# Patient Record
Sex: Male | Born: 1975 | Hispanic: No | Marital: Married | Smoking: Former smoker
Health system: Southern US, Community
[De-identification: ages and names within clinical notes are randomized; demographics above are authoritative.]

## PROBLEM LIST (undated history)

## (undated) DIAGNOSIS — E1165 Type 2 diabetes mellitus with hyperglycemia: Secondary | ICD-10-CM

## (undated) DIAGNOSIS — E785 Hyperlipidemia, unspecified: Secondary | ICD-10-CM

## (undated) DIAGNOSIS — Z5189 Encounter for other specified aftercare: Secondary | ICD-10-CM

## (undated) DIAGNOSIS — B3749 Other urogenital candidiasis: Secondary | ICD-10-CM

## (undated) DIAGNOSIS — N289 Disorder of kidney and ureter, unspecified: Secondary | ICD-10-CM

## (undated) DIAGNOSIS — E119 Type 2 diabetes mellitus without complications: Secondary | ICD-10-CM

## (undated) DIAGNOSIS — I10 Essential (primary) hypertension: Secondary | ICD-10-CM

## (undated) HISTORY — DX: Hyperlipidemia, unspecified: E78.5

## (undated) HISTORY — DX: Other urogenital candidiasis: B37.49

## (undated) HISTORY — DX: Essential (primary) hypertension: I10

## (undated) HISTORY — PX: OTHER SURGICAL HISTORY: SHX169

## (undated) HISTORY — PX: APPENDECTOMY: SHX54

## (undated) HISTORY — PX: ROTATOR CUFF REPAIR: SHX139

## (undated) HISTORY — DX: Type 2 diabetes mellitus without complications: E11.9

## (undated) HISTORY — DX: Type 2 diabetes mellitus with hyperglycemia: E11.65

---

## 2006-05-06 ENCOUNTER — Emergency Department (HOSPITAL_COMMUNITY): Admission: EM | Admit: 2006-05-06 | Discharge: 2006-05-06 | Payer: Self-pay | Admitting: Emergency Medicine

## 2006-06-30 ENCOUNTER — Encounter: Payer: Self-pay | Admitting: Pulmonary Disease

## 2006-07-08 ENCOUNTER — Ambulatory Visit: Payer: Self-pay | Admitting: Pulmonary Disease

## 2007-04-29 ENCOUNTER — Emergency Department (HOSPITAL_COMMUNITY): Admission: EM | Admit: 2007-04-29 | Discharge: 2007-04-29 | Payer: Self-pay | Admitting: Emergency Medicine

## 2007-06-23 ENCOUNTER — Emergency Department (HOSPITAL_COMMUNITY): Admission: EM | Admit: 2007-06-23 | Discharge: 2007-06-24 | Payer: Self-pay | Admitting: Emergency Medicine

## 2007-08-27 ENCOUNTER — Emergency Department (HOSPITAL_COMMUNITY): Admission: EM | Admit: 2007-08-27 | Discharge: 2007-08-28 | Payer: Self-pay | Admitting: Emergency Medicine

## 2007-12-15 ENCOUNTER — Encounter: Payer: Self-pay | Admitting: Family Medicine

## 2007-12-15 DIAGNOSIS — J309 Allergic rhinitis, unspecified: Secondary | ICD-10-CM | POA: Insufficient documentation

## 2007-12-15 DIAGNOSIS — R5381 Other malaise: Secondary | ICD-10-CM | POA: Insufficient documentation

## 2008-01-24 ENCOUNTER — Emergency Department (HOSPITAL_COMMUNITY): Admission: EM | Admit: 2008-01-24 | Discharge: 2008-01-24 | Payer: Self-pay | Admitting: Emergency Medicine

## 2008-06-30 ENCOUNTER — Emergency Department (HOSPITAL_COMMUNITY): Admission: EM | Admit: 2008-06-30 | Discharge: 2008-07-01 | Payer: Self-pay | Admitting: Emergency Medicine

## 2008-12-08 ENCOUNTER — Ambulatory Visit: Payer: Self-pay | Admitting: Pulmonary Disease

## 2008-12-08 DIAGNOSIS — E119 Type 2 diabetes mellitus without complications: Secondary | ICD-10-CM

## 2008-12-08 DIAGNOSIS — I10 Essential (primary) hypertension: Secondary | ICD-10-CM | POA: Insufficient documentation

## 2008-12-08 DIAGNOSIS — G4733 Obstructive sleep apnea (adult) (pediatric): Secondary | ICD-10-CM | POA: Insufficient documentation

## 2008-12-08 DIAGNOSIS — E1165 Type 2 diabetes mellitus with hyperglycemia: Secondary | ICD-10-CM | POA: Insufficient documentation

## 2008-12-08 HISTORY — DX: Type 2 diabetes mellitus without complications: E11.9

## 2008-12-08 HISTORY — DX: Essential (primary) hypertension: I10

## 2009-03-02 DIAGNOSIS — E1142 Type 2 diabetes mellitus with diabetic polyneuropathy: Secondary | ICD-10-CM | POA: Insufficient documentation

## 2009-03-11 ENCOUNTER — Telehealth: Payer: Self-pay | Admitting: Pulmonary Disease

## 2009-03-17 ENCOUNTER — Ambulatory Visit: Payer: Self-pay | Admitting: Family Medicine

## 2009-03-17 DIAGNOSIS — B3749 Other urogenital candidiasis: Secondary | ICD-10-CM | POA: Insufficient documentation

## 2009-03-17 DIAGNOSIS — IMO0001 Reserved for inherently not codable concepts without codable children: Secondary | ICD-10-CM

## 2009-03-17 DIAGNOSIS — E785 Hyperlipidemia, unspecified: Secondary | ICD-10-CM | POA: Insufficient documentation

## 2009-03-17 HISTORY — DX: Reserved for inherently not codable concepts without codable children: IMO0001

## 2009-03-17 HISTORY — DX: Hyperlipidemia, unspecified: E78.5

## 2009-03-17 HISTORY — DX: Other urogenital candidiasis: B37.49

## 2009-04-15 ENCOUNTER — Telehealth (INDEPENDENT_AMBULATORY_CARE_PROVIDER_SITE_OTHER): Payer: Self-pay | Admitting: *Deleted

## 2009-07-23 ENCOUNTER — Emergency Department (HOSPITAL_COMMUNITY): Admission: EM | Admit: 2009-07-23 | Discharge: 2009-07-23 | Payer: Self-pay | Admitting: Family Medicine

## 2010-03-02 ENCOUNTER — Telehealth: Payer: Self-pay | Admitting: Family Medicine

## 2010-03-06 ENCOUNTER — Telehealth: Payer: Self-pay | Admitting: Family Medicine

## 2010-03-24 ENCOUNTER — Ambulatory Visit: Payer: Self-pay | Admitting: Family Medicine

## 2010-03-28 ENCOUNTER — Telehealth: Payer: Self-pay | Admitting: Family Medicine

## 2010-03-28 ENCOUNTER — Telehealth (INDEPENDENT_AMBULATORY_CARE_PROVIDER_SITE_OTHER): Payer: Self-pay | Admitting: *Deleted

## 2010-03-31 ENCOUNTER — Telehealth: Payer: Self-pay | Admitting: Family Medicine

## 2010-04-04 ENCOUNTER — Telehealth: Payer: Self-pay | Admitting: Family Medicine

## 2010-04-06 ENCOUNTER — Ambulatory Visit: Payer: Self-pay | Admitting: Family Medicine

## 2010-04-18 ENCOUNTER — Telehealth: Payer: Self-pay | Admitting: Pulmonary Disease

## 2010-04-20 ENCOUNTER — Encounter: Payer: Self-pay | Admitting: Pulmonary Disease

## 2010-04-24 ENCOUNTER — Encounter: Payer: Self-pay | Admitting: Family Medicine

## 2010-06-01 ENCOUNTER — Ambulatory Visit (HOSPITAL_BASED_OUTPATIENT_CLINIC_OR_DEPARTMENT_OTHER): Admission: RE | Admit: 2010-06-01 | Discharge: 2010-06-01 | Payer: Self-pay | Admitting: Urology

## 2010-06-02 ENCOUNTER — Ambulatory Visit: Payer: Self-pay | Admitting: Family Medicine

## 2010-06-02 LAB — CONVERTED CEMR LAB
ALT: 24 units/L (ref 0–53)
AST: 35 units/L (ref 0–37)
Albumin: 3.6 g/dL (ref 3.5–5.2)
Alkaline Phosphatase: 99 units/L (ref 39–117)
Cholesterol: 236 mg/dL — ABNORMAL HIGH (ref 0–200)
Creatinine, Ser: 0.8 mg/dL (ref 0.4–1.5)
Direct LDL: 119.8 mg/dL
GFR calc non Af Amer: 122.78 mL/min (ref >60)
Glucose, Bld: 289 mg/dL — ABNORMAL HIGH (ref 70–99)
Sodium: 136 meq/L (ref 135–145)
Total CHOL/HDL Ratio: 7

## 2010-06-08 ENCOUNTER — Encounter: Payer: Self-pay | Admitting: Family Medicine

## 2010-06-12 ENCOUNTER — Ambulatory Visit: Payer: Self-pay | Admitting: Family Medicine

## 2010-06-12 LAB — HM DIABETES FOOT EXAM

## 2010-06-29 ENCOUNTER — Ambulatory Visit: Payer: Self-pay | Admitting: Endocrinology

## 2010-07-20 ENCOUNTER — Encounter: Payer: Self-pay | Admitting: Family Medicine

## 2010-07-28 ENCOUNTER — Ambulatory Visit: Payer: Self-pay | Admitting: Endocrinology

## 2010-12-19 NOTE — Letter (Signed)
Summary: [facility]  [facility]   Imported By: Lanelle Bal 07/27/2010 12:12:12  _____________________________________________________________________  External Attachment:    Type:   Image     Comment:   External Document

## 2010-12-19 NOTE — Consult Note (Signed)
Summary: [facility]  [facility]   Imported By: Maryln Gottron 05/01/2010 14:21:44  _____________________________________________________________________  External Attachment:    Type:   Image     Comment:   External Document

## 2010-12-19 NOTE — Progress Notes (Signed)
Summary: nos appt  Phone Note Call from Patient   Caller: [email or web address]  Call For: clance Summary of Call: LMTCB x2 to rsch nos from 5/27. Initial call taken by: Darletta Moll,  Apr 18, 2010 3:11 PM

## 2010-12-19 NOTE — Progress Notes (Signed)
Summary: cpap questions   [id number]  Phone Note Call from Patient Call back at [phone]   Caller: wife-jessica Call For: clance Summary of Call: has question about his cpap and also needs rx for his medical supplies Initial call taken by: Lacinda Axon,  Mar 28, 2010 8:14 AM  Follow-up for Phone Call        [id number].  FYI...Marland KitchenMarland KitchenPt needs ov with KC before he will sign any rx for supplies .  pt was last seen Jan 2010.  Arman Filter LPN  Mar 28, 2010 8:57 AM   called and spoke with pt's wife.  wife scheduled pt for yearly f/u appt with kc for 04/14/2010 at 3:45pm.   Wife also states that last year when pt was seen in Jan, KC had ordered an Soil scientist.  Wife states she had called multiple times for these results and never heard back from [place].  Wife would like the auto download results.  Wife also would like an order sent to [facility] for cpap supplies.  Will forward message to [facility].  Aundra Millet Reynolds LPN  Mar 28, 2010 9:25 AM ** I called [facility] and got download from 2010 and put in [place] very important look at folder.   Additional Follow-up for Phone Call Additional follow up Details #1::        let pt know that we never received these results until now.  We had called ourselves to get them sent with no success.  let them know download shows optimal pressure of 12-13 cm of pressure, and that he wore for 4 hours or more only 50% of the nights monitored. will send order for supplies to [facility], but they MUST keep f/u apptm with me.  Additional Follow-up by: Barbaraann Share MD,  Mar 28, 2010 11:56 AM    Additional Follow-up for Phone Call Additional follow up Details #2::    called and spoke with pt's spouse, [name], and informed her of KC's response and recs.  Wife verbalized understanding and denied any questions.  Arman Filter LPN  Mar 28, 2010 12:10 PM

## 2010-12-19 NOTE — Assessment & Plan Note (Signed)
Summary: 2 month rov/njr/PT RESCD///CCM wife rsc/njr   Vital Signs:  Patient profile:   35 year old male Weight:      244 pounds Temp:     98.0 degrees F oral BP sitting:   110 / 80  (left arm) Cuff size:   large  Vitals Entered By: Kathrynn Speed CMA (June 12, 2010 4:35 PM) CC: 2 mth fu on BS, src   History of Present Illness: Patient has diabetes poorly controlled. He is currently on NovoLog mix 70/30 100 units in morning and 60 units at night. His blood sugar seemed to level off with fastings ranging 210-225 and pre-dinner sugars also mostly low 200s. He has had some improvement in terms of less urine frequency and thirst. No hypoglycemic symptoms. Patient also remains on metformin 1000 mg b.i.d. He has made some positive dietary changes.    Compared with few months ago he feels better overall but is somewhat frustrated that his blood sugars are better controlled on higher dose insulin.  No hypoglycemia.  Compliant with all meds.  Recent labs reviewed-A1C 11.8% and lipids are not to goal.  Surgery 2 weeks ago for circumcision for recurrent yeast balanitits and healing well.  Current Medications (verified): 1)  Meloxicam 15 Mg Tabs (Meloxicam) .... Take 1 Tablet By Mouth Once A Day 2)  Pravastatin Sodium 40 Mg Tabs (Pravastatin Sodium) .... Take 1 Tablet By Mouth Once A Day 3)  Metformin Hcl 1000 Mg Tabs (Metformin Hcl) .... One Tab Two Times A Day 4)  Cvs Aspirin 325 Mg Tabs (Aspirin) .... Once Daily 5)  Nystatin 100000 Unit/gm Crea (Nystatin) .... Apply To Affected Rash Two Times A Day As Needed 6)  Lisinopril-Hydrochlorothiazide 10-12.5 Mg Tabs (Lisinopril-Hydrochlorothiazide) .... One By Mouth Daily 7)  Easy Touch Pen Needles 31g X 8 Mm Misc (Insulin Pen Needle) .... Use As Directed Once Daily 8)  Novolog Mix 70/30 Flexpen 70-30 % Susp (Insulin Aspart Prot & Aspart) .... 70 Units [place] 15 Minutes Prior To Breakfast and 35 Units [place] 15 Minutes Prior To Supper  Allergies  (verified): No Known Drug Allergies  Past History:  Past Surgical History: Last updated: 03/17/2009 Appendectomy  Family History: Last updated: 03/17/2009 heart disease: father  Family History of Arthritis Family History High cholesterol Family History Hypertension Diabetes Heart disease Breast cancer  Social History: Last updated: 03/17/2009 pt is married with children. 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    Physical Exam  General:  Well-developed,well-nourished,in no acute distress; alert,appropriate and cooperative throughout examination Mouth:  Oral mucosa and oropharynx without lesions or exudates.  Teeth in good repair. Neck:  No deformities, masses, or tenderness noted. Lungs:  Normal respiratory effort, chest expands symmetrically. Lungs are clear to auscultation, no crackles or wheezes. Heart:  Normal rate and regular rhythm. S1 and S2 normal without gallop, murmur, click, rub or other extra sounds.  Diabetes Management Exam:    Foot Exam (with socks and/or shoes not present):       Sensory-Pinprick/Light touch:          Left medial foot (L-4): normal  Left dorsal foot (L-5): normal          Left lateral foot (S-1): normal          Right medial foot (L-4): normal          Right dorsal foot (L-5): normal          Right lateral foot (S-1): normal       Sensory-Monofilament:          Left foot: normal          Right foot: normal       Inspection:           Left foot: normal          Right foot: normal       Nails:          Left foot: normal          Right foot: normal   Impression & Recommendations:  Problem # 1:  DIABETES MELLITUS, TYPE II, UNCONTROLLED (ICD-250.02)  at this point have recommended endocrinology referral given his high dose of insulin and poor control. He agrees with this plan His updated medication list for this problem includes:    Metformin Hcl 1000 Mg Tabs (Metformin hcl) ..... One tab two times a day    Cvs Aspirin 325 Mg Tabs (Aspirin) ..... Once daily    Lisinopril-hydrochlorothiazide 10-12.5 Mg Tabs (Lisinopril-hydrochlorothiazide) ..... One by mouth daily    Novolog Mix 70/30 Flexpen 70-30 % Susp (Insulin aspart prot & aspart) .Marland KitchenMarland KitchenMarland KitchenMarland Kitchen 70 units [place] 15 minutes prior to breakfast and 35 units [place] 15 minutes prior to supper  Orders: Endocrinology Referral (Endocrine)  Problem # 2:  HYPERLIPIDEMIA (ICD-272.4) titrate pravastatin to 80 mg His updated medication list for this problem includes:    Pravastatin Sodium 80 Mg Tabs (Pravastatin sodium) ..... One by mouth once daily  Complete Medication List: 1)  Meloxicam 15 Mg Tabs (Meloxicam) .... Take 1 tablet by mouth once a day 2)  Pravastatin Sodium 80 Mg Tabs (Pravastatin sodium) .... One by mouth once daily 3)  Metformin Hcl 1000 Mg Tabs (Metformin hcl) .... One tab two times a day 4)  Cvs Aspirin 325 Mg Tabs (Aspirin) .... Once daily 5)  Nystatin 100000 Unit/gm Crea (Nystatin) .... Apply to affected rash two times a day as needed 6)  Lisinopril-hydrochlorothiazide 10-12.5 Mg Tabs (Lisinopril-hydrochlorothiazide) .... One by mouth daily 7)  Easy Touch Pen Needles 31g X 8 Mm Misc (Insulin pen needle) .... Use as directed once daily 8)  Novolog Mix 70/30 Flexpen 70-30 % Susp (Insulin aspart prot & aspart) .... 70 units [place] 15 minutes prior to breakfast and 35 units [place] 15 minutes prior to supper  Patient Instructions: 1)  Please schedule a follow-up appointment in 3  months .  2)  Check your blood sugars regularly. If your readings are usually above:200  or below 70 you should contact our office.  3)  It is important that your diabetic A1c level is checked every 3 months.  4)  See your eye doctor yearly to check for diabetic eye damage. 5)  Check your feet each night  for sore areas, calluses or signs of infection.  Prescriptions: PRAVASTATIN SODIUM 80 MG TABS (PRAVASTATIN SODIUM) one by mouth once daily  #30 x 6   Entered and Authorized by:   Evelena Peat MD   Signed by:   Evelena Peat MD on 06/12/2010   Method used:   Electronically to  [facility]ail[facility]       [address]       [place], [place]  [address]       Ph: [phone]       Fax: [phone]   RxID:   [id number]

## 2010-12-19 NOTE — Progress Notes (Signed)
Summary: Pt req script for [facility]antus [facility] to [facility] [address]  Phone Note Call from Patient Call back at [phone] Jessica   Caller: spouse-Jessica Summary of Call: Pts wife called and said that pts script for his insulin, Lantus pen, has run out. Please call in to [facility] [address].     Initial call taken by: Lucy Ant[place],  March 02, 2010 3:40 PM  Follow-up for Phone Call        Spoke with wife, explained her husband has not seen Dr Caryl Never in a year, was a no show for his 1 month follow-up.  Pt is totally out of Lantus.  She explained they lost their insurance.  His BS have been running 300 and he is taking 70 units two times a day.  He is totally out of insulin.  Wife is waiting insurance cards to schedule OV as they recently are eligable for insurance again. Follow-up by: Sid Falcon LPN,  March 02, 2010 4:49 PM  Additional Follow-up for Phone Call Additional follow up Details #1::        Wife informed Lantus was sent to [facility], please call to schedule OV for husband. FYI Dr Caryl Never Additional Follow-up by: Sid Falcon LPN,  March 02, 2010 4:55 PM    Prescriptions: LANTUS 100 UNIT/ML SOLN (INSULIN GLARGINE) 20 units at bedtime  #3 x 0   Entered by:   Sid Falcon LPN   Authorized by:   Evelena Peat MD   Signed by:   Sid Falcon LPN on 62/95/2841   Method used:   Electronically to        [facility] (retail)       [address]       [place], [place]  [address]       Ph: [phone]       Fax: [phone]   RxID:   [id number]

## 2010-12-19 NOTE — Letter (Signed)
Summary: [facility]  [facility]   Imported By: Maryln Gottron 06/14/2010 09:51:54  _____________________________________________________________________  External Attachment:    Type:   Image     Comment:   External Document

## 2010-12-19 NOTE — Assessment & Plan Note (Signed)
Summary: NEW ENDO DMII-MED COST PT-#--PKG-PER RHONDA/DR BURCHETTE-STC   Vital Signs:  Patient profile:   35 year old male Height:      70 inches (177.80 cm) Weight:      248.50 pounds (112.95 kg) BMI:     35.78 O2 Sat:      97 % on Room air Temp:     97.3 degrees F (36.28 degrees C) oral Pulse rate:   88 / minute BP sitting:   130 / 70  (left arm) Cuff size:   large  Vitals Entered By: Brenton Grills MA (June 29, 2010 2:26 PM)  O2 Flow:  Room air CC: New Endo pt/DMII/Dr. Burchette/aj Is Patient Diabetic? Yes   Primary Delle Andrzejewski:  Elizabeth Palau  CC:  New Endo pt/DMII/Dr. Burchette/aj.  History of Present Illness: pt states 4 years h/o dm.  he is unaware of any chronic complications.  he has been on insulin x 18 mos.  he takes novolog 70/30, 100 units two times a day, and metformin.  no cbg record, but states cbg's are 200's despite the increases of his insulin.    it is in general higher as the day goes on. pt says his diet and exercise are both "good."   symptomatically, pt states few years of recurrent itching of the penis, and associated rash.  it is better with circumcision.   Current Medications (verified): 1)  Meloxicam 15 Mg Tabs (Meloxicam) .... Take 1 Tablet By Mouth Once A Day 2)  Pravastatin Sodium 80 Mg Tabs (Pravastatin Sodium) .... One By Mouth Once Daily 3)  Metformin Hcl 1000 Mg Tabs (Metformin Hcl) .... One Tab Two Times A Day 4)  Cvs Aspirin 325 Mg Tabs (Aspirin) .... Once Daily 5)  Nystatin 100000 Unit/gm Crea (Nystatin) .... Apply To Affected Rash Two Times A Day As Needed 6)  Lisinopril-Hydrochlorothiazide 10-12.5 Mg Tabs (Lisinopril-Hydrochlorothiazide) .... One By Mouth Daily 7)  Easy Touch Pen Needles 31g X 8 Mm Misc (Insulin Pen Needle) .... Use As Directed Once Daily 8)  Novolog Mix 70/30 Flexpen 70-30 % Susp (Insulin Aspart Prot & Aspart) .... 70 Units [place] 15 Minutes Prior To Breakfast and 35 Units [place] 15 Minutes Prior To Supper  Allergies  (verified): No Known Drug Allergies  Past History:  Past Medical History: Last updated: 06/12/2010 Current Problems:  OBSTRUCTIVE SLEEP APNEA (ICD-327.23) DM (ICD-250.00) HYPERTENSION (ICD-401.9)  HYPERLIPIDEMIA  Family History: Reviewed history from 03/17/2009 and no changes required. heart disease: father  Family History of Arthritis Family History High cholesterol Family History Hypertension Diabetes:  both parents Heart disease Breast cancer  Social History: Reviewed history from 03/17/2009 and no changes required. pt is married with children. 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  he has lost 75 lbs x 2 years.   Physical Exam  General:  normal appearance.   Head:  head: no deformity eyes: no periorbital swelling, no proptosis external nose and ears are normal mouth: no lesion seen Neck:  Supple without thyroid enlargement or tenderness.  Lungs:  Clear to auscultation bilaterally. Normal respiratory effort.  Heart:  Regular rate and rhythm without murmurs or gallops noted. Normal S1,S2.   Abdomen:  abdomen is soft, nontender.  no hepatosplenomegaly.   not distended.  no hernia  Msk:  muscle bulk and strength are grossly normal.  no obvious joint swelling.  gait is normal and steady  Pulses:  dorsalis pedis intact bilat.  no carotid bruit  Extremities:  no deformity.  no ulcer on the feet.  feet are of normal color and temp.  no edema  Neurologic:  cn 2-12 grossly intact.   readily moves all 4's.   sensation is intact to touch on the feet  Skin:  normal texture and temp.  no rash.  not diaphoretic  Cervical Nodes:  No significant adenopathy.  Psych:  Alert and cooperative; normal mood and affect; normal attention span and concentration.      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 125 units with breakfast, and 100 untis with evening meal, and pen needles two times a day  Other Orders: Consultation Level IV (16[address])  Patient Instructions: 1)  good diet and exercise habits significanly improve the control of your diabetes.  please let me know if you wish to be referred to a dietician.  you should also consider weight-loss surgery.  high blood sugar is very risky to your health.  you should see an eye doctor every year. 2)  controlling your blood pressure and cholesterol drastically reduces the damage diabetes does to your body.  this also applies to quitting smoking.  please discuss these with your doctor.  you should take an aspirin every day, unless you have been advised by a doctor not to. 3)  we will need to take this complex situation in stages 4)  check your blood sugar 2 times a day.  vary the time of day when you check, between before the 3 meals, and at bedtime.  also check if you have symptoms of your blood sugar being too high or too low.  please keep a record of the readings and bring it to your next appointment here.  please call us sooner if you are having low blood sugar episodes. 5)  for now, increase insulin to 125 units am and 100 units pm.  if blood sugar does not come down to the low-100's at some time of day on this, increase to 150 units am and 100 units pm. 6)  Please schedule a follow-up appointment in 3 weeks. Prescriptions: NOVOLOG MIX 70/30 FLEXPEN 70-30 % SUSP (INSULIN ASPART PROT & ASPART) 125 units with breakfast, and 100 untis with evening meal, and pen needles two times a day  #6 boxes x 11    Entered and Authorized by:   Minus Breeding MD   Signed by:   Minus Breeding MD on 06/29/2010   Method used:   Electronically to        [facility] [place] [facility] (retail)       [address]       [place], [place]  [address]       Ph: [phone]       Fax: [phone]   RxID:   [id number]

## 2010-12-19 NOTE — Assessment & Plan Note (Signed)
Summary: RENEW MEDS//CCM   Vital Signs:  Patient profile:   35 year old male Height:      70 inches Weight:      245 pounds BMI:     35.28 Temp:     98.0 degrees F oral Pulse rate:   96 / minute Pulse rhythm:   regular Resp:     12 per minute BP sitting:   140 / 90  (left arm) Cuff size:   large  Vitals Entered By: Sid Falcon LPN (Mar 24, 8118 4:09 PM)  Nutrition Counseling: Patient's BMI is greater than 25 and therefore counseled on weight management options. CC: Med refills Is Patient Diabetic? Yes Did you bring your meter with you today? No   History of Present Illness: Patient not seen in over one year. Ran out of insurance and recently has coverage.  History of poorly controlled type 2 diabetes, obstructive sleep apnea, hyperlipidemia, hypertension, and recurrent yeast balanitis.  Regarding diabetes, we started Actos last year but he only took samples for about a month and then discontinued. Currently taking glipizide and that form and along with Lantus 80 units twice daily still with poor sugar control with some fastings over 400 and frequent over 300. Frequent malaise. No hypoglycemia. Each regular meals. No eye exam.  History hyperlipidemia treated Pravachol. Out of bed for several months.  History hypertension treated lisinopril HCTZ. Out of medication for 2 weeks. Denies any recent chest pain, dizziness, or palpitations.  Recurrent yeast balanitis. Uncircumcised. Frequently has swelling of foreskin and frequently this splits with erection and patient is requesting urology referral.  Preventive Screening-Counseling & Management  Alcohol-Tobacco     Smoking Status: current     Packs/Day: 0.75     Year Started: 1997  Allergies: No Known Drug Allergies  Past History:  Past Medical History: Last updated: 03/17/2009 Current Problems:  OBSTRUCTIVE SLEEP APNEA (ICD-327.23) DM (ICD-250.00) HYPERTENSION (ICD-401.9)   Hyperlipidemia  Past Surgical  History: Last updated: 03/17/2009 Appendectomy  Family History: Last updated: 03/17/2009 heart disease: father  Family History of Arthritis Family History High cholesterol Family History Hypertension Diabetes Heart disease Breast cancer  Social History: Last updated: 03/17/2009 pt is married with children. Occupation:  Truck driver Married Current Smoker  Risk Factors: Smoking Status: current (03/24/2010) Packs/Day: 0.75 (03/24/2010)  Social History: Packs/Day:  0.75  Review of Systems  The patient denies anorexia, fever, weight loss, weight gain, chest pain, syncope, dyspnea on exertion, peripheral edema, prolonged cough, headaches, hemoptysis, abdominal pain, melena, hematochezia, severe indigestion/heartburn, and muscle weakness.    Physical Exam  General:  Well-developed,well-nourished,in no acute distress; alert,appropriate and cooperative throughout examination Neck:  No deformities, masses, or tenderness noted. Lungs:  Normal respiratory effort, chest expands symmetrically. Lungs are clear to auscultation, no crackles or wheezes. Heart:  Normal rate and regular rhythm. S1 and S2 normal without gallop, murmur, click, rub or other extra sounds. Extremities:  No clubbing, cyanosis, edema, or deformity noted with normal full range of motion of all joints.     Impression & Recommendations:  Problem # 1:  DIABETES MELLITUS, TYPE II, UNCONTROLLED (ICD-250.02) several med changes made. Discontinue glipizide. Start NovoLog 70/30 mix. 60 units morning 30 units at night and will likely need titrated upwards. Patient needs eye exam. Poor dietary compliance and we've discussed dietary issues. Transport planner given.  Monitor CBGs two times a day to three times a day and 2 week f/u and review at that time. The following medications were removed from  the medication list:    Glipizide 10 Mg Xr24h-tab (Glipizide) .Marland Kitchen... Take 1 tablet by mouth two times a day    Lantus  Solostar 100 Unit/ml Soln (Insulin glargine) .Marland Kitchen... 20 ml at bedtime    Actos 30 Mg Tabs (Pioglitazone hcl) ..... One by mouth daily His updated medication list for this problem includes:    Metformin Hcl 1000 Mg Tabs (Metformin hcl) ..... One tab two times a day    [facility] Aspirin 325 Mg Tabs (Aspirin) ..... Once daily    Lisinopril-hydrochlorothiazide 10-12.5 Mg Tabs (Lisinopril-hydrochlorothiazide) ..... One by mouth daily    Novolog Mix 70/30 Flexpen 70-30 % Susp (Insulin aspart prot & aspart) .Marland KitchenMarland KitchenMarland KitchenMarland Kitchen 60 units [place] 15 minutes prior to breakfast and 30 units [place] 15 minutes prior to supper  Problem # 2:  HYPERLIPIDEMIA (ICD-272.4) start back pravastatin and needs followup lipids in about 2 months His updated medication list for this problem includes:    Pravastatin Sodium 40 Mg Tabs (Pravastatin sodium) .Marland Kitchen... Take 1 tablet by mouth once a day  Problem # 3:  HYPERTENSION (ICD-401.9) get back on lisinopril HCTZ His updated medication list for this problem includes:    Lisinopril-hydrochlorothiazide 10-12.5 Mg Tabs (Lisinopril-hydrochlorothiazide) ..... One by mouth daily  Problem # 4:  YEAST BALANITIS (ICD-112.2)  patient request urology referral for consideration of circumcision  Orders: Urology Referral (Urology)  Complete Medication List: 1)  Meloxicam 15 Mg Tabs (Meloxicam) .... Take 1 tablet by mouth once a day 2)  Pravastatin Sodium 40 Mg Tabs (Pravastatin sodium) .... Take 1 tablet by mouth once a day 3)  Metformin Hcl 1000 Mg Tabs (Metformin hcl) .... One tab two times a day 4)  [facility] Aspirin 325 Mg Tabs (Aspirin) .... Once daily 5)  Nystatin 100000 Unit/gm Crea (Nystatin) .... Apply to affected rash two times a day as needed 6)  Lisinopril-hydrochlorothiazide 10-12.5 Mg Tabs (Lisinopril-hydrochlorothiazide) .... One by mouth daily 7)  Easy Touch Pen Needles 31g X 8 Mm Misc (Insulin pen needle) .... Use as directed once daily 8)  Novolog Mix 70/30 Flexpen 70-30 % Susp (Insulin aspart  prot & aspart) .... 60 units [place] 15 minutes prior to breakfast and 30 units [place] 15 minutes prior to supper  Patient Instructions: 1)  Please schedule a follow-up appointment in 2 weeks.  2)  Check your blood sugars regularly. If your readings are usually above: 300 or below 70 you should contact our office.  3)  It is important that your diabetic A1c level is checked every 3 months.  4)  See your eye doctor yearly to check for diabetic eye damage. 5)  Check your feet each night  for sore areas, calluses or signs of infection.  Prescriptions: LISINOPRIL-HYDROCHLOROTHIAZIDE 10-12.5 MG TABS (LISINOPRIL-HYDROCHLOROTHIAZIDE) one by mouth daily  #30 x 5   Entered and Authorized by:   Evelena Peat MD   Signed by:   Evelena Peat MD on 03/24/2010   Method used:   Electronically to        [facility] [address] [address] (retail)       [address]       [place], [place]  [address]       Ph: [phone]       Fax: [phone]   RxID:   [id number] METFORMIN HCL 1000 MG TABS (METFORMIN HCL) one tab two times a day  #60 x 5   Entered and Authorized by:   Evelena Peat MD  Signed by:   Evelena Peat MD on 03/24/2010   Method used:   Electronically to        [facility] ([facility])       [address]       [place], [place]  [address]       Ph: [phone]       Fax: [phone]   RxID:   [id number] PRAVASTATIN SODIUM 40 MG TABS (PRAVASTATIN SODIUM) Take 1 tablet by mouth once a day  #30 x 5   Entered and Authorized by:   Evelena Peat MD   Signed by:   Evelena Peat MD on 03/24/2010   Method used:   Electronically to        [facility] ([facility])       [address]       [place], [place]  [address]       Ph: [phone]       Fax: [phone]   RxID:   [id number] MELOXICAM 15 MG TABS (MELOXICAM) Take 1 tablet by mouth once a day  #30 x 1   Entered and Authorized by:   Evelena Peat MD   Signed by:   Evelena Peat MD on 03/24/2010   Method used:   Electronically to        [facility] [address] [address] (retail)       [address]       [place], [place]  [address]       Ph: [phone]       Fax: [phone]   RxID:   [id number] NOVOLOG MIX 70/30 FLEXPEN 70-30 % SUSP (INSULIN ASPART PROT & ASPART) 60 units [place] 15 minutes prior to breakfast and 30 units [place] 15 minutes prior to supper  #5 syringes x 5   Entered and Authorized by:   Evelena Peat MD   Signed by:   Evelena Peat MD on 03/24/2010   Method used:   Electronically to        [facility] ([facility])       [address]       [place], [place]  [address]       Ph: [phone]       Fax: [phone]   RxID:   [id number] NYSTATIN 100000 UNIT/GM CREA (NYSTATIN) apply to affected rash two times a day as needed  #30 gm x 3   Entered and Authorized by:   Evelena Peat MD   Signed by:   Evelena Peat MD on 03/24/2010   Method used:   Electronically to        [facility] ([facility])       [address]       [place], [place]  [address]       Ph: [phone]       Fax: [phone]   RxID:   [id number]

## 2010-12-19 NOTE — Progress Notes (Signed)
Summary: need Solarstar Pens  Phone Note Call from Patient Call back at [phone]   Caller: Spouse----live call Summary of Call: pt needs the Lantus Solarstar insulin  pens instead of the needles. call [facility] [address] Initial call taken by: Warnell Forester,  March 06, 2010 11:14 AM  Follow-up for Phone Call        Spoke to pt, changed for Lantus in vial to Lantus solarstar pens and needles ordered. Follow-up by: Sid Falcon LPN,  March 06, 2010 12:47 PM    New/Updated Medications: LANTUS SOLOSTAR 100 UNIT/ML SOLN (INSULIN GLARGINE) 20 ml at bedtime EASY TOUCH PEN NEEDLES 31G X 8 MM MISC (INSULIN PEN NEEDLE) use as directed once daily Prescriptions: EASY TOUCH PEN NEEDLES 31G X 8 MM MISC (INSULIN PEN NEEDLE) use as directed once daily  #100 x 3   Entered by:   Sid Falcon LPN   Authorized by:   Evelena Peat MD   Signed by:   Sid Falcon LPN on 45/40/9811   Method used:   Electronically to        [facility] ([facility])       [address]       [place], [place]  [address]       Ph: [phone]       Fax: [phone]   RxID:   [id number] LANTUS SOLOSTAR 100 UNIT/ML SOLN (INSULIN GLARGINE) 20 ml at bedtime  #3 x 6   Entered by:   Sid Falcon LPN   Authorized by:   Evelena Peat MD   Signed by:   Sid Falcon LPN on 66/44/0347   Method used:   Electronically to        [facility] ([facility])       [address]       [place], [place]  [address]       Ph: [phone]       Fax: [phone]   RxID:   [id number]

## 2010-12-19 NOTE — Progress Notes (Signed)
Summary: BS report  Phone Note Call from Patient   Caller: Elyse Hsu [phone] Call For: Evelena Peat MD Summary of Call: BS report 5/10 FBS 227, AC 283 5/11 FBS 268, AC 193 5/12 FBS 230, AC 329 5/13 FBS 217 Initial call taken by: Sid Falcon LPN,  Mar 31, 2010 9:38 AM  Follow-up for Phone Call        Sugars reviewed.   Increase 70/30 insulin to 75 units AM and 40 units PM.  Make sure pt has scheduled f/u next couple of weeks to reassess. Follow-up by: Evelena Peat MD,  Mar 31, 2010 1:16 PM  Additional Follow-up for Phone Call Additional follow up Details #1::        Wife informed, pt scheduled next week for F/U Additional Follow-up by: Sid Falcon LPN,  Mar 31, 2010 1:41 PM

## 2010-12-19 NOTE — Progress Notes (Signed)
Summary: BS report  Phone Note Call from Patient   Caller: wife,jessica,[phone] Summary of Call: 5-7 FBS 324 pm, ac dinner 380 on 60units am & 30units pm, 5-8 FBS 238 & ac dinner 184 on 62 units am & 30 pm, 5-9 FBS 292 & ac dinner 357 & 3hr pc 239 on 62 units am & 32 pm, 5-10 FBS 227 on 66 units.    Initial call taken by: Rudy Jew, RN,  Mar 28, 2010 8:07 AM  Follow-up for Phone Call        Go ahead and increase 70/30 mix to 70 units AM and 35 units PM. Follow-up by: Evelena Peat MD,  Mar 28, 2010 10:00 AM  Additional Follow-up for Phone Call Additional follow up Details #1::        Phone Call Completed Additional Follow-up by: Rudy Jew, RN,  Mar 28, 2010 10:32 AM    New/Updated Medications: NOVOLOG MIX 70/30 FLEXPEN 70-30 % SUSP (INSULIN ASPART PROT & ASPART) 70 units [place] 15 minutes prior to breakfast and 35 units [place] 15 minutes prior to supper

## 2010-12-19 NOTE — Letter (Signed)
Summary: CMN request for CPAP Supplies/[facility]  CMN request for CPAP Supplies/[facility]   Imported By: Sherian Rein 04/28/2010 11:24:21  _____________________________________________________________________  External Attachment:    Type:   Image     Comment:   External Document

## 2010-12-19 NOTE — Progress Notes (Signed)
Summary: please return call, reporting BS  Phone Note Call from Patient Call back at [phone]   Caller: Spouse---live call Call For: Evelena Peat MD Summary of Call: Waldo Laine to call back for bs readings: 5:14 am was 247 on 75 units of the new insulin. on 5:15 pm 218 with 40 units. 5:16 am 296 on 75 units , 5:16 pm was 216 on 40 units ,5;16 am was 200 on 75 units. the readings were recorded starting on may 14 th. Initial call taken by: Warnell Forester,  Apr 04, 2010 8:10 AM  Follow-up for Phone Call        Spoke with pt wife, BS not really improving with Insulin increase.  Pt has OV on Thursday afternoon.  Follow-up by: Sid Falcon LPN,  Apr 04, 2010 3:40 PM  Additional Follow-up for Phone Call Additional follow up Details #1::        continue same dose until office follow up. Additional Follow-up by: Evelena Peat MD,  Apr 04, 2010 10:13 PM    Additional Follow-up for Phone Call Additional follow up Details #2::    Mrs Hedman informed, OV tomorrow Follow-up by: Sid Falcon LPN,  Apr 05, 2010 11:36 AM

## 2010-12-19 NOTE — Assessment & Plan Note (Signed)
Summary: 2 wk rov/njr   Vital Signs:  Patient profile:   35 year old male Weight:      248 pounds Temp:     98 degrees F oral BP sitting:   120 / 90  (left arm) Cuff size:   large  Vitals Entered By: Sid Falcon LPN (Apr 06, 2010 4:48 PM) CC: 2 week follow-up, diabetes management   History of Present Illness: F/U poorly controlled type 2 diabetes. Recent initiation insulin NovoLog 70/30 mix.  patient currently 70 units morning 40 units night. Blood sugars had been ranging over 400 currently mostly low 200s and occasional readings mid 100s. No hypoglycemia. Remains on metformin 1000 mg b.i.d. Symptoms of hyperglycemia including thirst and urine frequency are improving. Overall improved energy.  Making important dietary changes. Has reduced sugar consumption mostly in the form of colas since last visit.  Allergies (verified): No Known Drug Allergies  Past History:  Past Medical History: Last updated: 03/17/2009 Current Problems:  OBSTRUCTIVE SLEEP APNEA (ICD-327.23) DM (ICD-250.00) HYPERTENSION (ICD-401.9)   Hyperlipidemia  Review of Systems  The patient denies anorexia, fever, chest pain, syncope, dyspnea on exertion, peripheral edema, prolonged cough, headaches, hemoptysis, and abdominal pain.    Physical Exam  General:  Well-developed,well-nourished,in no acute distress; alert,appropriate and cooperative throughout examination Mouth:  Oral mucosa and oropharynx without lesions or exudates.  Teeth in good repair. Lungs:  Normal respiratory effort, chest expands symmetrically. Lungs are clear to auscultation, no crackles or wheezes. Heart:  Normal rate and regular rhythm. S1 and S2 normal without gallop, murmur, click, rub or other extra sounds.   Impression & Recommendations:  Problem # 1:  DIABETES MELLITUS, TYPE II, UNCONTROLLED (ICD-250.02) Assessment Improved titrate NovoLog mix to 75 units morning 45 units night and continue close monitoring. Reassess 2  months and repeat A1c with other labs and then His updated medication list for this problem includes:    Metformin Hcl 1000 Mg Tabs (Metformin hcl) ..... One tab two times a day    [facility] Aspirin 325 Mg Tabs (Aspirin) ..... Once daily    Lisinopril-hydrochlorothiazide 10-12.5 Mg Tabs (Lisinopril-hydrochlorothiazide) ..... One by mouth daily    Novolog Mix 70/30 Flexpen 70-30 % Susp (Insulin aspart prot & aspart) .Marland KitchenMarland KitchenMarland KitchenMarland Kitchen 70 units [place] 15 minutes prior to breakfast and 35 units [place] 15 minutes prior to supper  Complete Medication List: 1)  Meloxicam 15 Mg Tabs (Meloxicam) .... Take 1 tablet by mouth once a day 2)  Pravastatin Sodium 40 Mg Tabs (Pravastatin sodium) .... Take 1 tablet by mouth once a day 3)  Metformin Hcl 1000 Mg Tabs (Metformin hcl) .... One tab two times a day 4)  [facility] Aspirin 325 Mg Tabs (Aspirin) .... Once daily 5)  Nystatin 100000 Unit/gm Crea (Nystatin) .... Apply to affected rash two times a day as needed 6)  Lisinopril-hydrochlorothiazide 10-12.5 Mg Tabs (Lisinopril-hydrochlorothiazide) .... One by mouth daily 7)  Easy Touch Pen Needles 31g X 8 Mm Misc (Insulin pen needle) .... Use as directed once daily 8)  Novolog Mix 70/30 Flexpen 70-30 % Susp (Insulin aspart prot & aspart) .... 70 units [place] 15 minutes prior to breakfast and 35 units [place] 15 minutes prior to supper  Patient Instructions: 1)  Please schedule a follow-up appointment in 2 months.  2)  Hepatic Panel prior to visit ICD-9: 272.4 3)  Lipid panel prior to visit ICD-9 : 272.4 4)  HgBA1c prior to visit  ICD-9: 250.02 5)  Urine Microalbumin prior to visit ICD-9 : 250.02  6)  BMP prior to visit, ICD-9: 401.9 7)  Check your blood sugars regularly. If your readings are usually above:  or below 70 you should contact our office.  8)  See your eye doctor yearly to check for diabetic eye damage. 9)  Check your feet each night  for sore areas, calluses or signs of infection.  10)  increase NovoLog to 75 units each morning and 45  units at night

## 2010-12-22 ENCOUNTER — Telehealth: Payer: Self-pay | Admitting: Endocrinology

## 2011-01-04 NOTE — Progress Notes (Signed)
Summary: OV due  Phone Note Outgoing Call Call back at Work Phone [phone]   Call placed by: Brenton Grills CMA Duncan Dull),  December 22, 2010 2:12 PM Call placed to: Patient Details for Reason: OV due Summary of Call: Per MD, pt is due for F/U OV for DM, last OV 06/2010, left message for pt to callback office  Follow-up for Phone Call        left detailed message on VM to callback to schedule OV with SAE Follow-up by: Brenton Grills CMA Duncan Dull),  December 29, 2010 9:39 AM

## 2011-02-04 LAB — POCT I-STAT 4, (NA,K, GLUC, HGB,HCT)
Glucose, Bld: 308 mg/dL — ABNORMAL HIGH (ref 70–99)
HCT: 53 % — ABNORMAL HIGH (ref 39.0–52.0)
Hemoglobin: 18 g/dL — ABNORMAL HIGH (ref 13.0–17.0)
Sodium: 140 mEq/L (ref 135–145)

## 2011-02-04 LAB — GLUCOSE, CAPILLARY

## 2011-04-06 NOTE — Assessment & Plan Note (Signed)
[facility]                               PULMONARY OFFICE NOTE   NAME:Richard Mendoza, Richard Mendoza                       MRN:          [id number]  DATE:07/08/2006                            DOB:          07-07-1976    HISTORY OF PRESENT ILLNESS:  The patient is a 35 year old gentleman who I  have been asked to see for obstructive sleep apnea. The patient has  undergone nocturnal polysomnography at [facility]  where he was found by split-night study to have 104 obstructive events and  63 minutes of sleep. This gave him a respiratory disturbance index over that  time period of 112 events per hours.  The patient was then placed on CPAP by  protocol and ultimately titrated to a level of 14 with only minimal break  through.  A medium Quattro ResMed mask was used.  The patient states that he  typically goes to bed between 9 and 11 and gets up at 5:30 to start his day.  He has been told he has loud snoring and pauses in his breathing during  sleep. He is not rested upon arising.  The patient works as an Personnel officer  and states that he has even fallen asleep while sitting down and inserting a  receptacle into a wall. He states it is very difficult for him to get  through movies or TV programs without dozing.  The patient does get sleepy  with driving. Of note, his weight is up about 20 pounds over the last few  years.   PAST MEDICAL HISTORY:  1. Significant for hypertension.  2. History of diabetes.  3. Status post appendectomy.   CURRENT MEDICATIONS:  1. Benicar 20 mg daily.  2. Glucophage 500 mg daily.   ALLERGIES:  No known drug allergies.   SOCIAL HISTORY:  The patient is married and has children. He has a history  of smoking 1 to 1.5 packs per day for 11 years and continues to smoke.   FAMILY HISTORY:  Remarkable for his father having heart disease.   REVIEW OF SYSTEMS:  As per history of present illness. Also see form  documented  in the chart.   PHYSICAL EXAMINATION:  GENERAL:  He is an obese male in no acute distress.  VITAL SIGNS:  Blood pressure is 122/88, pulse 101, temperature 98.2. Weight  is 280 pounds, O2 saturation on room air is 95%.  HEENT:  Pupils are equal, round and reactive to light and accommodation.  Extraocular muscles are intact. Nares showed bilateral crusting with a  deviated septum to the left. Oropharynx shows significant soft tissue  redundancy as well as elongation of the soft palate and uvula.  CHEST:  Is totally clear.  CARDIAC EXAM:  Reveals regular rate and rhythm, no murmurs, rubs, or  gallops.  ABDOMEN:  Soft, nontender with good bowel sounds.  GENITALIA/BREAST EXAM:  Not done and not indicated.  EXTREMITIES:  Lower extremities are without edema, good pulses distally. No  calf tenderness.  NEUROLOGIC:  He is alert and oriented with no obvious motor deficits.  IMPRESSION:  Severe obstructive sleep apnea documented by nocturnal  polysomnography. Had a long discussion with him about the path of physiology  sleep apnea including the short term quality of life issues and the long  term cardiovascular issues. At this point in time weight loss, CPAP are his  only options for adequate treatment. He is agreeable to this.   PLAN:  1. Will initiate CPAP at 10 cm. Will ultimately have to get him to his      goal of 14 cm as his tolerance improves.  2. Work on weight loss.  3. I have reminded the patient of his moral responsibility to not drive or      work with hot wires while being sleepy.  4. The patient will follow up in 4 weeks or sooner if there are problems.                                   Barbaraann Share, MD,FCCP   [facility]  DD:  08/13/2006  DT:  08/14/2006  Job #:  [id number]   cc:   Karen Chafe, M.D.

## 2011-07-31 ENCOUNTER — Emergency Department (HOSPITAL_COMMUNITY)
Admission: EM | Admit: 2011-07-31 | Discharge: 2011-08-01 | Disposition: A | Discharge status: Home / Self Care | Payer: Self-pay | Attending: Emergency Medicine | Admitting: Emergency Medicine

## 2011-07-31 DIAGNOSIS — I10 Essential (primary) hypertension: Secondary | ICD-10-CM | POA: Insufficient documentation

## 2011-07-31 DIAGNOSIS — E119 Type 2 diabetes mellitus without complications: Secondary | ICD-10-CM | POA: Insufficient documentation

## 2011-07-31 DIAGNOSIS — Z794 Long term (current) use of insulin: Secondary | ICD-10-CM | POA: Insufficient documentation

## 2011-07-31 DIAGNOSIS — R109 Unspecified abdominal pain: Secondary | ICD-10-CM | POA: Insufficient documentation

## 2011-07-31 LAB — URINALYSIS, ROUTINE W REFLEX MICROSCOPIC
Bilirubin Urine: NEGATIVE
Hgb urine dipstick: NEGATIVE
Protein, ur: NEGATIVE mg/dL
Specific Gravity, Urine: 1.046 — ABNORMAL HIGH (ref 1.005–1.030)

## 2011-07-31 LAB — URINE MICROSCOPIC-ADD ON

## 2011-08-01 ENCOUNTER — Emergency Department (HOSPITAL_COMMUNITY): Payer: Self-pay

## 2011-08-01 LAB — GLUCOSE, CAPILLARY: Glucose-Capillary: 288 mg/dL — ABNORMAL HIGH (ref 70–99)

## 2011-08-01 LAB — CBC
MCHC: 36.3 g/dL — ABNORMAL HIGH (ref 30.0–36.0)
Platelets: 190 10*3/uL (ref 150–400)
RBC: 5.23 MIL/uL (ref 4.22–5.81)
RDW: 12.9 % (ref 11.5–15.5)
WBC: 10.9 10*3/uL — ABNORMAL HIGH (ref 4.0–10.5)

## 2011-08-01 LAB — COMPREHENSIVE METABOLIC PANEL
Albumin: 4 g/dL (ref 3.5–5.2)
BUN: 13 mg/dL (ref 6–23)
Calcium: 9.5 mg/dL (ref 8.4–10.5)
GFR calc Af Amer: >60 mL/min (ref >60)
GFR calc non Af Amer: >60 mL/min (ref >60)
Potassium: 3.4 mEq/L — ABNORMAL LOW (ref 3.5–5.1)
Total Bilirubin: 1 mg/dL (ref 0.3–1.2)
Total Protein: 7.4 g/dL (ref 6.0–8.3)

## 2011-08-01 LAB — DIFFERENTIAL
Eosinophils Absolute: 0.2 10*3/uL (ref 0.0–0.7)
Eosinophils Relative: 2 % (ref 0–5)

## 2011-08-01 LAB — LIPASE, BLOOD: Lipase: 80 U/L — ABNORMAL HIGH (ref 11–59)

## 2011-08-17 LAB — POCT I-STAT, CHEM 8
Chloride: 98
Creatinine, Ser: 0.9
Glucose, Bld: 325 — ABNORMAL HIGH
HCT: 51
Hemoglobin: 17.3 — ABNORMAL HIGH
Potassium: 3.4 — ABNORMAL LOW
Sodium: 136

## 2011-08-17 LAB — URINALYSIS, ROUTINE W REFLEX MICROSCOPIC
Bilirubin Urine: NEGATIVE
Glucose, UA: >1000 — AB
Leukocytes, UA: NEGATIVE
Nitrite: NEGATIVE
Specific Gravity, Urine: 1.043 — ABNORMAL HIGH
pH: 5

## 2011-08-17 LAB — GLUCOSE, CAPILLARY: Glucose-Capillary: 272 — ABNORMAL HIGH

## 2011-08-17 LAB — DIFFERENTIAL
Eosinophils Relative: 2
Lymphs Abs: 1.7
Neutro Abs: 5.7

## 2011-08-17 LAB — URINE MICROSCOPIC-ADD ON

## 2011-08-17 LAB — RAPID STREP SCREEN (MED CTR MEBANE ONLY): Streptococcus, Group A Screen (Direct): NEGATIVE

## 2011-08-17 LAB — CBC
MCHC: 34
MCV: 86.2
RBC: 5.69

## 2011-08-30 LAB — URINALYSIS, ROUTINE W REFLEX MICROSCOPIC
Hgb urine dipstick: NEGATIVE
Nitrite: NEGATIVE
Protein, ur: 30 — AB
Urobilinogen, UA: 2 — ABNORMAL HIGH

## 2011-08-30 LAB — URINE MICROSCOPIC-ADD ON

## 2011-08-30 LAB — GC/CHLAMYDIA PROBE AMP, GENITAL: GC Probe Amp, Genital: NEGATIVE

## 2011-09-03 LAB — DIFFERENTIAL
Eosinophils Relative: 3
Lymphocytes Relative: 31
Lymphs Abs: 3.5 — ABNORMAL HIGH
Monocytes Relative: 5

## 2011-09-03 LAB — CBC
HCT: 39.7
Hemoglobin: 13.4
Platelets: 253
RBC: 4.63
WBC: 11.4 — ABNORMAL HIGH

## 2011-09-03 LAB — POCT CARDIAC MARKERS
CKMB, poc: 1.2
CKMB, poc: 1.9
Myoglobin, poc: 78.4
Operator id: 270111
Troponin i, poc: <0.05

## 2011-09-03 LAB — I-STAT 8, (EC8 V) (CONVERTED LAB)
Acid-Base Excess: 2
BUN: 13
Chloride: 102
Glucose, Bld: 133 — ABNORMAL HIGH
Potassium: 3.2 — ABNORMAL LOW
pCO2, Ven: 39.8 — ABNORMAL LOW
pH, Ven: 7.427 — ABNORMAL HIGH

## 2011-09-03 LAB — D-DIMER, QUANTITATIVE: D-Dimer, Quant: 0.35

## 2011-09-27 ENCOUNTER — Other Ambulatory Visit: Payer: Self-pay | Admitting: *Deleted

## 2011-09-27 MED ORDER — INSULIN ASPART PROT & ASPART (70-30 MIX) 100 UNIT/ML ~~LOC~~ SUSP: SUBCUTANEOUS | Status: DC

## 2011-09-27 NOTE — Telephone Encounter (Signed)
R'cd fax from [facility] for refill of Novolog-pt called to schedule an appointment, pt states spouse will callback office to schedule appointment soon.  Last OV-06/2010 Last filled-10/30/2010

## 2011-10-24 ENCOUNTER — Ambulatory Visit (INDEPENDENT_AMBULATORY_CARE_PROVIDER_SITE_OTHER): Payer: BC Managed Care – PPO | Admitting: Family Medicine

## 2011-10-24 ENCOUNTER — Encounter: Payer: Self-pay | Admitting: Family Medicine

## 2011-10-24 DIAGNOSIS — E785 Hyperlipidemia, unspecified: Secondary | ICD-10-CM

## 2011-10-24 DIAGNOSIS — I10 Essential (primary) hypertension: Secondary | ICD-10-CM

## 2011-10-24 DIAGNOSIS — E119 Type 2 diabetes mellitus without complications: Secondary | ICD-10-CM

## 2011-10-24 DIAGNOSIS — F17200 Nicotine dependence, unspecified, uncomplicated: Secondary | ICD-10-CM

## 2011-10-24 MED ORDER — ONETOUCH ULTRASOFT LANCETS MISC: Status: AC

## 2011-10-24 MED ORDER — VARENICLINE TARTRATE 0.5 MG X 11 & 1 MG X 42 PO MISC: ORAL | Status: AC

## 2011-10-24 MED ORDER — VARENICLINE TARTRATE 1 MG PO TABS: 1.0000 mg | ORAL_TABLET | Freq: Two times a day (BID) | ORAL | Status: DC

## 2011-10-24 MED ORDER — GLUCOSE BLOOD VI STRP: ORAL_STRIP | Status: AC

## 2011-10-24 NOTE — Patient Instructions (Signed)
Check blood sugars at least twice daily and record Get back on insulin 70/30 125 units in AM and 100 units at night.

## 2011-10-24 NOTE — Progress Notes (Signed)
Subjective:    Patient ID: Richard Mendoza, male    DOB: 01/08/1976, 35 y.o.   MRN: [id number]  HPI  Patient has history of type 2 diabetes and other medical problems including history of hyperlipidemia and hypertension. He has been out of insurance and basically off all medications with the exception of occasional insulin use over the past year. He had frequent symptoms of thirst and urinary frequency. Not checking blood sugars regularly. Has had diabetes for about 6 years. Has also had some weight loss during the past year. No excessive cough. Ongoing nicotine use and desires to quit. Specifically would like to try Chantix.  Hyperlipidemia previously treated with Pravachol. Has been off all medications for several months with the exception of occasional insulin. Now has insurance. No recent lab work. Presented to ER back in September with dehydration. This was felt to be related to poorly controlled diabetes.  Past Medical History  Diagnosis Date  . 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 DM 12/08/2008  . DIABETES MELLITUS, TYPE II, UNCONTROLLED 03/17/2009  . HYPERLIPIDEMIA 03/17/2009  . OBSTRUCTIVE SLEEP APNEA 12/08/2008  . HYPERTENSION 12/08/2008   Past Surgical History  Procedure Date  . Appendectomy     reports that he has been smoking Cigarettes.  He has a 25.5 pack-year smoking history. He does not have any smokeless tobacco history on file. His alcohol and drug histories not on file. family history includes Arthritis in his other; Cancer in his other; Diabetes in his father and mother; Heart disease in his father; Hyperlipidemia in his other; and Hypertension in his other. No Known Allergies    Review of Systems  Constitutional: Positive for fatigue and unexpected weight change. Negative for fever and chills.  HENT: Negative for trouble swallowing.   Eyes: Negative for visual disturbance.  Respiratory: Negative for cough, shortness of breath and wheezing.   Cardiovascular: Negative for  chest pain, palpitations and leg swelling.  Gastrointestinal: Negative for abdominal pain.  Genitourinary: Positive for frequency. Negative for dysuria.  Skin: Negative for rash.  Neurological: Negative for dizziness.       Objective:   Physical Exam  Constitutional: He is oriented to person, place, and time. He appears well-developed and well-nourished. No distress.  HENT:  Mouth/Throat: Oropharynx is clear and moist.  Eyes: Pupils are equal, round, and reactive to light.  Neck: Neck supple. No thyromegaly present.  Cardiovascular: Normal rate, regular rhythm and normal heart sounds.   Pulmonary/Chest: Effort normal and breath sounds normal. No respiratory distress. He has no wheezes. He has no rales.  Musculoskeletal: He exhibits no edema.       Feet reveal no skin lesions. Good distal foot pulses. Good capillary refill. No calluses. Normal sensation with monofilament testing   Lymphadenopathy:    He has no cervical adenopathy.  Neurological: He is alert and oriented to person, place, and time.  Skin: No rash noted.  Psychiatric: He has a normal mood and affect. His behavior is normal.          Assessment & Plan:  #1 history of type 2 diabetes. Poorly controlled. New home glucose monitor given with test strips. Get back on NovoLog 70/30 mix 125 units a.m. and 100 units p.m. Check urine microalbumin. Check A1c. Improved dietary compliance. Reassess one month #2 dyslipidemia. Check lipid and hepatic panel. Will likely need to be back on statin medication  #3 borderline elevated blood pressure. Add ACE inhibitor if positive urine microalbumin or blood pressure still up at followup #4 nicotine  use. Reviewed options regarding cessation. Start Chantix starter pack and we'll plan 3 months of therapy. Spent approximately 5 minutes on smoking cessation

## 2011-10-25 LAB — BASIC METABOLIC PANEL
CO2: 24 mEq/L (ref 19–32)
Calcium: 9.4 mg/dL (ref 8.4–10.5)
Glucose, Bld: 340 mg/dL — ABNORMAL HIGH (ref 70–99)
Potassium: 3.9 mEq/L (ref 3.5–5.1)
Sodium: 135 mEq/L (ref 135–145)

## 2011-10-25 LAB — HEPATIC FUNCTION PANEL
AST: 19 U/L (ref 0–37)
Albumin: 4.2 g/dL (ref 3.5–5.2)
Alkaline Phosphatase: 103 U/L (ref 39–117)
Total Protein: 7.5 g/dL (ref 6.0–8.3)

## 2011-10-25 LAB — LIPID PANEL: Triglycerides: 827 mg/dL — ABNORMAL HIGH (ref 0.0–149.0)

## 2011-10-29 ENCOUNTER — Telehealth: Payer: Self-pay | Admitting: Family Medicine

## 2011-10-29 NOTE — Telephone Encounter (Signed)
Pt requesting lab results.

## 2011-10-30 ENCOUNTER — Other Ambulatory Visit: Payer: Self-pay | Admitting: Family Medicine

## 2011-10-30 DIAGNOSIS — E785 Hyperlipidemia, unspecified: Secondary | ICD-10-CM

## 2011-10-30 MED ORDER — ATORVASTATIN CALCIUM 20 MG PO TABS: 20.0000 mg | ORAL_TABLET | Freq: Every day | ORAL | Status: DC

## 2011-10-30 NOTE — Telephone Encounter (Signed)
Results given on VM earlier today, copy mailed to his home also

## 2011-10-30 NOTE — Progress Notes (Signed)
Quick Note:  Copy of labs mailed to home, future lab ordered, med sent to pharmacy ______

## 2011-11-08 ENCOUNTER — Telehealth: Payer: Self-pay | Admitting: Pulmonary Disease

## 2011-11-08 DIAGNOSIS — G4733 Obstructive sleep apnea (adult) (pediatric): Secondary | ICD-10-CM

## 2011-11-08 NOTE — Telephone Encounter (Signed)
The correct number is [phone].  Richard Mendoza

## 2011-11-08 NOTE — Telephone Encounter (Signed)
LMOM for pt's wife TCB

## 2011-11-08 NOTE — Telephone Encounter (Signed)
I ATC the the number given and it was d/c'ed- Jeanice Lim is the correct number? Please advise, thanks!

## 2011-11-09 NOTE — Telephone Encounter (Signed)
Spoke with pt's spouse and notified order sent to [facility] for pt to get new CPAP supplies. She verbalized understanding and is aware that nothing further will be done until the pt comes in for appt.

## 2011-11-09 NOTE — Telephone Encounter (Signed)
Wife calling back.  [phone]

## 2011-11-09 NOTE — Telephone Encounter (Signed)
Ok with me.  But let her know if she does not come to followup i cannot do anything else until I see her.

## 2011-11-09 NOTE — Telephone Encounter (Signed)
Pt last seen 12/08/2008 and scheduled to see [facility] 12/02/10 for follow up. Is requesting new supplies for CPAP machine, has been using original supplies. Pt has lost 18 lbs since last seen. Has used [facility] in the past, please advise. Call back # [phone].

## 2011-11-23 ENCOUNTER — Ambulatory Visit (INDEPENDENT_AMBULATORY_CARE_PROVIDER_SITE_OTHER): Payer: BC Managed Care – PPO | Admitting: Family Medicine

## 2011-11-23 ENCOUNTER — Encounter: Payer: Self-pay | Admitting: Family Medicine

## 2011-11-23 VITALS — BP 130/84 | Temp 98.1°F | Wt 226.0 lb

## 2011-11-23 DIAGNOSIS — E785 Hyperlipidemia, unspecified: Secondary | ICD-10-CM

## 2011-11-23 DIAGNOSIS — E119 Type 2 diabetes mellitus without complications: Secondary | ICD-10-CM

## 2011-11-23 NOTE — Progress Notes (Signed)
  Subjective:    Patient ID: Richard Mendoza, male    DOB: 03/20/1976, 36 y.o.   MRN: [id number]  HPI  Medical followup. Refer to prior note. Had run out of all medications and recent started back on Lipitor and insulin 70/30 125 units in the morning 100 use at night.  No symptoms of hyperglycemia.  No hypoglycemia.  Blood sugars have improved but he forgot his home readings today. Generally fasting still run 180. We will fax over readings later. Is back on Lipitor 20 mg daily and tolerating well. In process of quitting smoking. Overall feels improved. Also has obstructive sleep apnea and has followup with pulmonologist soon.  Still has poor dietary compliance at times and trying to make some change.  Still occasionally drinks things like regular sodas.   Review of Systems  Constitutional: Negative for appetite change and unexpected weight change.  Respiratory: Negative for cough and shortness of breath.   Cardiovascular: Negative for chest pain.  Genitourinary: Negative for dysuria.  Neurological: Negative for dizziness.       Objective:   Physical Exam  Constitutional: He appears well-developed and well-nourished.  HENT:  Mouth/Throat: Oropharynx is clear and moist.  Neck: Neck supple. No thyromegaly present.  Cardiovascular: Normal rate and regular rhythm.   Pulmonary/Chest: Effort normal and breath sounds normal. No respiratory distress. He has no wheezes. He has no rales.  Musculoskeletal: He exhibits no edema.  Lymphadenopathy:    He has no cervical adenopathy.          Assessment & Plan:  #1 type 2 diabetes. History of poor control. Fax home blood sugar readings so we can titrate insulin more affectively. Future labs ordered for A1c in 2 months #2 hyperlipidemia. Continue Lipitor and reassess lipids and hepatic panel in 2 months

## 2011-12-03 ENCOUNTER — Encounter: Payer: Self-pay | Admitting: Pulmonary Disease

## 2011-12-03 ENCOUNTER — Ambulatory Visit (INDEPENDENT_AMBULATORY_CARE_PROVIDER_SITE_OTHER): Payer: BC Managed Care – PPO | Admitting: Pulmonary Disease

## 2011-12-03 VITALS — BP 136/80 | HR 87 | Temp 98.3°F | Ht 70.0 in | Wt 226.8 lb

## 2011-12-03 DIAGNOSIS — G4733 Obstructive sleep apnea (adult) (pediatric): Secondary | ICD-10-CM

## 2011-12-03 NOTE — Progress Notes (Signed)
  Subjective:    Patient ID: Richard Mendoza, male    DOB: March 14, 1976, 36 y.o.   MRN: [id number]  HPI Patient comes in today for followup of his obstructive sleep apnea.  He is wearing CPAP compliantly, and is having no issues with his mask fit.  He is wondering if his pressure needs to be decreased since he has lost 34 pounds since last visit.  He feels that he sleeps well with the CPAP, and denies any issues with daytime sleepiness.   Review of Systems  Constitutional: Negative for fever and unexpected weight change.  HENT: Negative for ear pain, nosebleeds, congestion, sore throat, rhinorrhea, sneezing, trouble swallowing, dental problem, postnasal drip and sinus pressure.   Eyes: Negative for redness and itching.  Respiratory: Negative for cough, chest tightness, shortness of breath and wheezing.   Cardiovascular: Negative for palpitations and leg swelling.  Gastrointestinal: Negative for nausea and vomiting.  Genitourinary: Negative for dysuria.  Musculoskeletal: Negative for joint swelling.  Skin: Negative for rash.  Neurological: Negative for headaches.  Hematological: Does not bruise/bleed easily.  Psychiatric/Behavioral: Negative for dysphoric mood. The patient is not nervous/anxious.        Objective:   Physical Exam Overweight male in no acute distress Nose without purulence or discharge noted No skin breakdown or pressure necrosis from the CPAP mask Lower extremities without edema, no cyanosis noted Alert and oriented, moves all 4 extremities.       Assessment & Plan:

## 2011-12-03 NOTE — Assessment & Plan Note (Signed)
The patient is doing very well with CPAP, and feels that he is sleeping well with excellent daytime alertness.  He has lost 34 pounds since last visit, and therefore we need to optimize his pressure again.  He is also due for a new CPAP machine, and we'll set this up with his DME.  I've encouraged him to work aggressively on continued weight loss, and to follow up with me in one year.

## 2011-12-03 NOTE — Patient Instructions (Signed)
Continue to work on weight loss Will get you a new machine, and optimize your pressure since you have lost weight. followup with me in one year.

## 2011-12-04 ENCOUNTER — Telehealth: Payer: Self-pay | Admitting: Pulmonary Disease

## 2011-12-04 NOTE — Telephone Encounter (Signed)
Order was sent to [facility] but it takes a few days for it to be processed into their system. Pt wife advised. I advised if they had not heard anything by Friday to call back. Carron Curie, CMA

## 2011-12-20 ENCOUNTER — Encounter: Payer: Self-pay | Admitting: Family Medicine

## 2011-12-20 ENCOUNTER — Ambulatory Visit (INDEPENDENT_AMBULATORY_CARE_PROVIDER_SITE_OTHER): Payer: BC Managed Care – PPO | Admitting: Family Medicine

## 2011-12-20 VITALS — BP 140/90 | Temp 98.0°F | Wt 229.0 lb

## 2011-12-20 DIAGNOSIS — H109 Unspecified conjunctivitis: Secondary | ICD-10-CM

## 2011-12-20 MED ORDER — POLYMYXIN B-TRIMETHOPRIM 10000-0.1 UNIT/ML-% OP SOLN: 2.0000 [drp] | OPHTHALMIC | Status: AC

## 2011-12-20 NOTE — Progress Notes (Signed)
  Subjective:    Patient ID: Richard Mendoza, male    DOB: 1976/10/14, 36 y.o.   MRN: [id number]  HPI  Acute visit. Right eye irritation. First noticed Monday morning. No contact use. No injury. He denies any blurred vision. Mild eye irritation. Minimal itching. Progressive symptoms during the week. He noticed some mild puffiness lower lid earlier today. No foreign body. Has not tried any warm compresses. No alleviating factors.  Type 2 diabetes poorly controlled. Recently went back on insulin and blood sugars are slowly improving.   Review of Systems  Constitutional: Negative for fever and chills.  HENT: Negative for congestion.   Eyes: Positive for redness. Negative for photophobia, discharge and visual disturbance.  Skin: Negative for rash.  Neurological: Negative for headaches.       Objective:   Physical Exam  Constitutional: He appears well-developed and well-nourished.  Eyes: Pupils are equal, round, and reactive to light.       Right conjunctiva slightly erythematous. Pupils equal and reactive to light. Cornea appears normal bilaterally. Fluorescein staining right eye with no evidence for any ulceration or foreign body. He has minimal crusted drainage right lower lid. No rash  Visual acuity is normal with 20 over 20 right and left eye without correction  Cardiovascular: Normal rate and regular rhythm.   Pulmonary/Chest: Effort normal and breath sounds normal. No respiratory distress. He has no wheezes. He has no rales.          Assessment & Plan:  Probable bacterial conjunctivitis right eye. Continue warm compresses. Polytrim ophthalmic drops 2 drops right eye every 4 hours while awake. Touch base in 3-4 days if not resolving

## 2011-12-27 ENCOUNTER — Ambulatory Visit: Payer: BC Managed Care – PPO | Admitting: Family Medicine

## 2012-01-13 ENCOUNTER — Other Ambulatory Visit: Payer: Self-pay | Admitting: Pulmonary Disease

## 2012-01-13 DIAGNOSIS — G4733 Obstructive sleep apnea (adult) (pediatric): Secondary | ICD-10-CM

## 2012-01-17 ENCOUNTER — Other Ambulatory Visit: Payer: BC Managed Care – PPO

## 2012-01-17 ENCOUNTER — Telehealth: Payer: Self-pay | Admitting: Family Medicine

## 2012-01-17 MED ORDER — INSULIN ASPART PROT & ASPART (70-30 MIX) 100 UNIT/ML ~~LOC~~ SUSP: SUBCUTANEOUS | Status: DC

## 2012-01-17 NOTE — Telephone Encounter (Signed)
Rx sent to pharmacy   

## 2012-01-17 NOTE — Telephone Encounter (Signed)
Patient spouse states that the patient needs an rx for novolog 70/30 mix sent to [facility] on [address]. Please assist.

## 2012-01-18 ENCOUNTER — Other Ambulatory Visit: Payer: Self-pay | Admitting: *Deleted

## 2012-01-18 ENCOUNTER — Telehealth: Payer: Self-pay | Admitting: Family Medicine

## 2012-01-18 MED ORDER — INSULIN ASPART 100 UNIT/ML ~~LOC~~ SOLN: SUBCUTANEOUS | Status: DC

## 2012-01-18 MED ORDER — INSULIN ASPART PROT & ASPART (70-30 MIX) 100 UNIT/ML ~~LOC~~ SUSP: SUBCUTANEOUS | Status: DC

## 2012-01-18 NOTE — Telephone Encounter (Signed)
Pts wife called and said that the wrong insulin was called in for pt. Should be the Flex Pen 70/30. [facility] [address]

## 2012-01-24 ENCOUNTER — Ambulatory Visit: Payer: BC Managed Care – PPO | Admitting: Family Medicine

## 2012-01-25 ENCOUNTER — Other Ambulatory Visit: Payer: Self-pay | Admitting: *Deleted

## 2012-01-25 MED ORDER — INSULIN ASPART 100 UNIT/ML ~~LOC~~ SOLN: SUBCUTANEOUS | Status: DC

## 2012-02-08 ENCOUNTER — Other Ambulatory Visit: Payer: Self-pay | Admitting: *Deleted

## 2012-06-25 ENCOUNTER — Encounter: Payer: Self-pay | Admitting: Family Medicine

## 2012-06-25 ENCOUNTER — Ambulatory Visit (INDEPENDENT_AMBULATORY_CARE_PROVIDER_SITE_OTHER): Payer: BC Managed Care – PPO | Admitting: Family Medicine

## 2012-06-25 VITALS — BP 118/88 | Temp 98.1°F | Wt 216.0 lb

## 2012-06-25 DIAGNOSIS — M546 Pain in thoracic spine: Secondary | ICD-10-CM

## 2012-06-25 DIAGNOSIS — M549 Dorsalgia, unspecified: Secondary | ICD-10-CM

## 2012-06-25 DIAGNOSIS — R197 Diarrhea, unspecified: Secondary | ICD-10-CM

## 2012-06-25 DIAGNOSIS — IMO0001 Reserved for inherently not codable concepts without codable children: Secondary | ICD-10-CM

## 2012-06-25 DIAGNOSIS — E785 Hyperlipidemia, unspecified: Secondary | ICD-10-CM

## 2012-06-25 DIAGNOSIS — E119 Type 2 diabetes mellitus without complications: Secondary | ICD-10-CM

## 2012-06-25 LAB — GLUCOSE, POCT (MANUAL RESULT ENTRY): POC Glucose: 236 mg/dl — AB (ref 70–99)

## 2012-06-25 MED ORDER — CYCLOBENZAPRINE HCL 10 MG PO TABS: 10.0000 mg | ORAL_TABLET | Freq: Three times a day (TID) | ORAL | Status: AC | PRN

## 2012-06-25 NOTE — Patient Instructions (Signed)
Start back Lipitor one daily

## 2012-06-25 NOTE — Progress Notes (Signed)
Subjective:    Patient ID: Richard Mendoza, male    DOB: 07-02-1976, 35 y.o.   MRN: [id number]  HPI  Medical followup. Patient states diabetes has been uncontrolled. Poor compliance with diet and insulin at times. He takes NovoLog 70 /30 150 units in the morning and 100 units at night. No hypoglycemia. Rarely checking blood sugars. He has some ongoing weight loss but no increased thirst. Last A1c was over 11%. Poor compliance with followup.  Dyslipidemia. Restarted Lipitor and he is convinced diarrhea was related and stopped Lipitor but still had some diarrhea off and on. He denies any myalgias or arthralgias.  Has had diarrhea off and on for couple years. Sometimes has up to 10 stools per day. These are watery nonbloody. He had some gradual weight loss. Apparently his wife had scheduled him to see gastroenterologist with [facility] but now they're requesting referral to [place]. No recent antibiotics. No recent travels.  Patient has left periscapular pain. Sore to move and touch. Aleve without relief. Present for one month. No injury. No visible rash or swelling. Feels stiff frequently. No neck pain. No radiculopathy symptoms.  Past Medical History  Diagnosis Date  . 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 DM 12/08/2008  . DIABETES MELLITUS, TYPE II, UNCONTROLLED 03/17/2009  . HYPERLIPIDEMIA 03/17/2009  . OBSTRUCTIVE SLEEP APNEA 12/08/2008  . HYPERTENSION 12/08/2008   Past Surgical History  Procedure Date  . Appendectomy     reports that he has been smoking Cigarettes.  He has a 25.5 pack-year smoking history. He does not have any smokeless tobacco history on file. His alcohol and drug histories not on file. family history includes Arthritis in his other; Cancer in his other; Diabetes in his father and mother; Heart disease in his father; Hyperlipidemia in his other; and Hypertension in his other. No Known Allergies   Review of Systems  Constitutional: Negative for fatigue.  Eyes: Negative for visual  disturbance.  Respiratory: Negative for cough, chest tightness and shortness of breath.   Cardiovascular: Negative for chest pain, palpitations and leg swelling.  Neurological: Negative for dizziness, syncope, weakness, light-headedness and headaches.       Objective:   Physical Exam  Constitutional: He is oriented to person, place, and time. He appears well-developed and well-nourished.  HENT:  Mouth/Throat: Oropharynx is clear and moist.  Neck: Neck supple. No thyromegaly present.  Cardiovascular: Normal rate and regular rhythm.   Pulmonary/Chest: Effort normal and breath sounds normal. No respiratory distress. He has no wheezes. He has no rales.  Musculoskeletal: He exhibits no edema.       Feet reveal no skin lesions. Good distal foot pulses. Good capillary refill. No calluses. Normal sensation with monofilament testing  Patient has some muscular tension and soreness left trapezius and medial to the scapular region. No spinal tenderness. Full range of motion left shoulder   Lymphadenopathy:    He has no cervical adenopathy.  Neurological: He is alert and oriented to person, place, and time.          Assessment & Plan:  #1 type 2 diabetes. History of poor control. History of poor compliance. Recheck A1c. We have recommended consideration for getting back to endocrinologist if remains poorly controlled. Question whether candidate for insulin pump though he does have history of poor compliance #2 dyslipidemia. Get back on Lipitor 20 mg daily #3 left upper back pain. Suspect muscular. Short-term trial of Flexeril 10 mg each bedtime along with moist heat and muscle massage. Consider trigger point injections if  no better in one to 2 weeks  #4 history of chronic intermittent diarrhea. Never evaluated previously. Not related to specific foods. Question related to his diabetes, ?pancreatic insufficiency. Patient wife requesting GI referral which seems reasonable at this time.  Pt does not  take metformin.

## 2012-07-01 NOTE — Progress Notes (Signed)
Quick Note:  Left a second message that labs will be mailed to his home, instructions highlighted ______

## 2012-07-01 NOTE — Progress Notes (Signed)
Quick Note:  Home phone "no longer in service". I asked pt on a note to please update his phone numbers. ______

## 2012-07-08 ENCOUNTER — Encounter: Payer: Self-pay | Admitting: Internal Medicine

## 2012-08-04 ENCOUNTER — Encounter: Payer: Self-pay | Admitting: Internal Medicine

## 2012-08-06 ENCOUNTER — Ambulatory Visit: Payer: BC Managed Care – PPO | Admitting: Internal Medicine

## 2012-08-28 ENCOUNTER — Telehealth: Payer: Self-pay | Admitting: Family Medicine

## 2012-08-28 MED ORDER — SILDENAFIL CITRATE 100 MG PO TABS: 100.0000 mg | ORAL_TABLET | Freq: Every day | ORAL | Status: DC | PRN

## 2012-08-28 NOTE — Telephone Encounter (Signed)
Viagra 100 mg one daily as needed dispense #6 with 3 refills

## 2012-08-28 NOTE — Telephone Encounter (Signed)
Pt is dm having trouble maintaining an erection. Pt would like to try viagra [facility] [address]

## 2012-08-28 NOTE — Telephone Encounter (Signed)
Last OV was 06/25/12 for cough

## 2012-08-29 ENCOUNTER — Telehealth: Payer: Self-pay | Admitting: Family Medicine

## 2012-08-29 NOTE — Telephone Encounter (Signed)
Caller: Jessica/Patient; Patient Name: Richard Mendoza; PCP: Evelena Peat [facility]); Best Callback Phone Number: [phone];  Wife/Jessica is calling about wife called office 08/28/12 for erectile dysfunction, Viagra was called in but it is not covered under insurance as it was listed as cosmetic.  Wife would like prescription changed so that insurance can cover.   [facility] [address] if able to change diagnosis to have insurance cover the Viagra.

## 2012-08-29 NOTE — Telephone Encounter (Signed)
I spoke with pt wife and informed her Pharmacist at [facility] [address] reported they do not put a diagnosis on a med, he found nothing on the med and insurance communication that said "cosmetic"  Pharmacist did say all the ED meds are expensive.

## 2012-12-02 ENCOUNTER — Ambulatory Visit: Payer: BC Managed Care – PPO | Admitting: Pulmonary Disease

## 2013-02-02 ENCOUNTER — Other Ambulatory Visit: Payer: Self-pay | Admitting: Family Medicine

## 2013-02-02 NOTE — Telephone Encounter (Signed)
Pt would also like to know (along w/ this request for his insulin) if he could have a refill on CHANTIX . Pt got this over a year ago. Pharm: [facility] [address], [place], [place]

## 2013-02-03 ENCOUNTER — Other Ambulatory Visit: Payer: Self-pay | Admitting: *Deleted

## 2013-02-03 MED ORDER — INSULIN ASPART PROT & ASPART (70-30 MIX) 100 UNIT/ML ~~LOC~~ SUSP: SUBCUTANEOUS | Status: DC

## 2013-02-03 MED ORDER — VARENICLINE TARTRATE 1 MG PO TABS: 1.0000 mg | ORAL_TABLET | Freq: Two times a day (BID) | ORAL | Status: AC

## 2013-03-31 ENCOUNTER — Ambulatory Visit (INDEPENDENT_AMBULATORY_CARE_PROVIDER_SITE_OTHER): Payer: BC Managed Care – PPO | Admitting: Family Medicine

## 2013-03-31 ENCOUNTER — Encounter: Payer: Self-pay | Admitting: Family Medicine

## 2013-03-31 ENCOUNTER — Telehealth: Payer: Self-pay | Admitting: Family Medicine

## 2013-03-31 VITALS — BP 126/84 | HR 99 | Temp 98.7°F | Wt 206.0 lb

## 2013-03-31 DIAGNOSIS — J019 Acute sinusitis, unspecified: Secondary | ICD-10-CM

## 2013-03-31 MED ORDER — AMOXICILLIN-POT CLAVULANATE 875-125 MG PO TABS: 1.0000 | ORAL_TABLET | Freq: Two times a day (BID) | ORAL | Status: DC

## 2013-03-31 NOTE — Progress Notes (Signed)
  Subjective:    Patient ID: Richard Mendoza, male    DOB: 22-Nov-1975, 37 y.o.   MRN: [id number]  HPI Here for 4 days of sinus pressure, pain in the left cheek area, PND, and ST. No cough or fever. On Zyrtec.    Review of Systems  Constitutional: Negative.   HENT: Positive for congestion, postnasal drip and sinus pressure.   Eyes: Negative.   Respiratory: Negative.        Objective:   Physical Exam  Constitutional: He appears well-developed and well-nourished.  HENT:  Right Ear: External ear normal.  Left Ear: External ear normal.  Nose: Nose normal.  Mouth/Throat: Oropharynx is clear and moist.  Eyes: Conjunctivae are normal.  Neck: No thyromegaly present.  Pulmonary/Chest: Effort normal and breath sounds normal.  Lymphadenopathy:    He has no cervical adenopathy.          Assessment & Plan:  Add Mucinex prn

## 2013-03-31 NOTE — Telephone Encounter (Signed)
Patient Information:  Caller Name: [name]  Phone: [phone]  Patient: Richard Mendoza, Richard Mendoza  Gender: Male  DOB: [birth year]  Age: 37 Years  PCP: Evelena Peat (Family Practice)  Office Follow Up:  Does the office need to follow up with this patient?: No  Instructions For The Office: N/A   Symptoms  Reason For Call & Symptoms: had sinus sxs for a wk; pain started 5/12 and thought it was a toothache; c/o HA; swollen under the left eye; runny nose;  has taken Amoxicillin 500mg  x 3 doses and a pain pill; left work today d/t the pain  Reviewed Health History In EMR: Yes  Reviewed Medications In EMR: Yes  Reviewed Allergies In EMR: Yes  Reviewed Surgeries / Procedures: Yes  Date of Onset of Symptoms: 03/30/2013  Treatments Tried: Orajel, Claritin  Treatments Tried Worked: No  Guideline(s) Used:  Colds  Disposition Per Guideline:   See Today or Tomorrow in Office  Reason For Disposition Reached:   Sinus congestion (pressure, fullness) present > 10 days  Advice Given:  N/A  Patient Will Follow Care Advice:  YES  Appointment Scheduled:  03/31/2013 16:15:00 Appointment Scheduled Provider:  Gershon Crane (Family Practice)

## 2013-09-10 ENCOUNTER — Telehealth: Payer: Self-pay | Admitting: Family Medicine

## 2013-09-10 NOTE — Telephone Encounter (Signed)
Pt wife would like a referral for her husband to go [facility]. Pt would like to start on insulin pump

## 2013-09-10 NOTE — Telephone Encounter (Signed)
Left message for patient to return call.

## 2013-09-11 NOTE — Telephone Encounter (Signed)
Pt wife called back and stated that they had been once. I told patient if they have been to the office once that they should not need another referral.

## 2013-10-20 ENCOUNTER — Telehealth: Payer: Self-pay | Admitting: Family Medicine

## 2013-10-20 NOTE — Telephone Encounter (Signed)
Opened in error

## 2013-10-29 ENCOUNTER — Ambulatory Visit: Payer: BC Managed Care – PPO | Admitting: Endocrinology

## 2013-11-23 ENCOUNTER — Ambulatory Visit (INDEPENDENT_AMBULATORY_CARE_PROVIDER_SITE_OTHER): Payer: BC Managed Care – PPO | Admitting: Endocrinology

## 2013-11-23 ENCOUNTER — Encounter: Payer: Self-pay | Admitting: Endocrinology

## 2013-11-23 VITALS — BP 124/86 | HR 87 | Temp 97.6°F | Ht 70.0 in | Wt 202.0 lb

## 2013-11-23 DIAGNOSIS — E119 Type 2 diabetes mellitus without complications: Secondary | ICD-10-CM

## 2013-11-23 MED ORDER — GLUCOSE BLOOD VI STRP: 1.0000 | ORAL_STRIP | Freq: Two times a day (BID) | Status: DC

## 2013-11-23 MED ORDER — INSULIN GLARGINE 100 UNIT/ML SOLOSTAR PEN: 50.0000 [IU] | PEN_INJECTOR | Freq: Every day | SUBCUTANEOUS | Status: DC

## 2013-11-23 MED ORDER — ONETOUCH VERIO SYNC SYSTEM W/DEVICE KIT: 1.0000 | PACK | Freq: Once | Status: DC

## 2013-11-23 NOTE — Progress Notes (Signed)
Subjective:    Patient ID: Richard Mendoza, male    DOB: 1975/12/08, 38 y.o.   MRN: [id number]  HPI pt reports insulin-requiring DM (dx'ed 2009; he has mild if any neuropathy of the lower extremities.; he is unaware of any associated chronic complications.  he has been on insulin since 2010; he has been prescribed novolog 70/30, 100 units two times a day.  He says he seldom takes the insulin.  He has frequent tremor if he takes the full rx'ed dosage of insulin.  However, during these sxs, cbg's are in the 200's.  Pt and wife both say noncompliance is severe, and that the best way to address this is qd insulin, taken at hs.   Past Medical History  Diagnosis Date  . 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 DM 12/08/2008  . DIABETES MELLITUS, TYPE II, UNCONTROLLED 03/17/2009  . HYPERLIPIDEMIA 03/17/2009  . OBSTRUCTIVE SLEEP APNEA 12/08/2008  . HYPERTENSION 12/08/2008    Past Surgical History  Procedure Laterality Date  . Appendectomy      History   Social History  . Marital Status: Married    Spouse Name: N/A    Number of Children: N/A  . Years of Education: N/A   Occupational History  . Not on file.   Social History Main Topics  . Smoking status: Current Every Day Smoker -- 1.50 packs/day for 17 years    Types: Cigarettes  . Smokeless tobacco: Never Used     Comment: currently smoking 1 ppd.    . Alcohol Use: No  . Drug Use: No  . Sexual Activity: Not on file   Other Topics Concern  . Not on file   Social History Narrative  . No narrative on file    Current Outpatient Prescriptions on File Prior to Visit  Medication Sig Dispense Refill  . aspirin 325 MG tablet Take 325 mg by mouth daily.        Marland Kitchen. atorvastatin (LIPITOR) 20 MG tablet Take 1 tablet (20 mg total) by mouth daily.  30 tablet  6  . Insulin Pen Needle (EASY TOUCH PEN NEEDLES) 31G X 8 MM MISC by Does not apply route.        . sildenafil (VIAGRA) 100 MG tablet Take 1 tablet (100 mg total) by mouth daily as needed for  erectile dysfunction.  6 tablet  3   No current facility-administered medications on file prior to visit.   No Known Allergies  Family History  Problem Relation Age of Onset  . Diabetes Mother   . Heart disease Father   . Diabetes Father   . Arthritis Other   . Hyperlipidemia Other   . Hypertension Other   . Cancer Other     breast   BP 124/86  Pulse 87  Temp(Src) 97.6 F (36.4 C) (Oral)  Ht 5\' 10"  (1.778 m)  Wt 202 lb (91.627 kg)  BMI 28.98 kg/m2  SpO2 97%  Review of Systems He has lost 100 lbs, x 2 years.  He has dry mouth, excessive diaphoresis,easy bruising, headache, mood swings, intermittent diarrhea, and urinary frequency.  He denies hypoglycemia.  denies blurry vision, chest pain, sob, n/v, cramps, memory loss, depression, and rhinorrhea.      Objective:   Physical Exam VS: see vs page GEN: no distress HEAD: head: no deformity eyes: no periorbital swelling, no proptosis external nose and ears are normal mouth: no lesion seen NECK: supple, thyroid is not enlarged CHEST WALL: no deformity LUNGS: clear to auscultation BREASTS:  No gynecomastia CV: reg rate and rhythm, no murmur ABD: abdomen is soft, nontender.  no hepatosplenomegaly.  not distended.  no hernia MUSCULOSKELETAL: muscle bulk and strength are grossly normal.  no obvious joint swelling.  gait is normal and steady PULSES: no carotid bruit NEURO:  cn 2-12 grossly intact.   readily moves all 4's.   SKIN:  Normal texture and temperature.  No rash or suspicious lesion is visible.   NODES:  None palpable at the neck PSYCH: alert, well-oriented.  Does not appear anxious nor depressed.   Lab Results  Component Value Date   HGBA1C 12.4* 11/23/2013      Assessment & Plan:  DM: very poor control Noncompliance: this causes severe risks to his health Weight loss: due to severe hyperglycemia.

## 2013-11-23 NOTE — Patient Instructions (Addendum)
good diet and exercise habits significanly improve the control of your diabetes.  please let me know if you wish to be referred to a dietician.  high blood sugar is very risky to your health.  you should see an eye doctor every year.  You are at higher than average risk for pneumonia and hepatitis-B.  You should be vaccinated against both.   controlling your blood pressure and cholesterol drastically reduces the damage diabetes does to your body.  this also applies to quitting smoking.  please discuss these with your doctor.  check your blood sugar 1-2 times a day.  vary the time of day when you check, between before the 3 meals, and at bedtime.  also check if you have symptoms of your blood sugar being too high or too low.  please keep a record of the readings and bring it to your next appointment here.  You can write it on any piece of paper.  please call us sooner if your blood sugar goes below 70, or if you have a lot of readings over 200. The symptoms you describe will resolve as your blood sugar stays in a better range.   we will need to take this complex situation in stages. For now, please change to 50 units of lantus at bedtime.

## 2013-11-24 LAB — HEMOGLOBIN A1C: HEMOGLOBIN A1C: 12.4 % — AB (ref 4.6–6.5)

## 2013-11-24 LAB — MICROALBUMIN / CREATININE URINE RATIO
CREATININE, U: 58.6 mg/dL
MICROALB/CREAT RATIO: 0.7 mg/g (ref 0.0–30.0)
Microalb, Ur: 0.4 mg/dL (ref 0.0–1.9)

## 2013-11-27 ENCOUNTER — Encounter: Payer: Self-pay | Admitting: Endocrinology

## 2013-12-08 ENCOUNTER — Encounter: Payer: Self-pay | Admitting: Endocrinology

## 2014-01-14 ENCOUNTER — Encounter: Payer: BC Managed Care – PPO | Admitting: Endocrinology

## 2014-01-20 ENCOUNTER — Encounter: Payer: Self-pay | Admitting: Endocrinology

## 2014-01-20 ENCOUNTER — Ambulatory Visit (INDEPENDENT_AMBULATORY_CARE_PROVIDER_SITE_OTHER): Payer: BC Managed Care – PPO | Admitting: Endocrinology

## 2014-01-20 VITALS — BP 124/86 | HR 92 | Temp 98.0°F | Ht 70.0 in | Wt 201.0 lb

## 2014-01-20 DIAGNOSIS — E1040 Type 1 diabetes mellitus with diabetic neuropathy, unspecified: Secondary | ICD-10-CM | POA: Insufficient documentation

## 2014-01-20 DIAGNOSIS — E1029 Type 1 diabetes mellitus with other diabetic kidney complication: Secondary | ICD-10-CM

## 2014-01-20 DIAGNOSIS — Z0000 Encounter for general adult medical examination without abnormal findings: Secondary | ICD-10-CM

## 2014-01-20 DIAGNOSIS — E785 Hyperlipidemia, unspecified: Secondary | ICD-10-CM

## 2014-01-20 DIAGNOSIS — I10 Essential (primary) hypertension: Secondary | ICD-10-CM

## 2014-01-20 DIAGNOSIS — E119 Type 2 diabetes mellitus without complications: Secondary | ICD-10-CM

## 2014-01-20 MED ORDER — SILDENAFIL CITRATE 20 MG PO TABS: ORAL_TABLET | ORAL | Status: DC

## 2014-01-20 MED ORDER — INSULIN GLARGINE 100 UNIT/ML SOLOSTAR PEN: 90.0000 [IU] | PEN_INJECTOR | Freq: Every day | SUBCUTANEOUS | Status: DC

## 2014-01-20 NOTE — Progress Notes (Signed)
Subjective:    Patient ID: Richard Mendoza, male    DOB: 09-24-1976, 38 y.o.   MRN: [id number]  HPI pt returns for f/u of insulin-requiring DM (dx'ed 2009; he has mild if any neuropathy of the lower extremities; he is unaware of any associated chronic complications.  he has been on insulin since 2010; he is on a simple insulin schedule, due to chronic severe noncompliance).  He increased the lantus to 80 units qhs.  He has not recently checked cbg.  However, he says when he was taking it, cbg was consistently over 250.  He misses the insulin approx 2-3 times per week.  Past Medical History  Diagnosis Date  . 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 DM 12/08/2008  . DIABETES MELLITUS, TYPE II, UNCONTROLLED 03/17/2009  . HYPERLIPIDEMIA 03/17/2009  . OBSTRUCTIVE SLEEP APNEA 12/08/2008  . HYPERTENSION 12/08/2008    Past Surgical History  Procedure Laterality Date  . Appendectomy      History   Social History  . Marital Status: Married    Spouse Name: N/A    Number of Children: N/A  . Years of Education: N/A   Occupational History  . Not on file.   Social History Main Topics  . Smoking status: Current Every Day Smoker -- 1.50 packs/day for 17 years    Types: Cigarettes  . Smokeless tobacco: Never Used     Comment: currently smoking 1 ppd.    . Alcohol Use: No  . Drug Use: No  . Sexual Activity: Not on file   Other Topics Concern  . Not on file   Social History Narrative  . No narrative on file    Current Outpatient Prescriptions on File Prior to Visit  Medication Sig Dispense Refill  . aspirin 325 MG tablet Take 325 mg by mouth daily.        . Blood Glucose Monitoring Suppl (ONETOUCH VERIO [facility] SYSTEM) W/DEVICE KIT 1 Device by Does not apply route once.  1 kit  0  . glucose blood (ONETOUCH VERIO) test strip 1 each by Other route 2 (two) times daily. And lancets 2/day 250.03  100 each  12  . Insulin Pen Needle (EASY TOUCH PEN NEEDLES) 31G X 8 MM MISC by Does not apply route.        Marland Kitchen  atorvastatin (LIPITOR) 20 MG tablet Take 1 tablet (20 mg total) by mouth daily.  30 tablet  6   No current facility-administered medications on file prior to visit.    No Known Allergies  Family History  Problem Relation Age of Onset  . Diabetes Mother   . Heart disease Father   . Diabetes Father   . Arthritis Other   . Hyperlipidemia Other   . Hypertension Other   . Cancer Other     breast    BP 124/86  Pulse 92  Temp(Src) 98 F (36.7 C) (Oral)  Ht 5' 10"  (1.778 m)  Wt 201 lb (91.173 kg)  BMI 28.84 kg/m2  SpO2 96%  Review of Systems ED sxs persist.  He denies hypoglycemia.      Objective:   Physical Exam VITAL SIGNS:  See vs page GENERAL: no distress SKIN:  Insulin injection sites at the anterior abdomen are normal.    Lab Results  Component Value Date   HGBA1C 12.4* 11/23/2013      Assessment & Plan:  DM: very poor control.  He needs a simple insulin schedule, and achievable goals.   Noncompliance: severe.  We  discussed the risks of this.

## 2014-01-20 NOTE — Patient Instructions (Signed)
check your blood sugar 1-2 times a day.  vary the time of day when you check, between before the 3 meals, and at bedtime.  also check if you have symptoms of your blood sugar being too high or too low.  please keep a record of the readings and bring it to your next appointment here.  You can write it on any piece of paper.  please call us sooner if your blood sugar goes below 70, or if you have a lot of readings over 200. The symptoms you describe will resolve as your blood sugar stays in a better range.   we will need to take this complex situation in stages. For now, please increase lantus to 90 units of lantus at bedtime.   Please come back for a follow-up appointment in 1 month.   Please also see dr Caryl Neverburchette, for your annual checkup.

## 2014-02-19 ENCOUNTER — Ambulatory Visit: Payer: BC Managed Care – PPO | Admitting: Endocrinology

## 2014-03-12 ENCOUNTER — Telehealth: Payer: Self-pay | Admitting: Family Medicine

## 2014-03-12 NOTE — Telephone Encounter (Signed)
Pt informed

## 2014-03-12 NOTE — Telephone Encounter (Signed)
Pt wife call requesting samples of Insulin Glargine (LANTUS) 100 UNIT/ML Solostar Pen

## 2014-03-12 NOTE — Telephone Encounter (Signed)
He is seeing Dr Everardo AllEllison for his diabetes and I would have them inquire of Dr Everardo AllEllison.  Levemir would be similar but would get approval from his endocrinologist.

## 2014-03-12 NOTE — Telephone Encounter (Signed)
Informed pt that we are out of lantus. Is there another insulin that you would like to give patient.

## 2014-05-18 ENCOUNTER — Telehealth: Payer: Self-pay | Admitting: Family Medicine

## 2014-05-18 NOTE — Telephone Encounter (Signed)
Pt wife called to request a referral for pt to see a Gastroenterology and pt is requesting [facility]

## 2014-05-19 NOTE — Telephone Encounter (Signed)
Left message for patient to return call.

## 2014-05-19 NOTE — Telephone Encounter (Signed)
I have not seen him in > one year.  Do we know why he is requesting referral?

## 2014-05-31 ENCOUNTER — Encounter: Payer: Self-pay | Admitting: Endocrinology

## 2014-06-02 ENCOUNTER — Ambulatory Visit: Payer: BC Managed Care – PPO | Admitting: Endocrinology

## 2014-06-08 ENCOUNTER — Encounter: Payer: Self-pay | Admitting: Family Medicine

## 2014-06-11 ENCOUNTER — Encounter: Payer: Self-pay | Admitting: Endocrinology

## 2014-06-11 ENCOUNTER — Ambulatory Visit (INDEPENDENT_AMBULATORY_CARE_PROVIDER_SITE_OTHER): Payer: BC Managed Care – PPO | Admitting: Endocrinology

## 2014-06-11 VITALS — BP 122/80 | HR 87 | Temp 97.8°F | Ht 70.0 in | Wt 193.0 lb

## 2014-06-11 DIAGNOSIS — E1029 Type 1 diabetes mellitus with other diabetic kidney complication: Secondary | ICD-10-CM

## 2014-06-11 LAB — HEMOGLOBIN A1C: Hgb A1c MFr Bld: 11.2 % — ABNORMAL HIGH (ref 4.6–6.5)

## 2014-06-11 MED ORDER — INSULIN GLARGINE 100 UNIT/ML SOLOSTAR PEN: 150.0000 [IU] | PEN_INJECTOR | SUBCUTANEOUS | Status: DC

## 2014-06-11 NOTE — Progress Notes (Signed)
Subjective:    Patient ID: Richard Mendoza, male    DOB: [birth year], 38 y.o.   MRN: [id number]  HPI pt returns for f/u of insulin-requiring DM (dx'ed 2009; he has mild if any neuropathy of the lower extremities; he is unaware of any associated chronic complications.  he has been on insulin since 2010; he is on a simple insulin schedule, due to chronic severe noncompliance).  He has increased lantus to 130 units qd.  no cbg record, but states cbg's vary from 120-250.  It is in general higher as the day goes on.  Past Medical History  Diagnosis Date  . 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 DM 12/08/2008  . DIABETES MELLITUS, TYPE II, UNCONTROLLED 03/17/2009  . HYPERLIPIDEMIA 03/17/2009  . OBSTRUCTIVE SLEEP APNEA 12/08/2008  . HYPERTENSION 12/08/2008    Past Surgical History  Procedure Laterality Date  . Appendectomy      History   Social History  . Marital Status: Married    Spouse Name: N/A    Number of Children: N/A  . Years of Education: N/A   Occupational History  . Not on file.   Social History Main Topics  . Smoking status: Current Every Day Smoker -- 1.50 packs/day for 17 years    Types: Cigarettes  . Smokeless tobacco: Never Used     Comment: currently smoking 1 ppd.    . Alcohol Use: No  . Drug Use: No  . Sexual Activity: Not on file   Other Topics Concern  . Not on file   Social History Narrative  . No narrative on file    Current Outpatient Prescriptions on File Prior to Visit  Medication Sig Dispense Refill  . aspirin 325 MG tablet Take 325 mg by mouth daily.        . Blood Glucose Monitoring Suppl (ONETOUCH VERIO [facility] SYSTEM) W/DEVICE KIT 1 Device by Does not apply route once.  1 kit  0  . glucose blood (ONETOUCH VERIO) test strip 1 each by Other route 2 (two) times daily. And lancets 2/day 250.03  100 each  12  . Insulin Pen Needle (EASY TOUCH PEN NEEDLES) 31G X 8 MM MISC by Does not apply route.        . sildenafil (REVATIO) 20 MG tablet 1-5 pills as needed for  ED symptoms  50 tablet  11  . atorvastatin (LIPITOR) 20 MG tablet Take 1 tablet (20 mg total) by mouth daily.  30 tablet  6   No current facility-administered medications on file prior to visit.    No Known Allergies  Family History  Problem Relation Age of Onset  . Diabetes Mother   . Heart disease Father   . Diabetes Father   . Arthritis Other   . Hyperlipidemia Other   . Hypertension Other   . Cancer Other     breast    BP 122/80  Pulse 87  Temp(Src) 97.8 F (36.6 C) (Oral)  Ht 5' 10"  (1.778 m)  Wt 193 lb (87.544 kg)  BMI 27.69 kg/m2  SpO2 97%    Review of Systems Denies n/v/sob.  He has lost weight.     Objective:   Physical Exam Pulses: dorsalis pedis intact bilat.  Feet: no deformity. feet are of normal color and temp. no edema. Old healed scar at the tip of the right great toe Skin: no ulcer on the feet.  Neuro: sensation is intact to touch on the feet.    Lab Results  Component Value  Date   HGBA1C 11.2* 06/11/2014      Assessment & Plan:  DM: severe exacerbation despite increased insulin Weight-loss: i told pt and wife this is probably due to poor glycemic control.   Patient is advised the following: Patient Instructions  check your blood sugar 1-2 times a day.  vary the time of day when you check, between before the 3 meals, and at bedtime.  also check if you have symptoms of your blood sugar being too high or too low.  please keep a record of the readings and bring it to your next appointment here.  You can write it on any piece of paper.  please call us sooner if your blood sugar goes below 70, or if you have a lot of readings over 200.  For now, please increase lantus to 130 units of lantus at bedtime.   Please come back for a follow-up appointment in 3 months.

## 2014-06-11 NOTE — Patient Instructions (Addendum)
check your blood sugar 1-2 times a day.  vary the time of day when you check, between before the 3 meals, and at bedtime.  also check if you have symptoms of your blood sugar being too high or too low.  please keep a record of the readings and bring it to your next appointment here.  You can write it on any piece of paper.  please call us sooner if your blood sugar goes below 70, or if you have a lot of readings over 200.  For now, please increase lantus to 130 units of lantus at bedtime.   Please come back for a follow-up appointment in 3 months.

## 2014-06-25 ENCOUNTER — Encounter: Payer: Self-pay | Admitting: Endocrinology

## 2014-07-16 ENCOUNTER — Telehealth: Payer: Self-pay

## 2014-07-16 NOTE — Telephone Encounter (Signed)
Diabetic Bundle. Lvom to call back and schedule appointment.

## 2014-10-04 ENCOUNTER — Encounter: Payer: Self-pay | Admitting: Endocrinology

## 2014-10-08 ENCOUNTER — Ambulatory Visit (INDEPENDENT_AMBULATORY_CARE_PROVIDER_SITE_OTHER): Payer: BC Managed Care – PPO | Admitting: Endocrinology

## 2014-10-08 ENCOUNTER — Encounter: Payer: Self-pay | Admitting: Endocrinology

## 2014-10-08 VITALS — BP 132/84 | HR 80 | Temp 97.9°F | Ht 70.0 in | Wt 191.0 lb

## 2014-10-08 DIAGNOSIS — E1021 Type 1 diabetes mellitus with diabetic nephropathy: Secondary | ICD-10-CM

## 2014-10-08 LAB — HEMOGLOBIN A1C: Hgb A1c MFr Bld: 11.2 % — ABNORMAL HIGH (ref 4.6–6.5)

## 2014-10-08 NOTE — Progress Notes (Signed)
Subjective:    Patient ID: Richard Mendoza, male    DOB: May 16, 1976, 38 y.o.   MRN: [id number]  HPI  Pt returns for f/u of diabetes mellitus: DM type: Insulin-requiring type 2 Dx'ed: 2863 Complications: none Therapy: insulin since 2010 DKA: never Severe hypoglycemia: never Pancreatitis: never Other: he is on a simple insulin schedule, due to chronic severe noncompliance Interval history: pt states he feels well in general, except for myalgias and arthralgias.  no cbg record, but states cbg's vary from 200-400.  There is no trend throughout the day.   Past Medical History  Diagnosis Date  . 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 DM 12/08/2008  . DIABETES MELLITUS, TYPE II, UNCONTROLLED 03/17/2009  . HYPERLIPIDEMIA 03/17/2009  . OBSTRUCTIVE SLEEP APNEA 12/08/2008  . HYPERTENSION 12/08/2008    Past Surgical History  Procedure Laterality Date  . Appendectomy      History   Social History  . Marital Status: Married    Spouse Name: N/A    Number of Children: N/A  . Years of Education: N/A   Occupational History  . Not on file.   Social History Main Topics  . Smoking status: Current Every Day Smoker -- 1.50 packs/day for 17 years    Types: Cigarettes  . Smokeless tobacco: Never Used     Comment: currently smoking 1 ppd.    . Alcohol Use: No  . Drug Use: No  . Sexual Activity: Not on file   Other Topics Concern  . Not on file   Social History Narrative    Current Outpatient Prescriptions on File Prior to Visit  Medication Sig Dispense Refill  . aspirin 325 MG tablet Take 325 mg by mouth daily.      . Blood Glucose Monitoring Suppl (ONETOUCH VERIO [facility] SYSTEM) W/DEVICE KIT 1 Device by Does not apply route once. 1 kit 0  . glucose blood (ONETOUCH VERIO) test strip 1 each by Other route 2 (two) times daily. And lancets 2/day 250.03 100 each 12  . Insulin Pen Needle (EASY TOUCH PEN NEEDLES) 31G X 8 MM MISC by Does not apply route.      . sildenafil (REVATIO) 20 MG tablet 1-5 pills  as needed for ED symptoms 50 tablet 11  . atorvastatin (LIPITOR) 20 MG tablet Take 1 tablet (20 mg total) by mouth daily. 30 tablet 6   No current facility-administered medications on file prior to visit.    No Known Allergies  Family History  Problem Relation Age of Onset  . Diabetes Mother   . Heart disease Father   . Diabetes Father   . Arthritis Other   . Hyperlipidemia Other   . Hypertension Other   . Cancer Other     breast    BP 132/84 mmHg  Pulse 80  Temp(Src) 97.9 F (36.6 C) (Oral)  Ht 5' 10"  (1.778 m)  Wt 191 lb (86.637 kg)  BMI 27.41 kg/m2  SpO2 97%  Review of Systems He denies hypoglycemia and weight change    Objective:   Physical Exam VITAL SIGNS:  See vs page GENERAL: no distress Pulses: dorsalis pedis intact bilat.   Feet: no deformity. feet are of normal color and temp. no edema.   Skin: no ulcer on the feet.   Neuro: sensation is intact to touch on the feet.    Lab Results  Component Value Date   HGBA1C 11.2* 10/08/2014      Assessment & Plan:  DM: severe exacerbation.   Patient is  advised the following: Patient Instructions  Please see Dr Elease Hashimoto about your pain and numbness.   check your blood sugar 1-2 times a day.  vary the time of day when you check, between before the 3 meals, and at bedtime.  also check if you have symptoms of your blood sugar being too high or too low.  please keep a record of the readings and bring it to your next appointment here.  You can write it on any piece of paper.  please call us sooner if your blood sugar goes below 70, or if you have a lot of readings over 200.  blood tests are being requested for you today.  We'll let you know about the results. Based on the results, we probably need to increase the insulin.   Please come back for a follow-up appointment in 3 months.     Please increase the lantus to 200 units each morning.

## 2014-10-08 NOTE — Patient Instructions (Addendum)
Please see Dr Caryl NeverBurchette about your pain and numbness.   check your blood sugar 1-2 times a day.  vary the time of day when you check, between before the 3 meals, and at bedtime.  also check if you have symptoms of your blood sugar being too high or too low.  please keep a record of the readings and bring it to your next appointment here.  You can write it on any piece of paper.  please call us sooner if your blood sugar goes below 70, or if you have a lot of readings over 200.  blood tests are being requested for you today.  We'll let you know about the results. Based on the results, we probably need to increase the insulin.   Please come back for a follow-up appointment in 3 months.

## 2015-02-05 ENCOUNTER — Emergency Department (HOSPITAL_COMMUNITY): Payer: Self-pay

## 2015-02-05 ENCOUNTER — Encounter (HOSPITAL_COMMUNITY): Payer: Self-pay | Admitting: Emergency Medicine

## 2015-02-05 ENCOUNTER — Emergency Department (HOSPITAL_COMMUNITY)
Admission: EM | Admit: 2015-02-05 | Discharge: 2015-02-06 | Disposition: A | Discharge status: Home / Self Care | Payer: Self-pay | Attending: Emergency Medicine | Admitting: Emergency Medicine

## 2015-02-05 DIAGNOSIS — Z8669 Personal history of other diseases of the nervous system and sense organs: Secondary | ICD-10-CM | POA: Insufficient documentation

## 2015-02-05 DIAGNOSIS — E785 Hyperlipidemia, unspecified: Secondary | ICD-10-CM | POA: Insufficient documentation

## 2015-02-05 DIAGNOSIS — R079 Chest pain, unspecified: Secondary | ICD-10-CM | POA: Insufficient documentation

## 2015-02-05 DIAGNOSIS — Z720 Tobacco use: Secondary | ICD-10-CM | POA: Insufficient documentation

## 2015-02-05 DIAGNOSIS — Z7982 Long term (current) use of aspirin: Secondary | ICD-10-CM | POA: Insufficient documentation

## 2015-02-05 DIAGNOSIS — Z8619 Personal history of other infectious and parasitic diseases: Secondary | ICD-10-CM | POA: Insufficient documentation

## 2015-02-05 DIAGNOSIS — I10 Essential (primary) hypertension: Secondary | ICD-10-CM | POA: Insufficient documentation

## 2015-02-05 DIAGNOSIS — R739 Hyperglycemia, unspecified: Secondary | ICD-10-CM

## 2015-02-05 DIAGNOSIS — Z794 Long term (current) use of insulin: Secondary | ICD-10-CM | POA: Insufficient documentation

## 2015-02-05 DIAGNOSIS — E1165 Type 2 diabetes mellitus with hyperglycemia: Secondary | ICD-10-CM | POA: Insufficient documentation

## 2015-02-05 LAB — I-STAT TROPONIN, ED: TROPONIN I, POC: 0 ng/mL (ref 0.00–0.08)

## 2015-02-05 LAB — CBC
HCT: 45.3 % (ref 39.0–52.0)
Hemoglobin: 15.9 g/dL (ref 13.0–17.0)
MCH: 30.2 pg (ref 26.0–34.0)
MCHC: 35.1 g/dL (ref 30.0–36.0)
MCV: 86.1 fL (ref 78.0–100.0)
Platelets: 196 10*3/uL (ref 150–400)
RBC: 5.26 MIL/uL (ref 4.22–5.81)
RDW: 13 % (ref 11.5–15.5)
WBC: 7.6 10*3/uL (ref 4.0–10.5)

## 2015-02-05 LAB — BASIC METABOLIC PANEL
Anion gap: 4 — ABNORMAL LOW (ref 5–15)
BUN: 6 mg/dL (ref 6–23)
CALCIUM: 9.3 mg/dL (ref 8.4–10.5)
CO2: 33 mmol/L — AB (ref 19–32)
Chloride: 98 mmol/L (ref 96–112)
Creatinine, Ser: 0.63 mg/dL (ref 0.50–1.35)
GFR calc Af Amer: >90 mL/min (ref >90)
Glucose, Bld: 394 mg/dL — ABNORMAL HIGH (ref 70–99)
POTASSIUM: 3.6 mmol/L (ref 3.5–5.1)
Sodium: 135 mmol/L (ref 135–145)

## 2015-02-05 LAB — CBG MONITORING, ED: Glucose-Capillary: 415 mg/dL — ABNORMAL HIGH (ref 70–99)

## 2015-02-05 MED ORDER — ASPIRIN 81 MG PO CHEW
324.0000 mg | CHEWABLE_TABLET | Freq: Once | ORAL | Status: AC
  Administered 2015-02-05: 324 mg via ORAL
  Filled 2015-02-05: qty 4

## 2015-02-05 MED ORDER — INSULIN ASPART 100 UNIT/ML ~~LOC~~ SOLN
10.0000 [IU] | Freq: Once | SUBCUTANEOUS | Status: AC
  Administered 2015-02-05: 10 [IU] via INTRAVENOUS
  Filled 2015-02-05: qty 1

## 2015-02-05 MED ORDER — SODIUM CHLORIDE 0.9 % IV BOLUS (SEPSIS)
1000.0000 mL | Freq: Once | INTRAVENOUS | Status: AC
  Administered 2015-02-05: 1000 mL via INTRAVENOUS

## 2015-02-05 NOTE — ED Notes (Signed)
Patient here with complaint of left jaw tightness, left chest pain with radiation to axilla and shoulder, and left arm numbness. Denies other anginal equivalents. States he hasn't felt well since getting home today around 1730 and pain began around 1900. Previous incidence of similar symptoms, had stress test, was determined to be dehydration.

## 2015-02-06 LAB — I-STAT TROPONIN, ED: Troponin i, poc: 0 ng/mL (ref 0.00–0.08)

## 2015-02-06 LAB — CBG MONITORING, ED: Glucose-Capillary: 251 mg/dL — ABNORMAL HIGH (ref 70–99)

## 2015-02-06 NOTE — Discharge Instructions (Signed)
Return to the emergency room with worsening of symptoms, new symptoms or with symptoms that are concerning , especially chest pain that feels like a pressure, spreads to left arm or jaw, worse with exertion, associated with nausea, vomiting, shortness of breath and/or sweating.  °Please call your doctor for a followup appointment within 24-48 hours. When you talk to your doctor please let them know that you were seen in the emergency department and have them acquire all of your records so that they can discuss the findings with you and formulate a treatment plan to fully care for your new and ongoing problems. °Read below information and follow recommendations. ° °Chest Pain (Nonspecific) °It is often hard to give a specific diagnosis for the cause of chest pain. There is always a chance that your pain could be related to something serious, such as a heart attack or a blood clot in the lungs. You need to follow up with your health care provider for further evaluation. °CAUSES  °· Heartburn. °· Pneumonia or bronchitis. °· Anxiety or stress. °· Inflammation around your heart (pericarditis) or lung (pleuritis or pleurisy). °· A blood clot in the lung. °· A collapsed lung (pneumothorax). It can develop suddenly on its own (spontaneous pneumothorax) or from trauma to the chest. °· Shingles infection (herpes zoster virus). °The chest wall is composed of bones, muscles, and cartilage. Any of these can be the source of the pain. °· The bones can be bruised by injury. °· The muscles or cartilage can be strained by coughing or overwork. °· The cartilage can be affected by inflammation and become sore (costochondritis). °DIAGNOSIS  °Lab tests or other studies may be needed to find the cause of your pain. Your health care provider may have you take a test called an ambulatory electrocardiogram (ECG). An ECG records your heartbeat patterns over a 24-hour period. You may also have other tests, such as: °· Transthoracic  echocardiogram (TTE). During echocardiography, sound waves are used to evaluate how blood flows through your heart. °· Transesophageal echocardiogram (TEE). °· Cardiac monitoring. This allows your health care provider to monitor your heart rate and rhythm in real time. °· Holter monitor. This is a portable device that records your heartbeat and can help diagnose heart arrhythmias. It allows your health care provider to track your heart activity for several days, if needed. °· Stress tests by exercise or by giving medicine that makes the heart beat faster. °TREATMENT  °· Treatment depends on what may be causing your chest pain. Treatment may include: °¨ Acid blockers for heartburn. °¨ Anti-inflammatory medicine. °¨ Pain medicine for inflammatory conditions. °¨ Antibiotics if an infection is present. °· You may be advised to change lifestyle habits. This includes stopping smoking and avoiding alcohol, caffeine, and chocolate. °· You may be advised to keep your head raised (elevated) when sleeping. This reduces the chance of acid going backward from your stomach into your esophagus. °Most of the time, nonspecific chest pain will improve within 2-3 days with rest and mild pain medicine.  °HOME CARE INSTRUCTIONS  °· If antibiotics were prescribed, take them as directed. Finish them even if you start to feel better. °· For the next few days, avoid physical activities that bring on chest pain. Continue physical activities as directed. °· Do not use any tobacco products, including cigarettes, chewing tobacco, or electronic cigarettes. °· Avoid drinking alcohol. °· Only take medicine as directed by your health care provider. °· Follow your health care provider's suggestions for further testing   if your chest pain does not go away. °· Keep any follow-up appointments you made. If you do not go to an appointment, you could develop lasting (chronic) problems with pain. If there is any problem keeping an appointment, call to  reschedule. °SEEK MEDICAL CARE IF:  °· Your chest pain does not go away, even after treatment. °· You have a rash with blisters on your chest. °· You have a fever. °SEEK IMMEDIATE MEDICAL CARE IF:  °· You have increased chest pain or pain that spreads to your arm, neck, jaw, back, or abdomen. °· You have shortness of breath. °· You have an increasing cough, or you cough up blood. °· You have severe back or abdominal pain. °· You feel nauseous or vomit. °· You have severe weakness. °· You faint. °· You have chills. °This is an emergency. Do not wait to see if the pain will go away. Get medical help at once. Call your local emergency services (911 in [place] Do not drive yourself to the hospital. °MAKE SURE YOU:  °· Understand these instructions. °· Will watch your condition. °· Will get help right away if you are not doing well or get worse. °Document Released: 08/15/2005 Document Revised: 11/10/2013 Document Reviewed: 06/10/2008 °[employer]® Patient Information ©2015 [employer]. This information is not intended to replace advice given to you by your health care provider. Make sure you discuss any questions you have with your health care provider. ° ° °

## 2015-02-06 NOTE — ED Provider Notes (Signed)
CSN: [id number]     Arrival date & time 02/05/15  2210 History   First MD Initiated Contact with Patient 02/05/15 2223     Chief Complaint  Patient presents with  . Chest Pain     (Consider location/radiation/quality/duration/timing/severity/associated sxs/prior Treatment) HPI  Richard Mendoza is a 39 y.o. male with PMH of diabetes, hyperlipidemia, hypertension presenting with intermittent sharp chest pain that started around 1900 that occurs at rest and lasts for 15 minutes. At times radiates into left jaw axilla and shoulder. He does at times report arm tingling and numbness. No shortness of breath, nausea, vomiting, diaphoresis. No exertional symptoms. Patient has had similar symptoms in the past stress test that was unremarkable. Pt works lying tile.    Past Medical History  Diagnosis Date  . 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 DM 12/08/2008  . DIABETES MELLITUS, TYPE II, UNCONTROLLED 03/17/2009  . HYPERLIPIDEMIA 03/17/2009  . OBSTRUCTIVE SLEEP APNEA 12/08/2008  . HYPERTENSION 12/08/2008   Past Surgical History  Procedure Laterality Date  . Appendectomy     Family History  Problem Relation Age of Onset  . Diabetes Mother   . Heart disease Father   . Diabetes Father   . Arthritis Other   . Hyperlipidemia Other   . Hypertension Other   . Cancer Other     breast   History  Substance Use Topics  . Smoking status: Current Every Day Smoker -- 1.00 packs/day for 17 years    Types: Cigarettes  . Smokeless tobacco: Never Used     Comment: currently smoking 1 ppd.    . Alcohol Use: No    Review of Systems 10 Systems reviewed and are negative for acute change except as noted in the HPI.    Allergies  Review of patient's allergies indicates no known allergies.  Home Medications   Prior to Admission medications   Medication Sig Start Date End Date Taking? Authorizing Provider  aspirin 325 MG tablet Take 325 mg by mouth daily.     Yes Historical Provider, MD  insulin glargine  (LANTUS) 100 UNIT/ML injection Inject 200 Units into the skin every morning.   Yes Historical Provider, MD  sildenafil (REVATIO) 20 MG tablet 1-5 pills as needed for ED symptoms 01/20/14  Yes Romero BellingSean Ellison, MD  atorvastatin (LIPITOR) 20 MG tablet Take 1 tablet (20 mg total) by mouth daily. 10/30/11 10/29/12  Kristian CoveyBruce W Burchette, MD   BP 127/82 mmHg  Pulse 67  Temp(Src) 97.9 F (36.6 C) (Oral)  Resp 12  Ht 5\' 10"  (1.778 m)  Wt 200 lb (90.719 kg)  BMI 28.70 kg/m2  SpO2 100% Physical Exam  Constitutional: He appears well-developed and well-nourished. No distress.  HENT:  Head: Normocephalic and atraumatic.  Eyes: Conjunctivae and EOM are normal. Right eye exhibits no discharge. Left eye exhibits no discharge.  Neck: No JVD present.  Cardiovascular: Normal rate and regular rhythm.   No leg swelling or tenderness. Negative Homan's sign.  Pulmonary/Chest: Effort normal and breath sounds normal. No respiratory distress. He has no wheezes.  Abdominal: Soft. Bowel sounds are normal. He exhibits no distension. There is no tenderness.  Neurological: He is alert. He exhibits normal muscle tone. Coordination normal.  Skin: Skin is warm and dry. He is not diaphoretic.  Nursing note and vitals reviewed.   ED Course  Procedures (including critical care time) Labs Review Labs Reviewed  BASIC METABOLIC PANEL - Abnormal; Notable for the following:    CO2 33 (*)    Glucose,  Bld 394 (*)    Anion gap 4 (*)    All other components within normal limits  CBG MONITORING, ED - Abnormal; Notable for the following:    Glucose-Capillary 415 (*)    All other components within normal limits  CBC  I-STAT TROPOININ, ED  CBG MONITORING, ED    Imaging Review Dg Chest 2 View  02/05/2015   CLINICAL DATA:  Left chest pain radiating to jaw and shoulder  EXAM: CHEST  2 VIEW  COMPARISON:  06/30/2008  FINDINGS: The heart size and mediastinal contours are within normal limits. Both lungs are clear. The visualized  skeletal structures are unremarkable.  IMPRESSION: No active cardiopulmonary disease.   Electronically Signed   By: Ellery Plunk M.D.   On: 02/05/2015 23:11     EKG Interpretation   Date/Time:  Saturday February 05 2015 22:15:32 EDT Ventricular Rate:  79 PR Interval:  150 QRS Duration: 94 QT Interval:  388 QTC Calculation: 444 R Axis:   -12 Text Interpretation:  Normal sinus rhythm Cannot rule out Anterior infarct  , age undetermined Abnormal ECG No significant change since last tracing  Confirmed by OTTER  MD, OLGA ([id number]) on 02/06/2015 12:19:50 AM      MDM   Final diagnoses:  Chest pain, unspecified chest pain type  Hyperglycemia   Chest pain is not likely of cardiac or pulmonary etiology d/t presentation, PERC negative, HEART score 3. VSS no JVD or new murmur, RRR, breath sounds equal bilaterally, EKG without acute abnormalities, negative troponin and negative CXR. Pt pain free. Plan for delta troponin and follow up with PCP. Pt appears reliable for follow up. Pt also with hyperglycemia and reports noncompliance with medications. IV fluids and insulin ordered. Discussed the importance of taking insulin and the detrimental effects of hyperglycemia over the long-term. Patient verbalizes understanding.  Discussed all results and patient verbalizes understanding and agrees with plan.  Pt signed out to [employer] at shift change.  Plan: delta troponin and decreased hyperglycemia. Follow up with PCP in 2 days.    Oswaldo Conroy, PA-C 02/06/15 6045  Marisa Severin, MD 02/06/15 [phone]

## 2015-02-06 NOTE — ED Provider Notes (Signed)
Delta trop around 1:30 No chest pain No cardiac history, normal EKG Heart Score 2-3   Delta troponin negative. Discharge per plan to follow up with PCP for recheck.   Elpidio AnisShari Locke Barrell, PA-C 02/06/15 [phone]  Marisa Severinlga Otter, MD 02/06/15 [phone]

## 2015-03-17 ENCOUNTER — Encounter: Payer: Self-pay | Admitting: Endocrinology

## 2015-03-18 ENCOUNTER — Other Ambulatory Visit: Payer: Self-pay | Admitting: Endocrinology

## 2015-03-18 ENCOUNTER — Other Ambulatory Visit: Payer: Self-pay

## 2015-03-18 MED ORDER — SILDENAFIL CITRATE 20 MG PO TABS: ORAL_TABLET | ORAL | Status: DC

## 2015-05-10 ENCOUNTER — Encounter: Payer: Self-pay | Admitting: Endocrinology

## 2015-05-11 ENCOUNTER — Other Ambulatory Visit: Payer: Self-pay

## 2015-05-11 MED ORDER — SILDENAFIL CITRATE 20 MG PO TABS: ORAL_TABLET | ORAL | Status: DC

## 2015-05-12 ENCOUNTER — Encounter: Payer: Self-pay | Admitting: Endocrinology

## 2015-05-16 ENCOUNTER — Other Ambulatory Visit: Payer: Self-pay

## 2015-05-17 ENCOUNTER — Telehealth: Payer: Self-pay

## 2015-05-17 NOTE — Telephone Encounter (Signed)
Left a voicemail advising the pt to call our office back and schedule a office visit. Office visit is needed before patient assistance forms can be completed.

## 2015-08-05 ENCOUNTER — Ambulatory Visit: Payer: Self-pay | Admitting: Endocrinology

## 2015-09-26 ENCOUNTER — Other Ambulatory Visit: Payer: Self-pay

## 2015-09-26 ENCOUNTER — Encounter: Payer: Self-pay | Admitting: Endocrinology

## 2015-09-26 MED ORDER — SILDENAFIL CITRATE 20 MG PO TABS: ORAL_TABLET | ORAL | Status: DC

## 2015-09-29 ENCOUNTER — Telehealth: Payer: Self-pay | Admitting: Family Medicine

## 2015-09-29 DIAGNOSIS — E119 Type 2 diabetes mellitus without complications: Secondary | ICD-10-CM

## 2015-09-29 NOTE — Telephone Encounter (Signed)
Pt states it is too far for him to travel to see Dr Everardo AllEllison and would like referral to another   endocrinologist. [place] and cannot make it back to appointment is [place] on time. To [facility]ilda [facility]andaMelissa [facility] [address] [place], [place] [phone]  Pt not seen Dr Caryl NeverBurchette since 06/25/2012. However, saw dr Clent RidgesFry in 2014 for an acute issue.

## 2015-09-29 NOTE — Telephone Encounter (Signed)
OK to refer.

## 2015-09-30 NOTE — Telephone Encounter (Signed)
Referral placed.

## 2015-12-28 ENCOUNTER — Encounter: Payer: Self-pay | Admitting: Family Medicine

## 2015-12-28 ENCOUNTER — Ambulatory Visit (INDEPENDENT_AMBULATORY_CARE_PROVIDER_SITE_OTHER): Payer: 59 | Admitting: Family Medicine

## 2015-12-28 VITALS — BP 110/88 | HR 88 | Temp 97.7°F | Ht 70.0 in | Wt 192.8 lb

## 2015-12-28 DIAGNOSIS — M5412 Radiculopathy, cervical region: Secondary | ICD-10-CM | POA: Diagnosis not present

## 2015-12-28 MED ORDER — HYDROCODONE-ACETAMINOPHEN 5-325 MG PO TABS: 1.0000 | ORAL_TABLET | Freq: Four times a day (QID) | ORAL | Status: DC | PRN

## 2015-12-28 NOTE — Progress Notes (Signed)
   Subjective:    Patient ID: Richard Mendoza, male    DOB: 1976-07-03, 40 y.o.   MRN: [id number]  HPI  patient is seen with left cervical neck pain for the past few days. No reported injury. He has type 2 diabetes insulin-dependent which is currently followed by endocrinologist in [facility]. Takes very high-dose insulin and has long history of poor diabetes compliance and very poor control.   He relates achy to sharp pain which radiates from the base of the cervical spine toward the scapular region but also down toward the hand involving the ring finger. Question of some mild swelling of the ring finger. He rates his pain 7-8 out of 10 severity at its worse. Worse after prolonged periods of inactivity. No chest pains. Already takes gabapentin 300 mg daily at bedtime for diabetic neuropathy symptoms. Denies any prior history of cervical disc problems.  No alleviating factors.   Question of some mild weakness intermittently recently involving left upper extremity. No numbness  Past Medical History  Diagnosis Date  . 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 DM 12/08/2008  . DIABETES MELLITUS, TYPE II, UNCONTROLLED 03/17/2009  . HYPERLIPIDEMIA 03/17/2009  . OBSTRUCTIVE SLEEP APNEA 12/08/2008  . HYPERTENSION 12/08/2008   Past Surgical History  Procedure Laterality Date  . Appendectomy      reports that he has been smoking Cigarettes.  He has a 17 pack-year smoking history. He has never used smokeless tobacco. He reports that he does not drink alcohol or use illicit drugs. family history includes Arthritis in his other; Cancer in his other; Diabetes in his father and mother; Heart disease in his father; Hyperlipidemia in his other; Hypertension in his other. No Known Allergies    Review of Systems  Constitutional: Negative for fever and chills.  Eyes: Negative for visual disturbance.  Respiratory: Negative for shortness of breath.   Cardiovascular: Negative for chest pain.    Musculoskeletal: Positive for neck pain. Negative for neck stiffness.  Skin: Negative for rash.  Neurological: Positive for weakness. Negative for numbness and headaches.  Hematological: Negative for adenopathy.       Objective:   Physical Exam  Constitutional: He appears well-developed and well-nourished.  Cardiovascular: Normal rate and regular rhythm.   Both hands are warm to touch with excellent capillary refill. He has excellent radial and ulnar pulses bilaterally  Pulmonary/Chest: Effort normal and breath sounds normal. No respiratory distress. He has no wheezes. He has no rales.  Musculoskeletal: He exhibits no edema.  No muscle atrophy  Neurological:  Full strength upper extremities. Symmetric reflexes. Normal sensory function to touch          Assessment & Plan:  Left cervical radiculopathy pains. Nonfocal neurologic exam. Would avoid prednisone with history of poorly controlled type 2 diabetes. Avoid regular use of nonsteroidals with his diabetes. Short-term only use of Aleve or Advil. Wrote for limited hydrocodone 5 mg #30 one to 2 daily at bedtime for severe pain. Observe for now. Follow-up promptly if he has any weakness or progressive symptoms or if pain not improved in 3-4 weeks

## 2015-12-28 NOTE — Patient Instructions (Signed)

## 2016-01-15 ENCOUNTER — Encounter: Payer: Self-pay | Admitting: Family Medicine

## 2016-01-16 ENCOUNTER — Telehealth: Payer: Self-pay | Admitting: Family Medicine

## 2016-01-16 NOTE — Telephone Encounter (Signed)
Pt call to say that his shoulder is not any better and was told to call back if not better

## 2016-01-16 NOTE — Telephone Encounter (Signed)
i suspect he has a cervical radiculitis.  Unfortunately, I do not think he has coverage for MRI.  Confirm if he has coverage.  If so, I would go ahead with MRI cervical spine without contrast.

## 2016-01-17 ENCOUNTER — Other Ambulatory Visit: Payer: Self-pay | Admitting: Family Medicine

## 2016-01-17 DIAGNOSIS — M5412 Radiculopathy, cervical region: Secondary | ICD-10-CM

## 2016-01-17 NOTE — Telephone Encounter (Signed)
Wife is aware and will call the insurance company.  Patient will run out his pain medication soon. He as been taking 2 pain pills every 6 hours. Please advise.

## 2016-01-17 NOTE — Telephone Encounter (Signed)
That is a billing issue I cannot answer.   Will direct to [place].

## 2016-01-17 NOTE — Telephone Encounter (Signed)
Pt's insurance will cover, but they have not met their deductible. ($5000) Pt would like to know if they can set up payment arrangements.  So pt would like to proceed.

## 2016-01-17 NOTE — Telephone Encounter (Signed)
I will call pt in the am and advise pt will need to set up payment plan with whomever the MRI is with. But we still need an order.

## 2016-01-18 ENCOUNTER — Other Ambulatory Visit: Payer: Self-pay | Admitting: Family Medicine

## 2016-01-18 NOTE — Telephone Encounter (Signed)
Spoke with wife and she advised someone already set up xray at [facility], pt will go tomorrow.

## 2016-01-18 NOTE — Telephone Encounter (Signed)
Rx must be fill by 01/25/16

## 2016-01-18 NOTE — Telephone Encounter (Signed)
Pt needs a rx for  HYDROcodone-acetaminophen (NORCO/VICODIN) 5-325 MG tablet Or percocet  Pt has to continue to work and will be out of any pain meds soon.   Pt is going for xray at [facility] in the morning.

## 2016-01-19 ENCOUNTER — Ambulatory Visit (INDEPENDENT_AMBULATORY_CARE_PROVIDER_SITE_OTHER)
Admission: RE | Admit: 2016-01-19 | Discharge: 2016-01-19 | Disposition: A | Discharge status: Home / Self Care | Payer: 59 | Source: Ambulatory Visit | Attending: Family Medicine | Admitting: Family Medicine

## 2016-01-19 DIAGNOSIS — M5412 Radiculopathy, cervical region: Secondary | ICD-10-CM

## 2016-01-20 ENCOUNTER — Telehealth: Payer: Self-pay | Admitting: Family Medicine

## 2016-01-20 NOTE — Telephone Encounter (Signed)
Sent to Dr. Caryl NeverBurchette in error. See result notes.

## 2016-01-20 NOTE — Telephone Encounter (Addendum)
Pt wife would like  Shoulder xray results

## 2016-01-20 NOTE — Telephone Encounter (Signed)
Im not seeing this results on my end.

## 2016-01-24 ENCOUNTER — Telehealth: Payer: Self-pay | Admitting: Family Medicine

## 2016-01-24 MED ORDER — HYDROCODONE-ACETAMINOPHEN 5-325 MG PO TABS: 1.0000 | ORAL_TABLET | Freq: Four times a day (QID) | ORAL | Status: DC | PRN

## 2016-01-24 NOTE — Telephone Encounter (Signed)
Pt is wife is calling requesting mri. Please put order in system

## 2016-01-24 NOTE — Telephone Encounter (Signed)
Hydrocodone printed for signature. 

## 2016-01-24 NOTE — Telephone Encounter (Signed)
See results note. 

## 2016-01-25 NOTE — Telephone Encounter (Signed)
Pt's wife is aware that RX is up front for pick up.

## 2016-06-21 ENCOUNTER — Encounter: Payer: Self-pay | Admitting: Family Medicine

## 2016-06-21 ENCOUNTER — Telehealth: Payer: Self-pay

## 2016-06-21 NOTE — Telephone Encounter (Signed)
Left message for pt to call back  °

## 2016-06-21 NOTE — Telephone Encounter (Signed)
Can you reach out to this pt and schedule him a surgical clearance for Left shoulder Surgery. After scheduled please route back to me. Thanks.

## 2016-06-22 ENCOUNTER — Encounter: Payer: Self-pay | Admitting: Family Medicine

## 2016-06-22 ENCOUNTER — Ambulatory Visit (INDEPENDENT_AMBULATORY_CARE_PROVIDER_SITE_OTHER): Payer: Medicaid Other | Admitting: Family Medicine

## 2016-06-22 VITALS — BP 120/88 | HR 98 | Temp 98.1°F | Ht 70.0 in | Wt 196.0 lb

## 2016-06-22 DIAGNOSIS — Z01818 Encounter for other preprocedural examination: Secondary | ICD-10-CM

## 2016-06-22 DIAGNOSIS — E1021 Type 1 diabetes mellitus with diabetic nephropathy: Secondary | ICD-10-CM

## 2016-06-22 DIAGNOSIS — I10 Essential (primary) hypertension: Secondary | ICD-10-CM | POA: Diagnosis not present

## 2016-06-22 NOTE — Progress Notes (Signed)
Subjective:     Patient ID: Richard Mendoza, male   DOB: 09-29-1976, 40 y.o.   MRN: [id number]  HPI Patient seen for presurgical clearance. He will be undergoing left shoulder surgery with arthroscopic decompression and possible rotator cuff repair next week. He's had some left shoulder pains chronically for several months. Past medical problem including hypertension, obstructive sleep apnea, type 1 diabetes, and dyslipidemia. He has no history of known coronary disease.  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 Does smoke 1 pack cigarettes per day. He is followed by endocrinology regarding his diabetes  Past Medical History:  Diagnosis Date  . DIABETES MELLITUS, TYPE II, UNCONTROLLED 03/17/2009  . DM 12/08/2008  . HYPERLIPIDEMIA 03/17/2009  . HYPERTENSION 12/08/2008  . OBSTRUCTIVE SLEEP APNEA 12/08/2008  . 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 APPENDECTOMY      reports that he has been smoking Cigarettes.  He has a 17.00 pack-year smoking history. He has never used smokeless tobacco. He reports that he does not drink alcohol or use drugs. family history includes Arthritis in his other; Cancer in his other; Diabetes in his father and mother; Heart disease in his father; Hyperlipidemia in his other; Hypertension in his other. No Known Allergies    Review of Systems  Constitutional: Negative for chills, fatigue and fever.  Eyes: Negative for visual disturbance.  Respiratory: Negative for cough, chest tightness and shortness of breath.   Cardiovascular: Negative for chest pain, palpitations and leg swelling.  Neurological: Negative for dizziness, syncope, weakness, light-headedness and headaches.       Objective:   Physical Exam  Constitutional: He is oriented to person, place, and time. He appears well-developed and well-nourished.  Neck: Neck supple. No thyromegaly  present.  No carotid bruits  Cardiovascular: Normal rate and regular rhythm.  Exam reveals no gallop.   No murmur heard. Pulmonary/Chest: Effort normal and breath sounds normal. No respiratory distress. He has no wheezes. He has no rales.  Abdominal: Soft. Bowel sounds are normal. He exhibits no distension and no mass. There is no tenderness. There is no rebound and no guarding.  Musculoskeletal: He exhibits no edema.  Neurological: He is alert and oriented to person, place, and time. No cranial nerve deficit.       Assessment:     Patient seen for presurgical clearance for left shoulder surgery as above. He has type 1 diabetes with history of poor control until recently. He is followed by endocrinology. No history of known CAD or pulmonary disease    Plan:     -EKG shows sinus rhythm with no acute ST-T changes -No known medical contraindications for shoulder surgery -Clearance orders will be written and he will require close monitoring of blood sugars pre-and postop  Kristian Covey MD [facility]  -

## 2016-06-22 NOTE — Progress Notes (Signed)
Pre visit review using our clinic review tool, if applicable. No additional management support is needed unless otherwise documented below in the visit note. 

## 2016-06-22 NOTE — Telephone Encounter (Signed)
Pt has been scheduled 8/03. Surgery is 8/9.

## 2016-07-04 ENCOUNTER — Encounter: Payer: Self-pay | Admitting: Physical Therapy

## 2016-07-04 ENCOUNTER — Ambulatory Visit: Payer: Medicaid Other | Attending: Orthopedic Surgery | Admitting: Physical Therapy

## 2016-07-04 DIAGNOSIS — M25612 Stiffness of left shoulder, not elsewhere classified: Secondary | ICD-10-CM

## 2016-07-04 DIAGNOSIS — Z9889 Other specified postprocedural states: Secondary | ICD-10-CM

## 2016-07-04 DIAGNOSIS — M25512 Pain in left shoulder: Secondary | ICD-10-CM

## 2016-07-04 DIAGNOSIS — M6281 Muscle weakness (generalized): Secondary | ICD-10-CM | POA: Diagnosis present

## 2016-07-04 NOTE — Therapy (Signed)
[facility] [address] [place], [place], [address] Phone: [phone]   Fax:  [phone]  Physical Therapy Evaluation  Patient Details  Name: Richard Mendoza MRN: [id number] Date of Birth: [birth year] Referring Provider: Milly Jakob MD  Encounter Date: 07/04/2016      PT End of Session - 07/04/16 1300    Visit Number 1   Number of Visits 4   Date for PT Re-Evaluation 08/29/16   Authorization Type Medicaid   Authorization - Visit Number 1   Authorization - Number of Visits 4   PT Start Time 1015   PT Stop Time 1101   PT Time Calculation (min) 46 min   Activity Tolerance Patient tolerated treatment well   Behavior During Therapy [facility] for tasks assessed/performed      Past Medical History:  Diagnosis Date  . DIABETES MELLITUS, TYPE II, UNCONTROLLED 03/17/2009  . DM 12/08/2008  . HYPERLIPIDEMIA 03/17/2009  . HYPERTENSION 12/08/2008  . Hypertension   . OBSTRUCTIVE SLEEP APNEA 12/08/2008  . 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 APPENDECTOMY      There were no vitals filed for this visit.       Subjective Assessment - 07/04/16 1025    Subjective pt is a 40 y.o M with CC of L shoulder pain s/p L RCR on 06/27/2016 secondary to pain that gradually worsened for the course of the last couple of years. Since surgery pain seems to fluctuate with good/ bad days depending on activity.    Limitations Lifting;Standing;House hold activities   How long can you sit comfortably? unlimited   How long can you stand comfortably? unlimited   How long can you walk comfortably? unlimited   Diagnostic tests nothing recent   Patient Stated Goals get back to functioning and reduce pain    Currently in Pain? Yes   Pain Score 5   took pain meds this AM   Pain Location Shoulder   Pain Orientation Left   Pain Descriptors / Indicators Throbbing   Pain Type Surgical pain   Pain Radiating Towards  to the mid/promixal lateral humerus   Pain Onset 1 to 4 weeks ago   Pain Frequency Constant   Aggravating Factors  movement,   Pain Relieving Factors pain medication, resting,             [facility] PT Assessment - 07/04/16 1032      Assessment   Medical Diagnosis L RCR    Referring Provider Milly Jakob MD   Onset Date/Surgical Date 06/27/16   Hand Dominance Left   Next MD Visit --  3 weeks   Prior Therapy no     Precautions   Precautions Shoulder   Type of Shoulder Precautions no active shoulder movement,      Restrictions   Weight Bearing Restrictions Yes   LUE Weight Bearing Non weight bearing     Balance Screen   Has the patient fallen in the past 6 months No   Has the patient had a decrease in activity level because of a fear of falling?  No   Is the patient reluctant to leave their home because of a fear of falling?  No     Home Nurse, mental health Private residence   Living Arrangements Spouse/significant other;Children   Available Help at Discharge Available PRN/intermittently   Type of Home House   Home Access Stairs to enter   Entrance  Stairs-Number of Steps 3   Entrance Stairs-Rails Right   Home Layout One level   Home Equipment Other (comment)  sling     Prior Function   Level of Independence Independent;Independent with basic ADLs   Vocation Self employed  Engineer, manufacturinglectrician   Vocation Requirements lifting, carryinfing, crawling, above head working, reaching,    Leisure fishing, hanging out with family     Cognition   Overall Cognitive Status Within Functional Limits for tasks assessed     Observation/Other Assessments   Observations incision appears clean, intact and healing well with steri strips   Focus on Therapeutic Outcomes (FOTO)  87% limited  predicted 40% limited     Posture/Postural Control   Posture/Postural Control Postural limitations   Postural Limitations Rounded Shoulders;Forward head     ROM / Strength   AROM / PROM  / Strength AROM;PROM;Strength     AROM   Overall AROM Comments didn't assess L shoulder AROM due to precautions   AROM Assessment Site Shoulder   Right/Left Shoulder Right;Left   Right Shoulder Extension 62 Degrees   Right Shoulder Flexion 150 Degrees   Right Shoulder ABduction 135 Degrees   Right Shoulder Internal Rotation 69 Degrees   Right Shoulder External Rotation 56 Degrees     PROM   PROM Assessment Site Shoulder   Right/Left Shoulder Left   Left Shoulder Extension 15 Degrees   Left Shoulder Flexion 20 Degrees   Left Shoulder ABduction 15 Degrees   Left Shoulder Internal Rotation --  to stomach   Left Shoulder External Rotation --  -8 degrees from neutral     Strength   Overall Strength Comments didn't assess L shoulder strength due to precautions   Strength Assessment Site Shoulder;Hand   Right/Left Shoulder Right   Right Shoulder Flexion 5/5   Right Shoulder Extension 5/5   Right Shoulder ABduction 5/5   Right Shoulder Internal Rotation 5/5   Right/Left hand Right;Left   Right Hand Grip (lbs) 103  99,105,105   Left Hand Grip (lbs) 65.6  61,69,67,     Palpation   Palpation comment tenderness located around the incsion site and along the distal lateral deltoid region                           PT Education - 07/04/16 1259    Education provided Yes   Education Details evaluation findings, POC, goals, HEP with proper form/ rationale, [facility] clinic handout   Person(s) Educated Patient   Methods Explanation;Demonstration;Verbal cues;Handout   Comprehension Verbalized understanding;Returned demonstration;Verbal cues required          PT Short Term Goals - 07/04/16 1308      PT SHORT TERM GOAL #1   Title pt will be I with inital HEP (07/25/2016)   Baseline no inital HEP   Time 3   Period Weeks   Status New     PT SHORT TERM GOAL #2   Title pt will improve PROM shoulder flexion/ abduciton to >/= 50 degrees with </=5/10 pain  to assist with  functional progression (07/25/2016)   Baseline flexion 20 degrees, abduction 15 degres   Time 3   Period Weeks   Status New     PT SHORT TERM GOAL #3   Title pt will improve L hand grip strength by >/= 10 # to indicate shoulder functional improvement (07/25/2016)   Baseline initial 65.6#   Time 3   Period Weeks   Status New  PT Long Term Goals - 07/04/16 1308      PT LONG TERM GOAL #1   Title pt will be I with all HEP as of last visit ( 08/29/2016)   Baseline no previous HEP   Time 6   Period Weeks   Status New     PT LONG TERM GOAL #2   Title pt will demontrate L shoulder flexion/ abduction >/= 100 degrees and ER >/= 45 degrees with </= 2/10 pain to assist with functional mobility (08/29/2016)   Baseline no AROM was assess due to precautions at evaluation   Time 6   Period Weeks   Status New     PT LONG TERM GOAL #3   Title pt will improve L shoulder strength to >/= 3+/5 to promote functional lifting and carrying activities required for work related tasks (08/29/2016)   Baseline unable to lift/ carrying due to precautions   Time 6   Period Weeks   Status New     PT LONG TERM GOAL #4   Title pt will increase FOTO score to >/= 50% limitation to demonstrate improvement in function (08/29/2016)   Baseline 87% limited on evaluation   Time 6   Period Weeks   Status New               Plan - 07/04/16 1300    Clinical Impression Statement Mr. Raynor presents to OPPT as a Moderate complexity evaluation s/p L RCR on 06/27/2016. He demonstrates limited PROM of the L shoulder secondary to pain and guarding, AROM / strength wasn't assessed per precautions. tenderness is noted around the incision and the lateral deltoid region. pt would benefit from physical therapy to reduce shoulder pain, improve mobility, increase strength and return pt to PLOF by addressing the defecits listed.   CPT 29827   Rehab Potential Good   PT Frequency Biweekly   PT Duration 6 weeks    PT Treatment/Interventions ADLs/Self Care Home Management;Cryotherapy;Electrical Stimulation;Iontophoresis 4mg /ml Dexamethasone;Functional mobility training;Dry needling;Therapeutic activities;Therapeutic exercise;Passive range of motion;Ultrasound;Moist Heat;Manual techniques;Patient/family education;Taping;Vasopneumatic Device   PT Next Visit Plan assess/ review HEP, PROM, shoulder/ scapular mobs, modalities PRN   PT Home Exercise Plan wand flexion/ abduction/ ER, scapular retraction, bicep curls, ball squeezes, table slides   Consulted and Agree with Plan of Care Patient      Patient will benefit from skilled therapeutic intervention in order to improve the following deficits and impairments:  Pain, Increased fascial restricitons, Increased muscle spasms, Decreased strength, Increased edema, Improper body mechanics, Postural dysfunction, Decreased endurance, Decreased activity tolerance, Decreased range of motion, Impaired flexibility  Visit Diagnosis: S/P rotator cuff repair - Plan: PT plan of care cert/re-cert  Stiffness of left shoulder, not elsewhere classified - Plan: PT plan of care cert/re-cert  Pain in left shoulder - Plan: PT plan of care cert/re-cert  Muscle weakness (generalized) - Plan: PT plan of care cert/re-cert     Problem List Patient Active Problem List   Diagnosis Date Noted  . Routine general medical examination at a health care facility 01/20/2014  . 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 HYPERLIPIDEMIA 03/17/2009  . DM 12/08/2008  . OBSTRUCTIVE SLEEP APNEA 12/08/2008  . Essential hypertension 12/08/2008   Lulu Riding PT, DPT, LAT, ATC  07/04/16  1:20 PM      [facility] [address] [place], [place], [address] Phone: [phone]   Fax:  [phone]  Name: Richard Mendoza  MRN: [id number] Date of Birth: 14-Dec-1975

## 2016-07-19 ENCOUNTER — Ambulatory Visit: Payer: Medicaid Other | Admitting: Physical Therapy

## 2016-07-19 DIAGNOSIS — Z9889 Other specified postprocedural states: Secondary | ICD-10-CM

## 2016-07-19 DIAGNOSIS — M6281 Muscle weakness (generalized): Secondary | ICD-10-CM

## 2016-07-19 DIAGNOSIS — M25612 Stiffness of left shoulder, not elsewhere classified: Secondary | ICD-10-CM

## 2016-07-19 DIAGNOSIS — M25512 Pain in left shoulder: Secondary | ICD-10-CM

## 2016-07-19 NOTE — Therapy (Signed)
[facility] [address] [place], [place], [address] Phone: [phone]   Fax:  [phone]  Physical Therapy Treatment  Patient Details  Name: Richard Mendoza MRN: [id number] Date of Birth: 06/10/1976 Referring Provider: Milly Jakob MD  Encounter Date: 07/19/2016      PT End of Session - 07/19/16 0935    Visit Number 2   Number of Visits 4   Date for PT Re-Evaluation 08/29/16   Authorization Type Medicaid   Authorization - Visit Number 2   Authorization - Number of Visits 4   PT Start Time 0930   PT Stop Time 1030   PT Time Calculation (min) 60 min      Past Medical History:  Diagnosis Date  . DIABETES MELLITUS, TYPE II, UNCONTROLLED 03/17/2009  . DM 12/08/2008  . HYPERLIPIDEMIA 03/17/2009  . HYPERTENSION 12/08/2008  . Hypertension   . OBSTRUCTIVE SLEEP APNEA 12/08/2008  . 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 APPENDECTOMY      There were no vitals filed for this visit.      Subjective Assessment - 07/19/16 0936    Subjective 4-5/10 before pain meds   Currently in Pain? Yes   Pain Score 2    Pain Location Shoulder   Pain Orientation Left   Pain Descriptors / Indicators Aching;Burning;Sharp   Pain Frequency Constant   Pain Relieving Factors pain meds                         [facility] Adult PT Treatment/Exercise - 07/19/16 0001      Shoulder Exercises: Supine   Other Supine Exercises PROM using opposite UE to raise arm into flexion x 10      Shoulder Exercises: Seated   Retraction 10 reps   Other Seated Exercises PROM using opposite extremity to raise into flexion x3    Other Seated Exercises AROM bicep curls x 10     Shoulder Exercises: ROM/Strengthening   Other ROM/Strengthening Exercises table slides 3 x 10 sec, cues for posture     Modalities   Modalities Cryotherapy     Manual Therapy   Manual Therapy Joint mobilization;Passive ROM    Joint Mobilization gentle grade 2 A/P and inferior glides, sidelying scap mobs - popping present   Passive ROM flexion to 90 abduction to 90, Er to 15                  PT Short Term Goals - 07/04/16 1308      PT SHORT TERM GOAL #1   Title pt will be I with inital HEP (07/25/2016)   Baseline no inital HEP   Time 3   Period Weeks   Status New     PT SHORT TERM GOAL #2   Title pt will improve PROM shoulder flexion/ abduciton to >/= 50 degrees with </=5/10 pain  to assist with functional progression (07/25/2016)   Baseline flexion 20 degrees, abduction 15 degres   Time 3   Period Weeks   Status New     PT SHORT TERM GOAL #3   Title pt will improve L hand grip strength by >/= 10 # to indicate shoulder functional improvement (07/25/2016)   Baseline initial 65.6#   Time 3   Period Weeks   Status New           PT Long Term Goals - 07/04/16 1308  PT LONG TERM GOAL #1   Title pt will be I with all HEP as of last visit ( 08/29/2016)   Baseline no previous HEP   Time 6   Period Weeks   Status New     PT LONG TERM GOAL #2   Title pt will demontrate L shoulder flexion/ abduction >/= 100 degrees and ER >/= 45 degrees with </= 2/10 pain to assist with functional mobility (08/29/2016)   Baseline no AROM was assess due to precautions at evaluation   Time 6   Period Weeks   Status New     PT LONG TERM GOAL #3   Title pt will improve L shoulder strength to >/= 3+/5 to promote functional lifting and carrying activities required for work related tasks (08/29/2016)   Baseline unable to lift/ carrying due to precautions   Time 6   Period Weeks   Status New     PT LONG TERM GOAL #4   Title pt will increase FOTO score to >/= 50% limitation to demonstrate improvement in function (08/29/2016)   Baseline 87% limited on evaluation   Time 6   Period Weeks   Status New               Plan - 07/19/16 1315    Clinical Impression Statement Pt reports something does not  feel right in his shoulder. He is complaint with his precautions-no active use of LUE. He is performing self passive ROM at home. PROM to 90 flexion with significant increase in pain. ER to 15 degrees and he reports not much pain with this motion, just tightness. he requires cues to perform his table slides and seated passive ROM with good posture. He has not been icing. Recommended he ice 3 x per day and stretch. Discussed options for after covered visits are finished. He is interested in our group program or [employer] [facility] clinic.    PT Next Visit Plan assess/ review HEP, PROM ONLY, shoulder/ scapular mobs, modalities PRN      Patient will benefit from skilled therapeutic intervention in order to improve the following deficits and impairments:  Pain, Increased fascial restricitons, Increased muscle spasms, Decreased strength, Increased edema, Improper body mechanics, Postural dysfunction, Decreased endurance, Decreased activity tolerance, Decreased range of motion, Impaired flexibility  Visit Diagnosis: S/P rotator cuff repair  Stiffness of left shoulder, not elsewhere classified  Pain in left shoulder  Muscle weakness (generalized)     Problem List Patient Active Problem List   Diagnosis Date Noted  . Routine general medical examination at a health care facility 01/20/2014  . 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 HYPERLIPIDEMIA 03/17/2009  . DM 12/08/2008  . OBSTRUCTIVE SLEEP APNEA 12/08/2008  . Essential hypertension 12/08/2008    Sherrie Mustacheonoho, Jessica McGee, PTA 07/19/2016, 1:23 PM  [facility] [address] [place], [place], [address] Phone: [phone]   Fax:  [phone]  Name: Richard Mendoza MRN: [id number] Date of Birth: 1976-05-21

## 2016-07-30 DIAGNOSIS — Z720 Tobacco use: Secondary | ICD-10-CM | POA: Insufficient documentation

## 2016-07-30 DIAGNOSIS — L03115 Cellulitis of right lower limb: Secondary | ICD-10-CM | POA: Insufficient documentation

## 2016-07-31 ENCOUNTER — Ambulatory Visit: Payer: Medicaid Other | Admitting: Physical Therapy

## 2016-08-14 ENCOUNTER — Ambulatory Visit: Payer: Medicaid Other | Attending: Orthopedic Surgery | Admitting: Physical Therapy

## 2016-08-14 DIAGNOSIS — M6281 Muscle weakness (generalized): Secondary | ICD-10-CM | POA: Diagnosis present

## 2016-08-14 DIAGNOSIS — M25612 Stiffness of left shoulder, not elsewhere classified: Secondary | ICD-10-CM

## 2016-08-14 DIAGNOSIS — M25512 Pain in left shoulder: Secondary | ICD-10-CM

## 2016-08-14 DIAGNOSIS — Z9889 Other specified postprocedural states: Secondary | ICD-10-CM | POA: Insufficient documentation

## 2016-08-14 NOTE — Therapy (Addendum)
[place], [place], [address] Phone: [phone]   Fax:  [phone]  Physical Therapy Treatment / Discharge note  Patient Details  Name: Richard Mendoza MRN: [id number] Date of Birth: Mar 23, 1976 Referring Provider: Lowella Petties MD  Encounter Date: 08/14/2016      PT End of Session - 08/14/16 1115    Visit Number 3   Number of Visits 4   Date for PT Re-Evaluation 08/29/16   Authorization - Visit Number 3   Authorization - Number of Visits 4   PT Start Time 1019   PT Stop Time 1100   PT Time Calculation (min) 41 min   Activity Tolerance Patient tolerated treatment well   Behavior During Therapy [facility] for tasks assessed/performed      Past Medical History:  Diagnosis Date  . DIABETES MELLITUS, TYPE II, UNCONTROLLED 03/17/2009  . DM 12/08/2008  . HYPERLIPIDEMIA 03/17/2009  . HYPERTENSION 12/08/2008  . Hypertension   . OBSTRUCTIVE SLEEP APNEA 12/08/2008  . 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 APPENDECTOMY      There were no vitals filed for this visit.      Subjective Assessment - 08/14/16 1018    Subjective (P)  anterior, superior shoulder pain stabbing. intermittant.  Burning has gone away.   Currently in Pain? (P)  Yes   Pain Score (P)  2   gets up to 7-8/10   Pain Location (P)  Shoulder   Pain Orientation (P)  Left;Anterior  superior   Pain Descriptors / Indicators (P)  Aching;Sharp   Pain Relieving Factors (P)  ice, meds            [facility] PT Assessment - 08/14/16 0001      PROM   PROM Assessment Site Shoulder   Right/Left Shoulder Left   Left Shoulder Flexion 125 Degrees   Left Shoulder ABduction 85 Degrees                     [facility] Adult PT Treatment/Exercise - 08/14/16 0001      Shoulder Exercises: Seated   Other Seated Exercises shoulder circles, elevation, depression, retraction intermittantly 5 -10 x each,   upper trap stretch     Manual Therapy   Manual Therapy Soft tissue mobilization;Scapular mobilization;Passive ROM;Taping  PROM much improved   Joint Mobilization gentle distraction with myofascial stretch anterior shoulder   Soft tissue mobilization Left shoulder quadrant,  teres sensitive   Passive ROM Left shoulder.  after soft tissue work .improved.                   PT Short Term Goals - 08/14/16 1119      PT SHORT TERM GOAL #1   Title pt will be I with inital HEP (07/25/2016)   Baseline able to recite HEP   Time 3   Period Weeks   Status On-going     PT SHORT TERM GOAL #2   Baseline Flexion 125, Abduction 85, PROM   Time 3   Period Weeks   Status Achieved     PT SHORT TERM GOAL #3   Title pt will improve L hand grip strength by >/= 10 # to indicate shoulder functional improvement (07/25/2016)   Time 3   Period Weeks   Status Unable to assess           PT Long Term Goals - 07/04/16 1308  PT LONG TERM GOAL #1   Title pt will be I with all HEP as of last visit ( 08/29/2016)   Baseline no previous HEP   Time 6   Period Weeks   Status New     PT LONG TERM GOAL #2   Title pt will demontrate L shoulder flexion/ abduction >/= 100 degrees and ER >/= 45 degrees with </= 2/10 pain to assist with functional mobility (08/29/2016)   Baseline no AROM was assess due to precautions at evaluation   Time 6   Period Weeks   Status New     PT LONG TERM GOAL #3   Title pt will improve L shoulder strength to >/= 3+/5 to promote functional lifting and carrying activities required for work related tasks (08/29/2016)   Baseline unable to lift/ carrying due to precautions   Time 6   Period Weeks   Status New     PT LONG TERM GOAL #4   Title pt will increase FOTO score to >/= 50% limitation to demonstrate improvement in function (08/29/2016)   Baseline 87% limited on evaluation   Time 6   Period Weeks   Status New               Plan - 08/14/16 1117     Clinical Impression Statement Pain 4/10 post session.  He declined the ned for modalities.  125 degrees PROM flexion, 85 degrees PROM Abduction.  He has been adherent with his home exercises.   Precautions continue.    PT Next Visit Plan assess/ review HEP, PROM ONLY, shoulder/ scapular mobs, modalities PRN.  Check protocol to see if he can advance.    PT Home Exercise Plan continue   Consulted and Agree with Plan of Care Patient      Patient will benefit from skilled therapeutic intervention in order to improve the following deficits and impairments:  Pain, Increased fascial restricitons, Increased muscle spasms, Decreased strength, Increased edema, Improper body mechanics, Postural dysfunction, Decreased endurance, Decreased activity tolerance, Decreased range of motion, Impaired flexibility  Visit Diagnosis: S/P rotator cuff repair  Stiffness of left shoulder, not elsewhere classified  Pain in left shoulder  Muscle weakness (generalized)     Problem List Patient Active Problem List   Diagnosis Date Noted  . Routine general medical examination at a health care facility 01/20/2014  . 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 HYPERLIPIDEMIA 03/17/2009  . DM 12/08/2008  . OBSTRUCTIVE SLEEP APNEA 12/08/2008  . Essential hypertension 12/08/2008    Phillippa Straub PTA 08/14/2016, 11:22 AM  [facility] [address] [place], [place], [address] Phone: [phone]   Fax:  [phone]  Name: Richard Mendoza MRN: [id number] Date of Birth: 1976/01/20      PHYSICAL THERAPY DISCHARGE SUMMARY  Visits from Start of Care: 3  Current functional level related to goals / functional outcomes: See goals   Remaining deficits: unknown   Education / Equipment: HEP  Plan: Patient agrees to discharge.  Patient goals were not met. Patient is being discharged due to not returning  since the last visit.  ?????     Kristoffer Leamon PT, DPT, LAT, ATC  10/18/16  12:18 PM

## 2017-05-16 ENCOUNTER — Encounter (HOSPITAL_COMMUNITY): Payer: Self-pay | Admitting: Emergency Medicine

## 2017-05-16 DIAGNOSIS — M549 Dorsalgia, unspecified: Secondary | ICD-10-CM | POA: Insufficient documentation

## 2017-05-16 DIAGNOSIS — Z5321 Procedure and treatment not carried out due to patient leaving prior to being seen by health care provider: Secondary | ICD-10-CM | POA: Insufficient documentation

## 2017-05-16 NOTE — ED Triage Notes (Signed)
Pt states he fell off of the deck on Monday without hitting any steps, pt states he did hit his head on the ground, denies LOC. Pt now c/o neck pain, lower limb swelling with pain to the left ankle.  NAD, pt ambulatory at triage. Pt c/o 1 episode of vomiting since the fall.

## 2017-05-17 ENCOUNTER — Emergency Department (HOSPITAL_COMMUNITY)
Admission: EM | Admit: 2017-05-17 | Discharge: 2017-05-17 | Disposition: A | Discharge status: Home / Self Care | Payer: Medicaid Other | Attending: Emergency Medicine | Admitting: Emergency Medicine

## 2017-05-28 ENCOUNTER — Ambulatory Visit: Payer: Medicaid Other | Admitting: Family Medicine

## 2017-07-31 DIAGNOSIS — K219 Gastro-esophageal reflux disease without esophagitis: Secondary | ICD-10-CM | POA: Insufficient documentation

## 2017-07-31 DIAGNOSIS — N529 Male erectile dysfunction, unspecified: Secondary | ICD-10-CM | POA: Insufficient documentation

## 2017-07-31 DIAGNOSIS — M75102 Unspecified rotator cuff tear or rupture of left shoulder, not specified as traumatic: Secondary | ICD-10-CM | POA: Insufficient documentation

## 2017-08-08 ENCOUNTER — Encounter: Payer: Self-pay | Admitting: Family Medicine

## 2017-08-19 ENCOUNTER — Emergency Department
Admission: EM | Admit: 2017-08-19 | Discharge: 2017-08-19 | Disposition: A | Discharge status: Home / Self Care | Payer: Medicaid Other | Attending: Emergency Medicine | Admitting: Emergency Medicine

## 2017-08-19 ENCOUNTER — Emergency Department: Payer: Medicaid Other

## 2017-08-19 ENCOUNTER — Encounter: Payer: Self-pay | Admitting: Emergency Medicine

## 2017-08-19 DIAGNOSIS — R52 Pain, unspecified: Secondary | ICD-10-CM

## 2017-08-19 DIAGNOSIS — I10 Essential (primary) hypertension: Secondary | ICD-10-CM | POA: Insufficient documentation

## 2017-08-19 DIAGNOSIS — R911 Solitary pulmonary nodule: Secondary | ICD-10-CM | POA: Insufficient documentation

## 2017-08-19 DIAGNOSIS — R110 Nausea: Secondary | ICD-10-CM | POA: Diagnosis not present

## 2017-08-19 DIAGNOSIS — Z794 Long term (current) use of insulin: Secondary | ICD-10-CM | POA: Diagnosis not present

## 2017-08-19 DIAGNOSIS — F17210 Nicotine dependence, cigarettes, uncomplicated: Secondary | ICD-10-CM | POA: Insufficient documentation

## 2017-08-19 DIAGNOSIS — K760 Fatty (change of) liver, not elsewhere classified: Secondary | ICD-10-CM | POA: Diagnosis not present

## 2017-08-19 DIAGNOSIS — E109 Type 1 diabetes mellitus without complications: Secondary | ICD-10-CM | POA: Diagnosis not present

## 2017-08-19 DIAGNOSIS — R1013 Epigastric pain: Secondary | ICD-10-CM | POA: Insufficient documentation

## 2017-08-19 LAB — COMPREHENSIVE METABOLIC PANEL
ALK PHOS: 125 U/L (ref 38–126)
ALT: 15 U/L — AB (ref 17–63)
AST: 19 U/L (ref 15–41)
Albumin: 4.3 g/dL (ref 3.5–5.0)
Anion gap: 11 (ref 5–15)
BILIRUBIN TOTAL: 1.2 mg/dL (ref 0.3–1.2)
BUN: 10 mg/dL (ref 6–20)
CALCIUM: 9.7 mg/dL (ref 8.9–10.3)
CO2: 25 mmol/L (ref 22–32)
CREATININE: 0.76 mg/dL (ref 0.61–1.24)
Chloride: 99 mmol/L — ABNORMAL LOW (ref 101–111)
GFR calc Af Amer: >60 mL/min (ref >60)
Glucose, Bld: 387 mg/dL — ABNORMAL HIGH (ref 65–99)
Potassium: 3.8 mmol/L (ref 3.5–5.1)
Sodium: 135 mmol/L (ref 135–145)
Total Protein: 7.7 g/dL (ref 6.5–8.1)

## 2017-08-19 LAB — URINALYSIS, COMPLETE (UACMP) WITH MICROSCOPIC
Bacteria, UA: NONE SEEN
Bilirubin Urine: NEGATIVE
KETONES UR: NEGATIVE mg/dL
LEUKOCYTES UA: NEGATIVE
Nitrite: NEGATIVE
Protein, ur: NEGATIVE mg/dL
Specific Gravity, Urine: 1.027 (ref 1.005–1.030)
WBC, UA: NONE SEEN WBC/hpf (ref 0–5)
pH: 8 (ref 5.0–8.0)

## 2017-08-19 LAB — CBC
HCT: 42 % (ref 40.0–52.0)
Hemoglobin: 14.6 g/dL (ref 13.0–18.0)
MCH: 29.7 pg (ref 26.0–34.0)
MCHC: 34.8 g/dL (ref 32.0–36.0)
MCV: 85.3 fL (ref 80.0–100.0)
PLATELETS: 201 10*3/uL (ref 150–440)
RBC: 4.92 MIL/uL (ref 4.40–5.90)
RDW: 13.2 % (ref 11.5–14.5)
WBC: 10.6 10*3/uL (ref 3.8–10.6)

## 2017-08-19 LAB — LIPASE, BLOOD: Lipase: 26 U/L (ref 11–51)

## 2017-08-19 MED ORDER — ONDANSETRON HCL 4 MG/2ML IJ SOLN
4.0000 mg | Freq: Once | INTRAMUSCULAR | Status: AC
  Administered 2017-08-19: 4 mg via INTRAVENOUS
  Filled 2017-08-19: qty 2

## 2017-08-19 MED ORDER — MORPHINE SULFATE (PF) 4 MG/ML IV SOLN
6.0000 mg | Freq: Once | INTRAVENOUS | Status: AC
  Administered 2017-08-19: 6 mg via INTRAVENOUS
  Filled 2017-08-19: qty 2

## 2017-08-19 MED ORDER — FAMOTIDINE 20 MG PO TABS
40.0000 mg | ORAL_TABLET | Freq: Once | ORAL | Status: AC
  Administered 2017-08-19: 40 mg via ORAL
  Filled 2017-08-19: qty 2

## 2017-08-19 MED ORDER — SODIUM CHLORIDE 0.9 % IV BOLUS (SEPSIS)
1000.0000 mL | Freq: Once | INTRAVENOUS | Status: AC
  Administered 2017-08-19: 1000 mL via INTRAVENOUS

## 2017-08-19 MED ORDER — GI COCKTAIL ~~LOC~~
30.0000 mL | Freq: Once | ORAL | Status: AC
  Administered 2017-08-19: 30 mL via ORAL
  Filled 2017-08-19: qty 30

## 2017-08-19 MED ORDER — IOPAMIDOL (ISOVUE-300) INJECTION 61%
100.0000 mL | Freq: Once | INTRAVENOUS | Status: AC | PRN
  Administered 2017-08-19: 100 mL via INTRAVENOUS
  Filled 2017-08-19: qty 100

## 2017-08-19 NOTE — ED Notes (Signed)
He is currently in US  

## 2017-08-19 NOTE — ED Notes (Signed)
ED Provider at bedside. 

## 2017-08-19 NOTE — ED Triage Notes (Signed)
Pt presents with epigastric pain and nausea for a few days. Pt currently taking percocet for a  Shoulder injury. Last BM was on Saturday.

## 2017-08-19 NOTE — Discharge Instructions (Signed)
Please use your Zofran as needed for nausea and make an appointment to follow up with the gastroenterologist for reevaluation. Return to the emergency department for any new or worsening symptoms such as fevers, chills, if you cannot eat or drink, if your pain worsens, or for any other concerns whatsoever.  It was a pleasure to take care of you today, and thank you for coming to our emergency department.  If you have any questions or concerns before leaving please ask the nurse to grab me and I'm more than happy to go through your aftercare instructions again.  If you were prescribed any opioid pain medication today such as Norco, Vicodin, Percocet, morphine, hydrocodone, or oxycodone please make sure you do not drive when you are taking this medication as it can alter your ability to drive safely.  If you have any concerns once you are home that you are not improving or are in fact getting worse before you can make it to your follow-up appointment, please do not hesitate to call 911 and come back for further evaluation.  Merrily Brittle, MD  Results for orders placed or performed during the hospital encounter of 08/19/17  Lipase, blood  Result Value Ref Range   Lipase 26 11 - 51 U/L  Comprehensive metabolic panel  Result Value Ref Range   Sodium 135 135 - 145 mmol/L   Potassium 3.8 3.5 - 5.1 mmol/L   Chloride 99 (L) 101 - 111 mmol/L   CO2 25 22 - 32 mmol/L   Glucose, Bld 387 (H) 65 - 99 mg/dL   BUN 10 6 - 20 mg/dL   Creatinine, Ser 1.61 0.61 - 1.24 mg/dL   Calcium 9.7 8.9 - 09.6 mg/dL   Total Protein 7.7 6.5 - 8.1 g/dL   Albumin 4.3 3.5 - 5.0 g/dL   AST 19 15 - 41 U/L   ALT 15 (L) 17 - 63 U/L   Alkaline Phosphatase 125 38 - 126 U/L   Total Bilirubin 1.2 0.3 - 1.2 mg/dL   GFR calc non Af Amer >60 >60 mL/min   GFR calc Af Amer >60 >60 mL/min   Anion gap 11 5 - 15  CBC  Result Value Ref Range   WBC 10.6 3.8 - 10.6 K/uL   RBC 4.92 4.40 - 5.90 MIL/uL   Hemoglobin 14.6 13.0 - 18.0 g/dL    HCT 04.5 40.9 - 81.1 %   MCV 85.3 80.0 - 100.0 fL   MCH 29.7 26.0 - 34.0 pg   MCHC 34.8 32.0 - 36.0 g/dL   RDW 91.4 78.2 - 95.6 %   Platelets 201 150 - 440 K/uL   Ct Abdomen Pelvis W Contrast  Result Date: 08/19/2017 CLINICAL DATA:  41 year old diabetic hypertensive male with epigastric pain and nausea with vomiting for the past few days. On Percocet for shoulder injury. Prior appendectomy. Subsequent encounter. EXAM: CT ABDOMEN AND PELVIS WITH CONTRAST TECHNIQUE: Multidetector CT imaging of the abdomen and pelvis was performed using the standard protocol following bolus administration of intravenous contrast. CONTRAST:  ISOVUE-300 IOPAMIDOL (ISOVUE-300) INJECTION 61% COMPARISON:  08/19/2017 ultrasound. FINDINGS: Lower chest: Tiny pleural-based densities largest left lung base measures 5.9 x 5.2 mm (series 4, image 5). Heart size within normal limits. Hepatobiliary: Mild fatty infiltration of liver without focal hepatic lesion. No calcified gallstone. Pancreas: No pancreatic mass or pancreatic duct dilation. Spleen: No splenic mass or enlargement. Adrenals/Urinary Tract: No adrenal or renal lesion. No hydronephrosis or obstructing stone. Noncontrast filled views of the  urinary bladder unremarkable. Stomach/Bowel: Under distended stomach, portions of small bowel and colon. This limits evaluation for detection of bowel wall thickening. No extraluminal bowel inflammatory process, free fluid or free air. Vascular/Lymphatic: No aortic aneurysm or large vessel occlusion. Scattered small upper abdominal lymph nodes without adenopathy. Reproductive: No worrisome abnormality noted. Other: No free intraperitoneal air, drainable fluid collection or bowel containing hernia. Bowel extends towards the superior margin of the right inguinal canal. Musculoskeletal: Scattered minimal Schmorl's node deformities. L5-S1 right paracentral calcified disc protrusion. T8-9 calcified gas versus calcified posterior ligament  with spinal stenosis and flattening of the cord. Right femoral neck 9 mm sclerotic focus, left femoral neck 4 mm sclerotic focus and bilateral sacral 5 mm sclerotic focus may represent bone islands assuming no history of prostate cancer. IMPRESSION: Under distended stomach, portions of small bowel and colon. This limits evaluation for detection of bowel wall thickening. No extraluminal bowel inflammatory process, free fluid or free air noted. Mild fatty infiltration of the liver. L5-S1 right paracentral calcified disc protrusion. T8-9 calcified disc versus calcified posterior ligament with spinal stenosis and mild cord flattening. Right femoral neck 9 mm sclerotic focus, left femoral neck 4 mm sclerotic focus and bilateral sacral 5 mm sclerotic focus may represent bone islands assuming no history of prostate cancer. **An incidental finding of potential clinical significance has been found. Tiny pleural-based densities largest left lung base measures 5.9 x 5.2 mm (series 4, image 5). No follow-up needed if patient is low-risk (and has no known or suspected primary neoplasm). Non-contrast chest CT can be considered in 12 months if patient is high-risk. This recommendation follows the consensus statement: Guidelines for Management of Incidental Pulmonary Nodules Detected on CT Images: From the Fleischner Society 2017; Radiology 2017; 284:228-243.** Electronically Signed   By: Lacy Duverney M.D.   On: 08/19/2017 17:41   US Abdomen Limited Ruq  Result Date: 08/19/2017 CLINICAL DATA:  Epigastric pain and nausea EXAM: ULTRASOUND ABDOMEN LIMITED RIGHT UPPER QUADRANT COMPARISON:  None. FINDINGS: Gallbladder: No gallstones or wall thickening visualized. There is no pericholecystic fluid. No sonographic Murphy sign noted by sonographer. Common bile duct: Diameter: 2 mm. There is no intrahepatic or extrahepatic biliary duct dilatation. Liver: No focal lesion identified. Liver echogenicity is overall increased. There is a  hypoechoic area near the gallbladder fossa measuring 2.5 x 1.2 x 2.1 cm which may represent localized fatty sparing. Portal vein is patent on color Doppler imaging with normal direction of blood flow towards the liver. IMPRESSION: Increased liver echogenicity, felt to be indicative of a degree of hepatic steatosis. Area of probable fatty sparing near the gallbladder fossa. No focal liver lesion beyond apparent fatty sparing near the gallbladder fossa noted. It should be noted that the sensitivity of ultrasound for detection of focal liver lesions is diminished in this circumstance. Study otherwise unremarkable. Electronically Signed   By: Bretta Bang III M.D.   On: 08/19/2017 15:19

## 2017-08-19 NOTE — ED Provider Notes (Signed)
[facility] Emergency Department Provider Note  ____________________________________________   First MD Initiated Contact with Patient 08/19/17 1413     (approximate)  I have reviewed the triage vital signs and the nursing notes.   HISTORY  Chief Complaint Abdominal Pain and Emesis    HPI Richard Mendoza is a 41 y.o. male who self presents to the emergency department with 5 days of epigastric and right upper quadrant pain nausea and vomiting. His symptoms began insidiously on ThursdayFriday he went to his primary care physician who gave him an intramuscular shot of Phenergan and discharged him home with Zofran. Friday his pain and nausea was significantly better however it recurred yesterday in this morning became severe. It is primarily nausea that is bothering him but he also has intermittent moderate severity up her abdominal cramping discomfort. She's had a decreased appetite and feels bloated. He has a remote abdominal surgical history of appendectomy. He's had no fevers or chills.   Past Medical History:  Diagnosis Date  . DIABETES MELLITUS, TYPE II, UNCONTROLLED 03/17/2009  . DM 12/08/2008  . HYPERLIPIDEMIA 03/17/2009  . HYPERTENSION 12/08/2008  . Hypertension   . OBSTRUCTIVE SLEEP APNEA 12/08/2008  . 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 Routine general medical examination at a health care facility 01/20/2014  . 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 HYPERLIPIDEMIA 03/17/2009  . DM 12/08/2008  . OBSTRUCTIVE SLEEP APNEA 12/08/2008  . Essential hypertension 12/08/2008    Past Surgical History:  Procedure Laterality Date  . APPENDECTOMY      Prior to Admission medications   Medication Sig Start Date End Date Taking? Authorizing Provider  gabapentin (NEURONTIN) 300 MG capsule Take by mouth. 10/11/15 10/10/16  [provider]   insulin glargine (LANTUS) 100 UNIT/ML injection Inject 150 Units into the skin 2 (two) times daily. Inject 150 units each morning and 150 each night with meals.    [provider]  lisinopril (PRINIVIL,ZESTRIL) 10 MG tablet Take by mouth. 10/07/15 10/06/16  [provider]  sildenafil (REVATIO) 20 MG tablet 1-5 pills as needed for ED symptoms 09/26/15   Romero Belling, MD    Allergies Patient has no known allergies.  Family History  Problem Relation Age of Onset  . Diabetes Mother   . Heart disease Father   . Diabetes Father   . Arthritis Other   . Hyperlipidemia Other   . Hypertension Other   . Cancer Other        breast    Social History Social History  Substance Use Topics  . Smoking status: Current Every Day Smoker    Packs/day: 1.00    Years: 17.00    Types: Cigarettes  . Smokeless tobacco: Never Used     Comment: currently smoking 1 ppd.    . Alcohol use No    Review of Systems Constitutional: No fever/chills Eyes: No visual changes. ENT: No sore throat. Cardiovascular: Denies chest pain. Respiratory: Denies shortness of breath. Gastrointestinal: positive for abdominal pain.  positive for nausea, no vomiting.  No diarrhea.  No constipation. Genitourinary: Negative for dysuria. Musculoskeletal: Negative for back pain. Skin: Negative for rash. Neurological: Negative for headaches, focal weakness or numbness.   ____________________________________________   PHYSICAL EXAM:  VITAL SIGNS: ED Triage Vitals  Enc Vitals Group     BP 08/19/17 1232 (!) 176/100     Pulse Rate 08/19/17 1232  87     Resp 08/19/17 1232 20     Temp 08/19/17 1232 97.8 F (36.6 C)     Temp Source 08/19/17 1232 Oral     SpO2 08/19/17 1232 98 %     Weight 08/19/17 1234 196 lb (88.9 kg)     Height --      Head Circumference --      Peak Flow --      Pain Score 08/19/17 1233 6     Pain Loc --      Pain Edu? --      Excl. in GC? --     Constitutional: alert and  oriented 4 appears somewhat uncomfortable nontoxic no diaphoresis speaks in full clear sentences Eyes: PERRL EOMI. Head: Atraumatic. Nose: No congestion/rhinnorhea. Mouth/Throat: No trismus Neck: No stridor.   Cardiovascular: Normal rate, regular rhythm. Grossly normal heart sounds.  Good peripheral circulation. Respiratory: Normal respiratory effort.  No retractions. Lungs CTAB and moving good air Gastrointestinal: soft nondistended mild upper abdominal tenderness although with no rebound or guarding no peritonitis and negative Murphy's Musculoskeletal: No lower extremity edema   Neurologic:  Normal speech and language. No gross focal neurologic deficits are appreciated. Skin:  Skin is warm, dry and intact. No rash noted. Psychiatric: Mood and affect are normal. Speech and behavior are normal.    ____________________________________________   DIFFERENTIAL includes but not limited to  biliary colic, cholecystitis, cholangitis, gastritis, esophageal spasm, gastroparesis ____________________________________________   LABS (all labs ordered are listed, but only abnormal results are displayed)  Labs Reviewed  COMPREHENSIVE METABOLIC PANEL - Abnormal; Notable for the following:       Result Value   Chloride 99 (*)    Glucose, Bld 387 (*)    ALT 15 (*)    All other components within normal limits  URINALYSIS, COMPLETE (UACMP) WITH MICROSCOPIC - Abnormal; Notable for the following:    Color, Urine STRAW (*)    APPearance CLEAR (*)    Glucose, UA >=500 (*)    Hgb urine dipstick SMALL (*)    Squamous Epithelial / LPF 0-5 (*)    All other components within normal limits  LIPASE, BLOOD  CBC    blood work reviewed and interpreted by me shows significant amount of glucose in the urine but otherwise unremarkable. No signs of diabetic ketoacidosis __________________________________________  EKG  ED ECG REPORT I, Merrily Brittle, the attending physician, personally viewed and  interpreted this ECG.  Date: 08/19/2017 EKG Time:  Rate: 85 Rhythm: normal sinus rhythm QRS Axis: normal Intervals: normal ST/T Wave abnormalities: normal Narrative Interpretation: no evidence of acute ischemia  ____________________________________________  RADIOLOGY  right upper quadrant ultrasound reviewed by me with no gallstones noted although does show an incidental hepatic steatosis CT scan of the abdomen and pelvis reviewed by me with no acute disease but multiple incidental llamas disclosed to the patient  ____________________________________________   PROCEDURES  Procedure(s) performed: no  Procedures  Critical Care performed: no  Observation: no ____________________________________________   INITIAL IMPRESSION / ASSESSMENT AND PLAN / ED COURSE  Pertinent labs & imaging results that were available during my care of the patient were reviewed by me and considered in my medical decision making (see chart for details).  The patient arrives uncomfortable appearing with nausea and upper abdominal pain. His pain is epigastric and somewhat in the right upper quadrant so in addition to blood work we will get a rapid upper quadrant ultrasound. I'll also treat him with a GI cocktail  and famotidine symptomatically now.  The pain medication did not help the patient at all in his right upper quadrant ultrasound is negative. Had a lengthy discussion with the patient and his wife regarding the diagnostic uncertainty at this point and that I felt a CT scan would be the next logical step to pursue an etiology of her symptoms. CT scan obtained which is negative for acute pathology. It does have several incidental ominous which I discussed with the patient. At this point I feel the patient's upper abdominal pain is either secondary to gastroparesis or could also be secondary to H. pylori or esophageal spasm. I will refer him back to his primary care physician as this pain is somewhat  improved after opioid pain medications. He verbalizes understanding and agreement with the plan. Strict return precautions given.    ----------------------------------------- 3:56 PM on 08/19/2017 -----------------------------------------  The patient's ultrasound is negative aside from some mild steatosis. His pain is actually worse than previous. Given unclear etiology of the significant pain and discomfort we will progress on to CT scan now. If the scan is negative we will treat him symptomatically and refer him back to gastroenterology.  ____________________________________________   FINAL CLINICAL IMPRESSION(S) / ED DIAGNOSES  Final diagnoses:  Pain  Epigastric pain  Nausea  Pulmonary nodule  Fatty liver      NEW MEDICATIONS STARTED DURING THIS VISIT:  Discharge Medication List as of 08/19/2017  5:55 PM       Note:  This document was prepared using Dragon voice recognition software and may include unintentional dictation errors.     Merrily Brittle, MD 08/19/17 2248

## 2017-08-19 NOTE — ED Notes (Signed)
Pt was offered the ordered dose of pain and nausea medication, pt currently tolerating pain and requesting NOT to receive medication at this time. Pt was informed to notify RN staff if pain returned

## 2017-08-19 NOTE — ED Notes (Signed)
Pt to CT

## 2017-08-21 ENCOUNTER — Ambulatory Visit: Payer: Medicaid Other | Admitting: Gastroenterology

## 2017-08-27 DIAGNOSIS — M5020 Other cervical disc displacement, unspecified cervical region: Secondary | ICD-10-CM | POA: Insufficient documentation

## 2017-09-06 ENCOUNTER — Other Ambulatory Visit: Payer: Self-pay | Admitting: Neurosurgery

## 2017-09-09 ENCOUNTER — Encounter (HOSPITAL_COMMUNITY): Admission: EM | Disposition: A | Discharge status: Home / Self Care | Payer: Self-pay | Source: Home / Self Care

## 2017-09-09 ENCOUNTER — Emergency Department (HOSPITAL_COMMUNITY): Payer: Medicaid Other

## 2017-09-09 ENCOUNTER — Encounter (HOSPITAL_COMMUNITY): Payer: Self-pay

## 2017-09-09 ENCOUNTER — Observation Stay: Admit: 2017-09-09 | Payer: Medicaid Other | Admitting: Neurosurgery

## 2017-09-09 ENCOUNTER — Emergency Department (HOSPITAL_COMMUNITY): Payer: Medicaid Other | Admitting: Certified Registered Nurse Anesthetist

## 2017-09-09 ENCOUNTER — Ambulatory Visit (HOSPITAL_COMMUNITY)
Admission: EM | Admit: 2017-09-09 | Discharge: 2017-09-10 | Disposition: A | Discharge status: Home / Self Care | Payer: Medicaid Other | Attending: Neurosurgery | Admitting: Neurosurgery

## 2017-09-09 ENCOUNTER — Observation Stay: Admission: AD | Admit: 2017-09-09 | Payer: Medicaid Other | Source: Ambulatory Visit | Admitting: Neurosurgery

## 2017-09-09 DIAGNOSIS — M5020 Other cervical disc displacement, unspecified cervical region: Secondary | ICD-10-CM | POA: Diagnosis present

## 2017-09-09 DIAGNOSIS — Z794 Long term (current) use of insulin: Secondary | ICD-10-CM | POA: Diagnosis not present

## 2017-09-09 DIAGNOSIS — E119 Type 2 diabetes mellitus without complications: Secondary | ICD-10-CM | POA: Diagnosis not present

## 2017-09-09 DIAGNOSIS — E785 Hyperlipidemia, unspecified: Secondary | ICD-10-CM | POA: Insufficient documentation

## 2017-09-09 DIAGNOSIS — I10 Essential (primary) hypertension: Secondary | ICD-10-CM | POA: Insufficient documentation

## 2017-09-09 DIAGNOSIS — Z79899 Other long term (current) drug therapy: Secondary | ICD-10-CM | POA: Diagnosis not present

## 2017-09-09 DIAGNOSIS — M50223 Other cervical disc displacement at C6-C7 level: Secondary | ICD-10-CM | POA: Diagnosis not present

## 2017-09-09 DIAGNOSIS — F17210 Nicotine dependence, cigarettes, uncomplicated: Secondary | ICD-10-CM | POA: Diagnosis not present

## 2017-09-09 DIAGNOSIS — Z419 Encounter for procedure for purposes other than remedying health state, unspecified: Secondary | ICD-10-CM

## 2017-09-09 DIAGNOSIS — G4733 Obstructive sleep apnea (adult) (pediatric): Secondary | ICD-10-CM | POA: Insufficient documentation

## 2017-09-09 HISTORY — PX: ANTERIOR CERVICAL DECOMP/DISCECTOMY FUSION: SHX1161

## 2017-09-09 LAB — CBC
HEMATOCRIT: 42.4 % (ref 39.0–52.0)
HEMOGLOBIN: 15.2 g/dL (ref 13.0–17.0)
MCH: 30.6 pg (ref 26.0–34.0)
MCHC: 35.8 g/dL (ref 30.0–36.0)
MCV: 85.5 fL (ref 78.0–100.0)
Platelets: 214 10*3/uL (ref 150–400)
RBC: 4.96 MIL/uL (ref 4.22–5.81)
RDW: 12.6 % (ref 11.5–15.5)
WBC: 13.6 10*3/uL — ABNORMAL HIGH (ref 4.0–10.5)

## 2017-09-09 LAB — BASIC METABOLIC PANEL
ANION GAP: 7 (ref 5–15)
BUN: 11 mg/dL (ref 6–20)
CO2: 26 mmol/L (ref 22–32)
Calcium: 9.7 mg/dL (ref 8.9–10.3)
Chloride: 107 mmol/L (ref 101–111)
Creatinine, Ser: 1.13 mg/dL (ref 0.61–1.24)
GFR calc Af Amer: >60 mL/min (ref >60)
GFR calc non Af Amer: >60 mL/min (ref >60)
GLUCOSE: 79 mg/dL (ref 65–99)
POTASSIUM: 3.6 mmol/L (ref 3.5–5.1)
Sodium: 140 mmol/L (ref 135–145)

## 2017-09-09 LAB — TYPE AND SCREEN
ABO/RH(D): O POS
Antibody Screen: NEGATIVE

## 2017-09-09 LAB — GLUCOSE, CAPILLARY
Glucose-Capillary: 74 mg/dL (ref 65–99)
Glucose-Capillary: 77 mg/dL (ref 65–99)

## 2017-09-09 LAB — ABO/RH: ABO/RH(D): O POS

## 2017-09-09 SURGERY — ANTERIOR CERVICAL DECOMPRESSION/DISCECTOMY FUSION 1 LEVEL
Anesthesia: General | Site: Neck

## 2017-09-09 MED ORDER — MIDAZOLAM HCL 5 MG/5ML IJ SOLN
INTRAMUSCULAR | Status: DC | PRN
  Administered 2017-09-09: 2 mg via INTRAVENOUS

## 2017-09-09 MED ORDER — OXYCODONE HCL 5 MG/5ML PO SOLN: 5.0000 mg | Freq: Once | ORAL | Status: DC | PRN

## 2017-09-09 MED ORDER — THROMBIN (RECOMBINANT) 5000 UNITS EX SOLR
CUTANEOUS | Status: DC | PRN
  Administered 2017-09-09 (×2): 5000 [IU] via TOPICAL

## 2017-09-09 MED ORDER — MEPERIDINE HCL 25 MG/ML IJ SOLN: 6.2500 mg | INTRAMUSCULAR | Status: DC | PRN

## 2017-09-09 MED ORDER — PROPOFOL 10 MG/ML IV BOLUS
INTRAVENOUS | Status: AC
  Filled 2017-09-09: qty 20

## 2017-09-09 MED ORDER — MIDAZOLAM HCL 2 MG/2ML IJ SOLN
INTRAMUSCULAR | Status: AC
  Filled 2017-09-09: qty 2

## 2017-09-09 MED ORDER — LACTATED RINGERS IV SOLN
INTRAVENOUS | Status: DC | PRN
  Administered 2017-09-09 (×2): via INTRAVENOUS

## 2017-09-09 MED ORDER — LIDOCAINE-EPINEPHRINE 0.5 %-1:200000 IJ SOLN
INTRAMUSCULAR | Status: DC | PRN
  Administered 2017-09-09: 4 mL via INTRADERMAL

## 2017-09-09 MED ORDER — PHENYLEPHRINE 40 MCG/ML (10ML) SYRINGE FOR IV PUSH (FOR BLOOD PRESSURE SUPPORT)
PREFILLED_SYRINGE | INTRAVENOUS | Status: AC
  Filled 2017-09-09: qty 10

## 2017-09-09 MED ORDER — FENTANYL CITRATE (PF) 250 MCG/5ML IJ SOLN
INTRAMUSCULAR | Status: AC
  Filled 2017-09-09: qty 5

## 2017-09-09 MED ORDER — INSULIN ASPART 100 UNIT/ML ~~LOC~~ SOLN: 0.0000 [IU] | Freq: Every day | SUBCUTANEOUS | Status: DC

## 2017-09-09 MED ORDER — INSULIN GLARGINE 100 UNIT/ML ~~LOC~~ SOLN
80.0000 [IU] | SUBCUTANEOUS | Status: DC
  Administered 2017-09-10: 80 [IU] via SUBCUTANEOUS
  Filled 2017-09-09 (×2): qty 0.8

## 2017-09-09 MED ORDER — PHENYLEPHRINE HCL 10 MG/ML IJ SOLN
INTRAVENOUS | Status: DC | PRN
  Administered 2017-09-09: 50 ug/min via INTRAVENOUS

## 2017-09-09 MED ORDER — PANTOPRAZOLE SODIUM 40 MG PO TBEC
40.0000 mg | DELAYED_RELEASE_TABLET | Freq: Every day | ORAL | Status: DC
  Administered 2017-09-10: 40 mg via ORAL
  Filled 2017-09-09: qty 1

## 2017-09-09 MED ORDER — POTASSIUM CHLORIDE IN NACL 20-0.9 MEQ/L-% IV SOLN: INTRAVENOUS | Status: DC

## 2017-09-09 MED ORDER — ZOLPIDEM TARTRATE 5 MG PO TABS: 5.0000 mg | ORAL_TABLET | Freq: Every evening | ORAL | Status: DC | PRN

## 2017-09-09 MED ORDER — ROCURONIUM BROMIDE 100 MG/10ML IV SOLN
INTRAVENOUS | Status: DC | PRN
  Administered 2017-09-09: 20 mg via INTRAVENOUS

## 2017-09-09 MED ORDER — EPHEDRINE 5 MG/ML INJ
INTRAVENOUS | Status: AC
  Filled 2017-09-09: qty 10

## 2017-09-09 MED ORDER — SODIUM CHLORIDE 0.9% FLUSH: 3.0000 mL | INTRAVENOUS | Status: DC | PRN

## 2017-09-09 MED ORDER — FENTANYL CITRATE (PF) 100 MCG/2ML IJ SOLN
INTRAMUSCULAR | Status: DC | PRN
  Administered 2017-09-09: 150 ug via INTRAVENOUS
  Administered 2017-09-09: 100 ug via INTRAVENOUS

## 2017-09-09 MED ORDER — INSULIN ASPART 100 UNIT/ML FLEXPEN: 14.0000 [IU] | PEN_INJECTOR | Freq: Three times a day (TID) | SUBCUTANEOUS | Status: DC

## 2017-09-09 MED ORDER — CEFAZOLIN SODIUM-DEXTROSE 2-4 GM/100ML-% IV SOLN
INTRAVENOUS | Status: AC
  Filled 2017-09-09: qty 100

## 2017-09-09 MED ORDER — LIDOCAINE 2% (20 MG/ML) 5 ML SYRINGE
INTRAMUSCULAR | Status: AC
  Filled 2017-09-09: qty 5

## 2017-09-09 MED ORDER — HYDROCODONE-ACETAMINOPHEN 7.5-325 MG PO TABS: 1.0000 | ORAL_TABLET | ORAL | Status: DC | PRN

## 2017-09-09 MED ORDER — MENTHOL 3 MG MT LOZG: 1.0000 | LOZENGE | OROMUCOSAL | Status: DC | PRN

## 2017-09-09 MED ORDER — PROPOFOL 10 MG/ML IV BOLUS
INTRAVENOUS | Status: DC | PRN
  Administered 2017-09-09: 170 mg via INTRAVENOUS

## 2017-09-09 MED ORDER — PHENOL 1.4 % MT LIQD: 1.0000 | OROMUCOSAL | Status: DC | PRN

## 2017-09-09 MED ORDER — LIDOCAINE-EPINEPHRINE 0.5 %-1:200000 IJ SOLN
INTRAMUSCULAR | Status: AC
  Filled 2017-09-09: qty 1

## 2017-09-09 MED ORDER — PHENYLEPHRINE HCL 10 MG/ML IJ SOLN
INTRAMUSCULAR | Status: DC | PRN
  Administered 2017-09-09 (×2): 40 ug via INTRAVENOUS
  Administered 2017-09-09: 80 ug via INTRAVENOUS

## 2017-09-09 MED ORDER — SUGAMMADEX SODIUM 200 MG/2ML IV SOLN
INTRAVENOUS | Status: AC
  Filled 2017-09-09: qty 2

## 2017-09-09 MED ORDER — SUGAMMADEX SODIUM 200 MG/2ML IV SOLN
INTRAVENOUS | Status: DC | PRN
  Administered 2017-09-09: 200 mg via INTRAVENOUS

## 2017-09-09 MED ORDER — LIDOCAINE HCL (CARDIAC) 20 MG/ML IV SOLN
INTRAVENOUS | Status: DC | PRN
  Administered 2017-09-09: 30 mg via INTRAVENOUS

## 2017-09-09 MED ORDER — DEXAMETHASONE SODIUM PHOSPHATE 10 MG/ML IJ SOLN
INTRAMUSCULAR | Status: AC
  Filled 2017-09-09: qty 1

## 2017-09-09 MED ORDER — CEFAZOLIN SODIUM-DEXTROSE 2-3 GM-%(50ML) IV SOLR
INTRAVENOUS | Status: DC | PRN
  Administered 2017-09-09: 2 g via INTRAVENOUS

## 2017-09-09 MED ORDER — THROMBIN (RECOMBINANT) 5000 UNITS EX SOLR
CUTANEOUS | Status: AC
  Filled 2017-09-09: qty 10000

## 2017-09-09 MED ORDER — SODIUM CHLORIDE 0.9% FLUSH: 3.0000 mL | Freq: Two times a day (BID) | INTRAVENOUS | Status: DC

## 2017-09-09 MED ORDER — GABAPENTIN 300 MG PO CAPS
300.0000 mg | ORAL_CAPSULE | Freq: Every day | ORAL | Status: DC
  Administered 2017-09-10: 300 mg via ORAL
  Filled 2017-09-09: qty 1

## 2017-09-09 MED ORDER — ACETAMINOPHEN 325 MG PO TABS: 650.0000 mg | ORAL_TABLET | ORAL | Status: DC | PRN

## 2017-09-09 MED ORDER — DEXAMETHASONE SODIUM PHOSPHATE 10 MG/ML IJ SOLN
INTRAMUSCULAR | Status: DC | PRN
  Administered 2017-09-09: 10 mg via INTRAVENOUS

## 2017-09-09 MED ORDER — MORPHINE SULFATE (PF) 4 MG/ML IV SOLN: 2.0000 mg | INTRAVENOUS | Status: DC | PRN

## 2017-09-09 MED ORDER — OXYCODONE HCL 5 MG PO TABS: 5.0000 mg | ORAL_TABLET | Freq: Once | ORAL | Status: DC | PRN

## 2017-09-09 MED ORDER — ONDANSETRON HCL 4 MG/2ML IJ SOLN
INTRAMUSCULAR | Status: AC
  Filled 2017-09-09: qty 2

## 2017-09-09 MED ORDER — ATORVASTATIN CALCIUM 20 MG PO TABS: 20.0000 mg | ORAL_TABLET | Freq: Every day | ORAL | Status: DC

## 2017-09-09 MED ORDER — BUPROPION HCL ER (XL) 150 MG PO TB24
150.0000 mg | ORAL_TABLET | Freq: Every day | ORAL | Status: DC
  Administered 2017-09-10: 150 mg via ORAL
  Filled 2017-09-09: qty 1

## 2017-09-09 MED ORDER — ONDANSETRON HCL 4 MG/2ML IJ SOLN
INTRAMUSCULAR | Status: DC | PRN
  Administered 2017-09-09: 4 mg via INTRAVENOUS

## 2017-09-09 MED ORDER — SILDENAFIL CITRATE 20 MG PO TABS: 20.0000 mg | ORAL_TABLET | Freq: Every day | ORAL | Status: DC | PRN

## 2017-09-09 MED ORDER — OXYCODONE HCL ER 10 MG PO T12A
10.0000 mg | EXTENDED_RELEASE_TABLET | Freq: Two times a day (BID) | ORAL | Status: DC
  Administered 2017-09-10: 10 mg via ORAL
  Filled 2017-09-09 (×2): qty 1

## 2017-09-09 MED ORDER — DIAZEPAM 5 MG PO TABS
5.0000 mg | ORAL_TABLET | Freq: Four times a day (QID) | ORAL | Status: DC | PRN
  Administered 2017-09-09 – 2017-09-10 (×3): 5 mg via ORAL
  Filled 2017-09-09 (×3): qty 1

## 2017-09-09 MED ORDER — ACETAMINOPHEN 650 MG RE SUPP: 650.0000 mg | RECTAL | Status: DC | PRN

## 2017-09-09 MED ORDER — HYDROMORPHONE HCL 1 MG/ML IJ SOLN: 0.2500 mg | INTRAMUSCULAR | Status: DC | PRN

## 2017-09-09 MED ORDER — SODIUM CHLORIDE 0.9 % IV SOLN: 250.0000 mL | INTRAVENOUS | Status: DC

## 2017-09-09 MED ORDER — SUCCINYLCHOLINE CHLORIDE 200 MG/10ML IV SOSY
PREFILLED_SYRINGE | INTRAVENOUS | Status: AC
  Filled 2017-09-09: qty 10

## 2017-09-09 MED ORDER — LISINOPRIL 5 MG PO TABS
5.0000 mg | ORAL_TABLET | Freq: Every day | ORAL | Status: DC
  Administered 2017-09-10: 5 mg via ORAL
  Filled 2017-09-09: qty 1

## 2017-09-09 MED ORDER — CELECOXIB 200 MG PO CAPS
200.0000 mg | ORAL_CAPSULE | Freq: Two times a day (BID) | ORAL | Status: DC
  Administered 2017-09-09 – 2017-09-10 (×2): 200 mg via ORAL
  Filled 2017-09-09 (×2): qty 1

## 2017-09-09 MED ORDER — HEMOSTATIC AGENTS (NO CHARGE) OPTIME
TOPICAL | Status: DC | PRN
  Administered 2017-09-09: 1 via TOPICAL

## 2017-09-09 MED ORDER — OXYCODONE ER 9 MG PO C12A: 9.0000 mg | EXTENDED_RELEASE_CAPSULE | Freq: Two times a day (BID) | ORAL | Status: DC

## 2017-09-09 MED ORDER — ROCURONIUM BROMIDE 10 MG/ML (PF) SYRINGE
PREFILLED_SYRINGE | INTRAVENOUS | Status: AC
  Filled 2017-09-09: qty 5

## 2017-09-09 MED ORDER — INSULIN ASPART 100 UNIT/ML ~~LOC~~ SOLN
0.0000 [IU] | Freq: Three times a day (TID) | SUBCUTANEOUS | Status: DC
  Administered 2017-09-10: 20 [IU] via SUBCUTANEOUS
  Administered 2017-09-10: 15 [IU] via SUBCUTANEOUS

## 2017-09-09 MED ORDER — LACTATED RINGERS IV SOLN
INTRAVENOUS | Status: DC
  Administered 2017-09-09: 18:00:00 via INTRAVENOUS

## 2017-09-09 MED ORDER — PROMETHAZINE HCL 25 MG/ML IJ SOLN: 6.2500 mg | INTRAMUSCULAR | Status: DC | PRN

## 2017-09-09 MED ORDER — OXYCODONE HCL 5 MG PO TABS
10.0000 mg | ORAL_TABLET | ORAL | Status: DC | PRN
  Administered 2017-09-09 – 2017-09-10 (×5): 10 mg via ORAL
  Filled 2017-09-09 (×5): qty 2

## 2017-09-09 MED ORDER — 0.9 % SODIUM CHLORIDE (POUR BTL) OPTIME
TOPICAL | Status: DC | PRN
  Administered 2017-09-09: 1000 mL

## 2017-09-09 SURGICAL SUPPLY — 58 items
ADH SKN CLS APL DERMABOND .7 (GAUZE/BANDAGES/DRESSINGS) ×1
BLADE CLIPPER SURG (BLADE) IMPLANT
BNDG GAUZE ELAST 4 BULKY (GAUZE/BANDAGES/DRESSINGS) IMPLANT
BUR DRUM 4.0 (BURR) ×1 IMPLANT
BUR MATCHSTICK NEURO 3.0 LAGG (BURR) ×2 IMPLANT
CANISTER SUCT 3000ML PPV (MISCELLANEOUS) ×2 IMPLANT
CARTRIDGE OIL MAESTRO DRILL (MISCELLANEOUS) ×1 IMPLANT
COVER BACK TABLE 60X90IN (DRAPES) IMPLANT
COVER BACK TABLE 80X110 HD (DRAPES) ×1 IMPLANT
DECANTER SPIKE VIAL GLASS SM (MISCELLANEOUS) ×2 IMPLANT
DERMABOND ADVANCED (GAUZE/BANDAGES/DRESSINGS) ×1
DERMABOND ADVANCED .7 DNX12 (GAUZE/BANDAGES/DRESSINGS) ×1 IMPLANT
DIFFUSER DRILL AIR PNEUMATIC (MISCELLANEOUS) ×2 IMPLANT
DRAPE HALF SHEET 40X57 (DRAPES) ×1 IMPLANT
DRAPE LAPAROTOMY 100X72 PEDS (DRAPES) ×2 IMPLANT
DRAPE MICROSCOPE LEICA (MISCELLANEOUS) ×2 IMPLANT
DRAPE POUCH INSTRU U-SHP 10X18 (DRAPES) ×2 IMPLANT
DURAPREP 6ML APPLICATOR 50/CS (WOUND CARE) ×2 IMPLANT
ELECT COATED BLADE 2.86 ST (ELECTRODE) ×2 IMPLANT
ELECT REM PT RETURN 9FT ADLT (ELECTROSURGICAL) ×2
ELECTRODE REM PT RTRN 9FT ADLT (ELECTROSURGICAL) ×1 IMPLANT
GAUZE SPONGE 4X4 16PLY XRAY LF (GAUZE/BANDAGES/DRESSINGS) IMPLANT
GLOVE BIO SURGEON STRL SZ 6.5 (GLOVE) ×1 IMPLANT
GLOVE BIO SURGEON STRL SZ7 (GLOVE) ×2 IMPLANT
GLOVE ECLIPSE 6.5 STRL STRAW (GLOVE) ×2 IMPLANT
GLOVE ECLIPSE 8.5 STRL (GLOVE) ×1 IMPLANT
GLOVE EXAM NITRILE LRG STRL (GLOVE) IMPLANT
GLOVE EXAM NITRILE XL STR (GLOVE) IMPLANT
GLOVE EXAM NITRILE XS STR PU (GLOVE) IMPLANT
GOWN STRL REUS W/ TWL LRG LVL3 (GOWN DISPOSABLE) ×2 IMPLANT
GOWN STRL REUS W/ TWL XL LVL3 (GOWN DISPOSABLE) IMPLANT
GOWN STRL REUS W/TWL 2XL LVL3 (GOWN DISPOSABLE) IMPLANT
GOWN STRL REUS W/TWL LRG LVL3 (GOWN DISPOSABLE) ×4
GOWN STRL REUS W/TWL XL LVL3 (GOWN DISPOSABLE)
KIT BASIN OR (CUSTOM PROCEDURE TRAY) ×2 IMPLANT
KIT ROOM TURNOVER OR (KITS) ×2 IMPLANT
NDL HYPO 25X1 1.5 SAFETY (NEEDLE) ×1 IMPLANT
NDL SPNL 22GX3.5 QUINCKE BK (NEEDLE) ×1 IMPLANT
NEEDLE HYPO 25X1 1.5 SAFETY (NEEDLE) ×2 IMPLANT
NEEDLE SPNL 22GX3.5 QUINCKE BK (NEEDLE) ×2 IMPLANT
NS IRRIG 1000ML POUR BTL (IV SOLUTION) ×2 IMPLANT
OIL CARTRIDGE MAESTRO DRILL (MISCELLANEOUS) ×2
PACK LAMINECTOMY NEURO (CUSTOM PROCEDURE TRAY) ×2 IMPLANT
PAD ARMBOARD 7.5X6 YLW CONV (MISCELLANEOUS) ×6 IMPLANT
PIN DISTRACTION 14MM (PIN) IMPLANT
PLATE HELIX R 22MM (Plate) ×1 IMPLANT
RUBBERBAND STERILE (MISCELLANEOUS) ×5 IMPLANT
SCREW 4.0X13 (Screw) ×4 IMPLANT
SCREW 4.0X13MM (Screw) ×4 IMPLANT
SPACER CC-ACF 8MM PARALLEL (Bone Implant) ×1 IMPLANT
SPONGE INTESTINAL PEANUT (DISPOSABLE) ×2 IMPLANT
SPONGE SURGIFOAM ABS GEL SZ50 (HEMOSTASIS) ×2 IMPLANT
SUT VIC AB 0 CT1 27 (SUTURE)
SUT VIC AB 0 CT1 27XBRD ANTBC (SUTURE) IMPLANT
SUT VIC AB 3-0 SH 8-18 (SUTURE) ×2 IMPLANT
TOWEL GREEN STERILE (TOWEL DISPOSABLE) ×2 IMPLANT
TOWEL GREEN STERILE FF (TOWEL DISPOSABLE) ×2 IMPLANT
WATER STERILE IRR 1000ML POUR (IV SOLUTION) ×2 IMPLANT

## 2017-09-09 NOTE — Anesthesia Preprocedure Evaluation (Addendum)
Anesthesia Evaluation  Patient identified by MRN, date of birth, ID band Patient awake    Reviewed: Allergy & Precautions, NPO status , Patient's Chart, lab work & pertinent test results  Airway Mallampati: II  TM Distance: >3 FB     Dental   Pulmonary neg pulmonary ROS, sleep apnea , Current Smoker,    breath sounds clear to auscultation       Cardiovascular hypertension, Pt. on medications  Rhythm:Regular Rate:Normal     Neuro/Psych negative neurological ROS  negative psych ROS   GI/Hepatic negative GI ROS, Neg liver ROS,   Endo/Other  diabetes  Renal/GU Renal disease  negative genitourinary   Musculoskeletal negative musculoskeletal ROS (+)   Abdominal   Peds negative pediatric ROS (+)  Hematology negative hematology ROS (+)   Anesthesia Other Findings   Reproductive/Obstetrics negative OB ROS                            Anesthesia Physical Anesthesia Plan  ASA: III and emergent  Anesthesia Plan: General   Post-op Pain Management:    Induction: Intravenous  PONV Risk Score and Plan: 2 and Ondansetron, Dexamethasone, Midazolam and Treatment may vary due to age or medical condition  Airway Management Planned: Oral ETT  Additional Equipment:   Intra-op Plan:   Post-operative Plan: Extubation in OR  Informed Consent: I have reviewed the patients History and Physical, chart, labs and discussed the procedure including the risks, benefits and alternatives for the proposed anesthesia with the patient or authorized representative who has indicated his/her understanding and acceptance.   Dental advisory given  Plan Discussed with: CRNA, Anesthesiologist and Surgeon  Anesthesia Plan Comments:        Anesthesia Quick Evaluation

## 2017-09-09 NOTE — Anesthesia Procedure Notes (Signed)
Procedure Name: Intubation Date/Time: 09/09/2017 6:59 PM Performed by: Eligha Bridegroom Pre-anesthesia Checklist: Patient identified, Emergency Drugs available, Suction available, Patient being monitored and Timeout performed Patient Re-evaluated:Patient Re-evaluated prior to induction Oxygen Delivery Method: Circle system utilized Preoxygenation: Pre-oxygenation with 100% oxygen Induction Type: IV induction Ventilation: Mask ventilation without difficulty Laryngoscope Size: 4 and Mac Grade View: Grade I Tube type: Oral Number of attempts: 1 Airway Equipment and Method: Stylet Placement Confirmation: ETT inserted through vocal cords under direct vision,  positive ETCO2 and breath sounds checked- equal and bilateral Secured at: 22 cm Tube secured with: Tape Dental Injury: Teeth and Oropharynx as per pre-operative assessment

## 2017-09-09 NOTE — Transfer of Care (Signed)
Immediate Anesthesia Transfer of Care Note  Patient: Richard Mendoza  Procedure(s) Performed: ANTERIOR CERVICAL DECOMPRESSION/DISCECTOMY FUSION CERVICAL 6- CERVICAL 7 (N/A Neck)  Patient Location: PACU  Anesthesia Type:General  Level of Consciousness: awake and drowsy  Airway & Oxygen Therapy: Patient connected to face mask oxygen  Post-op Assessment: Report given to RN, Post -op Vital signs reviewed and stable and Patient moving all extremities X 4  Post vital signs: Reviewed and stable  Last Vitals:  Vitals:   09/09/17 1530 09/09/17 2056  BP: (!) 147/92   Pulse: (!) 122   Resp: 16   Temp: 36.8 C (P) 36.6 C  SpO2: 100%     Last Pain:  Vitals:   09/09/17 2056  TempSrc:   PainSc: (P) Asleep         Complications: No apparent anesthesia complications

## 2017-09-09 NOTE — Anesthesia Postprocedure Evaluation (Signed)
Anesthesia Post Note  Patient: Richard LeepMichael Chamorro  Procedure(s) Performed: ANTERIOR CERVICAL DECOMPRESSION/DISCECTOMY FUSION CERVICAL 6- CERVICAL 7 (N/A Neck)     Patient location during evaluation: PACU Anesthesia Type: General Level of consciousness: awake Pain management: pain level controlled Vital Signs Assessment: post-procedure vital signs reviewed and stable Respiratory status: spontaneous breathing Cardiovascular status: stable Anesthetic complications: no    Last Vitals:  Vitals:   09/09/17 2140 09/09/17 2145  BP: (!) 140/101   Pulse: 86   Resp: 18   Temp:  36.5 C  SpO2: 99%     Last Pain:  Vitals:   09/09/17 2145  TempSrc:   PainSc: 0-No pain                 Nioka Thorington

## 2017-09-09 NOTE — ED Notes (Signed)
OR called stating they are ready for patient at this time. OR staff made aware that patient has not been seen by ED provider yet. RN in OR requesting to know last time pt had anything PO, pt reports 0900. Rn in OR advised they are okay to take patient at this time and will send staff member down to transport patient up to OR.

## 2017-09-09 NOTE — ED Notes (Signed)
Pt and wife back in ED stating that after leaving they were told by Dr. Kathrine Cordsabel's office that due to insurance he would have to return to ED to be admitted and could not be a direct admit.

## 2017-09-09 NOTE — ED Triage Notes (Signed)
Pt states he has 2 herniated disc in his neck. He is followed by Dr. Franky Machoabbell. He saw him Friday and was told if pain got worse to come to the ER. Pt reports severe neck pain as well as numbness in the left fingers that began last night. Pt ambulatory.

## 2017-09-09 NOTE — H&P (Signed)
BP (!) 147/92 (BP Location: Right Arm)   Pulse (!) 122   Temp 98.2 F (36.8 C) (Oral)   Resp 16   Ht 5\' 10"  (1.778 m)   Wt 88.9 kg (196 lb)   SpO2 100%   BMI 28.12 kg/m  Richard Mendoza is well known to me with a herniated disc at C6/7, and triceps weakness on the left side. He was scheduled for an ACDF sometime in November but his pain has gotten worse over the weekend and he presented to the ED.  No Known Allergies Past Medical History:  Diagnosis Date  . DIABETES MELLITUS, TYPE II, UNCONTROLLED 03/17/2009  . DM 12/08/2008  . HYPERLIPIDEMIA 03/17/2009  . HYPERTENSION 12/08/2008  . Hypertension   . OBSTRUCTIVE SLEEP APNEA 12/08/2008  . 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 APPENDECTOMY    . ROTATOR CUFF REPAIR Left    Family History  Problem Relation Age of Onset  . Diabetes Mother   . Heart disease Father   . Diabetes Father   . Arthritis Other   . Hyperlipidemia Other   . Hypertension Other   . Cancer Other        breast   Social History   Social History  . Marital status: Married    Spouse name: N/A  . Number of children: N/A  . Years of education: N/A   Occupational History  . Not on file.   Social History Main Topics  . Smoking status: Current Every Day Smoker    Packs/day: 1.00    Years: 17.00    Types: Cigarettes  . Smokeless tobacco: Never Used     Comment: currently smoking 1 ppd.    . Alcohol use No  . Drug use: No  . Sexual activity: Not on file   Other Topics Concern  . Not on file   Social History Narrative  . No narrative on file   Physical Exam  Constitutional: He is oriented to person, place, and time. He appears well-developed and well-nourished. He appears distressed.  HENT:  Head: Normocephalic and atraumatic.  Right Ear: External ear normal.  Left Ear: External ear normal.  Mouth/Throat: Oropharynx is clear and moist.  Eyes: Pupils are equal, round, and reactive to light. Conjunctivae and  EOM are normal.  Neck: Normal range of motion. Neck supple.  Cardiovascular: Normal rate, regular rhythm, normal heart sounds and intact distal pulses.   Pulmonary/Chest: Effort normal and breath sounds normal.  Abdominal: Soft. Bowel sounds are normal.  Musculoskeletal: Normal range of motion.  Neurological: He is alert and oriented to person, place, and time. He has normal reflexes. A sensory deficit is present. No cranial nerve deficit. He displays a negative Romberg sign. Coordination and gait normal. He displays no Babinski's sign on the right side. He displays no Babinski's sign on the left side.  Reflex Scores:      Tricep reflexes are 2+ on the right side and 2+ on the left side.      Bicep reflexes are 2+ on the right side and 2+ on the left side.      Brachioradialis reflexes are 2+ on the right side and 2+ on the left side.      Patellar reflexes are 2+ on the right side and 2+ on the left side.      Achilles reflexes are 2+ on the right side and 2+ on the left side. Left triceps 4/5 Otherwise strength is normal  Admit for ACDF C6/7 secondary to Hnp at C6/7 eccentric to the left.

## 2017-09-09 NOTE — ED Notes (Signed)
Pt up to nurse first stating that they spoke with Dr. Lottie Musselabels office and they are going to direct admit patient at this time so he will not need to wait to be seen in the ED.

## 2017-09-09 NOTE — Op Note (Signed)
09/09/2017  8:51 PM  PATIENT:  Richard Mendoza  41 y.o. male  PRE-OPERATIVE DIAGNOSIS:  OTHER CERVICAL DISC DISPLACEMENT AT CERVICAL 6- CERVICAL 7 LEVEL  POST-OPERATIVE DIAGNOSIS:  OTHER CERVICAL DISC DISPLACEMENT AT CERVICAL 6- CERVICAL 7 LEVEL  PROCEDURE:  Anterior Cervical decompression C6/7 Arthrodesis C6-7 with 8mm structural allograft Anterior instrumentation(nuvasive helix) C6-7  SURGEON:   Surgeon(s): Coletta Memosabbell, Ardenia Stiner, MD Julio SicksPool, Henry, MD   ASSISTANTS:pool, henry  ANESTHESIA:   general  EBL:  Total I/O In: 1600 [I.V.:1600] Out: 25 [Blood:25]  BLOOD ADMINISTERED:none  CELL SAVER GIVEN:none  COUNT:per nursing  DRAINS: none   SPECIMEN:  No Specimen  DICTATION: Richard Mendoza was taken to the operating room, intubated, and placed under general anesthesia without difficulty. He was positioned supine with her head in slight extension on a horseshoe headrest. The neck was prepped and draped in a sterile manner. I infiltrated 5 cc's 1/2%lidocaine/1:200,000 strength epinephrine into the planned incision starting from the midline to the medial border of the left sternocleidomastoid muscle. I opened the incision with a 10 blade and dissected sharply through soft tissue to the platysma. I dissected in the plane superior to the platysma both rostrally and caudally. I then opened the platysma in a horizontal fashion with Metzenbaum scissors, and dissected in the inferior plane rostrally and caudally. With both blunt and sharp technique I created an avascular corridor to the cervical spine. I placed a spinal needle(s) in the disc space at 6/7 . I then reflected the longus colli from C6 to C7 and placed self retaining retractors. I opened the disc space(s) at C6/7 with a 15 blade. I removed disc with curettes, Kerrison punches, and the drill. Using the drill I removed osteophytes and prepared for the decompression.  I decompressed the spinal canal and the C7 root(s) with the drill, Kerrison  punches, and the curettes. I used the microscope to aid in microdissection. I removed the posterior longitudinal ligament to fully expose and decompress the thecal sac. I exposed the roots laterally taking down the C6/7 uncovertebral joints. With the decompression complete we moved on to the arthrodesis. I used the drill to level the surfaces of C6/7. I removed soft tissue to prepare the disc space and the bony surfaces. I measured the space and placed a 8mm structural allograft into the disc space.  We then placed the anterior instrumentation. I placed 2 screws in each vertebral body through the plate. I locked the screws into place. Intraoperative xray showed the graft, plate, and screws to be in good position. I irrigated the wound, achieved hemostasis, and closed the wound in layers. I approximated the platysma, and the subcuticular plane with vicryl sutures. I used Dermabond for a sterile dressing.   PLAN OF CARE: Admit for overnight observation  PATIENT DISPOSITION:  PACU - hemodynamically stable.   Delay start of Pharmacological VTE agent (>24hrs) due to surgical blood loss or risk of bleeding:  yes

## 2017-09-10 ENCOUNTER — Encounter (HOSPITAL_COMMUNITY): Payer: Self-pay | Admitting: Neurosurgery

## 2017-09-10 LAB — GLUCOSE, CAPILLARY
GLUCOSE-CAPILLARY: 416 mg/dL — AB (ref 65–99)
GLUCOSE-CAPILLARY: 395 mg/dL — AB (ref 65–99)
Glucose-Capillary: 413 mg/dL — ABNORMAL HIGH (ref 65–99)

## 2017-09-10 LAB — HEMOGLOBIN A1C
Hgb A1c MFr Bld: 11.2 % — ABNORMAL HIGH (ref 4.8–5.6)
MEAN PLASMA GLUCOSE: 275 mg/dL

## 2017-09-10 MED ORDER — INSULIN ASPART 100 UNIT/ML ~~LOC~~ SOLN
15.0000 [IU] | Freq: Once | SUBCUTANEOUS | Status: AC
  Administered 2017-09-10: 15 [IU] via SUBCUTANEOUS

## 2017-09-10 MED ORDER — TIZANIDINE HCL 4 MG PO TABS: 4.0000 mg | ORAL_TABLET | Freq: Four times a day (QID) | ORAL | 0 refills | Status: DC | PRN

## 2017-09-10 MED ORDER — INSULIN ASPART 100 UNIT/ML ~~LOC~~ SOLN: 20.0000 [IU] | Freq: Once | SUBCUTANEOUS | Status: AC

## 2017-09-10 MED ORDER — OXYCODONE HCL 5 MG PO TABS: 5.0000 mg | ORAL_TABLET | Freq: Four times a day (QID) | ORAL | 0 refills | Status: DC | PRN

## 2017-09-10 NOTE — Progress Notes (Signed)
Pt doing well. Pt and wife given D/C instructions with Rx's, verbal understanding was provided. Pt's incision is clean and dry with no sign of infection. Pt's IV was removed prior to D/C. Pt D/C'd home via walking per MD order. Pt is stable @ D/C and has no other needs at this time. Rema FendtAshley Iana Buzan, RN

## 2017-09-10 NOTE — Discharge Instructions (Signed)

## 2017-09-17 ENCOUNTER — Encounter (HOSPITAL_COMMUNITY): Payer: Self-pay | Admitting: Neurosurgery

## 2017-09-24 ENCOUNTER — Encounter (HOSPITAL_COMMUNITY): Payer: Self-pay | Admitting: Neurosurgery

## 2017-10-03 ENCOUNTER — Encounter (HOSPITAL_COMMUNITY): Payer: Self-pay | Admitting: Orthopedic Surgery

## 2017-10-03 ENCOUNTER — Ambulatory Visit (HOSPITAL_COMMUNITY): Payer: Medicaid Other | Admitting: Anesthesiology

## 2017-10-03 ENCOUNTER — Ambulatory Visit (HOSPITAL_COMMUNITY)
Admission: RE | Admit: 2017-10-03 | Discharge: 2017-10-03 | Disposition: A | Discharge status: Home / Self Care | Payer: Medicaid Other | Source: Ambulatory Visit | Attending: Orthopedic Surgery | Admitting: Orthopedic Surgery

## 2017-10-03 ENCOUNTER — Other Ambulatory Visit: Payer: Self-pay | Admitting: Orthopedic Surgery

## 2017-10-03 ENCOUNTER — Encounter (HOSPITAL_COMMUNITY)
Admission: RE | Disposition: A | Discharge status: Home / Self Care | Payer: Self-pay | Source: Ambulatory Visit | Attending: Orthopedic Surgery

## 2017-10-03 DIAGNOSIS — Z794 Long term (current) use of insulin: Secondary | ICD-10-CM | POA: Insufficient documentation

## 2017-10-03 DIAGNOSIS — Z803 Family history of malignant neoplasm of breast: Secondary | ICD-10-CM | POA: Diagnosis not present

## 2017-10-03 DIAGNOSIS — E119 Type 2 diabetes mellitus without complications: Secondary | ICD-10-CM | POA: Diagnosis not present

## 2017-10-03 DIAGNOSIS — Z9889 Other specified postprocedural states: Secondary | ICD-10-CM | POA: Diagnosis not present

## 2017-10-03 DIAGNOSIS — F17210 Nicotine dependence, cigarettes, uncomplicated: Secondary | ICD-10-CM | POA: Insufficient documentation

## 2017-10-03 DIAGNOSIS — Z8349 Family history of other endocrine, nutritional and metabolic diseases: Secondary | ICD-10-CM | POA: Insufficient documentation

## 2017-10-03 DIAGNOSIS — Z8249 Family history of ischemic heart disease and other diseases of the circulatory system: Secondary | ICD-10-CM | POA: Insufficient documentation

## 2017-10-03 DIAGNOSIS — G4733 Obstructive sleep apnea (adult) (pediatric): Secondary | ICD-10-CM | POA: Diagnosis not present

## 2017-10-03 DIAGNOSIS — Z79899 Other long term (current) drug therapy: Secondary | ICD-10-CM | POA: Diagnosis not present

## 2017-10-03 DIAGNOSIS — Z981 Arthrodesis status: Secondary | ICD-10-CM | POA: Insufficient documentation

## 2017-10-03 DIAGNOSIS — I10 Essential (primary) hypertension: Secondary | ICD-10-CM | POA: Insufficient documentation

## 2017-10-03 DIAGNOSIS — L02413 Cutaneous abscess of right upper limb: Secondary | ICD-10-CM | POA: Insufficient documentation

## 2017-10-03 DIAGNOSIS — Z79891 Long term (current) use of opiate analgesic: Secondary | ICD-10-CM | POA: Diagnosis not present

## 2017-10-03 DIAGNOSIS — Z8261 Family history of arthritis: Secondary | ICD-10-CM | POA: Diagnosis not present

## 2017-10-03 DIAGNOSIS — E785 Hyperlipidemia, unspecified: Secondary | ICD-10-CM | POA: Diagnosis not present

## 2017-10-03 DIAGNOSIS — Z833 Family history of diabetes mellitus: Secondary | ICD-10-CM | POA: Insufficient documentation

## 2017-10-03 HISTORY — PX: I & D EXTREMITY: SHX5045

## 2017-10-03 LAB — CBC
HCT: 35.9 % — ABNORMAL LOW (ref 39.0–52.0)
HEMOGLOBIN: 12.5 g/dL — AB (ref 13.0–17.0)
MCH: 30.1 pg (ref 26.0–34.0)
MCHC: 34.8 g/dL (ref 30.0–36.0)
MCV: 86.5 fL (ref 78.0–100.0)
PLATELETS: 286 10*3/uL (ref 150–400)
RBC: 4.15 MIL/uL — AB (ref 4.22–5.81)
RDW: 13 % (ref 11.5–15.5)
WBC: 9.2 10*3/uL (ref 4.0–10.5)

## 2017-10-03 LAB — BASIC METABOLIC PANEL
Anion gap: 8 (ref 5–15)
BUN: 11 mg/dL (ref 6–20)
CHLORIDE: 104 mmol/L (ref 101–111)
CO2: 26 mmol/L (ref 22–32)
CREATININE: 0.89 mg/dL (ref 0.61–1.24)
Calcium: 9.3 mg/dL (ref 8.9–10.3)
GFR calc Af Amer: >60 mL/min (ref >60)
GFR calc non Af Amer: >60 mL/min (ref >60)
GLUCOSE: 326 mg/dL — AB (ref 65–99)
POTASSIUM: 3.5 mmol/L (ref 3.5–5.1)
Sodium: 138 mmol/L (ref 135–145)

## 2017-10-03 LAB — GLUCOSE, CAPILLARY
GLUCOSE-CAPILLARY: 300 mg/dL — AB (ref 65–99)
GLUCOSE-CAPILLARY: 260 mg/dL — AB (ref 65–99)
GLUCOSE-CAPILLARY: 196 mg/dL — AB (ref 65–99)
Glucose-Capillary: 312 mg/dL — ABNORMAL HIGH (ref 65–99)

## 2017-10-03 SURGERY — IRRIGATION AND DEBRIDEMENT EXTREMITY
Anesthesia: General | Site: Wrist | Laterality: Right

## 2017-10-03 MED ORDER — SULFAMETHOXAZOLE-TRIMETHOPRIM 800-160 MG PO TABS: 1.0000 | ORAL_TABLET | Freq: Two times a day (BID) | ORAL | 0 refills | Status: DC

## 2017-10-03 MED ORDER — LIDOCAINE HCL (CARDIAC) 20 MG/ML IV SOLN
INTRAVENOUS | Status: DC | PRN
  Administered 2017-10-03: 60 mg via INTRAVENOUS

## 2017-10-03 MED ORDER — FENTANYL CITRATE (PF) 100 MCG/2ML IJ SOLN
INTRAMUSCULAR | Status: DC | PRN
Start: 2017-10-03 — End: 2017-10-03
  Administered 2017-10-03 (×2): 100 ug via INTRAVENOUS
  Administered 2017-10-03: 50 ug via INTRAVENOUS

## 2017-10-03 MED ORDER — SUCCINYLCHOLINE CHLORIDE 20 MG/ML IJ SOLN
INTRAMUSCULAR | Status: DC | PRN
  Administered 2017-10-03: 100 mg via INTRAVENOUS

## 2017-10-03 MED ORDER — PHENYLEPHRINE HCL 10 MG/ML IJ SOLN
INTRAMUSCULAR | Status: DC | PRN
  Administered 2017-10-03: 40 ug via INTRAVENOUS
  Administered 2017-10-03 (×2): 80 ug via INTRAVENOUS
  Administered 2017-10-03: 120 ug via INTRAVENOUS

## 2017-10-03 MED ORDER — FENTANYL CITRATE (PF) 250 MCG/5ML IJ SOLN
INTRAMUSCULAR | Status: AC
  Filled 2017-10-03: qty 5

## 2017-10-03 MED ORDER — OXYCODONE HCL 5 MG PO TABS
5.0000 mg | ORAL_TABLET | Freq: Once | ORAL | Status: AC
  Administered 2017-10-03: 5 mg via ORAL

## 2017-10-03 MED ORDER — HYDROMORPHONE HCL 1 MG/ML IJ SOLN
0.2500 mg | INTRAMUSCULAR | Status: DC | PRN
  Administered 2017-10-03 (×2): 0.5 mg via INTRAVENOUS

## 2017-10-03 MED ORDER — 0.9 % SODIUM CHLORIDE (POUR BTL) OPTIME
TOPICAL | Status: DC | PRN
  Administered 2017-10-03: 1000 mL

## 2017-10-03 MED ORDER — INSULIN ASPART 100 UNIT/ML ~~LOC~~ SOLN
20.0000 [IU] | SUBCUTANEOUS | Status: AC
Start: 2017-10-03 — End: 2017-10-03
  Administered 2017-10-03: 20 [IU] via SUBCUTANEOUS

## 2017-10-03 MED ORDER — MIDAZOLAM HCL 5 MG/5ML IJ SOLN
INTRAMUSCULAR | Status: DC | PRN
  Administered 2017-10-03: 2 mg via INTRAVENOUS

## 2017-10-03 MED ORDER — BUPIVACAINE HCL (PF) 0.25 % IJ SOLN
INTRAMUSCULAR | Status: AC
  Filled 2017-10-03: qty 30

## 2017-10-03 MED ORDER — MIDAZOLAM HCL 2 MG/2ML IJ SOLN
INTRAMUSCULAR | Status: AC
  Filled 2017-10-03: qty 2

## 2017-10-03 MED ORDER — OXYCODONE HCL 5 MG PO TABS: 5.0000 mg | ORAL_TABLET | Freq: Four times a day (QID) | ORAL | 0 refills | Status: AC | PRN

## 2017-10-03 MED ORDER — INSULIN ASPART 100 UNIT/ML ~~LOC~~ SOLN
8.0000 [IU] | Freq: Once | SUBCUTANEOUS | Status: AC
  Administered 2017-10-03: 8 [IU] via SUBCUTANEOUS

## 2017-10-03 MED ORDER — PROPOFOL 10 MG/ML IV BOLUS
INTRAVENOUS | Status: DC | PRN
  Administered 2017-10-03: 150 mg via INTRAVENOUS

## 2017-10-03 MED ORDER — LACTATED RINGERS IV SOLN
INTRAVENOUS | Status: DC
  Administered 2017-10-03 (×3): via INTRAVENOUS

## 2017-10-03 MED ORDER — BUPIVACAINE HCL (PF) 0.25 % IJ SOLN
INTRAMUSCULAR | Status: DC | PRN
  Administered 2017-10-03: 10 mL

## 2017-10-03 MED ORDER — INSULIN ASPART 100 UNIT/ML ~~LOC~~ SOLN
SUBCUTANEOUS | Status: DC
Start: 2017-10-03 — End: 2017-10-04
  Filled 2017-10-03: qty 1

## 2017-10-03 MED ORDER — HYDROMORPHONE HCL 1 MG/ML IJ SOLN
INTRAMUSCULAR | Status: AC
  Administered 2017-10-03: 0.5 mg via INTRAVENOUS
  Filled 2017-10-03: qty 1

## 2017-10-03 MED ORDER — PROMETHAZINE HCL 25 MG/ML IJ SOLN: 6.2500 mg | INTRAMUSCULAR | Status: DC | PRN

## 2017-10-03 MED ORDER — OXYCODONE HCL 5 MG PO TABS
ORAL_TABLET | ORAL | Status: AC
  Filled 2017-10-03: qty 1

## 2017-10-03 MED ORDER — ONDANSETRON HCL 4 MG/2ML IJ SOLN
INTRAMUSCULAR | Status: DC | PRN
  Administered 2017-10-03: 4 mg via INTRAVENOUS

## 2017-10-03 SURGICAL SUPPLY — 43 items
BANDAGE ACE 4X5 VEL STRL LF (GAUZE/BANDAGES/DRESSINGS) ×1 IMPLANT
BNDG CMPR 75X41 PLY ABS (GAUZE/BANDAGES/DRESSINGS) ×1
BNDG CMPR 9X4 STRL LF SNTH (GAUZE/BANDAGES/DRESSINGS) ×2
BNDG ESMARK 4X9 LF (GAUZE/BANDAGES/DRESSINGS) ×4 IMPLANT
BNDG GAUZE ELAST 4 BULKY (GAUZE/BANDAGES/DRESSINGS) ×1 IMPLANT
BNDG STRETCH 4X75 NS LF (GAUZE/BANDAGES/DRESSINGS) ×1 IMPLANT
CORD BIPOLAR FORCEPS 12FT (ELECTRODE) ×1 IMPLANT
COVER SURGICAL LIGHT HANDLE (MISCELLANEOUS) ×2 IMPLANT
CUFF TOURNIQUET SINGLE 18IN (TOURNIQUET CUFF) ×1 IMPLANT
DECANTER SPIKE VIAL GLASS SM (MISCELLANEOUS) ×2 IMPLANT
DRSG PAD ABDOMINAL 8X10 ST (GAUZE/BANDAGES/DRESSINGS) ×2 IMPLANT
GAUZE PACKING IODOFORM 1/2 (PACKING) ×2 IMPLANT
GAUZE SPONGE 4X4 12PLY STRL (GAUZE/BANDAGES/DRESSINGS) ×1 IMPLANT
GLOVE BIO SURGEON STRL SZ7.5 (GLOVE) ×2 IMPLANT
GLOVE BIOGEL PI IND STRL 6.5 (GLOVE) IMPLANT
GLOVE BIOGEL PI IND STRL 8 (GLOVE) ×1 IMPLANT
GLOVE BIOGEL PI INDICATOR 6.5 (GLOVE) ×1
GLOVE BIOGEL PI INDICATOR 8 (GLOVE) ×1
GLOVE SURG SS PI 6.5 STRL IVOR (GLOVE) ×2 IMPLANT
GOWN STRL REUS W/ TWL LRG LVL3 (GOWN DISPOSABLE) ×1 IMPLANT
GOWN STRL REUS W/ TWL XL LVL3 (GOWN DISPOSABLE) ×1 IMPLANT
GOWN STRL REUS W/TWL LRG LVL3 (GOWN DISPOSABLE) ×2
GOWN STRL REUS W/TWL XL LVL3 (GOWN DISPOSABLE) ×2
KIT BASIN OR (CUSTOM PROCEDURE TRAY) ×2 IMPLANT
KIT ROOM TURNOVER OR (KITS) ×2 IMPLANT
NDL HYPO 25X1 1.5 SAFETY (NEEDLE) IMPLANT
NEEDLE HYPO 25X1 1.5 SAFETY (NEEDLE) ×2 IMPLANT
NS IRRIG 1000ML POUR BTL (IV SOLUTION) ×2 IMPLANT
PACK ORTHO EXTREMITY (CUSTOM PROCEDURE TRAY) ×2 IMPLANT
PAD ABD 8X10 STRL (GAUZE/BANDAGES/DRESSINGS) ×1 IMPLANT
PAD ARMBOARD 7.5X6 YLW CONV (MISCELLANEOUS) ×4 IMPLANT
PAD CAST 4YDX4 CTTN HI CHSV (CAST SUPPLIES) IMPLANT
PADDING CAST COTTON 4X4 STRL (CAST SUPPLIES) ×2
SCRUB BETADINE 4OZ XXX (MISCELLANEOUS) ×2 IMPLANT
SOL PREP POV-IOD 4OZ 10% (MISCELLANEOUS) ×2 IMPLANT
SPONGE LAP 4X18 X RAY DECT (DISPOSABLE) ×2 IMPLANT
SWAB COLLECTION DEVICE MRSA (MISCELLANEOUS) ×1 IMPLANT
SWAB CULTURE ESWAB REG 1ML (MISCELLANEOUS) ×1 IMPLANT
SYR CONTROL 10ML LL (SYRINGE) ×1 IMPLANT
TOWEL OR 17X26 10 PK STRL BLUE (TOWEL DISPOSABLE) ×2 IMPLANT
TUBE CONNECTING 12X1/4 (SUCTIONS) ×2 IMPLANT
UNDERPAD 30X30 (UNDERPADS AND DIAPERS) ×2 IMPLANT
YANKAUER SUCT BULB TIP NO VENT (SUCTIONS) ×2 IMPLANT

## 2017-10-03 NOTE — Op Note (Signed)
[id number] 

## 2017-10-03 NOTE — Brief Op Note (Signed)
10/03/2017  7:16 PM  PATIENT:  Richard Mendoza  41 y.o. male  PRE-OPERATIVE DIAGNOSIS:  Right Wrist infection  POST-OPERATIVE DIAGNOSIS:  Right Wrist infection  PROCEDURE:  Procedure(s): IRRIGATION AND DEBRIDEMENT RIGHT WRIST (Right)  SURGEON:  Surgeon(s) and Role:    Betha Loa* Pati Thinnes, MD - Primary  PHYSICIAN ASSISTANT:   ASSISTANTS: none   ANESTHESIA:   general  EBL:  minimal  BLOOD ADMINISTERED:none  DRAINS: iodoform packing  LOCAL MEDICATIONS USED:  MARCAINE     SPECIMEN:  Source of Specimen:  right distal forearm abscess  DISPOSITION OF SPECIMEN:  micro  COUNTS:  YES  TOURNIQUET:   Total Tourniquet Time Documented: Upper Arm (Right) - 29 minutes Total: Upper Arm (Right) - 29 minutes   DICTATION: .Other Dictation: Dictation Number [id number]  PLAN OF CARE: Discharge to home after PACU  PATIENT DISPOSITION:  PACU - hemodynamically stable.

## 2017-10-03 NOTE — H&P (Signed)
Late entry.  Richard Mendoza is an 41 y.o. male.   Chief Complaint: right wrist infection HPI: 41 yo male states he has been having problems with right wrist x 1 week.  Has had progressively increasing pain and erythema.  Started as a pimple that has worsened.  No fevers, chills, night sweats.  Throbbing pain that reaches 10/10 severity.  Worse with palpation and motion of wrist.  Alleviated by rest.  Labs reviewed: WBC 9.2  Allergies: No Known Allergies  Past Medical History:  Diagnosis Date  . DIABETES MELLITUS, TYPE II, UNCONTROLLED 03/17/2009  . DM 12/08/2008  . HYPERLIPIDEMIA 03/17/2009  . HYPERTENSION 12/08/2008  . Hypertension   . OBSTRUCTIVE SLEEP APNEA 12/08/2008  . 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 ANTERIOR CERVICAL DECOMP/DISCECTOMY FUSION N/A 09/09/2017   Procedure: ANTERIOR CERVICAL DECOMPRESSION/DISCECTOMY FUSION CERVICAL 6- CERVICAL 7;  Surgeon: Ashok Pall, MD;  Location: [place];  Service: Neurosurgery;  Laterality: N/A;  ANTERIOR CERVICAL DECOMPRESSION/DISCECTOMY FUSION CERVICAL 6- CERVICAL 7  . APPENDECTOMY    . ROTATOR CUFF REPAIR Left     Family History: Family History  Problem Relation Age of Onset  . Diabetes Mother   . Heart disease Father   . Diabetes Father   . Arthritis Other   . Hyperlipidemia Other   . Hypertension Other   . Cancer Other        breast    Social History:   reports that he has been smoking cigarettes.  He has a 17.00 pack-year smoking history. he has never used smokeless tobacco. He reports that he does not drink alcohol or use drugs.  Medications: Medications Prior to Admission  Medication Sig Dispense Refill  . atorvastatin (LIPITOR) 20 MG tablet Take 20 mg by mouth daily.  3  . buPROPion (WELLBUTRIN XL) 150 MG 24 hr tablet Take 300 mg daily by mouth.   5  . insulin glargine (LANTUS) 100 UNIT/ML injection Inject 80 Units into the skin every morning. Inject 150 units each morning  and 150 each night with meals.    Marland Kitchen NOVOLOG FLEXPEN 100 UNIT/ML FlexPen Inject 14-20 Units into the skin 3 (three) times daily with meals.  3  . oxyCODONE (ROXICODONE) 5 MG immediate release tablet Take 1 tablet (5 mg total) by mouth every 6 (six) hours as needed for severe pain. 30 tablet 0  . pantoprazole (PROTONIX) 40 MG tablet Take 40 mg by mouth daily.  5  . sildenafil (REVATIO) 20 MG tablet 1-5 pills as needed for ED symptoms (Patient taking differently: Take 20-100 mg by mouth daily as needed (ED). ) 30 tablet 0  . tiZANidine (ZANAFLEX) 4 MG tablet Take 1 tablet (4 mg total) by mouth every 6 (six) hours as needed for muscle spasms. 60 tablet 0  . gabapentin (NEURONTIN) 300 MG capsule Take 300 mg by mouth daily.     Marland Kitchen lisinopril (PRINIVIL,ZESTRIL) 5 MG tablet Take 5 mg by mouth daily.     . OxyCODONE ER (XTAMPZA ER) 9 MG C12A Take 9 mg by mouth every 12 (twelve) hours.    Marland Kitchen oxyCODONE-acetaminophen (PERCOCET) 7.5-325 MG tablet Take 1 tablet by mouth 3 (three) times daily as needed for severe pain.      Results for orders placed or performed during the hospital encounter of 10/03/17 (from the past 48 hour(s))  Glucose, capillary     Status: Abnormal   Collection Time: 10/03/17  4:52 PM  Result Value Ref Range  Glucose-Capillary 312 (H) 65 - 99 mg/dL   Comment 1 Notify RN   CBC     Status: Abnormal   Collection Time: 10/03/17  4:56 PM  Result Value Ref Range   WBC 9.2 4.0 - 10.5 K/uL   RBC 4.15 (L) 4.22 - 5.81 MIL/uL   Hemoglobin 12.5 (L) 13.0 - 17.0 g/dL   HCT 35.9 (L) 39.0 - 52.0 %   MCV 86.5 78.0 - 100.0 fL   MCH 30.1 26.0 - 34.0 pg   MCHC 34.8 30.0 - 36.0 g/dL   RDW 13.0 11.5 - 15.5 %   Platelets 286 150 - 400 K/uL  Basic metabolic panel     Status: Abnormal   Collection Time: 10/03/17  4:56 PM  Result Value Ref Range   Sodium 138 135 - 145 mmol/L   Potassium 3.5 3.5 - 5.1 mmol/L   Chloride 104 101 - 111 mmol/L   CO2 26 22 - 32 mmol/L   Glucose, Bld 326 (H) 65 - 99 mg/dL    BUN 11 6 - 20 mg/dL   Creatinine, Ser 0.89 0.61 - 1.24 mg/dL   Calcium 9.3 8.9 - 10.3 mg/dL   GFR calc non Af Amer >60 >60 mL/min   GFR calc Af Amer >60 >60 mL/min    Comment: (NOTE) The eGFR has been calculated using the CKD EPI equation. This calculation has not been validated in all clinical situations. eGFR's persistently <60 mL/min signify possible Chronic Kidney Disease.    Anion gap 8 5 - 15  Glucose, capillary     Status: Abnormal   Collection Time: 10/03/17  5:40 PM  Result Value Ref Range   Glucose-Capillary 300 (H) 65 - 99 mg/dL    No results found.   A comprehensive review of systems was negative. Review of Systems: No fevers, chills, night sweats, chest pain, shortness of breath, nausea, vomiting, diarrhea, constipation, easy bleeding or bruising, headaches, dizziness, vision changes, fainting.   Height 5' 10"  (1.778 m), weight 87.5 kg (193 lb).  General appearance: alert, cooperative and appears stated age Head: Normocephalic, without obvious abnormality, atraumatic Neck: supple, symmetrical, trachea midline Resp: clear to auscultation bilaterally Cardio: regular rate and rhythm GI: non-tender Extremities: Intact sensation and capillary refill all digits.  +epl/fpl/io.  Right wrist with erythema dorsally more toward ulnar side.  Two small pustular areas.  Pain with motion of wrist and tenderness at the wrist.  Mild wrist swelling.  More swollen over erythematous portion of distal forearm. Pulses: 2+ and symmetric Skin: Skin color, texture, turgor normal. No rashes or lesions Neurologic: Grossly normal Incision/Wound: As above  Assessment/Plan Right distal forearm abscess and possible wrist abscess.  Has had previous joint abscess.  Recommend incision and drainage in OR possibly including wrist joint.  Risks, benefits and alternatives of surgery were discussed including risks of blood loss, infection, damage to nerves/vessels/tendons/ligament/bone, failure of  surgery, need for additional surgery, complication with wound healing, nonunion, malunion, stiffness.  He voiced understanding of these risks and elected to proceed.    Richard Mendoza 10/03/2017, 7:09 PM

## 2017-10-03 NOTE — Anesthesia Preprocedure Evaluation (Addendum)
Anesthesia Evaluation  Patient identified by MRN, date of birth, ID band Patient awake    Reviewed: Allergy & Precautions, NPO status , Patient's Chart, lab work & pertinent test results  History of Anesthesia Complications Negative for: history of anesthetic complications  Airway Mallampati: II  TM Distance: >3 FB     Dental  (+) Poor Dentition, Dental Advisory Given   Pulmonary sleep apnea , Current Smoker,    breath sounds clear to auscultation       Cardiovascular hypertension, Pt. on medications  Rhythm:Regular Rate:Normal     Neuro/Psych negative neurological ROS  negative psych ROS   GI/Hepatic negative GI ROS, Neg liver ROS,   Endo/Other  diabetes  Renal/GU Renal disease  negative genitourinary   Musculoskeletal negative musculoskeletal ROS (+)   Abdominal   Peds negative pediatric ROS (+)  Hematology negative hematology ROS (+)   Anesthesia Other Findings   Reproductive/Obstetrics negative OB ROS                            Anesthesia Physical  Anesthesia Plan  ASA: III and emergent  Anesthesia Plan: General   Post-op Pain Management:    Induction: Intravenous  PONV Risk Score and Plan: 2 and Ondansetron, Dexamethasone, Midazolam and Treatment may vary due to age or medical condition  Airway Management Planned: Oral ETT  Additional Equipment:   Intra-op Plan:   Post-operative Plan: Extubation in OR  Informed Consent: I have reviewed the patients History and Physical, chart, labs and discussed the procedure including the risks, benefits and alternatives for the proposed anesthesia with the patient or authorized representative who has indicated his/her understanding and acceptance.   Dental advisory given  Plan Discussed with: CRNA, Anesthesiologist and Surgeon  Anesthesia Plan Comments:        Anesthesia Quick Evaluation

## 2017-10-03 NOTE — Anesthesia Postprocedure Evaluation (Signed)
Anesthesia Post Note  Patient: Richard Mendoza  Procedure(s) Performed: IRRIGATION AND DEBRIDEMENT RIGHT WRIST (Right Wrist)     Patient location during evaluation: PACU Anesthesia Type: General Level of consciousness: awake and alert Pain management: pain level controlled Vital Signs Assessment: post-procedure vital signs reviewed and stable Respiratory status: spontaneous breathing, nonlabored ventilation and respiratory function stable Cardiovascular status: blood pressure returned to baseline and stable Postop Assessment: no apparent nausea or vomiting Anesthetic complications: no    Last Vitals:  Vitals:   10/03/17 2003 10/03/17 2018  BP: 125/82 124/88  Pulse: 82 83  Resp: 20 16  Temp:    SpO2: 100% 100%    Last Pain:  Vitals:   10/03/17 1957  PainSc: 4                  Galdino Hinchman,W. EDMOND

## 2017-10-03 NOTE — Discharge Instructions (Addendum)

## 2017-10-03 NOTE — Transfer of Care (Signed)
Immediate Anesthesia Transfer of Care Note  Patient: Richard Mendoza  Procedure(s) Performed: IRRIGATION AND DEBRIDEMENT RIGHT WRIST (Right Wrist)  Patient Location: PACU  Anesthesia Type:General  Level of Consciousness: awake, alert , oriented and patient cooperative  Airway & Oxygen Therapy: Patient Spontanous Breathing and Patient connected to nasal cannula oxygen  Post-op Assessment: Report given to RN, Post -op Vital signs reviewed and stable and Patient moving all extremities  Post vital signs: Reviewed and stable  Last Vitals:  Vitals:   10/03/17 1917  Pulse: 84  Resp: 12  Temp: 36.5 C  SpO2: 96%    Last Pain:  Vitals:   10/03/17 1917  PainSc: (P) 0-No pain      Patients Stated Pain Goal: 3 (10/03/17 1652)  Complications: No apparent anesthesia complications

## 2017-10-03 NOTE — Anesthesia Procedure Notes (Signed)
Procedure Name: Intubation Date/Time: 10/03/2017 6:25 PM Performed by: Yesika Rispoli T, CRNA Pre-anesthesia Checklist: Patient identified, Suction available, Emergency Drugs available and Patient being monitored Patient Re-evaluated:Patient Re-evaluated prior to induction Oxygen Delivery Method: Circle system utilized Preoxygenation: Pre-oxygenation with 100% oxygen Induction Type: IV induction, Rapid sequence and Cricoid Pressure applied Ventilation: Mask ventilation without difficulty Laryngoscope Size: Mac and 4 Grade View: Grade I Tube type: Oral Tube size: 7.5 mm Number of attempts: 1 Airway Equipment and Method: Patient positioned with wedge pillow and Stylet Placement Confirmation: ETT inserted through vocal cords under direct vision,  positive ETCO2 and breath sounds checked- equal and bilateral Secured at: 22 cm Tube secured with: Tape Dental Injury: Teeth and Oropharynx as per pre-operative assessment

## 2017-10-04 ENCOUNTER — Encounter (HOSPITAL_COMMUNITY): Payer: Self-pay | Admitting: Orthopedic Surgery

## 2017-10-04 NOTE — Op Note (Addendum)
[name]:  Richard Mendoza, Richard Mendoza              ACCOUNT NO.:  [id number]  MEDICAL RECORD NO.:  [id number]  LOCATION:                                 FACILITY:  PHYSICIAN:  Richard LoaKevin Cindie Rajagopalan, MD        DATE OF BIRTH:  1976-07-27  DATE OF PROCEDURE:  10/03/2017 DATE OF DISCHARGE:                              OPERATIVE REPORT   PREOPERATIVE DIAGNOSIS:  Right forearm and wrist infection.  POSTOPERATIVE DIAGNOSIS:  Right distal forearm abscess.  PROCEDURE:   1. Incision and drainage of right forearm deep abscess  2. Incision and drainage of right wrist radiocarpal joint via separate incision.  SURGEON:  Richard LoaKevin Davy Westmoreland, MD.  ASSISTANT:  None.  ANESTHESIA:  General.  IV FLUIDS:  Per anesthesia flow sheet.  ESTIMATED BLOOD LOSS:  Minimal.  COMPLICATIONS:  None.  SPECIMENS:  Cultures to Micro.  TOURNIQUET TIME:  29 minutes.  DISPOSITION:  Stable to PACU.  INDICATIONS:  [name] is a 41 year old male, who states over the past week, he has had increasing problems with his distal forearm and wrist. He has had increasing pain, swelling, and erythema.  No fevers, chills, or night sweats.  He has had previous joint infection in his knee and is concerned it is in his wrist.  He has had a pustule that has broken open.  On examination, he had erythematous distal forearm with tenderness.  He had pain with passive range of motion of the wrist and tenderness to palpation at the wrist joint.  I recommended incision and drainage in the operating room, possibly to include the wrist joint. Risks, benefits, and alternatives of surgery were discussed including the risk of blood loss; infection; damage to nerves, vessels, tendons, ligaments, bone; failure of surgery; need for additional surgery; complications with wound healing; continued pain; continued infection; need for repeat irrigation and debridement.  He voiced understanding of these risks and elected to proceed.  OPERATIVE COURSE:  After being  identified preoperatively by myself, the patient and I agreed upon procedure and site of procedure.  Surgical site was marked.  Risks, benefits, and alternatives of surgery were reviewed and he wished to proceed.  Surgical consent had been signed. He was transferred to the operating room and placed on the operating room table in supine position with the right upper extremity on arm board.  General anesthesia was induced by anesthesiologist.  Right upper extremity was prepped and draped in normal sterile orthopedic fashion. A surgical pause was performed between surgeons, Anesthesia, and operating room staff; and all were in agreement as to the patient, procedure, and site procedure.  Tourniquet at the proximal aspect of the extremity was inflated to 250 mmHg after exsanguination of limb with an Esmarch bandage.  Incision was made at the ulnar aspect of the distal forearm on the dorsal side.  This was carried into subcutaneous tissues by spreading technique.  This was in the area of erythema.  There was gross purulence and devitalized fat underneath.  Cultures were taken for  aerobes and anaerobes.  The ECU tendon sheath and EDQ muscle belly/sheath were opened in the distal forearm.  There was no gross purulence within.  The DRUJ was  approached from proximally between the radius and ulna.  No gross purulence was encountered.  An incision was made dorsally over the wrist.  This was again carried into subcutaneous tissues by spreading technique.  Bipolar electrocautery was used to obtain hemostasis.  The distal aspect of the retinaculum was released and the extensor tendons of the fourth compartment retracted. The radiocarpal joint was entered.  There was thickened synovium that was not inflamed.  There was no gross purulence.  The wounds were all copiously irrigated with sterile saline.  An additional pustule more proximally in the forearm was opened and incised and this was irrigated as  well.  All wounds were packed with half-inch iodoform gauze.  Some iodoform was placed to keep the wrist capsule open for drainage as necessary.  The wounds were injected with 10 mL of 0.25% plain Marcaine to aid in postoperative analgesia.  They were dressed with sterile 4x4s and ABD and wrapped with a Kerlix bandage.  A volar splint was placed and wrapped with Kerlix and Ace bandage.  Tourniquet was deflated at 29 minutes.  Fingertips were pink with brisk capillary refill after deflation of tourniquet.  The operative drapes were broken down.  The patient was awoken from anesthesia safely.  He was transferred back to the stretcher and taken to PACU in stable condition.  I will see him back in the office in 3-4 days for postoperative followup and wound care.  I will give him a prescription for oxycodone 5 mg one to two p.o. q.6 hours p.r.n. pain, dispensed #20 and Bactrim DS 1 p.o. b.i.d. x7 days.     Richard LoaKevin Joanna Hall, MD     KK/MEDQ  D:  10/03/2017  T:  10/04/2017  Job:  [id number]  Addendum (10/21/17): To clarify location of cultures.

## 2017-10-09 LAB — AEROBIC/ANAEROBIC CULTURE (SURGICAL/DEEP WOUND): GRAM STAIN: NONE SEEN

## 2017-10-09 LAB — AEROBIC/ANAEROBIC CULTURE W GRAM STAIN (SURGICAL/DEEP WOUND)

## 2017-10-20 ENCOUNTER — Emergency Department: Payer: Medicaid Other

## 2017-10-20 ENCOUNTER — Emergency Department
Admission: EM | Admit: 2017-10-20 | Discharge: 2017-10-20 | Disposition: A | Discharge status: Home / Self Care | Payer: Medicaid Other | Attending: Emergency Medicine | Admitting: Emergency Medicine

## 2017-10-20 DIAGNOSIS — Z794 Long term (current) use of insulin: Secondary | ICD-10-CM | POA: Diagnosis not present

## 2017-10-20 DIAGNOSIS — K3184 Gastroparesis: Secondary | ICD-10-CM

## 2017-10-20 DIAGNOSIS — E119 Type 2 diabetes mellitus without complications: Secondary | ICD-10-CM | POA: Insufficient documentation

## 2017-10-20 DIAGNOSIS — Z79899 Other long term (current) drug therapy: Secondary | ICD-10-CM | POA: Diagnosis not present

## 2017-10-20 DIAGNOSIS — F17210 Nicotine dependence, cigarettes, uncomplicated: Secondary | ICD-10-CM | POA: Insufficient documentation

## 2017-10-20 DIAGNOSIS — I10 Essential (primary) hypertension: Secondary | ICD-10-CM | POA: Insufficient documentation

## 2017-10-20 DIAGNOSIS — R1013 Epigastric pain: Secondary | ICD-10-CM | POA: Diagnosis present

## 2017-10-20 LAB — CBC
HCT: 41.2 % (ref 40.0–52.0)
HEMOGLOBIN: 14.1 g/dL (ref 13.0–18.0)
MCH: 29.4 pg (ref 26.0–34.0)
MCHC: 34.3 g/dL (ref 32.0–36.0)
MCV: 85.7 fL (ref 80.0–100.0)
Platelets: 245 10*3/uL (ref 150–440)
RBC: 4.8 MIL/uL (ref 4.40–5.90)
RDW: 13.9 % (ref 11.5–14.5)
WBC: 9.1 10*3/uL (ref 3.8–10.6)

## 2017-10-20 LAB — COMPREHENSIVE METABOLIC PANEL
ALBUMIN: 4.1 g/dL (ref 3.5–5.0)
ALK PHOS: 131 U/L — AB (ref 38–126)
ALT: 17 U/L (ref 17–63)
ANION GAP: 12 (ref 5–15)
AST: 24 U/L (ref 15–41)
BUN: 10 mg/dL (ref 6–20)
CHLORIDE: 99 mmol/L — AB (ref 101–111)
CO2: 25 mmol/L (ref 22–32)
Calcium: 9.4 mg/dL (ref 8.9–10.3)
Creatinine, Ser: 0.81 mg/dL (ref 0.61–1.24)
GFR calc non Af Amer: >60 mL/min (ref >60)
GLUCOSE: 251 mg/dL — AB (ref 65–99)
Potassium: 3.1 mmol/L — ABNORMAL LOW (ref 3.5–5.1)
SODIUM: 136 mmol/L (ref 135–145)
Total Bilirubin: 0.5 mg/dL (ref 0.3–1.2)
Total Protein: 7.9 g/dL (ref 6.5–8.1)

## 2017-10-20 LAB — TROPONIN I: Troponin I: <0.03 ng/mL (ref <0.03)

## 2017-10-20 LAB — LIPASE, BLOOD: LIPASE: 26 U/L (ref 11–51)

## 2017-10-20 MED ORDER — IOPAMIDOL (ISOVUE-300) INJECTION 61%
30.0000 mL | Freq: Once | INTRAVENOUS | Status: AC | PRN
  Administered 2017-10-20: 30 mL via ORAL

## 2017-10-20 MED ORDER — METOCLOPRAMIDE HCL 10 MG PO TABS: 10.0000 mg | ORAL_TABLET | Freq: Three times a day (TID) | ORAL | 0 refills | Status: DC | PRN

## 2017-10-20 MED ORDER — SODIUM CHLORIDE 0.9 % IV BOLUS (SEPSIS)
1000.0000 mL | Freq: Once | INTRAVENOUS | Status: AC
  Administered 2017-10-20: 1000 mL via INTRAVENOUS

## 2017-10-20 MED ORDER — FENTANYL CITRATE (PF) 100 MCG/2ML IJ SOLN
50.0000 ug | Freq: Once | INTRAMUSCULAR | Status: AC
  Administered 2017-10-20: 50 ug via INTRAVENOUS
  Filled 2017-10-20: qty 2

## 2017-10-20 MED ORDER — IOPAMIDOL (ISOVUE-300) INJECTION 61%
100.0000 mL | Freq: Once | INTRAVENOUS | Status: AC | PRN
  Administered 2017-10-20: 100 mL via INTRAVENOUS

## 2017-10-20 MED ORDER — METOCLOPRAMIDE HCL 5 MG/ML IJ SOLN
10.0000 mg | Freq: Once | INTRAMUSCULAR | Status: AC
  Administered 2017-10-20: 10 mg via INTRAVENOUS
  Filled 2017-10-20: qty 2

## 2017-10-20 NOTE — ED Provider Notes (Signed)
[facility] Emergency Department Provider Note  ____________________________________________   I have reviewed the triage vital signs and the nursing notes.   HISTORY  Chief Complaint Abdominal Pain    HPI Richard Mendoza is a 41 y.o. male history of diabetes and what is thought to be likely gastroparesis.  Patient has been seen and evaluated by us over the last several months as well as GI medicine and had a negative CT scan negative ultrasound.  He has had recurrent epigastric abdominal pain worse with food.  No melena no bright red blood per rectum.  He states that he is not having significant.  He states he has had some nausea and nearly threw up a few times.  He denies any melena or bright red blood per rectum or hematemesis.  Patient very anxious and upset.  He is on chronic narcotic pain medication for hand issues as well as back and neck issues.  Apparently, being on narcotics limits the ability of GI to evaluate for gastroparesis patient states.  In any event, he is now been having the same symptoms that he has been having off and on since early October again for the last several days.  He denies any fever or chills.   Past Medical History:  Diagnosis Date  . DIABETES MELLITUS, TYPE II, UNCONTROLLED 03/17/2009  . DM 12/08/2008  . HYPERLIPIDEMIA 03/17/2009  . HYPERTENSION 12/08/2008  . Hypertension   . OBSTRUCTIVE SLEEP APNEA 12/08/2008  . 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 HNP (herniated nucleus pulposus), cervical 09/09/2017  . Routine general medical examination at a health care facility 01/20/2014  . 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 HYPERLIPIDEMIA 03/17/2009  . DM 12/08/2008  . OBSTRUCTIVE SLEEP APNEA 12/08/2008  . Essential hypertension 12/08/2008    Past Surgical History:  Procedure Laterality Date  . ANTERIOR CERVICAL  DECOMP/DISCECTOMY FUSION N/A 09/09/2017   Procedure: ANTERIOR CERVICAL DECOMPRESSION/DISCECTOMY FUSION CERVICAL 6- CERVICAL 7;  Surgeon: Coletta Memosabbell, Kyle, MD;  Location: [facility] OR;  Service: Neurosurgery;  Laterality: N/A;  ANTERIOR CERVICAL DECOMPRESSION/DISCECTOMY FUSION CERVICAL 6- CERVICAL 7  . APPENDECTOMY    . I&D EXTREMITY Right 10/03/2017   Procedure: IRRIGATION AND DEBRIDEMENT RIGHT WRIST;  Surgeon: Betha LoaKuzma, Kevin, MD;  Location: [facility] OR;  Service: Orthopedics;  Laterality: Right;  . ROTATOR CUFF REPAIR Left     Prior to Admission medications   Medication Sig Start Date End Date Taking? Authorizing Provider  atorvastatin (LIPITOR) 20 MG tablet Take 20 mg by mouth daily. 08/23/17   [provider]  buPROPion (WELLBUTRIN XL) 150 MG 24 hr tablet Take 300 mg daily by mouth.  08/23/17   [provider]  gabapentin (NEURONTIN) 300 MG capsule Take 300 mg by mouth daily.  10/11/15 09/09/17  [provider]  insulin glargine (LANTUS) 100 UNIT/ML injection Inject 80 Units into the skin every morning. Inject 150 units each morning and 150 each night with meals.    [provider]  lisinopril (PRINIVIL,ZESTRIL) 5 MG tablet Take 5 mg by mouth daily.  10/07/15 09/09/17  [provider]  NOVOLOG FLEXPEN 100 UNIT/ML FlexPen Inject 14-20 Units into the skin 3 (three) times daily with meals. 08/20/17   [provider]  oxyCODONE (ROXICODONE) 5 MG immediate release tablet Take 1 tablet (5 mg total) by mouth every 6 (six) hours as needed for severe pain. 09/10/17   Coletta Memosabbell, Kyle,  MD  oxyCODONE (ROXICODONE) 5 MG immediate release tablet Take 1 tablet (5 mg total) every 6 (six) hours as needed by mouth. 10/03/17 10/03/18  Betha Loa, MD  OxyCODONE ER (XTAMPZA ER) 9 MG C12A Take 9 mg by mouth every 12 (twelve) hours.    [provider]  oxyCODONE-acetaminophen (PERCOCET) 7.5-325 MG tablet Take 1 tablet by mouth 3 (three) times daily as needed for severe pain.     [provider]  pantoprazole (PROTONIX) 40 MG tablet Take 40 mg by mouth daily. 08/23/17   [provider]  sildenafil (REVATIO) 20 MG tablet 1-5 pills as needed for ED symptoms Patient taking differently: Take 20-100 mg by mouth daily as needed (ED).  09/26/15   Romero Belling, MD  sulfamethoxazole-trimethoprim (BACTRIM DS) 800-160 MG tablet Take 1 tablet 2 (two) times daily by mouth. 10/03/17   Betha Loa, MD  tiZANidine (ZANAFLEX) 4 MG tablet Take 1 tablet (4 mg total) by mouth every 6 (six) hours as needed for muscle spasms. 09/10/17   Coletta Memos, MD    Allergies Patient has no known allergies.  Family History  Problem Relation Age of Onset  . Diabetes Mother   . Heart disease Father   . Diabetes Father   . Arthritis Other   . Hyperlipidemia Other   . Hypertension Other   . Cancer Other        breast    Social History Social History   Tobacco Use  . Smoking status: Current Every Day Smoker    Packs/day: 1.00    Years: 17.00    Pack years: 17.00    Types: Cigarettes  . Smokeless tobacco: Never Used  . Tobacco comment: currently smoking 1 ppd.    Substance Use Topics  . Alcohol use: No  . Drug use: No    Review of Systems Constitutional: No fever/chills Eyes: No visual changes. ENT: No sore throat. No stiff neck no neck pain Cardiovascular: Denies chest pain. Respiratory: Denies shortness of breath. Gastrointestinal:   no vomiting.  No diarrhea.  No constipation. Genitourinary: Negative for dysuria. Musculoskeletal: Negative lower extremity swelling Skin: Negative for rash. Neurological: Negative for severe headaches, focal weakness or numbness.   ____________________________________________   PHYSICAL EXAM:  VITAL SIGNS: ED Triage Vitals  Enc Vitals Group     BP 10/20/17 1005 (!) 176/110     Pulse Rate 10/20/17 1005 86     Resp 10/20/17 1005 14     Temp 10/20/17 1005 98.2 F (36.8 C)     Temp Source 10/20/17 1005 Oral     SpO2  10/20/17 1005 100 %     Weight 10/20/17 1006 190 lb (86.2 kg)     Height 10/20/17 1006 5\' 10"  (1.778 m)     Head Circumference --      Peak Flow --      Pain Score 10/20/17 1005 9     Pain Loc --      Pain Edu? --      Excl. in GC? --     Constitutional: Alert and oriented. Well appearing and in no acute distress.  Patient is holding his stomach.  Appears mildly uncomfortable Eyes: Conjunctivae are normal Head: Atraumatic HEENT: No congestion/rhinnorhea. Mucous membranes are moist.  Oropharynx non-erythematous Neck:   Nontender with no meningismus, no masses, no stridor Cardiovascular: Normal rate, regular rhythm. Grossly normal heart sounds.  Good peripheral circulation. Respiratory: Normal respiratory effort.  No retractions. Lungs CTAB. Abdominal: Soft and positive for epigastric  discomfort. No distention. No guarding no rebound Back:  There is no focal tenderness or step off.  there is no midline tenderness there are no lesions noted. there is no CVA tenderness Musculoskeletal: No lower extremity tenderness, no upper extremity tenderness. No joint effusions, no DVT signs strong distal pulses no edema Neurologic:  Normal speech and language. No gross focal neurologic deficits are appreciated.  Skin:  Skin is warm, dry and intact. No rash noted. Psychiatric: Mood and affect are very anxious. Speech and behavior are normal.  ____________________________________________   LABS (all labs ordered are listed, but only abnormal results are displayed)  Labs Reviewed  COMPREHENSIVE METABOLIC PANEL - Abnormal; Notable for the following components:      Result Value   Potassium 3.1 (*)    Chloride 99 (*)    Glucose, Bld 251 (*)    Alkaline Phosphatase 131 (*)    All other components within normal limits  LIPASE, BLOOD  CBC  TROPONIN I  URINALYSIS, COMPLETE (UACMP) WITH MICROSCOPIC    Pertinent labs  results that were available during my care of the patient were reviewed by me and  considered in my medical decision making (see chart for details). ____________________________________________  EKG  I personally interpreted any EKGs ordered by me or triage Normal sinus rhythm at 90 bpm no acute ST elevation or depression LAD noted. ____________________________________________  RADIOLOGY  Pertinent labs & imaging results that were available during my care of the patient were reviewed by me and considered in my medical decision making (see chart for details). If possible, patient and/or family made aware of any abnormal findings.  No results found. ____________________________________________    PROCEDURES  Procedure(s) performed: None  Procedures  Critical Care performed: None  ____________________________________________   INITIAL IMPRESSION / ASSESSMENT AND PLAN / ED COURSE  Pertinent labs & imaging results that were available during my care of the patient were reviewed by me and considered in my medical decision making (see chart for details).  She with a nonsurgical abdomen, chronic abdominal pain with a recent negative CT scan 2 months ago however, he did try p.o. challenge and patient states that he is unable to eat or drink and the pain is too severe.  This is most likely gastro-paretic t issue, his blood work is reassuring as are his vitals however we will repeat CT scan to make sure that there is been no interval development of significant pathology.  Signed out at the end of my shift.    ____________________________________________   FINAL CLINICAL IMPRESSION(S) / ED DIAGNOSES  Final diagnoses:  None      This chart was dictated using voice recognition software.  Despite best efforts to proofread,  errors can occur which can change meaning.      Jeanmarie PlantMcShane, Myrle Dues A, MD 10/20/17 [phone]

## 2017-10-20 NOTE — ED Provider Notes (Signed)
-----------------------------------------   5:32 PM on 10/20/2017 -----------------------------------------  I took over care on this patient from Dr. Alphonzo LemmingsMcShane.  The plan was to follow-up CT and reassess patient's clinical status, with likely diagnosis of gastroparesis.  CT is negative for any acute findings.  On reassessment, patient states that his symptoms improved significantly after the Reglan.  He is tolerating p.o. and was able to tolerate the entire contrast without vomiting.  He states that he would like to go home.  Given the significant improvement of symptoms with the Reglan, I will prescribe for p.o. Reglan for him to take at home, and he requests referral to a new gastroenterologist since his is away for some time.  Return precautions given, and patient expresses understanding.   Dionne BucySiadecki, Gerren Hoffmeier, MD 10/20/17 [phone]

## 2017-10-20 NOTE — ED Triage Notes (Signed)
Pt presents c/o N/V and abd cramping since Wednesday. Seen PCP and had blood work done per pt report. Reports seen x1 month ago with same symptoms but symptoms resolved.

## 2017-10-20 NOTE — ED Notes (Signed)
Pt discharged to home.  Family member driving.  Discharge instructions reviewed.  Verbalized understanding.  No questions or concerns at this time.  Teach back verified.  Pt in NAD.  No items left in ED.   

## 2017-10-20 NOTE — ED Notes (Signed)
Ct was called by this tech to inform them that the patient has finished their contrast bottle

## 2017-10-20 NOTE — Discharge Instructions (Signed)
Return to the ER for new, worsening, or persistent nausea and vomiting, abdominal pain, inability to tolerate anything by mouth, fevers, weakness, or any other new or worsening symptoms that concern you.

## 2017-10-21 ENCOUNTER — Observation Stay (HOSPITAL_COMMUNITY)
Admission: EM | Admit: 2017-10-21 | Discharge: 2017-10-22 | Disposition: A | Discharge status: Home / Self Care | Payer: Medicaid Other | Attending: Internal Medicine | Admitting: Internal Medicine

## 2017-10-21 ENCOUNTER — Encounter (HOSPITAL_COMMUNITY): Payer: Self-pay | Admitting: Emergency Medicine

## 2017-10-21 DIAGNOSIS — E1165 Type 2 diabetes mellitus with hyperglycemia: Secondary | ICD-10-CM | POA: Diagnosis not present

## 2017-10-21 DIAGNOSIS — Z794 Long term (current) use of insulin: Secondary | ICD-10-CM | POA: Diagnosis not present

## 2017-10-21 DIAGNOSIS — E876 Hypokalemia: Secondary | ICD-10-CM | POA: Diagnosis not present

## 2017-10-21 DIAGNOSIS — R112 Nausea with vomiting, unspecified: Principal | ICD-10-CM | POA: Diagnosis present

## 2017-10-21 DIAGNOSIS — I10 Essential (primary) hypertension: Secondary | ICD-10-CM | POA: Diagnosis not present

## 2017-10-21 DIAGNOSIS — Z79899 Other long term (current) drug therapy: Secondary | ICD-10-CM | POA: Diagnosis not present

## 2017-10-21 DIAGNOSIS — E119 Type 2 diabetes mellitus without complications: Secondary | ICD-10-CM | POA: Insufficient documentation

## 2017-10-21 DIAGNOSIS — F17210 Nicotine dependence, cigarettes, uncomplicated: Secondary | ICD-10-CM | POA: Diagnosis not present

## 2017-10-21 LAB — CBC
HCT: 36.9 % — ABNORMAL LOW (ref 39.0–52.0)
Hemoglobin: 12.9 g/dL — ABNORMAL LOW (ref 13.0–17.0)
MCH: 29.4 pg (ref 26.0–34.0)
MCHC: 35 g/dL (ref 30.0–36.0)
MCV: 84.1 fL (ref 78.0–100.0)
PLATELETS: 232 10*3/uL (ref 150–400)
RBC: 4.39 MIL/uL (ref 4.22–5.81)
RDW: 13.4 % (ref 11.5–15.5)
WBC: 8.5 10*3/uL (ref 4.0–10.5)

## 2017-10-21 LAB — COMPREHENSIVE METABOLIC PANEL
ALBUMIN: 4 g/dL (ref 3.5–5.0)
ALK PHOS: 119 U/L (ref 38–126)
ALT: 15 U/L — AB (ref 17–63)
AST: 20 U/L (ref 15–41)
Anion gap: 9 (ref 5–15)
BUN: 9 mg/dL (ref 6–20)
CALCIUM: 9.5 mg/dL (ref 8.9–10.3)
CO2: 26 mmol/L (ref 22–32)
CREATININE: 0.74 mg/dL (ref 0.61–1.24)
Chloride: 102 mmol/L (ref 101–111)
GFR calc non Af Amer: >60 mL/min (ref >60)
GLUCOSE: 238 mg/dL — AB (ref 65–99)
Potassium: 3 mmol/L — ABNORMAL LOW (ref 3.5–5.1)
SODIUM: 137 mmol/L (ref 135–145)
Total Bilirubin: 1.3 mg/dL — ABNORMAL HIGH (ref 0.3–1.2)
Total Protein: 7.5 g/dL (ref 6.5–8.1)

## 2017-10-21 LAB — URINALYSIS, ROUTINE W REFLEX MICROSCOPIC
Bacteria, UA: NONE SEEN
Bilirubin Urine: NEGATIVE
Ketones, ur: 5 mg/dL — AB
Leukocytes, UA: NEGATIVE
Nitrite: NEGATIVE
PH: 7 (ref 5.0–8.0)
Protein, ur: NEGATIVE mg/dL
Specific Gravity, Urine: 1.008 (ref 1.005–1.030)

## 2017-10-21 LAB — LIPASE, BLOOD: Lipase: 24 U/L (ref 11–51)

## 2017-10-21 LAB — PHOSPHORUS: Phosphorus: 2.7 mg/dL (ref 2.5–4.6)

## 2017-10-21 LAB — CBG MONITORING, ED
GLUCOSE-CAPILLARY: 193 mg/dL — AB (ref 65–99)
Glucose-Capillary: 168 mg/dL — ABNORMAL HIGH (ref 65–99)

## 2017-10-21 LAB — MAGNESIUM: Magnesium: 1.9 mg/dL (ref 1.7–2.4)

## 2017-10-21 MED ORDER — LISINOPRIL 5 MG PO TABS
5.0000 mg | ORAL_TABLET | Freq: Every day | ORAL | Status: DC
  Administered 2017-10-21 – 2017-10-22 (×2): 5 mg via ORAL
  Filled 2017-10-21 (×5): qty 1

## 2017-10-21 MED ORDER — BUPROPION HCL ER (XL) 300 MG PO TB24
300.0000 mg | ORAL_TABLET | Freq: Every day | ORAL | Status: DC
  Administered 2017-10-21 – 2017-10-22 (×2): 300 mg via ORAL
  Filled 2017-10-21 (×2): qty 2

## 2017-10-21 MED ORDER — CHLORHEXIDINE GLUCONATE 4 % EX LIQD
60.0000 mL | Freq: Once | CUTANEOUS | Status: DC
  Filled 2017-10-21: qty 60

## 2017-10-21 MED ORDER — INSULIN GLARGINE 100 UNIT/ML ~~LOC~~ SOLN
40.0000 [IU] | Freq: Every day | SUBCUTANEOUS | Status: DC
  Administered 2017-10-22: 40 [IU] via SUBCUTANEOUS
  Filled 2017-10-21 (×2): qty 0.4

## 2017-10-21 MED ORDER — LACTATED RINGERS IV SOLN
INTRAVENOUS | Status: AC
  Administered 2017-10-22: via INTRAVENOUS

## 2017-10-21 MED ORDER — POTASSIUM CHLORIDE 10 MEQ/100ML IV SOLN
10.0000 meq | INTRAVENOUS | Status: AC
  Administered 2017-10-21 – 2017-10-22 (×2): 10 meq via INTRAVENOUS
  Filled 2017-10-21: qty 100

## 2017-10-21 MED ORDER — PANTOPRAZOLE SODIUM 40 MG PO TBEC
40.0000 mg | DELAYED_RELEASE_TABLET | Freq: Two times a day (BID) | ORAL | Status: DC
  Administered 2017-10-22: 40 mg via ORAL
  Filled 2017-10-21: qty 1

## 2017-10-21 MED ORDER — GABAPENTIN 300 MG PO CAPS
300.0000 mg | ORAL_CAPSULE | Freq: Every day | ORAL | Status: DC
  Administered 2017-10-21 – 2017-10-22 (×2): 300 mg via ORAL
  Filled 2017-10-21 (×2): qty 1

## 2017-10-21 MED ORDER — ONDANSETRON HCL 4 MG PO TABS: 4.0000 mg | ORAL_TABLET | Freq: Four times a day (QID) | ORAL | Status: DC | PRN

## 2017-10-21 MED ORDER — SENNA 8.6 MG PO TABS
1.0000 | ORAL_TABLET | Freq: Two times a day (BID) | ORAL | Status: DC
  Administered 2017-10-21 – 2017-10-22 (×2): 8.6 mg via ORAL
  Filled 2017-10-21 (×2): qty 1

## 2017-10-21 MED ORDER — ATORVASTATIN CALCIUM 20 MG PO TABS
20.0000 mg | ORAL_TABLET | Freq: Every day | ORAL | Status: DC
  Administered 2017-10-21: 20 mg via ORAL
  Filled 2017-10-21: qty 1

## 2017-10-21 MED ORDER — DOCUSATE SODIUM 100 MG PO CAPS
100.0000 mg | ORAL_CAPSULE | Freq: Two times a day (BID) | ORAL | Status: DC
Start: 2017-10-21 — End: 2017-10-22
  Administered 2017-10-21 – 2017-10-22 (×2): 100 mg via ORAL
  Filled 2017-10-21 (×2): qty 1

## 2017-10-21 MED ORDER — POLYETHYLENE GLYCOL 3350 17 G PO PACK
17.0000 g | PACK | Freq: Every day | ORAL | Status: DC | PRN
  Filled 2017-10-21: qty 1

## 2017-10-21 MED ORDER — ENOXAPARIN SODIUM 40 MG/0.4ML ~~LOC~~ SOLN
40.0000 mg | SUBCUTANEOUS | Status: DC
  Administered 2017-10-21: 40 mg via SUBCUTANEOUS
  Filled 2017-10-21 (×2): qty 0.4

## 2017-10-21 MED ORDER — SODIUM CHLORIDE 0.9 % IV SOLN
Freq: Once | INTRAVENOUS | Status: AC
  Administered 2017-10-21: 21:00:00 via INTRAVENOUS

## 2017-10-21 MED ORDER — PANTOPRAZOLE SODIUM 40 MG PO TBEC: 40.0000 mg | DELAYED_RELEASE_TABLET | Freq: Every day | ORAL | Status: DC

## 2017-10-21 MED ORDER — OXYCODONE HCL 5 MG PO TABS
5.0000 mg | ORAL_TABLET | Freq: Four times a day (QID) | ORAL | Status: DC | PRN
  Administered 2017-10-22 (×3): 5 mg via ORAL
  Filled 2017-10-21 (×3): qty 1

## 2017-10-21 MED ORDER — ONDANSETRON HCL 4 MG/2ML IJ SOLN
4.0000 mg | Freq: Once | INTRAMUSCULAR | Status: AC
  Administered 2017-10-21: 4 mg via INTRAVENOUS
  Filled 2017-10-21: qty 2

## 2017-10-21 MED ORDER — INSULIN ASPART 100 UNIT/ML ~~LOC~~ SOLN
0.0000 [IU] | SUBCUTANEOUS | Status: DC
  Administered 2017-10-21: 4 [IU] via SUBCUTANEOUS
  Administered 2017-10-22 (×2): 3 [IU] via SUBCUTANEOUS
  Filled 2017-10-21 (×3): qty 1

## 2017-10-21 MED ORDER — FAMOTIDINE 20 MG PO TABS
20.0000 mg | ORAL_TABLET | Freq: Two times a day (BID) | ORAL | Status: DC
  Administered 2017-10-21 – 2017-10-22 (×2): 20 mg via ORAL
  Filled 2017-10-21 (×2): qty 1

## 2017-10-21 MED ORDER — SODIUM CHLORIDE 0.9 % IV BOLUS (SEPSIS)
1000.0000 mL | Freq: Once | INTRAVENOUS | Status: AC
  Administered 2017-10-21: 1000 mL via INTRAVENOUS

## 2017-10-21 MED ORDER — ONDANSETRON HCL 4 MG/2ML IJ SOLN
4.0000 mg | Freq: Four times a day (QID) | INTRAMUSCULAR | Status: DC | PRN
  Administered 2017-10-22: 4 mg via INTRAVENOUS
  Filled 2017-10-21: qty 2

## 2017-10-21 NOTE — ED Notes (Signed)
U/A per MD order. Urinal provided and placed at bedside, encouraged pt to provide sample when possible. [employer]

## 2017-10-21 NOTE — ED Provider Notes (Signed)
[facility]-EMERGENCY DEPT Provider Note   CSN: [id number] Arrival date & time: 10/21/17  1250     History   Chief Complaint Chief Complaint  Patient presents with  . GI Problem    HPI Richard Mendoza is a 41 y.o. male.  HPI Richard Mendoza is a 41 y.o. male with history of diabetes, hypertension, presents to emergency department complaining of nausea vomiting.  Patient states he has had nausea and vomiting for almost a week.  He has been seen by his primary care doctor, was seen in the ER yesterday and treated with medications and had negative CT abdomen pelvis, was seen by Dr. Elnoria HowardHung gastroenterology today and was told to come to the ED to be admitted.  Patient reports epigastric pain.  He reports persistent nausea and vomiting, states that he cannot keep anything down.  Denies any blood in his stool or emesis.  Denies any fever or chills.  He had similar episode a month ago for which she was seen by gastroenterology.  He has not had a chance to have a study done due to being on pain medications at this time from a recent neck surgery and right hand surgery.  Patient states he has been taking Zofran which only helps temporarily.  Also has tried Reglan which does not help.  Patient states that they were going to direct admit him but there were no beds available so he came here.  Past Medical History:  Diagnosis Date  . DIABETES MELLITUS, TYPE II, UNCONTROLLED 03/17/2009  . DM 12/08/2008  . HYPERLIPIDEMIA 03/17/2009  . HYPERTENSION 12/08/2008  . Hypertension   . OBSTRUCTIVE SLEEP APNEA 12/08/2008  . 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 HNP (herniated nucleus pulposus), cervical 09/09/2017  . Routine general medical examination at a health care facility 01/20/2014  . 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 HYPERLIPIDEMIA 03/17/2009  . DM 12/08/2008  .  OBSTRUCTIVE SLEEP APNEA 12/08/2008  . Essential hypertension 12/08/2008    Past Surgical History:  Procedure Laterality Date  . ANTERIOR CERVICAL DECOMP/DISCECTOMY FUSION N/A 09/09/2017   Procedure: ANTERIOR CERVICAL DECOMPRESSION/DISCECTOMY FUSION CERVICAL 6- CERVICAL 7;  Surgeon: Coletta Memosabbell, Kyle, MD;  Location: [facility] OR;  Service: Neurosurgery;  Laterality: N/A;  ANTERIOR CERVICAL DECOMPRESSION/DISCECTOMY FUSION CERVICAL 6- CERVICAL 7  . APPENDECTOMY    . I&D EXTREMITY Right 10/03/2017   Procedure: IRRIGATION AND DEBRIDEMENT RIGHT WRIST;  Surgeon: Betha LoaKuzma, Kevin, MD;  Location: [facility] OR;  Service: Orthopedics;  Laterality: Right;  . ROTATOR CUFF REPAIR Left        Home Medications    Prior to Admission medications   Medication Sig Start Date End Date Taking? Authorizing Provider  atorvastatin (LIPITOR) 20 MG tablet Take 20 mg by mouth daily. 08/23/17   [provider]  buPROPion (WELLBUTRIN XL) 150 MG 24 hr tablet Take 300 mg daily by mouth.  08/23/17   [provider]  gabapentin (NEURONTIN) 300 MG capsule Take 300 mg by mouth daily.  10/11/15 09/09/17  [provider]  insulin glargine (LANTUS) 100 UNIT/ML injection Inject 80 Units into the skin every morning. Inject 150 units each morning and 150 each night with meals.    [provider]  lisinopril (PRINIVIL,ZESTRIL) 5 MG tablet Take 5 mg by mouth daily.  10/07/15 09/09/17  [provider]  metoCLOPramide (REGLAN) 10 MG tablet Take 1 tablet (10 mg total)  by mouth 3 (three) times daily as needed for nausea or vomiting. 10/20/17 11/19/17  Dionne Bucy, MD  NOVOLOG FLEXPEN 100 UNIT/ML FlexPen Inject 14-20 Units into the skin 3 (three) times daily with meals. 08/20/17   [provider]  oxyCODONE (ROXICODONE) 5 MG immediate release tablet Take 1 tablet (5 mg total) by mouth every 6 (six) hours as needed for severe pain. 09/10/17   Coletta Memos, MD  oxyCODONE (ROXICODONE) 5 MG immediate  release tablet Take 1 tablet (5 mg total) every 6 (six) hours as needed by mouth. 10/03/17 10/03/18  Betha Loa, MD  OxyCODONE ER (XTAMPZA ER) 9 MG C12A Take 9 mg by mouth every 12 (twelve) hours.    [provider]  oxyCODONE-acetaminophen (PERCOCET) 7.5-325 MG tablet Take 1 tablet by mouth 3 (three) times daily as needed for severe pain.    [provider]  pantoprazole (PROTONIX) 40 MG tablet Take 40 mg by mouth daily. 08/23/17   [provider]  sildenafil (REVATIO) 20 MG tablet 1-5 pills as needed for ED symptoms Patient taking differently: Take 20-100 mg by mouth daily as needed (ED).  09/26/15   Romero Belling, MD  sulfamethoxazole-trimethoprim (BACTRIM DS) 800-160 MG tablet Take 1 tablet 2 (two) times daily by mouth. 10/03/17   Betha Loa, MD  tiZANidine (ZANAFLEX) 4 MG tablet Take 1 tablet (4 mg total) by mouth every 6 (six) hours as needed for muscle spasms. 09/10/17   Coletta Memos, MD    Family History Family History  Problem Relation Age of Onset  . Diabetes Mother   . Heart disease Father   . Diabetes Father   . Arthritis Other   . Hyperlipidemia Other   . Hypertension Other   . Cancer Other        breast    Social History Social History   Tobacco Use  . Smoking status: Current Every Day Smoker    Packs/day: 1.00    Years: 17.00    Pack years: 17.00    Types: Cigarettes  . Smokeless tobacco: Never Used  . Tobacco comment: currently smoking 1 ppd.    Substance Use Topics  . Alcohol use: No  . Drug use: No     Allergies   Patient has no known allergies.   Review of Systems Review of Systems  Constitutional: Negative for chills and fever.  Respiratory: Negative for cough, chest tightness and shortness of breath.   Cardiovascular: Negative for chest pain, palpitations and leg swelling.  Gastrointestinal: Positive for abdominal pain, nausea and vomiting. Negative for abdominal distention and diarrhea.  Genitourinary: Negative for  dysuria, frequency, hematuria and urgency.  Musculoskeletal: Negative for arthralgias, myalgias, neck pain and neck stiffness.  Skin: Negative for rash.  Allergic/Immunologic: Negative for immunocompromised state.  Neurological: Negative for dizziness, weakness, light-headedness, numbness and headaches.     Physical Exam Updated Vital Signs BP (!) 159/105 (BP Location: Right Arm)   Pulse 100   Temp 98.5 F (36.9 C) (Oral)   Resp 15   SpO2 100%   Physical Exam  Constitutional: He appears well-developed and well-nourished. No distress.  HENT:  Head: Normocephalic and atraumatic.  Eyes: Conjunctivae are normal.  Neck: Neck supple.  Cardiovascular: Normal rate, regular rhythm and normal heart sounds.  Pulmonary/Chest: Effort normal. No respiratory distress. He has no wheezes. He has no rales.  Abdominal: Soft. Bowel sounds are normal. He exhibits no distension. There is no tenderness. There is no rebound.  Musculoskeletal: He exhibits no edema.  Neurological: He is alert.  Skin: Skin is warm and dry.  Nursing note and vitals reviewed.    ED Treatments / Results  Labs (all labs ordered are listed, but only abnormal results are displayed) Labs Reviewed  COMPREHENSIVE METABOLIC PANEL - Abnormal; Notable for the following components:      Result Value   Potassium 3.0 (*)    Glucose, Bld 238 (*)    ALT 15 (*)    Total Bilirubin 1.3 (*)    All other components within normal limits  CBC - Abnormal; Notable for the following components:   Hemoglobin 12.9 (*)    HCT 36.9 (*)    All other components within normal limits  URINALYSIS, ROUTINE W REFLEX MICROSCOPIC - Abnormal; Notable for the following components:   Glucose, UA >=500 (*)    Hgb urine dipstick SMALL (*)    Ketones, ur 5 (*)    Squamous Epithelial / LPF 0-5 (*)    All other components within normal limits  CBG MONITORING, ED - Abnormal; Notable for the following components:   Glucose-Capillary 168 (*)    All other  components within normal limits  LIPASE, BLOOD  MAGNESIUM  PHOSPHORUS  HIV ANTIBODY (ROUTINE TESTING)  BASIC METABOLIC PANEL  CBC  H. PYLORI ANTIGEN, STOOL    EKG  EKG Interpretation None       Radiology Ct Abdomen Pelvis W Contrast  Result Date: 10/20/2017 CLINICAL DATA:  41 year old male with chronic abdominal pain and nausea. EXAM: CT ABDOMEN AND PELVIS WITH CONTRAST TECHNIQUE: Multidetector CT imaging of the abdomen and pelvis was performed using the standard protocol following bolus administration of intravenous contrast. CONTRAST:  ISOVUE-300 IOPAMIDOL (ISOVUE-300) INJECTION 61% COMPARISON:  08/19/2017 CT and ultrasound FINDINGS: Lower chest: No acute abnormality. Hepatobiliary: The liver and gallbladder are unremarkable. No biliary dilatation. Pancreas: Unremarkable Spleen: Unremarkable Adrenals/Urinary Tract: The kidneys, adrenal glands and bladder are unremarkable. Stomach/Bowel: Stomach is within normal limits. No evidence of bowel wall thickening, distention, or inflammatory changes. Vascular/Lymphatic: No significant vascular findings are present. No enlarged abdominal or pelvic lymph nodes. Reproductive: Prostate is unremarkable. Other: No abdominal wall hernia or abnormality. No abdominopelvic ascites. Musculoskeletal: No acute abnormalities or suspicious bony lesions. 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 IMPRESSION: 1. No evidence of acute abnormality or findings to suggest a cause for this patient's abdominal pain. 2. Unchanged calcified right paracentral disc protrusion at L5-S1. Electronically Signed   By: Harmon Pier M.D.   On: 10/20/2017 16:34    Procedures Procedures (including critical care time)  Medications Ordered in ED Medications  sodium chloride 0.9 % bolus 1,000 mL (not administered)  0.9 %  sodium chloride infusion (not administered)  ondansetron (ZOFRAN) injection 4 mg (not administered)     Initial Impression /  Assessment and Plan / ED Course  I have reviewed the triage vital signs and the nursing notes.  Pertinent labs & imaging results that were available during my care of the patient were reviewed by me and considered in my medical decision making (see chart for details).     Patient with persistent nausea and vomiting.  Was seen by gastroenterologist today, Dr. Elnoria Howard who recommended admission.  I spoke with hospitalist they will admit. Fluids and zofran ordered. No acute abdomen on exam today. NAD.   Vitals:   10/21/17 1327 10/21/17 1548 10/21/17 1933  BP: (!) 159/98 (!) 155/97 (!) 159/105  Pulse: 91 88 100  Resp: 16 16 15   Temp: 97.9  F (36.6 C) 98.3 F (36.8 C) 98.5 F (36.9 C)  TempSrc: Oral Oral Oral  SpO2: 100% 100% 100%     Final Clinical Impressions(s) / ED Diagnoses   Final diagnoses:  Intractable vomiting with nausea, unspecified vomiting type    ED Discharge Orders    None       Jaynie CrumbleKirichenko, Darrol Brandenburg, PA-C 10/21/17 2210    Tegeler, Canary Brimhristopher J, MD 10/22/17 [phone]

## 2017-10-21 NOTE — H&P (Signed)
History and Physical    Richard Mendoza NWG:[id number] DOB: [birth year] DOA: 10/21/2017  PCP: Raynelle Bring Patient coming from: Home  I have personally briefly reviewed patient's old medical records in [facility]  Chief Complaint: Nausea, vomiting, abdominal pain  HPI: Richard Mendoza is a 41 y.o. male with medical history significant of poorly controlled insulin-dependent diabetes mellitus type 2, chronic nausea and vomiting, recent MRSA infection of the right wrist currently undergoing outpatient wound care with whirlpool therapy, recent ACDF of C6/C7, hypertension, GERD, OSA, depression and tobacco use who presented to the ED from gastroenterology clinic where he saw Dr. Elnoria Howard today for 1 week of intractable nausea, vomiting and abdominal pain. He states that his symptoms been ongoing since last Wednesday. He describes the abdominal pain as cramping in his epigastrium, with inability to tolerate oral intake and frequent nonbloody, nonbilious emesis. Patient states that he occasionally feels sensation of food getting stuck, however he denies frank regurgitation of food contents. Nausea seems to persistent and is unrelated to food consumption. Patient states that he has had chronic nausea in the morning after awakening for years at this point. 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 He reports adherence with PPI. Patient denies similar symptoms in close contacts. Denies sick contacts. Denies fever, chills, cough, headache, myalgias, arthralgias, rash. Patient's last A1c was 11.2, which is improved from baseline. He has a greater than 10 year history of poorly controlled diabetes mellitus with an initial diagnosis around 2005. Of note, patient was seen in the [facility] ED yesterday for similar symptoms and was discharged after falling resuscitation and a dose of Reglan with improvement in  his symptoms, however, symptoms returned today and he was urged by his gastroneurology office to present for further workup and management.  ED Course: In the ED, patient afebrile, I'll be hypertensive and mildly tachycardic. Labs notable for potassium of 3.0, glucose 238, lipase within normal limits, A1c 11.2 in October 2018. CT abdomen and pelvis was obtained on 12/2 showing no acute findings. Pt was volume resuscitated and given Zofran in the ED prior to admission.  Review of Systems: As per HPI otherwise 10 point review of systems negative.   Past Medical History:  Diagnosis Date  . DIABETES MELLITUS, TYPE II, UNCONTROLLED 03/17/2009  . DM 12/08/2008  . HYPERLIPIDEMIA 03/17/2009  . HYPERTENSION 12/08/2008  . Hypertension   . OBSTRUCTIVE SLEEP APNEA 12/08/2008  . 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 ANTERIOR CERVICAL DECOMP/DISCECTOMY FUSION N/A 09/09/2017   Procedure: ANTERIOR CERVICAL DECOMPRESSION/DISCECTOMY FUSION CERVICAL 6- CERVICAL 7;  Surgeon: Coletta Memos, MD;  Location: [facility] OR;  Service: Neurosurgery;  Laterality: N/A;  ANTERIOR CERVICAL DECOMPRESSION/DISCECTOMY FUSION CERVICAL 6- CERVICAL 7  . APPENDECTOMY    . I&D EXTREMITY Right 10/03/2017   Procedure: IRRIGATION AND DEBRIDEMENT RIGHT WRIST;  Surgeon: Betha Loa, MD;  Location: [facility] OR;  Service: Orthopedics;  Laterality: Right;  . ROTATOR CUFF REPAIR Left      reports that he has been smoking cigarettes.  He has a 17.00 pack-year smoking history. he has never used smokeless tobacco. He reports that he does not drink alcohol or use drugs.  No Known Allergies  Family History  Problem Relation Age of Onset  . Diabetes Mother   . Heart disease Father   . Diabetes Father   . Arthritis Other   . Hyperlipidemia Other   .  Hypertension Other   . Cancer Other        breast    Prior to Admission medications   Medication Sig Start Date End Date Taking? Authorizing Provider   atorvastatin (LIPITOR) 20 MG tablet Take 20 mg by mouth daily. 08/23/17  Yes [provider]  buPROPion (WELLBUTRIN XL) 300 MG 24 hr tablet Take 300 mg daily by mouth.  08/23/17  Yes [provider]  gabapentin (NEURONTIN) 300 MG capsule Take 300 mg by mouth daily. 10/11/15  Yes [provider]  insulin glargine (LANTUS) 100 UNIT/ML injection Inject 80 Units into the skin every morning.    Yes [provider]  lisinopril (PRINIVIL,ZESTRIL) 5 MG tablet Take 5 mg by mouth daily. 10/14/17  Yes [provider]  metoCLOPramide (REGLAN) 10 MG tablet Take 1 tablet (10 mg total) by mouth 3 (three) times daily as needed for nausea or vomiting. 10/20/17 11/19/17 Yes Dionne BucySiadecki, Sebastian, MD  NOVOLOG FLEXPEN 100 UNIT/ML FlexPen Inject 14-20 Units into the skin 3 (three) times daily with meals. 08/20/17  Yes [provider]  oxyCODONE (ROXICODONE) 5 MG immediate release tablet Take 1 tablet (5 mg total) every 6 (six) hours as needed by mouth. 10/03/17 10/03/18 Yes Betha LoaKuzma, Kevin, MD  pantoprazole (PROTONIX) 40 MG tablet Take 40 mg by mouth daily. 08/23/17  Yes [provider]  sildenafil (REVATIO) 20 MG tablet 1-5 pills as needed for ED symptoms Patient taking differently: Take 20-100 mg by mouth daily as needed (ED).  09/26/15  Yes Romero BellingEllison, Sean, MD  oxyCODONE (ROXICODONE) 5 MG immediate release tablet Take 1 tablet (5 mg total) by mouth every 6 (six) hours as needed for severe pain. 09/10/17   Coletta Memosabbell, Kyle, MD  sulfamethoxazole-trimethoprim (BACTRIM DS) 800-160 MG tablet Take 1 tablet 2 (two) times daily by mouth. Patient not taking: Reported on 10/21/2017 10/03/17   Betha LoaKuzma, Kevin, MD  tiZANidine (ZANAFLEX) 4 MG tablet Take 1 tablet (4 mg total) by mouth every 6 (six) hours as needed for muscle spasms. Patient not taking: Reported on 10/21/2017 09/10/17   Coletta Memosabbell, Kyle, MD    Physical Exam: Vitals:   10/21/17 1327 10/21/17 1548 10/21/17 1933 10/21/17 2200   BP: (!) 159/98 (!) 155/97 (!) 159/105 (!) 151/86  Pulse: 91 88 100 79  Resp: 16 16 15  (!) 29  Temp: 97.9 F (36.6 C) 98.3 F (36.8 C) 98.5 F (36.9 C) 98.2 F (36.8 C)  TempSrc: Oral Oral Oral Oral  SpO2: 100% 100% 100% 100%    Constitutional: NAD, calm, comfortable Vitals:   10/21/17 1327 10/21/17 1548 10/21/17 1933 10/21/17 2200  BP: (!) 159/98 (!) 155/97 (!) 159/105 (!) 151/86  Pulse: 91 88 100 79  Resp: 16 16 15  (!) 29  Temp: 97.9 F (36.6 C) 98.3 F (36.8 C) 98.5 F (36.9 C) 98.2 F (36.8 C)  TempSrc: Oral Oral Oral Oral  SpO2: 100% 100% 100% 100%   Eyes: PERRL, lids and conjunctivae normal ENMT: Mucous membranes are moist. Posterior pharynx clear of any exudate or lesions. Neck: normal, supple Respiratory: clear to auscultation bilaterally, no wheezing, no crackles. Cardiovascular: Regular rate and rhythm, no murmurs / rubs / gallops. No extremity edema. 2+ pedal pulses. No carotid bruits.  Abdomen: no tenderness, no masses palpated. No hepatosplenomegaly. Hypoactive bowel sounds.  Musculoskeletal: no clubbing / cyanosis. No joint deformity upper and lower extremities. Good ROM, no contractures. Normal muscle tone.  Skin: no rashes, wound on right wrist s/p extensive debridement, 2 cm lipoma on upper  back. Neurologic: CN 2-12 grossly intact. Sensation intact, DTR normal. Strength 5/5 in all 4.  Psychiatric: Normal judgment and insight. Alert and oriented x 3. Normal mood.   Labs on Admission: I have personally reviewed following labs and imaging studies  CBC: Recent Labs  Lab 10/20/17 1008 10/21/17 2054  WBC 9.1 8.5  HGB 14.1 12.9*  HCT 41.2 36.9*  MCV 85.7 84.1  PLT 245 232   Basic Metabolic Panel: Recent Labs  Lab 10/20/17 1008 10/21/17 2054 10/21/17 2115  NA 136 137  --   K 3.1* 3.0*  --   CL 99* 102  --   CO2 25 26  --   GLUCOSE 251* 238*  --   BUN 10 9  --   CREATININE 0.81 0.74  --   CALCIUM 9.4 9.5  --   MG  --   --  1.9  PHOS  --   --   2.7   GFR: Estimated Creatinine Clearance: 125.5 mL/min (by C-G formula based on SCr of 0.74 mg/dL). Liver Function Tests: Recent Labs  Lab 10/20/17 1008 10/21/17 2054  AST 24 20  ALT 17 15*  ALKPHOS 131* 119  BILITOT 0.5 1.3*  PROT 7.9 7.5  ALBUMIN 4.1 4.0   Recent Labs  Lab 10/20/17 1008 10/21/17 2054  LIPASE 26 24   No results for input(s): AMMONIA in the last 168 hours. Coagulation Profile: No results for input(s): INR, PROTIME in the last 168 hours. Cardiac Enzymes: Recent Labs  Lab 10/20/17 1008  TROPONINI <0.03   BNP (last 3 results) No results for input(s): PROBNP in the last 8760 hours. HbA1C: No results for input(s): HGBA1C in the last 72 hours. CBG: Recent Labs  Lab 10/21/17 1810 10/21/17 2217  GLUCAP 168* 193*   Lipid Profile: No results for input(s): CHOL, HDL, LDLCALC, TRIG, CHOLHDL, LDLDIRECT in the last 72 hours. Thyroid Function Tests: No results for input(s): TSH, T4TOTAL, FREET4, T3FREE, THYROIDAB in the last 72 hours. Anemia Panel: No results for input(s): VITAMINB12, FOLATE, FERRITIN, TIBC, IRON, RETICCTPCT in the last 72 hours. Urine analysis:    Component Value Date/Time   COLORURINE YELLOW 10/21/2017 2100   APPEARANCEUR CLEAR 10/21/2017 2100   LABSPEC 1.008 10/21/2017 2100   PHURINE 7.0 10/21/2017 2100   GLUCOSEU >=500 (A) 10/21/2017 2100   HGBUR SMALL (A) 10/21/2017 2100   BILIRUBINUR NEGATIVE 10/21/2017 2100   KETONESUR 5 (A) 10/21/2017 2100   PROTEINUR NEGATIVE 10/21/2017 2100   UROBILINOGEN 0.2 07/31/2011 2217   NITRITE NEGATIVE 10/21/2017 2100   LEUKOCYTESUR NEGATIVE 10/21/2017 2100    Radiological Exams on Admission: Ct Abdomen Pelvis W Contrast  Result Date: 10/20/2017 CLINICAL DATA:  41 year old male with chronic abdominal pain and nausea. EXAM: CT ABDOMEN AND PELVIS WITH CONTRAST TECHNIQUE: Multidetector CT imaging of the abdomen and pelvis was performed using the standard protocol following bolus administration of  intravenous contrast. CONTRAST:  100mL ISOVUE-300 IOPAMIDOL (ISOVUE-300) INJECTION 61% COMPARISON:  08/19/2017 CT and ultrasound FINDINGS: Lower chest: No acute abnormality. Hepatobiliary: The liver and gallbladder are unremarkable. No biliary dilatation. Pancreas: Unremarkable Spleen: Unremarkable Adrenals/Urinary Tract: The kidneys, adrenal glands and bladder are unremarkable. Stomach/Bowel: Stomach is within normal limits. No evidence of bowel wall thickening, distention, or inflammatory changes. Vascular/Lymphatic: No significant vascular findings are present. No enlarged abdominal or pelvic lymph nodes. Reproductive: Prostate is unremarkable. Other: No abdominal wall hernia or abnormality. No abdominopelvic ascites. Musculoskeletal: No acute abnormalities or suspicious bony lesions. Calcified right paracentral disc protrusion at L5-S1 again  noted. IMPRESSION: 1. No evidence of acute abnormality or findings to suggest a cause for this patient's abdominal pain. 2. Unchanged calcified right paracentral disc protrusion at L5-S1. Electronically Signed   By: Harmon Pier M.D.   On: 10/20/2017 16:34   Assessment/Plan Active Problems:   Intractable nausea and vomiting  Intractable nausea and vomiting Symptoms perhaps consistent with diabetic gastroparesis given the duration and severity, particularly in light of his >10 years of poorly-controlled DM2 (last A1c 11.2%). No evidence for infectious gastroenteritis. Would also consider esophagitis/gastritis given symptoms concerning for esophageal dysphagia and prior h/o antral gastritis. - Consult GI in AM for EGD - NPO after midnight - LR 100 cc/h - Zofran PRN for nausea, consider adding Reglan - Check H pylori stool Ag - Tight glycemic control - Avoid opiates as able - Increase to BID PPI, add Pepcid - Continue home Lipitor, lisinopril, gabapentin - Bowel regimen - Daily BMP, Mg  Poorly-controlled IDDM2 Last A1c 11.2% in October 2018. - Resume Lantus  at 40 U daily (home dose 80 U) - Sliding scale insulin while NPO, would resume home Novolog when diet resumed - FSBS Q4H while NPO  Hypokalemia - Replete K, goal >3.5 - LR as above - Recheck BMP in AM  MRSA right wrist infection s/p debridement Following with Wound Care. Off Abx. Doing whirlpool therapy and dressing changes. - Wound care per Nursing - Dressing change on admission - Consider formal Wound Care consult - Oxycodone PRN for pain  Depression - Continue Wellbutrin  DVT prophylaxis: Lovenox Code Status: Full Family Communication: Seda, [name] (wife) Disposition Plan: Home in 1 day Consults called: None Admission status: Observation   Marcelo Baldy MD [facility] Pager [phone]  If 7PM-7AM, please contact night-coverage [email or web address] Password [email or web address]  10/21/2017, 10:35 PM

## 2017-10-21 NOTE — ED Triage Notes (Signed)
Pt comes in with complaints of nausea and vomiting due to gastroparesis. Pt was seen at [facility] yesterday and had CT done.  Pt was sent here by PCP.  Attempted direct admit but did not have beds available so directed to ED.  Pt ambulatory and complains of severe pain.

## 2017-10-21 NOTE — ED Notes (Signed)
Per nurse he is going to start an IV however hospitalist is at the bedside

## 2017-10-22 ENCOUNTER — Encounter (HOSPITAL_COMMUNITY): Payer: Self-pay | Admitting: Internal Medicine

## 2017-10-22 DIAGNOSIS — E785 Hyperlipidemia, unspecified: Secondary | ICD-10-CM

## 2017-10-22 DIAGNOSIS — R1013 Epigastric pain: Secondary | ICD-10-CM

## 2017-10-22 DIAGNOSIS — E1169 Type 2 diabetes mellitus with other specified complication: Secondary | ICD-10-CM | POA: Diagnosis not present

## 2017-10-22 DIAGNOSIS — R112 Nausea with vomiting, unspecified: Secondary | ICD-10-CM

## 2017-10-22 LAB — BASIC METABOLIC PANEL
ANION GAP: 6 (ref 5–15)
BUN: 8 mg/dL (ref 6–20)
CALCIUM: 8.8 mg/dL — AB (ref 8.9–10.3)
CO2: 26 mmol/L (ref 22–32)
Chloride: 104 mmol/L (ref 101–111)
Creatinine, Ser: 0.7 mg/dL (ref 0.61–1.24)
Glucose, Bld: 122 mg/dL — ABNORMAL HIGH (ref 65–99)
Potassium: 2.9 mmol/L — ABNORMAL LOW (ref 3.5–5.1)
SODIUM: 136 mmol/L (ref 135–145)

## 2017-10-22 LAB — CBC
HCT: 32.8 % — ABNORMAL LOW (ref 39.0–52.0)
HEMOGLOBIN: 11.4 g/dL — AB (ref 13.0–17.0)
MCH: 29.3 pg (ref 26.0–34.0)
MCHC: 34.8 g/dL (ref 30.0–36.0)
MCV: 84.3 fL (ref 78.0–100.0)
Platelets: 205 10*3/uL (ref 150–400)
RBC: 3.89 MIL/uL — AB (ref 4.22–5.81)
RDW: 13.6 % (ref 11.5–15.5)
WBC: 9.5 10*3/uL (ref 4.0–10.5)

## 2017-10-22 LAB — CBG MONITORING, ED
GLUCOSE-CAPILLARY: 116 mg/dL — AB (ref 65–99)
GLUCOSE-CAPILLARY: 141 mg/dL — AB (ref 65–99)
Glucose-Capillary: 137 mg/dL — ABNORMAL HIGH (ref 65–99)

## 2017-10-22 LAB — GLUCOSE, CAPILLARY
Glucose-Capillary: 148 mg/dL — ABNORMAL HIGH (ref 65–99)
Glucose-Capillary: 315 mg/dL — ABNORMAL HIGH (ref 65–99)

## 2017-10-22 LAB — HIV ANTIBODY (ROUTINE TESTING W REFLEX): HIV SCREEN 4TH GENERATION: NONREACTIVE

## 2017-10-22 MED ORDER — POTASSIUM CHLORIDE 20 MEQ PO PACK: 40.0000 meq | PACK | Freq: Two times a day (BID) | ORAL | Status: DC

## 2017-10-22 MED ORDER — POTASSIUM CHLORIDE CRYS ER 20 MEQ PO TBCR
40.0000 meq | EXTENDED_RELEASE_TABLET | Freq: Two times a day (BID) | ORAL | Status: DC
  Administered 2017-10-22: 40 meq via ORAL
  Filled 2017-10-22: qty 2

## 2017-10-22 NOTE — ED Notes (Signed)
2 bags of k+ given per order:  10 m eq per 100  Mar orders off for some reason

## 2017-10-22 NOTE — ED Notes (Signed)
Bed: [id number] Expected date:  Expected time:  Means of arrival:  Comments: Room 2

## 2017-10-22 NOTE — ED Notes (Signed)
Bed: [id number] Expected date:  Expected time:  Means of arrival:  Comments: Hold for room 25

## 2017-10-22 NOTE — Discharge Summary (Addendum)
Discharge Summary  Richard LeepMichael Henson [id number] DOB: March 10, 1976  [facility]  Admit date: 10/21/2017 Discharge date: 10/22/2017  Time spent: 25 minutes   Recommendations for Outpatient Follow-up:  1. Follow up with GI, Dr. Elnoria HowardHung, within a week 2. Follow up with your PCP within a week to repeat BMP 3. Take your medications as prescribed  Discharge Diagnoses:  Active Hospital Problems   Diagnosis Date Noted  . Intractable nausea and vomiting 10/21/2017    Resolved Hospital Problems  No resolved problems to display.    Intractable nausea or vomiting with no sign of gastroparesis, resolved  Chronic abd pain- lipase negative, CT abd/pel with contrast 10/20/17 negative  Hypokalemia- Repleted 40 meq po x 2. K+ 2.9 (10/22/17); repeat BMP in the outpatient setting.   Discharge Condition: Stable  Diet recommendation: Resume previous diet, low fat diet.  Vitals:   10/22/17 1134 10/22/17 1416  BP: (!) 152/97 (!) 149/96  Pulse:    Resp: 18 18  Temp: 98 F (36.7 C) 98.6 F (37 C)  SpO2: 99% 100%    History of present illness:  Richard Mendoza is a 41 y.o. male with medical history significant of poorly controlled insulin-dependent diabetes mellitus type 2, chronic nausea and vomiting, recent MRSA infection of the right wrist currently undergoing outpatient wound care with whirlpool therapy, recent ACDF of C6/C7, hypertension, GERD, OSA, depression and tobacco use who presented to the ED from gastroenterology clinic where he saw Dr. Elnoria HowardHung for 1 week of intractable nausea, vomiting, and abdominal pain. He states that his symptoms been ongoing since last Wednesday.   Since this admission, symptoms are nearly resolved. No vomiting. Minimal abdominal pain on exam. Lipase negative. Blood sugar well controlled.  GI saw the patient with no plan for intervention.  On the day of discharge the patient was hemodynamically stable. Patient will need to follow up with GI and his PCP  within a week. Avoid fatty foods.   Hospital Course:  Active Problems:   Intractable nausea and vomiting   Procedures:  None  Consultations:  GI  Discharge Exam: BP (!) 149/96 (BP Location: Right Arm)   Pulse 92   Temp 98.6 F (37 C) (Oral)   Resp 18   SpO2 100%   General: 41 yo CM WD WN NAD. A&O x3 Cardiovascular: RRR no rubs or gallops Respiratory: CTA with no wheezes or rhonchi  Discharge Instructions You were cared for by a hospitalist during your hospital stay. If you have any questions about your discharge medications or the care you received while you were in the hospital after you are discharged, you can call the unit and asked to speak with the hospitalist on call if the hospitalist that took care of you is not available. Once you are discharged, your primary care physician will handle any further medical issues. Please note that NO REFILLS for any discharge medications will be authorized once you are discharged, as it is imperative that you return to your primary care physician (or establish a relationship with a primary care physician if you do not have one) for your aftercare needs so that they can reassess your need for medications and monitor your lab values.   Allergies as of 10/22/2017   No Known Allergies     Medication List    TAKE these medications   atorvastatin 20 MG tablet Commonly known as:  LIPITOR Take 20 mg by mouth daily.   buPROPion 300 MG 24 hr tablet Commonly known as:  WELLBUTRIN  XL Take 300 mg daily by mouth.   gabapentin 300 MG capsule Commonly known as:  NEURONTIN Take 300 mg by mouth daily.   insulin glargine 100 UNIT/ML injection Commonly known as:  LANTUS Inject 80 Units into the skin every morning.   lisinopril 5 MG tablet Commonly known as:  PRINIVIL,ZESTRIL Take 5 mg by mouth daily.   metoCLOPramide 10 MG tablet Commonly known as:  REGLAN Take 1 tablet (10 mg total) by mouth 3 (three) times daily as needed for nausea  or vomiting.   NOVOLOG FLEXPEN 100 UNIT/ML FlexPen Generic drug:  insulin aspart Inject 14-20 Units into the skin 3 (three) times daily with meals.   oxyCODONE 5 MG immediate release tablet Commonly known as:  ROXICODONE Take 1 tablet (5 mg total) by mouth every 6 (six) hours as needed for severe pain.   oxyCODONE 5 MG immediate release tablet Commonly known as:  ROXICODONE Take 1 tablet (5 mg total) every 6 (six) hours as needed by mouth.   pantoprazole 40 MG tablet Commonly known as:  PROTONIX Take 40 mg by mouth daily.   sildenafil 20 MG tablet Commonly known as:  REVATIO 1-5 pills as needed for ED symptoms What changed:    how much to take  how to take this  when to take this  reasons to take this  additional instructions   sulfamethoxazole-trimethoprim 800-160 MG tablet Commonly known as:  BACTRIM DS Take 1 tablet 2 (two) times daily by mouth.   tiZANidine 4 MG tablet Commonly known as:  ZANAFLEX Take 1 tablet (4 mg total) by mouth every 6 (six) hours as needed for muscle spasms.      No Known Allergies    The results of significant diagnostics from this hospitalization (including imaging, microbiology, ancillary and laboratory) are listed below for reference.    Significant Diagnostic Studies: Ct Abdomen Pelvis W Contrast  Result Date: 10/20/2017 CLINICAL DATA:  41 year old male with chronic abdominal pain and nausea. EXAM: CT ABDOMEN AND PELVIS WITH CONTRAST TECHNIQUE: Multidetector CT imaging of the abdomen and pelvis was performed using the standard protocol following bolus administration of intravenous contrast. CONTRAST:  100mL ISOVUE-300 IOPAMIDOL (ISOVUE-300) INJECTION 61% COMPARISON:  08/19/2017 CT and ultrasound FINDINGS: Lower chest: No acute abnormality. Hepatobiliary: The liver and gallbladder are unremarkable. No biliary dilatation. Pancreas: Unremarkable Spleen: Unremarkable Adrenals/Urinary Tract: The kidneys, adrenal glands and bladder are  unremarkable. Stomach/Bowel: Stomach is within normal limits. No evidence of bowel wall thickening, distention, or inflammatory changes. Vascular/Lymphatic: No significant vascular findings are present. No enlarged abdominal or pelvic lymph nodes. Reproductive: Prostate is unremarkable. Other: No abdominal wall hernia or abnormality. No abdominopelvic ascites. Musculoskeletal: No acute abnormalities or suspicious bony lesions. 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 IMPRESSION: 1. No evidence of acute abnormality or findings to suggest a cause for this patient's abdominal pain. 2. Unchanged calcified right paracentral disc protrusion at L5-S1. Electronically Signed   By: Harmon PierJeffrey  Hu M.D.   On: 10/20/2017 16:34    Microbiology: No results found for this or any previous visit (from the past 240 hour(s)).   Labs: Basic Metabolic Panel: Recent Labs  Lab 10/20/17 1008 10/21/17 2054 10/21/17 2115 10/22/17 0445  NA 136 137  --  136  K 3.1* 3.0*  --  2.9*  CL 99* 102  --  104  CO2 25 26  --  26  GLUCOSE 251* 238*  --  122*  BUN 10 9  --  8  CREATININE  0.81 0.74  --  0.70  CALCIUM 9.4 9.5  --  8.8*  MG  --   --  1.9  --   PHOS  --   --  2.7  --    Liver Function Tests: Recent Labs  Lab 10/20/17 1008 10/21/17 2054  AST 24 20  ALT 17 15*  ALKPHOS 131* 119  BILITOT 0.5 1.3*  PROT 7.9 7.5  ALBUMIN 4.1 4.0   Recent Labs  Lab 10/20/17 1008 10/21/17 2054  LIPASE 26 24   No results for input(s): AMMONIA in the last 168 hours. CBC: Recent Labs  Lab 10/20/17 1008 10/21/17 2054 10/22/17 0445  WBC 9.1 8.5 9.5  HGB 14.1 12.9* 11.4*  HCT 41.2 36.9* 32.8*  MCV 85.7 84.1 84.3  PLT 245 232 205   Cardiac Enzymes: Recent Labs  Lab 10/20/17 1008  TROPONINI <0.03   BNP: BNP (last 3 results) No results for input(s): BNP in the last 8760 hours.  ProBNP (last 3 results) No results for input(s): PROBNP in the last 8760 hours.  CBG: Recent Labs  Lab  10/21/17 2217 10/22/17 0007 10/22/17 0435 10/22/17 0755 10/22/17 1215  GLUCAP 193* 141* 116* 137* 148*       Signed:  Darlin Drop, MD [facility] 10/22/2017, 2:40 PM

## 2017-10-22 NOTE — Consult Note (Signed)
Richard Mendoza HPI: This is a 41 year old male who was evaluated in the office yesterday for complaints of nausea, vomiting, and epigastric pain.  He was in the ER the other night and he responded to IV fluids as well as a dose of Reglan.  This allowed him to go home, but then he started to have a recurrence of his symptoms.  It was not responsive to a double dosing of Reglan and there was no benefit with a phenergan suppository.  He has a long history of DM, diagnosed in 2005 and admittedly he has been noncompliant with control until this year.  Unfortunately he has had multiple ER visits for the above symptoms.  In the past he was evaluated by Dr. Loreta AveMann with an EGD/Colonoscopy with negative findings.  At that time the procedures were performed for his complaints of diarrhea and weight loss.  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  A formal GES was not able to be performed as a result of his nausea and vomiting.  Emperic treatment was applied with Reglan during his ER visit.  Today he feels much better and he was able to tolerate water with his oral medications without Reglan.  His blood sugar is much better controlled today.  Past Medical History:  Diagnosis Date  . DIABETES MELLITUS, TYPE II, UNCONTROLLED 03/17/2009  . DM 12/08/2008  . HYPERLIPIDEMIA 03/17/2009  . HYPERTENSION 12/08/2008  . Hypertension   . OBSTRUCTIVE SLEEP APNEA 12/08/2008  . 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 ANTERIOR CERVICAL DECOMP/DISCECTOMY FUSION N/A 09/09/2017   Procedure: ANTERIOR CERVICAL DECOMPRESSION/DISCECTOMY FUSION CERVICAL 6- CERVICAL 7;  Surgeon: Coletta Memosabbell, Kyle, MD;  Location: [facility] OR;  Service: Neurosurgery;  Laterality: N/A;  ANTERIOR CERVICAL DECOMPRESSION/DISCECTOMY FUSION CERVICAL 6- CERVICAL 7  . APPENDECTOMY    . I&D EXTREMITY Right 10/03/2017   Procedure: IRRIGATION AND DEBRIDEMENT RIGHT WRIST;  Surgeon: Betha LoaKuzma, Kevin, MD;  Location: [facility] OR;  Service: Orthopedics;   Laterality: Right;  . ROTATOR CUFF REPAIR Left     Family History  Problem Relation Age of Onset  . Diabetes Mother   . Heart disease Father   . Diabetes Father   . Arthritis Other   . Hyperlipidemia Other   . Hypertension Other   . Cancer Other        breast    Social History:  reports that he has been smoking cigarettes.  He has a 17.00 pack-year smoking history. he has never used smokeless tobacco. He reports that he does not drink alcohol or use drugs.  Allergies: No Known Allergies  Medications:  Scheduled: . atorvastatin  20 mg Oral q1800  . buPROPion  300 mg Oral Daily  . docusate sodium  100 mg Oral BID  . enoxaparin (LOVENOX) injection  40 mg Subcutaneous Q24H  . famotidine  20 mg Oral BID  . gabapentin  300 mg Oral Daily  . insulin aspart  0-20 Units Subcutaneous Q4H  . insulin glargine  40 Units Subcutaneous Daily  . lisinopril  5 mg Oral Daily  . pantoprazole  40 mg Oral BID  . senna  1 tablet Oral BID   Continuous:   Results for orders placed or performed during the hospital encounter of 10/21/17 (from the past 24 hour(s))  CBG monitoring, ED     Status: Abnormal   Collection Time: 10/21/17  6:10 PM  Result Value Ref Range   Glucose-Capillary 168 (H) 65 - 99 mg/dL  Lipase,  blood     Status: None   Collection Time: 10/21/17  8:54 PM  Result Value Ref Range   Lipase 24 11 - 51 U/L  Comprehensive metabolic panel     Status: Abnormal   Collection Time: 10/21/17  8:54 PM  Result Value Ref Range   Sodium 137 135 - 145 mmol/L   Potassium 3.0 (L) 3.5 - 5.1 mmol/L   Chloride 102 101 - 111 mmol/L   CO2 26 22 - 32 mmol/L   Glucose, Bld 238 (H) 65 - 99 mg/dL   BUN 9 6 - 20 mg/dL   Creatinine, Ser 1.61 0.61 - 1.24 mg/dL   Calcium 9.5 8.9 - 09.6 mg/dL   Total Protein 7.5 6.5 - 8.1 g/dL   Albumin 4.0 3.5 - 5.0 g/dL   AST 20 15 - 41 U/L   ALT 15 (L) 17 - 63 U/L   Alkaline Phosphatase 119 38 - 126 U/L   Total Bilirubin 1.3 (H) 0.3 - 1.2 mg/dL   GFR calc non  Af Amer >60 >60 mL/min   GFR calc Af Amer >60 >60 mL/min   Anion gap 9 5 - 15  CBC     Status: Abnormal   Collection Time: 10/21/17  8:54 PM  Result Value Ref Range   WBC 8.5 4.0 - 10.5 K/uL   RBC 4.39 4.22 - 5.81 MIL/uL   Hemoglobin 12.9 (L) 13.0 - 17.0 g/dL   HCT 04.5 (L) 40.9 - 81.1 %   MCV 84.1 78.0 - 100.0 fL   MCH 29.4 26.0 - 34.0 pg   MCHC 35.0 30.0 - 36.0 g/dL   RDW 91.4 78.2 - 95.6 %   Platelets 232 150 - 400 K/uL  Urinalysis, Routine w reflex microscopic     Status: Abnormal   Collection Time: 10/21/17  9:00 PM  Result Value Ref Range   Color, Urine YELLOW YELLOW   APPearance CLEAR CLEAR   Specific Gravity, Urine 1.008 1.005 - 1.030   pH 7.0 5.0 - 8.0   Glucose, UA >=500 (A) NEGATIVE mg/dL   Hgb urine dipstick SMALL (A) NEGATIVE   Bilirubin Urine NEGATIVE NEGATIVE   Ketones, ur 5 (A) NEGATIVE mg/dL   Protein, ur NEGATIVE NEGATIVE mg/dL   Nitrite NEGATIVE NEGATIVE   Leukocytes, UA NEGATIVE NEGATIVE   RBC / HPF 0-5 0 - 5 RBC/hpf   WBC, UA 0-5 0 - 5 WBC/hpf   Bacteria, UA NONE SEEN NONE SEEN   Squamous Epithelial / LPF 0-5 (A) NONE SEEN   Mucus PRESENT   Magnesium     Status: None   Collection Time: 10/21/17  9:15 PM  Result Value Ref Range   Magnesium 1.9 1.7 - 2.4 mg/dL  Phosphorus     Status: None   Collection Time: 10/21/17  9:15 PM  Result Value Ref Range   Phosphorus 2.7 2.5 - 4.6 mg/dL  CBG monitoring, ED     Status: Abnormal   Collection Time: 10/21/17 10:17 PM  Result Value Ref Range   Glucose-Capillary 193 (H) 65 - 99 mg/dL  CBG monitoring, ED     Status: Abnormal   Collection Time: 10/22/17 12:07 AM  Result Value Ref Range   Glucose-Capillary 141 (H) 65 - 99 mg/dL  CBG monitoring, ED     Status: Abnormal   Collection Time: 10/22/17  4:35 AM  Result Value Ref Range   Glucose-Capillary 116 (H) 65 - 99 mg/dL  Basic metabolic panel     Status: Abnormal  Collection Time: 10/22/17  4:45 AM  Result Value Ref Range   Sodium 136 135 - 145 mmol/L    Potassium 2.9 (L) 3.5 - 5.1 mmol/L   Chloride 104 101 - 111 mmol/L   CO2 26 22 - 32 mmol/L   Glucose, Bld 122 (H) 65 - 99 mg/dL   BUN 8 6 - 20 mg/dL   Creatinine, Ser 1.61 0.61 - 1.24 mg/dL   Calcium 8.8 (L) 8.9 - 10.3 mg/dL   GFR calc non Af Amer >60 >60 mL/min   GFR calc Af Amer >60 >60 mL/min   Anion gap 6 5 - 15  CBC     Status: Abnormal   Collection Time: 10/22/17  4:45 AM  Result Value Ref Range   WBC 9.5 4.0 - 10.5 K/uL   RBC 3.89 (L) 4.22 - 5.81 MIL/uL   Hemoglobin 11.4 (L) 13.0 - 17.0 g/dL   HCT 09.6 (L) 04.5 - 40.9 %   MCV 84.3 78.0 - 100.0 fL   MCH 29.3 26.0 - 34.0 pg   MCHC 34.8 30.0 - 36.0 g/dL   RDW 81.1 91.4 - 78.2 %   Platelets 205 150 - 400 K/uL  CBG monitoring, ED     Status: Abnormal   Collection Time: 10/22/17  7:55 AM  Result Value Ref Range   Glucose-Capillary 137 (H) 65 - 99 mg/dL  Glucose, capillary     Status: Abnormal   Collection Time: 10/22/17 12:15 PM  Result Value Ref Range   Glucose-Capillary 148 (H) 65 - 99 mg/dL     Ct Abdomen Pelvis W Contrast  Result Date: 10/20/2017 CLINICAL DATA:  41 year old male with chronic abdominal pain and nausea. EXAM: CT ABDOMEN AND PELVIS WITH CONTRAST TECHNIQUE: Multidetector CT imaging of the abdomen and pelvis was performed using the standard protocol following bolus administration of intravenous contrast. CONTRAST:  ISOVUE-300 IOPAMIDOL (ISOVUE-300) INJECTION 61% COMPARISON:  08/19/2017 CT and ultrasound FINDINGS: Lower chest: No acute abnormality. Hepatobiliary: The liver and gallbladder are unremarkable. No biliary dilatation. Pancreas: Unremarkable Spleen: Unremarkable Adrenals/Urinary Tract: The kidneys, adrenal glands and bladder are unremarkable. Stomach/Bowel: Stomach is within normal limits. No evidence of bowel wall thickening, distention, or inflammatory changes. Vascular/Lymphatic: No significant vascular findings are present. No enlarged abdominal or pelvic lymph nodes. Reproductive: Prostate is  unremarkable. Other: No abdominal wall hernia or abnormality. No abdominopelvic ascites. Musculoskeletal: No acute abnormalities or suspicious bony lesions. 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 IMPRESSION: 1. No evidence of acute abnormality or findings to suggest a cause for this patient's abdominal pain. 2. Unchanged calcified right paracentral disc protrusion at L5-S1. Electronically Signed   By: Harmon Pier M.D.   On: 10/20/2017 16:34    ROS:  As stated above in the HPI otherwise negative.  Blood pressure (!) 152/97, pulse 92, temperature 98 F (36.7 C), temperature source Oral, resp. rate 18, SpO2 99 %.    PE: Gen: NAD, Alert and Oriented HEENT:  [place]/AT, EOMI Neck: Supple, no LAD Lungs: CTA Bilaterally CV: RRR without M/G/R ABM: Soft, NTND, +BS Ext: No C/C/E  Assessment/Plan: 1) Nausea/Vomiting - Improved. 2) Dehydration - Improved/resolved. 3) DM - much better controlled.   Clinically he is 100% improved compared to the office visit yesterday without the use of Reglan.  I am pleased to see the improvement.  Hopefully he can undergo a GES, but he needs to be off of pain medications as this will result in a false positive.  It is not  a rush at this time as he is much better.  Gastroparesis is still a consideration, but gastroparesis can be an acute situation as a result of elevated blood sugar.  He is under very good control at this time.  He states that he has some residual epigastric discomfort.  His EGD in 2015 was normal and I will hold off on an EGD since he has improved.  Plan: 1) Continue with supportive care. 2) Avoid Reglan if possible without a formal diagnosis of gastroparesis. 3) Advance to a clear liquid diet.   4) Continued tight control of his DM.  Ronell Boldin D 10/22/2017, 12:28 PM

## 2017-10-22 NOTE — Progress Notes (Signed)
Patient is being discharged home. Discharge instructions were given to patient and family. Patient denied using wheelchair for transportation

## 2017-11-17 ENCOUNTER — Emergency Department (HOSPITAL_COMMUNITY)
Admission: EM | Admit: 2017-11-17 | Discharge: 2017-11-17 | Disposition: A | Discharge status: Home / Self Care | Payer: Medicaid Other | Attending: Emergency Medicine | Admitting: Emergency Medicine

## 2017-11-17 ENCOUNTER — Encounter (HOSPITAL_COMMUNITY): Payer: Self-pay | Admitting: Emergency Medicine

## 2017-11-17 DIAGNOSIS — Z794 Long term (current) use of insulin: Secondary | ICD-10-CM | POA: Insufficient documentation

## 2017-11-17 DIAGNOSIS — I10 Essential (primary) hypertension: Secondary | ICD-10-CM | POA: Diagnosis not present

## 2017-11-17 DIAGNOSIS — R1084 Generalized abdominal pain: Secondary | ICD-10-CM | POA: Insufficient documentation

## 2017-11-17 DIAGNOSIS — F17210 Nicotine dependence, cigarettes, uncomplicated: Secondary | ICD-10-CM | POA: Insufficient documentation

## 2017-11-17 DIAGNOSIS — Z79899 Other long term (current) drug therapy: Secondary | ICD-10-CM | POA: Diagnosis not present

## 2017-11-17 DIAGNOSIS — R1110 Vomiting, unspecified: Secondary | ICD-10-CM

## 2017-11-17 DIAGNOSIS — E119 Type 2 diabetes mellitus without complications: Secondary | ICD-10-CM | POA: Diagnosis not present

## 2017-11-17 LAB — CBC
HEMATOCRIT: 41.5 % (ref 39.0–52.0)
Hemoglobin: 14.3 g/dL (ref 13.0–17.0)
MCH: 29.2 pg (ref 26.0–34.0)
MCHC: 34.5 g/dL (ref 30.0–36.0)
MCV: 84.7 fL (ref 78.0–100.0)
Platelets: 259 10*3/uL (ref 150–400)
RBC: 4.9 MIL/uL (ref 4.22–5.81)
RDW: 13.2 % (ref 11.5–15.5)
WBC: 8.6 10*3/uL (ref 4.0–10.5)

## 2017-11-17 LAB — CBG MONITORING, ED: GLUCOSE-CAPILLARY: 224 mg/dL — AB (ref 65–99)

## 2017-11-17 LAB — COMPREHENSIVE METABOLIC PANEL
ALBUMIN: 4.4 g/dL (ref 3.5–5.0)
ALT: 15 U/L — ABNORMAL LOW (ref 17–63)
AST: 21 U/L (ref 15–41)
Alkaline Phosphatase: 135 U/L — ABNORMAL HIGH (ref 38–126)
Anion gap: 8 (ref 5–15)
BUN: 17 mg/dL (ref 6–20)
CHLORIDE: 102 mmol/L (ref 101–111)
CO2: 26 mmol/L (ref 22–32)
Calcium: 9.7 mg/dL (ref 8.9–10.3)
Creatinine, Ser: 0.86 mg/dL (ref 0.61–1.24)
GFR calc Af Amer: >60 mL/min (ref >60)
Glucose, Bld: 240 mg/dL — ABNORMAL HIGH (ref 65–99)
POTASSIUM: 3.5 mmol/L (ref 3.5–5.1)
Sodium: 136 mmol/L (ref 135–145)
Total Bilirubin: 0.8 mg/dL (ref 0.3–1.2)
Total Protein: 8.3 g/dL — ABNORMAL HIGH (ref 6.5–8.1)

## 2017-11-17 LAB — URINALYSIS, ROUTINE W REFLEX MICROSCOPIC
BACTERIA UA: NONE SEEN
BILIRUBIN URINE: NEGATIVE
Glucose, UA: >=500 mg/dL — AB
KETONES UR: 5 mg/dL — AB
LEUKOCYTES UA: NEGATIVE
Nitrite: NEGATIVE
PROTEIN: NEGATIVE mg/dL
SPECIFIC GRAVITY, URINE: 1.028 (ref 1.005–1.030)
pH: 5 (ref 5.0–8.0)

## 2017-11-17 LAB — LIPASE, BLOOD: LIPASE: 23 U/L (ref 11–51)

## 2017-11-17 MED ORDER — SODIUM CHLORIDE 0.9 % IV SOLN
1000.0000 mL | INTRAVENOUS | Status: DC
  Administered 2017-11-17: 1000 mL via INTRAVENOUS

## 2017-11-17 MED ORDER — METOCLOPRAMIDE HCL 10 MG PO TABS: 10.0000 mg | ORAL_TABLET | Freq: Four times a day (QID) | ORAL | 0 refills | Status: DC

## 2017-11-17 MED ORDER — SODIUM CHLORIDE 0.9 % IV BOLUS (SEPSIS)
1000.0000 mL | Freq: Once | INTRAVENOUS | Status: AC
  Administered 2017-11-17: 1000 mL via INTRAVENOUS

## 2017-11-17 MED ORDER — METOCLOPRAMIDE HCL 5 MG/ML IJ SOLN
10.0000 mg | Freq: Once | INTRAMUSCULAR | Status: AC
  Administered 2017-11-17: 10 mg via INTRAVENOUS
  Filled 2017-11-17: qty 2

## 2017-11-17 NOTE — ED Triage Notes (Addendum)
Patient c/o abdominal cramping with emesis since Wednesday. Reports calling GI doctor since recent admission for gastroparesis and was referred here for IV mediation. Taking Zofran at home with no relief. Reports recently getting pump to control diabetes, significant other states blood sugars in the mid 200s yesterday and today.

## 2017-11-17 NOTE — ED Provider Notes (Signed)
[facility]-EMERGENCY DEPT Provider Note   CSN: [id number] Arrival date & time: 11/17/17  1415     History   Chief Complaint Chief Complaint  Patient presents with  . Abdominal Pain  . Emesis    HPI Richard Mendoza is a 41 y.o. male.  The history is provided by the patient. No language interpreter was used.  Abdominal Pain   This is a recurrent problem. The current episode started 12 to 24 hours ago. The problem occurs constantly. The problem has been gradually worsening. The pain is associated with an unknown factor. The pain is located in the generalized abdominal region. The pain is moderate. Associated symptoms include vomiting. Nothing aggravates the symptoms. Nothing relieves the symptoms. Past workup includes GI consult.  Emesis   Associated symptoms include abdominal pain.  Pt reports he has vomiting due to his diabetes.  Pt has been seen by Gi.  Pt reports no relief with zofran.    Past Medical History:  Diagnosis Date  . DIABETES MELLITUS, TYPE II, UNCONTROLLED 03/17/2009  . DM 12/08/2008  . HYPERLIPIDEMIA 03/17/2009  . HYPERTENSION 12/08/2008  . Hypertension   . OBSTRUCTIVE SLEEP APNEA 12/08/2008  . 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 Intractable nausea and vomiting 10/21/2017  . HNP (herniated nucleus pulposus), cervical 09/09/2017  . Routine general medical examination at a health care facility 01/20/2014  . 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 HYPERLIPIDEMIA 03/17/2009  . DM 12/08/2008  . OBSTRUCTIVE SLEEP APNEA 12/08/2008  . Essential hypertension 12/08/2008    Past Surgical History:  Procedure Laterality Date  . ANTERIOR CERVICAL DECOMP/DISCECTOMY FUSION N/A 09/09/2017   Procedure: ANTERIOR CERVICAL DECOMPRESSION/DISCECTOMY FUSION CERVICAL 6- CERVICAL 7;  Surgeon: Coletta Memosabbell, Kyle, MD;  Location: [facility] OR;  Service: Neurosurgery;   Laterality: N/A;  ANTERIOR CERVICAL DECOMPRESSION/DISCECTOMY FUSION CERVICAL 6- CERVICAL 7  . APPENDECTOMY    . I&D EXTREMITY Right 10/03/2017   Procedure: IRRIGATION AND DEBRIDEMENT RIGHT WRIST;  Surgeon: Betha LoaKuzma, Kevin, MD;  Location: [facility] OR;  Service: Orthopedics;  Laterality: Right;  . ROTATOR CUFF REPAIR Left        Home Medications    Prior to Admission medications   Medication Sig Start Date End Date Taking? Authorizing Provider  atorvastatin (LIPITOR) 20 MG tablet Take 20 mg by mouth daily. 08/23/17  Yes [provider]  [employer] SENNA 8.6 MG tablet Take 2 tablets by mouth 2 (two) times daily. 10/31/17  Yes [provider]  dimenhyDRINATE (DRAMAMINE) 50 MG tablet Take 50 mg by mouth 2 (two) times daily.   Yes [provider]  famotidine (PEPCID) 20 MG tablet Take 20 mg by mouth 2 (two) times daily.   Yes [provider]  gabapentin (NEURONTIN) 300 MG capsule Take 300 mg by mouth daily. 10/11/15  Yes [provider]  Insulin Human (INSULIN PUMP) SOLN Inject 2.5 each into the skin every hour.   Yes [provider]  linaclotide (LINZESS) 145 MCG CAPS capsule Take 145 mcg by mouth daily before breakfast.   Yes [provider]  lisinopril (PRINIVIL,ZESTRIL) 5 MG tablet Take 5 mg by mouth daily. 10/14/17  Yes [provider]  oxyCODONE (ROXICODONE) 5 MG immediate release tablet Take 1 tablet (5 mg total) by mouth every 6 (six) hours as needed for severe pain. 09/10/17  Yes Coletta Memosabbell, Kyle, MD  pantoprazole (PROTONIX) 40 MG tablet Take 40  mg by mouth daily. 08/23/17  Yes [provider]  ranitidine (ZANTAC) 150 MG tablet Take 150 mg by mouth 2 (two) times daily.   Yes [provider]  sertraline (ZOLOFT) 50 MG tablet Take 50 mg by mouth daily. 10/31/17  Yes [provider]  sildenafil (REVATIO) 20 MG tablet 1-5 pills as needed for ED symptoms Patient taking differently: Take 20-100 mg by mouth daily as  needed (ED).  09/26/15  Yes Romero Belling, MD  tiZANidine (ZANAFLEX) 4 MG tablet Take 1 tablet (4 mg total) by mouth every 6 (six) hours as needed for muscle spasms. 09/10/17  Yes Coletta Memos, MD  metoCLOPramide (REGLAN) 10 MG tablet Take 1 tablet (10 mg total) by mouth every 6 (six) hours. 11/17/17   Elson Areas, PA-C  oxyCODONE (ROXICODONE) 5 MG immediate release tablet Take 1 tablet (5 mg total) every 6 (six) hours as needed by mouth. 10/03/17 10/03/18  Betha Loa, MD  sulfamethoxazole-trimethoprim (BACTRIM DS) 800-160 MG tablet Take 1 tablet 2 (two) times daily by mouth. Patient not taking: Reported on 10/21/2017 10/03/17   Betha Loa, MD    Family History Family History  Problem Relation Age of Onset  . Diabetes Mother   . Heart disease Father   . Diabetes Father   . Arthritis Other   . Hyperlipidemia Other   . Hypertension Other   . Cancer Other        breast    Social History Social History   Tobacco Use  . Smoking status: Current Every Day Smoker    Packs/day: 1.00    Years: 17.00    Pack years: 17.00    Types: Cigarettes  . Smokeless tobacco: Never Used  . Tobacco comment: currently smoking 1 ppd.    Substance Use Topics  . Alcohol use: No  . Drug use: No     Allergies   Patient has no known allergies.   Review of Systems Review of Systems  Gastrointestinal: Positive for abdominal pain and vomiting.  All other systems reviewed and are negative.    Physical Exam Updated Vital Signs BP 126/77 (BP Location: Right Arm)   Pulse 74   Temp 98.6 F (37 C) (Oral)   Resp 18   Ht 5\' 10"  (1.778 m)   Wt 86.2 kg (190 lb)   SpO2 97%   BMI 27.26 kg/m   Physical Exam  Constitutional: He appears well-developed and well-nourished.  HENT:  Head: Normocephalic and atraumatic.  Eyes: Conjunctivae are normal.  Neck: Neck supple.  Cardiovascular: Normal rate and regular rhythm.  No murmur heard. Pulmonary/Chest: Effort normal and breath sounds normal. No  respiratory distress.  Abdominal: Soft. Bowel sounds are normal. There is no tenderness.  Musculoskeletal: He exhibits no edema.  Neurological: He is alert.  Skin: Skin is warm and dry.  Psychiatric: He has a normal mood and affect.  Nursing note and vitals reviewed.    ED Treatments / Results  Labs (all labs ordered are listed, but only abnormal results are displayed) Labs Reviewed  COMPREHENSIVE METABOLIC PANEL - Abnormal; Notable for the following components:      Result Value   Glucose, Bld 240 (*)    Total Protein 8.3 (*)    ALT 15 (*)    Alkaline Phosphatase 135 (*)    All other components within normal limits  URINALYSIS, ROUTINE W REFLEX MICROSCOPIC - Abnormal; Notable for the following components:   Glucose, UA >=500 (*)    Hgb urine dipstick  MODERATE (*)    Ketones, ur 5 (*)    Squamous Epithelial / LPF 0-5 (*)    All other components within normal limits  CBG MONITORING, ED - Abnormal; Notable for the following components:   Glucose-Capillary 224 (*)    All other components within normal limits  LIPASE, BLOOD  CBC    EKG  EKG Interpretation None       Radiology No results found.  Procedures Procedures (including critical care time)  Medications Ordered in ED Medications  sodium chloride 0.9 % bolus 1,000 mL (0 mLs Intravenous Stopped 11/17/17 2021)    Followed by  sodium chloride 0.9 % bolus 1,000 mL (0 mLs Intravenous Stopped 11/17/17 2021)    Followed by  0.9 %  sodium chloride infusion (0 mLs Intravenous Stopped 11/17/17 2156)  metoCLOPramide (REGLAN) injection 10 mg (10 mg Intravenous Given 11/17/17 1848)     Initial Impression / Assessment and Plan / ED Course  I have reviewed the triage vital signs and the nursing notes.  Pertinent labs & imaging results that were available during my care of the patient were reviewed by me and considered in my medical decision making (see chart for details).     Pt given Iv fluids and reglan.  Pt  reports he feels better.  Pt advised to follow up with Gi.  Pt given rx for reglan.   Final Clinical Impressions(s) / ED Diagnoses   Final diagnoses:  Hyperemesis    ED Discharge Orders        Ordered    metoCLOPramide (REGLAN) 10 MG tablet  Every 6 hours     11/17/17 2133    An After Visit Summary was printed and given to the patient.    Elson AreasSofia, Rogen Porte K, [place] JerseyPA-C 11/17/17 2334    Rolan BuccoBelfi, Melanie, MD 11/17/17 [phone]

## 2017-11-17 NOTE — ED Notes (Signed)
PCO2 of 83 given to dr. Clayborne DanaMesner.

## 2017-11-17 NOTE — Discharge Instructions (Signed)
See your Physician for recheck as scheduled  

## 2018-06-16 DIAGNOSIS — E1142 Type 2 diabetes mellitus with diabetic polyneuropathy: Secondary | ICD-10-CM | POA: Insufficient documentation

## 2018-06-16 DIAGNOSIS — E1169 Type 2 diabetes mellitus with other specified complication: Secondary | ICD-10-CM | POA: Insufficient documentation

## 2018-06-16 DIAGNOSIS — E785 Hyperlipidemia, unspecified: Secondary | ICD-10-CM | POA: Insufficient documentation

## 2018-11-25 ENCOUNTER — Inpatient Hospital Stay
Admission: AD | Admit: 2018-11-25 | Discharge: 2018-11-29 | DRG: 580 | Disposition: A | Discharge status: Home Health | Payer: Medicaid Other | Source: Ambulatory Visit | Attending: Internal Medicine | Admitting: Internal Medicine

## 2018-11-25 ENCOUNTER — Other Ambulatory Visit: Payer: Self-pay

## 2018-11-25 ENCOUNTER — Inpatient Hospital Stay: Payer: Medicaid Other

## 2018-11-25 DIAGNOSIS — F329 Major depressive disorder, single episode, unspecified: Secondary | ICD-10-CM | POA: Diagnosis present

## 2018-11-25 DIAGNOSIS — F17210 Nicotine dependence, cigarettes, uncomplicated: Secondary | ICD-10-CM | POA: Diagnosis present

## 2018-11-25 DIAGNOSIS — L02611 Cutaneous abscess of right foot: Principal | ICD-10-CM | POA: Diagnosis present

## 2018-11-25 DIAGNOSIS — E1042 Type 1 diabetes mellitus with diabetic polyneuropathy: Secondary | ICD-10-CM | POA: Diagnosis present

## 2018-11-25 DIAGNOSIS — F339 Major depressive disorder, recurrent, unspecified: Secondary | ICD-10-CM | POA: Diagnosis not present

## 2018-11-25 DIAGNOSIS — Z8614 Personal history of Methicillin resistant Staphylococcus aureus infection: Secondary | ICD-10-CM | POA: Diagnosis not present

## 2018-11-25 DIAGNOSIS — E785 Hyperlipidemia, unspecified: Secondary | ICD-10-CM | POA: Diagnosis present

## 2018-11-25 DIAGNOSIS — E1065 Type 1 diabetes mellitus with hyperglycemia: Secondary | ICD-10-CM | POA: Diagnosis present

## 2018-11-25 DIAGNOSIS — E1069 Type 1 diabetes mellitus with other specified complication: Secondary | ICD-10-CM | POA: Diagnosis present

## 2018-11-25 DIAGNOSIS — F17200 Nicotine dependence, unspecified, uncomplicated: Secondary | ICD-10-CM | POA: Diagnosis not present

## 2018-11-25 DIAGNOSIS — L0291 Cutaneous abscess, unspecified: Secondary | ICD-10-CM

## 2018-11-25 DIAGNOSIS — I10 Essential (primary) hypertension: Secondary | ICD-10-CM | POA: Diagnosis present

## 2018-11-25 DIAGNOSIS — Z981 Arthrodesis status: Secondary | ICD-10-CM | POA: Diagnosis not present

## 2018-11-25 DIAGNOSIS — Z79899 Other long term (current) drug therapy: Secondary | ICD-10-CM

## 2018-11-25 DIAGNOSIS — Z872 Personal history of diseases of the skin and subcutaneous tissue: Secondary | ICD-10-CM | POA: Diagnosis not present

## 2018-11-25 DIAGNOSIS — E10621 Type 1 diabetes mellitus with foot ulcer: Secondary | ICD-10-CM | POA: Diagnosis present

## 2018-11-25 DIAGNOSIS — G4730 Sleep apnea, unspecified: Secondary | ICD-10-CM | POA: Diagnosis present

## 2018-11-25 DIAGNOSIS — L97519 Non-pressure chronic ulcer of other part of right foot with unspecified severity: Secondary | ICD-10-CM | POA: Diagnosis present

## 2018-11-25 DIAGNOSIS — Z794 Long term (current) use of insulin: Secondary | ICD-10-CM | POA: Diagnosis not present

## 2018-11-25 DIAGNOSIS — E11628 Type 2 diabetes mellitus with other skin complications: Secondary | ICD-10-CM | POA: Diagnosis not present

## 2018-11-25 DIAGNOSIS — L03115 Cellulitis of right lower limb: Secondary | ICD-10-CM | POA: Diagnosis present

## 2018-11-25 DIAGNOSIS — B9561 Methicillin susceptible Staphylococcus aureus infection as the cause of diseases classified elsewhere: Secondary | ICD-10-CM | POA: Diagnosis present

## 2018-11-25 DIAGNOSIS — E876 Hypokalemia: Secondary | ICD-10-CM | POA: Diagnosis present

## 2018-11-25 DIAGNOSIS — L84 Corns and callosities: Secondary | ICD-10-CM | POA: Diagnosis not present

## 2018-11-25 DIAGNOSIS — R358 Other polyuria: Secondary | ICD-10-CM | POA: Diagnosis not present

## 2018-11-25 DIAGNOSIS — L089 Local infection of the skin and subcutaneous tissue, unspecified: Secondary | ICD-10-CM | POA: Diagnosis not present

## 2018-11-25 LAB — COMPREHENSIVE METABOLIC PANEL
ALT: 13 U/L (ref 0–44)
AST: 16 U/L (ref 15–41)
Albumin: 3.4 g/dL — ABNORMAL LOW (ref 3.5–5.0)
Alkaline Phosphatase: 117 U/L (ref 38–126)
Anion gap: 9 (ref 5–15)
BUN: 14 mg/dL (ref 6–20)
CO2: 22 mmol/L (ref 22–32)
CREATININE: 1.15 mg/dL (ref 0.61–1.24)
Calcium: 9 mg/dL (ref 8.9–10.3)
Chloride: 103 mmol/L (ref 98–111)
GFR calc Af Amer: >60 mL/min (ref >60)
GFR calc non Af Amer: >60 mL/min (ref >60)
Glucose, Bld: 274 mg/dL — ABNORMAL HIGH (ref 70–99)
Potassium: 3.1 mmol/L — ABNORMAL LOW (ref 3.5–5.1)
Sodium: 134 mmol/L — ABNORMAL LOW (ref 135–145)
TOTAL PROTEIN: 7.4 g/dL (ref 6.5–8.1)
Total Bilirubin: 0.7 mg/dL (ref 0.3–1.2)

## 2018-11-25 LAB — CBC
HCT: 35.3 % — ABNORMAL LOW (ref 39.0–52.0)
Hemoglobin: 11.7 g/dL — ABNORMAL LOW (ref 13.0–17.0)
MCH: 27.9 pg (ref 26.0–34.0)
MCHC: 33.1 g/dL (ref 30.0–36.0)
MCV: 84.2 fL (ref 80.0–100.0)
NRBC: 0 % (ref 0.0–0.2)
Platelets: 236 10*3/uL (ref 150–400)
RBC: 4.19 MIL/uL — ABNORMAL LOW (ref 4.22–5.81)
RDW: 13.8 % (ref 11.5–15.5)
WBC: 13.4 10*3/uL — AB (ref 4.0–10.5)

## 2018-11-25 LAB — GLUCOSE, CAPILLARY: Glucose-Capillary: 253 mg/dL — ABNORMAL HIGH (ref 70–99)

## 2018-11-25 MED ORDER — ACETAMINOPHEN 650 MG RE SUPP: 650.0000 mg | Freq: Four times a day (QID) | RECTAL | Status: DC | PRN

## 2018-11-25 MED ORDER — SENNA 8.6 MG PO TABS
2.0000 | ORAL_TABLET | Freq: Two times a day (BID) | ORAL | Status: DC
  Administered 2018-11-25: 17.2 mg via ORAL
  Administered 2018-11-26: 8.6 mg via ORAL
  Filled 2018-11-25 (×2): qty 2

## 2018-11-25 MED ORDER — PANTOPRAZOLE SODIUM 40 MG PO TBEC
40.0000 mg | DELAYED_RELEASE_TABLET | Freq: Every day | ORAL | Status: DC
  Administered 2018-11-26 – 2018-11-29 (×4): 40 mg via ORAL
  Filled 2018-11-25 (×4): qty 1

## 2018-11-25 MED ORDER — METOCLOPRAMIDE HCL 10 MG PO TABS
10.0000 mg | ORAL_TABLET | Freq: Four times a day (QID) | ORAL | Status: DC
  Administered 2018-11-25: 10 mg via ORAL
  Filled 2018-11-25: qty 1

## 2018-11-25 MED ORDER — LISINOPRIL 10 MG PO TABS
5.0000 mg | ORAL_TABLET | Freq: Every day | ORAL | Status: DC
  Administered 2018-11-26 – 2018-11-29 (×4): 5 mg via ORAL
  Filled 2018-11-25 (×5): qty 1

## 2018-11-25 MED ORDER — SODIUM CHLORIDE 0.9 % IV SOLN
2.0000 g | Freq: Two times a day (BID) | INTRAVENOUS | Status: DC
  Administered 2018-11-25 – 2018-11-28 (×6): 2 g via INTRAVENOUS
  Filled 2018-11-25 (×8): qty 2

## 2018-11-25 MED ORDER — GABAPENTIN 300 MG PO CAPS: 300.0000 mg | ORAL_CAPSULE | Freq: Every day | ORAL | Status: DC

## 2018-11-25 MED ORDER — TIZANIDINE HCL 4 MG PO TABS
4.0000 mg | ORAL_TABLET | Freq: Four times a day (QID) | ORAL | Status: DC | PRN
  Filled 2018-11-25: qty 1

## 2018-11-25 MED ORDER — INSULIN PUMP
SUBCUTANEOUS | Status: DC
  Administered 2018-11-25: 5.2 via SUBCUTANEOUS
  Administered 2018-11-26: 19:00:00 via SUBCUTANEOUS
  Administered 2018-11-27: 10 via SUBCUTANEOUS
  Administered 2018-11-27: 5.8 via SUBCUTANEOUS
  Administered 2018-11-27: 21:00:00 via SUBCUTANEOUS
  Administered 2018-11-27: 5.9 via SUBCUTANEOUS
  Administered 2018-11-27: 3.8 via SUBCUTANEOUS
  Administered 2018-11-27: 5.9 via SUBCUTANEOUS
  Administered 2018-11-28: 21:00:00 via SUBCUTANEOUS
  Administered 2018-11-28: 6.1 via SUBCUTANEOUS
  Administered 2018-11-28: 2.8 via SUBCUTANEOUS
  Administered 2018-11-29: 12:00:00 via SUBCUTANEOUS
  Administered 2018-11-29: 1.5 via SUBCUTANEOUS
  Filled 2018-11-25: qty 1

## 2018-11-25 MED ORDER — NICOTINE 21 MG/24HR TD PT24
21.0000 mg | MEDICATED_PATCH | Freq: Every day | TRANSDERMAL | Status: DC
Start: 2018-11-25 — End: 2018-11-30
  Administered 2018-11-25 – 2018-11-29 (×5): 21 mg via TRANSDERMAL
  Filled 2018-11-25 (×5): qty 1

## 2018-11-25 MED ORDER — MORPHINE SULFATE (PF) 2 MG/ML IV SOLN
2.0000 mg | INTRAVENOUS | Status: DC | PRN
  Administered 2018-11-26 – 2018-11-27 (×5): 2 mg via INTRAVENOUS
  Filled 2018-11-25 (×5): qty 1

## 2018-11-25 MED ORDER — OXYCODONE HCL 5 MG PO TABS: 5.0000 mg | ORAL_TABLET | Freq: Four times a day (QID) | ORAL | Status: DC | PRN

## 2018-11-25 MED ORDER — VANCOMYCIN HCL 10 G IV SOLR
2000.0000 mg | Freq: Once | INTRAVENOUS | Status: AC
  Administered 2018-11-25: 2000 mg via INTRAVENOUS
  Filled 2018-11-25 (×2): qty 2000

## 2018-11-25 MED ORDER — INSULIN ASPART 100 UNIT/ML ~~LOC~~ SOLN: 0.0000 [IU] | Freq: Every day | SUBCUTANEOUS | Status: DC

## 2018-11-25 MED ORDER — ONDANSETRON HCL 4 MG PO TABS: 4.0000 mg | ORAL_TABLET | Freq: Four times a day (QID) | ORAL | Status: DC | PRN

## 2018-11-25 MED ORDER — INSULIN ASPART 100 UNIT/ML ~~LOC~~ SOLN: 0.0000 [IU] | Freq: Three times a day (TID) | SUBCUTANEOUS | Status: DC

## 2018-11-25 MED ORDER — GABAPENTIN 300 MG PO CAPS
600.0000 mg | ORAL_CAPSULE | Freq: Every day | ORAL | Status: DC
  Administered 2018-11-25 – 2018-11-28 (×4): 600 mg via ORAL
  Filled 2018-11-25 (×4): qty 2

## 2018-11-25 MED ORDER — GADOBUTROL 1 MMOL/ML IV SOLN
9.5000 mL | Freq: Once | INTRAVENOUS | Status: AC | PRN
  Administered 2018-11-25: 9.5 mL via INTRAVENOUS

## 2018-11-25 MED ORDER — ACETAMINOPHEN 325 MG PO TABS
650.0000 mg | ORAL_TABLET | Freq: Four times a day (QID) | ORAL | Status: DC | PRN
  Administered 2018-11-26 – 2018-11-28 (×2): 650 mg via ORAL
  Filled 2018-11-25 (×2): qty 2

## 2018-11-25 MED ORDER — ONDANSETRON HCL 4 MG/2ML IJ SOLN
4.0000 mg | Freq: Four times a day (QID) | INTRAMUSCULAR | Status: DC | PRN
  Administered 2018-11-26: 4 mg via INTRAVENOUS

## 2018-11-25 MED ORDER — FAMOTIDINE 20 MG PO TABS
20.0000 mg | ORAL_TABLET | Freq: Two times a day (BID) | ORAL | Status: DC
  Administered 2018-11-25 – 2018-11-29 (×8): 20 mg via ORAL
  Filled 2018-11-25 (×8): qty 1

## 2018-11-25 MED ORDER — ENOXAPARIN SODIUM 40 MG/0.4ML ~~LOC~~ SOLN
40.0000 mg | SUBCUTANEOUS | Status: DC
  Administered 2018-11-25 – 2018-11-28 (×3): 40 mg via SUBCUTANEOUS
  Filled 2018-11-25 (×4): qty 0.4

## 2018-11-25 MED ORDER — SODIUM CHLORIDE 0.9 % IV SOLN
INTRAVENOUS | Status: DC
  Administered 2018-11-25 – 2018-11-29 (×7): via INTRAVENOUS

## 2018-11-25 MED ORDER — VANCOMYCIN HCL 10 G IV SOLR
1500.0000 mg | Freq: Two times a day (BID) | INTRAVENOUS | Status: DC
  Administered 2018-11-26 – 2018-11-29 (×7): 1500 mg via INTRAVENOUS
  Filled 2018-11-25 (×11): qty 1500

## 2018-11-25 MED ORDER — ATORVASTATIN CALCIUM 20 MG PO TABS
20.0000 mg | ORAL_TABLET | Freq: Every day | ORAL | Status: DC
  Administered 2018-11-26 – 2018-11-29 (×4): 20 mg via ORAL
  Filled 2018-11-25 (×5): qty 1

## 2018-11-25 MED ORDER — DIMENHYDRINATE 50 MG PO TABS
50.0000 mg | ORAL_TABLET | Freq: Two times a day (BID) | ORAL | Status: DC
  Filled 2018-11-25: qty 1

## 2018-11-25 MED ORDER — SERTRALINE HCL 50 MG PO TABS
50.0000 mg | ORAL_TABLET | Freq: Every day | ORAL | Status: DC
  Administered 2018-11-26: 50 mg via ORAL
  Filled 2018-11-25 (×2): qty 1

## 2018-11-25 MED ORDER — HYDROCODONE-ACETAMINOPHEN 5-325 MG PO TABS: 1.0000 | ORAL_TABLET | ORAL | Status: DC | PRN

## 2018-11-25 NOTE — Progress Notes (Signed)
Pharmacy Antibiotic Note  Richard Mendoza is a 43 y.o. male admitted on 11/25/2018 with cellulitis and abscess.  Pharmacy has been consulted for cefepime and vancomycin dosing.  BMI > 30 VD 68.6, Ke 0.098 t1/2 7.1 Scr 1.15   Plan: Will give a loading dose of 2000 mg x 1. Will start a maintenance dose of 1500 mg q12H for a predicted trough of 10 and AUC 446. Goal AUC is 400-550. Plan to obtain levels at the 4th dose.   Will also start cefepime 2 g q12H.   Height: 5\' 10"  (177.8 cm) Weight: 210 lb (95.3 kg) IBW/kg (Calculated) : 73  Temp (24hrs), Avg:98.2 F (36.8 C), Min:98.2 F (36.8 C), Max:98.2 F (36.8 C)  Recent Labs  Lab 11/25/18 1621  WBC 13.4*  CREATININE 1.15    Estimated Creatinine Clearance: 96.9 mL/min (by C-G formula based on SCr of 1.15 mg/dL).    No Known Allergies  Antimicrobials this admission: 1/7 vancomycin >>  1/7 cefepime >>   Dose adjustments this admission: none  Microbiology results: none  Thank you for allowing pharmacy to be a part of this patient's care.  Ronnald Ramp, PharmD, BCPS Clinical Pharmacist 11/25/2018 4:59 PM

## 2018-11-25 NOTE — H&P (Signed)
[facility] - [place] at [facility]   PATIENT NAME: Richard Mendoza    MR#:  [id number]  DATE OF BIRTH:  11/07/1976  DATE OF ADMISSION:  11/25/2018  PRIMARY CARE PHYSICIAN: Raynelle Bring   REQUESTING/REFERRING PHYSICIAN:   CHIEF COMPLAINT:  Referred for right foot infection by podiatry clinic  HISTORY OF PRESENT ILLNESS: Richard Mendoza  is a 43 y.o. male with a known history of diabetes mellitus type 2, hypertension, hyperlipidemia was referred by podiatry clinic for infection in the right foot.  This been going on for the last 3 weeks it started as a small blister which is increased in size.  Right foot is swollen painful and red in nature.  Pain in the right foot is aching in nature 8 out of 10 scale of 1-10.  He was sent to the hospital for MRI of the right foot and incision and drainage along with broad-spectrum IV antibiotics by podiatrist.  PAST MEDICAL HISTORY:   Past Medical History:  Diagnosis Date  . DIABETES MELLITUS, TYPE II, UNCONTROLLED 03/17/2009  . DM 12/08/2008  . HYPERLIPIDEMIA 03/17/2009  . HYPERTENSION 12/08/2008  . 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 ANTERIOR CERVICAL DECOMP/DISCECTOMY FUSION N/A 09/09/2017   Procedure: ANTERIOR CERVICAL DECOMPRESSION/DISCECTOMY FUSION CERVICAL 6- CERVICAL 7;  Surgeon: Coletta Memos, MD;  Location: [facility] OR;  Service: Neurosurgery;  Laterality: N/A;  ANTERIOR CERVICAL DECOMPRESSION/DISCECTOMY FUSION CERVICAL 6- CERVICAL 7  . APPENDECTOMY    . I&D EXTREMITY Right 10/03/2017   Procedure: IRRIGATION AND DEBRIDEMENT RIGHT WRIST;  Surgeon: Betha Loa, MD;  Location: [facility] OR;  Service: Orthopedics;  Laterality: Right;  . osteomylitis    . ROTATOR CUFF REPAIR Left     SOCIAL HISTORY:  Social History   Tobacco Use  . Smoking status: Current Every Day Smoker    Packs/day: 1.00    Years: 17.00    Pack years: 17.00    Types: Cigarettes   . Smokeless tobacco: Never Used  . Tobacco comment: currently smoking 1 ppd.    Substance Use Topics  . Alcohol use: Yes    Frequency: Never    Comment: rare    FAMILY HISTORY:  Family History  Problem Relation Age of Onset  . Diabetes Mother   . Heart disease Father   . Diabetes Father   . Arthritis Other   . Hyperlipidemia Other   . Hypertension Other   . Cancer Other        breast    DRUG ALLERGIES: No Known Allergies  REVIEW OF SYSTEMS:   CONSTITUTIONAL: No fever, fatigue or weakness.  EYES: No blurred or double vision.  EARS, NOSE, AND THROAT: No tinnitus or ear pain.  RESPIRATORY: No cough, shortness of breath, wheezing or hemoptysis.  CARDIOVASCULAR: No chest pain, orthopnea, edema.  GASTROINTESTINAL: No nausea, vomiting, diarrhea or abdominal pain.  GENITOURINARY: No dysuria, hematuria.  ENDOCRINE: No polyuria, nocturia,  HEMATOLOGY: No anemia, easy bruising or bleeding SKIN: No rash or lesion. MUSCULOSKELETAL: No joint pain or arthritis.   Has right foot swelling and pain NEUROLOGIC: No tingling, numbness, weakness.  PSYCHIATRY: No anxiety or depression.   MEDICATIONS AT HOME:  Prior to Admission medications   Medication Sig Start Date End Date Taking? Authorizing Provider  atorvastatin (LIPITOR) 20 MG tablet Take 20 mg by mouth daily. 08/23/17  Yes [provider]  busPIRone (BUSPAR) 10 MG tablet Take 10 mg by mouth 2 (two)  times daily.   Yes [provider]  [facility] SENNA 8.6 MG tablet Take 2 tablets by mouth 2 (two) times daily. 10/31/17  Yes [provider]  famotidine (PEPCID) 20 MG tablet Take 20 mg by mouth 2 (two) times daily.   Yes [provider]  gabapentin (NEURONTIN) 300 MG capsule Take 300 mg by mouth daily. 10/11/15  Yes [provider]  insulin aspart (NOVOLOG) 100 UNIT/ML injection Inject into the skin 3 (three) times daily before meals. Sliding scale   Yes [provider]  Insulin Human  (INSULIN PUMP) SOLN Inject 2.5 each into the skin every hour.   Yes [provider]  lisinopril (PRINIVIL,ZESTRIL) 5 MG tablet Take 5 mg by mouth daily. 10/14/17  Yes [provider]  metoCLOPramide (REGLAN) 10 MG tablet Take 1 tablet (10 mg total) by mouth every 6 (six) hours. 11/17/17  Yes Cheron SchaumannSofia, Leslie K, PA-C  pantoprazole (PROTONIX) 40 MG tablet Take 40 mg by mouth daily. 08/23/17  Yes [provider]  ranitidine (ZANTAC) 150 MG tablet Take 150 mg by mouth 2 (two) times daily.   Yes [provider]  sertraline (ZOLOFT) 50 MG tablet Take 50 mg by mouth daily. 10/31/17  Yes [provider]  dimenhyDRINATE (DRAMAMINE) 50 MG tablet Take 50 mg by mouth 2 (two) times daily.    [provider]  linaclotide (LINZESS) 145 MCG CAPS capsule Take 145 mcg by mouth daily before breakfast.    [provider]  oxyCODONE (ROXICODONE) 5 MG immediate release tablet Take 1 tablet (5 mg total) by mouth every 6 (six) hours as needed for severe pain. Patient not taking: Reported on 11/25/2018 09/10/17   Coletta Memosabbell, Kyle, MD  sildenafil (REVATIO) 20 MG tablet 1-5 pills as needed for ED symptoms Patient taking differently: Take 20-100 mg by mouth daily as needed (ED).  09/26/15   Romero BellingEllison, Sean, MD  sulfamethoxazole-trimethoprim (BACTRIM DS) 800-160 MG tablet Take 1 tablet 2 (two) times daily by mouth. Patient not taking: Reported on 10/21/2017 10/03/17   Betha LoaKuzma, Kevin, MD  tiZANidine (ZANAFLEX) 4 MG tablet Take 1 tablet (4 mg total) by mouth every 6 (six) hours as needed for muscle spasms. 09/10/17   Coletta Memosabbell, Kyle, MD      PHYSICAL EXAMINATION:   VITAL SIGNS: Blood pressure (!) 142/90, pulse (!) 103, temperature 98.2 F (36.8 C), temperature source Oral, resp. rate 14, height 5\' 10"  (1.778 m), weight 95.3 kg, SpO2 100 %.  GENERAL:  43 y.o.-year-old patient lying in the bed with no acute distress.  EYES: Pupils equal, round, reactive to light and  accommodation. No scleral icterus. Extraocular muscles intact.  HEENT: Head atraumatic, normocephalic. Oropharynx and nasopharynx clear.  NECK:  Supple, no jugular venous distention. No thyroid enlargement, no tenderness.  LUNGS: Normal breath sounds bilaterally, no wheezing, rales,rhonchi or crepitation. No use of accessory muscles of respiration.  CARDIOVASCULAR: S1, S2 normal. No murmurs, rubs, or gallops.  ABDOMEN: Soft, nontender, nondistended. Bowel sounds present. No organomegaly or mass.  EXTREMITIES: No cyanosis, or clubbing.  Right foot swollen Tenderness present NEUROLOGIC: Cranial nerves II through XII are intact. Muscle strength 5/5 in all extremities. Sensation intact. Gait not checked.  PSYCHIATRIC: The patient is alert and oriented x 3.  SKIN: No obvious rash, lesion, or ulcer.   LABORATORY PANEL:   CBC Recent Labs  Lab 11/25/18 1621  WBC 13.4*  HGB 11.7*  HCT 35.3*  PLT 236  MCV 84.2  MCH 27.9  MCHC 33.1  RDW  13.8   ------------------------------------------------------------------------------------------------------------------  Chemistries  Recent Labs  Lab 11/25/18 1621  NA 134*  K 3.1*  CL 103  CO2 22  GLUCOSE 274*  BUN 14  CREATININE 1.15  CALCIUM 9.0  AST 16  ALT 13  ALKPHOS 117  BILITOT 0.7   ------------------------------------------------------------------------------------------------------------------ estimated creatinine clearance is 96.9 mL/min (by C-G formula based on SCr of 1.15 mg/dL). ------------------------------------------------------------------------------------------------------------------ No results for input(s): TSH, T4TOTAL, T3FREE, THYROIDAB in the last 72 hours.  Invalid input(s): FREET3   Coagulation profile No results for input(s): INR, PROTIME in the last 168 hours. ------------------------------------------------------------------------------------------------------------------- No results for input(s):  DDIMER in the last 72 hours. -------------------------------------------------------------------------------------------------------------------  Cardiac Enzymes No results for input(s): CKMB, TROPONINI, MYOGLOBIN in the last 168 hours.  Invalid input(s): CK ------------------------------------------------------------------------------------------------------------------ Invalid input(s): POCBNP  ---------------------------------------------------------------------------------------------------------------  Urinalysis    Component Value Date/Time   COLORURINE YELLOW 11/17/2017 1530   APPEARANCEUR CLEAR 11/17/2017 1530   LABSPEC 1.028 11/17/2017 1530   PHURINE 5.0 11/17/2017 1530   GLUCOSEU >=500 (A) 11/17/2017 1530   HGBUR MODERATE (A) 11/17/2017 1530   BILIRUBINUR NEGATIVE 11/17/2017 1530   KETONESUR 5 (A) 11/17/2017 1530   PROTEINUR NEGATIVE 11/17/2017 1530   UROBILINOGEN 0.2 07/31/2011 2217   NITRITE NEGATIVE 11/17/2017 1530   LEUKOCYTESUR NEGATIVE 11/17/2017 1530     RADIOLOGY: No results found.  EKG: Orders placed or performed during the hospital encounter of 10/20/17  . EKG 12-Lead  . EKG 12-Lead  . EKG    IMPRESSION AND PLAN:  43 year old male patient with history of type 2 diabetes mellitus, hypertension, hyperlipidemia referred by podiatry clinic for right foot swelling.  - Right foot infection Assess for abscess Rule out osteomyelitis Check mri right foot to assess for abscess IV vancomycin and IV cefepime antibiotics will be started Podiatry consult Check blood cultures  -Type 2 diabetes mellitus Resume insulin pump as per home recommendations Diabetic diet  -Hyperlipidemia Continue statin medication  -Hypertension Continue ACE inhibitor  -DVT prophylaxis subcu Lovenox daily  All the records are reviewed and case discussed with ED provider. Management plans discussed with the patient, family and they are in agreement.  CODE STATUS:Full  code    Code Status Orders  (From admission, onward)         Start     Ordered   11/25/18 1607  Full code  Continuous     11/25/18 1606        Code Status History    Date Active Date Inactive Code Status Order ID Comments User Context   10/21/2017 2123 10/22/2017 2032 Full Code [id number]  Marcelo Baldy, MD ED   09/09/2017 2157 09/10/2017 1700 Full Code [id number]  Coletta Memos, MD Inpatient       TOTAL TIME TAKING CARE OF THIS PATIENT: 53 minutes.    Ihor Austin M.D on 11/25/2018 at 4:57 PM  Between 7am to 6pm - Pager - [phone]  After 6pm go to [email or web address] - password [email or web address] [facility]  [facility]  Office  [phone]  CC: Primary care physician; Raynelle Bring

## 2018-11-25 NOTE — Consult Note (Signed)
ORTHOPAEDIC CONSULTATION  REQUESTING PHYSICIAN: Ihor AustinPyreddy, Pavan, MD  Chief Complaint: Right foot infection  HPI: Richard LeepMichael Spagnolo is a 43 y.o. male who complains of pain and swelling to his right foot.  Progressively has become worse over the last several days.  He has a history of antibiotic resistant infections.  History of diabetes.  This has become quite uncomfortable.  Seen in the outpatient clinic today with a focal area of swelling to the right foot.  Subsequent referred to the hospital for IV antibiotics and planned irrigation and debridement of this plantar right foot.  Past Medical History:  Diagnosis Date  . DIABETES MELLITUS, TYPE II, UNCONTROLLED 03/17/2009  . DM 12/08/2008  . HYPERLIPIDEMIA 03/17/2009  . HYPERTENSION 12/08/2008  . 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 ANTERIOR CERVICAL DECOMP/DISCECTOMY FUSION N/A 09/09/2017   Procedure: ANTERIOR CERVICAL DECOMPRESSION/DISCECTOMY FUSION CERVICAL 6- CERVICAL 7;  Surgeon: Coletta Memosabbell, Kyle, MD;  Location: [facility] OR;  Service: Neurosurgery;  Laterality: N/A;  ANTERIOR CERVICAL DECOMPRESSION/DISCECTOMY FUSION CERVICAL 6- CERVICAL 7  . APPENDECTOMY    . I&D EXTREMITY Right 10/03/2017   Procedure: IRRIGATION AND DEBRIDEMENT RIGHT WRIST;  Surgeon: Betha LoaKuzma, Kevin, MD;  Location: [facility] OR;  Service: Orthopedics;  Laterality: Right;  . osteomylitis    . ROTATOR CUFF REPAIR Left    Social History   Socioeconomic History  . Marital status: Married    Spouse name: Not on file  . Number of children: Not on file  . Years of education: Not on file  . Highest education level: Not on file  Occupational History  . Not on file  Social Needs  . Financial resource strain: Not on file  . Food insecurity:    Worry: Not on file    Inability: Not on file  . Transportation needs:    Medical: Not on file    Non-medical: Not on file  Tobacco Use  . Smoking status: Current Every Day Smoker    Packs/day: 1.00    Years: 17.00    Pack years: 17.00    Types: Cigarettes  . Smokeless tobacco: Never Used  . Tobacco comment: currently smoking 1 ppd.    Substance and Sexual Activity  . Alcohol use: Yes    Frequency: Never    Comment: rare  . Drug use: No  . Sexual activity: Yes  Lifestyle  . Physical activity:    Days per week: Not on file    Minutes per session: Not on file  . Stress: Not on file  Relationships  . Social connections:    Talks on phone: Not on file    Gets together: Not on file    Attends religious service: Not on file    Active member of club or organization: Not on file    Attends meetings of clubs or organizations: Not on file    Relationship status: Not on file  Other Topics Concern  . Not on file  Social History Narrative  . Not on file   Family History  Problem Relation Age of Onset  . Diabetes Mother   . Heart disease Father   . Diabetes Father   . Arthritis Other   . Hyperlipidemia Other   . Hypertension Other   . Cancer Other        breast   No Known Allergies Prior to Admission medications   Medication Sig Start Date End Date Taking? Authorizing Provider  acetaminophen (TYLENOL) 500 MG tablet Take 500-1,000 mg  by mouth every 4 (four) hours as needed for mild pain or moderate pain.   Yes [provider]  atorvastatin (LIPITOR) 20 MG tablet Take 20 mg by mouth daily. 08/23/17  Yes [provider]  busPIRone (BUSPAR) 10 MG tablet Take 10 mg by mouth 2 (two) times daily.   Yes [provider]  [facility] SENNA 8.6 MG tablet Take 1 tablet by mouth 2 (two) times daily.  10/31/17  Yes [provider]  dimenhyDRINATE (DRAMAMINE) 50 MG tablet Take 50 mg by mouth 2 (two) times daily as needed for itching or dizziness.    Yes [provider]  famotidine (PEPCID) 20 MG tablet Take 20 mg by mouth 2 (two) times daily.   Yes [provider]  gabapentin (NEURONTIN) 300 MG capsule Take 600 mg by mouth at bedtime.    Yes  [provider]  ibuprofen (ADVIL,MOTRIN) 200 MG tablet Take 400-600 mg by mouth every 4 (four) hours as needed for mild pain or moderate pain.   Yes [provider]  insulin aspart (NOVOLOG) 100 UNIT/ML injection Inject into the skin 3 (three) times daily before meals. Via pump   Yes [provider]  Insulin Human (INSULIN PUMP) SOLN Inject 2.5 each into the skin every hour.   Yes [provider]  linaclotide (LINZESS) 145 MCG CAPS capsule Take 145 mcg by mouth daily as needed (GI symptoms).    Yes [provider]  lisinopril (PRINIVIL,ZESTRIL) 5 MG tablet Take 5 mg by mouth daily. 10/14/17  Yes [provider]  metoCLOPramide (REGLAN) 10 MG tablet Take 1 tablet (10 mg total) by mouth every 6 (six) hours. Patient taking differently: Take 10 mg by mouth 2 (two) times daily.  11/17/17  Yes Cheron Schaumann K, PA-C  ondansetron (ZOFRAN) 8 MG tablet Take 8 mg by mouth every 6 (six) hours as needed for nausea/vomiting.   Yes [provider]  pantoprazole (PROTONIX) 40 MG tablet Take 40 mg by mouth daily. 08/23/17  Yes [provider]  ranitidine (ZANTAC) 150 MG tablet Take 150 mg by mouth daily.    Yes [provider]  sertraline (ZOLOFT) 100 MG tablet Take 100 mg by mouth daily.  10/31/17  Yes [provider]  sildenafil (REVATIO) 20 MG tablet 1-5 pills as needed for ED symptoms Patient taking differently: Take 20-100 mg by mouth daily as needed (ED).  09/26/15  Yes Romero Belling, MD  tiZANidine (ZANAFLEX) 4 MG tablet Take 1 tablet (4 mg total) by mouth every 6 (six) hours as needed for muscle spasms. 09/10/17  Yes Coletta Memos, MD  oxyCODONE (ROXICODONE) 5 MG immediate release tablet Take 1 tablet (5 mg total) by mouth every 6 (six) hours as needed for severe pain. Patient not taking: Reported on 11/25/2018 09/10/17   Coletta Memos, MD  sulfamethoxazole-trimethoprim (BACTRIM DS) 800-160 MG tablet Take 1 tablet 2 (two)  times daily by mouth. Patient not taking: Reported on 11/25/2018 10/03/17   Betha Loa, MD   No results found.  Positive ROS: All other systems have been reviewed and were otherwise negative with the exception of those mentioned in the HPI and as above.  12 point ROS was performed.  Physical Exam: General: Alert and oriented.  No apparent distress.  Vascular:  Left foot:Dorsalis Pedis:  present Posterior Tibial:  present  Right foot: Dorsalis Pedis:  present Posterior Tibial:  present  Neuro: Diminished sensation though gross sensation was intact with exquisite pain to the right foot.  Derm: There is a small ulceration to the plantar aspect of the right foot.  This is very uncomfortable with any attempted range of motion.  Surrounding erythema and cellulitis extending into the mid arch.  Ortho/MS: Diffuse edema to the right foot with pain.  Assessment: Abscess right foot with infection  Plan: He will need I&D of this abscessed area tomorrow.  MRI has been ordered.  Broad-spectrum antibiotics for now.  We will plan for intraoperative culture tomorrow as well.  He will need to be nonweightbearing thereafter with a plantar incision on the right foot.  Will have physical therapy evaluate in the next couple of days.  I discussed this with the patient in great detail and consent has been given.    Irean Hong, DPM Cell [phone]   11/25/2018 5:27 PM

## 2018-11-25 NOTE — Progress Notes (Signed)
Advanced care plan.  Purpose of the Encounter: CODE STATUS  Parties in Attendance:Patient  Patient's Decision Capacity:Good  Subjective/Patient's story: Referred from podiatry clinic for right foot infection   Objective/Medical story Needs evaluation for right foot abscess with mri of foot Needs podiatry evaluation for drainage of right foot   Goals of care determination:  Advance care directives and goals of care discussed Patient wants everything done which includes cpr, intubation and ventilator if need arises   CODE STATUS: Full code   Time spent discussing advanced care planning: 16 minutes

## 2018-11-26 ENCOUNTER — Encounter
Admission: AD | Disposition: A | Discharge status: Home Health | Payer: Self-pay | Source: Ambulatory Visit | Attending: Internal Medicine

## 2018-11-26 ENCOUNTER — Encounter: Payer: Self-pay | Admitting: *Deleted

## 2018-11-26 ENCOUNTER — Inpatient Hospital Stay: Admission: RE | Admit: 2018-11-26 | Payer: Medicaid Other | Source: Home / Self Care | Admitting: Podiatry

## 2018-11-26 ENCOUNTER — Inpatient Hospital Stay: Payer: Medicaid Other | Admitting: Anesthesiology

## 2018-11-26 HISTORY — PX: I & D EXTREMITY: SHX5045

## 2018-11-26 LAB — CBC
HCT: 32.3 % — ABNORMAL LOW (ref 39.0–52.0)
Hemoglobin: 10.5 g/dL — ABNORMAL LOW (ref 13.0–17.0)
MCH: 27.8 pg (ref 26.0–34.0)
MCHC: 32.5 g/dL (ref 30.0–36.0)
MCV: 85.4 fL (ref 80.0–100.0)
Platelets: 223 10*3/uL (ref 150–400)
RBC: 3.78 MIL/uL — ABNORMAL LOW (ref 4.22–5.81)
RDW: 13.8 % (ref 11.5–15.5)
WBC: 11.8 10*3/uL — ABNORMAL HIGH (ref 4.0–10.5)
nRBC: 0 % (ref 0.0–0.2)

## 2018-11-26 LAB — GLUCOSE, CAPILLARY
GLUCOSE-CAPILLARY: 112 mg/dL — AB (ref 70–99)
Glucose-Capillary: 127 mg/dL — ABNORMAL HIGH (ref 70–99)
Glucose-Capillary: 104 mg/dL — ABNORMAL HIGH (ref 70–99)
Glucose-Capillary: 97 mg/dL (ref 70–99)
Glucose-Capillary: 103 mg/dL — ABNORMAL HIGH (ref 70–99)
Glucose-Capillary: 314 mg/dL — ABNORMAL HIGH (ref 70–99)

## 2018-11-26 LAB — BASIC METABOLIC PANEL
Anion gap: 8 (ref 5–15)
BUN: 14 mg/dL (ref 6–20)
CO2: 22 mmol/L (ref 22–32)
Calcium: 8.5 mg/dL — ABNORMAL LOW (ref 8.9–10.3)
Chloride: 105 mmol/L (ref 98–111)
Creatinine, Ser: 1.1 mg/dL (ref 0.61–1.24)
GFR calc Af Amer: >60 mL/min (ref >60)
GFR calc non Af Amer: >60 mL/min (ref >60)
GLUCOSE: 185 mg/dL — AB (ref 70–99)
Potassium: 2.8 mmol/L — ABNORMAL LOW (ref 3.5–5.1)
Sodium: 135 mmol/L (ref 135–145)

## 2018-11-26 LAB — PROTIME-INR
INR: 1.07
Prothrombin Time: 13.8 seconds (ref 11.4–15.2)

## 2018-11-26 LAB — HIV ANTIBODY (ROUTINE TESTING W REFLEX): HIV Screen 4th Generation wRfx: NONREACTIVE

## 2018-11-26 LAB — MAGNESIUM: Magnesium: 1.7 mg/dL (ref 1.7–2.4)

## 2018-11-26 LAB — MRSA PCR SCREENING: MRSA by PCR: POSITIVE — AB

## 2018-11-26 LAB — POTASSIUM: Potassium: 3.6 mmol/L (ref 3.5–5.1)

## 2018-11-26 SURGERY — IRRIGATION AND DEBRIDEMENT EXTREMITY
Anesthesia: General | Site: Foot | Laterality: Right

## 2018-11-26 MED ORDER — BUSPIRONE HCL 10 MG PO TABS
10.0000 mg | ORAL_TABLET | Freq: Two times a day (BID) | ORAL | Status: DC
  Administered 2018-11-26 – 2018-11-29 (×7): 10 mg via ORAL
  Filled 2018-11-26 (×8): qty 1

## 2018-11-26 MED ORDER — METOCLOPRAMIDE HCL 10 MG PO TABS
10.0000 mg | ORAL_TABLET | Freq: Two times a day (BID) | ORAL | Status: DC
  Administered 2018-11-26 – 2018-11-29 (×6): 10 mg via ORAL
  Filled 2018-11-26 (×6): qty 1

## 2018-11-26 MED ORDER — SERTRALINE HCL 50 MG PO TABS
50.0000 mg | ORAL_TABLET | Freq: Once | ORAL | Status: AC
  Administered 2018-11-26: 50 mg via ORAL
  Filled 2018-11-26: qty 1

## 2018-11-26 MED ORDER — SENNA 8.6 MG PO TABS
1.0000 | ORAL_TABLET | Freq: Two times a day (BID) | ORAL | Status: DC
  Administered 2018-11-26 – 2018-11-29 (×6): 8.6 mg via ORAL
  Filled 2018-11-26 (×6): qty 1

## 2018-11-26 MED ORDER — MIDAZOLAM HCL 2 MG/2ML IJ SOLN
INTRAMUSCULAR | Status: DC | PRN
  Administered 2018-11-26: 2 mg via INTRAVENOUS

## 2018-11-26 MED ORDER — POTASSIUM CHLORIDE CRYS ER 20 MEQ PO TBCR
40.0000 meq | EXTENDED_RELEASE_TABLET | Freq: Once | ORAL | Status: AC
  Administered 2018-11-26: 40 meq via ORAL
  Filled 2018-11-26: qty 2

## 2018-11-26 MED ORDER — CHLORHEXIDINE GLUCONATE 4 % EX LIQD: 60.0000 mL | Freq: Once | CUTANEOUS | Status: DC

## 2018-11-26 MED ORDER — LIDOCAINE HCL (CARDIAC) PF 100 MG/5ML IV SOSY
PREFILLED_SYRINGE | INTRAVENOUS | Status: DC | PRN
  Administered 2018-11-26: 100 mg via INTRAVENOUS

## 2018-11-26 MED ORDER — PROPOFOL 10 MG/ML IV BOLUS
INTRAVENOUS | Status: DC | PRN
  Administered 2018-11-26: 150 mg via INTRAVENOUS
  Administered 2018-11-26: 50 mg via INTRAVENOUS

## 2018-11-26 MED ORDER — SERTRALINE HCL 50 MG PO TABS
100.0000 mg | ORAL_TABLET | Freq: Every day | ORAL | Status: DC
  Administered 2018-11-27 – 2018-11-29 (×3): 100 mg via ORAL
  Filled 2018-11-26 (×3): qty 2

## 2018-11-26 MED ORDER — DIMENHYDRINATE 50 MG PO TABS
50.0000 mg | ORAL_TABLET | ORAL | Status: DC | PRN
  Filled 2018-11-26: qty 1

## 2018-11-26 MED ORDER — ONDANSETRON HCL 4 MG/2ML IJ SOLN: 4.0000 mg | Freq: Once | INTRAMUSCULAR | Status: DC | PRN

## 2018-11-26 MED ORDER — POVIDONE-IODINE 10 % EX SWAB: 2.0000 "application " | Freq: Once | CUTANEOUS | Status: DC

## 2018-11-26 MED ORDER — HYDROCODONE-ACETAMINOPHEN 5-325 MG PO TABS
1.0000 | ORAL_TABLET | Freq: Four times a day (QID) | ORAL | Status: DC | PRN
  Administered 2018-11-26 – 2018-11-29 (×7): 2 via ORAL
  Filled 2018-11-26 (×7): qty 2

## 2018-11-26 MED ORDER — POTASSIUM CHLORIDE 10 MEQ/100ML IV SOLN
10.0000 meq | INTRAVENOUS | Status: AC
  Administered 2018-11-26 (×4): 10 meq via INTRAVENOUS
  Filled 2018-11-26 (×8): qty 100

## 2018-11-26 MED ORDER — LIDOCAINE-EPINEPHRINE 1 %-1:100000 IJ SOLN
INTRAMUSCULAR | Status: DC | PRN
  Administered 2018-11-26: 10 mL

## 2018-11-26 MED ORDER — DEXAMETHASONE SODIUM PHOSPHATE 10 MG/ML IJ SOLN
INTRAMUSCULAR | Status: DC | PRN
  Administered 2018-11-26: 5 mg via INTRAVENOUS

## 2018-11-26 MED ORDER — THROMBIN 5000 UNITS EX SOLR
CUTANEOUS | Status: AC
  Filled 2018-11-26: qty 5000

## 2018-11-26 MED ORDER — BUPIVACAINE HCL 0.5 % IJ SOLN
INTRAMUSCULAR | Status: DC | PRN
  Administered 2018-11-26: 10 mL
  Administered 2018-11-26: 20 mL

## 2018-11-26 MED ORDER — MUPIROCIN 2 % EX OINT
1.0000 "application " | TOPICAL_OINTMENT | Freq: Two times a day (BID) | CUTANEOUS | Status: DC
  Administered 2018-11-26 – 2018-11-28 (×6): 1 via NASAL
  Filled 2018-11-26: qty 22

## 2018-11-26 MED ORDER — FENTANYL CITRATE (PF) 100 MCG/2ML IJ SOLN
INTRAMUSCULAR | Status: DC | PRN
  Administered 2018-11-26: 50 ug via INTRAVENOUS
  Administered 2018-11-26 (×2): 25 ug via INTRAVENOUS

## 2018-11-26 MED ORDER — LIDOCAINE HCL (PF) 1 % IJ SOLN
INTRAMUSCULAR | Status: AC
  Filled 2018-11-26: qty 30

## 2018-11-26 MED ORDER — FENTANYL CITRATE (PF) 100 MCG/2ML IJ SOLN: 25.0000 ug | INTRAMUSCULAR | Status: DC | PRN

## 2018-11-26 MED ORDER — CHLORHEXIDINE GLUCONATE CLOTH 2 % EX PADS
6.0000 | MEDICATED_PAD | Freq: Every day | CUTANEOUS | Status: DC
  Administered 2018-11-26 – 2018-11-29 (×3): 6 via TOPICAL

## 2018-11-26 MED ORDER — PHENYLEPHRINE HCL 10 MG/ML IJ SOLN
INTRAMUSCULAR | Status: DC | PRN
  Administered 2018-11-26: 150 ug via INTRAVENOUS
  Administered 2018-11-26: 100 ug via INTRAVENOUS
  Administered 2018-11-26 (×2): 150 ug via INTRAVENOUS

## 2018-11-26 SURGICAL SUPPLY — 64 items
BANDAGE ACE 4X5 VEL STRL LF (GAUZE/BANDAGES/DRESSINGS) ×2 IMPLANT
BLADE OSC/SAGITTAL MD 5.5X18 (BLADE) IMPLANT
BLADE OSCILLATING/SAGITTAL (BLADE)
BLADE SW THK.38XMED LNG THN (BLADE) IMPLANT
BNDG COHESIVE 4X5 TAN STRL (GAUZE/BANDAGES/DRESSINGS) ×2 IMPLANT
BNDG COHESIVE 6X5 TAN STRL LF (GAUZE/BANDAGES/DRESSINGS) ×2 IMPLANT
BNDG CONFORM 2 STRL LF (GAUZE/BANDAGES/DRESSINGS) ×2 IMPLANT
BNDG CONFORM 3 STRL LF (GAUZE/BANDAGES/DRESSINGS) ×1 IMPLANT
BNDG ESMARK 4X12 TAN STRL LF (GAUZE/BANDAGES/DRESSINGS) ×2 IMPLANT
BNDG GAUZE 4.5X4.1 6PLY STRL (MISCELLANEOUS) ×2 IMPLANT
CANISTER SUCT 1200ML W/VALVE (MISCELLANEOUS) ×2 IMPLANT
CANISTER SUCT 3000ML PPV (MISCELLANEOUS) ×2 IMPLANT
COVER WAND RF STERILE (DRAPES) ×2 IMPLANT
CUFF TOURN 18 STER (MISCELLANEOUS) ×2 IMPLANT
CUFF TOURN DUAL PL 12 NO SLV (MISCELLANEOUS) ×1 IMPLANT
DRAPE FLUOR MINI C-ARM 54X84 (DRAPES) IMPLANT
DRAPE XRAY CASSETTE 23X24 (DRAPES) IMPLANT
DRESSING ALLEVYN 4X4 (MISCELLANEOUS) IMPLANT
DURAPREP 26ML APPLICATOR (WOUND CARE) ×2 IMPLANT
ELECT REM PT RETURN 9FT ADLT (ELECTROSURGICAL) ×2
ELECTRODE REM PT RTRN 9FT ADLT (ELECTROSURGICAL) ×1 IMPLANT
GAUZE PACKING 1/4 X5 YD (GAUZE/BANDAGES/DRESSINGS) ×1 IMPLANT
GAUZE PACKING IODOFORM 1X5 (MISCELLANEOUS) ×2 IMPLANT
GAUZE PETRO XEROFOAM 1X8 (MISCELLANEOUS) ×1 IMPLANT
GAUZE SPONGE 4X4 12PLY STRL (GAUZE/BANDAGES/DRESSINGS) ×2 IMPLANT
GLOVE BIO SURGEON STRL SZ7.5 (GLOVE) ×2 IMPLANT
GLOVE INDICATOR 8.0 STRL GRN (GLOVE) ×2 IMPLANT
GOWN STRL REUS W/ TWL LRG LVL3 (GOWN DISPOSABLE) ×2 IMPLANT
GOWN STRL REUS W/TWL LRG LVL3 (GOWN DISPOSABLE) ×4
GOWN STRL REUS W/TWL MED LVL3 (GOWN DISPOSABLE) ×4 IMPLANT
HANDLE YANKAUER SUCT BULB TIP (MISCELLANEOUS) ×1 IMPLANT
HANDPIECE VERSAJET DEBRIDEMENT (MISCELLANEOUS) IMPLANT
IV NS 1000ML (IV SOLUTION)
IV NS 1000ML BAXH (IV SOLUTION) ×1 IMPLANT
KIT TURNOVER KIT A (KITS) ×2 IMPLANT
LABEL OR SOLS (LABEL) ×2 IMPLANT
NDL FILTER BLUNT 18X1 1/2 (NEEDLE) ×1 IMPLANT
NDL HYPO 25X1 1.5 SAFETY (NEEDLE) ×1 IMPLANT
NEEDLE FILTER BLUNT 18X 1/2SAF (NEEDLE) ×1
NEEDLE FILTER BLUNT 18X1 1/2 (NEEDLE) ×1 IMPLANT
NEEDLE HYPO 25X1 1.5 SAFETY (NEEDLE) ×2 IMPLANT
NS IRRIG 500ML POUR BTL (IV SOLUTION) ×2 IMPLANT
PACK EXTREMITY ARMC (MISCELLANEOUS) ×2 IMPLANT
PAD ABD DERMACEA PRESS 5X9 (GAUZE/BANDAGES/DRESSINGS) ×3 IMPLANT
PULSAVAC PLUS IRRIG FAN TIP (DISPOSABLE) ×2
RASP SM TEAR CROSS CUT (RASP) IMPLANT
SHIELD FULL FACE ANTIFOG 7M (MISCELLANEOUS) ×2 IMPLANT
SOL .9 NS 3000ML IRR  AL (IV SOLUTION)
SOL .9 NS 3000ML IRR AL (IV SOLUTION)
SOL .9 NS 3000ML IRR UROMATIC (IV SOLUTION) ×1 IMPLANT
SOL PREP PVP 2OZ (MISCELLANEOUS)
SOLUTION PREP PVP 2OZ (MISCELLANEOUS) ×1 IMPLANT
STOCKINETTE IMPERVIOUS 9X36 MD (GAUZE/BANDAGES/DRESSINGS) ×2 IMPLANT
SUT ETHILON 2 0 FS 18 (SUTURE) ×3 IMPLANT
SUT ETHILON 4-0 (SUTURE)
SUT ETHILON 4-0 FS2 18XMFL BLK (SUTURE)
SUT VIC AB 3-0 SH 27 (SUTURE)
SUT VIC AB 3-0 SH 27X BRD (SUTURE) ×1 IMPLANT
SUT VIC AB 4-0 FS2 27 (SUTURE) ×1 IMPLANT
SUTURE ETHLN 4-0 FS2 18XMF BLK (SUTURE) ×1 IMPLANT
SWAB CULTURE AMIES ANAERIB BLU (MISCELLANEOUS) ×1 IMPLANT
SYR 10ML LL (SYRINGE) ×4 IMPLANT
SYR 3ML LL SCALE MARK (SYRINGE) ×2 IMPLANT
TIP FAN IRRIG PULSAVAC PLUS (DISPOSABLE) ×1 IMPLANT

## 2018-11-26 NOTE — Anesthesia Procedure Notes (Signed)
Procedure Name: LMA Insertion Date/Time: 11/26/2018 3:43 PM Performed by: Oletta Lamas, CRNA Pre-anesthesia Checklist: Patient identified, Emergency Drugs available, Suction available, Patient being monitored and Timeout performed Patient Re-evaluated:Patient Re-evaluated prior to induction Oxygen Delivery Method: Circle system utilized Preoxygenation: Pre-oxygenation with 100% oxygen Induction Type: IV induction Ventilation: Mask ventilation without difficulty LMA: LMA inserted LMA Size: 5.0 Number of attempts: 1 Tube secured with: Tape Dental Injury: Teeth and Oropharynx as per pre-operative assessment

## 2018-11-26 NOTE — Op Note (Signed)
Operative note   Surgeon:Julietta Batterman Armed forces logistics/support/administrative officer: None    Preop diagnosis: Abscess plantar right foot    Postop diagnosis: Same    Procedure: Incision and drainage abscess plantar right foot    EBL: 20 mL's    Anesthesia:local and general.  Local consisted of a one-to-one mixture of 0.5% bupivacaine and 1% lidocaine epinephrine preoperatively.  Postoperatively 20 mL's of 0.5% bupivacaine was infiltrated to the area.    Hemostasis: Ankle tourniquet inflated to 200 mmHg for approximately 15 minutes    Specimen: Ulcer plantar right foot and deep wound culture plantar right foot    Complications: None    Operative indications:Richard Mendoza is an 43 y.o. that presents today for surgical intervention.  The risks/benefits/alternatives/complications have been discussed and consent has been given.    Procedure:  Patient was brought into the OR and placed on the operating table in thesupine position. After anesthesia was obtained theright lower extremity was prepped and draped in usual sterile fashion.  Attention was directed to the plantar aspect of the right foot were plantar to the fourth metatarsal head there was noted an ulceration.  This probes deep into the subcutaneous tissue with purulent drainage.  A blister had formed into the medial aspect of the arch consistent with draining abscess to the area.  A full-thickness incision was taken through the plantar aspect of the foot to the fourth MTPJ carried into the medial arch region.  Blunt dissection carried down to the plantar fascial area.  Purulent drainage was noted throughout this incision site.  Deep dissection was taken down with a hemostat into the intermetatarsal spaces.  There was noted to be a small amount of purulence deep to the plantar fascial region.  Further probing did not reveal any other abscessed areas.  The wound was flushed with copious amounts of irrigation.  The tourniquet was dropped and bleeders were Bovie  cauterized as needed.  Closure of the incision was performed with a 2-0 nylon.  The ulcerative site was completely excised with a 15 blade and a portion of the ulcerative tissue was sent for pathological examination.  The left rib area was then packed open with iodoform packing.  A bulky sterile dressing was applied to the right foot.    Patient tolerated the procedure and anesthesia well.  Was transported from the OR to the PACU with all vital signs stable and vascular status intact. To be discharged to the floor per routine protocol.

## 2018-11-26 NOTE — Progress Notes (Signed)
Called Dr. Marjie Skiff regarding patient's potassium level of 2.8.  Appropriate orders were placed.  Arturo Morton  11/26/2018  6:49 AM

## 2018-11-26 NOTE — Anesthesia Preprocedure Evaluation (Signed)
Anesthesia Evaluation  Patient identified by MRN, date of birth, ID band Patient awake    Reviewed: Allergy & Precautions, NPO status , Patient's Chart, lab work & pertinent test results  History of Anesthesia Complications Negative for: history of anesthetic complications  Airway Mallampati: II  TM Distance: >3 FB     Dental  (+) Poor Dentition, Dental Advisory Given   Pulmonary sleep apnea , Current Smoker,    breath sounds clear to auscultation       Cardiovascular hypertension, Pt. on medications  Rhythm:Regular Rate:Normal     Neuro/Psych negative neurological ROS  negative psych ROS   GI/Hepatic negative GI ROS, Neg liver ROS,   Endo/Other  diabetes  Renal/GU Renal disease  negative genitourinary   Musculoskeletal negative musculoskeletal ROS (+)   Abdominal   Peds negative pediatric ROS (+)  Hematology negative hematology ROS (+)   Anesthesia Other Findings   Reproductive/Obstetrics negative OB ROS                             Anesthesia Physical  Anesthesia Plan  ASA: III and emergent  Anesthesia Plan: General   Post-op Pain Management:    Induction: Intravenous  PONV Risk Score and Plan: 2 and Ondansetron, Dexamethasone, Midazolam and Treatment may vary due to age or medical condition  Airway Management Planned: LMA  Additional Equipment:   Intra-op Plan:   Post-operative Plan: Extubation in OR  Informed Consent: I have reviewed the patients History and Physical, chart, labs and discussed the procedure including the risks, benefits and alternatives for the proposed anesthesia with the patient or authorized representative who has indicated his/her understanding and acceptance.   Dental advisory given  Plan Discussed with: CRNA, Anesthesiologist and Surgeon  Anesthesia Plan Comments:         Anesthesia Quick Evaluation

## 2018-11-26 NOTE — Progress Notes (Addendum)
Inpatient Diabetes Program Recommendations  AACE/ADA: New Consensus Statement on Inpatient Glycemic Control  Target Ranges:  Prepandial:   less than 140 mg/dL      Peak postprandial:   less than 180 mg/dL (1-2 hours)      Critically ill patients:  140 - 180 mg/dL   Results for Richard Mendoza, Richard Mendoza (MRN [id number]) as of 11/26/2018 08:46  Ref. Range 11/25/2018 16:41 11/26/2018 07:34  Glucose-Capillary Latest Ref Range: 70 - 99 mg/dL 158 (H) 309 (H)  Results for Richard Mendoza, Richard Mendoza (MRN [id number]) as of 11/26/2018 08:46  Ref. Range 09/09/2017 18:24  Hemoglobin A1C Latest Ref Range: 4.8 - 5.6 % 11.2 (H)   Review of Glycemic Control  Diabetes history: DM2 Outpatient Diabetes medications: 630G Medtronic insulin pump with Novolog Current orders for Inpatient glycemic control: Insulin Pump Q4H  Inpatient Diabetes Program Recommendations:  HgbA1C: A1C 11.5% on 09/15/2018 (in Care Everywhere). Noted patient is followed by [facility] and seen M. Younts, ANP on 09/15/18. At that time insulin pump basal rate was increased to 2.1 units per hour.  NOTE: In reviewing chart, noted patient has DM2 hx and uses an insulin pump with Novolog for DM control. Per office visit note on 09/15/18 by M. Younts, ANP the following should be patient's insulin pump settings:  Basal insulin  12A 2.1 units/hour Total daily basal insulin: 50.4 units/24 hours  Carb Coverage 1:6 1 unit for every 6 grams of carbohydrates  Insulin Sensitivity 1:24 1 unit drops blood glucose 24 mg/dl  Target Glucose Goals 10R 100-110 mg/dl   NURSING: If not already done, please print off the Patient insulin pump contract and flow sheet. The insulin pump contract should be signed by the patient and then placed in the chart. The patient insulin pump flow sheet will be completed by the patient at the bedside and the RN caring for the patient will use the patient's flow sheet to document in the [facility]. RN will need to complete the Nursing  Insulin Pump Flowsheet at least once a shift. Patient will need to keep extra insulin pump supplies at the bedside at all times.   Addendum 11/26/18@11 :20- Spoke with patient regarding diabetes and home regimen for diabetes management.  Patient uses a Medtronic insulin pump with Novolog insulin as an outpatient. Patient has insulin pump connected and is using his insulin pump for inpatient glycemic control. Reviewed insulin pump settings and they are exactly as noted above. Patient states that his glucose has improved since he last seen Endocrinology in October but he notes that his glucose still runs high and he feels he will need additional insulin pump setting adjustments at his next Endocrinology appointment this month.  Patient states that he is doing better with counting carbohydrates and entering them each time into his pump so he can give himself insulin bolus (via pump) for carbohydrates consumed. Reminded patient that meal ticket trays have the exact amount of carbohydrates in each food and/or beverage.  Patient states that he has all extra insulin pump supplies and extra Novolog with him at bedside. Patient verbalized understanding of information discussed and states that he does not have any further questions related to diabetes at this time.  Will continue to follow along while inpatient.   Thanks, Orlando Penner, RN, MSN, CDE Diabetes Coordinator Inpatient Diabetes Program [phone] (Team Pager from 8am to 5pm)

## 2018-11-26 NOTE — Progress Notes (Addendum)
[employer] [facility] [facility] [place] [facility]   PATIENT NAME: Richard Mendoza    MR#:  [id number]  DATE OF BIRTH:  [birth year]  SUBJECTIVE:   patient going for surgery today  REVIEW OF SYSTEMS:    Review of Systems  Constitutional: Negative for fever, chills weight loss HENT: Negative for ear pain, nosebleeds, congestion, facial swelling, rhinorrhea, neck pain, neck stiffness and ear discharge.   Respiratory: Negative for cough, shortness of breath, wheezing  Cardiovascular: Negative for chest pain, palpitations and leg swelling.  Gastrointestinal: Negative for heartburn, abdominal pain, vomiting, diarrhea or consitpation Genitourinary: Negative for dysuria, urgency, frequency, hematuria Musculoskeletal: Negative for back pain or joint pain Neurological: Negative for dizziness, seizures, syncope, focal weakness,  numbness and headaches.  Hematological: Does not bruise/bleed easily.  Psychiatric/Behavioral: Negative for hallucinations, confusion, dysphoric mood  Foot infection/abscess  Tolerating Diet: N.p.o.      DRUG ALLERGIES:  No Known Allergies  VITALS:  Blood pressure 126/84, pulse 89, temperature 99.7 F (37.6 C), temperature source Oral, resp. rate 20, height 5\' 10"  (1.778 m), weight 95.3 kg, SpO2 98 %.  PHYSICAL EXAMINATION:  Constitutional: Appears well-developed and well-nourished. No distress. HENT: Normocephalic. Marland Kitchen Oropharynx is clear and moist.  Eyes: Conjunctivae and EOM are normal. PERRLA, no scleral icterus.  Neck: Normal ROM. Neck supple. No JVD. No tracheal deviation. CVS: RRR, S1/S2 +, no murmurs, no gallops, no carotid bruit.  Pulmonary: Effort and breath sounds normal, no stridor, rhonchi, wheezes, rales.  Abdominal: Soft. BS +,  no distension, tenderness, rebound or guarding.  Musculoskeletal: Normal range of motion. No edema and no tenderness.  Neuro: Alert. CN 2-12 grossly intact. No focal deficits. Skin: Diffuse edema right foot with  abscess   psychiatric: Normal mood and affect.      LABORATORY PANEL:   CBC Recent Labs  Lab 11/26/18 0331  WBC 11.8*  HGB 10.5*  HCT 32.3*  PLT 223   ------------------------------------------------------------------------------------------------------------------  Chemistries  Recent Labs  Lab 11/25/18 1621 11/26/18 0331  NA 134* 135  K 3.1* 2.8*  CL 103 105  CO2 22 22  GLUCOSE 274* 185*  BUN 14 14  CREATININE 1.15 1.10  CALCIUM 9.0 8.5*  MG  --  1.7  AST 16  --   ALT 13  --   ALKPHOS 117  --   BILITOT 0.7  --    ------------------------------------------------------------------------------------------------------------------  Cardiac Enzymes No results for input(s): TROPONINI in the last 168 hours. ------------------------------------------------------------------------------------------------------------------  RADIOLOGY:  Mr Foot Right W Wo Contrast  Result Date: 11/25/2018 CLINICAL DATA:  Small ulcerations along the plantar aspect of the right foot. EXAM: MRI OF THE RIGHT FOREFOOT WITHOUT AND WITH CONTRAST TECHNIQUE: Multiplanar, multisequence MR imaging of the right foot was performed before and after the administration of intravenous contrast. CONTRAST:  9.5 mL Gadavist COMPARISON:  None. FINDINGS: Bones/Joint/Cartilage 1. Distal tibia and fibula: Intact without marrow signal abnormality. 2. Ankle and subtalar joints: Intact with trace ankle joint fluid more likely physiologic. No capsular bulging is noted the lateral images through the ankle. 3. Talus: Normal in signal intensity and morphology. No suspicious osseous lesion or fracture. 4. Calcaneus: Enthesopathy along the plantar dorsal aspect. No marrow signal abnormality. 5. Midfoot: No acute marrow signal abnormality, fracture or joint dislocation. 6. Forefoot: No marrow signal abnormality, fracture or findings suspicious for osteomyelitis. Ligaments Negative Muscles and Tendons No evidence of pyomyositis.  No muscle atrophy. No tenosynovitis. The tendons crossing the ankle and foot are of normal signal intensity morphology  without disruption or tear. Mild thickening and tendinosis of the distal Achilles near the insertion on the calcaneus. Soft tissues Subcutaneous soft tissue edema surrounding the forefoot more so dorsally. Soft tissue ulcerations are identified along the plantar aspect of the forefoot at the level of the fourth and fifth metatarsal heads with soft tissue induration and probable granulation tissue. No drainable fluid collection or abscess is identified. IMPRESSION: 1. Superficial plantar ulcerations along the lateral aspect of the forefoot at the level of the fourth and fifth metatarsal heads. No adjacent marrow signal abnormality to suggest acute osteomyelitis. 2. Forefoot cellulitis with diffuse soft tissue edema is noted without drainable fluid collection or abscess. 3. Calcaneal enthesopathy. Electronically Signed   By: Tollie Ethavid  Kwon M.D.   On: 11/25/2018 22:17     ASSESSMENT AND PLAN:    43 year old male with history of diabetes and essential hypertension who is referred by podiatry for right foot abscess.  1.  Right foot abscess: Patient is plan for I&D today. ID consultation placed and discussed with Dr. Rudene Andaavi Shanker Continue cefepime and vancomycin    2. Uncontrolled diabetes with A1c of 11.5 in October: Continue current management recommendations by diabetes nurse.  3.  Essential hypertension: Continue lisinopril  4.Tobacco dependence: Patient is encouraged to quit smoking. Counseling was provided for 4 minutes. He has a nicotine patch placed   5.  Depression: Continue Zoloft and BuSpar  6.  Hyperlipidemia: Continue statin  7.  Hypokalemia: Replete and recheck in a.m. Management plans discussed with the patient and he is in agreement.  CODE STATUS: FULL  TOTAL TIME TAKING CARE OF THIS PATIENT: 30 minutes.     POSSIBLE D/C 2 days, DEPENDING ON CLINICAL  CONDITION.   Kaydi Kley M.D on 11/26/2018 at 10:28 AM  Between 7am to 6pm - Pager - [phone] After 6pm go to [email or web address] - password [email or web address]  [facility]  Office  [phone]  CC: Primary care physician; [facility]  Note: This dictation was prepared with Dragon dictation along with smaller phrase technology. Any transcriptional errors that result from this process are unintentional.

## 2018-11-26 NOTE — Progress Notes (Signed)
ID Went to see patient twice- was not in his room. Will do the consult tomorrow

## 2018-11-26 NOTE — Anesthesia Post-op Follow-up Note (Signed)
Anesthesia QCDR form completed.        

## 2018-11-26 NOTE — Transfer of Care (Signed)
Immediate Anesthesia Transfer of Care Note  Patient: Richard Mendoza  Procedure(s) Performed: IRRIGATION AND DEBRIDEMENT FASCIA ON RIGHT FOOT (Right Foot)  Patient Location: PACU  Anesthesia Type:General  Level of Consciousness: drowsy  Airway & Oxygen Therapy: Patient Spontanous Breathing and Patient connected to face mask oxygen  Post-op Assessment: Report given to RN and Post -op Vital signs reviewed and stable  Post vital signs: stable  Last Vitals:  Vitals Value Taken Time  BP 105/65 11/26/2018  4:39 PM  Temp 37.1 C 11/26/2018  4:39 PM  Pulse 80 11/26/2018  4:44 PM  Resp 29 11/26/2018  4:44 PM  SpO2 100 % 11/26/2018  4:44 PM  Vitals shown include unvalidated device data.  Last Pain:  Vitals:   11/26/18 1639  TempSrc:   PainSc: Asleep         Complications: No apparent anesthesia complications

## 2018-11-27 ENCOUNTER — Encounter: Payer: Self-pay | Admitting: Podiatry

## 2018-11-27 DIAGNOSIS — E11628 Type 2 diabetes mellitus with other skin complications: Secondary | ICD-10-CM

## 2018-11-27 DIAGNOSIS — F339 Major depressive disorder, recurrent, unspecified: Secondary | ICD-10-CM

## 2018-11-27 DIAGNOSIS — E785 Hyperlipidemia, unspecified: Secondary | ICD-10-CM

## 2018-11-27 DIAGNOSIS — R358 Other polyuria: Secondary | ICD-10-CM

## 2018-11-27 DIAGNOSIS — F17200 Nicotine dependence, unspecified, uncomplicated: Secondary | ICD-10-CM

## 2018-11-27 DIAGNOSIS — L84 Corns and callosities: Secondary | ICD-10-CM

## 2018-11-27 DIAGNOSIS — Z794 Long term (current) use of insulin: Secondary | ICD-10-CM

## 2018-11-27 DIAGNOSIS — L089 Local infection of the skin and subcutaneous tissue, unspecified: Secondary | ICD-10-CM

## 2018-11-27 DIAGNOSIS — I10 Essential (primary) hypertension: Secondary | ICD-10-CM

## 2018-11-27 DIAGNOSIS — Z8614 Personal history of Methicillin resistant Staphylococcus aureus infection: Secondary | ICD-10-CM

## 2018-11-27 DIAGNOSIS — Z79899 Other long term (current) drug therapy: Secondary | ICD-10-CM

## 2018-11-27 DIAGNOSIS — Z872 Personal history of diseases of the skin and subcutaneous tissue: Secondary | ICD-10-CM

## 2018-11-27 LAB — GLUCOSE, CAPILLARY
GLUCOSE-CAPILLARY: 255 mg/dL — AB (ref 70–99)
Glucose-Capillary: 251 mg/dL — ABNORMAL HIGH (ref 70–99)
Glucose-Capillary: 252 mg/dL — ABNORMAL HIGH (ref 70–99)
Glucose-Capillary: 248 mg/dL — ABNORMAL HIGH (ref 70–99)
Glucose-Capillary: 141 mg/dL — ABNORMAL HIGH (ref 70–99)
Glucose-Capillary: 202 mg/dL — ABNORMAL HIGH (ref 70–99)
Glucose-Capillary: 210 mg/dL — ABNORMAL HIGH (ref 70–99)

## 2018-11-27 NOTE — Progress Notes (Signed)
Inpatient Diabetes Program Recommendations  AACE/ADA: New Consensus Statement on Inpatient Glycemic Control  Target Ranges:  Prepandial:   less than 140 mg/dL      Peak postprandial:   less than 180 mg/dL (1-2 hours)      Critically ill patients:  140 - 180 mg/dL  Results for MONTAY, GERTON (MRN [id number]) as of 11/27/2018 08:07  Ref. Range 11/27/2018 01:27 11/27/2018 04:49 11/27/2018 06:48 11/27/2018 07:36  Glucose-Capillary Latest Ref Range: 70 - 99 mg/dL 712 (H) 458 (H) 099 (H) 248 (H)   Results for DINESH, GASSETT (MRN [id number]) as of 11/27/2018 08:07  Ref. Range 11/26/2018 07:34 11/26/2018 11:32 11/26/2018 15:22 11/26/2018 16:45 11/26/2018 17:30 11/26/2018 22:00  Glucose-Capillary Latest Ref Range: 70 - 99 mg/dL 976 (H) 734 (H) 193 (H) 97 103 (H) 314 (H)   Review of Glycemic Control Diabetes history: DM2 Outpatient Diabetes medications: 630G Medtronic insulin pump with Novolog Current orders for Inpatient glycemic control: Insulin Pump Q4H  Inpatient Diabetes Program Recommendations:   HgbA1C: A1C 11.5% on 09/15/2018 (in Care Everywhere). Noted patient is followed by [facility] Endocr[facility]ology and seen M. Younts, ANP on 09/15/18.  NOTE: In reviewing chart, noted patient received Decadron 5 mg at 16:00 on 11/26/18 which is likely cause of noted hyperglycemia. Anticipate glucose to improve since no other steroids ordered. Will continue to follow.  Thanks, Orlando Penner, RN, MSN, CDE Diabetes Coordinator Inpatient Diabetes Program [phone] (Team Pager from 8am to 5pm)

## 2018-11-27 NOTE — Consult Note (Signed)
NAME: Richard Mendoza  DOB: 12/21/1975  MRN: [id number]  Date/Time: 11/27/2018 12:41 PM  [place] Subjective:  REASON FOR CONSULT: Right foot infection ? Richard Mendoza is a 43 y.o. male with a history of DM, MRSA skin and soft tissue infection presents with a blistering infection on the right foot of 3 weeks duration.  Patient has had callus on both his feet on the lateral aspect near the fifth met head on the plantar surface for many months.  She he goes to the podiatrist regularly.  3 weeks ago he noted a blister.  And he broke the blister and realized that it was having some purulent discharge.  Initially was managing it at home and then went to see his podiatrist last week who asked him to go to the ED as the foot was swollen and erythematous.  Patient states he has had multiple infections on his body that have been caused by MRSA in the past. Patient underwent MRI on 1/ 7 and then had I&D of the abscess on the right foot by Dr. Vickki Muff on 11/26/2018.  I am asked to see the patient for the infection and management of antibiotics.  He is currently on vancomycin and cefepime. Past Medical History:  Diagnosis Date  . DIABETES MELLITUS, TYPE II, UNCONTROLLED 03/17/2009  . DM 12/08/2008  . HYPERLIPIDEMIA 03/17/2009  . HYPERTENSION 12/08/2008  . 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 ANTERIOR CERVICAL DECOMP/DISCECTOMY FUSION N/A 09/09/2017   Procedure: ANTERIOR CERVICAL DECOMPRESSION/DISCECTOMY FUSION CERVICAL 6- CERVICAL 7;  Surgeon: Ashok Pall, MD;  Location: [place];  Service: Neurosurgery;  Laterality: N/A;  ANTERIOR CERVICAL DECOMPRESSION/DISCECTOMY FUSION CERVICAL 6- CERVICAL 7  . APPENDECTOMY    . I&D EXTREMITY Right 10/03/2017   Procedure: IRRIGATION AND DEBRIDEMENT RIGHT WRIST;  Surgeon: Leanora Cover, MD;  Location: [place];  Service: Orthopedics;  Laterality: Right;  . I&D EXTREMITY Right 11/26/2018   Procedure: IRRIGATION AND DEBRIDEMENT FASCIA ON RIGHT  FOOT;  Surgeon: Samara Deist, DPM;  Location: [facility];  Service: Podiatry;  Laterality: Right;  . osteomylitis    . ROTATOR CUFF REPAIR Left      Family History  Problem Relation Age of Onset  . Diabetes Mother   . Heart disease Father   . Diabetes Father   . Arthritis Other   . Hyperlipidemia Other   . Hypertension Other   . Cancer Other        breast   No Known Allergies  Social history Lives with his wife Owns a Architect business Smoker No illicit drug use or IV drug use ? Current Facility-Administered Medications  Medication Dose Route Frequency Provider Last Rate Last Dose  . 0.9 %  sodium chloride infusion   Intravenous Continuous Samara Deist, DPM 75 mL/hr at 11/26/18 2259    . acetaminophen (TYLENOL) tablet 650 mg  650 mg Oral Q6H PRN Samara Deist, DPM   650 mg at 11/26/18 3557   Or  . acetaminophen (TYLENOL) suppository 650 mg  650 mg Rectal Q6H PRN Samara Deist, DPM      . atorvastatin (LIPITOR) tablet 20 mg  20 mg Oral Daily Samara Deist, DPM   20 mg at 11/27/18 0853  . busPIRone (BUSPAR) tablet 10 mg  10 mg Oral BID Samara Deist, DPM   10 mg at 11/27/18 0856  . ceFEPIme (MAXIPIME) 2 g in sodium chloride 0.9 % 100 mL IVPB  2 g Intravenous Q12H Samara Deist, DPM 200 mL/hr  at 11/27/18 1054 2 g at 11/27/18 1054  . Chlorhexidine Gluconate Cloth 2 % PADS 6 each  6 each Topical Q0600 Samara Deist, DPM   6 each at 11/27/18 0541  . dimenhyDRINATE (DRAMAMINE) tablet 50 mg  50 mg Oral PRN Samara Deist, DPM      . enoxaparin (LOVENOX) injection 40 mg  40 mg Subcutaneous Q24H Samara Deist, DPM   40 mg at 11/25/18 1702  . famotidine (PEPCID) tablet 20 mg  20 mg Oral BID Samara Deist, DPM   20 mg at 11/27/18 0853  . gabapentin (NEURONTIN) capsule 600 mg  600 mg Oral QHS Samara Deist, DPM   600 mg at 11/26/18 2245  . HYDROcodone-acetaminophen (NORCO/VICODIN) 5-325 MG per tablet 1 tablet  1 tablet Oral Q4H PRN Pyreddy, Reatha Harps, MD      .  HYDROcodone-acetaminophen (NORCO/VICODIN) 5-325 MG per tablet 1-2 tablet  1-2 tablet Oral Q6H PRN Samara Deist, DPM   2 tablet at 11/26/18 1808  . insulin pump   Subcutaneous Q4H Samara Deist, DPM   5.8 each at 11/27/18 [phone]  . lisinopril (PRINIVIL,ZESTRIL) tablet 5 mg  5 mg Oral Daily Samara Deist, DPM   5 mg at 11/27/18 0853  . metoCLOPramide (REGLAN) tablet 10 mg  10 mg Oral BID Samara Deist, DPM   10 mg at 11/27/18 0853  . morphine 2 MG/ML injection 2 mg  2 mg Intravenous Q4H PRN Samara Deist, DPM   2 mg at 11/27/18 1055  . mupirocin ointment (BACTROBAN) 2 % 1 application  1 application Nasal BID Samara Deist, DPM   1 application at 52/84/13 0856  . nicotine (NICODERM CQ - dosed in mg/24 hours) patch 21 mg  21 mg Transdermal Daily Samara Deist, DPM   21 mg at 11/27/18 0853  . ondansetron (ZOFRAN) tablet 4 mg  4 mg Oral Q6H PRN Samara Deist, DPM       Or  . ondansetron [facility]) injection 4 mg  4 mg Intravenous Q6H PRN Samara Deist, DPM   4 mg at 11/26/18 1617  . oxyCODONE (Oxy IR/ROXICODONE) immediate release tablet 5 mg  5 mg Oral Q6H PRN Samara Deist, DPM      . pantoprazole (PROTONIX) EC tablet 40 mg  40 mg Oral Daily Samara Deist, DPM   40 mg at 11/27/18 0853  . senna (SENOKOT) tablet 8.6 mg  1 tablet Oral BID Samara Deist, DPM   8.6 mg at 11/27/18 0853  . sertraline (ZOLOFT) tablet 100 mg  100 mg Oral Daily Samara Deist, DPM   100 mg at 11/27/18 0854  . tiZANidine (ZANAFLEX) tablet 4 mg  4 mg Oral Q6H PRN Samara Deist, DPM      . vancomycin (VANCOCIN) 1,500 mg in sodium chloride 0.9 % 500 mL IVPB  1,500 mg Intravenous Q12H Samara Deist, DPM 250 mL/hr at 11/27/18 0542 1,500 mg at 11/27/18 0542     Abtx:  Anti-infectives (From admission, onward)   Start     Dose/Rate Route Frequency Ordered Stop   11/26/18 0600  vancomycin (VANCOCIN) 1,500 mg in sodium chloride 0.9 % 500 mL IVPB     1,500 mg 250 mL/hr over 120 Minutes Intravenous Every 12 hours 11/25/18 1709      11/25/18 1800  ceFEPIme (MAXIPIME) 2 g in sodium chloride 0.9 % 100 mL IVPB     2 g 200 mL/hr over 30 Minutes Intravenous Every 12 hours 11/25/18 1709     11/25/18 1800  vancomycin (VANCOCIN) 2,000 mg in sodium  chloride 0.9 % 500 mL IVPB     2,000 mg 250 mL/hr over 120 Minutes Intravenous  Once 11/25/18 1709 11/25/18 2215      REVIEW OF SYSTEMS:  Const: Subjective fever,  chills, negative weight loss Eyes: negative diplopia or visual changes, negative eye pain ENT: negative coryza, negative sore throat Resp: negative cough, hemoptysis, dyspnea Cards: negative for chest pain, palpitations, lower extremity edema GU: negative for frequency, dysuria and hematuria GI: Negative for abdominal pain, diarrhea, bleeding, constipation Skin: negative for rash and pruritus Heme: negative for easy bruising and gum/nose bleeding MS: negative for myalgias, arthralgias, back pain and muscle weakness Neurolo:negative for headaches, dizziness, vertigo, memory problems  Psych: negative for feelings of anxiety, depression  Endocrine: Has polyuria Allergy/Immunology- negative for any medication or food allergies ? Objective:  VITALS:  BP 130/78 (BP Location: Right Arm)   Pulse 71   Temp 97.9 F (36.6 C) (Oral)   Resp 18   Ht 5' 10"  (1.778 m)   Wt 95.3 kg   SpO2 100%   BMI 30.13 kg/m  PHYSICAL EXAM:  General: Alert, cooperative, no distress, appears stated age.  Head: Normocephalic, without obvious abnormality, atraumatic. Eyes: Conjunctivae clear, anicteric sclerae. Pupils are equal ENT Nares normal. No drainage or sinus tenderness. Lips, mucosa, and tongue normal. No Thrush Neck: Supple, symmetrical, no adenopathy, thyroid: non tender no carotid bruit and no JVD. Back: No CVA tenderness. Lungs: Clear to auscultation bilaterally. No Wheezing or Rhonchi. No rales. Heart: Regular rate and rhythm, no murmur, rub or gallop. Abdomen: Soft, non-tender,not distended. Bowel sounds normal. No  masses, insulin pump on the right side Extremities: Right foot plantar aspect there is a surgical incision with sutures and there is an opening  Which is draining old blood.  There is some superficial necrosis at the distal end of the incision site. Surrounding erythema and swelling is present.       Left foot on the plantar aspect on the lateral side of the fifth met head there is very small callus  Skin: No rashes or lesions. Or bruising Lymph: Cervical, supraclavicular normal. Neurologic: Grossly non-focal Pertinent Labs Lab Results CBC    Component Value Date/Time   WBC 11.8 (H) 11/26/2018 0331   RBC 3.78 (L) 11/26/2018 0331   HGB 10.5 (L) 11/26/2018 0331   HCT 32.3 (L) 11/26/2018 0331   PLT 223 11/26/2018 0331   MCV 85.4 11/26/2018 0331   MCH 27.8 11/26/2018 0331   MCHC 32.5 11/26/2018 0331   RDW 13.8 11/26/2018 0331   LYMPHSABS 3.4 08/01/2011 0124   MONOABS 0.7 08/01/2011 0124   EOSABS 0.2 08/01/2011 0124   BASOSABS 0.0 08/01/2011 0124    CMP Latest Ref Rng & Units 11/26/2018 11/26/2018 11/25/2018  Glucose 70 - 99 mg/dL - 185(H) 274(H)  BUN 6 - 20 mg/dL - 14 14  Creatinine 0.61 - 1.24 mg/dL - 1.10 1.15  Sodium 135 - 145 mmol/L - 135 134(L)  Potassium 3.5 - 5.1 mmol/L 3.6 2.8(L) 3.1(L)  Chloride 98 - 111 mmol/L - 105 103  CO2 22 - 32 mmol/L - 22 22  Calcium 8.9 - 10.3 mg/dL - 8.5(L) 9.0  Total Protein 6.5 - 8.1 g/dL - - 7.4  Total Bilirubin 0.3 - 1.2 mg/dL - - 0.7  Alkaline Phos 38 - 126 U/L - - 117  AST 15 - 41 U/L - - 16  ALT 0 - 44 U/L - - 13      Microbiology: Recent Results (from the  past 240 hour(s))  MRSA PCR Screening     Status: Abnormal   Collection Time: 11/26/18  1:20 AM  Result Value Ref Range Status   MRSA by PCR POSITIVE (A) NEGATIVE Final    Comment:        The GeneXpert MRSA Assay (FDA approved for NASAL specimens only), is one component of a comprehensive MRSA colonization surveillance program. It is not intended to diagnose  MRSA infection nor to guide or monitor treatment for MRSA infections. RESULT CALLED TO, READ BACK BY AND VERIFIED WITH: STACY CLAY @0350  11/26/18 AKT Performed at [facility], [address] [place], [place] [address]   Aerobic/Anaerobic Culture (surgical/deep wound)     Status: None (Preliminary result)   Collection Time: 11/26/18  3:53 PM  Result Value Ref Range Status   Specimen Description   Final    WOUND Performed at [facility], [address], [place], [place] [address]    Special Requests   Final    NONE Performed at [facility], [address] [place], [place] [address]    Gram Stain   Final    FEW WBC PRESENT, PREDOMINANTLY PMN RARE GRAM POSITIVE COCCI    Culture   Final    CULTURE REINCUBATED FOR BETTER GROWTH Performed at [facility], [place] [address], [place], [place] [address]    Report Status PENDING  Incomplete   IMAGING RESULTS: ?MRI reviewed personally Superficial plantar ulcerations along the lateral aspect of the forefoot at the level of the fourth and fifth metatarsal heads. No adjacent marrow signal abnormality to suggest acute osteomyelitis. 2. Forefoot cellulitis with diffuse soft tissue edema is noted without drainable fluid collection or abscess. 3. Calcaneal enthesopathy   Impression/Recommendation 43 y.o. male with a history of DM, MRSA skin and soft tissue infection presents with a blistering infection on the right foot of 3 weeks duration.  ? ? ?Right foot infection: Status post IND.  Cultures have been sent.  Currently on Vanco and cefepime.  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 As he has MRSA in his nares and if the cultures turn positive for MRSA from the foot and he will have to be decolonized once he finishes his treatment.  The duration of antibiotic and the selection of antibiotic and route will all depend on the culture result as well as how he is progressing.  Diabetes mellitus  on insulin  __Hypertension on lisinopril Hyperlipidemia on atorvastatin Depression on BuSpar and sertraline  Patient seen with Dr. Vickki Muff _______________________________________________ Discussed with patient, Note:  This document was prepared using Dragon voice recognition software and may include unintentional dictation errors.

## 2018-11-27 NOTE — Progress Notes (Signed)
[facility] - [place] [facility]   PATIENT NAME: Richard Mendoza    MR#:  [id number]  DATE OF BIRTH:  1976-03-20  SUBJECTIVE:   patient had incision and drainage of the foot yesterday.  Pain is okay Seen by podiatry today  REVIEW OF SYSTEMS:    Review of Systems  Constitutional: Negative for fever, chills weight loss HENT: Negative for ear pain, nosebleeds, congestion, facial swelling, rhinorrhea, neck pain, neck stiffness and ear discharge.   Respiratory: Negative for cough, shortness of breath, wheezing  Cardiovascular: Negative for chest pain, palpitations and leg swelling.  Gastrointestinal: Negative for heartburn, abdominal pain, vomiting, diarrhea or consitpation Genitourinary: Negative for dysuria, urgency, frequency, hematuria Musculoskeletal: Negative for back pain or joint pain Neurological: Negative for dizziness, seizures, syncope, focal weakness,  numbness and headaches.  Hematological: Does not bruise/bleed easily.  Psychiatric/Behavioral: Negative for hallucinations, confusion, dysphoric mood  Foot infection/abscess  Tolerating Diet: N.p.o.      DRUG ALLERGIES:  No Known Allergies  VITALS:  Blood pressure 130/78, pulse 71, temperature 97.9 F (36.6 C), temperature source Oral, resp. rate 18, height 5\' 10"  (1.778 m), weight 95.3 kg, SpO2 100 %.  PHYSICAL EXAMINATION:  Constitutional: Appears well-developed and well-nourished. No distress. HENT: Normocephalic. Marland Kitchen Oropharynx is clear and moist.  Eyes: Conjunctivae and EOM are normal. PERRLA, no scleral icterus.  Neck: Normal ROM. Neck supple. No JVD. No tracheal deviation. CVS: RRR, S1/S2 +, no murmurs, no gallops, no carotid bruit.  Pulmonary: Effort and breath sounds normal, no stridor, rhonchi, wheezes, rales.  Abdominal: Soft. BS +,  no distension, tenderness, rebound or guarding.  Musculoskeletal: Normal range of motion. No edema and no tenderness.  Neuro: Alert. CN 2-12 grossly  intact. No focal deficits. Skin: Diffuse edema right foot with clean dressing  psychiatric: Normal mood and affect.      LABORATORY PANEL:   CBC Recent Labs  Lab 11/26/18 0331  WBC 11.8*  HGB 10.5*  HCT 32.3*  PLT 223   ------------------------------------------------------------------------------------------------------------------  Chemistries  Recent Labs  Lab 11/25/18 1621 11/26/18 0331 11/26/18 1339  NA 134* 135  --   K 3.1* 2.8* 3.6  CL 103 105  --   CO2 22 22  --   GLUCOSE 274* 185*  --   BUN 14 14  --   CREATININE 1.15 1.10  --   CALCIUM 9.0 8.5*  --   MG  --  1.7  --   AST 16  --   --   ALT 13  --   --   ALKPHOS 117  --   --   BILITOT 0.7  --   --    ------------------------------------------------------------------------------------------------------------------  Cardiac Enzymes No results for input(s): TROPONINI in the last 168 hours. ------------------------------------------------------------------------------------------------------------------  RADIOLOGY:  Mr Foot Right W Wo Contrast  Result Date: 11/25/2018 CLINICAL DATA:  Small ulcerations along the plantar aspect of the right foot. EXAM: MRI OF THE RIGHT FOREFOOT WITHOUT AND WITH CONTRAST TECHNIQUE: Multiplanar, multisequence MR imaging of the right foot was performed before and after the administration of intravenous contrast. CONTRAST:  9.5 mL Gadavist COMPARISON:  None. FINDINGS: Bones/Joint/Cartilage 1. Distal tibia and fibula: Intact without marrow signal abnormality. 2. Ankle and subtalar joints: Intact with trace ankle joint fluid more likely physiologic. No capsular bulging is noted the lateral images through the ankle. 3. Talus: Normal in signal intensity and morphology. No suspicious osseous lesion or fracture. 4. Calcaneus: Enthesopathy along the plantar dorsal aspect. No marrow signal abnormality.  5. Midfoot: No acute marrow signal abnormality, fracture or joint dislocation. 6. Forefoot:  No marrow signal abnormality, fracture or findings suspicious for osteomyelitis. Ligaments Negative Muscles and Tendons No evidence of pyomyositis. No muscle atrophy. No tenosynovitis. The tendons crossing the ankle and foot are of normal signal intensity morphology without disruption or tear. Mild thickening and tendinosis of the distal Achilles near the insertion on the calcaneus. Soft tissues Subcutaneous soft tissue edema surrounding the forefoot more so dorsally. Soft tissue ulcerations are identified along the plantar aspect of the forefoot at the level of the fourth and fifth metatarsal heads with soft tissue induration and probable granulation tissue. No drainable fluid collection or abscess is identified. IMPRESSION: 1. Superficial plantar ulcerations along the lateral aspect of the forefoot at the level of the fourth and fifth metatarsal heads. No adjacent marrow signal abnormality to suggest acute osteomyelitis. 2. Forefoot cellulitis with diffuse soft tissue edema is noted without drainable fluid collection or abscess. 3. Calcaneal enthesopathy. Electronically Signed   By: Tollie Eth M.D.   On: 11/25/2018 22:17     ASSESSMENT AND PLAN:    43 year old male with history of diabetes and essential hypertension who is referred by podiatry for right foot abscess.  1.  Right foot abscess: Status post I&D  postop day #1 ID consultation placed and  Dr. Rudene Anda will see the patient Continue cefepime and vancomycin Follow-up with podiatry, recommending nonweightbearing with physical therapy Cultures pending    2. Uncontrolled diabetes with A1c of 11.5 in October: Continue current management recommendations by diabetes nurse.  3.  Essential hypertension: Continue lisinopril  4.Tobacco dependence: Patient is encouraged to quit smoking. Counseling was provided for 4 minutes. He has a nicotine patch placed   5.  Depression: Continue Zoloft and BuSpar  6.  Hyperlipidemia: Continue  statin  7.  Recurrent/ persistent hypokalemia: Despite outpatient over-the-counter potassium supplements replete Consult placed to nephrology per family request, message sent to Dr. Thedore Mins via secure epic chart Dietitian consult for potassium rich diet  Management plans discussed with the patient and he is in agreement.  CODE STATUS: FULL  TOTAL TIME TAKING CARE OF THIS PATIENT: 35 minutes.     POSSIBLE D/C 2 days, DEPENDING ON CLINICAL CONDITION.   Ramonita Lab M.D on 11/27/2018 at 2:03 PM  Between 7am to 6pm - Pager - [phone] After 6pm go to [email or web address] - password [email or web address]  [facility]  Office  [phone]  CC: Primary care physician; Raynelle Bring  Note: This dictation was prepared with Dragon dictation along with smaller phrase technology. Any transcriptional errors that result from this process are unintentional.

## 2018-11-27 NOTE — Progress Notes (Signed)
Daily Progress Note   Subjective  - 1 Day Post-Op  F/u I & D  Objective Vitals:   11/26/18 1900 11/26/18 2018 11/27/18 0514 11/27/18 1139  BP: 92/69 114/76 133/85 130/78  Pulse: 84 84 76 71  Resp: 20 20 20 18   Temp: 98.2 F (36.8 C) 98.5 F (36.9 C) 97.9 F (36.6 C) 97.9 F (36.6 C)  TempSrc: Oral Oral Oral Oral  SpO2: 100% 99% 100% 100%  Weight:      Height:        Physical Exam: Wound with bloody drainage.  No puruelence  Laboratory CBC    Component Value Date/Time   WBC 11.8 (H) 11/26/2018 0331   HGB 10.5 (L) 11/26/2018 0331   HCT 32.3 (L) 11/26/2018 0331   PLT 223 11/26/2018 0331    BMET    Component Value Date/Time   NA 135 11/26/2018 0331   K 3.6 11/26/2018 1339   CL 105 11/26/2018 0331   CO2 22 11/26/2018 0331   GLUCOSE 185 (H) 11/26/2018 0331   BUN 14 11/26/2018 0331   CREATININE 1.10 11/26/2018 0331   CALCIUM 8.5 (L) 11/26/2018 0331   GFRNONAA >60 11/26/2018 0331   GFRAA >60 11/26/2018 0331    Assessment/Planning: Dressing changed today   Begin NWB with PT  Awaiting cultures  Will f/u tomorrow.    Gwyneth Revels A  11/27/2018, 12:55 PM

## 2018-11-27 NOTE — Evaluation (Signed)
Physical Therapy Evaluation Patient Details Name: Richard Mendoza MRN: [id number] DOB: 07/20/1976 Today's Date: 11/27/2018   History of Present Illness  43 y/o male who has had R foot wound for a few months, now is s/p I&D of abcess and is non-WBing on R foot.    Clinical Impression  Pt did well with mobility and showed good confidence with standing balance w/ AD and even w/o AD.  We did discuss a knee scooter and this would make things easier for him both in the house and with his job related needs.  He showed good safety with minimal cuing for swing-through gait using crutches and overall did not show need for further PT intervention.  We did not trail steps today (pt in isolation room) but given his strength and mobility and education on multiple strategies he should have no problem safely negotiating them as needed.  No further PT needs, will sign off.    Follow Up Recommendations No PT follow up    Equipment Recommendations  Crutches(knee scooter)    Recommendations for Other Services       Precautions / Restrictions Restrictions Weight Bearing Restrictions: Yes RUE Weight Bearing: Non weight bearing Other Position/Activity Restrictions: Pt reports that he has put some weight through the heel, but does understand that he should do his best to keep weight off R      Mobility  Bed Mobility Overal bed mobility: Independent             General bed mobility comments: Pt easily rises to standing on one leg with and w/o AD  Transfers Overall transfer level: Independent Equipment used: Crutches             General transfer comment: with minimal cuing pt was able to safely and effectively rise and manipulate crutches while maintaining WBing on R.  Good overall safey and confidence  Ambulation/Gait Ambulation/Gait assistance: Independent Gait Distance (Feet): 70 Feet Assistive device: Crutches       General Gait Details: Pt did well with swing-through gait using  crutches and easily maintained NWBing.  He showed good overall safety and should be safe to retrurn home once medically cleared for d/c.  Minimal cuing/edu for appropriate set up, usage in various situations and answered all mobility elated questions (including discussion of knee scooter).  Stairs Stairs: (educated on different strategies, pt voices good understandi)          Engineer, drilling Rankin (Stroke Patients Only)       Balance Overall balance assessment: Independent                                           Pertinent Vitals/Pain Pain Assessment: 0-10 Pain Score: 5  Pain Location: reports it has been a little while since meds, starting to hurt/throb    Home Living Family/patient expects to be discharged to:: Private residence Living Arrangements: Spouse/significant other;Children     Home Access: Stairs to enter Entrance Stairs-Rails: Right;Left(wide) Secretary/administrator of Steps: 3 Home Layout: One level Home Equipment: None      Prior Function Level of Independence: Independent         Comments: Pt very independent and active, works as Holiday representative        Extremity/Trunk Assessment   Upper Extremity Assessment Upper Extremity Assessment: Overall WFL for tasks assessed(h/o  recent R wrist infection, but has full function)    Lower Extremity Assessment Lower Extremity Assessment: Overall WFL for tasks assessed       Communication   Communication: No difficulties  Cognition Arousal/Alertness: Awake/alert Behavior During Therapy: WFL for tasks assessed/performed Overall Cognitive Status: Within Functional Limits for tasks assessed                                        General Comments      Exercises     Assessment/Plan    PT Assessment Patent does not need any further PT services  PT Problem List         PT Treatment Interventions      PT Goals (Current goals  can be found in the Care Plan section)  Acute Rehab PT Goals Patient Stated Goal: go home and get back to being active PT Goal Formulation: All assessment and education complete, DC therapy    Frequency     Barriers to discharge        Co-evaluation               AM-PAC PT "6 Clicks" Mobility  Outcome Measure Help needed turning from your back to your side while in a flat bed without using bedrails?: None Help needed moving from lying on your back to sitting on the side of a flat bed without using bedrails?: None Help needed moving to and from a bed to a chair (including a wheelchair)?: None Help needed standing up from a chair using your arms (e.g., wheelchair or bedside chair)?: None Help needed to walk in hospital room?: None Help needed climbing 3-5 steps with a railing? : None 6 Click Score: 24    End of Session Equipment Utilized During Treatment: Gait belt Activity Tolerance: Patient tolerated treatment well Patient left: in chair;with call bell/phone within reach Nurse Communication: Mobility status PT Visit Diagnosis: Difficulty in walking, not elsewhere classified (R26.2)    Time: 1561-5379 PT Time Calculation (min) (ACUTE ONLY): 23 min   Charges:   PT Evaluation $PT Eval Low Complexity: 1 Low PT Treatments $Gait Training: 8-22 mins        Malachi Pro, DPT 11/27/2018, 3:54 PM

## 2018-11-28 ENCOUNTER — Inpatient Hospital Stay: Payer: Self-pay

## 2018-11-28 DIAGNOSIS — B9561 Methicillin susceptible Staphylococcus aureus infection as the cause of diseases classified elsewhere: Secondary | ICD-10-CM

## 2018-11-28 LAB — CREATININE, SERUM
Creatinine, Ser: 0.98 mg/dL (ref 0.61–1.24)
GFR calc Af Amer: >60 mL/min (ref >60)
GFR calc non Af Amer: >60 mL/min (ref >60)

## 2018-11-28 LAB — GLUCOSE, CAPILLARY
GLUCOSE-CAPILLARY: 148 mg/dL — AB (ref 70–99)
GLUCOSE-CAPILLARY: 162 mg/dL — AB (ref 70–99)
Glucose-Capillary: 162 mg/dL — ABNORMAL HIGH (ref 70–99)
Glucose-Capillary: 151 mg/dL — ABNORMAL HIGH (ref 70–99)
Glucose-Capillary: 186 mg/dL — ABNORMAL HIGH (ref 70–99)
Glucose-Capillary: 190 mg/dL — ABNORMAL HIGH (ref 70–99)

## 2018-11-28 LAB — CBC
HEMATOCRIT: 30.5 % — AB (ref 39.0–52.0)
Hemoglobin: 9.7 g/dL — ABNORMAL LOW (ref 13.0–17.0)
MCH: 27.7 pg (ref 26.0–34.0)
MCHC: 31.8 g/dL (ref 30.0–36.0)
MCV: 87.1 fL (ref 80.0–100.0)
Platelets: 212 10*3/uL (ref 150–400)
RBC: 3.5 MIL/uL — ABNORMAL LOW (ref 4.22–5.81)
RDW: 14.1 % (ref 11.5–15.5)
WBC: 10.2 10*3/uL (ref 4.0–10.5)
nRBC: 0 % (ref 0.0–0.2)

## 2018-11-28 LAB — URINALYSIS, ROUTINE W REFLEX MICROSCOPIC
Bacteria, UA: NONE SEEN
Bilirubin Urine: NEGATIVE
Glucose, UA: 150 mg/dL — AB
Ketones, ur: NEGATIVE mg/dL
Leukocytes, UA: NEGATIVE
Nitrite: NEGATIVE
Protein, ur: NEGATIVE mg/dL
SPECIFIC GRAVITY, URINE: 1.009 (ref 1.005–1.030)
pH: 6 (ref 5.0–8.0)

## 2018-11-28 LAB — VANCOMYCIN, PEAK
Vancomycin Pk: 22 ug/mL — ABNORMAL LOW (ref 30–40)
Vancomycin Pk: 21 ug/mL — ABNORMAL LOW (ref 30–40)
Vancomycin Pk: 24 ug/mL — ABNORMAL LOW (ref 30–40)

## 2018-11-28 LAB — PROTEIN / CREATININE RATIO, URINE
Creatinine, Urine: 31 mg/dL
Total Protein, Urine: <6 mg/dL

## 2018-11-28 LAB — SURGICAL PATHOLOGY

## 2018-11-28 LAB — POTASSIUM: Potassium: 3.4 mmol/L — ABNORMAL LOW (ref 3.5–5.1)

## 2018-11-28 MED ORDER — AMILORIDE HCL 5 MG PO TABS
5.0000 mg | ORAL_TABLET | Freq: Every day | ORAL | Status: DC
  Administered 2018-11-28 – 2018-11-29 (×2): 5 mg via ORAL
  Filled 2018-11-28 (×2): qty 1

## 2018-11-28 MED ORDER — BUTALBITAL-APAP-CAFFEINE 50-325-40 MG PO TABS
1.0000 | ORAL_TABLET | Freq: Four times a day (QID) | ORAL | Status: DC | PRN
  Administered 2018-11-28: 1 via ORAL
  Filled 2018-11-28: qty 1

## 2018-11-28 MED ORDER — BUTALBITAL-APAP-CAFFEINE 50-325-40 MG PO TABS: 1.0000 | ORAL_TABLET | Freq: Four times a day (QID) | ORAL | Status: DC | PRN

## 2018-11-28 MED ORDER — SODIUM CHLORIDE 0.9% FLUSH
10.0000 mL | Freq: Two times a day (BID) | INTRAVENOUS | Status: DC
  Administered 2018-11-28 – 2018-11-29 (×2): 10 mL

## 2018-11-28 MED ORDER — SODIUM CHLORIDE 0.9% FLUSH: 10.0000 mL | INTRAVENOUS | Status: DC | PRN

## 2018-11-28 MED ORDER — POTASSIUM CHLORIDE 20 MEQ PO PACK
20.0000 meq | PACK | Freq: Every day | ORAL | Status: DC
  Administered 2018-11-28: 20 meq via ORAL
  Filled 2018-11-28 (×2): qty 1

## 2018-11-28 NOTE — Progress Notes (Signed)
Date of Admission:  11/25/2018       Subjective: Doing better Less foot pain  Medications:  . aMILoride  5 mg Oral Daily  . atorvastatin  20 mg Oral Daily  . busPIRone  10 mg Oral BID  . Chlorhexidine Gluconate Cloth  6 each Topical Q0600  . enoxaparin (LOVENOX) injection  40 mg Subcutaneous Q24H  . famotidine  20 mg Oral BID  . gabapentin  600 mg Oral QHS  . insulin pump   Subcutaneous Q4H  . lisinopril  5 mg Oral Daily  . metoCLOPramide  10 mg Oral BID  . mupirocin ointment  1 application Nasal BID  . nicotine  21 mg Transdermal Daily  . pantoprazole  40 mg Oral Daily  . potassium chloride  20 mEq Oral Daily  . senna  1 tablet Oral BID  . sertraline  100 mg Oral Daily    Objective: Vital signs in last 24 hours: Temp:  [97.8 F (36.6 C)-98.4 F (36.9 C)] 98.4 F (36.9 C) (01/10 1225) Pulse Rate:  [67-80] 67 (01/10 1225) Resp:  [18-20] 18 (01/10 1225) BP: (137-173)/(83-99) 139/87 (01/10 1225) SpO2:  [98 %-100 %] 100 % (01/10 1225)  PHYSICAL EXAM:  General: Alert, cooperative, no distress, appears stated age.  Head: Normocephalic, without obvious abnormality, atraumatic. Eyes: Conjunctivae clear, anicteric sclerae. Pupils are equal ENT Nares normal. No drainage or sinus tenderness. Lips, mucosa, and tongue normal. No Thrush Neck: Supple, symmetrical, no adenopathy, thyroid: non tender no carotid bruit and no JVD. Back: No CVA tenderness. Lungs: Clear to auscultation bilaterally. No Wheezing or Rhonchi. No rales. Heart: Regular rate and rhythm, no murmur, rub or gallop. Abdomen: Soft, non-tender,not distended. Bowel sounds normal. No masses Extremities: rt foot-less swollen and less red 11/28/18    11/27/18     Skin: No rashes or lesions. Or bruising Lymph: Cervical, supraclavicular normal. Neurologic: Grossly non-focal  Lab Results Recent Labs    11/25/18 1621 11/26/18 0331 11/26/18 1339 11/28/18 0627 11/28/18 1059  WBC 13.4* 11.8*  --  10.2  --    HGB 11.7* 10.5*  --  9.7*  --   HCT 35.3* 32.3*  --  30.5*  --   NA 134* 135  --   --   --   K 3.1* 2.8* 3.6 3.4*  --   CL 103 105  --   --   --   CO2 22 22  --   --   --   BUN 14 14  --   --   --   CREATININE 1.15 1.10  --   --  0.98   Liver Panel Recent Labs    11/25/18 1621  PROT 7.4  ALBUMIN 3.4*  AST 16  ALT 13  ALKPHOS 117  BILITOT 0.7   Sedimentation Rate No results for input(s): ESRSEDRATE in the last 72 hours. C-Reactive Protein No results for input(s): CRP in the last 72 hours.  Microbiology: Wound culture- staph aureus- susceptibility pending Studies/Results: No results found.   Assessment/Plan: 43 y.o. male with a history of DM, MRSA skin and soft tissue infection presents with a blistering infection on the right foot of 3 weeks duration.  ? ? ?Right foot infection: Status post I/D.  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  As he has MRSA in his nares and if the cultures turn positive for MRSA from the foot . he will have to be decolonized once he finishes his treatment.    Diabetes mellitus on insulin  __Hypertension on lisinopril Hyperlipidemia on atorvastatin Depression on BuSpar and sertraline  Patient seen with Dr. Ether Griffins

## 2018-11-28 NOTE — Consult Note (Signed)
Date: 11/28/2018                  Patient Name:  Richard Mendoza  MRN: [id number]  DOB: 13-Aug-1976  Age / Sex: 43 y.o., male         PCP: Raynelle Bring                 Service Requesting Consult: IM/ Ramonita Lab, MD                 Reason for Consult: Hypokalemia            History of Present Illness: Patient is a 43 y.o. male with medical problems of poorly controlled diabetes for the past 15 years, complicated by neuropathy, right foot ulcer, hypertension, who was admitted to [facility] on 11/25/2018 for evaluation of right foot infection.  Foot pain has worsened over the last few days.  Admitted for IV antibiotics and planned irrigation and debridement of right foot wound.  Patient underwent the procedure on January 8.  Purulent drainage was noted and cultures obtained. Nephrology consult has been requested for hypokalemia.  Patient reports he has had this problem for many years.  He manages it with over-the-counter potassium supplements.  Most recent potassium today was 3.4.  Serum creatinine is in the normal range.  Additionally, patient reports that he drinks large amounts of iced tea and sodas, contributing to worsening of his diabetes.  Medications: Outpatient medications: Medications Prior to Admission  Medication Sig Dispense Refill Last Dose  . acetaminophen (TYLENOL) 500 MG tablet Take 500-1,000 mg by mouth every 4 (four) hours as needed for mild pain or moderate pain.   Unknown at PRN  . atorvastatin (LIPITOR) 20 MG tablet Take 20 mg by mouth daily.  3 11/25/2018 at 0800  . busPIRone (BUSPAR) 10 MG tablet Take 10 mg by mouth 2 (two) times daily.   11/25/2018 at 0800  . [facility] SENNA 8.6 MG tablet Take 1 tablet by mouth 2 (two) times daily.   5 11/25/2018 at 0800  . dimenhyDRINATE (DRAMAMINE) 50 MG tablet Take 50 mg by mouth 2 (two) times daily as needed for itching or dizziness.    Unknown at PRN  . famotidine (PEPCID) 20 MG tablet Take 20 mg by mouth 2 (two) times daily.   11/25/2018  at 0800  . gabapentin (NEURONTIN) 300 MG capsule Take 600 mg by mouth at bedtime.    11/24/2018 at 2100  . ibuprofen (ADVIL,MOTRIN) 200 MG tablet Take 400-600 mg by mouth every 4 (four) hours as needed for mild pain or moderate pain.   Unknown at PRN  . insulin aspart (NOVOLOG) 100 UNIT/ML injection Inject into the skin 3 (three) times daily before meals. Via pump   As directed at As directed  . Insulin Human (INSULIN PUMP) SOLN Inject 2.5 each into the skin every hour.   11/25/2018 at Unknown time  . linaclotide (LINZESS) 145 MCG CAPS capsule Take 72 mcg by mouth daily as needed (GI symptoms).    Unknown at PRN  . lisinopril (PRINIVIL,ZESTRIL) 5 MG tablet Take 5 mg by mouth daily.  5 11/25/2018 at 0800  . metoCLOPramide (REGLAN) 10 MG tablet Take 1 tablet (10 mg total) by mouth every 6 (six) hours. (Patient taking differently: Take 10 mg by mouth 2 (two) times daily. ) 30 tablet 0 11/25/2018 at 0800  . ondansetron (ZOFRAN) 8 MG tablet Take 8 mg by mouth every 6 (six) hours as needed for nausea/vomiting.  Unknown at PRN  . pantoprazole (PROTONIX) 40 MG tablet Take 40 mg by mouth daily.  5 11/25/2018 at 0800  . ranitidine (ZANTAC) 150 MG tablet Take 150 mg by mouth daily.    11/25/2018 at 0800  . sertraline (ZOLOFT) 100 MG tablet Take 100 mg by mouth daily.   5 11/25/2018 at 0800  . sildenafil (REVATIO) 20 MG tablet 1-5 pills as needed for ED symptoms (Patient taking differently: Take 20-100 mg by mouth daily as needed (ED). ) 30 tablet 0 Unknown at PRN  . tiZANidine (ZANAFLEX) 4 MG tablet Take 1 tablet (4 mg total) by mouth every 6 (six) hours as needed for muscle spasms. 60 tablet 0 Unknown at PRN  . oxyCODONE (ROXICODONE) 5 MG immediate release tablet Take 1 tablet (5 mg total) by mouth every 6 (six) hours as needed for severe pain. (Patient not taking: Reported on 11/25/2018) 30 tablet 0 Not Taking at Unknown time  . sulfamethoxazole-trimethoprim (BACTRIM DS) 800-160 MG tablet Take 1 tablet 2 (two) times daily by  mouth. (Patient not taking: Reported on 11/25/2018) 14 tablet 0 Not Taking at Unknown time    Current medications: Current Facility-Administered Medications  Medication Dose Route Frequency Provider Last Rate Last Dose  . 0.9 %  sodium chloride infusion   Intravenous Continuous Gwyneth RevelsFowler, Justin, DPM 75 mL/hr at 11/28/18 [id number]    . acetaminophen (TYLENOL) tablet 650 mg  650 mg Oral Q6H PRN Gwyneth RevelsFowler, Justin, DPM   650 mg at 11/26/18 [id number]   Or  . acetaminophen (TYLENOL) suppository 650 mg  650 mg Rectal Q6H PRN Gwyneth RevelsFowler, Justin, DPM      . atorvastatin (LIPITOR) tablet 20 mg  20 mg Oral Daily Gwyneth RevelsFowler, Justin, DPM   20 mg at 11/28/18 0920  . busPIRone (BUSPAR) tablet 10 mg  10 mg Oral BID Gwyneth RevelsFowler, Justin, DPM   10 mg at 11/28/18 [id number]  . ceFEPIme (MAXIPIME) 2 g in sodium chloride 0.9 % 100 mL IVPB  2 g Intravenous Q12H Gwyneth RevelsFowler, Justin, DPM   Stopped at 11/27/18 2132  . Chlorhexidine Gluconate Cloth 2 % PADS 6 each  6 each Topical Q0600 Gwyneth RevelsFowler, Justin, DPM   Stopped at 11/28/18 0557  . dimenhyDRINATE (DRAMAMINE) tablet 50 mg  50 mg Oral PRN Gwyneth RevelsFowler, Justin, DPM      . enoxaparin (LOVENOX) injection 40 mg  40 mg Subcutaneous Q24H Gwyneth RevelsFowler, Justin, DPM   40 mg at 11/27/18 1710  . famotidine (PEPCID) tablet 20 mg  20 mg Oral BID Gwyneth RevelsFowler, Justin, DPM   20 mg at 11/28/18 [id number]  . gabapentin (NEURONTIN) capsule 600 mg  600 mg Oral QHS Gwyneth RevelsFowler, Justin, DPM   600 mg at 11/27/18 2101  . HYDROcodone-acetaminophen (NORCO/VICODIN) 5-325 MG per tablet 1 tablet  1 tablet Oral Q4H PRN Pyreddy, Vivien RotaPavan, MD      . HYDROcodone-acetaminophen (NORCO/VICODIN) 5-325 MG per tablet 1-2 tablet  1-2 tablet Oral Q6H PRN Gwyneth RevelsFowler, Justin, DPM   2 tablet at 11/28/18 0919  . insulin pump   Subcutaneous Q4H Gwyneth RevelsFowler, Justin, DPM      . lisinopril (PRINIVIL,ZESTRIL) tablet 5 mg  5 mg Oral Daily Gwyneth RevelsFowler, Justin, DPM   5 mg at 11/28/18 0920  . metoCLOPramide (REGLAN) tablet 10 mg  10 mg Oral BID Gwyneth RevelsFowler, Justin, DPM   10 mg at 11/28/18 [id number]  . morphine 2  MG/ML injection 2 mg  2 mg Intravenous Q4H PRN Gwyneth RevelsFowler, Justin, DPM   2 mg at 11/27/18 2207  . mupirocin ointment (BACTROBAN) 2 %  1 application  1 application Nasal BID Gwyneth Revels, DPM   1 application at 11/28/18 1610  . nicotine (NICODERM CQ - dosed in mg/24 hours) patch 21 mg  21 mg Transdermal Daily Gwyneth Revels, DPM   21 mg at 11/28/18 0925  . ondansetron (ZOFRAN) tablet 4 mg  4 mg Oral Q6H PRN Gwyneth Revels, DPM       Or  . ondansetron [facility]) injection 4 mg  4 mg Intravenous Q6H PRN Gwyneth Revels, DPM   4 mg at 11/26/18 1617  . oxyCODONE (Oxy IR/ROXICODONE) immediate release tablet 5 mg  5 mg Oral Q6H PRN Gwyneth Revels, DPM      . pantoprazole (PROTONIX) EC tablet 40 mg  40 mg Oral Daily Gwyneth Revels, DPM   40 mg at 11/28/18 0920  . potassium chloride (KLOR-CON) packet 20 mEq  20 mEq Oral Daily Gouru, Aruna, MD      . senna (SENOKOT) tablet 8.6 mg  1 tablet Oral BID Gwyneth Revels, DPM   8.6 mg at 11/28/18 0919  . sertraline (ZOLOFT) tablet 100 mg  100 mg Oral Daily Gwyneth Revels, DPM   100 mg at 11/28/18 9604  . tiZANidine (ZANAFLEX) tablet 4 mg  4 mg Oral Q6H PRN Gwyneth Revels, DPM      . vancomycin (VANCOCIN) 1,500 mg in sodium chloride 0.9 % 500 mL IVPB  1,500 mg Intravenous Q12H Gwyneth Revels, DPM 250 mL/hr at 11/28/18 0558 1,500 mg at 11/28/18 5409      Allergies: No Known Allergies    Past Medical History: Past Medical History:  Diagnosis Date  . DIABETES MELLITUS, TYPE II, UNCONTROLLED 03/17/2009  . DM 12/08/2008  . HYPERLIPIDEMIA 03/17/2009  . HYPERTENSION 12/08/2008  . 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 ANTERIOR CERVICAL DECOMP/DISCECTOMY FUSION N/A 09/09/2017   Procedure: ANTERIOR CERVICAL DECOMPRESSION/DISCECTOMY FUSION CERVICAL 6- CERVICAL 7;  Surgeon: Coletta Memos, MD;  Location: [facility] OR;  Service: Neurosurgery;  Laterality: N/A;  ANTERIOR CERVICAL DECOMPRESSION/DISCECTOMY FUSION  CERVICAL 6- CERVICAL 7  . APPENDECTOMY    . I&D EXTREMITY Right 10/03/2017   Procedure: IRRIGATION AND DEBRIDEMENT RIGHT WRIST;  Surgeon: Betha Loa, MD;  Location: [facility] OR;  Service: Orthopedics;  Laterality: Right;  . I&D EXTREMITY Right 11/26/2018   Procedure: IRRIGATION AND DEBRIDEMENT FASCIA ON RIGHT FOOT;  Surgeon: Gwyneth Revels, DPM;  Location: [facility] ORS;  Service: Podiatry;  Laterality: Right;  . osteomylitis    . ROTATOR CUFF REPAIR Left      Family History: Family History  Problem Relation Age of Onset  . Diabetes Mother   . Heart disease Father   . Diabetes Father   . Arthritis Other   . Hyperlipidemia Other   . Hypertension Other   . Cancer Other        breast     Social History: Social History   Socioeconomic History  . Marital status: Married    Spouse name: Not on file  . Number of children: Not on file  . Years of education: Not on file  . Highest education level: Not on file  Occupational History  . Not on file  Social Needs  . Financial resource strain: Not on file  . Food insecurity:    Worry: Not on file    Inability: Not on file  . Transportation needs:    Medical: Not on file    Non-medical: Not on file  Tobacco Use  .  Smoking status: Current Every Day Smoker    Packs/day: 1.00    Years: 17.00    Pack years: 17.00    Types: Cigarettes  . Smokeless tobacco: Never Used  . Tobacco comment: currently smoking 1 ppd.    Substance and Sexual Activity  . Alcohol use: Yes    Frequency: Never    Comment: rare  . Drug use: No  . Sexual activity: Yes  Lifestyle  . Physical activity:    Days per week: Not on file    Minutes per session: Not on file  . Stress: Not on file  Relationships  . Social connections:    Talks on phone: Not on file    Gets together: Not on file    Attends religious service: Not on file    Active member of club or organization: Not on file    Attends meetings of clubs or organizations: Not on file    Relationship  status: Not on file  . Intimate partner violence:    Fear of current or ex partner: Not on file    Emotionally abused: Not on file    Physically abused: Not on file    Forced sexual activity: Not on file  Other Topics Concern  . Not on file  Social History Narrative  . Not on file     Review of Systems: Gen: No fevers or chills HEENT: No vision or hearing problems reported CV: No chest pain or shortness of breath Resp: No cough or sputum production GI: Appetite is good, denies nausea, vomiting, diarrhea GU : No problems with voiding, no hematuria, no stone MS: Right foot wound infection Derm:  No complaints Psych: No complaints Heme: No complaints Neuro: Peripheral neuropathy in feet and hand Endocrine.  Poorly controlled diabetes  Vital Signs: Blood pressure (!) 173/99, pulse 80, temperature 97.8 F (36.6 C), temperature source Oral, resp. rate 20, height 5\' 10"  (1.778 m), weight 95.3 kg, SpO2 98 %.   Intake/Output Summary (Last 24 hours) at 11/28/2018 1132 Last data filed at 11/28/2018 0300 Gross per 24 hour  Intake 4033.89 ml  Output -  Net 4033.89 ml    Weight trends: Filed Weights   11/25/18 1615 11/26/18 1517  Weight: 95.3 kg 95.3 kg    Physical Exam: General:  No acute distress, sitting up in the bed  HEENT  anicteric, moist oral mucous membranes  Neck:  Supple  Lungs:  Normal breathing effort, clear to auscultation  Heart::  Regular, no rub  Abdomen:  Soft, nontender  Extremities:  Right foot bandaged  Neurologic:  Alert, able to answer questions appropriately  Skin:  No acute rashes    Lab results: Basic Metabolic Panel: Recent Labs  Lab 11/25/18 1621 11/26/18 0331 11/26/18 1339 11/28/18 0627  NA 134* 135  --   --   K 3.1* 2.8* 3.6 3.4*  CL 103 105  --   --   CO2 22 22  --   --   GLUCOSE 274* 185*  --   --   BUN 14 14  --   --   CREATININE 1.15 1.10  --   --   CALCIUM 9.0 8.5*  --   --   MG  --  1.7  --   --     Liver Function  Tests: Recent Labs  Lab 11/25/18 1621  AST 16  ALT 13  ALKPHOS 117  BILITOT 0.7  PROT 7.4  ALBUMIN 3.4*   No results for input(s):  LIPASE, AMYLASE in the last 168 hours. No results for input(s): AMMONIA in the last 168 hours.  CBC: Recent Labs  Lab 11/26/18 0331 11/28/18 0627  WBC 11.8* 10.2  HGB 10.5* 9.7*  HCT 32.3* 30.5*  MCV 85.4 87.1  PLT 223 212    Cardiac Enzymes: No results for input(s): CKTOTAL, TROPONINI in the last 168 hours.  BNP: Invalid input(s): POCBNP  CBG: Recent Labs  Lab 11/27/18 1639 11/27/18 2038 11/28/18 0121 11/28/18 0425 11/28/18 0802  GLUCAP 202* 210* 162* 148* 151*    Microbiology: Recent Results (from the past 720 hour(s))  MRSA PCR Screening     Status: Abnormal   Collection Time: 11/26/18  1:20 AM  Result Value Ref Range Status   MRSA by PCR POSITIVE (A) NEGATIVE Final    Comment:        The GeneXpert MRSA Assay (FDA approved for NASAL specimens only), is one component of a comprehensive MRSA colonization surveillance program. It is not intended to diagnose MRSA infection nor to guide or monitor treatment for MRSA infections. RESULT CALLED TO, READ BACK BY AND VERIFIED WITH: STACY CLAY @0350  11/26/18 AKT Performed at [facility], [address] [place], [place] [address]   Aerobic/Anaerobic Culture (surgical/deep wound)     Status: None (Preliminary result)   Collection Time: 11/26/18  3:53 PM  Result Value Ref Range Status   Specimen Description   Final    WOUND Performed at [facility], [address] [place], [place] [address]    Special Requests   Final    NONE Performed at [facility], [address] [place], [place] [address]    Gram Stain   Final    FEW WBC PRESENT, PREDOMINANTLY PMN RARE GRAM POSITIVE COCCI Performed at [facility], [address] [place], [place] [address]    Culture FEW STAPHYLOCOCCUS AUREUS  Final   Report Status PENDING  Incomplete      Coagulation Studies: Recent Labs    11/26/18 0331  LABPROT 13.8  INR 1.07    Urinalysis: No results for input(s): COLORURINE, LABSPEC, PHURINE, GLUCOSEU, HGBUR, BILIRUBINUR, KETONESUR, PROTEINUR, UROBILINOGEN, NITRITE, LEUKOCYTESUR in the last 72 hours.  Invalid input(s): APPERANCEUR      Imaging:  No results found.   Assessment & Plan: Pt is a 43 y.o. Caucasian  male with poorly controlled diabetes for the past 15 years, complicated by neuropathy, right foot ulcer, hypertension, who was admitted to [facility] on 11/25/2018 for evaluation of right foot infection.   Hypokalemia Denies GI losses/diarrhea, nausea or vomiting.  No use of recent diuretics.  Patient does have to use large amounts of insulin for control of diabetes which may be contributing.  Patient does report eating adequate potassium diet/normal diet.  Diabetes with complications - peripheral neuropathy, diabetic foot ulcer  Plan: Start low-dose amiloride Avoid potassium supplementation for now until effect of amiloride can be determined Obtain urinalysis We will follow closely      LOS: 3 Nainika Newlun Thedore MinsSingh 1/10/202011:32 AM  [facility] [place], [place] [phone]  Note: This note was prepared with Dragon dictation. Any transcription errors are unintentional

## 2018-11-28 NOTE — Progress Notes (Addendum)
[facility] [place] [facility]   PATIENT NAME: Richard LeepMichael Mendoza    MR#:  [id number]  DATE OF BIRTH:  June 27, 1976  SUBJECTIVE:  Patient's pain is tolerable patient had incision and drainage of the foot     REVIEW OF SYSTEMS:    Review of Systems  Constitutional: Negative for fever, chills weight loss HENT: Negative for ear pain, nosebleeds, congestion, facial swelling, rhinorrhea, neck pain, neck stiffness and ear discharge.   Respiratory: Negative for cough, shortness of breath, wheezing  Cardiovascular: Negative for chest pain, palpitations and leg swelling.  Gastrointestinal: Negative for heartburn, abdominal pain, vomiting, diarrhea or consitpation Genitourinary: Negative for dysuria, urgency, frequency, hematuria Musculoskeletal: Negative for back pain or joint pain Neurological: Negative for dizziness, seizures, syncope, focal weakness,  numbness and headaches.  Hematological: Does not bruise/bleed easily.  Psychiatric/Behavioral: Negative for hallucinations, confusion, dysphoric mood  Foot infection/abscess  Tolerating Diet: N.p.o.      DRUG ALLERGIES:  No Known Allergies  VITALS:  Blood pressure 139/87, pulse 67, temperature 98.4 F (36.9 C), temperature source Oral, resp. rate 18, height 5\' 10"  (1.778 m), weight 95.3 kg, SpO2 100 %.  PHYSICAL EXAMINATION:  Constitutional: Appears well-developed and well-nourished. No distress. HENT: Normocephalic. Marland Kitchen. Oropharynx is clear and moist.  Eyes: Conjunctivae and EOM are normal. PERRLA, no scleral icterus.  Neck: Normal ROM. Neck supple. No JVD. No tracheal deviation. CVS: RRR, S1/S2 +, no murmurs, no gallops, no carotid bruit.  Pulmonary: Effort and breath sounds normal, no stridor, rhonchi, wheezes, rales.  Abdominal: Soft. BS +,  no distension, tenderness, rebound or guarding.  Musculoskeletal: Normal range of motion. No edema and no tenderness.  Neuro: Alert. CN 2-12 grossly intact. No focal  deficits. Skin: Diffuse edema right foot with clean dressing  psychiatric: Normal mood and affect.      LABORATORY PANEL:   CBC Recent Labs  Lab 11/28/18 0627  WBC 10.2  HGB 9.7*  HCT 30.5*  PLT 212   ------------------------------------------------------------------------------------------------------------------  Chemistries  Recent Labs  Lab 11/25/18 1621 11/26/18 0331  11/28/18 0627 11/28/18 1059  NA 134* 135  --   --   --   K 3.1* 2.8*   < > 3.4*  --   CL 103 105  --   --   --   CO2 22 22  --   --   --   GLUCOSE 274* 185*  --   --   --   BUN 14 14  --   --   --   CREATININE 1.15 1.10  --   --  0.98  CALCIUM 9.0 8.5*  --   --   --   MG  --  1.7  --   --   --   AST 16  --   --   --   --   ALT 13  --   --   --   --   ALKPHOS 117  --   --   --   --   BILITOT 0.7  --   --   --   --    < > = values in this interval not displayed.   ------------------------------------------------------------------------------------------------------------------  Cardiac Enzymes No results for input(s): TROPONINI in the last 168 hours. ------------------------------------------------------------------------------------------------------------------  RADIOLOGY:  [place] Ekg Site Rite  Result Date: 11/28/2018 If Site Rite image not attached, placement could not be confirmed due to current cardiac rhythm.    ASSESSMENT AND PLAN:    43 year old male  with history of diabetes and essential hypertension who is referred by podiatry for right foot abscess.  1.  Right foot abscess: Status post I&D  postop day #2 ID consultation placed and seen by Dr. Rudene Anda culture is growing staph aureus recommending to discontinue cefepime and vancomycin IV for 2 weeks PICC line placement Follow-up with podiatry, recommending nonweightbearing with physical therapy-no PT needs identified Culture sensitivities are pending Podiatry Dr. Ether Griffins will see the patient tomorrow  Needs home health for  dressing changes,Flush wound with saline  and cover with bulky gauze dressing.    2. Uncontrolled diabetes with A1c of 11.5 in October: Continue current management recommendations by diabetes nurse.  3.  Essential hypertension: Continue lisinopril  4.Tobacco dependence: Patient is encouraged to quit smoking. Counseling was provided for 4 minutes. He has a nicotine patch placed  5.  Depression: Continue Zoloft and BuSpar  6.  Hyperlipidemia: Continue statin  7.  Recurrent/ persistent hypokalemia: Despite outpatient over-the-counter potassium supplements replete Consult placed to nephrology per family request, message sent to Dr. Thedore Mins via secure epic chart, he is aware Dietitian consult for potassium rich diet- 20 mg of p.o. potassium added to the regimen, suggested patient to discontinue over-the-counter potassium supplements  Management plans discussed with the patient and he is in agreement.  CODE STATUS: FULL  TOTAL TIME TAKING CARE OF THIS PATIENT: 35 minutes.     POSSIBLE D/C 1 days, DEPENDING ON CLINICAL CONDITION.   Ramonita Lab M.D on 11/28/2018 at 4:02 PM  Between 7am to 6pm - Pager - [phone] After 6pm go to [email or web address] - password [email or web address]  [facility]  Office  [phone]  CC: Primary care physician; Raynelle Bring  Note: This dictation was prepared with Dragon dictation along with smaller phrase technology. Any transcriptional errors that result from this process are unintentional.

## 2018-11-28 NOTE — Care Management Note (Signed)
Case Management Note  Patient Details  Name: Richard Mendoza MRN: [id number] Date of Birth: 12-20-1975   Patient admitted with right foot abscess.  Patient lives at home with significant other.  PCP Hande.  PT has assessed patient and does not recommend any PT follow up.  PT has recommended crutches and or knee scooter.  Per Barbara Cower with [facility] Medicaid does not cover knee scooters.  It he would lie to rent one from [facility] it would be $80 a month to rent, or $300 to purchase.  Patient also notified that one could be purchased on [employer].  Barbara Cower with [facility] and MD notified that patient would need crutches at discharge.  Cultures pending.   Subjective/Objective:                    Action/Plan:   Expected Discharge Date:                  Expected Discharge Plan:  Home/Self Care  In-House Referral:     Discharge planning Services     Post Acute Care Choice:  Durable Medical Equipment Choice offered to:     DME Arranged:  Crutches DME Agency:  [facility]  HH Arranged:    HH Agency:     Status of Service:  In process, will continue to follow  If discussed at Long Length of Stay Meetings, dates discussed:    Additional Comments:  Chapman Fitch, RN 11/28/2018, 11:22 AM

## 2018-11-28 NOTE — Progress Notes (Signed)
Daily Progress Note   Subjective  - 2 Days Post-Op  F/u I & D.  Objective Vitals:   11/27/18 2037 11/28/18 0425 11/28/18 0900 11/28/18 1225  BP: 138/85 137/83 (!) 173/99 139/87  Pulse: 70 70 80 67  Resp: 20 20  18   Temp: 98.4 F (36.9 C) 97.8 F (36.6 C)  98.4 F (36.9 C)  TempSrc: Oral Oral  Oral  SpO2: 100% 98%  100%  Weight:      Height:        Physical Exam: Wound stable. No purulence.  Erythema stable.    Laboratory CBC    Component Value Date/Time   WBC 10.2 11/28/2018 0627   HGB 9.7 (L) 11/28/2018 0627   HCT 30.5 (L) 11/28/2018 0627   PLT 212 11/28/2018 0627    BMET    Component Value Date/Time   NA 135 11/26/2018 0331   K 3.4 (L) 11/28/2018 0627   CL 105 11/26/2018 0331   CO2 22 11/26/2018 0331   GLUCOSE 185 (H) 11/26/2018 0331   BUN 14 11/26/2018 0331   CREATININE 0.98 11/28/2018 1059   CALCIUM 8.5 (L) 11/26/2018 0331   GFRNONAA >60 11/28/2018 1059   GFRAA >60 11/28/2018 1059   Culture with few Staph.  Awaiting sensitivities. Assessment/Planning: Abscess s/p I & D.   Dressing changed.  Seen with ID.  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  C/W nwb.  Will f/u tomorrow.  Suspect can d/c after sensitivities are resulted.  Will need home health for dressing changes.  Flush wound with salinig and cover with bulky gauze dressing.   Gwyneth Revels A  11/28/2018, 2:29 PM

## 2018-11-28 NOTE — Progress Notes (Signed)
Peripherally Inserted Central Catheter/Midline Placement  The IV Nurse has discussed with the patient and/or persons authorized to consent for the patient, the purpose of this procedure and the potential benefits and risks involved with this procedure.  The benefits include less needle sticks, lab draws from the catheter, and the patient may be discharged home with the catheter. Risks include, but not limited to, infection, bleeding, blood clot (thrombus formation), and puncture of an artery; nerve damage and irregular heartbeat and possibility to perform a PICC exchange if needed/ordered by physician.  Alternatives to this procedure were also discussed.  Bard Power PICC patient education guide, fact sheet on infection prevention and patient information card has been provided to patient /or left at bedside.    PICC/Midline Placement Documentation  PICC Single Lumen 11/28/18 PICC Right Brachial 39 cm 0 cm (Active)  Indication for Insertion or Continuance of Line Home intravenous therapies (PICC only) 11/28/2018  4:41 PM  Exposed Catheter (cm) 0 cm 11/28/2018  4:41 PM  Site Assessment Clean;Dry;Intact 11/28/2018  4:41 PM  Line Status Flushed;Saline locked;Blood return noted 11/28/2018  4:41 PM  Dressing Type Transparent 11/28/2018  4:41 PM  Dressing Status Clean;Dry;Intact;Antimicrobial disc in place 11/28/2018  4:41 PM  Dressing Change Due 12/05/18 11/28/2018  4:41 PM       Ethelda Chick 11/28/2018, 5:32 PM

## 2018-11-28 NOTE — Plan of Care (Signed)
Nutrition Education Note  RD consulted for nutrition education regarding a high potassium diet. Pt has persistent hypokalemia. RD also provided education regarding nutrition therapy for diabetes per pt request.  Provided lists of foods to include in pt's diet that are high potassium.  Discussed pt's dietary recall. Pt reports typically having a great appetite and eating several meals daily. Pt's wife shares that she has been giving pt an OTC potassium supplement. Pt states that he already eats a lot of foods that are high in potassium including potatoes, bananas, etc. RD encouraged pt to continue to eat these foods and to add higher potassium foods to pt's diet as snacks.  Lab Results  Component Value Date   HGBA1C 11.2 (H) 09/09/2017    Also discussed different food groups and their effects on blood sugar, emphasizing carbohydrate-containing foods. Pt states that he uses and insulin pump and counts carbohydrates. Pt shares that occasionally he "gets lazy" and doesn't put his carbohydrates into his pump.  Discussed importance of controlled and consistent carbohydrate intake throughout the day and compliance with pump. Provided examples of ways to balance meals/snacks and encouraged intake of high-fiber, whole grain complex carbohydrates. Teach back method used.  Expect good compliance.  Body mass index is 30.13 kg/m. Pt meets criteria for obesity based on current BMI.  Current diet order is Carb Modified, patient is consuming approximately 75% of meals at this time. Labs and medications reviewed. No further nutrition interventions warranted at this time. RD contact information provided. If additional nutrition issues arise, please re-consult RD.   Earma Reading, MS, RD, LDN Inpatient Clinical Dietitian Pager: [phone] Weekend/After Hours: [phone]

## 2018-11-28 NOTE — Care Management (Signed)
Update:  Crutches have been delivered to room by Barbara Cower from [facility].    Per ID note plan is to discharge on home IV antibiotics. Order for PICC line as been placed.  CMS Medicare.gov Compare Post Acute Care list reviewed with patient. Patient states he does not have a preference of agency.  Heads up referral made to [facility] with [facility].

## 2018-11-28 NOTE — Progress Notes (Signed)
Podiatry messaged: Can [name] be able to take a shower if his Right lower legs is cover. The patient and wife want to know.

## 2018-11-29 LAB — RENAL FUNCTION PANEL
Albumin: 2.6 g/dL — ABNORMAL LOW (ref 3.5–5.0)
Anion gap: 5 (ref 5–15)
BUN: 15 mg/dL (ref 6–20)
CO2: 23 mmol/L (ref 22–32)
CREATININE: 0.92 mg/dL (ref 0.61–1.24)
Calcium: 8.5 mg/dL — ABNORMAL LOW (ref 8.9–10.3)
Chloride: 112 mmol/L — ABNORMAL HIGH (ref 98–111)
GFR calc Af Amer: >60 mL/min (ref >60)
Glucose, Bld: 114 mg/dL — ABNORMAL HIGH (ref 70–99)
Phosphorus: 3.5 mg/dL (ref 2.5–4.6)
Potassium: 3.6 mmol/L (ref 3.5–5.1)
Sodium: 140 mmol/L (ref 135–145)

## 2018-11-29 LAB — GLUCOSE, CAPILLARY
GLUCOSE-CAPILLARY: 85 mg/dL (ref 70–99)
Glucose-Capillary: 122 mg/dL — ABNORMAL HIGH (ref 70–99)
Glucose-Capillary: 151 mg/dL — ABNORMAL HIGH (ref 70–99)

## 2018-11-29 LAB — SEDIMENTATION RATE: Sed Rate: 79 mm/hr — ABNORMAL HIGH (ref 0–15)

## 2018-11-29 LAB — MAGNESIUM: Magnesium: 2 mg/dL (ref 1.7–2.4)

## 2018-11-29 LAB — VANCOMYCIN, TROUGH: Vancomycin Tr: 12 ug/mL — ABNORMAL LOW (ref 15–20)

## 2018-11-29 LAB — C-REACTIVE PROTEIN: CRP: 4.8 mg/dL — ABNORMAL HIGH (ref <1.0)

## 2018-11-29 MED ORDER — VANCOMYCIN HCL 10 G IV SOLR: 1500.0000 mg | Freq: Two times a day (BID) | INTRAVENOUS | 0 refills | Status: AC

## 2018-11-29 MED ORDER — AMILORIDE HCL 5 MG PO TABS: 5.0000 mg | ORAL_TABLET | Freq: Every day | ORAL | 0 refills | Status: DC

## 2018-11-29 MED ORDER — MUPIROCIN 2 % EX OINT: 1.0000 "application " | TOPICAL_OINTMENT | Freq: Two times a day (BID) | CUTANEOUS | 0 refills | Status: DC

## 2018-11-29 MED ORDER — ACETAMINOPHEN 325 MG PO TABS: 650.0000 mg | ORAL_TABLET | Freq: Four times a day (QID) | ORAL | Status: DC | PRN

## 2018-11-29 MED ORDER — IBUPROFEN 200 MG PO TABS: 400.0000 mg | ORAL_TABLET | Freq: Four times a day (QID) | ORAL | 0 refills | Status: DC | PRN

## 2018-11-29 MED ORDER — HYDROCODONE-ACETAMINOPHEN 5-325 MG PO TABS: 1.0000 | ORAL_TABLET | Freq: Four times a day (QID) | ORAL | 0 refills | Status: DC | PRN

## 2018-11-29 MED ORDER — NICOTINE 21 MG/24HR TD PT24: 21.0000 mg | MEDICATED_PATCH | Freq: Every day | TRANSDERMAL | 0 refills | Status: DC

## 2018-11-29 NOTE — Care Management Note (Signed)
Case Management Note  Patient Details  Name: Richard LeepMichael Mendoza MRN: [id number] Date of Birth: Oct 07, 1976  Subjective/Objective:  Patient to be discharged per MD order. Orders in place for home health services. CMS Medicare.gov Compare Post Acute Care list reviewed with patient and patient has previously selected [facility]. Patient to discharge on IV abx. Patients spouse will assist. Start of care planned for tomorrow at 9 am. Patient will receive PM dosage here in the hospital as abx is every 12 hours. Orders in place for abx admin as well as dressing changes. Jermaine with [facility] aware of referral and has all needed information for IV abx. Spouse to transport.                   Action/Plan:   Expected Discharge Date:  11/29/18               Expected Discharge Plan:  Home w Home Health Services  In-House Referral:     Discharge planning Services  CM Consult  Post Acute Care Choice:  Durable Medical Equipment, Home Health Choice offered to:  Patient  DME Arranged:  Crutches DME Agency:  [facility]  [facility]  Status of Service:  Completed, signed off  If discussed at Long Length of Stay Meetings, dates discussed:    Additional Comments:  Virgel ManifoldJosh A Tyr Franca, RN 11/29/2018, 2:21 PM

## 2018-11-29 NOTE — Progress Notes (Signed)
Patient completed IV Abx. Patient discharged to home. Patient left via w/c accompanied by wife. Patient given discharge instructions and information , patient and spouse voiced understanding. Patient left with all belongings. Patient left with PICC. Patient will be receiving home IV Abx with [facility] nurse- set up for tomorrow morning at patient's residence.

## 2018-11-29 NOTE — Discharge Instructions (Signed)
Follow-up with primary care physician in 3 to 4 days.  Please check BMP patient has history of recurrent hypokalemia Outpatient follow-up with nephrology in 1 week Outpatient follow-up with podiatry Dr. Ether Griffins  in 2 to 3 weeks Outpatient follow-up with infectious disease Dr. Rudene Anda in 2 weeks

## 2018-11-29 NOTE — Discharge Summary (Signed)
[facility] [place] at [facility]   PATIENT NAME: Richard LeepMichael Mendoza    MR#:  [id number]  DATE OF BIRTH:  01/10/1976  DATE OF ADMISSION:  11/25/2018 ADMITTING PHYSICIAN: Ihor AustinPavan Pyreddy, MD  DATE OF DISCHARGE:  11/29/18 PRIMARY CARE PHYSICIAN: [facility]    ADMISSION DIAGNOSIS:  Osteomyelitis  DISCHARGE DIAGNOSIS:  Active Problems:   Abscess   SECONDARY DIAGNOSIS:   Past Medical History:  Diagnosis Date  . DIABETES MELLITUS, TYPE II, UNCONTROLLED 03/17/2009  . DM 12/08/2008  . HYPERLIPIDEMIA 03/17/2009  . HYPERTENSION 12/08/2008  . 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  1.  Right foot abscess: Status post I&D  postop day #3 ID consultation placed and seen by Dr. Rudene Andaavi Shanker culture is growing staph aureus recommending to discontinue cefepime and vancomycin IV for 2 weeks PICC line placement done on right upper extremity functioning well Follow-up with podiatry, recommending nonweightbearing with physical therapy-no PT needs identified Culture sensitivities staph Podiatry Dr. Ether GriffinsFowler has seen the patient  We will arrange home health for dressing changes,NWB to foot. Dressing changes:  Flush wound with saline and apply bulky sterile dressing at least 3 times per week Outpatient follow-up with Dr. Ether GriffinsFowler in 2 weeks and infectious disease in 2 weeks   2. Uncontrolled diabetes with A1c of 11.5 in October: Continue current management with insulin pump  3.  Essential hypertension: Continue lisinopril  4.Tobacco dependence: Patient is encouraged to quit smoking. Counseling was provided for 4 minutes. He has a nicotine patch placed  5.  Depression: Continue Zoloft and BuSpar  6.  Hyperlipidemia: Continue statin  7.  Recurrent/ persistent hypokalemia: Despite outpatient over-the-counter potassium supplements  replete.  Hypokalemia resolved at this time Consult placed to nephrology per family request, seen by Dr. Thedore MinsSingh and patient is started on amiloride Dietitian consult for potassium rich diet- Dc  over-the-counter potassium supplements  Management plans discussed with the patient and he is in agreement.    DISCHARGE CONDITIONS:   Stable   CONSULTS OBTAINED:  Treatment Team:  Gwyneth RevelsFowler, Justin, DPM Lynn Itoavishankar, Jayashree, MD Mosetta PigeonSingh, Harmeet, MD   PROCEDURES  -right foot incision and drainage  DRUG ALLERGIES:  No Known Allergies  DISCHARGE MEDICATIONS:   Allergies as of 11/29/2018   No Known Allergies     Medication List    STOP taking these medications   oxyCODONE 5 MG immediate release tablet Commonly known as:  ROXICODONE   sulfamethoxazole-trimethoprim 800-160 MG tablet Commonly known as:  BACTRIM DS     TAKE these medications   acetaminophen 325 MG tablet Commonly known as:  TYLENOL Take 2 tablets (650 mg total) by mouth every 6 (six) hours as needed for mild pain (or Fever >/= 101). What changed:    medication strength  how much to take  when to take this  reasons to take this   aMILoride 5 MG tablet Commonly known as:  MIDAMOR Take 1 tablet (5 mg total) by mouth daily. Start taking on:  November 30, 2018   atorvastatin 20 MG tablet Commonly known as:  LIPITOR Take 20 mg by mouth daily.   busPIRone 10 MG tablet Commonly known as:  BUSPAR Take 10 mg by mouth 2 (two) times daily.   [employer] SENNA 8.6 MG tablet Generic drug:  senna Take 1 tablet by mouth 2 (two) times daily.   dimenhyDRINATE 50 MG tablet Commonly  known as:  DRAMAMINE Take 50 mg by mouth 2 (two) times daily as needed for itching or dizziness.   famotidine 20 MG tablet Commonly known as:  PEPCID Take 20 mg by mouth 2 (two) times daily.   gabapentin 300 MG capsule Commonly known as:  NEURONTIN Take 600 mg by mouth at bedtime.   HYDROcodone-acetaminophen 5-325 MG tablet Commonly  known as:  NORCO/VICODIN Take 1-2 tablets by mouth every 6 (six) hours as needed for moderate pain.   ibuprofen 200 MG tablet Commonly known as:  ADVIL,MOTRIN Take 2-3 tablets (400-600 mg total) by mouth every 6 (six) hours as needed for moderate pain. What changed:    when to take this  reasons to take this   insulin aspart 100 UNIT/ML injection Commonly known as:  novoLOG Inject into the skin 3 (three) times daily before meals. Via pump   insulin pump Soln Inject 2.5 each into the skin every hour.   linaclotide 145 MCG Caps capsule Commonly known as:  LINZESS Take 72 mcg by mouth daily as needed (GI symptoms).   lisinopril 5 MG tablet Commonly known as:  PRINIVIL,ZESTRIL Take 5 mg by mouth daily.   metoCLOPramide 10 MG tablet Commonly known as:  REGLAN Take 1 tablet (10 mg total) by mouth every 6 (six) hours. What changed:  when to take this   mupirocin ointment 2 % Commonly known as:  BACTROBAN Place 1 application into the nose 2 (two) times daily.   nicotine 21 mg/24hr patch Commonly known as:  NICODERM CQ - dosed in mg/24 hours Place 1 patch (21 mg total) onto the skin daily. Start taking on:  November 30, 2018   ondansetron 8 MG tablet Commonly known as:  ZOFRAN Take 8 mg by mouth every 6 (six) hours as needed for nausea/vomiting.   pantoprazole 40 MG tablet Commonly known as:  PROTONIX Take 40 mg by mouth daily.   ranitidine 150 MG tablet Commonly known as:  ZANTAC Take 150 mg by mouth daily.   sertraline 100 MG tablet Commonly known as:  ZOLOFT Take 100 mg by mouth daily.   sildenafil 20 MG tablet Commonly known as:  REVATIO 1-5 pills as needed for ED symptoms What changed:    how much to take  how to take this  when to take this  reasons to take this  additional instructions   tiZANidine 4 MG tablet Commonly known as:  ZANAFLEX Take 1 tablet (4 mg total) by mouth every 6 (six) hours as needed for muscle spasms.   vancomycin 1,500 mg  in sodium chloride 0.9 % 500 mL Inject 1,500 mg into the vein every 12 (twelve) hours for 14 days.        DISCHARGE INSTRUCTIONS:   Follow-up with primary care physician in 3 to 4 days.  Please check BMP patient has history of recurrent hypokalemia Outpatient follow-up with nephrology in 1 week Outpatient follow-up with podiatry Dr. Ether Griffins  in 2 to 3 weeks Outpatient follow-up with infectious disease Dr. Rudene Anda in 2 weeks  DIET:  Diabetic diet  DISCHARGE CONDITION:  Stable  ACTIVITY:  Activity as tolerated  OXYGEN:  Home Oxygen: No.   Oxygen Delivery: room air  DISCHARGE LOCATION:  home   If you experience worsening of your admission symptoms, develop shortness of breath, life threatening emergency, suicidal or homicidal thoughts you must seek medical attention immediately by calling 911 or calling your MD immediately  if symptoms less severe.  You Must read complete instructions/literature  along with all the possible adverse reactions/side effects for all the Medicines you take and that have been prescribed to you. Take any new Medicines after you have completely understood and accpet all the possible adverse reactions/side effects.   Please note  You were cared for by a hospitalist during your hospital stay. If you have any questions about your discharge medications or the care you received while you were in the hospital after you are discharged, you can call the unit and asked to speak with the hospitalist on call if the hospitalist that took care of you is not available. Once you are discharged, your primary care physician will handle any further medical issues. Please note that NO REFILLS for any discharge medications will be authorized once you are discharged, as it is imperative that you return to your primary care physician (or establish a relationship with a primary care physician if you do not have one) for your aftercare needs so that they can reassess your need  for medications and monitor your lab values.     Today  No chief complaint on file.  Patient is doing fine with no complaints.  Right foot was evaluated by Dr. Ether Griffins and okay to discharge patient.  Patient had PICC line  ROS:  CONSTITUTIONAL: Denies fevers, chills. Denies any fatigue, weakness.  EYES: Denies blurry vision, double vision, eye pain. EARS, NOSE, THROAT: Denies tinnitus, ear pain, hearing loss. RESPIRATORY: Denies cough, wheeze, shortness of breath.  CARDIOVASCULAR: Denies chest pain, palpitations, edema.  GASTROINTESTINAL: Denies nausea, vomiting, diarrhea, abdominal pain. Denies bright red blood per rectum. GENITOURINARY: Denies dysuria, hematuria. ENDOCRINE: Denies nocturia or thyroid problems. HEMATOLOGIC AND LYMPHATIC: Denies easy bruising or bleeding. SKIN: Denies rash or lesion. MUSCULOSKELETAL: Denies pain in neck, back, shoulder, knees, hips or arthritic symptoms.  NEUROLOGIC: Denies paralysis, paresthesias.  PSYCHIATRIC: Denies anxiety or depressive symptoms.   VITAL SIGNS:  Blood pressure (!) 168/102, pulse 67, temperature 98 F (36.7 C), temperature source Oral, resp. rate 16, height 5\' 10"  (1.778 m), weight 95.3 kg, SpO2 100 %.  I/O:    Intake/Output Summary (Last 24 hours) at 11/29/2018 1210 Last data filed at 11/29/2018 1200 Gross per 24 hour  Intake 3066.08 ml  Output 400 ml  Net 2666.08 ml    PHYSICAL EXAMINATION:  GENERAL:  43 y.o.-year-old patient lying in the bed with no acute distress.  EYES: Pupils equal, round, reactive to light and accommodation. No scleral icterus. Extraocular muscles intact.  HEENT: Head atraumatic, normocephalic. Oropharynx and nasopharynx clear.  NECK:  Supple, no jugular venous distention. No thyroid enlargement, no tenderness.  LUNGS: Normal breath sounds bilaterally, no wheezing, rales,rhonchi or crepitation. No use of accessory muscles of respiration.  CARDIOVASCULAR: S1, S2 normal. No murmurs, rubs, or  gallops.  ABDOMEN: Soft, non-tender, non-distended. Bowel sounds present. No organomegaly or mass.  EXTREMITIES: No pedal edema, cyanosis, or clubbing.  NEUROLOGIC: Awake alert and oriented x3 sensation intact. Gait not checked.  PSYCHIATRIC: The patient is alert and oriented x 3.  SKIN: Minimal edema of the right foot with a clean dressing  DATA REVIEW:   CBC Recent Labs  Lab 11/28/18 0627  WBC 10.2  HGB 9.7*  HCT 30.5*  PLT 212    Chemistries  Recent Labs  Lab 11/25/18 1621  11/29/18 0453 11/29/18 0454  NA 134*   < >  --  140  K 3.1*   < >  --  3.6  CL 103   < >  --  112*  CO2 22   < >  --  23  GLUCOSE 274*   < >  --  114*  BUN 14   < >  --  15  CREATININE 1.15   < >  --  0.92  CALCIUM 9.0   < >  --  8.5*  MG  --    < > 2.0  --   AST 16  --   --   --   ALT 13  --   --   --   ALKPHOS 117  --   --   --   BILITOT 0.7  --   --   --    < > = values in this interval not displayed.    Cardiac Enzymes No results for input(s): TROPONINI in the last 168 hours.  Microbiology Results  Results for orders placed or performed during the hospital encounter of 11/25/18  MRSA PCR Screening     Status: Abnormal   Collection Time: 11/26/18  1:20 AM  Result Value Ref Range Status   MRSA by PCR POSITIVE (A) NEGATIVE Final    Comment:        The GeneXpert MRSA Assay (FDA approved for NASAL specimens only), is one component of a comprehensive MRSA colonization surveillance program. It is not intended to diagnose MRSA infection nor to guide or monitor treatment for MRSA infections. RESULT CALLED TO, READ BACK BY AND VERIFIED WITH: STACY CLAY @0350  11/26/18 AKT Performed at [facility], [address] [place], [place] [address]   Aerobic/Anaerobic Culture (surgical/deep wound)     Status: None (Preliminary result)   Collection Time: 11/26/18  3:53 PM  Result Value Ref Range Status   Specimen Description   Final    WOUND Performed at [facility], [address] [place], [place] [address]    Special Requests   Final    NONE Performed at [facility], [address] [place], [place] [address]    Gram Stain   Final    FEW WBC PRESENT, PREDOMINANTLY PMN RARE GRAM POSITIVE COCCI Performed at [facility], [address] [place], [place] [address]    Culture   Final    FEW METHICILLIN RESISTANT STAPHYLOCOCCUS AUREUS NO ANAEROBES ISOLATED; CULTURE IN PROGRESS FOR 5 DAYS    Report Status PENDING  Incomplete   Organism ID, Bacteria METHICILLIN RESISTANT STAPHYLOCOCCUS AUREUS  Final      Susceptibility   Methicillin resistant staphylococcus aureus - MIC*    CIPROFLOXACIN 2 INTERMEDIATE Intermediate     ERYTHROMYCIN >=8 RESISTANT Resistant     GENTAMICIN <=0.5 SENSITIVE Sensitive     OXACILLIN >=4 RESISTANT Resistant     TETRACYCLINE <=1 SENSITIVE Sensitive     VANCOMYCIN 1 SENSITIVE Sensitive     TRIMETH/SULFA <=10 SENSITIVE Sensitive     CLINDAMYCIN >=8 RESISTANT Resistant     RIFAMPIN <=0.5 SENSITIVE Sensitive     Inducible Clindamycin NEGATIVE Sensitive     * FEW METHICILLIN RESISTANT STAPHYLOCOCCUS AUREUS    RADIOLOGY:  Mr Foot Right W Wo Contrast  Result Date: 11/25/2018 CLINICAL DATA:  Small ulcerations along the plantar aspect of the right foot. EXAM: MRI OF THE RIGHT FOREFOOT WITHOUT AND WITH CONTRAST TECHNIQUE: Multiplanar, multisequence MR imaging of the right foot was performed before and after the administration of intravenous contrast. CONTRAST:  9.5 mL Gadavist COMPARISON:  None. FINDINGS: Bones/Joint/Cartilage 1. Distal tibia and fibula: Intact without marrow signal abnormality. 2. Ankle  and subtalar joints: Intact with trace ankle joint fluid more likely physiologic. No capsular bulging is noted the lateral images through the ankle. 3. Talus: Normal in signal intensity and morphology. No suspicious osseous lesion or fracture. 4. Calcaneus: Enthesopathy along the plantar dorsal aspect. No marrow signal  abnormality. 5. Midfoot: No acute marrow signal abnormality, fracture or joint dislocation. 6. Forefoot: No marrow signal abnormality, fracture or findings suspicious for osteomyelitis. Ligaments Negative Muscles and Tendons No evidence of pyomyositis. No muscle atrophy. No tenosynovitis. The tendons crossing the ankle and foot are of normal signal intensity morphology without disruption or tear. Mild thickening and tendinosis of the distal Achilles near the insertion on the calcaneus. Soft tissues Subcutaneous soft tissue edema surrounding the forefoot more so dorsally. Soft tissue ulcerations are identified along the plantar aspect of the forefoot at the level of the fourth and fifth metatarsal heads with soft tissue induration and probable granulation tissue. No drainable fluid collection or abscess is identified. IMPRESSION: 1. Superficial plantar ulcerations along the lateral aspect of the forefoot at the level of the fourth and fifth metatarsal heads. No adjacent marrow signal abnormality to suggest acute osteomyelitis. 2. Forefoot cellulitis with diffuse soft tissue edema is noted without drainable fluid collection or abscess. 3. Calcaneal enthesopathy. Electronically Signed   By: Tollie Eth M.D.   On: 11/25/2018 22:17   [place] Ekg Site Rite  Result Date: 11/28/2018 If Site Rite image not attached, placement could not be confirmed due to current cardiac rhythm.   EKG:   Orders placed or performed during the hospital encounter of 10/20/17  . EKG 12-Lead  . EKG 12-Lead  . EKG      Management plans discussed with the patient, family and they are in agreement.  CODE STATUS:     Code Status Orders  (From admission, onward)         Start     Ordered   11/25/18 1607  Full code  Continuous     11/25/18 1606        Code Status History    Date Active Date Inactive Code Status Order ID Comments User Context   10/21/2017 2123 10/22/2017 2032 Full Code [id number]  Marcelo Baldy, MD ED    09/09/2017 2157 09/10/2017 1700 Full Code [id number]  Coletta Memos, MD Inpatient      TOTAL TIME TAKING CARE OF THIS PATIENT: 43 minutes.   Note: This dictation was prepared with Dragon dictation along with smaller phrase technology. Any transcriptional errors that result from this process are unintentional.   @MEC @  on 11/29/2018 at 12:10 PM  Between 7am to 6pm - Pager - [phone]  After 6pm go to [email or web address] - password [email or web address] [facility]  [place] [facility]  Office  [phone]  CC: Primary care physician; Raynelle Bring

## 2018-11-29 NOTE — Progress Notes (Signed)
PHARMACY CONSULT NOTE FOR:  OUTPATIENT  PARENTERAL ANTIBIOTIC THERAPY (OPAT)  Indication: Right foot abscess growing staph aureus Regimen: Vancomycin 1500 mg IV every 12 hours for 2 weeks. Goal trough = 15-17 mcg/ml. End date: 12/13/18  Check Vancomycin trough level, SCr and BMP every Monday and Thursday. Check CBC/ESR every Monday. PICC removed at end of treatment.  Fax results to Dr. Delaine Lame at [phone].  For questions and to schedule an appointment in 2 weeks, call [phone].  IV antibiotic discharge orders are pended. To discharging provider:  please sign these orders via discharge navigator,  Select New Orders & click on the button choice - Manage This Unsigned Work.     Thank you for allowing pharmacy to be a part of this patient's care.  Olivia Canter, [facility] 11/29/2018, 9:46 AM

## 2018-11-29 NOTE — Progress Notes (Signed)
[facility], [place] 11/29/18  Subjective:   Patient is doing well today.  No acute complaints.  We started amiloride yesterday for low potassium.  Tolerated well.  Potassium is in normal range today.   Objective:  Vital signs in last 24 hours:  Temp:  [97.6 F (36.4 C)-98 F (36.7 C)] 98 F (36.7 C) (01/11 1149) Pulse Rate:  [64-67] 67 (01/11 1149) Resp:  [16-20] 16 (01/11 1149) BP: (146-168)/(96-102) 168/102 (01/11 1149) SpO2:  [98 %-100 %] 100 % (01/11 1149)  Weight change:  Filed Weights   11/25/18 1615 11/26/18 1517  Weight: 95.3 kg 95.3 kg    Intake/Output:    Intake/Output Summary (Last 24 hours) at 11/29/2018 1344 Last data filed at 11/29/2018 1239 Gross per 24 hour  Intake 3076.08 ml  Output 400 ml  Net 2676.08 ml   Physical Exam: General:  No acute distress, sitting up in the bed  HEENT  anicteric, moist oral mucous membranes  Neck:  Supple  Lungs:  Normal breathing effort, clear to auscultation  Heart::  Regular, no rub  Abdomen:  Soft, nontender  Extremities:  Right foot bandaged  Neurologic:  Alert, able to answer questions appropriately  Skin:  No acute rashes    Basic Metabolic Panel:  Recent Labs  Lab 11/25/18 1621 11/26/18 0331 11/26/18 1339 11/28/18 0627 11/28/18 1059 11/29/18 0453 11/29/18 0454  NA 134* 135  --   --   --   --  140  K 3.1* 2.8* 3.6 3.4*  --   --  3.6  CL 103 105  --   --   --   --  112*  CO2 22 22  --   --   --   --  23  GLUCOSE 274* 185*  --   --   --   --  114*  BUN 14 14  --   --   --   --  15  CREATININE 1.15 1.10  --   --  0.98  --  0.92  CALCIUM 9.0 8.5*  --   --   --   --  8.5*  MG  --  1.7  --   --   --  2.0  --   PHOS  --   --   --   --   --   --  3.5     CBC: Recent Labs  Lab 11/25/18 1621 11/26/18 0331 11/28/18 0627  WBC 13.4* 11.8* 10.2  HGB 11.7* 10.5* 9.7*  HCT 35.3* 32.3* 30.5*  MCV 84.2 85.4 87.1  PLT 236 223 212     No results found for: HEPBSAG, HEPBSAB,  HEPBIGM    Microbiology:  Recent Results (from the past 240 hour(s))  MRSA PCR Screening     Status: Abnormal   Collection Time: 11/26/18  1:20 AM  Result Value Ref Range Status   MRSA by PCR POSITIVE (A) NEGATIVE Final    Comment:        The GeneXpert MRSA Assay (FDA approved for NASAL specimens only), is one component of a comprehensive MRSA colonization surveillance program. It is not intended to diagnose MRSA infection nor to guide or monitor treatment for MRSA infections. RESULT CALLED TO, READ BACK BY AND VERIFIED WITH: STACY CLAY @0350  11/26/18 AKT Performed at [facility], [address] [place], [place] [address]   Aerobic/Anaerobic Culture (surgical/deep wound)     Status: None (Preliminary result)   Collection Time: 11/26/18  3:53  PM  Result Value Ref Range Status   Specimen Description   Final    WOUND Performed at [facility], [address] [place], [place] [address]    Special Requests   Final    NONE Performed at [facility], [address] [place], [place] [address]    Gram Stain   Final    FEW WBC PRESENT, PREDOMINANTLY PMN RARE GRAM POSITIVE COCCI Performed at [facility], [address] [place], [place] [address]    Culture   Final    FEW METHICILLIN RESISTANT STAPHYLOCOCCUS AUREUS NO ANAEROBES ISOLATED; CULTURE IN PROGRESS FOR 5 DAYS    Report Status PENDING  Incomplete   Organism ID, Bacteria METHICILLIN RESISTANT STAPHYLOCOCCUS AUREUS  Final      Susceptibility   Methicillin resistant staphylococcus aureus - MIC*    CIPROFLOXACIN 2 INTERMEDIATE Intermediate     ERYTHROMYCIN >=8 RESISTANT Resistant     GENTAMICIN <=0.5 SENSITIVE Sensitive     OXACILLIN >=4 RESISTANT Resistant     TETRACYCLINE <=1 SENSITIVE Sensitive     VANCOMYCIN 1 SENSITIVE Sensitive     TRIMETH/SULFA <=10 SENSITIVE Sensitive     CLINDAMYCIN >=8 RESISTANT Resistant     RIFAMPIN <=0.5 SENSITIVE Sensitive     Inducible  Clindamycin NEGATIVE Sensitive     * FEW METHICILLIN RESISTANT STAPHYLOCOCCUS AUREUS    Coagulation Studies: No results for input(s): LABPROT, INR in the last 72 hours.  Urinalysis: Recent Labs    11/28/18 1729  COLORURINE STRAW*  LABSPEC 1.009  PHURINE 6.0  GLUCOSEU 150*  HGBUR SMALL*  BILIRUBINUR NEGATIVE  KETONESUR NEGATIVE  PROTEINUR NEGATIVE  NITRITE NEGATIVE  LEUKOCYTESUR NEGATIVE      Imaging: [place] Ekg Site Rite  Result Date: 11/28/2018 If Site Rite image not attached, placement could not be confirmed due to current cardiac rhythm.    Medications:   . sodium chloride 75 mL/hr at 11/29/18 1200  . vancomycin Stopped (11/29/18 [id number])   . aMILoride  5 mg Oral Daily  . atorvastatin  20 mg Oral Daily  . busPIRone  10 mg Oral BID  . Chlorhexidine Gluconate Cloth  6 each Topical Q0600  . enoxaparin (LOVENOX) injection  40 mg Subcutaneous Q24H  . famotidine  20 mg Oral BID  . gabapentin  600 mg Oral QHS  . insulin pump   Subcutaneous Q4H  . lisinopril  5 mg Oral Daily  . metoCLOPramide  10 mg Oral BID  . mupirocin ointment  1 application Nasal BID  . nicotine  21 mg Transdermal Daily  . pantoprazole  40 mg Oral Daily  . senna  1 tablet Oral BID  . sertraline  100 mg Oral Daily  . sodium chloride flush  10-40 mL Intracatheter Q12H   acetaminophen **OR** acetaminophen, butalbital-acetaminophen-caffeine, dimenhyDRINATE, HYDROcodone-acetaminophen, HYDROcodone-acetaminophen, morphine injection, ondansetron **OR** ondansetron (ZOFRAN) IV, oxyCODONE, sodium chloride flush, tiZANidine  Assessment/ Plan:  43 y.o. male with poorly controlled diabetes for the past 15 years, complicated by neuropathy, right foot ulcer, hypertension, who was admitted to [facility] on 11/25/2018 for evaluation of right foot infection.   Hypokalemia Denies GI losses/diarrhea, nausea or vomiting.  No use of recent diuretics.  Patient does have to use large amounts of insulin for control of diabetes  which may be contributing.  Patient is able to eat adequate potassium diet/normal diet.  Diabetes with complications - peripheral neuropathy, diabetic foot ulcer -Discharged on IV vancomycin for 2 weeks  Plan: Continue t low-dose amiloride Avoid  potassium supplementation for now until effect of amiloride can be determined We will arrange outpatient office follow-up.  Patient cell #[phone]   LOS: 4 Ryann Leavitt 1/11/20201:44 PM  Central [address] [place], [place] [phone]  Note: This note was prepared with Dragon dictation. Any transcription errors are unintentional

## 2018-11-29 NOTE — Progress Notes (Signed)
Daily Progress Note   Subjective  - 3 Days Post-Op  F/u I & D.  No complaints  Objective Vitals:   11/28/18 0900 11/28/18 1225 11/28/18 2036 11/29/18 0517  BP: (!) 173/99 139/87 (!) 146/98 (!) 158/96  Pulse: 80 67 64 65  Resp:  18 20 18   Temp:  98.4 F (36.9 C) 97.7 F (36.5 C) 97.6 F (36.4 C)  TempSrc:  Oral Oral Oral  SpO2:  100% 100% 98%  Weight:      Height:        Physical Exam: Drainage is mild/minimal. No purulence.  Erythema improving  Laboratory CBC    Component Value Date/Time   WBC 10.2 11/28/2018 0627   HGB 9.7 (L) 11/28/2018 0627   HCT 30.5 (L) 11/28/2018 0627   PLT 212 11/28/2018 0627    BMET    Component Value Date/Time   NA 140 11/29/2018 0454   K 3.6 11/29/2018 0454   CL 112 (H) 11/29/2018 0454   CO2 23 11/29/2018 0454   GLUCOSE 114 (H) 11/29/2018 0454   BUN 15 11/29/2018 0454   CREATININE 0.92 11/29/2018 0454   CALCIUM 8.5 (L) 11/29/2018 0454   GFRNONAA >60 11/29/2018 0454   GFRAA >60 11/29/2018 0454    Assessment/Planning: MRSA s/p I & D.   NWB to foot.  Dressing changes:  Flush wound with saline and apply bulky sterile dressing at least 3 times per week.  F/U 2 weeks with me.  IV abx per ID (vancomycin).    Gwyneth Revels A  11/29/2018, 11:28 AM

## 2018-11-29 NOTE — Progress Notes (Signed)
Pharmacy Antibiotic Note  Richard Mendoza is a 43 y.o. male admitted on 11/25/2018 with cellulitis and abscess.  Pharmacy has been consulted for cefepime and vancomycin dosing.  Plan: Will give a loading dose of 2000 mg x 1. Will start a maintenance dose of 1500 mg q12H for a predicted trough of 10 and AUC 446. Goal AUC is 400-550. Plan to obtain levels at the 4th dose.    1/11  Scr 0.92, AUC ~ 513. Continue Vancomycin 1500 mg IV every 12 hours for 2 weeks as outpatient.   Height: 5\' 10"  (177.8 cm) Weight: 210 lb (95.3 kg) IBW/kg (Calculated) : 73  Temp (24hrs), Avg:97.9 F (36.6 C), Min:97.6 F (36.4 C), Max:98.4 F (36.9 C)  Recent Labs  Lab 11/25/18 1621 11/26/18 0331  11/28/18 0627 11/28/18 1059 11/28/18 2159 11/29/18 0454  WBC 13.4* 11.8*  --  10.2  --   --   --   CREATININE 1.15 1.10  --   --  0.98  --  0.92  VANCOTROUGH  --   --   --   --   --   --  12*  VANCOPEAK  --   --    < > 22* 21* 24*  --    < > = values in this interval not displayed.    Estimated Creatinine Clearance: 121.2 mL/min (by C-G formula based on SCr of 0.92 mg/dL).    No Known Allergies  Antimicrobials this admission: Vancomycin 1/7 >>  Cefepime 1/7 >> 1/10  Microbiology results: 1/11 Wound Cx: MRSA  Thank you for allowing pharmacy to be a part of this patient's care.  Stormy CardKatsoudas,Kenyana Husak K, Advanced Eye Surgery CenterRPH Clinical Pharmacist 11/29/2018 10:03 AM

## 2018-12-01 ENCOUNTER — Encounter: Payer: Self-pay | Admitting: Infectious Diseases

## 2018-12-01 LAB — AEROBIC/ANAEROBIC CULTURE W GRAM STAIN (SURGICAL/DEEP WOUND)

## 2018-12-01 NOTE — Anesthesia Postprocedure Evaluation (Signed)
Anesthesia Post Note  Patient: Richard Mendoza  Procedure(s) Performed: IRRIGATION AND DEBRIDEMENT FASCIA ON RIGHT FOOT (Right Foot)  Patient location during evaluation: PACU Anesthesia Type: General Level of consciousness: awake and alert and oriented Pain management: pain level controlled Vital Signs Assessment: post-procedure vital signs reviewed and stable Respiratory status: spontaneous breathing Cardiovascular status: blood pressure returned to baseline Anesthetic complications: no     Last Vitals:  Vitals:   11/29/18 0517 11/29/18 1149  BP: (!) 158/96 (!) 168/102  Pulse: 65 67  Resp: 18 16  Temp: 36.4 C 36.7 C  SpO2: 98% 100%    Last Pain:  Vitals:   11/29/18 1955  TempSrc:   PainSc: 4                  Neizan Debruhl

## 2018-12-02 ENCOUNTER — Telehealth: Payer: Self-pay | Admitting: Infectious Diseases

## 2018-12-02 NOTE — Telephone Encounter (Signed)
Patient's wife stating that he may be suffering from night sweats from taking vancomycin.

## 2018-12-09 ENCOUNTER — Telehealth: Payer: Self-pay | Admitting: Infectious Diseases

## 2018-12-09 NOTE — Telephone Encounter (Signed)
Called patient's wife and left a message

## 2018-12-09 NOTE — Telephone Encounter (Signed)
Spoke to her the same day and reassured her

## 2018-12-09 NOTE — Telephone Encounter (Signed)
[name] (wife) called she has question about results. She also asked about the PICC line and said today's readings may or may not be accurate.

## 2018-12-18 ENCOUNTER — Encounter: Payer: Self-pay | Admitting: Infectious Diseases

## 2018-12-18 ENCOUNTER — Other Ambulatory Visit
Admission: RE | Admit: 2018-12-18 | Discharge: 2018-12-18 | Disposition: A | Discharge status: Home / Self Care | Payer: Medicaid Other | Source: Ambulatory Visit | Attending: Infectious Diseases | Admitting: Infectious Diseases

## 2018-12-18 ENCOUNTER — Ambulatory Visit: Payer: Medicaid Other | Attending: Infectious Diseases | Admitting: Infectious Diseases

## 2018-12-18 VITALS — BP 130/89 | HR 76 | Temp 97.5°F | Ht 70.0 in | Wt 209.0 lb

## 2018-12-18 DIAGNOSIS — F17210 Nicotine dependence, cigarettes, uncomplicated: Secondary | ICD-10-CM

## 2018-12-18 DIAGNOSIS — E1143 Type 2 diabetes mellitus with diabetic autonomic (poly)neuropathy: Secondary | ICD-10-CM

## 2018-12-18 DIAGNOSIS — L02611 Cutaneous abscess of right foot: Secondary | ICD-10-CM

## 2018-12-18 DIAGNOSIS — B9562 Methicillin resistant Staphylococcus aureus infection as the cause of diseases classified elsewhere: Secondary | ICD-10-CM | POA: Diagnosis not present

## 2018-12-18 DIAGNOSIS — B999 Unspecified infectious disease: Secondary | ICD-10-CM | POA: Insufficient documentation

## 2018-12-18 DIAGNOSIS — A4902 Methicillin resistant Staphylococcus aureus infection, unspecified site: Secondary | ICD-10-CM

## 2018-12-18 DIAGNOSIS — K3184 Gastroparesis: Secondary | ICD-10-CM

## 2018-12-18 DIAGNOSIS — R42 Dizziness and giddiness: Secondary | ICD-10-CM

## 2018-12-18 LAB — MRSA PCR SCREENING: MRSA by PCR: NEGATIVE

## 2018-12-18 NOTE — Progress Notes (Signed)
NAME: Richard Mendoza  DOB: 1976/09/07  MRN: [id number]  Date/Time: 12/18/2018 10:43 AM Subjective:  Is here for follow up after hospital discharge ?wife present in the room Richard Mendoza is a 43 y.o. male with a history of DM, was in [facility] between 11/25/18-11/29/18 for rt foot infection- Had an abscess on the plantar aspect which was I/D by Dr.Fowler and it was MRSA  . He was sent home on vancomycin for 2 weeks and he completed the therapy last week and his wife removed the picc line as it as already coming out. He saw Dr.Fowler last week and was told that the wound was doing well. Pt and his wife says their hole family ( 2 sons, patient and wife) have had MRSA infections in the past few years intermittently- it started with their 47 year old son.  He says since he stopped vancomycin he has been feeling dizzy at times No fever or chills,  He has gastroparesis from DM and takes PRN meds  Past Medical History:  Diagnosis Date  . DIABETES MELLITUS, TYPE II, UNCONTROLLED 03/17/2009  . DM 12/08/2008  . HYPERLIPIDEMIA 03/17/2009  . HYPERTENSION 12/08/2008  . 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 ANTERIOR CERVICAL DECOMP/DISCECTOMY FUSION N/A 09/09/2017   Procedure: ANTERIOR CERVICAL DECOMPRESSION/DISCECTOMY FUSION CERVICAL 6- CERVICAL 7;  Surgeon: Coletta Memos, MD;  Location: [facility] OR;  Service: Neurosurgery;  Laterality: N/A;  ANTERIOR CERVICAL DECOMPRESSION/DISCECTOMY FUSION CERVICAL 6- CERVICAL 7  . APPENDECTOMY    . I&D EXTREMITY Right 10/03/2017   Procedure: IRRIGATION AND DEBRIDEMENT RIGHT WRIST;  Surgeon: Betha Loa, MD;  Location: [facility] OR;  Service: Orthopedics;  Laterality: Right;  . I&D EXTREMITY Right 11/26/2018   Procedure: IRRIGATION AND DEBRIDEMENT FASCIA ON RIGHT FOOT;  Surgeon: Gwyneth Revels, DPM;  Location: [facility] ORS;  Service: Podiatry;  Laterality: Right;  . osteomylitis    . ROTATOR CUFF REPAIR Left     Social History   Socioeconomic  History  . Marital status: Married    Spouse name: Not on file  . Number of children: Not on file  . Years of education: Not on file  . Highest education level: Not on file  Occupational History  . Not on file  Social Needs  . Financial resource strain: Not on file  . Food insecurity:    Worry: Not on file    Inability: Not on file  . Transportation needs:    Medical: Not on file    Non-medical: Not on file  Tobacco Use  . Smoking status: Current Every Day Smoker    Packs/day: 1.00    Years: 17.00    Pack years: 17.00    Types: Cigarettes  . Smokeless tobacco: Never Used  . Tobacco comment: currently smoking 1 ppd.    Substance and Sexual Activity  . Alcohol use: Yes    Frequency: Never    Comment: rare  . Drug use: No  . Sexual activity: Yes  Lifestyle  . Physical activity:    Days per week: Not on file    Minutes per session: Not on file  . Stress: Not on file  Relationships  . Social connections:    Talks on phone: Not on file    Gets together: Not on file    Attends religious service: Not on file    Active member of club or organization: Not on file    Attends meetings of clubs or organizations: Not on file  Relationship status: Not on file  . Intimate partner violence:    Fear of current or ex partner: Not on file    Emotionally abused: Not on file    Physically abused: Not on file    Forced sexual activity: Not on file  Other Topics Concern  . Not on file  Social History Narrative  . Not on file    Family History  Problem Relation Age of Onset  . Diabetes Mother   . Heart disease Father   . Diabetes Father   . Arthritis Other   . Hyperlipidemia Other   . Hypertension Other   . Cancer Other        breast   No Known Allergies  ? Current Outpatient Medications  Medication Sig Dispense Refill  . acetaminophen (TYLENOL) 325 MG tablet Take 2 tablets (650 mg total) by mouth every 6 (six) hours as needed for mild pain (or Fever >/= 101).    Marland Kitchen.  aMILoride (MIDAMOR) 5 MG tablet Take 1 tablet (5 mg total) by mouth daily. 30 tablet 0  . atorvastatin (LIPITOR) 20 MG tablet Take 20 mg by mouth daily.  3  . busPIRone (BUSPAR) 10 MG tablet Take 10 mg by mouth 2 (two) times daily.    . [employer] SENNA 8.6 MG tablet Take 1 tablet by mouth 2 (two) times daily.   5  . dimenhyDRINATE (DRAMAMINE) 50 MG tablet Take 50 mg by mouth 2 (two) times daily as needed for itching or dizziness.     . famotidine (PEPCID) 20 MG tablet Take 20 mg by mouth 2 (two) times daily.    Marland Kitchen. gabapentin (NEURONTIN) 300 MG capsule Take 600 mg by mouth at bedtime.     Marland Kitchen. HYDROcodone-acetaminophen (NORCO/VICODIN) 5-325 MG tablet Take 1-2 tablets by mouth every 6 (six) hours as needed for moderate pain. 30 tablet 0  . ibuprofen (ADVIL,MOTRIN) 200 MG tablet Take 2-3 tablets (400-600 mg total) by mouth every 6 (six) hours as needed for moderate pain. 30 tablet 0  . insulin aspart (NOVOLOG) 100 UNIT/ML injection Inject into the skin 3 (three) times daily before meals. Via pump    . Insulin Human (INSULIN PUMP) SOLN Inject 2.5 each into the skin every hour.    . linaclotide (LINZESS) 145 MCG CAPS capsule Take 72 mcg by mouth daily as needed (GI symptoms).     Marland Kitchen. lisinopril (PRINIVIL,ZESTRIL) 5 MG tablet Take 5 mg by mouth daily.  5  . metoCLOPramide (REGLAN) 10 MG tablet Take 1 tablet (10 mg total) by mouth every 6 (six) hours. (Patient taking differently: Take 10 mg by mouth 2 (two) times daily. ) 30 tablet 0  . mupirocin ointment (BACTROBAN) 2 % Place 1 application into the nose 2 (two) times daily. 22 g 0  . nicotine (NICODERM CQ - DOSED IN MG/24 HOURS) 21 mg/24hr patch Place 1 patch (21 mg total) onto the skin daily. 28 patch 0  . ondansetron (ZOFRAN) 8 MG tablet Take 8 mg by mouth every 6 (six) hours as needed for nausea/vomiting.    . pantoprazole (PROTONIX) 40 MG tablet Take 40 mg by mouth daily.  5  . ranitidine (ZANTAC) 150 MG tablet Take 150 mg by mouth daily.     . sertraline  (ZOLOFT) 100 MG tablet Take 100 mg by mouth daily.   5  . sildenafil (REVATIO) 20 MG tablet 1-5 pills as needed for ED symptoms (Patient taking differently: Take 20-100 mg by mouth daily as needed (ED). )  30 tablet 0  . tiZANidine (ZANAFLEX) 4 MG tablet Take 1 tablet (4 mg total) by mouth every 6 (six) hours as needed for muscle spasms. 60 tablet 0   No current facility-administered medications for this visit.      Abtx:  Anti-infectives (From admission, onward)   None      REVIEW OF SYSTEMS:  Const: negative fever, negative chills, negative weight loss Eyes: negative diplopia or visual changes, negative eye pain ENT: negative coryza, negative sore throat Resp: negative cough, hemoptysis, dyspnea Cards: negative for chest pain, palpitations, lower extremity edema GU: negative for frequency, dysuria and hematuria GI: Negative for abdominal pain, diarrhea, bleeding, constipation Skin: negative for rash and pruritus Heme: negative for easy bruising and gum/nose bleeding MS: negative for myalgias, arthralgias, back pain and muscle weakness Neurolo:negative for headaches, has  dizziness, vertigo, memory problems  Psych: negative for feelings of anxiety, depression  Endocrine: has polyuria Allergy/Immunology- negative for any medication or food allergies ?  Objective:  VITALS:  BP 130/89 (BP Location: Left Arm, Patient Position: Sitting, Cuff Size: Normal)   Pulse 76   Temp (!) 97.5 F (36.4 C) (Oral)   Ht 5\' 10"  (1.778 m)   Wt 209 lb (94.8 kg)   BMI 29.99 kg/m  PHYSICAL EXAM: BP lying 121/77 HR 76 Standing 126/82- HR 76 General: Alert, cooperative, no distress, appears stated age.  Head: Normocephalic, without obvious abnormality, atraumatic. Eyes: Conjunctivae clear, anicteric sclerae. Pupils are equal ENT Nares normal. No drainage or sinus tenderness. Lips, mucosa, and tongue normal. No Thrush Neck: Supple, symmetrical, no adenopathy, thyroid: non tender no carotid bruit  and no JVD. Back: No CVA tenderness. Lungs: Clear to auscultation bilaterally. No Wheezing or Rhonchi. No rales. Heart: Regular rate and rhythm, no murmur, rub or gallop. Abdomen: Soft, non-tender,not distended. Bowel sounds normal. No masses Extremities: rt foot- plantar surgical wound almost healed Small opening   12/18/18    11/25/18    Skin: No rashes or lesions. Or bruising Lymph: Cervical, supraclavicular normal. Neurologic: Grossly non-focal Pertinent Labs Lab Results CBC 1/20 Hb 12.6 Glucose 317 Cr 1.18 vanco 19.6 1/23-cr N vanco 16   ? Impression/Recommendation ? ?Rt foot infection with MRSA s/p I/D and has been treated with IV vancomycin for 18 days. Wound is much improved. 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 But because of his wife and children having boils /abscess for many months they will all have to be decolonized at the same time. Gave information about personal hygiene , environmental hygiene and decolonization protocol with chlorhexidine and mupirocin- pt may need Oral antibiotics Will do nares swab today. Will contact him ith the result and send prescription for de-colonization then  DM- he says sugar today was  90s-last week lab his glucose was  314  Dizziness- no postural hypotension. Asked him to increase his fluid intake and follow up with is PCP ? _Explained to him and his wife in great detail __________________________________________________

## 2018-12-18 NOTE — Patient Instructions (Signed)
You are here for follow up of your rt foot wound ith MRSA- you have completed vancomycin and the foot looks good. You dont need any furtehr antibiotics- Tody e will do a nares swab for MRSA- your family has MRSA infections intermittently- if you have to be decolonized your hole family will have to as well   Preventive educational messages on personal hygiene and appropriate wound care are recommended for all patients with SSTI. Instructions should be provided to:  ? i. Keep draining wounds covered with clean, dry bandages . ? ii. Maintain good personal hygiene with regular bathing and cleaning of hands with soap and water or an alcohol-based hand gel, particularly after touching infected skin or an item that has directly contacted a draining wound  ? iii. Avoid reusing or sharing personal items (eg, disposable razors, linens, and towels) that have contacted infected skin  13. Environmental hygiene measures should be considered in patients with recurrent SSTI in the household or community setting:  ? i. Focus cleaning efforts on high-touch surfaces (ie, surfaces that come into frequent contact with people's bare skin each day, such as counters, door knobs, bath tubs, and toilet seats) that may contact bare skin or uncovered infections  ? ii. Commercially available cleaners or detergents appropriate for the surface being cleaned should be used according to label instructions for routine cleaning of surfaces  14. Decolonization may be considered in selected cases if:  ? i. A patient develops a recurrent SSTI despite optimizing wound care and hygiene measures  ? ii. Ongoing transmission is occurring among household members or other close contacts despite optimizing wound care and hygiene measures  15. Decolonization strategies should be offered in conjunction with ongoing reinforcement of hygiene measures and may include the following:  ? i. Nasal decolonization with mupirocin twice daily for 5-10 days  ?  ii. Nasal decolonization with mupirocin twice daily for 5-10 days and topical body decolonization regimens with a skin antiseptic solution (eg, chlorhexidine) for 5-14 days or dilute bleach baths. (For dilute bleach baths, 1 teaspoon per gallon of water [or  cup per  tub or 13 gallons of water] given for 15 min twice weekly for ~3 months can be considered.)  16. Oral antimicrobial therapy is recommended for the treatment of active infection only and is not routinely recommended for decolonization An oral agent in combination with rifampin, if the strain is susceptible, may be considered for decolonization if infections recur despite above measures  17. In cases where household or interpersonal transmission is suspected:  ? i. Personal and environmental hygiene measures in the patient and contacts are recommended  ? ii. Contacts should be evaluated for evidence of S. aureus infection: ? a. Symptomatic contacts should be evaluated and treated ; nasal and topical body decolonization strategies may be considered following treatment of active infection  ? b. Nasal and topical body decolonization of asymptomatic household contacts may be considered

## 2018-12-21 LAB — AEROBIC CULTURE W GRAM STAIN (SUPERFICIAL SPECIMEN)

## 2018-12-21 LAB — AEROBIC CULTURE  (SUPERFICIAL SPECIMEN): GRAM STAIN: NONE SEEN

## 2018-12-26 ENCOUNTER — Telehealth: Payer: Self-pay | Admitting: Licensed Clinical Social Worker

## 2018-12-26 ENCOUNTER — Other Ambulatory Visit: Payer: Self-pay | Admitting: Infectious Diseases

## 2018-12-26 ENCOUNTER — Other Ambulatory Visit: Payer: Self-pay | Admitting: Licensed Clinical Social Worker

## 2018-12-26 MED ORDER — MUPIROCIN CALCIUM 2 % EX CREA: 1.0000 "application " | TOPICAL_CREAM | Freq: Two times a day (BID) | CUTANEOUS | 0 refills | Status: DC

## 2018-12-26 MED ORDER — CHLORHEXIDINE GLUCONATE 4 % EX SOLN: 15.0000 mL | Freq: Every day | CUTANEOUS | 0 refills | Status: DC

## 2018-12-26 MED ORDER — RIFAMPIN 300 MG PO CAPS: 300.0000 mg | ORAL_CAPSULE | Freq: Two times a day (BID) | ORAL | 0 refills | Status: DC

## 2018-12-26 MED ORDER — SULFAMETHOXAZOLE-TRIMETHOPRIM 800-160 MG PO TABS: 1.0000 | ORAL_TABLET | Freq: Two times a day (BID) | ORAL | 0 refills | Status: DC

## 2018-12-26 NOTE — Telephone Encounter (Signed)
Spoke to the patient and gave him prescription for rifampin+ bactrim- asked him not to take atorvastatin. Also gave mupirocin and chlorhexidine wash. Explained the side effects and asked him to drink plenty of water. He will call on Monday to discuss his clinical status

## 2018-12-26 NOTE — Telephone Encounter (Signed)
Patient's wife [name] called stating that a foul smell is coming from the wound since yesterday, some drainage and tenderness. Patient states the tenderness is not out of the ordinary but the smell is. Patient is currently not on antibiotics.

## 2018-12-26 NOTE — Telephone Encounter (Signed)
Patient and wife have questions regarding the bactrim prescription and asked me to hold off on calling in the prescription until after they speak with you.

## 2019-01-01 DIAGNOSIS — E119 Type 2 diabetes mellitus without complications: Secondary | ICD-10-CM | POA: Insufficient documentation

## 2019-01-05 ENCOUNTER — Telehealth: Payer: Self-pay | Admitting: Licensed Clinical Social Worker

## 2019-01-05 NOTE — Telephone Encounter (Signed)
Calling patient to f/u on medications and to see how his foot is doing. Left message for the patient to call back.

## 2019-01-05 NOTE — Telephone Encounter (Signed)
thanks

## 2019-03-05 ENCOUNTER — Inpatient Hospital Stay
Admission: AD | Admit: 2019-03-05 | Discharge: 2019-03-10 | DRG: 617 | Disposition: A | Discharge status: Home Health | Payer: Medicaid Other | Source: Ambulatory Visit | Attending: Internal Medicine | Admitting: Internal Medicine

## 2019-03-05 ENCOUNTER — Other Ambulatory Visit: Payer: Self-pay

## 2019-03-05 ENCOUNTER — Inpatient Hospital Stay: Payer: Medicaid Other

## 2019-03-05 DIAGNOSIS — E1169 Type 2 diabetes mellitus with other specified complication: Secondary | ICD-10-CM | POA: Diagnosis not present

## 2019-03-05 DIAGNOSIS — M869 Osteomyelitis, unspecified: Secondary | ICD-10-CM | POA: Diagnosis present

## 2019-03-05 DIAGNOSIS — L089 Local infection of the skin and subcutaneous tissue, unspecified: Secondary | ICD-10-CM | POA: Diagnosis present

## 2019-03-05 DIAGNOSIS — E1069 Type 1 diabetes mellitus with other specified complication: Secondary | ICD-10-CM | POA: Diagnosis present

## 2019-03-05 DIAGNOSIS — Z8614 Personal history of Methicillin resistant Staphylococcus aureus infection: Secondary | ICD-10-CM | POA: Diagnosis not present

## 2019-03-05 DIAGNOSIS — F17210 Nicotine dependence, cigarettes, uncomplicated: Secondary | ICD-10-CM | POA: Diagnosis present

## 2019-03-05 DIAGNOSIS — L03115 Cellulitis of right lower limb: Secondary | ICD-10-CM | POA: Diagnosis present

## 2019-03-05 DIAGNOSIS — E11628 Type 2 diabetes mellitus with other skin complications: Secondary | ICD-10-CM | POA: Diagnosis present

## 2019-03-05 DIAGNOSIS — E10621 Type 1 diabetes mellitus with foot ulcer: Secondary | ICD-10-CM | POA: Diagnosis present

## 2019-03-05 DIAGNOSIS — Z794 Long term (current) use of insulin: Secondary | ICD-10-CM

## 2019-03-05 DIAGNOSIS — Z833 Family history of diabetes mellitus: Secondary | ICD-10-CM | POA: Diagnosis not present

## 2019-03-05 DIAGNOSIS — Z79899 Other long term (current) drug therapy: Secondary | ICD-10-CM | POA: Diagnosis not present

## 2019-03-05 DIAGNOSIS — L02611 Cutaneous abscess of right foot: Secondary | ICD-10-CM | POA: Diagnosis present

## 2019-03-05 DIAGNOSIS — F419 Anxiety disorder, unspecified: Secondary | ICD-10-CM | POA: Diagnosis present

## 2019-03-05 DIAGNOSIS — E119 Type 2 diabetes mellitus without complications: Secondary | ICD-10-CM

## 2019-03-05 DIAGNOSIS — B966 Bacteroides fragilis [B. fragilis] as the cause of diseases classified elsewhere: Secondary | ICD-10-CM | POA: Diagnosis not present

## 2019-03-05 DIAGNOSIS — L97519 Non-pressure chronic ulcer of other part of right foot with unspecified severity: Secondary | ICD-10-CM | POA: Diagnosis present

## 2019-03-05 DIAGNOSIS — B9562 Methicillin resistant Staphylococcus aureus infection as the cause of diseases classified elsewhere: Secondary | ICD-10-CM | POA: Diagnosis present

## 2019-03-05 DIAGNOSIS — L84 Corns and callosities: Secondary | ICD-10-CM | POA: Diagnosis not present

## 2019-03-05 DIAGNOSIS — E785 Hyperlipidemia, unspecified: Secondary | ICD-10-CM | POA: Diagnosis present

## 2019-03-05 DIAGNOSIS — E1040 Type 1 diabetes mellitus with diabetic neuropathy, unspecified: Secondary | ICD-10-CM | POA: Diagnosis present

## 2019-03-05 DIAGNOSIS — F329 Major depressive disorder, single episode, unspecified: Secondary | ICD-10-CM | POA: Diagnosis present

## 2019-03-05 DIAGNOSIS — I10 Essential (primary) hypertension: Secondary | ICD-10-CM | POA: Diagnosis present

## 2019-03-05 DIAGNOSIS — Z79891 Long term (current) use of opiate analgesic: Secondary | ICD-10-CM | POA: Diagnosis not present

## 2019-03-05 DIAGNOSIS — Z9641 Presence of insulin pump (external) (internal): Secondary | ICD-10-CM | POA: Diagnosis present

## 2019-03-05 DIAGNOSIS — B9689 Other specified bacterial agents as the cause of diseases classified elsewhere: Secondary | ICD-10-CM | POA: Diagnosis not present

## 2019-03-05 DIAGNOSIS — E876 Hypokalemia: Secondary | ICD-10-CM | POA: Diagnosis present

## 2019-03-05 DIAGNOSIS — Z89421 Acquired absence of other right toe(s): Secondary | ICD-10-CM | POA: Diagnosis not present

## 2019-03-05 LAB — BASIC METABOLIC PANEL
Anion gap: 11 (ref 5–15)
BUN: 24 mg/dL — ABNORMAL HIGH (ref 6–20)
CO2: 23 mmol/L (ref 22–32)
Calcium: 8.5 mg/dL — ABNORMAL LOW (ref 8.9–10.3)
Chloride: 97 mmol/L — ABNORMAL LOW (ref 98–111)
Creatinine, Ser: 1.64 mg/dL — ABNORMAL HIGH (ref 0.61–1.24)
GFR calc Af Amer: 59 mL/min — ABNORMAL LOW (ref >60)
GFR calc non Af Amer: 51 mL/min — ABNORMAL LOW (ref >60)
Glucose, Bld: 334 mg/dL — ABNORMAL HIGH (ref 70–99)
Potassium: 3.6 mmol/L (ref 3.5–5.1)
Sodium: 131 mmol/L — ABNORMAL LOW (ref 135–145)

## 2019-03-05 LAB — CBC
HCT: 30.2 % — ABNORMAL LOW (ref 39.0–52.0)
Hemoglobin: 9.5 g/dL — ABNORMAL LOW (ref 13.0–17.0)
MCH: 27.5 pg (ref 26.0–34.0)
MCHC: 31.5 g/dL (ref 30.0–36.0)
MCV: 87.5 fL (ref 80.0–100.0)
Platelets: 244 10*3/uL (ref 150–400)
RBC: 3.45 MIL/uL — ABNORMAL LOW (ref 4.22–5.81)
RDW: 14.1 % (ref 11.5–15.5)
WBC: 11.2 10*3/uL — ABNORMAL HIGH (ref 4.0–10.5)
nRBC: 0 % (ref 0.0–0.2)

## 2019-03-05 LAB — GLUCOSE, CAPILLARY
Glucose-Capillary: 333 mg/dL — ABNORMAL HIGH (ref 70–99)
Glucose-Capillary: 258 mg/dL — ABNORMAL HIGH (ref 70–99)
Glucose-Capillary: 223 mg/dL — ABNORMAL HIGH (ref 70–99)

## 2019-03-05 LAB — SURGICAL PCR SCREEN
MRSA, PCR: POSITIVE — AB
Staphylococcus aureus: POSITIVE — AB

## 2019-03-05 MED ORDER — PANTOPRAZOLE SODIUM 40 MG PO TBEC
40.0000 mg | DELAYED_RELEASE_TABLET | Freq: Every day | ORAL | Status: DC
  Administered 2019-03-07 – 2019-03-10 (×4): 40 mg via ORAL
  Filled 2019-03-05 (×4): qty 1

## 2019-03-05 MED ORDER — ATORVASTATIN CALCIUM 20 MG PO TABS: 20.0000 mg | ORAL_TABLET | Freq: Every day | ORAL | Status: DC

## 2019-03-05 MED ORDER — ONDANSETRON HCL 4 MG PO TABS: 4.0000 mg | ORAL_TABLET | Freq: Four times a day (QID) | ORAL | Status: DC | PRN

## 2019-03-05 MED ORDER — LISINOPRIL 5 MG PO TABS
5.0000 mg | ORAL_TABLET | Freq: Every day | ORAL | Status: DC
  Administered 2019-03-07 – 2019-03-10 (×4): 5 mg via ORAL
  Filled 2019-03-05 (×4): qty 1

## 2019-03-05 MED ORDER — POLYETHYLENE GLYCOL 3350 17 G PO PACK: 17.0000 g | PACK | Freq: Every day | ORAL | Status: DC | PRN

## 2019-03-05 MED ORDER — SODIUM CHLORIDE 0.9 % IV SOLN
2.0000 g | Freq: Three times a day (TID) | INTRAVENOUS | Status: DC
  Administered 2019-03-05 – 2019-03-09 (×11): 2 g via INTRAVENOUS
  Filled 2019-03-05 (×13): qty 2

## 2019-03-05 MED ORDER — BUSPIRONE HCL 10 MG PO TABS
10.0000 mg | ORAL_TABLET | Freq: Two times a day (BID) | ORAL | Status: DC
  Administered 2019-03-05 – 2019-03-10 (×9): 10 mg via ORAL
  Filled 2019-03-05 (×12): qty 1

## 2019-03-05 MED ORDER — ACETAMINOPHEN 650 MG RE SUPP: 650.0000 mg | Freq: Four times a day (QID) | RECTAL | Status: DC | PRN

## 2019-03-05 MED ORDER — INSULIN PUMP
Freq: Three times a day (TID) | SUBCUTANEOUS | Status: DC
  Administered 2019-03-05 – 2019-03-06 (×5): via SUBCUTANEOUS

## 2019-03-05 MED ORDER — SODIUM CHLORIDE 0.9 % IV SOLN
2.0000 g | Freq: Once | INTRAVENOUS | Status: AC
  Administered 2019-03-05: 16:00:00 2 g via INTRAVENOUS
  Filled 2019-03-05: qty 2

## 2019-03-05 MED ORDER — TIZANIDINE HCL 4 MG PO TABS
4.0000 mg | ORAL_TABLET | Freq: Four times a day (QID) | ORAL | Status: DC | PRN
  Filled 2019-03-05: qty 1

## 2019-03-05 MED ORDER — SODIUM CHLORIDE 0.9 % IV SOLN
2.0000 g | Freq: Three times a day (TID) | INTRAVENOUS | Status: DC
  Filled 2019-03-05 (×3): qty 2

## 2019-03-05 MED ORDER — GABAPENTIN 300 MG PO CAPS
600.0000 mg | ORAL_CAPSULE | Freq: Every day | ORAL | Status: DC
  Administered 2019-03-05 – 2019-03-09 (×5): 600 mg via ORAL
  Filled 2019-03-05 (×5): qty 2

## 2019-03-05 MED ORDER — ATORVASTATIN CALCIUM 20 MG PO TABS
40.0000 mg | ORAL_TABLET | Freq: Every day | ORAL | Status: DC
  Administered 2019-03-06 – 2019-03-09 (×4): 40 mg via ORAL
  Filled 2019-03-05 (×5): qty 2

## 2019-03-05 MED ORDER — CHLORHEXIDINE GLUCONATE 4 % EX LIQD
60.0000 mL | Freq: Once | CUTANEOUS | Status: AC
  Administered 2019-03-06: 4 via TOPICAL

## 2019-03-05 MED ORDER — CHLORHEXIDINE GLUCONATE CLOTH 2 % EX PADS
6.0000 | MEDICATED_PAD | Freq: Every day | CUTANEOUS | Status: DC
  Administered 2019-03-08: 06:00:00 6 via TOPICAL

## 2019-03-05 MED ORDER — NICOTINE 21 MG/24HR TD PT24
21.0000 mg | MEDICATED_PATCH | Freq: Every day | TRANSDERMAL | Status: DC
  Filled 2019-03-05 (×3): qty 1

## 2019-03-05 MED ORDER — SERTRALINE HCL 50 MG PO TABS
100.0000 mg | ORAL_TABLET | Freq: Every day | ORAL | Status: DC
  Administered 2019-03-07 – 2019-03-10 (×4): 100 mg via ORAL
  Filled 2019-03-05 (×4): qty 2

## 2019-03-05 MED ORDER — FAMOTIDINE 20 MG PO TABS
20.0000 mg | ORAL_TABLET | Freq: Two times a day (BID) | ORAL | Status: DC
  Administered 2019-03-05 – 2019-03-10 (×9): 20 mg via ORAL
  Filled 2019-03-05 (×9): qty 1

## 2019-03-05 MED ORDER — VANCOMYCIN HCL 10 G IV SOLR
1250.0000 mg | INTRAVENOUS | Status: DC
  Administered 2019-03-06: 06:00:00 1250 mg via INTRAVENOUS
  Filled 2019-03-05 (×2): qty 1250

## 2019-03-05 MED ORDER — OXYCODONE HCL 5 MG PO TABS
5.0000 mg | ORAL_TABLET | ORAL | Status: DC | PRN
  Administered 2019-03-05 – 2019-03-10 (×16): 5 mg via ORAL
  Filled 2019-03-05 (×16): qty 1

## 2019-03-05 MED ORDER — LINACLOTIDE 72 MCG PO CAPS
72.0000 ug | ORAL_CAPSULE | Freq: Every day | ORAL | Status: DC | PRN
  Filled 2019-03-05: qty 1

## 2019-03-05 MED ORDER — ONDANSETRON HCL 4 MG/2ML IJ SOLN: 4.0000 mg | Freq: Four times a day (QID) | INTRAMUSCULAR | Status: DC | PRN

## 2019-03-05 MED ORDER — AMILORIDE HCL 5 MG PO TABS
5.0000 mg | ORAL_TABLET | Freq: Every day | ORAL | Status: DC
  Administered 2019-03-07 – 2019-03-10 (×4): 5 mg via ORAL
  Filled 2019-03-05 (×6): qty 1

## 2019-03-05 MED ORDER — GADOBUTROL 1 MMOL/ML IV SOLN
10.0000 mL | Freq: Once | INTRAVENOUS | Status: AC | PRN
  Administered 2019-03-05: 10 mL via INTRAVENOUS

## 2019-03-05 MED ORDER — VANCOMYCIN HCL 10 G IV SOLR
2000.0000 mg | Freq: Once | INTRAVENOUS | Status: AC
  Administered 2019-03-05: 2000 mg via INTRAVENOUS
  Filled 2019-03-05: qty 2000

## 2019-03-05 MED ORDER — VANCOMYCIN VARIABLE DOSE PER UNSTABLE RENAL FUNCTION (PHARMACIST DOSING): Status: DC

## 2019-03-05 MED ORDER — ACETAMINOPHEN 325 MG PO TABS
650.0000 mg | ORAL_TABLET | Freq: Four times a day (QID) | ORAL | Status: DC | PRN
  Administered 2019-03-08 – 2019-03-09 (×2): 650 mg via ORAL
  Filled 2019-03-05 (×2): qty 2

## 2019-03-05 MED ORDER — MUPIROCIN 2 % EX OINT
1.0000 "application " | TOPICAL_OINTMENT | Freq: Two times a day (BID) | CUTANEOUS | Status: DC
  Administered 2019-03-05 – 2019-03-09 (×8): 1 via NASAL
  Filled 2019-03-05: qty 22

## 2019-03-05 NOTE — H&P (Signed)
[facility] [place] [facility]   PATIENT NAME: Richard LeepMichael Mendoza    MR#:  [id number]  DATE OF BIRTH:  1975/12/31  DATE OF ADMISSION:  (Not on file)  PRIMARY CARE PHYSICIAN: [facility]   REQUESTING/REFERRING PHYSICIAN: Gwyneth RevelsJustin Fowler, DPM  CHIEF COMPLAINT:   Infected right diabetic foot ulcer   HISTORY OF PRESENT ILLNESS:  Richard LeepMichael Mendoza  is a 43 y.o. male with a known history of hypertension, type 2 diabetes, hyperlipidemia who was directly admitted from the podiatry office for worsening infection of a right diabetic foot ulcer.  Patient has been dealing with this foot ulcer for quite some time.  He last underwent I&D 11/26/18.  After that surgery, he had a PICC line placed and received IV vancomycin and cefepime for 2 weeks.  He had been doing pretty well as an outpatient, but then three days ago, his foot infection got much worse. He noticed increased erythema and swelling of the top and bottom of his foot, as well as his 4th toe. He noted clear/tan drainage. He denies any fevers or chills. He went to see his podiatrist this morning and had a right foot x-ray that showed osteomyelitis. He was directly admitted for further management.  PAST MEDICAL HISTORY:   Past Medical History:  Diagnosis Date  . DIABETES MELLITUS, TYPE II, UNCONTROLLED 03/17/2009  . DM 12/08/2008  . HYPERLIPIDEMIA 03/17/2009  . HYPERTENSION 12/08/2008  . 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 ANTERIOR CERVICAL DECOMP/DISCECTOMY FUSION N/A 09/09/2017   Procedure: ANTERIOR CERVICAL DECOMPRESSION/DISCECTOMY FUSION CERVICAL 6- CERVICAL 7;  Surgeon: Coletta Memosabbell, Kyle, MD;  Location: [facility] OR;  Service: Neurosurgery;  Laterality: N/A;  ANTERIOR CERVICAL DECOMPRESSION/DISCECTOMY FUSION CERVICAL 6- CERVICAL 7  . APPENDECTOMY    . I&D EXTREMITY Right 10/03/2017   Procedure: IRRIGATION AND DEBRIDEMENT RIGHT WRIST;  Surgeon: Betha LoaKuzma,  Kevin, MD;  Location: [facility] OR;  Service: Orthopedics;  Laterality: Right;  . I&D EXTREMITY Right 11/26/2018   Procedure: IRRIGATION AND DEBRIDEMENT FASCIA ON RIGHT FOOT;  Surgeon: Gwyneth RevelsFowler, Justin, DPM;  Location: [facility] ORS;  Service: Podiatry;  Laterality: Right;  . osteomylitis    . ROTATOR CUFF REPAIR Left     SOCIAL HISTORY:   Social History   Tobacco Use  . Smoking status: Current Every Day Smoker    Packs/day: 1.00    Years: 17.00    Pack years: 17.00    Types: Cigarettes  . Smokeless tobacco: Never Used  . Tobacco comment: currently smoking 1 ppd.    Substance Use Topics  . Alcohol use: Yes    Frequency: Never    Comment: rare    FAMILY HISTORY:   Family History  Problem Relation Age of Onset  . Diabetes Mother   . Heart disease Father   . Diabetes Father   . Arthritis Other   . Hyperlipidemia Other   . Hypertension Other   . Cancer Other        breast    DRUG ALLERGIES:  No Known Allergies  REVIEW OF SYSTEMS:   Review of Systems  Constitutional: Negative for chills and fever.  HENT: Negative for congestion and sore throat.   Eyes: Negative for blurred vision and double vision.  Respiratory: Negative for cough and shortness of breath.   Cardiovascular: Negative for chest pain and palpitations.  Gastrointestinal: Negative for nausea and vomiting.  Genitourinary: Negative for dysuria and urgency.  Musculoskeletal: Positive for  joint pain. Negative for back pain and neck pain.  Neurological: Negative for dizziness and headaches.  Psychiatric/Behavioral: Negative for depression. The patient is not nervous/anxious.     MEDICATIONS AT HOME:   Prior to Admission medications   Medication Sig Start Date End Date Taking? Authorizing Provider  acetaminophen (TYLENOL) 325 MG tablet Take 2 tablets (650 mg total) by mouth every 6 (six) hours as needed for mild pain (or Fever >/= 101). 11/29/18   Gouru, Deanna Artis, MD  aMILoride (MIDAMOR) 5 MG tablet Take 1 tablet (5 mg  total) by mouth daily. 11/30/18   Gouru, Deanna Artis, MD  atorvastatin (LIPITOR) 20 MG tablet Take 20 mg by mouth daily. 08/23/17   [provider]  busPIRone (BUSPAR) 10 MG tablet Take 10 mg by mouth 2 (two) times daily.    [provider]  Chlorhexidine Gluconate 4 % SOLN Apply 15 mLs topically daily. 12/26/18   Lynn Ito, MD  [facility] SENNA 8.6 MG tablet Take 1 tablet by mouth 2 (two) times daily.  10/31/17   [provider]  dimenhyDRINATE (DRAMAMINE) 50 MG tablet Take 50 mg by mouth 2 (two) times daily as needed for itching or dizziness.     [provider]  famotidine (PEPCID) 20 MG tablet Take 20 mg by mouth 2 (two) times daily.    [provider]  gabapentin (NEURONTIN) 300 MG capsule Take 600 mg by mouth at bedtime.     [provider]  HYDROcodone-acetaminophen (NORCO/VICODIN) 5-325 MG tablet Take 1-2 tablets by mouth every 6 (six) hours as needed for moderate pain. 11/29/18   Ramonita Lab, MD  ibuprofen (ADVIL,MOTRIN) 200 MG tablet Take 2-3 tablets (400-600 mg total) by mouth every 6 (six) hours as needed for moderate pain. 11/29/18   Gouru, Deanna Artis, MD  insulin aspart (NOVOLOG) 100 UNIT/ML injection Inject into the skin 3 (three) times daily before meals. Via pump    [provider]  Insulin Human (INSULIN PUMP) SOLN Inject 2.5 each into the skin every hour.    [provider]  linaclotide (LINZESS) 145 MCG CAPS capsule Take 72 mcg by mouth daily as needed (GI symptoms).     [provider]  lisinopril (PRINIVIL,ZESTRIL) 5 MG tablet Take 5 mg by mouth daily. 10/14/17   [provider]  metoCLOPramide (REGLAN) 10 MG tablet Take 1 tablet (10 mg total) by mouth every 6 (six) hours. Patient taking differently: Take 10 mg by mouth 2 (two) times daily.  11/17/17   Elson Areas, PA-C  mupirocin cream (BACTROBAN) 2 % Apply 1 application topically 2 (two) times daily. 12/26/18   Lynn Ito, MD   mupirocin ointment (BACTROBAN) 2 % Place 1 application into the nose 2 (two) times daily. 11/29/18   Gouru, Deanna Artis, MD  nicotine (NICODERM CQ - DOSED IN MG/24 HOURS) 21 mg/24hr patch Place 1 patch (21 mg total) onto the skin daily. 11/30/18   Gouru, Deanna Artis, MD  ondansetron (ZOFRAN) 8 MG tablet Take 8 mg by mouth every 6 (six) hours as needed for nausea/vomiting.    [provider]  pantoprazole (PROTONIX) 40 MG tablet Take 40 mg by mouth daily. 08/23/17   [provider]  ranitidine (ZANTAC) 150 MG tablet Take 150 mg by mouth daily.     [provider]  rifampin (RIFADIN) 300 MG capsule Take 1 capsule (300 mg total) by mouth 2 (two) times daily. 12/26/18   Lynn Ito, MD  sertraline (ZOLOFT) 100 MG tablet Take 100 mg by mouth  daily.  10/31/17   [provider]  sildenafil (REVATIO) 20 MG tablet 1-5 pills as needed for ED symptoms Patient taking differently: Take 20-100 mg by mouth daily as needed (ED).  09/26/15   Romero Belling, MD  sulfamethoxazole-trimethoprim (BACTRIM DS) 800-160 MG tablet Take 1 tablet by mouth 2 (two) times daily. 12/26/18   Lynn Ito, MD  tiZANidine (ZANAFLEX) 4 MG tablet Take 1 tablet (4 mg total) by mouth every 6 (six) hours as needed for muscle spasms. 09/10/17   Coletta Memos, MD      VITAL SIGNS:  There were no vitals taken for this visit.  PHYSICAL EXAMINATION:  Physical Exam  GENERAL:  43 y.o.-year-old patient lying in the bed with no acute distress.  EYES: Pupils equal, round, reactive to light and accommodation. No scleral icterus. Extraocular muscles intact.  HEENT: Head atraumatic, normocephalic. Oropharynx and nasopharynx clear.  NECK:  Supple, no jugular venous distention. No thyroid enlargement, no tenderness.  LUNGS: Normal breath sounds bilaterally, no wheezing, rales,rhonchi or crepitation. No use of accessory muscles of respiration.  CARDIOVASCULAR: RRR, S1, S2 normal. No murmurs, rubs, or gallops.   ABDOMEN: Soft, nontender, nondistended. Bowel sounds present. No organomegaly or mass.  EXTREMITIES: No pedal edema, cyanosis, or clubbing. +dry bandage in place over right foot, right 4th toe appears erythematous and edematous, +skin breakdown visible. NEUROLOGIC: Cranial nerves II through XII are intact. Muscle strength 5/5 in all extremities. Sensation intact. Gait not checked.  PSYCHIATRIC: The patient is alert and oriented x 3.  SKIN: No obvious rash. +right foot ulcer covered in dry bandage.  LABORATORY PANEL:   CBC No results for input(s): WBC, HGB, HCT, PLT in the last 168 hours. ------------------------------------------------------------------------------------------------------------------  Chemistries  No results for input(s): NA, K, CL, CO2, GLUCOSE, BUN, CREATININE, CALCIUM, MG, AST, ALT, ALKPHOS, BILITOT in the last 168 hours.  Invalid input(s): GFRCGP ------------------------------------------------------------------------------------------------------------------  Cardiac Enzymes No results for input(s): TROPONINI in the last 168 hours. ------------------------------------------------------------------------------------------------------------------  RADIOLOGY:  No results found.    IMPRESSION AND PLAN:   Right diabetic foot ulcer infection with x-ray suggestive of osteomyelitis. No signs of sepsis on admission. Last I&D 11/26/18 with wound culture growing MRSA. Treated with vancomycin x 2 weeks via PICC. -Podiatry consult- plan for I&D tomorrow -Will also consult ID, who has been following him as an outpatient -Will make patient n.p.o. at midnight -MRI right foot -Check CBC and BMP -Start IV vancomycin and cefepime -Obtain blood cultures -Pain control  Chronic hypokalemia -Continue home amiloride (patient started on this by nephrology at last hospitalization) -Check electrolytes now and again in the morning  Hypertension -Continue home lisinopril  Type  2 diabetes- uses insulin pump at home -Continue novolog via insulin pump  Hyperlipidemia- stable -Increase lipitor to 40mg  daily  Depression/anxiety- stable -Continue home buspar and zoloft  DVT prophylaxis- SCDs, plan to start lovenox after surgery tomorrow  All the records are reviewed and case discussed with ED provider. Management plans discussed with the patient, family and they are in agreement.  CODE STATUS: Full  TOTAL TIME TAKING CARE OF THIS PATIENT: 45 minutes.    Jinny Blossom Corrie Reder M.D on 03/05/2019 at 12:36 PM  Between 7am to 6pm - Pager - [phone]  After 6pm go to [email or web address] - Social research officer, government  [facility] [place] [facility]  Office  [phone]  CC: Primary care physician; Raynelle Bring   Note: This dictation was prepared with Dragon dictation along with smaller phrase technology. Any transcriptional errors that result from  this process are unintentional.

## 2019-03-05 NOTE — Consult Note (Addendum)
Pharmacy Antibiotic Note  Richard Mendoza is a 42 y.o. male admitted on 03/05/2019 with Osteomyelitis.  Pharmacy has been consulted for cefepime and vancomycin dosing.  Plan:  Will give a loading dose of vancomycin 2000 mg x 1. Pt is in potential AKI. Baseline creatinine 0.9-1, currently at 1.64.  Will dose by levels. Will order a 24 hour for tomorrow and reassess.   Will order cefepime 2 g q8H.        Temp (24hrs), Avg:98 F (36.7 C), Min:98 F (36.7 C), Max:98 F (36.7 C)  No results for input(s): WBC, CREATININE, LATICACIDVEN, VANCOTROUGH, VANCOPEAK, VANCORANDOM, GENTTROUGH, GENTPEAK, GENTRANDOM, TOBRATROUGH, TOBRAPEAK, TOBRARND, AMIKACINPEAK, AMIKACINTROU, AMIKACIN in the last 168 hours.  CrCl cannot be calculated (Patient's most recent lab result is older than the maximum 21 days allowed.).    No Known Allergies  Antimicrobials this admission: 4/16 cefepime >>  4/16 vancomycin >>   Dose adjustments this admission: None   Microbiology results: None   Thank you for allowing pharmacy to be a part of this patient's care.  Ronnald Ramp, PharmD, BCPS  03/05/2019 1:55 PM

## 2019-03-05 NOTE — Consult Note (Addendum)
NAME: Richard Mendoza  DOB: Nov 26, 1975  MRN: [id number]  Date/Time: 03/05/2019 5:39 PM  REQUESTING PROVIDER: Nancy Marus Subjective:  REASON FOR CONSULT: rt foot infection ?C.C swelling, pain and pus rt foot x 3 days Richard Mendoza is a 43 y.o. male with a history of DM, was in [facility] between 11/25/18-11/29/18 for rt foot infection- Had an abscess on the plantar aspect which was I/D by Dr.Fowler and it was MRSA  . He was sent home on vancomycin for 2 weeks following which he took bactrim + rifampin for decolonization. His wife and children also had MRSA and they were told to go their PCP and decolonize . He appraently was doing well until 3 days ago when his rt foot started to get red. He went to Dr.Cline's office today and was asked to get admitted. He has no fever, no trauma to the foot- he did have a callus on the plantar surface which he was keeping clean  Past Medical History:  Diagnosis Date  . DIABETES MELLITUS, TYPE II, UNCONTROLLED 03/17/2009  . DM 12/08/2008  . HYPERLIPIDEMIA 03/17/2009  . HYPERTENSION 12/08/2008  . 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 ANTERIOR CERVICAL DECOMP/DISCECTOMY FUSION N/A 09/09/2017   Procedure: ANTERIOR CERVICAL DECOMPRESSION/DISCECTOMY FUSION CERVICAL 6- CERVICAL 7;  Surgeon: Coletta Memos, MD;  Location: [facility] OR;  Service: Neurosurgery;  Laterality: N/A;  ANTERIOR CERVICAL DECOMPRESSION/DISCECTOMY FUSION CERVICAL 6- CERVICAL 7  . APPENDECTOMY    . I&D EXTREMITY Right 10/03/2017   Procedure: IRRIGATION AND DEBRIDEMENT RIGHT WRIST;  Surgeon: Betha Loa, MD;  Location: [facility] OR;  Service: Orthopedics;  Laterality: Right;  . I&D EXTREMITY Right 11/26/2018   Procedure: IRRIGATION AND DEBRIDEMENT FASCIA ON RIGHT FOOT;  Surgeon: Gwyneth Revels, DPM;  Location: [facility] ORS;  Service: Podiatry;  Laterality: Right;  . osteomylitis    . ROTATOR CUFF REPAIR Left     Social History   Socioeconomic History  . Marital status: Married   Spouse name: Not on file  . Number of children: Not on file  . Years of education: Not on file  . Highest education level: Not on file  Occupational History  . Not on file  Social Needs  . Financial resource strain: Not on file  . Food insecurity:    Worry: Not on file    Inability: Not on file  . Transportation needs:    Medical: Not on file    Non-medical: Not on file  Tobacco Use  . Smoking status: Current Every Day Smoker    Packs/day: 1.00    Years: 17.00    Pack years: 17.00    Types: Cigarettes  . Smokeless tobacco: Never Used  . Tobacco comment: currently smoking 1 ppd.    Substance and Sexual Activity  . Alcohol use: Yes    Frequency: Never    Comment: rare  . Drug use: No  . Sexual activity: Yes  Lifestyle  . Physical activity:    Days per week: Not on file    Minutes per session: Not on file  . Stress: Not on file  Relationships  . Social connections:    Talks on phone: Not on file    Gets together: Not on file    Attends religious service: Not on file    Active member of club or organization: Not on file    Attends meetings of clubs or organizations: Not on file    Relationship status: Not on file  . Intimate  partner violence:    Fear of current or ex partner: Not on file    Emotionally abused: Not on file    Physically abused: Not on file    Forced sexual activity: Not on file  Other Topics Concern  . Not on file  Social History Narrative  . Not on file    Family History  Problem Relation Age of Onset  . Diabetes Mother   . Heart disease Father   . Diabetes Father   . Arthritis Other   . Hyperlipidemia Other   . Hypertension Other   . Cancer Other        breast   No Known Allergies  ? Current Facility-Administered Medications  Medication Dose Route Frequency Provider Last Rate Last Dose  . acetaminophen (TYLENOL) tablet 650 mg  650 mg Oral Q6H PRN Mayo, Allyn Kenner, MD       Or  . acetaminophen (TYLENOL) suppository 650 mg  650 mg Rectal  Q6H PRN Mayo, Allyn Kenner, MD      . aMILoride [facility]) tablet 5 mg  5 mg Oral Daily Mayo, Allyn Kenner, MD      . atorvastatin (LIPITOR) tablet 40 mg  40 mg Oral q1800 Mayo, Allyn Kenner, MD      . busPIRone (BUSPAR) tablet 10 mg  10 mg Oral BID Mayo, Allyn Kenner, MD      . ceFEPIme (MAXIPIME) 2 g in sodium chloride 0.9 % 100 mL IVPB  2 g Intravenous Q8H Lowella Bandy, RPH      . famotidine (PEPCID) tablet 20 mg  20 mg Oral BID Mayo, Allyn Kenner, MD      . gabapentin (NEURONTIN) capsule 600 mg  600 mg Oral QHS Mayo, Allyn Kenner, MD      . insulin pump   Subcutaneous TID AC, HS, 0200 Mayo, Allyn Kenner, MD      . linaclotide [facility]) capsule 72 mcg  72 mcg Oral Daily PRN Mayo, Allyn Kenner, MD      . lisinopril (PRINIVIL,ZESTRIL) tablet 5 mg  5 mg Oral Daily Mayo, Allyn Kenner, MD      . nicotine (NICODERM CQ - dosed in mg/24 hours) patch 21 mg  21 mg Transdermal Daily Mayo, Allyn Kenner, MD      . ondansetron [facility]) tablet 4 mg  4 mg Oral Q6H PRN Mayo, Allyn Kenner, MD       Or  . ondansetron (ZOFRAN) injection 4 mg  4 mg Intravenous Q6H PRN Mayo, Allyn Kenner, MD      . oxyCODONE (Oxy IR/ROXICODONE) immediate release tablet 5 mg  5 mg Oral Q4H PRN Mayo, Allyn Kenner, MD      . pantoprazole (PROTONIX) EC tablet 40 mg  40 mg Oral Daily Mayo, Allyn Kenner, MD      . polyethylene glycol (MIRALAX / GLYCOLAX) packet 17 g  17 g Oral Daily PRN Mayo, Allyn Kenner, MD      . sertraline (ZOLOFT) tablet 100 mg  100 mg Oral Daily Mayo, Allyn Kenner, MD      . tiZANidine (ZANAFLEX) tablet 4 mg  4 mg Oral Q6H PRN Mayo, Allyn Kenner, MD      . Melene Muller ON 03/06/2019] vancomycin (VANCOCIN) 1,250 mg in sodium chloride 0.9 % 250 mL IVPB  1,250 mg Intravenous Q24H Lowella Bandy, RPH      . vancomycin (VANCOCIN) 2,000 mg in sodium chloride 0.9 % 500 mL IVPB  2,000 mg Intravenous Once Mayo, Allyn Kenner, MD 250 mL/hr  at 03/05/19 1655 2,000 mg at 03/05/19 1655  . vancomycin variable dose per unstable renal function (pharmacist dosing)   Does not apply See  admin instructions Ronnald Rampatel, Kishan S, RPH         Abtx:  Anti-infectives (From admission, onward)   Start     Dose/Rate Route Frequency Ordered Stop   03/06/19 0600  vancomycin (VANCOCIN) 1,250 mg in sodium chloride 0.9 % 250 mL IVPB     1,250 mg 166.7 mL/hr over 90 Minutes Intravenous Every 24 hours 03/05/19 1519     03/05/19 2200  ceFEPIme (MAXIPIME) 2 g in sodium chloride 0.9 % 100 mL IVPB  Status:  Discontinued     2 g 200 mL/hr over 30 Minutes Intravenous Every 8 hours 03/05/19 1519 03/05/19 1529   03/05/19 2200  ceFEPIme (MAXIPIME) 2 g in sodium chloride 0.9 % 100 mL IVPB     2 g 200 mL/hr over 30 Minutes Intravenous Every 8 hours 03/05/19 1519     03/05/19 1517  vancomycin variable dose per unstable renal function (pharmacist dosing)      Does not apply See admin instructions 03/05/19 1517     03/05/19 1500  vancomycin (VANCOCIN) 2,000 mg in sodium chloride 0.9 % 500 mL IVPB     2,000 mg 250 mL/hr over 120 Minutes Intravenous  Once 03/05/19 1410     03/05/19 1430  ceFEPIme (MAXIPIME) 2 g in sodium chloride 0.9 % 100 mL IVPB     2 g 200 mL/hr over 30 Minutes Intravenous  Once 03/05/19 1410 03/05/19 1600      REVIEW OF SYSTEMS:  Const: negative fever, negative chills, negative weight loss Eyes: negative diplopia or visual changes, negative eye pain ENT: negative coryza, negative sore throat Resp: negative cough, hemoptysis, dyspnea Cards: negative for chest pain, palpitations, lower extremity edema GU: negative for frequency, dysuria and hematuria GI: Negative for abdominal pain, diarrhea, bleeding, constipation Skin: negative for rash and pruritus Heme: negative for easy bruising and gum/nose bleeding MS: negative for myalgias, arthralgias, back pain and muscle weakness Neurolo:negative for headaches, dizziness, vertigo, memory problems  Psych: negative for feelings of anxiety, depression  Endocrine: negative for thyroid, diabetes Allergy/Immunology- negative for any  medication or food allergies ?  Objective:  VITALS:  BP 137/87 (BP Location: Left Arm)   Pulse 83   Temp 98.1 F (36.7 C) (Oral)   Resp 18   Ht 5\' 10"  (1.778 m)   Wt 97.1 kg   SpO2 100%   BMI 30.71 kg/m  PHYSICAL EXAM:  General: Alert, cooperative, no distress, appears stated age.  Head: Normocephalic, without obvious abnormality, atraumatic. Eyes: Conjunctivae clear, anicteric sclerae. Pupils are equal ENT Nares normal. No drainage or sinus tenderness. Lips, mucosa, and tongue normal. No Thrush Neck: Supple, symmetrical, no adenopathy, thyroid: non tender no carotid bruit and no JVD. Back: No CVA tenderness. Lungs: Clear to auscultation bilaterally. No Wheezing or Rhonchi. No rales. Heart: Regular rate and rhythm, no murmur, rub or gallop. Abdomen: Soft, non-tender,not distended. Bowel sounds normal. No masses Extremities: rt foot is wrapped- saw picture    Skin: No rashes or lesions. Or bruising Lymph: Cervical, supraclavicular normal. Neurologic: Grossly non-focal Pertinent Labs Lab Results CBC    Component Value Date/Time   WBC 11.2 (H) 03/05/2019 1422   RBC 3.45 (L) 03/05/2019 1422   HGB 9.5 (L) 03/05/2019 1422   HCT 30.2 (L) 03/05/2019 1422   PLT 244 03/05/2019 1422   MCV 87.5 03/05/2019 1422  MCH 27.5 03/05/2019 1422   MCHC 31.5 03/05/2019 1422   RDW 14.1 03/05/2019 1422   LYMPHSABS 3.4 08/01/2011 0124   MONOABS 0.7 08/01/2011 0124   EOSABS 0.2 08/01/2011 0124   BASOSABS 0.0 08/01/2011 0124    CMP Latest Ref Rng & Units 03/05/2019 11/29/2018 11/28/2018  Glucose 70 - 99 mg/dL 161(W) 960(A) -  BUN 6 - 20 mg/dL 54(U) 15 -  Creatinine 0.61 - 1.24 mg/dL 9.81(X) 9.14 7.82  Sodium 135 - 145 mmol/L 131(L) 140 -  Potassium 3.5 - 5.1 mmol/L 3.6 3.6 3.4(L)  Chloride 98 - 111 mmol/L 97(L) 112(H) -  CO2 22 - 32 mmol/L 23 23 -  Calcium 8.9 - 10.3 mg/dL 9.5(A) 2.1(H) -  Total Protein 6.5 - 8.1 g/dL - - -  Total Bilirubin 0.3 - 1.2 mg/dL - - -  Alkaline Phos 38 -  126 U/L - - -  AST 15 - 41 U/L - - -  ALT 0 - 44 U/L - - -      Microbiology: No results found for this or any previous visit (from the past 240 hour(s)).  IMAGING RESULTS:MRI pending ? Impression/Recommendation ?43 y.o. male with a history of DM, was in [facility] between 11/25/18-11/29/18 for rt foot infection- Had an abscess on the plantar aspect which was I/D by Dr.Fowler and it was MRSA  . He was sent home on vancomycin for 2 weeks following which he took bactrim + rifampin for decolonization. His wife and children also had MRSA and they were told to go their PCP and decolonize . He appraently was doing well until 3 days ago when his rt foot started to get red. He went to Dr.Cline's office today and was asked to get admitted.  ? Diabetic foot infection with Rt foot Infection with abscess and cellulitis - recent MRSA infection on the same foot and had I/D in Jan 202 . MRI pending On vanco and cefepime He will have surgery tomorrow  DM says it got worse after the flare up- on insulin  HTn on lisinopril  Hyperlipidemia on atorvastatin  Depression/anxiety- on Buspar and sertraline Current smoker- now having nicoderm   ? ___________________________________________________ Discussed with patient

## 2019-03-05 NOTE — Consult Note (Signed)
ORTHOPAEDIC CONSULTATION  REQUESTING PHYSICIAN: Mayo, Pete Pelt, MD  Chief Complaint: Right foot infection.  HPI: Richard Mendoza is a 43 y.o. male who complains of  Worsening right foot infection.  S/p I & D in January and had previously been on long term po antibiotics.  Noticed progressive redness to right foot this week and seen outpt today in clinic with severe infection with osteomyelitis on xray.  Admitted for IV abx and surgical planning for tomorrow.  Past Medical History:  Diagnosis Date  . DIABETES MELLITUS, TYPE II, UNCONTROLLED 03/17/2009  . DM 12/08/2008  . HYPERLIPIDEMIA 03/17/2009  . HYPERTENSION 12/08/2008  . 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 ANTERIOR CERVICAL DECOMP/DISCECTOMY FUSION N/A 09/09/2017   Procedure: ANTERIOR CERVICAL DECOMPRESSION/DISCECTOMY FUSION CERVICAL 6- CERVICAL 7;  Surgeon: Ashok Pall, MD;  Location: [facility];  Service: Neurosurgery;  Laterality: N/A;  ANTERIOR CERVICAL DECOMPRESSION/DISCECTOMY FUSION CERVICAL 6- CERVICAL 7  . APPENDECTOMY    . I&D EXTREMITY Right 10/03/2017   Procedure: IRRIGATION AND DEBRIDEMENT RIGHT WRIST;  Surgeon: Leanora Cover, MD;  Location: [facility];  Service: Orthopedics;  Laterality: Right;  . I&D EXTREMITY Right 11/26/2018   Procedure: IRRIGATION AND DEBRIDEMENT FASCIA ON RIGHT FOOT;  Surgeon: Samara Deist, DPM;  Location: [facility];  Service: Podiatry;  Laterality: Right;  . osteomylitis    . ROTATOR CUFF REPAIR Left    Social History   Socioeconomic History  . Marital status: Married    Spouse name: Not on file  . Number of children: Not on file  . Years of education: Not on file  . Highest education level: Not on file  Occupational History  . Not on file  Social Needs  . Financial resource strain: Not on file  . Food insecurity:    Worry: Not on file    Inability: Not on file  . Transportation needs:    Medical: Not on file    Non-medical: Not on file  Tobacco  Use  . Smoking status: Current Every Day Smoker    Packs/day: 1.00    Years: 17.00    Pack years: 17.00    Types: Cigarettes  . Smokeless tobacco: Never Used  . Tobacco comment: currently smoking 1 ppd.    Substance and Sexual Activity  . Alcohol use: Yes    Frequency: Never    Comment: rare  . Drug use: No  . Sexual activity: Yes  Lifestyle  . Physical activity:    Days per week: Not on file    Minutes per session: Not on file  . Stress: Not on file  Relationships  . Social connections:    Talks on phone: Not on file    Gets together: Not on file    Attends religious service: Not on file    Active member of club or organization: Not on file    Attends meetings of clubs or organizations: Not on file    Relationship status: Not on file  Other Topics Concern  . Not on file  Social History Narrative  . Not on file   Family History  Problem Relation Age of Onset  . Diabetes Mother   . Heart disease Father   . Diabetes Father   . Arthritis Other   . Hyperlipidemia Other   . Hypertension Other   . Cancer Other        breast   No Known Allergies Prior to Admission medications   Medication Sig Start Date  End Date Taking? Authorizing Provider  acetaminophen (TYLENOL) 325 MG tablet Take 2 tablets (650 mg total) by mouth every 6 (six) hours as needed for mild pain (or Fever >/= 101). 11/29/18   Gouru, Illene Silver, MD  aMILoride (MIDAMOR) 5 MG tablet Take 1 tablet (5 mg total) by mouth daily. 11/30/18   Gouru, Illene Silver, MD  atorvastatin (LIPITOR) 20 MG tablet Take 20 mg by mouth daily. 08/23/17   [provider]  busPIRone (BUSPAR) 15 MG tablet Take 15 mg by mouth 2 (two) times daily.    [provider]  [facility] SENNA 8.6 MG tablet Take 1 tablet by mouth 2 (two) times daily.  10/31/17   [provider]  dimenhyDRINATE (DRAMAMINE) 50 MG tablet Take 50 mg by mouth 2 (two) times daily as needed for itching or dizziness.     [provider]  doxycycline  (VIBRA-TABS) 100 MG tablet Take 100 mg by mouth 2 (two) times daily. 03/03/19 03/13/19  [provider]  famotidine (PEPCID) 20 MG tablet Take 20 mg by mouth 2 (two) times daily.    [provider]  gabapentin (NEURONTIN) 300 MG capsule Take 600 mg by mouth at bedtime.     [provider]  HYDROcodone-acetaminophen (NORCO/VICODIN) 5-325 MG tablet Take 1-2 tablets by mouth every 6 (six) hours as needed for moderate pain. 11/29/18   Nicholes Mango, MD  ibuprofen (ADVIL,MOTRIN) 200 MG tablet Take 2-3 tablets (400-600 mg total) by mouth every 6 (six) hours as needed for moderate pain. 11/29/18   Gouru, Illene Silver, MD  insulin aspart (NOVOLOG) 100 UNIT/ML injection Inject into the skin 3 (three) times daily before meals. Via pump    [provider]  Insulin Human (INSULIN PUMP) SOLN Inject 2.5 each into the skin every hour.    [provider]  linaclotide (LINZESS) 72 MCG capsule Take 72 mcg by mouth daily as needed (constipation).    [provider]  lisinopril (PRINIVIL,ZESTRIL) 5 MG tablet Take 5 mg by mouth daily. 10/14/17   [provider]  metoCLOPramide (REGLAN) 10 MG tablet Take 1 tablet (10 mg total) by mouth every 6 (six) hours. Patient taking differently: Take 10 mg by mouth 2 (two) times daily.  11/17/17   Fransico Meadow, PA-C  ondansetron (ZOFRAN) 8 MG tablet Take 8 mg by mouth every 6 (six) hours as needed for nausea/vomiting.    [provider]  pantoprazole (PROTONIX) 40 MG tablet Take 40 mg by mouth daily. 08/23/17   [provider]  sertraline (ZOLOFT) 100 MG tablet Take 100 mg by mouth daily.  10/31/17   [provider]  sildenafil (REVATIO) 20 MG tablet 1-5 pills as needed for ED symptoms Patient taking differently: Take 20-100 mg by mouth daily as needed (ED).  09/26/15   Renato Shin, MD  tiZANidine (ZANAFLEX) 4 MG tablet Take 1 tablet (4 mg total) by mouth every 6 (six) hours as needed for muscle spasms.  09/10/17   Ashok Pall, MD  vitamin B-12 (CYANOCOBALAMIN) 1000 MCG tablet Take 1,000 mcg by mouth daily. 01/27/19   [provider]   No results found.  Positive ROS: All other systems have been reviewed and were otherwise negative with the exception of those mentioned in the HPI and as above.  12 point ROS was performed.  Physical Exam:  Performed in outpt clinic prior to admission today.  General: Alert and oriented.  No apparent distress.  Vascular:  Left foot:Dorsalis Pedis:  present Posterior Tibial:  present  Right foot: Dorsalis Pedis:  present Posterior Tibial:  present  Neuro:absent  Protective sensation  Derm:Severe redness with fluctuance surrounding 4th mtpj and plantar wound drainage.  Lateral 5th mtpj ulceration as well.  Cellulitis extending up foot to ankle.    Ortho/MS: Noted diffuse edema to right foot.   Assessment: DM with neuropathy Osteomyelitis right foot.  Plan: Plan for surgical debridement tomorrow. MRI tonight for surgical planning.  I d/w pt in office need for at minimum I & D and 4th toe amputation but very likely outcome will be trans-met amputation.  Will see what MRI shows.  Pre-op orders in place.  Will need long term IV abx and f/u with ID as well.     Elesa Hacker, DPM Cell [phone]   03/05/2019 7:25 PM

## 2019-03-05 NOTE — Consult Note (Signed)
Pharmacy Antibiotic Note  Richard Mendoza is a 43 y.o. male admitted on 03/05/2019 with Osteomyelitis of the right foot.  Pharmacy has been consulted for cefepime and vancomycin dosing.  He recently had a PICC line placed and received IV vancomycin and cefepime for 2 weeks. His dose of vancomycin was 1500 mg every 12 hours: levels were therapeutic. However, SCr is markedly elevated above his baseline  Plan:  1) Vancomycin 1250 mg IV Q 24 hrs after 2000 mg loading dose Goal AUC 400-550, BMI 30.7, T1/2 12.7h Expected AUC: 471.3 SCr used: 1.64   2) cefepime 2 grams IV every 8 hours  Height: 5\' 10"  (177.8 cm) Weight: 214 lb (97.1 kg) IBW/kg (Calculated) : 73  Antimicrobials this admission: 4/16 cefepime >>  4/16 vancomycin >>   Microbiology results: 4/16 BCx pending  Thank you for allowing pharmacy to be a part of this patient's care.  Lowella Bandy, PharmD 03/05/2019 2:29 PM

## 2019-03-06 ENCOUNTER — Inpatient Hospital Stay: Payer: Medicaid Other | Admitting: Anesthesiology

## 2019-03-06 ENCOUNTER — Encounter
Admission: AD | Disposition: A | Discharge status: Home Health | Payer: Self-pay | Source: Ambulatory Visit | Attending: Internal Medicine

## 2019-03-06 ENCOUNTER — Inpatient Hospital Stay: Admission: RE | Admit: 2019-03-06 | Payer: Medicaid Other | Source: Home / Self Care | Admitting: Podiatry

## 2019-03-06 HISTORY — PX: INCISION AND DRAINAGE: SHX5863

## 2019-03-06 LAB — BASIC METABOLIC PANEL
Anion gap: 8 (ref 5–15)
BUN: 21 mg/dL — ABNORMAL HIGH (ref 6–20)
CO2: 25 mmol/L (ref 22–32)
Calcium: 8.5 mg/dL — ABNORMAL LOW (ref 8.9–10.3)
Chloride: 101 mmol/L (ref 98–111)
Creatinine, Ser: 1.23 mg/dL (ref 0.61–1.24)
GFR calc Af Amer: >60 mL/min (ref >60)
GFR calc non Af Amer: >60 mL/min (ref >60)
Glucose, Bld: 339 mg/dL — ABNORMAL HIGH (ref 70–99)
Potassium: 3.7 mmol/L (ref 3.5–5.1)
Sodium: 134 mmol/L — ABNORMAL LOW (ref 135–145)

## 2019-03-06 LAB — GLUCOSE, CAPILLARY
Glucose-Capillary: 357 mg/dL — ABNORMAL HIGH (ref 70–99)
Glucose-Capillary: 268 mg/dL — ABNORMAL HIGH (ref 70–99)
Glucose-Capillary: 164 mg/dL — ABNORMAL HIGH (ref 70–99)
Glucose-Capillary: 137 mg/dL — ABNORMAL HIGH (ref 70–99)
Glucose-Capillary: 111 mg/dL — ABNORMAL HIGH (ref 70–99)
Glucose-Capillary: 206 mg/dL — ABNORMAL HIGH (ref 70–99)
Glucose-Capillary: 232 mg/dL — ABNORMAL HIGH (ref 70–99)

## 2019-03-06 LAB — CBC
HCT: 27.7 % — ABNORMAL LOW (ref 39.0–52.0)
Hemoglobin: 8.8 g/dL — ABNORMAL LOW (ref 13.0–17.0)
MCH: 27.1 pg (ref 26.0–34.0)
MCHC: 31.8 g/dL (ref 30.0–36.0)
MCV: 85.2 fL (ref 80.0–100.0)
Platelets: 247 10*3/uL (ref 150–400)
RBC: 3.25 MIL/uL — ABNORMAL LOW (ref 4.22–5.81)
RDW: 14.2 % (ref 11.5–15.5)
WBC: 9.8 10*3/uL (ref 4.0–10.5)
nRBC: 0 % (ref 0.0–0.2)

## 2019-03-06 SURGERY — INCISION AND DRAINAGE
Anesthesia: General | Site: Foot | Laterality: Right

## 2019-03-06 MED ORDER — KETAMINE HCL 50 MG/ML IJ SOLN
INTRAMUSCULAR | Status: DC | PRN
  Administered 2019-03-06: 50 mg via INTRAMUSCULAR

## 2019-03-06 MED ORDER — FENTANYL CITRATE (PF) 100 MCG/2ML IJ SOLN
INTRAMUSCULAR | Status: DC | PRN
  Administered 2019-03-06 (×2): 50 ug via INTRAVENOUS

## 2019-03-06 MED ORDER — HEMOSTATIC AGENTS (NO CHARGE) OPTIME
TOPICAL | Status: DC | PRN
  Administered 2019-03-06: 1 via TOPICAL

## 2019-03-06 MED ORDER — METHYLENE BLUE 0.5 % INJ SOLN
INTRAVENOUS | Status: AC
  Filled 2019-03-06: qty 10

## 2019-03-06 MED ORDER — KETAMINE HCL 50 MG/ML IJ SOLN
INTRAMUSCULAR | Status: AC
  Filled 2019-03-06: qty 10

## 2019-03-06 MED ORDER — LIDOCAINE-EPINEPHRINE 1 %-1:100000 IJ SOLN
INTRAMUSCULAR | Status: AC
  Filled 2019-03-06: qty 1

## 2019-03-06 MED ORDER — INSULIN PUMP
SUBCUTANEOUS | Status: DC
  Administered 2019-03-06 – 2019-03-07 (×2): via SUBCUTANEOUS
  Administered 2019-03-07: 5.3 via SUBCUTANEOUS
  Administered 2019-03-07: 6.2 via SUBCUTANEOUS
  Administered 2019-03-07: 17:00:00 10.4 via SUBCUTANEOUS
  Administered 2019-03-08: 09:00:00 11 via SUBCUTANEOUS
  Administered 2019-03-08: 13:00:00 8.6 via SUBCUTANEOUS
  Administered 2019-03-08: 18:00:00 4.2 via SUBCUTANEOUS
  Administered 2019-03-08: 21:00:00 5.2 via SUBCUTANEOUS
  Administered 2019-03-09: 18:00:00 via SUBCUTANEOUS
  Administered 2019-03-09: 8.6 via SUBCUTANEOUS
  Administered 2019-03-09: 12:00:00 via SUBCUTANEOUS

## 2019-03-06 MED ORDER — BUPIVACAINE HCL (PF) 0.25 % IJ SOLN
INTRAMUSCULAR | Status: AC
  Filled 2019-03-06: qty 30

## 2019-03-06 MED ORDER — THROMBIN 5000 UNITS EX SOLR
CUTANEOUS | Status: AC
  Filled 2019-03-06: qty 5000

## 2019-03-06 MED ORDER — MIDAZOLAM HCL 2 MG/2ML IJ SOLN
INTRAMUSCULAR | Status: DC | PRN
  Administered 2019-03-06: 2 mg via INTRAVENOUS

## 2019-03-06 MED ORDER — INSULIN GLARGINE 100 UNIT/ML ~~LOC~~ SOLN
55.0000 [IU] | Freq: Every day | SUBCUTANEOUS | Status: DC
  Filled 2019-03-06: qty 0.55

## 2019-03-06 MED ORDER — PHENYLEPHRINE HCL (PRESSORS) 10 MG/ML IV SOLN
INTRAVENOUS | Status: DC | PRN
  Administered 2019-03-06 (×3): 100 ug via INTRAVENOUS

## 2019-03-06 MED ORDER — INSULIN ASPART 100 UNIT/ML ~~LOC~~ SOLN: 8.0000 [IU] | Freq: Three times a day (TID) | SUBCUTANEOUS | Status: DC

## 2019-03-06 MED ORDER — HYDROMORPHONE HCL 1 MG/ML IJ SOLN: 0.2500 mg | INTRAMUSCULAR | Status: DC | PRN

## 2019-03-06 MED ORDER — LIDOCAINE HCL (PF) 1 % IJ SOLN
INTRAMUSCULAR | Status: AC
  Filled 2019-03-06: qty 30

## 2019-03-06 MED ORDER — PROPOFOL 10 MG/ML IV BOLUS
INTRAVENOUS | Status: DC | PRN
  Administered 2019-03-06: 200 mg via INTRAVENOUS

## 2019-03-06 MED ORDER — SODIUM CHLORIDE 0.9 % IV SOLN
INTRAVENOUS | Status: DC
  Administered 2019-03-06 – 2019-03-07 (×2): via INTRAVENOUS
  Administered 2019-03-08: 06:00:00 1000 mL via INTRAVENOUS
  Administered 2019-03-09: 06:00:00 via INTRAVENOUS

## 2019-03-06 MED ORDER — BUPIVACAINE HCL (PF) 0.5 % IJ SOLN
INTRAMUSCULAR | Status: AC
  Filled 2019-03-06: qty 30

## 2019-03-06 MED ORDER — THROMBIN 5000 UNITS EX SOLR
CUTANEOUS | Status: DC | PRN
  Administered 2019-03-06: 5000 [IU] via TOPICAL

## 2019-03-06 MED ORDER — BUPIVACAINE HCL 0.25 % IJ SOLN
INTRAMUSCULAR | Status: DC | PRN
  Administered 2019-03-06 (×2): 10 mL

## 2019-03-06 MED ORDER — INSULIN ASPART 100 UNIT/ML ~~LOC~~ SOLN: 0.0000 [IU] | Freq: Three times a day (TID) | SUBCUTANEOUS | Status: DC

## 2019-03-06 MED ORDER — LIDOCAINE HCL 1 % IJ SOLN
INTRAMUSCULAR | Status: DC | PRN
  Administered 2019-03-06: 10 mL

## 2019-03-06 SURGICAL SUPPLY — 77 items
AGENT HMST MTR 8 SURGIFLO (HEMOSTASIS) ×1
BANDAGE ACE 4X5 VEL STRL LF (GAUZE/BANDAGES/DRESSINGS) ×2 IMPLANT
BLADE OSC/SAGITTAL 5.5X25 (BLADE) ×1 IMPLANT
BLADE OSC/SAGITTAL MD 5.5X18 (BLADE) IMPLANT
BLADE OSCILLATING/SAGITTAL (BLADE)
BLADE SURG 15 STRL LF DISP TIS (BLADE) ×1 IMPLANT
BLADE SURG 15 STRL SS (BLADE) ×2
BLADE SW THK.38XMED LNG THN (BLADE) IMPLANT
BNDG COHESIVE 4X5 TAN STRL (GAUZE/BANDAGES/DRESSINGS) ×2 IMPLANT
BNDG COHESIVE 6X5 TAN STRL LF (GAUZE/BANDAGES/DRESSINGS) ×2 IMPLANT
BNDG CONFORM 2 STRL LF (GAUZE/BANDAGES/DRESSINGS) ×2 IMPLANT
BNDG CONFORM 3 STRL LF (GAUZE/BANDAGES/DRESSINGS) ×2 IMPLANT
BNDG ESMARK 4X12 TAN STRL LF (GAUZE/BANDAGES/DRESSINGS) ×2 IMPLANT
BNDG GAUZE 4.5X4.1 6PLY STRL (MISCELLANEOUS) ×2 IMPLANT
CANISTER SUCT 1200ML W/VALVE (MISCELLANEOUS) ×2 IMPLANT
CANISTER SUCT 3000ML PPV (MISCELLANEOUS) ×2 IMPLANT
CANISTER WOUND CARE 500ML ATS (WOUND CARE) ×2 IMPLANT
CNTNR SPEC 2.5X3XGRAD LEK (MISCELLANEOUS) ×1
CONT SPEC 4OZ STER OR WHT (MISCELLANEOUS) ×1
CONT SPEC 4OZ STRL OR WHT (MISCELLANEOUS) ×1
CONTAINER SPEC 2.5X3XGRAD LEK (MISCELLANEOUS) IMPLANT
COVER WAND RF STERILE (DRAPES) ×2 IMPLANT
CUFF TOURN SGL QUICK 12 (TOURNIQUET CUFF) IMPLANT
CUFF TOURN SGL QUICK 18X4 (TOURNIQUET CUFF) ×1 IMPLANT
DRAPE FLUOR MINI C-ARM 54X84 (DRAPES) IMPLANT
DRAPE XRAY CASSETTE 23X24 (DRAPES) IMPLANT
DRESSING ALLEVYN 4X4 (MISCELLANEOUS) IMPLANT
DURAPREP 26ML APPLICATOR (WOUND CARE) ×2 IMPLANT
ELECT REM PT RETURN 9FT ADLT (ELECTROSURGICAL) ×2
ELECTRODE REM PT RTRN 9FT ADLT (ELECTROSURGICAL) ×1 IMPLANT
GAUZE PACKING 1/4 X5 YD (GAUZE/BANDAGES/DRESSINGS) ×2 IMPLANT
GAUZE PACKING IODOFORM 1/2 (PACKING) ×2 IMPLANT
GAUZE PACKING IODOFORM 1X5 (MISCELLANEOUS) ×2 IMPLANT
GAUZE SPONGE 4X4 12PLY STRL (GAUZE/BANDAGES/DRESSINGS) ×2 IMPLANT
GAUZE XEROFORM 1X8 LF (GAUZE/BANDAGES/DRESSINGS) ×2 IMPLANT
GLOVE BIO SURGEON STRL SZ7.5 (GLOVE) ×2 IMPLANT
GLOVE INDICATOR 8.0 STRL GRN (GLOVE) ×2 IMPLANT
GOWN STRL REUS W/ TWL LRG LVL3 (GOWN DISPOSABLE) ×2 IMPLANT
GOWN STRL REUS W/TWL LRG LVL3 (GOWN DISPOSABLE) ×4
GOWN STRL REUS W/TWL MED LVL3 (GOWN DISPOSABLE) ×2 IMPLANT
HANDPIECE VERSAJET DEBRIDEMENT (MISCELLANEOUS) IMPLANT
IV NS 1000ML (IV SOLUTION) ×2
IV NS 1000ML BAXH (IV SOLUTION) ×1 IMPLANT
KIT DRSG VAC SLVR GRANUFM (MISCELLANEOUS) ×1 IMPLANT
KIT TURNOVER KIT A (KITS) ×2 IMPLANT
LABEL OR SOLS (LABEL) ×2 IMPLANT
NDL FILTER BLUNT 18X1 1/2 (NEEDLE) ×1 IMPLANT
NDL HYPO 25X1 1.5 SAFETY (NEEDLE) ×1 IMPLANT
NEEDLE FILTER BLUNT 18X 1/2SAF (NEEDLE) ×1
NEEDLE FILTER BLUNT 18X1 1/2 (NEEDLE) ×1 IMPLANT
NEEDLE HYPO 25X1 1.5 SAFETY (NEEDLE) ×2 IMPLANT
NS IRRIG 500ML POUR BTL (IV SOLUTION) ×2 IMPLANT
PACK EXTREMITY ARMC (MISCELLANEOUS) ×2 IMPLANT
PAD ABD DERMACEA PRESS 5X9 (GAUZE/BANDAGES/DRESSINGS) ×4 IMPLANT
PENCIL ELECTRO HAND CTR (MISCELLANEOUS) ×2 IMPLANT
PULSAVAC PLUS IRRIG FAN TIP (DISPOSABLE)
RASP SM TEAR CROSS CUT (RASP) IMPLANT
SHIELD FULL FACE ANTIFOG 7M (MISCELLANEOUS) ×2 IMPLANT
SOL .9 NS 3000ML IRR  AL (IV SOLUTION) ×1
SOL .9 NS 3000ML IRR AL (IV SOLUTION) ×1
SOL .9 NS 3000ML IRR UROMATIC (IV SOLUTION) ×1 IMPLANT
SPOGE SURGIFLO 8M (HEMOSTASIS) ×1
SPONGE SURGIFLO 8M (HEMOSTASIS) IMPLANT
STOCKINETTE IMPERV 14X48 (MISCELLANEOUS) ×2 IMPLANT
STOCKINETTE IMPERVIOUS 9X36 MD (GAUZE/BANDAGES/DRESSINGS) ×2 IMPLANT
SUT ETHILON 2 0 FS 18 (SUTURE) ×4 IMPLANT
SUT ETHILON 4-0 (SUTURE) ×2
SUT ETHILON 4-0 FS2 18XMFL BLK (SUTURE) ×1
SUT VIC AB 3-0 SH 27 (SUTURE)
SUT VIC AB 3-0 SH 27X BRD (SUTURE) ×1 IMPLANT
SUT VIC AB 4-0 FS2 27 (SUTURE) IMPLANT
SUTURE ETHLN 4-0 FS2 18XMF BLK (SUTURE) ×1 IMPLANT
SWAB CULTURE AMIES ANAERIB BLU (MISCELLANEOUS) IMPLANT
SYR 10ML LL (SYRINGE) ×2 IMPLANT
SYR 3ML LL SCALE MARK (SYRINGE) ×2 IMPLANT
TIP FAN IRRIG PULSAVAC PLUS (DISPOSABLE) ×1 IMPLANT
TRAY PREP VAG/GEN (MISCELLANEOUS) ×2 IMPLANT

## 2019-03-06 NOTE — Progress Notes (Signed)
Pt having surgery  Await culture from surgery to decide on IV antibiotic to go home with

## 2019-03-06 NOTE — Anesthesia Post-op Follow-up Note (Signed)
Anesthesia QCDR form completed.        

## 2019-03-06 NOTE — Progress Notes (Signed)
Inpatient Diabetes Program Recommendations  AACE/ADA: New Consensus Statement on Inpatient Glycemic Control  Target Ranges:  Prepandial:   less than 140 mg/dL      Peak postprandial:   less than 180 mg/dL (1-2 hours)      Critically ill patients:  140 - 180 mg/dL   Results for Richard Mendoza, Richard Mendoza (MRN [id number]) as of 03/06/2019 08:23  Ref. Range 03/05/2019 13:30 03/05/2019 16:54 03/05/2019 20:12 03/06/2019 01:07 03/06/2019 07:12  Glucose-Capillary Latest Ref Range: 70 - 99 mg/dL 383 (H) 338 (H) 329 (H) 357 (H) 268 (H)   Review of Glycemic Control history: DM2 Outpatient Diabetes medications: 630G Medtronic insulin pump with Novolog Current orders for Inpatient glycemic control: Insulin Pump ACHS & 2am  Inpatient Diabetes Program Recommendations:   Insulin-Pump: Due to noted hyperglycemia with insulin pump and no follow up with Endocrinology since 09/16/18, recommend to remove insulin pump while inpatient and and use SQ insulin to improve glycemic control. When patient is ready for discharge, he can resume his insulin pump and see Endocrinology as soon as possible to pump adjustments.  Insulin-SQ: If MD agreeable and insulin pump will be removed, recommend ordering Lantus 55 units Q24H, CBGs ACHS, Novolog 0-20 units ACHS, and Novolog 8 units TID with meals for meal coverage (once diet is resumed).  HgbA1C: A1C 10.3% on 01/16/19 (in Care Everywhere) indicating an average glucose of 249 mg/dl. Noted patient is followed by [facility] and seen M. Younts, ANP on 09/15/18.   NOTE: In reviewing chart, noted patient has DM2 hx and uses an insulin pump with Novolog for DM control. Last office visit with Endocrinology was on 09/15/18. Per office visit note on 09/15/18 by M. Younts, ANP the following should be patient's insulin pump settings:  Basal insulin  12A      2.1 units/hour Total daily basal insulin: 50.4 units/24 hours  Carb Coverage 1:6       1 unit for every 6 grams of  carbohydrates  Insulin Sensitivity 1:24     1 unit drops blood glucose 24 mg/dl  Target Glucose Goals 19T      100-110 mg/dl  Patient was recently inpatient 11/25/18 to 11/29/18 and Inpatient Diabetes Coordinator saw patient on 11/26/18 and confirmed the above insulin pump settings. Based on glucose trends and last A1C, patient needs insulin pump setting adjustments. It is not within the scope of practice for the Inpatient Diabetes Coordinator to make pump adjustments. Therefore, patient needs to follow up with Endocrinology for insulin pump setting adjustments.   Thanks, Orlando Penner, RN, MSN, CDE Diabetes Coordinator Inpatient Diabetes Program [phone] (Team Pager from 8am to 5pm)

## 2019-03-06 NOTE — Anesthesia Preprocedure Evaluation (Addendum)
Anesthesia Evaluation  Patient identified by MRN, date of birth, ID band Patient awake    Reviewed: Allergy & Precautions, H&P , NPO status , Patient's Chart, lab work & pertinent test results  Airway Mallampati: II  TM Distance: >3 FB     Dental  (+) Teeth Intact   Pulmonary sleep apnea , Current Smoker,           Cardiovascular hypertension, negative cardio ROS       Neuro/Psych negative neurological ROS  negative psych ROS   GI/Hepatic negative GI ROS, Neg liver ROS,   Endo/Other  negative endocrine ROSdiabetes  Renal/GU Renal disease     Musculoskeletal   Abdominal   Peds  Hematology negative hematology ROS (+)   Anesthesia Other Findings Past Medical History: 03/17/2009: DIABETES MELLITUS, TYPE II, UNCONTROLLED 12/08/2008: DM 03/17/2009: HYPERLIPIDEMIA 12/08/2008: HYPERTENSION 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  Past Surgical History: 09/09/2017: ANTERIOR CERVICAL DECOMP/DISCECTOMY FUSION; N/A     Comment:  Procedure: ANTERIOR CERVICAL DECOMPRESSION/DISCECTOMY               FUSION CERVICAL 6- CERVICAL 7;  Surgeon: Coletta Memos,               MD;  Location: [facility];  Service: Neurosurgery;                Laterality: N/A;  ANTERIOR CERVICAL               DECOMPRESSION/DISCECTOMY FUSION CERVICAL 6- CERVICAL 7 No date: APPENDECTOMY 10/03/2017: I&D EXTREMITY; Right     Comment:  Procedure: IRRIGATION AND DEBRIDEMENT RIGHT WRIST;                Surgeon: Betha Loa, MD;  Location: [facility];  Service:               Orthopedics;  Laterality: Right; 11/26/2018: I&D EXTREMITY; Right     Comment:  Procedure: IRRIGATION AND DEBRIDEMENT FASCIA ON RIGHT               FOOT;  Surgeon: Gwyneth Revels, DPM;  Location: [facility];              Service: Podiatry;  Laterality: Right; No date: osteomylitis No date: ROTATOR CUFF REPAIR; Left  BMI    Body Mass Index:  30.71 kg/m      Reproductive/Obstetrics negative OB ROS                            Anesthesia Physical Anesthesia Plan  ASA: III  Anesthesia Plan: General LMA   Post-op Pain Management:    Induction:   PONV Risk Score and Plan: Dexamethasone, Ondansetron, Midazolam and Treatment may vary due to age or medical condition  Airway Management Planned:   Additional Equipment:   Intra-op Plan:   Post-operative Plan:   Informed Consent: I have reviewed the patients History and Physical, chart, labs and discussed the procedure including the risks, benefits and alternatives for the proposed anesthesia with the patient or authorized representative who has indicated his/her understanding and acceptance.     Dental Advisory Given  Plan Discussed with: Anesthesiologist and CRNA  Anesthesia Plan Comments:         Anesthesia Quick Evaluation

## 2019-03-06 NOTE — Progress Notes (Signed)
[place] [facility] [place] NAME: Richard Mendoza    MR#:  [id number]  DATE OF BIRTH:  29-Mar-1976  SUBJECTIVE:   Patient going for debridement  And is controlled.  Reports no fevers  REVIEW OF SYSTEMS:    Review of Systems  Constitutional: Negative for fever, chills weight loss HENT: Negative for ear pain, nosebleeds, congestion, facial swelling, rhinorrhea, neck pain, neck stiffness and ear discharge.   Respiratory: Negative for cough, shortness of breath, wheezing  Cardiovascular: Negative for chest pain, palpitations and leg swelling.  Gastrointestinal: Negative for heartburn, abdominal pain, vomiting, diarrhea or consitpation Genitourinary: Negative for dysuria, urgency, frequency, hematuria Musculoskeletal: Negative for back pain or joint pain Neurological: Negative for dizziness, seizures, syncope, focal weakness,  numbness and headaches.  Hematological: Does not bruise/bleed easily.  Psychiatric/Behavioral: Negative for hallucinations, confusion, dysphoric mood SKIN: plantar wound drainage   Tolerating Diet: npo      DRUG ALLERGIES:  No Known Allergies  VITALS:  Blood pressure 115/65, pulse 79, temperature 99.7 F (37.6 C), temperature source Oral, resp. rate 18, height 5' 10"  (1.778 m), weight 97.1 kg, SpO2 96 %.  PHYSICAL EXAMINATION:  Constitutional: Appears well-developed and well-nourished. No distress. HENT: Normocephalic. Marland Kitchen Oropharynx is clear and moist.  Eyes: Conjunctivae and EOM are normal. PERRLA, no scleral icterus.  Neck: Normal ROM. Neck supple. No JVD. No tracheal deviation. CVS: RRR, S1/S2 +, no murmurs, no gallops, no carotid bruit.  Pulmonary: Effort and breath sounds normal, no stridor, rhonchi, wheezes, rales.  Abdominal: Soft. BS +,  no distension, tenderness, rebound or guarding.  Musculoskeletal: Normal range of motion. No edema and no tenderness.  Neuro: Alert. CN 2-12 grossly intact. No focal  deficits. Skin: right foot wrapped in ace bandage Psychiatric: Normal mood and affect.      LABORATORY PANEL:   CBC Recent Labs  Lab 03/06/19 0310  WBC 9.8  HGB 8.8*  HCT 27.7*  PLT 247   ------------------------------------------------------------------------------------------------------------------  Chemistries  Recent Labs  Lab 03/06/19 0310  NA 134*  K 3.7  CL 101  CO2 25  GLUCOSE 339*  BUN 21*  CREATININE 1.23  CALCIUM 8.5*   ------------------------------------------------------------------------------------------------------------------  Cardiac Enzymes No results for input(s): TROPONINI in the last 168 hours. ------------------------------------------------------------------------------------------------------------------  RADIOLOGY:  Mr Foot Right W Wo Contrast  Result Date: 03/06/2019 CLINICAL DATA:  Worsening diabetic foot ulcer. Prior debridement on 11/26/2018. EXAM: MRI OF THE RIGHT FOREFOOT WITHOUT AND WITH CONTRAST TECHNIQUE: Multiplanar, multisequence MR imaging of the right forefoot was performed before and after the administration of intravenous contrast. CONTRAST:  10 mL Gadavist intravenous contrast. COMPARISON:  MRI right foot dated November 25, 2018. FINDINGS: Bones/Joint/Cartilage New abnormal marrow edema and enhancement with decreased T1 marrow signal involving the fourth metatarsal and proximal phalanx. Marrow edema involves the majority of the fourth metatarsal, sparing the base, with patchy, early T1 marrow signal hypointensity. There is bony destruction of the base of the fourth proximal phalanx and fourth metatarsal head. Dorsal dislocation of the fourth MTP joint. No fracture. Large, complex fourth MTP joint effusion. Ligaments Eroded fourth MTP joint collateral ligaments. Remaining collateral ligaments are intact. Muscles and Tendons Flexor, peroneal and extensor compartment tendons are intact. Increased T2 signal within the intrinsic muscles  of the forefoot, nonspecific, but likely related to diabetic muscle changes. Soft tissue Ulceration at the plantar base of the fourth metatarsal head again with large 5.5 x 2.5 x 1.8 cm fluid collection extending from the ulceration proximally along  the fourth flexor tendons and plantar fascia, consistent with abscess. There is also fluid extending through the fourth intermetatarsal space to an additional 2.1 x 2.0 x 1.4 cm fluid collection in the lateral dorsal foot overlying the distal fifth metatarsal. There is extensive forefoot soft tissue swelling, skin thickening, and enhancement, consistent with cellulitis. There is a large area of devitalized, nonenhancing soft tissue involving the intrinsic muscles and superficial soft tissues surrounding the fourth metatarsal and proximal phalanx with multiple foci of soft tissue emphysema. IMPRESSION: 1. 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 Abnormal signal extends to the metatarsal proximal metaphysis. There is destruction of the proximal phalanx base and fourth metatarsal head with resultant dorsal dislocation of the MTP joint. 2. Progressive soft tissue infection in the lateral forefoot centered on the fourth metatarsal and proximal phalanx with multiple abscesses measuring up to 5.5 cm and large area of devitalized, gangrenous soft tissue. 3. Diffuse forefoot cellulitis. Electronically Signed   By: Titus Dubin M.D.   On: 03/06/2019 08:41     ASSESSMENT AND PLAN:   43 year old male with a history of diabetes with neuropathy who was directly admitted from podiatry office due to concern for osteomyelitis.   1.Osteomyelitis of the fourth proximal phalanx and metatarsal proximal metaphysis with history of recent MRSA infection of the same foot: Plan for surgical debridement today.  Patient aware that he may need trans-met amputation. Continue cefepime and vancomycin ID and podiatry consultation appreciated  2.  Diabetes with  neuropathy: Diabetes nurse recommends no insulin pump and start sliding scale with Lantus.  3.  DQQ:[id number] lisinopril  4.  Depression: Continue BuSpar and Zoloft  5.  Hyperlipidemia: Continue statin   Management plans discussed with the patient and he is in agreement.  CODE STATUS: full  TOTAL TIME TAKING CARE OF THIS PATIENT: 30 minutes.     POSSIBLE D/C 2 days, DEPENDING ON CLINICAL CONDITION.   Bettey Costa M.D on 03/06/2019 at 9:52 AM  Between 7am to 6pm - Pager - [phone] After 6pm go to [email or web address] - password [email or web address] [facility]  Office  [phone]  CC: Primary care physician; Tracie Harrier, MD  Note: This dictation was prepared with Dragon dictation along with smaller phrase technology. Any transcriptional errors that result from this process are unintentional.

## 2019-03-06 NOTE — Anesthesia Procedure Notes (Signed)
Procedure Name: LMA Insertion Date/Time: 03/06/2019 2:02 PM Performed by: Omer Jack, CRNA Pre-anesthesia Checklist: Patient identified, Patient being monitored, Timeout performed, Emergency Drugs available and Suction available Patient Re-evaluated:Patient Re-evaluated prior to induction Oxygen Delivery Method: Circle system utilized Preoxygenation: Pre-oxygenation with 100% oxygen Induction Type: IV induction Ventilation: Mask ventilation without difficulty LMA: LMA inserted LMA Size: 4.5 Tube type: Oral Number of attempts: 1 Placement Confirmation: positive ETCO2 and breath sounds checked- equal and bilateral Tube secured with: Tape Dental Injury: Teeth and Oropharynx as per pre-operative assessment

## 2019-03-06 NOTE — Progress Notes (Signed)
Inpatient Diabetes Program Recommendations  AACE/ADA: New Consensus Statement on Inpatient Glycemic Control   Target Ranges:  Prepandial:   less than 140 mg/dL      Peak postprandial:   less than 180 mg/dL (1-2 hours)      Critically ill patients:  140 - 180 mg/dL   Results for ATHANASIUS, TADESSE (MRN [id number]) as of 03/06/2019 12:55  Ref. Range 03/05/2019 13:30 03/05/2019 16:54 03/05/2019 20:12 03/06/2019 01:07 03/06/2019 07:12 03/06/2019 11:49  Glucose-Capillary Latest Ref Range: 70 - 99 mg/dL 001 (H) 749 (H) 449 (H) 357 (H) 268 (H) 164 (H)   Review of Glycemic Control  Diabetes history: DM2 Outpatient Diabetes medications: 630G Medtronic insulin pump with Novolog Current orders for Inpatient glycemic control:Insulin Pump ACHS & 2am  Inpatient Diabetes Program Recommendations:  Insulin Pump: Patient refuses to use SQ insulin injections and wants to keep his insulin pump on. Recommend changing frequency of CBGs and Insulin pump to Q4H for more frequent glucose monitoring and correction.  NOTE: Spoke with patient over the phone regarding DM control. Patient states that he wants to keep his insulin pump on for DM control. Stressed importance of glycemic control especially given foot infection. Discussed how hyperglycemia contributes to complicatons and delays wound healing. Patient states that his glucose has been more elevated over the past 4 days due to infection. Patient reports that no changes have been made to his insulin pump settings since they were reviewed by me during last hospitalization on 11/26/18. Inquired about any upcoming appointments with Endocrinology and patient states he is not sure when his next appointment is. Encouraged patient to see if he has an upcoming appointment scheduled and if not encouraged him to make an appointment for follow up as he will likely need insulin pump setting adjustments. Patient states it may be difficult to get an appointment due to restrictions with  COVID. Encouraged patient to ask about a video visit or if he could give them his glucose readings and they could advise him on insulin pump settings that need to be made. Informed patient that it would be requested that CBGs and insulin pump correction be done Q4H to help improve glycemic control. Reminded patient that once his diet is resumed, to be sure he is counting and covering carbohydrates correctly to ensure that carbohydrate intake does not contribute to hyperglycemia further. Patient verbalized understanding of information discussed and states that he has no questions at this time. Sent chat message to Dr. Juliene Pina to ask if insulin pump and CBG frequency be changed to Q4H and permission given to modify frequency of both. Also informed [name], RN of change to Q4H.    Thanks, Orlando Penner, RN, MSN, CDE Diabetes Coordinator Inpatient Diabetes Program [phone] (Team Pager from 8am to 5pm)

## 2019-03-06 NOTE — Op Note (Signed)
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  Amputation right fourth ray 2.  I&D plantar right foot    EBL: Minimal    Anesthesia:local and general    Hemostasis: Ankle tourniquet inflated to 200 mmHg for approximately 15 minutes    Specimen: Bone and toe for pathology and bone for culture    Complications: None    Operative indications:Richard Mendoza is an 43 y.o. that presents today for surgical intervention.  The risks/benefits/alternatives/complications have been discussed and consent has been given.    Procedure:  Patient was brought into the OR and placed on the operating table in thesupine position. After anesthesia was obtained theright lower extremity was prepped and draped in usual sterile fashion.  Attention was directed to the right foot where there was noted to be purulent drainage from the dorsal aspect of the fourth MTPJ.  Preoperatively MRI showed osteomyelitis of the fourth toe and joint with dislocation of the joint.  A dorsal incision was made course on the fourth metatarsal circumferential around the fourth toe.  Full-thickness incision was taken down to bone.  Incision was placed to about the level of the midshaft of the fourth metatarsal.  Osteotomy was created in the distal half of the fourth ray was then removed.  There was noted to be areas of purulent drainage both medial and lateral to the area and these were I indeed and drained appropriately.  There was a plantar tracking wound.  There was a soft boggy area on the plantar aspect of the foot.  A small stab incision was made on the plantar midfoot as a secondary incision for further drainage and I&D.  This time the wound was flushed with copious amounts of irrigation with a bulb syringe.  No further purulent drainage was noted.  Bleeders were Bovie cauterized and the area was  infiltrated with Surgi-Flo with thrombin.  Skin was then closed with a 3-0 nylon.  The distal aspect with packed open with iodoform gauze and the plantar ulcerative site was also packed open as well as the small puncture site.  A large bulky sterile dressing was applied.  Patient will be readmitted to the floor for continued IV antibiotics.    Patient tolerated the procedure and anesthesia well.  Was transported from the OR to the PACU with all vital signs stable and vascular status intact. To be discharged per routine protocol.  Will follow up in approximately 1 week in the outpatient clinic.

## 2019-03-06 NOTE — Transfer of Care (Signed)
Immediate Anesthesia Transfer of Care Note  Patient: Richard Mendoza  Procedure(s) Performed: INCISION AND DRAINAGE RIGHT FOOT, WITH 4th RAY AMPUTATION (Right Foot)  Patient Location: PACU  Anesthesia Type:General  Level of Consciousness: drowsy and patient cooperative  Airway & Oxygen Therapy: Patient Spontanous Breathing and Patient connected to face mask oxygen  Post-op Assessment: Report given to RN and Post -op Vital signs reviewed and stable  Post vital signs: Reviewed and stable  Last Vitals:  Vitals Value Taken Time  BP 98/60 03/06/2019  3:06 PM  Temp 36.2 C 03/06/2019  3:05 PM  Pulse 66 03/06/2019  3:06 PM  Resp 16 03/06/2019  3:06 PM  SpO2 100 % 03/06/2019  3:06 PM  Vitals shown include unvalidated device data.  Last Pain:  Vitals:   03/06/19 1505  TempSrc:   PainSc: (P) Asleep      Patients Stated Pain Goal: 1 (03/06/19 0725)  Complications: No apparent anesthesia complications

## 2019-03-06 NOTE — Progress Notes (Signed)
Pt refused insulin shots and wants to continue using his insulin pump. MD Mody made aware.

## 2019-03-06 NOTE — Anesthesia Postprocedure Evaluation (Signed)
Anesthesia Post Note  Patient: Richard Mendoza  Procedure(s) Performed: INCISION AND DRAINAGE RIGHT FOOT, WITH 4th RAY AMPUTATION (Right Foot)  Patient location during evaluation: PACU Anesthesia Type: General Level of consciousness: awake and alert Pain management: pain level controlled Vital Signs Assessment: post-procedure vital signs reviewed and stable Respiratory status: spontaneous breathing, nonlabored ventilation and respiratory function stable Cardiovascular status: blood pressure returned to baseline and stable Postop Assessment: no apparent nausea or vomiting Anesthetic complications: no     Last Vitals:  Vitals:   03/06/19 1518 03/06/19 1519  BP:  115/74  Pulse: 66 72  Resp: 15 12  Temp:    SpO2: 100% 100%    Last Pain:  Vitals:   03/06/19 1519  TempSrc:   PainSc: Asleep                 Jovita Gamma

## 2019-03-06 NOTE — Consult Note (Signed)
Pharmacy Antibiotic Note  Richard Mendoza is a 43 y.o. male admitted on 03/05/2019 with Osteomyelitis of the right foot.  Pharmacy has been consulted for cefepime and vancomycin dosing.  He recently had a PICC line placed and received IV vancomycin and cefepime for 2 weeks. His dose of vancomycin was 1500 mg every 12 hours: levels were therapeutic.   Plan:  1) Vancomycin 1250 mg IV Q 24 hrs after 2000 mg loading dose Goal AUC 400-550, BMI 30.7, T1/2 12.7h Expected AUC: 471.3 SCr used: 1.64 Scr trending down Scr 1.23. Ordered a 24 hour level to assess regimen. May need to increase the frequency if patient is clearing.   2) cefepime 2 grams IV every 8 hours  Height: 5\' 10"  (177.8 cm) Weight: 214 lb (97.1 kg) IBW/kg (Calculated) : 73  Antimicrobials this admission: 4/16 cefepime >>  4/16 vancomycin >>   Microbiology results: 4/16 BCx pending  Thank you for allowing pharmacy to be a part of this patient's care.  Ronnald Ramp, PharmD, BCPS 03/06/2019 1:31 PM

## 2019-03-07 LAB — GLUCOSE, CAPILLARY
Glucose-Capillary: 237 mg/dL — ABNORMAL HIGH (ref 70–99)
Glucose-Capillary: 256 mg/dL — ABNORMAL HIGH (ref 70–99)
Glucose-Capillary: 211 mg/dL — ABNORMAL HIGH (ref 70–99)
Glucose-Capillary: 187 mg/dL — ABNORMAL HIGH (ref 70–99)

## 2019-03-07 LAB — BASIC METABOLIC PANEL
Anion gap: 9 (ref 5–15)
BUN: 23 mg/dL — ABNORMAL HIGH (ref 6–20)
CO2: 23 mmol/L (ref 22–32)
Calcium: 8.6 mg/dL — ABNORMAL LOW (ref 8.9–10.3)
Chloride: 103 mmol/L (ref 98–111)
Creatinine, Ser: 1.16 mg/dL (ref 0.61–1.24)
GFR calc Af Amer: >60 mL/min (ref >60)
GFR calc non Af Amer: >60 mL/min (ref >60)
Glucose, Bld: 257 mg/dL — ABNORMAL HIGH (ref 70–99)
Potassium: 4.2 mmol/L (ref 3.5–5.1)
Sodium: 135 mmol/L (ref 135–145)

## 2019-03-07 LAB — CBC
HCT: 29.5 % — ABNORMAL LOW (ref 39.0–52.0)
Hemoglobin: 9.4 g/dL — ABNORMAL LOW (ref 13.0–17.0)
MCH: 27 pg (ref 26.0–34.0)
MCHC: 31.9 g/dL (ref 30.0–36.0)
MCV: 84.8 fL (ref 80.0–100.0)
Platelets: 270 10*3/uL (ref 150–400)
RBC: 3.48 MIL/uL — ABNORMAL LOW (ref 4.22–5.81)
RDW: 14.3 % (ref 11.5–15.5)
WBC: 11.9 10*3/uL — ABNORMAL HIGH (ref 4.0–10.5)
nRBC: 0 % (ref 0.0–0.2)

## 2019-03-07 LAB — VANCOMYCIN, RANDOM: Vancomycin Rm: 6

## 2019-03-07 MED ORDER — VANCOMYCIN HCL 10 G IV SOLR
1500.0000 mg | INTRAVENOUS | Status: DC
  Administered 2019-03-08 (×2): 1500 mg via INTRAVENOUS
  Filled 2019-03-07 (×4): qty 1500

## 2019-03-07 MED ORDER — VANCOMYCIN HCL 10 G IV SOLR
1750.0000 mg | INTRAVENOUS | Status: DC
  Filled 2019-03-07: qty 1750

## 2019-03-07 NOTE — Consult Note (Signed)
Pharmacy Antibiotic Note  Richard Mendoza is a 43 y.o. male admitted on 03/05/2019 with Osteomyelitis of the right foot.  Pharmacy has been consulted for cefepime and vancomycin dosing.  He recently had a PICC line placed and received IV vancomycin and cefepime for 2 weeks. His dose of vancomycin was 1500 mg every 12 hours: levels were therapeutic.   Plan:  1) Vanc Random level 6. Scr and CrCl improved. Will change vancomycin dose to Vancomycin 1500 mg IV Q 18  hrs. Goal AUC 400-550. Expected AUC: 545 SCr used: 1.16  2) Continue cefepime 2 grams IV every 8 hours  Height: 5\' 10"  (177.8 cm) Weight: 214 lb (97.1 kg) IBW/kg (Calculated) : 73  Antimicrobials this admission: 4/16 cefepime >>  4/16 vancomycin >>   Microbiology results: 4/16 BCx pending  Thank you for allowing pharmacy to be a part of this patient's care.  Gardner Candle, PharmD, BCPS 03/07/2019 5:59 AM

## 2019-03-07 NOTE — Progress Notes (Signed)
[facility]                                                      Patient Demographics  Richard Mendoza, is a 43 y.o. male   MRN: [id number]   DOB - May 26, 1976  Admit Date - 03/05/2019    Outpatient Primary MD for the patient is Barbette Reichmann, MD  Consult requested in the Hospital by Adrian Saran, MD, On 03/07/2019   With History of -  Past Medical History:  Diagnosis Date  . DIABETES MELLITUS, TYPE II, UNCONTROLLED 03/17/2009  . DM 12/08/2008  . HYPERLIPIDEMIA 03/17/2009  . HYPERTENSION 12/08/2008  . 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 ANTERIOR CERVICAL DECOMP/DISCECTOMY FUSION N/A 09/09/2017   Procedure: ANTERIOR CERVICAL DECOMPRESSION/DISCECTOMY FUSION CERVICAL 6- CERVICAL 7;  Surgeon: Coletta Memos, MD;  Location: [facility] OR;  Service: Neurosurgery;  Laterality: N/A;  ANTERIOR CERVICAL DECOMPRESSION/DISCECTOMY FUSION CERVICAL 6- CERVICAL 7  . APPENDECTOMY    . I&D EXTREMITY Right 10/03/2017   Procedure: IRRIGATION AND DEBRIDEMENT RIGHT WRIST;  Surgeon: Betha Loa, MD;  Location: [facility] OR;  Service: Orthopedics;  Laterality: Right;  . I&D EXTREMITY Right 11/26/2018   Procedure: IRRIGATION AND DEBRIDEMENT FASCIA ON RIGHT FOOT;  Surgeon: Gwyneth Revels, DPM;  Location: [facility] ORS;  Service: Podiatry;  Laterality: Right;  . osteomylitis    . ROTATOR CUFF REPAIR Left     in for   No chief complaint on file.    HPI  Richard Mendoza  is a 43 y.o. male, 1 day status post fourth ray amputation and incision and drainage to soft tissue abscesses in the right foot.  This done by Dr. Ether Griffins yesterday    Social History Social History   Tobacco Use  . Smoking status: Current Every Day Smoker    Packs/day: 1.00    Years: 17.00    Pack years: 17.00    Types: Cigarettes  . Smokeless tobacco: Never Used  .  Tobacco comment: currently smoking 1 ppd.    Substance Use Topics  . Alcohol use: Yes    Frequency: Never    Comment: rare    Family History Family History  Problem Relation Age of Onset  . Diabetes Mother   . Heart disease Father   . Diabetes Father   . Arthritis Other   . Hyperlipidemia Other   . Hypertension Other   . Cancer Other        breast    Prior to Admission medications   Medication Sig Start Date End Date Taking? Authorizing Provider  acetaminophen (TYLENOL) 325 MG tablet Take 2 tablets (650 mg total) by mouth every 6 (six) hours as needed for mild pain (or Fever >/= 101). 11/29/18   Gouru, Deanna Artis, MD  aMILoride (MIDAMOR) 5 MG tablet Take 1 tablet (5 mg total) by mouth daily. 11/30/18   Gouru, Deanna Artis, MD  atorvastatin (LIPITOR) 20 MG tablet Take 20 mg by mouth daily. 08/23/17   [provider]  busPIRone (BUSPAR) 15 MG tablet Take 15 mg by mouth 2 (two) times daily.    [provider]  [employer] SENNA 8.6 MG tablet Take 1 tablet by mouth 2 (two) times daily.  10/31/17   [provider]  dimenhyDRINATE (DRAMAMINE) 50 MG tablet Take 50 mg  by mouth 2 (two) times daily as needed for itching or dizziness.     [provider]  doxycycline (VIBRA-TABS) 100 MG tablet Take 100 mg by mouth 2 (two) times daily. 03/03/19 03/13/19  [provider]  famotidine (PEPCID) 20 MG tablet Take 20 mg by mouth 2 (two) times daily.    [provider]  gabapentin (NEURONTIN) 300 MG capsule Take 600 mg by mouth at bedtime.     [provider]  HYDROcodone-acetaminophen (NORCO/VICODIN) 5-325 MG tablet Take 1-2 tablets by mouth every 6 (six) hours as needed for moderate pain. 11/29/18   Ramonita LabGouru, Aruna, MD  ibuprofen (ADVIL,MOTRIN) 200 MG tablet Take 2-3 tablets (400-600 mg total) by mouth every 6 (six) hours as needed for moderate pain. 11/29/18   Gouru, Deanna ArtisAruna, MD  insulin aspart (NOVOLOG) 100 UNIT/ML injection Inject into the skin 3 (three) times daily  before meals. Via pump    [provider]  Insulin Human (INSULIN PUMP) SOLN Inject 2.5 each into the skin every hour.    [provider]  linaclotide (LINZESS) 72 MCG capsule Take 72 mcg by mouth daily as needed (constipation).    [provider]  lisinopril (PRINIVIL,ZESTRIL) 5 MG tablet Take 5 mg by mouth daily. 10/14/17   [provider]  metoCLOPramide (REGLAN) 10 MG tablet Take 1 tablet (10 mg total) by mouth every 6 (six) hours. Patient taking differently: Take 10 mg by mouth 2 (two) times daily.  11/17/17   Elson AreasSofia, Leslie K, PA-C  ondansetron (ZOFRAN) 8 MG tablet Take 8 mg by mouth every 6 (six) hours as needed for nausea/vomiting.    [provider]  pantoprazole (PROTONIX) 40 MG tablet Take 40 mg by mouth daily. 08/23/17   [provider]  sertraline (ZOLOFT) 100 MG tablet Take 100 mg by mouth daily.  10/31/17   [provider]  sildenafil (REVATIO) 20 MG tablet 1-5 pills as needed for ED symptoms Patient taking differently: Take 20-100 mg by mouth daily as needed (ED).  09/26/15   Romero BellingEllison, Sean, MD  tiZANidine (ZANAFLEX) 4 MG tablet Take 1 tablet (4 mg total) by mouth every 6 (six) hours as needed for muscle spasms. 09/10/17   Coletta Memosabbell, Kyle, MD  vitamin B-12 (CYANOCOBALAMIN) 1000 MCG tablet Take 1,000 mcg by mouth daily. 01/27/19   [provider]    Anti-infectives (From admission, onward)   Start     Dose/Rate Route Frequency Ordered Stop   03/07/19 0600  vancomycin (VANCOCIN) 1,750 mg in sodium chloride 0.9 % 500 mL IVPB  Status:  Discontinued     1,750 mg 250 mL/hr over 120 Minutes Intravenous Every 24 hours 03/07/19 0558 03/07/19 0559   03/07/19 0600  vancomycin (VANCOCIN) 1,500 mg in sodium chloride 0.9 % 500 mL IVPB     1,500 mg 250 mL/hr over 120 Minutes Intravenous Every 18 hours 03/07/19 0559     03/06/19 0600  vancomycin (VANCOCIN) 1,250 mg in sodium chloride 0.9 % 250 mL IVPB  Status:  Discontinued      1,250 mg 166.7 mL/hr over 90 Minutes Intravenous Every 24 hours 03/05/19 1519 03/07/19 0558   03/05/19 2200  ceFEPIme (MAXIPIME) 2 g in sodium chloride 0.9 % 100 mL IVPB  Status:  Discontinued     2 g 200 mL/hr over 30 Minutes Intravenous Every 8 hours 03/05/19 1519 03/05/19 1529   03/05/19 2200  ceFEPIme (MAXIPIME) 2 g in sodium chloride 0.9 % 100 mL IVPB     2 g  200 mL/hr over 30 Minutes Intravenous Every 8 hours 03/05/19 1519     03/05/19 1517  vancomycin variable dose per unstable renal function (pharmacist dosing)      Does not apply See admin instructions 03/05/19 1517     03/05/19 1500  vancomycin (VANCOCIN) 2,000 mg in sodium chloride 0.9 % 500 mL IVPB     2,000 mg 250 mL/hr over 120 Minutes Intravenous  Once 03/05/19 1410 03/05/19 1855   03/05/19 1430  ceFEPIme (MAXIPIME) 2 g in sodium chloride 0.9 % 100 mL IVPB     2 g 200 mL/hr over 30 Minutes Intravenous  Once 03/05/19 1410 03/05/19 1600      Scheduled Meds: . aMILoride  5 mg Oral Daily  . atorvastatin  40 mg Oral q1800  . busPIRone  10 mg Oral BID  . Chlorhexidine Gluconate Cloth  6 each Topical Q0600  . famotidine  20 mg Oral BID  . gabapentin  600 mg Oral QHS  . insulin pump   Subcutaneous Q4H  . lisinopril  5 mg Oral Daily  . mupirocin ointment  1 application Nasal BID  . nicotine  21 mg Transdermal Daily  . pantoprazole  40 mg Oral Daily  . sertraline  100 mg Oral Daily  . vancomycin variable dose per unstable renal function (pharmacist dosing)   Does not apply See admin instructions   Continuous Infusions: . sodium chloride    . ceFEPime (MAXIPIME) IV 2 g (03/07/19 0618)  . vancomycin     PRN Meds:.acetaminophen **OR** acetaminophen, linaclotide, ondansetron **OR** ondansetron (ZOFRAN) IV, oxyCODONE, polyethylene glycol, tiZANidine  No Known Allergies  Physical Exam  Vitals  Blood pressure 138/88, pulse 77, temperature 97.8 F (36.6 C), temperature source Oral, resp. rate 17, height  (1.778 m),  weight 97.1 kg, SpO2 96 %.  Lower Extremity exam: Dressing removed today and packing changed.  Still some cellulitis around the dorsum of the foot but likely improved from prior to surgery.  Incision is closed on the dorsum of the foot approximately 80%.  Distal area is open and has packing with iodoform gauze.  This was removed today no overt purulence was noted.  A little compression around both the dorsal wound and the plantar wounds which were also dressed and packing was removed showed no evidence of of pus drainage with compression.  Plantar foot does not appear to be cellulitic.  Small ulcer is noted on the fifth metatarsal head but does not appear to be related to the fourth toe issues.  Data Review  CBC Recent Labs  Lab 03/05/19 1422 03/06/19 0310 03/07/19 0502  WBC 11.2* 9.8 11.9*  HGB 9.5* 8.8* 9.4*  HCT 30.2* 27.7* 29.5*  PLT 244 247 270  MCV 87.5 85.2 84.8  MCH 27.5 27.1 27.0  MCHC 31.5 31.8 31.9  RDW 14.1 14.2 14.3   ------------------------------------------------------------------------------------------------------------------  Chemistries  Recent Labs  Lab 03/05/19 1422 03/06/19 0310 03/07/19 0502  NA 131* 134* 135  K 3.6 3.7 4.2  CL 97* 101 103  CO2 GLUCOSE 334* 339* 257*  BUN 24* 21* 23*  CREATININE 1.64* 1.23 1.16  CALCIUM 8.5* 8.5* 8.6*   ----------------------------------------------------------------------------------- Imaging results:   Mr Foot Right W Wo Contrast  Result Date: 03/06/2019 CLINICAL DATA:  Worsening diabetic foot ulcer. Prior debridement on 11/26/2018. EXAM: MRI OF THE RIGHT FOREFOOT WITHOUT AND WITH CONTRAST TECHNIQUE: Multiplanar, multisequence MR imaging of the right forefoot was performed before and after the administration of  intravenous contrast. CONTRAST:  10 mL Gadavist intravenous contrast. COMPARISON:  MRI right foot dated November 25, 2018. FINDINGS: Bones/Joint/Cartilage New abnormal marrow edema and enhancement  with decreased T1 marrow signal involving the fourth metatarsal and proximal phalanx. Marrow edema involves the majority of the fourth metatarsal, sparing the base, with patchy, early T1 marrow signal hypointensity. There is bony destruction of the base of the fourth proximal phalanx and fourth metatarsal head. Dorsal dislocation of the fourth MTP joint. No fracture. Large, complex fourth MTP joint effusion. Ligaments Eroded fourth MTP joint collateral ligaments. Remaining collateral ligaments are intact. Muscles and Tendons Flexor, peroneal and extensor compartment tendons are intact. Increased T2 signal within the intrinsic muscles of the forefoot, nonspecific, but likely related to diabetic muscle changes. Soft tissue Ulceration at the plantar base of the fourth metatarsal head again with large 5.5 x 2.5 x 1.8 cm fluid collection extending from the ulceration proximally along the fourth flexor tendons and plantar fascia, consistent with abscess. There is also fluid extending through the fourth intermetatarsal space to an additional 2.1 x 2.0 x 1.4 cm fluid collection in the lateral dorsal foot overlying the distal fifth metatarsal. There is extensive forefoot soft tissue swelling, skin thickening, and enhancement, consistent with cellulitis. There is a large area of devitalized, nonenhancing soft tissue involving the intrinsic muscles and superficial soft tissues surrounding the fourth metatarsal and proximal phalanx with multiple foci of soft tissue emphysema. IMPRESSION: 1. 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 Abnormal signal extends to the metatarsal proximal metaphysis. There is destruction of the proximal phalanx base and fourth metatarsal head with resultant dorsal dislocation of the MTP joint. 2. Progressive soft tissue infection in the lateral forefoot centered on the fourth metatarsal and proximal phalanx with multiple abscesses measuring up to 5.5 cm and large area of  devitalized, gangrenous soft tissue. 3. Diffuse forefoot cellulitis. Electronically Signed   By: Obie Dredge M.D.   On: 03/06/2019 08:41    Assessment & Plan: Overall the foot appears relatively stable but still with some cellulitis to the foot.  Packing was changed today.  I will keep him with bed rest today tomorrow I will let him start to get up for bathroom privileges wearing the postop shoe.  We will get a physical therapy consult to achieve that tomorrow.  Continue intravenous antibiotics.  Gram stain shows a mixed infection of abundant gram-positive cocci and abundant gram-negative rods.  Infectious disease has seen the patient.  I spoke with Dr. Tildon Husky and she is going to order a PICC line placement since he will need IV antibiotics for 4 to 6 weeks.  Active Problems:   Diabetic foot infection [facility])   Family Communication: Plan discussed with patient   Recardo Evangelist M.D on 03/07/2019 at 8:26 AM  Thank you for the consult, we will follow the patient with you in the Hospital.

## 2019-03-07 NOTE — Progress Notes (Signed)
[employer] - [place] at [facility]   PATIENT NAME: Richard Mendoza    MR#:  [id number]  DATE OF BIRTH:  24-Oct-1976  SUBJECTIVE:   Patient doing well Dr troxler at bedside just changed dressing  REVIEW OF SYSTEMS:    Review of Systems  Constitutional: Negative for fever, chills weight loss HENT: Negative for ear pain, nosebleeds, congestion, facial swelling, rhinorrhea, neck pain, neck stiffness and ear discharge.   Respiratory: Negative for cough, shortness of breath, wheezing  Cardiovascular: Negative for chest pain, palpitations and leg swelling.  Gastrointestinal: Negative for heartburn, abdominal pain, vomiting, diarrhea or consitpation Genitourinary: Negative for dysuria, urgency, frequency, hematuria Musculoskeletal: Negative for back pain or joint pain Neurological: Negative for dizziness, seizures, syncope, focal weakness,  numbness and headaches.  Hematological: Does not bruise/bleed easily.  Psychiatric/Behavioral: Negative for hallucinations, confusion, dysphoric mood    Tolerating Diet:  yes      DRUG ALLERGIES:  No Known Allergies  VITALS:  Blood pressure 138/88, pulse 77, temperature 97.8 F (36.6 C), temperature source Oral, resp. rate 17, height 5\' 10"  (1.778 m), weight 97.1 kg, SpO2 96 %.  PHYSICAL EXAMINATION:  Constitutional: Appears well-developed and well-nourished. No distress. HENT: Normocephalic. Marland Kitchen Oropharynx is clear and moist.  Eyes: Conjunctivae and EOM are normal. PERRLA, no scleral icterus.  Neck: Normal ROM. Neck supple. No JVD. No tracheal deviation. CVS: RRR, S1/S2 +, no murmurs, no gallops, no carotid bruit.  Pulmonary: Effort and breath sounds normal, no stridor, rhonchi, wheezes, rales.  Abdominal: Soft. BS +,  no distension, tenderness, rebound or guarding.  Musculoskeletal: Normal range of motion. No edema and no tenderness.  Neuro: Alert. CN 2-12 grossly intact. No focal deficits. Skin: cellulitis around dorsum  of foot Packing in tact no purulence Small ulcer 5th MT 4th toe ray amputation Psychiatric: Normal mood and affect.      LABORATORY PANEL:   CBC Recent Labs  Lab 03/07/19 0502  WBC 11.9*  HGB 9.4*  HCT 29.5*  PLT 270   ------------------------------------------------------------------------------------------------------------------  Chemistries  Recent Labs  Lab 03/07/19 0502  NA 135  K 4.2  CL 103  CO2 23  GLUCOSE 257*  BUN 23*  CREATININE 1.16  CALCIUM 8.6*   ------------------------------------------------------------------------------------------------------------------  Cardiac Enzymes No results for input(s): TROPONINI in the last 168 hours. ------------------------------------------------------------------------------------------------------------------  RADIOLOGY:  Mr Foot Right W Wo Contrast  Result Date: 03/06/2019 CLINICAL DATA:  Worsening diabetic foot ulcer. Prior debridement on 11/26/2018. EXAM: MRI OF THE RIGHT FOREFOOT WITHOUT AND WITH CONTRAST TECHNIQUE: Multiplanar, multisequence MR imaging of the right forefoot was performed before and after the administration of intravenous contrast. CONTRAST:  10 mL Gadavist intravenous contrast. COMPARISON:  MRI right foot dated November 25, 2018. FINDINGS: Bones/Joint/Cartilage New abnormal marrow edema and enhancement with decreased T1 marrow signal involving the fourth metatarsal and proximal phalanx. Marrow edema involves the majority of the fourth metatarsal, sparing the base, with patchy, early T1 marrow signal hypointensity. There is bony destruction of the base of the fourth proximal phalanx and fourth metatarsal head. Dorsal dislocation of the fourth MTP joint. No fracture. Large, complex fourth MTP joint effusion. Ligaments Eroded fourth MTP joint collateral ligaments. Remaining collateral ligaments are intact. Muscles and Tendons Flexor, peroneal and extensor compartment tendons are intact. Increased T2  signal within the intrinsic muscles of the forefoot, nonspecific, but likely related to diabetic muscle changes. Soft tissue Ulceration at the plantar base of the fourth metatarsal head again with large 5.5 x 2.5 x 1.8 cm  fluid collection extending from the ulceration proximally along the fourth flexor tendons and plantar fascia, consistent with abscess. There is also fluid extending through the fourth intermetatarsal space to an additional 2.1 x 2.0 x 1.4 cm fluid collection in the lateral dorsal foot overlying the distal fifth metatarsal. There is extensive forefoot soft tissue swelling, skin thickening, and enhancement, consistent with cellulitis. There is a large area of devitalized, nonenhancing soft tissue involving the intrinsic muscles and superficial soft tissues surrounding the fourth metatarsal and proximal phalanx with multiple foci of soft tissue emphysema. IMPRESSION: 1. 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 Abnormal signal extends to the metatarsal proximal metaphysis. There is destruction of the proximal phalanx base and fourth metatarsal head with resultant dorsal dislocation of the MTP joint. 2. Progressive soft tissue infection in the lateral forefoot centered on the fourth metatarsal and proximal phalanx with multiple abscesses measuring up to 5.5 cm and large area of devitalized, gangrenous soft tissue. 3. Diffuse forefoot cellulitis. Electronically Signed   By: Obie DredgeWilliam T Derry M.D.   On: 03/06/2019 08:41     ASSESSMENT AND PLAN:   43 year old male with a history of diabetes with neuropathy who was directly admitted from podiatry office due to concern for osteomyelitis.   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 Will need PICC line Continue vancomycin and cefepime Gram stain  shows abundant gram-positive cocci and gram-negative rods ID and podiatry consultation appreciated Resting change as per podiatry PT consult tomorrow  2.  Diabetes with neuropathy: Continue insulin pump   3.  ZOX:WRUEAVWUHTN:Continue lisinopril  4.  Depression: Continue BuSpar and Zoloft  5.  Hyperlipidemia: Continue statin  D/w dr troxler   Management plans discussed with the patient and he is in agreement.  CODE STATUS: full  TOTAL TIME TAKING CARE OF THIS PATIENT: 24 minutes.     POSSIBLE D/C Monday with [facility]HC DEPENDING ON CLINICAL CONDITION.   Adrian SaranSital Rosette Bellavance M.D on 03/07/2019 at 9:59 AM  Between 7am to 6pm - Pager - [phone] After 6pm go to [email or web address] - password [employer] [email or web address]  [facility] [place] [facility]  Office  [phone]  CC: Primary care physician; Barbette ReichmannHande, Vishwanath, MD  Note: This dictation was prepared with Dragon dictation along with smaller phrase technology. Any transcriptional errors that result from this process are unintentional.

## 2019-03-08 LAB — GLUCOSE, CAPILLARY
Glucose-Capillary: 147 mg/dL — ABNORMAL HIGH (ref 70–99)
Glucose-Capillary: 131 mg/dL — ABNORMAL HIGH (ref 70–99)
Glucose-Capillary: 165 mg/dL — ABNORMAL HIGH (ref 70–99)
Glucose-Capillary: 250 mg/dL — ABNORMAL HIGH (ref 70–99)

## 2019-03-08 NOTE — Evaluation (Signed)
Physical Therapy Evaluation Patient Details Name: Richard Mendoza Dalby MRN: [id number] DOB: 1975-11-28 Today's Date: 03/08/2019   History of Present Illness  Richard Mendoza Meter is a 43yo male [facility] direct admit from OP podiatry for worseningof foot wound. Pt underwent 4th ray amputation with podiatry on 4/17, PT orders on 4/19 to evaluated, and teach RW for PWB (50%). Per RN, pt also asked to use a postop shoe whenever he is up and AMB.  Clinical Impression  Pt admitted with above diagnosis. Pt currently with functional limitations due to the deficits listed below (see "PT Problem List"). Upon entry, pt in bed, no family/caregiver present. The pt is easily made awake and agreeable to participate. Pt educated on specifics of PWB precautions, use of post-op shoe, and RW use, as they all pertain to basic mobility needed to access home. Pt is moving well in general, requires no assistance for mobility, but does require reminder for shoe and RW use. Functional mobility assessment demonstrates increased time, with minimally increased effort requirements, good tolerance, and no frank need for physical assistance, whereas the patient performed these at a higher level of independence PTA. Pt would benefit from stairs assessment at some point before DC, but he endorses high confidence in fluency at this time. Pt will benefit from skilled PT intervention to increase independence and safety with basic mobility in preparation for discharge to the venue listed below.       Follow Up Recommendations No PT follow up    Equipment Recommendations  Rolling walker with 5" wheels    Recommendations for Other Services       Precautions / Restrictions Precautions Precautions: Fall Required Braces or Orthoses: Other Brace Other Brace: Postop Shoe Restrictions RLE Weight Bearing: Partial weight bearing RLE Partial Weight Bearing Percentage or Pounds: "50%" with RW        Mobility  Bed  Mobility Overal bed mobility: Independent                Transfers Overall transfer level: Independent Equipment used: Rolling walker (2 wheeled)             General transfer comment: RW height adjusted, pt performed without instruction, and maintains weight bearing precautions  Ambulation/Gait Ambulation/Gait assistance: Modified independent (Device/Increase time) Gait Distance (Feet): 25 Feet Assistive device: Rolling walker (2 wheeled) Gait Pattern/deviations: WFL(Within Functional Limits)     General Gait Details: begins AMB without AD and without postop shoe; Pt reminded of and educated on details of weightbearing for foot and RW use. Pt takes RW for AMB to bathroom.   Stairs            Wheelchair Mobility    Modified Rankin (Stroke Patients Only)       Balance Overall balance assessment: Independent                                           Pertinent Vitals/Pain Pain Assessment: 0-10 Pain Score: 8  Pain Location: Rt foot Pain Descriptors / Indicators: Aching Pain Intervention(s): Premedicated before session;Monitored during session;Limited activity within patient's tolerance    Home Living Family/patient expects to be discharged to:: Private residence Living Arrangements: Spouse/significant other;Children(children at 5114, 3815 and 5ya; Wife availabel 24/7 ) Available Help at Discharge: Family Type of Home: House Home Access: Stairs to enter Entrance Stairs-Rails: Doctor, general practiceight;Left Entrance Stairs-Number of Steps: 3 Home Layout: One  level Home Equipment: None Additional Comments: considering aquiring a kneeling scooter     Prior Function Level of Independence: Independent         Comments: Pt very independent and active, 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 Extremity Assessment Upper Extremity Assessment: Overall WFL for tasks assessed    Lower Extremity Assessment Lower  Extremity Assessment: Overall WFL for tasks assessed       Communication   Communication: No difficulties  Cognition Arousal/Alertness: Awake/alert Behavior During Therapy: WFL for tasks assessed/performed Overall Cognitive Status: Within Functional Limits for tasks assessed                                        General Comments      Exercises     Assessment/Plan    PT Assessment Patient needs continued PT services  PT Problem List Decreased activity tolerance;Decreased mobility       PT Treatment Interventions DME instruction;Therapeutic exercise;Gait training;Functional mobility training;Stair training;Therapeutic activities;Patient/family education    PT Goals (Current goals can be found in the Care Plan section)  Acute Rehab PT Goals Patient Stated Goal: Return to home and maintain low activity to [facility] healing potential PT Goal Formulation: With patient Time For Goal Achievement: 03/22/19 Potential to Achieve Goals: Good    Frequency 7X/week   Barriers to discharge        Co-evaluation               AM-PAC PT "6 Clicks" Mobility  Outcome Measure Help needed turning from your back to your side while in a flat bed without using bedrails?: None Help needed moving from lying on your back to sitting on the side of a flat bed without using bedrails?: None Help needed moving to and from a bed to a chair (including a wheelchair)?: None Help needed standing up from a chair using your arms (e.g., wheelchair or bedside chair)?: None Help needed to walk in hospital room?: A Little Help needed climbing 3-5 steps with a railing? : A Little 6 Click Score: 22    End of Session   Activity Tolerance: Patient tolerated treatment well Patient left: in bed Nurse Communication: Mobility status;Weight bearing status PT Visit Diagnosis: Other abnormalities of gait and mobility (R26.89);Difficulty in walking, not elsewhere classified (R26.2)    Time:  4268-3419 PT Time Calculation (min) (ACUTE ONLY): 15 min   Charges:   PT Evaluation $PT Eval Low Complexity: 1 Low          2:44 PM, 03/08/19 Rosamaria Lints, PT, DPT Physical Therapist - [facility]  [phone] (ASCOM)     Westly Hinnant C 03/08/2019, 2:40 PM

## 2019-03-08 NOTE — Progress Notes (Signed)
  [facility] Podiatry                                                      Patient Demographics  Richard Mendoza, is a 43 y.o. male   MRN: [id number]   DOB - [birth year]  Admit Date - 03/05/2019    Outpatient Primary MD for the patient is Barbette Reichmann, MD  Consult requested in the Hospital by Adrian Saran, MD, On 03/08/2019     Past Medical History:  Diagnosis Date  . DIABETES MELLITUS, TYPE II, UNCONTROLLED 03/17/2009  . DM 12/08/2008  . HYPERLIPIDEMIA 03/17/2009  . HYPERTENSION 12/08/2008  . 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 ANTERIOR CERVICAL DECOMP/DISCECTOMY FUSION N/A 09/09/2017   Procedure: ANTERIOR CERVICAL DECOMPRESSION/DISCECTOMY FUSION CERVICAL 6- CERVICAL 7;  Surgeon: Coletta Memos, MD;  Location: [facility] OR;  Service: Neurosurgery;  Laterality: N/A;  ANTERIOR CERVICAL DECOMPRESSION/DISCECTOMY FUSION CERVICAL 6- CERVICAL 7  . APPENDECTOMY    . I&D EXTREMITY Right 10/03/2017   Procedure: IRRIGATION AND DEBRIDEMENT RIGHT WRIST;  Surgeon: Betha Loa, MD;  Location: [facility] OR;  Service: Orthopedics;  Laterality: Right;  . I&D EXTREMITY Right 11/26/2018   Procedure: IRRIGATION AND DEBRIDEMENT FASCIA ON RIGHT FOOT;  Surgeon: Gwyneth Revels, DPM;  Location: [facility] ORS;  Service: Podiatry;  Laterality: Right;  . osteomylitis    . ROTATOR CUFF REPAIR Left     in for   No chief complaint on file.    HPI  Richard Mendoza  is a 43 y.o. male, 2 days status post fourth ray amputation right foot.  Patient was hospitalized due to right foot diabetic foot infection with osteomyelitis to the fourth ray and significant abscess and cellulitis formation to the right foot Dr. Ether Griffins perform surgery on Friday   Physical Exam: Patient is alert and well-oriented  Vitals  Blood pressure 123/82, pulse 65, temperature 98 F  (36.7 C), temperature source Oral, resp. rate 19, height 5\' 10"  (1.778 m), weight 97.1 kg, SpO2 97 %.  Lower Extremity exam: Bandages removed today.  There appears to be a little less cellulitis to the region.  Packing was removed no frank pus was noted on the regions.  Drainage is reduced as compared to yesterday.  Sutures are intact dorsally on the incision margin. Assessment & Plan: Patient seems to be improving some.  Packing removed and changed today along with fresh dressing.  He had been up to go to the bathroom but only had some minor bleeding to the region.  I will order a physical therapy consult and order for him to have a walker to take 50% of the weight off his foot when he gets up to go to the bathroom.  He can also transfer to a bedside chair.  Still waiting for culture reports.  He has not had a PICC line put in yet but will likely get one tomorrow.  Gram stain showed positive cocci and negative rods.  Active Problems:   Diabetic foot infection [facility])   Family Communication: Plan discussed with patient   Recardo Evangelist M.D on 03/08/2019 at 9:50 AM  Thank you for the consult, we will follow the patient with you in the Hospital.

## 2019-03-08 NOTE — Progress Notes (Signed)
[employer] - [place] at [facility]   PATIENT NAME: Richard Mendoza    MR#:  [id number]  DATE OF BIRTH:  1976/03/31  SUBJECTIVE:   Patient doing well this morning.  No acute events overnight.  Patient evaluated by Dr. Orland Jarred earlier this morning.  Bandages removed.  REVIEW OF SYSTEMS:    Review of Systems  Constitutional: Negative for fever, chills weight loss HENT: Negative for ear pain, nosebleeds, congestion, facial swelling, rhinorrhea, neck pain, neck stiffness and ear discharge.   Respiratory: Negative for cough, shortness of breath, wheezing  Cardiovascular: Negative for chest pain, palpitations and leg swelling.  Gastrointestinal: Negative for heartburn, abdominal pain, vomiting, diarrhea or consitpation Genitourinary: Negative for dysuria, urgency, frequency, hematuria Musculoskeletal: Negative for back pain or joint pain Neurological: Negative for dizziness, seizures, syncope, focal weakness,  numbness and headaches.  Hematological: Does not bruise/bleed easily.  Psychiatric/Behavioral: Negative for hallucinations, confusion, dysphoric mood    Tolerating Diet:  yes      DRUG ALLERGIES:  No Known Allergies  VITALS:  Blood pressure 123/82, pulse 65, temperature 98 F (36.7 C), temperature source Oral, resp. rate 19, height 5\' 10"  (1.778 m), weight 97.1 kg, SpO2 97 %.  PHYSICAL EXAMINATION:  Constitutional: Appears well-developed and well-nourished. No distress. HENT: Normocephalic. Marland Kitchen Oropharynx is clear and moist.  Eyes: Conjunctivae and EOM are normal. PERRLA, no scleral icterus.  Neck: Normal ROM. Neck supple. No JVD. No tracheal deviation. CVS: RRR, S1/S2 +, no murmurs, no gallops, no carotid bruit.  Pulmonary: Effort and breath sounds normal, no stridor, rhonchi, wheezes, rales.  Abdominal: Soft. BS +,  no distension, tenderness, rebound or guarding.  Musculoskeletal: Normal range of motion. No edema and no tenderness.  Neuro: Alert. CN  2-12 grossly intact. No focal deficits. Skin: cellulitis around dorsum of foot improved Small ulcer 5th MT 4th toe ray amputation Psychiatric: Normal mood and affect.      LABORATORY PANEL:   CBC Recent Labs  Lab 03/07/19 0502  WBC 11.9*  HGB 9.4*  HCT 29.5*  PLT 270   ------------------------------------------------------------------------------------------------------------------  Chemistries  Recent Labs  Lab 03/07/19 0502  NA 135  K 4.2  CL 103  CO2 23  GLUCOSE 257*  BUN 23*  CREATININE 1.16  CALCIUM 8.6*   ------------------------------------------------------------------------------------------------------------------  Cardiac Enzymes No results for input(s): TROPONINI in the last 168 hours. ------------------------------------------------------------------------------------------------------------------  RADIOLOGY:  Mr Foot Right W Wo Contrast  Result Date: 03/06/2019 CLINICAL DATA:  Worsening diabetic foot ulcer. Prior debridement on 11/26/2018. EXAM: MRI OF THE RIGHT FOREFOOT WITHOUT AND WITH CONTRAST TECHNIQUE: Multiplanar, multisequence MR imaging of the right forefoot was performed before and after the administration of intravenous contrast. CONTRAST:  10 mL Gadavist intravenous contrast. COMPARISON:  MRI right foot dated November 25, 2018. FINDINGS: Bones/Joint/Cartilage New abnormal marrow edema and enhancement with decreased T1 marrow signal involving the fourth metatarsal and proximal phalanx. Marrow edema involves the majority of the fourth metatarsal, sparing the base, with patchy, early T1 marrow signal hypointensity. There is bony destruction of the base of the fourth proximal phalanx and fourth metatarsal head. Dorsal dislocation of the fourth MTP joint. No fracture. Large, complex fourth MTP joint effusion. Ligaments Eroded fourth MTP joint collateral ligaments. Remaining collateral ligaments are intact. Muscles and Tendons Flexor, peroneal and extensor  compartment tendons are intact. Increased T2 signal within the intrinsic muscles of the forefoot, nonspecific, but likely related to diabetic muscle changes. Soft tissue Ulceration at the plantar base of the fourth metatarsal head again  with large 5.5 x 2.5 x 1.8 cm fluid collection extending from the ulceration proximally along the fourth flexor tendons and plantar fascia, consistent with abscess. There is also fluid extending through the fourth intermetatarsal space to an additional 2.1 x 2.0 x 1.4 cm fluid collection in the lateral dorsal foot overlying the distal fifth metatarsal. There is extensive forefoot soft tissue swelling, skin thickening, and enhancement, consistent with cellulitis. There is a large area of devitalized, nonenhancing soft tissue involving the intrinsic muscles and superficial soft tissues surrounding the fourth metatarsal and proximal phalanx with multiple foci of soft tissue emphysema. IMPRESSION: 1. 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 Abnormal signal extends to the metatarsal proximal metaphysis. There is destruction of the proximal phalanx base and fourth metatarsal head with resultant dorsal dislocation of the MTP joint. 2. Progressive soft tissue infection in the lateral forefoot centered on the fourth metatarsal and proximal phalanx with multiple abscesses measuring up to 5.5 cm and large area of devitalized, gangrenous soft tissue. 3. Diffuse forefoot cellulitis. Electronically Signed   By: Obie DredgeWilliam T Derry M.D.   On: 03/06/2019 08:41     ASSESSMENT AND PLAN:   43 year old male with a history of diabetes with neuropathy who was directly admitted from podiatry office due to concern for osteomyelitis.   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 Dr. Rudene Andaavi Shanker to see  patient tomorrow and discuss long-term antibiotics/PICC line. Currently he is agreeing to PICC line placement if needed.  Continue vancomycin and cefepime for now. Gram stain shows abundant gram-positive cocci and gram-negative rods.  Will need to follow-up with final culture and sensitivities ID and podiatry consultation appreciated  Physical therapy ordered for today. 50% of weight off his foot when he gets up to the bathroom, he may transfer to bedside chair.  2.  Diabetes with neuropathy: Continue insulin pump   3.  ZOX:WRUEAVWUHTN:Continue lisinopril  4.  Depression: Continue BuSpar and Zoloft  5.  Hyperlipidemia: Continue statin     Management plans discussed with the patient and he is in agreement.  CODE STATUS: full  TOTAL TIME TAKING CARE OF THIS PATIENT: 25 minutes.     POSSIBLE D/C Monday with [facility]HC DEPENDING ON CLINICAL CONDITION.   Adrian SaranSital Alexzavier Girardin M.D on 03/08/2019 at 10:02 AM  Between 7am to 6pm - Pager - [phone] After 6pm go to [email or web address] - password [email or web address]  [facility] [place] [facility]  Office  [phone]  CC: Primary care physician; Barbette ReichmannHande, Vishwanath, MD  Note: This dictation was prepared with Dragon dictation along with smaller phrase technology. Any transcriptional errors that result from this process are unintentional.

## 2019-03-09 ENCOUNTER — Encounter: Payer: Self-pay | Admitting: Podiatry

## 2019-03-09 ENCOUNTER — Inpatient Hospital Stay: Payer: Self-pay

## 2019-03-09 DIAGNOSIS — L089 Local infection of the skin and subcutaneous tissue, unspecified: Secondary | ICD-10-CM

## 2019-03-09 DIAGNOSIS — M869 Osteomyelitis, unspecified: Secondary | ICD-10-CM

## 2019-03-09 DIAGNOSIS — E11628 Type 2 diabetes mellitus with other skin complications: Secondary | ICD-10-CM

## 2019-03-09 DIAGNOSIS — B9689 Other specified bacterial agents as the cause of diseases classified elsewhere: Secondary | ICD-10-CM

## 2019-03-09 DIAGNOSIS — B966 Bacteroides fragilis [B. fragilis] as the cause of diseases classified elsewhere: Secondary | ICD-10-CM

## 2019-03-09 DIAGNOSIS — B9562 Methicillin resistant Staphylococcus aureus infection as the cause of diseases classified elsewhere: Secondary | ICD-10-CM

## 2019-03-09 DIAGNOSIS — Z89421 Acquired absence of other right toe(s): Secondary | ICD-10-CM

## 2019-03-09 DIAGNOSIS — E1169 Type 2 diabetes mellitus with other specified complication: Secondary | ICD-10-CM

## 2019-03-09 LAB — AEROBIC/ANAEROBIC CULTURE W GRAM STAIN (SURGICAL/DEEP WOUND)

## 2019-03-09 LAB — BASIC METABOLIC PANEL
Anion gap: 6 (ref 5–15)
BUN: 25 mg/dL — ABNORMAL HIGH (ref 6–20)
CO2: 24 mmol/L (ref 22–32)
Calcium: 9 mg/dL (ref 8.9–10.3)
Chloride: 108 mmol/L (ref 98–111)
Creatinine, Ser: 1.14 mg/dL (ref 0.61–1.24)
GFR calc Af Amer: >60 mL/min (ref >60)
GFR calc non Af Amer: >60 mL/min (ref >60)
Glucose, Bld: 267 mg/dL — ABNORMAL HIGH (ref 70–99)
Potassium: 4.8 mmol/L (ref 3.5–5.1)
Sodium: 138 mmol/L (ref 135–145)

## 2019-03-09 LAB — GLUCOSE, CAPILLARY
Glucose-Capillary: 199 mg/dL — ABNORMAL HIGH (ref 70–99)
Glucose-Capillary: 247 mg/dL — ABNORMAL HIGH (ref 70–99)
Glucose-Capillary: 220 mg/dL — ABNORMAL HIGH (ref 70–99)
Glucose-Capillary: 318 mg/dL — ABNORMAL HIGH (ref 70–99)

## 2019-03-09 LAB — VANCOMYCIN, TROUGH: Vancomycin Tr: 11 ug/mL — ABNORMAL LOW (ref 15–20)

## 2019-03-09 MED ORDER — SODIUM CHLORIDE 0.9% FLUSH
10.0000 mL | Freq: Two times a day (BID) | INTRAVENOUS | Status: DC
  Administered 2019-03-09 – 2019-03-10 (×2): 10 mL

## 2019-03-09 MED ORDER — CEFTRIAXONE IV (FOR PTA / DISCHARGE USE ONLY): 2.0000 g | INTRAVENOUS | 0 refills | Status: DC

## 2019-03-09 MED ORDER — VANCOMYCIN IV (FOR PTA / DISCHARGE USE ONLY): 2500.0000 mg | INTRAVENOUS | 0 refills | Status: DC

## 2019-03-09 MED ORDER — SODIUM CHLORIDE 0.9 % IV SOLN
2.0000 g | Freq: Three times a day (TID) | INTRAVENOUS | Status: DC
  Filled 2019-03-09 (×2): qty 2

## 2019-03-09 MED ORDER — METRONIDAZOLE 500 MG PO TABS
500.0000 mg | ORAL_TABLET | Freq: Three times a day (TID) | ORAL | Status: DC
  Administered 2019-03-09 – 2019-03-10 (×3): 500 mg via ORAL
  Filled 2019-03-09 (×3): qty 1

## 2019-03-09 MED ORDER — VANCOMYCIN HCL 10 G IV SOLR
2500.0000 mg | INTRAVENOUS | Status: DC
  Administered 2019-03-10: 2500 mg via INTRAVENOUS
  Filled 2019-03-09: qty 2500

## 2019-03-09 MED ORDER — METRONIDAZOLE 500 MG PO TABS: 500.0000 mg | ORAL_TABLET | Freq: Three times a day (TID) | ORAL | 0 refills | Status: DC

## 2019-03-09 MED ORDER — SODIUM CHLORIDE 0.9% FLUSH
10.0000 mL | INTRAVENOUS | Status: DC | PRN
  Administered 2019-03-10: 06:00:00 10 mL
  Filled 2019-03-09: qty 40

## 2019-03-09 MED ORDER — SODIUM CHLORIDE 0.9 % IV SOLN
3.0000 g | Freq: Four times a day (QID) | INTRAVENOUS | Status: DC
  Filled 2019-03-09 (×3): qty 3

## 2019-03-09 MED ORDER — SODIUM CHLORIDE 0.9 % IV SOLN
2.0000 g | INTRAVENOUS | Status: DC
  Administered 2019-03-09: 2 g via INTRAVENOUS
  Filled 2019-03-09: qty 20
  Filled 2019-03-09: qty 2

## 2019-03-09 NOTE — Progress Notes (Signed)
   Date of Admission:  03/05/2019      Subjective: Doing better  Medications:  . aMILoride  5 mg Oral Daily  . atorvastatin  40 mg Oral q1800  . busPIRone  10 mg Oral BID  . Chlorhexidine Gluconate Cloth  6 each Topical Q0600  . famotidine  20 mg Oral BID  . gabapentin  600 mg Oral QHS  . insulin pump   Subcutaneous Q4H  . lisinopril  5 mg Oral Daily  . mupirocin ointment  1 application Nasal BID  . nicotine  21 mg Transdermal Daily  . pantoprazole  40 mg Oral Daily  . sertraline  100 mg Oral Daily  . vancomycin variable dose per unstable renal function (pharmacist dosing)   Does not apply See admin instructions    Objective: Vital signs in last 24 hours: Temp:  [97.5 F (36.4 C)-98.2 F (36.8 C)] 97.8 F (36.6 C) (04/20 0426) Pulse Rate:  [59-63] 59 (04/20 0426) Resp:  [16-19] 16 (04/20 0426) BP: (116-127)/(80-85) 127/80 (04/20 0426) SpO2:  [99 %] 99 % (04/20 0426)  PHYSICAL EXAM:  General: Alert, cooperative, no distress, appears stated age.  Head: Normocephalic, without obvious abnormality, atraumatic. Eyes: Conjunctivae clear, anicteric sclerae. Pupils are equal ENT Nares normal. No drainage or sinus tenderness. Lips, mucosa, and tongue normal. No Thrush Neck: Supple, symmetrical, no adenopathy, thyroid: non tender no carotid bruit and no JVD. Back: No CVA tenderness. Lungs: Clear to auscultation bilaterally. No Wheezing or Rhonchi. No rales. Heart: Regular rate and rhythm, no murmur, rub or gallop. Abdomen: Soft, non-tender,not distended. Bowel sounds normal. No masses Extremities: rt foot 4th toe and methead amputation and I/D of abscess       Lab Results Recent Labs    03/07/19 0502 03/09/19 0434  WBC 11.9*  --   HGB 9.4*  --   HCT 29.5*  --   NA 135 138  K 4.2 4.8  CL 103 108  CO2 23 24  BUN 23* 25*  CREATININE 1.16 1.14   Liver Panel No results for input(s): PROT, ALBUMIN, AST, ALT, ALKPHOS, BILITOT, BILIDIR, IBILI in the last 72  hours. Sedimentation Rate No results for input(s): ESRSEDRATE in the last 72 hours. C-Reactive Protein No results for input(s): CRP in the last 72 hours.  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  I&D plantar right foot Polymicrobial infection with MRSA, enterococcus, anerobes, enterobacter He will get minimum 4 weeks of Iv antibiotics- vancomycin and ceftriaxone by IV and 2 weeks of Po flagyl 500mg  Q8. Await pathology to confirm clearance of infection at the bone margin. Follow up as OP Pt seen with Dr.Troxler- Discussed the management with him in detail

## 2019-03-09 NOTE — Progress Notes (Signed)
Peripherally Inserted Central Catheter/Midline Placement  The IV Nurse has discussed with the patient and/or persons authorized to consent for the patient, the purpose of this procedure and the potential benefits and risks involved with this procedure.  The benefits include less needle sticks, lab draws from the catheter, and the patient may be discharged home with the catheter. Risks include, but not limited to, infection, bleeding, blood clot (thrombus formation), and puncture of an artery; nerve damage and irregular heartbeat and possibility to perform a PICC exchange if needed/ordered by physician.  Alternatives to this procedure were also discussed.  Bard Power PICC patient education guide, fact sheet on infection prevention and patient information card has been provided to patient /or left at bedside.    PICC/Midline Placement Documentation  PICC Single Lumen 03/09/19 PICC Right Basilic 43 cm 1 cm (Active)  Indication for Insertion or Continuance of Line Home intravenous therapies (PICC only) 03/09/2019  3:00 PM  Exposed Catheter (cm) 1 cm 03/09/2019  3:00 PM  Site Assessment Clean;Dry;Intact 03/09/2019  3:00 PM  Line Status Flushed;Blood return noted 03/09/2019  3:00 PM  Dressing Type Transparent 03/09/2019  3:00 PM  Dressing Status Clean;Dry;Intact;Antimicrobial disc in place 03/09/2019  3:00 PM  Dressing Change Due 03/16/19 03/09/2019  3:00 PM       Stacie Glaze Horton 03/09/2019, 3:40 PM

## 2019-03-09 NOTE — Progress Notes (Signed)
[employer] [facility] [facility] [place] [facility]   PATIENT NAME: Richard LeepMichael Mendoza    MR#:  [id number]  DATE OF BIRTH:  Nov 05, 1976  SUBJECTIVE:   Patient doing well this morning.  No acute events overnight.  Patient is followed by Dr. Orland Jarredroxler For PICC line today  REVIEW OF SYSTEMS:    Review of Systems  Constitutional: Negative for fever, chills weight loss HENT: Negative for ear pain, nosebleeds, congestion, facial swelling, rhinorrhea, neck pain, neck stiffness and ear discharge.   Respiratory: Negative for cough, shortness of breath, wheezing  Cardiovascular: Negative for chest pain, palpitations and leg swelling.  Gastrointestinal: Negative for heartburn, abdominal pain, vomiting, diarrhea or consitpation Genitourinary: Negative for dysuria, urgency, frequency, hematuria Musculoskeletal: Negative for back pain or joint pain Neurological: Negative for dizziness, seizures, syncope, focal weakness,  numbness and headaches.  Hematological: Does not bruise/bleed easily.  Psychiatric/Behavioral: Negative for hallucinations, confusion, dysphoric mood    Tolerating Diet:  yes      DRUG ALLERGIES:  No Known Allergies  VITALS:  Blood pressure 127/80, pulse (!) 59, temperature 97.8 F (36.6 C), resp. rate 16, height 5\' 10"  (1.778 m), weight 97.1 kg, SpO2 99 %.  PHYSICAL EXAMINATION:  Constitutional: Appears well-developed and well-nourished. No distress. HENT: Normocephalic. Marland Kitchen. Oropharynx is clear and moist.  Eyes: Conjunctivae and EOM are normal. PERRLA, no scleral icterus.  Neck: Normal ROM. Neck supple. No JVD. No tracheal deviation. CVS: RRR, S1/S2 +, no murmurs, no gallops, no carotid bruit.  Pulmonary: Effort and breath sounds normal, no stridor, rhonchi, wheezes, rales.  Abdominal: Soft. BS +,  no distension, tenderness, rebound or guarding.  Musculoskeletal: Normal range of motion. No edema and no tenderness.  Neuro: Alert. CN 2-12 grossly intact. No focal  deficits. Skin: cellulitis around dorsum of foot improved Small ulcer 5th MT 4th toe ray amputation Psychiatric: Normal mood and affect.      LABORATORY PANEL:   CBC Recent Labs  Lab 03/07/19 0502  WBC 11.9*  HGB 9.4*  HCT 29.5*  PLT 270   ------------------------------------------------------------------------------------------------------------------  Chemistries  Recent Labs  Lab 03/09/19 0434  NA 138  K 4.8  CL 108  CO2 24  GLUCOSE 267*  BUN 25*  CREATININE 1.14  CALCIUM 9.0   ------------------------------------------------------------------------------------------------------------------  Cardiac Enzymes No results for input(s): TROPONINI in the last 168 hours. ------------------------------------------------------------------------------------------------------------------  RADIOLOGY:  Mr Foot Right W Wo Contrast  Result Date: 03/06/2019 CLINICAL DATA:  Worsening diabetic foot ulcer. Prior debridement on 11/26/2018. EXAM: MRI OF THE RIGHT FOREFOOT WITHOUT AND WITH CONTRAST TECHNIQUE: Multiplanar, multisequence MR imaging of the right forefoot was performed before and after the administration of intravenous contrast. CONTRAST:  10 mL Gadavist intravenous contrast. COMPARISON:  MRI right foot dated November 25, 2018. FINDINGS: Bones/Joint/Cartilage New abnormal marrow edema and enhancement with decreased T1 marrow signal involving the fourth metatarsal and proximal phalanx. Marrow edema involves the majority of the fourth metatarsal, sparing the base, with patchy, early T1 marrow signal hypointensity. There is bony destruction of the base of the fourth proximal phalanx and fourth metatarsal head. Dorsal dislocation of the fourth MTP joint. No fracture. Large, complex fourth MTP joint effusion. Ligaments Eroded fourth MTP joint collateral ligaments. Remaining collateral ligaments are intact. Muscles and Tendons Flexor, peroneal and extensor compartment tendons are  intact. Increased T2 signal within the intrinsic muscles of the forefoot, nonspecific, but likely related to diabetic muscle changes. Soft tissue Ulceration at the plantar base of the fourth metatarsal head again with large 5.5  x 2.5 x 1.8 cm fluid collection extending from the ulceration proximally along the fourth flexor tendons and plantar fascia, consistent with abscess. There is also fluid extending through the fourth intermetatarsal space to an additional 2.1 x 2.0 x 1.4 cm fluid collection in the lateral dorsal foot overlying the distal fifth metatarsal. There is extensive forefoot soft tissue swelling, skin thickening, and enhancement, consistent with cellulitis. There is a large area of devitalized, nonenhancing soft tissue involving the intrinsic muscles and superficial soft tissues surrounding the fourth metatarsal and proximal phalanx with multiple foci of soft tissue emphysema. IMPRESSION: 1. 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 Abnormal signal extends to the metatarsal proximal metaphysis. There is destruction of the proximal phalanx base and fourth metatarsal head with resultant dorsal dislocation of the MTP joint. 2. Progressive soft tissue infection in the lateral forefoot centered on the fourth metatarsal and proximal phalanx with multiple abscesses measuring up to 5.5 cm and large area of devitalized, gangrenous soft tissue. 3. Diffuse forefoot cellulitis. Electronically Signed   By: Obie Dredge M.D.   On: 03/06/2019 08:41     ASSESSMENT AND PLAN:   43 year old male with a history of diabetes with neuropathy who was directly admitted from podiatry office due to concern for osteomyelitis.   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 Dr. Rudene Anda will see the patient today and  recommend antibiotics/PICC line. Blood cultures are negative for 4 days.  PICC line ordered Continue vancomycin and cefepime for now. Wound culture is revealing Enterococcus faecalis staph aureus and gram-negative rods.  Reintubated.Will need to follow-up with final culture and sensitivities ID and podiatry consultation appreciated 50% of weight off his foot when he gets up to the bathroom, he may transfer to bedside chair.  2.  Diabetes with neuropathy: Continue insulin pump   3.  WUX:[id number] lisinopril  4.  Depression: Continue BuSpar and Zoloft  5.  Hyperlipidemia: Continue statin  6.  General weakness PT - no PT needs identified.  Rolling walker with 5 inch wheels at the time of discharge   Management plans discussed with the patient and he is in agreement.  CODE STATUS: full  TOTAL TIME TAKING CARE OF THIS PATIENT: 33 minutes.     POSSIBLE D/C Monday with [facility] DEPENDING ON CLINICAL CONDITION.   Ramonita Lab M.D on 03/09/2019 at 12:15 PM  Between 7am to 6pm - Pager - [phone] After 6pm go to [email or web address] - password [email or web address]  [facility]  Office  [phone]  CC: Primary care physician; Barbette Reichmann, MD  Note: This dictation was prepared with Dragon dictation along with smaller phrase technology. Any transcriptional errors that result from this process are unintentional.

## 2019-03-09 NOTE — Treatment Plan (Signed)
Diagnosis: Rt foot infection with osto Baseline Creatinine 1.14  Culture Result: MRSA, enterococcus, enterobacter and bacteroides  No Known Allergies  OPAT Orders Discharge antibiotics: VAncomyin 2592m IV every 24 hours X 4 weeks- until 04/06/19 Ceftriaxone 2 grams IV every 24 hours X 4 weeks until 04/06/19 Flagyl 5034morally every 8 hours X 2 weeks until 03/23/19  [facility]:  Labs weekly on Monday  while on IV antibiotics: _X_ CBC with differential _X_ CMP X vanco trough  Labs every Thursday while on antibiotics BMP VAnco trough  Labs once every 2 weeks on Monday- ESR/CRP  ---------------------------------------------------------------------------------------   _X__ Please leave PIC in place until doctor has seen patient or been notified  Fax weekly labs to ([phone] Follow Up Appt: 2 weeks  Call [phone] make appt

## 2019-03-09 NOTE — Consult Note (Signed)
Pharmacy Antibiotic Note  Richard Mendoza is a 43 y.o. male admitted on 03/05/2019 with Osteomyelitis of the right foot.  Pharmacy has been consulted for vancomycin dosing.  He recently had a PICC line placed and received IV vancomycin and cefepime for 2 weeks. His dose of vancomycin was 1500 mg every 12 hours: levels were therapeutic.   Plan:  1) Vanc Random level 6. Scr and CrCl improved. Will change vancomycin dose to Vancomycin 2500 mg IV Q 24  hrs. Goal AUC 400-550. Expected AUC: 466 SCr used: 1.14 T1/2: 9 hours   Height: 5\' 10"  (177.8 cm) Weight: 214 lb (97.1 kg) IBW/kg (Calculated) : 73  Antimicrobials this admission: 4/16 cefepime >> 4/20 4/16 vancomycin >>  4/20 Rocephin >> 4/20 Flagyl >>  Microbiology results: 4/16 BCx pending  Thank you for allowing pharmacy to be a part of this patient's care.  Bettey Costa, PharmD 03/09/2019 1:09 PM

## 2019-03-09 NOTE — Progress Notes (Signed)
PHARMACY CONSULT NOTE FOR:  OUTPATIENT  PARENTERAL ANTIBIOTIC THERAPY (OPAT)  Indication:  MRSA, enterococcus, enterobacter and bacteroides Regimen: VAncomyin 2500mg  IV every 24 hours X 4 weeks- until 04/06/19  Ceftriaxone 2 grams IV every 24 hours X 4 weeks until 04/06/19  Flagyl 500mg  orally every 8 hours X 2 weeks until 03/23/19   IV antibiotic discharge orders are pended. To discharging provider:  please sign these orders via discharge navigator,  Select New Orders & click on the button choice - Manage This Unsigned Work.     Thank you for allowing pharmacy to be a part of this patient's care.  Bettey Costa, PharmD Clinical Pharmacist 03/09/2019 2:15 PM

## 2019-03-09 NOTE — Progress Notes (Signed)
Pt refuses insulin from me stating he gives himself insulin via his pump and only when he has food sitting in front of himself.

## 2019-03-09 NOTE — Progress Notes (Signed)
Physical Therapy Treatment Patient Details Name: Richard Mendoza MRN: [id number] DOB: Mar 22, 1976 Today's Date: 03/09/2019    History of Present Illness Richard Mendoza is a 43yo male who comes to [facility] on 4/16 direct admit from OP podiatry for worseningof foot wound. Pt underwent 4th ray amputation with podiatry on 4/17, PT orders on 4/19 to evaluated, and teach RW for PWB (50%). Per RN, pt also asked to use a postop shoe whenever he is up and AMB.    PT Comments    Patient in bed , awake and alert on arrival. Patient denies needs for physical therapy. Another provider was presently working with the patient. Patient able to articulate 50% PWB on operative foot utilizing RW. Patient had received post-op shoe and indicated his eagerness to receive his RW. Patient denied any questions at this time and expressed complete confidence in his ability to negotiate RW, precautions, and limited mobility requirements. Based on patient's evaluation and expression of complete confidence in mobility and DME use, PT has updated frequency to 2x/week. Patient left in bed with other provider in room; all needs met.   Follow Up Recommendations  No PT follow up     Equipment Recommendations  Rolling walker with 5" wheels       Precautions / Restrictions Precautions Precautions: Fall Required Braces or Orthoses: Other Brace Other Brace: Postop Shoe Restrictions Weight Bearing Restrictions: Yes RLE Weight Bearing: Partial weight bearing RLE Partial Weight Bearing Percentage or Pounds: "50%" with RW                      Pertinent Vitals/Pain Pain Assessment: Faces Faces Pain Scale: No hurt           PT Goals (current goals can now be found in the care plan section)      Frequency    Min 2X/week      PT Plan Frequency needs to be updated       AM-PAC PT "6 Clicks" Mobility   Outcome Measure  Help needed turning from your back to your side while in a flat bed without using bedrails?:  None Help needed moving from lying on your back to sitting on the side of a flat bed without using bedrails?: None Help needed moving to and from a bed to a chair (including a wheelchair)?: None Help needed standing up from a chair using your arms (e.g., wheelchair or bedside chair)?: None Help needed to walk in hospital room?: A Little Help needed climbing 3-5 steps with a railing? : A Little 6 Click Score: 22    End of Session   Activity Tolerance: Patient tolerated treatment well Patient left: in bed Nurse Communication: Mobility status;Weight bearing status PT Visit Diagnosis: Other abnormalities of gait and mobility (R26.89);Difficulty in walking, not elsewhere classified (R26.2)     Time: 1497-0263 PT Time Calculation (min) (ACUTE ONLY): 2 min  Charges:                       Myles Gip PT, DPT [phone] 03/09/2019, 2:59 PM

## 2019-03-09 NOTE — TOC Initial Note (Signed)
Transition of Care [facility]) - Initial/Assessment Note    Patient Details  Name: Vance Rasberry MRN: [id number] Date of Birth: 04-25-1976  [facility] CM/SW Contact:    Barrie Dunker, RN Phone Number: 03/09/2019, 10:06 AM  Clinical Narrative:                 Spoke with the patient to discuss DC plan and needs, he stated that this is the 2nd go around with this situation, he declines the need for Wound care or for [facility] services other than IV infusion, he does want the physician to order the suppliers for the dressing of the wound so that he can get it covered with Medicaid, sent physician a message requesting that RX to be written upon DC,  He stated that he does need a RW and no other DME,  Gave permission to speak to [facility] with [facility] to get the IV ABX set up  I called and spoke Francia Greaves to notify of the IV ABX need Patient is still awaiting PICC line placement Pam stated that they would set up and get the nursing set up as well I notified Brad with [employer] of the RW need  Gave Patient my contact information for any further needs Will continue to Monitor       Patient Goals and CMS Choice        Expected Discharge Plan and Services                                    Prior Living Arrangements/Services                       Activities of Daily Living Home Assistive Devices/Equipment: Insulin Pump ADL Screening (condition at time of admission) Patient's cognitive ability adequate to safely complete daily activities?: Yes Is the patient deaf or have difficulty hearing?: No Does the patient have difficulty seeing, even when wearing glasses/contacts?: No Does the patient have difficulty concentrating, remembering, or making decisions?: No Patient able to express need for assistance with ADLs?: Yes Does the patient have difficulty dressing or bathing?: No Independently performs ADLs?: Yes (appropriate for developmental age) Does the patient  have difficulty walking or climbing stairs?: No Weakness of Legs: None Weakness of Arms/Hands: None  Permission Sought/Granted                  Emotional Assessment              Admission diagnosis:  osteomylitis rt foot Patient Active Problem List   Diagnosis Date Noted  . Diabetic foot infection (HCC) 03/05/2019  . Abscess 11/25/2018  . Intractable nausea and vomiting 10/21/2017  . HNP (herniated nucleus pulposus), cervical 09/09/2017  . Routine general medical examination at a health care facility 01/20/2014  . 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 HYPERLIPIDEMIA 03/17/2009  . DM 12/08/2008  . OBSTRUCTIVE SLEEP APNEA 12/08/2008  . Essential hypertension 12/08/2008   PCP:  Barbette Reichmann, MD Pharmacy:   [facility] [address], [place] - [address] [place] [address] Phone: [phone] Fax: [phone]     Social Determinants of Health (SDOH) Interventions    Readmission Risk Interventions No flowsheet data found.

## 2019-03-09 NOTE — Progress Notes (Signed)
[facility] Podiatry                                                      Patient Demographics  Richard Mendoza, is a 43 y.o. male   MRN: [id number]   DOB - 12/02/1975  Admit Date - 03/05/2019    Outpatient Primary MD for the patient is Barbette Reichmann, MD  With History of -  Past Medical History:  Diagnosis Date  . DIABETES MELLITUS, TYPE II, UNCONTROLLED 03/17/2009  . DM 12/08/2008  . HYPERLIPIDEMIA 03/17/2009  . HYPERTENSION 12/08/2008  . 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 ANTERIOR CERVICAL DECOMP/DISCECTOMY FUSION N/A 09/09/2017   Procedure: ANTERIOR CERVICAL DECOMPRESSION/DISCECTOMY FUSION CERVICAL 6- CERVICAL 7;  Surgeon: Coletta Memos, MD;  Location: [facility] OR;  Service: Neurosurgery;  Laterality: N/A;  ANTERIOR CERVICAL DECOMPRESSION/DISCECTOMY FUSION CERVICAL 6- CERVICAL 7  . APPENDECTOMY    . I&D EXTREMITY Right 10/03/2017   Procedure: IRRIGATION AND DEBRIDEMENT RIGHT WRIST;  Surgeon: Betha Loa, MD;  Location: [facility] OR;  Service: Orthopedics;  Laterality: Right;  . I&D EXTREMITY Right 11/26/2018   Procedure: IRRIGATION AND DEBRIDEMENT FASCIA ON RIGHT FOOT;  Surgeon: Gwyneth Revels, DPM;  Location: [facility] ORS;  Service: Podiatry;  Laterality: Right;  . INCISION AND DRAINAGE Right 03/06/2019   Procedure: INCISION AND DRAINAGE RIGHT FOOT, WITH 4th RAY AMPUTATION;  Surgeon: Gwyneth Revels, DPM;  Location: [facility] ORS;  Service: Podiatry;  Laterality: Right;  . osteomylitis    . ROTATOR CUFF REPAIR Left     in for   No chief complaint on file.    HPI  Richard Mendoza  is a 43 y.o. male,3 days postop following fourth ray amputation and I&D of deep abscess to the right foot  Physical Exam: Patient is alert well oriented he denies any fever temperature or pain at this point.  States is been able to get up and go  the bathroom using a postop shoe in the walker and is done well with that.  Vitals  Blood pressure 127/80, pulse (!) 59, temperature 97.8 F (36.6 C), resp. rate 16, height 5\' 10"  (1.778 m), weight 97.1 kg, SpO2 99 %.  Lower Extremity exam: Photographs of the foot were taken today and entered into his chart.  Overall the foot looks much better the cellulitis is significantly decreased.  Still has a large cavity in the area where his foot was debrided and the packing was removed from these areas.  No overt pus was noted on the packing.  Overall he appears to be stabilizing and improving.  Data Review  CBC Recent Labs  Lab 03/05/19 1422 03/06/19 0310 03/07/19 0502  WBC 11.2* 9.8 11.9*  HGB 9.5* 8.8* 9.4*  HCT 30.2* 27.7* 29.5*  PLT 244 247 270  MCV 87.5 85.2 84.8  MCH 27.5 27.1 27.0  MCHC 31.5 31.8 31.9  RDW 14.1 14.2 14.3   ------------------------------------------------------------------------------------------------------------------  Chemistries  Recent Labs  Lab 03/05/19 1422 03/06/19 0310 03/07/19 0502 03/09/19 0434  NA 131* 134* 135 138  K 3.6 3.7 4.2 4.8  CL 97* 101 103 108  CO2 23 25 23 24   GLUCOSE 334* 339* 257* 267*  BUN 24* 21* 23* 25*  CREATININE 1.64* 1.23 1.16 1.14  CALCIUM 8.5* 8.5* 8.6* 9.0   -----Assessment &  Plan: Overall I think the patient is fairly stable his infection seems to be progressed and improved quite a bit cellulitis is reduced considerably no overt purulent drainage is noted.  I will change the packing again today redress it.  He should get a PICC line today.  He has a mixed bag of infections that are growing from the cultures done at surgery last Friday.  Dr. Noralee Spaceavi Shankar is working on IV antibiotics form to be utilized over the next 4 to 6 weeks.  I explained to him that he needs to stay off this is much as possible just get up and go the bathroom removed from the bed to the couch.  He will need home health care to change the packing at  least every other day preferably every day for a week.  Recommend he get an appointment to see Dr. Ether GriffinsFowler in the office next week  Active Problems:   Diabetic foot infection [facility](HCC)   Family Communication: Plan discussed with patient.  Richard Mendoza M.D on 03/09/2019 at 12:52 PM  Thank you for the consult, we will follow the patient with you in the Hospital.

## 2019-03-10 LAB — CULTURE, BLOOD (ROUTINE X 2)
Culture: NO GROWTH
Culture: NO GROWTH
Special Requests: ADEQUATE

## 2019-03-10 LAB — GLUCOSE, CAPILLARY
Glucose-Capillary: 181 mg/dL — ABNORMAL HIGH (ref 70–99)
Glucose-Capillary: 259 mg/dL — ABNORMAL HIGH (ref 70–99)

## 2019-03-10 LAB — SURGICAL PATHOLOGY

## 2019-03-10 MED ORDER — DOCUSATE SODIUM 100 MG PO CAPS: 100.0000 mg | ORAL_CAPSULE | Freq: Two times a day (BID) | ORAL | 0 refills | Status: DC | PRN

## 2019-03-10 MED ORDER — METRONIDAZOLE 500 MG PO TABS: 500.0000 mg | ORAL_TABLET | Freq: Three times a day (TID) | ORAL | 0 refills | Status: AC

## 2019-03-10 MED ORDER — MUPIROCIN 2 % EX OINT: 1.0000 "application " | TOPICAL_OINTMENT | Freq: Two times a day (BID) | CUTANEOUS | 0 refills | Status: DC

## 2019-03-10 MED ORDER — VANCOMYCIN IV (FOR PTA / DISCHARGE USE ONLY): 2500.0000 mg | INTRAVENOUS | 0 refills | Status: AC

## 2019-03-10 MED ORDER — DOCUSATE SODIUM 100 MG PO CAPS: 100.0000 mg | ORAL_CAPSULE | Freq: Two times a day (BID) | ORAL | Status: DC | PRN

## 2019-03-10 MED ORDER — OXYCODONE HCL 5 MG PO TABS: 5.0000 mg | ORAL_TABLET | Freq: Four times a day (QID) | ORAL | 0 refills | Status: DC | PRN

## 2019-03-10 MED ORDER — CEFTRIAXONE IV (FOR PTA / DISCHARGE USE ONLY): 2.0000 g | INTRAVENOUS | 0 refills | Status: AC

## 2019-03-10 NOTE — Progress Notes (Signed)
Patient does not want to wait for [place] to make his discharge follow up appointments, the offices are currently closed for lunch.  Provided him with the MD and phone numbers to call and make appointments himself.  He verbalized understanding.  Orson Ape, BSN

## 2019-03-10 NOTE — Discharge Summary (Signed)
[facility] NAME: Richard Mendoza    MR#:  [id number]  DATE OF BIRTH:  [birth year]  DATE OF ADMISSION:  03/05/2019 ADMITTING PHYSICIAN: Sela Hua, MD  DATE OF DISCHARGE:  03/10/19  PRIMARY CARE PHYSICIAN: Tracie Harrier, MD    ADMISSION DIAGNOSIS:  osteomylitis rt foot  DISCHARGE DIAGNOSIS:  Right foot osteomyelitis  SECONDARY DIAGNOSIS:   Past Medical History:  Diagnosis Date  . DIABETES MELLITUS, TYPE II, UNCONTROLLED 03/17/2009  . DM 12/08/2008  . HYPERLIPIDEMIA 03/17/2009  . HYPERTENSION 12/08/2008  . 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 Dr. Levester Fresh -recommending minimum 4 weeks of Iv antibiotics- vancomycin and ceftriaxone by IV and 2 weeks of Po flagyl 553m Q8. Await pathology to confirm clearance of infection at the bone margin.  Fax weekly labs CBC and CMP to Dr. RLevester FreshFollow up as OP with ID and Dr. TElvina Mattes Blood cultures are negative for 4 days.  PICC line placed on 03/09/2019 Patient was treated with IV vancomycin and cefepime during the hospital course Wound culture is revealing Enterococcus faecalis staph aureus and gram-negative rods.    Reviewed sensitivities  ID and podiatry consultation appreciated 50% of weight off his foot when he gets up to the bathroom, he may transfer to bedside chair.  2.  Diabetes with neuropathy: Continue insulin pump  Patient was not seen by endocrinologist recently needs follow-up for insulin pump dosing adjustments  3.  HQJF:HLKTGYBWlisinopril  4.  Depression: Continue BuSpar and Zoloft  5.  Hyperlipidemia: Continue statin  6.  General weakness PT - no PT needs identified.  Rolling walker with 5 inch wheels at the  time of discharge Home health RN  DISCHARGE CONDITIONS:   stable  CONSULTS OBTAINED:     PROCEDURES s/p 4th toe ray amputation  DRUG ALLERGIES:  No Known Allergies  DISCHARGE MEDICATIONS:   Allergies as of 03/10/2019   No Known Allergies     Medication List    STOP taking these medications   doxycycline 100 MG tablet Commonly known as:  VIBRA-TABS   HYDROcodone-acetaminophen 5-325 MG tablet Commonly known as:  NORCO/VICODIN   ibuprofen 200 MG tablet Commonly known as:  ADVIL     TAKE these medications   acetaminophen 325 MG tablet Commonly known as:  TYLENOL Take 2 tablets (650 mg total) by mouth every 6 (six) hours as needed for mild pain (or Fever >/= 101).   aMILoride 5 MG tablet Commonly known as:  MIDAMOR Take 1 tablet (5 mg total) by mouth daily.   atorvastatin 20 MG tablet Commonly known as:  LIPITOR Take 20 mg by mouth daily.   busPIRone 15 MG tablet Commonly known as:  BUSPAR Take 15 mg by mouth 2 (two) times daily.   cefTRIAXone  IVPB Commonly known as:  ROCEPHIN Inject 2 g into the vein daily for 28 days. Indication:  MRSA, enterococcus, enterobacter and bacteroides Last Day of Therapy:  04/06/2019 Labs weekly on Monday  while on IV antibiotics: _X_ CBC with differential _X_ CMP X vanco trough  Labs every Thursday while on antibiotics: _x_BMP _x_VAnco trough  Labs once every 2 weeks on Monday- ESR/CRP   [employer] Senna 8.6 MG tablet Generic drug:  senna Take 1 tablet by mouth 2 (two) times daily.   dimenhyDRINATE 50 MG tablet Commonly known as:  DRAMAMINE Take 50 mg by mouth 2 (two) times daily as needed for itching or dizziness.   docusate sodium 100 MG capsule Commonly known as:  COLACE Take 1 capsule (100 mg total) by mouth 2 (two) times daily as needed for mild constipation.   famotidine 20 MG tablet Commonly known as:  PEPCID Take 20 mg by mouth 2 (two) times daily.   gabapentin 300 MG capsule Commonly known as:   NEURONTIN Take 600 mg by mouth at bedtime.   insulin aspart 100 UNIT/ML injection Commonly known as:  novoLOG Inject into the skin 3 (three) times daily before meals. Via pump   insulin pump Soln Inject 2.5 each into the skin every hour.   Linzess 72 MCG capsule Generic drug:  linaclotide Take 72 mcg by mouth daily as needed (constipation).   lisinopril 5 MG tablet Commonly known as:  ZESTRIL Take 5 mg by mouth daily.   metoCLOPramide 10 MG tablet Commonly known as:  REGLAN Take 1 tablet (10 mg total) by mouth every 6 (six) hours. What changed:  when to take this   metroNIDAZOLE 500 MG tablet Commonly known as:  FLAGYL Take 1 tablet (500 mg total) by mouth 3 (three) times daily for 14 days.   mupirocin ointment 2 % Commonly known as:  BACTROBAN Place 1 application into the nose 2 (two) times daily.   ondansetron 8 MG tablet Commonly known as:  ZOFRAN Take 8 mg by mouth every 6 (six) hours as needed for nausea/vomiting.   oxyCODONE 5 MG immediate release tablet Commonly known as:  Oxy IR/ROXICODONE Take 1 tablet (5 mg total) by mouth every 6 (six) hours as needed for moderate pain or severe pain.   pantoprazole 40 MG tablet Commonly known as:  PROTONIX Take 40 mg by mouth daily.   sertraline 100 MG tablet Commonly known as:  ZOLOFT Take 100 mg by mouth daily.   sildenafil 20 MG tablet Commonly known as:  REVATIO 1-5 pills as needed for ED symptoms What changed:    how much to take  how to take this  when to take this  reasons to take this  additional instructions   tiZANidine 4 MG tablet Commonly known as:  ZANAFLEX Take 1 tablet (4 mg total) by mouth every 6 (six) hours as needed for muscle spasms.   vancomycin  IVPB Inject 2,500 mg into the vein daily for 28 days. Indication: MRSA, enterococcus, enterobacter and bacteroides  Last Day of Therapy:  04/06/2019 Labs - Monday:  CBC/D, BMP, and vancomycin trough. Labs - Thursday:  BMP and vancomycin  trough Labs - Every other week:  ESR and CRP   vitamin B-12 1000 MCG tablet Commonly known as:  CYANOCOBALAMIN Take 1,000 mcg by mouth daily.            Home Infusion Instuctions  (From admission, onward)         Start     Ordered   03/09/19 0000  Home infusion instructions [facility] May follow [place] Dosing Protocol; May administer Cathflo as needed to maintain patency of vascular access device.; Flushing of vascular access device: per [facility] Protocol: 0.9% NaCl pre/post medica...    Question Answer Comment  Instructions May follow [place] Dosing Protocol   Instructions May administer Cathflo as needed to maintain patency of vascular access device.   Instructions Flushing of vascular  access device: per [facility] Protocol: 0.9% NaCl pre/post medication administration and prn patency; Heparin 100 u/ml, 43m for implanted ports and Heparin 10u/ml, 547mfor all other central venous catheters.   Instructions May follow [facility] Anaphylaxis Protocol for First Dose Administration in the home: 0.9% NaCl at 25-50 ml/hr to maintain IV access for protocol meds. Epinephrine 0.3 ml IV/IM PRN and Benadryl 25-50 IV/IM PRN s/s of anaphylaxis.   Instructions [facility] Infusion Coordinator (RN) to assist per patient IV care needs in the home PRN.      03/09/19 1404           Durable Medical Equipment  (From admission, onward)         Start     Ordered   03/09/19 1215  For home use only DME Walker rolling  Once    Comments:  Rolling walker with 5 inch wheels  Question:  Patient needs a walker to treat with the following condition  Answer:  Periostitis without osteomyelitis, of the ankle and foot [place]  03/09/19 1214   03/09/19 1102  For home use only DME Walker rolling  Once    Question:  Patient needs a walker to treat with the following condition  Answer:  Need for assistance due to unsteady gait   03/09/19 1102           DISCHARGE INSTRUCTIONS:  Follow-up with  primary care physician in 3 days Follow-up with infectious disease Dr. RaLevester Freshn 10 days.  CBC and CMP Weekly labs fax results to Dr. RaLevester Freshollow-up with Dr. TrElvina Mattesodiatry in a week Follow-up with primary endocrinologist as soon as possible in 3 days  DIET:  Diabetic diet  DISCHARGE CONDITION:  Stable  ACTIVITY:  Activity as tolerated per PT and Dr. TrElvina MattesOXYGEN:  Home Oxygen: No.   Oxygen Delivery: room air  DISCHARGE LOCATION:  home   If you experience worsening of your admission symptoms, develop shortness of breath, life threatening emergency, suicidal or homicidal thoughts you must seek medical attention immediately by calling 911 or calling your MD immediately  if symptoms less severe.  You Must read complete instructions/literature along with all the possible adverse reactions/side effects for all the Medicines you take and that have been prescribed to you. Take any new Medicines after you have completely understood and accpet all the possible adverse reactions/side effects.   Please note  You were cared for by a hospitalist during your hospital stay. If you have any questions about your discharge medications or the care you received while you were in the hospital after you are discharged, you can call the unit and asked to speak with the hospitalist on call if the hospitalist that took care of you is not available. Once you are discharged, your primary care physician will handle any further medical issues. Please note that NO REFILLS for any discharge medications will be authorized once you are discharged, as it is imperative that you return to your primary care physician (or establish a relationship with a primary care physician if you do not have one) for your aftercare needs so that they can reassess your need for medications and monitor your lab values.     Today  No chief complaint on file.  Patient is feeling better.  No complaints.  Wants to go  home.  Okay to discharge patient from podiatry and ID standpoint and will provide dressing supplies  ROS:  CONSTITUTIONAL: Denies fevers, chills. Denies any fatigue,  weakness.  EYES: Denies blurry vision, double vision, eye pain. EARS, NOSE, THROAT: Denies tinnitus, ear pain, hearing loss. RESPIRATORY: Denies cough, wheeze, shortness of breath.  CARDIOVASCULAR: Denies chest pain, palpitations, edema.  GASTROINTESTINAL: Denies nausea, vomiting, diarrhea, abdominal pain. Denies bright red blood per rectum. GENITOURINARY: Denies dysuria, hematuria. ENDOCRINE: Denies nocturia or thyroid problems. HEMATOLOGIC AND LYMPHATIC: Denies easy bruising or bleeding. SKIN: Denies rash or lesion.  Right foot with clean dressing status post fourth ray amputation MUSCULOSKELETAL: Denies pain in neck, back, shoulder, knees, hips or arthritic symptoms.  NEUROLOGIC: Denies paralysis, paresthesias.  PSYCHIATRIC: Denies anxiety or depressive symptoms.   VITAL SIGNS:  Blood pressure 109/69, pulse (!) 55, temperature 97.8 F (36.6 C), temperature source Oral, resp. rate 20, height 5' 10"  (1.778 m), weight 97.1 kg, SpO2 99 %.  I/O:    Intake/Output Summary (Last 24 hours) at 03/10/2019 1131 Last data filed at 03/10/2019 0900 Gross per 24 hour  Intake 2765.52 ml  Output -  Net 2765.52 ml    PHYSICAL EXAMINATION:  GENERAL:  43 y.o.-year-old patient lying in the bed with no acute distress.  EYES: Pupils equal, round, reactive to light and accommodation. No scleral icterus. Extraocular muscles intact.  HEENT: Head atraumatic, normocephalic. Oropharynx and nasopharynx clear.  NECK:  Supple, no jugular venous distention. No thyroid enlargement, no tenderness.  LUNGS: Normal breath sounds bilaterally, no wheezing, rales,rhonchi or crepitation. No use of accessory muscles of respiration.  CARDIOVASCULAR: S1, S2 normal. No murmurs, rubs, or gallops.  ABDOMEN: Soft, non-tender, non-distended. Bowel sounds present.   EXTREMITIES: No pedal edema, cyanosis, or clubbing.  NEUROLOGIC: Awake alert and oriented x3 sensation intact. Gait not checked.  PSYCHIATRIC: The patient is alert and oriented x 3.  SKIN: Right foot and clean dressing status post fourth ray amputation no obvious rash, lesion, or ulcer.   DATA REVIEW:   CBC Recent Labs  Lab 03/07/19 0502  WBC 11.9*  HGB 9.4*  HCT 29.5*  PLT 270    Chemistries  Recent Labs  Lab 03/09/19 0434  NA 138  K 4.8  CL 108  CO2 24  GLUCOSE 267*  BUN 25*  CREATININE 1.14  CALCIUM 9.0    Cardiac Enzymes No results for input(s): TROPONINI in the last 168 hours.  Microbiology Results  Results for orders placed or performed during the hospital encounter of 03/05/19  CULTURE, BLOOD (ROUTINE X 2) w Reflex to ID Panel     Status: None   Collection Time: 03/05/19  2:22 PM  Result Value Ref Range Status   Specimen Description BLOOD RIGHT ANTECUBITAL  Final   Special Requests   Final    BOTTLES DRAWN AEROBIC AND ANAEROBIC Blood Culture results may not be optimal due to an inadequate volume of blood received in culture bottles   Culture   Final    NO GROWTH 5 DAYS Performed at [facility], [address] [place], [place] [address]    Report Status 03/10/2019 FINAL  Final  CULTURE, BLOOD (ROUTINE X 2) w Reflex to ID Panel     Status: None   Collection Time: 03/05/19  2:22 PM  Result Value Ref Range Status   Specimen Description BLOOD LEFT ANTECUBITAL  Final   Special Requests   Final    BOTTLES DRAWN AEROBIC AND ANAEROBIC Blood Culture adequate volume   Culture   Final    NO GROWTH 5 DAYS Performed at [facility], [address] [place],  [address]    Report  Status 03/10/2019 FINAL  Final  Surgical pcr screen     Status: Abnormal   Collection Time: 03/05/19  6:48 PM  Result Value Ref Range Status   MRSA, PCR POSITIVE (A) NEGATIVE Final    Comment: RESULT CALLED TO, READ BACK BY AND VERIFIED WITH: Sherald Barge  RN 03/05/2019 @2045  RDW    Staphylococcus aureus POSITIVE (A) NEGATIVE Final    Comment: (NOTE) The Xpert SA Assay (FDA approved for NASAL specimens in patients 57 years of age and older), is one component of a comprehensive surveillance program. It is not intended to diagnose infection nor to guide or monitor treatment. Performed at [facility], [address] [place], [place] [address]   Aerobic/Anaerobic Culture (surgical/deep wound)     Status: None   Collection Time: 03/06/19  2:18 PM  Result Value Ref Range Status   Specimen Description   Final    BONE RIGHT FOOT Performed at [facility], [address], [place], [place] [address]    Special Requests   Final    NONE Performed at [facility], [address] [place], [place] [address]    Gram Stain   Final    ABUNDANT WBC PRESENT,BOTH PMN AND MONONUCLEAR ABUNDANT GRAM POSITIVE COCCI IN PAIRS ABUNDANT GRAM NEGATIVE RODS    Culture   Final    ABUNDANT ENTEROCOCCUS FAECALIS FEW METHICILLIN RESISTANT STAPHYLOCOCCUS AUREUS FEW ENTEROBACTER CLOACAE MODERATE STREPTOCOCCUS CONSTELLATUS ABUNDANT PREVOTELLA BIVIA BETA LACTAMASE NEGATIVE Performed at [facility], [place] [address] [place], [place] [address]    Report Status 03/09/2019 FINAL  Final   Organism ID, Bacteria ENTEROCOCCUS FAECALIS  Final   Organism ID, Bacteria METHICILLIN RESISTANT STAPHYLOCOCCUS AUREUS  Final   Organism ID, Bacteria ENTEROBACTER CLOACAE  Final   Organism ID, Bacteria STREPTOCOCCUS CONSTELLATUS  Final      Susceptibility   Streptococcus constellatus - MIC*    PENICILLIN 0.25 INTERMEDIATE Intermediate     CEFTRIAXONE 1 SENSITIVE Sensitive     ERYTHROMYCIN 4 RESISTANT Resistant     LEVOFLOXACIN 1 SENSITIVE Sensitive     VANCOMYCIN 0.5 SENSITIVE Sensitive     * MODERATE STREPTOCOCCUS CONSTELLATUS   Enterobacter cloacae - MIC*    CEFAZOLIN >=64 RESISTANT Resistant     CEFEPIME <=1 SENSITIVE Sensitive      CEFTAZIDIME <=1 SENSITIVE Sensitive     CEFTRIAXONE <=1 SENSITIVE Sensitive     CIPROFLOXACIN <=0.25 SENSITIVE Sensitive     GENTAMICIN <=1 SENSITIVE Sensitive     IMIPENEM <=0.25 SENSITIVE Sensitive     TRIMETH/SULFA <=20 SENSITIVE Sensitive     PIP/TAZO <=4 SENSITIVE Sensitive     * FEW ENTEROBACTER CLOACAE   Enterococcus faecalis - MIC*    AMPICILLIN <=2 SENSITIVE Sensitive     VANCOMYCIN 1 SENSITIVE Sensitive     GENTAMICIN SYNERGY SENSITIVE Sensitive     * ABUNDANT ENTEROCOCCUS FAECALIS   Methicillin resistant staphylococcus aureus - MIC*    CIPROFLOXACIN 2 INTERMEDIATE Intermediate     ERYTHROMYCIN >=8 RESISTANT Resistant     GENTAMICIN <=0.5 SENSITIVE Sensitive     OXACILLIN >=4 RESISTANT Resistant     TETRACYCLINE >=16 RESISTANT Resistant     VANCOMYCIN 1 SENSITIVE Sensitive     TRIMETH/SULFA <=10 SENSITIVE Sensitive     CLINDAMYCIN >=8 RESISTANT Resistant     RIFAMPIN 2 INTERMEDIATE Intermediate     Inducible Clindamycin NEGATIVE Sensitive     * FEW METHICILLIN RESISTANT STAPHYLOCOCCUS AUREUS    RADIOLOGY:  [place] Ekg Site  Rite  Result Date: 03/09/2019 If Site Rite image not attached, placement could not be confirmed due to current cardiac rhythm.   EKG:   Orders placed or performed during the hospital encounter of 10/20/17  . EKG 12-Lead  . EKG 12-Lead  . EKG      Management plans discussed with the patient, he is  in agreement.  CODE STATUS:     Code Status Orders  (From admission, onward)         Start     Ordered   03/05/19 1350  Full code  Continuous     03/05/19 1349        Code Status History    Date Active Date Inactive Code Status Order ID Comments User Context   11/25/2018 1606 11/30/2018 0010 Full Code [id number]  Saundra Shelling, MD Inpatient   10/21/2017 2123 10/22/2017 2032 Full Code [id number]  Bennie Pierini, MD ED   09/09/2017 2157 09/10/2017 1700 Full Code [id number]  Ashok Pall, MD Inpatient      TOTAL TIME TAKING CARE OF THIS  PATIENT: 45 minutes.   Note: This dictation was prepared with Dragon dictation along with smaller phrase technology. Any transcriptional errors that result from this process are unintentional.   @[facility] @  on 03/10/2019 at 11:31 AM  Between 7am to 6pm - Pager - [phone]  After 6pm go to [email or web address] - password [email or web address]  Office  [phone]  CC: Primary care physician; Tracie Harrier, MD

## 2019-03-10 NOTE — TOC Transition Note (Signed)
Transition of Care [facility]) - CM/SW Discharge Note   Patient Details  Name: Richard Mendoza MRN: [id number] Date of Birth: [birth year]  Transition of Care [facility]) CM/SW Contact:  Barrie Dunker, RN Phone Number: 03/10/2019, 11:42 AM   Clinical Narrative:     This patient is set up for IV ABX infusion and the dosing is already in the home and [facility] health will be coming out to the home to do the IV ABX teaching and infusion today at 4 PM, the charge nurse was made aware as the bedside nurse is at lunch,  The supplies should be ordered by the physician before DC and the patient stated that he did not need Home health for Wound care only needed supplies, I explained that he would have to pick up supplies form a Home health supply store and that he may have some out of pocket expense as I am not sure what Medicaid will cover for this, he agreed. He refuses Home health services except for the IV infusion Wife will provide transportation , he can afford his medications with Medicaid   Final next level of care: Home w Home Health Services(for IV ABX infusion only) Barriers to Discharge: Barriers Resolved   Patient Goals and CMS Choice        Discharge Placement                       Discharge Plan and Services                          Social Determinants of Health (SDOH) Interventions     Readmission Risk Interventions No flowsheet data found.

## 2019-03-10 NOTE — Discharge Instructions (Signed)
Follow-up with primary care physician in 3 days Follow-up with infectious disease Dr. Rudene Anda in 10 days Follow-up with Dr. Orland Jarred podiatry in a week Follow-up with primary endocrinologist as soon as possible in 3 days

## 2019-03-10 NOTE — Progress Notes (Signed)
Inpatient Diabetes Program Recommendations  AACE/ADA: New Consensus Statement on Inpatient Glycemic Control (2015)  Target Ranges:  Prepandial:   less than 140 mg/dL      Peak postprandial:   less than 180 mg/dL (1-2 hours)      Critically ill patients:  140 - 180 mg/dL  Results for Richard Mendoza, Richard Mendoza (MRN [id number]) as of 03/10/2019 07:30  Ref. Range 03/09/2019 04:34  Glucose Latest Ref Range: 70 - 99 mg/dL 184 (H)   Results for Richard Mendoza, Richard Mendoza (MRN [id number]) as of 03/10/2019 07:30  Ref. Range 03/09/2019 07:44 03/09/2019 11:47 03/09/2019 16:54 03/09/2019 21:24  Glucose-Capillary Latest Ref Range: 70 - 99 mg/dL 770 (H) 340 (H) 352 (H) 318 (H)   Review of Glycemic Control  Diabetes history: DM2 Outpatient Diabetes medications: 630G Medtronic insulin pump with Novolog Current orders for Inpatient glycemic control: Insulin Pump Q4H  Inpatient Diabetes Program Recommendations:   Insulin Pump: Even though CBGs and Insulin Pump frequency is ordered Q4H, patient is refusing CBG and insulin pump correction during the night (12 midnight and 4 am).   Insulin - Basal: If patient will remain inpatient today, recommend ordering one time Lantus 10 units x1 now (in addition to his insulin pump). Then continue to follow glycemic trends daily to determine if additional Lantus is needed each day while inpatient.  NOTE: Hyperglycemia has been consistent and patient needs to have adjustments made with an Endocrinologist. Per chart, patient has not seen his Endocrinologist since 09/16/18.   Inpatient Diabetes Coordinator spoke with patient on 03/06/19 and he refused to remove his insulin pump but was agreeable to CBGs and insulin pump correction Q4H. However, per chart, patient has been refusing CBGs and to do a correction via insulin pump during the night.  Patient was encouraged to see if he has an upcoming appointment scheduled and if not encouraged him to make an appointment for follow up as he will likely need  insulin pump setting adjustments. Patient had stated it may be difficult to get an appointment due to restrictions with COVID. Encouraged patient to ask about a video visit or if he could give them his glucose readings and they could advise him on insulin pump settings that need to be made. Patient definitely needs to see Endocrinologist as soon as possible and have insulin pump adjustments made.  Thanks, Orlando Penner, RN, MSN, CDE Diabetes Coordinator Inpatient Diabetes Program [phone] (Team Pager from 8am to 5pm)

## 2019-03-10 NOTE — TOC Progression Note (Signed)
Transition of Care [facility]) - Progression Note    Patient Details  Name: Richard Mendoza MRN: [id number] Date of Birth: 18-Apr-1976  [facility] of [facility]) CM/SW Contact  Barrie Dunker, RN Phone Number: 03/10/2019, 9:44 AM  Clinical Narrative:    Spoke with the patient via telephone as he is on Contact precaution, he stated that he is fine with having the Home health nurse to do the PICC line dressing change, His IV ABX is set up and they have spoken with the family and the patient, he and his wife will do the wound care as long as he has an RX for the supplies, I explained he will not be able to get those thru a pharmacy that he would have to go to a home DME supply stor, he agreed, He declined HH services other than the [facility] nurse for the IV infusion and PICC line dressing change,  The patient is to discharge home today        Expected Discharge Plan and Services                                     Social Determinants of Health (SDOH) Interventions    Readmission Risk Interventions No flowsheet data found.

## 2019-03-17 ENCOUNTER — Encounter: Payer: Self-pay | Admitting: Infectious Diseases

## 2019-03-17 ENCOUNTER — Other Ambulatory Visit: Payer: Self-pay | Admitting: Infectious Diseases

## 2019-03-17 ENCOUNTER — Telehealth: Payer: Self-pay | Admitting: Licensed Clinical Social Worker

## 2019-03-17 MED ORDER — SODIUM CHLORIDE 0.9 % IV SOLN: 780.0000 mg | Freq: Once | INTRAVENOUS | Status: DC

## 2019-03-17 NOTE — Progress Notes (Signed)
Needs first dose of daptomycin

## 2019-03-17 NOTE — Progress Notes (Signed)
Received call from [place] [facility]. His cr is slowly creeping up from 1.1-> 1.2 -> 1.3   Larita Fife was concerned about vanco dosing at 2500 mg. Discussed change to daptomycin 8 mg/kg. Check baseline CPK and then weekly

## 2019-03-17 NOTE — Telephone Encounter (Signed)
Larita Fife called from [facility] stating that the patient had an increase in creatinine and and an inability to reach vancomycin trough goal. Patient is being switched to Daptomycin 780 mg. Patient will go to same day surgery tomorrow at 9:00am to receive first dose. Patient is aware changes and appointment.

## 2019-03-18 ENCOUNTER — Other Ambulatory Visit: Payer: Self-pay

## 2019-03-18 ENCOUNTER — Ambulatory Visit
Admission: RE | Admit: 2019-03-18 | Discharge: 2019-03-18 | Disposition: A | Discharge status: Home / Self Care | Payer: Medicaid Other | Source: Ambulatory Visit | Attending: Internal Medicine | Admitting: Internal Medicine

## 2019-03-18 DIAGNOSIS — S91309A Unspecified open wound, unspecified foot, initial encounter: Secondary | ICD-10-CM | POA: Diagnosis not present

## 2019-03-18 MED ORDER — HEPARIN SOD (PORK) LOCK FLUSH 100 UNIT/ML IV SOLN
INTRAVENOUS | Status: AC
  Administered 2019-03-18: 250 [IU]
  Filled 2019-03-18: qty 5

## 2019-03-18 MED ORDER — SODIUM CHLORIDE 0.9 % IV SOLN
780.0000 mg | Freq: Once | INTRAVENOUS | Status: AC
  Administered 2019-03-18: 780 mg via INTRAVENOUS
  Filled 2019-03-18: qty 15.6

## 2019-03-18 MED ORDER — HEPARIN SOD (PORK) LOCK FLUSH 100 UNIT/ML IV SOLN
250.0000 [IU] | INTRAVENOUS | Status: AC | PRN
  Administered 2019-03-18: 11:00:00 250 [IU]

## 2019-03-18 MED ORDER — SODIUM CHLORIDE FLUSH 0.9 % IV SOLN
INTRAVENOUS | Status: AC
  Administered 2019-03-18: 11:00:00
  Filled 2019-03-18: qty 10

## 2019-03-26 ENCOUNTER — Other Ambulatory Visit: Payer: Self-pay

## 2019-03-26 ENCOUNTER — Ambulatory Visit: Payer: Medicaid Other | Attending: Infectious Diseases | Admitting: Infectious Diseases

## 2019-03-26 DIAGNOSIS — Z794 Long term (current) use of insulin: Secondary | ICD-10-CM

## 2019-03-26 DIAGNOSIS — M869 Osteomyelitis, unspecified: Secondary | ICD-10-CM

## 2019-03-26 DIAGNOSIS — B952 Enterococcus as the cause of diseases classified elsewhere: Secondary | ICD-10-CM | POA: Diagnosis not present

## 2019-03-26 DIAGNOSIS — E1169 Type 2 diabetes mellitus with other specified complication: Secondary | ICD-10-CM

## 2019-03-26 DIAGNOSIS — E11628 Type 2 diabetes mellitus with other skin complications: Secondary | ICD-10-CM | POA: Diagnosis not present

## 2019-03-26 DIAGNOSIS — B9562 Methicillin resistant Staphylococcus aureus infection as the cause of diseases classified elsewhere: Secondary | ICD-10-CM | POA: Diagnosis not present

## 2019-03-26 DIAGNOSIS — L089 Local infection of the skin and subcutaneous tissue, unspecified: Secondary | ICD-10-CM | POA: Diagnosis not present

## 2019-03-26 DIAGNOSIS — B9689 Other specified bacterial agents as the cause of diseases classified elsewhere: Secondary | ICD-10-CM

## 2019-03-26 DIAGNOSIS — F17210 Nicotine dependence, cigarettes, uncomplicated: Secondary | ICD-10-CM

## 2019-03-26 DIAGNOSIS — Z89421 Acquired absence of other right toe(s): Secondary | ICD-10-CM

## 2019-03-26 NOTE — Progress Notes (Signed)
NAME: Richard Mendoza  DOB: Mar 03, 1976  MRN: [id number]  Date/Time: 03/26/2019 11:54 AM  Virtual visit after recent hospitalization The purpose of this virtual visit is to provide medical care while limiting exposure to the novel coronavirus (COVID19) for both patient and office staff.   Consent was obtained for video visit:  Yes.   Answered questions that patient had about telehealth interaction:  Yes.   I discussed the limitations, risks, security and privacy concerns of performing an evaluation and management service by telephone. I also discussed with the patient that there may be a patient responsible charge related to this service. The patient expressed understanding and agreed to proceed.   Patient Location: Home- wife present during the visit Provider Location:office ? Kay Ricciuti is a 43 y.o. 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 He was discharged home on IV vanco, IV ceftriaoxne until 5/18 and PO flagyl until 03/23/19. Pt had worsening cr and could never attain vanco therapeutic level and it was changed o Daptomycin on 4/29. Pt says since he started dapto he developed an itch 2 days after. It is mild and there is no rash. He does not take any meds for it. The foot is coming along well. He saw Dr.Fowler as OP after discharge and his next appt is on 5/12. He has no fever , HE has some losse stools, no cough or SOB, he is resting 50% of the time His wife helps with dressing change  In Jan he was in [facility] between 11/25/18-11/29/18 for rt foot infection- Had an abscess on the plantar aspect which was I/D by Dr.Fowler and it was MRSA . He was sent home on vancomycin for 2 weeks following which he  took bactrim + rifampin for decolonization. His wife and children also had MRSA and they were told to go their PCP and decoloniz  Past Medical History:  Diagnosis Date  . DIABETES MELLITUS, TYPE II, UNCONTROLLED 03/17/2009  . DM 12/08/2008  . HYPERLIPIDEMIA 03/17/2009  . HYPERTENSION 12/08/2008  . 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 ANTERIOR CERVICAL DECOMP/DISCECTOMY FUSION N/A 09/09/2017   Procedure: ANTERIOR CERVICAL DECOMPRESSION/DISCECTOMY FUSION CERVICAL 6- CERVICAL 7;  Surgeon: Ashok Pall, MD;  Location: [place];  Service: Neurosurgery;  Laterality: N/A;  ANTERIOR CERVICAL DECOMPRESSION/DISCECTOMY FUSION CERVICAL 6- CERVICAL 7  . APPENDECTOMY    . I&D EXTREMITY Right 10/03/2017   Procedure: IRRIGATION AND DEBRIDEMENT RIGHT WRIST;  Surgeon: Leanora Cover, MD;  Location: [place];  Service: Orthopedics;  Laterality: Right;  . I&D EXTREMITY Right 11/26/2018   Procedure: IRRIGATION AND DEBRIDEMENT FASCIA ON RIGHT FOOT;  Surgeon: Samara Deist, DPM;  Location: [facility];  Service: Podiatry;  Laterality: Right;  . 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 osteomylitis    . ROTATOR CUFF REPAIR Left     Social History   Socioeconomic History  . Marital status: Married    Spouse name: Not on file  . Number of children: Not on file  . Years of education: Not on file  . Highest education  level: Not on file  Occupational History  . Not on file  Social Needs  . Financial resource strain: Not on file  . Food insecurity:    Worry: Not on file    Inability: Not on file  . Transportation needs:    Medical: Not on file    Non-medical: Not on file  Tobacco Use  . Smoking status: Current Every Day Smoker    Packs/day: 1.00    Years: 17.00    Pack years: 17.00    Types: Cigarettes  .  Smokeless tobacco: Never Used  . Tobacco comment: currently smoking 1 ppd.    Substance and Sexual Activity  . Alcohol use: Yes    Frequency: Never    Comment: rare  . Drug use: No  . Sexual activity: Yes  Lifestyle  . Physical activity:    Days per week: Not on file    Minutes per session: Not on file  . Stress: Not on file  Relationships  . Social connections:    Talks on phone: Not on file    Gets together: Not on file    Attends religious service: Not on file    Active member of club or organization: Not on file    Attends meetings of clubs or organizations: Not on file    Relationship status: Not on file  . Intimate partner violence:    Fear of current or ex partner: Not on file    Emotionally abused: Not on file    Physically abused: Not on file    Forced sexual activity: Not on file  Other Topics Concern  . Not on file  Social History Narrative  . Not on file    Family History  Problem Relation Age of Onset  . Diabetes Mother   . Heart disease Father   . Diabetes Father   . Arthritis Other   . Hyperlipidemia Other   . Hypertension Other   . Cancer Other        breast   No Known Allergies  ? Current Outpatient Medications Jardiance  Medication Sig Dispense Refill  . acetaminophen (TYLENOL) 325 MG tablet Take 2 tablets (650 mg total) by mouth every 6 (six) hours as needed for mild pain (or Fever >/= 101).    Marland Kitchen atorvastatin (LIPITOR) 20 MG tablet Take 20 mg by mouth daily.  3  . busPIRone (BUSPAR) 15 MG tablet Take 15 mg by mouth 2 (two) times daily.    . cefTRIAXone (ROCEPHIN) IVPB Inject 2 g into the vein daily for 28 days. Indication:  MRSA, enterococcus, enterobacter and bacteroides Last Day of Therapy:  04/06/2019 Labs weekly on Monday  while on IV antibiotics: _X_ CBC with differential _X_ CMP X vanco trough  Labs every Thursday while on antibiotics: _x_BMP _x_VAnco trough  Labs once every 2 weeks on Monday- ESR/CRP 28 Units 0  . [employer] SENNA 8.6  MG tablet Take 1 tablet by mouth 2 (two) times daily.   5  . dimenhyDRINATE (DRAMAMINE) 50 MG tablet Take 50 mg by mouth 2 (two) times daily as needed for itching or dizziness.     . docusate sodium (COLACE) 100 MG capsule Take 1 capsule (100 mg total) by mouth 2 (two) times daily as needed for mild constipation. 10 capsule 0  . famotidine (PEPCID) 20 MG tablet Take 20 mg by mouth 2 (two) times daily.    Marland Kitchen gabapentin (NEURONTIN) 300 MG capsule Take 600 mg by mouth at bedtime.     Marland Kitchen  insulin aspart (NOVOLOG) 100 UNIT/ML injection Inject into the skin 3 (three) times daily before meals. Via pump    . Insulin Human (INSULIN PUMP) SOLN Inject 2.5 each into the skin every hour.    . linaclotide (LINZESS) 72 MCG capsule Take 72 mcg by mouth daily as needed (constipation).    Marland Kitchen lisinopril (PRINIVIL,ZESTRIL) 5 MG tablet Take 5 mg by mouth daily.  5  . metoCLOPramide (REGLAN) 10 MG tablet Take 1 tablet (10 mg total) by mouth every 6 (six) hours. (Patient taking differently: Take 10 mg by mouth 2 (two) times daily. ) 30 tablet 0  . mupirocin ointment (BACTROBAN) 2 % Place 1 application into the nose 2 (two) times daily. 22 g 0  . ondansetron (ZOFRAN) 8 MG tablet Take 8 mg by mouth every 6 (six) hours as needed for nausea/vomiting.    Marland Kitchen oxyCODONE (OXY IR/ROXICODONE) 5 MG immediate release tablet Take 1 tablet (5 mg total) by mouth every 6 (six) hours as needed for moderate pain or severe pain. 30 tablet 0  . pantoprazole (PROTONIX) 40 MG tablet Take 40 mg by mouth daily.  5  . sertraline (ZOLOFT) 100 MG tablet Take 100 mg by mouth daily.   5  . sildenafil (REVATIO) 20 MG tablet 1-5 pills as needed for ED symptoms (Patient taking differently: Take 20-100 mg by mouth daily as needed (ED). ) 30 tablet 0  . tiZANidine (ZANAFLEX) 4 MG tablet Take 1 tablet (4 mg total) by mouth every 6 (six) hours as needed for muscle spasms. 60 tablet 0  .    0  . vitamin B-12 (CYANOCOBALAMIN) 1000 MCG tablet Take 1,000 mcg by  mouth daily.     Current Facility-Administered Medications  Medication Dose Route Frequency Provider Last Rate Last Dose  . DAPTOmycin (CUBICIN) 780 mg in sodium chloride 0.9 % IVPB  780 mg Intravenous Once Leonel Ramsay, MD         Abtx:  Anti-infectives (From admission, onward)   None      REVIEW OF SYSTEMS:  Const: negative fever, negative chills, negative weight loss Eyes: negative diplopia or visual changes, negative eye pain ENT: negative coryza, negative sore throat Resp: negative cough, hemoptysis, dyspnea Cards: negative for chest pain, palpitations, lower extremity edema GU: negative for frequency, dysuria and hematuria GI: Negative for abdominal pain, diarrhea, bleeding, constipation Skin: pruritus Heme: negative for easy bruising and gum/nose bleeding MS: negative for myalgias, arthralgias, back pain and muscle weakness Neurolo:negative for headaches, dizziness, vertigo, memory problems  Psych: negative for feelings of anxiety, depression  Endocrine: says blood sugar is better controlled and his endocrinologist added Jardiance Allergy/Immunology- negative for any medication or food allergies ?  Objective:  VITALS:  There were no vitals taken for this visit. PHYSICAL EXAM:  General: Alert, cooperative, no distress, appears stated age.  Extremities: rt foot  The surgical wound is healing well, no erythema of the foot A small ulcer on the lateral aspect -clean and surrounded by dry scales         Pertinent Labs Lab Results CBC 03/16/19 cr 1.27 HB 10.5 WBC 9.2 PLT 304   ? Impression/Recommendation ? ?Diabetic foot infection Rt with polymicrobial infection- also had osteomyelitis of the 4th toe- s/p 4th ray excision and I/D- was discharged on VAnco and ceftriaxone to complete 4 weeks until 04/06/19. Also received 2 weeks of PO flagyl MRSA foot infection  Increasing Cr - vanco was changed to Dapto on 4/29  Pruritus X 5 days  but no rash- will  check eosinophil levels, if high will have to DC dapto  DM- on insulin, Jardiance  Will complete IV on 5/18 - may decide after he sees Dr.Fowler on 5/12 whether he will need PO antibiotic after completion of IV ? ___________________________________________________ Discussed with patient and his wife.

## 2019-04-07 ENCOUNTER — Ambulatory Visit: Payer: Medicaid Other | Attending: Infectious Diseases | Admitting: Infectious Diseases

## 2019-04-07 ENCOUNTER — Other Ambulatory Visit: Payer: Self-pay

## 2019-04-07 ENCOUNTER — Encounter: Payer: Self-pay | Admitting: Infectious Diseases

## 2019-04-07 VITALS — BP 135/90 | HR 92 | Temp 98.3°F | Ht 70.0 in | Wt 211.0 lb

## 2019-04-07 DIAGNOSIS — B9689 Other specified bacterial agents as the cause of diseases classified elsewhere: Secondary | ICD-10-CM

## 2019-04-07 DIAGNOSIS — B952 Enterococcus as the cause of diseases classified elsewhere: Secondary | ICD-10-CM | POA: Diagnosis not present

## 2019-04-07 DIAGNOSIS — E11628 Type 2 diabetes mellitus with other skin complications: Secondary | ICD-10-CM | POA: Diagnosis not present

## 2019-04-07 DIAGNOSIS — Z95828 Presence of other vascular implants and grafts: Secondary | ICD-10-CM

## 2019-04-07 DIAGNOSIS — B9562 Methicillin resistant Staphylococcus aureus infection as the cause of diseases classified elsewhere: Secondary | ICD-10-CM | POA: Diagnosis not present

## 2019-04-07 DIAGNOSIS — E1169 Type 2 diabetes mellitus with other specified complication: Secondary | ICD-10-CM

## 2019-04-07 DIAGNOSIS — Z89421 Acquired absence of other right toe(s): Secondary | ICD-10-CM

## 2019-04-07 DIAGNOSIS — L089 Local infection of the skin and subcutaneous tissue, unspecified: Secondary | ICD-10-CM | POA: Diagnosis not present

## 2019-04-07 DIAGNOSIS — F17210 Nicotine dependence, cigarettes, uncomplicated: Secondary | ICD-10-CM

## 2019-04-07 DIAGNOSIS — M869 Osteomyelitis, unspecified: Secondary | ICD-10-CM

## 2019-04-07 DIAGNOSIS — Z794 Long term (current) use of insulin: Secondary | ICD-10-CM

## 2019-04-07 MED ORDER — AMOXICILLIN-POT CLAVULANATE 875-125 MG PO TABS: 1.0000 | ORAL_TABLET | Freq: Two times a day (BID) | ORAL | 0 refills | Status: DC

## 2019-04-07 MED ORDER — SULFAMETHOXAZOLE-TRIMETHOPRIM 800-160 MG PO TABS: 1.0000 | ORAL_TABLET | Freq: Two times a day (BID) | ORAL | 0 refills | Status: DC

## 2019-04-07 NOTE — Patient Instructions (Addendum)
You are here for follow up visit of the foot infection which has healed well except for a small pressure ulcer on the lateral aspect of the rt foot and a small opening on the plantar side You have completed IV antibiotics ( 5 weeks) and the picc can be removed- Will do Bactrim + augmentin for 2 more weeks until you see Dr.Fowler- Also given calcium alginate dressing- can do siler dressing as well- Please check with Dr.Fowler when you see him

## 2019-04-07 NOTE — Progress Notes (Signed)
NAME: Richard Mendoza  DOB: 07/10/1976  MRN: [id number]  Date/Time: 04/07/2019 10:34 AM  Follow up visit for rt foot infection for which he is on IV antibioitcs  Richard Mendoza is a 43 y.o. 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 He was discharged home on IV vanco, IV ceftriaoxne until 5/18 and PO flagyl until 03/23/19. Pt had worsening cr and could never attain vanco therapeutic level and it was changed o Daptomycin on 4/29.  HE has been doing well and has completed 33  days of  IV antibiotic on 04/06/19 The wound has almost closed completely. He has been followed by Dr.Fowler as OP  Medical History  In Jan he was in [facility] between 11/25/18-11/29/18 for rt foot infection- Had an abscess says on the plantar aspect which was I/D by Dr.Fowler and it was MRSA . He was sent home on vancomycin for 2 weeksfollowing which he took bactrim + rifampin for decolonization. His wife and children also had MRSA and they were told to go their PCP and decoloniz Past Medical History:  Diagnosis Date  . DIABETES MELLITUS, TYPE II, UNCONTROLLED 03/17/2009  . DM 12/08/2008  . HYPERLIPIDEMIA 03/17/2009  . HYPERTENSION 12/08/2008  . 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 ANTERIOR CERVICAL DECOMP/DISCECTOMY FUSION N/A 09/09/2017   Procedure: ANTERIOR CERVICAL DECOMPRESSION/DISCECTOMY FUSION CERVICAL 6- CERVICAL 7;  Surgeon: Ashok Pall, MD;  Location: [place];  Service: Neurosurgery;  Laterality: N/A;  ANTERIOR CERVICAL DECOMPRESSION/DISCECTOMY FUSION CERVICAL 6- CERVICAL 7  . APPENDECTOMY    . I&D EXTREMITY Right 10/03/2017   Procedure: IRRIGATION AND DEBRIDEMENT RIGHT  WRIST;  Surgeon: Leanora Cover, MD;  Location: [place];  Service: Orthopedics;  Laterality: Right;  . I&D EXTREMITY Right 11/26/2018   Procedure: IRRIGATION AND DEBRIDEMENT FASCIA ON RIGHT FOOT;  Surgeon: Samara Deist, DPM;  Location: [facility];  Service: Podiatry;  Laterality: Right;  . 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 osteomylitis    . ROTATOR CUFF REPAIR Left     Social History   Socioeconomic History  . Marital status: Married    Spouse name: Not on file  . Number of children: Not on file  . Years of education: Not on file  . Highest education level: Not on file  Occupational History  . Not on file  Social Needs  . Financial resource strain: Not on file  . Food insecurity:    Worry: Not on file    Inability: Not on file  . Transportation needs:    Medical: Not on file    Non-medical: Not on file  Tobacco Use  . Smoking status: Current Every Day Smoker    Packs/day: 1.00    Years: 17.00    Pack years: 17.00    Types: Cigarettes  . Smokeless tobacco: Never Used  . Tobacco comment: currently smoking 1 ppd.    Substance and Sexual Activity  . Alcohol use: Yes    Frequency: Never    Comment: rare  . Drug use: No  . Sexual activity: Yes  Lifestyle  . Physical activity:    Days per week: Not on file    Minutes per session: Not on file  . Stress: Not on file  Relationships  . Social connections:    Talks on phone: Not on file    Gets together: Not on file    Attends religious service: Not on file    Active member of club or organization: Not on file    Attends meetings of clubs or organizations: Not on file    Relationship status: Not on file  . Intimate partner violence:    Fear of current or ex partner: Not on file    Emotionally abused: Not on file    Physically abused: Not on file    Forced  sexual activity: Not on file  Other Topics Concern  . Not on file  Social History Narrative  . Not on file    Family History  Problem Relation Age of Onset  . Diabetes Mother   . Heart disease Father   . Diabetes Father   . Arthritis Other   . Hyperlipidemia Other   . Hypertension Other   . Cancer Other        breast   No Known Allergies  ? Current Outpatient Medications  Medication Sig Dispense Refill  . acetaminophen (TYLENOL) 325 MG tablet Take 2 tablets (650 mg total) by mouth every 6 (six) hours as needed for mild pain (or Fever >/= 101).    Marland Kitchen aMILoride (MIDAMOR) 5 MG tablet Take 1 tablet (5 mg total) by mouth daily. 30 tablet 0  . atorvastatin (LIPITOR) 20 MG tablet Take 20 mg by mouth daily.  3  . busPIRone (BUSPAR) 15 MG tablet Take 15 mg by mouth 2 (two) times daily.    . cefTRIAXone (ROCEPHIN) IVPB Inject 2 g into the vein daily for 28 days. Indication:  MRSA, enterococcus, enterobacter and bacteroides Last Day of Therapy:  04/06/2019 Labs weekly on Monday  while on IV antibiotics: _X_ CBC with differential _X_ CMP X vanco trough  Labs every Thursday while on antibiotics: _x_BMP _x_VAnco trough  Labs once every 2 weeks on Monday- ESR/CRP 28 Units 0  . [employer] SENNA 8.6 MG tablet Take 1 tablet by mouth 2 (two) times daily.   5  . dimenhyDRINATE (DRAMAMINE) 50 MG tablet Take 50 mg by mouth 2 (two) times daily as needed for itching or dizziness.     . docusate sodium (COLACE) 100 MG capsule Take 1 capsule (100 mg total) by mouth 2 (two) times daily as needed for mild constipation. 10 capsule 0  . famotidine (PEPCID) 20 MG tablet Take 20 mg by mouth 2 (two) times daily.    Marland Kitchen gabapentin (NEURONTIN) 300 MG capsule Take 600 mg by mouth at bedtime.     . insulin aspart (NOVOLOG) 100 UNIT/ML injection Inject into the skin 3 (three) times daily before meals. Via pump    . Insulin Human (INSULIN PUMP) SOLN Inject 2.5 each into the skin every hour.    . linaclotide (LINZESS)  72 MCG capsule Take 72 mcg by mouth daily as needed (constipation).    Marland Kitchen lisinopril (PRINIVIL,ZESTRIL) 5 MG tablet Take 5 mg by mouth daily.  5  . metoCLOPramide (REGLAN) 10 MG tablet Take 1 tablet (10 mg total) by mouth every 6 (six) hours. (Patient taking differently: Take 10 mg by mouth 2 (two) times daily. ) 30 tablet 0  . mupirocin ointment (BACTROBAN) 2 % Place 1 application into the  nose 2 (two) times daily. 22 g 0  . ondansetron (ZOFRAN) 8 MG tablet Take 8 mg by mouth every 6 (six) hours as needed for nausea/vomiting.    Marland Kitchen oxyCODONE (OXY IR/ROXICODONE) 5 MG immediate release tablet Take 1 tablet (5 mg total) by mouth every 6 (six) hours as needed for moderate pain or severe pain. 30 tablet 0  . pantoprazole (PROTONIX) 40 MG tablet Take 40 mg by mouth daily.  5  . sertraline (ZOLOFT) 100 MG tablet Take 100 mg by mouth daily.   5  . sildenafil (REVATIO) 20 MG tablet 1-5 pills as needed for ED symptoms (Patient taking differently: Take 20-100 mg by mouth daily as needed (ED). ) 30 tablet 0  . tiZANidine (ZANAFLEX) 4 MG tablet Take 1 tablet (4 mg total) by mouth every 6 (six) hours as needed for muscle spasms. 60 tablet 0  . vancomycin IVPB Inject 2,500 mg into the vein daily for 28 days. Indication: MRSA, enterococcus, enterobacter and bacteroides  Last Day of Therapy:  04/06/2019 Labs - Monday:  CBC/D, BMP, and vancomycin trough. Labs - Thursday:  BMP and vancomycin trough Labs - Every other week:  ESR and CRP 28 Units 0  . vitamin B-12 (CYANOCOBALAMIN) 1000 MCG tablet Take 1,000 mcg by mouth daily.     Current Facility-Administered Medications  Medication Dose Route Frequency Provider Last Rate Last Dose  . DAPTOmycin (CUBICIN) 780 mg in sodium chloride 0.9 % IVPB  780 mg Intravenous Once Leonel Ramsay, MD         Abtx:  Anti-infectives (From admission, onward)   None      REVIEW OF SYSTEMS:  Const: negative fever, negative chills, negative weight loss Eyes: negative  diplopia or visual changes, negative eye pain ENT: negative coryza, negative sore throat Resp: negative cough, hemoptysis, dyspnea Cards: negative for chest pain, palpitations, lower extremity edema GU: negative for frequency, dysuria and hematuria GI: Negative for abdominal pain, diarrhea, bleeding, constipation Skin: negative for rash and pruritus Heme: negative for easy bruising and gum/nose bleeding MS: negative for myalgias, arthralgias, back pain and muscle weakness Neurolo:negative for headaches, dizziness, vertigo, memory problems  Psych: negative for feelings of anxiety, depression  Endocrine: negative for thyroid, diabetes Allergy/Immunology- negative for any medication or food allergies  Objective:  VITALS:  BP 135/90 (BP Location: Left Arm, Patient Position: Sitting, Cuff Size: Normal)   Pulse 92   Temp 98.3 F (36.8 C) (Oral)   Ht _0  (1.778 m)   Wt 211 lb (95.7 kg)   BMI 30.28 kg/m  PHYSICAL EXAM:  General: Alert, cooperative, no distress, appears stated age.  Head: Normocephalic, without obvious abnormality, atraumatic. Rt PICC site- clean Extremities: rt foot surgical site - 2 4th toe amputated- site has almost healed - Small ulcer on the lateral margin of the foot- and a small opening on the plantars surface        Skin: No rashes or lesions. Or bruising Lymph: Cervical, supraclavicular normal. Neurologic: Grossly non-focal Pertinent Labs Lab Results 5/11 Cr 1.13 Hb 10.9 WBC 8 ? Impression/Recommendation ? ?Diabetic foot infection Rt with polymicrobial infection- MRSA foot infection- also had osteomyelitis of the 4th toe- s/p 4th ray excision and I/D- was discharged on VAnco and ceftriaxone to complete 4 weeks until 04/06/19. On 4/29 vanco changed to dapto for increasing cr. Also received 2 weeks of PO flagyl Has competed IV yesterday- doing much better- wound has almost healed PICC will be removed today. Will give Bactrim+augmentin for  2  weeks  Increasing Cr - vanco was changed to Dapto on 4/29  DM- on insulin, Jardiance ?  Discussed with patient in detail Follow up with Dr.Fowler as scheduled Will see him PRN

## 2019-04-20 ENCOUNTER — Emergency Department
Admission: EM | Admit: 2019-04-20 | Discharge: 2019-04-21 | Disposition: A | Discharge status: Home / Self Care | Payer: Medicaid Other | Attending: Emergency Medicine | Admitting: Emergency Medicine

## 2019-04-20 ENCOUNTER — Encounter: Payer: Self-pay | Admitting: Emergency Medicine

## 2019-04-20 ENCOUNTER — Other Ambulatory Visit: Payer: Self-pay

## 2019-04-20 DIAGNOSIS — R1013 Epigastric pain: Secondary | ICD-10-CM | POA: Diagnosis present

## 2019-04-20 DIAGNOSIS — I10 Essential (primary) hypertension: Secondary | ICD-10-CM | POA: Insufficient documentation

## 2019-04-20 DIAGNOSIS — K3184 Gastroparesis: Secondary | ICD-10-CM | POA: Diagnosis not present

## 2019-04-20 DIAGNOSIS — F17210 Nicotine dependence, cigarettes, uncomplicated: Secondary | ICD-10-CM | POA: Diagnosis not present

## 2019-04-20 DIAGNOSIS — Z794 Long term (current) use of insulin: Secondary | ICD-10-CM | POA: Diagnosis not present

## 2019-04-20 DIAGNOSIS — Z79899 Other long term (current) drug therapy: Secondary | ICD-10-CM | POA: Insufficient documentation

## 2019-04-20 DIAGNOSIS — E119 Type 2 diabetes mellitus without complications: Secondary | ICD-10-CM | POA: Diagnosis not present

## 2019-04-20 LAB — COMPREHENSIVE METABOLIC PANEL
ALT: 24 U/L (ref 0–44)
AST: 32 U/L (ref 15–41)
Albumin: 4.5 g/dL (ref 3.5–5.0)
Alkaline Phosphatase: 146 U/L — ABNORMAL HIGH (ref 38–126)
Anion gap: 13 (ref 5–15)
BUN: 28 mg/dL — ABNORMAL HIGH (ref 6–20)
CO2: 20 mmol/L — ABNORMAL LOW (ref 22–32)
Calcium: 9.7 mg/dL (ref 8.9–10.3)
Chloride: 99 mmol/L (ref 98–111)
Creatinine, Ser: 1.6 mg/dL — ABNORMAL HIGH (ref 0.61–1.24)
GFR calc Af Amer: >60 mL/min (ref >60)
GFR calc non Af Amer: 52 mL/min — ABNORMAL LOW (ref >60)
Glucose, Bld: 174 mg/dL — ABNORMAL HIGH (ref 70–99)
Potassium: 4.4 mmol/L (ref 3.5–5.1)
Sodium: 132 mmol/L — ABNORMAL LOW (ref 135–145)
Total Bilirubin: 0.9 mg/dL (ref 0.3–1.2)
Total Protein: 8.4 g/dL — ABNORMAL HIGH (ref 6.5–8.1)

## 2019-04-20 LAB — CBC WITH DIFFERENTIAL/PLATELET
Abs Immature Granulocytes: 0.03 10*3/uL (ref 0.00–0.07)
Basophils Absolute: 0 10*3/uL (ref 0.0–0.1)
Basophils Relative: 0 %
Eosinophils Absolute: 0 10*3/uL (ref 0.0–0.5)
Eosinophils Relative: 0 %
HCT: 36.7 % — ABNORMAL LOW (ref 39.0–52.0)
Hemoglobin: 12.3 g/dL — ABNORMAL LOW (ref 13.0–17.0)
Immature Granulocytes: 0 %
Lymphocytes Relative: 11 %
Lymphs Abs: 1.3 10*3/uL (ref 0.7–4.0)
MCH: 27.5 pg (ref 26.0–34.0)
MCHC: 33.5 g/dL (ref 30.0–36.0)
MCV: 82.1 fL (ref 80.0–100.0)
Monocytes Absolute: 0.3 10*3/uL (ref 0.1–1.0)
Monocytes Relative: 2 %
Neutro Abs: 10.2 10*3/uL — ABNORMAL HIGH (ref 1.7–7.7)
Neutrophils Relative %: 87 %
Platelets: 260 10*3/uL (ref 150–400)
RBC: 4.47 MIL/uL (ref 4.22–5.81)
RDW: 14.4 % (ref 11.5–15.5)
WBC: 11.8 10*3/uL — ABNORMAL HIGH (ref 4.0–10.5)
nRBC: 0 % (ref 0.0–0.2)

## 2019-04-20 LAB — TROPONIN I: Troponin I: <0.03 ng/mL (ref <0.03)

## 2019-04-20 LAB — LIPASE, BLOOD: Lipase: 37 U/L (ref 11–51)

## 2019-04-20 NOTE — ED Triage Notes (Signed)
Pt to triage via w/c with no distress noted; st "I'm having a gastroparesis attack"; c/o mid upper abd pain and vomiting since this am

## 2019-04-21 ENCOUNTER — Telehealth: Payer: Self-pay | Admitting: Infectious Diseases

## 2019-04-21 MED ORDER — ONDANSETRON HCL 4 MG/2ML IJ SOLN
4.0000 mg | Freq: Once | INTRAMUSCULAR | Status: AC
  Administered 2019-04-21: 4 mg via INTRAVENOUS
  Filled 2019-04-21: qty 2

## 2019-04-21 MED ORDER — SODIUM CHLORIDE 0.9 % IV BOLUS
1000.0000 mL | Freq: Once | INTRAVENOUS | Status: AC
  Administered 2019-04-21: 1000 mL via INTRAVENOUS

## 2019-04-21 MED ORDER — DICYCLOMINE HCL 10 MG PO CAPS: 10.0000 mg | ORAL_CAPSULE | Freq: Four times a day (QID) | ORAL | 0 refills | Status: DC | PRN

## 2019-04-21 MED ORDER — ONDANSETRON 4 MG PO TBDP: 4.0000 mg | ORAL_TABLET | Freq: Three times a day (TID) | ORAL | 0 refills | Status: DC | PRN

## 2019-04-21 MED ORDER — FENTANYL CITRATE (PF) 100 MCG/2ML IJ SOLN
100.0000 ug | Freq: Once | INTRAMUSCULAR | Status: AC
  Administered 2019-04-21: 100 ug via INTRAVENOUS
  Filled 2019-04-21: qty 2

## 2019-04-21 MED ORDER — FAMOTIDINE IN NACL 20-0.9 MG/50ML-% IV SOLN
20.0000 mg | Freq: Once | INTRAVENOUS | Status: AC
  Administered 2019-04-21: 20 mg via INTRAVENOUS
  Filled 2019-04-21: qty 50

## 2019-04-21 NOTE — Telephone Encounter (Signed)
[name] called stating that Richard Mendoza was in [facility] ED last night and labs were taking and showing the WBC was/is high.   [name] would like WBC's to be relooked at by Dr. Rivka Safer    Call back is [phone]   Thanks

## 2019-04-21 NOTE — ED Notes (Signed)
Crackers and diet cola provided for pt per MD request. Will continue to assess.

## 2019-04-21 NOTE — ED Notes (Signed)
Pt resting on stretcher with lights off. Eyes closed and even respirations. No increased work of breathing or acute distress noted. Pt states he continues to  feel better.

## 2019-04-21 NOTE — Discharge Instructions (Signed)

## 2019-04-21 NOTE — ED Notes (Signed)
Pt states he is feeling much better after his soda and crackers. Denies nausea.

## 2019-04-21 NOTE — ED Provider Notes (Signed)
[facility] Emergency Department Provider Note  ____________________________________________  Time seen: Approximately 12:21 AM  I have reviewed the triage vital signs and the nursing notes.   HISTORY  Chief Complaint Abdominal Pain and Emesis   HPI Richard Mendoza is a 43 y.o. male with a history of type 2 diabetes on an insulin pump, hypertension, hyperlipidemia, gastroparesis who presents for abdominal pain, vomiting and diarrhea.  Symptoms started today.  Patient reports more than 20 episodes of nonbloody nonbilious emesis and 3 episodes of watery diarrhea with no melena or hematochezia.  Is complaining of severe cramping epigastric abdominal pain which has been constant throughout most of the day.  Has been unable to keep his medications down today.  No fever or chills, no dysuria or hematuria, no chest pain, cough, shortness of breath.  Patient reports that his symptoms are identical to prior exacerbations of gastroparesis.  Past Medical History:  Diagnosis Date  . DIABETES MELLITUS, TYPE II, UNCONTROLLED 03/17/2009  . DM 12/08/2008  . HYPERLIPIDEMIA 03/17/2009  . HYPERTENSION 12/08/2008  . 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 Diabetic foot infection (HCC) 03/05/2019  . Abscess 11/25/2018  . Intractable nausea and vomiting 10/21/2017  . HNP (herniated nucleus pulposus), cervical 09/09/2017  . Routine general medical examination at a health care facility 01/20/2014  . 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 HYPERLIPIDEMIA 03/17/2009  . DM 12/08/2008  . OBSTRUCTIVE SLEEP APNEA 12/08/2008  . Essential hypertension 12/08/2008    Past Surgical History:  Procedure Laterality Date  . ANTERIOR CERVICAL DECOMP/DISCECTOMY FUSION N/A 09/09/2017   Procedure: ANTERIOR CERVICAL DECOMPRESSION/DISCECTOMY FUSION CERVICAL 6- CERVICAL 7;  Surgeon: Coletta Memosabbell,  Kyle, MD;  Location: [facility];  Service: Neurosurgery;  Laterality: N/A;  ANTERIOR CERVICAL DECOMPRESSION/DISCECTOMY FUSION CERVICAL 6- CERVICAL 7  . APPENDECTOMY    . I&D EXTREMITY Right 10/03/2017   Procedure: IRRIGATION AND DEBRIDEMENT RIGHT WRIST;  Surgeon: Betha LoaKuzma, Kevin, MD;  Location: [facility];  Service: Orthopedics;  Laterality: Right;  . I&D EXTREMITY Right 11/26/2018   Procedure: IRRIGATION AND DEBRIDEMENT FASCIA ON RIGHT FOOT;  Surgeon: Gwyneth RevelsFowler, Justin, DPM;  Location: [facility];  Service: Podiatry;  Laterality: Right;  . INCISION AND DRAINAGE Right 03/06/2019   Procedure: INCISION AND DRAINAGE RIGHT FOOT, WITH 4th RAY AMPUTATION;  Surgeon: Gwyneth RevelsFowler, Justin, DPM;  Location: [facility];  Service: Podiatry;  Laterality: Right;  . osteomylitis    . ROTATOR CUFF REPAIR Left     Prior to Admission medications   Medication Sig Start Date End Date Taking? Authorizing Provider  acetaminophen (TYLENOL) 325 MG tablet Take 2 tablets (650 mg total) by mouth every 6 (six) hours as needed for mild pain (or Fever >/= 101). 11/29/18   Gouru, Deanna ArtisAruna, MD  aMILoride (MIDAMOR) 5 MG tablet Take 1 tablet (5 mg total) by mouth daily. 11/30/18   Ramonita LabGouru, Aruna, MD  amoxicillin-clavulanate (AUGMENTIN) 875-125 MG tablet Take 1 tablet by mouth 2 (two) times daily. 04/07/19   Lynn Itoavishankar, Jayashree, MD  atorvastatin (LIPITOR) 20 MG tablet Take 20 mg by mouth daily. 08/23/17   [provider]  busPIRone (BUSPAR) 15 MG tablet Take 15 mg by mouth 2 (two) times daily.    [provider]  [employer] SENNA 8.6 MG tablet Take 1 tablet by mouth 2 (two) times daily.  10/31/17   [provider]  dicyclomine (BENTYL) 10 MG capsule Take 1 capsule (10  mg total) by mouth 4 (four) times daily as needed for up to 14 days (abdominal spasms). 04/21/19 05/05/19  Nita Sickle, MD  dimenhyDRINATE (DRAMAMINE) 50 MG tablet Take 50 mg by mouth 2 (two) times daily as needed for itching or dizziness.     [provider]   docusate sodium (COLACE) 100 MG capsule Take 1 capsule (100 mg total) by mouth 2 (two) times daily as needed for mild constipation. 03/10/19   Ramonita Lab, MD  famotidine (PEPCID) 20 MG tablet Take 20 mg by mouth 2 (two) times daily.    [provider]  gabapentin (NEURONTIN) 300 MG capsule Take 600 mg by mouth at bedtime.     [provider]  insulin aspart (NOVOLOG) 100 UNIT/ML injection Inject into the skin 3 (three) times daily before meals. Via pump    [provider]  Insulin Human (INSULIN PUMP) SOLN Inject 2.5 each into the skin every hour.    [provider]  linaclotide (LINZESS) 72 MCG capsule Take 72 mcg by mouth daily as needed (constipation).    [provider]  lisinopril (PRINIVIL,ZESTRIL) 5 MG tablet Take 5 mg by mouth daily. 10/14/17   [provider]  metoCLOPramide (REGLAN) 10 MG tablet Take 1 tablet (10 mg total) by mouth every 6 (six) hours. Patient taking differently: Take 10 mg by mouth 2 (two) times daily.  11/17/17   Elson Areas, PA-C  mupirocin ointment (BACTROBAN) 2 % Place 1 application into the nose 2 (two) times daily. 03/10/19   Gouru, Deanna Artis, MD  ondansetron (ZOFRAN ODT) 4 MG disintegrating tablet Take 1 tablet (4 mg total) by mouth every 8 (eight) hours as needed. 04/21/19   Nita Sickle, MD  ondansetron (ZOFRAN) 8 MG tablet Take 8 mg by mouth every 6 (six) hours as needed for nausea/vomiting.    [provider]  oxyCODONE (OXY IR/ROXICODONE) 5 MG immediate release tablet Take 1 tablet (5 mg total) by mouth every 6 (six) hours as needed for moderate pain or severe pain. 03/10/19   Gouru, Deanna Artis, MD  pantoprazole (PROTONIX) 40 MG tablet Take 40 mg by mouth daily. 08/23/17   [provider]  sertraline (ZOLOFT) 100 MG tablet Take 100 mg by mouth daily.  10/31/17   [provider]  sildenafil (REVATIO) 20 MG tablet 1-5 pills as needed for ED symptoms Patient taking differently: Take  20-100 mg by mouth daily as needed (ED).  09/26/15   Romero Belling, MD  sulfamethoxazole-trimethoprim (BACTRIM DS) 800-160 MG tablet Take 1 tablet by mouth 2 (two) times daily. 04/07/19   Lynn Ito, MD  tiZANidine (ZANAFLEX) 4 MG tablet Take 1 tablet (4 mg total) by mouth every 6 (six) hours as needed for muscle spasms. 09/10/17   Coletta Memos, MD  vitamin B-12 (CYANOCOBALAMIN) 1000 MCG tablet Take 1,000 mcg by mouth daily. 01/27/19   [provider]    Allergies Patient has no known allergies.  Family History  Problem Relation Age of Onset  . Diabetes Mother   . Heart disease Father   . Diabetes Father   . Arthritis Other   . Hyperlipidemia Other   . Hypertension Other   . Cancer Other        breast    Social History Social History   Tobacco Use  . Smoking status: Current Every Day Smoker    Packs/day: 1.00    Years: 17.00    Pack years: 17.00    Types: Cigarettes  . Smokeless tobacco:  Never Used  . Tobacco comment: currently smoking 1 ppd.    Substance Use Topics  . Alcohol use: Yes    Frequency: Never    Comment: rare  . Drug use: No    Review of Systems  Constitutional: Negative for fever. Eyes: Negative for visual changes. ENT: Negative for sore throat. Neck: No neck pain  Cardiovascular: Negative for chest pain. Respiratory: Negative for shortness of breath. Gastrointestinal: + abdominal pain, vomiting and diarrhea. Genitourinary: Negative for dysuria. Musculoskeletal: Negative for back pain. Skin: Negative for rash. Neurological: Negative for headaches, weakness or numbness. Psych: No SI or HI  ____________________________________________   PHYSICAL EXAM:  VITAL SIGNS: ED Triage Vitals  Enc Vitals Group     BP 04/20/19 2144 (!) 143/87     Pulse Rate 04/20/19 2144 90     Resp 04/20/19 2144 18     Temp 04/20/19 2144 98 F (36.7 C)     Temp Source 04/20/19 2144 Oral     SpO2 04/20/19 2144 100 %     Weight 04/20/19 2144 215  lb (97.5 kg)     Height 04/20/19 2144 5\' 10"  (1.778 m)     Head Circumference --      Peak Flow --      Pain Score 04/20/19 2145 10     Pain Loc --      Pain Edu? --      Excl. in GC? --     Constitutional: Alert and oriented, looks uncomfortable but in no distress. HEENT:      Head: Normocephalic and atraumatic.         Eyes: Conjunctivae are normal. Sclera is non-icteric.       Mouth/Throat: Mucous membranes are moist.       Neck: Supple with no signs of meningismus. Cardiovascular: Regular rate and rhythm. No murmurs, gallops, or rubs. 2+ symmetrical distal pulses are present in all extremities. No JVD. Respiratory: Normal respiratory effort. Lungs are clear to auscultation bilaterally. No wheezes, crackles, or rhonchi.  Gastrointestinal: Soft, mild tenderness to palpation in the epigastric region, and non distended with positive bowel sounds. No rebound or guarding. Musculoskeletal: Nontender with normal range of motion in all extremities. No edema, cyanosis, or erythema of extremities. Neurologic: Normal speech and language. Face is symmetric. Moving all extremities. No gross focal neurologic deficits are appreciated. Skin: Skin is warm, dry and intact. No rash noted. Psychiatric: Mood and affect are normal. Speech and behavior are normal.  ____________________________________________   LABS (all labs ordered are listed, but only abnormal results are displayed)  Labs Reviewed  CBC WITH DIFFERENTIAL/PLATELET - Abnormal; Notable for the following components:      Result Value   WBC 11.8 (*)    Hemoglobin 12.3 (*)    HCT 36.7 (*)    Neutro Abs 10.2 (*)    All other components within normal limits  COMPREHENSIVE METABOLIC PANEL - Abnormal; Notable for the following components:   Sodium 132 (*)    CO2 20 (*)    Glucose, Bld 174 (*)    BUN 28 (*)    Creatinine, Ser 1.60 (*)    Total Protein 8.4 (*)    Alkaline Phosphatase 146 (*)    GFR calc non Af Amer 52 (*)    All  other components within normal limits  LIPASE, BLOOD  TROPONIN I  URINALYSIS, COMPLETE (UACMP) WITH MICROSCOPIC   ____________________________________________  EKG  ED ECG REPORT I, Nita Sickle, the attending physician, personally viewed  and interpreted this ECG.  Normal sinus rhythm, rate of 95, normal intervals, normal axis, no ST elevations or depressions.  Unchanged from prior. ____________________________________________  RADIOLOGY  none  ____________________________________________   PROCEDURES  Procedure(s) performed: None Procedures Critical Care performed:  None ____________________________________________   INITIAL IMPRESSION / ASSESSMENT AND PLAN / ED COURSE  43 y.o. male with a history of type 2 diabetes on an insulin pump, hypertension, hyperlipidemia, gastroparesis who presents for abdominal pain, vomiting and diarrhea.  Patient reports the symptoms are identical to prior episodes of gastroparesis.  Patient looks uncomfortable but in no distress, he has no fever or tachycardia.  Slightly hypertensive in the setting of being unable to keep his antihypertensive medications down today.  Abdomen is soft, nondistended with mild epigastric tenderness.  Labs show no leukocytosis, no DKA with normal anion gap.  Slightly elevated creatinine concerning for mild dehydration, normal lipase and LFTs.  EKG and troponin no ischemic changes.  Will give IV fluids, Zofran and fentanyl and reassess.    _________________________ 2:12 AM on 04/21/2019 -----------------------------------------  Patient feels markedly improved after IV fluids, Zofran and fentanyl.  Tolerating p.o.  Abdominal exam remains benign.  Patient be discharged home with Zofran and Bentyl, follow-up with PCP.  Discussed my standard return precautions.   As part of my medical decision making, I reviewed the following data within the electronic MEDICAL RECORD NUMBER Nursing notes reviewed and incorporated, Labs  reviewed , EKG interpreted , Old chart reviewed, Notes from prior ED visits and [place] Controlled Substance Database    Pertinent labs & imaging results that were available during my care of the patient were reviewed by me and considered in my medical decision making (see chart for details).    ____________________________________________   FINAL CLINICAL IMPRESSION(S) / ED DIAGNOSES  Final diagnoses:  Gastroparesis  Epigastric pain      NEW MEDICATIONS STARTED DURING THIS VISIT:  ED Discharge Orders         Ordered    ondansetron (ZOFRAN ODT) 4 MG disintegrating tablet  Every 8 hours PRN     04/21/19 0212    dicyclomine (BENTYL) 10 MG capsule  4 times daily PRN     04/21/19 0212           Note:  This document was prepared using Dragon voice recognition software and may include unintentional dictation errors.    Nita Sickle, MD 04/21/19 [phone]

## 2019-04-21 NOTE — ED Notes (Signed)
Pt resting on stretcher with lights off to enhance rest.. pt reports he is feeling better. Iv fluids complete. Provided for safety and comfort and will continue to assess.

## 2019-04-23 NOTE — Telephone Encounter (Signed)
I called twice and I left a message today on the phone number listed.

## 2019-05-01 ENCOUNTER — Other Ambulatory Visit: Payer: Self-pay | Admitting: Podiatry

## 2019-05-01 ENCOUNTER — Other Ambulatory Visit (HOSPITAL_COMMUNITY): Payer: Self-pay | Admitting: Podiatry

## 2019-05-01 DIAGNOSIS — L97512 Non-pressure chronic ulcer of other part of right foot with fat layer exposed: Secondary | ICD-10-CM

## 2019-05-07 ENCOUNTER — Other Ambulatory Visit: Payer: Self-pay

## 2019-05-07 ENCOUNTER — Ambulatory Visit
Admission: RE | Admit: 2019-05-07 | Discharge: 2019-05-07 | Disposition: A | Discharge status: Home / Self Care | Payer: Medicaid Other | Source: Ambulatory Visit | Attending: Podiatry | Admitting: Podiatry

## 2019-05-07 DIAGNOSIS — L97512 Non-pressure chronic ulcer of other part of right foot with fat layer exposed: Secondary | ICD-10-CM | POA: Diagnosis present

## 2019-05-12 ENCOUNTER — Other Ambulatory Visit: Payer: Self-pay

## 2019-05-12 ENCOUNTER — Other Ambulatory Visit: Payer: Self-pay | Admitting: Podiatry

## 2019-05-12 ENCOUNTER — Other Ambulatory Visit
Admission: RE | Admit: 2019-05-12 | Discharge: 2019-05-12 | Disposition: A | Discharge status: Home / Self Care | Payer: Medicaid Other | Source: Ambulatory Visit | Attending: Podiatry | Admitting: Podiatry

## 2019-05-12 DIAGNOSIS — Z1159 Encounter for screening for other viral diseases: Secondary | ICD-10-CM | POA: Insufficient documentation

## 2019-05-13 ENCOUNTER — Inpatient Hospital Stay: Admission: RE | Admit: 2019-05-13 | Payer: Medicaid Other | Source: Ambulatory Visit

## 2019-05-14 ENCOUNTER — Encounter
Admission: RE | Admit: 2019-05-14 | Discharge: 2019-05-14 | Disposition: A | Discharge status: Home / Self Care | Payer: Medicaid Other | Source: Ambulatory Visit | Attending: Podiatry | Admitting: Podiatry

## 2019-05-14 LAB — NOVEL CORONAVIRUS, NAA (HOSP ORDER, SEND-OUT TO REF LAB; TAT 18-24 HRS): SARS-CoV-2, NAA: NOT DETECTED

## 2019-05-14 MED ORDER — CEFAZOLIN SODIUM-DEXTROSE 2-4 GM/100ML-% IV SOLN
2.0000 g | INTRAVENOUS | Status: AC
  Administered 2019-05-15: 2 g via INTRAVENOUS

## 2019-05-14 NOTE — Patient Instructions (Signed)
Your procedure is scheduled on: 05/15/19 Report to Day Surgery.[facility] To find out your arrival time please call [phone] between 1PM - 3PM on 05/14/19 Remember: Instructions that are not followed completely may result in serious medical risk,  up to and including death, or upon the discretion of your surgeon and anesthesiologist your  surgery may need to be rescheduled.     _X__ 1. Do not eat food after midnight the night before your procedure.                 No gum chewing or hard candies. You may drink clear liquids up to 2 hours                 before you are scheduled to arrive for your surgery- DO not drink clear                 liquids within 2 hours of the start of your surgery.                 Clear Liquids include:  water, apple juice without pulp, clear carbohydrate                 drink such as Clearfast of Gatorade, Black Coffee or Tea (Do not add                 anything to coffee or tea). PLEASE BUY G2 LOW SUGAR GATORADE AND DRINK 12 OUNCES A FUKLL 2 HOURS BEFORE SCHEDULED ARRIVAL  __X__2.  On the morning of surgery brush your teeth with toothpaste and water, you                may rinse your mouth with mouthwash if you wish.  Do not swallow any toothpaste of mouthwash.     _X__ 3.  No Alcohol for 24 hours before or after surgery.   _X__ 4.  Do Not Smoke or use e-cigarettes For 24 Hours Prior to Your Surgery.                 Do not use any chewable tobacco products for at least 6 hours prior to                 surgery.  ____  5.  Bring all medications with you on the day of surgery if instructed.   _X___  6.  Notify your doctor if there is any change in your medical condition      (cold, fever, infections).     Do not wear jewelry, make-up, hairpins, clips or nail polish. Do not wear lotions, powders, or perfumes. You may wear deodorant. Do not shave 48 hours prior to surgery. Men may shave face and neck. Do not bring  valuables to the hospital.    [facility] is not responsible for any belongings or valuables.  Contacts, dentures or bridgework may not be worn into surgery. Leave your suitcase in the car. After surgery it may be brought to your room. For patients admitted to the hospital, discharge time is determined by your treatment team.   Patients discharged the day of surgery will not be allowed to drive home.          __X__ Take these medicines the morning of surgery with A SIP OF WATER:    1. BUSPIRONE  2. METOCLOPRAMIDE  3. PANTOPRAZOLE  4. SERTRALINE  5.  6.  ____ Fleet Enema (as directed)   ____ Use CHG  Soap as directed  ____ Use inhalers on the day of surgery  ____ Stop metformin 2 days prior to surgery    ____ Take 1/2 of usual insulin dose the night before surgery. No insulin the morning          of surgery. ____ Stop Coumadin/Plavix/aspirin on   ____ Stop Anti-inflammatories on    ____ Stop supplements until after surgery.    ____ Bring C-Pap to the hospital.    LEAVE INSULIN PUMP AS USUAL

## 2019-05-14 NOTE — Pre-Procedure Instructions (Signed)
LM X 2 NUMBERS 05/13/19 IN ATTEMPT TO DO PHONE INTERVIEW. RETURN CALL BY WIFE LEFT ON [facility] AFTER PRE ADMIT CLOSED. SPOKE WITH WIFE THIS AM. LM FOR PATIENT TO RETURN CALL AT NUMBER GIVEN BY WIFE. NO RETURN CALL. SPOKE WITH WIFE AND PREOP INSTRUCTIONS GIVEN TO HER. NOTIFIED DR Karna Christmas OFFICE DID NOT INTERVIEW PATIENT BUT WIFE GIVEN INSTRUCTIONS

## 2019-05-15 ENCOUNTER — Ambulatory Visit: Payer: Medicaid Other | Admitting: Anesthesiology

## 2019-05-15 ENCOUNTER — Other Ambulatory Visit: Payer: Self-pay | Admitting: Infectious Diseases

## 2019-05-15 ENCOUNTER — Ambulatory Visit
Admission: RE | Admit: 2019-05-15 | Discharge: 2019-05-15 | Disposition: A | Discharge status: Home / Self Care | Payer: Medicaid Other | Attending: Podiatry | Admitting: Podiatry

## 2019-05-15 ENCOUNTER — Encounter
Admission: RE | Disposition: A | Discharge status: Home / Self Care | Payer: Self-pay | Source: Home / Self Care | Attending: Podiatry

## 2019-05-15 ENCOUNTER — Encounter: Payer: Self-pay | Admitting: *Deleted

## 2019-05-15 ENCOUNTER — Other Ambulatory Visit: Payer: Self-pay

## 2019-05-15 DIAGNOSIS — Z79899 Other long term (current) drug therapy: Secondary | ICD-10-CM | POA: Diagnosis not present

## 2019-05-15 DIAGNOSIS — M86171 Other acute osteomyelitis, right ankle and foot: Secondary | ICD-10-CM | POA: Insufficient documentation

## 2019-05-15 DIAGNOSIS — E785 Hyperlipidemia, unspecified: Secondary | ICD-10-CM | POA: Insufficient documentation

## 2019-05-15 DIAGNOSIS — F17210 Nicotine dependence, cigarettes, uncomplicated: Secondary | ICD-10-CM | POA: Diagnosis not present

## 2019-05-15 DIAGNOSIS — E1169 Type 2 diabetes mellitus with other specified complication: Secondary | ICD-10-CM | POA: Diagnosis present

## 2019-05-15 DIAGNOSIS — Z794 Long term (current) use of insulin: Secondary | ICD-10-CM | POA: Diagnosis not present

## 2019-05-15 DIAGNOSIS — L97519 Non-pressure chronic ulcer of other part of right foot with unspecified severity: Secondary | ICD-10-CM | POA: Insufficient documentation

## 2019-05-15 DIAGNOSIS — E11621 Type 2 diabetes mellitus with foot ulcer: Secondary | ICD-10-CM | POA: Diagnosis not present

## 2019-05-15 DIAGNOSIS — G4733 Obstructive sleep apnea (adult) (pediatric): Secondary | ICD-10-CM | POA: Diagnosis not present

## 2019-05-15 DIAGNOSIS — I10 Essential (primary) hypertension: Secondary | ICD-10-CM | POA: Insufficient documentation

## 2019-05-15 HISTORY — PX: METATARSAL HEAD EXCISION: SHX5027

## 2019-05-15 LAB — GLUCOSE, CAPILLARY
Glucose-Capillary: 67 mg/dL — ABNORMAL LOW (ref 70–99)
Glucose-Capillary: 66 mg/dL — ABNORMAL LOW (ref 70–99)
Glucose-Capillary: 114 mg/dL — ABNORMAL HIGH (ref 70–99)

## 2019-05-15 SURGERY — EXCISION, METATARSAL BONE, HEAD
Anesthesia: General | Site: Foot | Laterality: Right

## 2019-05-15 MED ORDER — ONDANSETRON HCL 4 MG/2ML IJ SOLN: 4.0000 mg | Freq: Four times a day (QID) | INTRAMUSCULAR | Status: DC | PRN

## 2019-05-15 MED ORDER — ONDANSETRON HCL 4 MG PO TABS: 4.0000 mg | ORAL_TABLET | Freq: Four times a day (QID) | ORAL | Status: DC | PRN

## 2019-05-15 MED ORDER — PROPOFOL 500 MG/50ML IV EMUL
INTRAVENOUS | Status: AC
  Filled 2019-05-15: qty 50

## 2019-05-15 MED ORDER — SEVOFLURANE IN SOLN
RESPIRATORY_TRACT | Status: AC
  Filled 2019-05-15: qty 250

## 2019-05-15 MED ORDER — OXYCODONE-ACETAMINOPHEN 5-325 MG PO TABS: 1.0000 | ORAL_TABLET | Freq: Four times a day (QID) | ORAL | 0 refills | Status: DC | PRN

## 2019-05-15 MED ORDER — MEPERIDINE HCL 50 MG/ML IJ SOLN: 6.2500 mg | INTRAMUSCULAR | Status: DC | PRN

## 2019-05-15 MED ORDER — CEFAZOLIN SODIUM-DEXTROSE 2-4 GM/100ML-% IV SOLN
INTRAVENOUS | Status: AC
  Filled 2019-05-15: qty 100

## 2019-05-15 MED ORDER — MIDAZOLAM HCL 2 MG/2ML IJ SOLN
INTRAMUSCULAR | Status: AC
  Filled 2019-05-15: qty 2

## 2019-05-15 MED ORDER — THROMBIN 5000 UNITS EX SOLR
CUTANEOUS | Status: DC | PRN
  Administered 2019-05-15: 5000 [IU] via TOPICAL

## 2019-05-15 MED ORDER — DEXTROSE 50 % IV SOLN
25.0000 mL | Freq: Once | INTRAVENOUS | Status: AC
  Administered 2019-05-15: 10:00:00 25 mL via INTRAVENOUS

## 2019-05-15 MED ORDER — OXYCODONE HCL 5 MG PO TABS
ORAL_TABLET | ORAL | Status: AC
  Filled 2019-05-15: qty 1

## 2019-05-15 MED ORDER — DEXTROSE 50 % IV SOLN
INTRAVENOUS | Status: AC
  Administered 2019-05-15: 25 mL via INTRAVENOUS
  Filled 2019-05-15: qty 50

## 2019-05-15 MED ORDER — SODIUM CHLORIDE 0.9 % IV SOLN
INTRAVENOUS | Status: DC
  Administered 2019-05-15: 09:00:00 via INTRAVENOUS

## 2019-05-15 MED ORDER — BUPIVACAINE HCL (PF) 0.5 % IJ SOLN
INTRAMUSCULAR | Status: DC | PRN
  Administered 2019-05-15: 5 mL
  Administered 2019-05-15: 10 mL

## 2019-05-15 MED ORDER — AMOXICILLIN-POT CLAVULANATE 875-125 MG PO TABS: 1.0000 | ORAL_TABLET | Freq: Two times a day (BID) | ORAL | 0 refills | Status: DC

## 2019-05-15 MED ORDER — POVIDONE-IODINE 7.5 % EX SOLN
Freq: Once | CUTANEOUS | Status: DC
  Filled 2019-05-15: qty 118

## 2019-05-15 MED ORDER — SULFAMETHOXAZOLE-TRIMETHOPRIM 800-160 MG PO TABS: 1.0000 | ORAL_TABLET | Freq: Two times a day (BID) | ORAL | 0 refills | Status: DC

## 2019-05-15 MED ORDER — MIDAZOLAM HCL 2 MG/2ML IJ SOLN
INTRAMUSCULAR | Status: DC | PRN
  Administered 2019-05-15: 2 mg via INTRAVENOUS

## 2019-05-15 MED ORDER — PROPOFOL 10 MG/ML IV BOLUS
INTRAVENOUS | Status: DC | PRN
  Administered 2019-05-15: 20 mg via INTRAVENOUS
  Administered 2019-05-15: 30 mg via INTRAVENOUS

## 2019-05-15 MED ORDER — PROPOFOL 500 MG/50ML IV EMUL
INTRAVENOUS | Status: DC | PRN
  Administered 2019-05-15: 150 ug/kg/min via INTRAVENOUS

## 2019-05-15 MED ORDER — PROMETHAZINE HCL 25 MG/ML IJ SOLN: 6.2500 mg | INTRAMUSCULAR | Status: DC | PRN

## 2019-05-15 MED ORDER — SULFAMETHOXAZOLE-TRIMETHOPRIM 800-160 MG PO TABS: 2.0000 | ORAL_TABLET | Freq: Two times a day (BID) | ORAL | 0 refills | Status: DC

## 2019-05-15 MED ORDER — OXYCODONE HCL 5 MG/5ML PO SOLN: 5.0000 mg | Freq: Once | ORAL | Status: AC | PRN

## 2019-05-15 MED ORDER — FENTANYL CITRATE (PF) 100 MCG/2ML IJ SOLN: 25.0000 ug | INTRAMUSCULAR | Status: DC | PRN

## 2019-05-15 MED ORDER — VANCOMYCIN HCL 1000 MG IV SOLR
INTRAVENOUS | Status: DC | PRN
  Administered 2019-05-15: 1000 mg via INTRAVENOUS

## 2019-05-15 MED ORDER — OXYCODONE HCL 5 MG PO TABS
5.0000 mg | ORAL_TABLET | Freq: Once | ORAL | Status: AC | PRN
  Administered 2019-05-15: 5 mg via ORAL

## 2019-05-15 MED ORDER — LIDOCAINE HCL (PF) 1 % IJ SOLN
INTRAMUSCULAR | Status: DC | PRN
  Administered 2019-05-15: 5 mL

## 2019-05-15 SURGICAL SUPPLY — 54 items
"PENCIL ELECTRO HAND CTR " (MISCELLANEOUS) ×1 IMPLANT
AGENT HMST MTR 8 SURGIFLO (HEMOSTASIS) ×1
BANDAGE ELASTIC 4 LF NS (GAUZE/BANDAGES/DRESSINGS) ×2 IMPLANT
BANDAGE ELASTIC 6 LF NS (GAUZE/BANDAGES/DRESSINGS) ×4 IMPLANT
BLADE MED AGGRESSIVE (BLADE) ×1 IMPLANT
BLADE OSC/SAGITTAL MD 5.5X18 (BLADE) ×2 IMPLANT
BLADE SURG 15 STRL LF DISP TIS (BLADE) ×2 IMPLANT
BLADE SURG 15 STRL SS (BLADE) ×4
BLADE SURG MINI STRL (BLADE) ×2 IMPLANT
BNDG CMPR MED 5X4 ELC HKLP NS (GAUZE/BANDAGES/DRESSINGS) ×1
BNDG CMPR MED 5X6 ELC HKLP NS (GAUZE/BANDAGES/DRESSINGS) ×2
BNDG CONFORM 3 STRL LF (GAUZE/BANDAGES/DRESSINGS) ×2 IMPLANT
BNDG ESMARK 4X12 TAN STRL LF (GAUZE/BANDAGES/DRESSINGS) ×2 IMPLANT
CANISTER SUCT 1200ML W/VALVE (MISCELLANEOUS) ×2 IMPLANT
COVER WAND RF STERILE (DRAPES) ×2 IMPLANT
CUFF TOURN SGL QUICK 12 (TOURNIQUET CUFF) IMPLANT
CUFF TOURN SGL QUICK 18X4 (TOURNIQUET CUFF) ×1 IMPLANT
DRAPE FLUOR MINI C-ARM 54X84 (DRAPES) ×2 IMPLANT
DURAPREP 26ML APPLICATOR (WOUND CARE) ×2 IMPLANT
ELECT REM PT RETURN 9FT ADLT (ELECTROSURGICAL) ×2
ELECTRODE REM PT RTRN 9FT ADLT (ELECTROSURGICAL) ×1 IMPLANT
GAUZE 4X4 16PLY RFD (DISPOSABLE) ×2 IMPLANT
GAUZE SPONGE 4X4 12PLY STRL (GAUZE/BANDAGES/DRESSINGS) ×2 IMPLANT
GAUZE XEROFORM 1X8 LF (GAUZE/BANDAGES/DRESSINGS) ×2 IMPLANT
GLOVE BIO SURGEON STRL SZ7.5 (GLOVE) ×2 IMPLANT
GLOVE INDICATOR 8.0 STRL GRN (GLOVE) ×2 IMPLANT
GOWN STRL REUS W/ TWL LRG LVL3 (GOWN DISPOSABLE) ×2 IMPLANT
GOWN STRL REUS W/TWL LRG LVL3 (GOWN DISPOSABLE) ×4
NDL FILTER BLUNT 18X1 1/2 (NEEDLE) ×1 IMPLANT
NEEDLE FILTER BLUNT 18X 1/2SAF (NEEDLE) ×1
NEEDLE FILTER BLUNT 18X1 1/2 (NEEDLE) ×1 IMPLANT
NEEDLE HYPO 22GX1.5 SAFETY (NEEDLE) ×2 IMPLANT
NS IRRIG 500ML POUR BTL (IV SOLUTION) ×2 IMPLANT
PACK EXTREMITY ARMC (MISCELLANEOUS) ×2 IMPLANT
PAD ABD DERMACEA PRESS 5X9 (GAUZE/BANDAGES/DRESSINGS) ×1 IMPLANT
PAD CAST CTTN 4X4 STRL (SOFTGOODS) ×1 IMPLANT
PAD PREP 24X41 OB/GYN DISP (PERSONAL CARE ITEMS) ×2 IMPLANT
PADDING CAST COTTON 4X4 STRL (SOFTGOODS) ×2
PENCIL ELECTRO HAND CTR (MISCELLANEOUS) ×2 IMPLANT
PIN BALLS 3/8 F/.054-.062 WIRE (MISCELLANEOUS) ×2 IMPLANT
RASP SM TEAR CROSS CUT (RASP) ×1 IMPLANT
SPOGE SURGIFLO 8M (HEMOSTASIS) ×1
SPONGE SURGIFLO 8M (HEMOSTASIS) IMPLANT
STOCKINETTE STRL 6IN 960660 (GAUZE/BANDAGES/DRESSINGS) ×2 IMPLANT
STRAP SAFETY 5IN WIDE (MISCELLANEOUS) ×2 IMPLANT
STRIP CLOSURE SKIN 1/4X4 (GAUZE/BANDAGES/DRESSINGS) ×2 IMPLANT
SUT ETHILON 5-0 FS-2 18 BLK (SUTURE) ×3 IMPLANT
SUT VIC AB 4-0 FS2 27 (SUTURE) ×2 IMPLANT
SUT VIC AB 4-0 RB1 27 (SUTURE) ×2
SUT VIC AB 4-0 RB1 27X BRD (SUTURE) ×1 IMPLANT
SYR 10ML LL (SYRINGE) ×2 IMPLANT
SYR 50ML LL SCALE MARK (SYRINGE) ×2 IMPLANT
WIRE Z .045 C-WIRE SPADE TIP (WIRE) ×2 IMPLANT
WIRE Z .062 C-WIRE SPADE TIP (WIRE) ×2 IMPLANT

## 2019-05-15 NOTE — Anesthesia Postprocedure Evaluation (Signed)
Anesthesia Post Note  Patient: Terin Cragle  Procedure(s) Performed: OSTECTOMY;MET HEAD 5 (Right Foot)  Patient location during evaluation: PACU Anesthesia Type: General Level of consciousness: awake and alert and oriented Pain management: pain level controlled Vital Signs Assessment: post-procedure vital signs reviewed and stable Respiratory status: spontaneous breathing, nonlabored ventilation and respiratory function stable Cardiovascular status: blood pressure returned to baseline and stable Postop Assessment: no signs of nausea or vomiting Anesthetic complications: no     Last Vitals:  Vitals:   05/15/19 1157 05/15/19 1223  BP: (!) 145/93 (!) 148/72  Pulse: 79   Resp: 18 18  Temp: 36.6 C   SpO2: 100% 100%    Last Pain:  Vitals:   05/15/19 1219  TempSrc:   PainSc: 8                  Jasmyne Lodato

## 2019-05-15 NOTE — Anesthesia Post-op Follow-up Note (Signed)
Anesthesia QCDR form completed.        

## 2019-05-15 NOTE — Anesthesia Preprocedure Evaluation (Signed)
Anesthesia Evaluation  Patient identified by MRN, date of birth, ID band Patient awake    Reviewed: Allergy & Precautions, NPO status , Patient's Chart, lab work & pertinent test results  History of Anesthesia Complications Negative for: history of anesthetic complications  Airway Mallampati: II  TM Distance: >3 FB Neck ROM: Full    Dental  (+) Edentulous Upper, Edentulous Lower   Pulmonary neg sleep apnea, neg COPD, Current Smoker,    breath sounds clear to auscultation- rhonchi (-) wheezing      Cardiovascular hypertension, Pt. on medications (-) CAD, (-) Past MI, (-) Cardiac Stents and (-) CABG  Rhythm:Regular Rate:Normal - Systolic murmurs and - Diastolic murmurs    Neuro/Psych neg Seizures negative neurological ROS  negative psych ROS   GI/Hepatic negative GI ROS, Neg liver ROS,   Endo/Other  diabetes, Insulin Dependent  Renal/GU Renal InsufficiencyRenal disease     Musculoskeletal negative musculoskeletal ROS (+)   Abdominal (+) - obese,   Peds  Hematology negative hematology ROS (+)   Anesthesia Other Findings Past Medical History: 03/17/2009: DIABETES MELLITUS, TYPE II, UNCONTROLLED 12/08/2008: DM 03/17/2009: HYPERLIPIDEMIA 12/08/2008: HYPERTENSION 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   Reproductive/Obstetrics                             Anesthesia Physical Anesthesia Plan  ASA: III  Anesthesia Plan: General   Post-op Pain Management:    Induction: Intravenous  PONV Risk Score and Plan: 0 and Propofol infusion  Airway Management Planned: Natural Airway  Additional Equipment:   Intra-op Plan:   Post-operative Plan:   Informed Consent: I have reviewed the patients History and Physical, chart, labs and discussed the procedure including the risks, benefits and alternatives for the proposed anesthesia with the patient or authorized representative who has indicated his/her  understanding and acceptance.     Dental advisory given  Plan Discussed with: CRNA and Anesthesiologist  Anesthesia Plan Comments:         Anesthesia Quick Evaluation

## 2019-05-15 NOTE — H&P (Signed)
HISTORY AND PHYSICAL INTERVAL NOTE:  05/15/2019  9:30 AM  Richard Mendoza  has presented today for surgery, with the diagnosis of M86.171 ACUTE OSTEOMYELITIS OF RIGHT ANKLE/FOOT.  The various methods of treatment have been discussed with the patient.  No guarantees were given.  After consideration of risks, benefits and other options for treatment, the patient has consented to surgery.  I have reviewed the patients' chart and labs.     A history and physical examination was performed in my office.  The patient was reexamined.  There have been no changes to this history and physical examination.  Samara Deist A

## 2019-05-15 NOTE — Transfer of Care (Signed)
Immediate Anesthesia Transfer of Care Note  Patient: Richard Mendoza  Procedure(s) Performed: OSTECTOMY;MET HEAD 5 (Right Foot)  Patient Location: PACU  Anesthesia Type:General  Level of Consciousness: sedated  Airway & Oxygen Therapy: Patient Spontanous Breathing and Patient connected to face mask oxygen  Post-op Assessment: Report given to RN and Post -op Vital signs reviewed and stable  Post vital signs: Reviewed and stable  Last Vitals:  Vitals Value Taken Time  BP    Temp    Pulse    Resp    SpO2      Last Pain:  Vitals:   05/15/19 0916  TempSrc: Tympanic  PainSc: 7       Patients Stated Pain Goal: 0 (40/76/80 8811)  Complications: No apparent anesthesia complications

## 2019-05-15 NOTE — Op Note (Signed)
Operative note   Surgeon:Leilany Digeronimo Lawyer: None    Preop diagnosis: Osteomyelitis right fifth metatarsal head    Postop diagnosis: Same    Procedure: Excision right fifth metatarsal head    EBL: 10 cc    Anesthesia:local and IV sedation.  Local consisted of a total of 15 cc of 0.5% bupivacaine and 5 cc of lidocaine plain    Hemostasis: Mid calf tourniquet inflated to 200 mmHg for approximately 20 minutes    Specimen: Bone for culture and bone for pathology    Complications: None    Operative indications:Richard Mendoza is an 43 y.o. that presents today for surgical intervention.  The risks/benefits/alternatives/complications have been discussed and consent has been given.    Procedure:  Patient was brought into the OR and placed on the operating table in thesupine position. After anesthesia was obtained theright lower extremity was prepped and draped in usual sterile fashion.  Attention was directed to the lateral aspect of the right foot where the noted ulceration was found at the fifth metatarsal head.  Incision was made proximal distal along the fifth metatarsal phalangeal joint region.  Full-thickness incision taken down to the bone.  At this time the head of the fifth metatarsal was then exposed.  At the level of the surgical neck I then excised the fifth metatarsal head.  Excision was complete.  The proximal margin was inked.  The remaining bone looked to be solid.  A bone culture was performed off the lateral fifth metatarsal head.  All of the soft tissue of the ulceration was then excised.  Primary closure was performed with a 2-0 nylon.  Bleeding was Bovie cauterized and the wound was infiltrated with Surgi-Flo with thrombin.  A bulky sterile dressing was applied.    Patient tolerated the procedure and anesthesia well.  Was transported from the OR to the PACU with all vital signs stable and vascular status intact. To be discharged per routine protocol.  Will follow up  in approximately 1 week in the outpatient clinic.  Prescription for Percocet was sent to the pharmacy.

## 2019-05-15 NOTE — Discharge Instructions (Signed)

## 2019-05-15 NOTE — Anesthesia Procedure Notes (Signed)
Date/Time: 05/15/2019 10:00 AM Performed by: Nelda Marseille, CRNA Pre-anesthesia Checklist: Patient identified, Emergency Drugs available, Suction available, Patient being monitored and Timeout performed Oxygen Delivery Method: Simple face mask

## 2019-05-19 LAB — SURGICAL PATHOLOGY

## 2019-05-20 ENCOUNTER — Telehealth: Payer: Self-pay | Admitting: Infectious Diseases

## 2019-05-20 LAB — AEROBIC/ANAEROBIC CULTURE W GRAM STAIN (SURGICAL/DEEP WOUND): Gram Stain: NONE SEEN

## 2019-05-20 NOTE — Telephone Encounter (Signed)
Spoke to his wife and told her the culture was MRSA and enterococcus and the bone pathology was osteomyelitis- he is currently on bactrim and augmentin- will see Dr.Fowler, and then me tomorrow- Will decide whether he needs IV vancomycin following that

## 2019-05-21 ENCOUNTER — Other Ambulatory Visit: Payer: Self-pay

## 2019-05-21 ENCOUNTER — Encounter: Payer: Self-pay | Admitting: Infectious Diseases

## 2019-05-21 ENCOUNTER — Ambulatory Visit: Payer: Medicaid Other | Attending: Infectious Diseases | Admitting: Infectious Diseases

## 2019-05-21 VITALS — BP 134/72 | HR 92 | Temp 97.9°F | Wt 223.0 lb

## 2019-05-21 DIAGNOSIS — E11621 Type 2 diabetes mellitus with foot ulcer: Secondary | ICD-10-CM

## 2019-05-21 DIAGNOSIS — M869 Osteomyelitis, unspecified: Secondary | ICD-10-CM

## 2019-05-21 DIAGNOSIS — B9689 Other specified bacterial agents as the cause of diseases classified elsewhere: Secondary | ICD-10-CM

## 2019-05-21 DIAGNOSIS — Z89421 Acquired absence of other right toe(s): Secondary | ICD-10-CM

## 2019-05-21 DIAGNOSIS — B9562 Methicillin resistant Staphylococcus aureus infection as the cause of diseases classified elsewhere: Secondary | ICD-10-CM | POA: Diagnosis not present

## 2019-05-21 DIAGNOSIS — E785 Hyperlipidemia, unspecified: Secondary | ICD-10-CM

## 2019-05-21 DIAGNOSIS — F17200 Nicotine dependence, unspecified, uncomplicated: Secondary | ICD-10-CM

## 2019-05-21 DIAGNOSIS — B952 Enterococcus as the cause of diseases classified elsewhere: Secondary | ICD-10-CM | POA: Diagnosis not present

## 2019-05-21 DIAGNOSIS — Z8614 Personal history of Methicillin resistant Staphylococcus aureus infection: Secondary | ICD-10-CM

## 2019-05-21 DIAGNOSIS — Z8619 Personal history of other infectious and parasitic diseases: Secondary | ICD-10-CM

## 2019-05-21 DIAGNOSIS — L97519 Non-pressure chronic ulcer of other part of right foot with unspecified severity: Secondary | ICD-10-CM

## 2019-05-21 DIAGNOSIS — L089 Local infection of the skin and subcutaneous tissue, unspecified: Secondary | ICD-10-CM

## 2019-05-21 DIAGNOSIS — E1169 Type 2 diabetes mellitus with other specified complication: Secondary | ICD-10-CM

## 2019-05-21 DIAGNOSIS — E11628 Type 2 diabetes mellitus with other skin complications: Secondary | ICD-10-CM | POA: Diagnosis not present

## 2019-05-21 DIAGNOSIS — Z792 Long term (current) use of antibiotics: Secondary | ICD-10-CM

## 2019-05-21 DIAGNOSIS — Z794 Long term (current) use of insulin: Secondary | ICD-10-CM

## 2019-05-21 MED ORDER — SULFAMETHOXAZOLE-TRIMETHOPRIM 800-160 MG PO TABS: 1.0000 | ORAL_TABLET | Freq: Two times a day (BID) | ORAL | 1 refills | Status: DC

## 2019-05-21 MED ORDER — AMOXICILLIN-POT CLAVULANATE 875-125 MG PO TABS: 1.0000 | ORAL_TABLET | Freq: Two times a day (BID) | ORAL | 1 refills | Status: DC

## 2019-05-21 NOTE — Patient Instructions (Addendum)
You are here for follow up after a recent foot surgery when you had Excision right fifth metatarsal head on 05/15/19. Culture came back for MRSA and Enterococcus ( same bacteria you had before) The bone pathology had osteo in the margin- You currently are on Bactrim and augmentin and prefer to take that instead of IV vancomycin - we can watch the foot and see how it does in a few days and if improving will continue oral , if not will start IV. Please take a probiotic Get the following labs on Monday when you see Dr.Hande CBC with diff CMP ESR/CRP

## 2019-05-21 NOTE — Progress Notes (Signed)
NAME: Richard Mendoza  DOB: 11-Feb-1976  MRN: [id number]  Date/Time: 05/21/2019 12:20 PM  REQUESTING PROVIDER Subjective:  Pt here for follow He recently had 5th met head resection on 05/15/19 for a chronic wound on the rt foot . He is here to discuss the culture report and bone pathology  ? Richard Mendoza is a 43 y.o. 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 He was discharged home on IV vanco, IV ceftriaoxne until 5/18 and PO flagyl until 03/23/19. Pt had worsening cr and could never attain vanco therapeutic level and it was changed o Daptomycin on 4/29.  HE has been doing well and has completed 33  days of  IV antibiotic on 04/06/19 The surgical site healed but the lateral ulcer persisted and hence he had surgery to remove the bone underneath the ulcer on 05/15/19  Culture is positive for MRSA and enterococcus and pathology shows osteo at the bone margin. Pt is on PO bactrim and augmentin since 05/15/19 and is doing well He has no fever or chills, foot is better he says He saw Dr.[place] today   Medical History  In Jan he was in [facility] between1/7/20-1/11/20 for rt foot infection- Had an abscess says on the plantar aspect which was I/D by Dr.[place] and it was MRSA . He was sent home on vancomycin for 2 weeksfollowing which he took bactrim + rifampin for decolonization. His wife and children also had MRSA and they were told to go their PCP and decoloniz Past Medical History:  Diagnosis Date  . DIABETES MELLITUS, TYPE II, UNCONTROLLED 03/17/2009  . DM 12/08/2008  . HYPERLIPIDEMIA 03/17/2009  . HYPERTENSION 12/08/2008  . 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 ANTERIOR CERVICAL DECOMP/DISCECTOMY FUSION N/A 09/09/2017   Procedure: ANTERIOR CERVICAL DECOMPRESSION/DISCECTOMY FUSION CERVICAL 6- CERVICAL 7;  Surgeon: Ashok Pall, MD;  Location: [place];  Service: Neurosurgery;  Laterality: N/A;  ANTERIOR CERVICAL DECOMPRESSION/DISCECTOMY FUSION CERVICAL 6- CERVICAL 7  . APPENDECTOMY    . I&D EXTREMITY Right 10/03/2017   Procedure: IRRIGATION AND DEBRIDEMENT RIGHT WRIST;  Surgeon: Leanora Cover, MD;  Location: [place];  Service: Orthopedics;  Laterality: Right;  . I&D EXTREMITY Right 11/26/2018   Procedure: IRRIGATION AND DEBRIDEMENT FASCIA ON RIGHT FOOT;  Surgeon: Samara Deist, DPM;  Location: [facility];  Service: Podiatry;  Laterality: Right;  . 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 METATARSAL HEAD EXCISION Right 05/15/2019   Procedure: OSTECTOMY;MET HEAD 5;  Surgeon: Samara Deist, DPM;  Location: [facility];  Service: Podiatry;  Laterality: Right;  . osteomylitis    . ROTATOR CUFF REPAIR Left     [place] Lives with wife and children- has 4 dogs Current smoker  Family History  Problem Relation Age of Onset  . Diabetes Mother   . Heart disease Father   . Diabetes Father   . Arthritis Other   . Hyperlipidemia Other   . Hypertension Other   . Cancer Other        breast   No Known Allergies  ? Current  Outpatient Medications  Medication Sig Dispense Refill  . acetaminophen (TYLENOL) 325 MG tablet Take 2 tablets (650 mg total) by mouth every 6 (six) hours as needed for mild pain (or Fever >/= 101).    Marland Kitchen amoxicillin-clavulanate (AUGMENTIN) 875-125 MG tablet Take 1 tablet by mouth 2 (two) times daily. 14 tablet 0  . atorvastatin (LIPITOR) 20 MG tablet Take 20 mg by mouth daily.  3  . busPIRone (BUSPAR) 15 MG tablet Take 15 mg by mouth 2 (two) times daily.    . [employer] SENNA 8.6 MG tablet  Take 1 tablet by mouth 2 (two) times daily as needed for constipation.   5  . dimenhyDRINATE (DRAMAMINE) 50 MG tablet Take 50 mg by mouth 2 (two) times daily as needed for itching or dizziness.     . docusate sodium (COLACE) 100 MG capsule Take 1 capsule (100 mg total) by mouth 2 (two) times daily as needed for mild constipation. 10 capsule 0  . doxycycline (VIBRA-TABS) 100 MG tablet Take 100 mg by mouth 2 (two) times daily. for 10 days    . empagliflozin (JARDIANCE) 10 MG TABS tablet Take 10 mg by mouth daily.    . famotidine (PEPCID) 20 MG tablet Take 20 mg by mouth 2 (two) times daily.    Marland Kitchen gabapentin (NEURONTIN) 300 MG capsule Take 600 mg by mouth at bedtime.     Marland Kitchen ibuprofen (ADVIL) 200 MG tablet Take 800 mg by mouth every 8 (eight) hours as needed (pain).    . insulin aspart (NOVOLOG) 100 UNIT/ML injection Inject into the skin 3 (three) times daily before meals. Via pump    . Insulin Human (INSULIN PUMP) SOLN Inject 2.5 each into the skin every hour.    . linaclotide (LINZESS) 72 MCG capsule Take 72 mcg by mouth daily as needed (constipation).    Marland Kitchen lisinopril (PRINIVIL,ZESTRIL) 5 MG tablet Take 5 mg by mouth daily.  5  . metoCLOPramide (REGLAN) 10 MG tablet Take 1 tablet (10 mg total) by mouth every 6 (six) hours. (Patient taking differently: Take 10 mg by mouth 2 (two) times daily. ) 30 tablet 0  . ondansetron (ZOFRAN ODT) 4 MG disintegrating tablet Take 1 tablet (4 mg total) by mouth every 8 (eight) hours as needed. 20 tablet 0  . ondansetron (ZOFRAN) 8 MG tablet Take 8 mg by mouth every 6 (six) hours as needed for nausea/vomiting.    Marland Kitchen oxyCODONE-acetaminophen (PERCOCET) 5-325 MG tablet Take 1-2 tablets by mouth every 6 (six) hours as needed for severe pain. 20 tablet 0  . pantoprazole (PROTONIX) 40 MG tablet Take 40 mg by mouth daily.  5  . sertraline (ZOLOFT) 100 MG tablet Take 100 mg by mouth daily.   5  . sildenafil (REVATIO) 20 MG tablet 1-5 pills as needed for ED symptoms (Patient  taking differently: Take 20-100 mg by mouth daily as needed (ED). ) 30 tablet 0  . sulfamethoxazole-trimethoprim (BACTRIM DS) 800-160 MG tablet Take 1 tablet by mouth 2 (two) times daily. 14 tablet 0  . tiZANidine (ZANAFLEX) 4 MG tablet Take 1 tablet (4 mg total) by mouth every 6 (six) hours as needed for muscle spasms. 60 tablet 0  . vitamin B-12 (CYANOCOBALAMIN) 1000 MCG tablet Take 1,000 mcg by mouth daily.    Marland Kitchen dicyclomine (BENTYL) 10 MG capsule Take 1 capsule (10 mg total) by mouth 4 (four) times daily as needed for up to 14 days (abdominal spasms). 30 capsule 0   Current Facility-Administered Medications  Medication Dose Route Frequency Provider Last Rate Last Dose  . DAPTOmycin (CUBICIN) 780 mg in sodium chloride 0.9 % IVPB  780 mg Intravenous Once Leonel Ramsay, MD         Abtx:  Anti-infectives (From admission, onward)   None      REVIEW OF SYSTEMS:  Const: negative fever, negative chills, negative weight loss Eyes: negative diplopia or visual changes, negative eye pain ENT: negative coryza, negative sore throat Resp: negative cough, hemoptysis, dyspnea Cards: negative for chest pain, palpitations, lower extremity edema GU: negative for frequency, dysuria and hematuria GI: Negative for abdominal pain, diarrhea, bleeding, constipation Skin: negative for rash and pruritus Heme: negative for easy bruising and gum/nose bleeding MS: negative for myalgias, arthralgias, back pain and muscle weakness Neurolo:negative for headaches, dizziness, vertigo, memory problems  Psych: negative for feelings of anxiety, depression  Endocrine: no polyuria or polydipsia Allergy/Immunology- negative for any medication or food allergies ? Objective:  VITALS:  BP 134/72 (BP Location: Right Arm, Patient Position: Sitting, Cuff Size: Normal)   Pulse 92   Temp 97.9 F (36.6 C) (Oral)   Wt 223 lb (101.2 kg)   BMI 32.00 kg/m  PHYSICAL EXAM:  General: Alert, cooperative, no distress,  appears stated age.  Head: Normocephalic, without obvious abnormality, atraumatic. Eyes: Conjunctivae clear, anicteric sclerae. Pupils are equal ENT not examined due to mask Neck: Supple,  Lungs: b/l air entry Heart: Regular rate and rhythm, no murmur, rub or gallop. Abdomen: Soft, non-tender,not distended. Bowel sounds normal. No masses Extremities:   05/20/19 ( from patient's phone)      May 2020 ( before surgery)    atraumatic, no cyanosis. No edema. No clubbing Skin: No rashes or lesions. Or bruising Lymph: Cervical, supraclavicular normal. Neurologic: Grossly non-focal Pertinent Labs Labs-none recently    Microbiology: Recent Results (from the past 240 hour(s))  Novel Coronavirus, NAA (hospital order; send-out to ref lab)     Status: None   Collection Time: 05/12/19 12:14 PM   Specimen: Nasopharyngeal Swab; Respiratory  Result Value Ref Range Status   SARS-CoV-2, NAA NOT DETECTED NOT DETECTED Final    Comment: (NOTE) This test was developed and its performance characteristics determined by [employer]. This test has not been FDA cleared or approved. This test has been authorized by FDA under an Emergency Use Authorization (EUA). This test is only authorized for the duration of time the declaration that circumstances exist justifying the authorization of the emergency use of in vitro diagnostic tests for detection of SARS-CoV-2 virus and/or diagnosis of COVID-19 infection under section 564(b)(1) of the Act, 21 U.S.C. 932TFT-7(D)(2), unless the authorization is terminated or revoked sooner. When diagnostic testing is negative, the possibility of a false negative result should be considered in the context of a patient's recent exposures and the presence of clinical signs and symptoms consistent with COVID-19. An individual without symptoms of COVID-19 and who is not shedding SARS-CoV-2 virus would expect to have a negative (not detected) result in this  assay. Performed  At: [facility] [address] [place], [place] [id number] Rush Farmer MD CB:[id number]    [place]  Final    Comment: Performed at [facility], [address] [place], [place] [address]  Aerobic/Anaerobic Culture (surgical/deep wound)     Status: None   Collection Time: 05/15/19 10:05 AM   Specimen: [facility] Other; Tissue  Result Value Ref Range Status   Specimen Description   Final    FOOT BONE Performed at  [facility], [place] [address] [place], [place] [address]    Special Requests   Final    NONE Performed at [facility], [place], [place] [address]    Gram Stain NO WBC SEEN NO ORGANISMS SEEN   Final   Culture   Final    FEW STAPHYLOCOCCUS AUREUS RARE ENTEROCOCCUS FAECALIS RARE STAPHYLOCOCCUS EPIDERMIDIS CRITICAL RESULT CALLED TO, READ BACK BY AND VERIFIED WITH: RN [place] [id number] AT 45 AM BY CM FAXED BY REQUEST TO [id number] NO ANAEROBES ISOLATED Performed at [facility], [place] [address], [place], [place] [address]    Report Status 05/20/2019 FINAL  Final   Organism ID, Bacteria STAPHYLOCOCCUS AUREUS  Final   Organism ID, Bacteria ENTEROCOCCUS FAECALIS  Final   Organism ID, Bacteria STAPHYLOCOCCUS EPIDERMIDIS  Final      Susceptibility   Enterococcus faecalis - MIC*    AMPICILLIN <=2 SENSITIVE Sensitive     VANCOMYCIN 1 SENSITIVE Sensitive     GENTAMICIN SYNERGY SENSITIVE Sensitive     * RARE ENTEROCOCCUS FAECALIS   Staphylococcus aureus - MIC*    CIPROFLOXACIN 2 INTERMEDIATE Intermediate     ERYTHROMYCIN <=0.25 SENSITIVE Sensitive     GENTAMICIN <=0.5 SENSITIVE Sensitive     OXACILLIN >=4 RESISTANT Resistant     TETRACYCLINE >=16 RESISTANT Resistant     VANCOMYCIN 1 SENSITIVE Sensitive     TRIMETH/SULFA <=10 SENSITIVE Sensitive     CLINDAMYCIN <=0.25 SENSITIVE Sensitive     RIFAMPIN <=0.5 SENSITIVE Sensitive     Inducible Clindamycin NEGATIVE Sensitive      * FEW STAPHYLOCOCCUS AUREUS   Staphylococcus epidermidis - MIC*    CIPROFLOXACIN 4 RESISTANT Resistant     ERYTHROMYCIN >=8 RESISTANT Resistant     GENTAMICIN 4 SENSITIVE Sensitive     OXACILLIN >=4 RESISTANT Resistant     TETRACYCLINE >=16 RESISTANT Resistant     VANCOMYCIN 1 SENSITIVE Sensitive     TRIMETH/SULFA 20 SENSITIVE Sensitive     CLINDAMYCIN >=8 RESISTANT Resistant     RIFAMPIN <=0.5 SENSITIVE Sensitive     Inducible Clindamycin NEGATIVE Sensitive     * RARE STAPHYLOCOCCUS EPIDERMIDIS    Impression/Recommendation ? ?Diabetic foot infection Rt with recent 5th met head resection MRSA and enterococcus in the wound culture and pathology shows osteo extending to the proximal margin of resected bone= he is currently on Bactrim and augmentin. He is not interested  in getting IV antibiotic now as he is tired of IV antibiotic and feels that the wound is doing well with oral. He will notify me next week with new pictures and also if the healing stalls   Previous MRSA foot infection- also had osteomyelitis of the 4th toe- s/p 4th ray excision and I/D- was on  VAnco and ceftriaxone to complete 4 weeks until 04/06/19. On 4/29 vanco changed to dapto for increasing cr. Also received 2 weeks of PO flagyl   DM- on insulin, Jardiance  Labs on Monday when he sees his PCP- CBC, ESR, CMP? ___________________________________________________ Discussed with patient,and Dr.[place].

## 2019-05-25 DIAGNOSIS — D649 Anemia, unspecified: Secondary | ICD-10-CM | POA: Insufficient documentation

## 2019-05-25 DIAGNOSIS — D638 Anemia in other chronic diseases classified elsewhere: Secondary | ICD-10-CM | POA: Insufficient documentation

## 2019-05-25 DIAGNOSIS — M869 Osteomyelitis, unspecified: Secondary | ICD-10-CM | POA: Insufficient documentation

## 2019-05-28 ENCOUNTER — Other Ambulatory Visit: Payer: Self-pay | Admitting: Infectious Diseases

## 2019-05-28 MED ORDER — TEDIZOLID PHOSPHATE 200 MG PO TABS: 1.0000 | ORAL_TABLET | Freq: Every day | ORAL | 1 refills | Status: DC

## 2019-06-11 ENCOUNTER — Telehealth: Payer: Self-pay | Admitting: Licensed Clinical Social Worker

## 2019-06-11 NOTE — Telephone Encounter (Signed)
Spoke with Janett Billow, patient's wife and she states that patient is doing well no complaints. Taking the medication everyday. Has a f/u appointment on 7/28 at 10:45. Will also see Dr. Vickki Muff the same day at 9:00am. Patient is expected to have another foot surgery on 7/31

## 2019-06-16 ENCOUNTER — Ambulatory Visit: Payer: Medicaid Other | Attending: Infectious Diseases | Admitting: Infectious Diseases

## 2019-06-16 ENCOUNTER — Other Ambulatory Visit: Payer: Self-pay

## 2019-06-16 ENCOUNTER — Encounter: Payer: Self-pay | Admitting: Infectious Diseases

## 2019-06-16 ENCOUNTER — Other Ambulatory Visit
Admission: RE | Admit: 2019-06-16 | Discharge: 2019-06-16 | Disposition: A | Discharge status: Home / Self Care | Payer: Medicaid Other | Source: Ambulatory Visit | Attending: Infectious Diseases | Admitting: Infectious Diseases

## 2019-06-16 VITALS — BP 136/89 | HR 85 | Temp 97.8°F | Ht 70.0 in | Wt 216.0 lb

## 2019-06-16 DIAGNOSIS — N189 Chronic kidney disease, unspecified: Secondary | ICD-10-CM

## 2019-06-16 DIAGNOSIS — E11628 Type 2 diabetes mellitus with other skin complications: Secondary | ICD-10-CM | POA: Insufficient documentation

## 2019-06-16 DIAGNOSIS — E1122 Type 2 diabetes mellitus with diabetic chronic kidney disease: Secondary | ICD-10-CM

## 2019-06-16 DIAGNOSIS — B9562 Methicillin resistant Staphylococcus aureus infection as the cause of diseases classified elsewhere: Secondary | ICD-10-CM

## 2019-06-16 DIAGNOSIS — L089 Local infection of the skin and subcutaneous tissue, unspecified: Secondary | ICD-10-CM

## 2019-06-16 DIAGNOSIS — E1169 Type 2 diabetes mellitus with other specified complication: Secondary | ICD-10-CM

## 2019-06-16 DIAGNOSIS — E785 Hyperlipidemia, unspecified: Secondary | ICD-10-CM

## 2019-06-16 DIAGNOSIS — F329 Major depressive disorder, single episode, unspecified: Secondary | ICD-10-CM

## 2019-06-16 DIAGNOSIS — I129 Hypertensive chronic kidney disease with stage 1 through stage 4 chronic kidney disease, or unspecified chronic kidney disease: Secondary | ICD-10-CM

## 2019-06-16 DIAGNOSIS — M869 Osteomyelitis, unspecified: Secondary | ICD-10-CM

## 2019-06-16 DIAGNOSIS — B952 Enterococcus as the cause of diseases classified elsewhere: Secondary | ICD-10-CM

## 2019-06-16 DIAGNOSIS — Z794 Long term (current) use of insulin: Secondary | ICD-10-CM

## 2019-06-16 DIAGNOSIS — Z89421 Acquired absence of other right toe(s): Secondary | ICD-10-CM

## 2019-06-16 DIAGNOSIS — F17210 Nicotine dependence, cigarettes, uncomplicated: Secondary | ICD-10-CM

## 2019-06-16 LAB — COMPREHENSIVE METABOLIC PANEL
ALT: 21 U/L (ref 0–44)
AST: 33 U/L (ref 15–41)
Albumin: 4.3 g/dL (ref 3.5–5.0)
Alkaline Phosphatase: 142 U/L — ABNORMAL HIGH (ref 38–126)
Anion gap: 10 (ref 5–15)
BUN: 26 mg/dL — ABNORMAL HIGH (ref 6–20)
CO2: 22 mmol/L (ref 22–32)
Calcium: 9.2 mg/dL (ref 8.9–10.3)
Chloride: 104 mmol/L (ref 98–111)
Creatinine, Ser: 1.37 mg/dL — ABNORMAL HIGH (ref 0.61–1.24)
GFR calc Af Amer: >60 mL/min (ref >60)
GFR calc non Af Amer: >60 mL/min (ref >60)
Glucose, Bld: 190 mg/dL — ABNORMAL HIGH (ref 70–99)
Potassium: 4.1 mmol/L (ref 3.5–5.1)
Sodium: 136 mmol/L (ref 135–145)
Total Bilirubin: 0.8 mg/dL (ref 0.3–1.2)
Total Protein: 7.9 g/dL (ref 6.5–8.1)

## 2019-06-16 LAB — CBC WITH DIFFERENTIAL/PLATELET
Abs Immature Granulocytes: 0.03 10*3/uL (ref 0.00–0.07)
Basophils Absolute: 0.1 10*3/uL (ref 0.0–0.1)
Basophils Relative: 1 %
Eosinophils Absolute: 0.2 10*3/uL (ref 0.0–0.5)
Eosinophils Relative: 2 %
HCT: 38 % — ABNORMAL LOW (ref 39.0–52.0)
Hemoglobin: 12.4 g/dL — ABNORMAL LOW (ref 13.0–17.0)
Immature Granulocytes: 0 %
Lymphocytes Relative: 21 %
Lymphs Abs: 2 10*3/uL (ref 0.7–4.0)
MCH: 26.8 pg (ref 26.0–34.0)
MCHC: 32.6 g/dL (ref 30.0–36.0)
MCV: 82.1 fL (ref 80.0–100.0)
Monocytes Absolute: 0.6 10*3/uL (ref 0.1–1.0)
Monocytes Relative: 7 %
Neutro Abs: 6.4 10*3/uL (ref 1.7–7.7)
Neutrophils Relative %: 69 %
Platelets: 252 10*3/uL (ref 150–400)
RBC: 4.63 MIL/uL (ref 4.22–5.81)
RDW: 14.6 % (ref 11.5–15.5)
WBC: 9.3 10*3/uL (ref 4.0–10.5)
nRBC: 0 % (ref 0.0–0.2)

## 2019-06-16 LAB — SEDIMENTATION RATE: Sed Rate: 34 mm/hr — ABNORMAL HIGH (ref 0–15)

## 2019-06-16 LAB — C-REACTIVE PROTEIN: CRP: 2 mg/dL — ABNORMAL HIGH (ref <1.0)

## 2019-06-16 NOTE — Progress Notes (Signed)
NAMEArmonie Mendoza  DOB: [birth year]  MRN: [id number]  Date/Time: 06/16/2019 11:04 AM   Subjective:  Follow-up visit for his right foot infection. ? Richard Mendoza is a 43 y.o. 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  Pathology showed osteomyelitis with extension of margin.  He got a combination of antibiotics initially vancomycin which was later switched to daptomycin because of worsening creatinine plus ceftriaxone and p.o. Flagyl which she completed for 33 days until 04/06/2019 and was then switched to p.o. Bactrim and Augmentin.  The wound was healing Okay.  On 05/15/2019 had to excision of the right fifth metatarsal head and  culture again was positive for MRSA and enterococcus.  The bone had osteomyelitis.  He is currently getting oral Tidezolid as he wanted to avoid IV antibiotic. The wound has been slowly healing. He is followed by Dr. Vickki M[place]. Past Medical History:  Diagnosis Date  . DIABETES MELLITUS, TYPE II, UNCONTROLLED 03/17/2009  . DM 12/08/2008  . HYPERLIPIDEMIA 03/17/2009  . HYPERTENSION 12/08/2008  . 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 ANTERIOR CERVICAL DECOMP/DISCECTOMY FUSION N/A 09/09/2017   Procedure: ANTERIOR CERVICAL DECOMPRESSION/DISCECTOMY FUSION CERVICAL 6- CERVICAL 7;  Surgeon: Ashok Pall, MD;  Location: [place];  Service: Neurosurgery;  Laterality: N/A;  ANTERIOR CERVICAL DECOMPRESSION/DISCECTOMY FUSION CERVICAL 6- CERVICAL 7  . APPENDECTOMY    . I&D EXTREMITY Right 10/03/2017   Procedure: IRRIGATION AND DEBRIDEMENT RIGHT WRIST;  Surgeon: Leanora Cover, MD;  Location: [place];  Service: Orthopedics;  Laterality: Right;  . I&D  EXTREMITY Right 11/26/2018   Procedure: IRRIGATION AND DEBRIDEMENT FASCIA ON RIGHT FOOT;  Surgeon: Samara Deist, DPM;  Location: [facility];  Service: Podiatry;  Laterality: Right;  . 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 METATARSAL HEAD EXCISION Right 05/15/2019   Procedure: OSTECTOMY;MET HEAD 5;  Surgeon: Samara Deist, DPM;  Location: [facility];  Service: Podiatry;  Laterality: Right;  . osteomylitis    . ROTATOR CUFF REPAIR Left     Social History   Socioeconomic History  . Marital status: Married    Spouse name: Not on file  . Number of children: Not on file  . Years of education: Not on file  . Highest education level: Not on file  Occupational History  . Not on file  Social Needs  . Financial resource strain: Not on file  . Food insecurity    Worry: Not on file    Inability: Not on file  . Transportation needs    Medical: Not on file    Non-medical: Not on file  Tobacco Use  . Smoking status: Current Every Day Smoker    Packs/day: 1.00    Years: 17.00    Pack years: 17.00    Types: Cigarettes  . Smokeless tobacco: Never Used  . Tobacco comment: currently smoking 1 ppd.    Substance and Sexual Activity  . Alcohol use: Yes    Frequency: Never    Comment: rare  . Drug use: No  . Sexual activity: Yes  Lifestyle  . Physical activity  Days per week: Not on file    Minutes per session: Not on file  . Stress: Not on file  Relationships  . Social Herbalist on phone: Not on file    Gets together: Not on file    Attends religious service: Not on file    Active member of club or organization: Not on file    Attends meetings of clubs or organizations: Not on file    Relationship status: Not on file  . Intimate partner violence    Fear of current or ex partner: Not on file    Emotionally  abused: Not on file    Physically abused: Not on file    Forced sexual activity: Not on file  Other Topics Concern  . Not on file  Social History Narrative  . Not on file    Family History  Problem Relation Age of Onset  . Diabetes Mother   . Heart disease Father   . Diabetes Father   . Arthritis Other   . Hyperlipidemia Other   . Hypertension Other   . Cancer Other        breast   No Known Allergies  ?Current medications Atorvastatin BuSpar Jardiance Neurontin Pepcid NovoLog Lantus B12 Sivextro(tedizolid) Sertraline    Abtx:  Anti-infectives (From admission, onward)   None      REVIEW OF SYSTEMS:  Const: negative fever, negative chills, negative weight loss Eyes: negative diplopia or visual changes, negative eye pain ENT: negative coryza, negative sore throat Resp: negative cough, hemoptysis, dyspnea Cards: negative for chest pain, palpitations, lower extremity edema GU: negative for frequency, dysuria and hematuria GI: Negative for abdominal pain, diarrhea, bleeding, constipation Skin: negative for rash and pruritus Heme: negative for easy bruising and gum/nose bleeding MS: negative for myalgias, arthralgias, back pain and muscle weakness Neurolo:negative for headaches, dizziness, vertigo, memory problems  Psych:  depression  Endocrine: Has diabetes Allergy/Immunology- negative for any medication or food allergies  Objective:  VITALS:  BP 136/89 (BP Location: Left Arm, Patient Position: Sitting, Cuff Size: Normal)   Pulse 85   Temp 97.8 F (36.6 C) (Oral)   Ht _0  (1.778 m)   Wt 216 lb (98 kg)   BMI 30.99 kg/m  PHYSICAL EXAM:  General: Alert, cooperative, no distress, appears stated age.  Head: Normocephalic, without obvious abnormality, atraumatic. Eyes: Conjunctivae clear, anicteric sclerae. Pupils are equal ENT Nares normal. No drainage or sinus tenderness. Lips, mucosa, and tongue normal. No Thrush Neck: Supple, symmetrical, no  adenopathy, thyroid: non tender no carotid bruit and no JVD. Back: No CVA tenderness. Lungs: Clear to auscultation bilaterally. No Wheezing or Rhonchi. No rales. Heart: Regular rate and rhythm, no murmur, rub or gallop. Abdomen: Soft, non-tender,not distended. Bowel sounds normal. No masses Extremities: Right foot dressing not removed but looked at pictures he had taken.     On the lateral margin of the right foot there is an ulcerating wound.  It has good granulation tissue in the base.  Cannot see the bone  skin: No rashes or lesions. Or bruising Lymph: Cervical, supraclavicular normal. Neurologic: Grossly non-focal Pertinent Labs Lab Results CBC    Component Value Date/Time   WBC 11.8 (H) 04/20/2019 2146   RBC 4.47 04/20/2019 2146   HGB 12.3 (L) 04/20/2019 2146   HCT 36.7 (L) 04/20/2019 2146   PLT 260 04/20/2019 2146   MCV 82.1 04/20/2019 2146   MCH 27.5 04/20/2019 2146   MCHC 33.5 04/20/2019 2146  RDW 14.4 04/20/2019 2146   LYMPHSABS 1.3 04/20/2019 2146   MONOABS 0.3 04/20/2019 2146   EOSABS 0.0 04/20/2019 2146   BASOSABS 0.0 04/20/2019 2146    CMP Latest Ref Rng & Units 04/20/2019 03/09/2019 03/07/2019  Glucose 70 - 99 mg/dL 174(H) 267(H) 257(H)  BUN 6 - 20 mg/dL 28(H) 25(H) 23(H)  Creatinine 0.61 - 1.24 mg/dL 1.60(H) 1.14 1.16  Sodium 135 - 145 mmol/L 132(L) 138 135  Potassium 3.5 - 5.1 mmol/L 4.4 4.8 4.2  Chloride 98 - 111 mmol/L 99 108 103  CO2 22 - 32 mmol/L 20(L) 24 23  Calcium 8.9 - 10.3 mg/dL 9.7 9.0 8.6(L)  Total Protein 6.5 - 8.1 g/dL 8.4(H) - -  Total Bilirubin 0.3 - 1.2 mg/dL 0.9 - -  Alkaline Phos 38 - 126 U/L 146(H) - -  AST 15 - 41 U/L 32 - -  ALT 0 - 44 U/L 24 - -     ?  Impression/Recommendation ? ?Diabetic foot infection status post fifth metatarsal head amputation on 05/15/2019.  Culture had MRSA and enterococcus.  Patient has been on tedizolid since 05/30/19 to complete 21 days.  Wound is slowly improving.  He saw Dr. Vickki Muff today and  cultures were repeated from the wound. We will do labs today We will decide on further management depending on the labs and culture results.  Discussed the management with Dr. Vickki Muff.  DM- on insulin, Jardiance ? CKD ? ___________________________________________________ Discussed with patient and his wife on the phone note:  This document was prepared using Dragon voice recognition software and may include unintentional dictation errors.

## 2019-06-16 NOTE — Patient Instructions (Signed)
You are here for follow up of the rt foot wound- you are on tedizolid- Today will do labs- will touch base with Dr.[place] regarding culture report and future management

## 2019-06-24 ENCOUNTER — Telehealth: Payer: Self-pay | Admitting: Infectious Diseases

## 2019-06-24 NOTE — Telephone Encounter (Signed)
Spoke to patient regarding the cultre collected at Dr.[place]'s office- MRSA still present in the rt foot wound- pt states the progress is very slow- has completed 21 days of tedizolid ( 7/30/200 and culture from 06/16/19 still  shows MRSA. 06/16/19 labs show ESR 34  CRP 2 Cr 1.37 Pt was on multiple courses of antibiotics before including clinda, bactrim, rifampin, vanco, dapto and tedizolid  He does not want to go back on IV dapto- He says he took once a week dalbavancin in 2018 and it worked for him well- Explained to him that his cr was normal then and now he has 1.37 and there is risk for worsening cr. He understands that and wants to take the risk  go ahead .  dalbavancin 1510m weekly X 2 doses for osteo has been studied in this randomized clinical trial by rappo et al published in [employer] in 2019 and shown to be effective [facility]. Clinical cure was seen in 94% of the people at week 3 compared to 63% with SOC and at 1 year it was 95% VS 88%  Will arrnage to get this infusion at the short stay with monitoring cr in between the 2 doses

## 2019-06-25 ENCOUNTER — Ambulatory Visit
Admission: RE | Admit: 2019-06-25 | Discharge: 2019-06-25 | Disposition: A | Discharge status: Home / Self Care | Payer: Medicaid Other | Source: Ambulatory Visit | Attending: Infectious Diseases | Admitting: Infectious Diseases

## 2019-06-25 ENCOUNTER — Other Ambulatory Visit: Payer: Self-pay

## 2019-06-25 DIAGNOSIS — S91301A Unspecified open wound, right foot, initial encounter: Secondary | ICD-10-CM | POA: Diagnosis not present

## 2019-06-25 DIAGNOSIS — A4902 Methicillin resistant Staphylococcus aureus infection, unspecified site: Secondary | ICD-10-CM | POA: Insufficient documentation

## 2019-06-25 DIAGNOSIS — M869 Osteomyelitis, unspecified: Secondary | ICD-10-CM | POA: Insufficient documentation

## 2019-06-25 MED ORDER — DEXTROSE 5 % IV SOLN
1500.0000 mg | INTRAVENOUS | Status: DC
  Administered 2019-06-25: 1500 mg via INTRAVENOUS
  Filled 2019-06-25: qty 75

## 2019-06-29 ENCOUNTER — Other Ambulatory Visit: Payer: Self-pay | Admitting: Infectious Diseases

## 2019-06-29 ENCOUNTER — Telehealth: Payer: Self-pay | Admitting: Licensed Clinical Social Worker

## 2019-06-29 ENCOUNTER — Encounter: Payer: Self-pay | Admitting: Licensed Clinical Social Worker

## 2019-06-29 DIAGNOSIS — M86271 Subacute osteomyelitis, right ankle and foot: Secondary | ICD-10-CM

## 2019-06-29 NOTE — Telephone Encounter (Signed)
Reviewed pictures- the wound looks very well- no erythema around- some swelling of the foot- please check with patient to see whether he has been on his feet working. He will need follow up labs tomorrow-

## 2019-06-29 NOTE — Telephone Encounter (Signed)
Patient's wife sent mychart message stating that her husband has foot pain and swelling since stopping anitibiotics. She wanted to know what to do.

## 2019-06-30 NOTE — Telephone Encounter (Signed)
Spoke with his wife, she states that he has not been on the foot much at all. [facility] [place] [facility].

## 2019-07-01 ENCOUNTER — Other Ambulatory Visit
Admission: RE | Admit: 2019-07-01 | Discharge: 2019-07-01 | Disposition: A | Discharge status: Home / Self Care | Payer: Medicaid Other | Source: Ambulatory Visit | Attending: Infectious Diseases | Admitting: Infectious Diseases

## 2019-07-01 ENCOUNTER — Telehealth: Payer: Self-pay | Admitting: Infectious Diseases

## 2019-07-01 DIAGNOSIS — M86271 Subacute osteomyelitis, right ankle and foot: Secondary | ICD-10-CM | POA: Insufficient documentation

## 2019-07-01 LAB — CBC WITH DIFFERENTIAL/PLATELET
Abs Immature Granulocytes: 0.02 10*3/uL (ref 0.00–0.07)
Basophils Absolute: 0.1 10*3/uL (ref 0.0–0.1)
Basophils Relative: 1 %
Eosinophils Absolute: 0.2 10*3/uL (ref 0.0–0.5)
Eosinophils Relative: 2 %
HCT: 35.1 % — ABNORMAL LOW (ref 39.0–52.0)
Hemoglobin: 11.4 g/dL — ABNORMAL LOW (ref 13.0–17.0)
Immature Granulocytes: 0 %
Lymphocytes Relative: 20 %
Lymphs Abs: 1.6 10*3/uL (ref 0.7–4.0)
MCH: 27.3 pg (ref 26.0–34.0)
MCHC: 32.5 g/dL (ref 30.0–36.0)
MCV: 84 fL (ref 80.0–100.0)
Monocytes Absolute: 0.5 10*3/uL (ref 0.1–1.0)
Monocytes Relative: 7 %
Neutro Abs: 5.5 10*3/uL (ref 1.7–7.7)
Neutrophils Relative %: 70 %
Platelets: 228 10*3/uL (ref 150–400)
RBC: 4.18 MIL/uL — ABNORMAL LOW (ref 4.22–5.81)
RDW: 14.8 % (ref 11.5–15.5)
WBC: 7.8 10*3/uL (ref 4.0–10.5)
nRBC: 0 % (ref 0.0–0.2)

## 2019-07-01 LAB — COMPREHENSIVE METABOLIC PANEL
ALT: 24 U/L (ref 0–44)
AST: 34 U/L (ref 15–41)
Albumin: 4.1 g/dL (ref 3.5–5.0)
Alkaline Phosphatase: 125 U/L (ref 38–126)
Anion gap: 10 (ref 5–15)
BUN: 28 mg/dL — ABNORMAL HIGH (ref 6–20)
CO2: 21 mmol/L — ABNORMAL LOW (ref 22–32)
Calcium: 9.3 mg/dL (ref 8.9–10.3)
Chloride: 102 mmol/L (ref 98–111)
Creatinine, Ser: 1.62 mg/dL — ABNORMAL HIGH (ref 0.61–1.24)
GFR calc Af Amer: 59 mL/min — ABNORMAL LOW (ref >60)
GFR calc non Af Amer: 51 mL/min — ABNORMAL LOW (ref >60)
Glucose, Bld: 420 mg/dL — ABNORMAL HIGH (ref 70–99)
Potassium: 3.9 mmol/L (ref 3.5–5.1)
Sodium: 133 mmol/L — ABNORMAL LOW (ref 135–145)
Total Bilirubin: 0.7 mg/dL (ref 0.3–1.2)
Total Protein: 7.4 g/dL (ref 6.5–8.1)

## 2019-07-01 LAB — C-REACTIVE PROTEIN: CRP: 2.3 mg/dL — ABNORMAL HIGH (ref <1.0)

## 2019-07-01 LAB — SEDIMENTATION RATE: Sed Rate: 45 mm/hr — ABNORMAL HIGH (ref 0–15)

## 2019-07-01 NOTE — Telephone Encounter (Signed)
Spoke to the patient and his wife and shared the lab results done from today. Blood sugar was 460 Creatinine was 1.62 Patient's wound looks better but the foot is swollen and he was worried about infection.  He is getting IV dalbavancin.  His next dose is due tomorrow.  He will take it and follow-up with Dr. Vickki Muff next week and also with me. Did explain to him his creatinine is fluctuating that could be because of the sugar and antibiotic  and asked him to keep well-hydrated.

## 2019-07-02 ENCOUNTER — Other Ambulatory Visit: Payer: Self-pay

## 2019-07-02 ENCOUNTER — Ambulatory Visit
Admission: RE | Admit: 2019-07-02 | Discharge: 2019-07-02 | Disposition: A | Discharge status: Home / Self Care | Payer: Medicaid Other | Source: Ambulatory Visit | Attending: Infectious Diseases | Admitting: Infectious Diseases

## 2019-07-02 DIAGNOSIS — A4902 Methicillin resistant Staphylococcus aureus infection, unspecified site: Secondary | ICD-10-CM | POA: Diagnosis present

## 2019-07-02 DIAGNOSIS — M869 Osteomyelitis, unspecified: Secondary | ICD-10-CM | POA: Diagnosis not present

## 2019-07-02 MED ORDER — DEXTROSE 5 % IV SOLN: 1500.0000 mg | INTRAVENOUS | Status: DC

## 2019-07-02 MED ORDER — DEXTROSE 5 % IV SOLN
1500.0000 mg | Freq: Once | INTRAVENOUS | Status: AC
  Administered 2019-07-02: 1500 mg via INTRAVENOUS
  Filled 2019-07-02: qty 75

## 2019-07-02 MED ORDER — SODIUM CHLORIDE 0.9 % IV SOLN: 1500.0000 mg | Freq: Every day | INTRAVENOUS | Status: DC

## 2019-07-08 ENCOUNTER — Other Ambulatory Visit: Payer: Self-pay | Admitting: Licensed Clinical Social Worker

## 2019-07-08 ENCOUNTER — Telehealth: Payer: Self-pay | Admitting: Licensed Clinical Social Worker

## 2019-07-08 DIAGNOSIS — M86271 Subacute osteomyelitis, right ankle and foot: Secondary | ICD-10-CM

## 2019-07-08 NOTE — Telephone Encounter (Signed)
Okay, that is fine- he will need to get CBC with diff, CMP and ESR next week- if you can enter those orders when you cll her- thx

## 2019-07-08 NOTE — Telephone Encounter (Signed)
Ok patient will keep taking medication and come in next week for labs.

## 2019-07-08 NOTE — Telephone Encounter (Signed)
Spoke with Janett Billow, patient's wife to see how the patient was doing. She states there was no real improvement in his foot and they were currently at the wound care center. She cancelled appointment with Dr. Vickki Muff. She wanted to know if he could stay on the oral antibiotic tedizolid, patient already picked up refill.

## 2019-07-14 ENCOUNTER — Other Ambulatory Visit: Payer: Self-pay | Admitting: Podiatry

## 2019-07-15 ENCOUNTER — Telehealth: Payer: Self-pay

## 2019-07-15 NOTE — Telephone Encounter (Signed)
Attempted to call patient to review history for tomorrows appt, no answer. Left message on machine to call .

## 2019-07-16 ENCOUNTER — Ambulatory Visit
Payer: Medicaid Other | Attending: Student in an Organized Health Care Education/Training Program | Admitting: Student in an Organized Health Care Education/Training Program

## 2019-07-16 ENCOUNTER — Encounter: Payer: Self-pay | Admitting: Student in an Organized Health Care Education/Training Program

## 2019-07-16 ENCOUNTER — Other Ambulatory Visit: Payer: Self-pay

## 2019-07-16 VITALS — BP 136/85 | HR 84 | Temp 97.9°F | Resp 16 | Ht 70.0 in | Wt 216.0 lb

## 2019-07-16 DIAGNOSIS — G8929 Other chronic pain: Secondary | ICD-10-CM | POA: Insufficient documentation

## 2019-07-16 DIAGNOSIS — E1040 Type 1 diabetes mellitus with diabetic neuropathy, unspecified: Secondary | ICD-10-CM | POA: Diagnosis present

## 2019-07-16 DIAGNOSIS — M75102 Unspecified rotator cuff tear or rupture of left shoulder, not specified as traumatic: Secondary | ICD-10-CM | POA: Diagnosis present

## 2019-07-16 DIAGNOSIS — M25512 Pain in left shoulder: Secondary | ICD-10-CM | POA: Insufficient documentation

## 2019-07-16 DIAGNOSIS — M47812 Spondylosis without myelopathy or radiculopathy, cervical region: Secondary | ICD-10-CM | POA: Diagnosis present

## 2019-07-16 DIAGNOSIS — M5412 Radiculopathy, cervical region: Secondary | ICD-10-CM | POA: Insufficient documentation

## 2019-07-16 DIAGNOSIS — M12812 Other specific arthropathies, not elsewhere classified, left shoulder: Secondary | ICD-10-CM | POA: Insufficient documentation

## 2019-07-16 DIAGNOSIS — M25511 Pain in right shoulder: Secondary | ICD-10-CM

## 2019-07-16 DIAGNOSIS — M19012 Primary osteoarthritis, left shoulder: Secondary | ICD-10-CM | POA: Insufficient documentation

## 2019-07-16 DIAGNOSIS — M19011 Primary osteoarthritis, right shoulder: Secondary | ICD-10-CM | POA: Diagnosis not present

## 2019-07-16 DIAGNOSIS — G894 Chronic pain syndrome: Secondary | ICD-10-CM | POA: Insufficient documentation

## 2019-07-16 DIAGNOSIS — Z981 Arthrodesis status: Secondary | ICD-10-CM | POA: Insufficient documentation

## 2019-07-16 NOTE — Patient Instructions (Signed)
Urine Drug screen today. Psychiatric evaluation PRN supra scapular nerve block.

## 2019-07-16 NOTE — Progress Notes (Signed)
Patient's Name: Richard Mendoza  MRN: [id number]  Referring Provider: Tracie Harrier, MD  DOB: 1975-12-26  PCP: Tracie Harrier, MD  DOS: 07/16/2019  Note by: Gillis Santa, MD  Service setting: Ambulatory outpatient  Specialty: Interventional Pain Management  Location: [facility] (AMB) Pain Management Facility  Visit type: Initial Patient Evaluation  Patient type: New Patient   Primary Reason(s) for Visit: Encounter for initial evaluation of one or more chronic problems (new to examiner) potentially causing chronic pain, and posing a threat to normal musculoskeletal function. (Level of risk: High) CC: Shoulder Pain (bilateral, s/p L shoulder surgery ), Neck Pain (midline), Back Pain (lumbar, worse on the left), Foot Pain (right s/p surgery, still has open wound), and Knee Pain (bilateral)  HPI  Richard Mendoza is a 43 y.o. year old, male patient, who comes today to see [place] for the first time for an initial evaluation of his chronic pain. He has YEAST BALANITIS; DM; HYPERLIPIDEMIA; OBSTRUCTIVE SLEEP APNEA; Essential hypertension; Routine general medical examination at a health care facility; Diabetic neuropathy, type I diabetes mellitus ([place]); HNP (herniated nucleus pulposus), cervical; Intractable nausea and vomiting; Abscess; Diabetic foot infection ([place]); Primary osteoarthritis of both shoulders; 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); Chronic pain of both shoulders; Cervical radicular pain; S/P cervical spinal fusion; Cervical spondylosis; Cervical facet joint syndrome; and Chronic pain syndrome on their problem list. Today he comes in for evaluation of his Shoulder Pain (bilateral, s/p L shoulder surgery ), Neck Pain (midline), Back Pain (lumbar, worse on the left), Foot Pain (right s/p surgery, still has open wound), and Knee Pain (bilateral)  Pain Assessment: Location: Right, Left Shoulder(please see visit info for all pain sites.) Radiating: shoulder pain down both arms to the fingers.  back  goes down left leg into knee Onset: More than a month ago Duration: Chronic pain Quality: Discomfort, Constant, Burning, Aching, Stabbing, Sharp, Shooting Severity: 8 /10 (subjective, self-reported pain score)  Note: Reported level is compatible with observation.                         When using our objective Pain Scale, levels between 6 and 10/10 are said to belong in an emergency room, as it progressively worsens from a 6/10, described as severely limiting, requiring emergency care not usually available at an outpatient pain management facility. At a 6/10 level, communication becomes difficult and requires great effort. Assistance to reach the emergency department may be required. Facial flushing and profuse sweating along with potentially dangerous increases in heart rate and blood pressure will be evident. 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) BP: 136/85  HR: 84  Onset and Duration: Date of onset: 2017 Cause of pain: Surgery Severity: Getting worse, NAS-11 at its worse: 10/10, NAS-11 at its best: 7/10, NAS-11 now: 9/10 and NAS-11 on the average: 9/10 Timing: Not influenced by the time of the day, During activity or exercise, After activity or exercise and After a period of immobility Aggravating Factors: Bending, Climbing, Eating, Kneeling, Lifiting, Motion, Prolonged sitting, Prolonged standing, Squatting, Stooping , Surgery made it worse, Twisting, Walking and Working Alleviating Factors: Medications Associated Problems: Erectile dysfunction, Fatigue, Numbness, Spasms, Swelling, Tingling, Weakness, Pain that wakes patient up and Pain that does not allow patient to sleep Quality of Pain: Aching, Agonizing, Deep, Dreadful, Exhausting, Feeling of constriction, Nagging, Sharp, Shooting, Stabbing, Tender, Throbbing and Tingling Previous Examinations or Tests: Bone  scan, CT scan, MRI scan,  Nerve block, Spinal tap, X-rays and Orthopedic evaluation Previous Treatments: Epidural steroid injections, Narcotic medications and Trigger point injections  The patient comes into the clinics today for the first time for a chronic pain management evaluation.    43 year old male with a history of diabetic neuropathy, on insulin, left rotator cuff repair, bilateral shoulder osteoarthritis, cervical radicular pain, status post cervical spinal fusion presents with a chief complaint of bilateral shoulder pain and neck pain and right foot pain as well.  Patient has had 3 prior foot surgeries for open wound that is nonhealing.  Healing has been further delayed by his diabetic status.  Patient states that his A1c was as high as 14 but is now downtrending.  He is trying to be more compliant with his diabetic management.  Patient was previously being seen by preferred pain.   He was prescribed Percocet 5 mg at that time 3 times daily to 4 times daily as needed.  Of note on 1 of his urine drug screens, oxycodone was not present however he states that he ran out of his medications prior to his urine drug screen as he was having a painful week.  Patient denies any history of substance abuse or addiction.  Patient has tried epidural steroid injections and intra-articular shoulder injections which were not helpful.  He denies having tried suprascapular nerve block.  Of note, patient has sustained kidney injury as a result of high NSAID use to help manage his pain.  He has since discontinued NSAIDs.  Patient does take 600 mg of gabapentin at night.  Today I took the time to provide the patient with information regarding my pain practice. The patient was informed that my practice is divided into two sections: an interventional pain management section, as well as a completely separate and distinct medication management section. I explained that I have procedure days for my interventional therapies, and evaluation days for  follow-ups and medication management. Because of the amount of documentation required during both, they are kept separated. This means that there is the possibility that he may be scheduled for a procedure on one day, and medication management the next. I have also informed him that because of staffing and facility limitations, I no longer take patients for medication management only. To illustrate the reasons for this, I gave the patient the example of surgeons, and how inappropriate it would be to refer a patient to his/her care, just to write for the post-surgical antibiotics on a surgery done by a different surgeon.   Because interventional pain management is my board-certified specialty, the patient was informed that joining my practice means that they are open to any and all interventional therapies. I made it clear that this does not mean that they will be forced to have any procedures done. What this means is that I believe interventional therapies to be essential part of the diagnosis and proper management of chronic pain conditions. Therefore, patients not interested in these interventional alternatives will be better served under the care of a different practitioner.  The patient was also made aware of my Comprehensive Pain Management Safety Guidelines where by joining my practice, they limit all of their nerve blocks and joint injections to those done by our practice, for as long as we are retained to manage their care.   Historic Controlled Substance Pharmacotherapy Review   07/14/2019  1   07/14/2019  Oxycodone-Acetaminophen 5-325  60.00 30 [address]   [id number]  [place] ([id number])   0  15.00 MME  Private Pay   [place]    Medications: The patient did not bring the medication(s) to the appointment, as requested in our "New Patient Package" Pharmacodynamics: Desired effects: Analgesia: The patient reports 50% benefit. Reported improvement in function: The patient reports medication allows him to  accomplish basic ADLs. Clinically meaningful improvement in function (CMIF): Sustained CMIF goals met Perceived effectiveness: Described as relatively effective, allowing for increase in activities of daily living (ADL) Undesirable effects: Side-effects or Adverse reactions: None reported Historical Monitoring: The patient  reports no history of drug use. List of all UDS Test(s): No results found for: MDMA, COCAINSCRNUR, [place], [place], CANNABQUANT, [place], [place] List of other Serum/Urine Drug Screening Test(s):  No results found for: AMPHSCRSER, BARBSCRSER, BENZOSCRSER, COCAINSCRSER, COCAINSCRNUR, PCPSCRSER, PCPQUANT, THCSCRSER, THCU, CANNABQUANT, OPIATESCRSER, OXYSCRSER, PROPOXSCRSER, ETH Historical Background Evaluation: [place] PMP: PDMP not reviewed this encounter. Six (6) year initial data search conducted.             [place] Department of public safety, offender search: Editor, commissioning Information) Non-contributory Risk Assessment Profile: Aberrant behavior: None observed or detected today Risk factors for fatal opioid overdose: None identified today Fatal overdose hazard ratio (HR): Calculation deferred Non-fatal overdose hazard ratio (HR): Calculation deferred Risk of opioid abuse or dependence: 0.7-3.0% with doses ? 36 MME/day and 6.1-26% with doses ? 120 MME/day. Substance use disorder (SUD) risk level: See below Personal History of Substance Abuse (SUD-Substance use disorder):  Alcohol: Negative  Illegal Drugs: Negative  Rx Drugs: Negative  ORT Risk Level calculation: Moderate Risk Opioid Risk Tool - 07/16/19 1004      Family History of Substance Abuse   Alcohol  Negative    Illegal Drugs  Negative    Rx Drugs  Negative      Personal History of Substance Abuse   Alcohol  Negative    Illegal Drugs  Negative    Rx Drugs  Negative      Age   Age between 7-45 years   Yes      Psychological Disease   Psychological Disease  Positive    ADD  Negative    OCD  Negative    Bipolar   Negative    Schizophrenia  Negative    Depression  Positive      Total Score   Opioid Risk Tool Scoring  4    Opioid Risk Interpretation  Moderate Risk      ORT Scoring interpretation table:  Score <3 = Low Risk for SUD  Score between 4-7 = Moderate Risk for SUD  Score >8 = High Risk for Opioid Abuse   PHQ-2 Depression Scale:  Total score:    PHQ-2 Scoring interpretation table: (Score and probability of major depressive disorder)  Score 0 = No depression  Score 1 = 15.4% Probability  Score 2 = 21.1% Probability  Score 3 = 38.4% Probability  Score 4 = 45.5% Probability  Score 5 = 56.4% Probability  Score 6 = 78.6% Probability   PHQ-9 Depression Scale:  Total score:    PHQ-9 Scoring interpretation table:  Score 0-4 = No depression  Score 5-9 = Mild depression  Score 10-14 = Moderate depression  Score 15-19 = Moderately severe depression  Score 20-27 = Severe depression (2.4 times higher risk of SUD and 2.89 times higher risk of overuse)   Pharmacologic Plan: As per protocol, I have not taken over any controlled substance management, pending the results of ordered tests and/or consults.  Initial impression: Pending review of available data and ordered tests.  Meds   Current Outpatient Medications:  .  atorvastatin (LIPITOR) 20 MG tablet, Take 20 mg by mouth daily., Disp: , Rfl: 3 .  busPIRone (BUSPAR) 15 MG tablet, Take 15 mg by mouth 2 (two) times daily., Disp: , Rfl:  .  [employer] SENNA 8.6 MG tablet, Take 1 tablet by mouth 2 (two) times daily as needed for constipation. , Disp: , Rfl: 5 .  dimenhyDRINATE (DRAMAMINE) 50 MG tablet, Take 50 mg by mouth 2 (two) times daily as needed for itching or dizziness. , Disp: , Rfl:  .  docusate sodium (COLACE) 100 MG capsule, Take 1 capsule (100 mg total) by mouth 2 (two) times daily as needed for mild constipation., Disp: 10 capsule, Rfl: 0 .  famotidine (PEPCID) 20 MG tablet, Take 20 mg by mouth 2 (two) times daily., Disp: ,  Rfl:  .  gabapentin (NEURONTIN) 300 MG capsule, Take 600 mg by mouth at bedtime. , Disp: , Rfl:  .  ibuprofen (ADVIL) 200 MG tablet, Take 800 mg by mouth every 8 (eight) hours as needed (pain)., Disp: , Rfl:  .  insulin aspart (NOVOLOG) 100 UNIT/ML injection, Inject into the skin 3 (three) times daily before meals. Via pump, Disp: , Rfl:  .  Insulin Human (INSULIN PUMP) SOLN, Inject 2.5 each into the skin every hour., Disp: , Rfl:  .  linaclotide (LINZESS) 72 MCG capsule, Take 72 mcg by mouth daily as needed (constipation)., Disp: , Rfl:  .  lisinopril (PRINIVIL,ZESTRIL) 5 MG tablet, Take 5 mg by mouth daily., Disp: , Rfl: 5 .  metoCLOPramide (REGLAN) 10 MG tablet, Take 1 tablet (10 mg total) by mouth every 6 (six) hours. (Patient taking differently: Take 10 mg by mouth 2 (two) times daily. ), Disp: 30 tablet, Rfl: 0 .  metoCLOPramide (REGLAN) 10 MG tablet, Take 10 mg by mouth as needed., Disp: , Rfl:  .  ondansetron (ZOFRAN) 8 MG tablet, Take 8 mg by mouth every 6 (six) hours as needed for nausea/vomiting., Disp: , Rfl:  .  pantoprazole (PROTONIX) 40 MG tablet, Take 40 mg by mouth daily., Disp: , Rfl: 5 .  pioglitazone (ACTOS) 30 MG tablet, Take 30 mg by mouth daily., Disp: , Rfl:  .  sertraline (ZOLOFT) 100 MG tablet, Take 100 mg by mouth daily. , Disp: , Rfl: 5 .  Tedizolid Phosphate 200 MG TABS, Take 1 tablet by mouth daily., Disp: 20 tablet, Rfl: 1 .  tiZANidine (ZANAFLEX) 4 MG tablet, Take 1 tablet (4 mg total) by mouth every 6 (six) hours as needed for muscle spasms., Disp: 60 tablet, Rfl: 0 .  vitamin B-12 (CYANOCOBALAMIN) 1000 MCG tablet, Take 1,000 mcg by mouth daily., Disp: , Rfl:  .  empagliflozin (JARDIANCE) 10 MG TABS tablet, Take 10 mg by mouth daily., Disp: , Rfl:   Current Facility-Administered Medications:  .  DAPTOmycin (CUBICIN) 780 mg in sodium chloride 0.9 % IVPB, 780 mg, Intravenous, Once, Leonel Ramsay, MD  Imaging Review   Results for orders placed during the  hospital encounter of 09/09/17  DG Cervical Spine 2-3 Views   Narrative CLINICAL DATA:  Anterior cervical spinal fusion at C6-C7.  EXAM: CERVICAL SPINE - 2-3 VIEW  COMPARISON:  MRI of the cervical spine performed 09/03/2017  FINDINGS: Anterior cervical spinal fusion hardware is partially characterized at C6-C7. Prevertebral soft tissues are not well assessed due to the endotracheal tube. Visualized vertebral bodies are grossly unremarkable  in appearance.  IMPRESSION: Status post anterior cervical spinal fusion at C6-C7.   Electronically Signed   By: Garald Balding M.D.   On: 09/09/2017 22:21     Cervical DG complete:  Results for orders placed during the hospital encounter of 01/19/16  DG Cervical Spine Complete   Narrative CLINICAL DATA:  Left shoulder pain with left arm and finger numbness for the past 3-4 weeks, no known injury. Patient has no back complaints.  EXAM: CERVICAL SPINE - COMPLETE 4+ VIEW  COMPARISON:  None in PACs  FINDINGS: The cervical vertebral bodies are preserved in height. The disc space heights are well maintained. There is no perched facet or spinous process fracture. There is no bony encroachment upon the neural foramina. The odontoid is intact. The prevertebral soft tissue spaces are normal.  IMPRESSION: There is no acute bony abnormality of the cervical spine. No significant disc space narrowing is observed.   Electronically Signed   By: David  Martinique M.D.   On: 01/19/2016 16:30    Lumbar DG (Complete) 4+V:  Results for orders placed during the hospital encounter of 01/24/08  DG Lumbar Spine Complete   Narrative Clinical Data: 43 year-old-male with low back pain.   LUMBAR SPINE - 5 VIEW - 01/24/08:   Comparison: None.   Findings: Normal alignment without compression fracture, wedge-shaped deformity or focal kyphosis. No pars defects. Intact pedicles.   IMPRESSION:  No acute finding.    Provider: Oletha Blend           Complexity Note: Imaging results reviewed. Results shared with Mr. Bright, using Layman's terms.                         ROS  Cardiovascular: High blood pressure Pulmonary or Respiratory: No reported pulmonary signs or symptoms such as wheezing and difficulty taking a deep full breath (Asthma), difficulty blowing air out (Emphysema), coughing up mucus (Bronchitis), persistent dry cough, or temporary stoppage of breathing during sleep Neurological: No reported neurological signs or symptoms such as seizures, abnormal skin sensations, urinary and/or fecal incontinence, being born with an abnormal open spine and/or a tethered spinal cord Review of Past Neurological Studies: No results found for this or any previous visit. Psychological-Psychiatric: Psychiatric disorder Gastrointestinal: Reflux or heatburn and Irregular, infrequent bowel movements (Constipation) Genitourinary: Kidney disease Hematological: No reported hematological signs or symptoms such as prolonged bleeding, low or poor functioning platelets, bruising or bleeding easily, hereditary bleeding problems, low energy levels due to low hemoglobin or being anemic Endocrine: High blood sugar requiring insulin (IDDM) Rheumatologic: Joint aches and or swelling due to excess weight (Osteoarthritis) Musculoskeletal: Negative for myasthenia gravis, muscular dystrophy, multiple sclerosis or malignant hyperthermia Work History: Out of work due to pain  Allergies  Mr. Iodice has No Known Allergies.  Laboratory Chemistry Profile   Screening Lab Results  Component Value Date   SARSCOV2NAA NOT DETECTED 05/12/2019   COVIDSOURCE NASOPHARYNGEAL 05/12/2019   STAPHAUREUS POSITIVE (A) 03/05/2019   MRSAPCR POSITIVE (A) 03/05/2019   HIV Non Reactive 11/25/2018    Inflammation (CRP: Acute Phase) (ESR: Chronic Phase) Lab Results  Component Value Date   CRP 2.3 (H) 07/01/2019   ESRSEDRATE 45 (H) 07/01/2019                          Rheumatology No results found for: RF, ANA, [place], [place], [place], [place], [place]  Renal Lab Results  Component Value Date   BUN 28 (H) 07/01/2019   CREATININE 1.62 (H) 07/01/2019   LABCREA 31 11/28/2018   GFRAA 59 (L) 07/01/2019   GFRNONAA 51 (L) 07/01/2019                             Hepatic Lab Results  Component Value Date   AST 34 07/01/2019   ALT 24 07/01/2019   ALBUMIN 4.1 07/01/2019   ALKPHOS 125 07/01/2019   LIPASE 37 04/20/2019                        Electrolytes Lab Results  Component Value Date   NA 133 (L) 07/01/2019   K 3.9 07/01/2019   CL 102 07/01/2019   CALCIUM 9.3 07/01/2019   MG 2.0 11/29/2018   PHOS 3.5 11/29/2018                        Neuropathy Lab Results  Component Value Date   HGBA1C 11.2 (H) 09/09/2017   HIV Non Reactive 11/25/2018                        CNS No results found for: COLORCSF, APPEARCSF, RBCCOUNTCSF, WBCCSF, POLYSCSF, LYMPHSCSF, EOSCSF, PROTEINCSF, GLUCCSF, JCVIRUS, CSFOLI, IGGCSF, LABACHR, ACETBL                      Bone No results found for: VD25OH, VD125OH2TOT, [id number], [id number], 25OHVITD1, 25OHVITD2, 25OHVITD3, TESTOFREE, TESTOSTERONE                       Coagulation Lab Results  Component Value Date   INR 1.07 11/26/2018   LABPROT 13.8 11/26/2018   PLT 228 07/01/2019   DDIMER  06/23/2007    0.35        AT THE INHOUSE ESTABLISHED CUTOFF VALUE OF 0.48 ug/mL FEU, THIS ASSAY HAS BEEN DOCUMENTED IN THE LITERATURE TO HAVE                        Cardiovascular Lab Results  Component Value Date   TROPONINI <0.03 04/20/2019   HGB 11.4 (L) 07/01/2019   HCT 35.1 (L) 07/01/2019                         ID Lab Results  Component Value Date   HIV Non Reactive 11/25/2018   SARSCOV2NAA NOT DETECTED 05/12/2019   STAPHAUREUS POSITIVE (A) 03/05/2019   MRSAPCR POSITIVE (A) 03/05/2019    Cancer No results found for: CEA, CA125, LABCA2                      Endocrine No  results found for: TSH, FREET4, TESTOFREE, TESTOSTERONE, SHBG, ESTRADIOL, ESTRADIOLPCT, ESTRADIOLFRE, LABPREG, ACTH                      Note: Lab results reviewed.  PFSH  Drug: Mr. Mantione  reports no history of drug use. Alcohol:  reports current alcohol use. Tobacco:  reports that he has quit smoking. His smoking use included cigarettes. He has a 17.00 pack-year smoking history. He has never used smokeless tobacco. Medical:  has a past medical history of DIABETES MELLITUS, TYPE II, UNCONTROLLED (03/17/2009), DM (12/08/2008), HYPERLIPIDEMIA (03/17/2009), HYPERTENSION (12/08/2008), and YEAST BALANITIS (03/17/2009).  Family: family history includes Arthritis in an other family member; Cancer in an other family member; Diabetes in his father and mother; Heart disease in his father; Hyperlipidemia in an other family member; Hypertension in an other family member.  Past Surgical History:  Procedure Laterality Date  . ANTERIOR CERVICAL DECOMP/DISCECTOMY FUSION N/A 09/09/2017   Procedure: ANTERIOR CERVICAL DECOMPRESSION/DISCECTOMY FUSION CERVICAL 6- CERVICAL 7;  Surgeon: Ashok Pall, MD;  Location: [place];  Service: Neurosurgery;  Laterality: N/A;  ANTERIOR CERVICAL DECOMPRESSION/DISCECTOMY FUSION CERVICAL 6- CERVICAL 7  . APPENDECTOMY    . I&D EXTREMITY Right 10/03/2017   Procedure: IRRIGATION AND DEBRIDEMENT RIGHT WRIST;  Surgeon: Leanora Cover, MD;  Location: [place];  Service: Orthopedics;  Laterality: Right;  . I&D EXTREMITY Right 11/26/2018   Procedure: IRRIGATION AND DEBRIDEMENT FASCIA ON RIGHT FOOT;  Surgeon: Samara Deist, DPM;  Location: [facility];  Service: Podiatry;  Laterality: Right;  . 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 METATARSAL HEAD EXCISION Right 05/15/2019   Procedure: OSTECTOMY;MET HEAD 5;  Surgeon: Samara Deist, DPM;   Location: [facility];  Service: Podiatry;  Laterality: Right;  . osteomylitis    . ROTATOR CUFF REPAIR Left    Active Ambulatory Problems    Diagnosis Date Noted  . 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 DM 12/08/2008  . HYPERLIPIDEMIA 03/17/2009  . OBSTRUCTIVE SLEEP APNEA 12/08/2008  . Essential hypertension 12/08/2008  . Routine general medical examination at a health care facility 01/20/2014  . Diabetic neuropathy, type I diabetes mellitus ([place]) 01/20/2014  . HNP (herniated nucleus pulposus), cervical 09/09/2017  . Intractable nausea and vomiting 10/21/2017  . Abscess 11/25/2018  . Diabetic foot infection ([place]) 03/05/2019  . Primary osteoarthritis of both shoulders 07/16/2019  . 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 Chronic pain of both shoulders 07/16/2019  . Cervical radicular pain 07/16/2019  . S/P cervical spinal fusion 07/16/2019  . Cervical spondylosis 07/16/2019  . Cervical facet joint syndrome 07/16/2019  . Chronic pain syndrome 07/16/2019   Resolved Ambulatory Problems    Diagnosis Date Noted  . No Resolved Ambulatory Problems   Past Medical History:  Diagnosis Date  . DIABETES MELLITUS, TYPE II, UNCONTROLLED 03/17/2009  . HYPERTENSION 12/08/2008   Constitutional Exam  General appearance: Well nourished, well developed, and well hydrated. In no apparent acute distress Vitals:   07/16/19 0959 07/16/19 1055  BP: (!) 172/105 136/85  Pulse: 84   Resp: 16   Temp: 97.9 F (36.6 C)   TempSrc: Oral   SpO2: 100%   Weight: 216 lb (98 kg)   Height: 5' 10"  (1.778 m)    BMI Assessment: Estimated body mass index is 30.99 kg/m as calculated from the following:   Height as of this encounter: 5' 10"  (1.778 m).   Weight as of this encounter: 216 lb (98 kg).  BMI interpretation table: BMI level Category Range association with higher incidence of chronic pain  <18 kg/m2 Underweight   18.5-24.9 kg/m2 Ideal body weight   25-29.9 kg/m2 Overweight Increased  incidence by 20%  30-34.9 kg/m2 Obese (Class I) Increased incidence by 68%  35-39.9 kg/m2 Severe obesity (Class II) Increased incidence by 136%  >40 kg/m2 Extreme obesity (Class III) Increased incidence by 254%   Patient's current BMI Ideal Body weight  Body mass index is 30.99 kg/m. Ideal body weight: 73  kg (160 lb 15 oz) Adjusted ideal body weight: 83 kg (182 lb 15.4 oz)   BMI Readings from Last 4 Encounters:  07/16/19 30.99 kg/m  06/16/19 30.99 kg/m  05/21/19 32.00 kg/m  05/15/19 30.71 kg/m   Wt Readings from Last 4 Encounters:  07/16/19 216 lb (98 kg)  06/16/19 216 lb (98 kg)  05/21/19 223 lb (101.2 kg)  05/15/19 214 lb (97.1 kg)  Psych/Mental status: Alert, oriented x 3 (person, place, & time)       Eyes: PERLA Respiratory: No evidence of acute respiratory distress  Cervical Spine Area Exam  Skin & Axial Inspection: Well healed scar from previous spine surgery detected Alignment: Symmetrical Functional ROM: Decreased ROM, bilaterally Stability: No instability detected Muscle Tone/Strength: Functionally intact. No obvious neuro-muscular anomalies detected. Sensory (Neurological): Arthropathic arthralgia Palpation: Complains of area being tender to palpation              Upper Extremity (UE) Exam    Side: Right upper extremity  Side: Left upper extremity  Skin & Extremity Inspection: Skin color, temperature, and hair growth are WNL. No peripheral edema or cyanosis. No masses, redness, swelling, asymmetry, or associated skin lesions. No contractures.  Skin & Extremity Inspection: Evidence of prior arthroplastic surgery  Functional ROM: Decreased ROM          Functional ROM: Mechanically restricted ROM for shoulder  Muscle Tone/Strength: Functionally intact. No obvious neuro-muscular anomalies detected.  Muscle Tone/Strength: Functionally intact. No obvious neuro-muscular anomalies detected.  Sensory (Neurological): Arthropathic arthralgia          Sensory (Neurological):  Arthropathic arthralgia          Palpation: No palpable anomalies              Palpation: Complains of area being tender to palpation              Provocative Test(s):  Phalen's test: deferred Tinel's test: deferred Apley's scratch test (touch opposite shoulder):  Action 1 (Across chest): Decreased ROM Action 2 (Overhead): Decreased ROM Action 3 (LB reach): Decreased ROM   Provocative Test(s):  Phalen's test: deferred Tinel's test: deferred Apley's scratch test (touch opposite shoulder):  Action 1 (Across chest): Decreased ROM Action 2 (Overhead): Decreased ROM Action 3 (LB reach): Decreased ROM    Thoracic Spine Area Exam  Skin & Axial Inspection: No masses, redness, or swelling Alignment: Symmetrical Functional ROM: Unrestricted ROM Stability: No instability detected Muscle Tone/Strength: Functionally intact. No obvious neuro-muscular anomalies detected. Sensory (Neurological): Unimpaired Muscle strength & Tone: No palpable anomalies  Lumbar Spine Area Exam  Skin & Axial Inspection: No masses, redness, or swelling Alignment: Symmetrical Functional ROM: Decreased ROM       Stability: No instability detected Muscle Tone/Strength: Functionally intact. No obvious neuro-muscular anomalies detected. Sensory (Neurological): Musculoskeletal pain pattern and dermatomal Palpation: Complains of area being tender to palpation       Provocative Tests: Hyperextension/rotation test: (+) bilaterally for facet joint pain. Lumbar quadrant test (Kemp's test): deferred today       Lateral bending test: deferred today       Patrick's Maneuver: (+) for bilateral S-I arthralgia             FABER* test: (+) for bilateral S-I arthralgia             S-I anterior distraction/compression test: deferred today         S-I lateral compression test: deferred today         S-I Thigh-thrust test:  deferred today         S-I Gaenslen's test: deferred today         *(Flexion, ABduction and External  Rotation)  Gait & Posture Assessment  Ambulation: Unassisted Gait: Relatively normal for age and body habitus Posture: WNL   Lower Extremity Exam    Side: Right lower extremity  Side: Left lower extremity  Stability: No instability observed          Stability: No instability observed          Skin & Extremity Inspection: Some redness observed gauze in place over right toe due to nonhealing wound  Skin & Extremity Inspection: Skin color, temperature, and hair growth are WNL. No peripheral edema or cyanosis. No masses, redness, swelling, asymmetry, or associated skin lesions. No contractures.  Functional ROM: Decreased ROM for hip and knee joints          Functional ROM: Decreased ROM for hip and knee joints          Muscle Tone/Strength: Functionally intact. No obvious neuro-muscular anomalies detected.  Muscle Tone/Strength: Functionally intact. No obvious neuro-muscular anomalies detected.  Sensory (Neurological): Neuropathic pain pattern and arthropathic        Sensory (Neurological): Neuropathic pain pattern and arthropathic        DTR: Patellar: 1+: trace Achilles: 0: absent Plantar: deferred today  DTR: Patellar: 1+: trace Achilles: 0: absent Plantar: deferred today  Palpation: No palpable anomalies  Palpation: No palpable anomalies   Assessment  Primary Diagnosis & Pertinent Problem List: The primary encounter diagnosis was Primary osteoarthritis of both shoulders. Diagnoses of 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), Chronic pain of both shoulders, Cervical radicular pain, S/P cervical spinal fusion, Diabetic neuropathy, type I diabetes mellitus (HCC), Cervical spondylosis, Cervical facet joint syndrome, and Chronic pain syndrome were also pertinent to this visit.  Visit Diagnosis (New problems to examiner): 1. Primary osteoarthritis of both shoulders   2. 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 Chronic pain of both shoulders   4. Cervical radicular pain    5. S/P cervical spinal fusion   6. Diabetic neuropathy, type I diabetes mellitus ([place])   7. Cervical spondylosis   8. Cervical facet joint syndrome   9. Chronic pain syndrome    Plan of Care (Initial workup plan)  Note: Mr. Norwood was reminded that as per protocol, today's visit has been an evaluation only. We have not taken over the patient's controlled substance management.   Lab Orders     Compliance Drug Analysis, Ur  Referral Orders     Ambulatory referral to Psychology  Procedure Orders     SUPRASCAPULAR NERVE BLOCK  Pharmacological management options:  Opioid Analgesics: The patient was informed that there is no guarantee that he would be a candidate for opioid analgesics. The decision will be made following CDC guidelines. This decision will be based on the results of diagnostic studies, as well as Mr. Buel's risk profile.  Can consider tramadol and/or buprenorphine pending pain psych eval and UDS  Membrane stabilizer: On renally dose gabapentin  Muscle relaxant: To be determined at a later time  NSAID: Medically contraindicated  Other analgesic(s): To be determined at a later time   Interventional management options: Mr. Turpin was informed that there is no guarantee that he would be a candidate for interventional therapies. The decision will be based on the results of diagnostic studies, as well as Mr. Feliz's risk profile.  Procedure(s) under consideration:  PRN  suprascapular nerve block Diagnostic cervical facet medial branch nerve block   Provider-requested follow-up: Return for pt will call to create 2nd visit after Pain Psych eval.  No future appointments.  Primary Care Physician: Tracie Harrier, MD Location: [facility] Note by: Gillis Santa, MD Date: 07/16/2019; Time: 4:24 PM  Note: This dictation was prepared with Dragon dictation. Any transcriptional errors that may result from this process are unintentional.

## 2019-07-16 NOTE — Progress Notes (Signed)
Safety precautions to be maintained throughout the outpatient stay will include: orient to surroundings, keep bed in low position, maintain call bell within reach at all times, provide assistance with transfer out of bed and ambulation.  

## 2019-07-21 LAB — COMPLIANCE DRUG ANALYSIS, UR

## 2019-08-18 ENCOUNTER — Other Ambulatory Visit: Payer: Self-pay

## 2019-08-18 ENCOUNTER — Ambulatory Visit (INDEPENDENT_AMBULATORY_CARE_PROVIDER_SITE_OTHER): Payer: Medicaid Other | Admitting: Psychiatry

## 2019-08-18 ENCOUNTER — Encounter: Payer: Self-pay | Admitting: Psychiatry

## 2019-08-18 DIAGNOSIS — Z8659 Personal history of other mental and behavioral disorders: Secondary | ICD-10-CM

## 2019-08-18 DIAGNOSIS — G894 Chronic pain syndrome: Secondary | ICD-10-CM

## 2019-08-18 DIAGNOSIS — Z008 Encounter for other general examination: Secondary | ICD-10-CM | POA: Diagnosis not present

## 2019-08-18 NOTE — Progress Notes (Signed)
Virtual Visit via Video Note  I connected with Richard Mendoza on 08/18/19 at  9:00 AM EDT by a video enabled telemedicine application and verified that I am speaking with the correct person using two identifiers.   I discussed the limitations of evaluation and management by telemedicine and the availability of in person appointments. The patient expressed understanding and agreed to proceed.   I discussed the assessment and treatment plan with the patient. The patient was provided an opportunity to ask questions and all were answered. The patient agreed with the plan and demonstrated an understanding of the instructions.   The patient was advised to call back or seek an in-person evaluation if the symptoms worsen or if the condition fails to improve as anticipated.    Psychiatric Initial Adult Assessment   Patient Identification: Richard Mendoza MRN:  [id number] Date of Evaluation:  08/18/2019 Referral Source: Dr.Bilal Lateef  Chief Complaint:   Chief Complaint    Psychiatric Evaluation     Visit Diagnosis:    ICD-10-CM   1. Evaluation by psychiatric service required  Z00.8   2. Chronic pain syndrome  G89.4   3. History of depression  Z86.59     History of Present Illness: [name] is a 43 year old Caucasian male, married, currently unemployed, lives in [place], has a history of depression unspecified , shoulder pain(bilateral status post left shoulder surgery), neck pain, back pain, foot pain(right status post surgery, still has open wound), knee pain(bilateral), hypertension, diabetes melitis, neuropathy, obstructive sleep apnea, was evaluated by telemedicine today.  Patient was referred to the clinic for routine assessment of possible mental health/substance abuse risk potential.   Patient reports he has been struggling with pain since the past few years.  He struggles with back pain, knee pain as well as foot pain.  He reports he had surgery to his right foot which has  not healed completely and he has an open wound.  He reports he was on oxycodone for pain in the past however was worried about dependence and hence was tapered down.  He reports he started taking ibuprofen  however he developed kidney disease.  He reports he has currently established care with Dr. Holley Raring for pain management.  Patient reports his pain is severe and it affects his day-to-day living.  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  Patient does report a history of depression.  He reports he struggles with being on edge and irritable when he is under a lot of stress.  He hence was started on Zoloft and BuSpar a year ago by his primary care provider.  Patient currently reports he does not have any significant depression, anxiety or mood swings.  He reports sleep is good.  He denies any suicidality, homicidality or perceptual disturbances.  Patient denies any history of trauma.  Patient denies any substance abuse problems.  Patient reports he has been married to his wife since the past 16 years and that she is supportive.     Associated Signs/Symptoms: Depression Symptoms:  depressed mood, psychomotor agitation, anxiety,currently stable on medications (Hypo) Manic Symptoms:  Denies Anxiety Symptoms:  situational anxiety - currently stable Psychotic Symptoms:  Denies PTSD Symptoms: Negative  Past Psychiatric History: Patient reports a history of being treated for mood symptoms however is unsure about his diagnosis.  He reports he is currently under the care of his primary  care provider and his mood symptoms are stable. Patient denies inpatient mental health admissions.  Patient denies suicide attempts.  Previous Psychotropic Medications: Yes Zoloft, BuSpar Substance Abuse History in the last 12 months:  No.  Consequences of Substance Abuse: Negative  Past Medical History:  Past Medical  History:  Diagnosis Date  . DIABETES MELLITUS, TYPE II, UNCONTROLLED 03/17/2009  . DM 12/08/2008  . HYPERLIPIDEMIA 03/17/2009  . HYPERTENSION 12/08/2008  . 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 ANTERIOR CERVICAL DECOMP/DISCECTOMY FUSION N/A 09/09/2017   Procedure: ANTERIOR CERVICAL DECOMPRESSION/DISCECTOMY FUSION CERVICAL 6- CERVICAL 7;  Surgeon: Ashok Pall, MD;  Location: [facility];  Service: Neurosurgery;  Laterality: N/A;  ANTERIOR CERVICAL DECOMPRESSION/DISCECTOMY FUSION CERVICAL 6- CERVICAL 7  . APPENDECTOMY    . I&D EXTREMITY Right 10/03/2017   Procedure: IRRIGATION AND DEBRIDEMENT RIGHT WRIST;  Surgeon: Leanora Cover, MD;  Location: [facility];  Service: Orthopedics;  Laterality: Right;  . I&D EXTREMITY Right 11/26/2018   Procedure: IRRIGATION AND DEBRIDEMENT FASCIA ON RIGHT FOOT;  Surgeon: Samara Deist, DPM;  Location: [facility];  Service: Podiatry;  Laterality: Right;  . 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 METATARSAL HEAD EXCISION Right 05/15/2019   Procedure: OSTECTOMY;MET HEAD 5;  Surgeon: Samara Deist, DPM;  Location: [facility];  Service: Podiatry;  Laterality: Right;  . osteomylitis    . ROTATOR CUFF REPAIR Left     Family Psychiatric History: Patient denies any history of mental health problems in his family.  Patient denies any substance abuse problems in his family.  Patient denies any suicide in his family. Family History:  Family History  Problem Relation Age of Onset  . Diabetes Mother   . Heart disease Father   . Diabetes Father   . Arthritis Other   . Hyperlipidemia Other   . Hypertension Other   . Cancer Other        breast  . Mental illness Neg Hx     Social History:   Social History   Socioeconomic History  . Marital status: Married    Spouse name:  Not on file  . Number of children: 4  . Years of education: Not on file  . Highest education level: Not on file  Occupational History  . Not on file  Social Needs  . Financial resource strain: Not on file  . Food insecurity    Worry: Not on file    Inability: Not on file  . Transportation needs    Medical: Not on file    Non-medical: Not on file  Tobacco Use  . Smoking status: Former Smoker    Packs/day: 1.00    Years: 17.00    Pack years: 17.00    Types: Cigarettes    Quit date: 01/14/2019    Years since quitting: 0.5  . Smokeless tobacco: Never Used  Substance and Sexual Activity  . Alcohol use: Yes    Frequency: Never    Comment: rare  . Drug use: No  . Sexual activity: Yes  Lifestyle  . Physical activity    Days per week: Not on file    Minutes per session: Not on file  . Stress: Not on file  Relationships  . Social Herbalist on phone: Not on file    Gets together:  Not on file    Attends religious service: Not on file    Active member of club or organization: Not on file    Attends meetings of clubs or organizations: Not on file    Relationship status: Not on file  Other Topics Concern  . Not on file  Social History Narrative  . Not on file    Additional Social History: Patient was raised by both parents.  He graduated high school.  He used to work as an Clinical biochemist.  He is married.  He currently lives with his wife and 4 sons between the age of 6-22.  Patient was in the [employer] for 2 years from 1999-2001.  Patient reports he had an honorable discharge.  Patient currently is unemployed however continues to does odd jobs.  He denies any legal problems.  He denies any history of trauma.  Allergies:  No Known Allergies  Metabolic Disorder Labs: Lab Results  Component Value Date   HGBA1C 11.2 (H) 09/09/2017   MPG 275 09/09/2017   No results found for: PROLACTIN Lab Results  Component Value Date   CHOL 273 (H) 10/24/2011   TRIG 827.0 (H)  10/24/2011   HDL 35.90 (L) 10/24/2011   CHOLHDL 8 10/24/2011   VLDL 165.4 (H) 10/24/2011   No results found for: TSH  Therapeutic Level Labs: No results found for: LITHIUM No results found for: CBMZ No results found for: VALPROATE  Current Medications: Current Outpatient Medications  Medication Sig Dispense Refill  . atorvastatin (LIPITOR) 20 MG tablet Take 20 mg by mouth daily.  3  . busPIRone (BUSPAR) 15 MG tablet Take 15 mg by mouth 2 (two) times daily.    . [employer] SENNA 8.6 MG tablet Take 1 tablet by mouth 2 (two) times daily as needed for constipation.   5  . dimenhyDRINATE (DRAMAMINE) 50 MG tablet Take 50 mg by mouth 2 (two) times daily as needed for itching or dizziness.     . docusate sodium (COLACE) 100 MG capsule Take 1 capsule (100 mg total) by mouth 2 (two) times daily as needed for mild constipation. 10 capsule 0  . famotidine (PEPCID) 20 MG tablet Take 20 mg by mouth 2 (two) times daily.    Marland Kitchen gabapentin (NEURONTIN) 300 MG capsule Take 600 mg by mouth at bedtime.     . insulin aspart (NOVOLOG) 100 UNIT/ML injection Inject into the skin 3 (three) times daily before meals. Via pump    . Insulin Human (INSULIN PUMP) SOLN Inject 2.5 each into the skin every hour.    . linaclotide (LINZESS) 72 MCG capsule Take 72 mcg by mouth daily as needed (constipation).    Marland Kitchen lisinopril (PRINIVIL,ZESTRIL) 5 MG tablet Take 5 mg by mouth daily.  5  . metoCLOPramide (REGLAN) 10 MG tablet Take 10 mg by mouth as needed.    . ondansetron (ZOFRAN) 8 MG tablet Take 8 mg by mouth every 6 (six) hours as needed for nausea/vomiting.    . pantoprazole (PROTONIX) 40 MG tablet Take 40 mg by mouth daily.  5  . pioglitazone (ACTOS) 30 MG tablet Take 30 mg by mouth daily.    . sertraline (ZOLOFT) 100 MG tablet Take 100 mg by mouth daily.   5  . Tedizolid Phosphate 200 MG TABS Take 1 tablet by mouth daily. 20 tablet 1  . tiZANidine (ZANAFLEX) 4 MG tablet Take 1 tablet (4 mg total) by mouth every 6 (six) hours as  needed for muscle spasms. 60 tablet 0  .  vitamin B-12 (CYANOCOBALAMIN) 1000 MCG tablet Take 1,000 mcg by mouth daily.     Current Facility-Administered Medications  Medication Dose Route Frequency Provider Last Rate Last Dose  . DAPTOmycin (CUBICIN) 780 mg in sodium chloride 0.9 % IVPB  780 mg Intravenous Once Leonel Ramsay, MD        Musculoskeletal: Strength & Muscle Tone: Ivy: Observed as seated Patient leans: N/A  Psychiatric Specialty Exam: Review of Systems  Musculoskeletal: Positive for back pain and joint pain.  Psychiatric/Behavioral: Negative for depression, hallucinations, substance abuse and suicidal ideas. The patient is not nervous/anxious and does not have insomnia.   All other systems reviewed and are negative.   There were no vitals taken for this visit.There is no height or weight on file to calculate BMI.  General Appearance: Casual  Eye Contact:  Fair  Speech:  Clear and Coherent  Volume:  Normal  Mood:  Euthymic  Affect:  Congruent  Thought Process:  Goal Directed and Descriptions of Associations: Intact  Orientation:  Full (Time, Place, and Person)  Thought Content:  Logical  Suicidal Thoughts:  No  Homicidal Thoughts:  No  Memory:  Immediate;   Fair Recent;   Fair Remote;   Fair  Judgement:  Fair  Insight:  Fair  Psychomotor Activity:  Normal  Concentration:  Concentration: Fair and Attention Span: Fair  Recall:  [employer] of Knowledge:Fair  Language: Fair  Akathisia:  No  Handed:  Right  AIMS (if indicated):UTA  Assets:  Communication Skills Desire for Improvement Housing Intimacy Social Support  ADL's:  Intact  Cognition: WNL  Sleep:  Fair   Screenings:   Assessment and Plan: [name] is a 43 year old Caucasian male, married, currently unemployed, lives in [place], has a history of depression unspecified, shoulder pain, neck pain, foot pain,, bilateral knee pain, hypertension, diabetes melitis,  neuropathy, obstructive sleep apnea was evaluated by telemedicine today.  Patient was referred to the clinic for routine assessment of possible mental health/substance abuse risk potential.    The following instruments were used Clinical interview Screener and opioid assessment for patients with pain/revised Opioid risk tool Drug abuse screening test Alcohol use disorder identification test PHQ-9 GAD 7  Patient does have a history of depression unspecified however it is likely currently under remission.  Patient completed PHQ-9 during the evaluation today and scored 1.  He is compliant on his medications.  He will continue to follow-up with his primary care provider who is managing it.  Based on clinical interview and instrument used at the time of evaluation the risk is determined to be low to moderate.  I have spent atleast 60 minutes non face to face with patient today. More than 50 % of the time was spent for psychoeducation and supportive psychotherapy and care coordination. This note was generated in part or whole with voice recognition software. Voice recognition is usually quite accurate but there are transcription errors that can and very often do occur. I apologize for any typographical errors that were not detected and corrected.         Ursula Alert, MD 9/29/20209:52 AM

## 2019-09-07 ENCOUNTER — Encounter: Payer: Self-pay | Admitting: Student in an Organized Health Care Education/Training Program

## 2019-09-07 ENCOUNTER — Ambulatory Visit
Payer: Medicaid Other | Attending: Student in an Organized Health Care Education/Training Program | Admitting: Student in an Organized Health Care Education/Training Program

## 2019-09-07 ENCOUNTER — Other Ambulatory Visit: Payer: Self-pay

## 2019-09-07 VITALS — BP 131/90 | HR 92 | Temp 98.6°F | Resp 16 | Ht 70.0 in | Wt 220.0 lb

## 2019-09-07 DIAGNOSIS — E1040 Type 1 diabetes mellitus with diabetic neuropathy, unspecified: Secondary | ICD-10-CM

## 2019-09-07 DIAGNOSIS — M5412 Radiculopathy, cervical region: Secondary | ICD-10-CM | POA: Diagnosis not present

## 2019-09-07 DIAGNOSIS — M47812 Spondylosis without myelopathy or radiculopathy, cervical region: Secondary | ICD-10-CM | POA: Insufficient documentation

## 2019-09-07 DIAGNOSIS — M12812 Other specific arthropathies, not elsewhere classified, left shoulder: Secondary | ICD-10-CM | POA: Insufficient documentation

## 2019-09-07 DIAGNOSIS — M25511 Pain in right shoulder: Secondary | ICD-10-CM | POA: Insufficient documentation

## 2019-09-07 DIAGNOSIS — Z0289 Encounter for other administrative examinations: Secondary | ICD-10-CM | POA: Diagnosis present

## 2019-09-07 DIAGNOSIS — M75102 Unspecified rotator cuff tear or rupture of left shoulder, not specified as traumatic: Secondary | ICD-10-CM

## 2019-09-07 DIAGNOSIS — Z981 Arthrodesis status: Secondary | ICD-10-CM | POA: Insufficient documentation

## 2019-09-07 DIAGNOSIS — M19012 Primary osteoarthritis, left shoulder: Secondary | ICD-10-CM | POA: Diagnosis present

## 2019-09-07 DIAGNOSIS — M25512 Pain in left shoulder: Secondary | ICD-10-CM | POA: Diagnosis present

## 2019-09-07 DIAGNOSIS — M19011 Primary osteoarthritis, right shoulder: Secondary | ICD-10-CM | POA: Insufficient documentation

## 2019-09-07 DIAGNOSIS — G894 Chronic pain syndrome: Secondary | ICD-10-CM | POA: Insufficient documentation

## 2019-09-07 DIAGNOSIS — G8929 Other chronic pain: Secondary | ICD-10-CM | POA: Insufficient documentation

## 2019-09-07 MED ORDER — BELBUCA 300 MCG BU FILM: 1.0000 | ORAL_FILM | Freq: Two times a day (BID) | BUCCAL | 0 refills | Status: DC

## 2019-09-07 NOTE — Progress Notes (Signed)
Safety precautions to be maintained throughout the outpatient stay will include: orient to surroundings, keep bed in low position, maintain call bell within reach at all times, provide assistance with transfer out of bed and ambulation.  

## 2019-09-07 NOTE — Patient Instructions (Addendum)
[place] to last until 10/07/19 has been escribed to your pharmacy.   Moderate Conscious Sedation, Adult Sedation is the use of medicines to promote relaxation and relieve discomfort and anxiety. Moderate conscious sedation is a type of sedation. Under moderate conscious sedation, you are less alert than normal, but you are still able to respond to instructions, touch, or both. Moderate conscious sedation is used during short medical and dental procedures. It is milder than deep sedation, which is a type of sedation under which you cannot be easily woken up. It is also milder than general anesthesia, which is the use of medicines to make you unconscious. Moderate conscious sedation allows you to return to your regular activities sooner. Tell a health care provider about:  Any allergies you have.  All medicines you are taking, including vitamins, herbs, eye drops, creams, and over-the-counter medicines.  Use of steroids (by mouth or creams).  Any problems you or family members have had with sedatives and anesthetic medicines.  Any blood disorders you have.  Any surgeries you have had.  Any medical conditions you have, such as sleep apnea.  Whether you are pregnant or may be pregnant.  Any use of cigarettes, alcohol, marijuana, or street drugs. What are the risks? Generally, this is a safe procedure. However, problems may occur, including:  Getting too much medicine (oversedation).  Nausea.  Allergic reaction to medicines.  Trouble breathing. If this happens, a breathing tube may be used to help with breathing. It will be removed when you are awake and breathing on your own.  Heart trouble.  Lung trouble. What happens before the procedure? Staying hydrated Follow instructions from your health care provider about hydration, which may include:  Up to 2 hours before the procedure - you may continue to drink clear liquids, such as water, clear fruit juice, black coffee, and plain  tea. Eating and drinking restrictions Follow instructions from your health care provider about eating and drinking, which may include:  8 hours before the procedure - stop eating heavy meals or foods such as meat, fried foods, or fatty foods.  6 hours before the procedure - stop eating light meals or foods, such as toast or cereal.  6 hours before the procedure - stop drinking milk or drinks that contain milk.  2 hours before the procedure - stop drinking clear liquids. Medicine Ask your health care provider about:  Changing or stopping your regular medicines. This is especially important if you are taking diabetes medicines or blood thinners.  Taking medicines such as aspirin and ibuprofen. These medicines can thin your blood. Do not take these medicines before your procedure if your health care provider instructs you not to.  Tests and exams  You will have a physical exam.  You may have blood tests done to show: ? How well your kidneys and liver are working. ? How well your blood can clot. General instructions  Plan to have someone take you home from the hospital or clinic.  If you will be going home right after the procedure, plan to have someone with you for 24 hours. What happens during the procedure?  An IV tube will be inserted into one of your veins.  Medicine to help you relax (sedative) will be given through the IV tube.  The medical or dental procedure will be performed. What happens after the procedure?  Your blood pressure, heart rate, breathing rate, and blood oxygen level will be monitored often until the medicines you were given have  worn off.  Do not drive for 24 hours. This information is not intended to replace advice given to you by your health care provider. Make sure you discuss any questions you have with your health care provider. Document Released: 07/31/2001 Document Revised: 10/18/2017 Document Reviewed: 02/25/2016 Elsevier Patient Education   2020 [employer] GENERAL RISKS AND COMPLICATIONS  What are the risk, side effects and possible complications? Generally speaking, most procedures are safe.  However, with any procedure there are risks, side effects, and the possibility of complications.  The risks and complications are dependent upon the sites that are lesioned, or the type of nerve block to be performed.  The closer the procedure is to the spine, the more serious the risks are.  Great care is taken when placing the radio frequency needles, block needles or lesioning probes, but sometimes complications can occur. 1. Infection: Any time there is an injection through the skin, there is a risk of infection.  This is why sterile conditions are used for these blocks.  There are four possible types of infection. 1. Localized skin infection. 2. Central Nervous System Infection-This can be in the form of Meningitis, which can be deadly. 3. Epidural Infections-This can be in the form of an epidural abscess, which can cause pressure inside of the spine, causing compression of the spinal cord with subsequent paralysis. This would require an emergency surgery to decompress, and there are no guarantees that the patient would recover from the paralysis. 4. Discitis-This is an infection of the intervertebral discs.  It occurs in about 1% of discography procedures.  It is difficult to treat and it may lead to surgery.        2. Pain: the needles have to go through skin and soft tissues, will cause soreness.       3. Damage to internal structures:  The nerves to be lesioned may be near blood vessels or    other nerves which can be potentially damaged.       4. Bleeding: Bleeding is more common if the patient is taking blood thinners such as  aspirin, Coumadin, Ticiid, Plavix, etc., or if he/she have some genetic predisposition  such as hemophilia. Bleeding into the spinal canal can cause compression of the spinal  cord with subsequent paralysis.   This would require an emergency surgery to  decompress and there are no guarantees that the patient would recover from the  paralysis.       5. Pneumothorax:  Puncturing of a lung is a possibility, every time a needle is introduced in  the area of the chest or upper back.  Pneumothorax refers to free air around the  collapsed lung(s), inside of the thoracic cavity (chest cavity).  Another two possible  complications related to a similar event would include: Hemothorax and Chylothorax.   These are variations of the Pneumothorax, where instead of air around the collapsed  lung(s), you may have blood or chyle, respectively.       6. Spinal headaches: They may occur with any procedures in the area of the spine.       7. Persistent CSF (Cerebro-Spinal Fluid) leakage: This is a rare problem, but may occur  with prolonged intrathecal or epidural catheters either due to the formation of a fistulous  track or a dural tear.       8. Nerve damage: By working so close to the spinal cord, there is always a possibility of  nerve damage, which could be as  serious as a permanent spinal cord injury with  paralysis.       9. Death:  Although rare, severe deadly allergic reactions known as "Anaphylactic  reaction" can occur to any of the medications used.      10. Worsening of the symptoms:  We can always make thing worse.  What are the chances of something like this happening? Chances of any of this occuring are extremely low.  By statistics, you have more of a chance of getting killed in a motor vehicle accident: while driving to the hospital than any of the above occurring .  Nevertheless, you should be aware that they are possibilities.  In general, it is similar to taking a shower.  Everybody knows that you can slip, hit your head and get killed.  Does that mean that you should not shower again?  Nevertheless always keep in mind that statistics do not mean anything if you happen to be on the wrong side of them.  Even if  a procedure has a 1 (one) in a 1,000,000 (million) chance of going wrong, it you happen to be that one..Also, keep in mind that by statistics, you have more of a chance of having something go wrong when taking medications.  Who should not have this procedure? If you are on a blood thinning medication (e.g. Coumadin, Plavix, see list of "Blood Thinners"), or if you have an active infection going on, you should not have the procedure.  If you are taking any blood thinners, please inform your physician.  How should I prepare for this procedure?  Do not eat or drink anything at least six hours prior to the procedure.  Bring a driver with you .  It cannot be a taxi.  Come accompanied by an adult that can drive you back, and that is strong enough to help you if your legs get weak or numb from the local anesthetic.  Take all of your medicines the morning of the procedure with just enough water to swallow them.  If you have diabetes, make sure that you are scheduled to have your procedure done first thing in the morning, whenever possible.  If you have diabetes, take only half of your insulin dose and notify our nurse that you have done so as soon as you arrive at the clinic.  If you are diabetic, but only take blood sugar pills (oral hypoglycemic), then do not take them on the morning of your procedure.  You may take them after you have had the procedure.  Do not take aspirin or any aspirin-containing medications, at least eleven (11) days prior to the procedure.  They may prolong bleeding.  Wear loose fitting clothing that may be easy to take off and that you would not mind if it got stained with Betadine or blood.  Do not wear any jewelry or perfume  Remove any nail coloring.  It will interfere with some of our monitoring equipment.  NOTE: Remember that this is not meant to be interpreted as a complete list of all possible complications.  Unforeseen problems may occur.  BLOOD THINNERS The  following drugs contain aspirin or other products, which can cause increased bleeding during surgery and should not be taken for 2 weeks prior to and 1 week after surgery.  If you should need take something for relief of minor pain, you may take acetaminophen which is found in Tylenol,m Datril, Anacin-3 and Panadol. It is not blood thinner. The products listed below are.  Do not take any of the products listed below in addition to any listed on your instruction sheet.  A.P.C or A.P.C with Codeine Codeine Phosphate Capsules #3 Ibuprofen Ridaura  ABC compound Congesprin Imuran rimadil  Advil Cope Indocin Robaxisal  Alka-Seltzer Effervescent Pain Reliever and Antacid Coricidin or Coricidin-D  Indomethacin Rufen  Alka-Seltzer plus Cold Medicine Cosprin Ketoprofen S-A-C Tablets  Anacin Analgesic Tablets or Capsules Coumadin Korlgesic Salflex  Anacin Extra Strength Analgesic tablets or capsules CP-2 Tablets Lanoril Salicylate  Anaprox Cuprimine Capsules Levenox Salocol  Anexsia-D Dalteparin Magan Salsalate  Anodynos Darvon compound Magnesium Salicylate Sine-off  Ansaid Dasin Capsules Magsal Sodium Salicylate  Anturane Depen Capsules Marnal Soma  APF Arthritis pain formula Dewitt's Pills Measurin Stanback  Argesic Dia-Gesic Meclofenamic Sulfinpyrazone  Arthritis Bayer Timed Release Aspirin Diclofenac Meclomen Sulindac  Arthritis pain formula Anacin Dicumarol Medipren Supac  Analgesic (Safety coated) Arthralgen Diffunasal Mefanamic Suprofen  Arthritis Strength Bufferin Dihydrocodeine Mepro Compound Suprol  Arthropan liquid Dopirydamole Methcarbomol with Aspirin Synalgos  ASA tablets/Enseals Disalcid Micrainin Tagament  Ascriptin Doan's Midol Talwin  Ascriptin A/D Dolene Mobidin Tanderil  Ascriptin Extra Strength Dolobid Moblgesic Ticlid  Ascriptin with Codeine Doloprin or Doloprin with Codeine Momentum Tolectin  Asperbuf Duoprin Mono-gesic Trendar  Aspergum Duradyne Motrin or Motrin IB Triminicin   Aspirin plain, buffered or enteric coated Durasal Myochrisine Trigesic  Aspirin Suppositories Easprin Nalfon Trillsate  Aspirin with Codeine Ecotrin Regular or Extra Strength Naprosyn Uracel  Atromid-S Efficin Naproxen Ursinus  Auranofin Capsules Elmiron Neocylate Vanquish  Axotal Emagrin Norgesic Verin  Azathioprine Empirin or Empirin with Codeine Normiflo Vitamin E  Azolid Emprazil Nuprin Voltaren  Bayer Aspirin plain, buffered or children's or timed BC Tablets or powders Encaprin Orgaran Warfarin Sodium  Buff-a-Comp Enoxaparin Orudis Zorpin  Buff-a-Comp with Codeine Equegesic Os-Cal-Gesic   Buffaprin Excedrin plain, buffered or Extra Strength Oxalid   Bufferin Arthritis Strength Feldene Oxphenbutazone   Bufferin plain or Extra Strength Feldene Capsules Oxycodone with Aspirin   Bufferin with Codeine Fenoprofen Fenoprofen Pabalate or Pabalate-SF   Buffets II Flogesic Panagesic   Buffinol plain or Extra Strength Florinal or Florinal with Codeine Panwarfarin   Buf-Tabs Flurbiprofen Penicillamine   Butalbital Compound Four-way cold tablets Penicillin   Butazolidin Fragmin Pepto-Bismol   Carbenicillin Geminisyn Percodan   Carna Arthritis Reliever Geopen Persantine   Carprofen Gold's salt Persistin   Chloramphenicol Goody's Phenylbutazone   Chloromycetin Haltrain Piroxlcam   Clmetidine heparin Plaquenil   Cllnoril Hyco-pap Ponstel   Clofibrate Hydroxy chloroquine Propoxyphen         Before stopping any of these medications, be sure to consult the physician who ordered them.  Some, such as Coumadin (Warfarin) are ordered to prevent or treat serious conditions such as "deep thrombosis", "pumonary embolisms", and other heart problems.  The amount of time that you may need off of the medication may also vary with the medication and the reason for which you were taking it.  If you are taking any of these medications, please make sure you notify your pain physician before you undergo any  procedures.          Suprascapular Nerve Entrapment Suprascapular nerve entrapment is pressure or squeezing (entrapment) of the suprascapular nerve. This nerve provides feeling to the muscles near the shoulder blade (scapula) and shoulder joint. This nerve begins in the spinal cord in the neck and passes down the front of the neck, behind the collarbone (clavicle), and over the back of the scapula. Nerve entrapment can happen anywhere along the nerve, but  it commonly happens:  In the neck, near where the nerve leaves the spinal cord.  At the top of the scapula, under a band of tissue that connects bones to each other (ligament).  Behind the scapula, where the nerve passes through a narrow area. This condition is common among athletes who often raise their arms overhead and rotate their shoulders outward, which is why it is sometimes called volleyball shoulder. Suprascapular nerve entrapment can lead to nerve damage in the shoulder (neuropathy). What are the causes?  This condition is commonly caused by narrowing of the tissues around the suprascapular nerve. This may happen gradually from repeatedly making overhead arm motions or frequently carrying a heavy backpack. It may also result from a sudden (acute) injury such as a fall or getting hit in the area where the nerve is. Less commonly, this condition may be caused by a fluid-filled lump growing near the nerve (ganglion cyst) that causes the nerve to swell. What increases the risk? You are more likely to develop this condition if:  You are a young, male athlete.  You participate in sports that involve overhead arm movements. These sports include: ? Volleyball. ? Baseball. ? Tennis. ? Weight lifting. What are the signs or symptoms? Symptoms of this condition include:  Pain. This may feel like a dull ache, and it may get worse with overhead arm movements.  Weakness.  Decreased range of motion. How is this diagnosed? This  condition is diagnosed based on:  Your symptoms.  Your medical history, including your history of recent injuries.  A physical exam to test your muscle strength and range of motion.  Your body's response to a shot (injection) of numbing medicine in your shoulder. This may be done to see if the medicine helps relieve your shoulder pain.  Tests, such as: ? Electromyogram (EMG). This is an electrical nerve study that is used to find out which nerve and muscles are affected. ? X-rays. ? MRI. ? Ultrasound. This test uses sound waves to take pictures of the inside of the body. How is this treated? Treatment for this condition may include:  Avoiding activities that cause pain.  Taking medicines to help relieve pain.  Doing physical therapy.  Having one or more injections of a numbing medicine (steroid) to help reduce inflammation and pain.  Having surgery to remove a ganglion cyst or enlarge a narrow area. Surgery may be needed if your condition does not improve after trying other treatment methods. Follow these instructions at home: Medicines  Take over-the-counter and prescription medicines only as told by your health care provider.  Ask your health care provider if the medicine prescribed to you requires you to avoid driving or using heavy machinery. Activity  Return to your normal activities as told by your health care provider. Ask your health care provider what activities are safe for you.  Do not lift anything that is heavier than 10 lb (4.5 kg), or the limit that you are told, until your health care provider says that it is safe.  Ask your health care provider when it is safe for you to drive.  Do exercises as told by your health care provider. General instructions  Do not use any products that contain nicotine or tobacco, such as cigarettes, e-cigarettes, and chewing tobacco. These can delay healing. If you need help quitting, ask your health care provider.  Keep all  follow-up visits as told by your health care provider. This is important. How is this prevented?  Warm up and stretch before being active.  Cool down and stretch after being active.  Give your body time to rest between periods of activity.  Be safe and responsible while being active. This will help you to avoid falls.  Maintain physical fitness, including strength and flexibility. Contact a health care provider if:  You have pain that gets worse or does not get better with medicine.  Your symptoms get worse or do not go away after 6 months of treatment. Summary  Suprascapular nerve entrapment is pressure or squeezing (entrapment) of the nerve that provides feeling to the muscles near the shoulder blade and shoulder joint.  This condition is common among athletes who often raise their arms overhead and rotate their shoulders outward.  This condition may be caused by overuse, sudden injury, or the presence of a fluid-filled lump growing near the nerve (ganglion cyst).  Treatment may include rest, medicines, physical therapy, and surgery if needed. This information is not intended to replace advice given to you by your health care provider. Make sure you discuss any questions you have with your health care provider. Document Released: 11/05/2005 Document Revised: 01/12/2019 Document Reviewed: 01/14/2019 Elsevier Patient Education  2020 [employer].

## 2019-09-07 NOTE — Progress Notes (Signed)
Patient's Name: Richard Mendoza  MRN: [id number]  Referring Provider: Tracie Harrier, MD  DOB: August 21, 1976  PCP: Tracie Harrier, MD  DOS: 09/07/2019  Note by: Gillis Santa, MD  Service setting: Ambulatory outpatient  Attending: Gillis Santa, MD  Location: [facility] (AMB) Pain Management Facility  Specialty: Interventional Pain Management  Patient type: Established   Primary Reason(s) for Visit: Encounter for evaluation before starting new chronic pain management plan of care (Level of risk: moderate) CC: Shoulder Pain (bilateral, right is worse)  HPI  Richard Mendoza is a 43 y.o. year old, male patient, who comes today for a follow-up evaluation to review the test results and decide on a treatment plan. 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); Chronic pain of both shoulders; Cervical radicular pain; S/P cervical spinal fusion; Cervical spondylosis; Cervical facet joint syndrome; Chronic pain syndrome; Evaluation by psychiatric service required; Allergic rhinitis; Anemia; Cellulitis of right leg; Osteomyelitis of right foot ([place]); Diabetic peripheral neuropathy associated with type 2 diabetes mellitus ([place]); DM type 2 with diabetic peripheral neuropathy ([place]); Erectile dysfunction; Gastroesophageal reflux disease; Hyperlipidemia associated with type 2 diabetes mellitus ([place]); Rotator cuff syndrome of left shoulder; Tobacco abuse disorder; History of depression; and Pain management contract signed on their problem list. His primarily concern today is the Shoulder Pain (bilateral, right is worse)  Pain Assessment: Location: Right, Left  Shoulder Radiating: down to elbows, bilaterally Onset: More than a month ago Duration: Chronic pain Quality: Constant, Sharp Severity: 8 /10 (subjective, self-reported pain score)  Note: Reported level is compatible with observation.  Effect on ADL: limits daily activities Timing: Constant(constant; ranges from 6-10) Modifying factors: pain meds ease pain BP: 131/90  HR: 92  Richard Mendoza comes in today for a follow-up visit after his initial evaluation on 07/16/2019. Today we went over the results of his tests. These were explained in "Layman's terms". During today's appointment we went over my diagnostic impression, as well as the proposed treatment plan.  No change in medical history since the patient's first visit with me.  He was able to see psychiatry and is deemed low to moderate risk for substance abuse potential.  Patient continues to endorse bilateral, right more than left shoulder pain.  He has had intra-articular steroid injections in the past which were not effective.  We had discussed right suprascapular nerve block in the past and the patient would like to consider that.  In considering the treatment plan options, Richard Mendoza was reminded that I no longer take patients for medication management only. I asked him to let me know if he had no intention of taking advantage of the interventional therapies, so that we could make arrangements to provide this space to someone interested. I also made it clear that undergoing interventional therapies for the purpose of getting pain medications is very inappropriate on the part of a patient, and it will not be tolerated in this practice. This type of behavior would suggest true addiction and therefore it requires referral to an addiction specialist.   Further details on both, my assessment(s), as well as the proposed treatment plan, please see below.  Controlled Substance Pharmacotherapy Assessment REMS (Risk Evaluation and Mitigation Strategy)   Analgesic:  08/11/2019  1   08/11/2019  Oxycodone-Acetaminophen 5-325  60.00  30 Vi Han   [id number]   Nor (2372)   0  15.00 MME  Medicaid   [place]    This was being prescribed by his primary care provider.  I recommend patient consider buprenorphine which is a milder acting opioid analgesic with less side effect profile and less dependence potential.  Pill Count: None expected due to no prior prescriptions written by our practice. Rise Patience, RN  09/07/2019  2:30 PM  Signed Safety precautions to be maintained throughout the outpatient stay will include: orient to surroundings, keep bed in low position, maintain call bell within reach at all times, provide assistance with transfer out of bed and ambulation.    Pharmacokinetics: Liberation and absorption (onset of action): WNL Distribution (time to peak effect): WNL Metabolism and excretion (duration of action): WNL         Pharmacodynamics: Desired effects: Analgesia: Richard Mendoza reports <50% benefit. Functional ability: Patient reports that medication allows him to accomplish basic ADLs Clinically meaningful improvement in function (CMIF): Sustained CMIF goals met Perceived effectiveness: Described as relatively effective but with some room for improvement Undesirable effects: Side-effects or Adverse reactions: None reported Monitoring: [place] PMP: PDMP not reviewed this encounter. Online review of the past 42-monthperiod previously conducted. Not applicable at this point since we have not taken over the patient's medication management yet. List of other Serum/Urine Drug Screening Test(s):  No results found for: AMPHSCRSER, BARBSCRSER, BENZOSCRSER, COCAINSCRSER, COCAINSCRNUR, PSpelter TWood TSchoolcraft CANNABQUANT, OElkins OHarwood PFolsom ESouth PekinList of all UDS test(s) done:  Lab Results  Component Value Date   SUMMARY Note 07/16/2019   Last UDS on record: Summary  Date Value Ref Range Status  07/16/2019 Note  Final     Comment:    ==================================================================== Compliance Drug Analysis, Ur ==================================================================== Test                             Result       Flag       Units Drug Present and Declared for Prescription Verification   Sertraline                     PRESENT      EXPECTED   Desmethylsertraline            PRESENT      EXPECTED    Desmethylsertraline is an expected metabolite of sertraline. Drug Present not Declared for Prescription Verification   Oxycodone                      101          UNEXPECTED ng/mg creat   Oxymorphone                    194          UNEXPECTED ng/mg creat   Noroxycodone                   466          UNEXPECTED ng/mg creat   Noroxymorphone                 83           UNEXPECTED ng/mg creat    Sources of oxycodone are scheduled prescription medications.    Oxymorphone, noroxycodone, and noroxymorphone are expected    metabolites of oxycodone. Oxymorphone is also available as a  scheduled prescription medication.   Acetaminophen                  PRESENT      UNEXPECTED Drug Absent but Declared for Prescription Verification   Gabapentin                     Not Detected UNEXPECTED   Tizanidine                     Not Detected UNEXPECTED    Tizanidine, as indicated in the declared medication list, is not    always detected even when used as directed.   Ibuprofen                      Not Detected UNEXPECTED    Ibuprofen, as indicated in the declared medication list, is not    always detected even when used as directed. ==================================================================== Test                      Result    Flag   Units      Ref Range   Creatinine              143              mg/dL      >=20 ==================================================================== Declared Medications:  The flagging and interpretation on this report are based on the  following declared  medications.  Unexpected results may arise from  inaccuracies in the declared medications.  **Note: The testing scope of this panel includes these medications:  Dimenhydrinate  Gabapentin  Sertraline (Zoloft)  **Note: The testing scope of this panel does not include small to  moderate amounts of these reported medications:  Ibuprofen  Tizanidine  **Note: The testing scope of this panel does not include the  following reported medications:  Atorvastatin  Buspirone  Cyanocobalamin  Docusate  Empagliflozin (Jardiance)  Famotidine (Pepcid)  Linaclotide  Lisinopril  Metoclopramide  Ondansetron  Pantoprazole  Pioglitazone  Sennosides (Senna) ==================================================================== For clinical consultation, please call [phone]. ====================================================================    UDS interpretation: No unexpected findings.         Patient was prescribed oxycodone by his primary care provider, prescription filled on 07/14/2019.  Appropriately positive for oxycodone Medication Assessment Form: Patient introduced to form today Treatment compliance: Treatment may start today if patient agrees with proposed plan. Evaluation of compliance is not applicable at this point Risk Assessment Profile: Aberrant behavior: See initial evaluations. None observed or detected today Comorbid factors increasing risk of overdose: See initial evaluation. No additional risks detected today Opioid risk tool (ORT):  Opioid Risk  09/07/2019  Alcohol 0  Illegal Drugs 0  Rx Drugs 0  Alcohol 0  Illegal Drugs 0  Rx Drugs 0  Age between 16-45 years  1  History of Preadolescent Sexual Abuse 0  Psychological Disease 0  ADD Negative  OCD Negative  Bipolar Negative  Depression 0  Opioid Risk Tool Scoring 1  Opioid Risk Interpretation Low Risk    ORT Scoring interpretation table:  Score <3 = Low Risk for SUD  Score between 4-7 = Moderate Risk for  SUD  Score >8 = High Risk for Opioid Abuse   Risk of substance use disorder (SUD): Low  Risk Mitigation Strategies:  Patient opioid safety counseling: Completed today. Counseling provided to patient as per "Patient Counseling Document". Document signed by patient, attesting to counseling and understanding  Patient-Prescriber Agreement (PPA): Obtained today.  Controlled substance notification to other providers: Written and sent today.  Pharmacologic Plan: Today we may be taking over the patient's pharmacological regimen. See below.             Laboratory Chemistry Profile   Screening Lab Results  Component Value Date   SARSCOV2NAA NOT DETECTED 05/12/2019   COVIDSOURCE NASOPHARYNGEAL 05/12/2019   STAPHAUREUS POSITIVE (A) 03/05/2019   MRSAPCR POSITIVE (A) 03/05/2019   HIV Non Reactive 11/25/2018    Inflammation (CRP: Acute Phase) (ESR: Chronic Phase) Lab Results  Component Value Date   CRP 2.3 (H) 07/01/2019   ESRSEDRATE 45 (H) 07/01/2019                         Rheumatology No results found for: RF, ANA, LABURIC, URICUR, LYMEIGGIGMAB, LYMEABIGMQN, HLAB27                      Renal Lab Results  Component Value Date   BUN 28 (H) 07/01/2019   CREATININE 1.62 (H) 07/01/2019   LABCREA 31 11/28/2018   GFR 118.24 10/24/2011   GFRAA 59 (L) 07/01/2019   GFRNONAA 51 (L) 07/01/2019                             Hepatic Lab Results  Component Value Date   AST 34 07/01/2019   ALT 24 07/01/2019   ALBUMIN 4.1 07/01/2019   ALKPHOS 125 07/01/2019   LIPASE 37 04/20/2019                        Electrolytes Lab Results  Component Value Date   NA 133 (L) 07/01/2019   K 3.9 07/01/2019   CL 102 07/01/2019   CALCIUM 9.3 07/01/2019   MG 2.0 11/29/2018   PHOS 3.5 11/29/2018                        Neuropathy Lab Results  Component Value Date   HGBA1C 11.2 (H) 09/09/2017   HIV Non Reactive 11/25/2018                        CNS No results found for: COLORCSF, APPEARCSF,  RBCCOUNTCSF, WBCCSF, POLYSCSF, LYMPHSCSF, EOSCSF, PROTEINCSF, GLUCCSF, JCVIRUS, CSFOLI, IGGCSF, LABACHR, ACETBL                      Bone No results found for: VD25OH, VD125OH2TOT, [id number], [id number], 25OHVITD1, 25OHVITD2, 25OHVITD3, TESTOFREE, TESTOSTERONE                       Coagulation Lab Results  Component Value Date   INR 1.07 11/26/2018   LABPROT 13.8 11/26/2018   PLT 228 07/01/2019   DDIMER  06/23/2007    0.35        AT THE INHOUSE ESTABLISHED CUTOFF VALUE OF 0.48 ug/mL FEU, THIS ASSAY HAS BEEN DOCUMENTED IN THE LITERATURE TO HAVE                        Cardiovascular Lab Results  Component Value Date   TROPONINI <0.03 04/20/2019   HGB 11.4 (L) 07/01/2019   HCT 35.1 (L) 07/01/2019  ID Lab Results  Component Value Date   HIV Non Reactive 11/25/2018   SARSCOV2NAA NOT DETECTED 05/12/2019   STAPHAUREUS POSITIVE (A) 03/05/2019   MRSAPCR POSITIVE (A) 03/05/2019    Cancer No results found for: CEA, CA125, LABCA2                      Endocrine No results found for: TSH, FREET4, TESTOFREE, TESTOSTERONE, SHBG, ESTRADIOL, ESTRADIOLPCT, ESTRADIOLFRE, LABPREG, ACTH                      Note: Lab results reviewed.  Recent Diagnostic Imaging Review  Cervical Imaging:  Results for orders placed during the hospital encounter of 09/09/17  DG Cervical Spine 2-3 Views   Narrative CLINICAL DATA:  Anterior cervical spinal fusion at C6-C7.  EXAM: CERVICAL SPINE - 2-3 VIEW  COMPARISON:  MRI of the cervical spine performed 09/03/2017  FINDINGS: Anterior cervical spinal fusion hardware is partially characterized at C6-C7. Prevertebral soft tissues are not well assessed due to the endotracheal tube. Visualized vertebral bodies are grossly unremarkable in appearance.  IMPRESSION: Status post anterior cervical spinal fusion at C6-C7.   Electronically Signed   By: Garald Balding M.D.   On: 09/09/2017 22:21     Cervical DG complete:   Results for orders placed during the hospital encounter of 01/19/16  DG Cervical Spine Complete   Narrative CLINICAL DATA:  Left shoulder pain with left arm and finger numbness for the past 3-4 weeks, no known injury. Patient has no back complaints.  EXAM: CERVICAL SPINE - COMPLETE 4+ VIEW  COMPARISON:  None in PACs  FINDINGS: The cervical vertebral bodies are preserved in height. The disc space heights are well maintained. There is no perched facet or spinous process fracture. There is no bony encroachment upon the neural foramina. The odontoid is intact. The prevertebral soft tissue spaces are normal.  IMPRESSION: There is no acute bony abnormality of the cervical spine. No significant disc space narrowing is observed.   Electronically Signed   By: David  Martinique M.D.   On: 01/19/2016 16:30     Results for orders placed during the hospital encounter of 01/24/08  DG Lumbar Spine Complete   Narrative Clinical Data: 43 year-old-male with low back pain.   LUMBAR SPINE - 5 VIEW - 01/24/08:   Comparison: None.   Findings: Normal alignment without compression fracture, wedge-shaped deformity or focal kyphosis. No pars defects. Intact pedicles.   IMPRESSION:  No acute finding.    Provider: Oletha Blend          Complexity Note: Imaging results reviewed. Results shared with Richard Mendoza, using Layman's terms.                         Meds   Current Outpatient Medications:  .  atorvastatin (LIPITOR) 20 MG tablet, Take 20 mg by mouth daily., Disp: , Rfl: 3 .  busPIRone (BUSPAR) 15 MG tablet, Take 15 mg by mouth 2 (two) times daily., Disp: , Rfl:  .  [employer] SENNA 8.6 MG tablet, Take 1 tablet by mouth 2 (two) times daily as needed for constipation. , Disp: , Rfl: 5 .  dimenhyDRINATE (DRAMAMINE) 50 MG tablet, Take 50 mg by mouth 2 (two) times daily as needed for itching or dizziness. , Disp: , Rfl:  .  docusate sodium (COLACE) 100 MG capsule, Take 1 capsule (100 mg total) by mouth  2 (two) times daily as  needed for mild constipation., Disp: 10 capsule, Rfl: 0 .  famotidine (PEPCID) 20 MG tablet, Take 20 mg by mouth 2 (two) times daily., Disp: , Rfl:  .  gabapentin (NEURONTIN) 300 MG capsule, Take 600 mg by mouth at bedtime. , Disp: , Rfl:  .  insulin aspart (NOVOLOG) 100 UNIT/ML injection, Inject into the skin 3 (three) times daily before meals. Via pump, Disp: , Rfl:  .  Insulin Human (INSULIN PUMP) SOLN, Inject 2.5 each into the skin every hour., Disp: , Rfl:  .  linaclotide (LINZESS) 72 MCG capsule, Take 72 mcg by mouth daily as needed (constipation)., Disp: , Rfl:  .  lisinopril (PRINIVIL,ZESTRIL) 5 MG tablet, Take 5 mg by mouth daily., Disp: , Rfl: 5 .  metoCLOPramide (REGLAN) 10 MG tablet, Take 10 mg by mouth daily. , Disp: , Rfl:  .  ondansetron (ZOFRAN) 8 MG tablet, Take 8 mg by mouth every 6 (six) hours as needed for nausea/vomiting., Disp: , Rfl:  .  pantoprazole (PROTONIX) 40 MG tablet, Take 40 mg by mouth daily., Disp: , Rfl: 5 .  pioglitazone (ACTOS) 30 MG tablet, Take 30 mg by mouth daily., Disp: , Rfl:  .  sertraline (ZOLOFT) 100 MG tablet, Take 100 mg by mouth daily. , Disp: , Rfl: 5 .  Tedizolid Phosphate 200 MG TABS, Take 1 tablet by mouth daily., Disp: 20 tablet, Rfl: 1 .  tiZANidine (ZANAFLEX) 4 MG tablet, Take 1 tablet (4 mg total) by mouth every 6 (six) hours as needed for muscle spasms., Disp: 60 tablet, Rfl: 0 .  vitamin B-12 (CYANOCOBALAMIN) 1000 MCG tablet, Take 1,000 mcg by mouth daily., Disp: , Rfl:  .  Buprenorphine HCl (BELBUCA) 300 MCG FILM, Place 1 Film inside cheek every 12 (twelve) hours., Disp: 60 each, Rfl: 0  Current Facility-Administered Medications:  .  DAPTOmycin (CUBICIN) 780 mg in sodium chloride 0.9 % IVPB, 780 mg, Intravenous, Once, Leonel Ramsay, MD  ROS  Constitutional: Denies any fever or chills Gastrointestinal: No reported hemesis, hematochezia, vomiting, or acute GI distress Musculoskeletal: Denies any acute onset  joint swelling, redness, loss of ROM, or weakness Neurological: No reported episodes of acute onset apraxia, aphasia, dysarthria, agnosia, amnesia, paralysis, loss of coordination, or loss of consciousness  Allergies  Richard Mendoza has No Known Allergies.  PFSH  Drug: Richard Mendoza  reports no history of drug use. Alcohol:  reports current alcohol use. Tobacco:  reports that he quit smoking about 7 months ago. His smoking use included cigarettes. He has a 17.00 pack-year smoking history. He has never used smokeless tobacco. Medical:  has a past medical history of DIABETES MELLITUS, TYPE II, UNCONTROLLED (03/17/2009), DM (12/08/2008), HYPERLIPIDEMIA (03/17/2009), HYPERTENSION (12/08/2008), and YEAST BALANITIS (03/17/2009). Surgical: Richard Mendoza  has a past surgical history that includes Appendectomy; Rotator cuff repair (Left); Anterior cervical decomp/discectomy fusion (N/A, 09/09/2017); I&D extremity (Right, 10/03/2017); osteomylitis; I&D extremity (Right, 11/26/2018); Incision and drainage (Right, 03/06/2019); and Metatarsal head excision (Right, 05/15/2019). Family: family history includes Arthritis in an other family member; Cancer in an other family member; Diabetes in his father and mother; Heart disease in his father; Hyperlipidemia in an other family member; Hypertension in an other family member.  Constitutional Exam  General appearance: Well nourished, well developed, and well hydrated. In no apparent acute distress Vitals:   09/07/19 1420  BP: 131/90  Pulse: 92  Resp: 16  Temp: 98.6 F (37 C)  TempSrc: Temporal  SpO2: 100%  Weight: 220 lb (99.8 kg)  Height: _0  (1.778 m)   BMI Assessment: Estimated body mass index is 31.57 kg/m as calculated from the following:   Height as of this encounter: _1  (1.778 m).   Weight as of this encounter: 220 lb (99.8 kg).  BMI interpretation table: BMI level Category Range association with higher incidence of chronic pain  <18 kg/m2 Underweight    18.5-24.9 kg/m2 Ideal body weight   25-29.9 kg/m2 Overweight Increased incidence by 20%  30-34.9 kg/m2 Obese (Class I) Increased incidence by 68%  35-39.9 kg/m2 Severe obesity (Class II) Increased incidence by 136%  >40 kg/m2 Extreme obesity (Class III) Increased incidence by 254%   Patient's current BMI Ideal Body weight  Body mass index is 31.57 kg/m. Ideal body weight: 73 kg (160 lb 15 oz) Adjusted ideal body weight: 83.7 kg (184 lb 9 oz)   BMI Readings from Last 4 Encounters:  09/07/19 31.57 kg/m  07/16/19 30.99 kg/m  06/16/19 30.99 kg/m  05/21/19 32.00 kg/m   Wt Readings from Last 4 Encounters:  09/07/19 220 lb (99.8 kg)  07/16/19 216 lb (98 kg)  06/16/19 216 lb (98 kg)  05/21/19 223 lb (101.2 kg)  Psych/Mental status: Alert, oriented x 3 (person, place, & time)       Eyes: PERLA Respiratory: No evidence of acute respiratory distress  Cervical Spine Area Exam  Skin & Axial Inspection: Well healed scar from previous spine surgery detected Alignment: Symmetrical Functional ROM: Decreased ROM, bilaterally Stability: No instability detected Muscle Tone/Strength: Functionally intact. No obvious neuro-muscular anomalies detected. Sensory (Neurological): Arthropathic arthralgia Palpation: Complains of area being tender to palpation                    Upper Extremity (UE) Exam    Side: Right upper extremity  Side: Left upper extremity   Skin & Extremity Inspection: Skin color, temperature, and hair growth are WNL. No peripheral edema or cyanosis. No masses, redness, swelling, asymmetry, or associated skin lesions. No contractures.  Skin & Extremity Inspection: Evidence of prior arthroplastic surgery   Functional ROM: Decreased ROM          Functional ROM: Mechanically restricted ROM for shoulder   Muscle Tone/Strength: Functionally intact. No obvious neuro-muscular anomalies detected.  Muscle Tone/Strength: Functionally intact. No obvious neuro-muscular anomalies  detected.   Sensory (Neurological): Arthropathic arthralgia          Sensory (Neurological): Arthropathic arthralgia           Palpation: No palpable anomalies              Palpation: Complains of area being tender to palpation               Provocative Test(s):  Phalen's test: deferred Tinel's test: deferred Apley's scratch test (touch opposite shoulder):  Action 1 (Across chest): Decreased ROM Action 2 (Overhead): Decreased ROM Action 3 (LB reach): Decreased ROM   Provocative Test(s):  Phalen's test: deferred Tinel's test: deferred Apley's scratch test (touch opposite shoulder):  Action 1 (Across chest): Decreased ROM Action 2 (Overhead): Decreased ROM Action 3 (LB reach): Decreased ROM     Thoracic Spine Area Exam  Skin & Axial Inspection: No masses, redness, or swelling Alignment: Symmetrical Functional ROM: Unrestricted ROM Stability: No instability detected Muscle Tone/Strength: Functionally intact. No obvious neuro-muscular anomalies detected. Sensory (Neurological): Unimpaired Muscle strength & Tone: No palpable anomalies  Lumbar Spine Area Exam  Skin & Axial Inspection: No masses, redness, or swelling Alignment: Symmetrical Functional ROM: Decreased ROM  Stability: No instability detected Muscle Tone/Strength: Functionally intact. No obvious neuro-muscular anomalies detected. Sensory (Neurological): Musculoskeletal pain pattern and dermatomal Palpation: Complains of area being tender to palpation       Provocative Tests: Hyperextension/rotation test: (+) bilaterally for facet joint pain. Lumbar quadrant test (Kemp's test): deferred today       Lateral bending test: deferred today       Patrick's Maneuver: (+) for bilateral S-I arthralgia             FABER* test: (+) for bilateral S-I arthralgia             S-I anterior distraction/compression test: deferred today         S-I lateral compression test: deferred today         S-I Thigh-thrust test:  deferred today         S-I Gaenslen's test: deferred today         *(Flexion, ABduction and External Rotation)  Gait & Posture Assessment  Ambulation: Unassisted Gait: Relatively normal for age and body habitus Posture: WNL   Lower Extremity Exam    Side: Right lower extremity  Side: Left lower extremity  Stability: No instability observed          Stability: No instability observed          Skin & Extremity Inspection: Some redness observed gauze in place over right toe due to nonhealing wound  Skin & Extremity Inspection: Skin color, temperature, and hair growth are WNL. No peripheral edema or cyanosis. No masses, redness, swelling, asymmetry, or associated skin lesions. No contractures.  Functional ROM: Decreased ROM for hip and knee joints          Functional ROM: Decreased ROM for hip and knee joints          Muscle Tone/Strength: Functionally intact. No obvious neuro-muscular anomalies detected.  Muscle Tone/Strength: Functionally intact. No obvious neuro-muscular anomalies detected.  Sensory (Neurological): Neuropathic pain pattern and arthropathic        Sensory (Neurological): Neuropathic pain pattern and arthropathic        DTR: Patellar: 1+: trace Achilles: 0: absent Plantar: deferred today  DTR: Patellar: 1+: trace Achilles: 0: absent Plantar: deferred today  Palpation: No palpable anomalies  Palpation: No palpable anomalies     Assessment & Plan  Primary Diagnosis & Pertinent Problem List: The primary encounter diagnosis was Primary osteoarthritis of both shoulders. Diagnoses of 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), Chronic pain of both shoulders, Cervical radicular pain, S/P cervical spinal fusion, Diabetic neuropathy, type I diabetes mellitus ([place]), Pain management contract signed, Chronic pain syndrome, Cervical spondylosis, and Cervical facet joint syndrome were also pertinent to this visit.  Visit Diagnosis: 1. Primary osteoarthritis of both  shoulders   2. 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 Chronic pain of both shoulders   4. Cervical radicular pain   5. S/P cervical spinal fusion   6. Diabetic neuropathy, type I diabetes mellitus ([place])   7. Pain management contract signed   8. Chronic pain syndrome   9. Cervical spondylosis   10. Cervical facet joint syndrome    Problems updated and reviewed during this visit: Problem  Pain Management Contract Signed   General Recommendations: The pain condition that the patient suffers from is best treated with a multidisciplinary approach that involves an increase in physical activity to prevent de-conditioning and worsening of the pain cycle, as well as psychological counseling (formal and/or informal) to address the  co-morbid psychological affects of pain. Treatment will often involve judicious use of pain medications and interventional procedures to decrease the pain, allowing the patient to participate in the physical activity that will ultimately produce long-lasting pain reductions. The goal of the multidisciplinary approach is to return the patient to a higher level of overall function and to restore their ability to perform activities of daily living.  1.  Discussed diagnostic right suprascapular nerve block for his persistent right shoulder pain and rotator cuff pathology.  If effective can consider pulsed radiofrequency ablation. 2.  Had extensive discussion with patient regarding medication management.  Of note patient has tried and failed oxycodone, hydrocodone, extended release oxycodone, morphine.  He has also tried and failed tramadol in the past.  I generally do not recommend controlled 2 opioid analgesics for the patient's pain condition.  He does have significant diabetic neuropathy, cervical facet joint syndrome, history of cervical spine fusion at C6-C7 along with left rotator cuff surgery.  Buprenorphine is a moderate acting opioid that has less side effect  potential including less cognitive impairment, immunosuppression, dependence, addiction risk.  Given that he has tried and failed various short acting and long-acting medications, recommend belbuca.  Risks and benefits discussed and patient would like to proceed. Sign contract. 3.  Continue gabapentin 600 mg nightly, tizanidine 4 mg every 6 hours as needed.  Avoid NSAIDs. 4.  Continue psychiatric management and psychotropic medications as prescribed.   Plan of Care  Pharmacotherapy (Medications Ordered): Meds ordered this encounter  Medications  . Buprenorphine HCl (BELBUCA) 300 MCG FILM    Sig: Place 1 Film inside cheek every 12 (twelve) hours.    Dispense:  60 each    Refill:  0    Procedure Orders     SUPRASCAPULAR NERVE BLOCK   Pharmacological management options:  Opioid Analgesics: We'll take over management today. See above orders Membrane stabilizer: Currently on a membrane stabilizer Muscle relaxant: Currently on a muscle relaxant NSAID: Medically contraindicated Other analgesic(s): To be determined at a later time    Interventional management options: Planned, scheduled, and/or pending:    R suprascapular NB +/- RFA    Provider-requested follow-up: Return in about 2 weeks (around 09/21/2019) for Procedure: Right suprascpular NB , without sedation. Recent Visits Date Type Provider Dept  07/16/19 Office Visit Gillis Santa, MD [facility]  Showing recent visits within past 90 days and meeting all other requirements   Today's Visits Date Type Provider Dept  09/07/19 Office Visit Gillis Santa, MD [facility]  Showing today's visits and meeting all other requirements   Future Appointments Date Type Provider Dept  09/21/19 Appointment Gillis Santa, MD [facility]  Showing future appointments within next 90 days and meeting all other requirements   Primary Care Physician: Tracie Harrier, MD Location: [facility]  Facility Note by: Gillis Santa, MD Date: 09/07/2019; Time: 3:40 PM  Note: This dictation was prepared with Dragon dictation. Any transcriptional errors that may result from this process are unintentional.

## 2019-09-08 ENCOUNTER — Encounter: Payer: Self-pay | Admitting: Student in an Organized Health Care Education/Training Program

## 2019-09-09 ENCOUNTER — Encounter: Payer: Self-pay | Admitting: Student in an Organized Health Care Education/Training Program

## 2019-09-21 ENCOUNTER — Ambulatory Visit (HOSPITAL_BASED_OUTPATIENT_CLINIC_OR_DEPARTMENT_OTHER): Payer: Medicaid Other | Admitting: Student in an Organized Health Care Education/Training Program

## 2019-09-21 ENCOUNTER — Ambulatory Visit
Admission: RE | Admit: 2019-09-21 | Discharge: 2019-09-21 | Disposition: A | Discharge status: Home / Self Care | Payer: Medicaid Other | Source: Ambulatory Visit | Attending: Student in an Organized Health Care Education/Training Program | Admitting: Student in an Organized Health Care Education/Training Program

## 2019-09-21 ENCOUNTER — Encounter: Payer: Self-pay | Admitting: Student in an Organized Health Care Education/Training Program

## 2019-09-21 ENCOUNTER — Other Ambulatory Visit: Payer: Self-pay

## 2019-09-21 VITALS — BP 134/87 | HR 76 | Temp 98.4°F | Resp 15 | Ht 70.0 in | Wt 215.0 lb

## 2019-09-21 DIAGNOSIS — M19011 Primary osteoarthritis, right shoulder: Secondary | ICD-10-CM

## 2019-09-21 DIAGNOSIS — M19012 Primary osteoarthritis, left shoulder: Secondary | ICD-10-CM | POA: Insufficient documentation

## 2019-09-21 MED ORDER — LIDOCAINE HCL 2 % IJ SOLN
20.0000 mL | Freq: Once | INTRAMUSCULAR | Status: AC
  Administered 2019-09-21: 10:00:00 400 mg

## 2019-09-21 MED ORDER — DEXAMETHASONE SODIUM PHOSPHATE 10 MG/ML IJ SOLN
INTRAMUSCULAR | Status: AC
  Filled 2019-09-21: qty 1

## 2019-09-21 MED ORDER — LIDOCAINE HCL 2 % IJ SOLN
INTRAMUSCULAR | Status: AC
  Filled 2019-09-21: qty 20

## 2019-09-21 MED ORDER — DEXAMETHASONE SODIUM PHOSPHATE 10 MG/ML IJ SOLN
10.0000 mg | Freq: Once | INTRAMUSCULAR | Status: AC
  Administered 2019-09-21: 10 mg

## 2019-09-21 MED ORDER — ROPIVACAINE HCL 2 MG/ML IJ SOLN
1.0000 mL | Freq: Once | INTRAMUSCULAR | Status: AC
  Administered 2019-09-21: 10:00:00 10 mL via EPIDURAL

## 2019-09-21 MED ORDER — ROPIVACAINE HCL 2 MG/ML IJ SOLN
INTRAMUSCULAR | Status: AC
  Filled 2019-09-21: qty 10

## 2019-09-21 MED ORDER — IOHEXOL 180 MG/ML  SOLN
10.0000 mL | Freq: Once | INTRAMUSCULAR | Status: AC
  Administered 2019-09-21: 10 mL via INTRATHECAL
  Filled 2019-09-21: qty 20

## 2019-09-21 NOTE — Progress Notes (Signed)
Patient's Name: Richard Mendoza  MRN: [id number]  Referring Provider: Barbette Reichmann, MD  DOB: 1976/09/02  PCP: Barbette Reichmann, MD  DOS: 09/21/2019  Note by: Edward Jolly, MD  Service setting: Ambulatory outpatient  Specialty: Interventional Pain Management  Patient type: Established  Location: [facility] (AMB) Pain Management Facility  Visit type: Interventional Procedure   Primary Reason for Visit: Interventional Pain Management Treatment. CC: Shoulder Pain (bilateral, right is worse)  Procedure:          Anesthesia, Analgesia, Anxiolysis:  Type: Diagnostic Suprascapular nerve Block #1  Primary Purpose: Diagnostic Region: Posterior Shoulder & Scapular Areas Level: Superior to the scapular spine, in the lateral aspect of the supraspinatus fossa (Suprescapular notch). Target Area: Suprascapular nerve as it passes thru the lower portion of the suprascapular notch. Approach: Posterior percutaneous approach. Laterality: Right-Side  Type: Local Anesthesia  Local Anesthetic: Lidocaine 1-2%  Position: Prone   Indications: 1. Primary osteoarthritis of both shoulders   R>L  Pain Score: Pre-procedure: 8 /10 Post-procedure: 5 /10   Pre-op Assessment:  Richard Mendoza is a 43 y.o. (year old), male patient, seen today for interventional treatment. He  has a past surgical history that includes Appendectomy; Rotator cuff repair (Left); Anterior cervical decomp/discectomy fusion (N/A, 09/09/2017); I&D extremity (Right, 10/03/2017); osteomylitis; I&D extremity (Right, 11/26/2018); Incision and drainage (Right, 03/06/2019); and Metatarsal head excision (Right, 05/15/2019). Richard Mendoza has a current medication list which includes the following prescription(s): atorvastatin, belbuca, buspirone, [facility] senna, dimenhydrinate, docusate sodium, famotidine, gabapentin, insulin aspart, insulin pump, linaclotide, lisinopril, metoclopramide, ondansetron, pantoprazole, pioglitazone, sertraline, tedizolid phosphate, tizanidine,  and vitamin b-12, and the following Facility-Administered Medications: DAPTOmycin (CUBICIN) 780 mg in sodium chloride 0.9 % IVPB. His primarily concern today is the Shoulder Pain (bilateral, right is worse)  Initial Vital Signs:  Pulse/HCG Rate: 76ECG Heart Rate: 72 Temp: 98.4 F (36.9 C) Resp: 18 BP: 136/86 SpO2: 100 %  BMI: Estimated body mass index is 30.85 kg/m as calculated from the following:   Height as of this encounter: 5\' 10"  (1.778 m).   Weight as of this encounter: 215 lb (97.5 kg).  Risk Assessment: Allergies: Reviewed. He has No Known Allergies.  Allergy Precautions: None required Coagulopathies: Reviewed. None identified.  Blood-thinner therapy: None at this time Active Infection(s): Reviewed. None identified. Richard Mendoza is afebrile  Site Confirmation: Richard Mendoza was asked to confirm the procedure and laterality before marking the site Procedure checklist: Completed Consent: Before the procedure and under the influence of no sedative(s), amnesic(s), or anxiolytics, the patient was informed of the treatment options, risks and possible complications. To fulfill our ethical and legal obligations, as recommended by the American Medical Association's Code of Ethics, I have informed the patient of my clinical impression; the nature and purpose of the treatment or procedure; the risks, benefits, and possible complications of the intervention; the alternatives, including doing nothing; the risk(s) and benefit(s) of the alternative treatment(s) or procedure(s); and the risk(s) and benefit(s) of doing nothing. The patient was provided information about the general risks and possible complications associated with the procedure. These may include, but are not limited to: failure to achieve desired goals, infection, bleeding, organ or nerve damage, allergic reactions, paralysis, and death. In addition, the patient was informed of those risks and complications associated to the procedure,  such as failure to decrease pain; infection; bleeding; organ or nerve damage with subsequent damage to sensory, motor, and/or autonomic systems, resulting in permanent pain, numbness, and/or weakness of one or several areas of the body;  allergic reactions; (i.e.: anaphylactic reaction); and/or death. Furthermore, the patient was informed of those risks and complications associated with the medications. These include, but are not limited to: allergic reactions (i.e.: anaphylactic or anaphylactoid reaction(s)); adrenal axis suppression; blood sugar elevation that in diabetics may result in ketoacidosis or comma; water retention that in patients with history of congestive heart failure may result in shortness of breath, pulmonary edema, and decompensation with resultant heart failure; weight gain; swelling or edema; medication-induced neural toxicity; particulate matter embolism and blood vessel occlusion with resultant organ, and/or nervous system infarction; and/or aseptic necrosis of one or more joints. Finally, the patient was informed that Medicine is not an exact science; therefore, there is also the possibility of unforeseen or unpredictable risks and/or possible complications that may result in a catastrophic outcome. The patient indicated having understood very clearly. We have given the patient no guarantees and we have made no promises. Enough time was given to the patient to ask questions, all of which were answered to the patient's satisfaction. Richard Mendoza has indicated that he wanted to continue with the procedure. Attestation: I, the ordering provider, attest that I have discussed with the patient the benefits, risks, side-effects, alternatives, likelihood of achieving goals, and potential problems during recovery for the procedure that I have provided informed consent. Date  Time: 09/21/2019  9:50 AM  Pre-Procedure Preparation:  Monitoring: As per clinic protocol. Respiration, ETCO2, SpO2, BP,  heart rate and rhythm monitor placed and checked for adequate function Safety Precautions: Patient was assessed for positional comfort and pressure points before starting the procedure. Time-out: I initiated and conducted the "Time-out" before starting the procedure, as per protocol. The patient was asked to participate by confirming the accuracy of the "Time Out" information. Verification of the correct person, site, and procedure were performed and confirmed by me, the nursing staff, and the patient. "Time-out" conducted as per Joint Commission's Universal Protocol (UP.01.01.01). Time: 1022  Description of Procedure:          Area Prepped: Entire shoulder Area Prepping solution: DuraPrep (Iodine Povacrylex [0.7% available iodine] and Isopropyl Alcohol, 74% w/w) Safety Precautions: Aspiration looking for blood return was conducted prior to all injections. At no point did we inject any substances, as a needle was being advanced. No attempts were made at seeking any paresthesias. Safe injection practices and needle disposal techniques used. Medications properly checked for expiration dates. SDV (single dose vial) medications used. Description of the Procedure: Protocol guidelines were followed. The patient was placed in position over the procedure table. The target area was identified and the area prepped in the usual manner. Skin & deeper tissues infiltrated with local anesthetic. Appropriate amount of time allowed to pass for local anesthetics to take effect. The procedure needles were then advanced to the target area. Proper needle placement secured. Negative aspiration confirmed. Solution injected in intermittent fashion, asking for systemic symptoms every 0.5cc of injectate. The needles were then removed and the area cleansed, making sure to leave some of the prepping solution back to take advantage of its long term bactericidal properties.  Vitals:   09/21/19 0953 09/21/19 1023 09/21/19 1028  BP:  136/86 124/84 134/87  Pulse: 76    Resp: 18 16 15   Temp: 98.4 F (36.9 C)    TempSrc: Oral    SpO2: 100% 100% 100%  Weight: 215 lb (97.5 kg)    Height: 5\' 10"  (1.778 m)      Start Time: 1022 hrs. End Time: 1027 hrs. Materials:  Needle(s) Type: Spinal Needle Gauge: 25G Length: 3.5-in Medication(s): Please see orders for medications and dosing details. 3 cc solution made of 2 cc of 0.2% ropivacaine, 1 cc of Decadron. Imaging Guidance (Non-Spinal):          Type of Imaging Technique: Fluoroscopy Guidance (Non-Spinal) Indication(s): Assistance in needle guidance and placement for procedures requiring needle placement in or near specific anatomical locations not easily accessible without such assistance. Exposure Time: Please see nurses notes. Contrast: Before injecting any contrast, we confirmed that the patient did not have an allergy to iodine, shellfish, or radiological contrast. Once satisfactory needle placement was completed at the desired level, radiological contrast was injected. Contrast injected under live fluoroscopy. No contrast complications. See chart for type and volume of contrast used. Fluoroscopic Guidance: I was personally present during the use of fluoroscopy. "Tunnel Vision Technique" used to obtain the best possible view of the target area. Parallax error corrected before commencing the procedure. "Direction-depth-direction" technique used to introduce the needle under continuous pulsed fluoroscopy. Once target was reached, antero-posterior, oblique, and lateral fluoroscopic projection used confirm needle placement in all planes. Images permanently stored in EMR. Interpretation: I personally interpreted the imaging intraoperatively. Adequate needle placement confirmed in multiple planes. Appropriate spread of contrast into desired area was observed. No evidence of afferent or efferent intravascular uptake. Permanent images saved into the patient's record.  Antibiotic  Prophylaxis:   Anti-infectives (From admission, onward)   None     Indication(s): None identified  Post-operative Assessment:  Post-procedure Vital Signs:  Pulse/HCG Rate: 7672 Temp: 98.4 F (36.9 C) Resp: 15 BP: 134/87 SpO2: 100 %  EBL: None  Complications: No immediate post-treatment complications observed by team, or reported by patient.  Note: The patient tolerated the entire procedure well. A repeat set of vitals were taken after the procedure and the patient was kept under observation following institutional policy, for this type of procedure. Post-procedural neurological assessment was performed, showing return to baseline, prior to discharge. The patient was provided with post-procedure discharge instructions, including a section on how to identify potential problems. Should any problems arise concerning this procedure, the patient was given instructions to immediately contact us, at any time, without hesitation. In any case, we plan to contact the patient by telephone for a follow-up status report regarding this interventional procedure.  Comments:  No additional relevant information.  Plan of Care  Orders:  Orders Placed This Encounter  Procedures  . DG PAIN [facility] C-ARM 1-60 MIN NO REPORT    Intraoperative interpretation by procedural physician at [place].    Standing Status:   Standing    Number of Occurrences:   1    Order Specific Question:   Reason for exam:    Answer:   Assistance in needle guidance and placement for procedures requiring needle placement in or near specific anatomical locations not easily accessible without such assistance.   Chronic Opioid Analgesic:   Belbuca 300 mcg BID   Medications ordered for procedure: Meds ordered this encounter  Medications  . iohexol (OMNIPAQUE) 180 MG/ML injection 10 mL    Must be Myelogram-compatible. If not available, you may substitute with a water-soluble, non-ionic, hypoallergenic,  myelogram-compatible radiological contrast medium.  Marland Kitchen lidocaine (XYLOCAINE) 2 % (with pres) injection 400 mg  . ropivacaine (PF) 2 mg/mL (0.2%) (NAROPIN) injection 1 mL  . dexamethasone (DECADRON) injection 10 mg   Medications administered: We administered iohexol, lidocaine, ropivacaine (PF) 2 mg/mL (0.2%), and dexamethasone.  See the medical record  for exact dosing, route, and time of administration.  Follow-up plan:   Return in about 4 weeks (around 10/19/2019) for Post Procedure Evaluation, virtual.       Interventional management options: Planned, scheduled, and/or pending:    R suprascapular NB (09/21/2019) +/- RFA      Recent Visits Date Type Provider Dept  09/07/19 Office Visit Edward Jolly, MD [facility]  07/16/19 Office Visit Edward Jolly, MD [facility]  Showing recent visits within past 90 days and meeting all other requirements   Today's Visits Date Type Provider Dept  09/21/19 Procedure visit Edward Jolly, MD [facility]  Showing today's visits and meeting all other requirements   Future Appointments Date Type Provider Dept  10/12/19 Appointment Edward Jolly, MD [facility]  Showing future appointments within next 90 days and meeting all other requirements   Disposition: Discharge home  Discharge Date & Time: 09/21/2019; 1035 hrs.   Primary Care Physician: Barbette Reichmann, MD Location: [facility] Facility Note by: Edward Jolly, MD Date: 09/21/2019; Time: 12:32 PM  Disclaimer:  Medicine is not an exact science. The only guarantee in medicine is that nothing is guaranteed. It is important to note that the decision to proceed with this intervention was based on the information collected from the patient. The Data and conclusions were drawn from the patient's questionnaire, the interview, and the physical examination. Because the information was provided in large part by the patient, it cannot be  guaranteed that it has not been purposely or unconsciously manipulated. Every effort has been made to obtain as much relevant data as possible for this evaluation. It is important to note that the conclusions that lead to this procedure are derived in large part from the available data. Always take into account that the treatment will also be dependent on availability of resources and existing treatment guidelines, considered by other Pain Management Practitioners as being common knowledge and practice, at the time of the intervention. For Medico-Legal purposes, it is also important to point out that variation in procedural techniques and pharmacological choices are the acceptable norm. The indications, contraindications, technique, and results of the above procedure should only be interpreted and judged by a Board-Certified Interventional Pain Specialist with extensive familiarity and expertise in the same exact procedure and technique.

## 2019-09-21 NOTE — Progress Notes (Signed)
Safety precautions to be maintained throughout the outpatient stay will include: orient to surroundings, keep bed in low position, maintain call bell within reach at all times, provide assistance with transfer out of bed and ambulation.  

## 2019-09-21 NOTE — Patient Instructions (Signed)

## 2019-09-22 ENCOUNTER — Telehealth: Payer: Self-pay | Admitting: *Deleted

## 2019-09-22 NOTE — Telephone Encounter (Signed)
Left voicemail with patient to call our office if there are any questions or concerns re; procedure on yesterday. 

## 2019-10-08 ENCOUNTER — Encounter: Payer: Self-pay | Admitting: Student in an Organized Health Care Education/Training Program

## 2019-10-12 ENCOUNTER — Ambulatory Visit
Payer: Medicaid Other | Attending: Student in an Organized Health Care Education/Training Program | Admitting: Student in an Organized Health Care Education/Training Program

## 2019-10-12 ENCOUNTER — Encounter: Payer: Self-pay | Admitting: Student in an Organized Health Care Education/Training Program

## 2019-10-12 ENCOUNTER — Other Ambulatory Visit: Payer: Self-pay

## 2019-10-12 DIAGNOSIS — M19011 Primary osteoarthritis, right shoulder: Secondary | ICD-10-CM | POA: Diagnosis not present

## 2019-10-12 DIAGNOSIS — G894 Chronic pain syndrome: Secondary | ICD-10-CM

## 2019-10-12 DIAGNOSIS — M25511 Pain in right shoulder: Secondary | ICD-10-CM | POA: Diagnosis not present

## 2019-10-12 DIAGNOSIS — M19012 Primary osteoarthritis, left shoulder: Secondary | ICD-10-CM

## 2019-10-12 DIAGNOSIS — M75102 Unspecified rotator cuff tear or rupture of left shoulder, not specified as traumatic: Secondary | ICD-10-CM

## 2019-10-12 DIAGNOSIS — M25512 Pain in left shoulder: Secondary | ICD-10-CM

## 2019-10-12 DIAGNOSIS — G8929 Other chronic pain: Secondary | ICD-10-CM

## 2019-10-12 DIAGNOSIS — M12812 Other specific arthropathies, not elsewhere classified, left shoulder: Secondary | ICD-10-CM

## 2019-10-12 DIAGNOSIS — Z981 Arthrodesis status: Secondary | ICD-10-CM

## 2019-10-12 DIAGNOSIS — E1040 Type 1 diabetes mellitus with diabetic neuropathy, unspecified: Secondary | ICD-10-CM

## 2019-10-12 DIAGNOSIS — M5412 Radiculopathy, cervical region: Secondary | ICD-10-CM | POA: Diagnosis not present

## 2019-10-12 MED ORDER — BELBUCA 450 MCG BU FILM: 1.0000 | ORAL_FILM | Freq: Two times a day (BID) | BUCCAL | 1 refills | Status: DC

## 2019-10-12 NOTE — Progress Notes (Signed)
Pain Management Virtual Encounter Note - Virtual Visit via [place] (real-time audio visits between healthcare provider and patient).   Patient's Phone No. & Preferred Pharmacy:  [phone] (home); [phone] (mobile); (Preferred) [phone] [email or web address]  [id number] [address], [place] [address] [place] [address] Phone: [phone] Fax: [phone]    Pre-screening note:  Our staff contacted Richard Mendoza and offered him an "in person", "face-to-face" appointment versus a telephone encounter. He indicated preferring the telephone encounter, at this time.   Reason for Virtual Visit: COVID-19*  Social distancing based on CDC and AMA recommendations.   I contacted Richard Mendoza on 10/12/2019 via video conference.      I clearly identified myself as Gillis Santa, MD. I verified that I was speaking with the correct person using two identifiers (Name: Richard Mendoza, and date of birth: 23-Sep-1976).  Advanced Informed Consent I sought verbal advanced consent from Richard Mendoza for virtual visit interactions. I informed Richard Mendoza of possible security and privacy concerns, risks, and limitations associated with providing "not-in-person" medical evaluation and management services. I also informed Richard Mendoza of the availability of "in-person" appointments. Finally, I informed him that there would be a charge for the virtual visit and that he could be  personally, fully or partially, financially responsible for it. Richard Mendoza expressed understanding and agreed to proceed.   Historic Elements   Richard Mendoza is a 43 y.o. year old, male patient evaluated today after his last encounter by our practice on 09/22/2019. 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 He also   has a past surgical history that includes Appendectomy; Rotator cuff repair (Left); Anterior cervical decomp/discectomy fusion (N/A, 09/09/2017); I&D extremity (Right, 10/03/2017); osteomylitis; I&D extremity (Right, 11/26/2018); Incision and drainage (Right, 03/06/2019); and Metatarsal head excision (Right, 05/15/2019). Mr. Paule has a current medication list which includes the following prescription(s): atorvastatin, buspirone, [facility] senna, dimenhydrinate, docusate sodium, famotidine, gabapentin, insulin aspart, insulin pump, linaclotide, lisinopril, metoclopramide, ondansetron, pantoprazole, pioglitazone, sertraline, tedizolid phosphate, tizanidine, vitamin b-12, and belbuca, and the following Facility-Administered Medications: DAPTOmycin (CUBICIN) 780 mg in sodium chloride 0.9 % IVPB. He  reports that he quit smoking about 8 months ago. His smoking use included cigarettes. He has a 17.00 pack-year smoking history. He has never used smokeless tobacco. He reports current alcohol use. He reports that he does not use drugs. Richard Mendoza has No Known Allergies.   HPI  Today, he is being contacted for medication management.  Patient is being contacted for medication management.  As last visit, belbuca was initiated at a dose of 300 mcg.  He is noticing mild to moderate benefit with this medication but was expecting greater pain relief.  I informed him that we are at a fairly low dose and that we could consider titrating up.  Patient states that he does experience lower energy levels during the morning.  I recommend that he try and delay his morning dose to the late afternoon and see if this has an impact on his activity and energy level.  Overall, patient wants to continue with the medication and is in agreement with escalating dose to try and optimize pain management.  I informed the patient that if he feels like his activity level or his energy worsens at a higher dose to contact me and that we may need to decrease  back to 300 mcg twice  daily.  Patient endorsed understanding.  Pharmacotherapy Assessment  Analgesic: Belbuca 300 mcg BID--> 450 mcg BID   Monitoring: Pharmacotherapy: decreased energy  [place]  PMP: PDMP reviewed during this encounter.       Compliance: No problems identified. Effectiveness: Clinically acceptable. Plan: Refer to "POC".  UDS:  Summary  Date Value Ref Range Status  07/16/2019 Note  Final    Comment:    ==================================================================== Compliance Drug Analysis, Ur ==================================================================== Test                             Result       Flag       Units Drug Present and Declared for Prescription Verification   Sertraline                     PRESENT      EXPECTED   Desmethylsertraline            PRESENT      EXPECTED    Desmethylsertraline is an expected metabolite of sertraline. Drug Present not Declared for Prescription Verification   Oxycodone                      101          UNEXPECTED ng/mg creat   Oxymorphone                    194          UNEXPECTED ng/mg creat   Noroxycodone                   466          UNEXPECTED ng/mg creat   Noroxymorphone                 83           UNEXPECTED ng/mg creat    Sources of oxycodone are scheduled prescription medications.    Oxymorphone, noroxycodone, and noroxymorphone are expected    metabolites of oxycodone. Oxymorphone is also available as a    scheduled prescription medication.   Acetaminophen                  PRESENT      UNEXPECTED Drug Absent but Declared for Prescription Verification   Gabapentin                     Not Detected UNEXPECTED   Tizanidine                     Not Detected UNEXPECTED    Tizanidine, as indicated in the declared medication list, is not    always detected even when used as directed.   Ibuprofen                      Not Detected UNEXPECTED    Ibuprofen, as indicated in the declared medication list, is not     always detected even when used as directed. ==================================================================== Test                      Result    Flag   Units      Ref Range   Creatinine              143              mg/dL      >=  20 ==================================================================== Declared Medications:  The flagging and interpretation on this report are based on the  following declared medications.  Unexpected results may arise from  inaccuracies in the declared medications.  **Note: The testing scope of this panel includes these medications:  Dimenhydrinate  Gabapentin  Sertraline (Zoloft)  **Note: The testing scope of this panel does not include small to  moderate amounts of these reported medications:  Ibuprofen  Tizanidine  **Note: The testing scope of this panel does not include the  following reported medications:  Atorvastatin  Buspirone  Cyanocobalamin  Docusate  Empagliflozin ([place]ard[place])  Famotidine (Pepcid)  Linaclotide  Lisinopril  Metoclopramide  Ondansetron  Pantoprazole  Pioglitazone  Sennosides (Senna) ==================================================================== For clinical consultation, please call [phone]. ====================================================================    Laboratory Chemistry Profile (12 mo)  Renal: 07/01/2019: BUN 28; Creatinine, Ser 1.62  Lab Results  Component Value Date   GFR 118.24 10/24/2011   GFRAA 59 (L) 07/01/2019   GFRNONAA 51 (L) 07/01/2019   Hepatic: 07/01/2019: Albumin 4.1 Lab Results  Component Value Date   AST 34 07/01/2019   ALT 24 07/01/2019   Other: 07/01/2019: CRP 2.3; Sed Rate 45 Note: Above Lab results reviewed.   Assessment  The primary encounter diagnosis was Primary osteoarthritis of both shoulders. Diagnoses of 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), Chronic pain of both shoulders, Cervical radicular pain, S/P cervical spinal fusion, Diabetic  neuropathy, type I diabetes mellitus (HCC), and Chronic pain syndrome were also pertinent to this visit.  Plan of Care  I have discontinued Richard Mendoza's Belbuca. I am also having him start on Belbuca. Additionally, I am having him maintain his atorvastatin, pantoprazole, tiZANidine, gabapentin, lisinopril, sertraline, [facility] Senna, insulin pump, famotidine, dimenhyDRINATE, insulin aspart, ondansetron, linaclotide, busPIRone, vitamin B-12, docusate sodium, Tedizolid Phosphate, pioglitazone, and metoCLOPramide. We will continue to administer DAPTOmycin (CUBICIN) 780 mg in sodium chloride 0.9 % IVPB.  Increased dose from 300 mcg to 450 mcg twice daily.  Monitor side effects particularly activity level and energy.  Continue multimodal analgesics with gabapentin 600 mg nightly, tizanidine 4 mg every 6 hours as needed along with psychiatric medications.  Pharmacotherapy (Medications Ordered): Meds ordered this encounter  Medications  . Buprenorphine HCl (BELBUCA) 450 MCG FILM    Sig: Place 1 Film (450 mcg total) inside cheek every 12 (twelve) hours.    Dispense:  60 each    Refill:  1    Follow-up plan:   Return in about 8 weeks (around 12/07/2019) for Medication Management, virtual.      Interventional management options: Planned, scheduled, and/or pending:    R suprascapular NB (09/21/2019) +/- RFA       Recent Visits Date Type Provider Dept  09/21/19 Procedure visit Edward JollyLateef, Shakim Faith, MD [facility]  09/07/19 Office Visit Edward JollyLateef, Rasheed Welty, MD [facility]  07/16/19 Office Visit Edward JollyLateef, Genie Wenke, MD [facility]  Showing recent visits within past 90 days and meeting all other requirements   Today's Visits Date Type Provider Dept  10/12/19 Office Visit Edward JollyLateef, Sharolyn Weber, MD [facility]  Showing today's visits and meeting all other requirements   Future Appointments No visits were found meeting these conditions.  Showing future appointments within next 90 days  and meeting all other requirements   I discussed the assessment and treatment plan with the patient. The patient was provided an opportunity to ask questions and all were answered. The patient agreed with the plan and demonstrated an understanding of the instructions.  Patient advised to  call back or seek an in-person evaluation if the symptoms or condition worsens.  Total duration of non-face-to-face encounter: .  Note by: Edward Jolly, MD Date: 10/12/2019; Time: 3:19 PM  Note: This dictation was prepared with Dragon dictation. Any transcriptional errors that may result from this process are unintentional.  Disclaimer:  * Given the special circumstances of the COVID-19 pandemic, the federal government has announced that the Office for Civil Rights (OCR) will exercise its enforcement discretion and will not impose penalties on physicians using telehealth in the event of noncompliance with regulatory requirements under the [employer] Portability and Accountability Act (HIPAA) in connection with the good faith provision of telehealth during the COVID-19 national public health emergency. (AMA)

## 2019-11-23 ENCOUNTER — Encounter: Payer: Self-pay | Admitting: Student in an Organized Health Care Education/Training Program

## 2019-12-03 ENCOUNTER — Encounter: Payer: Self-pay | Admitting: Student in an Organized Health Care Education/Training Program

## 2019-12-07 ENCOUNTER — Ambulatory Visit
Payer: Medicaid Other | Attending: Student in an Organized Health Care Education/Training Program | Admitting: Student in an Organized Health Care Education/Training Program

## 2019-12-07 ENCOUNTER — Other Ambulatory Visit: Payer: Self-pay

## 2019-12-07 DIAGNOSIS — G894 Chronic pain syndrome: Secondary | ICD-10-CM

## 2019-12-07 NOTE — Progress Notes (Signed)
I attempted to call the patient however no response. Wife answered phone, instructed her to tell her husband call front desk office at [phone] to reschedule appointment. -Dr Cherylann Ratel

## 2019-12-08 NOTE — Telephone Encounter (Signed)
Taken care of

## 2019-12-22 ENCOUNTER — Telehealth: Payer: Self-pay

## 2019-12-22 NOTE — Telephone Encounter (Signed)
Spoke to wife regarding Thumb infection. She states patient is on Doxy 100 mg bid, Amoxicillin 815/125 bid, and Sulfa bid. She wants to see if this combo works before bringing him in. He is not available to speak to. She has agreed to have him call or she will call when ready for OV . PCP was alerted. Son also showing bump on his side which she feels is connected to this and she has been advised to call his PCP.

## 2020-01-11 ENCOUNTER — Telehealth: Payer: Self-pay | Admitting: *Deleted

## 2020-05-05 ENCOUNTER — Emergency Department
Admission: EM | Admit: 2020-05-05 | Discharge: 2020-05-05 | Disposition: A | Discharge status: Home / Self Care | Payer: Medicaid Other | Attending: Emergency Medicine | Admitting: Emergency Medicine

## 2020-05-05 ENCOUNTER — Encounter: Payer: Self-pay | Admitting: Emergency Medicine

## 2020-05-05 ENCOUNTER — Other Ambulatory Visit: Payer: Self-pay

## 2020-05-05 ENCOUNTER — Emergency Department: Payer: Medicaid Other

## 2020-05-05 DIAGNOSIS — L02212 Cutaneous abscess of back [any part, except buttock]: Secondary | ICD-10-CM | POA: Diagnosis not present

## 2020-05-05 DIAGNOSIS — E860 Dehydration: Secondary | ICD-10-CM | POA: Diagnosis not present

## 2020-05-05 DIAGNOSIS — R110 Nausea: Secondary | ICD-10-CM | POA: Insufficient documentation

## 2020-05-05 DIAGNOSIS — E1165 Type 2 diabetes mellitus with hyperglycemia: Secondary | ICD-10-CM | POA: Diagnosis present

## 2020-05-05 DIAGNOSIS — Z9641 Presence of insulin pump (external) (internal): Secondary | ICD-10-CM | POA: Insufficient documentation

## 2020-05-05 DIAGNOSIS — R739 Hyperglycemia, unspecified: Secondary | ICD-10-CM

## 2020-05-05 DIAGNOSIS — Z79899 Other long term (current) drug therapy: Secondary | ICD-10-CM | POA: Diagnosis not present

## 2020-05-05 DIAGNOSIS — I10 Essential (primary) hypertension: Secondary | ICD-10-CM | POA: Insufficient documentation

## 2020-05-05 LAB — CBC WITH DIFFERENTIAL/PLATELET
Abs Immature Granulocytes: 0.14 10*3/uL — ABNORMAL HIGH (ref 0.00–0.07)
Basophils Absolute: 0 10*3/uL (ref 0.0–0.1)
Basophils Relative: 0 %
Eosinophils Absolute: 0 10*3/uL (ref 0.0–0.5)
Eosinophils Relative: 0 %
HCT: 40.7 % (ref 39.0–52.0)
Hemoglobin: 13.9 g/dL (ref 13.0–17.0)
Immature Granulocytes: 1 %
Lymphocytes Relative: 10 %
Lymphs Abs: 1.7 10*3/uL (ref 0.7–4.0)
MCH: 28.6 pg (ref 26.0–34.0)
MCHC: 34.2 g/dL (ref 30.0–36.0)
MCV: 83.7 fL (ref 80.0–100.0)
Monocytes Absolute: 0.4 10*3/uL (ref 0.1–1.0)
Monocytes Relative: 2 %
Neutro Abs: 14.9 10*3/uL — ABNORMAL HIGH (ref 1.7–7.7)
Neutrophils Relative %: 87 %
Platelets: 354 10*3/uL (ref 150–400)
RBC: 4.86 MIL/uL (ref 4.22–5.81)
RDW: 12.8 % (ref 11.5–15.5)
WBC: 17.1 10*3/uL — ABNORMAL HIGH (ref 4.0–10.5)
nRBC: 0 % (ref 0.0–0.2)

## 2020-05-05 LAB — COMPREHENSIVE METABOLIC PANEL
ALT: 23 U/L (ref 0–44)
AST: 19 U/L (ref 15–41)
Albumin: 4.1 g/dL (ref 3.5–5.0)
Alkaline Phosphatase: 140 U/L — ABNORMAL HIGH (ref 38–126)
Anion gap: 16 — ABNORMAL HIGH (ref 5–15)
BUN: 49 mg/dL — ABNORMAL HIGH (ref 6–20)
CO2: 23 mmol/L (ref 22–32)
Calcium: 9.5 mg/dL (ref 8.9–10.3)
Chloride: 86 mmol/L — ABNORMAL LOW (ref 98–111)
Creatinine, Ser: 1.86 mg/dL — ABNORMAL HIGH (ref 0.61–1.24)
GFR calc Af Amer: 50 mL/min — ABNORMAL LOW (ref >60)
GFR calc non Af Amer: 43 mL/min — ABNORMAL LOW (ref >60)
Glucose, Bld: 471 mg/dL — ABNORMAL HIGH (ref 70–99)
Potassium: 4.3 mmol/L (ref 3.5–5.1)
Sodium: 125 mmol/L — ABNORMAL LOW (ref 135–145)
Total Bilirubin: 1 mg/dL (ref 0.3–1.2)
Total Protein: 8.8 g/dL — ABNORMAL HIGH (ref 6.5–8.1)

## 2020-05-05 LAB — URINALYSIS, COMPLETE (UACMP) WITH MICROSCOPIC
Bacteria, UA: NONE SEEN
Bilirubin Urine: NEGATIVE
Glucose, UA: >=500 mg/dL — AB
Ketones, ur: NEGATIVE mg/dL
Leukocytes,Ua: NEGATIVE
Nitrite: NEGATIVE
Protein, ur: NEGATIVE mg/dL
Specific Gravity, Urine: 1.022 (ref 1.005–1.030)
pH: 5 (ref 5.0–8.0)

## 2020-05-05 LAB — BASIC METABOLIC PANEL
Anion gap: 7 (ref 5–15)
BUN: 43 mg/dL — ABNORMAL HIGH (ref 6–20)
CO2: 25 mmol/L (ref 22–32)
Calcium: 8.3 mg/dL — ABNORMAL LOW (ref 8.9–10.3)
Chloride: 98 mmol/L (ref 98–111)
Creatinine, Ser: 1.55 mg/dL — ABNORMAL HIGH (ref 0.61–1.24)
GFR calc Af Amer: >60 mL/min (ref >60)
GFR calc non Af Amer: 54 mL/min — ABNORMAL LOW (ref >60)
Glucose, Bld: 225 mg/dL — ABNORMAL HIGH (ref 70–99)
Potassium: 3.7 mmol/L (ref 3.5–5.1)
Sodium: 130 mmol/L — ABNORMAL LOW (ref 135–145)

## 2020-05-05 LAB — GLUCOSE, CAPILLARY
Glucose-Capillary: 494 mg/dL — ABNORMAL HIGH (ref 70–99)
Glucose-Capillary: 291 mg/dL — ABNORMAL HIGH (ref 70–99)

## 2020-05-05 LAB — LACTIC ACID, PLASMA: Lactic Acid, Venous: 1.8 mmol/L (ref 0.5–1.9)

## 2020-05-05 LAB — BLOOD GAS, VENOUS
Acid-Base Excess: 1.3 mmol/L (ref 0.0–2.0)
Bicarbonate: 27.2 mmol/L (ref 20.0–28.0)
O2 Saturation: 52.8 %
Patient temperature: 37
pCO2, Ven: 47 mmHg (ref 44.0–60.0)
pH, Ven: 7.37 (ref 7.250–7.430)
pO2, Ven: <31 mmHg — CL (ref 32.0–45.0)

## 2020-05-05 MED ORDER — ACETAMINOPHEN 500 MG PO TABS
1000.0000 mg | ORAL_TABLET | Freq: Once | ORAL | Status: AC
  Administered 2020-05-05: 1000 mg via ORAL
  Filled 2020-05-05: qty 2

## 2020-05-05 MED ORDER — CEPHALEXIN 500 MG PO CAPS: 500.0000 mg | ORAL_CAPSULE | Freq: Four times a day (QID) | ORAL | 0 refills | Status: AC

## 2020-05-05 MED ORDER — SODIUM CHLORIDE 0.9 % IV BOLUS
1000.0000 mL | Freq: Once | INTRAVENOUS | Status: AC
  Administered 2020-05-05: 1000 mL via INTRAVENOUS

## 2020-05-05 MED ORDER — PROCHLORPERAZINE EDISYLATE 10 MG/2ML IJ SOLN
10.0000 mg | Freq: Once | INTRAMUSCULAR | Status: AC
  Administered 2020-05-05: 10 mg via INTRAVENOUS
  Filled 2020-05-05: qty 2

## 2020-05-05 MED ORDER — SODIUM CHLORIDE 0.9% FLUSH: 3.0000 mL | Freq: Once | INTRAVENOUS | Status: DC

## 2020-05-05 MED ORDER — VANCOMYCIN HCL IN DEXTROSE 1-5 GM/200ML-% IV SOLN
1000.0000 mg | Freq: Once | INTRAVENOUS | Status: AC
  Administered 2020-05-05: 1000 mg via INTRAVENOUS
  Filled 2020-05-05: qty 200

## 2020-05-05 NOTE — Discharge Instructions (Signed)
Please seek medical attention for any high fevers, chest pain, shortness of breath, change in behavior, persistent vomiting, bloody stool or any other new or concerning symptoms.  

## 2020-05-05 NOTE — ED Notes (Signed)
Pt states his main complaint at this time is a headache, 10/10, started yesterday.  Ibuprofen taken this morning did not help.

## 2020-05-05 NOTE — ED Notes (Signed)
Critical value reported to Dr. Derrill Kay at this time  PO2 29

## 2020-05-05 NOTE — ED Triage Notes (Signed)
Arrives from [facility] for ED evaluation of hyperglycemia.  Patient states he had an abscess I+D on Saturday, abscess to back.  He was started on antibiotics, but has not been able to take them for the past three days due to vomiting.  Patient is an IDDM and his blood sugars have been running high for 3 days.  Arrives c/o weakness

## 2020-05-05 NOTE — ED Provider Notes (Signed)
[facility] Emergency Department Provider Note   ____________________________________________   I have reviewed the triage vital signs and the nursing notes.   HISTORY  Chief Complaint Hyperglycemia   History limited by: Not Limited   HPI Richard Mendoza is a 44 y.o. male who presents to the emergency department today with concern for weakness, headache, hyperglycemia. The patient states that he had abscess on his back drained 3 days ago. Since then he has felt increasingly weak. He was put on antibiotics for the abscess but has not been able to keep them down because of nausea. The patient has had elevated blood sugars, as high as 500-600 over the past couple of days. He does use insulin. He denies any fevers. Does feel like the abscess might be getting better.   Records reviewed. Per medical record review patient has a history of DM, HLD, HTN.  Past Medical History:  Diagnosis Date  . DIABETES MELLITUS, TYPE II, UNCONTROLLED 03/17/2009  . DM 12/08/2008  . HYPERLIPIDEMIA 03/17/2009  . HYPERTENSION 12/08/2008  . 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 Pain management contract signed 09/07/2019  . Evaluation by psychiatric service required 08/18/2019  . History of depression 08/18/2019  . Primary osteoarthritis of both shoulders 07/16/2019  . 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 Chronic pain of both shoulders 07/16/2019  . Cervical radicular pain 07/16/2019  . S/P cervical spinal fusion 07/16/2019  . Cervical spondylosis 07/16/2019  . Cervical facet joint syndrome 07/16/2019  . Chronic pain syndrome 07/16/2019  . Anemia 05/25/2019  . Osteomyelitis of right foot ([place]) 05/25/2019  . Diabetic foot infection ([place]) 03/05/2019  . Abscess 11/25/2018  . Diabetic peripheral neuropathy associated with type 2 diabetes mellitus ([place]) 06/16/2018  . Hyperlipidemia associated with type 2 diabetes  mellitus ([place]) 06/16/2018  . Intractable nausea and vomiting 10/21/2017  . HNP (herniated nucleus pulposus), cervical 09/09/2017  . Erectile dysfunction 07/31/2017  . Gastroesophageal reflux disease 07/31/2017  . Rotator cuff syndrome of left shoulder 07/31/2017  . Cellulitis of right leg 07/30/2016  . Tobacco abuse disorder 07/30/2016  . Routine general medical examination at a health care facility 01/20/2014  . Diabetic neuropathy, type I diabetes mellitus ([place]) 01/20/2014  . 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 HYPERLIPIDEMIA 03/17/2009  . DM type 2 with diabetic peripheral neuropathy ([place]) 03/02/2009  . DM 12/08/2008  . OBSTRUCTIVE SLEEP APNEA 12/08/2008  . Essential hypertension 12/08/2008  . Allergic rhinitis 12/15/2007    Past Surgical History:  Procedure Laterality Date  . ANTERIOR CERVICAL DECOMP/DISCECTOMY FUSION N/A 09/09/2017   Procedure: ANTERIOR CERVICAL DECOMPRESSION/DISCECTOMY FUSION CERVICAL 6- CERVICAL 7;  Surgeon: Ashok Pall, MD;  Location: [place];  Service: Neurosurgery;  Laterality: N/A;  ANTERIOR CERVICAL DECOMPRESSION/DISCECTOMY FUSION CERVICAL 6- CERVICAL 7  . APPENDECTOMY    . I & D EXTREMITY Right 10/03/2017   Procedure: IRRIGATION AND DEBRIDEMENT RIGHT WRIST;  Surgeon: Leanora Cover, MD;  Location: [place];  Service: Orthopedics;  Laterality: Right;  . I & D EXTREMITY Right 11/26/2018   Procedure: IRRIGATION AND DEBRIDEMENT FASCIA ON RIGHT FOOT;  Surgeon: Samara Deist, DPM;  Location: [facility];  Service: Podiatry;  Laterality: Right;  . 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 METATARSAL HEAD EXCISION Right 05/15/2019   Procedure: OSTECTOMY;MET HEAD  5;  Surgeon: Samara Deist, DPM;  Location: [facility];  Service: Podiatry;  Laterality: Right;  . osteomylitis    . ROTATOR CUFF REPAIR Left     Prior to  Admission medications   Medication Sig Start Date End Date Taking? Authorizing Provider  atorvastatin (LIPITOR) 20 MG tablet Take 20 mg by mouth daily. 08/23/17   [provider]  Buprenorphine HCl (BELBUCA) 450 MCG FILM Place 1 Film (450 mcg total) inside cheek every 12 (twelve) hours. 10/12/19   Gillis Santa, MD  busPIRone (BUSPAR) 15 MG tablet Take 15 mg by mouth 2 (two) times daily.    [provider]  [facility] SENNA 8.6 MG tablet Take 1 tablet by mouth 2 (two) times daily as needed for constipation.  10/31/17   [provider]  dimenhyDRINATE (DRAMAMINE) 50 MG tablet Take 50 mg by mouth 2 (two) times daily as needed for itching or dizziness.     [provider]  docusate sodium (COLACE) 100 MG capsule Take 1 capsule (100 mg total) by mouth 2 (two) times daily as needed for mild constipation. 03/10/19   Nicholes Mango, MD  famotidine (PEPCID) 20 MG tablet Take 20 mg by mouth 2 (two) times daily.    [provider]  gabapentin (NEURONTIN) 300 MG capsule Take 600 mg by mouth at bedtime.     [provider]  insulin aspart (NOVOLOG) 100 UNIT/ML injection Inject into the skin 3 (three) times daily before meals. Via pump    [provider]  Insulin Human (INSULIN PUMP) SOLN Inject 2.5 each into the skin every hour.    [provider]  linaclotide (LINZESS) 72 MCG capsule Take 72 mcg by mouth daily as needed (constipation).    [provider]  lisinopril (PRINIVIL,ZESTRIL) 5 MG tablet Take 5 mg by mouth daily. 10/14/17   [provider]  metoCLOPramide (REGLAN) 10 MG tablet Take 10 mg by mouth daily.  05/25/19   [provider]  ondansetron (ZOFRAN) 8 MG tablet Take 8 mg by mouth every 6 (six) hours as needed for nausea/vomiting.    [provider]  pantoprazole (PROTONIX) 40 MG tablet Take 40 mg by mouth daily. 08/23/17   [provider]  pioglitazone (ACTOS) 30 MG tablet Take 30 mg by mouth  daily. 06/29/19   [provider]  sertraline (ZOLOFT) 100 MG tablet Take 100 mg by mouth daily.  10/31/17   [provider]  Tedizolid Phosphate 200 MG TABS Take 1 tablet by mouth daily. 05/28/19   Tsosie Billing, MD  tiZANidine (ZANAFLEX) 4 MG tablet Take 1 tablet (4 mg total) by mouth every 6 (six) hours as needed for muscle spasms. 09/10/17   Ashok Pall, MD  vitamin B-12 (CYANOCOBALAMIN) 1000 MCG tablet Take 1,000 mcg by mouth daily. 01/27/19   [provider]    Allergies Patient has no known allergies.  Family History  Problem Relation Age of Onset  . Diabetes Mother   . Heart disease Father   . Diabetes Father   . Arthritis Other   . Hyperlipidemia Other   . Hypertension Other   . Cancer Other        breast  . Mental illness Neg Hx     Social History Social History   Tobacco Use  . Smoking status: Former Smoker    Packs/day: 1.00    Years: 17.00    Pack years: 17.00    Types: Cigarettes    Quit date: 01/14/2019  Years since quitting: 1.3  . Smokeless tobacco: Never Used  Vaping Use  . Vaping Use: Never used  Substance Use Topics  . Alcohol use: Yes    Comment: rare  . Drug use: No    Review of Systems Constitutional: No fever/chills Eyes: No visual changes. ENT: No sore throat. Cardiovascular: Denies chest pain. Respiratory: Denies shortness of breath. Gastrointestinal: No abdominal pain.  Positive for nausea and vomiting.  Genitourinary: Negative for dysuria. Musculoskeletal: Positive for back pain. Skin: Positive for abscess.  Neurological: Positive for headache.   ____________________________________________   PHYSICAL EXAM:  VITAL SIGNS: ED Triage Vitals  Enc Vitals Group     BP 05/05/20 1016 (!) 167/107     Pulse Rate 05/05/20 1016 (!) 105     Resp 05/05/20 1016 18     Temp 05/05/20 1016 98 F (36.7 C)     Temp Source 05/05/20 1016 Oral     SpO2 05/05/20 1016 100 %     Weight 05/05/20 1013 210 lb  (95.3 kg)     Height 05/05/20 1013 5' 10"  (1.778 m)     Head Circumference --      Peak Flow --      Pain Score 05/05/20 1012 9   Constitutional: Alert and oriented.  Eyes: Conjunctivae are normal.  ENT      Head: Normocephalic and atraumatic.      Nose: No congestion/rhinnorhea.      Mouth/Throat: Mucous membranes are moist.      Neck: No stridor. Hematological/Lymphatic/Immunilogical: No cervical lymphadenopathy. Cardiovascular: Tachycardia, regular rhythm.  No murmurs, rubs, or gallops.  Respiratory: Normal respiratory effort without tachypnea nor retractions. Breath sounds are clear and equal bilaterally. No wheezes/rales/rhonchi. Gastrointestinal: Soft and non tender. No rebound. No guarding.  Genitourinary: Deferred Musculoskeletal: Normal range of motion in all extremities. No lower extremity edema. Neurologic:  Normal speech and language. No gross focal neurologic deficits are appreciated.  Skin: Site of abscess on back with some surrounding erythema, wound is open and appears to be draining small amount of pus like material.  Psychiatric: Mood and affect are normal. Speech and behavior are normal. Patient exhibits appropriate insight and judgment.  ____________________________________________    LABS (pertinent positives/negatives)  UA clear, small hgb dipstick Lactic acid 1.8 CBC wbc 17.1, hgb 13.9, plt 354 CMP na 125, k 4.3, glu 471, cr 1.86 ____________________________________________   EKG  None  ____________________________________________    RADIOLOGY  CXR No acute cardiopulmonary disease  ____________________________________________   PROCEDURES  Procedures  ____________________________________________   INITIAL IMPRESSION / ASSESSMENT AND PLAN / ED COURSE  Pertinent labs & imaging results that were available during my care of the patient were reviewed by me and considered in my medical decision making (see chart for details).   Patient  presented to the emergency department today because of concerns for hyperglycemia.  He had a recent abscess drained.  On exam the abscess site actually looks fairly good.  It is still draining.  No significant surrounding erythema.  Patient does state however that he has not been able to take his medication because of his nausea.  Blood work does show elevated glucose and anion gap however no ketones in urine and patient's pH is within normal limits.  Patient was given IV fluids which did improve his sugar level and his anion gap normalized.  Patient was given dose of IV antibiotics here.  Patient did feel better after IV fluids and medication.  Will discharge home.  Will  give patient extra Keflex to complete his course.  ____________________________________________   FINAL CLINICAL IMPRESSION(S) / ED DIAGNOSES  Final diagnoses:  Hyperglycemia  Dehydration     Note: This dictation was prepared with Dragon dictation. Any transcriptional errors that result from this process are unintentional     Nance Pear, MD 05/05/20 [phone]

## 2020-07-07 ENCOUNTER — Ambulatory Visit: Payer: Medicaid Other | Attending: Internal Medicine

## 2020-07-07 DIAGNOSIS — G4733 Obstructive sleep apnea (adult) (pediatric): Secondary | ICD-10-CM | POA: Insufficient documentation

## 2020-07-08 ENCOUNTER — Other Ambulatory Visit: Payer: Self-pay

## 2020-09-01 DIAGNOSIS — F3340 Major depressive disorder, recurrent, in remission, unspecified: Secondary | ICD-10-CM | POA: Insufficient documentation

## 2020-09-01 DIAGNOSIS — Z8616 Personal history of COVID-19: Secondary | ICD-10-CM | POA: Insufficient documentation

## 2020-11-07 ENCOUNTER — Encounter
Admission: EM | Disposition: A | Discharge status: Home Health | Payer: Self-pay | Source: Home / Self Care | Attending: Internal Medicine

## 2020-11-07 ENCOUNTER — Other Ambulatory Visit: Payer: Self-pay

## 2020-11-07 ENCOUNTER — Inpatient Hospital Stay: Payer: Medicaid Other | Admitting: Anesthesiology

## 2020-11-07 ENCOUNTER — Emergency Department: Payer: Medicaid Other

## 2020-11-07 ENCOUNTER — Encounter: Payer: Self-pay | Admitting: Emergency Medicine

## 2020-11-07 ENCOUNTER — Inpatient Hospital Stay
Admission: EM | Admit: 2020-11-07 | Discharge: 2020-11-21 | DRG: 853 | Disposition: A | Discharge status: Home Health | Payer: Medicaid Other | Attending: Internal Medicine | Admitting: Internal Medicine

## 2020-11-07 DIAGNOSIS — I10 Essential (primary) hypertension: Secondary | ICD-10-CM | POA: Diagnosis present

## 2020-11-07 DIAGNOSIS — E785 Hyperlipidemia, unspecified: Secondary | ICD-10-CM | POA: Diagnosis present

## 2020-11-07 DIAGNOSIS — B951 Streptococcus, group B, as the cause of diseases classified elsewhere: Secondary | ICD-10-CM | POA: Diagnosis not present

## 2020-11-07 DIAGNOSIS — Z794 Long term (current) use of insulin: Secondary | ICD-10-CM | POA: Diagnosis not present

## 2020-11-07 DIAGNOSIS — D62 Acute posthemorrhagic anemia: Secondary | ICD-10-CM | POA: Diagnosis not present

## 2020-11-07 DIAGNOSIS — M726 Necrotizing fasciitis: Secondary | ICD-10-CM | POA: Diagnosis present

## 2020-11-07 DIAGNOSIS — G546 Phantom limb syndrome with pain: Secondary | ICD-10-CM | POA: Diagnosis not present

## 2020-11-07 DIAGNOSIS — L02612 Cutaneous abscess of left foot: Secondary | ICD-10-CM | POA: Diagnosis present

## 2020-11-07 DIAGNOSIS — G43909 Migraine, unspecified, not intractable, without status migrainosus: Secondary | ICD-10-CM | POA: Diagnosis present

## 2020-11-07 DIAGNOSIS — E1152 Type 2 diabetes mellitus with diabetic peripheral angiopathy with gangrene: Secondary | ICD-10-CM | POA: Diagnosis present

## 2020-11-07 DIAGNOSIS — E1165 Type 2 diabetes mellitus with hyperglycemia: Secondary | ICD-10-CM | POA: Diagnosis present

## 2020-11-07 DIAGNOSIS — L0889 Other specified local infections of the skin and subcutaneous tissue: Secondary | ICD-10-CM | POA: Diagnosis not present

## 2020-11-07 DIAGNOSIS — E11628 Type 2 diabetes mellitus with other skin complications: Secondary | ICD-10-CM | POA: Diagnosis present

## 2020-11-07 DIAGNOSIS — Z833 Family history of diabetes mellitus: Secondary | ICD-10-CM

## 2020-11-07 DIAGNOSIS — N1831 Chronic kidney disease, stage 3a: Secondary | ICD-10-CM | POA: Diagnosis present

## 2020-11-07 DIAGNOSIS — M86172 Other acute osteomyelitis, left ankle and foot: Secondary | ICD-10-CM | POA: Diagnosis present

## 2020-11-07 DIAGNOSIS — L97509 Non-pressure chronic ulcer of other part of unspecified foot with unspecified severity: Secondary | ICD-10-CM | POA: Diagnosis not present

## 2020-11-07 DIAGNOSIS — E538 Deficiency of other specified B group vitamins: Secondary | ICD-10-CM | POA: Diagnosis present

## 2020-11-07 DIAGNOSIS — A480 Gas gangrene: Secondary | ICD-10-CM | POA: Diagnosis present

## 2020-11-07 DIAGNOSIS — M545 Low back pain, unspecified: Secondary | ICD-10-CM | POA: Diagnosis not present

## 2020-11-07 DIAGNOSIS — E1169 Type 2 diabetes mellitus with other specified complication: Secondary | ICD-10-CM | POA: Diagnosis present

## 2020-11-07 DIAGNOSIS — Z89422 Acquired absence of other left toe(s): Secondary | ICD-10-CM | POA: Diagnosis not present

## 2020-11-07 DIAGNOSIS — E1142 Type 2 diabetes mellitus with diabetic polyneuropathy: Secondary | ICD-10-CM | POA: Diagnosis present

## 2020-11-07 DIAGNOSIS — N189 Chronic kidney disease, unspecified: Secondary | ICD-10-CM | POA: Diagnosis not present

## 2020-11-07 DIAGNOSIS — E1065 Type 1 diabetes mellitus with hyperglycemia: Secondary | ICD-10-CM | POA: Diagnosis not present

## 2020-11-07 DIAGNOSIS — E876 Hypokalemia: Secondary | ICD-10-CM | POA: Diagnosis present

## 2020-11-07 DIAGNOSIS — A401 Sepsis due to streptococcus, group B: Secondary | ICD-10-CM | POA: Diagnosis present

## 2020-11-07 DIAGNOSIS — A419 Sepsis, unspecified organism: Secondary | ICD-10-CM

## 2020-11-07 DIAGNOSIS — N179 Acute kidney failure, unspecified: Secondary | ICD-10-CM | POA: Diagnosis present

## 2020-11-07 DIAGNOSIS — Z6832 Body mass index (BMI) 32.0-32.9, adult: Secondary | ICD-10-CM

## 2020-11-07 DIAGNOSIS — Z9641 Presence of insulin pump (external) (internal): Secondary | ICD-10-CM | POA: Diagnosis present

## 2020-11-07 DIAGNOSIS — E1122 Type 2 diabetes mellitus with diabetic chronic kidney disease: Secondary | ICD-10-CM | POA: Diagnosis not present

## 2020-11-07 DIAGNOSIS — Z8614 Personal history of Methicillin resistant Staphylococcus aureus infection: Secondary | ICD-10-CM | POA: Diagnosis not present

## 2020-11-07 DIAGNOSIS — E11621 Type 2 diabetes mellitus with foot ulcer: Secondary | ICD-10-CM | POA: Diagnosis not present

## 2020-11-07 DIAGNOSIS — M5127 Other intervertebral disc displacement, lumbosacral region: Secondary | ICD-10-CM | POA: Diagnosis present

## 2020-11-07 DIAGNOSIS — L089 Local infection of the skin and subcutaneous tissue, unspecified: Secondary | ICD-10-CM | POA: Diagnosis not present

## 2020-11-07 DIAGNOSIS — R7881 Bacteremia: Secondary | ICD-10-CM | POA: Diagnosis not present

## 2020-11-07 DIAGNOSIS — Z20822 Contact with and (suspected) exposure to covid-19: Secondary | ICD-10-CM | POA: Diagnosis present

## 2020-11-07 DIAGNOSIS — D631 Anemia in chronic kidney disease: Secondary | ICD-10-CM | POA: Diagnosis present

## 2020-11-07 DIAGNOSIS — E871 Hypo-osmolality and hyponatremia: Secondary | ICD-10-CM | POA: Diagnosis present

## 2020-11-07 DIAGNOSIS — E119 Type 2 diabetes mellitus without complications: Secondary | ICD-10-CM | POA: Diagnosis not present

## 2020-11-07 DIAGNOSIS — R739 Hyperglycemia, unspecified: Secondary | ICD-10-CM

## 2020-11-07 DIAGNOSIS — K219 Gastro-esophageal reflux disease without esophagitis: Secondary | ICD-10-CM | POA: Diagnosis present

## 2020-11-07 DIAGNOSIS — E669 Obesity, unspecified: Secondary | ICD-10-CM | POA: Diagnosis present

## 2020-11-07 DIAGNOSIS — E878 Other disorders of electrolyte and fluid balance, not elsewhere classified: Secondary | ICD-10-CM | POA: Diagnosis present

## 2020-11-07 DIAGNOSIS — Z981 Arthrodesis status: Secondary | ICD-10-CM

## 2020-11-07 DIAGNOSIS — Z89512 Acquired absence of left leg below knee: Secondary | ICD-10-CM

## 2020-11-07 DIAGNOSIS — L03032 Cellulitis of left toe: Secondary | ICD-10-CM | POA: Diagnosis present

## 2020-11-07 DIAGNOSIS — Z89421 Acquired absence of other right toe(s): Secondary | ICD-10-CM

## 2020-11-07 DIAGNOSIS — I129 Hypertensive chronic kidney disease with stage 1 through stage 4 chronic kidney disease, or unspecified chronic kidney disease: Secondary | ICD-10-CM | POA: Diagnosis present

## 2020-11-07 DIAGNOSIS — B9689 Other specified bacterial agents as the cause of diseases classified elsewhere: Secondary | ICD-10-CM | POA: Diagnosis not present

## 2020-11-07 DIAGNOSIS — Z79899 Other long term (current) drug therapy: Secondary | ICD-10-CM

## 2020-11-07 DIAGNOSIS — Z87891 Personal history of nicotine dependence: Secondary | ICD-10-CM

## 2020-11-07 DIAGNOSIS — D649 Anemia, unspecified: Secondary | ICD-10-CM | POA: Diagnosis not present

## 2020-11-07 DIAGNOSIS — I96 Gangrene, not elsewhere classified: Secondary | ICD-10-CM | POA: Diagnosis not present

## 2020-11-07 DIAGNOSIS — Z8249 Family history of ischemic heart disease and other diseases of the circulatory system: Secondary | ICD-10-CM

## 2020-11-07 DIAGNOSIS — M8610 Other acute osteomyelitis, unspecified site: Secondary | ICD-10-CM | POA: Diagnosis not present

## 2020-11-07 DIAGNOSIS — E1100 Type 2 diabetes mellitus with hyperosmolarity without nonketotic hyperglycemic-hyperosmolar coma (NKHHC): Secondary | ICD-10-CM | POA: Diagnosis not present

## 2020-11-07 HISTORY — PX: AMPUTATION: SHX166

## 2020-11-07 HISTORY — PX: INCISION AND DRAINAGE: SHX5863

## 2020-11-07 LAB — CBC WITH DIFFERENTIAL/PLATELET
Abs Immature Granulocytes: 0.15 10*3/uL — ABNORMAL HIGH (ref 0.00–0.07)
Basophils Absolute: 0 10*3/uL (ref 0.0–0.1)
Basophils Relative: 0 %
Eosinophils Absolute: 0 10*3/uL (ref 0.0–0.5)
Eosinophils Relative: 0 %
HCT: 31.6 % — ABNORMAL LOW (ref 39.0–52.0)
Hemoglobin: 10.1 g/dL — ABNORMAL LOW (ref 13.0–17.0)
Immature Granulocytes: 1 %
Lymphocytes Relative: 5 %
Lymphs Abs: 0.8 10*3/uL (ref 0.7–4.0)
MCH: 26.7 pg (ref 26.0–34.0)
MCHC: 32 g/dL (ref 30.0–36.0)
MCV: 83.6 fL (ref 80.0–100.0)
Monocytes Absolute: 0.7 10*3/uL (ref 0.1–1.0)
Monocytes Relative: 5 %
Neutro Abs: 12.9 10*3/uL — ABNORMAL HIGH (ref 1.7–7.7)
Neutrophils Relative %: 89 %
Platelets: 346 10*3/uL (ref 150–400)
RBC: 3.78 MIL/uL — ABNORMAL LOW (ref 4.22–5.81)
RDW: 14.4 % (ref 11.5–15.5)
WBC: 14.5 10*3/uL — ABNORMAL HIGH (ref 4.0–10.5)
nRBC: 0 % (ref 0.0–0.2)

## 2020-11-07 LAB — COMPREHENSIVE METABOLIC PANEL
ALT: 17 U/L (ref 0–44)
AST: 25 U/L (ref 15–41)
Albumin: 2.7 g/dL — ABNORMAL LOW (ref 3.5–5.0)
Alkaline Phosphatase: 134 U/L — ABNORMAL HIGH (ref 38–126)
Anion gap: 14 (ref 5–15)
BUN: 16 mg/dL (ref 6–20)
CO2: 25 mmol/L (ref 22–32)
Calcium: 8.9 mg/dL (ref 8.9–10.3)
Chloride: 82 mmol/L — ABNORMAL LOW (ref 98–111)
Creatinine, Ser: 1.63 mg/dL — ABNORMAL HIGH (ref 0.61–1.24)
GFR, Estimated: 53 mL/min — ABNORMAL LOW (ref >60)
Glucose, Bld: 619 mg/dL (ref 70–99)
Potassium: 3.4 mmol/L — ABNORMAL LOW (ref 3.5–5.1)
Sodium: 121 mmol/L — ABNORMAL LOW (ref 135–145)
Total Bilirubin: 1.1 mg/dL (ref 0.3–1.2)
Total Protein: 8.5 g/dL — ABNORMAL HIGH (ref 6.5–8.1)

## 2020-11-07 LAB — URINALYSIS, COMPLETE (UACMP) WITH MICROSCOPIC
Bilirubin Urine: NEGATIVE
Glucose, UA: >=500 mg/dL — AB
Ketones, ur: NEGATIVE mg/dL
Leukocytes,Ua: NEGATIVE
Nitrite: NEGATIVE
Protein, ur: 30 mg/dL — AB
Specific Gravity, Urine: 1.027 (ref 1.005–1.030)
Squamous Epithelial / HPF: NONE SEEN (ref 0–5)
pH: 6 (ref 5.0–8.0)

## 2020-11-07 LAB — CBG MONITORING, ED
Glucose-Capillary: 498 mg/dL — ABNORMAL HIGH (ref 70–99)
Glucose-Capillary: 444 mg/dL — ABNORMAL HIGH (ref 70–99)

## 2020-11-07 LAB — APTT: aPTT: 34 seconds (ref 24–36)

## 2020-11-07 LAB — RESP PANEL BY RT-PCR (FLU A&B, COVID) ARPGX2
Influenza A by PCR: NEGATIVE
Influenza B by PCR: NEGATIVE
SARS Coronavirus 2 by RT PCR: NEGATIVE

## 2020-11-07 LAB — GLUCOSE, CAPILLARY
Glucose-Capillary: 375 mg/dL — ABNORMAL HIGH (ref 70–99)
Glucose-Capillary: 406 mg/dL — ABNORMAL HIGH (ref 70–99)

## 2020-11-07 LAB — PROTIME-INR
INR: 1.2 (ref 0.8–1.2)
Prothrombin Time: 14.8 seconds (ref 11.4–15.2)

## 2020-11-07 LAB — LACTIC ACID, PLASMA: Lactic Acid, Venous: 1.8 mmol/L (ref 0.5–1.9)

## 2020-11-07 SURGERY — INCISION AND DRAINAGE
Anesthesia: General | Site: Third Toe | Laterality: Left

## 2020-11-07 MED ORDER — SODIUM CHLORIDE 0.9 % IR SOLN
Status: DC | PRN
  Administered 2020-11-07: 500 mL

## 2020-11-07 MED ORDER — ROCURONIUM BROMIDE 100 MG/10ML IV SOLN
INTRAVENOUS | Status: DC | PRN
  Administered 2020-11-07: 30 mg via INTRAVENOUS

## 2020-11-07 MED ORDER — PANTOPRAZOLE SODIUM 40 MG PO TBEC
40.0000 mg | DELAYED_RELEASE_TABLET | Freq: Every day | ORAL | Status: DC
  Administered 2020-11-09 – 2020-11-21 (×12): 40 mg via ORAL
  Filled 2020-11-07 (×13): qty 1

## 2020-11-07 MED ORDER — ONDANSETRON HCL 4 MG PO TABS: 8.0000 mg | ORAL_TABLET | Freq: Three times a day (TID) | ORAL | Status: DC | PRN

## 2020-11-07 MED ORDER — ONDANSETRON HCL 4 MG PO TABS: 4.0000 mg | ORAL_TABLET | Freq: Four times a day (QID) | ORAL | Status: DC | PRN

## 2020-11-07 MED ORDER — BUSPIRONE HCL 10 MG PO TABS
15.0000 mg | ORAL_TABLET | Freq: Two times a day (BID) | ORAL | Status: DC
Start: 2020-11-07 — End: 2020-11-22
  Administered 2020-11-08 – 2020-11-21 (×26): 15 mg via ORAL
  Filled 2020-11-07: qty 2
  Filled 2020-11-07: qty 3
  Filled 2020-11-07: qty 1
  Filled 2020-11-07: qty 2
  Filled 2020-11-07: qty 1
  Filled 2020-11-07: qty 3
  Filled 2020-11-07: qty 1
  Filled 2020-11-07 (×3): qty 2
  Filled 2020-11-07: qty 1
  Filled 2020-11-07 (×2): qty 2
  Filled 2020-11-07 (×2): qty 1
  Filled 2020-11-07: qty 2
  Filled 2020-11-07: qty 3
  Filled 2020-11-07 (×3): qty 1
  Filled 2020-11-07 (×2): qty 2
  Filled 2020-11-07: qty 1
  Filled 2020-11-07: qty 3
  Filled 2020-11-07 (×2): qty 1
  Filled 2020-11-07: qty 2

## 2020-11-07 MED ORDER — VANCOMYCIN HCL 2000 MG/400ML IV SOLN: 2000.0000 mg | INTRAVENOUS | Status: DC

## 2020-11-07 MED ORDER — SERTRALINE HCL 50 MG PO TABS
100.0000 mg | ORAL_TABLET | Freq: Every day | ORAL | Status: DC
  Administered 2020-11-08 – 2020-11-21 (×13): 100 mg via ORAL
  Filled 2020-11-07 (×13): qty 2

## 2020-11-07 MED ORDER — MAGNESIUM HYDROXIDE 400 MG/5ML PO SUSP
30.0000 mL | Freq: Every day | ORAL | Status: DC | PRN
  Filled 2020-11-07: qty 30

## 2020-11-07 MED ORDER — ACETAMINOPHEN 325 MG PO TABS
650.0000 mg | ORAL_TABLET | Freq: Four times a day (QID) | ORAL | Status: DC | PRN
  Administered 2020-11-13 – 2020-11-21 (×2): 650 mg via ORAL
  Filled 2020-11-07: qty 2

## 2020-11-07 MED ORDER — ONDANSETRON HCL 4 MG/2ML IJ SOLN: 4.0000 mg | Freq: Four times a day (QID) | INTRAMUSCULAR | Status: DC | PRN

## 2020-11-07 MED ORDER — FENTANYL CITRATE (PF) 100 MCG/2ML IJ SOLN
INTRAMUSCULAR | Status: DC | PRN
  Administered 2020-11-07: 50 ug via INTRAVENOUS
  Administered 2020-11-07: 100 ug via INTRAVENOUS

## 2020-11-07 MED ORDER — OXYCODONE HCL 5 MG PO TABS: 5.0000 mg | ORAL_TABLET | Freq: Once | ORAL | Status: DC | PRN

## 2020-11-07 MED ORDER — VITAMIN B-12 1000 MCG PO TABS
500.0000 ug | ORAL_TABLET | Freq: Every day | ORAL | Status: DC
  Administered 2020-11-08 – 2020-11-21 (×13): 500 ug via ORAL
  Filled 2020-11-07 (×13): qty 1

## 2020-11-07 MED ORDER — FERROUS SULFATE 325 (65 FE) MG PO TABS
325.0000 mg | ORAL_TABLET | Freq: Two times a day (BID) | ORAL | Status: DC
  Administered 2020-11-08 – 2020-11-21 (×26): 325 mg via ORAL
  Filled 2020-11-07 (×25): qty 1

## 2020-11-07 MED ORDER — VANCOMYCIN HCL 1000 MG IV SOLR
INTRAVENOUS | Status: DC | PRN
  Administered 2020-11-07: 1000 mg via INTRAVENOUS

## 2020-11-07 MED ORDER — DEXTROSE 50 % IV SOLN: 0.0000 mL | INTRAVENOUS | Status: DC | PRN

## 2020-11-07 MED ORDER — INSULIN ASPART 100 UNIT/ML ~~LOC~~ SOLN: 15.0000 [IU] | Freq: Once | SUBCUTANEOUS | Status: AC

## 2020-11-07 MED ORDER — SUCCINYLCHOLINE CHLORIDE 20 MG/ML IJ SOLN
INTRAMUSCULAR | Status: DC | PRN
  Administered 2020-11-07: 200 mg via INTRAVENOUS

## 2020-11-07 MED ORDER — SODIUM CHLORIDE 0.9 % IV SOLN: INTRAVENOUS | Status: DC

## 2020-11-07 MED ORDER — INSULIN REGULAR(HUMAN) IN NACL 100-0.9 UT/100ML-% IV SOLN
INTRAVENOUS | Status: DC
  Administered 2020-11-07: 15 [IU]/h via INTRAVENOUS
  Administered 2020-11-08: 5 [IU]/h via INTRAVENOUS
  Filled 2020-11-07 (×2): qty 100

## 2020-11-07 MED ORDER — CLINDAMYCIN PHOSPHATE 600 MG/50ML IV SOLN
600.0000 mg | Freq: Three times a day (TID) | INTRAVENOUS | Status: DC
  Administered 2020-11-08: 600 mg via INTRAVENOUS
  Filled 2020-11-07 (×3): qty 50

## 2020-11-07 MED ORDER — SUGAMMADEX SODIUM 200 MG/2ML IV SOLN
INTRAVENOUS | Status: DC | PRN
  Administered 2020-11-07: 210 mg via INTRAVENOUS

## 2020-11-07 MED ORDER — ONDANSETRON HCL 4 MG/2ML IJ SOLN
INTRAMUSCULAR | Status: DC | PRN
  Administered 2020-11-07: 4 mg via INTRAVENOUS

## 2020-11-07 MED ORDER — PHENYLEPHRINE HCL (PRESSORS) 10 MG/ML IV SOLN
INTRAVENOUS | Status: DC | PRN
  Administered 2020-11-07: 100 ug via INTRAVENOUS

## 2020-11-07 MED ORDER — INSULIN ASPART 100 UNIT/ML ~~LOC~~ SOLN
SUBCUTANEOUS | Status: AC
  Administered 2020-11-07: 15 [IU] via SUBCUTANEOUS
  Filled 2020-11-07: qty 1

## 2020-11-07 MED ORDER — DEXTROSE IN LACTATED RINGERS 5 % IV SOLN: INTRAVENOUS | Status: DC

## 2020-11-07 MED ORDER — ATORVASTATIN CALCIUM 20 MG PO TABS
40.0000 mg | ORAL_TABLET | Freq: Every day | ORAL | Status: DC
  Administered 2020-11-08 – 2020-11-21 (×13): 40 mg via ORAL
  Filled 2020-11-07 (×13): qty 2

## 2020-11-07 MED ORDER — MORPHINE SULFATE (PF) 4 MG/ML IV SOLN
4.0000 mg | INTRAVENOUS | Status: DC | PRN
  Administered 2020-11-07 – 2020-11-08 (×3): 4 mg via INTRAVENOUS
  Filled 2020-11-07 (×3): qty 1

## 2020-11-07 MED ORDER — MORPHINE SULFATE (PF) 4 MG/ML IV SOLN
4.0000 mg | Freq: Once | INTRAVENOUS | Status: AC
  Administered 2020-11-07: 4 mg via INTRAVENOUS
  Filled 2020-11-07: qty 1

## 2020-11-07 MED ORDER — INSULIN ASPART 100 UNIT/ML ~~LOC~~ SOLN
SUBCUTANEOUS | Status: AC
  Filled 2020-11-07: qty 1

## 2020-11-07 MED ORDER — FAMOTIDINE 20 MG PO TABS
20.0000 mg | ORAL_TABLET | Freq: Two times a day (BID) | ORAL | Status: DC
Start: 2020-11-07 — End: 2020-11-22
  Administered 2020-11-08 – 2020-11-21 (×26): 20 mg via ORAL
  Filled 2020-11-07 (×26): qty 1

## 2020-11-07 MED ORDER — DICYCLOMINE HCL 10 MG PO CAPS
10.0000 mg | ORAL_CAPSULE | Freq: Three times a day (TID) | ORAL | Status: DC
  Administered 2020-11-08 – 2020-11-21 (×40): 10 mg via ORAL
  Filled 2020-11-07 (×58): qty 1

## 2020-11-07 MED ORDER — METOCLOPRAMIDE HCL 10 MG PO TABS
10.0000 mg | ORAL_TABLET | Freq: Three times a day (TID) | ORAL | Status: DC
  Administered 2020-11-08 – 2020-11-21 (×37): 10 mg via ORAL
  Filled 2020-11-07 (×38): qty 1

## 2020-11-07 MED ORDER — LACTATED RINGERS IV SOLN: INTRAVENOUS | Status: DC

## 2020-11-07 MED ORDER — MORPHINE SULFATE (PF) 2 MG/ML IV SOLN
2.0000 mg | INTRAVENOUS | Status: DC | PRN
  Administered 2020-11-08 – 2020-11-18 (×34): 2 mg via INTRAVENOUS
  Filled 2020-11-07 (×36): qty 1

## 2020-11-07 MED ORDER — ENOXAPARIN SODIUM 60 MG/0.6ML ~~LOC~~ SOLN
50.0000 mg | Freq: Every day | SUBCUTANEOUS | Status: DC
  Administered 2020-11-09 – 2020-11-20 (×12): 50 mg via SUBCUTANEOUS
  Filled 2020-11-07 (×15): qty 0.6

## 2020-11-07 MED ORDER — PIPERACILLIN-TAZOBACTAM 3.375 G IVPB 30 MIN: 3.3750 g | Freq: Once | INTRAVENOUS | Status: DC

## 2020-11-07 MED ORDER — VANCOMYCIN HCL IN DEXTROSE 1-5 GM/200ML-% IV SOLN: 1000.0000 mg | Freq: Once | INTRAVENOUS | Status: DC

## 2020-11-07 MED ORDER — LACTATED RINGERS IV SOLN: INTRAVENOUS | Status: AC

## 2020-11-07 MED ORDER — ACETAMINOPHEN 650 MG RE SUPP
650.0000 mg | Freq: Four times a day (QID) | RECTAL | Status: DC | PRN
  Filled 2020-11-07: qty 1

## 2020-11-07 MED ORDER — MORPHINE SULFATE (PF) 4 MG/ML IV SOLN: 4.0000 mg | Freq: Once | INTRAVENOUS | Status: DC

## 2020-11-07 MED ORDER — LIDOCAINE HCL (PF) 2 % IJ SOLN
INTRAMUSCULAR | Status: AC
  Filled 2020-11-07: qty 5

## 2020-11-07 MED ORDER — FENTANYL CITRATE (PF) 100 MCG/2ML IJ SOLN
INTRAMUSCULAR | Status: AC
  Filled 2020-11-07: qty 2

## 2020-11-07 MED ORDER — OXYCODONE-ACETAMINOPHEN 5-325 MG PO TABS: 1.0000 | ORAL_TABLET | ORAL | Status: DC | PRN

## 2020-11-07 MED ORDER — TRAZODONE HCL 50 MG PO TABS
25.0000 mg | ORAL_TABLET | Freq: Every evening | ORAL | Status: DC | PRN
  Administered 2020-11-12 – 2020-11-13 (×2): 25 mg via ORAL
  Filled 2020-11-07 (×2): qty 1

## 2020-11-07 MED ORDER — POTASSIUM CHLORIDE 10 MEQ/100ML IV SOLN
10.0000 meq | INTRAVENOUS | Status: AC
  Administered 2020-11-07: 10 meq via INTRAVENOUS
  Filled 2020-11-07: qty 100

## 2020-11-07 MED ORDER — PIPERACILLIN-TAZOBACTAM 3.375 G IVPB
3.3750 g | Freq: Three times a day (TID) | INTRAVENOUS | Status: DC
  Administered 2020-11-08: 3.375 g via INTRAVENOUS
  Filled 2020-11-07: qty 50

## 2020-11-07 MED ORDER — PROPOFOL 10 MG/ML IV BOLUS
INTRAVENOUS | Status: DC | PRN
  Administered 2020-11-07: 200 mg via INTRAVENOUS

## 2020-11-07 MED ORDER — SODIUM CHLORIDE 0.9 % IV SOLN: INTRAVENOUS | Status: DC | PRN

## 2020-11-07 MED ORDER — ADULT MULTIVITAMIN W/MINERALS CH
1.0000 | ORAL_TABLET | Freq: Every day | ORAL | Status: DC
  Administered 2020-11-08 – 2020-11-21 (×13): 1 via ORAL
  Filled 2020-11-07 (×14): qty 1

## 2020-11-07 MED ORDER — SUGAMMADEX SODIUM 500 MG/5ML IV SOLN
INTRAVENOUS | Status: AC
  Filled 2020-11-07: qty 5

## 2020-11-07 MED ORDER — LACTATED RINGERS IV BOLUS: 20.0000 mL/kg | Freq: Once | INTRAVENOUS | Status: DC

## 2020-11-07 MED ORDER — MIDAZOLAM HCL 2 MG/2ML IJ SOLN
INTRAMUSCULAR | Status: AC
  Filled 2020-11-07: qty 2

## 2020-11-07 MED ORDER — PROPOFOL 10 MG/ML IV BOLUS
INTRAVENOUS | Status: AC
  Filled 2020-11-07: qty 20

## 2020-11-07 MED ORDER — ONDANSETRON HCL 4 MG/2ML IJ SOLN
4.0000 mg | Freq: Once | INTRAMUSCULAR | Status: AC
  Administered 2020-11-07: 4 mg via INTRAVENOUS
  Filled 2020-11-07: qty 2

## 2020-11-07 MED ORDER — LIDOCAINE HCL (CARDIAC) PF 100 MG/5ML IV SOSY
PREFILLED_SYRINGE | INTRAVENOUS | Status: DC | PRN
  Administered 2020-11-07: 100 mg via INTRAVENOUS

## 2020-11-07 MED ORDER — GABAPENTIN 300 MG PO CAPS
600.0000 mg | ORAL_CAPSULE | Freq: Every day | ORAL | Status: DC
Start: 2020-11-07 — End: 2020-11-22
  Administered 2020-11-08 – 2020-11-20 (×13): 600 mg via ORAL
  Filled 2020-11-07 (×13): qty 2

## 2020-11-07 MED ORDER — SENNA 8.6 MG PO TABS
1.0000 | ORAL_TABLET | Freq: Two times a day (BID) | ORAL | Status: DC | PRN
  Administered 2020-11-10: 8.6 mg via ORAL
  Filled 2020-11-07: qty 1

## 2020-11-07 MED ORDER — FENTANYL CITRATE (PF) 100 MCG/2ML IJ SOLN: 25.0000 ug | INTRAMUSCULAR | Status: DC | PRN

## 2020-11-07 MED ORDER — LINACLOTIDE 72 MCG PO CAPS
72.0000 ug | ORAL_CAPSULE | Freq: Every day | ORAL | Status: DC | PRN
  Filled 2020-11-07: qty 1

## 2020-11-07 MED ORDER — SODIUM CHLORIDE 0.9 % IV SOLN
2.0000 g | INTRAVENOUS | Status: AC
  Administered 2020-11-07: 2 g via INTRAVENOUS
  Filled 2020-11-07: qty 2

## 2020-11-07 MED ORDER — OXYCODONE HCL 5 MG/5ML PO SOLN: 5.0000 mg | Freq: Once | ORAL | Status: DC | PRN

## 2020-11-07 MED ORDER — VANCOMYCIN HCL 1250 MG/250ML IV SOLN
1250.0000 mg | INTRAVENOUS | Status: DC
  Filled 2020-11-07: qty 250

## 2020-11-07 MED ORDER — VANCOMYCIN HCL IN DEXTROSE 1-5 GM/200ML-% IV SOLN
1000.0000 mg | INTRAVENOUS | Status: DC
  Filled 2020-11-07: qty 200

## 2020-11-07 MED ORDER — INSULIN ASPART 100 UNIT/ML ~~LOC~~ SOLN: 10.0000 [IU] | Freq: Once | SUBCUTANEOUS | Status: DC

## 2020-11-07 MED ORDER — CHLORHEXIDINE GLUCONATE CLOTH 2 % EX PADS
6.0000 | MEDICATED_PAD | Freq: Every day | CUTANEOUS | Status: DC
  Administered 2020-11-07 – 2020-11-18 (×9): 6 via TOPICAL

## 2020-11-07 SURGICAL SUPPLY — 58 items
BLADE OSC/SAGITTAL MD 9X18.5 (BLADE) ×2 IMPLANT
BLADE OSCILLATING/SAGITTAL (BLADE)
BLADE SURG 15 STRL LF DISP TIS (BLADE) ×2 IMPLANT
BLADE SURG 15 STRL SS (BLADE) ×3
BLADE SURG MINI STRL (BLADE) IMPLANT
BLADE SW THK.38XMED LNG THN (BLADE) IMPLANT
BNDG CMPR STD VLCR NS LF 5.8X4 (GAUZE/BANDAGES/DRESSINGS)
BNDG CONFORM 2 STRL LF (GAUZE/BANDAGES/DRESSINGS) ×2 IMPLANT
BNDG ELASTIC 4X5.8 VLCR NS LF (GAUZE/BANDAGES/DRESSINGS) ×1 IMPLANT
BNDG ESMARK 4X12 TAN STRL LF (GAUZE/BANDAGES/DRESSINGS) ×3 IMPLANT
BNDG GAUZE 4.5X4.1 6PLY STRL (MISCELLANEOUS) ×5 IMPLANT
CANISTER SUCT 1200ML W/VALVE (MISCELLANEOUS) ×3 IMPLANT
COVER WAND RF STERILE (DRAPES) ×1 IMPLANT
CUFF TOURN SGL QUICK 12 (TOURNIQUET CUFF) IMPLANT
CUFF TOURN SGL QUICK 18X4 (TOURNIQUET CUFF) ×2 IMPLANT
DRAPE FLUOR MINI C-ARM 54X84 (DRAPES) IMPLANT
DURAPREP 26ML APPLICATOR (WOUND CARE) ×3 IMPLANT
ELECT REM PT RETURN 9FT ADLT (ELECTROSURGICAL) ×3
ELECTRODE REM PT RTRN 9FT ADLT (ELECTROSURGICAL) ×2 IMPLANT
GAUZE PACKING IODOFORM 1/2 (PACKING) ×1 IMPLANT
GAUZE SPONGE 4X4 12PLY STRL (GAUZE/BANDAGES/DRESSINGS) ×7 IMPLANT
GAUZE XEROFORM 1X8 LF (GAUZE/BANDAGES/DRESSINGS) ×3 IMPLANT
GLOVE BIO SURGEON STRL SZ7.5 (GLOVE) ×4 IMPLANT
GLOVE INDICATOR 8.0 STRL GRN (GLOVE) ×7 IMPLANT
GOWN STRL REUS W/ TWL LRG LVL3 (GOWN DISPOSABLE) ×4 IMPLANT
GOWN STRL REUS W/TWL LRG LVL3 (GOWN DISPOSABLE) ×6
HANDPIECE VERSAJET DEBRIDEMENT (MISCELLANEOUS) ×3 IMPLANT
KIT TURNOVER KIT A (KITS) ×3 IMPLANT
LABEL OR SOLS (LABEL) ×3 IMPLANT
MANIFOLD NEPTUNE II (INSTRUMENTS) ×4 IMPLANT
NDL FILTER BLUNT 18X1 1/2 (NEEDLE) ×2 IMPLANT
NEEDLE FILTER BLUNT 18X 1/2SAF (NEEDLE) ×1
NEEDLE FILTER BLUNT 18X1 1/2 (NEEDLE) ×2 IMPLANT
NEEDLE HYPO 25X1 1.5 SAFETY (NEEDLE) ×6 IMPLANT
NS IRRIG 500ML POUR BTL (IV SOLUTION) ×3 IMPLANT
PACK EXTREMITY ARMC (MISCELLANEOUS) ×3 IMPLANT
PAD ABD DERMACEA PRESS 5X9 (GAUZE/BANDAGES/DRESSINGS) ×10 IMPLANT
PENCIL ELECTRO HAND CTR (MISCELLANEOUS) ×3 IMPLANT
PULSAVAC PLUS IRRIG FAN TIP (DISPOSABLE) ×3
RASP SM TEAR CROSS CUT (RASP) IMPLANT
SOL .9 NS 3000ML IRR  AL (IV SOLUTION) ×6
SOL .9 NS 3000ML IRR AL (IV SOLUTION) ×4
SOL .9 NS 3000ML IRR UROMATIC (IV SOLUTION) IMPLANT
SOL PREP PVP 2OZ (MISCELLANEOUS) ×3
SOLUTION PREP PVP 2OZ (MISCELLANEOUS) ×2 IMPLANT
SPONGE LAP 18X18 RF (DISPOSABLE) ×1 IMPLANT
STOCKINETTE STRL 6IN 960660 (GAUZE/BANDAGES/DRESSINGS) ×3 IMPLANT
SUT ETHILON 3-0 FS-10 30 BLK (SUTURE) ×9
SUT ETHILON 4-0 (SUTURE)
SUT ETHILON 4-0 FS2 18XMFL BLK (SUTURE)
SUT VIC AB 3-0 SH 27 (SUTURE)
SUT VIC AB 3-0 SH 27X BRD (SUTURE) ×2 IMPLANT
SUT VIC AB 4-0 FS2 27 (SUTURE) ×1 IMPLANT
SUTURE EHLN 3-0 FS-10 30 BLK (SUTURE) ×2 IMPLANT
SUTURE ETHLN 4-0 FS2 18XMF BLK (SUTURE) ×2 IMPLANT
SYR 10ML LL (SYRINGE) ×6 IMPLANT
SYR 3ML LL SCALE MARK (SYRINGE) ×3 IMPLANT
TIP FAN IRRIG PULSAVAC PLUS (DISPOSABLE) IMPLANT

## 2020-11-07 NOTE — ED Provider Notes (Signed)
[facility] Emergency Department Provider Note   ____________________________________________   Event Date/Time   First MD Initiated Contact with Patient 11/07/20 1752     (approximate)  I have reviewed the triage vital signs and the nursing notes.   HISTORY  Chief Complaint Left foot infection   HPI Layten Aiken is a 44 y.o. male history of diabetes previous right toe amputation  Patient presents today reports his blood sugars have been out of control but he has a worsening infection in his left foot.  Has been on doxycycline seeing Dr. Vickki Muff for this and they told him he need to be hospitalized.  Ports significant pain from his left foot shoots all the way up his left leg towards his left lower back.  He had fevers chills fatigue.  No vomiting but some nausea  Reports his blood sugars are out of control he does have an insulin pump, but when he has infections his blood sugars will shoot up extremely high and difficult to control  No headache.  No known Covid exposure  Still has feeling somewhat in the left foot but severe pain particularly in the left foot and left middle toe which is now black   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    Patient Active Problem List   Diagnosis Date Noted   Necrotizing fasciitis of ankle and foot ([place]) 11/07/2020   Pain management contract signed 09/07/2019   Evaluation by psychiatric service required 08/18/2019   History of depression 08/18/2019   Primary osteoarthritis of both shoulders 07/16/2019   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) 07/16/2019   Chronic pain of both shoulders 07/16/2019   Cervical radicular pain 07/16/2019   S/P cervical spinal fusion 07/16/2019   Cervical spondylosis 07/16/2019   Cervical facet joint  syndrome 07/16/2019   Chronic pain syndrome 07/16/2019   Anemia 05/25/2019   Osteomyelitis of right foot ([place]) 05/25/2019   Diabetic foot infection ([place]) 03/05/2019   Abscess 11/25/2018   Diabetic peripheral neuropathy associated with type 2 diabetes mellitus ([place]) 06/16/2018   Hyperlipidemia associated with type 2 diabetes mellitus ([place]) 06/16/2018   Intractable nausea and vomiting 10/21/2017   HNP (herniated nucleus pulposus), cervical 09/09/2017   Erectile dysfunction 07/31/2017   Gastroesophageal reflux disease 07/31/2017   Rotator cuff syndrome of left shoulder 07/31/2017   Cellulitis of right leg 07/30/2016   Tobacco abuse disorder 07/30/2016   Routine general medical examination at a health care facility 01/20/2014   Diabetic neuropathy, type I diabetes mellitus ([place]) 01/20/2014   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   HYPERLIPIDEMIA 03/17/2009   DM type 2 with diabetic peripheral neuropathy ([place]) 03/02/2009   DM 12/08/2008   OBSTRUCTIVE SLEEP APNEA 12/08/2008   Essential hypertension 12/08/2008   Allergic rhinitis 12/15/2007    Past Surgical History:  Procedure Laterality Date   ANTERIOR CERVICAL DECOMP/DISCECTOMY FUSION N/A 09/09/2017   Procedure: ANTERIOR CERVICAL DECOMPRESSION/DISCECTOMY FUSION CERVICAL 6- CERVICAL 7;  Surgeon: Ashok Pall, MD;  Location: [place];  Service: Neurosurgery;  Laterality: N/A;  ANTERIOR CERVICAL DECOMPRESSION/DISCECTOMY FUSION CERVICAL 6- CERVICAL 7   APPENDECTOMY     I & D EXTREMITY Right 10/03/2017   Procedure: IRRIGATION AND DEBRIDEMENT RIGHT WRIST;  Surgeon: Leanora Cover, MD;  Location: [place];  Service: Orthopedics;  Laterality: Right;   I & D EXTREMITY Right 11/26/2018   Procedure:  IRRIGATION AND DEBRIDEMENT FASCIA ON RIGHT FOOT;  Surgeon: Samara Deist, DPM;  Location: [facility];  Service: Podiatry;  Laterality: Right;   INCISION AND DRAINAGE Right 03/06/2019   Procedure: INCISION AND DRAINAGE RIGHT FOOT, WITH 4th RAY  AMPUTATION;  Surgeon: Samara Deist, DPM;  Location: [facility];  Service: Podiatry;  Laterality: Right;   METATARSAL HEAD EXCISION Right 05/15/2019   Procedure: OSTECTOMY;MET HEAD 5;  Surgeon: Samara Deist, DPM;  Location: [facility];  Service: Podiatry;  Laterality: Right;   osteomylitis     ROTATOR CUFF REPAIR Left     Prior to Admission medications   Medication Sig Start Date End Date Taking? Authorizing Provider  atorvastatin (LIPITOR) 40 MG tablet Take 40 mg by mouth daily.  08/23/17   [provider]  busPIRone (BUSPAR) 15 MG tablet Take 15 mg by mouth 2 (two) times daily.    [provider]  [employer] SENNA 8.6 MG tablet Take 1 tablet by mouth 2 (two) times daily as needed for constipation.  10/31/17   [provider]  dicyclomine (BENTYL) 10 MG capsule Take 10 mg by mouth 2 (two) times daily as needed. 04/27/20   [provider]  dimenhyDRINATE (DRAMAMINE) 50 MG tablet Take 50 mg by mouth 2 (two) times daily as needed for itching or dizziness.     [provider]  famotidine (PEPCID) 20 MG tablet Take 20 mg by mouth 2 (two) times daily.     [provider]  ferrous sulfate 325 (65 FE) MG tablet Take 325 mg by mouth 2 (two) times daily with a meal.    [provider]  gabapentin (NEURONTIN) 300 MG capsule Take 600 mg by mouth at bedtime.     [provider]  insulin aspart (NOVOLOG) 100 UNIT/ML injection Inject into the skin. INJECT UP TO 200 UNITS VIA INSULIN PUMP ONCE DAILY    [provider]  Insulin Human (INSULIN PUMP) SOLN Inject 150 each into the skin daily. Inject 150 units subcutaneously via pump daily    [provider]  linaclotide (LINZESS) 72 MCG capsule Take 72 mcg by mouth daily as needed (constipation).    [provider]  lisinopril (PRINIVIL,ZESTRIL) 5 MG tablet Take 5 mg by mouth daily. 10/14/17   [provider]  metoCLOPramide (REGLAN) 10 MG tablet Take 10 mg by mouth  3 (three) times daily before meals.  05/25/19   [provider]  Multiple Vitamin (MULTIVITAMIN WITH MINERALS) TABS tablet Take 1 tablet by mouth daily.    [provider]  ondansetron (ZOFRAN) 8 MG tablet Take 8 mg by mouth every 6 (six) hours as needed for nausea/vomiting.    [provider]  pantoprazole (PROTONIX) 40 MG tablet Take 40 mg by mouth daily. 08/23/17   [provider]  pioglitazone (ACTOS) 30 MG tablet Take 30 mg by mouth daily.  06/29/19   [provider]  sertraline (ZOLOFT) 100 MG tablet Take 100 mg by mouth daily.  10/31/17   [provider]  sildenafil (REVATIO) 20 MG tablet Take 100 mg by mouth. 5 tabs as needed prior to sexual activity    [provider]  vitamin B-12 (CYANOCOBALAMIN) 1000 MCG tablet Take 1,000 mcg by mouth in the morning and at bedtime.  01/27/19   [provider]    Allergies Patient has no known allergies.  Family History  Problem Relation Age of Onset   Diabetes Mother    Heart disease Father    Diabetes  Father    Arthritis Other    Hyperlipidemia Other    Hypertension Other    Cancer Other        breast   Mental illness Neg Hx     Social History Social History   Tobacco Use   Smoking status: Former Smoker    Packs/day: 1.00    Years: 17.00    Pack years: 17.00    Types: Cigarettes    Quit date: 01/14/2019    Years since quitting: 1.8   Smokeless tobacco: Never Used  Vaping Use   Vaping Use: Never used  Substance Use Topics   Alcohol use: Yes    Comment: rare   Drug use: No    Review of Systems Constitutional: Fevers and chills Eyes: No visual changes. ENT: No sore throat. Cardiovascular: Denies chest pain. Respiratory: Denies shortness of breath. Gastrointestinal: No abdominal pain.   Genitourinary: Negative for dysuria. Musculoskeletal: See HPI Skin: Negative for rash except swelling pain and discomfort left foot. Neurological: Negative  for headaches, areas of focal weakness or numbness.    ____________________________________________   PHYSICAL EXAM:  VITAL SIGNS: ED Triage Vitals  Enc Vitals Group     BP 11/07/20 1751 129/81     Pulse Rate 11/07/20 1751 (!) 104     Resp 11/07/20 1751 20     Temp 11/07/20 1751 (!) 101.2 F (38.4 C)     Temp Source 11/07/20 1751 Oral     SpO2 11/07/20 1751 97 %     Weight 11/07/20 1752 225 lb (102.1 kg)     Height 11/07/20 1752 5' 10"  (1.778 m)     Head Circumference --      Peak Flow --      Pain Score 11/07/20 1752 10     Pain Loc --      Pain Edu? --      Excl. in [place]? --     Constitutional: Alert and oriented.  Moderately ill-appearing appears in pain reports severe left foot pain Eyes: Conjunctivae are normal. Head: Atraumatic. Nose: No congestion/rhinnorhea. Mouth/Throat: Mucous membranes are moist. Neck: No stridor.  Cardiovascular: Normal rate, regular rhythm. Grossly normal heart sounds.  Good peripheral circulation. Respiratory: Normal respiratory effort.  No retractions. Lungs CTAB. Gastrointestinal: Soft and nontender. No distention. Musculoskeletal: Left foot moderately edematous, the left middle phalanx has distal necrosis.  There is also purulent drainage as well as extension towards the plantar surface.  Is very tender to touch and some edema tracks up towards the ankle.  No obvious crepitance.  Foul odor detected Neurologic:  Normal speech and language. No gross focal neurologic deficits are appreciated.  Skin:  Skin is warm, dry and intact. No rash noted. Psychiatric: Mood and affect are normal. Speech and behavior are normal.  ____________________________________________   LABS (all labs ordered are listed, but only abnormal results are displayed)  Labs Reviewed  COMPREHENSIVE METABOLIC PANEL - Abnormal; Notable for the following components:      Result Value   Sodium 121 (*)    Potassium 3.4 (*)    Chloride 82 (*)    Glucose, Bld 619 (*)     Creatinine, Ser 1.63 (*)    Total Protein 8.5 (*)    Albumin 2.7 (*)    Alkaline Phosphatase 134 (*)    GFR, Estimated 53 (*)    All other components within normal limits  CBC WITH DIFFERENTIAL/PLATELET - Abnormal; Notable for the following components:   WBC 14.5 (*)  RBC 3.78 (*)    Hemoglobin 10.1 (*)    HCT 31.6 (*)    Neutro Abs 12.9 (*)    Abs Immature Granulocytes 0.15 (*)    All other components within normal limits  RESP PANEL BY RT-PCR (FLU A&B, COVID) ARPGX2  CULTURE, BLOOD (ROUTINE X 2)  CULTURE, BLOOD (ROUTINE X 2)  URINE CULTURE  LACTIC ACID, PLASMA  PROTIME-INR  APTT  LACTIC ACID, PLASMA  URINALYSIS, COMPLETE (UACMP) WITH MICROSCOPIC   ____________________________________________  EKG   ____________________________________________  RADIOLOGY  DG Foot 2 Views Left  Result Date: 11/07/2020 CLINICAL DATA:  44 year old male with left foot infection. EXAM: LEFT FOOT - 2 VIEW COMPARISON:  None. FINDINGS: There is no acute fracture or dislocation. There is mild osteopenia and mild hallux valgus. No periosteal elevation or bone erosion to suggest osteomyelitis. There is diffuse subcutaneous gas along the plantar foot extending into the third toe. There is diffuse soft tissue swelling and subcutaneous edema. IMPRESSION: 1. No acute fracture or dislocation. 2. Diffuse subcutaneous gas along the plantar foot extending into the third toe. Findings concerning for necrotizing fasciitis. Electronically Signed   By: Anner Crete M.D.   On: 11/07/2020 18:25    Imaging reviewed concerning for possible necrotizing process ____________________________________________   PROCEDURES  Procedure(s) performed: None  Procedures  Critical Care performed: Yes, see critical care note(s)  CRITICAL CARE Performed by: Delman Kitten   Total critical care time: 35 minutes  Critical care time was exclusive of separately billable procedures and treating other  patients.  Critical care was necessary to treat or prevent imminent or life-threatening deterioration.  Critical care was time spent personally by me on the following activities: development of treatment plan with patient and/or surrogate as well as nursing, discussions with consultants, evaluation of patient's response to treatment, examination of patient, obtaining history from patient or surrogate, ordering and performing treatments and interventions, ordering and review of laboratory studies, ordering and review of radiographic studies, pulse oximetry and re-evaluation of patient's condition.  ____________________________________________   INITIAL IMPRESSION / ASSESSMENT AND PLAN / ED COURSE  Pertinent labs & imaging results that were available during my care of the patient were reviewed by me and considered in my medical decision making (see chart for details).   Obvious concern for left foot infection.  Does have palpable dorsalis pedis pulse but obvious necrosis of the left middle phalanx and concern with edema for a sending infection.  Imaging reviewed is concerning for necrotizing fasciitis, podiatry emergently consulted and seen and evaluated the patient at bedside in the ER.  Appreciate consultation from Dr. Caryl Comes.  Broad-spectrum antibiotics  Clinical Course as of 11/07/20 1903  Mon Nov 07, 2020  [id number] Discussed with podiatry Dr. Caryl Comes concerning left foot imaging.  He will be seeing the, patient in consult this evening and when working to see if the OR may be available tonight.  Notified him of concern for possible necrotizing fasciitis [MQ]    Clinical Course User Index [MQ] Delman Kitten, MD   ----------------------------------------- 7:04 PM on 11/07/2020 -----------------------------------------  Patient with OR team, prepping for OR.  Pain improving after second dose of morphine.  Patient resting comfortably.  Family at  bedside  ____________________________________________   FINAL CLINICAL IMPRESSION(S) / ED DIAGNOSES  Final diagnoses:  Hyperglycemia  Sepsis, due to unspecified organism, unspecified whether acute organ dysfunction present ([place])  Necrotizing fasciitis ([place])        Note:  This document was prepared using Dragon voice recognition  software and may include unintentional dictation errors       Delman Kitten, MD 11/07/20 1905

## 2020-11-07 NOTE — Progress Notes (Signed)
CODE SEPSIS - PHARMACY COMMUNICATION  **Broad Spectrum Antibiotics should be administered within 1 hour of Sepsis diagnosis**  Time Code Sepsis Called/Page Received: 12/20 1754  Antibiotics Ordered: cefepime and Vanc  Time of 1st antibiotic administration: 1833  Additional action taken by pharmacy:    If necessary, Name of Provider/Nurse Contacted:      Angelique Blonder ,PharmD Clinical Pharmacist  11/07/2020  6:43 PM

## 2020-11-07 NOTE — Anesthesia Preprocedure Evaluation (Addendum)
Anesthesia Evaluation  Patient identified by MRN, date of birth, ID band Patient awake    Reviewed: Allergy & Precautions, H&P , NPO status , Patient's Chart, lab work & pertinent test results  History of Anesthesia Complications Negative for: history of anesthetic complications  Airway Mallampati: III  TM Distance: >3 FB Neck ROM: full    Dental  (+) Missing   Pulmonary neg shortness of breath, sleep apnea , former smoker,    Pulmonary exam normal        Cardiovascular Exercise Tolerance: Good hypertension, (-) angina(-) Past MI and (-) DOE Normal cardiovascular exam     Neuro/Psych  Neuromuscular disease negative psych ROS   GI/Hepatic Neg liver ROS, GERD  Medicated and Controlled,  Endo/Other  diabetes, Type 2  Renal/GU      Musculoskeletal   Abdominal   Peds  Hematology negative hematology ROS (+)   Anesthesia Other Findings Gas gangrene   Past Medical History: 03/17/2009: DIABETES MELLITUS, TYPE II, UNCONTROLLED 12/08/2008: DM 03/17/2009: HYPERLIPIDEMIA 12/08/2008: HYPERTENSION 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  Past Surgical History: 09/09/2017: ANTERIOR CERVICAL DECOMP/DISCECTOMY FUSION; N/A     Comment:  Procedure: ANTERIOR CERVICAL DECOMPRESSION/DISCECTOMY               FUSION CERVICAL 6- CERVICAL 7;  Surgeon: Cabbell, Kyle,               MD;  Location: [facility];  Service: Neurosurgery;                Laterality: N/A;  ANTERIOR CERVICAL               DECOMPRESSION/DISCECTOMY FUSION CERVICAL 6- CERVICAL 7 No date: APPENDECTOMY 10/03/2017: I & D EXTREMITY; Right     Comment:  Procedure: IRRIGATION AND DEBRIDEMENT RIGHT WRIST;                Surgeon: Kuzma, Kevin, MD;  Location: [facility];  Service:               Orthopedics;  Laterality: Right; 11/26/2018: I & D EXTREMITY; Right     Comment:  Procedure: IRRIGATION AND DEBRIDEMENT FASCIA ON RIGHT               FOOT;  Surgeon: Fowler, Justin, DPM;  Location: [facility];              Service: Podiatry;  Laterality: Right; 03/06/2019: INCISION AND DRAINAGE; Right     Comment:  Procedure: INCISION AND DRAINAGE RIGHT FOOT, WITH 4th               RAY AMPUTATION;  Surgeon: Fowler, Justin, DPM;  Location:              [facility];  Service: Podiatry;  Laterality: Right; 05/15/2019: METATARSAL HEAD EXCISION; Right     Comment:  Procedure: OSTECTOMY;MET HEAD 5;  Surgeon: Fowler,               Justin, DPM;  Location: [facility];  Service: Podiatry;                Laterality: Right; No date: osteomylitis No date: ROTATOR CUFF REPAIR; Left  BMI    Body Mass Index: 32.28 kg/m      Reproductive/Obstetrics negative OB ROS                            Anesthesia Physical Anesthesia Plan  ASA: IV and emergent  Anesthesia Plan: General ETT     Post-op Pain Management:    Induction: Intravenous  PONV Risk Score and Plan: Ondansetron, Dexamethasone, Midazolam and Treatment may vary due to age or medical condition  Airway Management Planned: Oral ETT  Additional Equipment:   Intra-op Plan:   Post-operative Plan: Extubation in OR  Informed Consent: I have reviewed the patients History and Physical, chart, labs and discussed the procedure including the risks, benefits and alternatives for the proposed anesthesia with the patient or authorized representative who has indicated his/her understanding and acceptance.     Dental Advisory Given  Plan Discussed with: Anesthesiologist, CRNA and Surgeon  Anesthesia Plan Comments: (Patient consented for risks of anesthesia including but not limited to:  - adverse reactions to medications - damage to eyes, teeth, lips or other oral mucosa - nerve damage due to positioning  - sore throat or hoarseness - Damage to heart, brain, nerves, lungs, other parts of body or loss of life  Patient voiced understanding.)        Anesthesia Quick Evaluation  

## 2020-11-07 NOTE — Op Note (Signed)
Date of operation: 11/07/2020.  Surgeon: Ricci Barker D.P.M.  Preoperative diagnosis: Gas gangrene left 3rd toe and foot.  Postoperative diagnosis: Same.  Procedure: 1.  Amputation left 3rd toe with partial ray resection. 2.  I&D multiple sites left foot subfascial plane.  Anesthesia: LMA.  Hemostasis: Pneumatic tourniquet left ankle 250 mmHg.  Estimated blood loss: 25 cc.  Pathology: Left 3rd toe and metatarsal.  Cultures: Bone culture proximal phalanx 3rd toe.  Soft tissue deep abscess left plantar foot.  Drains: Half-inch iodoform gauze.  Complications: None apparent.  Operative indications: This is a 44 year old male with a history of ulceration with early gangrenous changes last week who was recommended to go to the hospital but states he had to wait until this week due to an obligation out of town.  Presented to the emergency department this evening and was identified with gangrenous changes of the 3rd toe with gas throughout the plantar foot and decision was made for emergent I&D of the left foot.  Operative procedure: Patient was taken to the operating room and placed on the table in the supine position.  Following satisfactory LMA anesthesia a pneumatic tourniquet was applied at the level of the left foot and the foot was prepped and draped in usual sterile fashion.  Attention was directed to the distal aspect of the left foot where an incision was made from dorsal to plantar along the 3rd metatarsal shaft going medial and lateral around the base of the toe and then joining plantarly.  The incision was deepened via sharp and blunt dissection down to the level of the bone and dissection carried back proximally.  The toe was noted to be fragmented and fractured and was removed.  The distal aspect of the 3rd metatarsal was then freed from the surrounding tissues and transected using a sagittal saw and removed.  A culture was taken from the bone of the proximal phalanx and these were  passed off for pathology.  Significant purulence was noted into the wound extending plantarly.     Attention was then directed plantarly where there was a large fluctuant abscess along the plantar aspect of the foot extending up towards the proximal medial aspect of the arch.  An incision was made plantarly through the abscessed area and a large amount of purulent drainage was expressed with heavy underlying necrotic tissue down to the level of the fascia.  Infection was noted tracking along the flexor tendon sheath and then this was opened up subfascial using sharp and blunt dissection through the plantar fascial area.  There was noted to be extensive purulence extending proximal and and incision was then made along the medial aspect of the rear foot extending up onto the medial aspect of the foot.  This was deepened down to the level of the tendinous structures and again a significant amount of purulence was noted with some underlying necrotic tissue.  Using rongeurs the necrotic tissue was grossly excised down to the subfascial level and the wound was then thoroughly debrided with a versa jet debrider on a setting of 4.  Abscess was also noted to extend beneath the 1st metatarsal area and this was also debrided with a rongeur and versa jet.  It was noted that the skin along much of the plantar arch was extremely friable and very thin.  The wound was then copiously irrigated with 3 L of pulsed irrigation using saline.  The incisions were then closed using 3-0 nylon simple interrupted sutures for the ray  resection site as well as simple interrupted suture in a horizontal mattress plantarly with several areas left open for drainage.  The open areas were packed with half-inch iodoform gauze.  4 x 4's ABDs multiple Kerlix were then applied to the left lower extremity.  The tourniquet was released and an Ace wrap applied for compression.  Patient was awakened and transported to the PACU with vital signs stable and in  good condition.

## 2020-11-07 NOTE — Transfer of Care (Signed)
Immediate Anesthesia Transfer of Care Note  Patient: Richard Mendoza  Procedure(s) Performed: INCISION AND DRAINAGE (Left Foot) 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 (Left Third Toe)  Patient Location: PACU  Anesthesia Type:General  Level of Consciousness: sedated and patient cooperative  Airway & Oxygen Therapy: Patient Spontanous Breathing and Patient connected to face mask oxygen  Post-op Assessment: Report given to RN and Post -op Vital signs reviewed and stable  Post vital signs: Reviewed and stable  Last Vitals:  Vitals Value Taken Time  BP 134/84 11/07/20 2113  Temp    Pulse 88 11/07/20 2114  Resp 16 11/07/20 2114  SpO2 100 % 11/07/20 2114  Vitals shown include unvalidated device data.  Last Pain:  Vitals:   11/07/20 1853  TempSrc:   PainSc: 9          Complications: No complications documented.

## 2020-11-07 NOTE — H&P (View-Only) (Signed)
Reason for Consult: Gas gangrene left foot. Referring Physician: [name] is an 44 y.o. male.  HPI: This is a 44-year-old diabetic male known to our practice outpatient.  Was seen by Dr. Fowler last week who recommended hospitalization for a infection in his left third toe.  Patient stated that he had some business out of town that he had to attend to and could not go into the hospital.  Presents today for admission.  Patient states he has had fever and chills as well as nausea and vomiting for the last 2 days.  States he has not eaten in 3 days.  Just drinking Gatorade.  Past Medical History:  Diagnosis Date  . DIABETES MELLITUS, TYPE II, UNCONTROLLED 03/17/2009  . DM 12/08/2008  . HYPERLIPIDEMIA 03/17/2009  . HYPERTENSION 12/08/2008  . 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 ANTERIOR CERVICAL DECOMP/DISCECTOMY FUSION N/A 09/09/2017   Procedure: ANTERIOR CERVICAL DECOMPRESSION/DISCECTOMY FUSION CERVICAL 6- CERVICAL 7;  Surgeon: Cabbell, Kyle, MD;  Location: [facility] OR;  Service: Neurosurgery;  Laterality: N/A;  ANTERIOR CERVICAL DECOMPRESSION/DISCECTOMY FUSION CERVICAL 6- CERVICAL 7  . APPENDECTOMY    . I & D EXTREMITY Right 10/03/2017   Procedure: IRRIGATION AND DEBRIDEMENT RIGHT WRIST;  Surgeon: Kuzma, Kevin, MD;  Location: [facility] OR;  Service: Orthopedics;  Laterality: Right;  . I & D EXTREMITY Right 11/26/2018   Procedure: IRRIGATION AND DEBRIDEMENT FASCIA ON RIGHT FOOT;  Surgeon: Fowler, Justin, DPM;  Location: [facility] ORS;  Service: Podiatry;  Laterality: Right;  . INCISION AND DRAINAGE Right 03/06/2019   Procedure: INCISION AND DRAINAGE RIGHT FOOT, WITH 4th RAY AMPUTATION;  Surgeon: Fowler, Justin, DPM;  Location: [facility] ORS;  Service: Podiatry;  Laterality: Right;  . METATARSAL HEAD EXCISION Right 05/15/2019   Procedure: OSTECTOMY;MET HEAD 5;  Surgeon: Fowler, Justin, DPM;  Location: [facility] ORS;  Service: Podiatry;  Laterality: Right;  .  osteomylitis    . ROTATOR CUFF REPAIR Left     Family History  Problem Relation Age of Onset  . Diabetes Mother   . Heart disease Father   . Diabetes Father   . Arthritis Other   . Hyperlipidemia Other   . Hypertension Other   . Cancer Other        breast  . Mental illness Neg Hx     Social History:  reports that he quit smoking about 21 months ago. His smoking use included cigarettes. He has a 17.00 pack-year smoking history. He has never used smokeless tobacco. He reports current alcohol use. He reports that he does not use drugs.  Allergies: No Known Allergies  Medications:  Scheduled: . insulin aspart  10 Units Intravenous Once  .  morphine injection  4 mg Intravenous Once    Results for orders placed or performed during the hospital encounter of 11/07/20 (from the past 48 hour(s))  Lactic acid, plasma     Status: None   Collection Time: 11/07/20  5:58 PM  Result Value Ref Range   Lactic Acid, Venous 1.8 0.5 - 1.9 mmol/L    Comment: Performed at [facility], [address] [place], [place] [address]  Comprehensive metabolic panel     Status: Abnormal   Collection Time: 11/07/20  5:58 PM  Result Value Ref Range   Sodium 121 (L) 135 - 145 mmol/L   Potassium 3.4 (L) 3.5 - 5.1 mmol/L   Chloride 82 (L) 98 - 111 mmol/L   CO2 25 22 -   32 mmol/L   Glucose, Bld 619 (HH) 70 - 99 mg/dL    Comment: CRITICAL RESULT CALLED TO, READ BACK BY AND VERIFIED WITH [name] @1836 ON 11/07/20 SK[facility] Glucose reference range applies only to samples taken after fasting for at least 8 hours.    BUN 16 6 - 20 mg/dL   Creatinine, Ser 1.63 (H) 0.61 - 1.24 mg/dL   Calcium 8.9 8.9 - 10.3 mg/dL   Total Protein 8.5 (H) 6.5 - 8.1 g/dL   Albumin 2.7 (L) 3.5 - 5.0 g/dL   AST 25 15 - 41 U/L   ALT 17 0 - 44 U/L   Alkaline Phosphatase 134 (H) 38 - 126 U/L   Total Bilirubin 1.1 0.3 - 1.2 mg/dL   GFR, Estimated 53 (L) >60 mL/min    Comment: (NOTE) Calculated using the CKD-EPI Creatinine  Equation (2021)    Anion gap 14 5 - 15    Comment: Performed at [facility], [address] [place], [place] [address]  CBC WITH DIFFERENTIAL     Status: Abnormal   Collection Time: 11/07/20  5:58 PM  Result Value Ref Range   WBC 14.5 (H) 4.0 - 10.5 K/uL   RBC 3.78 (L) 4.22 - 5.81 MIL/uL   Hemoglobin 10.1 (L) 13.0 - 17.0 g/dL   HCT 31.6 (L) 39.0 - 52.0 %   MCV 83.6 80.0 - 100.0 fL   MCH 26.7 26.0 - 34.0 pg   MCHC 32.0 30.0 - 36.0 g/dL   RDW 14.4 11.5 - 15.5 %   Platelets 346 150 - 400 K/uL   nRBC 0.0 0.0 - 0.2 %   Neutrophils Relative % 89 %   Neutro Abs 12.9 (H) 1.7 - 7.7 K/uL   Lymphocytes Relative 5 %   Lymphs Abs 0.8 0.7 - 4.0 K/uL   Monocytes Relative 5 %   Monocytes Absolute 0.7 0.1 - 1.0 K/uL   Eosinophils Relative 0 %   Eosinophils Absolute 0.0 0.0 - 0.5 K/uL   Basophils Relative 0 %   Basophils Absolute 0.0 0.0 - 0.1 K/uL   Immature Granulocytes 1 %   Abs Immature Granulocytes 0.15 (H) 0.00 - 0.07 K/uL    Comment: Performed at [facility], [address] [place], [place] [address]  Protime-INR     Status: None   Collection Time: 11/07/20  5:58 PM  Result Value Ref Range   Prothrombin Time 14.8 11.4 - 15.2 seconds   INR 1.2 0.8 - 1.2    Comment: (NOTE) INR goal varies based on device and disease states. Performed at [facility], [address] [place], [place] [address]   APTT     Status: None   Collection Time: 11/07/20  5:58 PM  Result Value Ref Range   aPTT 34 24 - 36 seconds    Comment: Performed at  Hospital Lab, [address] [place], [place] [address]  Resp Panel by RT-PCR (Flu A&B, Covid) Nasopharyngeal Swab     Status: None   Collection Time: 11/07/20  5:58 PM   Specimen: Nasopharyngeal Swab; Nasopharyngeal(NP) swabs in vial transport medium  Result Value Ref Range   SARS Coronavirus 2 by RT PCR NEGATIVE NEGATIVE    Comment: (NOTE) SARS-CoV-2 target nucleic acids are NOT DETECTED.  The SARS-CoV-2 RNA  is generally detectable in upper respiratory specimens during the acute phase of infection. The lowest concentration of SARS-CoV-2 viral copies this assay can detect is 138 copies/mL. A negative result does not preclude   SARS-Cov-2 infection and should not be used as the sole basis for treatment or other patient management decisions. A negative result may occur with  improper specimen collection/handling, submission of specimen other than nasopharyngeal swab, presence of viral mutation(s) within the areas targeted by this assay, and inadequate number of viral copies(<138 copies/mL). A negative result must be combined with clinical observations, patient history, and epidemiological information. The expected result is Negative.  Fact Sheet for Patients:  https://www.fda.gov/media/152166/download  Fact Sheet for Healthcare Providers:  https://www.fda.gov/media/152162/download  This test is no t yet approved or cleared by the [place] FDA and  has been authorized for detection and/or diagnosis of SARS-CoV-2 by FDA under an Emergency Use Authorization (EUA). This EUA will remain  in effect (meaning this test can be used) for the duration of the COVID-19 declaration under Section 564(b)(1) of the Act, 21 U.S.C.section 360bbb-3(b)(1), unless the authorization is terminated  or revoked sooner.       Influenza A by PCR NEGATIVE NEGATIVE   Influenza B by PCR NEGATIVE NEGATIVE    Comment: (NOTE) The Xpert Xpress SARS-CoV-2/FLU/RSV plus assay is intended as an aid in the diagnosis of influenza from Nasopharyngeal swab specimens and should not be used as a sole basis for treatment. Nasal washings and aspirates are unacceptable for Xpert Xpress SARS-CoV-2/FLU/RSV testing.  Fact Sheet for Patients: https://www.fda.gov/media/152166/download  Fact Sheet for Healthcare Providers: https://www.fda.gov/media/152162/download  This test is not yet approved or cleared by the [place] FDA  and has been authorized for detection and/or diagnosis of SARS-CoV-2 by FDA under an Emergency Use Authorization (EUA). This EUA will remain in effect (meaning this test can be used) for the duration of the COVID-19 declaration under Section 564(b)(1) of the Act, 21 U.S.C. section 360bbb-3(b)(1), unless the authorization is terminated or revoked.  Performed at [facility], [address] [place], [place] [address]     DG Foot 2 Views Left  Result Date: 11/07/2020 CLINICAL DATA:  44-year-old male with left foot infection. EXAM: LEFT FOOT - 2 VIEW COMPARISON:  None. FINDINGS: There is no acute fracture or dislocation. There is mild osteopenia and mild hallux valgus. No periosteal elevation or bone erosion to suggest osteomyelitis. There is diffuse subcutaneous gas along the plantar foot extending into the third toe. There is diffuse soft tissue swelling and subcutaneous edema. IMPRESSION: 1. No acute fracture or dislocation. 2. Diffuse subcutaneous gas along the plantar foot extending into the third toe. Findings concerning for necrotizing fasciitis. Electronically Signed   By: Arash  Radparvar M.D.   On: 11/07/2020 18:25    Review of Systems  Constitutional: Positive for chills and fever.  HENT: Negative for sinus pain and sore throat.   Respiratory: Negative for cough and shortness of breath.   Cardiovascular: Negative for chest pain and palpitations.  Gastrointestinal: Positive for nausea and vomiting.  Endocrine: Negative for polydipsia and polyuria.  Genitourinary: Negative for frequency and urgency.  Musculoskeletal: Positive for myalgias.  Skin:       Significant swelling and redness in his right foot with draining ulceration at the third toe.  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  Psychiatric/Behavioral: Negative for confusion. The patient is not nervous/anxious.    Blood pressure (!) 157/87, pulse 99, temperature (!) 101.2 F  (38.4 C), temperature source Oral, resp. rate 16, height 5' 10" (1.778 m), weight 102.1 kg, SpO2 99 %. Physical Exam Cardiovascular:     Comments: Pulses are intact bilateral. Musculoskeletal:       Comments: Significant pain in the left foot and leg.  Skin:    Comments: Significant erythema and edema in the left foot and lower extremity.  Gangrenous ulceration at the distal aspect of the left third toe.  Large abscess noted in the plantar arch.  Neurological:     Comments: Loss of sensation in the feet.     Assessment/Plan: Assessment: 1.  Gas gangrene left third digit and foot. 2.  Diabetes with neuropathy.  Plan: Discussed with the patient the need for emergent I&D of the left foot for amputation of the third ray with aggressive I&D of the plantar abscess.  Discussed that he is at significant risk for lower extremity amputation due to the extent of the infection.  No guarantees could be given as to the outcome.  Patient currently n.p.o.  Obtain consent for amputation left third toe with partial ray resection and I&D left foot.  Plan for surgery as soon as the OR is able  Todd W Cline 11/07/2020, 7:06 PM     

## 2020-11-07 NOTE — Progress Notes (Signed)
Pharmacy Antibiotic Note  Richard Mendoza is a 44 y.o. male admitted on 11/07/2020 with cellulitis/Gas gangrene left third digit and foot.  Pharmacy has been consulted for Zosyn and Vancomycin dosing. -Hx DM, on doxycycline outpt for left foot infection. -Pt went for emergent I&D of the left foot for amputation of the third ray with aggressive I&D of the plantar abscess  Plan: Patient received Cefepime 2 gm x 1 in ED. Has order for Vanc 1 gram while in ED and order for Vancomycin 1 gram- neither has been charted yest as given, Would give loading dose of 2000mg . Wt 102.1 kg normalized Crcl=58 ml/min. -Will continue with Vancomycin 1250mg  IV q24h per nomogram.  -Zosyn 3.375 gm EI q8h. F/u renal fxn while on this combination of anitbiotics      Height: 5\' 10"  (177.8 cm) Weight: 102.1 kg (225 lb) IBW/kg (Calculated) : 73  Temp (24hrs), Avg:101.2 F (38.4 C), Min:101.2 F (38.4 C), Max:101.2 F (38.4 C)  Recent Labs  Lab 11/07/20 1758  WBC 14.5*  CREATININE 1.63*  LATICACIDVEN 1.8    Estimated Creatinine Clearance: 69.2 mL/min (A) (by C-G formula based on SCr of 1.63 mg/dL (H)).    No Known Allergies  Antimicrobials this admission: Cefepime 12/20 >>   Vanc  12/20? >>    Dose adjustments this admission:    Microbiology results: 12/20 BCx: pend 12/20 UCx: pend    Sputum:      MRSA PCR:    Thank you for allowing pharmacy to be a part of this patient's care.  Araceli Coufal A 11/07/2020 7:39 PM

## 2020-11-07 NOTE — H&P (Addendum)
[place]   PATIENT NAME: Richard Mendoza    MR#:  [id number]  DATE OF BIRTH:  February 04, 1976  DATE OF ADMISSION:  11/07/2020  PRIMARY CARE PHYSICIAN: Tracie Harrier, MD   REQUESTING/REFERRING PHYSICIAN: Delman Kitten, MD  CHIEF COMPLAINT:  Worsening left foot infection  HISTORY OF PRESENT ILLNESS:  Richard Mendoza  is a 44 y.o. 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  His blood glucose levels have been significantly elevated.  He admitted to fever and chills at home.  He has been having nausea without vomiting or diarrhea or abdominal pain.  He denied any dyspnea or cough or wheezing.  He admits to polyuria and polydipsia.  He was given doxycycline by Dr. Vickki Muff and has been on it.  His left foot pain has been worsening with associated erythema, swelling and radiation to his left leg.  No chest pain or dyspnea or palpitations.  No dysuria, oliguria or hematuria or flank pain.  Upon presentation to the emergency room, temperature was 101.2 heart rate of 104 and otherwise normal vital signs.  Labs revealed hyponatremia 121 with hypochloremia of 82, hypokalemia 3.4 and significant hyperglycemia blood glucose of 619 and CO2 of 25 with anion gap of 14.  Alk phos was 134 with albumin of 2.7 and troponin 8.5.  Lactic acid was 1.8 CBC showed leukocytosis of 14.5 with neutrophilia and anemia.  Influenza antigens and COVID-19 PCR came back negative.  Blood cultures were drawn.  Two-view chest x-ray showed diffuse subcutaneous gas along the plantar foot extending into the third toe with findings concerning for necrotizing fasciitis and no acute fracture or dislocation.  Dr. Caryl Comes was notified about the patient and evaluated him at bedside and will be taking him to the OR  The patient was given IV  cefepime and vancomycin, 10 units of IV insulin, 4 mg of IV morphine sulfate twice, 4 mg of IV Zofran and hydration with IV lactated Ringer.  We will start him on IV insulin drip per Endo tool for [facility].  He will be admitted to a stepdown unit for further evaluation and management. PAST MEDICAL HISTORY:   Past Medical History:  Diagnosis Date  . DIABETES MELLITUS, TYPE II, UNCONTROLLED 03/17/2009  . DM 12/08/2008  . HYPERLIPIDEMIA 03/17/2009  . HYPERTENSION 12/08/2008  . 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 ANTERIOR CERVICAL DECOMP/DISCECTOMY FUSION N/A 09/09/2017   Procedure: ANTERIOR CERVICAL DECOMPRESSION/DISCECTOMY FUSION CERVICAL 6- CERVICAL 7;  Surgeon: Ashok Pall, MD;  Location: [place];  Service: Neurosurgery;  Laterality: N/A;  ANTERIOR CERVICAL DECOMPRESSION/DISCECTOMY FUSION CERVICAL 6- CERVICAL 7  . APPENDECTOMY    . I & D EXTREMITY Right 10/03/2017   Procedure: IRRIGATION AND DEBRIDEMENT RIGHT WRIST;  Surgeon: Leanora Cover, MD;  Location: [place];  Service: Orthopedics;  Laterality: Right;  . I & D EXTREMITY Right 11/26/2018   Procedure: IRRIGATION AND DEBRIDEMENT FASCIA ON RIGHT FOOT;  Surgeon: Samara Deist, DPM;  Location: [facility];  Service: Podiatry;  Laterality: Right;  . 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 METATARSAL  HEAD EXCISION Right 05/15/2019   Procedure: OSTECTOMY;MET HEAD 5;  Surgeon: Samara Deist, DPM;  Location: [facility];  Service: Podiatry;  Laterality: Right;  . osteomylitis    . ROTATOR CUFF REPAIR Left     SOCIAL HISTORY:   Social History   Tobacco Use  . Smoking status: Former Smoker    Packs/day: 1.00    Years: 17.00    Pack years: 17.00    Types: Cigarettes    Quit date:  01/14/2019    Years since quitting: 1.8  . Smokeless tobacco: Never Used  Substance Use Topics  . Alcohol use: Yes    Comment: rare    FAMILY HISTORY:   Family History  Problem Relation Age of Onset  . Diabetes Mother   . Heart disease Father   . Diabetes Father   . Arthritis Other   . Hyperlipidemia Other   . Hypertension Other   . Cancer Other        breast  . Mental illness Neg Hx     DRUG ALLERGIES:  No Known Allergies  REVIEW OF SYSTEMS:   ROS As per history of present illness. All pertinent systems were reviewed above. Constitutional, HEENT, cardiovascular, respiratory, GI, GU, musculoskeletal, neuro, psychiatric, endocrine, integumentary and hematologic systems were reviewed and are otherwise negative/unremarkable except for positive findings mentioned above in the HPI.   MEDICATIONS AT HOME:   Prior to Admission medications   Medication Sig Start Date End Date Taking? Authorizing Provider  atorvastatin (LIPITOR) 40 MG tablet Take 40 mg by mouth daily.  08/23/17   [provider]  busPIRone (BUSPAR) 15 MG tablet Take 15 mg by mouth 2 (two) times daily.    [provider]  [employer] SENNA 8.6 MG tablet Take 1 tablet by mouth 2 (two) times daily as needed for constipation.  10/31/17   [provider]  dicyclomine (BENTYL) 10 MG capsule Take 10 mg by mouth 2 (two) times daily as needed. 04/27/20   [provider]  dimenhyDRINATE (DRAMAMINE) 50 MG tablet Take 50 mg by mouth 2 (two) times daily as needed for itching or dizziness.     [provider]  famotidine (PEPCID) 20 MG tablet Take 20 mg by mouth 2 (two) times daily.     [provider]  ferrous sulfate 325 (65 FE) MG tablet Take 325 mg by mouth 2 (two) times daily with a meal.    [provider]  gabapentin (NEURONTIN) 300 MG capsule Take 600 mg by mouth at bedtime.     [provider]  insulin aspart (NOVOLOG) 100 UNIT/ML injection Inject into the  skin. INJECT UP TO 200 UNITS VIA INSULIN PUMP ONCE DAILY    [provider]  Insulin Human (INSULIN PUMP) SOLN Inject 150 each into the skin daily. Inject 150 units subcutaneously via pump daily    [provider]  linaclotide (LINZESS) 72 MCG capsule Take 72 mcg by mouth daily as needed (constipation).    [provider]  lisinopril (PRINIVIL,ZESTRIL) 5 MG tablet Take 5 mg by mouth daily. 10/14/17   [provider]  metoCLOPramide (REGLAN) 10 MG tablet Take 10 mg by mouth 3 (three) times daily before meals.  05/25/19   [provider]  Multiple Vitamin (MULTIVITAMIN WITH MINERALS) TABS tablet Take 1 tablet by mouth daily.    [provider]  ondansetron (ZOFRAN) 8 MG tablet Take 8 mg by mouth every 6 (six) hours as needed for nausea/vomiting.    [provider]  pantoprazole (PROTONIX) 40 MG tablet Take 40 mg by mouth daily. 08/23/17   [provider]  pioglitazone (ACTOS) 30 MG tablet Take 30 mg by mouth daily.  06/29/19   [provider]  sertraline (ZOLOFT) 100 MG tablet Take 100 mg by mouth daily.  10/31/17   [provider]  sildenafil (REVATIO) 20 MG tablet Take 100 mg by mouth. 5 tabs as needed prior to sexual activity    [provider]  vitamin B-12 (CYANOCOBALAMIN) 1000 MCG tablet Take 1,000 mcg by mouth in the morning and at bedtime.  01/27/19   [provider]      VITAL SIGNS:  Blood pressure (!) 157/87, pulse 99, temperature (!) 101.2 F (38.4 C), temperature source Oral, resp. rate 16, height 5' 10"  (1.778 m), weight 102.1 kg, SpO2 99 %.  PHYSICAL EXAMINATION:  Physical Exam  GENERAL:  44 y.o.-year-old Caucasian male patient lying in the bed with no acute distress.  EYES: Pupils equal, round, reactive to light and accommodation. No scleral icterus. Extraocular muscles intact.  HEENT: Head atraumatic, normocephalic. Oropharynx and nasopharynx clear.  NECK:  Supple, no jugular  venous distention. No thyroid enlargement, no tenderness.  LUNGS: Normal breath sounds bilaterally, no wheezing, rales,rhonchi or crepitation. No use of accessory muscles of respiration.  CARDIOVASCULAR: Regular rate and rhythm, S1, S2 normal. No murmurs, rubs, or gallops.  ABDOMEN: Soft, nondistended, nontender. Bowel sounds present. No organomegaly or mass.  EXTREMITIES/musculoskeletal: Left leg and foot pitting edema.  The patient has moderate swelling of the left foot and left third toe with distal necrosis and foul-smelling wound that was exquisitely tender.  Purulent drainage extends towards the plantar surface. NEUROLOGIC: Cranial nerves II through XII are intact. Muscle strength 5/5 in all extremities. Sensation intact. Gait not checked.  PSYCHIATRIC: The patient is alert and oriented x 3.  Normal affect and good eye contact. SKIN: No obvious rash, lesion, or ulcer.   LABORATORY PANEL:   CBC Recent Labs  Lab 11/07/20 1758  WBC 14.5*  HGB 10.1*  HCT 31.6*  PLT 346   ------------------------------------------------------------------------------------------------------------------  Chemistries  Recent Labs  Lab 11/07/20 1758  NA 121*  K 3.4*  CL 82*  CO2 25  GLUCOSE 619*  BUN 16  CREATININE 1.63*  CALCIUM 8.9  AST 25  ALT 17  ALKPHOS 134*  BILITOT 1.1   ------------------------------------------------------------------------------------------------------------------  Cardiac Enzymes No results for input(s): TROPONINI in the last 168 hours. ------------------------------------------------------------------------------------------------------------------  RADIOLOGY:  DG Foot 2 Views Left  Result Date: 11/07/2020 CLINICAL DATA:  44 year old male with left foot infection. EXAM: LEFT FOOT - 2 VIEW COMPARISON:  None. FINDINGS: There is no acute fracture or dislocation. There is mild osteopenia and mild hallux valgus. No periosteal elevation or bone erosion to suggest  osteomyelitis. There is diffuse subcutaneous gas along the plantar foot extending into the third toe. There is diffuse soft tissue swelling and subcutaneous edema. IMPRESSION: 1. No acute fracture or dislocation. 2. Diffuse subcutaneous gas along the plantar foot extending into the third toe. Findings concerning for necrotizing fasciitis. Electronically Signed   By: Anner Crete M.D.   On: 11/07/2020 18:25      IMPRESSION AND PLAN:   1.  Left foot necrotizing fasciitis with associated severe purulent left third toe cellulitis abscess without severe sepsis or septic shock. -The patient will be admitted to a stepdown unit bed. -We will continue IV antibiotic therapy with IV vancomycin, IV Zosyn and clindamycin. -Pain management will be provided. -  Stat podiatry consult was obtained by Dr. Caryl Comes who will take the patient to the OR for left foot debridement.  2.  Nonketotic hyperosmolar hyperglycemia with uncontrolled type II diabetes mellitus with peripheral neuropathy and stable stage 3a chronic kidney disease. -The patient will be admitted to stepdown unit as above. -We will place him on IV insulin drip per Endo tool per [facility] protocol while holding off his insulin pump for now. -We will aggressively hydrate with IV normal saline. -We will continue his Neurontin.  3.  Hyponatremia. -This is likely pseudohyponatremia from hyperglycemia that should correct with management of his [facility].  4.  Hypokalemia. -We will replace his potassium and check magnesium level.  5.  Dyslipidemia. -Continue statin therapy.  6 GERD. -We will continue PPI therapy.  7.  Vitamin B12 deficiency. -Continues vitamin B12.  8.  DVT prophylaxis. -He will be placed on subcutaneous Lovenox postoperatively.   All the records are reviewed and case discussed with ED provider. The plan of care was discussed in details with the patient (and his wife who was with him in the ER ). I answered all questions. The patient  agreed to proceed with the above mentioned plan. Further management will depend upon hospital course.   CODE STATUS: Full code  Status is: Inpatient  Remains inpatient appropriate because:Ongoing active pain requiring inpatient pain management, Ongoing diagnostic testing needed not appropriate for outpatient work up, Unsafe d/c plan, IV treatments appropriate due to intensity of illness or inability to take PO and Inpatient level of care appropriate due to severity of illness   Dispo: The patient is from: Home              Anticipated d/c is to: Home              Anticipated d/c date is: > 3 days              Patient currently is not medically stable to d/c.      TOTAL TIME TAKING CARE OF THIS PATIENT: 55 minutes.    Christel Mormon M.D on 11/07/2020 at 7:05 PM  [facility]   From 7 PM-7 AM, contact night-coverage [email or web address]  CC: Primary care physician; Tracie Harrier, MD

## 2020-11-07 NOTE — Progress Notes (Signed)
Elink monitoring for sepsis. 

## 2020-11-07 NOTE — Interval H&P Note (Signed)
History and Physical Interval Note:  11/07/2020 7:28 PM  Richard Mendoza  has presented today for surgery, with the diagnosis of infection.  The various methods of treatment have been discussed with the patient and family. After consideration of risks, benefits and other options for treatment, the patient has consented to  Procedure(s): INCISION AND DRAINAGE (Left) as a surgical intervention.  The patient's history has been reviewed, patient examined, no change in status, stable for surgery.  I have reviewed the patient's chart and labs.  Questions were answered to the patient's satisfaction.     Ricci Barker

## 2020-11-07 NOTE — ED Triage Notes (Addendum)
Pt via POV from home. Pt states he has an infection in the L foot. Pt noticed a blistered on sole of his L foot last Wednesday, pt states the same day the the blistered busted. Pt has a hx of diabetes. 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 Pt has been seen by Dr. Graciela Husbands and Dr. Ether Griffins and they are already aware. Pt is A&Ox4 and NAD. Assessed pt with Dr. Fanny Bien, MD in triage room. Pulses present. Pt has necrotic tissues to the L middle toe. Wound has foul smell and pt states it is extremely painful. Pt is A&Ox4 and NAD.

## 2020-11-07 NOTE — Progress Notes (Signed)
PHARMACY -  BRIEF ANTIBIOTIC NOTE   Pharmacy has received consult(s) for Vancomycin and Cefepime from an ED provider.  The patient's profile has been reviewed for ht/wt/allergies/indication/available labs.    One time order(s) placed for Cefepime 2 gm and Vancomycin 1 gram  Further antibiotics/pharmacy consults should be ordered by admitting physician if indicated.                       Thank you, Damita Eppard A 11/07/2020  6:03 PM

## 2020-11-07 NOTE — Anesthesia Procedure Notes (Signed)
Procedure Name: Intubation Date/Time: 11/07/2020 7:38 PM Performed by: Waldo Laine, CRNA Pre-anesthesia Checklist: Patient identified, Patient being monitored, Timeout performed, Emergency Drugs available and Suction available Patient Re-evaluated:Patient Re-evaluated prior to induction Oxygen Delivery Method: Circle system utilized Preoxygenation: Pre-oxygenation with 100% oxygen Induction Type: IV induction Ventilation: Mask ventilation without difficulty Laryngoscope Size: 3 and McGraph Grade View: Grade I Tube type: Oral Tube size: 7.5 mm Number of attempts: 1 Airway Equipment and Method: Stylet Placement Confirmation: ETT inserted through vocal cords under direct vision,  positive ETCO2 and breath sounds checked- equal and bilateral Secured at: 21 cm Tube secured with: Tape Dental Injury: Teeth and Oropharynx as per pre-operative assessment

## 2020-11-07 NOTE — Consult Note (Signed)
Reason for Consult: Gas gangrene left foot. Referring Physician: [name] is an 44 y.o. male.  HPI: This is a 44 year old diabetic male known to our practice outpatient.  Was seen by Dr. Vickki Muff last week who recommended hospitalization for a infection in his left third toe.  Patient stated that he had some business out of town that he had to attend to and could not go into the hospital.  Presents today for admission.  Patient states he has had fever and chills as well as nausea and vomiting for the last 2 days.  States he has not eaten in 3 days.  Just drinking Gatorade.  Past Medical History:  Diagnosis Date  . DIABETES MELLITUS, TYPE II, UNCONTROLLED 03/17/2009  . DM 12/08/2008  . HYPERLIPIDEMIA 03/17/2009  . HYPERTENSION 12/08/2008  . 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 ANTERIOR CERVICAL DECOMP/DISCECTOMY FUSION N/A 09/09/2017   Procedure: ANTERIOR CERVICAL DECOMPRESSION/DISCECTOMY FUSION CERVICAL 6- CERVICAL 7;  Surgeon: Ashok Pall, MD;  Location: [place];  Service: Neurosurgery;  Laterality: N/A;  ANTERIOR CERVICAL DECOMPRESSION/DISCECTOMY FUSION CERVICAL 6- CERVICAL 7  . APPENDECTOMY    . I & D EXTREMITY Right 10/03/2017   Procedure: IRRIGATION AND DEBRIDEMENT RIGHT WRIST;  Surgeon: Leanora Cover, MD;  Location: [employer];  Service: Orthopedics;  Laterality: Right;  . I & D EXTREMITY Right 11/26/2018   Procedure: IRRIGATION AND DEBRIDEMENT FASCIA ON RIGHT FOOT;  Surgeon: Samara Deist, DPM;  Location: [facility];  Service: Podiatry;  Laterality: Right;  . 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 METATARSAL HEAD EXCISION Right 05/15/2019   Procedure: OSTECTOMY;MET HEAD 5;  Surgeon: Samara Deist, DPM;  Location: [facility];  Service: Podiatry;  Laterality: Right;  .  osteomylitis    . ROTATOR CUFF REPAIR Left     Family History  Problem Relation Age of Onset  . Diabetes Mother   . Heart disease Father   . Diabetes Father   . Arthritis Other   . Hyperlipidemia Other   . Hypertension Other   . Cancer Other        breast  . Mental illness Neg Hx     Social History:  reports that he quit smoking about 21 months ago. His smoking use included cigarettes. He has a 17.00 pack-year smoking history. He has never used smokeless tobacco. He reports current alcohol use. He reports that he does not use drugs.  Allergies: No Known Allergies  Medications:  Scheduled: . insulin aspart  10 Units Intravenous Once  .  morphine injection  4 mg Intravenous Once    Results for orders placed or performed during the hospital encounter of 11/07/20 (from the past 48 hour(s))  Lactic acid, plasma     Status: None   Collection Time: 11/07/20  5:58 PM  Result Value Ref Range   Lactic Acid, Venous 1.8 0.5 - 1.9 mmol/L    Comment: Performed at [facility], [address] [place], [place] [address]  Comprehensive metabolic panel     Status: Abnormal   Collection Time: 11/07/20  5:58 PM  Result Value Ref Range   Sodium 121 (L) 135 - 145 mmol/L   Potassium 3.4 (L) 3.5 - 5.1 mmol/L   Chloride 82 (L) 98 - 111 mmol/L   CO2 25 22 -  32 mmol/L   Glucose, Bld 619 (HH) 70 - 99 mg/dL    Comment: CRITICAL RESULT CALLED TO, READ BACK BY AND VERIFIED WITH [name] _0  ON 11/07/20 SKL Glucose reference range applies only to samples taken after fasting for at least 8 hours.    BUN 16 6 - 20 mg/dL   Creatinine, Ser 1.63 (H) 0.61 - 1.24 mg/dL   Calcium 8.9 8.9 - 10.3 mg/dL   Total Protein 8.5 (H) 6.5 - 8.1 g/dL   Albumin 2.7 (L) 3.5 - 5.0 g/dL   AST 25 15 - 41 U/L   ALT 17 0 - 44 U/L   Alkaline Phosphatase 134 (H) 38 - 126 U/L   Total Bilirubin 1.1 0.3 - 1.2 mg/dL   GFR, Estimated 53 (L) >60 mL/min    Comment: (NOTE) Calculated using the CKD-EPI Creatinine  Equation (2021)    Anion gap 14 5 - 15    Comment: Performed at [facility], [address] [place], [place] [address]  CBC WITH DIFFERENTIAL     Status: Abnormal   Collection Time: 11/07/20  5:58 PM  Result Value Ref Range   WBC 14.5 (H) 4.0 - 10.5 K/uL   RBC 3.78 (L) 4.22 - 5.81 MIL/uL   Hemoglobin 10.1 (L) 13.0 - 17.0 g/dL   HCT 31.6 (L) 39.0 - 52.0 %   MCV 83.6 80.0 - 100.0 fL   MCH 26.7 26.0 - 34.0 pg   MCHC 32.0 30.0 - 36.0 g/dL   RDW 14.4 11.5 - 15.5 %   Platelets 346 150 - 400 K/uL   nRBC 0.0 0.0 - 0.2 %   Neutrophils Relative % 89 %   Neutro Abs 12.9 (H) 1.7 - 7.7 K/uL   Lymphocytes Relative 5 %   Lymphs Abs 0.8 0.7 - 4.0 K/uL   Monocytes Relative 5 %   Monocytes Absolute 0.7 0.1 - 1.0 K/uL   Eosinophils Relative 0 %   Eosinophils Absolute 0.0 0.0 - 0.5 K/uL   Basophils Relative 0 %   Basophils Absolute 0.0 0.0 - 0.1 K/uL   Immature Granulocytes 1 %   Abs Immature Granulocytes 0.15 (H) 0.00 - 0.07 K/uL    Comment: Performed at [facility], [address] [place], [place] [address]  Protime-INR     Status: None   Collection Time: 11/07/20  5:58 PM  Result Value Ref Range   Prothrombin Time 14.8 11.4 - 15.2 seconds   INR 1.2 0.8 - 1.2    Comment: (NOTE) INR goal varies based on device and disease states. Performed at [facility], [address] [place], [place] [address]   APTT     Status: None   Collection Time: 11/07/20  5:58 PM  Result Value Ref Range   aPTT 34 24 - 36 seconds    Comment: Performed at [facility], [address] [place], [place] [address]  Resp Panel by RT-PCR (Flu A&B, Covid) Nasopharyngeal Swab     Status: None   Collection Time: 11/07/20  5:58 PM   Specimen: Nasopharyngeal Swab; Nasopharyngeal(NP) swabs in vial transport medium  Result Value Ref Range   SARS Coronavirus 2 by RT PCR NEGATIVE NEGATIVE    Comment: (NOTE) SARS-CoV-2 target nucleic acids are NOT DETECTED.  The SARS-CoV-2 RNA  is generally detectable in upper respiratory specimens during the acute phase of infection. The lowest concentration of SARS-CoV-2 viral copies this assay can detect is 138 copies/mL. A negative result does not preclude  SARS-Cov-2 infection and should not be used as the sole basis for treatment or other patient management decisions. A negative result may occur with  improper specimen collection/handling, submission of specimen other than nasopharyngeal swab, presence of viral mutation(s) within the areas targeted by this assay, and inadequate number of viral copies(<138 copies/mL). A negative result must be combined with clinical observations, patient history, and epidemiological information. The expected result is Negative.  Fact Sheet for Patients:  EntrepreneurPulse.com.au  Fact Sheet for Healthcare Providers:  IncredibleEmployment.be  This test is no t yet approved or cleared by the [place] FDA and  has been authorized for detection and/or diagnosis of SARS-CoV-2 by FDA under an Emergency Use Authorization (EUA). This EUA will remain  in effect (meaning this test can be used) for the duration of the COVID-19 declaration under Section 564(b)(1) of the Act, 21 U.S.C.section 360bbb-3(b)(1), unless the authorization is terminated  or revoked sooner.       Influenza A by PCR NEGATIVE NEGATIVE   Influenza B by PCR NEGATIVE NEGATIVE    Comment: (NOTE) The Xpert Xpress SARS-CoV-2/FLU/RSV plus assay is intended as an aid in the diagnosis of influenza from Nasopharyngeal swab specimens and should not be used as a sole basis for treatment. Nasal washings and aspirates are unacceptable for Xpert Xpress SARS-CoV-2/FLU/RSV testing.  Fact Sheet for Patients: EntrepreneurPulse.com.au  Fact Sheet for Healthcare Providers: IncredibleEmployment.be  This test is not yet approved or cleared by the [place] FDA  and has been authorized for detection and/or diagnosis of SARS-CoV-2 by FDA under an Emergency Use Authorization (EUA). This EUA will remain in effect (meaning this test can be used) for the duration of the COVID-19 declaration under Section 564(b)(1) of the Act, 21 U.S.C. section 360bbb-3(b)(1), unless the authorization is terminated or revoked.  Performed at [facility], [address] [place], [place] [address]     DG Foot 2 Views Left  Result Date: 11/07/2020 CLINICAL DATA:  44 year old male with left foot infection. EXAM: LEFT FOOT - 2 VIEW COMPARISON:  None. FINDINGS: There is no acute fracture or dislocation. There is mild osteopenia and mild hallux valgus. No periosteal elevation or bone erosion to suggest osteomyelitis. There is diffuse subcutaneous gas along the plantar foot extending into the third toe. There is diffuse soft tissue swelling and subcutaneous edema. IMPRESSION: 1. No acute fracture or dislocation. 2. Diffuse subcutaneous gas along the plantar foot extending into the third toe. Findings concerning for necrotizing fasciitis. Electronically Signed   By: Anner Crete M.D.   On: 11/07/2020 18:25    Review of Systems  Constitutional: Positive for chills and fever.  HENT: Negative for sinus pain and sore throat.   Respiratory: Negative for cough and shortness of breath.   Cardiovascular: Negative for chest pain and palpitations.  Gastrointestinal: Positive for nausea and vomiting.  Endocrine: Negative for polydipsia and polyuria.  Genitourinary: Negative for frequency and urgency.  Musculoskeletal: Positive for myalgias.  Skin:       Significant swelling and redness in his right foot with draining ulceration at the third toe.  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  Psychiatric/Behavioral: Negative for confusion. The patient is not nervous/anxious.    Blood pressure (!) 157/87, pulse 99, temperature (!) 101.2 F  (38.4 C), temperature source Oral, resp. rate 16, height _0  (1.778 m), weight 102.1 kg, SpO2 99 %. Physical Exam Cardiovascular:     Comments: Pulses are intact bilateral. Musculoskeletal:  Comments: Significant pain in the left foot and leg.  Skin:    Comments: Significant erythema and edema in the left foot and lower extremity.  Gangrenous ulceration at the distal aspect of the left third toe.  Large abscess noted in the plantar arch.  Neurological:     Comments: Loss of sensation in the feet.     Assessment/Plan: Assessment: 1.  Gas gangrene left third digit and foot. 2.  Diabetes with neuropathy.  Plan: Discussed with the patient the need for emergent I&D of the left foot for amputation of the third ray with aggressive I&D of the plantar abscess.  Discussed that he is at significant risk for lower extremity amputation due to the extent of the infection.  No guarantees could be given as to the outcome.  Patient currently n.p.o.  Obtain consent for amputation left third toe with partial ray resection and I&D left foot.  Plan for surgery as soon as the OR is able  Durward Fortes 11/07/2020, 7:06 PM

## 2020-11-08 ENCOUNTER — Inpatient Hospital Stay: Payer: Medicaid Other

## 2020-11-08 ENCOUNTER — Encounter: Payer: Self-pay | Admitting: Podiatry

## 2020-11-08 DIAGNOSIS — D649 Anemia, unspecified: Secondary | ICD-10-CM

## 2020-11-08 DIAGNOSIS — N189 Chronic kidney disease, unspecified: Secondary | ICD-10-CM

## 2020-11-08 DIAGNOSIS — R7881 Bacteremia: Secondary | ICD-10-CM

## 2020-11-08 DIAGNOSIS — L97509 Non-pressure chronic ulcer of other part of unspecified foot with unspecified severity: Secondary | ICD-10-CM

## 2020-11-08 DIAGNOSIS — E11621 Type 2 diabetes mellitus with foot ulcer: Secondary | ICD-10-CM

## 2020-11-08 DIAGNOSIS — M545 Low back pain, unspecified: Secondary | ICD-10-CM | POA: Diagnosis not present

## 2020-11-08 DIAGNOSIS — E1122 Type 2 diabetes mellitus with diabetic chronic kidney disease: Secondary | ICD-10-CM

## 2020-11-08 DIAGNOSIS — M726 Necrotizing fasciitis: Secondary | ICD-10-CM

## 2020-11-08 DIAGNOSIS — E1165 Type 2 diabetes mellitus with hyperglycemia: Secondary | ICD-10-CM

## 2020-11-08 DIAGNOSIS — I96 Gangrene, not elsewhere classified: Secondary | ICD-10-CM

## 2020-11-08 DIAGNOSIS — Z794 Long term (current) use of insulin: Secondary | ICD-10-CM

## 2020-11-08 DIAGNOSIS — B951 Streptococcus, group B, as the cause of diseases classified elsewhere: Secondary | ICD-10-CM

## 2020-11-08 LAB — BASIC METABOLIC PANEL
Anion gap: 6 (ref 5–15)
Anion gap: 9 (ref 5–15)
Anion gap: 9 (ref 5–15)
Anion gap: 9 (ref 5–15)
BUN: 12 mg/dL (ref 6–20)
BUN: 13 mg/dL (ref 6–20)
BUN: 14 mg/dL (ref 6–20)
BUN: 17 mg/dL (ref 6–20)
CO2: 25 mmol/L (ref 22–32)
CO2: 25 mmol/L (ref 22–32)
CO2: 26 mmol/L (ref 22–32)
CO2: 27 mmol/L (ref 22–32)
Calcium: 7.8 mg/dL — ABNORMAL LOW (ref 8.9–10.3)
Calcium: 7.9 mg/dL — ABNORMAL LOW (ref 8.9–10.3)
Calcium: 7.9 mg/dL — ABNORMAL LOW (ref 8.9–10.3)
Calcium: 8.3 mg/dL — ABNORMAL LOW (ref 8.9–10.3)
Chloride: 93 mmol/L — ABNORMAL LOW (ref 98–111)
Chloride: 95 mmol/L — ABNORMAL LOW (ref 98–111)
Chloride: 96 mmol/L — ABNORMAL LOW (ref 98–111)
Chloride: 96 mmol/L — ABNORMAL LOW (ref 98–111)
Creatinine, Ser: 1.23 mg/dL (ref 0.61–1.24)
Creatinine, Ser: 1.34 mg/dL — ABNORMAL HIGH (ref 0.61–1.24)
Creatinine, Ser: 1.35 mg/dL — ABNORMAL HIGH (ref 0.61–1.24)
Creatinine, Ser: 1.46 mg/dL — ABNORMAL HIGH (ref 0.61–1.24)
GFR, Estimated: >60 mL/min (ref >60)
GFR, Estimated: >60 mL/min (ref >60)
GFR, Estimated: >60 mL/min (ref >60)
GFR, Estimated: >60 mL/min (ref >60)
Glucose, Bld: 167 mg/dL — ABNORMAL HIGH (ref 70–99)
Glucose, Bld: 168 mg/dL — ABNORMAL HIGH (ref 70–99)
Glucose, Bld: 183 mg/dL — ABNORMAL HIGH (ref 70–99)
Glucose, Bld: 413 mg/dL — ABNORMAL HIGH (ref 70–99)
Potassium: 2.7 mmol/L — CL (ref 3.5–5.1)
Potassium: 2.9 mmol/L — ABNORMAL LOW (ref 3.5–5.1)
Potassium: 2.9 mmol/L — ABNORMAL LOW (ref 3.5–5.1)
Potassium: 3 mmol/L — ABNORMAL LOW (ref 3.5–5.1)
Sodium: 126 mmol/L — ABNORMAL LOW (ref 135–145)
Sodium: 129 mmol/L — ABNORMAL LOW (ref 135–145)
Sodium: 130 mmol/L — ABNORMAL LOW (ref 135–145)
Sodium: 131 mmol/L — ABNORMAL LOW (ref 135–145)

## 2020-11-08 LAB — CBC
HCT: 25 % — ABNORMAL LOW (ref 39.0–52.0)
HCT: 24.9 % — ABNORMAL LOW (ref 39.0–52.0)
Hemoglobin: 8 g/dL — ABNORMAL LOW (ref 13.0–17.0)
Hemoglobin: 7.9 g/dL — ABNORMAL LOW (ref 13.0–17.0)
MCH: 26.8 pg (ref 26.0–34.0)
MCH: 26.7 pg (ref 26.0–34.0)
MCHC: 32 g/dL (ref 30.0–36.0)
MCHC: 31.7 g/dL (ref 30.0–36.0)
MCV: 83.6 fL (ref 80.0–100.0)
MCV: 84.1 fL (ref 80.0–100.0)
Platelets: 266 10*3/uL (ref 150–400)
Platelets: 250 10*3/uL (ref 150–400)
RBC: 2.99 MIL/uL — ABNORMAL LOW (ref 4.22–5.81)
RBC: 2.96 MIL/uL — ABNORMAL LOW (ref 4.22–5.81)
RDW: 14.2 % (ref 11.5–15.5)
RDW: 14.4 % (ref 11.5–15.5)
WBC: 11.8 10*3/uL — ABNORMAL HIGH (ref 4.0–10.5)
WBC: 12.3 10*3/uL — ABNORMAL HIGH (ref 4.0–10.5)
nRBC: 0 % (ref 0.0–0.2)
nRBC: 0 % (ref 0.0–0.2)

## 2020-11-08 LAB — GLUCOSE, CAPILLARY
Glucose-Capillary: 153 mg/dL — ABNORMAL HIGH (ref 70–99)
Glucose-Capillary: 162 mg/dL — ABNORMAL HIGH (ref 70–99)
Glucose-Capillary: 171 mg/dL — ABNORMAL HIGH (ref 70–99)
Glucose-Capillary: 176 mg/dL — ABNORMAL HIGH (ref 70–99)
Glucose-Capillary: 182 mg/dL — ABNORMAL HIGH (ref 70–99)
Glucose-Capillary: 184 mg/dL — ABNORMAL HIGH (ref 70–99)
Glucose-Capillary: 228 mg/dL — ABNORMAL HIGH (ref 70–99)
Glucose-Capillary: 234 mg/dL — ABNORMAL HIGH (ref 70–99)
Glucose-Capillary: 245 mg/dL — ABNORMAL HIGH (ref 70–99)
Glucose-Capillary: 287 mg/dL — ABNORMAL HIGH (ref 70–99)

## 2020-11-08 LAB — BLOOD CULTURE ID PANEL (REFLEXED) - BCID2

## 2020-11-08 LAB — HEMOGLOBIN A1C
Hgb A1c MFr Bld: 12 % — ABNORMAL HIGH (ref 4.8–5.6)
Mean Plasma Glucose: 297.7 mg/dL

## 2020-11-08 LAB — URINE CULTURE: Culture: NO GROWTH

## 2020-11-08 LAB — LACTIC ACID, PLASMA: Lactic Acid, Venous: 2.8 mmol/L (ref 0.5–1.9)

## 2020-11-08 LAB — MRSA PCR SCREENING: MRSA by PCR: NEGATIVE

## 2020-11-08 LAB — OSMOLALITY: Osmolality: 284 mOsm/kg (ref 275–295)

## 2020-11-08 LAB — HIV ANTIBODY (ROUTINE TESTING W REFLEX): HIV Screen 4th Generation wRfx: NONREACTIVE

## 2020-11-08 MED ORDER — INSULIN ASPART 100 UNIT/ML ~~LOC~~ SOLN
6.0000 [IU] | Freq: Three times a day (TID) | SUBCUTANEOUS | Status: DC
  Administered 2020-11-08 – 2020-11-09 (×3): 6 [IU] via SUBCUTANEOUS
  Filled 2020-11-08 (×2): qty 1

## 2020-11-08 MED ORDER — SODIUM CHLORIDE 0.9 % IV BOLUS
1000.0000 mL | Freq: Once | INTRAVENOUS | Status: AC
  Administered 2020-11-08: 1000 mL via INTRAVENOUS

## 2020-11-08 MED ORDER — VANCOMYCIN HCL 750 MG/150ML IV SOLN
750.0000 mg | Freq: Two times a day (BID) | INTRAVENOUS | Status: DC
  Administered 2020-11-08 – 2020-11-10 (×5): 750 mg via INTRAVENOUS
  Filled 2020-11-08 (×6): qty 150

## 2020-11-08 MED ORDER — METRONIDAZOLE IN NACL 5-0.79 MG/ML-% IV SOLN
500.0000 mg | Freq: Three times a day (TID) | INTRAVENOUS | Status: DC
  Administered 2020-11-08 – 2020-11-11 (×8): 500 mg via INTRAVENOUS
  Filled 2020-11-08 (×12): qty 100

## 2020-11-08 MED ORDER — SODIUM CHLORIDE 0.9 % IV SOLN
3.0000 g | Freq: Four times a day (QID) | INTRAVENOUS | Status: DC
  Administered 2020-11-08: 3 g via INTRAVENOUS
  Filled 2020-11-08: qty 3
  Filled 2020-11-08 (×3): qty 8

## 2020-11-08 MED ORDER — INSULIN GLARGINE 100 UNIT/ML ~~LOC~~ SOLN
70.0000 [IU] | Freq: Every day | SUBCUTANEOUS | Status: DC
  Administered 2020-11-08 – 2020-11-09 (×2): 70 [IU] via SUBCUTANEOUS
  Filled 2020-11-08 (×2): qty 0.7

## 2020-11-08 MED ORDER — IOHEXOL 300 MG/ML  SOLN
100.0000 mL | Freq: Once | INTRAMUSCULAR | Status: AC | PRN
  Administered 2020-11-08: 100 mL via INTRAVENOUS

## 2020-11-08 MED ORDER — INSULIN ASPART 100 UNIT/ML ~~LOC~~ SOLN
0.0000 [IU] | Freq: Three times a day (TID) | SUBCUTANEOUS | Status: DC
  Administered 2020-11-08 (×2): 7 [IU] via SUBCUTANEOUS
  Administered 2020-11-09: 11 [IU] via SUBCUTANEOUS
  Administered 2020-11-09: 4 [IU] via SUBCUTANEOUS
  Administered 2020-11-09: 7 [IU] via SUBCUTANEOUS
  Administered 2020-11-09: 4 [IU] via SUBCUTANEOUS
  Administered 2020-11-10: 7 [IU] via SUBCUTANEOUS
  Administered 2020-11-10 – 2020-11-11 (×3): 4 [IU] via SUBCUTANEOUS
  Administered 2020-11-12: 7 [IU] via SUBCUTANEOUS
  Administered 2020-11-12 (×3): 11 [IU] via SUBCUTANEOUS
  Administered 2020-11-13: 20 [IU] via SUBCUTANEOUS
  Administered 2020-11-13: 4 [IU] via SUBCUTANEOUS
  Administered 2020-11-13: 7 [IU] via SUBCUTANEOUS
  Administered 2020-11-13: 11 [IU] via SUBCUTANEOUS
  Administered 2020-11-14: 7 [IU] via SUBCUTANEOUS
  Administered 2020-11-14: 4 [IU] via SUBCUTANEOUS
  Administered 2020-11-14: 11 [IU] via SUBCUTANEOUS
  Administered 2020-11-14: 4 [IU] via SUBCUTANEOUS
  Administered 2020-11-15: 7 [IU] via SUBCUTANEOUS
  Administered 2020-11-15: 4 [IU] via SUBCUTANEOUS
  Administered 2020-11-15: 7 [IU] via SUBCUTANEOUS
  Administered 2020-11-15: 11 [IU] via SUBCUTANEOUS
  Administered 2020-11-16: 20 [IU] via SUBCUTANEOUS
  Administered 2020-11-16: 4 [IU] via SUBCUTANEOUS
  Administered 2020-11-16: 7 [IU] via SUBCUTANEOUS
  Administered 2020-11-17: 11 [IU] via SUBCUTANEOUS
  Administered 2020-11-17: 15 [IU] via SUBCUTANEOUS
  Administered 2020-11-17 (×2): 7 [IU] via SUBCUTANEOUS
  Administered 2020-11-18: 4 [IU] via SUBCUTANEOUS
  Administered 2020-11-18 (×2): 7 [IU] via SUBCUTANEOUS
  Administered 2020-11-18: 3 [IU] via SUBCUTANEOUS
  Administered 2020-11-19 (×2): 7 [IU] via SUBCUTANEOUS
  Administered 2020-11-19: 4 [IU] via SUBCUTANEOUS
  Administered 2020-11-19: 7 [IU] via SUBCUTANEOUS
  Administered 2020-11-20: 3 [IU] via SUBCUTANEOUS
  Administered 2020-11-20 (×2): 11 [IU] via SUBCUTANEOUS
  Administered 2020-11-20: 7 [IU] via SUBCUTANEOUS
  Administered 2020-11-21: 15 [IU] via SUBCUTANEOUS
  Administered 2020-11-21 (×2): 4 [IU] via SUBCUTANEOUS
  Filled 2020-11-08 (×48): qty 1

## 2020-11-08 MED ORDER — SODIUM CHLORIDE 0.9 % IV SOLN
2.0000 g | Freq: Two times a day (BID) | INTRAVENOUS | Status: DC
  Administered 2020-11-08: 2 g via INTRAVENOUS
  Filled 2020-11-08 (×3): qty 2

## 2020-11-08 MED ORDER — HYDROMORPHONE HCL 1 MG/ML IJ SOLN
1.0000 mg | INTRAMUSCULAR | Status: DC | PRN
  Administered 2020-11-08 – 2020-11-09 (×5): 1 mg via INTRAVENOUS
  Filled 2020-11-08 (×5): qty 1

## 2020-11-08 MED ORDER — POTASSIUM CHLORIDE CRYS ER 20 MEQ PO TBCR
40.0000 meq | EXTENDED_RELEASE_TABLET | Freq: Two times a day (BID) | ORAL | Status: AC
  Administered 2020-11-08 (×2): 40 meq via ORAL
  Filled 2020-11-08 (×2): qty 2

## 2020-11-08 NOTE — Anesthesia Postprocedure Evaluation (Signed)
Anesthesia Post Note  Patient: Richard Mendoza  Procedure(s) Performed: INCISION AND DRAINAGE (Left Foot) 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 (Left Third Toe)  Patient location during evaluation: PACU Anesthesia Type: General Level of consciousness: awake and alert Pain management: pain level controlled Vital Signs Assessment: post-procedure vital signs reviewed and stable Respiratory status: spontaneous breathing, nonlabored ventilation, respiratory function stable and patient connected to nasal cannula oxygen Cardiovascular status: blood pressure returned to baseline and stable Postop Assessment: no apparent nausea or vomiting Anesthetic complications: no   No complications documented.   Last Vitals:  Vitals:   11/08/20 0200 11/08/20 0400  BP: 130/76 132/75  Pulse: 98 97  Resp: 20 (!) 30  Temp:  36.9 C  SpO2: 93% 95%    Last Pain:  Vitals:   11/08/20 0400  TempSrc: Oral  PainSc: 0-No pain                 Cleda Mccreedy Lovell Roe

## 2020-11-08 NOTE — Progress Notes (Signed)
Notified bedside nurse of need to order repeat lactic acid.  

## 2020-11-08 NOTE — Progress Notes (Signed)
Pharmacy Antibiotic Note  Richard Mendoza is a 44 y.o. male admitted on 11/07/2020 with cellulitis/Gas gangrene left third digit and foot.  Pharmacy has been consulted for Zosyn and Vancomycin dosing.  -Hx DM, on doxycycline outpt for left foot infection. -on 12/20 - Pt went for emergent I&D of the left foot for amputation of the third ray with aggressive I&D of the plantar abscess - Patient with PMH of Right (opposite of current infected foot) foot infection/toe amputation.  Previous cultures grew MRSA, MRSE, prevotella, Enterobacter, E. Faecalis (amp-susc), S. constellatus  Plan:  Vancomycin 1gm x 1 charted (in OR) as given, additional orders placed to make 2gm loading dose but not charted as given  Based on current renal function and using normalized CrCL, adjust vancomycin to 750mg  IV q12h  Continue Zosyn 3.375 gm EI q8h.  F/u renal fxn while on this combination of anitbiotics  Check trough in 2-3 days if remains on vancomycin (await final OR cultures).  Check trough sooner if renal function dictates  Height: 5\' 10"  (177.8 cm) Weight: 102.1 kg (225 lb) IBW/kg (Calculated) : 73  Temp (24hrs), Avg:99.6 F (37.6 C), Min:98.5 F (36.9 C), Max:101.2 F (38.4 C)  Recent Labs  Lab 11/07/20 1758 11/07/20 2343 11/08/20 0343 11/08/20 0726 11/08/20 1206  WBC 14.5*  --  11.8*  --  12.3*  CREATININE 1.63* 1.46* 1.23 1.34* 1.35*  LATICACIDVEN 1.8 2.8*  --   --   --     Estimated Creatinine Clearance: 83.6 mL/min (A) (by C-G formula based on SCr of 1.35 mg/dL (H)).    No Known Allergies  Antimicrobials this admission: Cefepime x1 12/20 Pip/tazo 12/20 >> Vanc  12/20 >>    Dose adjustments this admission:    Microbiology results: 12/20 BCx: NGTD 12/20 UCx: pend  12/20 Left Toe (bone): 12/20: Left foot abscess:  MRSA PCR:  Neg  Thank you for allowing pharmacy to be a part of this patient's care.  1/21, PharmD, BCPS.   Work Cell: [phone] 11/08/2020 1:04  PM

## 2020-11-08 NOTE — Progress Notes (Signed)
PHARMACY - PHYSICIAN COMMUNICATION CRITICAL VALUE ALERT - BLOOD CULTURE IDENTIFICATION (BCID)  Richard Mendoza is an 44 y.o. male who presented to [facility] on 11/07/2020 with a chief complaint of diabetic foot wound  Assessment: Patient with history of diabetic foot wounds,  Went to OR last evening.  Blood culture with 1/2 sets with GPC, BCID = group B strep.  Previous foot infections have been polymicrobial, including MRSA, Enterococcus, Enterobacter  Name of physician (or Provider) Contacted: Dr.  Mayford Knife  Current antibiotics: vancomycin and ampicillin/sulbactam  Changes to prescribed antibiotics recommended:  Patient is on recommended antibiotics - No changes needed   Results for orders placed or performed during the hospital encounter of 11/07/20  Blood Culture ID Panel (Reflexed) (Collected: 11/07/2020  5:58 PM)  Result Value Ref Range   Enterococcus faecalis NOT DETECTED NOT DETECTED   Enterococcus Faecium NOT DETECTED NOT DETECTED   Listeria monocytogenes NOT DETECTED NOT DETECTED   Staphylococcus species NOT DETECTED NOT DETECTED   Staphylococcus aureus (BCID) NOT DETECTED NOT DETECTED   Staphylococcus epidermidis NOT DETECTED NOT DETECTED   Staphylococcus lugdunensis NOT DETECTED NOT DETECTED   Streptococcus species DETECTED (A) NOT DETECTED   Streptococcus agalactiae DETECTED (A) NOT DETECTED   Streptococcus pneumoniae NOT DETECTED NOT DETECTED   Streptococcus pyogenes NOT DETECTED NOT DETECTED   A.calcoaceticus-baumannii NOT DETECTED NOT DETECTED   Bacteroides fragilis NOT DETECTED NOT DETECTED   Enterobacterales NOT DETECTED NOT DETECTED   Enterobacter cloacae complex NOT DETECTED NOT DETECTED   Escherichia coli NOT DETECTED NOT DETECTED   Klebsiella aerogenes NOT DETECTED NOT DETECTED   Klebsiella oxytoca NOT DETECTED NOT DETECTED   Klebsiella pneumoniae NOT DETECTED NOT DETECTED   Proteus species NOT DETECTED NOT DETECTED   Salmonella species NOT DETECTED NOT  DETECTED   Serratia marcescens NOT DETECTED NOT DETECTED   Haemophilus influenzae NOT DETECTED NOT DETECTED   Neisseria meningitidis NOT DETECTED NOT DETECTED   Pseudomonas aeruginosa NOT DETECTED NOT DETECTED   Stenotrophomonas maltophilia NOT DETECTED NOT DETECTED   Candida albicans NOT DETECTED NOT DETECTED   Candida auris NOT DETECTED NOT DETECTED   Candida glabrata NOT DETECTED NOT DETECTED   Candida krusei NOT DETECTED NOT DETECTED   Candida parapsilosis NOT DETECTED NOT DETECTED   Candida tropicalis NOT DETECTED NOT DETECTED   Cryptococcus neoformans/gattii NOT DETECTED NOT DETECTED   Juliette Alcide, PharmD, BCPS.   Work Cell: [phone] 11/08/2020 1:42 PM

## 2020-11-08 NOTE — Progress Notes (Signed)
PROGRESS NOTE    Richard Mendoza  WUJ:[id number] DOB: 05/19/1976 DOA: 11/07/2020 PCP: Barbette ReichmannHande, Vishwanath, MD   Assessment & Plan:   Active Problems:   Necrotizing fasciitis of ankle and foot (HCC)   Left foot necrotizing fasciitis: with associated severe purulent left third toe cellulitis abscess. 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 Continue on IV vanco, zosyn. Podiatry recs apprec. ID consulted. 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 CT L spine ordered to r/o infection. Morphine, dilaudid, & percocet prn for pain   Nonketotic hyperosmolar hyperglycemia:  with poorly controlled DM2. Weaned off of insulin drip. Continue on lantus, aspart & SSI w/ accuchecks. Continue on IVFs. DM coordinator consulted.   Leukocytosis: secondary to above infection. Continue on IV abxs   DM2: poorly controlled. HbA1c is 12.0. Continue on insulin drip and wean as tolerated  CKDIIIa: Cr is trending up from day prior. Avoid nephrotoxic meds. Continue on IVFs  Peripheral neuropathy: continue on home dose of gabapentin   Hyponatremia: likely pseudohyponatremia from hyperglycemia  Hypokalemia: KCl repleted. Will continue to monitor   HLD: continue on statin   GERD: continue on PPI   Vitamin B12 deficiency: continue on vitamin B12 supplements   Likely ACD: likely secondary to CKD. No need for a transfusion at this time.    DVT prophylaxis: lovenox Code Status: full  Family Communication: discussed pt's care w/ family at bedside and answered their questions  Disposition Plan: depends on PT/OT    Status is: Inpatient  Remains inpatient appropriate because:Ongoing diagnostic testing needed not appropriate for outpatient work up, IV treatments appropriate due to intensity of illness or inability to take PO and Inpatient level of care  appropriate due to severity of illness   Dispo: The patient is from: Home              Anticipated d/c is to: SNF              Anticipated d/c date is: > 3 days              Patient currently is not medically stable to d/c.   Consultants:   Podiatry  ID   Procedures:    Antimicrobials: vanco & zosyn   Subjective: Pt c/o low back pain & left foot pain   Objective: Vitals:   11/08/20 0500 11/08/20 0600 11/08/20 0700 11/08/20 0738  BP: 130/74 127/74 126/73 126/73  Pulse: 94 93 92   Resp: (!) 8 13 15    Temp:    99.9 F (37.7 C)  TempSrc:    Oral  SpO2: 92% 93% 95%   Weight:      Height:        Intake/Output Summary (Last 24 hours) at 11/08/2020 0832 Last data filed at 11/08/2020 0636 Gross per 24 hour  Intake 500 ml  Output 1850 ml  Net -1350 ml   Filed Weights   11/07/20 1752  Weight: 102.1 kg    Examination:  General exam: Appears calm and comfortable  Respiratory system: Clear to auscultation. Respiratory effort normal. Cardiovascular system: S1 & S2 +. No rubs, gallops or clicks.  Gastrointestinal system: Abdomen is nondistended, soft and nontender.  Normal bowel sounds heard. Central nervous system: Alert and oriented. Moves all extremities  Extremities: left foot is dressed & dressing is C/D/I Psychiatry: Judgement and insight appear normal.  Mood & affect appropriate.     Data Reviewed: I have personally reviewed following labs and imaging studies  CBC: Recent Labs  Lab 11/07/20 1758 11/08/20 0343  WBC 14.5* 11.8*  NEUTROABS 12.9*  --   HGB 10.1* 8.0*  HCT 31.6* 25.0*  MCV 83.6 83.6  PLT 346 266   Basic Metabolic Panel: Recent Labs  Lab 11/07/20 1758 11/07/20 2343 11/08/20 0343 11/08/20 0726  NA 121* 126* 131* 129*  K 3.4* 3.0* 2.9* 2.7*  CL 82* 93* 96* 95*  CO2 25 27 26 25   GLUCOSE 619* 413* 183* 168*  BUN 16 17 14 13   CREATININE 1.63* 1.46* 1.23 1.34*  CALCIUM 8.9 8.3* 7.9* 7.8*   GFR: Estimated Creatinine Clearance:  84.2 mL/min (A) (by C-G formula based on SCr of 1.34 mg/dL (H)). Liver Function Tests: Recent Labs  Lab 11/07/20 1758  AST 25  ALT 17  ALKPHOS 134*  BILITOT 1.1  PROT 8.5*  ALBUMIN 2.7*   No results for input(s): LIPASE, AMYLASE in the last 168 hours. No results for input(s): AMMONIA in the last 168 hours. Coagulation Profile: Recent Labs  Lab 11/07/20 1758  INR 1.2   Cardiac Enzymes: No results for input(s): CKTOTAL, CKMB, CKMBINDEX, TROPONINI in the last 168 hours. BNP (last 3 results) No results for input(s): PROBNP in the last 8760 hours. HbA1C: No results for input(s): HGBA1C in the last 72 hours. CBG: Recent Labs  Lab 11/08/20 0151 11/08/20 0255 11/08/20 0357 11/08/20 0602 11/08/20 0721  GLUCAP 228* 184* 176* 171* 162*   Lipid Profile: No results for input(s): CHOL, HDL, LDLCALC, TRIG, CHOLHDL, LDLDIRECT in the last 72 hours. Thyroid Function Tests: No results for input(s): TSH, T4TOTAL, FREET4, T3FREE, THYROIDAB in the last 72 hours. Anemia Panel: No results for input(s): VITAMINB12, FOLATE, FERRITIN, TIBC, IRON, RETICCTPCT in the last 72 hours. Sepsis Labs: Recent Labs  Lab 11/07/20 1758 11/07/20 2343  LATICACIDVEN 1.8 2.8*    Recent Results (from the past 240 hour(s))  Blood Culture (routine x 2)     Status: None (Preliminary result)   Collection Time: 11/07/20  5:58 PM   Specimen: BLOOD  Result Value Ref Range Status   Specimen Description BLOOD RIGHT ANTECUBITAL  Final   Special Requests   Final    BOTTLES DRAWN AEROBIC AND ANAEROBIC Blood Culture results may not be optimal due to an excessive volume of blood received in culture bottles   Culture   Final    NO GROWTH < 12 HOURS Performed at [facility], [address], [place], [address] [place]    Report Status PENDING  Incomplete  Blood Culture (routine x 2)     Status: None (Preliminary result)   Collection Time: 11/07/20  5:58 PM   Specimen: BLOOD  Result Value Ref Range  Status   Specimen Description BLOOD BLOOD LEFT FOREARM  Final   Special Requests   Final    BOTTLES DRAWN AEROBIC AND ANAEROBIC Blood Culture adequate volume   Culture   Final    NO GROWTH < 12 HOURS Performed at [facility], [address] [place], [address] [place]    Report Status PENDING  Incomplete  Resp Panel by RT-PCR (Flu A&B, Covid) Nasopharyngeal Swab     Status: None   Collection Time: 11/07/20  5:58 PM   Specimen: Nasopharyngeal Swab; Nasopharyngeal(NP) swabs in vial transport medium  Result Value Ref Range Status   SARS Coronavirus 2 by RT PCR NEGATIVE NEGATIVE Final  Comment: (NOTE) SARS-CoV-2 target nucleic acids are NOT DETECTED.  The SARS-CoV-2 RNA is generally detectable in upper respiratory specimens during the acute phase of infection. The lowest concentration of SARS-CoV-2 viral copies this assay can detect is 138 copies/mL. A negative result does not preclude SARS-Cov-2 infection and should not be used as the sole basis for treatment or other patient management decisions. A negative result may occur with  improper specimen collection/handling, submission of specimen other than nasopharyngeal swab, presence of viral mutation(s) within the areas targeted by this assay, and inadequate number of viral copies(<138 copies/mL). A negative result must be combined with clinical observations, patient history, and epidemiological information. The expected result is Negative.  Fact Sheet for Patients:  BloggerCourse.com  Fact Sheet for Healthcare Providers:  SeriousBroker.it  This test is no t yet approved or cleared by the [place] FDA and  has been authorized for detection and/or diagnosis of SARS-CoV-2 by FDA under an Emergency Use Authorization (EUA). This EUA will remain  in effect (meaning this test can be used) for the duration of the COVID-19 declaration under Section 564(b)(1) of the Act,  21 U.S.C.section 360bbb-3(b)(1), unless the authorization is terminated  or revoked sooner.       Influenza A by PCR NEGATIVE NEGATIVE Final   Influenza B by PCR NEGATIVE NEGATIVE Final    Comment: (NOTE) The Xpert Xpress SARS-CoV-2/FLU/RSV plus assay is intended as an aid in the diagnosis of influenza from Nasopharyngeal swab specimens and should not be used as a sole basis for treatment. Nasal washings and aspirates are unacceptable for Xpert Xpress SARS-CoV-2/FLU/RSV testing.  Fact Sheet for Patients: BloggerCourse.com  Fact Sheet for Healthcare Providers: SeriousBroker.it  This test is not yet approved or cleared by the [place] FDA and has been authorized for detection and/or diagnosis of SARS-CoV-2 by FDA under an Emergency Use Authorization (EUA). This EUA will remain in effect (meaning this test can be used) for the duration of the COVID-19 declaration under Section 564(b)(1) of the Act, 21 U.S.C. section 360bbb-3(b)(1), unless the authorization is terminated or revoked.  Performed at [facility], [address] [place], [place] [address]   MRSA PCR Screening     Status: None   Collection Time: 11/07/20  8:03 PM   Specimen: Nasopharyngeal  Result Value Ref Range Status   MRSA by PCR NEGATIVE NEGATIVE Final    Comment:        The GeneXpert MRSA Assay (FDA approved for NASAL specimens only), is one component of a comprehensive MRSA colonization surveillance program. It is not intended to diagnose MRSA infection nor to guide or monitor treatment for MRSA infections. Performed at [facility], [address] [place], [place] [address]   Anaerobic culture     Status: None (Preliminary result)   Collection Time: 11/07/20  8:13 PM   Specimen: PATH Other  Result Value Ref Range Status   Specimen Description   Final    TOE LEFT Performed at [facility], [address]  [place], [place] [address]    Special Requests   Final    BONE Performed at [facility], [address] [place], [place] [address]    Gram Stain PENDING  Incomplete   Culture PENDING  Incomplete   Report Status PENDING  Incomplete  Aerobic Culture (superficial specimen)     Status: None (Preliminary result)   Collection Time: 11/07/20  8:13 PM   Specimen: PATH Other  Result Value Ref Range Status  Specimen Description   Final    ABSCESS Performed at [facility], [address], [place], [place] [address]    Special Requests   Final    LEFT FOOT BONE Performed at [facility], [address] [place], [place] [address]    Gram Stain PENDING  Incomplete   Culture PENDING  Incomplete   Report Status PENDING  Incomplete         Radiology Studies: DG Foot 2 Views Left  Result Date: 11/07/2020 CLINICAL DATA:  44 year old male with left foot infection. EXAM: LEFT FOOT - 2 VIEW COMPARISON:  None. FINDINGS: There is no acute fracture or dislocation. There is mild osteopenia and mild hallux valgus. No periosteal elevation or bone erosion to suggest osteomyelitis. There is diffuse subcutaneous gas along the plantar foot extending into the third toe. There is diffuse soft tissue swelling and subcutaneous edema. IMPRESSION: 1. No acute fracture or dislocation. 2. Diffuse subcutaneous gas along the plantar foot extending into the third toe. Findings concerning for necrotizing fasciitis. Electronically Signed   By: Elgie Collard M.D.   On: 11/07/2020 18:25        Scheduled Meds: . atorvastatin  40 mg Oral Daily  . busPIRone  15 mg Oral BID  . Chlorhexidine Gluconate Cloth  6 each Topical Daily  . dicyclomine  10 mg Oral TID AC & HS  . enoxaparin (LOVENOX) injection  50 mg Subcutaneous Daily  . famotidine  20 mg Oral BID  . ferrous sulfate  325 mg Oral BID WC  . gabapentin  600 mg Oral QHS  . insulin aspart      . insulin aspart  10 Units Intravenous Once   . metoCLOPramide  10 mg Oral TID AC  . multivitamin with minerals  1 tablet Oral Daily  . pantoprazole  40 mg Oral Daily  . potassium chloride  40 mEq Oral BID  . sertraline  100 mg Oral Daily  . vitamin B-12  500 mcg Oral Daily   Continuous Infusions: . sodium chloride Stopped (11/08/20 0153)  . clindamycin (CLEOCIN) IV 600 mg (11/08/20 0636)  . dextrose 5% lactated ringers 125 mL/hr at 11/08/20 0156  . insulin 3.6 Units/hr (11/08/20 0724)  . lactated ringers    . lactated ringers 150 mL/hr at 11/07/20 1831  . lactated ringers    . piperacillin-tazobactam    . piperacillin-tazobactam (ZOSYN)  IV    . vancomycin    . vancomycin       LOS: 1 day    Time spent: 35 mins     Charise Killian, MD [facility] Pager [phone]  If 7PM-7AM, please contact night-coverage 11/08/2020, 8:32 AM

## 2020-11-08 NOTE — Progress Notes (Addendum)
Inpatient Diabetes Program Recommendations  AACE/ADA: New Consensus Statement on Inpatient Glycemic Control (2015)  Target Ranges:  Prepandial:   less than 140 mg/dL      Peak postprandial:   less than 180 mg/dL (1-2 hours)      Critically ill patients:  140 - 180 mg/dL   Results for Richard Mendoza, Richard Mendoza (MRN [id number]) as of 11/08/2020 07:14  Ref. Range 11/07/2020 21:15 11/07/2020 22:00 11/07/2020 22:46 11/07/2020 23:26 11/08/2020 00:44 11/08/2020 01:51 11/08/2020 02:55 11/08/2020 03:57 11/08/2020 06:02  Glucose-Capillary Latest Ref Range: 70 - 99 mg/dL 498 (H)  15 units NOVOLOG  444 (H) 406 (H)  IV Insulin Drip Started 375 (H) 287 (H)  IV Insulin Drip 228 (H)  IV Insulin Drip 184 (H)  IV Insulin Drip 176 (H) 171 (H)  IV Insulin Drip    Admit with: Gas gangrene left 3rd toe and foot/ Nonketotic hyperosmolar hyperglycemia   History: DM, CKD  Home DM Meds: Insulin Pump       Actos 30 mg Daily  Current Orders: IV Insulin Drip    Endocrinologist: Lalla Brothers, FNP with [facility]--last seen 08/31/2020 Basal rates= 3.6 units/hr Total Basal per 24 hour period= 86.4 units Carbohydrate Ratio= 1 unit for every 4 grams Carbohydrates Correction Factor= 1 unit for every 20 mg/dl above Target CBG    MD- Note 3:43am BMET shows the following: Glucose 183 Anion Gap= 9 CO2 level= 26  Does not have insulin pump in the hospital--Pump was sent home with wife  When you allow pt to transition to SQ Insulin, recommend the following (pt will need a fair amount of insulin as he gets about 86 units insulin for a basal rate on his insulin pump every 24 hours):  Lantus 70 units Daily (make sure to continue the IV Insulin drip for at least 1 hour after the Lantus is given) This dose of Lantus would be about 80% of his home basal rate on his pump  Novolog Resistant Correction Scale/ SSI (0-20 units) TID AC + HS  Novolog 6 units TID with meals for meal coverage    Addendum  11:15am--Met w/ pt and wife at bedside today.  Insulin Pump at home.  Pt nor wife can recall exact doses on insulin pump.  Explained to pt and wife why we have pt on IV Insulin drip, importance of good CBG control post-op for healing, transition plan to Lantus/Levemir and Novolog.  Wife stated the ENDO MD recently switched pt to Fiasp insulin in his insulin pump at home [name] is an ultra-rapid acting insulin).  Explained to pt and wife that we do not have Fiasp in the hospital but we can give Novolog and long-acting insulin as a substitute while pt is off his insulin pump.  Also explained to pt's wife that when pt goes home and resumes his insulin pump, they will need to know when the last dose of Lantus/Levemir was given so that pt can know when he can safely resume the basal rates on his pump to prevent overlap of the long-acting insulin and the basal rates on his pump.    --Will follow patient during hospitalization--  Wyn Quaker RN, MSN, CDE Diabetes Coordinator Inpatient Glycemic Control Team Team Pager: [phone] (8a-5p)

## 2020-11-08 NOTE — Consult Note (Signed)
NAMEEula Mendoza  DOB: 15-May-1976  MRN: [id number]  Date/Time: 11/08/2020 6:45 PM  REQUESTING PROVIDER: Dr.Williams Subjective:  REASON FOR CONSULT: Group B strep bacteremia ?Patient well-known to me from previous admissions and follow-up. Richard Mendoza is a 44 y.o. 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 Patient says he was doing very well until 2 weeks ago when suddenly he developed a blister on the plantar aspect of the foot which started to drain. The foot also started to get red and swollen. He went and saw Dr. Asa Lente his podiatrist on 10/19/2020 . Dr. Vickki Muff took culture and put him on doxycycline. He did not have any fever or chills at that time. He returned to see Dr. Vickki Muff on 10/27/2020 and as the culture had grown group B streptococcus and Citrobacter both are susceptible to doxycycline it was continued. The culture also had Acinetobacter. Dr. Vickki Muff also ordered a x-ray foot which was negative for acute fracture o. There was no evidence of erosive changes indicative of osteomyelitis in the bones. Osteoarthritic changes of the interphalangeal joint and first MTPJ of the great toe were noted. He went back for a follow-up on 11/03/2020    And was noted to have obvious necrosis in the third toe. With obvious purulence to the plantar aspect. He was seen by Dr. Caryl Comes and culture was taken and he was asked to go to the emergency department immediately for IV antibiotics, MRI and surgery. Patient could not come to the ED on that day because he had family business and led to go out of town. He came to the ED on 11/07/2020 at 5:30 PM. Vitals were temperature of 101.2,, BP of 129/81, heart  rate of 104,  respiratory rate of 20, pulse ox of 97 %. Labs revealed a WBC of 14.5, Hb of 10.1 and platelet of 346 and creatinine of 1.63. Blood sugar was 619 with sodium of 121, potassium 3.4 and anion gap of 14. He was treated treated for hyperosmolar hyperglycemia. And started on insulin drip and IV fluids was sent to stepdown. He was started on Vanco and Zosyn after  blood cultures were sent. X-ray foot showed diffuse subcutaneous gas along the plantar foot extending to the third toe. Findings consistent with necrotizing fasciitis. At 9 PM last night he was taken for surgery and underwent amputation of the third toe with partial ray resection. Also had I&D of multiple sites of the left foot subfascial plane. Cultures were sent. Blood culture came back positive for group B streptococcus and I am seeing the patient for the same. Patient says he has severe low back pain. He feels the pain moving from the l foot up his leg. He underwent a CT lumbar spine with contrast which did not show any infection but had multilevel bulging discs. Past Medical History:  Diagnosis Date  . DIABETES MELLITUS, TYPE II, UNCONTROLLED 03/17/2009  . DM 12/08/2008  . HYPERLIPIDEMIA 03/17/2009  . HYPERTENSION 12/08/2008  . 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 AMPUTATION Left 11/07/2020   Procedure: 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;  Surgeon: Sharlotte Alamo, DPM;  Location: [facility];  Service: Podiatry;  Laterality: Left;  .  ANTERIOR CERVICAL DECOMP/DISCECTOMY FUSION N/A 09/09/2017   Procedure: ANTERIOR CERVICAL DECOMPRESSION/DISCECTOMY FUSION CERVICAL 6- CERVICAL 7;  Surgeon: Ashok Pall, MD;  Location: [place];  Service: Neurosurgery;  Laterality: N/A;  ANTERIOR CERVICAL DECOMPRESSION/DISCECTOMY FUSION CERVICAL 6- CERVICAL 7  . APPENDECTOMY    . I & D EXTREMITY Right 10/03/2017   Procedure: IRRIGATION AND DEBRIDEMENT RIGHT WRIST;  Surgeon: Leanora Cover, MD;   Location: [place];  Service: Orthopedics;  Laterality: Right;  . I & D EXTREMITY Right 11/26/2018   Procedure: IRRIGATION AND DEBRIDEMENT FASCIA ON RIGHT FOOT;  Surgeon: Samara Deist, DPM;  Location: [facility];  Service: Podiatry;  Laterality: Right;  . 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 INCISION AND DRAINAGE Left 11/07/2020   Procedure: INCISION AND DRAINAGE;  Surgeon: Sharlotte Alamo, DPM;  Location: [facility];  Service: Podiatry;  Laterality: Left;  . METATARSAL HEAD EXCISION Right 05/15/2019   Procedure: OSTECTOMY;MET HEAD 5;  Surgeon: Samara Deist, DPM;  Location: [facility];  Service: Podiatry;  Laterality: Right;  . osteomylitis    . ROTATOR CUFF REPAIR Left     Social History   Socioeconomic History  . Marital status: Married    Spouse name: Not on file  . Number of children: 4  . Years of education: Not on file  . Highest education level: Not on file  Occupational History  . Not on file  Tobacco Use  . Smoking status: Former Smoker    Packs/day: 1.00    Years: 17.00    Pack years: 17.00    Types: Cigarettes    Quit date: 01/14/2019    Years since quitting: 1.8  . Smokeless tobacco: Never Used  Vaping Use  . Vaping Use: Never used  Substance and Sexual Activity  . Alcohol use: Yes    Comment: rare  . Drug use: No  . Sexual activity: Yes  Other Topics Concern  . Not on file  Social History Narrative  . Not on file   Social Determinants of Health   Financial Resource Strain: Not on file  Food Insecurity: Not on file  Transportation Needs: Not on file  Physical Activity: Not on file  Stress: Not on file  Social Connections: Not on file  Intimate Partner Violence: Not on file    Family History  Problem Relation Age of Onset  . Diabetes Mother   . Heart disease Father   . Diabetes Father   .  Arthritis Other   . Hyperlipidemia Other   . Hypertension Other   . Cancer Other        breast  . Mental illness Neg Hx    No Known Allergies  ? Current Facility-Administered Medications  Medication Dose Route Frequency Provider Last Rate Last Admin  . 0.9 %  sodium chloride infusion   Intravenous Continuous Wyvonnia Dusky, MD 100 mL/hr at 11/08/20 1356 Rate Change at 11/08/20 1356  . acetaminophen (TYLENOL) tablet 650 mg  650 mg Oral Q6H PRN Sharlotte Alamo, DPM       Or  . acetaminophen (TYLENOL) suppository 650 mg  650 mg Rectal Q6H PRN Sharlotte Alamo, DPM      . Ampicillin-Sulbactam (UNASYN) 3 g in sodium chloride 0.9 % 100 mL IVPB  3 g Intravenous Q6H Raeshawn Vo, MD 200 mL/hr at 11/08/20 1800 3 g at 11/08/20 1800  .  atorvastatin (LIPITOR) tablet 40 mg  40 mg Oral Daily Sharlotte Alamo, DPM   40 mg at 11/08/20 0911  . busPIRone (BUSPAR) tablet 15 mg  15 mg Oral BID Sharlotte Alamo, DPM   15 mg at 11/08/20 0912  . Chlorhexidine Gluconate Cloth 2 % PADS 6 each  6 each Topical Daily Sharlotte Alamo, DPM   6 each at 11/08/20 [phone]  . dextrose 5 % in lactated ringers infusion   Intravenous Continuous Sharlotte Alamo, DPM   Stopped at 11/08/20 1400  . dextrose 50 % solution 0-50 mL  0-50 mL Intravenous PRN Sharlotte Alamo, DPM      . dicyclomine (BENTYL) capsule 10 mg  10 mg Oral TID AC & HS Sharlotte Alamo, DPM   10 mg at 11/08/20 0910  . enoxaparin (LOVENOX) injection 50 mg  50 mg Subcutaneous Daily Sharlotte Alamo, DPM      . famotidine (PEPCID) tablet 20 mg  20 mg Oral BID Sharlotte Alamo, DPM   20 mg at 11/08/20 0911  . ferrous sulfate tablet 325 mg  325 mg Oral BID WC Sharlotte Alamo, DPM   325 mg at 11/08/20 1652  . gabapentin (NEURONTIN) capsule 600 mg  600 mg Oral QHS Sharlotte Alamo, DPM      . HYDROmorphone (DILAUDID) injection 1 mg  1 mg Intravenous Q4H PRN Wyvonnia Dusky, MD   1 mg at 11/08/20 1552  . insulin aspart (novoLOG) injection 0-20 Units  0-20 Units Subcutaneous TID AC & HS Wyvonnia Dusky,  MD   7 Units at 11/08/20 1648  . insulin aspart (novoLOG) injection 10 Units  10 Units Intravenous Once Sharlotte Alamo, DPM      . insulin aspart (novoLOG) injection 6 Units  6 Units Subcutaneous TID WC Wyvonnia Dusky, MD   6 Units at 11/08/20 1648  . insulin glargine (LANTUS) injection 70 Units  70 Units Subcutaneous Daily Wyvonnia Dusky, MD   70 Units at 11/08/20 1354  . lactated ringers bolus 2,042 mL  20 mL/kg Intravenous Once Sharlotte Alamo, DPM      . lactated ringers infusion   Intravenous Continuous Sharlotte Alamo, DPM      . linaclotide [facility]) capsule 72 mcg  72 mcg Oral Daily PRN Sharlotte Alamo, DPM      . magnesium hydroxide (MILK OF MAGNESIA) suspension 30 mL  30 mL Oral Daily PRN Sharlotte Alamo, DPM      . metoCLOPramide (REGLAN) tablet 10 mg  10 mg Oral TID Cynda Acres, DPM   10 mg at 11/08/20 0910  . morphine 2 MG/ML injection 2 mg  2 mg Intravenous Q2H PRN Sharlotte Alamo, DPM      . multivitamin with minerals tablet 1 tablet  1 tablet Oral Daily Sharlotte Alamo, DPM   1 tablet at 11/08/20 0911  . ondansetron (ZOFRAN) tablet 4 mg  4 mg Oral Q6H PRN Sharlotte Alamo, DPM       Or  . ondansetron [facility]) injection 4 mg  4 mg Intravenous Q6H PRN Sharlotte Alamo, DPM      . oxyCODONE-acetaminophen (PERCOCET/ROXICET) 5-325 MG per tablet 1-2 tablet  1-2 tablet Oral Q4H PRN Sharlotte Alamo, DPM      . pantoprazole (PROTONIX) EC tablet 40 mg  40 mg Oral Daily Sharlotte Alamo, DPM      . potassium chloride SA (KLOR-CON) CR tablet 40 mEq  40 mEq Oral BID Wyvonnia Dusky, MD   40 mEq at 11/08/20 0914  . senna (SENOKOT) tablet  8.6 mg  1 tablet Oral BID PRN Sharlotte Alamo, DPM      . sertraline (ZOLOFT) tablet 100 mg  100 mg Oral Daily Sharlotte Alamo, DPM   100 mg at 11/08/20 0911  . traZODone (DESYREL) tablet 25 mg  25 mg Oral QHS PRN Sharlotte Alamo, DPM      . vancomycin (VANCOREADY) IVPB 750 mg/150 mL  750 mg Intravenous Q12H Berton Mount, RPH 150 mL/hr at 11/08/20 1552 750 mg at 11/08/20 1552  . vitamin B-12  (CYANOCOBALAMIN) tablet 500 mcg  500 mcg Oral Daily Sharlotte Alamo, DPM   500 mcg at 11/08/20 8502     Abtx:  Anti-infectives (From admission, onward)   Start     Dose/Rate Route Frequency Ordered Stop   11/08/20 1800  vancomycin (VANCOREADY) IVPB 1250 mg/250 mL  Status:  Discontinued        1,250 mg 166.7 mL/hr over 90 Minutes Intravenous Every 24 hours 11/07/20 1939 11/08/20 1258   11/08/20 1800  Ampicillin-Sulbactam (UNASYN) 3 g in sodium chloride 0.9 % 100 mL IVPB        3 g 200 mL/hr over 30 Minutes Intravenous Every 6 hours 11/08/20 1636     11/08/20 1400  vancomycin (VANCOREADY) IVPB 750 mg/150 mL        750 mg 150 mL/hr over 60 Minutes Intravenous Every 12 hours 11/08/20 1258     11/08/20 0800  piperacillin-tazobactam (ZOSYN) IVPB 3.375 g  Status:  Discontinued        3.375 g 12.5 mL/hr over 240 Minutes Intravenous Every 8 hours 11/07/20 1939 11/08/20 1636   11/07/20 2200  clindamycin (CLEOCIN) IVPB 600 mg  Status:  Discontinued        600 mg 100 mL/hr over 30 Minutes Intravenous Every 8 hours 11/07/20 1924 11/08/20 0834   11/07/20 1930  vancomycin (VANCOCIN) IVPB 1000 mg/200 mL premix  Status:  Discontinued        1,000 mg 200 mL/hr over 60 Minutes Intravenous  Once 11/07/20 1924 11/07/20 2244   11/07/20 1930  piperacillin-tazobactam (ZOSYN) IVPB 3.375 g  Status:  Discontinued        3.375 g 100 mL/hr over 30 Minutes Intravenous  Once 11/07/20 1924 11/08/20 1636   11/07/20 1815  vancomycin (VANCOREADY) IVPB 2000 mg/400 mL  Status:  Discontinued        2,000 mg 200 mL/hr over 120 Minutes Intravenous STAT 11/07/20 1801 11/07/20 1802   11/07/20 1815  vancomycin (VANCOCIN) IVPB 1000 mg/200 mL premix  Status:  Discontinued        1,000 mg 200 mL/hr over 60 Minutes Intravenous STAT 11/07/20 1802 11/08/20 1258   11/07/20 1800  ceFEPIme (MAXIPIME) 2 g in sodium chloride 0.9 % 100 mL IVPB        2 g 200 mL/hr over 30 Minutes Intravenous STAT 11/07/20 1757 11/07/20 2303      REVIEW  OF SYSTEMS:  Const:  fever,  chills, negative weight loss Eyes: negative diplopia or visual changes, negative eye pain ENT: negative coryza, negative sore throat Resp: negative cough, hemoptysis, dyspnea Cards: negative for chest pain, palpitations, lower extremity edema GU: Has some difficulty in initiating micturition and also has a thin stream.  GI: Had several episodes of nausea vomiting and diarrhea today and yesterday Skin: negative for rash and pruritus Heme: negative for easy bruising and gum/nose bleeding MS: General weakness Neurolo:negative for headaches, dizziness, vertigo, memory problems  Psych:  anxiety, depression  Endocrine: Poorly controlled diabetes. Says hemoglobin  A1c is 11. He  started on insulin pump recently.   Allergy/Immunology- negative for any medication or food allergies ?  Objective:  VITALS:  BP 129/83   Pulse 86   Temp 100 F (37.8 C) (Oral)   Resp (!) 21   Ht 5' 10"  (1.778 m)   Wt 102.1 kg   SpO2 91%   BMI 32.28 kg/m  PHYSICAL EXAM:  General: Awake cooperative, no distress, appears stated age. Weak Head: Normocephalic, without obvious abnormality, atraumatic. Eyes: Conjunctivae clear, anicteric sclerae. Pupils are equal ENT Nares normal. No drainage or sinus tenderness. Lips, mucosa, and tongue normal. No Thrush Edentulous Neck: Supple, symmetrical, no adenopathy, thyroid: non tender no carotid bruit and no JVD. Back: Did not examine Lungs: Bilateral air entry Heart: S1-S2. Tachycardia Abdomen: Soft, non-tender,not distended. Bowel sounds normal. No masses Extremities: Right foot amputated fourth toe and fifth metatarsal head. Healed very well Left foot     Skin: No rashes or lesions. Or bruising Lymph: Cervical, supraclavicular normal. Neurologic: Grossly non-focal Pertinent Labs Lab Results CBC CBC Latest Ref Rng & Units 11/08/2020 11/08/2020 11/07/2020  WBC 4.0 - 10.5 K/uL 12.3(H) 11.8(H) 14.5(H)  Hemoglobin 13.0 - 17.0 g/dL  7.9(L) 8.0(L) 10.1(L)  Hematocrit 39.0 - 52.0 % 24.9(L) 25.0(L) 31.6(L)  Platelets 150 - 400 K/uL 250 266 346    CMP Latest Ref Rng & Units 11/08/2020 11/08/2020 11/08/2020  Glucose 70 - 99 mg/dL 167(H) 168(H) 183(H)  BUN 6 - 20 mg/dL 12 13 14   Creatinine 0.61 - 1.24 mg/dL 1.35(H) 1.34(H) 1.23  Sodium 135 - 145 mmol/L 130(L) 129(L) 131(L)  Potassium 3.5 - 5.1 mmol/L 2.9(L) 2.7(LL) 2.9(L)  Chloride 98 - 111 mmol/L 96(L) 95(L) 96(L)  CO2 22 - 32 mmol/L 25 25 26   Calcium 8.9 - 10.3 mg/dL 7.9(L) 7.8(L) 7.9(L)  Total Protein 6.5 - 8.1 g/dL - - -  Total Bilirubin 0.3 - 1.2 mg/dL - - -  Alkaline Phos 38 - 126 U/L - - -  AST 15 - 41 U/L - - -  ALT 0 - 44 U/L - - -     Microbiology: Recent Results (from the past 240 hour(s))  Blood Culture (routine x 2)     Status: None (Preliminary result)   Collection Time: 11/07/20  5:58 PM   Specimen: BLOOD  Result Value Ref Range Status   Specimen Description BLOOD RIGHT ANTECUBITAL  Final   Special Requests   Final    BOTTLES DRAWN AEROBIC AND ANAEROBIC Blood Culture results may not be optimal due to an excessive volume of blood received in culture bottles   Culture   Final    NO GROWTH < 12 HOURS Performed at [facility], [address] [place], [place] [address]    Report Status PENDING  Incomplete  Blood Culture (routine x 2)     Status: None (Preliminary result)   Collection Time: 11/07/20  5:58 PM   Specimen: BLOOD  Result Value Ref Range Status   Specimen Description BLOOD BLOOD LEFT FOREARM  Final   Special Requests   Final    BOTTLES DRAWN AEROBIC AND ANAEROBIC Blood Culture adequate volume   Culture  Setup Time   Final    Organism ID to follow GRAM POSITIVE COCCI IN BOTH AEROBIC AND ANAEROBIC BOTTLES CRITICAL RESULT CALLED TO, READ BACK BY AND VERIFIED WITH: Laqueta Carina AT 6659 12/21/821 [place] Performed at [facility], [address], [place],  [address]    Culture [place]  Final    Report Status PENDING  Incomplete  Resp Panel by RT-PCR (Flu A&B, Covid) Nasopharyngeal Swab     Status: None   Collection Time: 11/07/20  5:58 PM   Specimen: Nasopharyngeal Swab; Nasopharyngeal(NP) swabs in vial transport medium  Result Value Ref Range Status   SARS Coronavirus 2 by RT PCR NEGATIVE NEGATIVE Final    Comment: (NOTE) SARS-CoV-2 target nucleic acids are NOT DETECTED.  The SARS-CoV-2 RNA is generally detectable in upper respiratory specimens during the acute phase of infection. The lowest concentration of SARS-CoV-2 viral copies this assay can detect is 138 copies/mL. A negative result does not preclude SARS-Cov-2 infection and should not be used as the sole basis for treatment or other patient management decisions. A negative result may occur with  improper specimen collection/handling, submission of specimen other than nasopharyngeal swab, presence of viral mutation(s) within the areas targeted by this assay, and inadequate number of viral copies(<138 copies/mL). A negative result must be combined with clinical observations, patient history, and epidemiological information. The expected result is Negative.  Fact Sheet for Patients:  EntrepreneurPulse.com.au  Fact Sheet for Healthcare Providers:  IncredibleEmployment.be  This test is no t yet approved or cleared by the [place] FDA and  has been authorized for detection and/or diagnosis of SARS-CoV-2 by FDA under an Emergency Use Authorization (EUA). This EUA will remain  in effect (meaning this test can be used) for the duration of the COVID-19 declaration under Section 564(b)(1) of the Act, 21 U.S.C.section 360bbb-3(b)(1), unless the authorization is terminated  or revoked sooner.       Influenza A by PCR NEGATIVE NEGATIVE Final   Influenza B by PCR NEGATIVE NEGATIVE Final    Comment: (NOTE) The Xpert Xpress SARS-CoV-2/FLU/RSV plus assay is intended as an aid in the  diagnosis of influenza from Nasopharyngeal swab specimens and should not be used as a sole basis for treatment. Nasal washings and aspirates are unacceptable for Xpert Xpress SARS-CoV-2/FLU/RSV testing.  Fact Sheet for Patients: EntrepreneurPulse.com.au  Fact Sheet for Healthcare Providers: IncredibleEmployment.be  This test is not yet approved or cleared by the [place] FDA and has been authorized for detection and/or diagnosis of SARS-CoV-2 by FDA under an Emergency Use Authorization (EUA). This EUA will remain in effect (meaning this test can be used) for the duration of the COVID-19 declaration under Section 564(b)(1) of the Act, 21 U.S.C. section 360bbb-3(b)(1), unless the authorization is terminated or revoked.  Performed at [facility], [address] [place], [place] [address]   Blood Culture ID Panel (Reflexed)     Status: Abnormal   Collection Time: 11/07/20  5:58 PM  Result Value Ref Range Status   Enterococcus faecalis NOT DETECTED NOT DETECTED Final   Enterococcus Faecium NOT DETECTED NOT DETECTED Final   Listeria monocytogenes NOT DETECTED NOT DETECTED Final   Staphylococcus species NOT DETECTED NOT DETECTED Final   Staphylococcus aureus (BCID) NOT DETECTED NOT DETECTED Final   Staphylococcus epidermidis NOT DETECTED NOT DETECTED Final   Staphylococcus lugdunensis NOT DETECTED NOT DETECTED Final   Streptococcus species DETECTED (A) NOT DETECTED Final    Comment: CRITICAL RESULT CALLED TO, READ BACK BY AND VERIFIED WITH:  SUSAN WATSON AT 1307 11/08/20 SDR    Streptococcus agalactiae DETECTED (A) NOT DETECTED Final    Comment: CRITICAL RESULT CALLED TO, READ BACK BY AND VERIFIED WITH:  SUSAN WATSON AT 4403 11/08/20 SDR    Streptococcus pneumoniae NOT DETECTED NOT DETECTED Final   Streptococcus pyogenes NOT  DETECTED NOT DETECTED Final   A.calcoaceticus-baumannii NOT DETECTED NOT DETECTED Final   Bacteroides  fragilis NOT DETECTED NOT DETECTED Final   Enterobacterales NOT DETECTED NOT DETECTED Final   Enterobacter cloacae complex NOT DETECTED NOT DETECTED Final   Escherichia coli NOT DETECTED NOT DETECTED Final   Klebsiella aerogenes NOT DETECTED NOT DETECTED Final   Klebsiella oxytoca NOT DETECTED NOT DETECTED Final   Klebsiella pneumoniae NOT DETECTED NOT DETECTED Final   Proteus species NOT DETECTED NOT DETECTED Final   Salmonella species NOT DETECTED NOT DETECTED Final   Serratia marcescens NOT DETECTED NOT DETECTED Final   Haemophilus influenzae NOT DETECTED NOT DETECTED Final   Neisseria meningitidis NOT DETECTED NOT DETECTED Final   Pseudomonas aeruginosa NOT DETECTED NOT DETECTED Final   Stenotrophomonas maltophilia NOT DETECTED NOT DETECTED Final   Candida albicans NOT DETECTED NOT DETECTED Final   Candida auris NOT DETECTED NOT DETECTED Final   Candida glabrata NOT DETECTED NOT DETECTED Final   Candida krusei NOT DETECTED NOT DETECTED Final   Candida parapsilosis NOT DETECTED NOT DETECTED Final   Candida tropicalis NOT DETECTED NOT DETECTED Final   Cryptococcus neoformans/gattii NOT DETECTED NOT DETECTED Final    Comment: Performed at [facility], [address] [place], [place] [address]  MRSA PCR Screening     Status: None   Collection Time: 11/07/20  8:03 PM   Specimen: Nasopharyngeal  Result Value Ref Range Status   MRSA by PCR NEGATIVE NEGATIVE Final    Comment:        The GeneXpert MRSA Assay (FDA approved for NASAL specimens only), is one component of a comprehensive MRSA colonization surveillance program. It is not intended to diagnose MRSA infection nor to guide or monitor treatment for MRSA infections. Performed at [facility], [address] [place], [place] [address]   Anaerobic culture     Status: None (Preliminary result)   Collection Time: 11/07/20  8:13 PM   Specimen: PATH Other  Result Value Ref Range Status   Specimen  Description   Final    TOE LEFT Performed at [facility], [address] [place], [place] [address]    Special Requests BONE  Final   Gram Stain   Final    RARE WBC PRESENT,BOTH PMN AND MONONUCLEAR MODERATE GRAM POSITIVE COCCI IN PAIRS IN CLUSTERS Performed at [facility], [place] [address], [place], [place] [address]    Culture PENDING  Incomplete   Report Status PENDING  Incomplete  Aerobic/Anaerobic Culture (surgical/deep wound)     Status: None (Preliminary result)   Collection Time: 11/07/20  8:13 PM   Specimen: PATH Other; Abscess  Result Value Ref Range Status   Specimen Description   Final    ABSCESS Performed at [facility], [address], [place], [place] [address]    Special Requests   Final    LEFT FOOT Performed at [facility], [address] [place], [place] [address]    Gram Stain   Final    RARE WBC PRESENT,BOTH PMN AND MONONUCLEAR ABUNDANT GRAM POSITIVE COCCI IN PAIRS IN CLUSTERS Performed at [facility], [place] [address] [place], [place] [address]    Culture PENDING  Incomplete   Report Status PENDING  Incomplete  Aerobic Culture (superficial specimen)     Status: None (Preliminary result)   Collection Time: 11/07/20  8:13 PM   Specimen: PATH Other  Result Value Ref Range Status   Specimen Description  Final    ABSCESS Performed at [facility], [address] [place], [place] [address]    Special Requests LEFT FOOT BONE  Final   Gram Stain   Final    NO WBC SEEN FEW GRAM POSITIVE COCCI IN PAIRS IN CLUSTERS Performed at [facility], [place] [address] [place], [place] [address]    Culture PENDING  Incomplete   Report Status PENDING  Incomplete    IMAGING RESULTS:  I have personally reviewed the films ? 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 Status post amputation of the third toe Also underwent incision and drainage of  multiple abscesses in the plantar aspect. Cultures are pending  Group B streptococcus bacteremia Patient is currently on vancomycin and Zosyn Because of CKD we will avoid this combination. We will change the Zosyn to Unasyn. On reviewing the cultures that were taken on 10/19/2020 at Dr. Alvera Singh office it had Citrobacter, Acinetobacter and group B streptococcus. Dr. Cleda Mccreedy sent another culture on 11/03/2020 which is still pending. Until we get all the cultures back we will give cefepime and Flagyl to cover gram-negative's and anaerobes. We will continue vancomycin with a trough around 15. If there is no MRSA in the wound will discontinue vancomycin.  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 After of many months of repeated infection in 2019 in 2020 needing multiple courses of antibiotics including vancomycin and then daptomycin which was given because of CKD and also linezolid and dalbavancin that foot has healed completely and has been infection free for the past 16 months.  Diabetes mellitus poorly controlled. Presented with hyperosmolar hyperglycemia. Latest hemoglobin A1c was 11. He was recently switched to a continuous insulin pump.  Lower back pain. The recent CT does not show any infection but degenerative disc disease and some bulging discs  CKD. Patient's baseline creatinine is like around 1.60 since June 2020. Monitor closely as he contrast today. Low threshold for stopping vancomycin. ?  Anemia. Patient has some difficulty in maintaining the urine stream. We will get post void bladder scan to look for any residue  Discussed the management in great detail with patient his wife and podiatrist. ID will follow him remotely tomorrow. Call if needed. [phone] ? __________r Note:  This document was prepared using Dragon voice recognition software and may include unintentional dictation errors.

## 2020-11-08 NOTE — Progress Notes (Signed)
1 Day Post-Op   Subjective/Chief Complaint: Patient seen. Having a fair amount of pain in his left foot. About 8 out of 10 at this point. A lot of back pain as well. States he just had a CT scan performed.   Objective: Vital signs in last 24 hours: Temp:  [98.5 F (36.9 C)-101.2 F (38.4 C)] 99.9 F (37.7 C) (12/21 0738) Pulse Rate:  [84-109] 87 (12/21 1500) Resp:  [0-36] 30 (12/21 1500) BP: (112-177)/(68-100) 134/75 (12/21 1500) SpO2:  [88 %-100 %] 97 % (12/21 1500) Weight:  [102.1 kg] 102.1 kg (12/20 1752) Last BM Date: 11/04/20  Intake/Output from previous day: 12/20 0701 - 12/21 0700 In: 500 [I.V.:500] Out: 1850 [Urine:1850] Intake/Output this shift: Total I/O In: 0  Out: 525 [Urine:525]  Heavy drainage is noted on the bandaging on the left foot. Upon removal the erythema and edema has significantly improved. Dorsal incision at the third ray amputation site is well coapted and looks good. A large portion of the plantar skin appears to be undergoing early necrosis.      Lab Results:  Recent Labs    11/08/20 0343 11/08/20 1206  WBC 11.8* 12.3*  HGB 8.0* 7.9*  HCT 25.0* 24.9*  PLT 266 250   BMET Recent Labs    11/08/20 0726 11/08/20 1206  NA 129* 130*  K 2.7* 2.9*  CL 95* 96*  CO2 25 25  GLUCOSE 168* 167*  BUN 13 12  CREATININE 1.34* 1.35*  CALCIUM 7.8* 7.9*   PT/INR Recent Labs    11/07/20 1758  LABPROT 14.8  INR 1.2   ABG No results for input(s): PHART, HCO3 in the last 72 hours.  Invalid input(s): PCO2, PO2  Studies/Results: CT LUMBAR SPINE W CONTRAST  Result Date: 11/08/2020 CLINICAL DATA:  Low back pain, infection suspected. 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 Diabetes. Severe low back pain. EXAM: CT LUMBAR SPINE WITH CONTRAST TECHNIQUE: Multidetector CT imaging of the lumbar spine was performed with intravenous contrast administration. CONTRAST:  OMNIPAQUE IOHEXOL 300 MG/ML  SOLN COMPARISON:  MRI of lumbar spine 10/15/2018 FINDINGS:  Segmentation: 5 non rib-bearing lumbar type vertebral bodies are present. The lowest fully formed vertebral body is L5. Alignment: No significant listhesis is present. Lumbar lordosis is preserved. Vertebrae: Vertebral body heights maintained. No focal lytic or blastic lesions are present. Paraspinal and other soft tissues: Paraspinous soft tissues are unremarkable. No focal inflammatory change or fluid collection is present. Visualized portions the abdomen are unremarkable. Disc levels: T12-L1: Mild facet hypertrophy is present. No significant disc disease or stenosis is present. L1-2: Facet hypertrophy is present. Minimal disc bulging is present. No significant stenosis is present. L2-3: Mild broad-based disc bulge is present. Moderate facet hypertrophy is noted bilaterally. Mild foraminal narrowing is present bilaterally. L3-4: Broad-based disc protrusion is present. Moderate facet hypertrophy has progressed. This results in mild central and bilateral foraminal stenosis. L4-5: Broad-based disc protrusion is present. Moderate facet hypertrophy is noted bilaterally. Moderate central canal narrowing is worse left than right. Moderate bilateral foraminal narrowing is again seen. L5-S1: Right paramedian disc protrusion is present with calcification. Severe right subarticular stenosis is again seen. Moderate to severe right and moderate left foraminal narrowing is present. SI joints are fused bilaterally. No significant intracanalicular enhancement is evident. IMPRESSION: 1. Progressive multilevel spondylosis of the lumbar spine as described. 2. 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 3. Moderate central canal narrowing at L4-5 is worse left than right. 4. Moderate bilateral foraminal  narrowing at L4-5. 5. Progressive mild central and bilateral foraminal stenosis at L3-4. 6. Fusion of the SI joints bilaterally. 7. No focal soft tissue enhancement or fluid collection to  suggest infection. If clinical concern persists, MRI more sensitive for evaluation of soft tissues in the spinal canal and foramina. Electronically Signed   By: Marin Roberts M.D.   On: 11/08/2020 15:21   DG Foot 2 Views Left  Result Date: 11/07/2020 CLINICAL DATA:  44 year old male with left foot infection. EXAM: LEFT FOOT - 2 VIEW COMPARISON:  None. FINDINGS: There is no acute fracture or dislocation. There is mild osteopenia and mild hallux valgus. No periosteal elevation or bone erosion to suggest osteomyelitis. There is diffuse subcutaneous gas along the plantar foot extending into the third toe. There is diffuse soft tissue swelling and subcutaneous edema. IMPRESSION: 1. No acute fracture or dislocation. 2. Diffuse subcutaneous gas along the plantar foot extending into the third toe. Findings concerning for necrotizing fasciitis. Electronically Signed   By: Elgie Collard M.D.   On: 11/07/2020 18:25    Anti-infectives: Anti-infectives (From admission, onward)   Start     Dose/Rate Route Frequency Ordered Stop   11/08/20 1800  vancomycin (VANCOREADY) IVPB 1250 mg/250 mL  Status:  Discontinued        1,250 mg 166.7 mL/hr over 90 Minutes Intravenous Every 24 hours 11/07/20 1939 11/08/20 1258   11/08/20 1400  vancomycin (VANCOREADY) IVPB 750 mg/150 mL        750 mg 150 mL/hr over 60 Minutes Intravenous Every 12 hours 11/08/20 1258     11/08/20 0800  piperacillin-tazobactam (ZOSYN) IVPB 3.375 g        3.375 g 12.5 mL/hr over 240 Minutes Intravenous Every 8 hours 11/07/20 1939     11/07/20 2200  clindamycin (CLEOCIN) IVPB 600 mg  Status:  Discontinued        600 mg 100 mL/hr over 30 Minutes Intravenous Every 8 hours 11/07/20 1924 11/08/20 0834   11/07/20 1930  vancomycin (VANCOCIN) IVPB 1000 mg/200 mL premix  Status:  Discontinued        1,000 mg 200 mL/hr over 60 Minutes Intravenous  Once 11/07/20 1924 11/07/20 2244   11/07/20 1930  piperacillin-tazobactam (ZOSYN) IVPB 3.375 g         3.375 g 100 mL/hr over 30 Minutes Intravenous  Once 11/07/20 1924     11/07/20 1815  vancomycin (VANCOREADY) IVPB 2000 mg/400 mL  Status:  Discontinued        2,000 mg 200 mL/hr over 120 Minutes Intravenous STAT 11/07/20 1801 11/07/20 1802   11/07/20 1815  vancomycin (VANCOCIN) IVPB 1000 mg/200 mL premix  Status:  Discontinued        1,000 mg 200 mL/hr over 60 Minutes Intravenous STAT 11/07/20 1802 11/08/20 1258   11/07/20 1800  ceFEPIme (MAXIPIME) 2 g in sodium chloride 0.9 % 100 mL IVPB        2 g 200 mL/hr over 30 Minutes Intravenous STAT 11/07/20 1757 11/07/20 2303      Assessment/Plan: s/p Procedure(s): INCISION AND DRAINAGE (Left) 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 (Left) Assessment: Status post ray resection with I&D, condition guarded.   Plan: The wound was repacked with iodoform gauze followed by a bulky bandage. Discussed with the patient that he is still at significant risk for limb loss and we will continue to monitor this daily. Also discussed that we may need to go back to the operating room for another debridement and  washout. At this point we will allow the plantar skin to demarcate a little bit better. Plan for dressing change tomorrow.  LOS: 1 day    Ricci Barker 11/08/2020

## 2020-11-09 DIAGNOSIS — M726 Necrotizing fasciitis: Secondary | ICD-10-CM | POA: Diagnosis not present

## 2020-11-09 LAB — CBC
HCT: 24.8 % — ABNORMAL LOW (ref 39.0–52.0)
Hemoglobin: 7.8 g/dL — ABNORMAL LOW (ref 13.0–17.0)
MCH: 26.8 pg (ref 26.0–34.0)
MCHC: 31.5 g/dL (ref 30.0–36.0)
MCV: 85.2 fL (ref 80.0–100.0)
Platelets: 244 10*3/uL (ref 150–400)
RBC: 2.91 MIL/uL — ABNORMAL LOW (ref 4.22–5.81)
RDW: 14.6 % (ref 11.5–15.5)
WBC: 11.7 10*3/uL — ABNORMAL HIGH (ref 4.0–10.5)
nRBC: 0 % (ref 0.0–0.2)

## 2020-11-09 LAB — BASIC METABOLIC PANEL
Anion gap: 8 (ref 5–15)
BUN: 12 mg/dL (ref 6–20)
CO2: 26 mmol/L (ref 22–32)
Calcium: 7.6 mg/dL — ABNORMAL LOW (ref 8.9–10.3)
Chloride: 100 mmol/L (ref 98–111)
Creatinine, Ser: 1.24 mg/dL (ref 0.61–1.24)
GFR, Estimated: >60 mL/min (ref >60)
Glucose, Bld: 201 mg/dL — ABNORMAL HIGH (ref 70–99)
Potassium: 3.2 mmol/L — ABNORMAL LOW (ref 3.5–5.1)
Sodium: 134 mmol/L — ABNORMAL LOW (ref 135–145)

## 2020-11-09 LAB — SURGICAL PATHOLOGY

## 2020-11-09 LAB — GLUCOSE, CAPILLARY
Glucose-Capillary: 272 mg/dL — ABNORMAL HIGH (ref 70–99)
Glucose-Capillary: 218 mg/dL — ABNORMAL HIGH (ref 70–99)
Glucose-Capillary: 174 mg/dL — ABNORMAL HIGH (ref 70–99)

## 2020-11-09 MED ORDER — OXYCODONE HCL 5 MG PO TABS
5.0000 mg | ORAL_TABLET | ORAL | Status: DC | PRN
  Administered 2020-11-10 (×2): 5 mg via ORAL
  Filled 2020-11-09 (×3): qty 1

## 2020-11-09 MED ORDER — ACETAMINOPHEN 325 MG PO TABS
650.0000 mg | ORAL_TABLET | Freq: Four times a day (QID) | ORAL | Status: DC
  Administered 2020-11-09 – 2020-11-21 (×43): 650 mg via ORAL
  Filled 2020-11-09 (×44): qty 2

## 2020-11-09 MED ORDER — POTASSIUM CHLORIDE CRYS ER 20 MEQ PO TBCR
40.0000 meq | EXTENDED_RELEASE_TABLET | Freq: Once | ORAL | Status: AC
  Administered 2020-11-09: 40 meq via ORAL
  Filled 2020-11-09: qty 2

## 2020-11-09 MED ORDER — INSULIN ASPART 100 UNIT/ML ~~LOC~~ SOLN
10.0000 [IU] | Freq: Three times a day (TID) | SUBCUTANEOUS | Status: DC
  Administered 2020-11-09 – 2020-11-10 (×3): 10 [IU] via SUBCUTANEOUS
  Filled 2020-11-09 (×3): qty 1

## 2020-11-09 MED ORDER — INSULIN GLARGINE 100 UNIT/ML ~~LOC~~ SOLN
80.0000 [IU] | Freq: Every day | SUBCUTANEOUS | Status: DC
  Administered 2020-11-10: 80 [IU] via SUBCUTANEOUS
  Filled 2020-11-09 (×3): qty 0.8

## 2020-11-09 MED ORDER — METHOCARBAMOL 500 MG PO TABS
500.0000 mg | ORAL_TABLET | Freq: Four times a day (QID) | ORAL | Status: DC
  Administered 2020-11-09 – 2020-11-11 (×9): 500 mg via ORAL
  Filled 2020-11-09 (×10): qty 1

## 2020-11-09 MED ORDER — LIDOCAINE 5 % EX PTCH
1.0000 | MEDICATED_PATCH | CUTANEOUS | Status: DC
  Administered 2020-11-09 – 2020-11-16 (×6): 1 via TRANSDERMAL
  Filled 2020-11-09 (×14): qty 1

## 2020-11-09 MED ORDER — HYDROMORPHONE HCL 1 MG/ML IJ SOLN
1.0000 mg | INTRAMUSCULAR | Status: DC | PRN
  Administered 2020-11-09 – 2020-11-17 (×44): 1 mg via INTRAVENOUS
  Filled 2020-11-09 (×45): qty 1

## 2020-11-09 MED ORDER — SODIUM CHLORIDE 0.9 % IV SOLN
2.0000 g | Freq: Three times a day (TID) | INTRAVENOUS | Status: DC
  Administered 2020-11-09 – 2020-11-10 (×6): 2 g via INTRAVENOUS
  Filled 2020-11-09 (×8): qty 2

## 2020-11-09 MED ORDER — INSULIN GLARGINE 100 UNIT/ML ~~LOC~~ SOLN
10.0000 [IU] | Freq: Once | SUBCUTANEOUS | Status: DC
  Filled 2020-11-09: qty 0.1

## 2020-11-09 NOTE — Progress Notes (Addendum)
PROGRESS NOTE    Richard Mendoza  WYO:[id number] DOB: [birth year] DOA: 11/07/2020 PCP: Tracie Harrier, MD   Brief Narrative:   44 y.o. 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  His blood glucose levels have been significantly elevated.  He admitted to fever and chills at home.  He has been having nausea without vomiting or diarrhea or abdominal pain.  He denied any dyspnea or cough or wheezing.  He admits to polyuria and polydipsia.  He was given doxycycline by Dr. Vickki Muff and has been on it.  His left foot pain has been worsening with associated erythema, swelling and radiation to his left leg.  No chest pain or dyspnea or palpitations.  No dysuria, oliguria or hematuria or flank pain.  Upon presentation to the emergency room, temperature was 101.2 heart rate of 104 and otherwise normal vital signs.  Labs revealed hyponatremia 121 with hypochloremia of 82, hypokalemia 3.4 and significant hyperglycemia blood glucose of 619 and CO2 of 25 with anion gap of 14.  Alk phos was 134 with albumin of 2.7 and troponin 8.5.  Lactic acid was 1.8 CBC showed leukocytosis of 14.5 with neutrophilia and anemia.  Influenza antigens and COVID-19 PCR came back negative.  Blood cultures were drawn.  Two-view chest x-ray showed diffuse subcutaneous gas along the plantar foot extending into the third toe with findings concerning for necrotizing fasciitis and no acute fracture or dislocation.  Dr. Caryl Comes was notified about the patient and evaluated him at bedside   12/22: Postop day #2.  Patient still with some significant pain.  Also endorsing back pain in addition to throbbing pain in the affected extremity.   Assessment & Plan:   Active Problems:   Necrotizing fasciitis of ankle and foot (HCC)  Left foot  necrotizing fasciitis  with associated severe purulent left third toe cellulitisabscess.  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  Patient still endorses throbbing pain in affected extremity Plan: Continue intravenous vancomycin Continue cefepime Continue Flagyl Suspect that vancomycin can be discontinued within 24 hours if cultures remain negative for MRSA Appreciate ID and podiatry recommendations Multimodal pain control  Lower back pain:  hx of right paracentral disc protrusion at L5-S1.  CT L spine ordered to r/o infection.  None noted Plan: Multimodal pain control  Nonketotic hyperosmolar hyperglycemia:  Poorly controlled diabetes mellitus  Last hemoglobin A1c 12 Weaned off of insulin drip.  Plan: Continue on lantus, aspart & SSI w/ accuchecks.  Continue on IVFs.  DM coordinator consulted.   Leukocytosis:  secondary to above infection. Continue on IV abxs    Chronic kidney disease stage IIIa:  On intravenous fluids Monitor daily renal function Avoid nonessential nephrotoxins  Peripheral neuropathy  continue on home dose of gabapentin   Hyponatremia  likely pseudohyponatremia from hyperglycemia  Hypokalemia  KCl repleted. 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  No need for a transfusion at this time.  DVT prophylaxis: SQ Lovenox Code Status: Full Family Communication: None today Disposition Plan:Status is: Inpatient  Remains inpatient appropriate because:Inpatient level of care appropriate due to severity  of illness   Dispo: The patient is from: Home              Anticipated d/c is to: Home              Anticipated d/c date is: 3 days              Patient currently is not medically stable to  d/c.         Consultants:   Podiatry  Infectious disease  Procedures:   Left foot I&D 11/07/2020  Antimicrobials:   Vancomycin  Cefepime  Metronidazole   Subjective: Seen and examined.  Reports intermittent throbbing pain in left foot.  Also endorses chronic back pain.  Objective: Vitals:   11/09/20 0900 11/09/20 1000 11/09/20 1100 11/09/20 1200  BP: 123/78 128/83 131/80 131/78  Pulse: 80 78 79 84  Resp:      Temp: 97.9 F (36.6 C)     TempSrc: Oral     SpO2: 98% 97% 99% 100%  Weight:      Height:        Intake/Output Summary (Last 24 hours) at 11/09/2020 1425 Last data filed at 11/09/2020 1200 Gross per 24 hour  Intake 3562.8 ml  Output 2450 ml  Net 1112.8 ml   Filed Weights   11/07/20 1752  Weight: 102.1 kg    Examination:  General exam: Appears calm and comfortable  Respiratory system: Clear to auscultation. Respiratory effort normal. Cardiovascular system: S1 & S2 heard, RRR. No JVD, murmurs, rubs, gallops or clicks. No pedal edema. Gastrointestinal system: Abdomen is nondistended, soft and nontender. No organomegaly or masses felt. Normal bowel sounds heard. Central nervous system: Alert and oriented. No focal neurological deficits. Extremities: Symmetric 5 x 5 power.  Left foot in surgical wraps, not removed.  Dressings appear C/D/I Skin: No rashes, lesions or ulcers Psychiatry: Judgement and insight appear normal. Mood & affect appropriate.     Data Reviewed: I have personally reviewed following labs and imaging studies  CBC: Recent Labs  Lab 11/07/20 1758 11/08/20 0343 11/08/20 1206 11/09/20 0316  WBC 14.5* 11.8* 12.3* 11.7*  NEUTROABS 12.9*  --   --   --   HGB 10.1* 8.0* 7.9* 7.8*  HCT 31.6* 25.0* 24.9* 24.8*  MCV 83.6 83.6 84.1 85.2  PLT 346 266 250 811   Basic Metabolic Panel: Recent Labs  Lab 11/07/20 2343 11/08/20 0343 11/08/20 0726 11/08/20 1206 11/09/20 0316  NA 126* 131* 129* 130* 134*  K 3.0* 2.9* 2.7*  2.9* 3.2*  CL 93* 96* 95* 96* 100  CO2 _0 GLUCOSE 413* 183* 168* 167* 201*  BUN _1 CREATININE 1.46* 1.23 1.34* 1.35* 1.24  CALCIUM 8.3* 7.9* 7.8* 7.9* 7.6*   GFR: Estimated Creatinine Clearance: 91 mL/min (by C-G formula based on SCr of 1.24 mg/dL). Liver Function Tests: Recent Labs  Lab 11/07/20 1758  AST 25  ALT 17  ALKPHOS 134*  BILITOT 1.1  PROT 8.5*  ALBUMIN 2.7*   No results for input(s): LIPASE, AMYLASE in the last 168 hours. No results for input(s): AMMONIA in the last 168 hours. Coagulation Profile: Recent Labs  Lab 11/07/20 1758  INR 1.2   Cardiac Enzymes: No results for input(s): CKTOTAL, CKMB, CKMBINDEX, TROPONINI in the last 168 hours. BNP (last 3 results) No results for input(s): PROBNP in the last 8760 hours. HbA1C: Recent Labs    11/07/20 2343  HGBA1C 12.0*   CBG: Recent Labs  Lab 11/08/20 1117 11/08/20 1646 11/08/20 2134 11/09/20 0858 11/09/20 1152  GLUCAP 153* 234* 245* 272* 218*   Lipid Profile: No results for input(s): CHOL, HDL, LDLCALC, TRIG, CHOLHDL, LDLDIRECT in the last 72 hours. Thyroid Function Tests: No results for input(s): TSH, T4TOTAL, FREET4, T3FREE, THYROIDAB in the last 72 hours. Anemia Panel: No results for input(s): VITAMINB12, FOLATE, FERRITIN, TIBC, IRON, RETICCTPCT in the last 72 hours. Sepsis Labs: Recent Labs  Lab 11/07/20 1758 11/07/20 2343  LATICACIDVEN 1.8 2.8*    Recent Results (from the past 240 hour(s))  Urine culture     Status: None   Collection Time: 11/07/20  5:49 PM   Specimen: In/Out Cath Urine  Result Value Ref Range Status   Specimen Description   Final    IN/OUT CATH URINE Performed at [facility], [address], [place], [place] [address]    Special Requests   Final    NONE Performed at [facility], [address], [place], [place] [address]    Culture   Final    NO GROWTH Performed at [facility], [place] [address]  [place], [place] [address]    Report Status 11/08/2020 FINAL  Final  Blood Culture (routine x 2)     Status: None (Preliminary result)   Collection Time: 11/07/20  5:58 PM   Specimen: BLOOD  Result Value Ref Range Status   Specimen Description BLOOD RIGHT ANTECUBITAL  Final   Special Requests   Final    BOTTLES DRAWN AEROBIC AND ANAEROBIC Blood Culture results may not be optimal due to an excessive volume of blood received in culture bottles Performed at [facility], [address] [place], [place] [address]    Culture  Setup Time   Final    GRAM POSITIVE COCCI IN BOTH AEROBIC AND ANAEROBIC BOTTLES CRITICAL VALUE NOTED.  VALUE IS CONSISTENT WITH PREVIOUSLY REPORTED AND CALLED VALUE.    Culture GRAM POSITIVE COCCI  Final   Report Status PENDING  Incomplete  Blood Culture (routine x 2)     Status: Abnormal (Preliminary result)   Collection Time: 11/07/20  5:58 PM   Specimen: BLOOD  Result Value Ref Range Status   Specimen Description   Final    BLOOD BLOOD LEFT FOREARM Performed at [facility], [address], [place], [place] [address]    Special Requests   Final    BOTTLES DRAWN AEROBIC AND ANAEROBIC Blood Culture adequate volume Performed at [facility], [address] [place], [place] [address]    Culture  Setup Time   Final    Organism ID to follow GRAM POSITIVE COCCI IN BOTH AEROBIC AND ANAEROBIC BOTTLES CRITICAL RESULT CALLED TO, READ BACK BY AND VERIFIED WITH: Laqueta Carina AT 7341 12/21/821 SDR Performed at [facility], [address], [place], [place] [address]    Culture (A)  Final    GROUP B STREP(S.AGALACTIAE)ISOLATED SUSCEPTIBILITIES TO FOLLOW Performed at [facility], [place] [address], [place], [place] [address]    Report Status PENDING  Incomplete  Resp Panel by RT-PCR (Flu A&B, Covid) Nasopharyngeal Swab     Status: None   Collection Time: 11/07/20  5:58 PM   Specimen: Nasopharyngeal Swab; Nasopharyngeal(NP)  swabs in vial transport medium  Result Value Ref Range Status   SARS Coronavirus 2 by RT PCR NEGATIVE NEGATIVE Final    Comment: (NOTE) SARS-CoV-2 target nucleic acids are NOT DETECTED.  The SARS-CoV-2 RNA is generally detectable in upper  respiratory specimens during the acute phase of infection. The lowest concentration of SARS-CoV-2 viral copies this assay can detect is 138 copies/mL. A negative result does not preclude SARS-Cov-2 infection and should not be used as the sole basis for treatment or other patient management decisions. A negative result may occur with  improper specimen collection/handling, submission of specimen other than nasopharyngeal swab, presence of viral mutation(s) within the areas targeted by this assay, and inadequate number of viral copies(<138 copies/mL). A negative result must be combined with clinical observations, patient history, and epidemiological information. The expected result is Negative.  Fact Sheet for Patients:  EntrepreneurPulse.com.au  Fact Sheet for Healthcare Providers:  IncredibleEmployment.be  This test is no t yet approved or cleared by the [place] FDA and  has been authorized for detection and/or diagnosis of SARS-CoV-2 by FDA under an Emergency Use Authorization (EUA). This EUA will remain  in effect (meaning this test can be used) for the duration of the COVID-19 declaration under Section 564(b)(1) of the Act, 21 U.S.C.section 360bbb-3(b)(1), unless the authorization is terminated  or revoked sooner.       Influenza A by PCR NEGATIVE NEGATIVE Final   Influenza B by PCR NEGATIVE NEGATIVE Final    Comment: (NOTE) The Xpert Xpress SARS-CoV-2/FLU/RSV plus assay is intended as an aid in the diagnosis of influenza from Nasopharyngeal swab specimens and should not be used as a sole basis for treatment. Nasal washings and aspirates are unacceptable for Xpert Xpress  SARS-CoV-2/FLU/RSV testing.  Fact Sheet for Patients: EntrepreneurPulse.com.au  Fact Sheet for Healthcare Providers: IncredibleEmployment.be  This test is not yet approved or cleared by the [place] FDA and has been authorized for detection and/or diagnosis of SARS-CoV-2 by FDA under an Emergency Use Authorization (EUA). This EUA will remain in effect (meaning this test can be used) for the duration of the COVID-19 declaration under Section 564(b)(1) of the Act, 21 U.S.C. section 360bbb-3(b)(1), unless the authorization is terminated or revoked.  Performed at [facility], [address] [place], [place] [address]   Blood Culture ID Panel (Reflexed)     Status: Abnormal   Collection Time: 11/07/20  5:58 PM  Result Value Ref Range Status   Enterococcus faecalis NOT DETECTED NOT DETECTED Final   Enterococcus Faecium NOT DETECTED NOT DETECTED Final   Listeria monocytogenes NOT DETECTED NOT DETECTED Final   Staphylococcus species NOT DETECTED NOT DETECTED Final   Staphylococcus aureus (BCID) NOT DETECTED NOT DETECTED Final   Staphylococcus epidermidis NOT DETECTED NOT DETECTED Final   Staphylococcus lugdunensis NOT DETECTED NOT DETECTED Final   Streptococcus species DETECTED (A) NOT DETECTED Final    Comment: CRITICAL RESULT CALLED TO, READ BACK BY AND VERIFIED WITH:  SUSAN WATSON AT 1307 11/08/20 SDR    Streptococcus agalactiae DETECTED (A) NOT DETECTED Final    Comment: CRITICAL RESULT CALLED TO, READ BACK BY AND VERIFIED WITH:  SUSAN WATSON AT 8786 11/08/20 SDR    Streptococcus pneumoniae NOT DETECTED NOT DETECTED Final   Streptococcus pyogenes NOT DETECTED NOT DETECTED Final   A.calcoaceticus-baumannii NOT DETECTED NOT DETECTED Final   Bacteroides fragilis NOT DETECTED NOT DETECTED Final   Enterobacterales NOT DETECTED NOT DETECTED Final   Enterobacter cloacae complex NOT DETECTED NOT DETECTED Final   Escherichia coli NOT  DETECTED NOT DETECTED Final   Klebsiella aerogenes NOT DETECTED NOT DETECTED Final   Klebsiella oxytoca NOT DETECTED NOT DETECTED Final   Klebsiella pneumoniae NOT DETECTED NOT DETECTED Final   Proteus species NOT DETECTED  NOT DETECTED Final   Salmonella species NOT DETECTED NOT DETECTED Final   Serratia marcescens NOT DETECTED NOT DETECTED Final   Haemophilus influenzae NOT DETECTED NOT DETECTED Final   Neisseria meningitidis NOT DETECTED NOT DETECTED Final   Pseudomonas aeruginosa NOT DETECTED NOT DETECTED Final   Stenotrophomonas maltophilia NOT DETECTED NOT DETECTED Final   Candida albicans NOT DETECTED NOT DETECTED Final   Candida auris NOT DETECTED NOT DETECTED Final   Candida glabrata NOT DETECTED NOT DETECTED Final   Candida krusei NOT DETECTED NOT DETECTED Final   Candida parapsilosis NOT DETECTED NOT DETECTED Final   Candida tropicalis NOT DETECTED NOT DETECTED Final   Cryptococcus neoformans/gattii NOT DETECTED NOT DETECTED Final    Comment: Performed at [facility], [address] [place], [place] [address]  MRSA PCR Screening     Status: None   Collection Time: 11/07/20  8:03 PM   Specimen: Nasopharyngeal  Result Value Ref Range Status   MRSA by PCR NEGATIVE NEGATIVE Final    Comment:        The GeneXpert MRSA Assay (FDA approved for NASAL specimens only), is one component of a comprehensive MRSA colonization surveillance program. It is not intended to diagnose MRSA infection nor to guide or monitor treatment for MRSA infections. Performed at [facility], [address] [place], [place] [address]   Anaerobic culture     Status: None (Preliminary result)   Collection Time: 11/07/20  8:13 PM   Specimen: PATH Other  Result Value Ref Range Status   Specimen Description   Final    TOE LEFT Performed at [facility], [address] [place], [place] [address]    Special Requests BONE  Final   Gram Stain   Final    RARE WBC  PRESENT,BOTH PMN AND MONONUCLEAR MODERATE GRAM POSITIVE COCCI IN PAIRS IN CLUSTERS Performed at [facility], [place] [address], [place], [place] [address]    Culture PENDING  Incomplete   Report Status PENDING  Incomplete  Aerobic/Anaerobic Culture (surgical/deep wound)     Status: None (Preliminary result)   Collection Time: 11/07/20  8:13 PM   Specimen: PATH Other; Abscess  Result Value Ref Range Status   Specimen Description   Final    ABSCESS Performed at [facility], [address] [place], [place] [address]    Special Requests   Final    LEFT FOOT Performed at [facility], [place], [place] [address]    Gram Stain   Final    RARE WBC PRESENT,BOTH PMN AND MONONUCLEAR ABUNDANT GRAM POSITIVE COCCI IN PAIRS IN CLUSTERS    Culture   Final    FEW GROUP B STREP(S.AGALACTIAE)ISOLATED TESTING AGAINST S. AGALACTIAE NOT ROUTINELY PERFORMED DUE TO PREDICTABILITY OF AMP/PEN/VAN SUSCEPTIBILITY. Performed at [facility], [place] [address] [place], [place] [address]    Report Status PENDING  Incomplete  Aerobic Culture (superficial specimen)     Status: None (Preliminary result)   Collection Time: 11/07/20  8:13 PM   Specimen: PATH Other  Result Value Ref Range Status   Specimen Description   Final    ABSCESS Performed at [facility], [address] [place],  [address]    Special Requests LEFT FOOT BONE  Final   Gram Stain   Final    NO WBC SEEN FEW GRAM POSITIVE COCCI IN PAIRS IN CLUSTERS    Culture   Final    MODERATE GROUP B  STREP(S.AGALACTIAE)ISOLATED TESTING AGAINST S. AGALACTIAE NOT ROUTINELY PERFORMED DUE TO PREDICTABILITY OF AMP/PEN/VAN SUSCEPTIBILITY. Performed at [facility], [place] [address] [place], [place] [address]    Report Status PENDING  Incomplete         Radiology Studies: CT LUMBAR SPINE W CONTRAST  Result Date: 11/08/2020 CLINICAL DATA:  Low back pain, infection suspected. Left  foot amputation yesterday. Diabetes. Severe low back pain. EXAM: CT LUMBAR SPINE WITH CONTRAST TECHNIQUE: Multidetector CT imaging of the lumbar spine was performed with intravenous contrast administration. CONTRAST:  134m OMNIPAQUE IOHEXOL 300 MG/ML  SOLN COMPARISON:  MRI of lumbar spine 10/15/2018 FINDINGS: Segmentation: 5 non rib-bearing lumbar type vertebral bodies are present. The lowest fully formed vertebral body is L5. Alignment: No significant listhesis is present. Lumbar lordosis is preserved. Vertebrae: Vertebral body heights maintained. No focal lytic or blastic lesions are present. Paraspinal and other soft tissues: Paraspinous soft tissues are unremarkable. No focal inflammatory change or fluid collection is present. Visualized portions the abdomen are unremarkable. Disc levels: T12-L1: Mild facet hypertrophy is present. No significant disc disease or stenosis is present. L1-2: Facet hypertrophy is present. Minimal disc bulging is present. No significant stenosis is present. L2-3: Mild broad-based disc bulge is present. Moderate facet hypertrophy is noted bilaterally. Mild foraminal narrowing is present bilaterally. L3-4: Broad-based disc protrusion is present. Moderate facet hypertrophy has progressed. This results in mild central and bilateral foraminal stenosis. L4-5: Broad-based disc protrusion is present. Moderate facet hypertrophy is noted bilaterally. Moderate central canal narrowing is worse left than right. Moderate bilateral foraminal narrowing is again seen. L5-S1: Right paramedian disc protrusion is present with calcification. Severe right subarticular stenosis is again seen. Moderate to severe right and moderate left foraminal narrowing is present. SI joints are fused bilaterally. No significant intracanalicular enhancement is evident. IMPRESSION: 1. Progressive multilevel spondylosis of the lumbar spine as described. 2. Severe right subarticular and moderate to severe right foraminal  stenosis at L5-S1. 3. Moderate central canal narrowing at L4-5 is worse left than right. 4. Moderate bilateral foraminal narrowing at L4-5. 5. Progressive mild central and bilateral foraminal stenosis at L3-4. 6. Fusion of the SI joints bilaterally. 7. No focal soft tissue enhancement or fluid collection to suggest infection. If clinical concern persists, MRI more sensitive for evaluation of soft tissues in the spinal canal and foramina. Electronically Signed   By: CSan MorelleM.D.   On: 11/08/2020 15:21   DG Foot 2 Views Left  Result Date: 11/07/2020 CLINICAL DATA:  44year old male with left foot infection. EXAM: LEFT FOOT - 2 VIEW COMPARISON:  None. FINDINGS: There is no acute fracture or dislocation. There is mild osteopenia and mild hallux valgus. No periosteal elevation or bone erosion to suggest osteomyelitis. There is diffuse subcutaneous gas along the plantar foot extending into the third toe. There is diffuse soft tissue swelling and subcutaneous edema. IMPRESSION: 1. No acute fracture or dislocation. 2. Diffuse subcutaneous gas along the plantar foot extending into the third toe. Findings concerning for necrotizing fasciitis. Electronically Signed   By: AAnner CreteM.D.   On: 11/07/2020 18:25        Scheduled Meds: . acetaminophen  650 mg Oral Q6H  . atorvastatin  40 mg Oral Daily  . busPIRone  15 mg Oral BID  . Chlorhexidine Gluconate Cloth  6 each Topical Daily  . dicyclomine  10 mg Oral TID AC & HS  . enoxaparin (LOVENOX) injection  50 mg Subcutaneous Daily  . famotidine  20  mg Oral BID  . ferrous sulfate  325 mg Oral BID WC  . gabapentin  600 mg Oral QHS  . insulin aspart  0-20 Units Subcutaneous TID AC & HS  . insulin aspart  10 Units Subcutaneous TID WC  . insulin glargine  10 Units Subcutaneous Once  . [START ON 11/10/2020] insulin glargine  80 Units Subcutaneous Daily  . lidocaine  1 patch Transdermal Q24H  . methocarbamol  500 mg Oral QID  .  metoCLOPramide  10 mg Oral TID AC  . multivitamin with minerals  1 tablet Oral Daily  . pantoprazole  40 mg Oral Daily  . sertraline  100 mg Oral Daily  . vitamin B-12  500 mcg Oral Daily   Continuous Infusions: . sodium chloride 100 mL/hr at 11/09/20 0800  . ceFEPime (MAXIPIME) IV 2 g (11/09/20 1410)  . dextrose 5% lactated ringers Stopped (11/08/20 1400)  . lactated ringers    . lactated ringers    . metronidazole 500 mg (11/09/20 0926)  . vancomycin 750 mg (11/09/20 1040)     LOS: 2 days    Time spent: 25 minutes    Sidney Ace, MD [facility] Pager [phone]  If 7PM-7AM, please contact night-coverage 11/09/2020, 2:25 PM

## 2020-11-09 NOTE — Progress Notes (Signed)
2 Days Post-Op   Subjective/Chief Complaint: Patient seen.  Still some significant pain in his left foot.   Objective: Vital signs in last 24 hours: Temp:  [97.9 F (36.6 C)-100 F (37.8 C)] 97.9 F (36.6 C) (12/22 0900) Pulse Rate:  [76-95] 84 (12/22 1200) Resp:  [9-31] 31 (12/21 2112) BP: (116-140)/(71-83) 131/78 (12/22 1200) SpO2:  [89 %-100 %] 100 % (12/22 1200) Last BM Date: 11/04/20  Intake/Output from previous day: 12/21 0701 - 12/22 0700 In: 3462.8 [P.O.:240; I.V.:2622.7; IV Piggyback:600.1] Out: 1875 [Urine:1875] Intake/Output this shift: Total I/O In: 100 [I.V.:100] Out: 1100 [Urine:1100]  Continued heavy drainage noted on the bandaging on the left foot.  Upon removal the dorsal and distal aspect of the incision still appears well coapted.  Erythema and edema significantly improved.  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    Lab Results:  Recent Labs    11/08/20 1206 11/09/20 0316  WBC 12.3* 11.7*  HGB 7.9* 7.8*  HCT 24.9* 24.8*  PLT 250 244   BMET Recent Labs    11/08/20 1206 11/09/20 0316  NA 130* 134*  K 2.9* 3.2*  CL 96* 100  CO2 25 26  GLUCOSE 167* 201*  BUN 12 12  CREATININE 1.35* 1.24  CALCIUM 7.9* 7.6*   PT/INR Recent Labs    11/07/20 1758  LABPROT 14.8  INR 1.2   ABG No results for input(s): PHART, HCO3 in the last 72 hours.  Invalid input(s): PCO2, PO2  Studies/Results: CT LUMBAR SPINE W CONTRAST  Result Date: 11/08/2020 CLINICAL DATA:  Low back pain, infection suspected. 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 Diabetes. Severe low back pain. EXAM: CT LUMBAR SPINE WITH CONTRAST TECHNIQUE: Multidetector CT imaging of the lumbar spine was performed with intravenous contrast administration. CONTRAST:  OMNIPAQUE IOHEXOL 300 MG/ML  SOLN COMPARISON:  MRI of lumbar spine 10/15/2018 FINDINGS: Segmentation: 5 non rib-bearing lumbar type vertebral bodies are present. The  lowest fully formed vertebral body is L5. Alignment: No significant listhesis is present. Lumbar lordosis is preserved. Vertebrae: Vertebral body heights maintained. No focal lytic or blastic lesions are present. Paraspinal and other soft tissues: Paraspinous soft tissues are unremarkable. No focal inflammatory change or fluid collection is present. Visualized portions the abdomen are unremarkable. Disc levels: T12-L1: Mild facet hypertrophy is present. No significant disc disease or stenosis is present. L1-2: Facet hypertrophy is present. Minimal disc bulging is present. No significant stenosis is present. L2-3: Mild broad-based disc bulge is present. Moderate facet hypertrophy is noted bilaterally. Mild foraminal narrowing is present bilaterally. L3-4: Broad-based disc protrusion is present. Moderate facet hypertrophy has progressed. This results in mild central and bilateral foraminal stenosis. L4-5: Broad-based disc protrusion is present. Moderate facet hypertrophy is noted bilaterally. Moderate central canal narrowing is worse left than right. Moderate bilateral foraminal narrowing is again seen. L5-S1: Right paramedian disc protrusion is present with calcification. Severe right subarticular stenosis is again seen. Moderate to severe right and moderate left foraminal narrowing is present. SI joints are fused bilaterally. No significant intracanalicular enhancement is evident. IMPRESSION: 1. Progressive multilevel spondylosis of the lumbar spine as described. 2. 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 3. Moderate central canal narrowing at L4-5 is worse left than right. 4. Moderate bilateral foraminal narrowing at L4-5. 5. Progressive mild central and bilateral foraminal stenosis at L3-4. 6. Fusion of the SI joints bilaterally. 7. No focal soft tissue enhancement or fluid collection  to suggest infection. If clinical concern persists, MRI more sensitive for evaluation  of soft tissues in the spinal canal and foramina. Electronically Signed   By: Marin Roberts M.D.   On: 11/08/2020 15:21   DG Foot 2 Views Left  Result Date: 11/07/2020 CLINICAL DATA:  44 year old male with left foot infection. EXAM: LEFT FOOT - 2 VIEW COMPARISON:  None. FINDINGS: There is no acute fracture or dislocation. There is mild osteopenia and mild hallux valgus. No periosteal elevation or bone erosion to suggest osteomyelitis. There is diffuse subcutaneous gas along the plantar foot extending into the third toe. There is diffuse soft tissue swelling and subcutaneous edema. IMPRESSION: 1. No acute fracture or dislocation. 2. Diffuse subcutaneous gas along the plantar foot extending into the third toe. Findings concerning for necrotizing fasciitis. Electronically Signed   By: Elgie Collard M.D.   On: 11/07/2020 18:25    Anti-infectives: Anti-infectives (From admission, onward)   Start     Dose/Rate Route Frequency Ordered Stop   11/09/20 0600  ceFEPIme (MAXIPIME) 2 g in sodium chloride 0.9 % 100 mL IVPB        2 g 200 mL/hr over 30 Minutes Intravenous Every 8 hours 11/09/20 0009     11/09/20 0000  metroNIDAZOLE (FLAGYL) IVPB 500 mg        500 mg 100 mL/hr over 60 Minutes Intravenous Every 8 hours 11/08/20 1954     11/08/20 2200  ceFEPIme (MAXIPIME) 2 g in sodium chloride 0.9 % 100 mL IVPB  Status:  Discontinued        2 g 200 mL/hr over 30 Minutes Intravenous Every 12 hours 11/08/20 1952 11/09/20 0009   11/08/20 1800  vancomycin (VANCOREADY) IVPB 1250 mg/250 mL  Status:  Discontinued        1,250 mg 166.7 mL/hr over 90 Minutes Intravenous Every 24 hours 11/07/20 1939 11/08/20 1258   11/08/20 1800  Ampicillin-Sulbactam (UNASYN) 3 g in sodium chloride 0.9 % 100 mL IVPB  Status:  Discontinued        3 g 200 mL/hr over 30 Minutes Intravenous Every 6 hours 11/08/20 1636 11/08/20 1951   11/08/20 1400  vancomycin (VANCOREADY) IVPB 750 mg/150 mL        750 mg 150 mL/hr over 60  Minutes Intravenous Every 12 hours 11/08/20 1258     11/08/20 0800  piperacillin-tazobactam (ZOSYN) IVPB 3.375 g  Status:  Discontinued        3.375 g 12.5 mL/hr over 240 Minutes Intravenous Every 8 hours 11/07/20 1939 11/08/20 1636   11/07/20 2200  clindamycin (CLEOCIN) IVPB 600 mg  Status:  Discontinued        600 mg 100 mL/hr over 30 Minutes Intravenous Every 8 hours 11/07/20 1924 11/08/20 0834   11/07/20 1930  vancomycin (VANCOCIN) IVPB 1000 mg/200 mL premix  Status:  Discontinued        1,000 mg 200 mL/hr over 60 Minutes Intravenous  Once 11/07/20 1924 11/07/20 2244   11/07/20 1930  piperacillin-tazobactam (ZOSYN) IVPB 3.375 g  Status:  Discontinued        3.375 g 100 mL/hr over 30 Minutes Intravenous  Once 11/07/20 1924 11/08/20 1636   11/07/20 1815  vancomycin (VANCOREADY) IVPB 2000 mg/400 mL  Status:  Discontinued        2,000 mg 200 mL/hr over 120 Minutes Intravenous STAT 11/07/20 1801 11/07/20 1802   11/07/20 1815  vancomycin (VANCOCIN) IVPB 1000 mg/200 mL premix  Status:  Discontinued  1,000 mg 200 mL/hr over 60 Minutes Intravenous STAT 11/07/20 1802 11/08/20 1258   11/07/20 1800  ceFEPIme (MAXIPIME) 2 g in sodium chloride 0.9 % 100 mL IVPB        2 g 200 mL/hr over 30 Minutes Intravenous STAT 11/07/20 1757 11/07/20 2303      Assessment/Plan: s/p Procedure(s): INCISION AND DRAINAGE (Left) 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   Plan: Packing was removed and replaced with new iodoform gauze followed by a bulky bandage.  Discussed with the patient that at this point I would like to give it a couple more days for some more demarcation and plan for a repeat I&D on Friday.  Discussed that possibly at that time we may be able to apply a wound VAC to the wound.  Still discussed that he is at significant risk for limb loss.  Plan for a dressing change tomorrow but leave the packing intact and then  plan for debridement on Friday  LOS: 2 days    Ricci Barker 11/09/2020

## 2020-11-09 NOTE — Progress Notes (Signed)
Neuro: Stable at baseline  Resp: Stable on room air while awake CV: afebrile, vitals stable GIGU: voiding in urinal, no BM, no emesis, diabetic diet Skin: L foot dressing changed by surgery Social: Mother at bedside most of the day, all questions and concerns addressed. Patient is updating wife as needed.  Events: Transferred to room 245, report given to [employer]

## 2020-11-09 NOTE — Progress Notes (Signed)
Inpatient Diabetes Program Recommendations  AACE/ADA: New Consensus Statement on Inpatient Glycemic Control (2015)  Target Ranges:  Prepandial:   less than 140 mg/dL      Peak postprandial:   less than 180 mg/dL (1-2 hours)      Critically ill patients:  140 - 180 mg/dL   Results for Richard Mendoza, Richard Mendoza (MRN [id number]) as of 11/09/2020 12:30  Ref. Range 11/08/2020 07:21 11/08/2020 09:29 11/08/2020 11:17 11/08/2020 16:46 11/08/2020 21:34  Glucose-Capillary Latest Ref Range: 70 - 99 mg/dL 595 (H) 638 (H) 756 (H)  70 units LANTUS @2pm  234 (H)  13 units NOVOLOG  245 (H)  7 units NOVOLOG    Results for Richard Mendoza, Richard Mendoza (MRN [name]) as of 11/09/2020 12:30  Ref. Range 11/09/2020 08:58 11/09/2020 11:52  Glucose-Capillary Latest Ref Range: 70 - 99 mg/dL 11/11/2020 (H)  17 units NOVOLOG  70 units LANTUS   218 (H)  13 units NOVOLOG     Home DM Meds: Insulin Pump                             Actos 30 mg Daily  Current Orders: Lantus 70 units Daily      Novolog Resistant Correction Scale/ SSI (0-20 units) TID AC + HS      Novolog 6 units TID with meals    Endocrinologist: [id number], FNP with [facility]--last seen 08/31/2020 Basal rates= 3.6 units/hr Total Basal per 24 hour period= 86.4 units Carbohydrate Ratio= 1 unit for every 4 grams Carbohydrates Correction Factor= 1 unit for every 20 mg/dl above Target CBG    Does not have insulin pump in the hospital--Pump was sent home with wife    MD- Please consider:  1. Increase Lantus to 80 units Daily  Please also consider giving extra 10 units Lantus X 1 dose today since 70 unit dose was already given this AM  2. Increase Novolog Meal Coverage to 10 units TID with meals     --Will follow patient during hospitalization--  09/02/2020 RN, MSN, CDE Diabetes Coordinator Inpatient Glycemic Control Team Team Pager: [phone] (8a-5p)

## 2020-11-10 ENCOUNTER — Inpatient Hospital Stay: Payer: Medicaid Other

## 2020-11-10 DIAGNOSIS — Z8614 Personal history of Methicillin resistant Staphylococcus aureus infection: Secondary | ICD-10-CM

## 2020-11-10 DIAGNOSIS — M726 Necrotizing fasciitis: Secondary | ICD-10-CM | POA: Diagnosis not present

## 2020-11-10 DIAGNOSIS — E1100 Type 2 diabetes mellitus with hyperosmolarity without nonketotic hyperglycemic-hyperosmolar coma (NKHHC): Secondary | ICD-10-CM

## 2020-11-10 DIAGNOSIS — L0889 Other specified local infections of the skin and subcutaneous tissue: Secondary | ICD-10-CM

## 2020-11-10 LAB — CULTURE, BLOOD (ROUTINE X 2): Special Requests: ADEQUATE

## 2020-11-10 LAB — AEROBIC CULTURE W GRAM STAIN (SUPERFICIAL SPECIMEN): Gram Stain: NONE SEEN

## 2020-11-10 LAB — GLUCOSE, CAPILLARY
Glucose-Capillary: 153 mg/dL — ABNORMAL HIGH (ref 70–99)
Glucose-Capillary: 103 mg/dL — ABNORMAL HIGH (ref 70–99)
Glucose-Capillary: 109 mg/dL — ABNORMAL HIGH (ref 70–99)
Glucose-Capillary: 68 mg/dL — ABNORMAL LOW (ref 70–99)
Glucose-Capillary: 204 mg/dL — ABNORMAL HIGH (ref 70–99)

## 2020-11-10 MED ORDER — INSULIN GLARGINE 100 UNIT/ML ~~LOC~~ SOLN
75.0000 [IU] | Freq: Every day | SUBCUTANEOUS | Status: DC
  Administered 2020-11-11 – 2020-11-14 (×4): 75 [IU] via SUBCUTANEOUS
  Filled 2020-11-10 (×5): qty 0.75

## 2020-11-10 MED ORDER — GADOBUTROL 1 MMOL/ML IV SOLN
10.0000 mL | Freq: Once | INTRAVENOUS | Status: AC | PRN
  Administered 2020-11-10: 10 mL via INTRAVENOUS

## 2020-11-10 MED ORDER — INSULIN ASPART 100 UNIT/ML ~~LOC~~ SOLN
5.0000 [IU] | Freq: Three times a day (TID) | SUBCUTANEOUS | Status: DC
  Administered 2020-11-11 – 2020-11-15 (×12): 5 [IU] via SUBCUTANEOUS
  Filled 2020-11-10 (×12): qty 1

## 2020-11-10 NOTE — Progress Notes (Signed)
PROGRESS NOTE    Richard Mendoza  EPP:[id number] DOB: 1976-04-13 DOA: 11/07/2020 PCP: Tracie Harrier, MD   Brief Narrative:   44 y.o. 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  His blood glucose levels have been significantly elevated.  He admitted to fever and chills at home.  He has been having nausea without vomiting or diarrhea or abdominal pain.  He denied any dyspnea or cough or wheezing.  He admits to polyuria and polydipsia.  He was given doxycycline by Dr. Vickki Muff and has been on it.  His left foot pain has been worsening with associated erythema, swelling and radiation to his left leg.  No chest pain or dyspnea or palpitations.  No dysuria, oliguria or hematuria or flank pain.  Upon presentation to the emergency room, temperature was 101.2 heart rate of 104 and otherwise normal vital signs.  Labs revealed hyponatremia 121 with hypochloremia of 82, hypokalemia 3.4 and significant hyperglycemia blood glucose of 619 and CO2 of 25 with anion gap of 14.  Alk phos was 134 with albumin of 2.7 and troponin 8.5.  Lactic acid was 1.8 CBC showed leukocytosis of 14.5 with neutrophilia and anemia.  Influenza antigens and COVID-19 PCR came back negative.  Blood cultures were drawn.  Two-view chest x-ray showed diffuse subcutaneous gas along the plantar foot extending into the third toe with findings concerning for necrotizing fasciitis and no acute fracture or dislocation.  Dr. Caryl Comes was notified about the patient and evaluated him at bedside   12/22: Postop day #2.  Patient still with some significant pain.  Also endorsing back pain in addition to throbbing pain in the affected extremity. 12/23: Improved pain control   Assessment & Plan:   Active Problems:   Necrotizing fasciitis of ankle and  foot (HCC)  Left foot necrotizing fasciitis  with associated severe purulent left third toe cellulitisabscess.  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  Pain control improving over interval Group B strep noted on wound cultures No evidence of MRSA Plan: DC IV vancomycin Continue cefepime Continue Flagyl Appreciate ID and podiatry recommendations Multimodal pain control Per patient podiatry may take him back for second look in the OR  Lower back pain:  hx of right paracentral disc protrusion at L5-S1.  CT L spine ordered to r/o infection.  None noted Plan: Multimodal pain control  Nonketotic hyperosmolar hyperglycemia:  Poorly controlled diabetes mellitus  Last hemoglobin A1c 12 Weaned off of insulin drip.  Plan: Continue on lantus, aspart & SSI w/ accuchecks.  Continue on IVFs.  DM coordinator consulted.   Leukocytosis:  secondary to above infection. Continue on IV abxs    Chronic kidney disease stage IIIa:  On intravenous fluids Monitor daily renal function Avoid nonessential nephrotoxins  Peripheral neuropathy  continue on home dose of gabapentin   Hyponatremia  likely pseudohyponatremia from hyperglycemia  Hypokalemia  KCl repleted. 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  No need for a transfusion at this time.  DVT prophylaxis: SQ Lovenox Code Status: Full Family Communication: None today Disposition Plan:Status is: Inpatient  Remains  inpatient appropriate because:Inpatient level of care appropriate due to severity of illness   Dispo: The patient is from: Home              Anticipated d/c is to: Home              Anticipated d/c date is: 3 days              Patient currently is not medically stable to  d/c.  Necrotizing fasciitis of lower extremity.  Plan to return to the OR within the next 24 to 48 hours.  Remains on IV antibiotics.  Disposition plan pending.       Consultants:   Podiatry  Infectious disease  Procedures:   Left foot I&D 11/07/2020  Antimicrobials:   Vancomycin  Cefepime  Metronidazole   Subjective: Seen and examined.  Improved pain control and foot and back.  Objective: Vitals:   11/09/20 1613 11/10/20 0302 11/10/20 0342 11/10/20 0758  BP: 112/73  124/80 (!) 157/94  Pulse: 71  79 72  Resp: 18   16  Temp: 98 F (36.7 C)  97.9 F (36.6 C) 97.9 F (36.6 C)  TempSrc: Oral  Oral Oral  SpO2: 98%  100% 98%  Weight:  102.8 kg    Height:        Intake/Output Summary (Last 24 hours) at 11/10/2020 1026 Last data filed at 11/10/2020 0757 Gross per 24 hour  Intake 1481.02 ml  Output 1950 ml  Net -468.98 ml   Filed Weights   11/07/20 1752 11/10/20 0302  Weight: 102.1 kg 102.8 kg    Examination:  General exam: Appears calm and comfortable  Respiratory system: Clear to auscultation. Respiratory effort normal. Cardiovascular system: S1 & S2 heard, RRR. No JVD, murmurs, rubs, gallops or clicks. No pedal edema. Gastrointestinal system: Abdomen is nondistended, soft and nontender. No organomegaly or masses felt. Normal bowel sounds heard. Central nervous system: Alert and oriented. No focal neurological deficits. Extremities: Symmetric 5 x 5 power.  Left foot in surgical wraps, not removed.  Dressings appear C/D/I Skin: No rashes, lesions or ulcers Psychiatry: Judgement and insight appear normal. Mood & affect appropriate.     Data Reviewed: I have personally reviewed following labs and imaging studies  CBC: Recent Labs  Lab 11/07/20 1758 11/08/20 0343 11/08/20 1206 11/09/20 0316  WBC 14.5* 11.8* 12.3* 11.7*  NEUTROABS 12.9*  --   --   --   HGB 10.1* 8.0* 7.9* 7.8*  HCT 31.6* 25.0* 24.9* 24.8*  MCV 83.6 83.6 84.1 85.2  PLT 346 266  250 115   Basic Metabolic Panel: Recent Labs  Lab 11/07/20 2343 11/08/20 0343 11/08/20 0726 11/08/20 1206 11/09/20 0316  NA 126* 131* 129* 130* 134*  K 3.0* 2.9* 2.7* 2.9* 3.2*  CL 93* 96* 95* 96* 100  CO2 _0 GLUCOSE 413* 183* 168* 167* 201*  BUN _1 CREATININE 1.46* 1.23 1.34* 1.35* 1.24  CALCIUM 8.3* 7.9* 7.8* 7.9* 7.6*   GFR: Estimated Creatinine Clearance: 91.3 mL/min (by C-G formula based on SCr of 1.24 mg/dL). Liver Function Tests: Recent Labs  Lab 11/07/20 1758  AST 25  ALT 17  ALKPHOS 134*  BILITOT 1.1  PROT 8.5*  ALBUMIN 2.7*   No results for input(s): LIPASE, AMYLASE in the last 168 hours. No results for input(s): AMMONIA in the last 168 hours. Coagulation Profile: Recent Labs  Lab 11/07/20 1758  INR 1.2   Cardiac Enzymes:  No results for input(s): CKTOTAL, CKMB, CKMBINDEX, TROPONINI in the last 168 hours. BNP (last 3 results) No results for input(s): PROBNP in the last 8760 hours. HbA1C: Recent Labs    11/07/20 2343  HGBA1C 12.0*   CBG: Recent Labs  Lab 11/08/20 2134 11/09/20 0858 11/09/20 1152 11/09/20 1619 11/10/20 0756  GLUCAP 245* 272* 218* 174* 153*   Lipid Profile: No results for input(s): CHOL, HDL, LDLCALC, TRIG, CHOLHDL, LDLDIRECT in the last 72 hours. Thyroid Function Tests: No results for input(s): TSH, T4TOTAL, FREET4, T3FREE, THYROIDAB in the last 72 hours. Anemia Panel: No results for input(s): VITAMINB12, FOLATE, FERRITIN, TIBC, IRON, RETICCTPCT in the last 72 hours. Sepsis Labs: Recent Labs  Lab 11/07/20 1758 11/07/20 2343  LATICACIDVEN 1.8 2.8*    Recent Results (from the past 240 hour(s))  Urine culture     Status: None   Collection Time: 11/07/20  5:49 PM   Specimen: In/Out Cath Urine  Result Value Ref Range Status   Specimen Description   Final    IN/OUT CATH URINE Performed at [facility], [address], [place], [place] [address]    Special Requests   Final     NONE Performed at [facility], [address], [place], [place] [address]    Culture   Final    NO GROWTH Performed at [facility], [place] [address], [place], [place] [address]    Report Status 11/08/2020 FINAL  Final  Blood Culture (routine x 2)     Status: None (Preliminary result)   Collection Time: 11/07/20  5:58 PM   Specimen: BLOOD  Result Value Ref Range Status   Specimen Description BLOOD RIGHT ANTECUBITAL  Final   Special Requests   Final    BOTTLES DRAWN AEROBIC AND ANAEROBIC Blood Culture results may not be optimal due to an excessive volume of blood received in culture bottles Performed at [facility], [address] [place], [place] [address]    Culture  Setup Time   Final    GRAM POSITIVE COCCI IN BOTH AEROBIC AND ANAEROBIC BOTTLES CRITICAL VALUE NOTED.  VALUE IS CONSISTENT WITH PREVIOUSLY REPORTED AND CALLED VALUE.    Culture GRAM POSITIVE COCCI  Final   Report Status PENDING  Incomplete  Blood Culture (routine x 2)     Status: Abnormal   Collection Time: 11/07/20  5:58 PM   Specimen: BLOOD  Result Value Ref Range Status   Specimen Description   Final    BLOOD BLOOD LEFT FOREARM Performed at [facility], [address], [place], [place] [address]    Special Requests   Final    BOTTLES DRAWN AEROBIC AND ANAEROBIC Blood Culture adequate volume Performed at [facility], [address] [place], [place] [address]    Culture  Setup Time   Final    Organism ID to follow GRAM POSITIVE COCCI IN BOTH AEROBIC AND ANAEROBIC BOTTLES CRITICAL RESULT CALLED TO, READ BACK BY AND VERIFIED WITH: Laqueta Carina AT 1031 12/21/821 Export Performed at [facility], [address] [place], [place] [address]    Culture GROUP B STREP(S.AGALACTIAE)ISOLATED (A)  Final   Report Status 11/10/2020 FINAL  Final   Organism ID, Bacteria GROUP B STREP(S.AGALACTIAE)ISOLATED  Final      Susceptibility   Group b  strep(s.agalactiae)isolated - MIC*    CLINDAMYCIN >=1 RESISTANT Resistant     AMPICILLIN <=0.25 SENSITIVE Sensitive     ERYTHROMYCIN 2 RESISTANT Resistant  VANCOMYCIN 0.5 SENSITIVE Sensitive     CEFTRIAXONE <=0.12 SENSITIVE Sensitive     LEVOFLOXACIN 1 SENSITIVE Sensitive     PENICILLIN Value in next row Sensitive      SENSITIVE<=0.06    * GROUP B STREP(S.AGALACTIAE)ISOLATED  Resp Panel by RT-PCR (Flu A&B, Covid) Nasopharyngeal Swab     Status: None   Collection Time: 11/07/20  5:58 PM   Specimen: Nasopharyngeal Swab; Nasopharyngeal(NP) swabs in vial transport medium  Result Value Ref Range Status   SARS Coronavirus 2 by RT PCR NEGATIVE NEGATIVE Final    Comment: (NOTE) SARS-CoV-2 target nucleic acids are NOT DETECTED.  The SARS-CoV-2 RNA is generally detectable in upper respiratory specimens during the acute phase of infection. The lowest concentration of SARS-CoV-2 viral copies this assay can detect is 138 copies/mL. A negative result does not preclude SARS-Cov-2 infection and should not be used as the sole basis for treatment or other patient management decisions. A negative result may occur with  improper specimen collection/handling, submission of specimen other than nasopharyngeal swab, presence of viral mutation(s) within the areas targeted by this assay, and inadequate number of viral copies(<138 copies/mL). A negative result must be combined with clinical observations, patient history, and epidemiological information. The expected result is Negative.  Fact Sheet for Patients:  EntrepreneurPulse.com.au  Fact Sheet for Healthcare Providers:  IncredibleEmployment.be  This test is no t yet approved or cleared by the [place] FDA and  has been authorized for detection and/or diagnosis of SARS-CoV-2 by FDA under an Emergency Use Authorization (EUA). This EUA will remain  in effect (meaning this test can be used) for the duration of  the COVID-19 declaration under Section 564(b)(1) of the Act, 21 U.S.C.section 360bbb-3(b)(1), unless the authorization is terminated  or revoked sooner.       Influenza A by PCR NEGATIVE NEGATIVE Final   Influenza B by PCR NEGATIVE NEGATIVE Final    Comment: (NOTE) The Xpert Xpress SARS-CoV-2/FLU/RSV plus assay is intended as an aid in the diagnosis of influenza from Nasopharyngeal swab specimens and should not be used as a sole basis for treatment. Nasal washings and aspirates are unacceptable for Xpert Xpress SARS-CoV-2/FLU/RSV testing.  Fact Sheet for Patients: EntrepreneurPulse.com.au  Fact Sheet for Healthcare Providers: IncredibleEmployment.be  This test is not yet approved or cleared by the [place] FDA and has been authorized for detection and/or diagnosis of SARS-CoV-2 by FDA under an Emergency Use Authorization (EUA). This EUA will remain in effect (meaning this test can be used) for the duration of the COVID-19 declaration under Section 564(b)(1) of the Act, 21 U.S.C. section 360bbb-3(b)(1), unless the authorization is terminated or revoked.  Performed at [facility], [address] [place], [place] [address]   Blood Culture ID Panel (Reflexed)     Status: Abnormal   Collection Time: 11/07/20  5:58 PM  Result Value Ref Range Status   Enterococcus faecalis NOT DETECTED NOT DETECTED Final   Enterococcus Faecium NOT DETECTED NOT DETECTED Final   Listeria monocytogenes NOT DETECTED NOT DETECTED Final   Staphylococcus species NOT DETECTED NOT DETECTED Final   Staphylococcus aureus (BCID) NOT DETECTED NOT DETECTED Final   Staphylococcus epidermidis NOT DETECTED NOT DETECTED Final   Staphylococcus lugdunensis NOT DETECTED NOT DETECTED Final   Streptococcus species DETECTED (A) NOT DETECTED Final    Comment: CRITICAL RESULT CALLED TO, READ BACK BY AND VERIFIED WITH:  SUSAN WATSON AT 1307 11/08/20 SDR     Streptococcus agalactiae DETECTED (A) NOT DETECTED Final  Comment: CRITICAL RESULT CALLED TO, READ BACK BY AND VERIFIED WITH:  SUSAN WATSON AT 1307 11/08/20 SDR    Streptococcus pneumoniae NOT DETECTED NOT DETECTED Final   Streptococcus pyogenes NOT DETECTED NOT DETECTED Final   A.calcoaceticus-baumannii NOT DETECTED NOT DETECTED Final   Bacteroides fragilis NOT DETECTED NOT DETECTED Final   Enterobacterales NOT DETECTED NOT DETECTED Final   Enterobacter cloacae complex NOT DETECTED NOT DETECTED Final   Escherichia coli NOT DETECTED NOT DETECTED Final   Klebsiella aerogenes NOT DETECTED NOT DETECTED Final   Klebsiella oxytoca NOT DETECTED NOT DETECTED Final   Klebsiella pneumoniae NOT DETECTED NOT DETECTED Final   Proteus species NOT DETECTED NOT DETECTED Final   Salmonella species NOT DETECTED NOT DETECTED Final   Serratia marcescens NOT DETECTED NOT DETECTED Final   Haemophilus influenzae NOT DETECTED NOT DETECTED Final   Neisseria meningitidis NOT DETECTED NOT DETECTED Final   Pseudomonas aeruginosa NOT DETECTED NOT DETECTED Final   Stenotrophomonas maltophilia NOT DETECTED NOT DETECTED Final   Candida albicans NOT DETECTED NOT DETECTED Final   Candida auris NOT DETECTED NOT DETECTED Final   Candida glabrata NOT DETECTED NOT DETECTED Final   Candida krusei NOT DETECTED NOT DETECTED Final   Candida parapsilosis NOT DETECTED NOT DETECTED Final   Candida tropicalis NOT DETECTED NOT DETECTED Final   Cryptococcus neoformans/gattii NOT DETECTED NOT DETECTED Final    Comment: Performed at [facility], [address] [place], [place] [address]  MRSA PCR Screening     Status: None   Collection Time: 11/07/20  8:03 PM   Specimen: Nasopharyngeal  Result Value Ref Range Status   MRSA by PCR NEGATIVE NEGATIVE Final    Comment:        The GeneXpert MRSA Assay (FDA approved for NASAL specimens only), is one component of a comprehensive MRSA colonization surveillance program.  It is not intended to diagnose MRSA infection nor to guide or monitor treatment for MRSA infections. Performed at [facility], [address] [place], [place] [address]   Anaerobic culture     Status: None (Preliminary result)   Collection Time: 11/07/20  8:13 PM   Specimen: PATH Other  Result Value Ref Range Status   Specimen Description   Final    TOE LEFT Performed at [facility], [address] [place], [place] [address]    Special Requests BONE  Final   Gram Stain   Final    RARE WBC PRESENT,BOTH PMN AND MONONUCLEAR MODERATE GRAM POSITIVE COCCI IN PAIRS IN CLUSTERS Performed at [facility], [place] [address], [place], [place] [address]    Culture PENDING  Incomplete   Report Status PENDING  Incomplete  Aerobic/Anaerobic Culture (surgical/deep wound)     Status: None (Preliminary result)   Collection Time: 11/07/20  8:13 PM   Specimen: PATH Other; Abscess  Result Value Ref Range Status   Specimen Description   Final    ABSCESS Performed at [facility], [address], [place], [place] [address]    Special Requests   Final    LEFT FOOT Performed at [facility], [place], [place] [address]    Gram Stain   Final    RARE WBC PRESENT,BOTH PMN AND MONONUCLEAR ABUNDANT GRAM POSITIVE COCCI IN PAIRS IN CLUSTERS    Culture   Final    FEW GROUP B STREP(S.AGALACTIAE)ISOLATED TESTING AGAINST S. AGALACTIAE NOT ROUTINELY PERFORMED DUE TO PREDICTABILITY OF AMP/PEN/VAN SUSCEPTIBILITY. Performed at [facility],  [address], [place], [place] [address]    Report Status PENDING  Incomplete  Aerobic Culture (superficial specimen)     Status: None (Preliminary result)   Collection Time: 11/07/20  8:13 PM   Specimen: PATH Other  Result Value Ref Range Status   Specimen Description   Final    ABSCESS Performed at [facility], [address] [place], [place] [address]    Special Requests LEFT FOOT  BONE  Final   Gram Stain   Final    NO WBC SEEN FEW GRAM POSITIVE COCCI IN PAIRS IN CLUSTERS    Culture   Final    MODERATE GROUP B STREP(S.AGALACTIAE)ISOLATED TESTING AGAINST S. AGALACTIAE NOT ROUTINELY PERFORMED DUE TO PREDICTABILITY OF AMP/PEN/VAN SUSCEPTIBILITY. Performed at [facility], [place] [address] [place], [place] [address]    Report Status PENDING  Incomplete         Radiology Studies: CT LUMBAR SPINE W CONTRAST  Result Date: 11/08/2020 CLINICAL DATA:  Low back pain, infection suspected. 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 Diabetes. Severe low back pain. EXAM: CT LUMBAR SPINE WITH CONTRAST TECHNIQUE: Multidetector CT imaging of the lumbar spine was performed with intravenous contrast administration. CONTRAST:  151m OMNIPAQUE IOHEXOL 300 MG/ML  SOLN COMPARISON:  MRI of lumbar spine 10/15/2018 FINDINGS: Segmentation: 5 non rib-bearing lumbar type vertebral bodies are present. The lowest fully formed vertebral body is L5. Alignment: No significant listhesis is present. Lumbar lordosis is preserved. Vertebrae: Vertebral body heights maintained. No focal lytic or blastic lesions are present. Paraspinal and other soft tissues: Paraspinous soft tissues are unremarkable. No focal inflammatory change or fluid collection is present. Visualized portions the abdomen are unremarkable. Disc levels: T12-L1: Mild facet hypertrophy is present. No significant disc disease or stenosis is present. L1-2: Facet hypertrophy is present. Minimal disc bulging is present. No significant stenosis is present. L2-3: Mild broad-based disc bulge is present. Moderate facet hypertrophy is noted bilaterally. Mild foraminal narrowing is present bilaterally. L3-4: Broad-based disc protrusion is present. Moderate facet hypertrophy has progressed. This results in mild central and bilateral foraminal stenosis. L4-5: Broad-based disc protrusion is present. Moderate facet hypertrophy is noted bilaterally. Moderate central  canal narrowing is worse left than right. Moderate bilateral foraminal narrowing is again seen. L5-S1: Right paramedian disc protrusion is present with calcification. Severe right subarticular stenosis is again seen. Moderate to severe right and moderate left foraminal narrowing is present. SI joints are fused bilaterally. No significant intracanalicular enhancement is evident. IMPRESSION: 1. Progressive multilevel spondylosis of the lumbar spine as described. 2. 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 3. Moderate central canal narrowing at L4-5 is worse left than right. 4. Moderate bilateral foraminal narrowing at L4-5. 5. Progressive mild central and bilateral foraminal stenosis at L3-4. 6. Fusion of the SI joints bilaterally. 7. No focal soft tissue enhancement or fluid collection to suggest infection. If clinical concern persists, MRI more sensitive for evaluation of soft tissues in the spinal canal and foramina. Electronically Signed   By: CSan MorelleM.D.   On: 11/08/2020 15:21        Scheduled Meds: . acetaminophen  650 mg Oral Q6H  . atorvastatin  40 mg Oral Daily  . busPIRone  15 mg Oral BID  . Chlorhexidine Gluconate Cloth  6 each Topical Daily  . dicyclomine  10 mg Oral TID AC & HS  . enoxaparin (LOVENOX) injection  50 mg Subcutaneous Daily  . famotidine  20 mg Oral  BID  . ferrous sulfate  325 mg Oral BID WC  . gabapentin  600 mg Oral QHS  . insulin aspart  0-20 Units Subcutaneous TID AC & HS  . insulin aspart  10 Units Subcutaneous TID WC  . insulin glargine  80 Units Subcutaneous Daily  . lidocaine  1 patch Transdermal Q24H  . methocarbamol  500 mg Oral QID  . metoCLOPramide  10 mg Oral TID AC  . multivitamin with minerals  1 tablet Oral Daily  . pantoprazole  40 mg Oral Daily  . sertraline  100 mg Oral Daily  . vitamin B-12  500 mcg Oral Daily   Continuous Infusions: . sodium chloride 100 mL/hr at 11/10/20 0437  .  ceFEPime (MAXIPIME) IV 2 g (11/10/20 0537)  . lactated ringers    . lactated ringers    . metronidazole 500 mg (11/10/20 0831)  . vancomycin 750 mg (11/10/20 1014)     LOS: 3 days    Time spent: 15 minutes    Sidney Ace, MD [facility] Pager [phone]  If 7PM-7AM, please contact night-coverage 11/10/2020, 10:26 AM

## 2020-11-10 NOTE — Progress Notes (Signed)
3 Days Post-Op   Subjective/Chief Complaint: Patient seen.  Still having a fair amount of pain in his right foot.  States he went for his MRI earlier.   Objective: Vital signs in last 24 hours: Temp:  [97.9 F (36.6 C)-98 F (36.7 C)] 97.9 F (36.6 C) (12/23 0758) Pulse Rate:  [71-79] 72 (12/23 0758) Resp:  [16-18] 16 (12/23 0758) BP: (112-157)/(73-94) 157/94 (12/23 0758) SpO2:  [98 %-100 %] 98 % (12/23 0758) Weight:  [102.8 kg] 102.8 kg (12/23 0302) Last BM Date: 11/04/20  Intake/Output from previous day: 12/22 0701 - 12/23 0700 In: 1780.2 [P.O.:240; I.V.:840.1; IV Piggyback:700.1] Out: 2200 [Urine:2200] Intake/Output this shift: Total I/O In: -  Out: 1150 [Urine:1150]  Still heavy drainage noted on the bandaging.  Upon removal plantar skin continues to show areas of necrosis.  Some increased erythema along the medial arch area.  MRI read and images were reviewed by myself.  There does appear to be residual osteomyelitis in the third metatarsal which was also noted on the pathology report.  Also concern for osteomyelitis in the second and fourth metatarsals.  Upon review of the images I think that there is definitely some bone infection in the second and fourth.    Lab Results:  Recent Labs    11/08/20 1206 11/09/20 0316  WBC 12.3* 11.7*  HGB 7.9* 7.8*  HCT 24.9* 24.8*  PLT 250 244   BMET Recent Labs    11/08/20 1206 11/09/20 0316  NA 130* 134*  K 2.9* 3.2*  CL 96* 100  CO2 25 26  GLUCOSE 167* 201*  BUN 12 12  CREATININE 1.35* 1.24  CALCIUM 7.9* 7.6*   PT/INR Recent Labs    11/07/20 1758  LABPROT 14.8  INR 1.2   ABG No results for input(s): PHART, HCO3 in the last 72 hours.  Invalid input(s): PCO2, PO2  Studies/Results: MR FOOT LEFT W WO CONTRAST  Result Date: 11/10/2020 CLINICAL DATA:  Diabetic left foot wound. Left third toe amputation with partial ray resection with wound debridement on 11/07/2020. EXAM: MRI OF THE LEFT FOREFOOT WITHOUT AND  WITH CONTRAST TECHNIQUE: Multiplanar, multisequence MR imaging of the left forefoot was performed both before and after administration of intravenous contrast. CONTRAST:  76mL GADAVIST GADOBUTROL 1 MMOL/ML IV SOLN COMPARISON:  X-ray 11/07/2020 FINDINGS: Bones/Joint/Cartilage Interval third toe amputation with partial third ray resection at the level of the third metatarsal neck. There is marrow edema throughout the residual third metatarsal diaphysis with preservation of the fatty T1 marrow signal, likely reactive. There is marrow edema and enhancement with low T1 marrow signal changes along the lateral margin of the second metatarsal head (series 7, images 15-16; series 3, image 23) suspicious for osteomyelitis. There is mild marrow edema along the base of the second toe proximal phalanx as well as the fourth metatarsal head and base of fourth toe proximal phalanx without definite cortical destruction or low T1 marrow signal changes. Findings may reflect reactive osteitis. Early acute osteomyelitis at these locations is not excluded. Small second and fourth MTP joint effusions. Remaining osseous structures of the forefoot are within normal limits. No fractures. No dislocation. Ligaments Intact Lisfranc ligament. Collateral ligaments of the remaining metatarsophalangeal joints appear intact. Muscles and Tendons Diffuse edema-like signal throughout the intrinsic foot musculature suggestive of a myositis. No tenosynovial fluid collections. Soft tissues Surgical defect at the site of third toe amputation with ill-defined fluid and air within the surgical bed. There is a tract of air and fluid with  peripherally enhancing margins along the plantar aspect of the forefoot extending from the level of the third metatarsal head and propagating proximally and medially to the level of the midfoot, likely site of surgical incision/debridement (series 14, images 14-20). Otherwise, there are no organized or rim enhancing fluid  collections. IMPRESSION: 1. Interval third toe amputation with partial third ray resection at the level of the third metatarsal neck. Marrow edema throughout the residual third metatarsal diaphysis is likely reactive. 2. Marrow edema and enhancement along the lateral margin of the second metatarsal head is suspicious for acute osteomyelitis. 3. Mild marrow edema along the base of the second toe proximal phalanx as well as the fourth metatarsal head and base of the fourth toe proximal phalanx without definite cortical destruction or marrow replacement. Findings may reflect reactive osteitis. Early acute osteomyelitis at these locations is not excluded. 4. Small second and fourth MTP joint effusions, which may be reactive or reflect septic arthritis. 5. Extensive postsurgical changes from debridement of the plantar soft tissues of the left forefoot. No organized fluid collection or abscess. 6. Diffuse edema-like signal throughout the intrinsic foot musculature suggestive of a myositis. Electronically Signed   By: Duanne Guess D.O.   On: 11/10/2020 12:58    Anti-infectives: Anti-infectives (From admission, onward)   Start     Dose/Rate Route Frequency Ordered Stop   11/09/20 0600  ceFEPIme (MAXIPIME) 2 g in sodium chloride 0.9 % 100 mL IVPB        2 g 200 mL/hr over 30 Minutes Intravenous Every 8 hours 11/09/20 0009     11/09/20 0000  metroNIDAZOLE (FLAGYL) IVPB 500 mg        500 mg 100 mL/hr over 60 Minutes Intravenous Every 8 hours 11/08/20 1954     11/08/20 2200  ceFEPIme (MAXIPIME) 2 g in sodium chloride 0.9 % 100 mL IVPB  Status:  Discontinued        2 g 200 mL/hr over 30 Minutes Intravenous Every 12 hours 11/08/20 1952 11/09/20 0009   11/08/20 1800  vancomycin (VANCOREADY) IVPB 1250 mg/250 mL  Status:  Discontinued        1,250 mg 166.7 mL/hr over 90 Minutes Intravenous Every 24 hours 11/07/20 1939 11/08/20 1258   11/08/20 1800  Ampicillin-Sulbactam (UNASYN) 3 g in sodium chloride 0.9 % 100  mL IVPB  Status:  Discontinued        3 g 200 mL/hr over 30 Minutes Intravenous Every 6 hours 11/08/20 1636 11/08/20 1951   11/08/20 1400  vancomycin (VANCOREADY) IVPB 750 mg/150 mL  Status:  Discontinued        750 mg 150 mL/hr over 60 Minutes Intravenous Every 12 hours 11/08/20 1258 11/10/20 1030   11/08/20 0800  piperacillin-tazobactam (ZOSYN) IVPB 3.375 g  Status:  Discontinued        3.375 g 12.5 mL/hr over 240 Minutes Intravenous Every 8 hours 11/07/20 1939 11/08/20 1636   11/07/20 2200  clindamycin (CLEOCIN) IVPB 600 mg  Status:  Discontinued        600 mg 100 mL/hr over 30 Minutes Intravenous Every 8 hours 11/07/20 1924 11/08/20 0834   11/07/20 1930  vancomycin (VANCOCIN) IVPB 1000 mg/200 mL premix  Status:  Discontinued        1,000 mg 200 mL/hr over 60 Minutes Intravenous  Once 11/07/20 1924 11/07/20 2244   11/07/20 1930  piperacillin-tazobactam (ZOSYN) IVPB 3.375 g  Status:  Discontinued        3.375 g 100 mL/hr over 30  Minutes Intravenous  Once 11/07/20 1924 11/08/20 1636   11/07/20 1815  vancomycin (VANCOREADY) IVPB 2000 mg/400 mL  Status:  Discontinued        2,000 mg 200 mL/hr over 120 Minutes Intravenous STAT 11/07/20 1801 11/07/20 1802   11/07/20 1815  vancomycin (VANCOCIN) IVPB 1000 mg/200 mL premix  Status:  Discontinued        1,000 mg 200 mL/hr over 60 Minutes Intravenous STAT 11/07/20 1802 11/08/20 1258   11/07/20 1800  ceFEPIme (MAXIPIME) 2 g in sodium chloride 0.9 % 100 mL IVPB        2 g 200 mL/hr over 30 Minutes Intravenous STAT 11/07/20 1757 11/07/20 2303      Assessment/Plan: s/p Procedure(s): INCISION AND DRAINAGE (Left) 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 (Left) Assessment: 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   Plan: Sterile dressing reapplied to the left foot without removing the packing today.  Discussed with the patient that due to the infection in his metatarsals that at this point I do not think  that he has a salvageable foot.  I did discuss this with Dr. Gilda Crease who agrees.  At this point we will consult vascular surgery to discuss below-knee amputation.  Most likely podiatry will go ahead and sign off on this patient.  LOS: 3 days    Ricci Barker 11/10/2020

## 2020-11-10 NOTE — Progress Notes (Signed)
ID Pt doing okay Still with pain Wound left foot with worsenign necrosis  Patient Vitals for the past 24 hrs:  BP Temp Temp src Pulse Resp SpO2 Weight  11/10/20 1640 (!) 168/89 98.3 F (36.8 C) Oral 92 17 100 % --  11/10/20 0758 (!) 157/94 97.9 F (36.6 C) Oral 72 16 98 % --  11/10/20 0342 124/80 97.9 F (36.6 C) Oral 79 -- 100 % --  11/10/20 0302 -- -- -- -- -- -- 102.8 kg   On examination awake and alert Chest bilateral air entry Heart sounds S1-S2 Abdomen soft CNS grossly nonfocal Left foot wound picture inspected 11/10/20 Erythema at the edges, some necrosis at the surgical site   11/08/20    CBC Latest Ref Rng & Units 11/09/2020 11/08/2020 11/08/2020  WBC 4.0 - 10.5 K/uL 11.7(H) 12.3(H) 11.8(H)  Hemoglobin 13.0 - 17.0 g/dL 7.8(L) 7.9(L) 8.0(L)  Hematocrit 39.0 - 52.0 % 24.8(L) 24.9(L) 25.0(L)  Platelets 150 - 400 K/uL 244 250 266    CMP Latest Ref Rng & Units 11/09/2020 11/08/2020 11/08/2020  Glucose 70 - 99 mg/dL 201(H) 167(H) 168(H)  BUN 6 - 20 mg/dL _0 Creatinine 0.61 - 1.24 mg/dL 1.24 1.35(H) 1.34(H)  Sodium 135 - 145 mmol/L 134(L) 130(L) 129(L)  Potassium 3.5 - 5.1 mmol/L 3.2(L) 2.9(L) 2.7(LL)  Chloride 98 - 111 mmol/L 100 96(L) 95(L)  CO2 22 - 32 mmol/L _1 Calcium 8.9 - 10.3 mg/dL 7.6(L) 7.9(L) 7.8(L)  Total Protein 6.5 - 8.1 g/dL - - -  Total Bilirubin 0.3 - 1.2 mg/dL - - -  Alkaline Phos 38 - 126 U/L - - -  AST 15 - 41 U/L - - -  ALT 0 - 44 U/L - - -    Micro GBS strep bacteremia GBS strep in wound culture with anerobes  Impression/recommendation Necrotizing infection of the left foot with gangrene of the third toe.  Status post amputation of the third toe.  Also underwent incision and drainage of the multiple abscess in the plantar aspect.  Cultures group B streptococcus and anaerobes MRI of the foot done today shows marrow edema and enhancement along the lateral margin of the second metatarsal head, proximal phalanx, fourth  metatarsal head and base of the fourth toe suggestive of acute osteomyelitis.  Podiatrist think that because of necrosis and the bone changes he likely is going to need BKA.  Vascular surgeon has been consulted.  Group B streptococcus bacteremia.  We will change cefepime to ceftriaxone.  Once the culture is finalized may be able to change reaction and Flagyl to Unasyn.  Diabetes mellitus poorly controlled.  Presented with hyperosmolar hyperglycemia.  Latest hemoglobin A1c is 11.  He is on insulin  CKD.  Baseline creatinine is around 1.60.  It is better now and its the last 1 was 1.24.  Vancomycin has been stopped.  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  After many months of repeated infections in 2019 and 2020 needing multiple courses of antibiotics including vancomycin and  then daptomycin the foot has healed completely.  Discussed the management with the patient and his wife. 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  [phone].

## 2020-11-11 ENCOUNTER — Inpatient Hospital Stay: Payer: Medicaid Other | Admitting: Anesthesiology

## 2020-11-11 ENCOUNTER — Encounter
Admission: EM | Disposition: A | Discharge status: Home Health | Payer: Self-pay | Source: Home / Self Care | Attending: Internal Medicine

## 2020-11-11 DIAGNOSIS — A419 Sepsis, unspecified organism: Secondary | ICD-10-CM

## 2020-11-11 DIAGNOSIS — L089 Local infection of the skin and subcutaneous tissue, unspecified: Secondary | ICD-10-CM

## 2020-11-11 DIAGNOSIS — M726 Necrotizing fasciitis: Secondary | ICD-10-CM | POA: Diagnosis not present

## 2020-11-11 DIAGNOSIS — R739 Hyperglycemia, unspecified: Secondary | ICD-10-CM

## 2020-11-11 DIAGNOSIS — E11628 Type 2 diabetes mellitus with other skin complications: Secondary | ICD-10-CM

## 2020-11-11 HISTORY — PX: IRRIGATION AND DEBRIDEMENT FOOT: SHX6602

## 2020-11-11 LAB — CBC WITH DIFFERENTIAL/PLATELET
Abs Immature Granulocytes: 0.27 10*3/uL — ABNORMAL HIGH (ref 0.00–0.07)
Basophils Absolute: 0 10*3/uL (ref 0.0–0.1)
Basophils Relative: 0 %
Eosinophils Absolute: 0.1 10*3/uL (ref 0.0–0.5)
Eosinophils Relative: 1 %
HCT: 25.7 % — ABNORMAL LOW (ref 39.0–52.0)
Hemoglobin: 8.2 g/dL — ABNORMAL LOW (ref 13.0–17.0)
Immature Granulocytes: 2 %
Lymphocytes Relative: 13 %
Lymphs Abs: 1.8 10*3/uL (ref 0.7–4.0)
MCH: 26.9 pg (ref 26.0–34.0)
MCHC: 31.9 g/dL (ref 30.0–36.0)
MCV: 84.3 fL (ref 80.0–100.0)
Monocytes Absolute: 1 10*3/uL (ref 0.1–1.0)
Monocytes Relative: 7 %
Neutro Abs: 11.3 10*3/uL — ABNORMAL HIGH (ref 1.7–7.7)
Neutrophils Relative %: 77 %
Platelets: 279 10*3/uL (ref 150–400)
RBC: 3.05 MIL/uL — ABNORMAL LOW (ref 4.22–5.81)
RDW: 15.4 % (ref 11.5–15.5)
WBC: 14.6 10*3/uL — ABNORMAL HIGH (ref 4.0–10.5)
nRBC: 0 % (ref 0.0–0.2)

## 2020-11-11 LAB — CULTURE, BLOOD (ROUTINE X 2)

## 2020-11-11 LAB — BASIC METABOLIC PANEL
Anion gap: 10 (ref 5–15)
BUN: 12 mg/dL (ref 6–20)
CO2: 23 mmol/L (ref 22–32)
Calcium: 7.7 mg/dL — ABNORMAL LOW (ref 8.9–10.3)
Chloride: 100 mmol/L (ref 98–111)
Creatinine, Ser: 1.19 mg/dL (ref 0.61–1.24)
GFR, Estimated: >60 mL/min (ref >60)
Glucose, Bld: 184 mg/dL — ABNORMAL HIGH (ref 70–99)
Potassium: 3.5 mmol/L (ref 3.5–5.1)
Sodium: 133 mmol/L — ABNORMAL LOW (ref 135–145)

## 2020-11-11 LAB — GLUCOSE, CAPILLARY
Glucose-Capillary: 182 mg/dL — ABNORMAL HIGH (ref 70–99)
Glucose-Capillary: 181 mg/dL — ABNORMAL HIGH (ref 70–99)
Glucose-Capillary: 75 mg/dL (ref 70–99)
Glucose-Capillary: 70 mg/dL (ref 70–99)
Glucose-Capillary: 67 mg/dL — ABNORMAL LOW (ref 70–99)
Glucose-Capillary: 83 mg/dL (ref 70–99)

## 2020-11-11 LAB — C-REACTIVE PROTEIN: CRP: 12.2 mg/dL — ABNORMAL HIGH (ref <1.0)

## 2020-11-11 LAB — SEDIMENTATION RATE: Sed Rate: 125 mm/hr — ABNORMAL HIGH (ref 0–16)

## 2020-11-11 SURGERY — IRRIGATION AND DEBRIDEMENT FOOT
Anesthesia: General | Site: Foot | Laterality: Left

## 2020-11-11 MED ORDER — ONDANSETRON HCL 4 MG/2ML IJ SOLN
INTRAMUSCULAR | Status: AC
  Filled 2020-11-11: qty 2

## 2020-11-11 MED ORDER — SODIUM CHLORIDE 0.9 % IV SOLN
2.0000 g | INTRAVENOUS | Status: DC
  Administered 2020-11-11: 2 g via INTRAVENOUS
  Filled 2020-11-11: qty 20
  Filled 2020-11-11: qty 2

## 2020-11-11 MED ORDER — MIDAZOLAM HCL 2 MG/2ML IJ SOLN
INTRAMUSCULAR | Status: AC
  Filled 2020-11-11: qty 2

## 2020-11-11 MED ORDER — FENTANYL CITRATE (PF) 100 MCG/2ML IJ SOLN
25.0000 ug | INTRAMUSCULAR | Status: DC | PRN
Start: 2020-11-11 — End: 2020-11-12
  Administered 2020-11-11: 50 ug via INTRAVENOUS

## 2020-11-11 MED ORDER — VANCOMYCIN HCL 1000 MG IV SOLR
INTRAVENOUS | Status: DC | PRN
  Administered 2020-11-11: 2000 mg

## 2020-11-11 MED ORDER — DEXMEDETOMIDINE (PRECEDEX) IN NS 20 MCG/5ML (4 MCG/ML) IV SYRINGE
PREFILLED_SYRINGE | INTRAVENOUS | Status: AC
  Filled 2020-11-11: qty 5

## 2020-11-11 MED ORDER — BUPIVACAINE HCL (PF) 0.5 % IJ SOLN
INTRAMUSCULAR | Status: AC
  Filled 2020-11-11: qty 30

## 2020-11-11 MED ORDER — ONDANSETRON HCL 4 MG/2ML IJ SOLN
INTRAMUSCULAR | Status: DC | PRN
  Administered 2020-11-11: 4 mg via INTRAVENOUS

## 2020-11-11 MED ORDER — FENTANYL CITRATE (PF) 100 MCG/2ML IJ SOLN
INTRAMUSCULAR | Status: AC
  Filled 2020-11-11: qty 2

## 2020-11-11 MED ORDER — DEXAMETHASONE SODIUM PHOSPHATE 10 MG/ML IJ SOLN
INTRAMUSCULAR | Status: DC | PRN
  Administered 2020-11-11: 5 mg via INTRAVENOUS

## 2020-11-11 MED ORDER — LIDOCAINE HCL (CARDIAC) PF 100 MG/5ML IV SOSY
PREFILLED_SYRINGE | INTRAVENOUS | Status: DC | PRN
  Administered 2020-11-11: 100 mg via INTRAVENOUS

## 2020-11-11 MED ORDER — DEXTROSE 50 % IV SOLN
25.0000 mL | INTRAVENOUS | Status: AC
  Administered 2020-11-11: 25 mL via INTRAVENOUS

## 2020-11-11 MED ORDER — DEXAMETHASONE SODIUM PHOSPHATE 10 MG/ML IJ SOLN
INTRAMUSCULAR | Status: AC
  Filled 2020-11-11: qty 1

## 2020-11-11 MED ORDER — METHOCARBAMOL 500 MG PO TABS
750.0000 mg | ORAL_TABLET | Freq: Four times a day (QID) | ORAL | Status: DC
  Administered 2020-11-12 – 2020-11-21 (×37): 750 mg via ORAL
  Filled 2020-11-11 (×3): qty 2
  Filled 2020-11-11: qty 1
  Filled 2020-11-11: qty 2
  Filled 2020-11-11 (×2): qty 1
  Filled 2020-11-11 (×5): qty 2
  Filled 2020-11-11 (×3): qty 1
  Filled 2020-11-11: qty 2
  Filled 2020-11-11: qty 1
  Filled 2020-11-11: qty 2
  Filled 2020-11-11: qty 1
  Filled 2020-11-11: qty 2
  Filled 2020-11-11: qty 1
  Filled 2020-11-11: qty 2
  Filled 2020-11-11: qty 1
  Filled 2020-11-11: qty 2
  Filled 2020-11-11: qty 1
  Filled 2020-11-11 (×2): qty 2
  Filled 2020-11-11: qty 1
  Filled 2020-11-11: qty 2
  Filled 2020-11-11 (×2): qty 1
  Filled 2020-11-11 (×3): qty 2
  Filled 2020-11-11: qty 1
  Filled 2020-11-11: qty 2
  Filled 2020-11-11: qty 1
  Filled 2020-11-11: qty 2
  Filled 2020-11-11 (×2): qty 1
  Filled 2020-11-11 (×2): qty 2
  Filled 2020-11-11: qty 1
  Filled 2020-11-11: qty 2

## 2020-11-11 MED ORDER — DEXTROSE 50 % IV SOLN
INTRAVENOUS | Status: AC
  Filled 2020-11-11: qty 50

## 2020-11-11 MED ORDER — FENTANYL CITRATE (PF) 100 MCG/2ML IJ SOLN
INTRAMUSCULAR | Status: DC | PRN
  Administered 2020-11-11 (×4): 50 ug via INTRAVENOUS

## 2020-11-11 MED ORDER — ONDANSETRON HCL 4 MG/2ML IJ SOLN: 4.0000 mg | Freq: Once | INTRAMUSCULAR | Status: DC | PRN

## 2020-11-11 MED ORDER — HYDROMORPHONE HCL 1 MG/ML IJ SOLN
INTRAMUSCULAR | Status: AC
  Administered 2020-11-11: 0.5 mg via INTRAVENOUS
  Filled 2020-11-11: qty 1

## 2020-11-11 MED ORDER — PROPOFOL 10 MG/ML IV BOLUS
INTRAVENOUS | Status: AC
  Filled 2020-11-11: qty 20

## 2020-11-11 MED ORDER — ACETAMINOPHEN 10 MG/ML IV SOLN
INTRAVENOUS | Status: DC | PRN
  Administered 2020-11-11: 1000 mg via INTRAVENOUS

## 2020-11-11 MED ORDER — SODIUM CHLORIDE 0.9 % IV SOLN
3.0000 g | Freq: Four times a day (QID) | INTRAVENOUS | Status: DC
  Administered 2020-11-11 – 2020-11-21 (×37): 3 g via INTRAVENOUS
  Filled 2020-11-11 (×3): qty 3
  Filled 2020-11-11: qty 8
  Filled 2020-11-11 (×2): qty 3
  Filled 2020-11-11 (×2): qty 8
  Filled 2020-11-11: qty 3
  Filled 2020-11-11 (×2): qty 8
  Filled 2020-11-11: qty 3
  Filled 2020-11-11 (×3): qty 8
  Filled 2020-11-11: qty 3
  Filled 2020-11-11 (×2): qty 8
  Filled 2020-11-11 (×2): qty 3
  Filled 2020-11-11 (×3): qty 8
  Filled 2020-11-11 (×8): qty 3
  Filled 2020-11-11 (×2): qty 8
  Filled 2020-11-11: qty 3
  Filled 2020-11-11: qty 8
  Filled 2020-11-11: qty 3
  Filled 2020-11-11: qty 8
  Filled 2020-11-11: qty 3
  Filled 2020-11-11: qty 8
  Filled 2020-11-11: qty 3
  Filled 2020-11-11 (×4): qty 8

## 2020-11-11 MED ORDER — PHENYLEPHRINE HCL (PRESSORS) 10 MG/ML IV SOLN
INTRAVENOUS | Status: DC | PRN
  Administered 2020-11-11: 200 ug via INTRAVENOUS
  Administered 2020-11-11 (×2): 100 ug via INTRAVENOUS
  Administered 2020-11-11: 200 ug via INTRAVENOUS
  Administered 2020-11-11 (×3): 100 ug via INTRAVENOUS
  Administered 2020-11-11: 200 ug via INTRAVENOUS

## 2020-11-11 MED ORDER — BUPIVACAINE HCL (PF) 0.5 % IJ SOLN
INTRAMUSCULAR | Status: DC | PRN
  Administered 2020-11-11: 18 mL

## 2020-11-11 MED ORDER — OXYCODONE HCL 5 MG PO TABS: 5.0000 mg | ORAL_TABLET | Freq: Once | ORAL | Status: DC | PRN

## 2020-11-11 MED ORDER — DEXMEDETOMIDINE HCL 200 MCG/2ML IV SOLN
INTRAVENOUS | Status: DC | PRN
  Administered 2020-11-11: 8 ug via INTRAVENOUS

## 2020-11-11 MED ORDER — LIDOCAINE HCL (PF) 2 % IJ SOLN
INTRAMUSCULAR | Status: AC
  Filled 2020-11-11: qty 5

## 2020-11-11 MED ORDER — FENTANYL CITRATE (PF) 100 MCG/2ML IJ SOLN
INTRAMUSCULAR | Status: AC
  Administered 2020-11-11: 50 ug via INTRAVENOUS
  Filled 2020-11-11: qty 2

## 2020-11-11 MED ORDER — OXYCODONE HCL 5 MG/5ML PO SOLN: 5.0000 mg | Freq: Once | ORAL | Status: DC | PRN

## 2020-11-11 MED ORDER — HYDROMORPHONE HCL 1 MG/ML IJ SOLN
0.5000 mg | INTRAMUSCULAR | Status: DC | PRN
Start: 2020-11-11 — End: 2020-11-12
  Administered 2020-11-11: 0.5 mg via INTRAVENOUS

## 2020-11-11 MED ORDER — ACETAMINOPHEN 10 MG/ML IV SOLN: 1000.0000 mg | Freq: Once | INTRAVENOUS | Status: DC | PRN

## 2020-11-11 MED ORDER — PROPOFOL 10 MG/ML IV BOLUS
INTRAVENOUS | Status: DC | PRN
  Administered 2020-11-11: 50 mg via INTRAVENOUS

## 2020-11-11 MED ORDER — SODIUM CHLORIDE 0.9 % IV SOLN
INTRAVENOUS | Status: DC | PRN
Start: 2020-11-11 — End: 2020-11-11

## 2020-11-11 MED ORDER — ACETAMINOPHEN 10 MG/ML IV SOLN
INTRAVENOUS | Status: AC
  Filled 2020-11-11: qty 100

## 2020-11-11 MED ORDER — MIDAZOLAM HCL 2 MG/2ML IJ SOLN
INTRAMUSCULAR | Status: DC | PRN
  Administered 2020-11-11: 2 mg via INTRAVENOUS

## 2020-11-11 SURGICAL SUPPLY — 59 items
"PENCIL ELECTRO HAND CTR " (MISCELLANEOUS) ×1 IMPLANT
BLADE OSC/SAGITTAL MD 5.5X18 (BLADE) ×1 IMPLANT
BLADE OSCILLATING/SAGITTAL (BLADE) ×2
BLADE SURG 15 STRL LF DISP TIS (BLADE) ×1 IMPLANT
BLADE SURG 15 STRL SS (BLADE) ×2
BLADE SURG MINI STRL (BLADE) IMPLANT
BLADE SW THK.38XMED LNG THN (BLADE) IMPLANT
BNDG CMPR STD VLCR NS LF 5.8X4 (GAUZE/BANDAGES/DRESSINGS) ×1
BNDG CONFORM 2 STRL LF (GAUZE/BANDAGES/DRESSINGS) ×2 IMPLANT
BNDG ELASTIC 4X5.8 VLCR NS LF (GAUZE/BANDAGES/DRESSINGS) ×2 IMPLANT
BNDG ESMARK 4X12 TAN STRL LF (GAUZE/BANDAGES/DRESSINGS) ×2 IMPLANT
BNDG GAUZE 4.5X4.1 6PLY STRL (MISCELLANEOUS) ×3 IMPLANT
CANISTER SUCT 1200ML W/VALVE (MISCELLANEOUS) ×2 IMPLANT
COVER WAND RF STERILE (DRAPES) ×2 IMPLANT
CUFF TOURN SGL QUICK 12 (TOURNIQUET CUFF) IMPLANT
CUFF TOURN SGL QUICK 18X4 (TOURNIQUET CUFF) ×1 IMPLANT
DRAPE FLUOR MINI C-ARM 54X84 (DRAPES) IMPLANT
DRSG MEPITEL 4X7.2 (GAUZE/BANDAGES/DRESSINGS) ×1 IMPLANT
DURAPREP 26ML APPLICATOR (WOUND CARE) ×2 IMPLANT
ELECT REM PT RETURN 9FT ADLT (ELECTROSURGICAL) ×2
ELECTRODE REM PT RTRN 9FT ADLT (ELECTROSURGICAL) ×1 IMPLANT
GAUZE SPONGE 4X4 12PLY STRL (GAUZE/BANDAGES/DRESSINGS) ×3 IMPLANT
GAUZE XEROFORM 1X8 LF (GAUZE/BANDAGES/DRESSINGS) ×2 IMPLANT
GLOVE BIO SURGEON STRL SZ7.5 (GLOVE) ×2 IMPLANT
GLOVE INDICATOR 8.0 STRL GRN (GLOVE) ×2 IMPLANT
GOWN STRL REUS W/ TWL LRG LVL3 (GOWN DISPOSABLE) ×2 IMPLANT
GOWN STRL REUS W/TWL LRG LVL3 (GOWN DISPOSABLE) ×4
HANDPIECE VERSAJET DEBRIDEMENT (MISCELLANEOUS) ×2 IMPLANT
KIT STIMULAN RAPID CURE 5CC (Orthopedic Implant) ×2 IMPLANT
KIT TURNOVER KIT A (KITS) ×2 IMPLANT
LABEL OR SOLS (LABEL) ×2 IMPLANT
MANIFOLD NEPTUNE II (INSTRUMENTS) ×2 IMPLANT
NDL FILTER BLUNT 18X1 1/2 (NEEDLE) ×1 IMPLANT
NDL HYPO 25X1 1.5 SAFETY (NEEDLE) ×3 IMPLANT
NEEDLE FILTER BLUNT 18X 1/2SAF (NEEDLE) ×1
NEEDLE FILTER BLUNT 18X1 1/2 (NEEDLE) ×1 IMPLANT
NEEDLE HYPO 25X1 1.5 SAFETY (NEEDLE) ×6 IMPLANT
NS IRRIG 500ML POUR BTL (IV SOLUTION) ×2 IMPLANT
PACK EXTREMITY ARMC (MISCELLANEOUS) ×2 IMPLANT
PAD ABD DERMACEA PRESS 5X9 (GAUZE/BANDAGES/DRESSINGS) ×7 IMPLANT
PENCIL ELECTRO HAND CTR (MISCELLANEOUS) ×2 IMPLANT
PULSAVAC PLUS IRRIG FAN TIP (DISPOSABLE) ×2
RASP SM TEAR CROSS CUT (RASP) IMPLANT
SOL PREP PVP 2OZ (MISCELLANEOUS) ×2
SOLUTION PREP PVP 2OZ (MISCELLANEOUS) ×1 IMPLANT
SPONGE LAP 18X18 RF (DISPOSABLE) ×1 IMPLANT
STAPLER SKIN PROX 35W (STAPLE) ×1 IMPLANT
STOCKINETTE STRL 6IN 960660 (GAUZE/BANDAGES/DRESSINGS) ×2 IMPLANT
SUT ETHILON 3-0 FS-10 30 BLK (SUTURE) ×4
SUT ETHILON 4-0 (SUTURE) ×2
SUT ETHILON 4-0 FS2 18XMFL BLK (SUTURE) ×1
SUT VIC AB 3-0 SH 27 (SUTURE) ×2
SUT VIC AB 3-0 SH 27X BRD (SUTURE) ×1 IMPLANT
SUT VIC AB 4-0 FS2 27 (SUTURE) ×1 IMPLANT
SUTURE EHLN 3-0 FS-10 30 BLK (SUTURE) ×1 IMPLANT
SUTURE ETHLN 4-0 FS2 18XMF BLK (SUTURE) ×1 IMPLANT
SYR 10ML LL (SYRINGE) ×4 IMPLANT
SYR 3ML LL SCALE MARK (SYRINGE) ×2 IMPLANT
TIP FAN IRRIG PULSAVAC PLUS (DISPOSABLE) IMPLANT

## 2020-11-11 NOTE — Consult Note (Signed)
_0 @   MRN : [id number]  Cliffton Spradley is a 44 y.o. ([birth year]) male who presents with chief complaint of No chief complaint on file. Marland Kitchen  History of Present Illness:  I am asked to evaluate the patient by Dr. Cleda Mccreedy.  Patient is a 44 year old gentleman who was admitted to [facility] on 11/07/2020 with a left diabetic foot abscess.  He was subsequently seen by Dr. Caryl Comes in consult that same day and underwent extensive debridement with drainage of the infection.  The damage to his left foot was quite extensive and there is concern that his foot will not be salvageable.  I have asked to evaluate for possible amputation.  Today the patient denies fever chills.  He states his foot is feeling much better.  He does not have a history of claudication.  There is no history of rest pain or prior vascular interventions.  He does have history of right diabetic foot infection which resulted in a toe amputation.  Current Facility-Administered Medications for the 11/07/20 encounter [facility] Encounter)  Medication  . DAPTOmycin (CUBICIN) 780 mg in sodium chloride 0.9 % IVPB   Current Meds  Medication Sig  . atorvastatin (LIPITOR) 40 MG tablet Take 40 mg by mouth daily.   . busPIRone (BUSPAR) 15 MG tablet Take 15 mg by mouth 2 (two) times daily.  Marland Kitchen dicyclomine (BENTYL) 10 MG capsule Take 10 mg by mouth in the morning and at bedtime.  . famotidine (PEPCID) 20 MG tablet Take 20 mg by mouth daily.  . fenofibrate (TRICOR) 48 MG tablet Take 48 mg by mouth daily.  . ferrous sulfate 325 (65 FE) MG tablet Take 325 mg by mouth 2 (two) times daily with a meal.  . FIASP 100 UNIT/ML SOLN Inject into the skin continuous. Via pump  . furosemide (LASIX) 40 MG tablet Take 40 mg by mouth daily.  Marland Kitchen linaclotide (LINZESS) 72 MCG capsule Take 72 mcg by mouth daily as needed (constipation).  Marland Kitchen lisinopril (PRINIVIL,ZESTRIL) 5 MG tablet Take 5 mg by mouth daily.  Marland Kitchen MAGNESIUM PO Take 1 tablet by mouth  daily.  . metoCLOPramide (REGLAN) 10 MG tablet Take 10 mg by mouth 3 (three) times daily as needed for nausea or vomiting.  . ondansetron (ZOFRAN) 8 MG tablet Take 8 mg by mouth every 6 (six) hours as needed for nausea/vomiting.  Marland Kitchen oxyCODONE-acetaminophen (PERCOCET/ROXICET) 5-325 MG tablet Take 1 tablet by mouth in the morning, at noon, in the evening, and at bedtime.  . pantoprazole (PROTONIX) 40 MG tablet Take 40 mg by mouth daily.  . pioglitazone (ACTOS) 30 MG tablet Take 30 mg by mouth daily.   Marland Kitchen POTASSIUM PO Take 1 capsule by mouth in the morning and at bedtime.  . sertraline (ZOLOFT) 100 MG tablet Take 100 mg by mouth daily.   . sildenafil (REVATIO) 20 MG tablet Take 100 mg by mouth daily as needed (erectile dysfunction).  . traZODone (DESYREL) 50 MG tablet Take 50 mg by mouth at bedtime.  . vitamin B-12 (CYANOCOBALAMIN) 1000 MCG tablet Take 1,000 mcg by mouth in the morning and at bedtime.     Past Medical History:  Diagnosis Date  . DIABETES MELLITUS, TYPE II, UNCONTROLLED 03/17/2009  . DM 12/08/2008  . HYPERLIPIDEMIA 03/17/2009  . HYPERTENSION 12/08/2008  . 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 AMPUTATION Left 11/07/2020   Procedure: 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;  Surgeon: Sharlotte Alamo, DPM;  Location: [facility];  Service: Podiatry;  Laterality: Left;  . ANTERIOR CERVICAL DECOMP/DISCECTOMY FUSION N/A 09/09/2017   Procedure: ANTERIOR CERVICAL DECOMPRESSION/DISCECTOMY FUSION CERVICAL 6- CERVICAL 7;  Surgeon: Ashok Pall, MD;  Location: [place];  Service: Neurosurgery;  Laterality: N/A;  ANTERIOR CERVICAL DECOMPRESSION/DISCECTOMY FUSION CERVICAL 6- CERVICAL 7  . APPENDECTOMY    . I & D EXTREMITY Right 10/03/2017   Procedure: IRRIGATION AND DEBRIDEMENT RIGHT WRIST;  Surgeon: Leanora Cover, MD;  Location: [place];  Service: Orthopedics;  Laterality: Right;  . I & D EXTREMITY Right 11/26/2018   Procedure: IRRIGATION AND  DEBRIDEMENT FASCIA ON RIGHT FOOT;  Surgeon: Samara Deist, DPM;  Location: [facility];  Service: Podiatry;  Laterality: Right;  . 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 INCISION AND DRAINAGE Left 11/07/2020   Procedure: INCISION AND DRAINAGE;  Surgeon: Sharlotte Alamo, DPM;  Location: [facility];  Service: Podiatry;  Laterality: Left;  . METATARSAL HEAD EXCISION Right 05/15/2019   Procedure: OSTECTOMY;MET HEAD 5;  Surgeon: Samara Deist, DPM;  Location: [facility];  Service: Podiatry;  Laterality: Right;  . osteomylitis    . ROTATOR CUFF REPAIR Left     Social History Social History   Tobacco Use  . Smoking status: Former Smoker    Packs/day: 1.00    Years: 17.00    Pack years: 17.00    Types: Cigarettes    Quit date: 01/14/2019    Years since quitting: 1.8  . Smokeless tobacco: Never Used  Vaping Use  . Vaping Use: Never used  Substance Use Topics  . Alcohol use: Yes    Comment: rare  . Drug use: No    Family History Family History  Problem Relation Age of Onset  . Diabetes Mother   . Heart disease Father   . Diabetes Father   . Arthritis Other   . Hyperlipidemia Other   . Hypertension Other   . Cancer Other        breast  . Mental illness Neg Hx     No Known Allergies   REVIEW OF SYSTEMS (Negative unless checked)  Constitutional: []Weight loss  []Fever  []Chills Cardiac: []Chest pain   []Chest pressure   []Palpitations   []Shortness of breath when laying flat   []Shortness of breath with exertion. Vascular:  []Pain in legs with walking   []Pain in legs at rest  []History of DVT   []Phlebitis   []Swelling in legs   []Varicose veins   [x]Non-healing ulcers Pulmonary:   []Uses home oxygen   []Productive cough   []Hemoptysis   []Wheeze  []COPD   []Asthma Neurologic:  []Dizziness   []Seizures   []History of  stroke   []History of TIA  []Aphasia   []Vissual changes   []Weakness or numbness in arm   []Weakness or numbness in leg Musculoskeletal:   []Joint swelling   []Joint pain   []Low back pain Hematologic:  []Easy bruising  []Easy bleeding   []Hypercoagulable state   []Anemic Gastrointestinal:  []Diarrhea   []Vomiting  []Gastroesophageal reflux/heartburn   []Difficulty swallowing. Genitourinary:  []Chronic kidney disease   []Difficult urination  []Frequent urination   []Blood in urine Skin:  []Rashes   [x]Ulcers  Psychological:  []History of anxiety   [] History of major depression.  Physical Examination  Vitals:   11/11/20 0603 11/11/20 0810 11/11/20  1216 11/11/20 1548  BP:  (!) 155/83 136/82 (!) 171/86  Pulse:  73 70 79  Resp:  _0 Temp:  98 F (36.7 C) 98.5 F (36.9 C) 98.2 F (36.8 C)  TempSrc:  Oral Oral Oral  SpO2:  100% 97% 100%  Weight: 105.1 kg     Height:       Body mass index is 33.25 kg/m. Gen: WD/WN, NAD Head: [place]/AT, No temporalis wasting.  Ear/Nose/Throat: Hearing grossly intact, nares w/o erythema or drainage Eyes: PER, EOMI, sclera nonicteric.  Neck: Supple, no large masses.   Pulmonary:  Good air movement, no audible wheezing bilaterally, no use of accessory muscles.  Cardiac: RRR, no JVD Vascular: Left forefoot is dressed with a bulky gauze dressing Vessel Right Left  Radial Palpable Palpable  Popliteal Palpable Palpable  PT Palpable Palpable  DP Palpable Palpable  Gastrointestinal: Non-distended. No guarding/no peritoneal signs.  Musculoskeletal: M/S 5/5 throughout.  Toe amputation right foot.  Neurologic: CN 2-12 intact. Symmetrical.  Speech is fluent. Motor exam as listed above. Psychiatric: Judgment intact, Mood & affect appropriate for pt's clinical situation. Dermatologic: No rashes + ulcers noted.  + changes consistent with cellulitis. Lymph : No lichenification or skin changes of chronic lymphedema.  CBC Lab Results  Component Value Date    WBC 14.6 (H) 11/11/2020   HGB 8.2 (L) 11/11/2020   HCT 25.7 (L) 11/11/2020   MCV 84.3 11/11/2020   PLT 279 11/11/2020    BMET    Component Value Date/Time   NA 133 (L) 11/11/2020 0522   K 3.5 11/11/2020 0522   CL 100 11/11/2020 0522   CO2 23 11/11/2020 0522   GLUCOSE 184 (H) 11/11/2020 0522   BUN 12 11/11/2020 0522   CREATININE 1.19 11/11/2020 0522   CALCIUM 7.7 (L) 11/11/2020 0522   GFRNONAA >60 11/11/2020 0522   GFRAA >60 05/05/2020 1930   Estimated Creatinine Clearance: 96.1 mL/min (by C-G formula based on SCr of 1.19 mg/dL).  COAG Lab Results  Component Value Date   INR 1.2 11/07/2020   INR 1.07 11/26/2018    Radiology CT LUMBAR SPINE W CONTRAST  Result Date: 11/08/2020 CLINICAL DATA:  Low back pain, infection suspected. 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 Diabetes. Severe low back pain. EXAM: CT LUMBAR SPINE WITH CONTRAST TECHNIQUE: Multidetector CT imaging of the lumbar spine was performed with intravenous contrast administration. CONTRAST:  173m OMNIPAQUE IOHEXOL 300 MG/ML  SOLN COMPARISON:  MRI of lumbar spine 10/15/2018 FINDINGS: Segmentation: 5 non rib-bearing lumbar type vertebral bodies are present. The lowest fully formed vertebral body is L5. Alignment: No significant listhesis is present. Lumbar lordosis is preserved. Vertebrae: Vertebral body heights maintained. No focal lytic or blastic lesions are present. Paraspinal and other soft tissues: Paraspinous soft tissues are unremarkable. No focal inflammatory change or fluid collection is present. Visualized portions the abdomen are unremarkable. Disc levels: T12-L1: Mild facet hypertrophy is present. No significant disc disease or stenosis is present. L1-2: Facet hypertrophy is present. Minimal disc bulging is present. No significant stenosis is present. L2-3: Mild broad-based disc bulge is present. Moderate facet hypertrophy is noted bilaterally. Mild foraminal narrowing is present bilaterally. L3-4: Broad-based disc  protrusion is present. Moderate facet hypertrophy has progressed. This results in mild central and bilateral foraminal stenosis. L4-5: Broad-based disc protrusion is present. Moderate facet hypertrophy is noted bilaterally. Moderate central canal narrowing is worse left than right. Moderate bilateral foraminal narrowing is again seen. L5-S1: Right paramedian disc protrusion is present with calcification.  Severe right subarticular stenosis is again seen. Moderate to severe right and moderate left foraminal narrowing is present. SI joints are fused bilaterally. No significant intracanalicular enhancement is evident. IMPRESSION: 1. Progressive multilevel spondylosis of the lumbar spine as described. 2. 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 3. Moderate central canal narrowing at L4-5 is worse left than right. 4. Moderate bilateral foraminal narrowing at L4-5. 5. Progressive mild central and bilateral foraminal stenosis at L3-4. 6. Fusion of the SI joints bilaterally. 7. No focal soft tissue enhancement or fluid collection to suggest infection. If clinical concern persists, MRI more sensitive for evaluation of soft tissues in the spinal canal and foramina. Electronically Signed   By: San Morelle M.D.   On: 11/08/2020 15:21   MR FOOT LEFT W WO CONTRAST  Result Date: 11/10/2020 CLINICAL DATA:  Diabetic left foot wound. Left third toe amputation with partial ray resection with wound debridement on 11/07/2020. EXAM: MRI OF THE LEFT FOREFOOT WITHOUT AND WITH CONTRAST TECHNIQUE: Multiplanar, multisequence MR imaging of the left forefoot was performed both before and after administration of intravenous contrast. CONTRAST:  58m GADAVIST GADOBUTROL 1 MMOL/ML IV SOLN COMPARISON:  X-ray 11/07/2020 FINDINGS: Bones/Joint/Cartilage Interval third toe amputation with partial third ray resection at the level of the third metatarsal neck. There is marrow edema throughout the  residual third metatarsal diaphysis with preservation of the fatty T1 marrow signal, likely reactive. There is marrow edema and enhancement with low T1 marrow signal changes along the lateral margin of the second metatarsal head (series 7, images 15-16; series 3, image 23) suspicious for osteomyelitis. There is mild marrow edema along the base of the second toe proximal phalanx as well as the fourth metatarsal head and base of fourth toe proximal phalanx without definite cortical destruction or low T1 marrow signal changes. Findings may reflect reactive osteitis. Early acute osteomyelitis at these locations is not excluded. Small second and fourth MTP joint effusions. Remaining osseous structures of the forefoot are within normal limits. No fractures. No dislocation. Ligaments Intact Lisfranc ligament. Collateral ligaments of the remaining metatarsophalangeal joints appear intact. Muscles and Tendons Diffuse edema-like signal throughout the intrinsic foot musculature suggestive of a myositis. No tenosynovial fluid collections. Soft tissues Surgical defect at the site of third toe amputation with ill-defined fluid and air within the surgical bed. There is a tract of air and fluid with peripherally enhancing margins along the plantar aspect of the forefoot extending from the level of the third metatarsal head and propagating proximally and medially to the level of the midfoot, likely site of surgical incision/debridement (series 14, images 14-20). Otherwise, there are no organized or rim enhancing fluid collections. IMPRESSION: 1. Interval third toe amputation with partial third ray resection at the level of the third metatarsal neck. Marrow edema throughout the residual third metatarsal diaphysis is likely reactive. 2. Marrow edema and enhancement along the lateral margin of the second metatarsal head is suspicious for acute osteomyelitis. 3. Mild marrow edema along the base of the second toe proximal phalanx as well  as the fourth metatarsal head and base of the fourth toe proximal phalanx without definite cortical destruction or marrow replacement. Findings may reflect reactive osteitis. Early acute osteomyelitis at these locations is not excluded. 4. Small second and fourth MTP joint effusions, which may be reactive or reflect septic arthritis. 5. Extensive postsurgical changes from debridement of the plantar soft tissues of the left forefoot. No organized fluid collection or abscess. 6. Diffuse  edema-like signal throughout the intrinsic foot musculature suggestive of a myositis. Electronically Signed   By: Davina Poke D.O.   On: 11/10/2020 12:58   DG Foot 2 Views Left  Result Date: 11/07/2020 CLINICAL DATA:  44 year old male with left foot infection. EXAM: LEFT FOOT - 2 VIEW COMPARISON:  None. FINDINGS: There is no acute fracture or dislocation. There is mild osteopenia and mild hallux valgus. No periosteal elevation or bone erosion to suggest osteomyelitis. There is diffuse subcutaneous gas along the plantar foot extending into the third toe. There is diffuse soft tissue swelling and subcutaneous edema. IMPRESSION: 1. No acute fracture or dislocation. 2. Diffuse subcutaneous gas along the plantar foot extending into the third toe. Findings concerning for necrotizing fasciitis. Electronically Signed   By: Anner Crete M.D.   On: 11/07/2020 18:25     Assessment/Plan 1.  Diabetic foot abscess with extensive local tissue destruction: The patient clearly has adequate arterial perfusion to heal and an amputation at any level.  I have talked extensively with Dr. Cleda Mccreedy and there is a possibility of salvaging the heel and midfoot.  This will require a complete transmetatarsal amputation and mobilization of the remaining skin to cover.  If this is not successful then clearly the patient would heal a below-knee amputation.  I have discussed this extensively with the patient I have arranged for Mr. Timtohy Broski the  prosthetists to meet with both the patient and his wife so that he can describe some of the state-of-the-art technologies for prostheses.  At the present time our plan is to attempt salvage of the foot but if unsuccessful the patient has voiced that he is fully prepared for a below-knee amputation.  I will continue to follow along.  2.  Diabetes mellitus: Continue hypoglycemic medications as already ordered, these medications have been reviewed and there are no changes at this time.  Hgb A1C to be monitored as already arranged by primary service  3.  Hypertension: Continue antihypertensive medications as already ordered, these medications have been reviewed and there are no changes at this time.  4.  Hypercholesterolemia: Continue statin as ordered and reviewed, no changes at this time    Hortencia Pilar, MD  11/11/2020 5:24 PM

## 2020-11-11 NOTE — Progress Notes (Signed)
ID Pt seen with Dr.Cline Wife at bed side Pt in good spirits No pain No nausea  O/e awake and alert No distress at rest Patient Vitals for the past 24 hrs:  BP Temp Temp src Pulse Resp SpO2 Weight  11/11/20 0810 (!) 155/83 98 F (36.7 C) Oral 73 18 100 % --  11/11/20 0603 -- -- -- -- -- -- 105.1 kg  11/11/20 0413 (!) 152/81 98.7 F (37.1 C) Oral 73 -- 97 % --  11/10/20 2035 (!) 157/87 99.3 F (37.4 C) Oral 89 -- 100 % --  11/10/20 1640 (!) 168/89 98.3 F (36.8 C) Oral 92 17 100 % --   Left foot examined On the plantar surface in the mid foot there is some necrosisof the tissue Dorsum looks okay   3rd toe is amputated  Labs CBC Latest Ref Rng & Units 11/11/2020 11/09/2020 11/08/2020  WBC 4.0 - 10.5 K/uL 14.6(H) 11.7(H) 12.3(H)  Hemoglobin 13.0 - 17.0 g/dL 8.2(L) 7.8(L) 7.9(L)  Hematocrit 39.0 - 52.0 % 25.7(L) 24.8(L) 24.9(L)  Platelets 150 - 400 K/uL 279 244 250   CMP Latest Ref Rng & Units 11/11/2020 11/09/2020 11/08/2020  Glucose 70 - 99 mg/dL 184(H) 201(H) 167(H)  BUN 6 - 20 mg/dL _0 Creatinine 0.61 - 1.24 mg/dL 1.19 1.24 1.35(H)  Sodium 135 - 145 mmol/L 133(L) 134(L) 130(L)  Potassium 3.5 - 5.1 mmol/L 3.5 3.2(L) 2.9(L)  Chloride 98 - 111 mmol/L 100 100 96(L)  CO2 22 - 32 mmol/L _1 Calcium 8.9 - 10.3 mg/dL 7.7(L) 7.6(L) 7.9(L)  Total Protein 6.5 - 8.1 g/dL - - -  Total Bilirubin 0.3 - 1.2 mg/dL - - -  Alkaline Phos 38 - 126 U/L - - -  AST 15 - 41 U/L - - -  ALT 0 - 44 U/L - - -    Micro 11/07/20   4/4 group B streptococcus bacteremia 11/07/20 Wound culture- GBS + anerobes  Pathology SKIN AND SOFT TISSUE WITH CHRONIC ULCERATION, ABSCESS, AND UNDERLYING  BONE WITH ACUTE OSTEOMYELITIS.  - ABSCESS INVOLVES SOFT TISSUE RESECTION MARGIN.  - FOCAL ACUTE OSTEOMYELITIS AT BONE RESECTION MARGIN  Impression/recommedation  Diabetic foot infection with necrotizing infection of the foot and toes, abscess foot gangrene 3rd toe- s/p amputation Acute  osteomyelitis of the bone with resected bone margin positive for osteo Culture GBS and anerobes Initial plan was for him to have BKA . 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  After many months of repeated infections in 2019 and 2020 needing multiple courses of antibiotics including vancomycin and  then daptomycin the foot has healed completely.   Discussed the management with patient and his wife in great detail ID will follow him peripherally this weekend- call if needed [phone]

## 2020-11-11 NOTE — Transfer of Care (Signed)
Immediate Anesthesia Transfer of Care Note  Patient: Richard Mendoza  Procedure(s) Performed: IRRIGATION AND DEBRIDEMENT FOOT (Left Foot)  Patient Location: PACU  Anesthesia Type:General  Level of Consciousness: drowsy  Airway & Oxygen Therapy: Patient Spontanous Breathing and Patient connected to face mask oxygen  Post-op Assessment: Report given to RN and Post -op Vital signs reviewed and stable  Post vital signs: Reviewed and stable  Last Vitals:  Vitals Value Taken Time  BP 110/72 11/11/20 2315  Temp 37.1 C 11/11/20 2315  Pulse 67 11/11/20 2317  Resp 23 11/11/20 2317  SpO2 100 % 11/11/20 2317  Vitals shown include unvalidated device data.  Last Pain:  Vitals:   11/11/20 2055  TempSrc: Oral  PainSc:       Patients Stated Pain Goal: 4 (11/11/20 1900)  Complications: No complications documented.

## 2020-11-11 NOTE — Progress Notes (Signed)
PROGRESS NOTE    Richard Mendoza  VEL:[id number] DOB: January 27, 1976 DOA: 11/07/2020 PCP: Tracie Harrier, MD   Brief Narrative:   44 y.o. 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  His blood glucose levels have been significantly elevated.  He admitted to fever and chills at home.  He has been having nausea without vomiting or diarrhea or abdominal pain.  He denied any dyspnea or cough or wheezing.  He admits to polyuria and polydipsia.  He was given doxycycline by Dr. Vickki Muff and has been on it.  His left foot pain has been worsening with associated erythema, swelling and radiation to his left leg.  No chest pain or dyspnea or palpitations.  No dysuria, oliguria or hematuria or flank pain.  Upon presentation to the emergency room, temperature was 101.2 heart rate of 104 and otherwise normal vital signs.  Labs revealed hyponatremia 121 with hypochloremia of 82, hypokalemia 3.4 and significant hyperglycemia blood glucose of 619 and CO2 of 25 with anion gap of 14.  Alk phos was 134 with albumin of 2.7 and troponin 8.5.  Lactic acid was 1.8 CBC showed leukocytosis of 14.5 with neutrophilia and anemia.  Influenza antigens and COVID-19 PCR came back negative.  Blood cultures were drawn.  Two-view chest x-ray showed diffuse subcutaneous gas along the plantar foot extending into the third toe with findings concerning for necrotizing fasciitis and no acute fracture or dislocation.  Dr. Caryl Comes was notified about the patient and evaluated him at bedside   12/22: Postop day #2.  Patient still with some significant pain.  Also endorsing back pain in addition to throbbing pain in the affected extremity. 12/23: Improved pain control   Assessment & Plan:   Active Problems:   Necrotizing fasciitis of ankle and  foot (HCC)  Left foot necrotizing fasciitis  with associated severe purulent left third toe cellulitisabscess.  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  Pain control improving over interval Group B strep noted on wound cultures No evidence of MRSA Podiatry saw patient 11/10/2020.  Unfortunately nonviable tissue in the foot.  Recommending below the knee amputation.  Vascular surgery aware and has evaluated patient and agrees with podiatry's recommendations. 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  CT L spine ordered to r/o infection.  None noted Plan: Multimodal pain control  Nonketotic hyperosmolar hyperglycemia:  Poorly controlled diabetes mellitus  Last hemoglobin A1c 12 Weaned off of insulin drip.  Plan: Continue on lantus, aspart & SSI w/ accuchecks.  DM coordinator consulted.   Leukocytosis:  secondary to above infection. Continue on IV abxs    Chronic kidney disease stage IIIa:  Creatinine at baseline Monitor daily renal function Avoid nonessential nephrotoxins  Peripheral neuropathy  continue on home dose of gabapentin   Hyponatremia  likely pseudohyponatremia from hyperglycemia  Hypokalemia  KCl repleted. 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  No need for a transfusion at this time.  DVT prophylaxis: SQ Lovenox Code  Status: Full Family Communication: Wife at bedside Disposition Plan:Status is: Inpatient  Remains inpatient appropriate because:Inpatient level of care appropriate due to severity of illness   Dispo: The patient is from: Home               Anticipated d/c is to: Unknown at this time              Anticipated d/c date is: > 3 days              Patient currently is not medically stable to d/c.   Plan for BKA per vascular.  Disposition plan pending.             Consultants:   Podiatry  Infectious disease  Procedures:   Left foot I&D 11/07/2020  Antimicrobials:   Vancomycin  Cefepime  Metronidazole   Subjective: Seen and examined.  Upset about possibility of losing his foot.  Pain control improved.  Objective: Vitals:   11/11/20 0413 11/11/20 0603 11/11/20 0810 11/11/20 1216  BP: (!) 152/81  (!) 155/83 136/82  Pulse: 73  73 70  Resp:   18 19  Temp: 98.7 F (37.1 C)  98 F (36.7 C) 98.5 F (36.9 C)  TempSrc: Oral  Oral Oral  SpO2: 97%  100% 97%  Weight:  105.1 kg    Height:        Intake/Output Summary (Last 24 hours) at 11/11/2020 1330 Last data filed at 11/11/2020 0846 Gross per 24 hour  Intake 240 ml  Output 1500 ml  Net -1260 ml   Filed Weights   11/07/20 1752 11/10/20 0302 11/11/20 0603  Weight: 102.1 kg 102.8 kg 105.1 kg    Examination:  General exam: Appears calm and comfortable  Respiratory system: Clear to auscultation. Respiratory effort normal. Cardiovascular system: S1 & S2 heard, RRR. No JVD, murmurs, rubs, gallops or clicks. No pedal edema. Gastrointestinal system: Abdomen is nondistended, soft and nontender. No organomegaly or masses felt. Normal bowel sounds heard. Central nervous system: Alert and oriented. No focal neurological deficits. Extremities: Symmetric 5 x 5 power.  Left foot in surgical wraps, not removed.  Dressings appear C/D/I Skin: No rashes, lesions or ulcers Psychiatry: Judgement and insight appear normal. Mood & affect appropriate.     Data Reviewed: I have personally reviewed following labs and imaging studies  CBC: Recent Labs  Lab 11/07/20 1758 11/08/20 0343 11/08/20 1206 11/09/20 0316 11/11/20 0522  WBC 14.5* 11.8* 12.3* 11.7*  14.6*  NEUTROABS 12.9*  --   --   --  11.3*  HGB 10.1* 8.0* 7.9* 7.8* 8.2*  HCT 31.6* 25.0* 24.9* 24.8* 25.7*  MCV 83.6 83.6 84.1 85.2 84.3  PLT 346 266 250 244 888   Basic Metabolic Panel: Recent Labs  Lab 11/08/20 0343 11/08/20 0726 11/08/20 1206 11/09/20 0316 11/11/20 0522  NA 131* 129* 130* 134* 133*  K 2.9* 2.7* 2.9* 3.2* 3.5  CL 96* 95* 96* 100 100  CO2 _0 GLUCOSE 183* 168* 167* 201* 184*  BUN _1 CREATININE 1.23 1.34* 1.35* 1.24 1.19  CALCIUM 7.9* 7.8* 7.9* 7.6* 7.7*   GFR: Estimated Creatinine Clearance: 96.1 mL/min (by C-G formula based on SCr of 1.19 mg/dL). Liver Function Tests: Recent Labs  Lab 11/07/20 1758  AST 25  ALT 17  ALKPHOS 134*  BILITOT 1.1  PROT 8.5*  ALBUMIN 2.7*   No results for input(s): LIPASE, AMYLASE in the last 168 hours. No results for  input(s): AMMONIA in the last 168 hours. Coagulation Profile: Recent Labs  Lab 11/07/20 1758  INR 1.2   Cardiac Enzymes: No results for input(s): CKTOTAL, CKMB, CKMBINDEX, TROPONINI in the last 168 hours. BNP (last 3 results) No results for input(s): PROBNP in the last 8760 hours. HbA1C: No results for input(s): HGBA1C in the last 72 hours. CBG: Recent Labs  Lab 11/10/20 1641 11/10/20 1729 11/10/20 2038 11/11/20 0810 11/11/20 1218  GLUCAP 68* 109* 204* 182* 181*   Lipid Profile: No results for input(s): CHOL, HDL, LDLCALC, TRIG, CHOLHDL, LDLDIRECT in the last 72 hours. Thyroid Function Tests: No results for input(s): TSH, T4TOTAL, FREET4, T3FREE, THYROIDAB in the last 72 hours. Anemia Panel: No results for input(s): VITAMINB12, FOLATE, FERRITIN, TIBC, IRON, RETICCTPCT in the last 72 hours. Sepsis Labs: Recent Labs  Lab 11/07/20 1758 11/07/20 2343  LATICACIDVEN 1.8 2.8*    Recent Results (from the past 240 hour(s))  Urine culture     Status: None   Collection Time: 11/07/20  5:49 PM   Specimen: In/Out Cath Urine  Result Value Ref Range Status    Specimen Description   Final    IN/OUT CATH URINE Performed at [facility], [address], [place], [place] [address]    Special Requests   Final    NONE Performed at [facility], [address], [place], [place] [address]    Culture   Final    NO GROWTH Performed at [facility], [place] [address] [place], [place] [address]    Report Status 11/08/2020 FINAL  Final  Blood Culture (routine x 2)     Status: Abnormal   Collection Time: 11/07/20  5:58 PM   Specimen: BLOOD  Result Value Ref Range Status   Specimen Description   Final    BLOOD RIGHT ANTECUBITAL Performed at [facility], [address] [place], [place] [address]    Special Requests   Final    BOTTLES DRAWN AEROBIC AND ANAEROBIC Blood Culture results may not be optimal due to an excessive volume of blood received in culture bottles Performed at [facility], [address] [place], [place] [address]    Culture  Setup Time   Final    GRAM POSITIVE COCCI IN BOTH AEROBIC AND ANAEROBIC BOTTLES CRITICAL VALUE NOTED.  VALUE IS CONSISTENT WITH PREVIOUSLY REPORTED AND CALLED VALUE.    Culture (A)  Final    GROUP B STREP(S.AGALACTIAE)ISOLATED SUSCEPTIBILITIES PERFORMED ON PREVIOUS CULTURE WITHIN THE LAST 5 DAYS. Performed at [facility], [place] [address] [place], [place] [address]    Report Status 11/11/2020 FINAL  Final  Blood Culture (routine x 2)     Status: Abnormal   Collection Time: 11/07/20  5:58 PM   Specimen: BLOOD  Result Value Ref Range Status   Specimen Description   Final    BLOOD BLOOD LEFT FOREARM Performed at [facility], [address], [place], [place] [address]    Special Requests   Final    BOTTLES DRAWN AEROBIC AND ANAEROBIC Blood Culture adequate volume Performed at [facility], [address] [place], [place] [address]    Culture  Setup Time   Final    Organism ID to follow GRAM POSITIVE COCCI IN BOTH AEROBIC AND  ANAEROBIC BOTTLES CRITICAL RESULT CALLED TO, READ BACK BY AND VERIFIED WITH: Laqueta Carina AT 3354 12/21/821 SDR Performed at [facility], [address], [place],  [address]    Culture  GROUP B STREP(S.AGALACTIAE)ISOLATED (A)  Final   Report Status 11/10/2020 FINAL  Final   Organism ID, Bacteria GROUP B STREP(S.AGALACTIAE)ISOLATED  Final      Susceptibility   Group b strep(s.agalactiae)isolated - MIC*    CLINDAMYCIN >=1 RESISTANT Resistant     AMPICILLIN <=0.25 SENSITIVE Sensitive     ERYTHROMYCIN 2 RESISTANT Resistant     VANCOMYCIN 0.5 SENSITIVE Sensitive     CEFTRIAXONE <=0.12 SENSITIVE Sensitive     LEVOFLOXACIN 1 SENSITIVE Sensitive     PENICILLIN Value in next row Sensitive      SENSITIVE<=0.06    * GROUP B STREP(S.AGALACTIAE)ISOLATED  Resp Panel by RT-PCR (Flu A&B, Covid) Nasopharyngeal Swab     Status: None   Collection Time: 11/07/20  5:58 PM   Specimen: Nasopharyngeal Swab; Nasopharyngeal(NP) swabs in vial transport medium  Result Value Ref Range Status   SARS Coronavirus 2 by RT PCR NEGATIVE NEGATIVE Final    Comment: (NOTE) SARS-CoV-2 target nucleic acids are NOT DETECTED.  The SARS-CoV-2 RNA is generally detectable in upper respiratory specimens during the acute phase of infection. The lowest concentration of SARS-CoV-2 viral copies this assay can detect is 138 copies/mL. A negative result does not preclude SARS-Cov-2 infection and should not be used as the sole basis for treatment or other patient management decisions. A negative result may occur with  improper specimen collection/handling, submission of specimen other than nasopharyngeal swab, presence of viral mutation(s) within the areas targeted by this assay, and inadequate number of viral copies(<138 copies/mL). A negative result must be combined with clinical observations, patient history, and epidemiological information. The expected result is Negative.  Fact Sheet for Patients:   EntrepreneurPulse.com.au  Fact Sheet for Healthcare Providers:  IncredibleEmployment.be  This test is no t yet approved or cleared by the [place] FDA and  has been authorized for detection and/or diagnosis of SARS-CoV-2 by FDA under an Emergency Use Authorization (EUA). This EUA will remain  in effect (meaning this test can be used) for the duration of the COVID-19 declaration under Section 564(b)(1) of the Act, 21 U.S.C.section 360bbb-3(b)(1), unless the authorization is terminated  or revoked sooner.       Influenza A by PCR NEGATIVE NEGATIVE Final   Influenza B by PCR NEGATIVE NEGATIVE Final    Comment: (NOTE) The Xpert Xpress SARS-CoV-2/FLU/RSV plus assay is intended as an aid in the diagnosis of influenza from Nasopharyngeal swab specimens and should not be used as a sole basis for treatment. Nasal washings and aspirates are unacceptable for Xpert Xpress SARS-CoV-2/FLU/RSV testing.  Fact Sheet for Patients: EntrepreneurPulse.com.au  Fact Sheet for Healthcare Providers: IncredibleEmployment.be  This test is not yet approved or cleared by the [place] FDA and has been authorized for detection and/or diagnosis of SARS-CoV-2 by FDA under an Emergency Use Authorization (EUA). This EUA will remain in effect (meaning this test can be used) for the duration of the COVID-19 declaration under Section 564(b)(1) of the Act, 21 U.S.C. section 360bbb-3(b)(1), unless the authorization is terminated or revoked.  Performed at [facility], [address] [place], [place] [address]   Blood Culture ID Panel (Reflexed)     Status: Abnormal   Collection Time: 11/07/20  5:58 PM  Result Value Ref Range Status   Enterococcus faecalis NOT DETECTED NOT DETECTED Final   Enterococcus Faecium NOT DETECTED NOT DETECTED Final   Listeria monocytogenes NOT DETECTED NOT DETECTED Final   Staphylococcus  species NOT DETECTED NOT DETECTED Final   Staphylococcus aureus (BCID)  NOT DETECTED NOT DETECTED Final   Staphylococcus epidermidis NOT DETECTED NOT DETECTED Final   Staphylococcus lugdunensis NOT DETECTED NOT DETECTED Final   Streptococcus species DETECTED (A) NOT DETECTED Final    Comment: CRITICAL RESULT CALLED TO, READ BACK BY AND VERIFIED WITH:  SUSAN WATSON AT 1307 11/08/20 SDR    Streptococcus agalactiae DETECTED (A) NOT DETECTED Final    Comment: CRITICAL RESULT CALLED TO, READ BACK BY AND VERIFIED WITH:  SUSAN WATSON AT 1307 11/08/20 SDR    Streptococcus pneumoniae NOT DETECTED NOT DETECTED Final   Streptococcus pyogenes NOT DETECTED NOT DETECTED Final   A.calcoaceticus-baumannii NOT DETECTED NOT DETECTED Final   Bacteroides fragilis NOT DETECTED NOT DETECTED Final   Enterobacterales NOT DETECTED NOT DETECTED Final   Enterobacter cloacae complex NOT DETECTED NOT DETECTED Final   Escherichia coli NOT DETECTED NOT DETECTED Final   Klebsiella aerogenes NOT DETECTED NOT DETECTED Final   Klebsiella oxytoca NOT DETECTED NOT DETECTED Final   Klebsiella pneumoniae NOT DETECTED NOT DETECTED Final   Proteus species NOT DETECTED NOT DETECTED Final   Salmonella species NOT DETECTED NOT DETECTED Final   Serratia marcescens NOT DETECTED NOT DETECTED Final   Haemophilus influenzae NOT DETECTED NOT DETECTED Final   Neisseria meningitidis NOT DETECTED NOT DETECTED Final   Pseudomonas aeruginosa NOT DETECTED NOT DETECTED Final   Stenotrophomonas maltophilia NOT DETECTED NOT DETECTED Final   Candida albicans NOT DETECTED NOT DETECTED Final   Candida auris NOT DETECTED NOT DETECTED Final   Candida glabrata NOT DETECTED NOT DETECTED Final   Candida krusei NOT DETECTED NOT DETECTED Final   Candida parapsilosis NOT DETECTED NOT DETECTED Final   Candida tropicalis NOT DETECTED NOT DETECTED Final   Cryptococcus neoformans/gattii NOT DETECTED NOT DETECTED Final    Comment: Performed at [facility], [address] [place], [place] [address]  MRSA PCR Screening     Status: None   Collection Time: 11/07/20  8:03 PM   Specimen: Nasopharyngeal  Result Value Ref Range Status   MRSA by PCR NEGATIVE NEGATIVE Final    Comment:        The GeneXpert MRSA Assay (FDA approved for NASAL specimens only), is one component of a comprehensive MRSA colonization surveillance program. It is not intended to diagnose MRSA infection nor to guide or monitor treatment for MRSA infections. Performed at [facility], [address] [place], [place] [address]   Anaerobic culture     Status: None (Preliminary result)   Collection Time: 11/07/20  8:13 PM   Specimen: PATH Other  Result Value Ref Range Status   Specimen Description   Final    TOE LEFT Performed at [facility], [address] [place], [place] [address]    Special Requests BONE  Final   Gram Stain   Final    RARE WBC PRESENT,BOTH PMN AND MONONUCLEAR MODERATE GRAM POSITIVE COCCI IN PAIRS IN CLUSTERS    Culture   Final    HOLDING FOR POSSIBLE ANAEROBE Performed at [facility], [place] [address], [place], [place] [address]    Report Status PENDING  Incomplete  Aerobic/Anaerobic Culture (surgical/deep wound)     Status: None (Preliminary result)   Collection Time: 11/07/20  8:13 PM   Specimen: PATH Other; Abscess  Result Value Ref Range Status   Specimen Description   Final    ABSCESS Performed at [facility], [address], [place], [place] [address]    Special Requests   Final  LEFT FOOT Performed at [facility], [place], [place] [address]    Gram Stain   Final    RARE WBC PRESENT,BOTH PMN AND MONONUCLEAR ABUNDANT GRAM POSITIVE COCCI IN PAIRS IN CLUSTERS    Culture   Final    FEW GROUP B STREP(S.AGALACTIAE)ISOLATED TESTING AGAINST S. AGALACTIAE NOT ROUTINELY PERFORMED DUE TO PREDICTABILITY OF AMP/PEN/VAN SUSCEPTIBILITY. HOLDING FOR POSSIBLE  ANAEROBE Performed at [facility], [place] [address] [place], [place] [address]    Report Status PENDING  Incomplete  Aerobic Culture (superficial specimen)     Status: None   Collection Time: 11/07/20  8:13 PM   Specimen: PATH Other  Result Value Ref Range Status   Specimen Description   Final    ABSCESS Performed at [facility], [address] [place], [place] [address]    Special Requests LEFT FOOT BONE  Final   Gram Stain   Final    NO WBC SEEN FEW GRAM POSITIVE COCCI IN PAIRS IN CLUSTERS    Culture   Final    MODERATE GROUP B STREP(S.AGALACTIAE)ISOLATED TESTING AGAINST S. AGALACTIAE NOT ROUTINELY PERFORMED DUE TO PREDICTABILITY OF AMP/PEN/VAN SUSCEPTIBILITY. Performed at [facility], [place] [address], [place], [place] [address]    Report Status 11/10/2020 FINAL  Final         Radiology Studies: MR FOOT LEFT W WO CONTRAST  Result Date: 11/10/2020 CLINICAL DATA:  Diabetic left foot wound. Left third toe amputation with partial ray resection with wound debridement on 11/07/2020. EXAM: MRI OF THE LEFT FOREFOOT WITHOUT AND WITH CONTRAST TECHNIQUE: Multiplanar, multisequence MR imaging of the left forefoot was performed both before and after administration of intravenous contrast. CONTRAST:  5m GADAVIST GADOBUTROL 1 MMOL/ML IV SOLN COMPARISON:  X-ray 11/07/2020 FINDINGS: Bones/Joint/Cartilage Interval third toe amputation with partial third ray resection at the level of the third metatarsal neck. There is marrow edema throughout the residual third metatarsal diaphysis with preservation of the fatty T1 marrow signal, likely reactive. There is marrow edema and enhancement with low T1 marrow signal changes along the lateral margin of the second metatarsal head (series 7, images 15-16; series 3, image 23) suspicious for osteomyelitis. There is mild marrow edema along the base of the second toe proximal phalanx as well as the fourth metatarsal head and base of  fourth toe proximal phalanx without definite cortical destruction or low T1 marrow signal changes. Findings may reflect reactive osteitis. Early acute osteomyelitis at these locations is not excluded. Small second and fourth MTP joint effusions. Remaining osseous structures of the forefoot are within normal limits. No fractures. No dislocation. Ligaments Intact Lisfranc ligament. Collateral ligaments of the remaining metatarsophalangeal joints appear intact. Muscles and Tendons Diffuse edema-like signal throughout the intrinsic foot musculature suggestive of a myositis. No tenosynovial fluid collections. Soft tissues Surgical defect at the site of third toe amputation with ill-defined fluid and air within the surgical bed. There is a tract of air and fluid with peripherally enhancing margins along the plantar aspect of the forefoot extending from the level of the third metatarsal head and propagating proximally and medially to the level of the midfoot, likely site of surgical incision/debridement (series 14, images 14-20). Otherwise, there are no organized or rim enhancing fluid collections. IMPRESSION: 1. Interval third toe amputation with partial third ray resection at the level of the third metatarsal neck. Marrow edema throughout the residual third metatarsal diaphysis is likely reactive. 2. Marrow edema and enhancement along the lateral  margin of the second metatarsal head is suspicious for acute osteomyelitis. 3. Mild marrow edema along the base of the second toe proximal phalanx as well as the fourth metatarsal head and base of the fourth toe proximal phalanx without definite cortical destruction or marrow replacement. Findings may reflect reactive osteitis. Early acute osteomyelitis at these locations is not excluded. 4. Small second and fourth MTP joint effusions, which may be reactive or reflect septic arthritis. 5. Extensive postsurgical changes from debridement of the plantar soft tissues of the left  forefoot. No organized fluid collection or abscess. 6. Diffuse edema-like signal throughout the intrinsic foot musculature suggestive of a myositis. Electronically Signed   By: Davina Poke D.O.   On: 11/10/2020 12:58        Scheduled Meds: . acetaminophen  650 mg Oral Q6H  . atorvastatin  40 mg Oral Daily  . busPIRone  15 mg Oral BID  . Chlorhexidine Gluconate Cloth  6 each Topical Daily  . dicyclomine  10 mg Oral TID AC & HS  . enoxaparin (LOVENOX) injection  50 mg Subcutaneous Daily  . famotidine  20 mg Oral BID  . ferrous sulfate  325 mg Oral BID WC  . gabapentin  600 mg Oral QHS  . insulin aspart  0-20 Units Subcutaneous TID AC & HS  . insulin aspart  5 Units Subcutaneous TID WC  . insulin glargine  75 Units Subcutaneous Daily  . lidocaine  1 patch Transdermal Q24H  . methocarbamol  750 mg Oral QID  . metoCLOPramide  10 mg Oral TID AC  . multivitamin with minerals  1 tablet Oral Daily  . pantoprazole  40 mg Oral Daily  . sertraline  100 mg Oral Daily  . vitamin B-12  500 mcg Oral Daily   Continuous Infusions: . cefTRIAXone (ROCEPHIN)  IV 2 g (11/11/20 0841)  . lactated ringers    . lactated ringers    . metronidazole 500 mg (11/11/20 0947)     LOS: 4 days    Time spent: 15 minutes    Sidney Ace, MD [facility] Pager [phone]  If 7PM-7AM, please contact night-coverage 11/11/2020, 1:30 PM

## 2020-11-11 NOTE — Progress Notes (Signed)
Hypoglycemic Event  CBG: 67  Treatment: 25mg  of dextrose Symptoms: nothing  Follow-up CBG: Time: CBG Result:  Possible Reasons for Event: NPO for surgery  Comments/MD notified:Dr.Klein   

## 2020-11-11 NOTE — H&P (View-Only) (Signed)
4 Days Post-Op   Subjective/Chief Complaint: Patient seen with Dr. Rivka Safer   Objective: Vital signs in last 24 hours: Temp:  [98 F (36.7 C)-99.3 F (37.4 C)] 98.5 F (36.9 C) (12/24 1216) Pulse Rate:  [70-92] 70 (12/24 1216) Resp:  [17-19] 19 (12/24 1216) BP: (136-168)/(81-89) 136/82 (12/24 1216) SpO2:  [97 %-100 %] 97 % (12/24 1216) Weight:  [105.1 kg] 105.1 kg (12/24 0603) Last BM Date: 11/04/20  Intake/Output from previous day: 12/23 0701 - 12/24 0700 In: 240 [P.O.:240] Out: 1850 [Urine:1850] Intake/Output this shift: Total I/O In: -  Out: 800 [Urine:800]  Still some moderate to heavy drainage on the bandaging but decreasing some. Dorsal and distal incision still appears well coapted. Areas of necrosis tissue are noted plantarly in the arch but appear to be demarcating some. Still some purulence and necrosis tissue noted through the plantar open areas. Erythema seems to be stabilizing some in the arch.    Lab Results:  Recent Labs    11/09/20 0316 11/11/20 0522  WBC 11.7* 14.6*  HGB 7.8* 8.2*  HCT 24.8* 25.7*  PLT 244 279   BMET Recent Labs    11/09/20 0316 11/11/20 0522  NA 134* 133*  K 3.2* 3.5  CL 100 100  CO2 26 23  GLUCOSE 201* 184*  BUN 12 12  CREATININE 1.24 1.19  CALCIUM 7.6* 7.7*   PT/INR No results for input(s): LABPROT, INR in the last 72 hours. ABG No results for input(s): PHART, HCO3 in the last 72 hours.  Invalid input(s): PCO2, PO2  Studies/Results: MR FOOT LEFT W WO CONTRAST  Result Date: 11/10/2020 CLINICAL DATA:  Diabetic left foot wound. Left third toe amputation with partial ray resection with wound debridement on 11/07/2020. EXAM: MRI OF THE LEFT FOREFOOT WITHOUT AND WITH CONTRAST TECHNIQUE: Multiplanar, multisequence MR imaging of the left forefoot was performed both before and after administration of intravenous contrast. CONTRAST:  70mL GADAVIST GADOBUTROL 1 MMOL/ML IV SOLN COMPARISON:  X-ray 11/07/2020 FINDINGS:  Bones/Joint/Cartilage Interval third toe amputation with partial third ray resection at the level of the third metatarsal neck. There is marrow edema throughout the residual third metatarsal diaphysis with preservation of the fatty T1 marrow signal, likely reactive. There is marrow edema and enhancement with low T1 marrow signal changes along the lateral margin of the second metatarsal head (series 7, images 15-16; series 3, image 23) suspicious for osteomyelitis. There is mild marrow edema along the base of the second toe proximal phalanx as well as the fourth metatarsal head and base of fourth toe proximal phalanx without definite cortical destruction or low T1 marrow signal changes. Findings may reflect reactive osteitis. Early acute osteomyelitis at these locations is not excluded. Small second and fourth MTP joint effusions. Remaining osseous structures of the forefoot are within normal limits. No fractures. No dislocation. Ligaments Intact Lisfranc ligament. Collateral ligaments of the remaining metatarsophalangeal joints appear intact. Muscles and Tendons Diffuse edema-like signal throughout the intrinsic foot musculature suggestive of a myositis. No tenosynovial fluid collections. Soft tissues Surgical defect at the site of third toe amputation with ill-defined fluid and air within the surgical bed. There is a tract of air and fluid with peripherally enhancing margins along the plantar aspect of the forefoot extending from the level of the third metatarsal head and propagating proximally and medially to the level of the midfoot, likely site of surgical incision/debridement (series 14, images 14-20). Otherwise, there are no organized or rim enhancing fluid collections. IMPRESSION: 1. Interval third toe  amputation with partial third ray resection at the level of the third metatarsal neck. Marrow edema throughout the residual third metatarsal diaphysis is likely reactive. 2. Marrow edema and enhancement along  the lateral margin of the second metatarsal head is suspicious for acute osteomyelitis. 3. Mild marrow edema along the base of the second toe proximal phalanx as well as the fourth metatarsal head and base of the fourth toe proximal phalanx without definite cortical destruction or marrow replacement. Findings may reflect reactive osteitis. Early acute osteomyelitis at these locations is not excluded. 4. Small second and fourth MTP joint effusions, which may be reactive or reflect septic arthritis. 5. Extensive postsurgical changes from debridement of the plantar soft tissues of the left forefoot. No organized fluid collection or abscess. 6. Diffuse edema-like signal throughout the intrinsic foot musculature suggestive of a myositis. Electronically Signed   By: Duanne Guess D.O.   On: 11/10/2020 12:58    Anti-infectives: Anti-infectives (From admission, onward)   Start     Dose/Rate Route Frequency Ordered Stop   11/11/20 0800  cefTRIAXone (ROCEPHIN) 2 g in sodium chloride 0.9 % 100 mL IVPB        2 g 200 mL/hr over 30 Minutes Intravenous Every 24 hours 11/11/20 0122     11/09/20 0600  ceFEPIme (MAXIPIME) 2 g in sodium chloride 0.9 % 100 mL IVPB  Status:  Discontinued        2 g 200 mL/hr over 30 Minutes Intravenous Every 8 hours 11/09/20 0009 11/11/20 0122   11/09/20 0000  metroNIDAZOLE (FLAGYL) IVPB 500 mg        500 mg 100 mL/hr over 60 Minutes Intravenous Every 8 hours 11/08/20 1954     11/08/20 2200  ceFEPIme (MAXIPIME) 2 g in sodium chloride 0.9 % 100 mL IVPB  Status:  Discontinued        2 g 200 mL/hr over 30 Minutes Intravenous Every 12 hours 11/08/20 1952 11/09/20 0009   11/08/20 1800  vancomycin (VANCOREADY) IVPB 1250 mg/250 mL  Status:  Discontinued        1,250 mg 166.7 mL/hr over 90 Minutes Intravenous Every 24 hours 11/07/20 1939 11/08/20 1258   11/08/20 1800  Ampicillin-Sulbactam (UNASYN) 3 g in sodium chloride 0.9 % 100 mL IVPB  Status:  Discontinued        3 g 200 mL/hr  over 30 Minutes Intravenous Every 6 hours 11/08/20 1636 11/08/20 1951   11/08/20 1400  vancomycin (VANCOREADY) IVPB 750 mg/150 mL  Status:  Discontinued        750 mg 150 mL/hr over 60 Minutes Intravenous Every 12 hours 11/08/20 1258 11/10/20 1030   11/08/20 0800  piperacillin-tazobactam (ZOSYN) IVPB 3.375 g  Status:  Discontinued        3.375 g 12.5 mL/hr over 240 Minutes Intravenous Every 8 hours 11/07/20 1939 11/08/20 1636   11/07/20 2200  clindamycin (CLEOCIN) IVPB 600 mg  Status:  Discontinued        600 mg 100 mL/hr over 30 Minutes Intravenous Every 8 hours 11/07/20 1924 11/08/20 0834   11/07/20 1930  vancomycin (VANCOCIN) IVPB 1000 mg/200 mL premix  Status:  Discontinued        1,000 mg 200 mL/hr over 60 Minutes Intravenous  Once 11/07/20 1924 11/07/20 2244   11/07/20 1930  piperacillin-tazobactam (ZOSYN) IVPB 3.375 g  Status:  Discontinued        3.375 g 100 mL/hr over 30 Minutes Intravenous  Once 11/07/20 1924 11/08/20 1636   11/07/20  1815  vancomycin (VANCOREADY) IVPB 2000 mg/400 mL  Status:  Discontinued        2,000 mg 200 mL/hr over 120 Minutes Intravenous STAT 11/07/20 1801 11/07/20 1802   11/07/20 1815  vancomycin (VANCOCIN) IVPB 1000 mg/200 mL premix  Status:  Discontinued        1,000 mg 200 mL/hr over 60 Minutes Intravenous STAT 11/07/20 1802 11/08/20 1258   11/07/20 1800  ceFEPIme (MAXIPIME) 2 g in sodium chloride 0.9 % 100 mL IVPB        2 g 200 mL/hr over 30 Minutes Intravenous STAT 11/07/20 1757 11/07/20 2303      Assessment/Plan: s/p Procedure(s): INCISION AND DRAINAGE (Left) 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 (Left) Assessment: Status post I&D with osteomyelitis.   Plan: Discussed with the patient and his wife that he does still have continued infection in the third metatarsal. Discussed that the MRI did show that there could be some early osteomyelitis in the second and fourth metatarsals but also discussed that this could be reactive.  Discussed that we could consider redebridement of the left foot with removal of an additional portion of the third metatarsal to see if this will get the infection under control. Dr. Rivka Safer in agreement. This would be his last attempt to try to salvage the foot and prevent below-knee amputation. Discussed the risks and complications including continued infection and if this does not get the infection under control he will need a below-knee amputation. They agree to proceed with the above plan. Consent for repeat I&D left foot. N.p.o. Plan for surgery later this afternoon.  LOS: 4 days    Richard Mendoza 11/11/2020

## 2020-11-11 NOTE — Anesthesia Preprocedure Evaluation (Signed)
Anesthesia Evaluation  Patient identified by MRN, date of birth, ID band Patient awake    Reviewed: Allergy & Precautions, H&P , NPO status , Patient's Chart, lab work & pertinent test results  History of Anesthesia Complications Negative for: history of anesthetic complications  Airway Mallampati: III  TM Distance: >3 FB Neck ROM: full    Dental  (+) Edentulous Upper, Edentulous Lower   Pulmonary neg shortness of breath, sleep apnea , former smoker,    Pulmonary exam normal breath sounds clear to auscultation       Cardiovascular Exercise Tolerance: Good hypertension, (-) angina(-) Past MI and (-) DOE Normal cardiovascular exam Rhythm:Regular Rate:Normal     Neuro/Psych  Neuromuscular disease negative psych ROS   GI/Hepatic Neg liver ROS, GERD  Medicated and Controlled,  Endo/Other  diabetes, Poorly Controlled, Type 2  Renal/GU      Musculoskeletal   Abdominal   Peds  Hematology  (+) anemia ,   Anesthesia Other Findings Gas gangrene   Past Medical History: 03/17/2009: DIABETES MELLITUS, TYPE II, UNCONTROLLED 12/08/2008: DM 03/17/2009: HYPERLIPIDEMIA 12/08/2008: HYPERTENSION 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  Past Surgical History: 09/09/2017: ANTERIOR CERVICAL DECOMP/DISCECTOMY FUSION; N/A     Comment:  Procedure: ANTERIOR CERVICAL DECOMPRESSION/DISCECTOMY               FUSION CERVICAL 6- CERVICAL 7;  Surgeon: Ashok Pall,               MD;  Location: [facility];  Service: Neurosurgery;                Laterality: N/A;  ANTERIOR CERVICAL               DECOMPRESSION/DISCECTOMY FUSION CERVICAL 6- CERVICAL 7 No date: APPENDECTOMY 10/03/2017: I & D EXTREMITY; Right     Comment:  Procedure: IRRIGATION AND DEBRIDEMENT RIGHT WRIST;                Surgeon: Leanora Cover, MD;  Location: [facility];  Service:               Orthopedics;  Laterality: Right; 11/26/2018: I & D EXTREMITY; Right     Comment:  Procedure: IRRIGATION AND  DEBRIDEMENT FASCIA ON RIGHT               FOOT;  Surgeon: Samara Deist, DPM;  Location: [facility];              Service: Podiatry;  Laterality: Right; 03/06/2019: INCISION AND DRAINAGE; Right     Comment:  Procedure: INCISION AND DRAINAGE RIGHT FOOT, WITH 4th               RAY AMPUTATION;  Surgeon: Samara Deist, DPM;  Location:              [facility];  Service: Podiatry;  Laterality: Right; 05/15/2019: METATARSAL HEAD EXCISION; Right     Comment:  Procedure: OSTECTOMY;MET HEAD 5;  Surgeon: Samara Deist, DPM;  Location: [facility];  Service: Podiatry;                Laterality: Right; No date: osteomylitis No date: ROTATOR CUFF REPAIR; Left  BMI    Body Mass Index: 32.28 kg/m      Reproductive/Obstetrics negative OB ROS                             Anesthesia Physical  Anesthesia Plan  ASA: III  Anesthesia Plan: General   Post-op Pain Management:    Induction: Intravenous  PONV Risk Score and Plan: 2 and Ondansetron, Dexamethasone, Midazolam and Treatment may vary due to age or medical condition  Airway Management Planned: LMA  Additional Equipment: None  Intra-op Plan:   Post-operative Plan: Extubation in OR  Informed Consent: I have reviewed the patients History and Physical, chart, labs and discussed the procedure including the risks, benefits and alternatives for the proposed anesthesia with the patient or authorized representative who has indicated his/her understanding and acceptance.     Dental Advisory Given  Plan Discussed with: Anesthesiologist, CRNA and Surgeon  Anesthesia Plan Comments: (Discussed risks of anesthesia with patient, including PONV, sore throat, lip/dental damage. Rare risks discussed as well, such as cardiorespiratory and neurological sequelae. Patient understands.)        Anesthesia Quick Evaluation

## 2020-11-11 NOTE — Op Note (Signed)
Date of operation 11/11/2020.  Surgeon: Ricci Barker D.P.M.  Preoperative diagnosis: Osteomyelitis left third metatarsal with continued abscess formation and infection left foot.  Postoperative diagnosis: Same.  Procedure: Debridement of soft tissue and bone left foot.  Anesthesia: LMA.  Hemostasis: None.  Estimated blood loss: 75 cc.  Implants: Stimulan rapid cure antibiotic beads impregnated with vancomycin x2.  Pathology: Left third metatarsal.  Injectables: 18 cc 0.5% Marcaine plain.  Complications: None apparent.  Operative indications: This is a 44 year old male with severe abscess and osteomyelitis in his left foot.  Continued infection status post previous procedure a few days ago and decision made for reI&D of the foot as an attempt for limb salvage.  Operative procedure: Patient was taken the operating room and placed on the table in the supine position.  Following satisfactory LMA anesthesia the left foot was prepped and draped in the usual sterile fashion.  Attention was directed to the previous incision along the dorsum of the foot as well as through the third interspace extending plantarly.  All sutures were removed and the incision was opened.  There was noted to be significant amounts of purulence and devitalized infected tissue in the interspace as well as throughout the plantar aspect of the foot.  Necrosing skin flap plantarly was then sharply excised using a 15 blade until there was noted to be bleeding at the edges of the wound leaving a large plantar open wound.  Plantar fascial structure superficially was excised using a 15 blade down to the level of the deep musculature.     Attention was directed to the dorsal aspect of the foot where the incision was lengthened proximally over the third metatarsal shaft approximately 3 cm.  Soft tissue dissected away from the third metatarsal shaft and the bone was transected at the base of the third metatarsal and removed.   Proximal and inked and sent for pathology.  At this point all of the remaining soft tissues were thoroughly debrided with a versa jet debrider on a setting of 4.  This was followed by pulsed irrigation with 3 L of saline.  The dorsal aspect of the incision as well as through the interspace was closed using 3-0 nylon simple interrupted sutures as well as the proximalmost portion of the incision around the medial heel.  Stimulan rapid cure antibiotic beads then placed deep within the pockets of the wound and the interspace area and the central open area where skin was lost was then covered using a Mepitel and staples.  Xeroform applied to the dorsal incision followed by a bulky gauze bandage with ABDs and Kerlix.  Ace wrap applied for compression.  Patient was awakened and transported to the PACU with vital signs stable and in good condition.

## 2020-11-11 NOTE — Interval H&P Note (Signed)
History and Physical Interval Note:  11/11/2020 7:01 PM  Richard Mendoza  has presented today for surgery, with the diagnosis of Infection Left foot.  The various methods of treatment have been discussed with the patient and family. After consideration of risks, benefits and other options for treatment, the patient has consented to  Procedure(s): IRRIGATION AND DEBRIDEMENT FOOT (Left) as a surgical intervention.  The patient's history has been reviewed, patient examined, no change in status, stable for surgery.  I have reviewed the patient's chart and labs.  Questions were answered to the patient's satisfaction.     Ricci Barker

## 2020-11-11 NOTE — Anesthesia Procedure Notes (Signed)
Procedure Name: LMA Insertion Date/Time: 11/11/2020 9:53 PM Performed by: Hezzie Bump, CRNA Pre-anesthesia Checklist: Patient identified, Patient being monitored, Timeout performed, Emergency Drugs available and Suction available Patient Re-evaluated:Patient Re-evaluated prior to induction Oxygen Delivery Method: Circle system utilized Preoxygenation: Pre-oxygenation with 100% oxygen Induction Type: IV induction Ventilation: Mask ventilation without difficulty LMA: LMA inserted LMA Size: 4.5 Tube type: Oral Number of attempts: 1 Placement Confirmation: positive ETCO2 and breath sounds checked- equal and bilateral Tube secured with: Tape Dental Injury: Teeth and Oropharynx as per pre-operative assessment

## 2020-11-11 NOTE — Progress Notes (Signed)
4 Days Post-Op  ° °Subjective/Chief Complaint: °Patient seen with Dr. Ravishankar ° ° °Objective: °Vital signs in last 24 hours: °Temp:  [98 °F (36.7 °C)-99.3 °F (37.4 °C)] 98.5 °F (36.9 °C) (12/24 1216) °Pulse Rate:  [70-92] 70 (12/24 1216) °Resp:  [17-19] 19 (12/24 1216) °BP: (136-168)/(81-89) 136/82 (12/24 1216) °SpO2:  [97 %-100 %] 97 % (12/24 1216) °Weight:  [105.1 kg] 105.1 kg (12/24 0603) °Last BM Date: 11/04/20 ° °Intake/Output from previous day: °12/23 0701 - 12/24 0700 °In: 240 [P.O.:240] °Out: 1850 [Urine:1850] °Intake/Output this shift: °Total I/O °In: -  °Out: 800 [Urine:800] ° °Still some moderate to heavy drainage on the bandaging but decreasing some. Dorsal and distal incision still appears well coapted. Areas of necrosis tissue are noted plantarly in the arch but appear to be demarcating some. Still some purulence and necrosis tissue noted through the plantar open areas. Erythema seems to be stabilizing some in the arch. ° ° ° °Lab Results:  °Recent Labs  °  11/09/20 °0316 11/11/20 °0522  °WBC 11.7* 14.6*  °HGB 7.8* 8.2*  °HCT 24.8* 25.7*  °PLT 244 279  ° °BMET °Recent Labs  °  11/09/20 °0316 11/11/20 °0522  °NA 134* 133*  °K 3.2* 3.5  °CL 100 100  °CO2 26 23  °GLUCOSE 201* 184*  °BUN 12 12  °CREATININE 1.24 1.19  °CALCIUM 7.6* 7.7*  ° °PT/INR °No results for input(s): LABPROT, INR in the last 72 hours. °ABG °No results for input(s): PHART, HCO3 in the last 72 hours. 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 EXAM: MRI OF THE LEFT FOREFOOT WITHOUT AND WITH CONTRAST TECHNIQUE: Multiplanar, multisequence MR imaging of the left forefoot was performed both before and after administration of intravenous contrast. CONTRAST:  10mL GADAVIST GADOBUTROL 1 MMOL/ML IV SOLN COMPARISON:  X-ray 11/07/2020 FINDINGS:  Bones/Joint/Cartilage Interval third toe amputation with partial third ray resection at the level of the third metatarsal neck. There is marrow edema throughout the residual third metatarsal diaphysis with preservation of the fatty T1 marrow signal, likely reactive. There is marrow edema and enhancement with low T1 marrow signal changes along the lateral margin of the second metatarsal head (series 7, images 15-16; series 3, image 23) suspicious for osteomyelitis. There is mild marrow edema along the base of the second toe proximal phalanx as well as the fourth metatarsal head and base of fourth toe proximal phalanx without definite cortical destruction or low T1 marrow signal changes. Findings may reflect reactive osteitis. Early acute osteomyelitis at these locations is not excluded. Small second and fourth MTP joint effusions. Remaining osseous structures of the forefoot are within normal limits. No fractures. No dislocation. Ligaments Intact Lisfranc ligament. Collateral ligaments of the remaining metatarsophalangeal joints appear intact. Muscles and Tendons Diffuse edema-like signal throughout the intrinsic foot musculature suggestive of a myositis. No tenosynovial fluid collections. Soft tissues Surgical defect at the site of third toe amputation with ill-defined fluid and air within the surgical bed. There is a tract of air and fluid with peripherally enhancing margins along the plantar aspect of the forefoot extending from the level of the third metatarsal head and propagating proximally and medially to the level of the midfoot, likely site of surgical incision/debridement (series 14, images 14-20). Otherwise, there are no organized or rim enhancing fluid collections. IMPRESSION: 1. Interval third toe   amputation with partial third ray resection at the level of the third metatarsal neck. Marrow edema throughout the residual third metatarsal diaphysis is likely reactive. 2. Marrow edema and enhancement along  the lateral margin of the second metatarsal head is suspicious for acute osteomyelitis. 3. Mild marrow edema along the base of the second toe proximal phalanx as well as the fourth metatarsal head and base of the fourth toe proximal phalanx without definite cortical destruction or marrow replacement. Findings may reflect reactive osteitis. Early acute osteomyelitis at these locations is not excluded. 4. Small second and fourth MTP joint effusions, which may be reactive or reflect septic arthritis. 5. Extensive postsurgical changes from debridement of the plantar soft tissues of the left forefoot. No organized fluid collection or abscess. 6. Diffuse edema-like signal throughout the intrinsic foot musculature suggestive of a myositis. Electronically Signed   By: Nicholas  Plundo D.O.   On: 11/10/2020 12:58  ° ° °Anti-infectives: °Anti-infectives (From admission, onward)  ° Start     Dose/Rate Route Frequency Ordered Stop  ° 11/11/20 0800  cefTRIAXone (ROCEPHIN) 2 g in sodium chloride 0.9 % 100 mL IVPB       ° 2 g °200 mL/hr over 30 Minutes Intravenous Every 24 hours 11/11/20 0122    ° 11/09/20 0600  ceFEPIme (MAXIPIME) 2 g in sodium chloride 0.9 % 100 mL IVPB  Status:  Discontinued       ° 2 g °200 mL/hr over 30 Minutes Intravenous Every 8 hours 11/09/20 0009 11/11/20 0122  ° 11/09/20 0000  metroNIDAZOLE (FLAGYL) IVPB 500 mg       ° 500 mg °100 mL/hr over 60 Minutes Intravenous Every 8 hours 11/08/20 1954    ° 11/08/20 2200  ceFEPIme (MAXIPIME) 2 g in sodium chloride 0.9 % 100 mL IVPB  Status:  Discontinued       ° 2 g °200 mL/hr over 30 Minutes Intravenous Every 12 hours 11/08/20 1952 11/09/20 0009  ° 11/08/20 1800  vancomycin (VANCOREADY) IVPB 1250 mg/250 mL  Status:  Discontinued       ° 1,250 mg °166.7 mL/hr over 90 Minutes Intravenous Every 24 hours 11/07/20 1939 11/08/20 1258  ° 11/08/20 1800  Ampicillin-Sulbactam (UNASYN) 3 g in sodium chloride 0.9 % 100 mL IVPB  Status:  Discontinued       ° 3 g °200 mL/hr  over 30 Minutes Intravenous Every 6 hours 11/08/20 1636 11/08/20 1951  ° 11/08/20 1400  vancomycin (VANCOREADY) IVPB 750 mg/150 mL  Status:  Discontinued       ° 750 mg °150 mL/hr over 60 Minutes Intravenous Every 12 hours 11/08/20 1258 11/10/20 1030  ° 11/08/20 0800  piperacillin-tazobactam (ZOSYN) IVPB 3.375 g  Status:  Discontinued       ° 3.375 g °12.5 mL/hr over 240 Minutes Intravenous Every 8 hours 11/07/20 1939 11/08/20 1636  ° 11/07/20 2200  clindamycin (CLEOCIN) IVPB 600 mg  Status:  Discontinued       ° 600 mg °100 mL/hr over 30 Minutes Intravenous Every 8 hours 11/07/20 1924 11/08/20 0834  ° 11/07/20 1930  vancomycin (VANCOCIN) IVPB 1000 mg/200 mL premix  Status:  Discontinued       ° 1,000 mg °200 mL/hr over 60 Minutes Intravenous  Once 11/07/20 1924 11/07/20 2244  ° 11/07/20 1930  piperacillin-tazobactam (ZOSYN) IVPB 3.375 g  Status:  Discontinued       ° 3.375 g °100 mL/hr over 30 Minutes Intravenous  Once 11/07/20 1924 11/08/20 1636  ° 11/07/20   1815  vancomycin (VANCOREADY) IVPB 2000 mg/400 mL  Status:  Discontinued       ° 2,000 mg °200 mL/hr over 120 Minutes Intravenous STAT 11/07/20 1801 11/07/20 1802  ° 11/07/20 1815  vancomycin (VANCOCIN) IVPB 1000 mg/200 mL premix  Status:  Discontinued       ° 1,000 mg °200 mL/hr over 60 Minutes Intravenous STAT 11/07/20 1802 11/08/20 1258  ° 11/07/20 1800  ceFEPIme (MAXIPIME) 2 g in sodium chloride 0.9 % 100 mL IVPB       ° 2 g °200 mL/hr over 30 Minutes Intravenous STAT 11/07/20 1757 11/07/20 2303  °  ° ° °Assessment/Plan: °s/p Procedure(s): °INCISION AND DRAINAGE (Left) °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 (Left) °Assessment: Status post I&D with osteomyelitis.  ° °Plan: Discussed with the patient and his wife that he does still have continued infection in the third metatarsal. Discussed that the MRI did show that there could be some early osteomyelitis in the second and fourth metatarsals but also discussed that this could be reactive.  Discussed that we could consider redebridement of the left foot with removal of an additional portion of the third metatarsal to see if this will get the infection under control. Dr. Ravishankar in agreement. This would be his last attempt to try to salvage the foot and prevent below-knee amputation. Discussed the risks and complications including continued infection and if this does not get the infection under control he will need a below-knee amputation. They agree to proceed with the above plan. Consent for repeat I&D left foot. N.p.o. Plan for surgery later this afternoon. ° LOS: 4 days  ° ° °Jasminemarie Sherrard W Layney Gillson °11/11/2020 °

## 2020-11-11 NOTE — Progress Notes (Signed)
Report given to [facility] in [place]. Updated nurse about low blood sugar level and administration of 25mg  of D50. Patient has received pain medication.

## 2020-11-12 DIAGNOSIS — M726 Necrotizing fasciitis: Secondary | ICD-10-CM | POA: Diagnosis not present

## 2020-11-12 LAB — CBC WITH DIFFERENTIAL/PLATELET
Abs Immature Granulocytes: 0.28 10*3/uL — ABNORMAL HIGH (ref 0.00–0.07)
Basophils Absolute: 0.1 10*3/uL (ref 0.0–0.1)
Basophils Relative: 0 %
Eosinophils Absolute: 0 10*3/uL (ref 0.0–0.5)
Eosinophils Relative: 0 %
HCT: 27.2 % — ABNORMAL LOW (ref 39.0–52.0)
Hemoglobin: 8.8 g/dL — ABNORMAL LOW (ref 13.0–17.0)
Immature Granulocytes: 2 %
Lymphocytes Relative: 6 %
Lymphs Abs: 1 10*3/uL (ref 0.7–4.0)
MCH: 27.3 pg (ref 26.0–34.0)
MCHC: 32.4 g/dL (ref 30.0–36.0)
MCV: 84.5 fL (ref 80.0–100.0)
Monocytes Absolute: 0.6 10*3/uL (ref 0.1–1.0)
Monocytes Relative: 3 %
Neutro Abs: 15.8 10*3/uL — ABNORMAL HIGH (ref 1.7–7.7)
Neutrophils Relative %: 89 %
Platelets: 330 10*3/uL (ref 150–400)
RBC: 3.22 MIL/uL — ABNORMAL LOW (ref 4.22–5.81)
RDW: 15.6 % — ABNORMAL HIGH (ref 11.5–15.5)
WBC: 17.7 10*3/uL — ABNORMAL HIGH (ref 4.0–10.5)
nRBC: 0 % (ref 0.0–0.2)

## 2020-11-12 LAB — AEROBIC/ANAEROBIC CULTURE W GRAM STAIN (SURGICAL/DEEP WOUND)

## 2020-11-12 LAB — BASIC METABOLIC PANEL
Anion gap: 9 (ref 5–15)
BUN: 14 mg/dL (ref 6–20)
CO2: 23 mmol/L (ref 22–32)
Calcium: 8.3 mg/dL — ABNORMAL LOW (ref 8.9–10.3)
Chloride: 103 mmol/L (ref 98–111)
Creatinine, Ser: 1.16 mg/dL (ref 0.61–1.24)
GFR, Estimated: >60 mL/min (ref >60)
Glucose, Bld: 273 mg/dL — ABNORMAL HIGH (ref 70–99)
Potassium: 4.8 mmol/L (ref 3.5–5.1)
Sodium: 135 mmol/L (ref 135–145)

## 2020-11-12 LAB — ANAEROBIC CULTURE

## 2020-11-12 LAB — GLUCOSE, CAPILLARY
Glucose-Capillary: 261 mg/dL — ABNORMAL HIGH (ref 70–99)
Glucose-Capillary: 257 mg/dL — ABNORMAL HIGH (ref 70–99)
Glucose-Capillary: 251 mg/dL — ABNORMAL HIGH (ref 70–99)
Glucose-Capillary: 232 mg/dL — ABNORMAL HIGH (ref 70–99)
Glucose-Capillary: 254 mg/dL — ABNORMAL HIGH (ref 70–99)

## 2020-11-12 MED ORDER — SODIUM CHLORIDE 0.9 % IV SOLN
INTRAVENOUS | Status: DC | PRN
  Administered 2020-11-12 – 2020-11-13 (×2): 250 mL via INTRAVENOUS

## 2020-11-12 NOTE — Progress Notes (Addendum)
1 Day Post-Op   Subjective/Chief Complaint: Seen.  Still some significant pain in the left foot.   Objective: Vital signs in last 24 hours: Temp:  [97.6 F (36.4 C)-100 F (37.8 C)] 98 F (36.7 C) (12/25 1254) Pulse Rate:  [66-83] 69 (12/25 1254) Resp:  [13-26] 18 (12/25 1254) BP: (110-171)/(72-93) 144/89 (12/25 1254) SpO2:  [92 %-100 %] 98 % (12/25 1254) Weight:  [101.3 kg] 101.3 kg (12/25 0406) Last BM Date: 11/16/20  Intake/Output from previous day: 12/24 0701 - 12/25 0700 In: 900 [I.V.:700; IV Piggyback:200] Out: 3350 [Urine:3275; Blood:75] Intake/Output this shift: Total I/O In: 120 [P.O.:120] Out: 1150 [Urine:1150]  Heavy bleeding is noted on the bandage with minimal signs of purulence today.  Dorsal incision in between the toes is coapted.  Antibiotic beads with Mepitel and staples intact plantarly.  Erythema stable.      Lab Results:  Recent Labs    11/11/20 0522 11/12/20 0531  WBC 14.6* 17.7*  HGB 8.2* 8.8*  HCT 25.7* 27.2*  PLT 279 330   BMET Recent Labs    11/11/20 0522 11/12/20 0531  NA 133* 135  K 3.5 4.8  CL 100 103  CO2 23 23  GLUCOSE 184* 273*  BUN 12 14  CREATININE 1.19 1.16  CALCIUM 7.7* 8.3*   PT/INR No results for input(s): LABPROT, INR in the last 72 hours. ABG No results for input(s): PHART, HCO3 in the last 72 hours.  Invalid input(s): PCO2, PO2  Studies/Results: No results found.  Anti-infectives: Anti-infectives (From admission, onward)   Start     Dose/Rate Route Frequency Ordered Stop   11/11/20 2222  vancomycin (VANCOCIN) powder  Status:  Discontinued          As needed 11/11/20 2222 11/11/20 2310   11/11/20 1600  Ampicillin-Sulbactam (UNASYN) 3 g in sodium chloride 0.9 % 100 mL IVPB        3 g 200 mL/hr over 30 Minutes Intravenous Every 6 hours 11/11/20 1508     11/11/20 0800  cefTRIAXone (ROCEPHIN) 2 g in sodium chloride 0.9 % 100 mL IVPB  Status:  Discontinued        2 g 200 mL/hr over 30 Minutes Intravenous  Every 24 hours 11/11/20 0122 11/11/20 1506   11/09/20 0600  ceFEPIme (MAXIPIME) 2 g in sodium chloride 0.9 % 100 mL IVPB  Status:  Discontinued        2 g 200 mL/hr over 30 Minutes Intravenous Every 8 hours 11/09/20 0009 11/11/20 0122   11/09/20 0000  metroNIDAZOLE (FLAGYL) IVPB 500 mg  Status:  Discontinued        500 mg 100 mL/hr over 60 Minutes Intravenous Every 8 hours 11/08/20 1954 11/11/20 1507   11/08/20 2200  ceFEPIme (MAXIPIME) 2 g in sodium chloride 0.9 % 100 mL IVPB  Status:  Discontinued        2 g 200 mL/hr over 30 Minutes Intravenous Every 12 hours 11/08/20 1952 11/09/20 0009   11/08/20 1800  vancomycin (VANCOREADY) IVPB 1250 mg/250 mL  Status:  Discontinued        1,250 mg 166.7 mL/hr over 90 Minutes Intravenous Every 24 hours 11/07/20 1939 11/08/20 1258   11/08/20 1800  Ampicillin-Sulbactam (UNASYN) 3 g in sodium chloride 0.9 % 100 mL IVPB  Status:  Discontinued        3 g 200 mL/hr over 30 Minutes Intravenous Every 6 hours 11/08/20 1636 11/08/20 1951   11/08/20 1400  vancomycin (VANCOREADY) IVPB 750 mg/150 mL  Status:  Discontinued        750 mg 150 mL/hr over 60 Minutes Intravenous Every 12 hours 11/08/20 1258 11/10/20 1030   11/08/20 0800  piperacillin-tazobactam (ZOSYN) IVPB 3.375 g  Status:  Discontinued        3.375 g 12.5 mL/hr over 240 Minutes Intravenous Every 8 hours 11/07/20 1939 11/08/20 1636   11/07/20 2200  clindamycin (CLEOCIN) IVPB 600 mg  Status:  Discontinued        600 mg 100 mL/hr over 30 Minutes Intravenous Every 8 hours 11/07/20 1924 11/08/20 0834   11/07/20 1930  vancomycin (VANCOCIN) IVPB 1000 mg/200 mL premix  Status:  Discontinued        1,000 mg 200 mL/hr over 60 Minutes Intravenous  Once 11/07/20 1924 11/07/20 2244   11/07/20 1930  piperacillin-tazobactam (ZOSYN) IVPB 3.375 g  Status:  Discontinued        3.375 g 100 mL/hr over 30 Minutes Intravenous  Once 11/07/20 1924 11/08/20 1636   11/07/20 1815  vancomycin (VANCOREADY) IVPB 2000 mg/400  mL  Status:  Discontinued        2,000 mg 200 mL/hr over 120 Minutes Intravenous STAT 11/07/20 1801 11/07/20 1802   11/07/20 1815  vancomycin (VANCOCIN) IVPB 1000 mg/200 mL premix  Status:  Discontinued        1,000 mg 200 mL/hr over 60 Minutes Intravenous STAT 11/07/20 1802 11/08/20 1258   11/07/20 1800  ceFEPIme (MAXIPIME) 2 g in sodium chloride 0.9 % 100 mL IVPB        2 g 200 mL/hr over 30 Minutes Intravenous STAT 11/07/20 1757 11/07/20 2303      Assessment/Plan: s/p Procedure(s): IRRIGATION AND DEBRIDEMENT FOOT (Left) Assessment: 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  Condition still guarded.   Plan: Betadine applied to the incision areas followed by a bulky gauze bandage.  Continue on his antibiotics and we will monitor daily.  LOS: 5 days    Ricci Barker 11/12/2020

## 2020-11-12 NOTE — Progress Notes (Signed)
PROGRESS NOTE    Richard Mendoza  SWH:[id number] DOB: 1976/06/08 DOA: 11/07/2020 PCP: Tracie Harrier, MD   Brief Narrative:   44 y.o. 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  His blood glucose levels have been significantly elevated.  He admitted to fever and chills at home.  He has been having nausea without vomiting or diarrhea or abdominal pain.  He denied any dyspnea or cough or wheezing.  He admits to polyuria and polydipsia.  He was given doxycycline by Dr. Vickki Muff and has been on it.  His left foot pain has been worsening with associated erythema, swelling and radiation to his left leg.  No chest pain or dyspnea or palpitations.  No dysuria, oliguria or hematuria or flank pain.  Upon presentation to the emergency room, temperature was 101.2 heart rate of 104 and otherwise normal vital signs.  Labs revealed hyponatremia 121 with hypochloremia of 82, hypokalemia 3.4 and significant hyperglycemia blood glucose of 619 and CO2 of 25 with anion gap of 14.  Alk phos was 134 with albumin of 2.7 and troponin 8.5.  Lactic acid was 1.8 CBC showed leukocytosis of 14.5 with neutrophilia and anemia.  Influenza antigens and COVID-19 PCR came back negative.  Blood cultures were drawn.  Two-view chest x-ray showed diffuse subcutaneous gas along the plantar foot extending into the third toe with findings concerning for necrotizing fasciitis and no acute fracture or dislocation.  Dr. Caryl Comes was notified about the patient and evaluated him at bedside   12/22: Postop day #2.  Patient still with some significant pain.  Also endorsing back pain in addition to throbbing pain in the affected extremity. 12/23: Improved pain control  12/25: After discussion with podiatry and vascular surgery decision was made to  take patient back to the OR 11/11/2020 for repeat debridement in an attempt to salvage patient's foot and avoid a BKA.  Patient tolerated procedure well.  This morning patient still complaining of some throbbing pain of the left foot but otherwise stable.   Assessment & Plan:   Active Problems:   Necrotizing fasciitis of ankle and foot (HCC)  Left foot necrotizing fasciitis  with associated severe purulent left third toe cellulitisabscess.  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  Pain control improving over interval Group B strep noted on wound cultures No evidence of MRSA Podiatry saw patient 11/10/2020.  Unfortunately nonviable tissue in the foot.   -Initially plan was to proceed with BKA however after repeat discussions with podiatry and vascular surgery decision was made to return patient back to the OR on 11/11/2020 and attempt to salvage left foot and avoid BKA. 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  CT L spine ordered to r/o infection.  None noted Plan: Multimodal pain control  Nonketotic hyperosmolar hyperglycemia:  Poorly controlled diabetes mellitus  Last hemoglobin A1c 12 Weaned off of insulin drip.  Plan: Continue on lantus, aspart & SSI w/ accuchecks.  DM coordinator consulted.   Leukocytosis:  secondary to above infection. Continue on IV abxs    Chronic kidney disease stage IIIa:  Creatinine at baseline Monitor daily renal  function Avoid nonessential nephrotoxins  Peripheral neuropathy  continue on home dose of gabapentin   Hyponatremia , resolved likely pseudohyponatremia from hyperglycemia  Hypokalemia  KCl repleted. 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  No need for a transfusion at this time.  DVT prophylaxis: SQ Lovenox Code Status: Full Family Communication: Wife at bedside 11/11/2020 Disposition Plan:Status is: Inpatient  Remains inpatient appropriate because:Inpatient level of care appropriate due to severity of illness   Dispo: The patient is from: Home              Anticipated d/c is to: Unknown at this time              Anticipated d/c date is: > 3 days              Patient currently is not medically stable to d/c.   Status post return to the OR for repeat I&D and attempt to salvage left lower extremity.  Podiatry and vascular follow-up for wound checks and further management.  Consultants:   Podiatry  Infectious disease  Procedures:   Left foot I&D 11/07/2020  Antimicrobials:   Vancomycin  Cefepime  Metronidazole   Subjective: Patient seen and examined.  Postop day #1.  In good spirits this morning.  Complains of throbbing pain in left foot  Objective: Vitals:   11/11/20 2358 11/12/20 0008 11/12/20 0406 11/12/20 0803  BP:  134/83 130/83 (!) 156/93  Pulse: 70 71 67 66  Resp: _0 Temp: 98.6 F (37 C) 98.4 F (36.9 C) 97.6 F (36.4 C) 97.6 F (36.4 C)  TempSrc:  Oral Oral Oral  SpO2: 95% 100% 98% 92%  Weight:   101.3 kg   Height:        Intake/Output Summary (Last 24 hours) at 11/12/2020 1047 Last data filed at 11/12/2020 1011 Gross per 24 hour  Intake 1020 ml  Output 2550 ml  Net -1530 ml   Filed Weights   11/10/20 0302 11/11/20 0603 11/12/20 0406  Weight: 102.8 kg 105.1 kg 101.3 kg    Examination:  General exam: Appears calm and comfortable  Respiratory system: Clear to auscultation. Respiratory effort normal. Cardiovascular system: S1 & S2 heard, RRR. No JVD, murmurs, rubs, gallops or clicks. No pedal edema. Gastrointestinal system: Abdomen is nondistended, soft  and nontender. No organomegaly or masses felt. Normal bowel sounds heard. Central nervous system: Alert and oriented. No focal neurological deficits. Extremities: Symmetric 5 x 5 power.  Left foot in surgical wraps, not removed.  Dressings appear C/D/I Skin: No rashes, lesions or ulcers Psychiatry: Judgement and insight appear normal. Mood & affect appropriate.     Data Reviewed: I have personally reviewed following labs and imaging studies  CBC: Recent Labs  Lab 11/07/20 1758 11/08/20 0343 11/08/20 1206 11/09/20 0316 11/11/20 0522 11/12/20 0531  WBC 14.5* 11.8* 12.3* 11.7* 14.6* 17.7*  NEUTROABS 12.9*  --   --   --  11.3* 15.8*  HGB 10.1* 8.0* 7.9* 7.8* 8.2* 8.8*  HCT 31.6* 25.0* 24.9* 24.8* 25.7* 27.2*  MCV 83.6 83.6 84.1 85.2 84.3 84.5  PLT 346 266 250 244 279 287   Basic Metabolic Panel: Recent Labs  Lab 11/08/20 0726 11/08/20 1206 11/09/20 0316 11/11/20 0522 11/12/20 0531  NA 129* 130* 134* 133* 135  K 2.7* 2.9*  3.2* 3.5 4.8  CL 95* 96* 100 100 103  CO2 _0 GLUCOSE 168* 167* 201* 184* 273*  BUN _1 CREATININE 1.34* 1.35* 1.24 1.19 1.16  CALCIUM 7.8* 7.9* 7.6* 7.7* 8.3*   GFR: Estimated Creatinine Clearance: 96.9 mL/min (by C-G formula based on SCr of 1.16 mg/dL). Liver Function Tests: Recent Labs  Lab 11/07/20 1758  AST 25  ALT 17  ALKPHOS 134*  BILITOT 1.1  PROT 8.5*  ALBUMIN 2.7*   No results for input(s): LIPASE, AMYLASE in the last 168 hours. No results for input(s): AMMONIA in the last 168 hours. Coagulation Profile: Recent Labs  Lab 11/07/20 1758  INR 1.2   Cardiac Enzymes: No results for input(s): CKTOTAL, CKMB, CKMBINDEX, TROPONINI in the last 168 hours. BNP (last 3 results) No results for input(s): PROBNP in the last 8760 hours. HbA1C: No results for input(s): HGBA1C in the last 72 hours. CBG: Recent Labs  Lab 11/11/20 1951 11/11/20 2057 11/11/20 2316 11/12/20 0401 11/12/20 0808  GLUCAP 70 67* 83 261*  257*   Lipid Profile: No results for input(s): CHOL, HDL, LDLCALC, TRIG, CHOLHDL, LDLDIRECT in the last 72 hours. Thyroid Function Tests: No results for input(s): TSH, T4TOTAL, FREET4, T3FREE, THYROIDAB in the last 72 hours. Anemia Panel: No results for input(s): VITAMINB12, FOLATE, FERRITIN, TIBC, IRON, RETICCTPCT in the last 72 hours. Sepsis Labs: Recent Labs  Lab 11/07/20 1758 11/07/20 2343  LATICACIDVEN 1.8 2.8*    Recent Results (from the past 240 hour(s))  Urine culture     Status: None   Collection Time: 11/07/20  5:49 PM   Specimen: In/Out Cath Urine  Result Value Ref Range Status   Specimen Description   Final    IN/OUT CATH URINE Performed at [facility], [address], [place], [place] [address]    Special Requests   Final    NONE Performed at [facility], [address] [place], [place] [address]    Culture   Final    NO GROWTH Performed at [facility], [place] [address] [place], [place] [address]    Report Status 11/08/2020 FINAL  Final  Blood Culture (routine x 2)     Status: Abnormal   Collection Time: 11/07/20  5:58 PM   Specimen: BLOOD  Result Value Ref Range Status   Specimen Description   Final    BLOOD RIGHT ANTECUBITAL Performed at [facility], [address] [place], [place] [address]    Special Requests   Final    BOTTLES DRAWN AEROBIC AND ANAEROBIC Blood Culture results may not be optimal due to an excessive volume of blood received in culture bottles Performed at [facility], [address] [place] [address], [place], [place] [address]    Culture  Setup Time   Final    GRAM POSITIVE COCCI IN BOTH AEROBIC AND ANAEROBIC BOTTLES CRITICAL VALUE NOTED.  VALUE IS CONSISTENT WITH PREVIOUSLY REPORTED AND CALLED VALUE.    Culture (A)  Final    GROUP B STREP(S.AGALACTIAE)ISOLATED SUSCEPTIBILITIES PERFORMED ON PREVIOUS CULTURE WITHIN THE LAST 5 DAYS. Performed at [facility], [place] [address], [place],  [place] [address]    Report Status 11/11/2020 FINAL  Final  Blood Culture (routine x 2)     Status: Abnormal   Collection Time: 11/07/20  5:58 PM   Specimen: BLOOD  Result Value Ref Range Status   Specimen Description   Final    BLOOD  BLOOD LEFT FOREARM Performed at [facility], [address] [place], [place] [address]    Special Requests   Final    BOTTLES DRAWN AEROBIC AND ANAEROBIC Blood Culture adequate volume Performed at [facility], [address] [place], [place] [address]    Culture  Setup Time   Final    Organism ID to follow GRAM POSITIVE COCCI IN BOTH AEROBIC AND ANAEROBIC BOTTLES CRITICAL RESULT CALLED TO, READ BACK BY AND VERIFIED WITH: SUSAN WATSON AT 8828 12/21/821 [place] Performed at [facility], [address] [place], [place] [address]    Culture GROUP B STREP(S.AGALACTIAE)ISOLATED (A)  Final   Report Status 11/10/2020 FINAL  Final   Organism ID, Bacteria GROUP B STREP(S.AGALACTIAE)ISOLATED  Final      Susceptibility   Group b strep(s.agalactiae)isolated - MIC*    CLINDAMYCIN >=1 RESISTANT Resistant     AMPICILLIN <=0.25 SENSITIVE Sensitive     ERYTHROMYCIN 2 RESISTANT Resistant     VANCOMYCIN 0.5 SENSITIVE Sensitive     CEFTRIAXONE <=0.12 SENSITIVE Sensitive     LEVOFLOXACIN 1 SENSITIVE Sensitive     PENICILLIN Value in next row Sensitive      SENSITIVE<=0.06    * GROUP B STREP(S.AGALACTIAE)ISOLATED  Resp Panel by RT-PCR (Flu A&B, Covid) Nasopharyngeal Swab     Status: None   Collection Time: 11/07/20  5:58 PM   Specimen: Nasopharyngeal Swab; Nasopharyngeal(NP) swabs in vial transport medium  Result Value Ref Range Status   SARS Coronavirus 2 by RT PCR NEGATIVE NEGATIVE Final    Comment: (NOTE) SARS-CoV-2 target nucleic acids are NOT DETECTED.  The SARS-CoV-2 RNA is generally detectable in upper respiratory specimens during the acute phase of infection. The lowest concentration of SARS-CoV-2 viral copies this assay can detect  is 138 copies/mL. A negative result does not preclude SARS-Cov-2 infection and should not be used as the sole basis for treatment or other patient management decisions. A negative result may occur with  improper specimen collection/handling, submission of specimen other than nasopharyngeal swab, presence of viral mutation(s) within the areas targeted by this assay, and inadequate number of viral copies(<138 copies/mL). A negative result must be combined with clinical observations, patient history, and epidemiological information. The expected result is Negative.  Fact Sheet for Patients:  EntrepreneurPulse.com.au  Fact Sheet for Healthcare Providers:  IncredibleEmployment.be  This test is no t yet approved or cleared by the [place] FDA and  has been authorized for detection and/or diagnosis of SARS-CoV-2 by FDA under an Emergency Use Authorization (EUA). This EUA will remain  in effect (meaning this test can be used) for the duration of the COVID-19 declaration under Section 564(b)(1) of the Act, 21 U.S.C.section 360bbb-3(b)(1), unless the authorization is terminated  or revoked sooner.       Influenza A by PCR NEGATIVE NEGATIVE Final   Influenza B by PCR NEGATIVE NEGATIVE Final    Comment: (NOTE) The Xpert Xpress SARS-CoV-2/FLU/RSV plus assay is intended as an aid in the diagnosis of influenza from Nasopharyngeal swab specimens and should not be used as a sole basis for treatment. Nasal washings and aspirates are unacceptable for Xpert Xpress SARS-CoV-2/FLU/RSV testing.  Fact Sheet for Patients: EntrepreneurPulse.com.au  Fact Sheet for Healthcare Providers: IncredibleEmployment.be  This test is not yet approved or cleared by the [place] FDA and has been authorized for detection and/or diagnosis of SARS-CoV-2 by FDA under an Emergency Use Authorization (EUA). This EUA will remain in effect  (meaning this test can be used)  for the duration of the COVID-19 declaration under Section 564(b)(1) of the Act, 21 U.S.C. section 360bbb-3(b)(1), unless the authorization is terminated or revoked.  Performed at [facility], [address] [place], [place] [address]   Blood Culture ID Panel (Reflexed)     Status: Abnormal   Collection Time: 11/07/20  5:58 PM  Result Value Ref Range Status   Enterococcus faecalis NOT DETECTED NOT DETECTED Final   Enterococcus Faecium NOT DETECTED NOT DETECTED Final   Listeria monocytogenes NOT DETECTED NOT DETECTED Final   Staphylococcus species NOT DETECTED NOT DETECTED Final   Staphylococcus aureus (BCID) NOT DETECTED NOT DETECTED Final   Staphylococcus epidermidis NOT DETECTED NOT DETECTED Final   Staphylococcus lugdunensis NOT DETECTED NOT DETECTED Final   Streptococcus species DETECTED (A) NOT DETECTED Final    Comment: CRITICAL RESULT CALLED TO, READ BACK BY AND VERIFIED WITH:  SUSAN WATSON AT 1307 11/08/20 SDR    Streptococcus agalactiae DETECTED (A) NOT DETECTED Final    Comment: CRITICAL RESULT CALLED TO, READ BACK BY AND VERIFIED WITH:  SUSAN WATSON AT 1307 11/08/20 SDR    Streptococcus pneumoniae NOT DETECTED NOT DETECTED Final   Streptococcus pyogenes NOT DETECTED NOT DETECTED Final   A.calcoaceticus-baumannii NOT DETECTED NOT DETECTED Final   Bacteroides fragilis NOT DETECTED NOT DETECTED Final   Enterobacterales NOT DETECTED NOT DETECTED Final   Enterobacter cloacae complex NOT DETECTED NOT DETECTED Final   Escherichia coli NOT DETECTED NOT DETECTED Final   Klebsiella aerogenes NOT DETECTED NOT DETECTED Final   Klebsiella oxytoca NOT DETECTED NOT DETECTED Final   Klebsiella pneumoniae NOT DETECTED NOT DETECTED Final   Proteus species NOT DETECTED NOT DETECTED Final   Salmonella species NOT DETECTED NOT DETECTED Final   Serratia marcescens NOT DETECTED NOT DETECTED Final   Haemophilus influenzae NOT DETECTED NOT DETECTED  Final   Neisseria meningitidis NOT DETECTED NOT DETECTED Final   Pseudomonas aeruginosa NOT DETECTED NOT DETECTED Final   Stenotrophomonas maltophilia NOT DETECTED NOT DETECTED Final   Candida albicans NOT DETECTED NOT DETECTED Final   Candida auris NOT DETECTED NOT DETECTED Final   Candida glabrata NOT DETECTED NOT DETECTED Final   Candida krusei NOT DETECTED NOT DETECTED Final   Candida parapsilosis NOT DETECTED NOT DETECTED Final   Candida tropicalis NOT DETECTED NOT DETECTED Final   Cryptococcus neoformans/gattii NOT DETECTED NOT DETECTED Final    Comment: Performed at [facility], [address] [place], [place] [address]  MRSA PCR Screening     Status: None   Collection Time: 11/07/20  8:03 PM   Specimen: Nasopharyngeal  Result Value Ref Range Status   MRSA by PCR NEGATIVE NEGATIVE Final    Comment:        The GeneXpert MRSA Assay (FDA approved for NASAL specimens only), is one component of a comprehensive MRSA colonization surveillance program. It is not intended to diagnose MRSA infection nor to guide or monitor treatment for MRSA infections. Performed at [facility], [address] [place], [place] [address]   Anaerobic culture     Status: Abnormal   Collection Time: 11/07/20  8:13 PM   Specimen: PATH Other  Result Value Ref Range Status   Specimen Description   Final    TOE LEFT Performed at [facility], [address] [place],  [address]    Special Requests BONE  Final   Gram Stain   Final    RARE WBC PRESENT,BOTH PMN AND MONONUCLEAR MODERATE GRAM POSITIVE COCCI IN  PAIRS IN CLUSTERS    Culture (A)  Final    PREVOTELLA DISIENS BETA LACTAMASE POSITIVE Performed at [facility], [place] [address] [place], [place] [address]    Report Status 11/12/2020 FINAL  Final  Aerobic/Anaerobic Culture (surgical/deep wound)     Status: None (Preliminary result)   Collection Time: 11/07/20  8:13 PM   Specimen: PATH Other;  Abscess  Result Value Ref Range Status   Specimen Description   Final    ABSCESS Performed at [facility], [address] [place], [place] [address]    Special Requests   Final    LEFT FOOT Performed at [facility], [place], [place] [address]    Gram Stain   Final    RARE WBC PRESENT,BOTH PMN AND MONONUCLEAR ABUNDANT GRAM POSITIVE COCCI IN PAIRS IN CLUSTERS    Culture   Final    FEW GROUP B STREP(S.AGALACTIAE)ISOLATED TESTING AGAINST S. AGALACTIAE NOT ROUTINELY PERFORMED DUE TO PREDICTABILITY OF AMP/PEN/VAN SUSCEPTIBILITY. FEW PREVOTELLA DISIENS BETA LACTAMASE POSITIVE Performed at [facility], [place] [address] [place], [place] [address]    Report Status PENDING  Incomplete  Aerobic Culture (superficial specimen)     Status: None   Collection Time: 11/07/20  8:13 PM   Specimen: PATH Other  Result Value Ref Range Status   Specimen Description   Final    ABSCESS Performed at [facility], [address] [place], [place] [address]    Special Requests LEFT FOOT BONE  Final   Gram Stain   Final    NO WBC SEEN FEW GRAM POSITIVE COCCI IN PAIRS IN CLUSTERS    Culture   Final    MODERATE GROUP B STREP(S.AGALACTIAE)ISOLATED TESTING AGAINST S. AGALACTIAE NOT ROUTINELY PERFORMED DUE TO PREDICTABILITY OF AMP/PEN/VAN SUSCEPTIBILITY. Performed at [facility], [place] [address] [place], [place] [address]    Report Status 11/10/2020 FINAL  Final  CULTURE, BLOOD (ROUTINE X 2) w Reflex to ID Panel     Status: None (Preliminary result)   Collection Time: 11/11/20 12:48 PM   Specimen: BLOOD  Result Value Ref Range Status   Specimen Description BLOOD BLOOD RIGHT HAND  Final   Special Requests   Final    BOTTLES DRAWN AEROBIC AND ANAEROBIC Blood Culture adequate volume   Culture   Final    NO GROWTH < 24 HOURS Performed at [facility], [address], [place], [place] [address]    Report Status PENDING  Incomplete   CULTURE, BLOOD (ROUTINE X 2) w Reflex to ID Panel     Status: None (Preliminary result)   Collection Time: 11/11/20  1:06 PM   Specimen: BLOOD  Result Value Ref Range Status   Specimen Description BLOOD LEFT ANTECUBITAL  Final   Special Requests   Final    BOTTLES DRAWN AEROBIC AND ANAEROBIC Blood Culture adequate volume   Culture   Final    NO GROWTH < 24 HOURS Performed at [facility], [address], [place], [place] [address]    Report Status PENDING  Incomplete         Radiology Studies: MR FOOT LEFT W WO CONTRAST  Result Date: 11/10/2020 CLINICAL DATA:  Diabetic left foot wound. Left third toe amputation with partial ray resection with wound debridement on 11/07/2020. EXAM: MRI OF THE LEFT FOREFOOT WITHOUT AND WITH CONTRAST TECHNIQUE: Multiplanar, multisequence MR imaging of the left forefoot was performed both before and after administration of  intravenous contrast. CONTRAST:  88m GADAVIST GADOBUTROL 1 MMOL/ML IV SOLN COMPARISON:  X-ray 11/07/2020 FINDINGS: Bones/Joint/Cartilage Interval third toe amputation with partial third ray resection at the level of the third metatarsal neck. There is marrow edema throughout the residual third metatarsal diaphysis with preservation of the fatty T1 marrow signal, likely reactive. There is marrow edema and enhancement with low T1 marrow signal changes along the lateral margin of the second metatarsal head (series 7, images 15-16; series 3, image 23) suspicious for osteomyelitis. There is mild marrow edema along the base of the second toe proximal phalanx as well as the fourth metatarsal head and base of fourth toe proximal phalanx without definite cortical destruction or low T1 marrow signal changes. Findings may reflect reactive osteitis. Early acute osteomyelitis at these locations is not excluded. Small second and fourth MTP joint effusions. Remaining osseous structures of the forefoot are within normal limits. No fractures. No  dislocation. Ligaments Intact Lisfranc ligament. Collateral ligaments of the remaining metatarsophalangeal joints appear intact. Muscles and Tendons Diffuse edema-like signal throughout the intrinsic foot musculature suggestive of a myositis. No tenosynovial fluid collections. Soft tissues Surgical defect at the site of third toe amputation with ill-defined fluid and air within the surgical bed. There is a tract of air and fluid with peripherally enhancing margins along the plantar aspect of the forefoot extending from the level of the third metatarsal head and propagating proximally and medially to the level of the midfoot, likely site of surgical incision/debridement (series 14, images 14-20). Otherwise, there are no organized or rim enhancing fluid collections. IMPRESSION: 1. Interval third toe amputation with partial third ray resection at the level of the third metatarsal neck. Marrow edema throughout the residual third metatarsal diaphysis is likely reactive. 2. Marrow edema and enhancement along the lateral margin of the second metatarsal head is suspicious for acute osteomyelitis. 3. Mild marrow edema along the base of the second toe proximal phalanx as well as the fourth metatarsal head and base of the fourth toe proximal phalanx without definite cortical destruction or marrow replacement. Findings may reflect reactive osteitis. Early acute osteomyelitis at these locations is not excluded. 4. Small second and fourth MTP joint effusions, which may be reactive or reflect septic arthritis. 5. Extensive postsurgical changes from debridement of the plantar soft tissues of the left forefoot. No organized fluid collection or abscess. 6. Diffuse edema-like signal throughout the intrinsic foot musculature suggestive of a myositis. Electronically Signed   By: NDavina PokeD.O.   On: 11/10/2020 12:58        Scheduled Meds: . acetaminophen  650 mg Oral Q6H  . atorvastatin  40 mg Oral Daily  . busPIRone   15 mg Oral BID  . Chlorhexidine Gluconate Cloth  6 each Topical Daily  . dicyclomine  10 mg Oral TID AC & HS  . enoxaparin (LOVENOX) injection  50 mg Subcutaneous Daily  . famotidine  20 mg Oral BID  . ferrous sulfate  325 mg Oral BID WC  . gabapentin  600 mg Oral QHS  . insulin aspart  0-20 Units Subcutaneous TID AC & HS  . insulin aspart  5 Units Subcutaneous TID WC  . insulin glargine  75 Units Subcutaneous Daily  . lidocaine  1 patch Transdermal Q24H  . methocarbamol  750 mg Oral QID  . metoCLOPramide  10 mg Oral TID AC  . multivitamin with minerals  1 tablet Oral Daily  . pantoprazole  40 mg Oral Daily  . sertraline  100 mg Oral Daily  . vitamin B-12  500 mcg Oral Daily   Continuous Infusions: . ampicillin-sulbactam (UNASYN) IV 3 g (11/12/20 0900)  . lactated ringers    . lactated ringers       LOS: 5 days    Time spent: 15 minutes    Sidney Ace, MD [facility] Pager [phone]  If 7PM-7AM, please contact night-coverage 11/12/2020, 10:47 AM

## 2020-11-12 NOTE — Anesthesia Postprocedure Evaluation (Signed)
Anesthesia Post Note  Patient: Richard Mendoza  Procedure(s) Performed: IRRIGATION AND DEBRIDEMENT FOOT (Left Foot)  Patient location during evaluation: PACU Anesthesia Type: General Level of consciousness: awake and alert Pain management: pain level controlled Vital Signs Assessment: post-procedure vital signs reviewed and stable Respiratory status: spontaneous breathing, nonlabored ventilation, respiratory function stable and patient connected to nasal cannula oxygen Cardiovascular status: blood pressure returned to baseline and stable Postop Assessment: no apparent nausea or vomiting Anesthetic complications: no   No complications documented.   Last Vitals:  Vitals:   11/11/20 2358 11/12/20 0008  BP:  134/83  Pulse: 70 71  Resp: 18 18  Temp: 37 C 36.9 C  SpO2: 95% 100%    Last Pain:  Vitals:   11/12/20 0024  TempSrc:   PainSc: 2                  Corinda Gubler

## 2020-11-13 ENCOUNTER — Encounter: Payer: Self-pay | Admitting: Podiatry

## 2020-11-13 DIAGNOSIS — M726 Necrotizing fasciitis: Secondary | ICD-10-CM | POA: Diagnosis not present

## 2020-11-13 LAB — CBC WITH DIFFERENTIAL/PLATELET
Abs Immature Granulocytes: 0.27 10*3/uL — ABNORMAL HIGH (ref 0.00–0.07)
Basophils Absolute: 0 10*3/uL (ref 0.0–0.1)
Basophils Relative: 0 %
Eosinophils Absolute: 0.1 10*3/uL (ref 0.0–0.5)
Eosinophils Relative: 1 %
HCT: 24.3 % — ABNORMAL LOW (ref 39.0–52.0)
Hemoglobin: 7.4 g/dL — ABNORMAL LOW (ref 13.0–17.0)
Immature Granulocytes: 2 %
Lymphocytes Relative: 20 %
Lymphs Abs: 2.4 10*3/uL (ref 0.7–4.0)
MCH: 26.5 pg (ref 26.0–34.0)
MCHC: 30.5 g/dL (ref 30.0–36.0)
MCV: 87.1 fL (ref 80.0–100.0)
Monocytes Absolute: 0.7 10*3/uL (ref 0.1–1.0)
Monocytes Relative: 6 %
Neutro Abs: 8.5 10*3/uL — ABNORMAL HIGH (ref 1.7–7.7)
Neutrophils Relative %: 71 %
Platelets: 320 10*3/uL (ref 150–400)
RBC: 2.79 MIL/uL — ABNORMAL LOW (ref 4.22–5.81)
RDW: 15.7 % — ABNORMAL HIGH (ref 11.5–15.5)
WBC: 12.1 10*3/uL — ABNORMAL HIGH (ref 4.0–10.5)
nRBC: 0 % (ref 0.0–0.2)

## 2020-11-13 LAB — GLUCOSE, CAPILLARY
Glucose-Capillary: 271 mg/dL — ABNORMAL HIGH (ref 70–99)
Glucose-Capillary: 357 mg/dL — ABNORMAL HIGH (ref 70–99)
Glucose-Capillary: 228 mg/dL — ABNORMAL HIGH (ref 70–99)
Glucose-Capillary: 173 mg/dL — ABNORMAL HIGH (ref 70–99)

## 2020-11-13 LAB — BASIC METABOLIC PANEL
Anion gap: 6 (ref 5–15)
BUN: 18 mg/dL (ref 6–20)
CO2: 25 mmol/L (ref 22–32)
Calcium: 7.7 mg/dL — ABNORMAL LOW (ref 8.9–10.3)
Chloride: 106 mmol/L (ref 98–111)
Creatinine, Ser: 1.17 mg/dL (ref 0.61–1.24)
GFR, Estimated: >60 mL/min (ref >60)
Glucose, Bld: 279 mg/dL — ABNORMAL HIGH (ref 70–99)
Potassium: 3.7 mmol/L (ref 3.5–5.1)
Sodium: 137 mmol/L (ref 135–145)

## 2020-11-13 NOTE — Progress Notes (Signed)
2 Days Post-Op   Subjective/Chief Complaint: Patient seen. Still having a lot of pain in his left foot.   Objective: Vital signs in last 24 hours: Temp:  [97.8 F (36.6 C)-98.6 F (37 C)] 98.2 F (36.8 C) (12/26 1142) Pulse Rate:  [69-94] 70 (12/26 1142) Resp:  [18-19] 19 (12/26 1142) BP: (115-144)/(72-90) 127/82 (12/26 1142) SpO2:  [97 %-100 %] 99 % (12/26 1142) Last BM Date: 11/13/20  Intake/Output from previous day: 12/25 0701 - 12/26 0700 In: 2603.6 [P.O.:120; I.V.:1950.4; IV Piggyback:533.2] Out: 3300 [Urine:3300] Intake/Output this shift: Total I/O In: 120 [P.O.:120] Out: 225 [Urine:225]  Still heavy bleeding and drainage noted on the bandaging. There is some increased purulence on the bandaging today upon removal. Some increased erythema at the wound especially in the plantar heel area. Dorsal incision appears well coapted      Lab Results:  Recent Labs    11/12/20 0531 11/13/20 0412  WBC 17.7* 12.1*  HGB 8.8* 7.4*  HCT 27.2* 24.3*  PLT 330 320   BMET Recent Labs    11/12/20 0531 11/13/20 0412  NA 135 137  K 4.8 3.7  CL 103 106  CO2 23 25  GLUCOSE 273* 279*  BUN 14 18  CREATININE 1.16 1.17  CALCIUM 8.3* 7.7*   PT/INR No results for input(s): LABPROT, INR in the last 72 hours. ABG No results for input(s): PHART, HCO3 in the last 72 hours.  Invalid input(s): PCO2, PO2  Studies/Results: No results found.  Anti-infectives: Anti-infectives (From admission, onward)   Start     Dose/Rate Route Frequency Ordered Stop   11/11/20 2222  vancomycin (VANCOCIN) powder  Status:  Discontinued          As needed 11/11/20 2222 11/11/20 2310   11/11/20 1600  Ampicillin-Sulbactam (UNASYN) 3 g in sodium chloride 0.9 % 100 mL IVPB        3 g 200 mL/hr over 30 Minutes Intravenous Every 6 hours 11/11/20 1508     11/11/20 0800  cefTRIAXone (ROCEPHIN) 2 g in sodium chloride 0.9 % 100 mL IVPB  Status:  Discontinued        2 g 200 mL/hr over 30 Minutes  Intravenous Every 24 hours 11/11/20 0122 11/11/20 1506   11/09/20 0600  ceFEPIme (MAXIPIME) 2 g in sodium chloride 0.9 % 100 mL IVPB  Status:  Discontinued        2 g 200 mL/hr over 30 Minutes Intravenous Every 8 hours 11/09/20 0009 11/11/20 0122   11/09/20 0000  metroNIDAZOLE (FLAGYL) IVPB 500 mg  Status:  Discontinued        500 mg 100 mL/hr over 60 Minutes Intravenous Every 8 hours 11/08/20 1954 11/11/20 1507   11/08/20 2200  ceFEPIme (MAXIPIME) 2 g in sodium chloride 0.9 % 100 mL IVPB  Status:  Discontinued        2 g 200 mL/hr over 30 Minutes Intravenous Every 12 hours 11/08/20 1952 11/09/20 0009   11/08/20 1800  vancomycin (VANCOREADY) IVPB 1250 mg/250 mL  Status:  Discontinued        1,250 mg 166.7 mL/hr over 90 Minutes Intravenous Every 24 hours 11/07/20 1939 11/08/20 1258   11/08/20 1800  Ampicillin-Sulbactam (UNASYN) 3 g in sodium chloride 0.9 % 100 mL IVPB  Status:  Discontinued        3 g 200 mL/hr over 30 Minutes Intravenous Every 6 hours 11/08/20 1636 11/08/20 1951   11/08/20 1400  vancomycin (VANCOREADY) IVPB 750 mg/150 mL  Status:  Discontinued  750 mg 150 mL/hr over 60 Minutes Intravenous Every 12 hours 11/08/20 1258 11/10/20 1030   11/08/20 0800  piperacillin-tazobactam (ZOSYN) IVPB 3.375 g  Status:  Discontinued        3.375 g 12.5 mL/hr over 240 Minutes Intravenous Every 8 hours 11/07/20 1939 11/08/20 1636   11/07/20 2200  clindamycin (CLEOCIN) IVPB 600 mg  Status:  Discontinued        600 mg 100 mL/hr over 30 Minutes Intravenous Every 8 hours 11/07/20 1924 11/08/20 0834   11/07/20 1930  vancomycin (VANCOCIN) IVPB 1000 mg/200 mL premix  Status:  Discontinued        1,000 mg 200 mL/hr over 60 Minutes Intravenous  Once 11/07/20 1924 11/07/20 2244   11/07/20 1930  piperacillin-tazobactam (ZOSYN) IVPB 3.375 g  Status:  Discontinued        3.375 g 100 mL/hr over 30 Minutes Intravenous  Once 11/07/20 1924 11/08/20 1636   11/07/20 1815  vancomycin (VANCOREADY) IVPB  2000 mg/400 mL  Status:  Discontinued        2,000 mg 200 mL/hr over 120 Minutes Intravenous STAT 11/07/20 1801 11/07/20 1802   11/07/20 1815  vancomycin (VANCOCIN) IVPB 1000 mg/200 mL premix  Status:  Discontinued        1,000 mg 200 mL/hr over 60 Minutes Intravenous STAT 11/07/20 1802 11/08/20 1258   11/07/20 1800  ceFEPIme (MAXIPIME) 2 g in sodium chloride 0.9 % 100 mL IVPB        2 g 200 mL/hr over 30 Minutes Intravenous STAT 11/07/20 1757 11/07/20 2303      Assessment/Plan: s/p Procedure(s): IRRIGATION AND DEBRIDEMENT FOOT (Left) 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   Plan: Betadine applied along the incision areas followed by a bulky gauze bandage with ABDs. Discussed with the patient and his wife that with the increased purulence and redness that there still appears to be significant infection in the foot and most likely I think we should proceed with amputation of the left lower extremity as I do not think that this is a salvageable foot. I will discuss with vascular surgery tomorrow.  LOS: 6 days    Ricci Barker 11/13/2020

## 2020-11-13 NOTE — Progress Notes (Signed)
PROGRESS NOTE    Tracey Stewart  JTT:[id number] DOB: [birth year] DOA: 11/07/2020 PCP: Tracie Harrier, MD   Brief Narrative:   44 y.o. 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  His blood glucose levels have been significantly elevated.  He admitted to fever and chills at home.  He has been having nausea without vomiting or diarrhea or abdominal pain.  He denied any dyspnea or cough or wheezing.  He admits to polyuria and polydipsia.  He was given doxycycline by Dr. Vickki Muff and has been on it.  His left foot pain has been worsening with associated erythema, swelling and radiation to his left leg.  No chest pain or dyspnea or palpitations.  No dysuria, oliguria or hematuria or flank pain.  Upon presentation to the emergency room, temperature was 101.2 heart rate of 104 and otherwise normal vital signs.  Labs revealed hyponatremia 121 with hypochloremia of 82, hypokalemia 3.4 and significant hyperglycemia blood glucose of 619 and CO2 of 25 with anion gap of 14.  Alk phos was 134 with albumin of 2.7 and troponin 8.5.  Lactic acid was 1.8 CBC showed leukocytosis of 14.5 with neutrophilia and anemia.  Influenza antigens and COVID-19 PCR came back negative.  Blood cultures were drawn.  Two-view chest x-ray showed diffuse subcutaneous gas along the plantar foot extending into the third toe with findings concerning for necrotizing fasciitis and no acute fracture or dislocation.  Dr. Caryl Comes was notified about the patient and evaluated him at bedside   12/22: Postop day #2.  Patient still with some significant pain.  Also endorsing back pain in addition to throbbing pain in the affected extremity. 12/23: Improved pain control  12/25: After discussion with podiatry and vascular surgery decision was made to  take patient back to the OR 11/11/2020 for repeat debridement in an attempt to salvage patient's foot and avoid a BKA.  Patient tolerated procedure well.  This morning patient still complaining of some throbbing pain of the left foot but otherwise stable.   Assessment & Plan:   Active Problems:   Necrotizing fasciitis of ankle and foot (HCC)  Left foot necrotizing fasciitis  with associated severe purulent left third toe cellulitisabscess.  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  Pain control improving over interval Group B strep noted on wound cultures No evidence of MRSA Podiatry saw patient 11/10/2020.  Unfortunately nonviable tissue in the foot.   -Initially plan was to proceed with BKA however after repeat discussions with podiatry and vascular surgery decision was made to return patient back to the OR on 11/11/2020 and attempt to salvage left foot and avoid BKA. Plan: Podiatry and vascular to follow for postoperative care and further consideration for BKA.  Continue Unasyn per ID recommendations.  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  CT L spine ordered to r/o infection.  None noted Plan: Multimodal pain control  Nonketotic hyperosmolar hyperglycemia:  Poorly controlled diabetes mellitus  Last hemoglobin A1c 12 Weaned off of insulin drip.  Plan: Continue on lantus, aspart & SSI w/ accuchecks.  DM coordinator consulted.   Leukocytosis:  secondary to above infection. Continue on IV abxs    Chronic kidney disease stage IIIa:  Creatinine at baseline Monitor daily renal function Avoid nonessential nephrotoxins  Peripheral neuropathy  continue on home dose of gabapentin   Hyponatremia , resolved likely pseudohyponatremia from hyperglycemia  Hypokalemia  KCl repleted. 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  No need for a transfusion at this time.  DVT prophylaxis: SQ Lovenox Code Status: Full Family Communication: Wife at bedside 11/11/2020 Disposition Plan:Status is: Inpatient  Remains inpatient appropriate because:Inpatient level of care appropriate due to severity of illness   Dispo: The patient is from: Home              Anticipated d/c is to: Unknown at this time              Anticipated d/c date is: > 3 days              Patient currently is not medically stable to d/c.   Status post return to the OR for repeat I&D and attempt to salvage left lower extremity.  Podiatry and vascular follow-up for wound checks and further management.  Consultants:   Podiatry  Infectious disease  Procedures:   Left foot I&D 11/07/2020  Antimicrobials:   Vancomycin  Cefepime  Metronidazole   Subjective: Patient seen and examined.  Postop day 2 status post return to the OR.  In good spirits.  Pain seems better controlled. Objective: Vitals:   11/12/20 1603 11/12/20 2014 11/13/20 0643 11/13/20 1051  BP: 139/90 115/72 124/82 126/79  Pulse: 80 73 76 94  Resp: _0 Temp: 97.8 F (36.6 C) 98.3 F (36.8 C) 98.6 F (37 C) 98.5 F (36.9 C)  TempSrc: Oral Oral Oral   SpO2: 100% 99% 100% 97%  Weight:      Height:        Intake/Output Summary (Last 24 hours) at 11/13/2020 1129 Last data filed at 11/13/2020 1031 Gross per 24 hour  Intake 2603.59 ml  Output 2875 ml  Net -271.41 ml   Filed Weights   11/10/20 0302 11/11/20 0603 11/12/20 0406  Weight: 102.8 kg 105.1 kg 101.3 kg    Examination:  General exam: Appears calm and comfortable  Respiratory system: Clear to auscultation. Respiratory effort normal. Cardiovascular system: S1 & S2 heard, RRR. No JVD, murmurs, rubs, gallops or clicks. No pedal edema. Gastrointestinal system:  Abdomen is nondistended, soft and nontender. No organomegaly or masses felt. Normal bowel sounds heard. Central nervous system: Alert and oriented. No focal neurological deficits. Extremities: Symmetric 5 x 5 power.  Left foot in surgical wraps, not removed.  Dressings appear C/D/I Skin: No rashes, lesions or ulcers Psychiatry: Judgement and insight appear normal. Mood & affect appropriate.     Data Reviewed: I have personally reviewed following labs and imaging studies  CBC: Recent Labs  Lab 11/07/20 1758 11/08/20 0343 11/08/20 1206 11/09/20 0316 11/11/20 0522 11/12/20 0531 11/13/20 0412  WBC 14.5*   < > 12.3* 11.7* 14.6* 17.7* 12.1*  NEUTROABS 12.9*  --   --   --  11.3* 15.8* 8.5*  HGB 10.1*   < > 7.9* 7.8* 8.2* 8.8* 7.4*  HCT 31.6*   < > 24.9* 24.8* 25.7* 27.2* 24.3*  MCV 83.6   < > 84.1 85.2 84.3 84.5 87.1  PLT 346   < > 250 244 279 330 320   < > =  values in this interval not displayed.   Basic Metabolic Panel: Recent Labs  Lab 11/08/20 1206 11/09/20 0316 11/11/20 0522 11/12/20 0531 11/13/20 0412  NA 130* 134* 133* 135 137  K 2.9* 3.2* 3.5 4.8 3.7  CL 96* 100 100 103 106  CO2 _0 GLUCOSE 167* 201* 184* 273* 279*  BUN _1 CREATININE 1.35* 1.24 1.19 1.16 1.17  CALCIUM 7.9* 7.6* 7.7* 8.3* 7.7*   GFR: Estimated Creatinine Clearance: 96.1 mL/min (by C-G formula based on SCr of 1.17 mg/dL). Liver Function Tests: Recent Labs  Lab 11/07/20 1758  AST 25  ALT 17  ALKPHOS 134*  BILITOT 1.1  PROT 8.5*  ALBUMIN 2.7*   No results for input(s): LIPASE, AMYLASE in the last 168 hours. No results for input(s): AMMONIA in the last 168 hours. Coagulation Profile: Recent Labs  Lab 11/07/20 1758  INR 1.2   Cardiac Enzymes: No results for input(s): CKTOTAL, CKMB, CKMBINDEX, TROPONINI in the last 168 hours. BNP (last 3 results) No results for input(s): PROBNP in the last 8760 hours. HbA1C: No results for input(s): HGBA1C in the last 72  hours. CBG: Recent Labs  Lab 11/12/20 0808 11/12/20 1253 11/12/20 1645 11/12/20 2113 11/13/20 0757  GLUCAP 257* 251* 232* 254* 271*   Lipid Profile: No results for input(s): CHOL, HDL, LDLCALC, TRIG, CHOLHDL, LDLDIRECT in the last 72 hours. Thyroid Function Tests: No results for input(s): TSH, T4TOTAL, FREET4, T3FREE, THYROIDAB in the last 72 hours. Anemia Panel: No results for input(s): VITAMINB12, FOLATE, FERRITIN, TIBC, IRON, RETICCTPCT in the last 72 hours. Sepsis Labs: Recent Labs  Lab 11/07/20 1758 11/07/20 2343  LATICACIDVEN 1.8 2.8*    Recent Results (from the past 240 hour(s))  Urine culture     Status: None   Collection Time: 11/07/20  5:49 PM   Specimen: In/Out Cath Urine  Result Value Ref Range Status   Specimen Description   Final    IN/OUT CATH URINE Performed at [facility], [address], [place], [place] [address]    Special Requests   Final    NONE Performed at [facility], [address] [place], [place] [address]    Culture   Final    NO GROWTH Performed at [facility], [place] [address], [place], [place] [address]    Report Status 11/08/2020 FINAL  Final  Blood Culture (routine x 2)     Status: Abnormal   Collection Time: 11/07/20  5:58 PM   Specimen: BLOOD  Result Value Ref Range Status   Specimen Description   Final    BLOOD RIGHT ANTECUBITAL Performed at [facility], [address] [place], [place] [address]    Special Requests   Final    BOTTLES DRAWN AEROBIC AND ANAEROBIC Blood Culture results may not be optimal due to an excessive volume of blood received in culture bottles Performed at [facility], [address] [place], [place] [address]    Culture  Setup Time   Final    GRAM POSITIVE COCCI IN BOTH AEROBIC AND ANAEROBIC BOTTLES CRITICAL VALUE NOTED.  VALUE IS CONSISTENT WITH PREVIOUSLY REPORTED AND CALLED VALUE.    Culture (A)  Final    GROUP B  STREP(S.AGALACTIAE)ISOLATED SUSCEPTIBILITIES PERFORMED ON PREVIOUS CULTURE WITHIN THE LAST 5 DAYS. Performed at [facility], [place] [address] [place], [place] [address]    Report Status 11/11/2020 FINAL  Final  Blood Culture (routine x  2)     Status: Abnormal   Collection Time: 11/07/20  5:58 PM   Specimen: BLOOD  Result Value Ref Range Status   Specimen Description   Final    BLOOD BLOOD LEFT FOREARM Performed at [facility], [address], [place], [place] [address]    Special Requests   Final    BOTTLES DRAWN AEROBIC AND ANAEROBIC Blood Culture adequate volume Performed at [facility], [address] [place], [place] [address]    Culture  Setup Time   Final    Organism ID to follow GRAM POSITIVE COCCI IN BOTH AEROBIC AND ANAEROBIC BOTTLES CRITICAL RESULT CALLED TO, READ BACK BY AND VERIFIED WITH: SUSAN WATSON AT 5176 12/21/821 [place] Performed at [facility], [address] [place], [place] [address]    Culture GROUP B STREP(S.AGALACTIAE)ISOLATED (A)  Final   Report Status 11/10/2020 FINAL  Final   Organism ID, Bacteria GROUP B STREP(S.AGALACTIAE)ISOLATED  Final      Susceptibility   Group b strep(s.agalactiae)isolated - MIC*    CLINDAMYCIN >=1 RESISTANT Resistant     AMPICILLIN <=0.25 SENSITIVE Sensitive     ERYTHROMYCIN 2 RESISTANT Resistant     VANCOMYCIN 0.5 SENSITIVE Sensitive     CEFTRIAXONE <=0.12 SENSITIVE Sensitive     LEVOFLOXACIN 1 SENSITIVE Sensitive     PENICILLIN Value in next row Sensitive      SENSITIVE<=0.06    * GROUP B STREP(S.AGALACTIAE)ISOLATED  Resp Panel by RT-PCR (Flu A&B, Covid) Nasopharyngeal Swab     Status: None   Collection Time: 11/07/20  5:58 PM   Specimen: Nasopharyngeal Swab; Nasopharyngeal(NP) swabs in vial transport medium  Result Value Ref Range Status   SARS Coronavirus 2 by RT PCR NEGATIVE NEGATIVE Final    Comment: (NOTE) SARS-CoV-2 target nucleic acids are NOT DETECTED.  The SARS-CoV-2 RNA  is generally detectable in upper respiratory specimens during the acute phase of infection. The lowest concentration of SARS-CoV-2 viral copies this assay can detect is 138 copies/mL. A negative result does not preclude SARS-Cov-2 infection and should not be used as the sole basis for treatment or other patient management decisions. A negative result may occur with  improper specimen collection/handling, submission of specimen other than nasopharyngeal swab, presence of viral mutation(s) within the areas targeted by this assay, and inadequate number of viral copies(<138 copies/mL). A negative result must be combined with clinical observations, patient history, and epidemiological information. The expected result is Negative.  Fact Sheet for Patients:  EntrepreneurPulse.com.au  Fact Sheet for Healthcare Providers:  IncredibleEmployment.be  This test is no t yet approved or cleared by the [place] FDA and  has been authorized for detection and/or diagnosis of SARS-CoV-2 by FDA under an Emergency Use Authorization (EUA). This EUA will remain  in effect (meaning this test can be used) for the duration of the COVID-19 declaration under Section 564(b)(1) of the Act, 21 U.S.C.section 360bbb-3(b)(1), unless the authorization is terminated  or revoked sooner.       Influenza A by PCR NEGATIVE NEGATIVE Final   Influenza B by PCR NEGATIVE NEGATIVE Final    Comment: (NOTE) The Xpert Xpress SARS-CoV-2/FLU/RSV plus assay is intended as an aid in the diagnosis of influenza from Nasopharyngeal swab specimens and should not be used as a sole basis for treatment. Nasal washings and aspirates are unacceptable for Xpert Xpress SARS-CoV-2/FLU/RSV testing.  Fact Sheet for Patients: EntrepreneurPulse.com.au  Fact Sheet for Healthcare Providers: IncredibleEmployment.be  This test is not yet approved or cleared  by the  [place] and has been authorized for detection and/or diagnosis of SARS-CoV-2 by FDA under an Emergency Use Authorization (EUA). This EUA will remain in effect (meaning this test can be used) for the duration of the COVID-19 declaration under Section 564(b)(1) of the Act, 21 U.S.C. section 360bbb-3(b)(1), unless the authorization is terminated or revoked.  Performed at [facility], [address] [place], [place] [address]   Blood Culture ID Panel (Reflexed)     Status: Abnormal   Collection Time: 11/07/20  5:58 PM  Result Value Ref Range Status   Enterococcus faecalis NOT DETECTED NOT DETECTED Final   Enterococcus Faecium NOT DETECTED NOT DETECTED Final   Listeria monocytogenes NOT DETECTED NOT DETECTED Final   Staphylococcus species NOT DETECTED NOT DETECTED Final   Staphylococcus aureus (BCID) NOT DETECTED NOT DETECTED Final   Staphylococcus epidermidis NOT DETECTED NOT DETECTED Final   Staphylococcus lugdunensis NOT DETECTED NOT DETECTED Final   Streptococcus species DETECTED (A) NOT DETECTED Final    Comment: CRITICAL RESULT CALLED TO, READ BACK BY AND VERIFIED WITH:  SUSAN WATSON AT 1307 11/08/20 SDR    Streptococcus agalactiae DETECTED (A) NOT DETECTED Final    Comment: CRITICAL RESULT CALLED TO, READ BACK BY AND VERIFIED WITH:  SUSAN WATSON AT 1307 11/08/20 SDR    Streptococcus pneumoniae NOT DETECTED NOT DETECTED Final   Streptococcus pyogenes NOT DETECTED NOT DETECTED Final   A.calcoaceticus-baumannii NOT DETECTED NOT DETECTED Final   Bacteroides fragilis NOT DETECTED NOT DETECTED Final   Enterobacterales NOT DETECTED NOT DETECTED Final   Enterobacter cloacae complex NOT DETECTED NOT DETECTED Final   Escherichia coli NOT DETECTED NOT DETECTED Final   Klebsiella aerogenes NOT DETECTED NOT DETECTED Final   Klebsiella oxytoca NOT DETECTED NOT DETECTED Final   Klebsiella pneumoniae NOT DETECTED NOT DETECTED Final   Proteus species NOT DETECTED NOT  DETECTED Final   Salmonella species NOT DETECTED NOT DETECTED Final   Serratia marcescens NOT DETECTED NOT DETECTED Final   Haemophilus influenzae NOT DETECTED NOT DETECTED Final   Neisseria meningitidis NOT DETECTED NOT DETECTED Final   Pseudomonas aeruginosa NOT DETECTED NOT DETECTED Final   Stenotrophomonas maltophilia NOT DETECTED NOT DETECTED Final   Candida albicans NOT DETECTED NOT DETECTED Final   Candida auris NOT DETECTED NOT DETECTED Final   Candida glabrata NOT DETECTED NOT DETECTED Final   Candida krusei NOT DETECTED NOT DETECTED Final   Candida parapsilosis NOT DETECTED NOT DETECTED Final   Candida tropicalis NOT DETECTED NOT DETECTED Final   Cryptococcus neoformans/gattii NOT DETECTED NOT DETECTED Final    Comment: Performed at [facility], [address] [place], [place] [address]  MRSA PCR Screening     Status: None   Collection Time: 11/07/20  8:03 PM   Specimen: Nasopharyngeal  Result Value Ref Range Status   MRSA by PCR NEGATIVE NEGATIVE Final    Comment:        The GeneXpert MRSA Assay (FDA approved for NASAL specimens only), is one component of a comprehensive MRSA colonization surveillance program. It is not intended to diagnose MRSA infection nor to guide or monitor treatment for MRSA infections. Performed at [facility], [address] [place], [place] [address]   Anaerobic culture     Status: Abnormal   Collection Time: 11/07/20  8:13 PM   Specimen: PATH Other  Result Value Ref Range Status   Specimen Description   Final    TOE LEFT Performed at [facility],  [place], [place] [address]    Special Requests BONE  Final   Gram Stain   Final    RARE WBC PRESENT,BOTH PMN AND MONONUCLEAR MODERATE GRAM POSITIVE COCCI IN PAIRS IN CLUSTERS    Culture (A)  Final    PREVOTELLA DISIENS BETA LACTAMASE POSITIVE Performed at [facility], [place] [address] [place], [place] [address]    Report Status  11/12/2020 FINAL  Final  Aerobic/Anaerobic Culture (surgical/deep wound)     Status: None   Collection Time: 11/07/20  8:13 PM   Specimen: PATH Other; Abscess  Result Value Ref Range Status   Specimen Description   Final    ABSCESS Performed at [facility], [address] [place], [place] [address]    Special Requests   Final    LEFT FOOT Performed at [facility], [place], [place] [address]    Gram Stain   Final    RARE WBC PRESENT,BOTH PMN AND MONONUCLEAR ABUNDANT GRAM POSITIVE COCCI IN PAIRS IN CLUSTERS    Culture   Final    FEW GROUP B STREP(S.AGALACTIAE)ISOLATED TESTING AGAINST S. AGALACTIAE NOT ROUTINELY PERFORMED DUE TO PREDICTABILITY OF AMP/PEN/VAN SUSCEPTIBILITY. FEW PREVOTELLA DISIENS BETA LACTAMASE POSITIVE Performed at [facility], [place] [address], [place], [place] [address]    Report Status 11/12/2020 FINAL  Final  Aerobic Culture (superficial specimen)     Status: None   Collection Time: 11/07/20  8:13 PM   Specimen: PATH Other  Result Value Ref Range Status   Specimen Description   Final    ABSCESS Performed at [facility], [address] [place], [place] [address]    Special Requests LEFT FOOT BONE  Final   Gram Stain   Final    NO WBC SEEN FEW GRAM POSITIVE COCCI IN PAIRS IN CLUSTERS    Culture   Final    MODERATE GROUP B STREP(S.AGALACTIAE)ISOLATED TESTING AGAINST S. AGALACTIAE NOT ROUTINELY PERFORMED DUE TO PREDICTABILITY OF AMP/PEN/VAN SUSCEPTIBILITY. Performed at [facility], [place] [address] [place], [place] [address]    Report Status 11/10/2020 FINAL  Final  CULTURE, BLOOD (ROUTINE X 2) w Reflex to ID Panel     Status: None (Preliminary result)   Collection Time: 11/11/20 12:48 PM   Specimen: BLOOD  Result Value Ref Range Status   Specimen Description BLOOD BLOOD RIGHT HAND  Final   Special Requests   Final    BOTTLES DRAWN AEROBIC AND ANAEROBIC Blood Culture adequate volume    Culture   Final    NO GROWTH 2 DAYS Performed at [facility], [address] [place], [place] [address]    Report Status PENDING  Incomplete  CULTURE, BLOOD (ROUTINE X 2) w Reflex to ID Panel     Status: None (Preliminary result)   Collection Time: 11/11/20  1:06 PM   Specimen: BLOOD  Result Value Ref Range Status   Specimen Description BLOOD LEFT ANTECUBITAL  Final   Special Requests   Final    BOTTLES DRAWN AEROBIC AND ANAEROBIC Blood Culture adequate volume   Culture   Final    NO GROWTH 2 DAYS Performed at [facility], [address] [place], [place] [address]    Report Status PENDING  Incomplete         Radiology Studies: No results found.      Scheduled Meds: . acetaminophen  650 mg Oral Q6H  . atorvastatin  40 mg Oral  Daily  . busPIRone  15 mg Oral BID  . Chlorhexidine Gluconate Cloth  6 each Topical Daily  . dicyclomine  10 mg Oral TID AC & HS  . enoxaparin (LOVENOX) injection  50 mg Subcutaneous Daily  . famotidine  20 mg Oral BID  . ferrous sulfate  325 mg Oral BID WC  . gabapentin  600 mg Oral QHS  . insulin aspart  0-20 Units Subcutaneous TID AC & HS  . insulin aspart  5 Units Subcutaneous TID WC  . insulin glargine  75 Units Subcutaneous Daily  . lidocaine  1 patch Transdermal Q24H  . methocarbamol  750 mg Oral QID  . metoCLOPramide  10 mg Oral TID AC  . multivitamin with minerals  1 tablet Oral Daily  . pantoprazole  40 mg Oral Daily  . sertraline  100 mg Oral Daily  . vitamin B-12  500 mcg Oral Daily   Continuous Infusions: . sodium chloride Stopped (11/13/20 0515)  . ampicillin-sulbactam (UNASYN) IV 3 g (11/13/20 1100)  . lactated ringers    . lactated ringers 75 mL/hr at 11/13/20 0808     LOS: 6 days    Time spent: 15 minutes    Sidney Ace, MD [facility] Pager [phone]  If 7PM-7AM, please contact night-coverage 11/13/2020, 11:29 AM

## 2020-11-14 DIAGNOSIS — E1065 Type 1 diabetes mellitus with hyperglycemia: Secondary | ICD-10-CM

## 2020-11-14 DIAGNOSIS — E1142 Type 2 diabetes mellitus with diabetic polyneuropathy: Secondary | ICD-10-CM

## 2020-11-14 DIAGNOSIS — M726 Necrotizing fasciitis: Secondary | ICD-10-CM | POA: Diagnosis not present

## 2020-11-14 DIAGNOSIS — M8610 Other acute osteomyelitis, unspecified site: Secondary | ICD-10-CM

## 2020-11-14 DIAGNOSIS — R7881 Bacteremia: Secondary | ICD-10-CM | POA: Diagnosis not present

## 2020-11-14 DIAGNOSIS — R739 Hyperglycemia, unspecified: Secondary | ICD-10-CM | POA: Diagnosis not present

## 2020-11-14 LAB — BASIC METABOLIC PANEL
Anion gap: 5 (ref 5–15)
BUN: 10 mg/dL (ref 6–20)
CO2: 25 mmol/L (ref 22–32)
Calcium: 8 mg/dL — ABNORMAL LOW (ref 8.9–10.3)
Chloride: 104 mmol/L (ref 98–111)
Creatinine, Ser: 1.01 mg/dL (ref 0.61–1.24)
GFR, Estimated: >60 mL/min (ref >60)
Glucose, Bld: 178 mg/dL — ABNORMAL HIGH (ref 70–99)
Potassium: 4 mmol/L (ref 3.5–5.1)
Sodium: 134 mmol/L — ABNORMAL LOW (ref 135–145)

## 2020-11-14 LAB — CBC
HCT: 25.2 % — ABNORMAL LOW (ref 39.0–52.0)
Hemoglobin: 7.7 g/dL — ABNORMAL LOW (ref 13.0–17.0)
MCH: 26.7 pg (ref 26.0–34.0)
MCHC: 30.6 g/dL (ref 30.0–36.0)
MCV: 87.5 fL (ref 80.0–100.0)
Platelets: 346 10*3/uL (ref 150–400)
RBC: 2.88 MIL/uL — ABNORMAL LOW (ref 4.22–5.81)
RDW: 15.6 % — ABNORMAL HIGH (ref 11.5–15.5)
WBC: 15.3 10*3/uL — ABNORMAL HIGH (ref 4.0–10.5)
nRBC: 0 % (ref 0.0–0.2)

## 2020-11-14 LAB — GLUCOSE, CAPILLARY
Glucose-Capillary: 182 mg/dL — ABNORMAL HIGH (ref 70–99)
Glucose-Capillary: 185 mg/dL — ABNORMAL HIGH (ref 70–99)
Glucose-Capillary: 255 mg/dL — ABNORMAL HIGH (ref 70–99)
Glucose-Capillary: 247 mg/dL — ABNORMAL HIGH (ref 70–99)

## 2020-11-14 MED ORDER — SODIUM CHLORIDE 0.9 % IV SOLN: INTRAVENOUS | Status: DC

## 2020-11-14 MED ORDER — SUMATRIPTAN SUCCINATE 50 MG PO TABS
25.0000 mg | ORAL_TABLET | ORAL | Status: DC | PRN
  Administered 2020-11-14 – 2020-11-15 (×3): 25 mg via ORAL
  Filled 2020-11-14 (×7): qty 1

## 2020-11-14 MED ORDER — HYDRALAZINE HCL 20 MG/ML IJ SOLN
10.0000 mg | INTRAMUSCULAR | Status: DC | PRN
  Administered 2020-11-14 – 2020-11-19 (×3): 10 mg via INTRAVENOUS
  Filled 2020-11-14 (×3): qty 1

## 2020-11-14 NOTE — Progress Notes (Signed)
3 Days Post-Op   Subjective/Chief Complaint: Patient seen.  Still having significant pain in the left foot.  States it was bad overnight.   Objective: Vital signs in last 24 hours: Temp:  [97.7 F (36.5 C)-98.8 F (37.1 C)] 97.7 F (36.5 C) (12/27 1206) Pulse Rate:  [73-83] 80 (12/27 1206) Resp:  [18] 18 (12/27 1206) BP: (156-196)/(77-94) 164/94 (12/27 1206) SpO2:  [94 %-100 %] 98 % (12/27 1206) Weight:  [106.7 kg] 106.7 kg (12/27 0425) Last BM Date: 11/14/20  Intake/Output from previous day: 12/26 0701 - 12/27 0700 In: 1998.7 [P.O.:360; I.V.:1338.7; IV Piggyback:300] Out: 1855 [Urine:1855] Intake/Output this shift: Total I/O In: 120 [P.O.:120] Out: 840 [Urine:840]  Heavy drainage still noted on the bandaging.  Upon removal there is some progressive increase in purulence on the bandaging.  White count still elevated and trending upward.  Lab Results:  Recent Labs    11/13/20 0412 11/14/20 0419  WBC 12.1* 15.3*  HGB 7.4* 7.7*  HCT 24.3* 25.2*  PLT 320 346   BMET Recent Labs    11/13/20 0412 11/14/20 0419  NA 137 134*  K 3.7 4.0  CL 106 104  CO2 25 25  GLUCOSE 279* 178*  BUN 18 10  CREATININE 1.17 1.01  CALCIUM 7.7* 8.0*   PT/INR No results for input(s): LABPROT, INR in the last 72 hours. ABG No results for input(s): PHART, HCO3 in the last 72 hours.  Invalid input(s): PCO2, PO2  Studies/Results: No results found.  Anti-infectives: Anti-infectives (From admission, onward)   Start     Dose/Rate Route Frequency Ordered Stop   11/11/20 2222  vancomycin (VANCOCIN) powder  Status:  Discontinued          As needed 11/11/20 2222 11/11/20 2310   11/11/20 1600  Ampicillin-Sulbactam (UNASYN) 3 g in sodium chloride 0.9 % 100 mL IVPB        3 g 200 mL/hr over 30 Minutes Intravenous Every 6 hours 11/11/20 1508     11/11/20 0800  cefTRIAXone (ROCEPHIN) 2 g in sodium chloride 0.9 % 100 mL IVPB  Status:  Discontinued        2 g 200 mL/hr over 30 Minutes  Intravenous Every 24 hours 11/11/20 0122 11/11/20 1506   11/09/20 0600  ceFEPIme (MAXIPIME) 2 g in sodium chloride 0.9 % 100 mL IVPB  Status:  Discontinued        2 g 200 mL/hr over 30 Minutes Intravenous Every 8 hours 11/09/20 0009 11/11/20 0122   11/09/20 0000  metroNIDAZOLE (FLAGYL) IVPB 500 mg  Status:  Discontinued        500 mg 100 mL/hr over 60 Minutes Intravenous Every 8 hours 11/08/20 1954 11/11/20 1507   11/08/20 2200  ceFEPIme (MAXIPIME) 2 g in sodium chloride 0.9 % 100 mL IVPB  Status:  Discontinued        2 g 200 mL/hr over 30 Minutes Intravenous Every 12 hours 11/08/20 1952 11/09/20 0009   11/08/20 1800  vancomycin (VANCOREADY) IVPB 1250 mg/250 mL  Status:  Discontinued        1,250 mg 166.7 mL/hr over 90 Minutes Intravenous Every 24 hours 11/07/20 1939 11/08/20 1258   11/08/20 1800  Ampicillin-Sulbactam (UNASYN) 3 g in sodium chloride 0.9 % 100 mL IVPB  Status:  Discontinued        3 g 200 mL/hr over 30 Minutes Intravenous Every 6 hours 11/08/20 1636 11/08/20 1951   11/08/20 1400  vancomycin (VANCOREADY) IVPB 750 mg/150 mL  Status:  Discontinued  750 mg 150 mL/hr over 60 Minutes Intravenous Every 12 hours 11/08/20 1258 11/10/20 1030   11/08/20 0800  piperacillin-tazobactam (ZOSYN) IVPB 3.375 g  Status:  Discontinued        3.375 g 12.5 mL/hr over 240 Minutes Intravenous Every 8 hours 11/07/20 1939 11/08/20 1636   11/07/20 2200  clindamycin (CLEOCIN) IVPB 600 mg  Status:  Discontinued        600 mg 100 mL/hr over 30 Minutes Intravenous Every 8 hours 11/07/20 1924 11/08/20 0834   11/07/20 1930  vancomycin (VANCOCIN) IVPB 1000 mg/200 mL premix  Status:  Discontinued        1,000 mg 200 mL/hr over 60 Minutes Intravenous  Once 11/07/20 1924 11/07/20 2244   11/07/20 1930  piperacillin-tazobactam (ZOSYN) IVPB 3.375 g  Status:  Discontinued        3.375 g 100 mL/hr over 30 Minutes Intravenous  Once 11/07/20 1924 11/08/20 1636   11/07/20 1815  vancomycin (VANCOREADY) IVPB  2000 mg/400 mL  Status:  Discontinued        2,000 mg 200 mL/hr over 120 Minutes Intravenous STAT 11/07/20 1801 11/07/20 1802   11/07/20 1815  vancomycin (VANCOCIN) IVPB 1000 mg/200 mL premix  Status:  Discontinued        1,000 mg 200 mL/hr over 60 Minutes Intravenous STAT 11/07/20 1802 11/08/20 1258   11/07/20 1800  ceFEPIme (MAXIPIME) 2 g in sodium chloride 0.9 % 100 mL IVPB        2 g 200 mL/hr over 30 Minutes Intravenous STAT 11/07/20 1757 11/07/20 2303      Assessment/Plan: s/p Procedure(s): IRRIGATION AND DEBRIDEMENT FOOT (Left) Assessment: 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   Plan: Dressing reapplied to the left foot.  Discussed with the patient and his wife that I did talk with Dr. Wyn Quaker this morning.  Plan at this point will be most likely for below-knee amputation on Wednesday.  Podiatry will sign off at this point.  LOS: 7 days    Ricci Barker 11/14/2020

## 2020-11-14 NOTE — Progress Notes (Signed)
   Date of Admission:  11/07/2020          Subjective: Rough day Some loose stools Tired Headache No fever or chills  Medications:  . acetaminophen  650 mg Oral Q6H  . atorvastatin  40 mg Oral Daily  . busPIRone  15 mg Oral BID  . Chlorhexidine Gluconate Cloth  6 each Topical Daily  . dicyclomine  10 mg Oral TID AC & HS  . enoxaparin (LOVENOX) injection  50 mg Subcutaneous Daily  . famotidine  20 mg Oral BID  . ferrous sulfate  325 mg Oral BID WC  . gabapentin  600 mg Oral QHS  . insulin aspart  0-20 Units Subcutaneous TID AC & HS  . insulin aspart  5 Units Subcutaneous TID WC  . insulin glargine  75 Units Subcutaneous Daily  . lidocaine  1 patch Transdermal Q24H  . methocarbamol  750 mg Oral QID  . metoCLOPramide  10 mg Oral TID AC  . multivitamin with minerals  1 tablet Oral Daily  . pantoprazole  40 mg Oral Daily  . sertraline  100 mg Oral Daily  . vitamin B-12  500 mcg Oral Daily    Objective: Vital signs in last 24 hours: Temp:  [98.2 F (36.8 C)-98.8 F (37.1 C)] 98.2 F (36.8 C) (12/27 0815) Pulse Rate:  [70-83] 77 (12/27 0815) Resp:  [18-19] 18 (12/27 0815) BP: (127-196)/(77-89) 178/86 (12/27 0815) SpO2:  [94 %-100 %] 96 % (12/27 0815) Weight:  [106.7 kg] 106.7 kg (12/27 0425)  PHYSICAL EXAM:  General: Alert, cooperative,  Head: Normocephalic, without obvious abnormality, atraumatic. Eyes: Conjunctivae clear, anicteric sclerae. Pupils are equal ENT Nares normal. No drainage or sinus tenderness. Lips, mucosa, and tongue normal. No Thrush Neck: Supple, symmetrical, no adenopathy, thyroid: non tender no carotid bruit and no JVD. Back: No CVA tenderness. Lungs: Clear to auscultation bilaterally. No Wheezing or Rhonchi. No rales. Heart: Regular rate and rhythm, no murmur, rub or gallop. Abdomen: Soft, non-tender,not distended. Bowel sounds normal. No masses Extremities:      Skin: No rashes or lesions. Or bruising Lymph: Cervical, supraclavicular  normal. Neurologic: Grossly non-focal  Lab Results Recent Labs    11/13/20 0412 11/14/20 0419  WBC 12.1* 15.3*  HGB 7.4* 7.7*  HCT 24.3* 25.2*  NA 137 134*  K 3.7 4.0  CL 106 104  CO2 25 25  BUN 18 10  CREATININE 1.17 1.01   Microbiology: Micro 11/07/20   4/4 group B streptococcus bacteremia 11/07/20 Wound culture- GBS + prevotella ( betalactamase positive)    Assessment/Plan: Diabetic foot infection with necrotizing infection of the foot and toes, abscess foot gangrene 3rd toe- s/p amputation Acute osteomyelitis of the bone with resected bone margin positive for osteo Culture GBS and anerobes He underwent further debridement on 12/24 in order to salvage his foot But worsening infection/necrosis so BKA is being planned for wednesday continue unasyn   Group B streptococcus bacteremia On unasyn  Diabetes mellitus poorly controlled HBa1c 11 Presented with hyperosmolar hyperglycemia  CKD  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 After many months of repeated infections in 2019 and 2020 needing multiple courses of antibiotics including vancomycin and then daptomycin the foot has healed completely.   Discussed the management with patient and his wife and care team

## 2020-11-14 NOTE — H&P (View-Only) (Signed)
[facility]  Daily Progress Note   Subjective  -   Patient with some pain in his foot and lower leg but no major events or problems overnight.  Had debridement and drainage of abscess by podiatry but the foot is not salvageable at this point.  Objective Vitals:   11/13/20 1949 11/14/20 0425 11/14/20 0815 11/14/20 1206  BP: (!) 156/82 (!) 196/77 (!) 178/86 (!) 164/94  Pulse: 76 83 77 80  Resp: 18 18 18 18   Temp: 98.4 F (36.9 C) 98.4 F (36.9 C) 98.2 F (36.8 C) 97.7 F (36.5 C)  TempSrc:  Oral Oral   SpO2: 100% 94% 96% 98%  Weight:  106.7 kg    Height:        Intake/Output Summary (Last 24 hours) at 11/14/2020 1516 Last data filed at 11/14/2020 1350 Gross per 24 hour  Intake 2238.66 ml  Output 2470 ml  Net -231.34 ml    PULM  CTAB CV  RRR VASC  left foot is currently dressed, lower leg is warm and has large amounts of hair.  Good capillary refill.  Laboratory CBC    Component Value Date/Time   WBC 15.3 (H) 11/14/2020 0419   HGB 7.7 (L) 11/14/2020 0419   HCT 25.2 (L) 11/14/2020 0419   PLT 346 11/14/2020 0419    BMET    Component Value Date/Time   NA 134 (L) 11/14/2020 0419   K 4.0 11/14/2020 0419   CL 104 11/14/2020 0419   CO2 25 11/14/2020 0419   GLUCOSE 178 (H) 11/14/2020 0419   BUN 10 11/14/2020 0419   CREATININE 1.01 11/14/2020 0419   CALCIUM 8.0 (L) 11/14/2020 0419   GFRNONAA >60 11/14/2020 0419   GFRAA >60 05/05/2020 1930    Assessment/Planning: POD #3 s/p left foot debridement and drainage of abscess by podiatry.  White count is still elevated and there was excessive purulence even at the last debridement   At this point, after discussions with podiatry and the patient the foot is not salvageable and it is recommended he undergo a below-knee amputation.  I had a discussion with the patient today and will contact his wife as well.  Left below-knee amputation will be planned for Wednesday.  Risks and benefits were  discussed and he is agreeable to proceed.    Sunday  11/14/2020, 3:16 PM

## 2020-11-14 NOTE — Progress Notes (Signed)
PROGRESS NOTE    Richard Mendoza  LXB:[id number] DOB: Oct 28, 1976 DOA: 11/07/2020 PCP: Tracie Harrier, MD   Brief Narrative:   44 y.o. 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  His blood glucose levels have been significantly elevated.  He admitted to fever and chills at home.  He has been having nausea without vomiting or diarrhea or abdominal pain.  He denied any dyspnea or cough or wheezing.  He admits to polyuria and polydipsia.  He was given doxycycline by Dr. Vickki Muff and has been on it.  His left foot pain has been worsening with associated erythema, swelling and radiation to his left leg.  No chest pain or dyspnea or palpitations.  No dysuria, oliguria or hematuria or flank pain.  Upon presentation to the emergency room, temperature was 101.2 heart rate of 104 and otherwise normal vital signs.  Labs revealed hyponatremia 121 with hypochloremia of 82, hypokalemia 3.4 and significant hyperglycemia blood glucose of 619 and CO2 of 25 with anion gap of 14.  Alk phos was 134 with albumin of 2.7 and troponin 8.5.  Lactic acid was 1.8 CBC showed leukocytosis of 14.5 with neutrophilia and anemia.  Influenza antigens and COVID-19 PCR came back negative.  Blood cultures were drawn.  Two-view chest x-ray showed diffuse subcutaneous gas along the plantar foot extending into the third toe with findings concerning for necrotizing fasciitis and no acute fracture or dislocation.  Dr. Caryl Comes was notified about the patient and evaluated him at bedside   12/22: Postop day #2.  Patient still with some significant pain.  Also endorsing back pain in addition to throbbing pain in the affected extremity. 12/23: Improved pain control  12/25: After discussion with podiatry and vascular surgery decision was made to  take patient back to the OR 11/11/2020 for repeat debridement in an attempt to salvage patient's foot and avoid a BKA.  Patient tolerated procedure well.  This morning patient still complaining of some throbbing pain of the left foot but otherwise stable.  12/27: Surgical dressings removed yesterday by podiatry.  Noted still heavy bleeding and increased purulence on the bandaging.  Unfortunately of left foot does not appear viable at this time.  Podiatry recommending BKA.  They will discuss with vascular surgery for timing.   Assessment & Plan:   Active Problems:   Necrotizing fasciitis of ankle and foot (HCC)  Left foot necrotizing fasciitis  with associated severe purulent left third toe cellulitisabscess.  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  Pain control improving over interval Group B strep noted on wound cultures No evidence of MRSA Podiatry saw patient 11/10/2020.  Unfortunately nonviable tissue in the foot.   -Initially plan was to proceed with BKA however after repeat discussions with podiatry and vascular surgery decision was made to return patient back to the OR on 11/11/2020 and attempt to salvage left foot and avoid BKA. -Podiatry evaluated wound 11/13/2020.  Foot likely nonviable.  Recommending BKA.  Patient aware.  Vascular surgery to follow-up. 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  CT L spine ordered to r/o infection.  None noted Plan: Multimodal pain control  Headache, migraine type Patient endorses pounding retro-orbital headache No history of migraines Plan: Imitrex 25 mg p.o. every 2 hours as needed Consider subcu Imitrex versus Fioricet if above is ineffective  Nonketotic hyperosmolar hyperglycemia:  Poorly controlled diabetes  mellitus  Last hemoglobin A1c 12 Weaned off of insulin drip.  Plan: Continue on lantus, aspart & SSI w/ accuchecks.  DM coordinator consulted.   Leukocytosis:, Resolved secondary to above infection. Continue on IV abxs    Chronic kidney disease stage IIIa:  Creatinine at baseline Monitor daily renal function Avoid nonessential nephrotoxins  Peripheral neuropathy  continue on home dose of gabapentin   Hyponatremia , resolved likely pseudohyponatremia from hyperglycemia  Hypokalemia  KCl repleted. 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  No need for a transfusion at this time.  DVT prophylaxis: SQ Lovenox Code Status: Full Family Communication: Wife at bedside 11/11/2020 Disposition Plan:Status is: Inpatient  Remains inpatient appropriate because:Inpatient level of care appropriate due to severity of illness   Dispo: The patient is from: Home              Anticipated d/c is to: Unknown at this time              Anticipated d/c date is: > 3 days              Patient currently is not medically stable to d/c.   Plan for BKA during this admission most likely.  Disposition plan pending  Consultants:   Podiatry  Infectious disease  Procedures:   Left foot I&D 11/07/2020  Repeat left foot I&D 11/11/2020  Antimicrobials:    Unasyn   Subjective: Patient seen and examined.  Endorsing headache.  Objective: Vitals:   11/13/20 1746 11/13/20 1949 11/14/20 0425 11/14/20 0815  BP: (!) 165/89 (!) 156/82 (!) 196/77 (!) 178/86  Pulse: 73 76 83 77  Resp: 18 18 18 18   Temp: 98.8 F (37.1 C) 98.4 F (36.9 C) 98.4 F (36.9 C) 98.2 F (36.8 C)  TempSrc:   Oral Oral  SpO2: 100% 100% 94% 96%  Weight:   106.7 kg   Height:        Intake/Output Summary (Last 24 hours) at 11/14/2020 1023 Last data filed at  11/14/2020 0945 Gross per 24 hour  Intake 2118.66 ml  Output 2470 ml  Net -351.34 ml   Filed Weights   11/11/20 0603 11/12/20 0406 11/14/20 0425  Weight: 105.1 kg 101.3 kg 106.7 kg    Examination:  General exam: Appears calm and comfortable  Respiratory system: Clear to auscultation. Respiratory effort normal. Cardiovascular system: S1 & S2 heard, RRR. No JVD, murmurs, rubs, gallops or clicks. No pedal edema. Gastrointestinal system: Abdomen is nondistended, soft and nontender. No organomegaly or masses felt. Normal bowel sounds heard. Central nervous system: Alert and oriented. No focal neurological deficits. Extremities: Symmetric 5 x 5 power.  Left foot in surgical wraps, not removed.  Dressings appear C/D/I Skin: No rashes, lesions or ulcers Psychiatry: Judgement and insight appear normal. Mood & affect appropriate.     Data Reviewed: I have personally reviewed following labs and imaging studies  CBC: Recent Labs  Lab 11/07/20 1758 11/08/20 0343 11/09/20 0316 11/11/20 0522 11/12/20 0531 11/13/20 0412 11/14/20 0419  WBC 14.5*   < > 11.7* 14.6* 17.7* 12.1* 15.3*  NEUTROABS 12.9*  --   --  11.3* 15.8* 8.5*  --   HGB 10.1*   < > 7.8* 8.2* 8.8* 7.4* 7.7*  HCT 31.6*   < > 24.8* 25.7* 27.2* 24.3* 25.2*  MCV 83.6   < > 85.2 84.3 84.5 87.1 87.5  PLT 346   < > 244 279 330 320 346   < > = values in this interval not displayed.   Basic Metabolic Panel: Recent Labs  Lab 11/09/20 0316 11/11/20 0522 11/12/20 0531 11/13/20 0412 11/14/20 0419  NA 134* 133* 135 137 134*  K 3.2* 3.5 4.8 3.7 4.0  CL 100 100 103 106 104  CO2 26 23 23 25 25   GLUCOSE 201* 184* 273* 279* 178*  BUN 12 12 14 18 10   CREATININE 1.24 1.19 1.16 1.17 1.01  CALCIUM 7.6* 7.7* 8.3* 7.7* 8.0*   GFR: Estimated Creatinine Clearance: 114.2 mL/min (by C-G formula based on SCr of 1.01 mg/dL). Liver Function Tests: Recent Labs  Lab 11/07/20 1758  AST 25  ALT 17  ALKPHOS 134*  BILITOT 1.1  PROT 8.5*   ALBUMIN 2.7*   No results for input(s): LIPASE, AMYLASE in the last 168 hours. No results for input(s): AMMONIA in the last 168 hours. Coagulation Profile: Recent Labs  Lab 11/07/20 1758  INR 1.2   Cardiac Enzymes: No results for input(s): CKTOTAL, CKMB, CKMBINDEX, TROPONINI in the last 168 hours. BNP (last 3 results) No results for input(s): PROBNP in the last 8760 hours. HbA1C: No results for input(s): HGBA1C in the last 72 hours. CBG: Recent Labs  Lab 11/13/20 0757 11/13/20 1144 11/13/20 1622 11/13/20 2047 11/14/20 0816  GLUCAP 271* 357* 228* 173* 182*   Lipid Profile: No results for input(s): CHOL, HDL, LDLCALC, TRIG, CHOLHDL, LDLDIRECT in the last 72 hours. Thyroid Function Tests: No results for input(s): TSH, T4TOTAL, FREET4, T3FREE, THYROIDAB in the last 72 hours. Anemia Panel: No results for input(s): VITAMINB12, FOLATE, FERRITIN, TIBC, IRON, RETICCTPCT in the last 72 hours. Sepsis Labs: Recent Labs  Lab 11/07/20 1758 11/07/20 2343  LATICACIDVEN 1.8 2.8*    Recent Results (from the past 240 hour(s))  Urine culture     Status: None   Collection Time: 11/07/20  5:49 PM   Specimen: In/Out Cath Urine  Result Value Ref Range Status   Specimen Description   Final    IN/OUT CATH URINE Performed at [facility], [address] [place], [place] [address]    Special Requests   Final    NONE Performed at [facility], [address], [place], [place] [address]    Culture   Final    NO GROWTH Performed at [facility], [place] [address] [place], [place] [address]    Report Status 11/08/2020 FINAL  Final  Blood Culture (routine x 2)     Status: Abnormal   Collection Time: 11/07/20  5:58 PM   Specimen: BLOOD  Result Value Ref Range Status   Specimen Description   Final    BLOOD RIGHT ANTECUBITAL Performed at [facility], [address] [place], [place] [address]    Special Requests   Final    BOTTLES DRAWN AEROBIC AND  ANAEROBIC Blood Culture results may not be optimal due to an excessive volume of blood received in culture bottles Performed at [facility], [address] [place], [place] [address]    Culture  Setup Time   Final    GRAM POSITIVE COCCI IN BOTH AEROBIC AND ANAEROBIC BOTTLES CRITICAL VALUE NOTED.  VALUE IS CONSISTENT WITH PREVIOUSLY REPORTED AND CALLED VALUE.    Culture (A)  Final    GROUP B STREP(S.AGALACTIAE)ISOLATED SUSCEPTIBILITIES PERFORMED ON PREVIOUS CULTURE WITHIN THE LAST 5 DAYS. Performed at [facility], [place] [address] [place], [place] [address]    Report Status 11/11/2020 FINAL  Final  Blood Culture (routine x 2)     Status: Abnormal   Collection Time: 11/07/20  5:58 PM   Specimen: BLOOD  Result Value Ref Range Status   Specimen Description   Final    BLOOD BLOOD LEFT FOREARM Performed at [facility], [address] [place], [place] [address]    Special Requests   Final    BOTTLES DRAWN AEROBIC AND ANAEROBIC Blood Culture adequate volume Performed at [facility], [address] [place], [place] [address]    Culture  Setup Time   Final    Organism ID to follow GRAM POSITIVE COCCI IN BOTH AEROBIC AND ANAEROBIC BOTTLES CRITICAL RESULT CALLED TO, READ BACK BY AND VERIFIED WITH: SUSAN WATSON AT 3664 12/21/821 [place] Performed at [facility], [address] [place],  [address]    Culture GROUP B STREP(S.AGALACTIAE)ISOLATED (A)  Final   Report Status 11/10/2020 FINAL  Final   Organism ID, Bacteria GROUP B STREP(S.AGALACTIAE)ISOLATED  Final      Susceptibility   Group b strep(s.agalactiae)isolated - MIC*    CLINDAMYCIN >=1 RESISTANT Resistant     AMPICILLIN <=0.25 SENSITIVE Sensitive     ERYTHROMYCIN 2 RESISTANT Resistant     VANCOMYCIN 0.5 SENSITIVE Sensitive     CEFTRIAXONE <=0.12 SENSITIVE Sensitive     LEVOFLOXACIN 1 SENSITIVE Sensitive     PENICILLIN Value in next row Sensitive      SENSITIVE<=0.06    *  GROUP B STREP(S.AGALACTIAE)ISOLATED  Resp Panel by RT-PCR (Flu A&B, Covid) Nasopharyngeal Swab     Status: None   Collection Time: 11/07/20  5:58 PM   Specimen: Nasopharyngeal Swab; Nasopharyngeal(NP) swabs in vial transport medium  Result Value Ref Range Status   SARS Coronavirus 2 by RT PCR NEGATIVE NEGATIVE Final    Comment: (NOTE) SARS-CoV-2 target nucleic acids are NOT DETECTED.  The SARS-CoV-2 RNA is generally detectable in upper respiratory specimens during the acute phase of infection. The lowest concentration of SARS-CoV-2 viral copies this assay can detect is 138 copies/mL. A negative result does not preclude SARS-Cov-2 infection and should not be used as the sole basis for treatment or other patient management decisions. A negative result may occur with  improper specimen collection/handling, submission of specimen other than nasopharyngeal swab, presence of viral mutation(s) within the areas targeted by this assay, and inadequate number of viral copies(<138 copies/mL). A negative result must be combined with clinical observations, patient history, and epidemiological information. The expected result is Negative.  Fact Sheet for Patients:  EntrepreneurPulse.com.au  Fact Sheet for Healthcare Providers:  IncredibleEmployment.be  This test is no t yet approved or cleared by the [place] FDA and  has been authorized for detection and/or diagnosis of SARS-CoV-2 by FDA under an Emergency Use Authorization (EUA). This EUA will remain  in effect (meaning this test can be used) for the duration of the COVID-19 declaration under Section 564(b)(1) of the Act, 21 U.S.C.section 360bbb-3(b)(1), unless the authorization is terminated  or revoked sooner.       Influenza A by PCR NEGATIVE NEGATIVE Final   Influenza B by PCR NEGATIVE NEGATIVE Final    Comment: (NOTE) The Xpert Xpress SARS-CoV-2/FLU/RSV plus  assay is intended as an aid in the  diagnosis of influenza from Nasopharyngeal swab specimens and should not be used as a sole basis for treatment. Nasal washings and aspirates are unacceptable for Xpert Xpress SARS-CoV-2/FLU/RSV testing.  Fact Sheet for Patients: EntrepreneurPulse.com.au  Fact Sheet for Healthcare Providers: IncredibleEmployment.be  This test is not yet approved or cleared by the [place] FDA and has been authorized for detection and/or diagnosis of SARS-CoV-2 by FDA under an Emergency Use Authorization (EUA). This EUA will remain in effect (meaning this test can be used) for the duration of the COVID-19 declaration under Section 564(b)(1) of the Act, 21 U.S.C. section 360bbb-3(b)(1), unless the authorization is terminated or revoked.  Performed at [facility], [address] [place], [place] [address]   Blood Culture ID Panel (Reflexed)     Status: Abnormal   Collection Time: 11/07/20  5:58 PM  Result Value Ref Range Status   Enterococcus faecalis NOT DETECTED NOT DETECTED Final   Enterococcus Faecium NOT DETECTED NOT DETECTED Final   Listeria monocytogenes NOT DETECTED NOT DETECTED Final   Staphylococcus species NOT DETECTED NOT DETECTED Final   Staphylococcus aureus (BCID) NOT DETECTED NOT DETECTED Final   Staphylococcus epidermidis NOT DETECTED NOT DETECTED Final   Staphylococcus lugdunensis NOT DETECTED NOT DETECTED Final   Streptococcus species DETECTED (A) NOT DETECTED Final    Comment: CRITICAL RESULT CALLED TO, READ BACK BY AND VERIFIED WITH:  SUSAN WATSON AT 1307 11/08/20 SDR    Streptococcus agalactiae DETECTED (A) NOT DETECTED Final    Comment: CRITICAL RESULT CALLED TO, READ BACK BY AND VERIFIED WITH:  SUSAN WATSON AT 1307 11/08/20 SDR    Streptococcus pneumoniae NOT DETECTED NOT DETECTED Final   Streptococcus pyogenes NOT DETECTED NOT DETECTED Final   A.calcoaceticus-baumannii NOT DETECTED NOT DETECTED Final   Bacteroides  fragilis NOT DETECTED NOT DETECTED Final   Enterobacterales NOT DETECTED NOT DETECTED Final   Enterobacter cloacae complex NOT DETECTED NOT DETECTED Final   Escherichia coli NOT DETECTED NOT DETECTED Final   Klebsiella aerogenes NOT DETECTED NOT DETECTED Final   Klebsiella oxytoca NOT DETECTED NOT DETECTED Final   Klebsiella pneumoniae NOT DETECTED NOT DETECTED Final   Proteus species NOT DETECTED NOT DETECTED Final   Salmonella species NOT DETECTED NOT DETECTED Final   Serratia marcescens NOT DETECTED NOT DETECTED Final   Haemophilus influenzae NOT DETECTED NOT DETECTED Final   Neisseria meningitidis NOT DETECTED NOT DETECTED Final   Pseudomonas aeruginosa NOT DETECTED NOT DETECTED Final   Stenotrophomonas maltophilia NOT DETECTED NOT DETECTED Final   Candida albicans NOT DETECTED NOT DETECTED Final   Candida auris NOT DETECTED NOT DETECTED Final   Candida glabrata NOT DETECTED NOT DETECTED Final   Candida krusei NOT DETECTED NOT DETECTED Final   Candida parapsilosis NOT DETECTED NOT DETECTED Final   Candida tropicalis NOT DETECTED NOT DETECTED Final   Cryptococcus neoformans/gattii NOT DETECTED NOT DETECTED Final    Comment: Performed at [facility], [address] [place], [place] [address]  MRSA PCR Screening     Status: None   Collection Time: 11/07/20  8:03 PM   Specimen: Nasopharyngeal  Result Value Ref Range Status   MRSA by PCR NEGATIVE NEGATIVE Final    Comment:        The GeneXpert MRSA Assay (FDA approved for NASAL specimens only), is one component of a comprehensive MRSA colonization surveillance program. It is not intended to diagnose MRSA infection nor to guide or monitor treatment for MRSA  infections. Performed at [facility], [address] [place], [place] [address]   Anaerobic culture     Status: Abnormal   Collection Time: 11/07/20  8:13 PM   Specimen: PATH Other  Result Value Ref Range Status   Specimen Description   Final     TOE LEFT Performed at [facility], [address] [place], [place] [address]    Special Requests BONE  Final   Gram Stain   Final    RARE WBC PRESENT,BOTH PMN AND MONONUCLEAR MODERATE GRAM POSITIVE COCCI IN PAIRS IN CLUSTERS    Culture (A)  Final    PREVOTELLA DISIENS BETA LACTAMASE POSITIVE Performed at [facility], [place] [address] [place], [place] [address]    Report Status 11/12/2020 FINAL  Final  Aerobic/Anaerobic Culture (surgical/deep wound)     Status: None   Collection Time: 11/07/20  8:13 PM   Specimen: PATH Other; Abscess  Result Value Ref Range Status   Specimen Description   Final    ABSCESS Performed at [facility], [address] [place], [place] [address]    Special Requests   Final    LEFT FOOT Performed at [facility], [place], [place] [address]    Gram Stain   Final    RARE WBC PRESENT,BOTH PMN AND MONONUCLEAR ABUNDANT GRAM POSITIVE COCCI IN PAIRS IN CLUSTERS    Culture   Final    FEW GROUP B STREP(S.AGALACTIAE)ISOLATED TESTING AGAINST S. AGALACTIAE NOT ROUTINELY PERFORMED DUE TO PREDICTABILITY OF AMP/PEN/VAN SUSCEPTIBILITY. FEW PREVOTELLA DISIENS BETA LACTAMASE POSITIVE Performed at [facility], [place] [address], [place], [place] [address]    Report Status 11/12/2020 FINAL  Final  Aerobic Culture (superficial specimen)     Status: None   Collection Time: 11/07/20  8:13 PM   Specimen: PATH Other  Result Value Ref Range Status   Specimen Description   Final    ABSCESS Performed at [facility], [address] [place], [place] [address]    Special Requests LEFT FOOT BONE  Final   Gram Stain   Final    NO WBC SEEN FEW GRAM POSITIVE COCCI IN PAIRS IN CLUSTERS    Culture   Final    MODERATE GROUP B STREP(S.AGALACTIAE)ISOLATED TESTING AGAINST S. AGALACTIAE NOT ROUTINELY PERFORMED DUE TO PREDICTABILITY OF AMP/PEN/VAN SUSCEPTIBILITY. Performed at [facility], [place]  [address] [place], [place] [address]    Report Status 11/10/2020 FINAL  Final  CULTURE, BLOOD (ROUTINE X 2) w Reflex to ID Panel     Status: None (Preliminary result)   Collection Time: 11/11/20 12:48 PM   Specimen: BLOOD  Result Value Ref Range Status   Specimen Description BLOOD BLOOD RIGHT HAND  Final   Special Requests   Final    BOTTLES DRAWN AEROBIC AND ANAEROBIC Blood Culture adequate volume   Culture   Final    NO GROWTH 3 DAYS Performed at [facility], [address] [place],  [address]    Report Status PENDING  Incomplete  CULTURE, BLOOD (ROUTINE X 2) w Reflex to ID Panel     Status: None (Preliminary result)   Collection Time: 11/11/20  1:06 PM   Specimen: BLOOD  Result Value Ref Range Status   Specimen Description BLOOD LEFT ANTECUBITAL  Final   Special Requests   Final    BOTTLES DRAWN AEROBIC AND ANAEROBIC Blood Culture adequate volume   Culture   Final  NO GROWTH 3 DAYS Performed at [facility], [address] [place], [place] [address]    Report Status PENDING  Incomplete         Radiology Studies: No results found.      Scheduled Meds: . acetaminophen  650 mg Oral Q6H  . atorvastatin  40 mg Oral Daily  . busPIRone  15 mg Oral BID  . Chlorhexidine Gluconate Cloth  6 each Topical Daily  . dicyclomine  10 mg Oral TID AC & HS  . enoxaparin (LOVENOX) injection  50 mg Subcutaneous Daily  . famotidine  20 mg Oral BID  . ferrous sulfate  325 mg Oral BID WC  . gabapentin  600 mg Oral QHS  . insulin aspart  0-20 Units Subcutaneous TID AC & HS  . insulin aspart  5 Units Subcutaneous TID WC  . insulin glargine  75 Units Subcutaneous Daily  . lidocaine  1 patch Transdermal Q24H  . methocarbamol  750 mg Oral QID  . metoCLOPramide  10 mg Oral TID AC  . multivitamin with minerals  1 tablet Oral Daily  . pantoprazole  40 mg Oral Daily  . sertraline  100 mg Oral Daily  . vitamin B-12  500 mcg Oral Daily   Continuous Infusions: .  sodium chloride Stopped (11/13/20 0515)  . ampicillin-sulbactam (UNASYN) IV 3 g (11/14/20 0829)  . lactated ringers 75 mL/hr at 11/13/20 2349     LOS: 7 days    Time spent: 25 minutes    Sidney Ace, MD [facility] Pager [phone]  If 7PM-7AM, please contact night-coverage 11/14/2020, 10:23 AM

## 2020-11-14 NOTE — Progress Notes (Signed)
[facility]  Daily Progress Note   Subjective  -   Patient with some pain in his foot and lower leg but no major events or problems overnight.  Had debridement and drainage of abscess by podiatry but the foot is not salvageable at this point.  Objective Vitals:   11/13/20 1949 11/14/20 0425 11/14/20 0815 11/14/20 1206  BP: (!) 156/82 (!) 196/77 (!) 178/86 (!) 164/94  Pulse: 76 83 77 80  Resp: 18 18 18 18  Temp: 98.4 F (36.9 C) 98.4 F (36.9 C) 98.2 F (36.8 C) 97.7 F (36.5 C)  TempSrc:  Oral Oral   SpO2: 100% 94% 96% 98%  Weight:  106.7 kg    Height:        Intake/Output Summary (Last 24 hours) at 11/14/2020 1516 Last data filed at 11/14/2020 1350 Gross per 24 hour  Intake 2238.66 ml  Output 2470 ml  Net -231.34 ml    PULM  CTAB CV  RRR VASC  left foot is currently dressed, lower leg is warm and has large amounts of hair.  Good capillary refill.  Laboratory CBC    Component Value Date/Time   WBC 15.3 (H) 11/14/2020 0419   HGB 7.7 (L) 11/14/2020 0419   HCT 25.2 (L) 11/14/2020 0419   PLT 346 11/14/2020 0419    BMET    Component Value Date/Time   NA 134 (L) 11/14/2020 0419   K 4.0 11/14/2020 0419   CL 104 11/14/2020 0419   CO2 25 11/14/2020 0419   GLUCOSE 178 (H) 11/14/2020 0419   BUN 10 11/14/2020 0419   CREATININE 1.01 11/14/2020 0419   CALCIUM 8.0 (L) 11/14/2020 0419   GFRNONAA >60 11/14/2020 0419   GFRAA >60 05/05/2020 1930    Assessment/Planning: POD #3 s/p left foot debridement and drainage of abscess by podiatry.  White count is still elevated and there was excessive purulence even at the last debridement   At this point, after discussions with podiatry and the patient the foot is not salvageable and it is recommended he undergo a below-knee amputation.  I had a discussion with the patient today and will contact his wife as well.  Left below-knee amputation will be planned for Wednesday.  Risks and benefits were  discussed and he is agreeable to proceed.    Mahlani Berninger  11/14/2020, 3:16 PM     

## 2020-11-15 DIAGNOSIS — Z89422 Acquired absence of other left toe(s): Secondary | ICD-10-CM

## 2020-11-15 DIAGNOSIS — B9689 Other specified bacterial agents as the cause of diseases classified elsewhere: Secondary | ICD-10-CM

## 2020-11-15 DIAGNOSIS — M86172 Other acute osteomyelitis, left ankle and foot: Secondary | ICD-10-CM

## 2020-11-15 DIAGNOSIS — M726 Necrotizing fasciitis: Secondary | ICD-10-CM | POA: Diagnosis not present

## 2020-11-15 DIAGNOSIS — D631 Anemia in chronic kidney disease: Secondary | ICD-10-CM

## 2020-11-15 LAB — BASIC METABOLIC PANEL
Anion gap: 7 (ref 5–15)
BUN: 10 mg/dL (ref 6–20)
CO2: 26 mmol/L (ref 22–32)
Calcium: 8.3 mg/dL — ABNORMAL LOW (ref 8.9–10.3)
Chloride: 102 mmol/L (ref 98–111)
Creatinine, Ser: 1.06 mg/dL (ref 0.61–1.24)
GFR, Estimated: >60 mL/min (ref >60)
Glucose, Bld: 242 mg/dL — ABNORMAL HIGH (ref 70–99)
Potassium: 4.1 mmol/L (ref 3.5–5.1)
Sodium: 135 mmol/L (ref 135–145)

## 2020-11-15 LAB — GLUCOSE, CAPILLARY
Glucose-Capillary: 207 mg/dL — ABNORMAL HIGH (ref 70–99)
Glucose-Capillary: 219 mg/dL — ABNORMAL HIGH (ref 70–99)
Glucose-Capillary: 265 mg/dL — ABNORMAL HIGH (ref 70–99)
Glucose-Capillary: 293 mg/dL — ABNORMAL HIGH (ref 70–99)

## 2020-11-15 LAB — CBC WITH DIFFERENTIAL/PLATELET
Abs Immature Granulocytes: 0.32 10*3/uL — ABNORMAL HIGH (ref 0.00–0.07)
Basophils Absolute: 0 10*3/uL (ref 0.0–0.1)
Basophils Relative: 0 %
Eosinophils Absolute: 0.2 10*3/uL (ref 0.0–0.5)
Eosinophils Relative: 1 %
HCT: 23.2 % — ABNORMAL LOW (ref 39.0–52.0)
Hemoglobin: 7 g/dL — ABNORMAL LOW (ref 13.0–17.0)
Immature Granulocytes: 2 %
Lymphocytes Relative: 16 %
Lymphs Abs: 2.2 10*3/uL (ref 0.7–4.0)
MCH: 26.4 pg (ref 26.0–34.0)
MCHC: 30.2 g/dL (ref 30.0–36.0)
MCV: 87.5 fL (ref 80.0–100.0)
Monocytes Absolute: 0.7 10*3/uL (ref 0.1–1.0)
Monocytes Relative: 5 %
Neutro Abs: 10.4 10*3/uL — ABNORMAL HIGH (ref 1.7–7.7)
Neutrophils Relative %: 76 %
Platelets: 382 10*3/uL (ref 150–400)
RBC: 2.65 MIL/uL — ABNORMAL LOW (ref 4.22–5.81)
RDW: 15.8 % — ABNORMAL HIGH (ref 11.5–15.5)
WBC: 13.9 10*3/uL — ABNORMAL HIGH (ref 4.0–10.5)
nRBC: 0 % (ref 0.0–0.2)

## 2020-11-15 LAB — PREPARE RBC (CROSSMATCH)

## 2020-11-15 MED ORDER — INSULIN ASPART 100 UNIT/ML ~~LOC~~ SOLN
7.0000 [IU] | Freq: Three times a day (TID) | SUBCUTANEOUS | Status: DC
  Administered 2020-11-15 – 2020-11-17 (×6): 7 [IU] via SUBCUTANEOUS
  Filled 2020-11-15 (×6): qty 1

## 2020-11-15 MED ORDER — ALPRAZOLAM 0.5 MG PO TABS
0.5000 mg | ORAL_TABLET | Freq: Three times a day (TID) | ORAL | Status: DC | PRN
  Administered 2020-11-15 – 2020-11-21 (×7): 0.5 mg via ORAL
  Filled 2020-11-15 (×7): qty 1

## 2020-11-15 MED ORDER — GABAPENTIN 600 MG PO TABS
300.0000 mg | ORAL_TABLET | Freq: Two times a day (BID) | ORAL | Status: DC
  Administered 2020-11-15 – 2020-11-21 (×11): 300 mg via ORAL
  Filled 2020-11-15 (×11): qty 1

## 2020-11-15 MED ORDER — INSULIN GLARGINE 100 UNIT/ML ~~LOC~~ SOLN
77.0000 [IU] | Freq: Every day | SUBCUTANEOUS | Status: DC
  Administered 2020-11-15: 77 [IU] via SUBCUTANEOUS
  Filled 2020-11-15 (×3): qty 0.77

## 2020-11-15 MED ORDER — SODIUM CHLORIDE 0.9% IV SOLUTION: Freq: Once | INTRAVENOUS | Status: DC

## 2020-11-15 NOTE — Progress Notes (Signed)
ID  Doing okay Waiting for BKA No diarrhea Not sleeping well at night Some headache  Wife at bedside  Patient Vitals for the past 24 hrs:  BP Temp Temp src Pulse Resp SpO2 Weight  11/15/20 1155 126/84 98.4 F (36.9 C) Oral 80 -- 100 % --  11/15/20 0816 (!) 178/97 97.9 F (36.6 C) Oral 76 18 100 % --  11/15/20 0344 (!) 162/86 98.2 F (36.8 C) Oral 75 17 99 % 106.1 kg  11/14/20 2042 (!) 148/81 98.3 F (36.8 C) -- 73 17 99 % --  11/14/20 1625 (!) 154/85 98.5 F (36.9 C) Oral 76 18 99 % --   O/e awake and alert Left foot dressing removed     Chest b/l air entry [id number] CNS non focal   CBC Latest Ref Rng & Units 11/15/2020 11/14/2020 11/13/2020  WBC 4.0 - 10.5 K/uL 13.9(H) 15.3(H) 12.1(H)  Hemoglobin 13.0 - 17.0 g/dL 7.0(L) 7.7(L) 7.4(L)  Hematocrit 39.0 - 52.0 % 23.2(L) 25.2(L) 24.3(L)  Platelets 150 - 400 K/uL 382 346 320    CMP Latest Ref Rng & Units 11/15/2020 11/14/2020 11/13/2020  Glucose 70 - 99 mg/dL 540(G) 867(Y) 195(K)  BUN 6 - 20 mg/dL 10 10 18   Creatinine 0.61 - 1.24 mg/dL 9.32 6.71  Sodium 135 - 145 mmol/L 135 134(L) 137  Potassium 3.5 - 5.1 mmol/L 4.1 4.0 3.7  Chloride 98 - 111 mmol/L 102 104 106  CO2 22 - 32 mmol/L 26 25 25   Calcium 8.9 - 10.3 mg/dL 8.3(L) 8.0(L) 7.7(L)  Total Protein 6.5 - 8.1 g/dL - - -  Total Bilirubin 0.3 - 1.2 mg/dL - - -  Alkaline Phos 38 - 126 U/L - - -  AST 15 - 41 U/L - - -  ALT 0 - 44 U/L - - -    Impression/recommendation 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

## 2020-11-15 NOTE — Progress Notes (Signed)
Mobility Specialist - Progress Note   11/15/20 1155  Mobility  Activity Refused mobility  Mobility performed by Mobility specialist    Pt refused session at this time. No reason specified. States "I'm good right now". Will hold and re-attempt at a later date/time.    Jaycey Gens Mobility Specialist  11/15/20, 11:56 AM

## 2020-11-15 NOTE — Progress Notes (Signed)
[facility]  Daily Progress Note   Subjective  -   No events overnight.  No major changes  Objective Vitals:   11/14/20 2042 11/15/20 0344 11/15/20 0816 11/15/20 1155  BP: (!) 148/81 (!) 162/86 (!) 178/97 126/84  Pulse: 73 75 76 80  Resp: 17 17 18    Temp: 98.3 F (36.8 C) 98.2 F (36.8 C) 97.9 F (36.6 C) 98.4 F (36.9 C)  TempSrc:  Oral Oral Oral  SpO2: 99% 99% 100% 100%  Weight:  106.1 kg    Height:        Intake/Output Summary (Last 24 hours) at 11/15/2020 1445 Last data filed at 11/15/2020 1035 Gross per 24 hour  Intake 360 ml  Output 3680 ml  Net -3320 ml    PULM  CTAB CV  RRR VASC  left foot dressed.  Laboratory CBC    Component Value Date/Time   WBC 13.9 (H) 11/15/2020 0554   HGB 7.0 (L) 11/15/2020 0554   HCT 23.2 (L) 11/15/2020 0554   PLT 382 11/15/2020 0554    BMET    Component Value Date/Time   NA 135 11/15/2020 0554   K 4.1 11/15/2020 0554   CL 102 11/15/2020 0554   CO2 26 11/15/2020 0554   GLUCOSE 242 (H) 11/15/2020 0554   BUN 10 11/15/2020 0554   CREATININE 1.06 11/15/2020 0554   CALCIUM 8.3 (L) 11/15/2020 0554   GFRNONAA >60 11/15/2020 0554   GFRAA >60 05/05/2020 1930    Assessment/Planning:    For left BKA tomorrow.  Risks and benefits discussed.    05/07/2020  11/15/2020, 2:45 PM

## 2020-11-15 NOTE — Progress Notes (Signed)
Inpatient Diabetes Program Recommendations  AACE/ADA: New Consensus Statement on Inpatient Glycemic Control   Target Ranges:  Prepandial:   less than 140 mg/dL      Peak postprandial:   less than 180 mg/dL (1-2 hours)      Critically ill patients:  140 - 180 mg/dL  Results for JARIEL, DROST (MRN [id number]) as of 11/15/2020 07:22  Ref. Range 11/15/2020 05:54  Glucose Latest Ref Range: 70 - 99 mg/dL 580 (H)   Results for KERWIN, AUGUSTUS (MRN [id number]) as of 11/15/2020 07:22  Ref. Range 11/14/2020 08:16 11/14/2020 12:05 11/14/2020 16:26 11/14/2020 20:43  Glucose-Capillary Latest Ref Range: 70 - 99 mg/dL 539 (H) 767 (H) 341 (H) 247 (H)    Review of Glycemic Control  Current orders for Inpatient glycemic control: Lantus 75 units daily, Novolog 5 units TID with meals, Novolog 0-20 units AC&HS  Inpatient Diabetes Program Recommendations:    Insulin: Please consider increasing Lantus to 77 units daily and meal coverage to Novolog 8 units TID with meals.  Thanks, Orlando Penner, RN, MSN, CDE Diabetes Coordinator Inpatient Diabetes Program [phone] (Team Pager from 8am to 5pm)

## 2020-11-15 NOTE — Progress Notes (Signed)
PROGRESS NOTE    Avir Deruiter  BJY:[id number] DOB: Apr 02, 1976 DOA: 11/07/2020 PCP: Tracie Harrier, MD   Brief Narrative:   44 y.o. 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  His blood glucose levels have been significantly elevated.  He admitted to fever and chills at home.  He has been having nausea without vomiting or diarrhea or abdominal pain.  He denied any dyspnea or cough or wheezing.  He admits to polyuria and polydipsia.  He was given doxycycline by Dr. Vickki Muff and has been on it.  His left foot pain has been worsening with associated erythema, swelling and radiation to his left leg.  No chest pain or dyspnea or palpitations.  No dysuria, oliguria or hematuria or flank pain.  Upon presentation to the emergency room, temperature was 101.2 heart rate of 104 and otherwise normal vital signs.  Labs revealed hyponatremia 121 with hypochloremia of 82, hypokalemia 3.4 and significant hyperglycemia blood glucose of 619 and CO2 of 25 with anion gap of 14.  Alk phos was 134 with albumin of 2.7 and troponin 8.5.  Lactic acid was 1.8 CBC showed leukocytosis of 14.5 with neutrophilia and anemia.  Influenza antigens and COVID-19 PCR came back negative.  Blood cultures were drawn.  Two-view chest x-ray showed diffuse subcutaneous gas along the plantar foot extending into the third toe with findings concerning for necrotizing fasciitis and no acute fracture or dislocation.  Dr. Caryl Comes was notified about the patient and evaluated him at bedside   12/22: Postop day #2.  Patient still with some significant pain.  Also endorsing back pain in addition to throbbing pain in the affected extremity. 12/23: Improved pain control  12/25: After discussion with podiatry and vascular surgery decision was made to  take patient back to the OR 11/11/2020 for repeat debridement in an attempt to salvage patient's foot and avoid a BKA.  Patient tolerated procedure well.  This morning patient still complaining of some throbbing pain of the left foot but otherwise stable.  12/27: Surgical dressings removed yesterday by podiatry.  Noted still heavy bleeding and increased purulence on the bandaging.  Unfortunately of left foot does not appear viable at this time.  Podiatry recommending BKA.  They will discuss with vascular surgery for timing.  12/28: Vascular surgery saw and evaluated patient yesterday.  Patient is on the schedule for left BKA Wednesday, 11/16/2020.   Assessment & Plan:   Active Problems:   Necrotizing fasciitis of ankle and foot (HCC)  Left foot necrotizing fasciitis  with associated severe purulent left third toe cellulitisabscess.  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  Pain control improving over interval Group B strep noted on wound cultures No evidence of MRSA Podiatry saw patient 11/10/2020.  Unfortunately nonviable tissue in the foot.   -Initially plan was to proceed with BKA however after repeat discussions with podiatry and vascular surgery decision was made to return patient back to the OR on 11/11/2020 and attempt to salvage left foot and avoid BKA. -LLE appears nonsalvageable.  Vascular surgery on board.  Plan for BKA 11/16/2020 Plan: Continue antibiotics per ID recommendations N.p.o. 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  CT L spine ordered to r/o infection.  None noted Pain control improved Plan: Multimodal pain control  Headache, migraine type Patient endorses pounding retro-orbital headache No history of migraines Plan: Imitrex 25 mg p.o. every 2 hours as needed Consider SQ Imitrex  versus Fioricet if above is ineffective  Nonketotic hyperosmolar hyperglycemia:  Poorly controlled diabetes mellitus  Last hemoglobin A1c 12 Weaned off of insulin drip.  Plan: Continue on lantus, aspart & SSI w/ accuchecks.  DM coordinator consulted.   Leukocytosis:, Resolved secondary to above infection. Continue on IV abxs    Chronic kidney disease stage IIIa:  Creatinine at baseline Monitor daily renal function Avoid nonessential nephrotoxins  Peripheral neuropathy  continue on home dose of gabapentin   Hyponatremia , resolved likely pseudohyponatremia from hyperglycemia  Hypokalemia  KCl repleted. 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  No need for a transfusion at this time.  DVT prophylaxis: SQ Lovenox Code Status: Full Family Communication: Wife at bedside 11/11/2020.  Vascular surgery to update wife on surgical plans Disposition Plan:Status is: Inpatient  Remains inpatient appropriate because:Inpatient level of care appropriate due to severity of illness   Dispo: The patient is from: Home              Anticipated d/c is to: Unknown at this time              Anticipated d/c date is: > 3 days              Patient currently is not medically stable to d/c.   On scheduled for BKA 11/16/2020  Consultants:   Podiatry  Infectious disease  Procedures:   Left foot I&D 11/07/2020  Repeat left foot I&D 11/11/2020  Antimicrobials:    Unasyn   Subjective: Patient seen and examined.  Pain well controlled.  Objective: Vitals:   11/14/20 1625 11/14/20 2042 11/15/20 0344 11/15/20 0816  BP: (!) 154/85 (!) 148/81 (!) 162/86 (!) 178/97  Pulse: 76 73 75 76  Resp: 18 17 17 18   Temp: 98.5 F (36.9 C) 98.3 F (36.8 C) 98.2 F (36.8 C) 97.9 F (36.6 C)  TempSrc: Oral  Oral Oral  SpO2: 99% 99% 99% 100%   Weight:   106.1 kg   Height:        Intake/Output Summary (Last 24 hours) at 11/15/2020 1042 Last data filed at 11/15/2020 1035 Gross per 24 hour  Intake 600 ml  Output 3980 ml  Net -3380 ml   Filed Weights   11/12/20 0406 11/14/20 0425 11/15/20 0344  Weight: 101.3 kg 106.7 kg 106.1 kg    Examination:  General exam: Appears calm and comfortable  Respiratory system: Clear to auscultation. Respiratory effort normal. Cardiovascular system: S1 & S2 heard, RRR. No JVD, murmurs, rubs, gallops or clicks. No pedal edema. Gastrointestinal system: Abdomen is nondistended, soft and nontender. No organomegaly or masses felt. Normal bowel sounds heard. Central nervous system: Alert and oriented. No focal neurological deficits. Extremities: Symmetric 5 x 5 power.  Left foot in surgical wraps, not removed.  Dressings appear C/D/I Skin: No rashes, lesions or ulcers Psychiatry: Judgement and insight appear normal. Mood & affect appropriate.     Data Reviewed: I have personally reviewed following labs and imaging studies  CBC: Recent Labs  Lab 11/11/20 0522 11/12/20 0531 11/13/20 4497 11/14/20 0419  11/15/20 0554  WBC 14.6* 17.7* 12.1* 15.3* 13.9*  NEUTROABS 11.3* 15.8* 8.5*  --  10.4*  HGB 8.2* 8.8* 7.4* 7.7* 7.0*  HCT 25.7* 27.2* 24.3* 25.2* 23.2*  MCV 84.3 84.5 87.1 87.5 87.5  PLT 279 330 320 346 701   Basic Metabolic Panel: Recent Labs  Lab 11/11/20 0522 11/12/20 0531 11/13/20 0412 11/14/20 0419 11/15/20 0554  NA 133* 135 137 134* 135  K 3.5 4.8 3.7 4.0 4.1  CL 100 103 106 104 102  CO2 23 23 25 25 26   GLUCOSE 184* 273* 279* 178* 242*  BUN 12 14 18 10 10   CREATININE 1.19 1.16 1.17 1.01 1.06  CALCIUM 7.7* 8.3* 7.7* 8.0* 8.3*   GFR: Estimated Creatinine Clearance: 108.4 mL/min (by C-G formula based on SCr of 1.06 mg/dL). Liver Function Tests: No results for input(s): AST, ALT, ALKPHOS, BILITOT, PROT, ALBUMIN in the last 168 hours. No results for input(s): LIPASE,  AMYLASE in the last 168 hours. No results for input(s): AMMONIA in the last 168 hours. Coagulation Profile: No results for input(s): INR, PROTIME in the last 168 hours. Cardiac Enzymes: No results for input(s): CKTOTAL, CKMB, CKMBINDEX, TROPONINI in the last 168 hours. BNP (last 3 results) No results for input(s): PROBNP in the last 8760 hours. HbA1C: No results for input(s): HGBA1C in the last 72 hours. CBG: Recent Labs  Lab 11/14/20 0816 11/14/20 1205 11/14/20 1626 11/14/20 2043 11/15/20 0814  GLUCAP 182* 185* 255* 247* 207*   Lipid Profile: No results for input(s): CHOL, HDL, LDLCALC, TRIG, CHOLHDL, LDLDIRECT in the last 72 hours. Thyroid Function Tests: No results for input(s): TSH, T4TOTAL, FREET4, T3FREE, THYROIDAB in the last 72 hours. Anemia Panel: No results for input(s): VITAMINB12, FOLATE, FERRITIN, TIBC, IRON, RETICCTPCT in the last 72 hours. Sepsis Labs: No results for input(s): PROCALCITON, LATICACIDVEN in the last 168 hours.  Recent Results (from the past 240 hour(s))  Urine culture     Status: None   Collection Time: 11/07/20  5:49 PM   Specimen: In/Out Cath Urine  Result Value Ref Range Status   Specimen Description   Final    IN/OUT CATH URINE Performed at [facility], [address] [place], [place] [address]    Special Requests   Final    NONE Performed at [facility], [address] [place], [place] [address]    Culture   Final    NO GROWTH Performed at [facility], [place] [address] [place], [place] [address]    Report Status 11/08/2020 FINAL  Final  Blood Culture (routine x 2)     Status: Abnormal   Collection Time: 11/07/20  5:58 PM   Specimen: BLOOD  Result Value Ref Range Status   Specimen Description   Final    BLOOD RIGHT ANTECUBITAL Performed at [facility], [address] [place], [place] [address]    Special Requests   Final    BOTTLES DRAWN AEROBIC AND ANAEROBIC Blood Culture results may  not be optimal due to an excessive volume of blood received in culture bottles Performed at [facility], [address], [place],  [address]    Culture  Setup Time   Final    GRAM POSITIVE COCCI IN BOTH AEROBIC AND ANAEROBIC BOTTLES CRITICAL VALUE NOTED.  VALUE IS CONSISTENT WITH PREVIOUSLY REPORTED AND CALLED VALUE.    Culture (A)  Final    GROUP B STREP(S.AGALACTIAE)ISOLATED SUSCEPTIBILITIES PERFORMED ON PREVIOUS CULTURE WITHIN THE LAST 5 DAYS. Performed  at [facility], [place] [address] [place], [place] [address]    Report Status 11/11/2020 FINAL  Final  Blood Culture (routine x 2)     Status: Abnormal   Collection Time: 11/07/20  5:58 PM   Specimen: BLOOD  Result Value Ref Range Status   Specimen Description   Final    BLOOD BLOOD LEFT FOREARM Performed at [facility], [address] [place], [place] [address]    Special Requests   Final    BOTTLES DRAWN AEROBIC AND ANAEROBIC Blood Culture adequate volume Performed at [facility], [address] [place], [place] [address]    Culture  Setup Time   Final    Organism ID to follow GRAM POSITIVE COCCI IN BOTH AEROBIC AND ANAEROBIC BOTTLES CRITICAL RESULT CALLED TO, READ BACK BY AND VERIFIED WITH: SUSAN WATSON AT 6144 12/21/821 [place] Performed at [facility], [address] [place], [place] [address]    Culture GROUP B STREP(S.AGALACTIAE)ISOLATED (A)  Final   Report Status 11/10/2020 FINAL  Final   Organism ID, Bacteria GROUP B STREP(S.AGALACTIAE)ISOLATED  Final      Susceptibility   Group b strep(s.agalactiae)isolated - MIC*    CLINDAMYCIN >=1 RESISTANT Resistant     AMPICILLIN <=0.25 SENSITIVE Sensitive     ERYTHROMYCIN 2 RESISTANT Resistant     VANCOMYCIN 0.5 SENSITIVE Sensitive     CEFTRIAXONE <=0.12 SENSITIVE Sensitive     LEVOFLOXACIN 1 SENSITIVE Sensitive     PENICILLIN Value in next row Sensitive      SENSITIVE<=0.06    * GROUP B STREP(S.AGALACTIAE)ISOLATED   Resp Panel by RT-PCR (Flu A&B, Covid) Nasopharyngeal Swab     Status: None   Collection Time: 11/07/20  5:58 PM   Specimen: Nasopharyngeal Swab; Nasopharyngeal(NP) swabs in vial transport medium  Result Value Ref Range Status   SARS Coronavirus 2 by RT PCR NEGATIVE NEGATIVE Final    Comment: (NOTE) SARS-CoV-2 target nucleic acids are NOT DETECTED.  The SARS-CoV-2 RNA is generally detectable in upper respiratory specimens during the acute phase of infection. The lowest concentration of SARS-CoV-2 viral copies this assay can detect is 138 copies/mL. A negative result does not preclude SARS-Cov-2 infection and should not be used as the sole basis for treatment or other patient management decisions. A negative result may occur with  improper specimen collection/handling, submission of specimen other than nasopharyngeal swab, presence of viral mutation(s) within the areas targeted by this assay, and inadequate number of viral copies(<138 copies/mL). A negative result must be combined with clinical observations, patient history, and epidemiological information. The expected result is Negative.  Fact Sheet for Patients:  EntrepreneurPulse.com.au  Fact Sheet for Healthcare Providers:  IncredibleEmployment.be  This test is no t yet approved or cleared by the [place] FDA and  has been authorized for detection and/or diagnosis of SARS-CoV-2 by FDA under an Emergency Use Authorization (EUA). This EUA will remain  in effect (meaning this test can be used) for the duration of the COVID-19 declaration under Section 564(b)(1) of the Act, 21 U.S.C.section 360bbb-3(b)(1), unless the authorization is terminated  or revoked sooner.       Influenza A by PCR NEGATIVE NEGATIVE Final   Influenza B by PCR NEGATIVE NEGATIVE Final    Comment: (NOTE) The Xpert Xpress SARS-CoV-2/FLU/RSV plus assay is intended as an aid in the diagnosis of influenza from  Nasopharyngeal swab specimens and should not be used as a sole basis for treatment. Nasal washings and aspirates are unacceptable  for Xpert Xpress SARS-CoV-2/FLU/RSV testing.  Fact Sheet for Patients: EntrepreneurPulse.com.au  Fact Sheet for Healthcare Providers: IncredibleEmployment.be  This test is not yet approved or cleared by the [place] FDA and has been authorized for detection and/or diagnosis of SARS-CoV-2 by FDA under an Emergency Use Authorization (EUA). This EUA will remain in effect (meaning this test can be used) for the duration of the COVID-19 declaration under Section 564(b)(1) of the Act, 21 U.S.C. section 360bbb-3(b)(1), unless the authorization is terminated or revoked.  Performed at [facility], [address] [place], [place] [address]   Blood Culture ID Panel (Reflexed)     Status: Abnormal   Collection Time: 11/07/20  5:58 PM  Result Value Ref Range Status   Enterococcus faecalis NOT DETECTED NOT DETECTED Final   Enterococcus Faecium NOT DETECTED NOT DETECTED Final   Listeria monocytogenes NOT DETECTED NOT DETECTED Final   Staphylococcus species NOT DETECTED NOT DETECTED Final   Staphylococcus aureus (BCID) NOT DETECTED NOT DETECTED Final   Staphylococcus epidermidis NOT DETECTED NOT DETECTED Final   Staphylococcus lugdunensis NOT DETECTED NOT DETECTED Final   Streptococcus species DETECTED (A) NOT DETECTED Final    Comment: CRITICAL RESULT CALLED TO, READ BACK BY AND VERIFIED WITH:  SUSAN WATSON AT 1307 11/08/20 SDR    Streptococcus agalactiae DETECTED (A) NOT DETECTED Final    Comment: CRITICAL RESULT CALLED TO, READ BACK BY AND VERIFIED WITH:  SUSAN WATSON AT 1307 11/08/20 SDR    Streptococcus pneumoniae NOT DETECTED NOT DETECTED Final   Streptococcus pyogenes NOT DETECTED NOT DETECTED Final   A.calcoaceticus-baumannii NOT DETECTED NOT DETECTED Final   Bacteroides fragilis NOT DETECTED NOT  DETECTED Final   Enterobacterales NOT DETECTED NOT DETECTED Final   Enterobacter cloacae complex NOT DETECTED NOT DETECTED Final   Escherichia coli NOT DETECTED NOT DETECTED Final   Klebsiella aerogenes NOT DETECTED NOT DETECTED Final   Klebsiella oxytoca NOT DETECTED NOT DETECTED Final   Klebsiella pneumoniae NOT DETECTED NOT DETECTED Final   Proteus species NOT DETECTED NOT DETECTED Final   Salmonella species NOT DETECTED NOT DETECTED Final   Serratia marcescens NOT DETECTED NOT DETECTED Final   Haemophilus influenzae NOT DETECTED NOT DETECTED Final   Neisseria meningitidis NOT DETECTED NOT DETECTED Final   Pseudomonas aeruginosa NOT DETECTED NOT DETECTED Final   Stenotrophomonas maltophilia NOT DETECTED NOT DETECTED Final   Candida albicans NOT DETECTED NOT DETECTED Final   Candida auris NOT DETECTED NOT DETECTED Final   Candida glabrata NOT DETECTED NOT DETECTED Final   Candida krusei NOT DETECTED NOT DETECTED Final   Candida parapsilosis NOT DETECTED NOT DETECTED Final   Candida tropicalis NOT DETECTED NOT DETECTED Final   Cryptococcus neoformans/gattii NOT DETECTED NOT DETECTED Final    Comment: Performed at [facility], [address] [place], [place] [address]  MRSA PCR Screening     Status: None   Collection Time: 11/07/20  8:03 PM   Specimen: Nasopharyngeal  Result Value Ref Range Status   MRSA by PCR NEGATIVE NEGATIVE Final    Comment:        The GeneXpert MRSA Assay (FDA approved for NASAL specimens only), is one component of a comprehensive MRSA colonization surveillance program. It is not intended to diagnose MRSA infection nor to guide or monitor treatment for MRSA infections. Performed at [facility], [address], [place], [place] [address]   Anaerobic culture     Status: Abnormal   Collection Time: 11/07/20  8:13 PM  Specimen: PATH Other  Result Value Ref Range Status   Specimen Description   Final    TOE LEFT Performed at  [facility], [address] [place], [place] [address]    Special Requests BONE  Final   Gram Stain   Final    RARE WBC PRESENT,BOTH PMN AND MONONUCLEAR MODERATE GRAM POSITIVE COCCI IN PAIRS IN CLUSTERS    Culture (A)  Final    PREVOTELLA DISIENS BETA LACTAMASE POSITIVE Performed at [facility], [place] [address], [place], [place] [address]    Report Status 11/12/2020 FINAL  Final  Aerobic/Anaerobic Culture (surgical/deep wound)     Status: None   Collection Time: 11/07/20  8:13 PM   Specimen: PATH Other; Abscess  Result Value Ref Range Status   Specimen Description   Final    ABSCESS Performed at [facility], [address] [place], [place] [address]    Special Requests   Final    LEFT FOOT Performed at [facility], [place], [place] [address]    Gram Stain   Final    RARE WBC PRESENT,BOTH PMN AND MONONUCLEAR ABUNDANT GRAM POSITIVE COCCI IN PAIRS IN CLUSTERS    Culture   Final    FEW GROUP B STREP(S.AGALACTIAE)ISOLATED TESTING AGAINST S. AGALACTIAE NOT ROUTINELY PERFORMED DUE TO PREDICTABILITY OF AMP/PEN/VAN SUSCEPTIBILITY. FEW PREVOTELLA DISIENS BETA LACTAMASE POSITIVE Performed at [facility], [place] [address] [place], [place] [address]    Report Status 11/12/2020 FINAL  Final  Aerobic Culture (superficial specimen)     Status: None   Collection Time: 11/07/20  8:13 PM   Specimen: PATH Other  Result Value Ref Range Status   Specimen Description   Final    ABSCESS Performed at [facility], [place] [place], [place] [address]    Special Requests LEFT FOOT BONE  Final   Gram Stain   Final    NO WBC SEEN FEW GRAM POSITIVE COCCI IN PAIRS IN CLUSTERS    Culture   Final    MODERATE GROUP B STREP(S.AGALACTIAE)ISOLATED TESTING AGAINST S. AGALACTIAE NOT ROUTINELY PERFORMED DUE TO PREDICTABILITY OF AMP/PEN/VAN SUSCEPTIBILITY. Performed at [facility], [place] [address], [place], [place]  [address]    Report Status 11/10/2020 FINAL  Final  CULTURE, BLOOD (ROUTINE X 2) w Reflex to ID Panel     Status: None (Preliminary result)   Collection Time: 11/11/20 12:48 PM   Specimen: BLOOD  Result Value Ref Range Status   Specimen Description BLOOD BLOOD RIGHT HAND  Final   Special Requests   Final    BOTTLES DRAWN AEROBIC AND ANAEROBIC Blood Culture adequate volume   Culture   Final    NO GROWTH 4 DAYS Performed at [facility], [address], [place],  [address]    Report Status PENDING  Incomplete  CULTURE, BLOOD (ROUTINE X 2) w Reflex to ID Panel     Status: None (Preliminary result)   Collection Time: 11/11/20  1:06 PM   Specimen: BLOOD  Result Value Ref Range Status   Specimen Description BLOOD LEFT ANTECUBITAL  Final   Special Requests   Final    BOTTLES DRAWN AEROBIC AND ANAEROBIC Blood Culture adequate volume   Culture   Final    NO GROWTH 4 DAYS Performed at [facility], [address], [place],  [address]    Report Status PENDING  Incomplete  Radiology Studies: No results found.      Scheduled Meds: . acetaminophen  650 mg Oral Q6H  . atorvastatin  40 mg Oral Daily  . busPIRone  15 mg Oral BID  . Chlorhexidine Gluconate Cloth  6 each Topical Daily  . dicyclomine  10 mg Oral TID AC & HS  . enoxaparin (LOVENOX) injection  50 mg Subcutaneous Daily  . famotidine  20 mg Oral BID  . ferrous sulfate  325 mg Oral BID WC  . gabapentin  600 mg Oral QHS  . insulin aspart  0-20 Units Subcutaneous TID AC & HS  . insulin aspart  7 Units Subcutaneous TID WC  . insulin glargine  77 Units Subcutaneous Daily  . lidocaine  1 patch Transdermal Q24H  . methocarbamol  750 mg Oral QID  . metoCLOPramide  10 mg Oral TID AC  . multivitamin with minerals  1 tablet Oral Daily  . pantoprazole  40 mg Oral Daily  . sertraline  100 mg Oral Daily  . vitamin B-12  500 mcg Oral Daily   Continuous Infusions: . sodium chloride Stopped  (11/13/20 0515)  . sodium chloride    . ampicillin-sulbactam (UNASYN) IV 3 g (11/15/20 0855)  . lactated ringers 75 mL/hr at 11/15/20 0310     LOS: 8 days    Time spent: 15 minutes    Sidney Ace, MD [facility] Pager [phone]  If 7PM-7AM, please contact night-coverage 11/15/2020, 10:42 AM

## 2020-11-16 ENCOUNTER — Encounter
Admission: EM | Disposition: A | Discharge status: Home Health | Payer: Self-pay | Source: Home / Self Care | Attending: Internal Medicine

## 2020-11-16 ENCOUNTER — Inpatient Hospital Stay: Payer: Medicaid Other | Admitting: Certified Registered Nurse Anesthetist

## 2020-11-16 ENCOUNTER — Encounter: Payer: Self-pay | Admitting: Family Medicine

## 2020-11-16 DIAGNOSIS — A419 Sepsis, unspecified organism: Secondary | ICD-10-CM

## 2020-11-16 DIAGNOSIS — B951 Streptococcus, group B, as the cause of diseases classified elsewhere: Secondary | ICD-10-CM | POA: Diagnosis not present

## 2020-11-16 DIAGNOSIS — E11628 Type 2 diabetes mellitus with other skin complications: Secondary | ICD-10-CM | POA: Diagnosis not present

## 2020-11-16 DIAGNOSIS — E119 Type 2 diabetes mellitus without complications: Secondary | ICD-10-CM

## 2020-11-16 DIAGNOSIS — M726 Necrotizing fasciitis: Secondary | ICD-10-CM

## 2020-11-16 DIAGNOSIS — L089 Local infection of the skin and subcutaneous tissue, unspecified: Secondary | ICD-10-CM | POA: Diagnosis not present

## 2020-11-16 DIAGNOSIS — R7881 Bacteremia: Secondary | ICD-10-CM | POA: Diagnosis not present

## 2020-11-16 HISTORY — PX: AMPUTATION: SHX166

## 2020-11-16 LAB — CULTURE, BLOOD (ROUTINE X 2)
Culture: NO GROWTH
Culture: NO GROWTH
Special Requests: ADEQUATE
Special Requests: ADEQUATE

## 2020-11-16 LAB — GLUCOSE, CAPILLARY
Glucose-Capillary: 191 mg/dL — ABNORMAL HIGH (ref 70–99)
Glucose-Capillary: 129 mg/dL — ABNORMAL HIGH (ref 70–99)
Glucose-Capillary: 131 mg/dL — ABNORMAL HIGH (ref 70–99)
Glucose-Capillary: 169 mg/dL — ABNORMAL HIGH (ref 70–99)
Glucose-Capillary: 232 mg/dL — ABNORMAL HIGH (ref 70–99)
Glucose-Capillary: 452 mg/dL — ABNORMAL HIGH (ref 70–99)

## 2020-11-16 LAB — SURGICAL PATHOLOGY

## 2020-11-16 LAB — BASIC METABOLIC PANEL
Anion gap: 10 (ref 5–15)
BUN: 13 mg/dL (ref 6–20)
CO2: 25 mmol/L (ref 22–32)
Calcium: 8.4 mg/dL — ABNORMAL LOW (ref 8.9–10.3)
Chloride: 101 mmol/L (ref 98–111)
Creatinine, Ser: 1.09 mg/dL (ref 0.61–1.24)
GFR, Estimated: >60 mL/min (ref >60)
Glucose, Bld: 226 mg/dL — ABNORMAL HIGH (ref 70–99)
Potassium: 3.9 mmol/L (ref 3.5–5.1)
Sodium: 136 mmol/L (ref 135–145)

## 2020-11-16 LAB — CBC WITH DIFFERENTIAL/PLATELET
Abs Immature Granulocytes: 0.3 10*3/uL — ABNORMAL HIGH (ref 0.00–0.07)
Basophils Absolute: 0 10*3/uL (ref 0.0–0.1)
Basophils Relative: 0 %
Eosinophils Absolute: 0.2 10*3/uL (ref 0.0–0.5)
Eosinophils Relative: 2 %
HCT: 24.5 % — ABNORMAL LOW (ref 39.0–52.0)
Hemoglobin: 7.5 g/dL — ABNORMAL LOW (ref 13.0–17.0)
Immature Granulocytes: 3 %
Lymphocytes Relative: 18 %
Lymphs Abs: 2.1 10*3/uL (ref 0.7–4.0)
MCH: 27 pg (ref 26.0–34.0)
MCHC: 30.6 g/dL (ref 30.0–36.0)
MCV: 88.1 fL (ref 80.0–100.0)
Monocytes Absolute: 0.8 10*3/uL (ref 0.1–1.0)
Monocytes Relative: 7 %
Neutro Abs: 8.2 10*3/uL — ABNORMAL HIGH (ref 1.7–7.7)
Neutrophils Relative %: 70 %
Platelets: 408 10*3/uL — ABNORMAL HIGH (ref 150–400)
RBC: 2.78 MIL/uL — ABNORMAL LOW (ref 4.22–5.81)
RDW: 15.9 % — ABNORMAL HIGH (ref 11.5–15.5)
WBC: 11.6 10*3/uL — ABNORMAL HIGH (ref 4.0–10.5)
nRBC: 0 % (ref 0.0–0.2)

## 2020-11-16 LAB — GLUCOSE, RANDOM: Glucose, Bld: 552 mg/dL (ref 70–99)

## 2020-11-16 SURGERY — AMPUTATION BELOW KNEE
Anesthesia: General | Site: Knee | Laterality: Left

## 2020-11-16 MED ORDER — SUGAMMADEX SODIUM 200 MG/2ML IV SOLN
INTRAVENOUS | Status: DC | PRN
  Administered 2020-11-16: 50 mg via INTRAVENOUS

## 2020-11-16 MED ORDER — FENTANYL CITRATE (PF) 100 MCG/2ML IJ SOLN
INTRAMUSCULAR | Status: AC
  Administered 2020-11-16: 25 ug via INTRAVENOUS
  Filled 2020-11-16: qty 2

## 2020-11-16 MED ORDER — ONDANSETRON HCL 4 MG/2ML IJ SOLN
INTRAMUSCULAR | Status: DC | PRN
  Administered 2020-11-16: 4 mg via INTRAVENOUS

## 2020-11-16 MED ORDER — DEXAMETHASONE SODIUM PHOSPHATE 10 MG/ML IJ SOLN
INTRAMUSCULAR | Status: DC | PRN
  Administered 2020-11-16: 10 mg via INTRAVENOUS

## 2020-11-16 MED ORDER — HYDROMORPHONE HCL 2 MG PO TABS
2.0000 mg | ORAL_TABLET | Freq: Four times a day (QID) | ORAL | Status: DC | PRN
  Administered 2020-11-16 – 2020-11-17 (×3): 2 mg via ORAL
  Filled 2020-11-16 (×3): qty 1

## 2020-11-16 MED ORDER — HYDROMORPHONE HCL 1 MG/ML IJ SOLN
0.2500 mg | Freq: Once | INTRAMUSCULAR | Status: AC
Start: 2020-11-16 — End: 2020-11-16

## 2020-11-16 MED ORDER — HYDROMORPHONE HCL 1 MG/ML IJ SOLN
INTRAMUSCULAR | Status: AC
  Administered 2020-11-16: 0.5 mg via INTRAVENOUS
  Filled 2020-11-16: qty 1

## 2020-11-16 MED ORDER — LIDOCAINE HCL (PF) 2 % IJ SOLN
INTRAMUSCULAR | Status: AC
  Filled 2020-11-16: qty 5

## 2020-11-16 MED ORDER — HYDROMORPHONE HCL 1 MG/ML IJ SOLN
0.2500 mg | Freq: Once | INTRAMUSCULAR | Status: AC
  Administered 2020-11-16: 0.25 mg via INTRAVENOUS

## 2020-11-16 MED ORDER — ACETAMINOPHEN 10 MG/ML IV SOLN
INTRAVENOUS | Status: DC | PRN
  Administered 2020-11-16: 1000 mg via INTRAVENOUS

## 2020-11-16 MED ORDER — FENTANYL CITRATE (PF) 100 MCG/2ML IJ SOLN
25.0000 ug | INTRAMUSCULAR | Status: DC | PRN
Start: 2020-11-16 — End: 2020-11-16
  Administered 2020-11-16 (×2): 25 ug via INTRAVENOUS

## 2020-11-16 MED ORDER — SUCCINYLCHOLINE CHLORIDE 20 MG/ML IJ SOLN
INTRAMUSCULAR | Status: DC | PRN
  Administered 2020-11-16: 120 mg via INTRAVENOUS

## 2020-11-16 MED ORDER — MIDAZOLAM HCL 2 MG/2ML IJ SOLN
INTRAMUSCULAR | Status: AC
  Filled 2020-11-16: qty 2

## 2020-11-16 MED ORDER — SODIUM CHLORIDE 0.9% IV SOLUTION: Freq: Once | INTRAVENOUS | Status: AC

## 2020-11-16 MED ORDER — DEXMEDETOMIDINE HCL 200 MCG/2ML IV SOLN
INTRAVENOUS | Status: DC | PRN
  Administered 2020-11-16 (×2): 8 ug via INTRAVENOUS

## 2020-11-16 MED ORDER — FENTANYL CITRATE (PF) 100 MCG/2ML IJ SOLN
INTRAMUSCULAR | Status: AC
  Filled 2020-11-16: qty 2

## 2020-11-16 MED ORDER — HYDROMORPHONE HCL 1 MG/ML IJ SOLN
INTRAMUSCULAR | Status: AC
  Administered 2020-11-16: 0.25 mg via INTRAVENOUS
  Filled 2020-11-16: qty 1

## 2020-11-16 MED ORDER — MIDAZOLAM HCL 2 MG/2ML IJ SOLN
INTRAMUSCULAR | Status: DC | PRN
  Administered 2020-11-16: 2 mg via INTRAVENOUS

## 2020-11-16 MED ORDER — OXYCODONE-ACETAMINOPHEN 5-325 MG PO TABS: 1.0000 | ORAL_TABLET | ORAL | Status: DC | PRN

## 2020-11-16 MED ORDER — KETOROLAC TROMETHAMINE 30 MG/ML IJ SOLN: 30.0000 mg | Freq: Once | INTRAMUSCULAR | Status: AC

## 2020-11-16 MED ORDER — KETOROLAC TROMETHAMINE 30 MG/ML IJ SOLN
INTRAMUSCULAR | Status: AC
  Administered 2020-11-16: 30 mg via INTRAVENOUS
  Filled 2020-11-16: qty 1

## 2020-11-16 MED ORDER — HYDROMORPHONE HCL 1 MG/ML IJ SOLN
0.5000 mg | INTRAMUSCULAR | Status: AC | PRN
  Administered 2020-11-16: 0.5 mg via INTRAVENOUS

## 2020-11-16 MED ORDER — HYDROMORPHONE HCL 1 MG/ML IJ SOLN
0.2500 mg | INTRAMUSCULAR | Status: DC | PRN
Start: 2020-11-16 — End: 2020-11-16
  Administered 2020-11-16 (×3): 0.25 mg via INTRAVENOUS

## 2020-11-16 MED ORDER — FENTANYL CITRATE (PF) 100 MCG/2ML IJ SOLN
INTRAMUSCULAR | Status: DC | PRN
  Administered 2020-11-16 (×6): 50 ug via INTRAVENOUS

## 2020-11-16 MED ORDER — ONDANSETRON HCL 4 MG/2ML IJ SOLN: 4.0000 mg | Freq: Once | INTRAMUSCULAR | Status: DC | PRN

## 2020-11-16 MED ORDER — ROCURONIUM BROMIDE 100 MG/10ML IV SOLN
INTRAVENOUS | Status: DC | PRN
  Administered 2020-11-16: 5 mg via INTRAVENOUS

## 2020-11-16 MED ORDER — HYDROMORPHONE HCL 1 MG/ML IJ SOLN
INTRAMUSCULAR | Status: AC
  Filled 2020-11-16: qty 1

## 2020-11-16 MED ORDER — PROPOFOL 10 MG/ML IV BOLUS
INTRAVENOUS | Status: DC | PRN
  Administered 2020-11-16: 150 mg via INTRAVENOUS

## 2020-11-16 MED ORDER — LIDOCAINE HCL (CARDIAC) PF 100 MG/5ML IV SOSY
PREFILLED_SYRINGE | INTRAVENOUS | Status: DC | PRN
  Administered 2020-11-16: 80 mg via INTRAVENOUS

## 2020-11-16 MED ORDER — FAMOTIDINE 20 MG PO TABS
ORAL_TABLET | ORAL | Status: AC
  Filled 2020-11-16: qty 1

## 2020-11-16 MED ORDER — ONDANSETRON HCL 4 MG/2ML IJ SOLN
INTRAMUSCULAR | Status: AC
  Filled 2020-11-16: qty 2

## 2020-11-16 MED ORDER — EPHEDRINE SULFATE 50 MG/ML IJ SOLN
INTRAMUSCULAR | Status: DC | PRN
  Administered 2020-11-16: 10 mg via INTRAVENOUS

## 2020-11-16 MED ORDER — HYDROMORPHONE HCL 1 MG/ML IJ SOLN: 0.2500 mg | INTRAMUSCULAR | Status: DC | PRN

## 2020-11-16 MED ORDER — DEXAMETHASONE SODIUM PHOSPHATE 10 MG/ML IJ SOLN
INTRAMUSCULAR | Status: AC
  Filled 2020-11-16: qty 1

## 2020-11-16 MED ORDER — HYDROMORPHONE HCL 1 MG/ML IJ SOLN: 0.5000 mg | Freq: Once | INTRAMUSCULAR | Status: DC

## 2020-11-16 MED ORDER — HYDROMORPHONE HCL 1 MG/ML IJ SOLN
0.5000 mg | INTRAMUSCULAR | Status: AC | PRN
Start: 2020-11-16 — End: 2020-11-16
  Administered 2020-11-16 (×2): 0.5 mg via INTRAVENOUS

## 2020-11-16 MED ORDER — PHENYLEPHRINE HCL (PRESSORS) 10 MG/ML IV SOLN
INTRAVENOUS | Status: DC | PRN
  Administered 2020-11-16: 100 ug via INTRAVENOUS
  Administered 2020-11-16 (×2): 200 ug via INTRAVENOUS

## 2020-11-16 MED ORDER — EPHEDRINE 5 MG/ML INJ
INTRAVENOUS | Status: AC
  Filled 2020-11-16: qty 10

## 2020-11-16 MED ORDER — PROPOFOL 10 MG/ML IV BOLUS
INTRAVENOUS | Status: AC
  Filled 2020-11-16: qty 20

## 2020-11-16 MED ORDER — ACETAMINOPHEN 10 MG/ML IV SOLN
INTRAVENOUS | Status: AC
  Filled 2020-11-16: qty 100

## 2020-11-16 SURGICAL SUPPLY — 42 items
BLADE SAGITTAL AGGR TOOTH XLG (BLADE) ×1 IMPLANT
BLADE SAGITTAL WIDE XTHICK NO (BLADE) IMPLANT
BLADE SAW SAG 25.4X90 (BLADE) ×2 IMPLANT
BNDG CMPR STD VLCR NS LF 5.8X6 (GAUZE/BANDAGES/DRESSINGS) ×1
BNDG COHESIVE 4X5 TAN STRL (GAUZE/BANDAGES/DRESSINGS) ×2 IMPLANT
BNDG ELASTIC 6X5.8 VLCR NS LF (GAUZE/BANDAGES/DRESSINGS) ×2 IMPLANT
BNDG GAUZE 4.5X4.1 6PLY STRL (MISCELLANEOUS) ×4 IMPLANT
BRUSH SCRUB EZ  4% CHG (MISCELLANEOUS) ×2
BRUSH SCRUB EZ 4% CHG (MISCELLANEOUS) ×1 IMPLANT
CANISTER SUCT 1200ML W/VALVE (MISCELLANEOUS) ×2 IMPLANT
CHLORAPREP W/TINT 26ML (MISCELLANEOUS) ×2 IMPLANT
COVER WAND RF STERILE (DRAPES) ×2 IMPLANT
DRAIN PENROSE 1/4X12 LTX STRL (WOUND CARE) IMPLANT
DRAPE INCISE IOBAN 66X45 STRL (DRAPES) IMPLANT
ELECT CAUTERY BLADE 6.4 (BLADE) ×2 IMPLANT
ELECT REM PT RETURN 9FT ADLT (ELECTROSURGICAL) ×2
ELECTRODE REM PT RTRN 9FT ADLT (ELECTROSURGICAL) ×1 IMPLANT
GAUZE XEROFORM 1X8 LF (GAUZE/BANDAGES/DRESSINGS) ×4 IMPLANT
GLOVE BIO SURGEON STRL SZ7 (GLOVE) ×4 IMPLANT
GLOVE INDICATOR 7.5 STRL GRN (GLOVE) ×2 IMPLANT
GOWN STRL REUS W/ TWL LRG LVL3 (GOWN DISPOSABLE) ×2 IMPLANT
GOWN STRL REUS W/ TWL XL LVL3 (GOWN DISPOSABLE) ×1 IMPLANT
GOWN STRL REUS W/TWL LRG LVL3 (GOWN DISPOSABLE) ×4
GOWN STRL REUS W/TWL XL LVL3 (GOWN DISPOSABLE) ×2
HANDLE YANKAUER SUCT BULB TIP (MISCELLANEOUS) ×2 IMPLANT
KIT TURNOVER KIT A (KITS) ×2 IMPLANT
LABEL OR SOLS (LABEL) ×2 IMPLANT
MANIFOLD NEPTUNE II (INSTRUMENTS) ×2 IMPLANT
NS IRRIG 1000ML POUR BTL (IV SOLUTION) ×2 IMPLANT
PACK EXTREMITY ARMC (MISCELLANEOUS) ×2 IMPLANT
PAD ABD DERMACEA PRESS 5X9 (GAUZE/BANDAGES/DRESSINGS) ×4 IMPLANT
PAD PREP 24X41 OB/GYN DISP (PERSONAL CARE ITEMS) ×2 IMPLANT
SPONGE LAP 18X18 RF (DISPOSABLE) ×2 IMPLANT
STAPLER SKIN PROX 35W (STAPLE) ×2 IMPLANT
STOCKINETTE M/LG 89821 (MISCELLANEOUS) ×2 IMPLANT
SUT SILK 2 0 (SUTURE) ×4
SUT SILK 2 0 SH (SUTURE) ×4 IMPLANT
SUT SILK 2-0 18XBRD TIE 12 (SUTURE) ×1 IMPLANT
SUT SILK 3 0 (SUTURE) ×2
SUT SILK 3-0 18XBRD TIE 12 (SUTURE) ×1 IMPLANT
SUT VIC AB 0 CT1 36 (SUTURE) ×5 IMPLANT
SUT VIC AB 2-0 CT1 (SUTURE) ×4 IMPLANT

## 2020-11-16 NOTE — Op Note (Signed)
   OPERATIVE NOTE   PROCEDURE: Left below-the-knee amputation  PRE-OPERATIVE DIAGNOSIS: Left foot abscess, unsalvageable foot  POST-OPERATIVE DIAGNOSIS: same as above  SURGEON: Festus Barren, MD  ASSISTANT(S): Louisa Second, MD  ANESTHESIA: general  ESTIMATED BLOOD LOSS: 150 cc  FINDING(S): none  SPECIMEN(S):  Left below-the-knee amputation  INDICATIONS:   Richard Mendoza is a 44 y.o. male who presents with left foot abscess and an unsalvageable foot.  The patient is scheduled for a left below-the-knee amputation.  I discussed in depth with the patient the risks, benefits, and alternatives to this procedure.  The patient is aware that the risk of this operation included but are not limited to:  bleeding, infection, myocardial infarction, stroke, death, failure to heal amputation wound, and possible need for more proximal amputation.  The patient is aware of the risks and agrees proceed forward with the procedure.  DESCRIPTION:  After full informed written consent was obtained from the patient, the patient was brought back to the operating room, and placed supine upon the operating table.  Prior to induction, the patient received IV antibiotics.  The patient was then prepped and draped in the standard fashion for a below-the-knee amputation.  After obtaining adequate anesthesia, the patient was prepped and draped in the standard fashion for a left below-the-knee amputation.  I marked out the anterior incision two finger breadths below the tibial tuberosity and then the marked out a posterior flap that was one third of the circumference of the calf in length.   I made the incisions for these flaps, and then dissected through the subcutaneous tissue, fascia, and muscle anteriorly.  I elevated  the periosteal tissue superiorly so that the tibia was about 3-4 cm shorter than the anterior skin flap.  I then transected the tibia with a power saw and then took a wedge off the tibia anteriorly with  the power saw.  Then I smoothed out the rough edges.  In a similar fashion, I cut back the fibula about two centimeters higher than the level of the tibia with a bone cutter.  I put a bone hook into the distal tibia and then used a large amputation knife to sharply develop a tissue plane through the muscle along the fibula.  In such fashion, the posterior flap was developed.  At this point, the specimen was passed off the field as the below-the-knee amputation.  At this point, I clamped all visibly bleeding arteries and veins using a combination of suture ligation with Silk suture and electrocautery.  Bleeding continued to be controlled with electrocautery and suture ligature.  The stump was washed off with sterile normal saline and no further active bleeding was noted.  I reapproximated the anterior and posterior fascia  with interrupted stitches of 0 Vicryl.  This was completed along the entire length of anterior and posterior fascia until there were no more loose space in the fascial line. I then placed a layer of 2-0 Vicryl sutures in the subcutaneous tissue. The skin was then  reapproximated with staples.  The stump was washed off and dried.  The incision was dressed with Xeroform and  then fluffs were applied.  Kerlix was wrapped around the leg and then gently an ACE wrap was applied.    COMPLICATIONS: none  CONDITION: stable   Festus Barren  11/16/2020, 12:43 PM    This note was created with Dragon Medical transcription system. Any errors in dictation are purely unintentional.

## 2020-11-16 NOTE — Progress Notes (Signed)
ID Pt underwent left BKA today Has pain Received PRBC today  BP (!) 153/86 (BP Location: Right Arm)   Pulse 74   Temp 98 F (36.7 C)   Resp 16   Ht 5\' 10"  (1.778 m)   Wt 106.1 kg   SpO2 100%   BMI 33.55 kg/m    O/e Awake and alert Chest b/l air entry [id number] abd soft Left BKA- surgical dressing   Labs CBC Latest Ref Rng & Units 11/16/2020 11/15/2020 11/14/2020  WBC 4.0 - 10.5 K/uL 11.6(H) 13.9(H) 15.3(H)  Hemoglobin 13.0 - 17.0 g/dL 7.5(L) 7.0(L) 7.7(L)  Hematocrit 39.0 - 52.0 % 24.5(L) 23.2(L) 25.2(L)  Platelets 150 - 400 K/uL 408(H) 382 346    CMP Latest Ref Rng & Units 11/16/2020 11/15/2020 11/14/2020  Glucose 70 - 99 mg/dL 11/16/2020) 147(W) 295(A)  BUN 6 - 20 mg/dL 13 10 10   Creatinine 0.61 - 1.24 mg/dL 213(Y 8.65  Sodium 135 - 145 mmol/L 136 135 134(L)  Potassium 3.5 - 5.1 mmol/L 3.9 4.1 4.0  Chloride 98 - 111 mmol/L 101 102 104  CO2 22 - 32 mmol/L 25 26 25   Calcium 8.9 - 10.3 mg/dL 7.84) 8.3(L) 8.0(L)  Total Protein 6.5 - 8.1 g/dL - - -  Total Bilirubin 0.3 - 1.2 mg/dL - - -  Alkaline Phos 38 - 126 U/L - - -  AST 15 - 41 U/L - - -  ALT 0 - 44 U/L - - -     Impression/recommendation 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

## 2020-11-16 NOTE — Anesthesia Procedure Notes (Signed)
Procedure Name: Intubation Date/Time: 11/16/2020 11:13 AM Performed by: Hezzie Bump, CRNA Pre-anesthesia Checklist: Patient identified, Patient being monitored, Timeout performed, Emergency Drugs available and Suction available Patient Re-evaluated:Patient Re-evaluated prior to induction Oxygen Delivery Method: Circle system utilized Preoxygenation: Pre-oxygenation with 100% oxygen Induction Type: IV induction Ventilation: Mask ventilation without difficulty and Oral airway inserted - appropriate to patient size Laryngoscope Size: McGraph and 4 Grade View: Grade I Tube type: Oral Tube size: 7.5 mm Number of attempts: 1 Airway Equipment and Method: Stylet Placement Confirmation: ETT inserted through vocal cords under direct vision,  positive ETCO2 and breath sounds checked- equal and bilateral Secured at: 21 cm Tube secured with: Tape Dental Injury: Teeth and Oropharynx as per pre-operative assessment

## 2020-11-16 NOTE — Progress Notes (Deleted)
Patient remains to c/o pain that is difficult to tolerate especially with moving.  Have discussed with dr Noralyn Pick.  meds ordered. Will continue to monitor.

## 2020-11-16 NOTE — Transfer of Care (Signed)
Immediate Anesthesia Transfer of Care Note  Patient: Richard Mendoza  Procedure(s) Performed: AMPUTATION BELOW KNEE (Left Knee)  Patient Location: PACU  Anesthesia Type:General  Level of Consciousness: awake, alert  and oriented  Airway & Oxygen Therapy: Patient Spontanous Breathing and Patient connected to face mask oxygen  Post-op Assessment: Report given to RN and Post -op Vital signs reviewed and stable  Post vital signs: Reviewed and stable  Last Vitals:  Vitals Value Taken Time  BP 149/104 11/16/20 1243  Temp 36.3 C 11/16/20 1243  Pulse 76 11/16/20 1246  Resp 13 11/16/20 1246  SpO2 100 % 11/16/20 1246  Vitals shown include unvalidated device data.  Last Pain:  Vitals:   11/16/20 1029  TempSrc:   PainSc: 5       Patients Stated Pain Goal: 0 (11/15/20 1814)  Complications: No complications documented.

## 2020-11-16 NOTE — Progress Notes (Signed)
PROGRESS NOTE    Richard Mendoza  XTG:[id number] DOB: 1976-08-20 DOA: 11/07/2020 PCP: Tracie Harrier, MD   Brief Narrative: 44 year old male with type II uncontrolled diabetes on insulin pump, hypertension, dyslipidemia presents to the ED with left foot infection acute, worsening, was referred by podiatrist.  Patient endorsed uncontrolled blood sugar level, also nausea, fever and chills at home.  He was on doxycycline by Dr. Vickki Muff but infection was not improving. He in the ED he was febrile and lab with a leukocytosis, COVID-19 negative, left foot x-ray diffuse subcutaneous gas along the plantar foot extending to the third toe concerning for necrotizing fasciitis, patient was seen by Dr. Caryl Comes, patient had undergone left foot debridement and drainage of abscess by podiatry but is still with WBC count elevated with excessive purulence and seen by vascular surgery and eventually left foot is not salvageable and has been advised left BKA for 12/29 Is followed by infectious disease. Subjective:  Seen this am.Resting, wife at bedside, awaiting for surgery, c/o LEFT FOOT PAIN No acute events overnight.   Assessment & Plan:  Diabetic foot infection with necrotizing infection left foot status post third right toe amputation and debridement of infected tissue on the plantar surface, culture with group B strep and anaerobes, had repeat debridement, still infection present and as per podiatry/vascular going for left BKA today.  Currently on Unasyn as per infectious disease.  Group B Streptococcus sepsis POA also Prevotella bacteremia in anaerobic culture: Patient met sepsis criteria leukocytosis 11.8, fever 102 on admission, will need total 14 days of IV antibiotics but after amputation may need for 72 hours as per ID  Anemia from chronic disease: Monitor closely and transfuse to keep hemoglobin above 7 g in the periop setting- received 1 unit prbc 12/29 am. Recent Labs  Lab 11/12/20 0531  11/13/20 0412 11/14/20 0419 11/15/20 0554 11/16/20 0432  HGB 8.8* 7.4* 7.7* 7.0* 7.5*  HCT 27.2* 24.3* 25.2* 23.2* 24.5*   GERD continue PPI  B12 deficiency on B12 supplement  Peripheral neuropathy, continue home gabapentin  Diabetes mellitus on insulin pump at home, poorly controlled/nonketotic hyperosmolar hyperglycemia: Last hemoglobin A1c 12, off insulin drip, continue Lantus aspart and sliding scale insulin, DM coordinator following.  N.p.o. for surgery 12/29-we will continue to monitor blood sugar and adjust insulin Recent Labs  Lab 11/15/20 0814 11/15/20 1153 11/15/20 1636 11/15/20 2209 11/16/20 0732  GLUCAP 207* 219* 265* 293* 191*   CKD IIIa renal function at baseline continue to monitor closely and avoid nonessential nephrotoxic meds Recent Labs  Lab 11/12/20 0531 11/13/20 0412 11/14/20 0419 11/15/20 0554 11/16/20 0432  BUN _0 CREATININE 1.16 1.17 1.01 1.06 1.09   Hyperlipidemia continue statin  Low back pain history of right paracentral disc protrusion at L5-S1, CT L-spine no acute finding, continue pain control.  Headache, migraine type retro-orbital but no history of migraine, Imitrex 25 mg p.o. every 2 hours as needed consider subcommittees versus Fioricet if ineffective.  Hyponatremia likely pseudohyponatremia from hyperglycemia.  Monitor  Hypokalemia resolved  Morbid Obesity weight body mass index is 33.55 kg/m.  Will benefit with weight loss and healthy lifestyle.  Nutrition: Diet Order            Diet NPO time specified  Diet effective midnight                 DVT prophylaxis: Lovenox Code Status:   Code Status: Full Code  Family Communication: plan of care discussed with patient and his  wife at bedside.  Status is: Inpatient  Remains inpatient appropriate because:IV treatments appropriate due to intensity of illness or inability to take PO, Inpatient level of care appropriate due to severity of illness and ongoing surgical  need   Dispo: The patient is from: Home              Anticipated d/c is to: SNF              Anticipated d/c date is: 2 to 3 days once okay with ID/vascular              Patient currently is not medically stable to d/c.   Consultants:see note  Procedures:see note  Culture/Microbiology    Component Value Date/Time   SDES BLOOD LEFT ANTECUBITAL 11/11/2020 1306   SPECREQUEST  11/11/2020 1306    BOTTLES DRAWN AEROBIC AND ANAEROBIC Blood Culture adequate volume   CULT  11/11/2020 1306    NO GROWTH 5 DAYS Performed at [facility], [address] [place], [place] [address]    REPTSTATUS 11/16/2020 FINAL 11/11/2020 1306    Other culture-see note  Medications: Scheduled Meds: . sodium chloride   Intravenous Once  . acetaminophen  650 mg Oral Q6H  . atorvastatin  40 mg Oral Daily  . busPIRone  15 mg Oral BID  . Chlorhexidine Gluconate Cloth  6 each Topical Daily  . dicyclomine  10 mg Oral TID AC & HS  . enoxaparin (LOVENOX) injection  50 mg Subcutaneous Daily  . famotidine  20 mg Oral BID  . ferrous sulfate  325 mg Oral BID WC  . gabapentin  600 mg Oral QHS  . gabapentin  300 mg Oral BID  . insulin aspart  0-20 Units Subcutaneous TID AC & HS  . insulin aspart  7 Units Subcutaneous TID WC  . insulin glargine  77 Units Subcutaneous Daily  . lidocaine  1 patch Transdermal Q24H  . methocarbamol  750 mg Oral QID  . metoCLOPramide  10 mg Oral TID AC  . multivitamin with minerals  1 tablet Oral Daily  . pantoprazole  40 mg Oral Daily  . sertraline  100 mg Oral Daily  . vitamin B-12  500 mcg Oral Daily   Continuous Infusions: . sodium chloride Stopped (11/13/20 0515)  . sodium chloride    . ampicillin-sulbactam (UNASYN) IV 3 g (11/16/20 0831)  . lactated ringers 75 mL/hr at 11/16/20 0607    Antimicrobials: Anti-infectives (From admission, onward)   Start     Dose/Rate Route Frequency Ordered Stop   11/11/20 2222  vancomycin (VANCOCIN) powder  Status:   Discontinued          As needed 11/11/20 2222 11/11/20 2310   11/11/20 1600  Ampicillin-Sulbactam (UNASYN) 3 g in sodium chloride 0.9 % 100 mL IVPB        3 g 200 mL/hr over 30 Minutes Intravenous Every 6 hours 11/11/20 1508     11/11/20 0800  cefTRIAXone (ROCEPHIN) 2 g in sodium chloride 0.9 % 100 mL IVPB  Status:  Discontinued        2 g 200 mL/hr over 30 Minutes Intravenous Every 24 hours 11/11/20 0122 11/11/20 1506   11/09/20 0600  ceFEPIme (MAXIPIME) 2 g in sodium chloride 0.9 % 100 mL IVPB  Status:  Discontinued        2 g 200 mL/hr over 30 Minutes Intravenous Every 8 hours 11/09/20 0009 11/11/20 0122   11/09/20 0000  metroNIDAZOLE (FLAGYL) IVPB 500 mg  Status:  Discontinued        500 mg 100 mL/hr over 60 Minutes Intravenous Every 8 hours 11/08/20 1954 11/11/20 1507   11/08/20 2200  ceFEPIme (MAXIPIME) 2 g in sodium chloride 0.9 % 100 mL IVPB  Status:  Discontinued        2 g 200 mL/hr over 30 Minutes Intravenous Every 12 hours 11/08/20 1952 11/09/20 0009   11/08/20 1800  vancomycin (VANCOREADY) IVPB 1250 mg/250 mL  Status:  Discontinued        1,250 mg 166.7 mL/hr over 90 Minutes Intravenous Every 24 hours 11/07/20 1939 11/08/20 1258   11/08/20 1800  Ampicillin-Sulbactam (UNASYN) 3 g in sodium chloride 0.9 % 100 mL IVPB  Status:  Discontinued        3 g 200 mL/hr over 30 Minutes Intravenous Every 6 hours 11/08/20 1636 11/08/20 1951   11/08/20 1400  vancomycin (VANCOREADY) IVPB 750 mg/150 mL  Status:  Discontinued        750 mg 150 mL/hr over 60 Minutes Intravenous Every 12 hours 11/08/20 1258 11/10/20 1030   11/08/20 0800  piperacillin-tazobactam (ZOSYN) IVPB 3.375 g  Status:  Discontinued        3.375 g 12.5 mL/hr over 240 Minutes Intravenous Every 8 hours 11/07/20 1939 11/08/20 1636   11/07/20 2200  clindamycin (CLEOCIN) IVPB 600 mg  Status:  Discontinued        600 mg 100 mL/hr over 30 Minutes Intravenous Every 8 hours 11/07/20 1924 11/08/20 0834   11/07/20 1930  vancomycin  (VANCOCIN) IVPB 1000 mg/200 mL premix  Status:  Discontinued        1,000 mg 200 mL/hr over 60 Minutes Intravenous  Once 11/07/20 1924 11/07/20 2244   11/07/20 1930  piperacillin-tazobactam (ZOSYN) IVPB 3.375 g  Status:  Discontinued        3.375 g 100 mL/hr over 30 Minutes Intravenous  Once 11/07/20 1924 11/08/20 1636   11/07/20 1815  vancomycin (VANCOREADY) IVPB 2000 mg/400 mL  Status:  Discontinued        2,000 mg 200 mL/hr over 120 Minutes Intravenous STAT 11/07/20 1801 11/07/20 1802   11/07/20 1815  vancomycin (VANCOCIN) IVPB 1000 mg/200 mL premix  Status:  Discontinued        1,000 mg 200 mL/hr over 60 Minutes Intravenous STAT 11/07/20 1802 11/08/20 1258   11/07/20 1800  ceFEPIme (MAXIPIME) 2 g in sodium chloride 0.9 % 100 mL IVPB        2 g 200 mL/hr over 30 Minutes Intravenous STAT 11/07/20 1757 11/07/20 2303     Objective: Vitals: Today's Vitals   11/16/20 0636 11/16/20 0728 11/16/20 0808 11/16/20 0832  BP:  (!) 156/95 (!) 151/92   Pulse:  68 67   Resp:  15 18   Temp:  98.3 F (36.8 C) 98.3 F (36.8 C)   TempSrc:  Oral Oral   SpO2:  99% 98%   Weight:      Height:      PainSc: Asleep 8   7     Intake/Output Summary (Last 24 hours) at 11/16/2020 0918 Last data filed at 11/16/2020 0804 Gross per 24 hour  Intake 2493.5 ml  Output 2500 ml  Net -6.5 ml   Filed Weights   11/12/20 0406 11/14/20 0425 11/15/20 0344  Weight: 101.3 kg 106.7 kg 106.1 kg   Weight change:   Intake/Output from previous day: 12/28 0701 - 12/29 0700 In: 2073.5 [P.O.:120; I.V.:1753.5; IV Piggyback:200] Out: 2500 [Urine:2500] Intake/Output this shift:  Total I/O In: 420 [Blood:420] Out: -   Examination: General exam: AAOx3,NAD, weak appearing. HEENT:Oral mucosa moist, Ear/Nose WNL grossly,dentition normal. Respiratory system: bilaterally clear,no wheezing or crackles,no use of accessory muscle, non tender. Cardiovascular system: S1 & S2 +, regular, No JVD. Gastrointestinal system:  Abdomen soft, NT,ND, BS+. Nervous System:Alert, awake, moving extremities and grossly nonfocal Extremities: Foot in dressing did not remove, right lower extremity well perfused  Skin: No rashes,no icterus. MSK: Normal muscle bulk,tone, power  Data Reviewed: I have personally reviewed following labs and imaging studies CBC: Recent Labs  Lab 11/11/20 0522 11/12/20 0531 11/13/20 0412 11/14/20 0419 11/15/20 0554 11/16/20 0432  WBC 14.6* 17.7* 12.1* 15.3* 13.9* 11.6*  NEUTROABS 11.3* 15.8* 8.5*  --  10.4* 8.2*  HGB 8.2* 8.8* 7.4* 7.7* 7.0* 7.5*  HCT 25.7* 27.2* 24.3* 25.2* 23.2* 24.5*  MCV 84.3 84.5 87.1 87.5 87.5 88.1  PLT 279 330 320 346 382 557*   Basic Metabolic Panel: Recent Labs  Lab 11/12/20 0531 11/13/20 0412 11/14/20 0419 11/15/20 0554 11/16/20 0432  NA 135 137 134* 135 136  K 4.8 3.7 4.0 4.1 3.9  CL 103 106 104 102 101  CO2 _0 GLUCOSE 273* 279* 178* 242* 226*  BUN _1 CREATININE 1.16 1.17 1.01 1.06 1.09  CALCIUM 8.3* 7.7* 8.0* 8.3* 8.4*   GFR: Estimated Creatinine Clearance: 105.4 mL/min (by C-G formula based on SCr of 1.09 mg/dL). Liver Function Tests: No results for input(s): AST, ALT, ALKPHOS, BILITOT, PROT, ALBUMIN in the last 168 hours. No results for input(s): LIPASE, AMYLASE in the last 168 hours. No results for input(s): AMMONIA in the last 168 hours. Coagulation Profile: No results for input(s): INR, PROTIME in the last 168 hours. Cardiac Enzymes: No results for input(s): CKTOTAL, CKMB, CKMBINDEX, TROPONINI in the last 168 hours. BNP (last 3 results) No results for input(s): PROBNP in the last 8760 hours. HbA1C: No results for input(s): HGBA1C in the last 72 hours. CBG: Recent Labs  Lab 11/15/20 0814 11/15/20 1153 11/15/20 1636 11/15/20 2209 11/16/20 0732  GLUCAP 207* 219* 265* 293* 191*   Lipid Profile: No results for input(s): CHOL, HDL, LDLCALC, TRIG, CHOLHDL, LDLDIRECT in the last 72 hours. Thyroid Function  Tests: No results for input(s): TSH, T4TOTAL, FREET4, T3FREE, THYROIDAB in the last 72 hours. Anemia Panel: No results for input(s): VITAMINB12, FOLATE, FERRITIN, TIBC, IRON, RETICCTPCT in the last 72 hours. Sepsis Labs: No results for input(s): PROCALCITON, LATICACIDVEN in the last 168 hours.  Recent Results (from the past 240 hour(s))  Urine culture     Status: None   Collection Time: 11/07/20  5:49 PM   Specimen: In/Out Cath Urine  Result Value Ref Range Status   Specimen Description   Final    IN/OUT CATH URINE Performed at [facility], [address], [place], [place] [address]    Special Requests   Final    NONE Performed at [facility], [address], [place], [place] [address]    Culture   Final    NO GROWTH Performed at [facility], [place] [address] [place], [place] [address]    Report Status 11/08/2020 FINAL  Final  Blood Culture (routine x 2)     Status: Abnormal   Collection Time: 11/07/20  5:58 PM   Specimen: BLOOD  Result Value Ref Range Status   Specimen Description   Final    BLOOD RIGHT ANTECUBITAL Performed at [facility], [address], [place], [place] [address]    Special Requests   Final    BOTTLES DRAWN AEROBIC AND ANAEROBIC Blood Culture results may not be optimal due to an excessive volume of blood received in culture bottles Performed at [facility], [address], [place], [place] [address]    Culture  Setup Time   Final    GRAM POSITIVE COCCI IN BOTH AEROBIC AND ANAEROBIC BOTTLES CRITICAL VALUE NOTED.  VALUE IS CONSISTENT WITH PREVIOUSLY REPORTED AND CALLED VALUE.    Culture (A)  Final    GROUP B STREP(S.AGALACTIAE)ISOLATED SUSCEPTIBILITIES PERFORMED ON PREVIOUS CULTURE WITHIN THE LAST 5 DAYS. Performed at [facility], [place] [address] [place], [place] [address]    Report Status 11/11/2020 FINAL  Final  Blood Culture (routine x 2)     Status: Abnormal   Collection Time: 11/07/20   5:58 PM   Specimen: BLOOD  Result Value Ref Range Status   Specimen Description   Final    BLOOD BLOOD LEFT FOREARM Performed at [facility], [address] [place], [place] [address]    Special Requests   Final    BOTTLES DRAWN AEROBIC AND ANAEROBIC Blood Culture adequate volume Performed at [facility], [address] [place], [place] [address]    Culture  Setup Time   Final    Organism ID to follow GRAM POSITIVE COCCI IN BOTH AEROBIC AND ANAEROBIC BOTTLES CRITICAL RESULT CALLED TO, READ BACK BY AND VERIFIED WITH: SUSAN WATSON AT 9373 12/21/[address] Performed at [facility], [address] [place], [place] [address]    Culture GROUP B STREP(S.AGALACTIAE)ISOLATED (A)  Final   Report Status 11/10/2020 FINAL  Final   Organism ID, Bacteria GROUP B STREP(S.AGALACTIAE)ISOLATED  Final      Susceptibility   Group b strep(s.agalactiae)isolated - MIC*    CLINDAMYCIN >=1 RESISTANT Resistant     AMPICILLIN <=0.25 SENSITIVE Sensitive     ERYTHROMYCIN 2 RESISTANT Resistant     VANCOMYCIN 0.5 SENSITIVE Sensitive     CEFTRIAXONE <=0.12 SENSITIVE Sensitive     LEVOFLOXACIN 1 SENSITIVE Sensitive     PENICILLIN Value in next row Sensitive      SENSITIVE<=0.06    * GROUP B STREP(S.AGALACTIAE)ISOLATED  Resp Panel by RT-PCR (Flu A&B, Covid) Nasopharyngeal Swab     Status: None   Collection Time: 11/07/20  5:58 PM   Specimen: Nasopharyngeal Swab; Nasopharyngeal(NP) swabs in vial transport medium  Result Value Ref Range Status   SARS Coronavirus 2 by RT PCR NEGATIVE NEGATIVE Final    Comment: (NOTE) SARS-CoV-2 target nucleic acids are NOT DETECTED.  The SARS-CoV-2 RNA is generally detectable in upper respiratory specimens during the acute phase of infection. The lowest concentration of SARS-CoV-2 viral copies this assay can detect is 138 copies/mL. A negative result does not preclude SARS-Cov-2 infection and should not be used as the sole basis for treatment  or other patient management decisions. A negative result may occur with  improper specimen collection/handling, submission of specimen other than nasopharyngeal swab, presence of viral mutation(s) within the areas targeted by this assay, and inadequate number of viral copies(<138 copies/mL). A negative result must be combined with clinical observations, patient history, and epidemiological information. The expected result is Negative.  Fact Sheet for Patients:  EntrepreneurPulse.com.au  Fact Sheet for Healthcare Providers:  IncredibleEmployment.be  This test is no t yet approved or cleared by the [place] FDA and  has been authorized for detection and/or  diagnosis of SARS-CoV-2 by FDA under an Emergency Use Authorization (EUA). This EUA will remain  in effect (meaning this test can be used) for the duration of the COVID-19 declaration under Section 564(b)(1) of the Act, 21 U.S.C.section 360bbb-3(b)(1), unless the authorization is terminated  or revoked sooner.       Influenza A by PCR NEGATIVE NEGATIVE Final   Influenza B by PCR NEGATIVE NEGATIVE Final    Comment: (NOTE) The Xpert Xpress SARS-CoV-2/FLU/RSV plus assay is intended as an aid in the diagnosis of influenza from Nasopharyngeal swab specimens and should not be used as a sole basis for treatment. Nasal washings and aspirates are unacceptable for Xpert Xpress SARS-CoV-2/FLU/RSV testing.  Fact Sheet for Patients: EntrepreneurPulse.com.au  Fact Sheet for Healthcare Providers: IncredibleEmployment.be  This test is not yet approved or cleared by the [place] FDA and has been authorized for detection and/or diagnosis of SARS-CoV-2 by FDA under an Emergency Use Authorization (EUA). This EUA will remain in effect (meaning this test can be used) for the duration of the COVID-19 declaration under Section 564(b)(1) of the Act, 21 U.S.C. section  360bbb-3(b)(1), unless the authorization is terminated or revoked.  Performed at [facility], [address] [place], [place] [address]   Blood Culture ID Panel (Reflexed)     Status: Abnormal   Collection Time: 11/07/20  5:58 PM  Result Value Ref Range Status   Enterococcus faecalis NOT DETECTED NOT DETECTED Final   Enterococcus Faecium NOT DETECTED NOT DETECTED Final   Listeria monocytogenes NOT DETECTED NOT DETECTED Final   Staphylococcus species NOT DETECTED NOT DETECTED Final   Staphylococcus aureus (BCID) NOT DETECTED NOT DETECTED Final   Staphylococcus epidermidis NOT DETECTED NOT DETECTED Final   Staphylococcus lugdunensis NOT DETECTED NOT DETECTED Final   Streptococcus species DETECTED (A) NOT DETECTED Final    Comment: CRITICAL RESULT CALLED TO, READ BACK BY AND VERIFIED WITH:  SUSAN WATSON AT 1307 11/08/20 SDR    Streptococcus agalactiae DETECTED (A) NOT DETECTED Final    Comment: CRITICAL RESULT CALLED TO, READ BACK BY AND VERIFIED WITH:  SUSAN WATSON AT 1307 11/08/20 SDR    Streptococcus pneumoniae NOT DETECTED NOT DETECTED Final   Streptococcus pyogenes NOT DETECTED NOT DETECTED Final   A.calcoaceticus-baumannii NOT DETECTED NOT DETECTED Final   Bacteroides fragilis NOT DETECTED NOT DETECTED Final   Enterobacterales NOT DETECTED NOT DETECTED Final   Enterobacter cloacae complex NOT DETECTED NOT DETECTED Final   Escherichia coli NOT DETECTED NOT DETECTED Final   Klebsiella aerogenes NOT DETECTED NOT DETECTED Final   Klebsiella oxytoca NOT DETECTED NOT DETECTED Final   Klebsiella pneumoniae NOT DETECTED NOT DETECTED Final   Proteus species NOT DETECTED NOT DETECTED Final   Salmonella species NOT DETECTED NOT DETECTED Final   Serratia marcescens NOT DETECTED NOT DETECTED Final   Haemophilus influenzae NOT DETECTED NOT DETECTED Final   Neisseria meningitidis NOT DETECTED NOT DETECTED Final   Pseudomonas aeruginosa NOT DETECTED NOT DETECTED Final    Stenotrophomonas maltophilia NOT DETECTED NOT DETECTED Final   Candida albicans NOT DETECTED NOT DETECTED Final   Candida auris NOT DETECTED NOT DETECTED Final   Candida glabrata NOT DETECTED NOT DETECTED Final   Candida krusei NOT DETECTED NOT DETECTED Final   Candida parapsilosis NOT DETECTED NOT DETECTED Final   Candida tropicalis NOT DETECTED NOT DETECTED Final   Cryptococcus neoformans/gattii NOT DETECTED NOT DETECTED Final    Comment: Performed at [facility], [address] [place], [place] [address]  MRSA  PCR Screening     Status: None   Collection Time: 11/07/20  8:03 PM   Specimen: Nasopharyngeal  Result Value Ref Range Status   MRSA by PCR NEGATIVE NEGATIVE Final    Comment:        The GeneXpert MRSA Assay (FDA approved for NASAL specimens only), is one component of a comprehensive MRSA colonization surveillance program. It is not intended to diagnose MRSA infection nor to guide or monitor treatment for MRSA infections. Performed at [facility], [address] [place], [place] [address]   Anaerobic culture     Status: Abnormal   Collection Time: 11/07/20  8:13 PM   Specimen: PATH Other  Result Value Ref Range Status   Specimen Description   Final    TOE LEFT Performed at [facility], [address] [place], [place] [address]    Special Requests BONE  Final   Gram Stain   Final    RARE WBC PRESENT,BOTH PMN AND MONONUCLEAR MODERATE GRAM POSITIVE COCCI IN PAIRS IN CLUSTERS    Culture (A)  Final    PREVOTELLA DISIENS BETA LACTAMASE POSITIVE Performed at [facility], [place] [address] [place], [place] [address]    Report Status 11/12/2020 FINAL  Final  Aerobic/Anaerobic Culture (surgical/deep wound)     Status: None   Collection Time: 11/07/20  8:13 PM   Specimen: PATH Other; Abscess  Result Value Ref Range Status   Specimen Description   Final    ABSCESS Performed at [facility], [address],  [place], [place] [address]    Special Requests   Final    LEFT FOOT Performed at [facility], [place], [place] [address]    Gram Stain   Final    RARE WBC PRESENT,BOTH PMN AND MONONUCLEAR ABUNDANT GRAM POSITIVE COCCI IN PAIRS IN CLUSTERS    Culture   Final    FEW GROUP B STREP(S.AGALACTIAE)ISOLATED TESTING AGAINST S. AGALACTIAE NOT ROUTINELY PERFORMED DUE TO PREDICTABILITY OF AMP/PEN/VAN SUSCEPTIBILITY. FEW PREVOTELLA DISIENS BETA LACTAMASE POSITIVE Performed at [facility], [place] [address], [place], [place] [address]    Report Status 11/12/2020 FINAL  Final  Aerobic Culture (superficial specimen)     Status: None   Collection Time: 11/07/20  8:13 PM   Specimen: PATH Other  Result Value Ref Range Status   Specimen Description   Final    ABSCESS Performed at [facility], [address] [place], [place] [address]    Special Requests LEFT FOOT BONE  Final   Gram Stain   Final    NO WBC SEEN FEW GRAM POSITIVE COCCI IN PAIRS IN CLUSTERS    Culture   Final    MODERATE GROUP B STREP(S.AGALACTIAE)ISOLATED TESTING AGAINST S. AGALACTIAE NOT ROUTINELY PERFORMED DUE TO PREDICTABILITY OF AMP/PEN/VAN SUSCEPTIBILITY. Performed at [facility], [place] [address] [place], [place] [address]    Report Status 11/10/2020 FINAL  Final  CULTURE, BLOOD (ROUTINE X 2) w Reflex to ID Panel     Status: None   Collection Time: 11/11/20 12:48 PM   Specimen: BLOOD  Result Value Ref Range Status   Specimen Description BLOOD BLOOD RIGHT HAND  Final   Special Requests   Final    BOTTLES DRAWN AEROBIC AND ANAEROBIC Blood Culture adequate volume   Culture   Final    NO GROWTH 5 DAYS Performed at [facility], [address] [place],  [address]  Report Status 11/16/2020 FINAL  Final  CULTURE, BLOOD (ROUTINE X 2) w Reflex to ID Panel     Status: None   Collection Time: 11/11/20  1:06 PM   Specimen: BLOOD  Result Value Ref Range Status    Specimen Description BLOOD LEFT ANTECUBITAL  Final   Special Requests   Final    BOTTLES DRAWN AEROBIC AND ANAEROBIC Blood Culture adequate volume   Culture   Final    NO GROWTH 5 DAYS Performed at [facility], [address], [place], [place] [address]    Report Status 11/16/2020 FINAL  Final     Radiology Studies: No results found.   LOS: 9 days   Antonieta Pert, MD [facility]  11/16/2020, 9:18 AM

## 2020-11-16 NOTE — Progress Notes (Addendum)
Ok to go to floor per dr Noralyn Pick with 24 hour pulse ox monitor.  Offered to change sheets for pt since he had been diaphoretic and they are wet, pt states "no I would like to wait until in my room".

## 2020-11-16 NOTE — Interval H&P Note (Signed)
History and Physical Interval Note:  11/16/2020 10:54 AM  Richard Mendoza  has presented today for surgery, with the diagnosis of PVD.  The various methods of treatment have been discussed with the patient and family. After consideration of risks, benefits and other options for treatment, the patient has consented to  Procedure(s): AMPUTATION BELOW KNEE (Left) as a surgical intervention.  The patient's history has been reviewed, patient examined, no change in status, stable for surgery.  I have reviewed the patient's chart and labs.  Questions were answered to the patient's satisfaction.     Festus Barren

## 2020-11-16 NOTE — Anesthesia Preprocedure Evaluation (Signed)
Anesthesia Evaluation  Patient identified by MRN, date of birth, ID band Patient awake    Reviewed: Allergy & Precautions, H&P , NPO status , Patient's Chart, lab work & pertinent test results  History of Anesthesia Complications Negative for: history of anesthetic complications  Airway Mallampati: III  TM Distance: >3 FB Neck ROM: full    Dental  (+) Edentulous Upper, Edentulous Lower   Pulmonary neg shortness of breath, sleep apnea , former smoker,    Pulmonary exam normal breath sounds clear to auscultation       Cardiovascular Exercise Tolerance: Good hypertension, (-) angina(-) Past MI and (-) DOE Normal cardiovascular exam Rhythm:Regular Rate:Normal     Neuro/Psych  Neuromuscular disease negative psych ROS   GI/Hepatic Neg liver ROS, GERD  Medicated and Controlled,  Endo/Other  diabetes, Poorly Controlled, Type 2  Renal/GU      Musculoskeletal  (+) Arthritis , Osteoarthritis,    Abdominal   Peds  Hematology  (+) anemia ,   Anesthesia Other Findings Gas gangrene   Past Medical History: 03/17/2009: DIABETES MELLITUS, TYPE II, UNCONTROLLED 12/08/2008: DM 03/17/2009: HYPERLIPIDEMIA 12/08/2008: HYPERTENSION 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  Past Surgical History: 09/09/2017: ANTERIOR CERVICAL DECOMP/DISCECTOMY FUSION; N/A     Comment:  Procedure: ANTERIOR CERVICAL DECOMPRESSION/DISCECTOMY               FUSION CERVICAL 6- CERVICAL 7;  Surgeon: Ashok Pall,               MD;  Location: [place];  Service: Neurosurgery;                Laterality: N/A;  ANTERIOR CERVICAL               DECOMPRESSION/DISCECTOMY FUSION CERVICAL 6- CERVICAL 7 No date: APPENDECTOMY 10/03/2017: I & D EXTREMITY; Right     Comment:  Procedure: IRRIGATION AND DEBRIDEMENT RIGHT WRIST;                Surgeon: Leanora Cover, MD;  Location: [place];  Service:               Orthopedics;  Laterality: Right; 11/26/2018: I & D EXTREMITY; Right      Comment:  Procedure: IRRIGATION AND DEBRIDEMENT FASCIA ON RIGHT               FOOT;  Surgeon: Samara Deist, DPM;  Location: [facility];              Service: Podiatry;  Laterality: Right; 03/06/2019: INCISION AND DRAINAGE; Right     Comment:  Procedure: INCISION AND DRAINAGE RIGHT FOOT, WITH 4th               RAY AMPUTATION;  Surgeon: Samara Deist, DPM;  Location:              [facility];  Service: Podiatry;  Laterality: Right; 05/15/2019: METATARSAL HEAD EXCISION; Right     Comment:  Procedure: OSTECTOMY;MET HEAD 5;  Surgeon: Samara Deist, DPM;  Location: [facility];  Service: Podiatry;                Laterality: Right; No date: osteomylitis No date: ROTATOR CUFF REPAIR; Left  BMI    Body Mass Index: 32.28 kg/m      Reproductive/Obstetrics negative OB ROS  Anesthesia Physical  Anesthesia Plan  ASA: III  Anesthesia Plan: General   Post-op Pain Management:    Induction: Intravenous  PONV Risk Score and Plan: 2 and Ondansetron, Dexamethasone, Midazolam and Treatment may vary due to age or medical condition  Airway Management Planned: Oral ETT  Additional Equipment: None  Intra-op Plan:   Post-operative Plan: Extubation in OR  Informed Consent: I have reviewed the patients History and Physical, chart, labs and discussed the procedure including the risks, benefits and alternatives for the proposed anesthesia with the patient or authorized representative who has indicated his/her understanding and acceptance.     Dental Advisory Given  Plan Discussed with: Anesthesiologist, CRNA and Surgeon  Anesthesia Plan Comments: (Discussed risks of anesthesia with patient, including PONV, sore throat, lip/dental damage. Rare risks discussed as well, such as cardiorespiratory and neurological sequelae. Patient understands.)        Anesthesia Quick Evaluation

## 2020-11-16 NOTE — Progress Notes (Signed)
Inpatient Diabetes Program Recommendations  AACE/ADA: New Consensus Statement on Inpatient Glycemic Control   Target Ranges:  Prepandial:   less than 140 mg/dL      Peak postprandial:   less than 180 mg/dL (1-2 hours)      Critically ill patients:  140 - 180 mg/dL   Results for ANGELLO, CHIEN (MRN [id number]) as of 11/16/2020 07:38  Ref. Range 11/15/2020 08:14 11/15/2020 11:53 11/15/2020 16:36 11/15/2020 22:09 11/16/2020 07:32  Glucose-Capillary Latest Ref Range: 70 - 99 mg/dL 702 (H) 637 (H) 858 (H) 293 (H) 191 (H)   Review of Glycemic Control  Current orders for Inpatient glycemic control: Lantus 77 units daily, Novolog 7 units TID with meals, Novolog 0-20 units AC&HS  Inpatient Diabetes Program Recommendations:    Insulin: Please consider increasing Lantus to 79 units daily.  Thanks, Orlando Penner, RN, MSN, CDE Diabetes Coordinator Inpatient Diabetes Program [phone] (Team Pager from 8am to 5pm)

## 2020-11-17 ENCOUNTER — Encounter: Payer: Self-pay | Admitting: Vascular Surgery

## 2020-11-17 DIAGNOSIS — L089 Local infection of the skin and subcutaneous tissue, unspecified: Secondary | ICD-10-CM | POA: Diagnosis not present

## 2020-11-17 DIAGNOSIS — E11628 Type 2 diabetes mellitus with other skin complications: Secondary | ICD-10-CM | POA: Diagnosis not present

## 2020-11-17 DIAGNOSIS — M726 Necrotizing fasciitis: Secondary | ICD-10-CM | POA: Diagnosis not present

## 2020-11-17 LAB — CBC
HCT: 23.7 % — ABNORMAL LOW (ref 39.0–52.0)
Hemoglobin: 7.5 g/dL — ABNORMAL LOW (ref 13.0–17.0)
MCH: 27.3 pg (ref 26.0–34.0)
MCHC: 31.6 g/dL (ref 30.0–36.0)
MCV: 86.2 fL (ref 80.0–100.0)
Platelets: 442 10*3/uL — ABNORMAL HIGH (ref 150–400)
RBC: 2.75 MIL/uL — ABNORMAL LOW (ref 4.22–5.81)
RDW: 15.7 % — ABNORMAL HIGH (ref 11.5–15.5)
WBC: 15.3 10*3/uL — ABNORMAL HIGH (ref 4.0–10.5)
nRBC: 0 % (ref 0.0–0.2)

## 2020-11-17 LAB — BASIC METABOLIC PANEL
Anion gap: 7 (ref 5–15)
BUN: 18 mg/dL (ref 6–20)
CO2: 25 mmol/L (ref 22–32)
Calcium: 8.2 mg/dL — ABNORMAL LOW (ref 8.9–10.3)
Chloride: 100 mmol/L (ref 98–111)
Creatinine, Ser: 1.03 mg/dL (ref 0.61–1.24)
GFR, Estimated: >60 mL/min (ref >60)
Glucose, Bld: 316 mg/dL — ABNORMAL HIGH (ref 70–99)
Potassium: 4.1 mmol/L (ref 3.5–5.1)
Sodium: 132 mmol/L — ABNORMAL LOW (ref 135–145)

## 2020-11-17 LAB — SURGICAL PATHOLOGY

## 2020-11-17 LAB — GLUCOSE, CAPILLARY
Glucose-Capillary: 230 mg/dL — ABNORMAL HIGH (ref 70–99)
Glucose-Capillary: 238 mg/dL — ABNORMAL HIGH (ref 70–99)
Glucose-Capillary: 269 mg/dL — ABNORMAL HIGH (ref 70–99)
Glucose-Capillary: 307 mg/dL — ABNORMAL HIGH (ref 70–99)
Glucose-Capillary: 461 mg/dL — ABNORMAL HIGH (ref 70–99)

## 2020-11-17 MED ORDER — LISINOPRIL 5 MG PO TABS
5.0000 mg | ORAL_TABLET | Freq: Every day | ORAL | Status: DC
  Administered 2020-11-17: 5 mg via ORAL
  Filled 2020-11-17: qty 1

## 2020-11-17 MED ORDER — OXYCODONE-ACETAMINOPHEN 7.5-325 MG PO TABS
1.0000 | ORAL_TABLET | Freq: Four times a day (QID) | ORAL | Status: DC | PRN
  Administered 2020-11-17 – 2020-11-18 (×2): 1 via ORAL
  Filled 2020-11-17 (×2): qty 1

## 2020-11-17 MED ORDER — INSULIN GLARGINE 100 UNIT/ML ~~LOC~~ SOLN
80.0000 [IU] | Freq: Every day | SUBCUTANEOUS | Status: DC
  Administered 2020-11-17 – 2020-11-18 (×2): 80 [IU] via SUBCUTANEOUS
  Filled 2020-11-17 (×2): qty 0.8

## 2020-11-17 MED ORDER — INSULIN ASPART 100 UNIT/ML ~~LOC~~ SOLN
22.0000 [IU] | Freq: Once | SUBCUTANEOUS | Status: AC
  Administered 2020-11-17: 22 [IU] via SUBCUTANEOUS
  Filled 2020-11-17: qty 1

## 2020-11-17 NOTE — Progress Notes (Signed)
PROGRESS NOTE    Richard Mendoza  EQA:[id number] DOB: September 25, 1976 DOA: 11/07/2020 PCP: Tracie Harrier, MD   Brief Narrative: 44 year old male with type II uncontrolled diabetes on insulin pump, hypertension, dyslipidemia presents to the ED with left foot infection acute, worsening, was referred by podiatrist.  Patient endorsed uncontrolled blood sugar level, also nausea, fever and chills at home.  He was on doxycycline by Dr. Vickki Muff but infection was not improving. He in the ED he was febrile and lab with a leukocytosis, COVID-19 negative, left foot x-ray diffuse subcutaneous gas along the plantar foot extending to the third toe concerning for necrotizing fasciitis, patient was seen by Dr. Caryl Comes, patient had undergone left foot debridement and drainage of abscess by podiatry but is still with WBC count elevated with excessive purulence and seen by vascular surgery and eventually left foot is not salvageable and has been advised left BKA for 12/29 Is followed by infectious disease. Subjective: Seen/examined this morning, Noted to have some bleeding/soaking from left BKA site.\ His pain is controlled. 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  Appreciate infectious disease input, continue Unasyn.  Leukocytosis noted postop reactive likely.  Significant oozing from the skin is on the lateral portion of the wound so dressing was taken down and new tight Ace wrap applied by vascular.  PT OT can start today.   Group B Streptococcus sepsis POA also Prevotella bacteremia in anaerobic culture: Patient met  sepsis criteria leukocytosis 11.8, fever 102 on admission, will need total 14 days of IV antibiotics but after amputation as per ID 72 hours of iv antibiotics will suffice  Anemia from chronic disease:received 1 unit prbc 12/29 am.  Monitor closely given risk of anemia with postop blood loss. Recent Labs  Lab 11/13/20 0412 11/14/20 0419 11/15/20 0554 11/16/20 0432 11/17/20 0400  HGB 7.4* 7.7* 7.0* 7.5* 7.5*  HCT 24.3* 25.2* 23.2* 24.5* 23.7*   GERD continue PPI  B12 deficiency continue with B12 supplement  Peripheral neuropathy, continue Neurontin.  Diabetes mellitus on insulin pump at home, poorly controlled/nonketotic hyperosmolar hyperglycemia: Last hemoglobin A1c 12, off insulin drip.  Blood sugar went up to  12/29,apparently patient did not receive  lantus 12/29  ( seems to have been held for OR/Npo status) and also dose of Decadron. will Increase Lantus to 80 units giving urgently this morning, cont ., continue 7 units premeal insulin sliding scale insulin.  DM coordinator following.  Recent Labs  Lab 11/16/20 1328 11/16/20 1700 11/16/20 2117 11/17/20 0009 11/17/20 0737  GLUCAP 169* 232* 452* 461* 307*   CKD IIIa renal function is stable at baseline at 1.0.    Hyperlipidemia continue statin  Low back pain history of right paracentral disc protrusion at L5-S1, CT L-spine no acute finding, continue pain control.  Headache, migraine type retro-orbital but no history of migraine, Imitrex 25 mg p.o. every 2 hours as needed consider subcommittees versus Fioricet if ineffective.  Hyponatremia likely pseudohyponatremia from hyperglycemia.  Monitor  Hypokalemia resolved  Morbid Obesity weight body mass index is 33.55 kg/m.  Will benefit with weight loss.  Nutrition: Diet Order  Diet Carb Modified Fluid consistency: Thin; Room service appropriate? Yes  Diet effective now                 DVT prophylaxis: Lovenox Code Status:   Code Status: Full Code  Family  Communication: plan of care discussed with patient and his wife at bedside.  Status is: Inpatient  Remains inpatient appropriate because:IV treatments appropriate due to intensity of illness or inability to take PO, Inpatient level of care appropriate due to severity of illness and ongoing surgical need   Dispo: The patient is from: Home              Anticipated d/c is to: SNF              Anticipated d/c date is: 3 days once okay with ID/vascular              Patient currently is not medically stable to d/c.   Consultants:see note  Procedures:see note  Culture/Microbiology    Component Value Date/Time   SDES BLOOD LEFT ANTECUBITAL 11/11/2020 1306   SPECREQUEST  11/11/2020 1306    BOTTLES DRAWN AEROBIC AND ANAEROBIC Blood Culture adequate volume   CULT  11/11/2020 1306    NO GROWTH 5 DAYS Performed at [facility], [address] [place], [place] [address]    REPTSTATUS 11/16/2020 FINAL 11/11/2020 1306    Other culture-see note  Medications: Scheduled Meds: . sodium chloride   Intravenous Once  . acetaminophen  650 mg Oral Q6H  . atorvastatin  40 mg Oral Daily  . busPIRone  15 mg Oral BID  . Chlorhexidine Gluconate Cloth  6 each Topical Daily  . dicyclomine  10 mg Oral TID AC & HS  . enoxaparin (LOVENOX) injection  50 mg Subcutaneous Daily  . famotidine  20 mg Oral BID  . ferrous sulfate  325 mg Oral BID WC  . gabapentin  600 mg Oral QHS  . gabapentin  300 mg Oral BID  . insulin aspart  0-20 Units Subcutaneous TID AC & HS  . insulin aspart  7 Units Subcutaneous TID WC  . insulin glargine  80 Units Subcutaneous Daily  . lidocaine  1 patch Transdermal Q24H  . methocarbamol  750 mg Oral QID  . metoCLOPramide  10 mg Oral TID AC  . multivitamin with minerals  1 tablet Oral Daily  . pantoprazole  40 mg Oral Daily  . sertraline  100 mg Oral Daily  . vitamin B-12  500 mcg Oral Daily   Continuous Infusions: . sodium chloride Stopped (11/13/20 0515)  .  ampicillin-sulbactam (UNASYN) IV 3 g (11/17/20 0543)  . lactated ringers 75 mL/hr at 11/16/20 2201    Antimicrobials: Anti-infectives (From admission, onward)   Start     Dose/Rate Route Frequency Ordered Stop   11/11/20 2222  vancomycin (VANCOCIN) powder  Status:  Discontinued          As needed 11/11/20 2222 11/11/20 2310   11/11/20 1600  Ampicillin-Sulbactam (UNASYN) 3 g in sodium chloride 0.9 % 100 mL IVPB        3 g 200 mL/hr over 30 Minutes Intravenous Every 6 hours 11/11/20 1508     11/11/20 0800  cefTRIAXone (ROCEPHIN) 2 g in sodium chloride 0.9 % 100 mL IVPB  Status:  Discontinued        2 g 200 mL/hr over 30 Minutes Intravenous Every 24 hours 11/11/20 0122 11/11/20 1506   11/09/20 0600  ceFEPIme (MAXIPIME) 2 g in  sodium chloride 0.9 % 100 mL IVPB  Status:  Discontinued        2 g 200 mL/hr over 30 Minutes Intravenous Every 8 hours 11/09/20 0009 11/11/20 0122   11/09/20 0000  metroNIDAZOLE (FLAGYL) IVPB 500 mg  Status:  Discontinued        500 mg 100 mL/hr over 60 Minutes Intravenous Every 8 hours 11/08/20 1954 11/11/20 1507   11/08/20 2200  ceFEPIme (MAXIPIME) 2 g in sodium chloride 0.9 % 100 mL IVPB  Status:  Discontinued        2 g 200 mL/hr over 30 Minutes Intravenous Every 12 hours 11/08/20 1952 11/09/20 0009   11/08/20 1800  vancomycin (VANCOREADY) IVPB 1250 mg/250 mL  Status:  Discontinued        1,250 mg 166.7 mL/hr over 90 Minutes Intravenous Every 24 hours 11/07/20 1939 11/08/20 1258   11/08/20 1800  Ampicillin-Sulbactam (UNASYN) 3 g in sodium chloride 0.9 % 100 mL IVPB  Status:  Discontinued        3 g 200 mL/hr over 30 Minutes Intravenous Every 6 hours 11/08/20 1636 11/08/20 1951   11/08/20 1400  vancomycin (VANCOREADY) IVPB 750 mg/150 mL  Status:  Discontinued        750 mg 150 mL/hr over 60 Minutes Intravenous Every 12 hours 11/08/20 1258 11/10/20 1030   11/08/20 0800  piperacillin-tazobactam (ZOSYN) IVPB 3.375 g  Status:  Discontinued        3.375 g 12.5  mL/hr over 240 Minutes Intravenous Every 8 hours 11/07/20 1939 11/08/20 1636   11/07/20 2200  clindamycin (CLEOCIN) IVPB 600 mg  Status:  Discontinued        600 mg 100 mL/hr over 30 Minutes Intravenous Every 8 hours 11/07/20 1924 11/08/20 0834   11/07/20 1930  vancomycin (VANCOCIN) IVPB 1000 mg/200 mL premix  Status:  Discontinued        1,000 mg 200 mL/hr over 60 Minutes Intravenous  Once 11/07/20 1924 11/07/20 2244   11/07/20 1930  piperacillin-tazobactam (ZOSYN) IVPB 3.375 g  Status:  Discontinued        3.375 g 100 mL/hr over 30 Minutes Intravenous  Once 11/07/20 1924 11/08/20 1636   11/07/20 1815  vancomycin (VANCOREADY) IVPB 2000 mg/400 mL  Status:  Discontinued        2,000 mg 200 mL/hr over 120 Minutes Intravenous STAT 11/07/20 1801 11/07/20 1802   11/07/20 1815  vancomycin (VANCOCIN) IVPB 1000 mg/200 mL premix  Status:  Discontinued        1,000 mg 200 mL/hr over 60 Minutes Intravenous STAT 11/07/20 1802 11/08/20 1258   11/07/20 1800  ceFEPIme (MAXIPIME) 2 g in sodium chloride 0.9 % 100 mL IVPB        2 g 200 mL/hr over 30 Minutes Intravenous STAT 11/07/20 1757 11/07/20 2303     Objective: Vitals: Today's Vitals   11/17/20 0538 11/17/20 0608 11/17/20 0735 11/17/20 0742  BP:   (!) 163/89   Pulse:   84   Resp:   18   Temp:   98.2 F (36.8 C)   TempSrc:      SpO2:   100%   Weight:      Height:      PainSc: 9  Asleep  9     Intake/Output Summary (Last 24 hours) at 11/17/2020 0747 Last data filed at 11/17/2020 0700 Gross per 24 hour  Intake 2110 ml  Output 3200 ml  Net -1090 ml   Filed Weights   11/12/20  0406 11/14/20 0425 11/15/20 0344  Weight: 101.3 kg 106.7 kg 106.1 kg   Weight change:   Intake/Output from previous day: 12/29 0701 - 12/30 0700 In: 2110 [P.O.:240; I.V.:1250; Blood:420; IV Piggyback:200] Out: 3200 [Urine:3050; Blood:150] Intake/Output this shift: No intake/output data recorded.  Examination: General exam: AAOx3 , NAD, weak  appearing. HEENT:Oral mucosa moist, Ear/Nose WNL grossly, dentition normal. Respiratory system: bilaterally clear,no wheezing or crackles,no use of accessory muscle Cardiovascular system: S1 & S2 +, No JVD,. Gastrointestinal system: Abdomen soft, NT,ND, BS+ Nervous System:Alert, awake, moving extremities and grossly nonfocal Extremities: Lt bka stump-with oozing , rt distal peripheral pulses palpable.  Skin: No rashes,no icterus. MSK: Normal muscle bulk,tone, power Dr Christena Deem Data Reviewed: I have personally reviewed following labs and imaging studies CBC: Recent Labs  Lab 11/11/20 0522 11/12/20 0531 11/13/20 0412 11/14/20 0419 11/15/20 0554 11/16/20 0432 11/17/20 0400  WBC 14.6* 17.7* 12.1* 15.3* 13.9* 11.6* 15.3*  NEUTROABS 11.3* 15.8* 8.5*  --  10.4* 8.2*  --   HGB 8.2* 8.8* 7.4* 7.7* 7.0* 7.5* 7.5*  HCT 25.7* 27.2* 24.3* 25.2* 23.2* 24.5* 23.7*  MCV 84.3 84.5 87.1 87.5 87.5 88.1 86.2  PLT 279 330 320 346 382 408* 867*   Basic Metabolic Panel: Recent Labs  Lab 11/13/20 0412 11/14/20 0419 11/15/20 0554 11/16/20 0432 11/16/20 2200 11/17/20 0400  NA 137 134* 135 136  --  132*  K 3.7 4.0 4.1 3.9  --  4.1  CL 106 104 102 101  --  100  CO2 _0 --  25  GLUCOSE 279* 178* 242* 226* 552* 316*  BUN _1 --  18  CREATININE 1.17 1.01 1.06 1.09  --  1.03  CALCIUM 7.7* 8.0* 8.3* 8.4*  --  8.2*   GFR: Estimated Creatinine Clearance: 111.6 mL/min (by C-G formula based on SCr of 1.03 mg/dL). Liver Function Tests: No results for input(s): AST, ALT, ALKPHOS, BILITOT, PROT, ALBUMIN in the last 168 hours. No results for input(s): LIPASE, AMYLASE in the last 168 hours. No results for input(s): AMMONIA in the last 168 hours. Coagulation Profile: No results for input(s): INR, PROTIME in the last 168 hours. Cardiac Enzymes: No results for input(s): CKTOTAL, CKMB, CKMBINDEX, TROPONINI in the last 168 hours. BNP (last 3 results) No results for input(s): PROBNP in  the last 8760 hours. HbA1C: No results for input(s): HGBA1C in the last 72 hours. CBG: Recent Labs  Lab 11/16/20 1328 11/16/20 1700 11/16/20 2117 11/17/20 0009 11/17/20 0737  GLUCAP 169* 232* 452* 461* 307*   Lipid Profile: No results for input(s): CHOL, HDL, LDLCALC, TRIG, CHOLHDL, LDLDIRECT in the last 72 hours. Thyroid Function Tests: No results for input(s): TSH, T4TOTAL, FREET4, T3FREE, THYROIDAB in the last 72 hours. Anemia Panel: No results for input(s): VITAMINB12, FOLATE, FERRITIN, TIBC, IRON, RETICCTPCT in the last 72 hours. Sepsis Labs: No results for input(s): PROCALCITON, LATICACIDVEN in the last 168 hours.  Recent Results (from the past 240 hour(s))  Urine culture     Status: None   Collection Time: 11/07/20  5:49 PM   Specimen: In/Out Cath Urine  Result Value Ref Range Status   Specimen Description   Final    IN/OUT CATH URINE Performed at [facility], [address], [place], [place] [address]    Special Requests   Final    NONE Performed at [facility], [address] [place], [place] [address]    Culture   Final  NO GROWTH Performed at [facility], [place] [address], [place], [place] [address]    Report Status 11/08/2020 FINAL  Final  Blood Culture (routine x 2)     Status: Abnormal   Collection Time: 11/07/20  5:58 PM   Specimen: BLOOD  Result Value Ref Range Status   Specimen Description   Final    BLOOD RIGHT ANTECUBITAL Performed at [facility], [address] [place], [place] [address]    Special Requests   Final    BOTTLES DRAWN AEROBIC AND ANAEROBIC Blood Culture results may not be optimal due to an excessive volume of blood received in culture bottles Performed at [facility], [address] [place], [place] [address]    Culture  Setup Time   Final    GRAM POSITIVE COCCI IN BOTH AEROBIC AND ANAEROBIC BOTTLES CRITICAL VALUE NOTED.  VALUE IS CONSISTENT WITH PREVIOUSLY REPORTED AND  CALLED VALUE.    Culture (A)  Final    GROUP B STREP(S.AGALACTIAE)ISOLATED SUSCEPTIBILITIES PERFORMED ON PREVIOUS CULTURE WITHIN THE LAST 5 DAYS. Performed at [facility], [place] [address], [place], [place] [address]    Report Status 11/11/2020 FINAL  Final  Blood Culture (routine x 2)     Status: Abnormal   Collection Time: 11/07/20  5:58 PM   Specimen: BLOOD  Result Value Ref Range Status   Specimen Description   Final    BLOOD BLOOD LEFT FOREARM Performed at [facility], [address] [place] [address] [place], [place] [address]    Special Requests   Final    BOTTLES DRAWN AEROBIC AND ANAEROBIC Blood Culture adequate volume Performed at [facility], [address] [place], [place] [address]    Culture  Setup Time   Final    Organism ID to follow GRAM POSITIVE COCCI IN BOTH AEROBIC AND ANAEROBIC BOTTLES CRITICAL RESULT CALLED TO, READ BACK BY AND VERIFIED WITH: SUSAN WATSON AT 5784 12/21/821 [place] Performed at [facility], [address] [place], [place] [address]    Culture GROUP B STREP(S.AGALACTIAE)ISOLATED (A)  Final   Report Status 11/10/2020 FINAL  Final   Organism ID, Bacteria GROUP B STREP(S.AGALACTIAE)ISOLATED  Final      Susceptibility   Group b strep(s.agalactiae)isolated - MIC*    CLINDAMYCIN >=1 RESISTANT Resistant     AMPICILLIN <=0.25 SENSITIVE Sensitive     ERYTHROMYCIN 2 RESISTANT Resistant     VANCOMYCIN 0.5 SENSITIVE Sensitive     CEFTRIAXONE <=0.12 SENSITIVE Sensitive     LEVOFLOXACIN 1 SENSITIVE Sensitive     PENICILLIN Value in next row Sensitive      SENSITIVE<=0.06    * GROUP B STREP(S.AGALACTIAE)ISOLATED  Resp Panel by RT-PCR (Flu A&B, Covid) Nasopharyngeal Swab     Status: None   Collection Time: 11/07/20  5:58 PM   Specimen: Nasopharyngeal Swab; Nasopharyngeal(NP) swabs in vial transport medium  Result Value Ref Range Status   SARS Coronavirus 2 by RT PCR NEGATIVE NEGATIVE Final    Comment: (NOTE) SARS-CoV-2 target  nucleic acids are NOT DETECTED.  The SARS-CoV-2 RNA is generally detectable in upper respiratory specimens during the acute phase of infection. The lowest concentration of SARS-CoV-2 viral copies this assay can detect is 138 copies/mL. A negative result does not preclude SARS-Cov-2 infection and should not be used as the sole basis for treatment or other patient management decisions. A negative result may occur with  improper specimen collection/handling, submission of specimen other than nasopharyngeal swab, presence of viral mutation(s)  within the areas targeted by this assay, and inadequate number of viral copies(<138 copies/mL). A negative result must be combined with clinical observations, patient history, and epidemiological information. The expected result is Negative.  Fact Sheet for Patients:  EntrepreneurPulse.com.au  Fact Sheet for Healthcare Providers:  IncredibleEmployment.be  This test is no t yet approved or cleared by the [place] FDA and  has been authorized for detection and/or diagnosis of SARS-CoV-2 by FDA under an Emergency Use Authorization (EUA). This EUA will remain  in effect (meaning this test can be used) for the duration of the COVID-19 declaration under Section 564(b)(1) of the Act, 21 U.S.C.section 360bbb-3(b)(1), unless the authorization is terminated  or revoked sooner.       Influenza A by PCR NEGATIVE NEGATIVE Final   Influenza B by PCR NEGATIVE NEGATIVE Final    Comment: (NOTE) The Xpert Xpress SARS-CoV-2/FLU/RSV plus assay is intended as an aid in the diagnosis of influenza from Nasopharyngeal swab specimens and should not be used as a sole basis for treatment. Nasal washings and aspirates are unacceptable for Xpert Xpress SARS-CoV-2/FLU/RSV testing.  Fact Sheet for Patients: EntrepreneurPulse.com.au  Fact Sheet for Healthcare  Providers: IncredibleEmployment.be  This test is not yet approved or cleared by the [place] FDA and has been authorized for detection and/or diagnosis of SARS-CoV-2 by FDA under an Emergency Use Authorization (EUA). This EUA will remain in effect (meaning this test can be used) for the duration of the COVID-19 declaration under Section 564(b)(1) of the Act, 21 U.S.C. section 360bbb-3(b)(1), unless the authorization is terminated or revoked.  Performed at [facility], [address] [place], [place] [address]   Blood Culture ID Panel (Reflexed)     Status: Abnormal   Collection Time: 11/07/20  5:58 PM  Result Value Ref Range Status   Enterococcus faecalis NOT DETECTED NOT DETECTED Final   Enterococcus Faecium NOT DETECTED NOT DETECTED Final   Listeria monocytogenes NOT DETECTED NOT DETECTED Final   Staphylococcus species NOT DETECTED NOT DETECTED Final   Staphylococcus aureus (BCID) NOT DETECTED NOT DETECTED Final   Staphylococcus epidermidis NOT DETECTED NOT DETECTED Final   Staphylococcus lugdunensis NOT DETECTED NOT DETECTED Final   Streptococcus species DETECTED (A) NOT DETECTED Final    Comment: CRITICAL RESULT CALLED TO, READ BACK BY AND VERIFIED WITH:  SUSAN WATSON AT 1307 11/08/20 SDR    Streptococcus agalactiae DETECTED (A) NOT DETECTED Final    Comment: CRITICAL RESULT CALLED TO, READ BACK BY AND VERIFIED WITH:  SUSAN WATSON AT 1307 11/08/20 SDR    Streptococcus pneumoniae NOT DETECTED NOT DETECTED Final   Streptococcus pyogenes NOT DETECTED NOT DETECTED Final   A.calcoaceticus-baumannii NOT DETECTED NOT DETECTED Final   Bacteroides fragilis NOT DETECTED NOT DETECTED Final   Enterobacterales NOT DETECTED NOT DETECTED Final   Enterobacter cloacae complex NOT DETECTED NOT DETECTED Final   Escherichia coli NOT DETECTED NOT DETECTED Final   Klebsiella aerogenes NOT DETECTED NOT DETECTED Final   Klebsiella oxytoca NOT DETECTED NOT  DETECTED Final   Klebsiella pneumoniae NOT DETECTED NOT DETECTED Final   Proteus species NOT DETECTED NOT DETECTED Final   Salmonella species NOT DETECTED NOT DETECTED Final   Serratia marcescens NOT DETECTED NOT DETECTED Final   Haemophilus influenzae NOT DETECTED NOT DETECTED Final   Neisseria meningitidis NOT DETECTED NOT DETECTED Final   Pseudomonas aeruginosa NOT DETECTED NOT DETECTED Final   Stenotrophomonas maltophilia NOT DETECTED NOT DETECTED Final   Candida albicans NOT DETECTED NOT DETECTED Final  Candida auris NOT DETECTED NOT DETECTED Final   Candida glabrata NOT DETECTED NOT DETECTED Final   Candida krusei NOT DETECTED NOT DETECTED Final   Candida parapsilosis NOT DETECTED NOT DETECTED Final   Candida tropicalis NOT DETECTED NOT DETECTED Final   Cryptococcus neoformans/gattii NOT DETECTED NOT DETECTED Final    Comment: Performed at [facility], [address] [place], [place] [address]  MRSA PCR Screening     Status: None   Collection Time: 11/07/20  8:03 PM   Specimen: Nasopharyngeal  Result Value Ref Range Status   MRSA by PCR NEGATIVE NEGATIVE Final    Comment:        The GeneXpert MRSA Assay (FDA approved for NASAL specimens only), is one component of a comprehensive MRSA colonization surveillance program. It is not intended to diagnose MRSA infection nor to guide or monitor treatment for MRSA infections. Performed at [facility], [address] [place], [place] [address]   Anaerobic culture     Status: Abnormal   Collection Time: 11/07/20  8:13 PM   Specimen: PATH Other  Result Value Ref Range Status   Specimen Description   Final    TOE LEFT Performed at [facility], [address] [place], [place] [address]    Special Requests BONE  Final   Gram Stain   Final    RARE WBC PRESENT,BOTH PMN AND MONONUCLEAR MODERATE GRAM POSITIVE COCCI IN PAIRS IN CLUSTERS    Culture (A)  Final    PREVOTELLA DISIENS BETA LACTAMASE  POSITIVE Performed at [facility], [place] [address], [place], [place] [address]    Report Status 11/12/2020 FINAL  Final  Aerobic/Anaerobic Culture (surgical/deep wound)     Status: None   Collection Time: 11/07/20  8:13 PM   Specimen: PATH Other; Abscess  Result Value Ref Range Status   Specimen Description   Final    ABSCESS Performed at [facility], [address], [place], [place] [address]    Special Requests   Final    LEFT FOOT Performed at [facility], [place], [place] [address]    Gram Stain   Final    RARE WBC PRESENT,BOTH PMN AND MONONUCLEAR ABUNDANT GRAM POSITIVE COCCI IN PAIRS IN CLUSTERS    Culture   Final    FEW GROUP B STREP(S.AGALACTIAE)ISOLATED TESTING AGAINST S. AGALACTIAE NOT ROUTINELY PERFORMED DUE TO PREDICTABILITY OF AMP/PEN/VAN SUSCEPTIBILITY. FEW PREVOTELLA DISIENS BETA LACTAMASE POSITIVE Performed at [facility], [place] [address] [place], [place] [address]    Report Status 11/12/2020 FINAL  Final  Aerobic Culture (superficial specimen)     Status: None   Collection Time: 11/07/20  8:13 PM   Specimen: PATH Other  Result Value Ref Range Status   Specimen Description   Final    ABSCESS Performed at [facility], [address] [place], [place] [address]    Special Requests LEFT FOOT BONE  Final   Gram Stain   Final    NO WBC SEEN FEW GRAM POSITIVE COCCI IN PAIRS IN CLUSTERS    Culture   Final    MODERATE GROUP B STREP(S.AGALACTIAE)ISOLATED TESTING AGAINST S. AGALACTIAE NOT ROUTINELY PERFORMED DUE TO PREDICTABILITY OF AMP/PEN/VAN SUSCEPTIBILITY. Performed at [facility], [place] [address], [place],  [address]    Report Status 11/10/2020 FINAL  Final  CULTURE, BLOOD (ROUTINE X 2) w Reflex to ID Panel     Status: None   Collection Time:  11/11/20 12:48 PM   Specimen: BLOOD  Result Value Ref Range Status   Specimen Description BLOOD BLOOD RIGHT HAND  Final   Special Requests    Final    BOTTLES DRAWN AEROBIC AND ANAEROBIC Blood Culture adequate volume   Culture   Final    NO GROWTH 5 DAYS Performed at [facility], [address] [place], [place] [address]    Report Status 11/16/2020 FINAL  Final  CULTURE, BLOOD (ROUTINE X 2) w Reflex to ID Panel     Status: None   Collection Time: 11/11/20  1:06 PM   Specimen: BLOOD  Result Value Ref Range Status   Specimen Description BLOOD LEFT ANTECUBITAL  Final   Special Requests   Final    BOTTLES DRAWN AEROBIC AND ANAEROBIC Blood Culture adequate volume   Culture   Final    NO GROWTH 5 DAYS Performed at [facility], [address] [place], [place] [address]    Report Status 11/16/2020 FINAL  Final     Radiology Studies: No results found.   LOS: 10 days   Antonieta Pert, MD [facility]  11/17/2020, 7:47 AM

## 2020-11-17 NOTE — Progress Notes (Signed)
[facility]  Daily Progress Note   Subjective  -   Significant oozing from the lateral aspect of the wound that has gone through the bandage.  No fever or chills.  Pain as would be expected on postop day 1  Objective Vitals:   11/17/20 0007 11/17/20 0735 11/17/20 0927 11/17/20 1128  BP: 138/85 (!) 163/89  (!) 152/91  Pulse: 85 84  82  Resp: 17 18  18   Temp: 97.9 F (36.6 C) 98.2 F (36.8 C)  97.7 F (36.5 C)  TempSrc:      SpO2: 97% 100%  100%  Weight:   104 kg   Height:        Intake/Output Summary (Last 24 hours) at 11/17/2020 1139 Last data filed at 11/17/2020 1013 Gross per 24 hour  Intake 1710 ml  Output 2900 ml  Net -1190 ml    PULM  CTAB CV  RRR VASC  stump looks clean.  There is some oozing from the lateral portion of the wound although it is very slow.  We took down the dressing today and a new tight Ace wrap/Kerlix/Xeroform gauze dressing will be placed.  Laboratory CBC    Component Value Date/Time   WBC 15.3 (H) 11/17/2020 0400   HGB 7.5 (L) 11/17/2020 0400   HCT 23.7 (L) 11/17/2020 0400   PLT 442 (H) 11/17/2020 0400    BMET    Component Value Date/Time   NA 132 (L) 11/17/2020 0400   K 4.1 11/17/2020 0400   CL 100 11/17/2020 0400   CO2 25 11/17/2020 0400   GLUCOSE 316 (H) 11/17/2020 0400   BUN 18 11/17/2020 0400   CREATININE 1.03 11/17/2020 0400   CALCIUM 8.2 (L) 11/17/2020 0400   GFRNONAA >60 11/17/2020 0400   GFRAA >60 05/05/2020 1930    Assessment/Planning: POD #1 s/p left BKA   Significant oozing from skin edge on the lateral portion of the wound.  Dressing was taken down and a new tight Ace wrap will be placed.  Doing a good job of straightening his knee.  Wound looks good.  PT/OT can start today    05/07/2020  11/17/2020, 11:39 AM

## 2020-11-17 NOTE — Plan of Care (Signed)

## 2020-11-17 NOTE — Evaluation (Signed)
Occupational Therapy Evaluation Patient Details Name: Richard Mendoza MRN: [id number] DOB: 02-05-1976 Today's Date: 11/17/2020    History of Present Illness Richard Mendoza is a 44 y.o. 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   Clinical Impression   Richard Mendoza was seen for OT evaluation this date. Prior to hospital admission, pt was Independent for mobility including working full time. Pt lives c family (wife and teenage children) and has his mother available for 24/7 care if needed. Pt reports 3 STE c L rail. Pt presents to acute OT demonstrating impaired ADL performance and functional mobility 2/2 decreased activity tolerance and functional balance deficits. Pt currently requires SBA + RW for toilet t/f including clothing mgmt in standing using L grab bar. MOD I don R sock at bed level. Pt would benefit from skilled OT to address noted impairments and functional limitations (see below for any additional details) in order to maximize safety and independence while minimizing falls risk and caregiver burden. Upon hospital discharge, recommend HHOT to maximize pt safety and return to functional independence during meaningful occupations of daily life.     Follow Up Recommendations  Home health OT;Supervision/Assistance - 24 hour    Equipment Recommendations  3 in 1 bedside commode;Other (comment) Adult nurse)    Recommendations for Other Services       Precautions / Restrictions Restrictions Weight Bearing Restrictions: No      Mobility Bed Mobility Overal bed mobility: Modified Independent             General bed mobility comments: HoB elevated and L rail use    Transfers Overall transfer level: Needs assistance Equipment used: Rolling walker (2 wheeled) Transfers: Sit to/from Stand Sit to  Stand: Supervision         General transfer comment: SBA + RW including managing IV pole    Balance Overall balance assessment: Needs assistance Sitting-balance support: No upper extremity supported;Feet supported Sitting balance-Leahy Scale: Good     Standing balance support: Single extremity supported;During functional activity Standing balance-Leahy Scale: Fair Standing balance comment: requires single UE support                           ADL either performed or assessed with clinical judgement   ADL Overall ADL's : Needs assistance/impaired                                       General ADL Comments: SBA + RW for toilet t/f including clothing mgmt in standing using L grab bar. MOD I don R sock at bed level         Hand Dominance Right   Extremity/Trunk Assessment Upper Extremity Assessment Upper Extremity Assessment: Overall WFL for tasks assessed   Lower Extremity Assessment Lower Extremity Assessment: LLE deficits/detail;Overall WFL for tasks assessed LLE Deficits / Details: L BKA       Communication Communication Communication: No difficulties   Cognition Arousal/Alertness: Awake/alert Behavior During Therapy: WFL for tasks assessed/performed Overall Cognitive Status: Within Functional Limits for tasks assessed  General Comments  New bleeding noted to L amputation sight - RN notified    Exercises Exercises: Other exercises Other Exercises Other Exercises: Pt educated re: OT role, DME recs, d/c recs, falls prevention, ECS, adapted dressing techniques, home/routines modifications Other Exercises: LBD, toileitng, clothing mgmt, sup>sit, sit<>stand, sitting/standing balance/tolerance   Shoulder Instructions      Home Living Family/patient expects to be discharged to:: Private residence Living Arrangements: Spouse/significant other;Children Available Help at Discharge:  Family;Available 24 hours/day Type of Home: House Home Access: Stairs to enter [employer] of Steps: 3 Entrance Stairs-Rails: Left Home Layout: One level     Bathroom Shower/Tub: Walk-in shower         Home Equipment: Shower seat - built in;Grab bars - toilet;Other (comment) (adjustable bed)   Additional Comments: Reports wife works variable schedule and teenage children are home PRN, his mother can stay to provide 24/7 coverage      Prior Functioning/Environment Level of Independence: Independent        Comments: Works full time        OT Problem List: Decreased activity tolerance;Impaired balance (sitting and/or standing);Decreased safety awareness      OT Treatment/Interventions: Self-care/ADL training;Therapeutic exercise;Energy conservation;DME and/or AE instruction;Therapeutic activities;Patient/family education;Balance training    OT Goals(Current goals can be found in the care plan section) Acute Rehab OT Goals Patient Stated Goal: To get a prosthesis OT Goal Formulation: With patient/family Time For Goal Achievement: 12/01/20 Potential to Achieve Goals: Good ADL Goals Pt Will Perform Grooming: with modified independence;standing (c LRAD PRN) Pt Will Transfer to Toilet: with modified independence;ambulating;regular height toilet (c LRAD PRN) Additional ADL Goal #1: Pt will Independently verbalize plan to implement x3 falls prevention strategies  OT Frequency: Min 1X/week    AM-PAC OT "6 Clicks" Daily Activity     Outcome Measure Help from another person eating meals?: None Help from another person taking care of personal grooming?: A Little Help from another person toileting, which includes using toliet, bedpan, or urinal?: A Little Help from another person bathing (including washing, rinsing, drying)?: A Little Help from another person to put on and taking off regular upper body clothing?: None Help from another person to put on and taking off  regular lower body clothing?: A Little 6 Click Score: 20   End of Session Equipment Utilized During Treatment: Rolling walker Nurse Communication: Other (comment) (blood on dressing)  Activity Tolerance: Patient tolerated treatment well Patient left: in bed;with call bell/phone within reach;with family/visitor present  OT Visit Diagnosis: Other abnormalities of gait and mobility (R26.89)                Time: 6578-4696 OT Time Calculation (min): 19 min Charges:  OT General Charges $OT Visit: 1 Visit OT Evaluation $OT Eval Low Complexity: 1 Low OT Treatments $Self Care/Home Management : 8-22 mins   Kathie Dike, M.S. OTR/L  11/17/20, 3:24 PM  ascom [phone]

## 2020-11-17 NOTE — Progress Notes (Signed)
Inpatient Diabetes Program Recommendations  AACE/ADA: New Consensus Statement on Inpatient Glycemic Control  Target Ranges:  Prepandial:   less than 140 mg/dL      Peak postprandial:   less than 180 mg/dL (1-2 hours)      Critically ill patients:  140 - 180 mg/dL   Results for HELMUTH, RECUPERO (MRN [id number]) as of 11/17/2020 07:29  Ref. Range 11/16/2020 22:00 11/17/2020 04:00  Glucose Latest Ref Range: 70 - 99 mg/dL 941 (HH) 740 (H)   Results for KEMONTE, ULLMAN (MRN [id number]) as of 11/17/2020 07:29  Ref. Range 11/16/2020 07:32 11/16/2020 10:31 11/16/2020 11:27 11/16/2020 12:47 11/16/2020 13:28 11/16/2020 17:00 11/16/2020 21:17 11/17/2020 00:09  Glucose-Capillary Latest Ref Range: 70 - 99 mg/dL 314 (H)  Novolog 4 units 129 (H)    Decadron 10 mg 131 (H) 169 (H) 232 (H)  Novolog 14 units 452 (H)  Novolog 20 units 461 (H)  Novolog 22 units   Review of Glycemic Control  Current orders for Inpatient glycemic control:Lantus 77 units daily, Novolog 7 units TID with meals, Novolog 0-20 units AC&HS  Inpatient Diabetes Program Recommendations:    Insulin: Lantus was NOT GIVEN on 11/16/20 and patient received one time Decadron 10 mg on 11/16/20. As a result, glucose up to 552 mg/dl at 97:02. Please consider increasing Lantus to 80 units daily and start now.  Thanks, Orlando Penner, RN, MSN, CDE Diabetes Coordinator Inpatient Diabetes Program [phone] (Team Pager from 8am to 5pm)

## 2020-11-17 NOTE — Progress Notes (Signed)
Date of Admission:  11/07/2020       Subjective: Patient underwent left BKA yesterday. Some oozing today.  Dressing was changed. Has participated in  PT using crutches Has phantom limb pain   Medications:  . sodium chloride   Intravenous Once  . acetaminophen  650 mg Oral Q6H  . atorvastatin  40 mg Oral Daily  . busPIRone  15 mg Oral BID  . Chlorhexidine Gluconate Cloth  6 each Topical Daily  . dicyclomine  10 mg Oral TID AC & HS  . enoxaparin (LOVENOX) injection  50 mg Subcutaneous Daily  . famotidine  20 mg Oral BID  . ferrous sulfate  325 mg Oral BID WC  . gabapentin  600 mg Oral QHS  . gabapentin  300 mg Oral BID  . insulin aspart  0-20 Units Subcutaneous TID AC & HS  . insulin aspart  7 Units Subcutaneous TID WC  . insulin glargine  80 Units Subcutaneous Daily  . lidocaine  1 patch Transdermal Q24H  . lisinopril  5 mg Oral Daily  . methocarbamol  750 mg Oral QID  . metoCLOPramide  10 mg Oral TID AC  . multivitamin with minerals  1 tablet Oral Daily  . pantoprazole  40 mg Oral Daily  . sertraline  100 mg Oral Daily  . vitamin B-12  500 mcg Oral Daily    Objective: Vital signs in last 24 hours: Temp:  [97.7 F (36.5 C)-98.2 F (36.8 C)] 97.7 F (36.5 C) (12/30 1128) Pulse Rate:  [74-91] 82 (12/30 1128) Resp:  [16-18] 18 (12/30 1128) BP: (138-163)/(85-91) 152/91 (12/30 1128) SpO2:  [95 %-100 %] 100 % (12/30 1128) Weight:  [104 kg] 104 kg (12/30 0927)   Patient Vitals for the past 24 hrs:  BP Temp Temp src Pulse Resp SpO2 Weight  11/17/20 1128 (!) 152/91 97.7 F (36.5 C) -- 82 18 100 % --  11/17/20 0927 -- -- -- -- -- -- 104 kg  11/17/20 0735 (!) 163/89 98.2 F (36.8 C) -- 84 18 100 % --  11/17/20 0007 138/85 97.9 F (36.6 C) -- 85 17 97 % --  11/16/20 1947 (!) 149/88 97.8 F (36.6 C) -- 91 17 95 % --  11/16/20 1621 (!) 153/86 97.7 F (36.5 C) Oral 74 16 100 % --   PHYSICAL EXAM:  General: Alert, cooperative, no distress, pale Head: Normocephalic,  without obvious abnormality, atraumatic. Eyes: Conjunctivae clear, anicteric sclerae. Pupils are equal ENT Nares normal. No drainage or sinus tenderness. Lips, mucosa, and tongue normal. No Thrush Neck: Supple, symmetrical, no adenopathy, thyroid: non tender no carotid bruit and no JVD. Back: No CVA tenderness. Lungs: Clear to auscultation bilaterally. No Wheezing or Rhonchi. No rales. Heart: Regular rate and rhythm, no murmur, rub or gallop. Abdomen: Soft, non-tender,not distended. Bowel sounds normal. No masses Extremities: Left BKA Surgical dressing not removed Pictures reviewed     Dark clot at the suture site seen Skin: No rashes or lesions. Or bruising Lymph: Cervical, supraclavicular normal. Neurologic: Grossly non-focal  Lab Results Recent Labs    11/16/20 0432 11/17/20 0400  WBC 11.6* 15.3*  HGB 7.5* 7.5*  HCT 24.5* 23.7*  NA 136 132*  K 3.9 4.1  CL 101 100  CO2 25 25  BUN 13 18  CREATININE 1.09 1.03    Assessment/Plan:  Diabetic foot infection with necrotizing infection of the left foot.  Status post third toe amputation and debridement of infected tissue.  But as the necrosis  and infection where progressing he underwent left BKA Currently on Zosyn. Group B streptococcus and anaerobes in the culture from the first surgery  Group B streptococcus bacteremia day 9 of antibiotic.  Will need a total of 10-14 days of IV antibiotic.  But since he has had the amputation will give IV antibiotic for a minimum of 72 hours.  We will need to assess how the stump wound is progressing  Anemia hemoglobin is 7.5 he had received 1 unit of PRBC.  Diabetes mellitus or insulin  CKD  Discussed the management with the patient in great detail. ID will follow him peripherally over the weekend. Call if needed.

## 2020-11-18 DIAGNOSIS — E11628 Type 2 diabetes mellitus with other skin complications: Secondary | ICD-10-CM | POA: Diagnosis not present

## 2020-11-18 DIAGNOSIS — M726 Necrotizing fasciitis: Secondary | ICD-10-CM | POA: Diagnosis not present

## 2020-11-18 DIAGNOSIS — B951 Streptococcus, group B, as the cause of diseases classified elsewhere: Secondary | ICD-10-CM | POA: Diagnosis not present

## 2020-11-18 DIAGNOSIS — L089 Local infection of the skin and subcutaneous tissue, unspecified: Secondary | ICD-10-CM | POA: Diagnosis not present

## 2020-11-18 LAB — BASIC METABOLIC PANEL
Anion gap: 7 (ref 5–15)
BUN: 14 mg/dL (ref 6–20)
CO2: 25 mmol/L (ref 22–32)
Calcium: 8 mg/dL — ABNORMAL LOW (ref 8.9–10.3)
Chloride: 103 mmol/L (ref 98–111)
Creatinine, Ser: 0.96 mg/dL (ref 0.61–1.24)
GFR, Estimated: >60 mL/min (ref >60)
Glucose, Bld: 276 mg/dL — ABNORMAL HIGH (ref 70–99)
Potassium: 3.9 mmol/L (ref 3.5–5.1)
Sodium: 135 mmol/L (ref 135–145)

## 2020-11-18 LAB — CBC
HCT: 22.6 % — ABNORMAL LOW (ref 39.0–52.0)
Hemoglobin: 6.9 g/dL — ABNORMAL LOW (ref 13.0–17.0)
MCH: 27.1 pg (ref 26.0–34.0)
MCHC: 30.5 g/dL (ref 30.0–36.0)
MCV: 88.6 fL (ref 80.0–100.0)
Platelets: 452 10*3/uL — ABNORMAL HIGH (ref 150–400)
RBC: 2.55 MIL/uL — ABNORMAL LOW (ref 4.22–5.81)
RDW: 15.6 % — ABNORMAL HIGH (ref 11.5–15.5)
WBC: 10.3 10*3/uL (ref 4.0–10.5)
nRBC: 0 % (ref 0.0–0.2)

## 2020-11-18 LAB — PREPARE RBC (CROSSMATCH)

## 2020-11-18 LAB — HEMOGLOBIN AND HEMATOCRIT, BLOOD
HCT: 25.2 % — ABNORMAL LOW (ref 39.0–52.0)
Hemoglobin: 7.8 g/dL — ABNORMAL LOW (ref 13.0–17.0)

## 2020-11-18 LAB — GLUCOSE, CAPILLARY
Glucose-Capillary: 229 mg/dL — ABNORMAL HIGH (ref 70–99)
Glucose-Capillary: 140 mg/dL — ABNORMAL HIGH (ref 70–99)
Glucose-Capillary: 174 mg/dL — ABNORMAL HIGH (ref 70–99)
Glucose-Capillary: 231 mg/dL — ABNORMAL HIGH (ref 70–99)

## 2020-11-18 MED ORDER — INSULIN ASPART 100 UNIT/ML ~~LOC~~ SOLN
10.0000 [IU] | Freq: Three times a day (TID) | SUBCUTANEOUS | Status: DC
  Administered 2020-11-18 – 2020-11-21 (×10): 10 [IU] via SUBCUTANEOUS
  Filled 2020-11-18 (×9): qty 1

## 2020-11-18 MED ORDER — INSULIN GLARGINE 100 UNIT/ML ~~LOC~~ SOLN
85.0000 [IU] | Freq: Every day | SUBCUTANEOUS | Status: DC
  Administered 2020-11-19 – 2020-11-20 (×2): 85 [IU] via SUBCUTANEOUS
  Filled 2020-11-18 (×3): qty 0.85

## 2020-11-18 MED ORDER — SODIUM CHLORIDE 0.9% IV SOLUTION: Freq: Once | INTRAVENOUS | Status: AC

## 2020-11-18 MED ORDER — OXYCODONE HCL 5 MG PO TABS: 5.0000 mg | ORAL_TABLET | Freq: Four times a day (QID) | ORAL | Status: DC | PRN

## 2020-11-18 MED ORDER — KETOROLAC TROMETHAMINE 30 MG/ML IJ SOLN
30.0000 mg | Freq: Four times a day (QID) | INTRAMUSCULAR | Status: DC
  Administered 2020-11-18 – 2020-11-20 (×7): 30 mg via INTRAVENOUS
  Filled 2020-11-18 (×7): qty 1

## 2020-11-18 MED ORDER — INSULIN GLARGINE 100 UNIT/ML ~~LOC~~ SOLN
5.0000 [IU] | Freq: Once | SUBCUTANEOUS | Status: AC
  Administered 2020-11-18: 5 [IU] via SUBCUTANEOUS
  Filled 2020-11-18: qty 0.05

## 2020-11-18 MED ORDER — OXYCODONE HCL 5 MG PO TABS
10.0000 mg | ORAL_TABLET | Freq: Four times a day (QID) | ORAL | Status: DC | PRN
  Administered 2020-11-18: 10 mg via ORAL
  Filled 2020-11-18: qty 2

## 2020-11-18 MED ORDER — LISINOPRIL 10 MG PO TABS
10.0000 mg | ORAL_TABLET | Freq: Every day | ORAL | Status: DC
  Administered 2020-11-18: 10 mg via ORAL
  Filled 2020-11-18: qty 1

## 2020-11-18 MED ORDER — OXYCODONE HCL ER 10 MG PO T12A
10.0000 mg | EXTENDED_RELEASE_TABLET | Freq: Two times a day (BID) | ORAL | Status: DC
  Administered 2020-11-18: 10 mg via ORAL
  Filled 2020-11-18 (×2): qty 1

## 2020-11-18 NOTE — Progress Notes (Signed)
Date of Admission:  11/07/2020      Subjective: Pt c/o pain in the left leg which is not controlled with his current medications of oral Percocet and as needed morphine he states No fever Appetite better   Medications:  . sodium chloride   Intravenous Once  . acetaminophen  650 mg Oral Q6H  . atorvastatin  40 mg Oral Daily  . busPIRone  15 mg Oral BID  . Chlorhexidine Gluconate Cloth  6 each Topical Daily  . dicyclomine  10 mg Oral TID AC & HS  . enoxaparin (LOVENOX) injection  50 mg Subcutaneous Daily  . famotidine  20 mg Oral BID  . ferrous sulfate  325 mg Oral BID WC  . gabapentin  600 mg Oral QHS  . gabapentin  300 mg Oral BID  . insulin aspart  0-20 Units Subcutaneous TID AC & HS  . insulin aspart  10 Units Subcutaneous TID WC  . insulin glargine  5 Units Subcutaneous Once  . [START ON 11/19/2020] insulin glargine  85 Units Subcutaneous Daily  . lidocaine  1 patch Transdermal Q24H  . lisinopril  10 mg Oral Daily  . methocarbamol  750 mg Oral QID  . metoCLOPramide  10 mg Oral TID AC  . multivitamin with minerals  1 tablet Oral Daily  . pantoprazole  40 mg Oral Daily  . sertraline  100 mg Oral Daily  . vitamin B-12  500 mcg Oral Daily    Objective: Vital signs in last 24 hours: Temp:  [97.9 F (36.6 C)-98.6 F (37 C)] 98.6 F (37 C) (12/31 1130) Pulse Rate:  [72-92] 76 (12/31 1130) Resp:  [16-19] 16 (12/31 1130) BP: (139-182)/(81-95) 142/82 (12/31 1130) SpO2:  [100 %] 100 % (12/31 1130)  PHYSICAL EXAM:  General: Alert, cooperative, no distress, appears stated age.  Pale Head: Normocephalic, without obvious abnormality, atraumatic. Eyes: Conjunctivae clear, anicteric sclerae. Pupils are equal ENT Nares normal. No drainage or sinus tenderness. Lips, mucosa, and tongue normal. No Thrush Neck: Supple, symmetrical, no adenopathy, thyroid: non tender no carotid bruit and no JVD. Back: No CVA tenderness. Lungs: Clear to auscultation bilaterally. No Wheezing or  Rhonchi. No rales. Heart: Regular rate and rhythm, no murmur, rub or gallop. Abdomen: Soft, non-tender,not distended. Bowel sounds normal. No masses Extremities:   Skin: No rashes or lesions. Or bruising Lymph: Cervical, supraclavicular normal. Neurologic: Grossly non-focal  Lab Results Recent Labs    11/17/20 0400 11/18/20 0432  WBC 15.3* 10.3  HGB 7.5* 6.9*  HCT 23.7* 22.6*  NA 132* 135  K 4.1 3.9  CL 100 103  CO2 25 25  BUN 18 14  CREATININE 1.03 0.96   Liver Panel No results for input(s): PROT, ALBUMIN, AST, ALT, ALKPHOS, BILITOT, BILIDIR, IBILI in the last 72 hours. Sedimentation Rate No results for input(s): ESRSEDRATE in the last 72 hours. C-Reactive Protein No results for input(s): CRP in the last 72 hours.  Microbiology:  Studies/Results: No results found.   Assessment/Plan: Diabetic foot infection with necrotizing infection of the left foot. Status post third toe amputation and debridement of infected tissue. But as the infection/necrosis was progressing he had to undergo left BKA on 11/16/2020.  Group B streptococcus bacteremia. Day 11 of antibiotic. Currently on Unasyn. Initial plan was to stop on 11/19/2020. But now will have to evaluate the stump and get the clearance from vascular to stop antibiotic because of hematoma at the surgical site..   Severe anemia. Some blood loss from the  stump + baseline anemia. He received 1 unit of PRBC and is planned for the second 1 today.  Diabetes mellitus was poorly controlled as outpatient. Currently on insulin.  CKD: Now has normal renal function.  Discussed the management the patient Informed us hospitalist provider regarding patient's request for adequate pain management. ID will follow him remotely this weekend call if needed

## 2020-11-18 NOTE — Evaluation (Signed)
Physical Therapy Evaluation Patient Details Name: Richard Mendoza MRN: [id number] DOB: Jul 02, 1976 Today's Date: 11/18/2020   History of Present Illness  Patient is a 44 year old male who presents to ER with acute onset of worsening left foot infection being referred by his podiatrist. Upon arrival blood glucose is significantly elevated as well as temperature and HR.  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 Prior to hospital admission, pt was independent and lives with his wife and children in a house with 3 steps to enter with L rail.  Patient is in bed upon PT arrival and states he cannot use crutches but is adept with the walker due to history of shoulder injuries. Patient performed supine interventions well and was educated on amputation safety, positioning, exercises, and equipment. Patient transferred supine to sit with mod I and HOB elevated, educated on pausing between each transition for reduction of dizziness and increased safety awareness with patient demonstrating understanding. Patient required supervision/CGA for STS transfer with patient demonstrating safety and safe hand placement. Patient ambulates safely with hop to pattern and no LOB. Patient returned to bed with needs met.   Pt would benefit from skilled PT to address noted impairments and functional limitations (see below for any additional details).  Upon hospital discharge, pt would benefit from HHPT, supervision OOB, RW, and BSC.     Follow Up Recommendations Home health PT;Supervision for mobility/OOB    Equipment Recommendations  Rolling walker with 5" wheels;3in1 (PT)    Recommendations for Other Services       Precautions / Restrictions Precautions Precautions: Other (comment) (BKA LLE) Restrictions Weight Bearing  Restrictions: Yes LLE Weight Bearing: Non weight bearing      Mobility  Bed Mobility Overal bed mobility: Modified Independent             General bed mobility comments: HOB elevated, pushing with arms to get into position.    Transfers Overall transfer level: Needs assistance Equipment used: Rolling walker (2 wheeled) Transfers: Sit to/from Stand Sit to Stand: Supervision         General transfer comment: Supervision/ CGA, patient safe; no need for cueing  Ambulation/Gait Ambulation/Gait assistance: Supervision;Modified independent (Device/Increase time) Gait Distance (Feet): 40 Feet Assistive device: Rolling walker (2 wheeled)   Gait velocity: decreased   General Gait Details: Hop to pattern of ambulation with safe use of RW; unable to use crutches due to history of shoulder injury.  Stairs            Wheelchair Mobility    Modified Rankin (Stroke Patients Only)       Balance Overall balance assessment: Needs assistance Sitting-balance support: No upper extremity supported;Feet unsupported Sitting balance-Leahy Scale: Good Sitting balance - Comments: Patient able to bend and reach without LOB   Standing balance support: During functional activity;Bilateral upper extremity supported Standing balance-Leahy Scale: Fair Standing balance comment: Patient requires use of RW for ambulation due to recent amputation, requires SUE support for static balance.                             Pertinent Vitals/Pain Pain Assessment: 0-10 Pain Score: 9  Pain Location: residual limb Pain Descriptors / Indicators: Aching;Sore Pain Intervention(s): Limited activity within patient's tolerance;Monitored during session;Repositioned;Patient requesting  pain meds-RN notified    Home Living Family/patient expects to be discharged to:: Private residence Living Arrangements: Spouse/significant other;Children Available Help at Discharge: Family;Available 24  hours/day Type of Home: House Home Access: Stairs to enter Entrance Stairs-Rails: Left Entrance Stairs-Number of Steps: 3 Home Layout: One level Home Equipment: Shower seat - built in;Grab bars - toilet;Other (comment) (adjustable bed) Additional Comments: Reports wife works variable schedule and teenage children are home PRN, his mother can stay to provide 24/7 coverage    Prior Function Level of Independence: Independent         Comments: Works full time, 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     Hand Dominance   Dominant Hand: Right    Extremity/Trunk Assessment   Upper Extremity Assessment Upper Extremity Assessment: Defer to OT evaluation    Lower Extremity Assessment Lower Extremity Assessment: RLE deficits/detail;LLE deficits/detail RLE Deficits / Details: grossly 4+/5 LLE Deficits / Details: L BKA, no resistance applied due to recent amputation. LLE: Unable to fully assess due to pain LLE Coordination: decreased gross motor    Cervical / Trunk Assessment Cervical / Trunk Assessment: Normal  Communication   Communication: No difficulties  Cognition Arousal/Alertness: Awake/alert Behavior During Therapy: WFL for tasks assessed/performed Overall Cognitive Status: Within Functional Limits for tasks assessed                                 General Comments: Patient understanding of safety and protocol      General Comments      Exercises Amputee Exercises Quad Sets: AROM;Strengthening;Left;5 reps;Supine Gluteal Sets: Strengthening;Left;5 reps;Supine Towel Squeeze: Strengthening;Both;5 reps;Supine Knee Extension: Strengthening;Left;5 reps;Seated;Supine Straight Leg Raises: Strengthening;Left;5 reps;Supine Other Exercises Other Exercises: Patient educated on role of PT in acute care setting, given pamphlet for HEP and educated on importance of compliance 2-3x/day as well as prone positioning once home. Educated on residual limb positioning,  reduction of risk of contracture. Sit to stand stabilization and mobility.   Assessment/Plan    PT Assessment Patient needs continued PT services  PT Problem List Decreased strength;Decreased range of motion;Decreased activity tolerance;Decreased balance;Decreased knowledge of use of DME;Decreased mobility;Decreased knowledge of precautions;Impaired sensation;Pain       PT Treatment Interventions DME instruction;Gait training;Stair training;Functional mobility training;Therapeutic activities;Patient/family education;Neuromuscular re-education;Balance training;Therapeutic exercise;Manual techniques    PT Goals (Current goals can be found in the Care Plan section)  Acute Rehab PT Goals Patient Stated Goal: To get a prosthesis PT Goal Formulation: With patient Time For Goal Achievement: 12/02/20 Potential to Achieve Goals: Fair    Frequency 7X/week   Barriers to discharge   would benefit from HHPT, RW, and BSC    Co-evaluation               AM-PAC PT "6 Clicks" Mobility  Outcome Measure Help needed turning from your back to your side while in a flat bed without using bedrails?: None Help needed moving from lying on your back to sitting on the side of a flat bed without using bedrails?: A Little Help needed moving to and from a bed to a chair (including a wheelchair)?: A Little Help needed standing up from a chair using your arms (e.g., wheelchair or bedside chair)?: A Little Help needed to walk in hospital room?: A Little Help needed climbing 3-5 steps with a railing? : A Little 6 Click Score: 19    End of Session Equipment Utilized During Treatment: Gait belt Activity Tolerance:  Patient tolerated treatment well;Patient limited by pain Patient left: in bed;with call bell/phone within reach Nurse Communication: Mobility status;Patient requests pain meds PT Visit Diagnosis: Unsteadiness on feet (R26.81);Other abnormalities of gait and mobility (R26.89);Muscle weakness  (generalized) (M62.81);Pain Pain - Right/Left: Left Pain - part of body: Leg    Time: 5945-8592 PT Time Calculation (min) (ACUTE ONLY): 19 min   Charges:   PT Evaluation $PT Eval Low Complexity: 1 Low PT Treatments $Therapeutic Exercise: 8-22 mins       Janna Arch, PT, DPT   11/18/2020, 10:45 AM

## 2020-11-18 NOTE — Plan of Care (Signed)

## 2020-11-18 NOTE — Progress Notes (Signed)
Subjective  - POD #2, s/p left BKA  Still with significant pain in stump   Physical Exam:  Left BKA dressing changed.  Wound appears healthy and intact. He continues to have skin edge bleeding.  Dressing replaced       Assessment/Plan:  POD #2  Acute post op blood loss anemia:  Continue to monitor Hb.  Change dressing as needed Patient instructed on exercises to do in bed and to keep his knee straight Toradol added for pain control.  Monitor creatinine  Richard Mendoza 11/18/2020 4:49 PM --  Vitals:   11/18/20 1322 11/18/20 1511  BP: (!) 155/90 (!) 170/93  Pulse: 82 82  Resp: 16 17  Temp: 98.6 F (37 C) 98.2 F (36.8 C)  SpO2: 100% 100%    Intake/Output Summary (Last 24 hours) at 11/18/2020 1649 Last data filed at 11/18/2020 1638 Gross per 24 hour  Intake 1523.58 ml  Output 2600 ml  Net -1076.42 ml     Laboratory CBC    Component Value Date/Time   WBC 10.3 11/18/2020 0432   HGB 7.8 (L) 11/18/2020 1420   HCT 25.2 (L) 11/18/2020 1420   PLT 452 (H) 11/18/2020 0432    BMET    Component Value Date/Time   NA 135 11/18/2020 0432   K 3.9 11/18/2020 0432   CL 103 11/18/2020 0432   CO2 25 11/18/2020 0432   GLUCOSE 276 (H) 11/18/2020 0432   BUN 14 11/18/2020 0432   CREATININE 0.96 11/18/2020 0432   CALCIUM 8.0 (L) 11/18/2020 0432   GFRNONAA >60 11/18/2020 0432   GFRAA >60 05/05/2020 1930    COAG Lab Results  Component Value Date   INR 1.2 11/07/2020   INR 1.07 11/26/2018   No results found for: PTT  Antibiotics Anti-infectives (From admission, onward)   Start     Dose/Rate Route Frequency Ordered Stop   11/11/20 2222  vancomycin (VANCOCIN) powder  Status:  Discontinued          As needed 11/11/20 2222 11/11/20 2310   11/11/20 1600  Ampicillin-Sulbactam (UNASYN) 3 g in sodium chloride 0.9 % 100 mL IVPB        3 g 200 mL/hr over 30 Minutes Intravenous Every 6 hours 11/11/20 1508     11/11/20 0800  cefTRIAXone (ROCEPHIN) 2 g in sodium  chloride 0.9 % 100 mL IVPB  Status:  Discontinued        2 g 200 mL/hr over 30 Minutes Intravenous Every 24 hours 11/11/20 0122 11/11/20 1506   11/09/20 0600  ceFEPIme (MAXIPIME) 2 g in sodium chloride 0.9 % 100 mL IVPB  Status:  Discontinued        2 g 200 mL/hr over 30 Minutes Intravenous Every 8 hours 11/09/20 0009 11/11/20 0122   11/09/20 0000  metroNIDAZOLE (FLAGYL) IVPB 500 mg  Status:  Discontinued        500 mg 100 mL/hr over 60 Minutes Intravenous Every 8 hours 11/08/20 1954 11/11/20 1507   11/08/20 2200  ceFEPIme (MAXIPIME) 2 g in sodium chloride 0.9 % 100 mL IVPB  Status:  Discontinued        2 g 200 mL/hr over 30 Minutes Intravenous Every 12 hours 11/08/20 1952 11/09/20 0009   11/08/20 1800  vancomycin (VANCOREADY) IVPB 1250 mg/250 mL  Status:  Discontinued        1,250 mg 166.7 mL/hr over 90 Minutes Intravenous Every 24 hours 11/07/20 1939 11/08/20 1258   11/08/20 1800  Ampicillin-Sulbactam (UNASYN) 3  g in sodium chloride 0.9 % 100 mL IVPB  Status:  Discontinued        3 g 200 mL/hr over 30 Minutes Intravenous Every 6 hours 11/08/20 1636 11/08/20 1951   11/08/20 1400  vancomycin (VANCOREADY) IVPB 750 mg/150 mL  Status:  Discontinued        750 mg 150 mL/hr over 60 Minutes Intravenous Every 12 hours 11/08/20 1258 11/10/20 1030   11/08/20 0800  piperacillin-tazobactam (ZOSYN) IVPB 3.375 g  Status:  Discontinued        3.375 g 12.5 mL/hr over 240 Minutes Intravenous Every 8 hours 11/07/20 1939 11/08/20 1636   11/07/20 2200  clindamycin (CLEOCIN) IVPB 600 mg  Status:  Discontinued        600 mg 100 mL/hr over 30 Minutes Intravenous Every 8 hours 11/07/20 1924 11/08/20 0834   11/07/20 1930  vancomycin (VANCOCIN) IVPB 1000 mg/200 mL premix  Status:  Discontinued        1,000 mg 200 mL/hr over 60 Minutes Intravenous  Once 11/07/20 1924 11/07/20 2244   11/07/20 1930  piperacillin-tazobactam (ZOSYN) IVPB 3.375 g  Status:  Discontinued        3.375 g 100 mL/hr over 30 Minutes  Intravenous  Once 11/07/20 1924 11/08/20 1636   11/07/20 1815  vancomycin (VANCOREADY) IVPB 2000 mg/400 mL  Status:  Discontinued        2,000 mg 200 mL/hr over 120 Minutes Intravenous STAT 11/07/20 1801 11/07/20 1802   11/07/20 1815  vancomycin (VANCOCIN) IVPB 1000 mg/200 mL premix  Status:  Discontinued        1,000 mg 200 mL/hr over 60 Minutes Intravenous STAT 11/07/20 1802 11/08/20 1258   11/07/20 1800  ceFEPIme (MAXIPIME) 2 g in sodium chloride 0.9 % 100 mL IVPB        2 g 200 mL/hr over 30 Minutes Intravenous STAT 11/07/20 1757 11/07/20 2303       V. Charlena Cross, M.D., [facility] [place] Office: [phone] Pager:  [phone]

## 2020-11-18 NOTE — Progress Notes (Signed)
Inpatient Diabetes Program Recommendations  AACE/ADA: New Consensus Statement on Inpatient Glycemic Control (2015)  Target Ranges:  Prepandial:   less than 140 mg/dL      Peak postprandial:   less than 180 mg/dL (1-2 hours)      Critically ill patients:  140 - 180 mg/dL   Results for Richard Mendoza, Richard Mendoza (MRN [id number]) as of 11/18/2020 09:00  Ref. Range 11/17/2020 07:37 11/17/2020 12:12 11/17/2020 16:35 11/17/2020 21:35  Glucose-Capillary Latest Ref Range: 70 - 99 mg/dL 297 (H)  22 units NOVOLOG  80 units LANTUS  230 (H)  14 units NOVOLOG  238 (H)  14 units NOVOLOG  269 (H)  11 units NOVOLOG    Results for Richard Mendoza, Richard Mendoza (MRN [id number]) as of 11/18/2020 09:00  Ref. Range 11/18/2020 07:39  Glucose-Capillary Latest Ref Range: 70 - 99 mg/dL 740 (H)   Home DM Meds:Insulin Pump Actos 30 mg Daily  Current Orders:Lantus 80 units Daily                            Novolog Resistant Correction Scale/ SSI (0-20 units) TID AC + HS                            Novolog 10 units TID with meals    Endocrinologist: Rudi Heap, FNP with [facility]--last seen 08/31/2020 Basal rates= 3.6 units/hr Total Basal per 24 hour period= 86.4 units Carbohydrate Ratio= 1 unit for every 4 grams Carbohydrates Correction Factor= 1 unit for every 20 mg/dl above Target CBG   MD- Note Novolog Meal Coverage increased this AM  AM CBG remains elevated  Please consider increasing the Lantus to 85 units Daily  If 80 unit dose already given this AM, please also order Lantus 5 units X 1 to be given this AM as well    --Will follow patient during hospitalization--  Ambrose Finland RN, MSN, CDE Diabetes Coordinator Inpatient Glycemic Control Team Team Pager: [phone] (8a-5p)

## 2020-11-18 NOTE — Progress Notes (Signed)
PROGRESS NOTE    Richard Mendoza  XTG:[id number] DOB: 12-12-1975 DOA: 11/07/2020 PCP: Tracie Harrier, MD   Brief Narrative: 44 year old male with type II uncontrolled diabetes on insulin pump, hypertension, dyslipidemia presents to the ED with left foot infection acute, worsening, was referred by podiatrist.  Patient endorsed uncontrolled blood sugar level, also nausea, fever and chills at home.  He was on doxycycline by Dr. Vickki Muff but infection was not improving. He in the ED he was febrile and lab with a leukocytosis, COVID-19 negative, left foot x-ray diffuse subcutaneous gas along the plantar foot extending to the third toe concerning for necrotizing fasciitis, patient was seen by Dr. 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  Appreciate ID input monitor how the wound is healing, continue IV Unasyn at least 72 hours postop for group B strep infection.afebrile and no leukocytosis currently.  Continue dressing change PT OT as per vascular surgery.  Pain poorly controlled  he states he is on Oxy 5 at home will increase to 10 mg, discussed about minimizing opiates. Cont IV opiates for breakthrough pain.  Dressing was taken down and new tight Ace wrap applied by vascular 12/30. PT OT started.  Leukocytosis  has resolved.  Group B Streptococcus sepsis POA also Prevotella bacteremia in anaerobic culture: Patient met sepsis criteria leukocytosis 11.8, fever 102 on admission, will need total 14 days of IV antibiotics but may just need 72 hours at least postop.    Anemia from chronic disease:received 1 unit prbc 12/29 am.  Hemoglobin noted to be further downtrending patient had oozing and bleeding from the amputation site likely the etiology from acute blood loss.  Will transfuse 1 unit PRBC this morning.  Recent Labs  Lab 11/14/20 0419 11/15/20 0554 11/16/20 0432 11/17/20 0400 11/18/20 0432  HGB 7.7* 7.0* 7.5* 7.5* 6.9*  HCT 25.2* 23.2* 24.5* 23.7* 22.6*   GERD continue PPI  Hypertension blood pressure borderline controlled.  Continue home lisinopril may need to increase the dose.  Diabetes mellitus on insulin pump at home, poorly controlled/nonketotic hyperosmolar hyperglycemia: Last hemoglobin A1c 12, off insulin drip.  Blood sugar fluctuating, also received steroid postop.  Blood sugar is borderline controlled, continue Lantus increased to 85 units, increase premeal insulin to 10 units and sliding scale insulin.  He is on Reglan.  DM coordinator following.  Monitor blood sugar and adjust insulin regimen.  Recent Labs  Lab 11/17/20 0737 11/17/20 1212 11/17/20 1635 11/17/20 2135 11/18/20 0739  GLUCAP 307* 230* 238* 269* 229*   CKD IIIa renal function is stable at baseline at 1.0.  Monitor closely. Recent Labs  Lab 11/14/20 0419 11/15/20 0554 11/16/20 0432 11/17/20 0400 11/18/20 0432  BUN _0 CREATININE 1.01 1.06 1.09 1.03 0.96   Hyperlipidemia continue statin  Low back pain history of right paracentral disc protrusion at L5-S1, CT  L-spine no acute finding, continue pain control. B12 deficiency on B12 supplement Peripheral neuropathy/chronic pain, on Neurontin.  Takes Vicodin at home as well. Headache, migraine type retro-orbital but no history of migraine, Imitrex  25 mg p.o. every 2 hours as needed consider subcommittees versus Fioricet if ineffective.  Hyponatremia likely pseudohyponatremia from hyperglycemia.  Monitor Hypokalemia resolved  Anxiety disorder: he is on BuSpar and Zoloft.  Morbid Obesity weight body mass index is 33.55 kg/m.  Will benefit with weight loss.  Nutrition: Diet Order            Diet Carb Modified Fluid consistency: Thin; Room service appropriate? Yes  Diet effective now                 DVT prophylaxis: Lovenox Code Status:   Code Status: Full Code  Family Communication: plan of care discussed with patient and his wife at bedside.  Status is: Inpatient  Remains inpatient appropriate because:IV treatments appropriate due to intensity of illness or inability to take PO, Inpatient level of care appropriate due to severity of illness and ongoing surgical need   Dispo: The patient is from: Home              Anticipated d/c is to: SNF vs HH              Anticipated d/c date is: 2 days once okay with ID/vascular              Patient currently is not medically stable to d/c.   Consultants:see note  Procedures:see note  Culture/Microbiology    Component Value Date/Time   SDES BLOOD LEFT ANTECUBITAL 11/11/2020 1306   SPECREQUEST  11/11/2020 1306    BOTTLES DRAWN AEROBIC AND ANAEROBIC Blood Culture adequate volume   CULT  11/11/2020 1306    NO GROWTH 5 DAYS Performed at [facility], [address] [place], [place] [address]    REPTSTATUS 11/16/2020 FINAL 11/11/2020 1306    Other culture-see note  Medications: Scheduled Meds: . sodium chloride   Intravenous Once  . acetaminophen  650 mg Oral Q6H  . atorvastatin  40 mg Oral Daily  . busPIRone  15 mg Oral BID  . Chlorhexidine Gluconate Cloth  6 each Topical Daily  . dicyclomine  10 mg Oral TID AC & HS  . enoxaparin (LOVENOX) injection  50 mg Subcutaneous Daily  . famotidine  20 mg Oral BID  . ferrous sulfate  325 mg Oral BID WC  . gabapentin   600 mg Oral QHS  . gabapentin  300 mg Oral BID  . insulin aspart  0-20 Units Subcutaneous TID AC & HS  . insulin aspart  10 Units Subcutaneous TID WC  . insulin glargine  80 Units Subcutaneous Daily  . lidocaine  1 patch Transdermal Q24H  . lisinopril  10 mg Oral Daily  . methocarbamol  750 mg Oral QID  . metoCLOPramide  10 mg Oral TID AC  . multivitamin with minerals  1 tablet Oral Daily  . pantoprazole  40 mg Oral Daily  . sertraline  100 mg Oral Daily  . vitamin B-12  500 mcg Oral Daily   Continuous Infusions: . sodium chloride Stopped (11/13/20 0515)  . ampicillin-sulbactam (UNASYN) IV 3 g (11/18/20 0541)    Antimicrobials: Anti-infectives (From admission, onward)   Start     Dose/Rate Route Frequency Ordered Stop   11/11/20 2222  vancomycin (VANCOCIN) powder  Status:  Discontinued  As needed 11/11/20 2222 11/11/20 2310   11/11/20 1600  Ampicillin-Sulbactam (UNASYN) 3 g in sodium chloride 0.9 % 100 mL IVPB        3 g 200 mL/hr over 30 Minutes Intravenous Every 6 hours 11/11/20 1508     11/11/20 0800  cefTRIAXone (ROCEPHIN) 2 g in sodium chloride 0.9 % 100 mL IVPB  Status:  Discontinued        2 g 200 mL/hr over 30 Minutes Intravenous Every 24 hours 11/11/20 0122 11/11/20 1506   11/09/20 0600  ceFEPIme (MAXIPIME) 2 g in sodium chloride 0.9 % 100 mL IVPB  Status:  Discontinued        2 g 200 mL/hr over 30 Minutes Intravenous Every 8 hours 11/09/20 0009 11/11/20 0122   11/09/20 0000  metroNIDAZOLE (FLAGYL) IVPB 500 mg  Status:  Discontinued        500 mg 100 mL/hr over 60 Minutes Intravenous Every 8 hours 11/08/20 1954 11/11/20 1507   11/08/20 2200  ceFEPIme (MAXIPIME) 2 g in sodium chloride 0.9 % 100 mL IVPB  Status:  Discontinued        2 g 200 mL/hr over 30 Minutes Intravenous Every 12 hours 11/08/20 1952 11/09/20 0009   11/08/20 1800  vancomycin (VANCOREADY) IVPB 1250 mg/250 mL  Status:  Discontinued        1,250 mg 166.7 mL/hr over 90 Minutes Intravenous Every  24 hours 11/07/20 1939 11/08/20 1258   11/08/20 1800  Ampicillin-Sulbactam (UNASYN) 3 g in sodium chloride 0.9 % 100 mL IVPB  Status:  Discontinued        3 g 200 mL/hr over 30 Minutes Intravenous Every 6 hours 11/08/20 1636 11/08/20 1951   11/08/20 1400  vancomycin (VANCOREADY) IVPB 750 mg/150 mL  Status:  Discontinued        750 mg 150 mL/hr over 60 Minutes Intravenous Every 12 hours 11/08/20 1258 11/10/20 1030   11/08/20 0800  piperacillin-tazobactam (ZOSYN) IVPB 3.375 g  Status:  Discontinued        3.375 g 12.5 mL/hr over 240 Minutes Intravenous Every 8 hours 11/07/20 1939 11/08/20 1636   11/07/20 2200  clindamycin (CLEOCIN) IVPB 600 mg  Status:  Discontinued        600 mg 100 mL/hr over 30 Minutes Intravenous Every 8 hours 11/07/20 1924 11/08/20 0834   11/07/20 1930  vancomycin (VANCOCIN) IVPB 1000 mg/200 mL premix  Status:  Discontinued        1,000 mg 200 mL/hr over 60 Minutes Intravenous  Once 11/07/20 1924 11/07/20 2244   11/07/20 1930  piperacillin-tazobactam (ZOSYN) IVPB 3.375 g  Status:  Discontinued        3.375 g 100 mL/hr over 30 Minutes Intravenous  Once 11/07/20 1924 11/08/20 1636   11/07/20 1815  vancomycin (VANCOREADY) IVPB 2000 mg/400 mL  Status:  Discontinued        2,000 mg 200 mL/hr over 120 Minutes Intravenous STAT 11/07/20 1801 11/07/20 1802   11/07/20 1815  vancomycin (VANCOCIN) IVPB 1000 mg/200 mL premix  Status:  Discontinued        1,000 mg 200 mL/hr over 60 Minutes Intravenous STAT 11/07/20 1802 11/08/20 1258   11/07/20 1800  ceFEPIme (MAXIPIME) 2 g in sodium chloride 0.9 % 100 mL IVPB        2 g 200 mL/hr over 30 Minutes Intravenous STAT 11/07/20 1757 11/07/20 2303     Objective: Vitals: Today's Vitals   11/18/20 0738 11/18/20 0758 11/18/20 1100 11/18/20 1130  BP: Marland Kitchen)  182/95  (!) 166/81 (!) 142/82  Pulse: 72  84 76  Resp: _0 Temp: 98 F (36.7 C)  98.6 F (37 C) 98.6 F (37 C)  TempSrc:   Oral Oral  SpO2: 100%  100% 100%  Weight:       Height:      PainSc:  8       Intake/Output Summary (Last 24 hours) at 11/18/2020 1155 Last data filed at 11/18/2020 0600 Gross per 24 hour  Intake 1273.73 ml  Output 2000 ml  Net -726.27 ml   Filed Weights   11/14/20 0425 11/15/20 0344 11/17/20 0927  Weight: 106.7 kg 106.1 kg 104 kg   Weight change:   Intake/Output from previous day: 12/30 0701 - 12/31 0700 In: 1393.7 [P.O.:600; I.V.:132.9; IV Piggyback:660.9] Out: 2350 [Urine:2350] Intake/Output this shift: No intake/output data recorded.  Examination: General exam:AAOx3, pleasant,NAD,weak appearing. HEENT:Oral mucosa moist, Ear/Nose WNL grossly, dentition normal. Respiratory system: bilaterally clear, no wheezing or crackles,no use of accessory muscle. Cardiovascular system: S1 & S2 +, No JVD. Gastrointestinal system: Abdomen soft, NT,ND, BS+. Nervous System:Alert, awake, moving extremities and grossly non-focal. Extremities:No edema, left BKA dressing intact minimal oozing. Skin:No rashes,no icterus. WTU:UEKCMK muscle bulk,tone, power.  Data Reviewed: I have personally reviewed following labs and imaging studies CBC: Recent Labs  Lab 11/12/20 0531 11/13/20 0412 11/14/20 0419 11/15/20 0554 11/16/20 0432 11/17/20 0400 11/18/20 0432  WBC 17.7* 12.1* 15.3* 13.9* 11.6* 15.3* 10.3  NEUTROABS 15.8* 8.5*  --  10.4* 8.2*  --   --   HGB 8.8* 7.4* 7.7* 7.0* 7.5* 7.5* 6.9*  HCT 27.2* 24.3* 25.2* 23.2* 24.5* 23.7* 22.6*  MCV 84.5 87.1 87.5 87.5 88.1 86.2 88.6  PLT 330 320 346 382 408* 442* 349*   Basic Metabolic Panel: Recent Labs  Lab 11/14/20 0419 11/15/20 0554 11/16/20 0432 11/16/20 2200 11/17/20 0400 11/18/20 0432  NA 134* 135 136  --  132* 135  K 4.0 4.1 3.9  --  4.1 3.9  CL 104 102 101  --  100 103  CO2 _1 --  25 25  GLUCOSE 178* 242* 226* 552* 316* 276*  BUN _2 --  18 14  CREATININE 1.01 1.06 1.09  --  1.03 0.96  CALCIUM 8.0* 8.3* 8.4*  --  8.2* 8.0*   GFR: Estimated Creatinine  Clearance: 118.6 mL/min (by C-G formula based on SCr of 0.96 mg/dL). Liver Function Tests: No results for input(s): AST, ALT, ALKPHOS, BILITOT, PROT, ALBUMIN in the last 168 hours. No results for input(s): LIPASE, AMYLASE in the last 168 hours. No results for input(s): AMMONIA in the last 168 hours. Coagulation Profile: No results for input(s): INR, PROTIME in the last 168 hours. Cardiac Enzymes: No results for input(s): CKTOTAL, CKMB, CKMBINDEX, TROPONINI in the last 168 hours. BNP (last 3 results) No results for input(s): PROBNP in the last 8760 hours. HbA1C: No results for input(s): HGBA1C in the last 72 hours. CBG: Recent Labs  Lab 11/17/20 0737 11/17/20 1212 11/17/20 1635 11/17/20 2135 11/18/20 0739  GLUCAP 307* 230* 238* 269* 229*   Lipid Profile: No results for input(s): CHOL, HDL, LDLCALC, TRIG, CHOLHDL, LDLDIRECT in the last 72 hours. Thyroid Function Tests: No results for input(s): TSH, T4TOTAL, FREET4, T3FREE, THYROIDAB in the last 72 hours. Anemia Panel: No results for input(s): VITAMINB12, FOLATE, FERRITIN, TIBC, IRON, RETICCTPCT in the last 72 hours. Sepsis Labs: No results for input(s): PROCALCITON, LATICACIDVEN in the last 168 hours.  Recent Results (from the past 240 hour(s))  CULTURE, BLOOD (ROUTINE X 2) w Reflex to ID Panel     Status: None   Collection Time: 11/11/20 12:48 PM   Specimen: BLOOD  Result Value Ref Range Status   Specimen Description BLOOD BLOOD RIGHT HAND  Final   Special Requests   Final    BOTTLES DRAWN AEROBIC AND ANAEROBIC Blood Culture adequate volume   Culture   Final    NO GROWTH 5 DAYS Performed at [facility], [address] [place], [place] [address]    Report Status 11/16/2020 FINAL  Final  CULTURE, BLOOD (ROUTINE X 2) w Reflex to ID Panel     Status: None   Collection Time: 11/11/20  1:06 PM   Specimen: BLOOD  Result Value Ref Range Status   Specimen Description BLOOD LEFT ANTECUBITAL  Final   Special Requests    Final    BOTTLES DRAWN AEROBIC AND ANAEROBIC Blood Culture adequate volume   Culture   Final    NO GROWTH 5 DAYS Performed at [facility], [address] [place], [place] [address]    Report Status 11/16/2020 FINAL  Final     Radiology Studies: No results found.   LOS: 11 days   Antonieta Pert, MD [facility]  11/18/2020, 11:55 AM

## 2020-11-19 DIAGNOSIS — M726 Necrotizing fasciitis: Secondary | ICD-10-CM | POA: Diagnosis not present

## 2020-11-19 LAB — BPAM RBC
Blood Product Expiration Date: 202201012359
Blood Product Expiration Date: 202201272359
Blood Product Expiration Date: 202201292359
ISSUE DATE / TIME: 202112290504
ISSUE DATE / TIME: 202112292107
ISSUE DATE / TIME: 202112311108
Unit Type and Rh: 5100
Unit Type and Rh: 5100
Unit Type and Rh: 5100

## 2020-11-19 LAB — BASIC METABOLIC PANEL
Anion gap: 8 (ref 5–15)
BUN: 19 mg/dL (ref 6–20)
CO2: 24 mmol/L (ref 22–32)
Calcium: 8.3 mg/dL — ABNORMAL LOW (ref 8.9–10.3)
Chloride: 104 mmol/L (ref 98–111)
Creatinine, Ser: 1.13 mg/dL (ref 0.61–1.24)
GFR, Estimated: >60 mL/min (ref >60)
Glucose, Bld: 267 mg/dL — ABNORMAL HIGH (ref 70–99)
Potassium: 3.9 mmol/L (ref 3.5–5.1)
Sodium: 136 mmol/L (ref 135–145)

## 2020-11-19 LAB — TYPE AND SCREEN
ABO/RH(D): O POS
Antibody Screen: NEGATIVE
Unit division: 0
Unit division: 0
Unit division: 0

## 2020-11-19 LAB — GLUCOSE, CAPILLARY
Glucose-Capillary: 237 mg/dL — ABNORMAL HIGH (ref 70–99)
Glucose-Capillary: 245 mg/dL — ABNORMAL HIGH (ref 70–99)
Glucose-Capillary: 197 mg/dL — ABNORMAL HIGH (ref 70–99)
Glucose-Capillary: 224 mg/dL — ABNORMAL HIGH (ref 70–99)

## 2020-11-19 LAB — CBC
HCT: 23.7 % — ABNORMAL LOW (ref 39.0–52.0)
Hemoglobin: 7.6 g/dL — ABNORMAL LOW (ref 13.0–17.0)
MCH: 27.5 pg (ref 26.0–34.0)
MCHC: 32.1 g/dL (ref 30.0–36.0)
MCV: 85.9 fL (ref 80.0–100.0)
Platelets: 469 10*3/uL — ABNORMAL HIGH (ref 150–400)
RBC: 2.76 MIL/uL — ABNORMAL LOW (ref 4.22–5.81)
RDW: 16.9 % — ABNORMAL HIGH (ref 11.5–15.5)
WBC: 10.2 10*3/uL (ref 4.0–10.5)
nRBC: 0 % (ref 0.0–0.2)

## 2020-11-19 MED ORDER — OXYCODONE HCL 5 MG PO TABS
5.0000 mg | ORAL_TABLET | ORAL | Status: DC | PRN
  Administered 2020-11-19 – 2020-11-21 (×11): 5 mg via ORAL
  Filled 2020-11-19 (×11): qty 1

## 2020-11-19 MED ORDER — LISINOPRIL 20 MG PO TABS
20.0000 mg | ORAL_TABLET | Freq: Every day | ORAL | Status: DC
  Administered 2020-11-19 – 2020-11-21 (×3): 20 mg via ORAL
  Filled 2020-11-19 (×3): qty 1

## 2020-11-19 NOTE — Progress Notes (Addendum)
Subjective  - POD #3  Pain much better controlled today Received 1 unit of blood yesterday  Physical Exam:  Dressing remains clean dry and intact       Assessment/Plan:  POD #3  -Plan is for discharge home with home health.  -Will need pain medicine at discharge.  He missed his pain clinic appointment due to his surgery, and does not have any extra supply at home.   This will have to be cleared through the pain clinic.  -1 month follow-up for staple removal  -Receiving antibiotics per ID  -  Richard Mendoza 11/19/2020 7:06 PM --  Vitals:   11/19/20 1209 11/19/20 1601  BP: (!) 148/92 (!) 167/89  Pulse: 82 82  Resp: 17 17  Temp: 98.1 F (36.7 C) 98.1 F (36.7 C)  SpO2: 100% 99%    Intake/Output Summary (Last 24 hours) at 11/19/2020 1906 Last data filed at 11/19/2020 1511 Gross per 24 hour  Intake --  Output 950 ml  Net -950 ml     Laboratory CBC    Component Value Date/Time   WBC 10.2 11/19/2020 0425   HGB 7.6 (L) 11/19/2020 0425   HCT 23.7 (L) 11/19/2020 0425   PLT 469 (H) 11/19/2020 0425    BMET    Component Value Date/Time   NA 136 11/19/2020 0425   K 3.9 11/19/2020 0425   CL 104 11/19/2020 0425   CO2 24 11/19/2020 0425   GLUCOSE 267 (H) 11/19/2020 0425   BUN 19 11/19/2020 0425   CREATININE 1.13 11/19/2020 0425   CALCIUM 8.3 (L) 11/19/2020 0425   GFRNONAA >60 11/19/2020 0425   GFRAA >60 05/05/2020 1930    COAG Lab Results  Component Value Date   INR 1.2 11/07/2020   INR 1.07 11/26/2018   No results found for: PTT  Antibiotics Anti-infectives (From admission, onward)   Start     Dose/Rate Route Frequency Ordered Stop   11/11/20 2222  vancomycin (VANCOCIN) powder  Status:  Discontinued          As needed 11/11/20 2222 11/11/20 2310   11/11/20 1600  Ampicillin-Sulbactam (UNASYN) 3 g in sodium chloride 0.9 % 100 mL IVPB        3 g 200 mL/hr over 30 Minutes Intravenous Every 6 hours 11/11/20 1508     11/11/20 0800  cefTRIAXone  (ROCEPHIN) 2 g in sodium chloride 0.9 % 100 mL IVPB  Status:  Discontinued        2 g 200 mL/hr over 30 Minutes Intravenous Every 24 hours 11/11/20 0122 11/11/20 1506   11/09/20 0600  ceFEPIme (MAXIPIME) 2 g in sodium chloride 0.9 % 100 mL IVPB  Status:  Discontinued        2 g 200 mL/hr over 30 Minutes Intravenous Every 8 hours 11/09/20 0009 11/11/20 0122   11/09/20 0000  metroNIDAZOLE (FLAGYL) IVPB 500 mg  Status:  Discontinued        500 mg 100 mL/hr over 60 Minutes Intravenous Every 8 hours 11/08/20 1954 11/11/20 1507   11/08/20 2200  ceFEPIme (MAXIPIME) 2 g in sodium chloride 0.9 % 100 mL IVPB  Status:  Discontinued        2 g 200 mL/hr over 30 Minutes Intravenous Every 12 hours 11/08/20 1952 11/09/20 0009   11/08/20 1800  vancomycin (VANCOREADY) IVPB 1250 mg/250 mL  Status:  Discontinued        1,250 mg 166.7 mL/hr over 90 Minutes Intravenous Every 24 hours 11/07/20 1939 11/08/20  1258   11/08/20 1800  Ampicillin-Sulbactam (UNASYN) 3 g in sodium chloride 0.9 % 100 mL IVPB  Status:  Discontinued        3 g 200 mL/hr over 30 Minutes Intravenous Every 6 hours 11/08/20 1636 11/08/20 1951   11/08/20 1400  vancomycin (VANCOREADY) IVPB 750 mg/150 mL  Status:  Discontinued        750 mg 150 mL/hr over 60 Minutes Intravenous Every 12 hours 11/08/20 1258 11/10/20 1030   11/08/20 0800  piperacillin-tazobactam (ZOSYN) IVPB 3.375 g  Status:  Discontinued        3.375 g 12.5 mL/hr over 240 Minutes Intravenous Every 8 hours 11/07/20 1939 11/08/20 1636   11/07/20 2200  clindamycin (CLEOCIN) IVPB 600 mg  Status:  Discontinued        600 mg 100 mL/hr over 30 Minutes Intravenous Every 8 hours 11/07/20 1924 11/08/20 0834   11/07/20 1930  vancomycin (VANCOCIN) IVPB 1000 mg/200 mL premix  Status:  Discontinued        1,000 mg 200 mL/hr over 60 Minutes Intravenous  Once 11/07/20 1924 11/07/20 2244   11/07/20 1930  piperacillin-tazobactam (ZOSYN) IVPB 3.375 g  Status:  Discontinued        3.375 g 100  mL/hr over 30 Minutes Intravenous  Once 11/07/20 1924 11/08/20 1636   11/07/20 1815  vancomycin (VANCOREADY) IVPB 2000 mg/400 mL  Status:  Discontinued        2,000 mg 200 mL/hr over 120 Minutes Intravenous STAT 11/07/20 1801 11/07/20 1802   11/07/20 1815  vancomycin (VANCOCIN) IVPB 1000 mg/200 mL premix  Status:  Discontinued        1,000 mg 200 mL/hr over 60 Minutes Intravenous STAT 11/07/20 1802 11/08/20 1258   11/07/20 1800  ceFEPIme (MAXIPIME) 2 g in sodium chloride 0.9 % 100 mL IVPB        2 g 200 mL/hr over 30 Minutes Intravenous STAT 11/07/20 1757 11/07/20 2303       V. Charlena Cross, M.D., [facility] [place] Office: [phone] Pager:  [phone]

## 2020-11-19 NOTE — Progress Notes (Signed)
PROGRESS NOTE    Richard Mendoza  PFY:[id number] DOB: 02-06-1976 DOA: 11/07/2020 PCP: Tracie Harrier, MD   Brief Narrative: 45 year old male with type II uncontrolled diabetes on insulin pump, hypertension, dyslipidemia presents to the ED with left foot infection acute, worsening, was referred by podiatrist.  Patient endorsed uncontrolled blood sugar level, also nausea, fever and chills at home.  He was on doxycycline by Dr. Vickki Muff but infection was not improving. He in the ED he was febrile and lab with a leukocytosis, COVID-19 negative, left foot x-ray diffuse subcutaneous gas along the plantar foot extending to the third toe concerning for necrotizing fasciitis, patient was seen by Dr. 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 Hemoglobin improved to 7.6 g. 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  Appreciate ID input monitor how the wound is healing, patient having bleeding from the areas dressing being changed by vascular, PT OT. Cont IV UNASYN -for now plan is to continue IV antibiotics until clearance from vascular to stop antibiotics because of hematoma at the surgical site per iD recommendations.Leukocytosis resolved and is afebrile.Pain control with opiates/nonopioids, toradol with monitoring of renal function closely-minimize  opiate use.  Is normally on Oxy 5 every 6 hour at home  Group B Streptococcus sepsis POA also Prevotella bacteremia in anaerobic culture: Patient met sepsis criteria leukocytosis 11.8, fever 102 on admission.  Antibiotic plan as per above.   Acute blood loss anemia in the setting of anemia from chronic disease: Blood loss in the setting of amputation/on Coumadin therapy.Received 2 units total - on 12/29 and 12/31. H/H stable. Monitor.check FOBT. Recent Labs  Lab 11/16/20 0432 11/17/20 0400 11/18/20 0432 11/18/20 1420 11/19/20 0425  HGB 7.5* 7.5* 6.9* 7.8* 7.6*  HCT 24.5* 23.7* 22.6* 25.2* 23.7*   GERD on PPI  Hypertension:BP borderline controlled likely multifactorial postop, pain issues.  Increase home lisinopril to 20 mg.   Diabetes mellitus on insulin pump at home, poorly controlled/nonketotic hyperosmolar hyperglycemia: Last hemoglobin A1c 12, off insulin drip.  Blood sugar fluctuating, also received steroid postop. sugar 140-230. Keep on Lantus increased to 85 units 12/31,  and premeal insulin to 10 units and sliding scale insulin.  He is on Reglan.  DM coordinator following.  Monitor. Recent Labs  Lab 11/18/20 0739 11/18/20 1210 11/18/20 1630 11/18/20 2007 11/19/20 0749  GLUCAP 229* 140* 174* 231* 237*   CKD IIIa function stable.  Monitor while getting NSAIDs Recent Labs  Lab 11/15/20 0554 11/16/20 0432 11/17/20 0400 11/18/20 0432 11/19/20 0425  BUN _0 CREATININE 1.06 1.09 1.03 0.96 1.13   Hyperlipidemia continue statin  Low back pain history of right paracentral disc protrusion at L5-S1, CT L-spine no acute finding, continue pain control. B12 deficiency on B12 supplement Peripheral neuropathy/chronic pain, on Neurontin.  Takes Vicodin at home as well. Headache, migraine type retro-orbital but no history of migraine, Imitrex 25  mg p.o. every 2 hours as needed consider subcommittees versus Fioricet if ineffective. Hyponatremia likely pseudohyponatremia  from hyperglycemia.  Monitor Hypokalemia resolved Anxiety disorder: he is on BuSpar and Zoloft. Morbid Obesity weight body mass index is 33.55 kg/m.  Will benefit with weight loss.  Nutrition: Diet Order            Diet Carb Modified Fluid consistency: Thin; Room service appropriate? Yes  Diet effective now                 DVT prophylaxis: Lovenox Code Status:   Code Status: Full Code  Family Communication: plan of care discussed with patient and his wife at bedside.  Status is: Inpatient  Remains inpatient appropriate because:IV treatments appropriate due to intensity of illness or inability to take PO, Inpatient level of care appropriate due to severity of illness and ongoing surgical need   Dispo: The patient is from: Home              Anticipated d/c is to: Plan for home health, PT working with him.               Anticipated d/c date is: 1-2 days once cleared by ID/vascular              Patient currently is not medically stable to d/c.   Consultants: Vascular surgery, infectious disease. Procedures:see note  Culture/Microbiology    Component Value Date/Time   SDES BLOOD LEFT ANTECUBITAL 11/11/2020 1306   SPECREQUEST  11/11/2020 1306    BOTTLES DRAWN AEROBIC AND ANAEROBIC Blood Culture adequate volume   CULT  11/11/2020 1306    NO GROWTH 5 DAYS Performed at [facility], [address] [place], [place] [address]    REPTSTATUS 11/16/2020 FINAL 11/11/2020 1306    Other culture-see note  Medications: Scheduled Meds: . sodium chloride   Intravenous Once  . acetaminophen  650 mg Oral Q6H  . atorvastatin  40 mg Oral Daily  . busPIRone  15 mg Oral BID  . Chlorhexidine Gluconate Cloth  6 each Topical Daily  . dicyclomine  10 mg Oral TID AC & HS  . enoxaparin (LOVENOX) injection  50 mg Subcutaneous Daily  . famotidine  20 mg Oral BID  . ferrous sulfate  325 mg Oral BID WC  . gabapentin  600 mg Oral QHS  . gabapentin  300 mg Oral BID  . insulin aspart   0-20 Units Subcutaneous TID AC & HS  . insulin aspart  10 Units Subcutaneous TID WC  . insulin glargine  85 Units Subcutaneous Daily  . ketorolac  30 mg Intravenous Q6H  . lidocaine  1 patch Transdermal Q24H  . lisinopril  10 mg Oral Daily  . methocarbamol  750 mg Oral QID  . metoCLOPramide  10 mg Oral TID AC  . multivitamin with minerals  1 tablet Oral Daily  . oxyCODONE  10 mg Oral Q12H  . pantoprazole  40 mg Oral Daily  . sertraline  100 mg Oral Daily  . vitamin B-12  500 mcg Oral Daily   Continuous Infusions: . sodium chloride Stopped (11/13/20 0515)  . ampicillin-sulbactam (UNASYN) IV 3 g (11/19/20 6803)    Antimicrobials: Anti-infectives (From admission, onward)   Start     Dose/Rate Route Frequency Ordered Stop   11/11/20 2222  vancomycin (VANCOCIN) powder  Status:  Discontinued          As needed 11/11/20 2222 11/11/20 2310   11/11/20 1600  Ampicillin-Sulbactam (UNASYN)  3 g in sodium chloride 0.9 % 100 mL IVPB        3 g 200 mL/hr over 30 Minutes Intravenous Every 6 hours 11/11/20 1508     11/11/20 0800  cefTRIAXone (ROCEPHIN) 2 g in sodium chloride 0.9 % 100 mL IVPB  Status:  Discontinued        2 g 200 mL/hr over 30 Minutes Intravenous Every 24 hours 11/11/20 0122 11/11/20 1506   11/09/20 0600  ceFEPIme (MAXIPIME) 2 g in sodium chloride 0.9 % 100 mL IVPB  Status:  Discontinued        2 g 200 mL/hr over 30 Minutes Intravenous Every 8 hours 11/09/20 0009 11/11/20 0122   11/09/20 0000  metroNIDAZOLE (FLAGYL) IVPB 500 mg  Status:  Discontinued        500 mg 100 mL/hr over 60 Minutes Intravenous Every 8 hours 11/08/20 1954 11/11/20 1507   11/08/20 2200  ceFEPIme (MAXIPIME) 2 g in sodium chloride 0.9 % 100 mL IVPB  Status:  Discontinued        2 g 200 mL/hr over 30 Minutes Intravenous Every 12 hours 11/08/20 1952 11/09/20 0009   11/08/20 1800  vancomycin (VANCOREADY) IVPB 1250 mg/250 mL  Status:  Discontinued        1,250 mg 166.7 mL/hr over 90 Minutes Intravenous Every  24 hours 11/07/20 1939 11/08/20 1258   11/08/20 1800  Ampicillin-Sulbactam (UNASYN) 3 g in sodium chloride 0.9 % 100 mL IVPB  Status:  Discontinued        3 g 200 mL/hr over 30 Minutes Intravenous Every 6 hours 11/08/20 1636 11/08/20 1951   11/08/20 1400  vancomycin (VANCOREADY) IVPB 750 mg/150 mL  Status:  Discontinued        750 mg 150 mL/hr over 60 Minutes Intravenous Every 12 hours 11/08/20 1258 11/10/20 1030   11/08/20 0800  piperacillin-tazobactam (ZOSYN) IVPB 3.375 g  Status:  Discontinued        3.375 g 12.5 mL/hr over 240 Minutes Intravenous Every 8 hours 11/07/20 1939 11/08/20 1636   11/07/20 2200  clindamycin (CLEOCIN) IVPB 600 mg  Status:  Discontinued        600 mg 100 mL/hr over 30 Minutes Intravenous Every 8 hours 11/07/20 1924 11/08/20 0834   11/07/20 1930  vancomycin (VANCOCIN) IVPB 1000 mg/200 mL premix  Status:  Discontinued        1,000 mg 200 mL/hr over 60 Minutes Intravenous  Once 11/07/20 1924 11/07/20 2244   11/07/20 1930  piperacillin-tazobactam (ZOSYN) IVPB 3.375 g  Status:  Discontinued        3.375 g 100 mL/hr over 30 Minutes Intravenous  Once 11/07/20 1924 11/08/20 1636   11/07/20 1815  vancomycin (VANCOREADY) IVPB 2000 mg/400 mL  Status:  Discontinued        2,000 mg 200 mL/hr over 120 Minutes Intravenous STAT 11/07/20 1801 11/07/20 1802   11/07/20 1815  vancomycin (VANCOCIN) IVPB 1000 mg/200 mL premix  Status:  Discontinued        1,000 mg 200 mL/hr over 60 Minutes Intravenous STAT 11/07/20 1802 11/08/20 1258   11/07/20 1800  ceFEPIme (MAXIPIME) 2 g in sodium chloride 0.9 % 100 mL IVPB        2 g 200 mL/hr over 30 Minutes Intravenous STAT 11/07/20 1757 11/07/20 2303     Objective: Vitals: Today's Vitals   11/19/20 0453 11/19/20 0600 11/19/20 0748 11/19/20 0802  BP: (!) 179/94 (!) 169/71 (!) 162/92   Pulse: 65  74  Resp: 17  17   Temp: 97.8 F (36.6 C)  98.1 F (36.7 C)   TempSrc:      SpO2: 100%  99%   Weight:      Height:      PainSc:     Asleep    Intake/Output Summary (Last 24 hours) at 11/19/2020 0809 Last data filed at 11/18/2020 1638 Gross per 24 hour  Intake 489.85 ml  Output 900 ml  Net -410.15 ml   Filed Weights   11/14/20 0425 11/15/20 0344 11/17/20 0927  Weight: 106.7 kg 106.1 kg 104 kg   Weight change:   Intake/Output from previous day: 12/31 0701 - 01/01 0700 In: 489.9 [I.V.:39.8; Blood:310.8; IV Piggyback:139.2] Out: 900 [Urine:900] Intake/Output this shift: No intake/output data recorded.  Examination: General exam: AAOx3 , NAD, weak appearing. HEENT:Oral mucosa moist, Ear/Nose WNL grossly, dentition normal. Respiratory system: bilaterally clear,no wheezing or crackles,no use of accessory muscle Cardiovascular system: S1 & S2 +, No JVD,. Gastrointestinal system: Abdomen soft, NT,ND, BS+ Nervous System:Alert, awake, moving extremities and grossly nonfocal Extremities:  Lt BKA with stump in ace wrap. Skin: No rashes,no icterus. MSK: Normal muscle bulk,tone, power  Data Reviewed: I have personally reviewed following labs and imaging studies CBC: Recent Labs  Lab 11/13/20 0412 11/14/20 0419 11/15/20 0554 11/16/20 0432 11/17/20 0400 11/18/20 0432 11/18/20 1420 11/19/20 0425  WBC 12.1*   < > 13.9* 11.6* 15.3* 10.3  --  10.2  NEUTROABS 8.5*  --  10.4* 8.2*  --   --   --   --   HGB 7.4*   < > 7.0* 7.5* 7.5* 6.9* 7.8* 7.6*  HCT 24.3*   < > 23.2* 24.5* 23.7* 22.6* 25.2* 23.7*  MCV 87.1   < > 87.5 88.1 86.2 88.6  --  85.9  PLT 320   < > 382 408* 442* 452*  --  469*   < > = values in this interval not displayed.   Basic Metabolic Panel: Recent Labs  Lab 11/15/20 0554 11/16/20 0432 11/16/20 2200 11/17/20 0400 11/18/20 0432 11/19/20 0425  NA 135 136  --  132* 135 136  K 4.1 3.9  --  4.1 3.9 3.9  CL 102 101  --  100 103 104  CO2 26 25  --  _0 GLUCOSE 242* 226* 552* 316* 276* 267*  BUN 10 13  --  _1 CREATININE 1.06 1.09  --  1.03 0.96 1.13  CALCIUM 8.3* 8.4*  --  8.2* 8.0*  8.3*   GFR: Estimated Creatinine Clearance: 100.8 mL/min (by C-G formula based on SCr of 1.13 mg/dL). Liver Function Tests: No results for input(s): AST, ALT, ALKPHOS, BILITOT, PROT, ALBUMIN in the last 168 hours. No results for input(s): LIPASE, AMYLASE in the last 168 hours. No results for input(s): AMMONIA in the last 168 hours. Coagulation Profile: No results for input(s): INR, PROTIME in the last 168 hours. Cardiac Enzymes: No results for input(s): CKTOTAL, CKMB, CKMBINDEX, TROPONINI in the last 168 hours. BNP (last 3 results) No results for input(s): PROBNP in the last 8760 hours. HbA1C: No results for input(s): HGBA1C in the last 72 hours. CBG: Recent Labs  Lab 11/18/20 0739 11/18/20 1210 11/18/20 1630 11/18/20 2007 11/19/20 0749  GLUCAP 229* 140* 174* 231* 237*   Lipid Profile: No results for input(s): CHOL, HDL, LDLCALC, TRIG, CHOLHDL, LDLDIRECT in the last 72 hours. Thyroid Function Tests: No results for input(s): TSH, T4TOTAL, FREET4, T3FREE, THYROIDAB in the last  72 hours. Anemia Panel: No results for input(s): VITAMINB12, FOLATE, FERRITIN, TIBC, IRON, RETICCTPCT in the last 72 hours. Sepsis Labs: No results for input(s): PROCALCITON, LATICACIDVEN in the last 168 hours.  Recent Results (from the past 240 hour(s))  CULTURE, BLOOD (ROUTINE X 2) w Reflex to ID Panel     Status: None   Collection Time: 11/11/20 12:48 PM   Specimen: BLOOD  Result Value Ref Range Status   Specimen Description BLOOD BLOOD RIGHT HAND  Final   Special Requests   Final    BOTTLES DRAWN AEROBIC AND ANAEROBIC Blood Culture adequate volume   Culture   Final    NO GROWTH 5 DAYS Performed at [facility], [address] [place], [place] [address]    Report Status 11/16/2020 FINAL  Final  CULTURE, BLOOD (ROUTINE X 2) w Reflex to ID Panel     Status: None   Collection Time: 11/11/20  1:06 PM   Specimen: BLOOD  Result Value Ref Range Status   Specimen Description BLOOD LEFT  ANTECUBITAL  Final   Special Requests   Final    BOTTLES DRAWN AEROBIC AND ANAEROBIC Blood Culture adequate volume   Culture   Final    NO GROWTH 5 DAYS Performed at [facility], [address], [place], [place] [address]    Report Status 11/16/2020 FINAL  Final     Radiology Studies: No results found.   LOS: 12 days   Antonieta Pert, MD [facility]  11/19/2020, 8:09 AM

## 2020-11-19 NOTE — Progress Notes (Signed)
Physical Therapy Treatment Patient Details Name: Richard Mendoza MRN: [id number] DOB: [birth year] Today's Date: 11/19/2020    History of Present Illness Patient is a 45 year old male who presents to ER with acute onset of worsening left foot infection being referred by his podiatrist. Upon arrival blood glucose is significantly elevated as well as temperature and HR.  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 PMH includes type II uncontrolled DM on insulin pump, HTN, dyslipidemia, stage IIIa CKD,    PT Comments    OOB and ambulated 50' with RW and min guard/supervision.  Generally steady with no LOB.  Standing ex and education after gait.  Attempted to put chair alarm on but pt refused.  Stated he has been up walking to/from bathroom with walker on his own and at times pushing IV pole with him.  Education provided regarding safety and request for him to call staff for assist.  Pt aware of risks with ind amb in room including falls and risk of need for surgical intervention if incision is damaged.  Voices understanding but again stated he will be up on his own if he chooses.  Path to bathroom is cleared of obstacles to make it as safe as possible since he has made his choice clear in regards to his mobility.  RN in room and in for discussion.  He is again encouraged to call for assist when up.   Follow Up Recommendations  Home health PT;Supervision for mobility/OOB     Equipment Recommendations  Rolling walker with 5" wheels;3in1 (PT)    Recommendations for Other Services       Precautions / Restrictions Precautions Precautions: Fall Restrictions Weight Bearing Restrictions: Yes LLE Weight Bearing: Non weight bearing    Mobility  Bed Mobility Overal bed mobility: Modified Independent                Transfers Overall transfer level: Needs assistance Equipment used: Rolling walker (2 wheeled) Transfers: Sit to/from Stand Sit to Stand: Supervision             Ambulation/Gait   Gait Distance (Feet): 50 Feet Assistive device: Rolling walker (2 wheeled)   Gait velocity: decreased   General Gait Details: hop to pattern.  self seleced distance and speed.  Cues not to hop too far into walker   Stairs             Wheelchair Mobility    Modified Rankin (Stroke Patients Only)       Balance Overall balance assessment: Needs assistance Sitting-balance support: No upper extremity supported;Feet unsupported Sitting balance-Leahy Scale: Good Sitting balance - Comments: Patient able to bend and reach without LOB   Standing balance support: During functional activity;Bilateral upper extremity supported Standing balance-Leahy Scale: Fair Standing balance comment: Patient requires use of RW for ambulation due to recent amputation, requires SUE support for static balance.                            Cognition Arousal/Alertness: Awake/alert Behavior During Therapy: WFL for tasks assessed/performed Overall Cognitive Status: Within Functional Limits for tasks assessed                                        Exercises Other Exercises Other Exercises: standing ex with empahsis and education on hip extension.  doing HEP on his own without difficulty in  supine    General Comments        Pertinent Vitals/Pain Pain Assessment: 0-10 Pain Score: 6  Pain Location: residual limb Pain Descriptors / Indicators: Aching;Sore Pain Intervention(s): Limited activity within patient's tolerance;Monitored during session;Repositioned    Home Living                      Prior Function            PT Goals (current goals can now be found in the care plan section) Progress towards PT goals: Progressing toward goals    Frequency    7X/week      PT Plan Current plan remains appropriate    Co-evaluation              AM-PAC PT "6 Clicks" Mobility   Outcome Measure  Help needed turning from your  back to your side while in a flat bed without using bedrails?: None Help needed moving from lying on your back to sitting on the side of a flat bed without using bedrails?: None Help needed moving to and from a bed to a chair (including a wheelchair)?: A Little Help needed standing up from a chair using your arms (e.g., wheelchair or bedside chair)?: A Little Help needed to walk in hospital room?: A Little Help needed climbing 3-5 steps with a railing? : A Little 6 Click Score: 20    End of Session Equipment Utilized During Treatment: Gait belt Activity Tolerance: Patient tolerated treatment well Patient left: in chair;with nursing/sitter in room;with call bell/phone within reach Nurse Communication: Mobility status Pain - Right/Left: Left Pain - part of body: Leg     Time: 6629-4765 PT Time Calculation (min) (ACUTE ONLY): 11 min  Charges:  $Gait Training: 8-22 mins                    Danielle Dess, PTA 11/19/20, 9:20 AM

## 2020-11-20 DIAGNOSIS — M726 Necrotizing fasciitis: Secondary | ICD-10-CM | POA: Diagnosis not present

## 2020-11-20 DIAGNOSIS — Z89512 Acquired absence of left leg below knee: Secondary | ICD-10-CM

## 2020-11-20 LAB — CBC
HCT: 23.8 % — ABNORMAL LOW (ref 39.0–52.0)
Hemoglobin: 7.3 g/dL — ABNORMAL LOW (ref 13.0–17.0)
MCH: 26.8 pg (ref 26.0–34.0)
MCHC: 30.7 g/dL (ref 30.0–36.0)
MCV: 87.5 fL (ref 80.0–100.0)
Platelets: 451 10*3/uL — ABNORMAL HIGH (ref 150–400)
RBC: 2.72 MIL/uL — ABNORMAL LOW (ref 4.22–5.81)
RDW: 16.5 % — ABNORMAL HIGH (ref 11.5–15.5)
WBC: 9.1 10*3/uL (ref 4.0–10.5)
nRBC: 0 % (ref 0.0–0.2)

## 2020-11-20 LAB — BASIC METABOLIC PANEL
Anion gap: 8 (ref 5–15)
BUN: 24 mg/dL — ABNORMAL HIGH (ref 6–20)
CO2: 24 mmol/L (ref 22–32)
Calcium: 8.3 mg/dL — ABNORMAL LOW (ref 8.9–10.3)
Chloride: 106 mmol/L (ref 98–111)
Creatinine, Ser: 1.25 mg/dL — ABNORMAL HIGH (ref 0.61–1.24)
GFR, Estimated: >60 mL/min (ref >60)
Glucose, Bld: 281 mg/dL — ABNORMAL HIGH (ref 70–99)
Potassium: 4 mmol/L (ref 3.5–5.1)
Sodium: 138 mmol/L (ref 135–145)

## 2020-11-20 LAB — GLUCOSE, CAPILLARY
Glucose-Capillary: 287 mg/dL — ABNORMAL HIGH (ref 70–99)
Glucose-Capillary: 270 mg/dL — ABNORMAL HIGH (ref 70–99)
Glucose-Capillary: 132 mg/dL — ABNORMAL HIGH (ref 70–99)
Glucose-Capillary: 215 mg/dL — ABNORMAL HIGH (ref 70–99)

## 2020-11-20 MED ORDER — SODIUM CHLORIDE 0.9 % IV SOLN: INTRAVENOUS | Status: DC

## 2020-11-20 NOTE — Progress Notes (Signed)
Subjective  - POD #4  Pain is well controlled   Physical Exam:  Dressing changed today.  The incision remains viable with no areas of skin compromise       Assessment/Plan:  POD #4  Patient plans to go home at discharge.  He will need a follow-up appointment for prosthetic evaluation and stump shrinker.  He has a pain contract, and his narcotic needs will need to be addressed prior to discharge, as he missed his pain clinic appointment due to his surgery  Richard Mendoza 11/20/2020 4:27 PM --  Vitals:   11/20/20 1153 11/20/20 1610  BP: (!) 159/86 (!) 159/91  Pulse: 81 79  Resp: 17 18  Temp: 98.4 F (36.9 C) 97.8 F (36.6 C)  SpO2: 99% 98%   No intake or output data in the 24 hours ending 11/20/20 1627   Laboratory CBC    Component Value Date/Time   WBC 9.1 11/20/2020 0427   HGB 7.3 (L) 11/20/2020 0427   HCT 23.8 (L) 11/20/2020 0427   PLT 451 (H) 11/20/2020 0427    BMET    Component Value Date/Time   NA 138 11/20/2020 0427   K 4.0 11/20/2020 0427   CL 106 11/20/2020 0427   CO2 24 11/20/2020 0427   GLUCOSE 281 (H) 11/20/2020 0427   BUN 24 (H) 11/20/2020 0427   CREATININE 1.25 (H) 11/20/2020 0427   CALCIUM 8.3 (L) 11/20/2020 0427   GFRNONAA >60 11/20/2020 0427   GFRAA >60 05/05/2020 1930    COAG Lab Results  Component Value Date   INR 1.2 11/07/2020   INR 1.07 11/26/2018   No results found for: PTT  Antibiotics Anti-infectives (From admission, onward)   Start     Dose/Rate Route Frequency Ordered Stop   11/11/20 2222  vancomycin (VANCOCIN) powder  Status:  Discontinued          As needed 11/11/20 2222 11/11/20 2310   11/11/20 1600  Ampicillin-Sulbactam (UNASYN) 3 g in sodium chloride 0.9 % 100 mL IVPB        3 g 200 mL/hr over 30 Minutes Intravenous Every 6 hours 11/11/20 1508     11/11/20 0800  cefTRIAXone (ROCEPHIN) 2 g in sodium chloride 0.9 % 100 mL IVPB  Status:  Discontinued        2 g 200 mL/hr over 30 Minutes Intravenous Every 24  hours 11/11/20 0122 11/11/20 1506   11/09/20 0600  ceFEPIme (MAXIPIME) 2 g in sodium chloride 0.9 % 100 mL IVPB  Status:  Discontinued        2 g 200 mL/hr over 30 Minutes Intravenous Every 8 hours 11/09/20 0009 11/11/20 0122   11/09/20 0000  metroNIDAZOLE (FLAGYL) IVPB 500 mg  Status:  Discontinued        500 mg 100 mL/hr over 60 Minutes Intravenous Every 8 hours 11/08/20 1954 11/11/20 1507   11/08/20 2200  ceFEPIme (MAXIPIME) 2 g in sodium chloride 0.9 % 100 mL IVPB  Status:  Discontinued        2 g 200 mL/hr over 30 Minutes Intravenous Every 12 hours 11/08/20 1952 11/09/20 0009   11/08/20 1800  vancomycin (VANCOREADY) IVPB 1250 mg/250 mL  Status:  Discontinued        1,250 mg 166.7 mL/hr over 90 Minutes Intravenous Every 24 hours 11/07/20 1939 11/08/20 1258   11/08/20 1800  Ampicillin-Sulbactam (UNASYN) 3 g in sodium chloride 0.9 % 100 mL IVPB  Status:  Discontinued  3 g 200 mL/hr over 30 Minutes Intravenous Every 6 hours 11/08/20 1636 11/08/20 1951   11/08/20 1400  vancomycin (VANCOREADY) IVPB 750 mg/150 mL  Status:  Discontinued        750 mg 150 mL/hr over 60 Minutes Intravenous Every 12 hours 11/08/20 1258 11/10/20 1030   11/08/20 0800  piperacillin-tazobactam (ZOSYN) IVPB 3.375 g  Status:  Discontinued        3.375 g 12.5 mL/hr over 240 Minutes Intravenous Every 8 hours 11/07/20 1939 11/08/20 1636   11/07/20 2200  clindamycin (CLEOCIN) IVPB 600 mg  Status:  Discontinued        600 mg 100 mL/hr over 30 Minutes Intravenous Every 8 hours 11/07/20 1924 11/08/20 0834   11/07/20 1930  vancomycin (VANCOCIN) IVPB 1000 mg/200 mL premix  Status:  Discontinued        1,000 mg 200 mL/hr over 60 Minutes Intravenous  Once 11/07/20 1924 11/07/20 2244   11/07/20 1930  piperacillin-tazobactam (ZOSYN) IVPB 3.375 g  Status:  Discontinued        3.375 g 100 mL/hr over 30 Minutes Intravenous  Once 11/07/20 1924 11/08/20 1636   11/07/20 1815  vancomycin (VANCOREADY) IVPB 2000 mg/400 mL   Status:  Discontinued        2,000 mg 200 mL/hr over 120 Minutes Intravenous STAT 11/07/20 1801 11/07/20 1802   11/07/20 1815  vancomycin (VANCOCIN) IVPB 1000 mg/200 mL premix  Status:  Discontinued        1,000 mg 200 mL/hr over 60 Minutes Intravenous STAT 11/07/20 1802 11/08/20 1258   11/07/20 1800  ceFEPIme (MAXIPIME) 2 g in sodium chloride 0.9 % 100 mL IVPB        2 g 200 mL/hr over 30 Minutes Intravenous STAT 11/07/20 1757 11/07/20 2303       V. Charlena Cross, M.D., [facility] [place] Office: [phone] Pager:  [phone]

## 2020-11-20 NOTE — Progress Notes (Addendum)
PROGRESS NOTE    Richard Mendoza  KZL:[id number] DOB: [birth year] DOA: 11/07/2020 PCP: Tracie Harrier, MD   Brief Narrative: 45 year old male with type II uncontrolled diabetes on insulin pump, hypertension, dyslipidemia presents to the ED with left foot infection acute, worsening, was referred by podiatrist.  Patient endorsed uncontrolled blood sugar level, also nausea, fever and chills at home.  He was on doxycycline by Dr. Vickki Muff but infection was not improving. He in the ED he was febrile and lab with a leukocytosis, COVID-19 negative, left foot x-ray diffuse subcutaneous gas along the plantar foot extending to the third toe concerning for necrotizing fasciitis, patient was seen by Dr. 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  Patient had a lot of pain postop, now pain controlled with Toradol and oxy. His hemoglobin had dropped and needing 1 unit PRBC transfusion on 12/29 and 12/31.  Subjective: Seen this morning.  Pain is controlled.  H&H drifting low.  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  Appreciate ID input monitor how the wound is healing, patient having bleeding from the areas dressing being changed by vascular, PT OT. Cont IV UNASYN -for now plan is to continue IV antibiotics until clearance from vascular to stop antibiotics because of hematoma at the surgical site per ID recommendations.leukocytosis resolved.  Patient is afebrile.  Will stop Toradol 2/2 AKI-continue his oxy and tlenol for pain control.Normally on Oxy 5  every 6 hour at home- Defer to Surgery for PAIN RX at home since post op pain, he also follow up with pain maangement which can take over after discharge.   Group B Streptococcus sepsis POA also Prevotella bacteremia in anaerobic culture: Patient met sepsis criteria leukocytosis 11.8, fever 102 on admission.  Hopefully after ID eval tomorrow we will have plan for antibiotics for home  Acute blood loss anemia in the setting of anemia from chronic disease: Blood loss in the setting of amputation.Received 2 units total - on 12/29 and 12/31. H/H drifting, monitor Recent Labs  Lab 11/17/20 0400 11/18/20 0432 11/18/20 1420 11/19/20 0425 11/20/20 0427  HGB 7.5* 6.9* 7.8* 7.6* 7.3*  HCT 23.7* 22.6* 25.2* 23.7* 23.8*   GERD continue PPI  Hypertension: BP fairly stable likely multifactorial postop and pain issues. home lisinopril dose increased to 20 mg 1/1.   Diabetes mellitus on insulin pump at home, poorly controlled/nonketotic hyperosmolar hyperglycemia: Last hemoglobin A1c 12, off insulin drip.  Blood sugar remains on higher side we will continue on current  Lantus increeasd to 85 units 12/31,  And cont Premeal insulin to 10 units and sliding scale insulin.  He is on Reglan.  DM coordinator following. Recent Labs  Lab 11/19/20 0749 11/19/20 1206 11/19/20 1633 11/19/20 2232 11/20/20 0745  GLUCAP 237* 245* 197* 224* 287*   CKD IIIa with mild TJQ:[id number] slightly up at 1.2 discontinue Toradol. Monitor. Add ivf overnight. Patient refused iv fluids, he is encouraged TO DRINK well Recent Labs  Lab 11/16/20 0432 11/17/20 0400 11/18/20 0432 11/19/20 0425 11/20/20 0427  BUN _0 24*  CREATININE 1.09 1.03 0.96 1.13 1.25*  Hyperlipidemia continue statin  Low back pain history of right paracentral disc protrusion at L5-S1, CT L-spine no acute finding, continue pain control. B12 deficiency on B12 supplement Peripheral neuropathy/chronic pain, on Neurontin.  Takes Vicodin at home  as well. Headache, migraine type retro-orbital but no history of migraine, Imitrex 25 mg p.o. every 2 hours as needed consider subcommittees versus Fioricet if ineffective. Hyponatremia likely pseudohyponatremia from hyperglycemia.  Monitor Hypokalemia resolved Anxiety disorder: he is on BuSpar and Zoloft. Morbid Obesity weight body mass index is 32 kg/m.  Will benefit with weight loss.  Nutrition: Diet Order            Diet Carb Modified Fluid consistency: Thin; Room service appropriate? Yes  Diet effective now                 DVT prophylaxis: Lovenox Code Status:   Code Status: Full Code  Family Communication: plan of care discussed with patient and his wife at bedside.  Status is: Inpatient  Remains inpatient appropriate because:IV treatments appropriate due to intensity of illness or inability to take PO, Inpatient level of care appropriate due to severity of illness and ongoing surgical need   Dispo: The patient is from: Home              Anticipated d/c is to: HHPT-ORDER PLACED.              Anticipated d/c date is: Tomorrow with home and home health if okay with ID, Vascualr and if renal function stable              Patient currently is not medically stable to d/c.   Consultants: Vascular surgery, infectious disease. Procedures:see note  Culture/Microbiology    Component Value Date/Time   SDES BLOOD LEFT ANTECUBITAL 11/11/2020 1306   SPECREQUEST  11/11/2020 1306    BOTTLES DRAWN AEROBIC AND ANAEROBIC Blood Culture adequate volume   CULT  11/11/2020 1306    NO GROWTH 5 DAYS Performed at [facility], [address] [place], [place] [address]    REPTSTATUS 11/16/2020 FINAL 11/11/2020 1306    Other culture-see note  Medications: Scheduled Meds: . sodium chloride   Intravenous Once  . acetaminophen  650 mg Oral Q6H  . atorvastatin  40 mg Oral Daily  . busPIRone  15 mg Oral BID  . Chlorhexidine Gluconate Cloth  6 each Topical Daily  . dicyclomine   10 mg Oral TID AC & HS  . enoxaparin (LOVENOX) injection  50 mg Subcutaneous Daily  . famotidine  20 mg Oral BID  . ferrous sulfate  325 mg Oral BID WC  . gabapentin  600 mg Oral QHS  . gabapentin  300 mg Oral BID  . insulin aspart  0-20 Units Subcutaneous TID AC & HS  . insulin aspart  10 Units Subcutaneous TID WC  . insulin glargine  85 Units Subcutaneous Daily  . lidocaine  1 patch Transdermal Q24H  . lisinopril  20 mg Oral Daily  . methocarbamol  750 mg Oral QID  . metoCLOPramide  10 mg Oral TID AC  . multivitamin with minerals  1 tablet Oral Daily  . pantoprazole  40 mg Oral Daily  . sertraline  100 mg Oral Daily  . vitamin B-12  500 mcg Oral Daily   Continuous Infusions: . sodium chloride Stopped (11/13/20 0515)  . ampicillin-sulbactam (UNASYN) IV 3 g (11/20/20 0936)    Antimicrobials: Anti-infectives (From admission, onward)   Start     Dose/Rate  Route Frequency Ordered Stop   11/11/20 2222  vancomycin (VANCOCIN) powder  Status:  Discontinued          As needed 11/11/20 2222 11/11/20 2310   11/11/20 1600  Ampicillin-Sulbactam (UNASYN) 3 g in sodium chloride 0.9 % 100 mL IVPB        3 g 200 mL/hr over 30 Minutes Intravenous Every 6 hours 11/11/20 1508     11/11/20 0800  cefTRIAXone (ROCEPHIN) 2 g in sodium chloride 0.9 % 100 mL IVPB  Status:  Discontinued        2 g 200 mL/hr over 30 Minutes Intravenous Every 24 hours 11/11/20 0122 11/11/20 1506   11/09/20 0600  ceFEPIme (MAXIPIME) 2 g in sodium chloride 0.9 % 100 mL IVPB  Status:  Discontinued        2 g 200 mL/hr over 30 Minutes Intravenous Every 8 hours 11/09/20 0009 11/11/20 0122   11/09/20 0000  metroNIDAZOLE (FLAGYL) IVPB 500 mg  Status:  Discontinued        500 mg 100 mL/hr over 60 Minutes Intravenous Every 8 hours 11/08/20 1954 11/11/20 1507   11/08/20 2200  ceFEPIme (MAXIPIME) 2 g in sodium chloride 0.9 % 100 mL IVPB  Status:  Discontinued        2 g 200 mL/hr over 30 Minutes Intravenous Every 12 hours  11/08/20 1952 11/09/20 0009   11/08/20 1800  vancomycin (VANCOREADY) IVPB 1250 mg/250 mL  Status:  Discontinued        1,250 mg 166.7 mL/hr over 90 Minutes Intravenous Every 24 hours 11/07/20 1939 11/08/20 1258   11/08/20 1800  Ampicillin-Sulbactam (UNASYN) 3 g in sodium chloride 0.9 % 100 mL IVPB  Status:  Discontinued        3 g 200 mL/hr over 30 Minutes Intravenous Every 6 hours 11/08/20 1636 11/08/20 1951   11/08/20 1400  vancomycin (VANCOREADY) IVPB 750 mg/150 mL  Status:  Discontinued        750 mg 150 mL/hr over 60 Minutes Intravenous Every 12 hours 11/08/20 1258 11/10/20 1030   11/08/20 0800  piperacillin-tazobactam (ZOSYN) IVPB 3.375 g  Status:  Discontinued        3.375 g 12.5 mL/hr over 240 Minutes Intravenous Every 8 hours 11/07/20 1939 11/08/20 1636   11/07/20 2200  clindamycin (CLEOCIN) IVPB 600 mg  Status:  Discontinued        600 mg 100 mL/hr over 30 Minutes Intravenous Every 8 hours 11/07/20 1924 11/08/20 0834   11/07/20 1930  vancomycin (VANCOCIN) IVPB 1000 mg/200 mL premix  Status:  Discontinued        1,000 mg 200 mL/hr over 60 Minutes Intravenous  Once 11/07/20 1924 11/07/20 2244   11/07/20 1930  piperacillin-tazobactam (ZOSYN) IVPB 3.375 g  Status:  Discontinued        3.375 g 100 mL/hr over 30 Minutes Intravenous  Once 11/07/20 1924 11/08/20 1636   11/07/20 1815  vancomycin (VANCOREADY) IVPB 2000 mg/400 mL  Status:  Discontinued        2,000 mg 200 mL/hr over 120 Minutes Intravenous STAT 11/07/20 1801 11/07/20 1802   11/07/20 1815  vancomycin (VANCOCIN) IVPB 1000 mg/200 mL premix  Status:  Discontinued        1,000 mg 200 mL/hr over 60 Minutes Intravenous STAT 11/07/20 1802 11/08/20 1258   11/07/20 1800  ceFEPIme (MAXIPIME) 2 g in sodium chloride 0.9 % 100 mL IVPB        2 g 200 mL/hr over 30 Minutes Intravenous  STAT 11/07/20 1757 11/07/20 2303     Objective: Vitals: Today's Vitals   11/20/20 0525 11/20/20 0743 11/20/20 0744 11/20/20 0928  BP:   (!) 159/89    Pulse:   77   Resp:   18   Temp:   98 F (36.7 C)   TempSrc:   Oral   SpO2:   99%   Weight:      Height:      PainSc: 0-No pain 4   6     Intake/Output Summary (Last 24 hours) at 11/20/2020 1140 Last data filed at 11/19/2020 1511 Gross per 24 hour  Intake --  Output 600 ml  Net -600 ml   Filed Weights   11/14/20 0425 11/15/20 0344 11/17/20 0927  Weight: 106.7 kg 106.1 kg 104 kg   Weight change:   Intake/Output from previous day: 01/01 0701 - 01/02 0700 In: -  Out: 950 [Urine:950] Intake/Output this shift: No intake/output data recorded.  Examination: General exam: AAOx3 , NAD, weak appearing. HEENT:Oral mucosa moist, Ear/Nose WNL grossly, dentition normal. Respiratory system: bilaterally clear,no wheezing or crackles,no use of accessory muscle Cardiovascular system: S1 & S2 +, No JVD,. Gastrointestinal system: Abdomen soft, NT,ND, BS+ Nervous System:Alert, awake, moving extremities and grossly nonfocal Extremities: lt BKA stump in dressing, Skin: No rashes,no icterus. MSK: Normal muscle bulk,tone, power  Data Reviewed: I have personally reviewed following labs and imaging studies CBC: Recent Labs  Lab 11/15/20 0554 11/16/20 0432 11/17/20 0400 11/18/20 0432 11/18/20 1420 11/19/20 0425 11/20/20 0427  WBC 13.9* 11.6* 15.3* 10.3  --  10.2 9.1  NEUTROABS 10.4* 8.2*  --   --   --   --   --   HGB 7.0* 7.5* 7.5* 6.9* 7.8* 7.6* 7.3*  HCT 23.2* 24.5* 23.7* 22.6* 25.2* 23.7* 23.8*  MCV 87.5 88.1 86.2 88.6  --  85.9 87.5  PLT 382 408* 442* 452*  --  469* 349*   Basic Metabolic Panel: Recent Labs  Lab 11/16/20 0432 11/16/20 2200 11/17/20 0400 11/18/20 0432 11/19/20 0425 11/20/20 0427  NA 136  --  132* 135 136 138  K 3.9  --  4.1 3.9 3.9 4.0  CL 101  --  100 103 104 106  CO2 25  --  _0 GLUCOSE 226* 552* 316* 276* 267* 281*  BUN 13  --  _1 24*  CREATININE 1.09  --  1.03 0.96 1.13 1.25*  CALCIUM 8.4*  --  8.2* 8.0* 8.3* 8.3*   GFR: Estimated  Creatinine Clearance: 91.1 mL/min (A) (by C-G formula based on SCr of 1.25 mg/dL (H)). Liver Function Tests: No results for input(s): AST, ALT, ALKPHOS, BILITOT, PROT, ALBUMIN in the last 168 hours. No results for input(s): LIPASE, AMYLASE in the last 168 hours. No results for input(s): AMMONIA in the last 168 hours. Coagulation Profile: No results for input(s): INR, PROTIME in the last 168 hours. Cardiac Enzymes: No results for input(s): CKTOTAL, CKMB, CKMBINDEX, TROPONINI in the last 168 hours. BNP (last 3 results) No results for input(s): PROBNP in the last 8760 hours. HbA1C: No results for input(s): HGBA1C in the last 72 hours. CBG: Recent Labs  Lab 11/19/20 0749 11/19/20 1206 11/19/20 1633 11/19/20 2232 11/20/20 0745  GLUCAP 237* 245* 197* 224* 287*   Lipid Profile: No results for input(s): CHOL, HDL, LDLCALC, TRIG, CHOLHDL, LDLDIRECT in the last 72 hours. Thyroid Function Tests: No results for input(s): TSH, T4TOTAL, FREET4, T3FREE, THYROIDAB in the last 72 hours.  Anemia Panel: No results for input(s): VITAMINB12, FOLATE, FERRITIN, TIBC, IRON, RETICCTPCT in the last 72 hours. Sepsis Labs: No results for input(s): PROCALCITON, LATICACIDVEN in the last 168 hours.  Recent Results (from the past 240 hour(s))  CULTURE, BLOOD (ROUTINE X 2) w Reflex to ID Panel     Status: None   Collection Time: 11/11/20 12:48 PM   Specimen: BLOOD  Result Value Ref Range Status   Specimen Description BLOOD BLOOD RIGHT HAND  Final   Special Requests   Final    BOTTLES DRAWN AEROBIC AND ANAEROBIC Blood Culture adequate volume   Culture   Final    NO GROWTH 5 DAYS Performed at [facility], [address] [place], [place] [address]    Report Status 11/16/2020 FINAL  Final  CULTURE, BLOOD (ROUTINE X 2) w Reflex to ID Panel     Status: None   Collection Time: 11/11/20  1:06 PM   Specimen: BLOOD  Result Value Ref Range Status   Specimen Description BLOOD LEFT ANTECUBITAL  Final    Special Requests   Final    BOTTLES DRAWN AEROBIC AND ANAEROBIC Blood Culture adequate volume   Culture   Final    NO GROWTH 5 DAYS Performed at [facility], [address], [place], [place] [address]    Report Status 11/16/2020 FINAL  Final     Radiology Studies: No results found.   LOS: 13 days   Antonieta Pert, MD [facility]  11/20/2020, 11:40 AM

## 2020-11-20 NOTE — Progress Notes (Signed)
PT Cancellation Note  Patient Details Name: Richard Mendoza MRN: [id number] DOB: 1976-08-10   Cancelled Treatment:    Reason Eval/Treat Not Completed: Fatigue/lethargy limiting ability to participate   Pt sleeping most of AM.  Awake on this attempt. Stated he is fatigued and "not feeling it today."  Declined interventions.   Danielle Dess 11/20/2020, 11:40 AM

## 2020-11-21 DIAGNOSIS — L089 Local infection of the skin and subcutaneous tissue, unspecified: Secondary | ICD-10-CM | POA: Diagnosis not present

## 2020-11-21 DIAGNOSIS — B951 Streptococcus, group B, as the cause of diseases classified elsewhere: Secondary | ICD-10-CM | POA: Diagnosis not present

## 2020-11-21 DIAGNOSIS — I10 Essential (primary) hypertension: Secondary | ICD-10-CM

## 2020-11-21 DIAGNOSIS — R7881 Bacteremia: Secondary | ICD-10-CM | POA: Diagnosis not present

## 2020-11-21 DIAGNOSIS — E11628 Type 2 diabetes mellitus with other skin complications: Secondary | ICD-10-CM | POA: Diagnosis not present

## 2020-11-21 LAB — BASIC METABOLIC PANEL WITH GFR
Anion gap: 8 (ref 5–15)
BUN: 22 mg/dL — ABNORMAL HIGH (ref 6–20)
CO2: 24 mmol/L (ref 22–32)
Calcium: 8.1 mg/dL — ABNORMAL LOW (ref 8.9–10.3)
Chloride: 106 mmol/L (ref 98–111)
Creatinine, Ser: 1.09 mg/dL (ref 0.61–1.24)
GFR, Estimated: >60 mL/min
Glucose, Bld: 232 mg/dL — ABNORMAL HIGH (ref 70–99)
Potassium: 4.1 mmol/L (ref 3.5–5.1)
Sodium: 138 mmol/L (ref 135–145)

## 2020-11-21 LAB — GLUCOSE, CAPILLARY
Glucose-Capillary: 170 mg/dL — ABNORMAL HIGH (ref 70–99)
Glucose-Capillary: 329 mg/dL — ABNORMAL HIGH (ref 70–99)
Glucose-Capillary: 193 mg/dL — ABNORMAL HIGH (ref 70–99)

## 2020-11-21 LAB — CBC
HCT: 23.9 % — ABNORMAL LOW (ref 39.0–52.0)
Hemoglobin: 7.1 g/dL — ABNORMAL LOW (ref 13.0–17.0)
MCH: 26.1 pg (ref 26.0–34.0)
MCHC: 29.7 g/dL — ABNORMAL LOW (ref 30.0–36.0)
MCV: 87.9 fL (ref 80.0–100.0)
Platelets: 460 K/uL — ABNORMAL HIGH (ref 150–400)
RBC: 2.72 MIL/uL — ABNORMAL LOW (ref 4.22–5.81)
RDW: 16.1 % — ABNORMAL HIGH (ref 11.5–15.5)
WBC: 10.8 K/uL — ABNORMAL HIGH (ref 4.0–10.5)
nRBC: 0 % (ref 0.0–0.2)

## 2020-11-21 LAB — HEMOGLOBIN AND HEMATOCRIT, BLOOD
HCT: 26.1 % — ABNORMAL LOW (ref 39.0–52.0)
Hemoglobin: 8 g/dL — ABNORMAL LOW (ref 13.0–17.0)

## 2020-11-21 LAB — PREPARE RBC (CROSSMATCH)

## 2020-11-21 MED ORDER — OXYCODONE HCL 5 MG PO TABS: 5.0000 mg | ORAL_TABLET | ORAL | 0 refills | Status: DC | PRN

## 2020-11-21 MED ORDER — KETOROLAC TROMETHAMINE 15 MG/ML IJ SOLN
15.0000 mg | Freq: Once | INTRAMUSCULAR | Status: AC
  Administered 2020-11-21: 15 mg via INTRAVENOUS
  Filled 2020-11-21: qty 1

## 2020-11-21 MED ORDER — INSULIN GLARGINE 100 UNIT/ML ~~LOC~~ SOLN
90.0000 [IU] | Freq: Every day | SUBCUTANEOUS | Status: DC
  Administered 2020-11-21: 90 [IU] via SUBCUTANEOUS
  Filled 2020-11-21 (×2): qty 0.9

## 2020-11-21 MED ORDER — SODIUM CHLORIDE 0.9% IV SOLUTION: Freq: Once | INTRAVENOUS | Status: AC

## 2020-11-21 MED ORDER — LISINOPRIL 20 MG PO TABS: 20.0000 mg | ORAL_TABLET | Freq: Every day | ORAL | 0 refills | Status: DC

## 2020-11-21 MED ORDER — INSULIN ASPART 100 UNIT/ML ~~LOC~~ SOLN
12.0000 [IU] | Freq: Three times a day (TID) | SUBCUTANEOUS | Status: DC
  Administered 2020-11-21 (×2): 12 [IU] via SUBCUTANEOUS
  Filled 2020-11-21 (×2): qty 1

## 2020-11-21 NOTE — Progress Notes (Signed)
ID Pt wants to go home Says no more oozing from the stump   Patient Vitals for the past 24 hrs:  BP Temp Temp src Pulse Resp SpO2  11/21/20 1116 (!) 143/91 99 F (37.2 C) -- 79 18 100 %  11/21/20 0739 (!) 155/95 98.4 F (36.9 C) Oral 79 18 99 %  11/21/20 0609 (!) 160/90 98.2 F (36.8 C) -- 72 -- 97 %  11/20/20 2343 (!) 165/87 98.3 F (36.8 C) -- 75 -- 100 %  11/20/20 1954 (!) 160/82 98.2 F (36.8 C) -- 81 17 97 %  11/20/20 1610 (!) 159/91 97.8 F (36.6 C) -- 79 18 98 %  O/e awake and alert No distress Getting PRBC Chest b/l air entry [id number] Abd soft Left leg  BKA -stump with dressing   CBC Latest Ref Rng & Units 11/21/2020 11/20/2020 11/19/2020  WBC 4.0 - 10.5 K/uL 10.8(H) 9.1 10.2  Hemoglobin 13.0 - 17.0 g/dL 7.1(L) 7.3(L) 7.6(L)  Hematocrit 39.0 - 52.0 % 23.9(L) 23.8(L) 23.7(L)  Platelets 150 - 400 K/uL 460(H) 451(H) 469(H)    CMP Latest Ref Rng & Units 11/21/2020 11/20/2020 11/19/2020  Glucose 70 - 99 mg/dL 263(Z) 858(I) 502(D)  BUN 6 - 20 mg/dL 74(J) 28(N) 19  Creatinine 0.61 - 1.24 mg/dL 8.67 6.72(C) 9.47  Sodium 135 - 145 mmol/L 138 138 136  Potassium 3.5 - 5.1 mmol/L 4.1 4.0 3.9  Chloride 98 - 111 mmol/L 106 106 104  CO2 22 - 32 mmol/L 24 24 24   Calcium 8.9 - 10.3 mg/dL 8.1(L) 8.3(L) 8.3(L)  Total Protein 6.5 - 8.1 g/dL - - -  Total Bilirubin 0.3 - 1.2 mg/dL - - -  Alkaline Phos 38 - 126 U/L - - -  AST 15 - 41 U/L - - -  ALT 0 - 44 U/L - - -   Micro- Group B strep BC  Assessment/plan  Diabetic foot infection with necrotizing infection of left foot- initially underwent 3rd toe amputation and debridement of infected tissue, -Group B strep and anerobes in the wound- is on unasyn . but because of progression of necrosis and infection had to undergo BKA on 11/16/20. There wa ssome oozing from the edge of the stump for a few days. Now stopped As per vascular the stump looks good and there is no need for more antibiotics  Group B strep bacteremia Day 14 of antibiotic-  we can Dc it today   Severe anemia- combination of baseline anemia due to infection with post surgical loss Is getting PRBC  Diabetes mellitus- on insulin  CKD- stable and improved  Discussed the management with the patient and care team ID will sign off- call if needed

## 2020-11-21 NOTE — TOC Initial Note (Signed)
Transition of Care [facility]) - Initial/Assessment Note    Patient Details  Name: Richard Mendoza MRN: [id number] Date of Birth: 05-Jun-1976  [facility]) CM/SW Contact:    Magnus Ivan, LCSW Phone Number: 11/21/2020, 12:42 PM  Clinical Narrative:              CSW met with patient at bedside. Patient reported he lives with his wife who takes him to appointments. PCP is Dr. Ginette Pitman. [facility] [place]. Patient agreeable to HHPT/OT, RW, w/c, and 3 in 1. Patient denied agency preference. Referral to [facility] who accepted patient for PT and OT services. Referral to [facility] for 3 in 1, RW, and w/c. Asked MD for w/c order. DME to be delivered to room prior to DC today. Both agencies aware of plan to DC today.    Expected Discharge Plan: [place] Barriers to Discharge: Continued Medical Work up   Patient Goals and CMS Choice Patient states their goals for this hospitalization and ongoing recovery are:: home with home health services CMS Medicare.gov Compare Post Acute Care list provided to:: Patient Choice offered to / list presented to : Patient  Expected Discharge Plan and Services Expected Discharge Plan: [place]       Living arrangements for the past 2 months: Single Family Home                 DME Arranged: Wheelchair manual,Walker rolling,3-N-1 DME Agency: [employer] Date DME Agency Contacted: 11/21/20   Representative spoke with at DME Agency: Andree Coss and Nash Shearer [place] Arranged: PT,OT [place] Agency: [facility]) Date [place]: 11/21/20   Representative spoke with at [place]: Floydene Flock  Prior Living Arrangements/Services Living arrangements for the past 2 months: Single Family Home Lives with:: Self Patient language and need for interpreter reviewed:: Yes Do you feel safe going back to the place where you  live?: Yes      Need for Family Participation in Patient Care: Yes (Comment) Care giver support system in place?: Yes (comment)   Criminal Activity/Legal Involvement Pertinent to Current Situation/Hospitalization: No - Comment as needed  Activities of Daily Living Home Assistive Devices/Equipment: None ADL Screening (condition at time of admission) Patient's cognitive ability adequate to safely complete daily activities?: Yes Is the patient deaf or have difficulty hearing?: No Does the patient have difficulty seeing, even when wearing glasses/contacts?: No Does the patient have difficulty concentrating, remembering, or making decisions?: No Patient able to express need for assistance with ADLs?: No Does the patient have difficulty dressing or bathing?: No Independently performs ADLs?: Yes (appropriate for developmental age) Does the patient have difficulty walking or climbing stairs?: No Weakness of Legs: Left Weakness of Arms/Hands: None  Permission Sought/Granted Permission sought to share information with : Chartered certified accountant granted to share information with : Yes, Verbal Permission Granted     Permission granted to share info w AGENCY: [place], DME agencies        Emotional Assessment       Orientation: : Oriented to Self,Oriented to Place,Oriented to  Time,Oriented to Situation Alcohol / Substance Use: Not Applicable Psych Involvement: No (comment)  Admission diagnosis:  Necrotizing fasciitis ([place]) [M72.6] Hyperglycemia [R73.9] Necrotizing fasciitis of ankle and foot (H[facility]) [M72.6] Sepsis, due to unspecified organism, unspecified whether acute organ dysfunction present [facility]) [A41.9] Patient Active Problem List   Diagnosis Date Noted  .  S/P BKA (below knee amputation), left ([place]) 11/20/2020  . Necrotizing fasciitis of ankle and foot ([place]) 11/07/2020  . Pain management contract signed 09/07/2019  . Evaluation by psychiatric service required  08/18/2019  . History of depression 08/18/2019  . Primary osteoarthritis of both shoulders 07/16/2019  . 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 Chronic pain of both shoulders 07/16/2019  . Cervical radicular pain 07/16/2019  . S/P cervical spinal fusion 07/16/2019  . Cervical spondylosis 07/16/2019  . Cervical facet joint syndrome 07/16/2019  . Chronic pain syndrome 07/16/2019  . Anemia 05/25/2019  . Osteomyelitis of right foot ([place]) 05/25/2019  . Diabetic foot infection ([place]) 03/05/2019  . Abscess 11/25/2018  . Diabetic peripheral neuropathy associated with type 2 diabetes mellitus ([place]) 06/16/2018  . Hyperlipidemia associated with type 2 diabetes mellitus ([place]) 06/16/2018  . Intractable nausea and vomiting 10/21/2017  . HNP (herniated nucleus pulposus), cervical 09/09/2017  . Erectile dysfunction 07/31/2017  . Gastroesophageal reflux disease 07/31/2017  . Rotator cuff syndrome of left shoulder 07/31/2017  . Cellulitis of right leg 07/30/2016  . Tobacco abuse disorder 07/30/2016  . Routine general medical examination at a health care facility 01/20/2014  . Diabetic neuropathy, type I diabetes mellitus ([place]) 01/20/2014  . 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 HYPERLIPIDEMIA 03/17/2009  . DM type 2 with diabetic peripheral neuropathy ([place]) 03/02/2009  . DM 12/08/2008  . OBSTRUCTIVE SLEEP APNEA 12/08/2008  . Essential hypertension 12/08/2008  . Allergic rhinitis 12/15/2007   PCP:  Tracie Harrier, MD Pharmacy[facility]   [id number] [place], [place] [address]Phone: [phone]: [phone]    Social Determinants of Health (SDOH) Interventions    Readmission Risk Interventions Readmission Risk Prevention Plan 03/10/2019  Medication Screening Complete  Transportation Screening Complete  Some recent data might be hidden

## 2020-11-21 NOTE — Progress Notes (Signed)
[facility] Daily Progress Note  Subjective: 11/16/20: Left below-the-knee amputation  Patient without complaint this AM.  Working with PT.  No issues overnight.  Objective: Vitals:   11/20/20 2343 11/21/20 0609 11/21/20 0739 11/21/20 1116  BP: (!) 165/87 (!) 160/90 (!) 155/95 (!) 143/91  Pulse: 75 72 79 79  Resp:   18 18  Temp: 98.3 F (36.8 C) 98.2 F (36.8 C) 98.4 F (36.9 C) 99 F (37.2 C)  TempSrc:   Oral   SpO2: 100% 97% 99% 100%  Weight:      Height:        Intake/Output Summary (Last 24 hours) at 11/21/2020 1139 Last data filed at 11/21/2020 4098 Gross per 24 hour  Intake --  Output 600 ml  Net -600 ml   Physical Exam: A&Ox3, NAD CV: RRR Pulmonary: CTA Bilaterally Abdomen: Soft, Nontender, Nondistended Vascular:  Left lower extremity: Thigh soft.  Flexible at the knee.  Stump is healing well.   Laboratory: CBC    Component Value Date/Time   WBC 10.8 (H) 11/21/2020 0408   HGB 7.1 (L) 11/21/2020 0408   HCT 23.9 (L) 11/21/2020 0408   PLT 460 (H) 11/21/2020 0408   BMET    Component Value Date/Time   NA 138 11/21/2020 0408   K 4.1 11/21/2020 0408   CL 106 11/21/2020 0408   CO2 24 11/21/2020 0408   GLUCOSE 232 (H) 11/21/2020 0408   BUN 22 (H) 11/21/2020 0408   CREATININE 1.09 11/21/2020 0408   CALCIUM 8.1 (L) 11/21/2020 0408   GFRNONAA >60 11/21/2020 0408   GFRAA >60 05/05/2020 1930   Assessment/Planning: The patient is a 45 year old male status post below the knee amputation  1) patient continues to insist on discharge home.  I had a long conversation with the patient in the presence of the physical therapist in regard to the risks of being discharged home from the hospital.  He understands that he is at a higher risk for fall and stump failure.  Patient expresses understanding and would like to take these risks.  Patient understands that he must remain flexible at the knee joint in order to wear prosthetic.  2) patient is  currently under contract with a pain clinic.  He understands that he has to call the pain clinic and update them to situation.  He will be discharged with 1 week of pain medication.  We will plan to see him the following week in our clinic for his first postoperative visit.  If he needs, another prescription for pain medication can be written at that time before his pain clinic takes back over.  3) wound care: ABD to incision line, covered by [employer], covered by [employer]  4) okay to stop ABX as per vascular  Discussed with Dr. Wallis Mart [facility] PA-C 11/21/2020 11:39 AM

## 2020-11-21 NOTE — Progress Notes (Signed)
    Durable Medical Equipment  (From admission, onward)         Start     Ordered   11/21/20 1403  For home use only DME lightweight manual wheelchair with seat cushion  Once       Comments: Patient suffers from weakness which impairs their ability to perform daily activities like walking in the home.  A walker will not resolve  issue with performing activities of daily living. A wheelchair will allow patient to safely perform daily activities. Patient is not able to propel themselves in the home using a standard weight wheelchair due to weakness. Patient can self propel in the lightweight wheelchair. Length of need 6 months Accessories: elevating leg rests (ELRs), wheel locks, extensions and anti-tippers.   11/21/20 1404   11/20/20 0835  For home use only DME 3 n 1  Once        11/20/20 0835   11/20/20 0835  For home use only DME Walker rolling  Once       Question Answer Comment  Walker: With 5 Inch Wheels   Patient needs a walker to treat with the following condition Amputation of leg (HCC)      11/20/20 5102

## 2020-11-21 NOTE — Progress Notes (Incomplete)
PROGRESS NOTE    Richard Mendoza  YKD:[id number] DOB: 1975/12/22 DOA: 11/07/2020 PCP: Tracie Harrier, MD   Brief Narrative: 45 year old male with type II uncontrolled diabetes on insulin pump, hypertension, dyslipidemia presents to the ED with left foot infection acute, worsening, was referred by podiatrist.  Patient endorsed uncontrolled blood sugar level, also nausea, fever and chills at home.  He was on doxycycline by Dr. Vickki Muff but infection was not improving. He in the ED he was febrile and lab with a leukocytosis, COVID-19 negative, left foot x-ray diffuse subcutaneous gas along the plantar foot extending to the third toe concerning for necrotizing fasciitis, patient was seen by Dr. 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  Patient had a lot of pain postop, now pain controlled with Toradol and oxy. His hemoglobin had dropped and needing 1 unit PRBC transfusion on 12/29 and 12/31.  Subjective: Seen this morning is alert awake, complains of pain at the stump site.  Hemoglobin drifted down to 7.1 g.  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  Appreciate ID input monitor how the wound is healing, patient having bleeding from the areas dressing being changed by vascular, PT OT.  Today with IV Unasyn and discontinue 11/21/2020 as per ID and vascular.  Pain management postop as per vascular and has been sent to the pharmacy and he will also contact us pain clinic.  Patient was advised to go to skilled nursing facility but at this time he  wants to go to home and home health is being arranged.  Vascular surgery aware and okay to discharge to home with home health. Wound care with  'ABD to incision line, covered by [employer], covered by [employer]'  Group B Streptococcus sepsis POA also Prevotella bacteremia in anaerobic culture: Patient met sepsis criteria leukocytosis 11.8, fever 102 on admission.   Antibiotic discontinued by ID 11/3.  Acute blood loss anemia in the setting of acute blood anemia from oozing of blood from stump/surgeru in the setting of anemia of chronic disease: Blood loss in the setting of amputation.Received 2 units total - on 12/29 and 12/31 and 1 unit ordered for today.  Advised to take CBC in 4 to 5 days from primary care doctor.  Check H&H 12 posttransfusion.  Denies any acute blood loss. reports he had recent colonoscopy in "couple of years" and was normal.  GERD:Continue PPI  Hypertension: BP fairly stable likely multifactorial postop and pain issues. home lisinopril dose increased to 20 mg 1/1.  Continue same.  Diabetes mellitus on insulin pump at home, poorly controlled/nonketotic hyperosmolar hyperglycemia: Last hemoglobin A1c 12, off insulin drip.    Insulin regimen adjusted while here with Lantus Premeal insulin sliding scale.  Resume home regimen upon discharge.  Marland Kitchen Here  On 90 units lantus , Premeal insulin 12 units and sliding scale insulin.  He is on Reglan.  DM coordinator following.  CKD IIIa with mild LZJ:[id number] slightly up at 1.2 discontinued Toradol. Creat at 1.0 Recent Labs  Lab 11/17/20 0400 11/18/20 0432 11/19/20 0425 11/20/20 0427 11/21/20 0408  BUN 18 14 19  24* 22*  CREATININE 1.03 0.96 1.13 1.25* 1.09   Hyperlipidemia continue statin  Low back pain history of right paracentral disc protrusion at L5-S1, CT L-spine no acute finding, continue pain control. B12 deficiency on B12 supplement Peripheral neuropathy/chronic pain, on Neurontin.  Takes Vicodin at home as well. Headache,  migraine type retro-orbital but no history of migraine, Imitrex 25 mg p.o. every 2 hours as needed consider subcommittees versus Fioricet if ineffective. Hyponatremia likely pseudohyponatremia from hyperglycemia.  Monitor Hypokalemia resolved Anxiety disorder: he is on BuSpar and Zoloft. Morbid Obesity weight body mass index is 32 kg/m.  Will benefit with weight loss  Patient adamant in going home today after blood transfusion.    Nutrition: Diet Order            Diet Carb Modified Fluid consistency: Thin; Room service appropriate? Yes  Diet effective now                 DVT prophylaxis: Lovenox Code Status:   Code Status: Full Code  Family Communication: plan of care discussed with patient and his wife at bedside.  Status is: Inpatient  Remains inpatient appropriate because:IV treatments appropriate due to intensity of illness or inability to take PO, Inpatient level of care appropriate due to severity of illness and ongoing surgical need   Dispo: The patient is from: Home              Anticipated d/c is to: [facility]PT-ORDER PLACED.              Anticipated d/c date is: Once H&H is stable after transfusion today or tomorrow               Patient currently is not medically stable to d/c.   Consultants: Vascular surgery, infectious disease. Procedures:see note  Culture/Microbiology    Component Value Date/Time   SDES BLOOD LEFT ANTECUBITAL 11/11/2020 1306   SPECREQUEST  11/11/2020 1306    BOTTLES DRAWN AEROBIC AND ANAEROBIC Blood Culture adequate volume   CULT  11/11/2020 1306    NO GROWTH 5 DAYS Performed at [facility], [address] [place], [place] [address]    REPTSTATUS 11/16/2020 FINAL 11/11/2020 1306    Other culture-see note  Medications: Scheduled Meds: . sodium chloride   Intravenous Once  . acetaminophen  650 mg Oral Q6H  . atorvastatin  40 mg Oral Daily  . busPIRone  15 mg Oral BID  . Chlorhexidine Gluconate Cloth  6 each Topical Daily  .  dicyclomine  10 mg Oral TID AC & HS  . enoxaparin (LOVENOX) injection  50 mg Subcutaneous Daily  . famotidine  20 mg Oral BID  . ferrous sulfate  325 mg Oral BID WC  . gabapentin  600 mg Oral QHS  . gabapentin  300 mg Oral BID  . insulin aspart  0-20 Units Subcutaneous TID AC & HS  . insulin aspart  12 Units Subcutaneous TID WC  . insulin glargine  90 Units Subcutaneous Daily  . lidocaine  1 patch Transdermal Q24H  . lisinopril  20 mg Oral Daily  . methocarbamol  750 mg Oral QID  . metoCLOPramide  10 mg Oral TID AC  . multivitamin with minerals  1 tablet Oral Daily  . pantoprazole  40 mg Oral Daily  . sertraline  100 mg Oral Daily  . vitamin B-12  500 mcg Oral Daily   Continuous Infusions: . sodium chloride Stopped (11/13/20 0515)    Antimicrobials: Anti-infectives (From admission, onward)   Start  Dose/Rate Route Frequency Ordered Stop   11/11/20 2222  vancomycin (VANCOCIN) powder  Status:  Discontinued          As needed 11/11/20 2222 11/11/20 2310   11/11/20 1600  Ampicillin-Sulbactam (UNASYN) 3 g in sodium chloride 0.9 % 100 mL IVPB  Status:  Discontinued        3 g 200 mL/hr over 30 Minutes Intravenous Every 6 hours 11/11/20 1508 11/21/20 1247   11/11/20 0800  cefTRIAXone (ROCEPHIN) 2 g in sodium chloride 0.9 % 100 mL IVPB  Status:  Discontinued        2 g 200 mL/hr over 30 Minutes Intravenous Every 24 hours 11/11/20 0122 11/11/20 1506   11/09/20 0600  ceFEPIme (MAXIPIME) 2 g in sodium chloride 0.9 % 100 mL IVPB  Status:  Discontinued        2 g 200 mL/hr over 30 Minutes Intravenous Every 8 hours 11/09/20 0009 11/11/20 0122   11/09/20 0000  metroNIDAZOLE (FLAGYL) IVPB 500 mg  Status:  Discontinued        500 mg 100 mL/hr over 60 Minutes Intravenous Every 8 hours 11/08/20 1954 11/11/20 1507   11/08/20 2200  ceFEPIme (MAXIPIME) 2 g in sodium chloride 0.9 % 100 mL IVPB  Status:  Discontinued        2 g 200 mL/hr over 30 Minutes Intravenous Every 12 hours 11/08/20 1952  11/09/20 0009   11/08/20 1800  vancomycin (VANCOREADY) IVPB 1250 mg/250 mL  Status:  Discontinued        1,250 mg 166.7 mL/hr over 90 Minutes Intravenous Every 24 hours 11/07/20 1939 11/08/20 1258   11/08/20 1800  Ampicillin-Sulbactam (UNASYN) 3 g in sodium chloride 0.9 % 100 mL IVPB  Status:  Discontinued        3 g 200 mL/hr over 30 Minutes Intravenous Every 6 hours 11/08/20 1636 11/08/20 1951   11/08/20 1400  vancomycin (VANCOREADY) IVPB 750 mg/150 mL  Status:  Discontinued        750 mg 150 mL/hr over 60 Minutes Intravenous Every 12 hours 11/08/20 1258 11/10/20 1030   11/08/20 0800  piperacillin-tazobactam (ZOSYN) IVPB 3.375 g  Status:  Discontinued        3.375 g 12.5 mL/hr over 240 Minutes Intravenous Every 8 hours 11/07/20 1939 11/08/20 1636   11/07/20 2200  clindamycin (CLEOCIN) IVPB 600 mg  Status:  Discontinued        600 mg 100 mL/hr over 30 Minutes Intravenous Every 8 hours 11/07/20 1924 11/08/20 0834   11/07/20 1930  vancomycin (VANCOCIN) IVPB 1000 mg/200 mL premix  Status:  Discontinued        1,000 mg 200 mL/hr over 60 Minutes Intravenous  Once 11/07/20 1924 11/07/20 2244   11/07/20 1930  piperacillin-tazobactam (ZOSYN) IVPB 3.375 g  Status:  Discontinued        3.375 g 100 mL/hr over 30 Minutes Intravenous  Once 11/07/20 1924 11/08/20 1636   11/07/20 1815  vancomycin (VANCOREADY) IVPB 2000 mg/400 mL  Status:  Discontinued        2,000 mg 200 mL/hr over 120 Minutes Intravenous STAT 11/07/20 1801 11/07/20 1802   11/07/20 1815  vancomycin (VANCOCIN) IVPB 1000 mg/200 mL premix  Status:  Discontinued        1,000 mg 200 mL/hr over 60 Minutes Intravenous STAT 11/07/20 1802 11/08/20 1258   11/07/20 1800  ceFEPIme (MAXIPIME) 2 g in sodium chloride 0.9 % 100 mL IVPB        2 g 200  mL/hr over 30 Minutes Intravenous STAT 11/07/20 1757 11/07/20 2303     Objective: Vitals: Today's Vitals   11/21/20 1330 11/21/20 1340 11/21/20 1341 11/21/20 1419  BP: (!) 143/90 (!) 143/83  (!)  154/91  Pulse: 76 79  75  Resp: 18 18  18   Temp: 98.5 F (36.9 C) 98.3 F (36.8 C)  98.2 F (36.8 C)  TempSrc: Oral   Oral  SpO2:  97%  97%  Weight:      Height:      PainSc:   5      Intake/Output Summary (Last 24 hours) at 11/21/2020 1421 Last data filed at 11/21/2020 1413 Gross per 24 hour  Intake 480 ml  Output 600 ml  Net -120 ml   Filed Weights   11/14/20 0425 11/15/20 0344 11/17/20 0927  Weight: 106.7 kg 106.1 kg 104 kg   Weight change:   Intake/Output from previous day: 01/02 0701 - 01/03 0700 In: -  Out: 600 [Urine:600] Intake/Output this shift: Total I/O In: 480 [P.O.:480] Out: -   Examination: General exam: AAOx3 , NAD, weak appearing. HEENT:Oral mucosa moist, Ear/Nose WNL grossly, dentition normal. Respiratory system: bilaterally clear,no wheezing or crackles,no use of accessory muscle Cardiovascular system: S1 & S2 +, No JVD,. Gastrointestinal system: Abdomen soft, NT,ND, BS+ Nervous System:Alert, awake, moving extremities and grossly nonfocal Extremities: Left foot stump with dressing intact Skin: No rashes,no icterus. MSK: Normal muscle bulk,tone, power  Data Reviewed: I have personally reviewed following labs and imaging studies CBC: Recent Labs  Lab 11/15/20 0554 11/16/20 0432 11/17/20 0400 11/18/20 0432 11/18/20 1420 11/19/20 0425 11/20/20 0427 11/21/20 0408  WBC 13.9* 11.6* 15.3* 10.3  --  10.2 9.1 10.8*  NEUTROABS 10.4* 8.2*  --   --   --   --   --   --   HGB 7.0* 7.5* 7.5* 6.9* 7.8* 7.6* 7.3* 7.1*  HCT 23.2* 24.5* 23.7* 22.6* 25.2* 23.7* 23.8* 23.9*  MCV 87.5 88.1 86.2 88.6  --  85.9 87.5 87.9  PLT 382 408* 442* 452*  --  469* 451* 263*   Basic Metabolic Panel: Recent Labs  Lab 11/17/20 0400 11/18/20 0432 11/19/20 0425 11/20/20 0427 11/21/20 0408  NA 132* 135 136 138 138  K 4.1 3.9 3.9 4.0 4.1  CL 100 103 104 106 106  CO2 25 25 24 24 24   GLUCOSE 316* 276* 267* 281* 232*  BUN 18 14 19  24* 22*  CREATININE 1.03 0.96 1.13  1.25* 1.09  CALCIUM 8.2* 8.0* 8.3* 8.3* 8.1*   GFR: Estimated Creatinine Clearance: 104.5 mL/min (by C-G formula based on SCr of 1.09 mg/dL). Liver Function Tests: No results for input(s): AST, ALT, ALKPHOS, BILITOT, PROT, ALBUMIN in the last 168 hours. No results for input(s): LIPASE, AMYLASE in the last 168 hours. No results for input(s): AMMONIA in the last 168 hours. Coagulation Profile: No results for input(s): INR, PROTIME in the last 168 hours. Cardiac Enzymes: No results for input(s): CKTOTAL, CKMB, CKMBINDEX, TROPONINI in the last 168 hours. BNP (last 3 results) No results for input(s): PROBNP in the last 8760 hours. HbA1C: No results for input(s): HGBA1C in the last 72 hours. CBG: Recent Labs  Lab 11/20/20 1153 11/20/20 1646 11/20/20 2145 11/21/20 0741 11/21/20 1117  GLUCAP 270* 132* 215* 170* 329*   Lipid Profile: No results for input(s): CHOL, HDL, LDLCALC, TRIG, CHOLHDL, LDLDIRECT in the last 72 hours. Thyroid Function Tests: No results for input(s): TSH, T4TOTAL, FREET4, T3FREE, THYROIDAB in the last 72  hours. Anemia Panel: No results for input(s): VITAMINB12, FOLATE, FERRITIN, TIBC, IRON, RETICCTPCT in the last 72 hours. Sepsis Labs: No results for input(s): PROCALCITON, LATICACIDVEN in the last 168 hours.  No results found for this or any previous visit (from the past 240 hour(s)).   Radiology Studies: No results found.   LOS: 14 days   Antonieta Pert, MD [facility]  11/21/2020, 2:21 PM

## 2020-11-21 NOTE — Progress Notes (Signed)
Inpatient Diabetes Program Recommendations  AACE/ADA: New Consensus Statement on Inpatient Glycemic Control (2015)  Target Ranges:  Prepandial:   less than 140 mg/dL      Peak postprandial:   less than 180 mg/dL (1-2 hours)      Critically ill patients:  140 - 180 mg/dL   Lab Results  Component Value Date   GLUCAP 170 (H) 11/21/2020   HGBA1C 12.0 (H) 11/07/2020    Review of Glycemic Control Results for Richard Mendoza, Richard Mendoza (MRN [id number]) as of 11/21/2020 08:18  Ref. Range 11/19/2020 07:49 11/19/2020 12:06 11/19/2020 16:33 11/19/2020 22:32 11/20/2020 07:45 11/20/2020 11:53 11/20/2020 16:46 11/20/2020 21:45 11/21/2020 07:41  Glucose-Capillary Latest Ref Range: 70 - 99 mg/dL 157 (H) 262 (H) 035 (H) 224 (H) 287 (H) 270 (H) 132 (H) 215 (H) 170 (H)  Home DM Meds:Insulin Pump Actos 30 mg Daily  Current Orders:Lantus 85 units Daily Novolog Resistant Correction Scale/ SSI (0-20 units) TID AC + HS Novolog 10 units TID with meals  Inpatient Diabetes Program Recommendations:    Please increase Lantus to 90 units daily.  Also consider increasing Novolog meal coverage to 12 units tid with meals.   Thanks,  Beryl Meager, RN, BC-ADM Inpatient Diabetes Coordinator Pager [phone] (8a-5p)

## 2020-11-21 NOTE — Discharge Instructions (Addendum)
Vascular Surgery Discharge Instructions: ABD to incision line, covered by Kerl[employer], covered by [employer]   Hyperglycemia Hyperglycemia is when the sugar (glucose) level in your blood is too high. It may not cause symptoms. If you do have symptoms, they may include warning signs, such as:  Feeling more thirsty than normal.  Hunger.  Feeling tired.  Needing to pee (urinate) more than normal.  Blurry eyesight (vision). You may get other symptoms as it gets worse, such as:  Dry mouth.  Not being hungry (loss of appetite).  Fruity-smelling breath.  Weakness.  Weight gain or loss that is not planned. Weight loss may be fast.  A tingling or numb feeling in your hands or feet.  Headache.  Skin that does not bounce back quickly when it is lightly pinched and released (poor skin turgor).  Pain in your belly (abdomen).  Cuts or bruises that heal slowly. High blood sugar can happen to people who do or do not have diabetes. High blood sugar can happen slowly or quickly, and it can be an emergency. Follow these instructions at home: General instructions  Take over-the-counter and prescription medicines only as told by your doctor.  Do not use products that contain nicotine or tobacco, such as cigarettes and e-cigarettes. If you need help quitting, ask your doctor.  Limit alcohol intake to no more than 1 drink per day for nonpregnant women and 2 drinks per day for men. One drink equals 12 oz of beer, 5 oz of wine, or 1 oz of hard liquor.  Manage stress. If you need help with this, ask your doctor.  Keep all follow-up visits as told by your doctor. This is important. Eating and drinking   Stay at a healthy weight.  Exercise regularly, as told by your doctor.  Drink enough fluid, especially when you: ? Exercise. ? Get sick. ? Are in hot temperatures.  Eat healthy foods, such as: ? Low-fat (lean) proteins. ? Complex carbs (complex carbohydrates), such as whole wheat bread or  brown rice. ? Fresh fruits and vegetables. ? Low-fat dairy products. ? Healthy fats.  Drink enough fluid to keep your pee (urine) clear or pale yellow. If you have diabetes:   Make sure you know the symptoms of hyperglycemia.  Follow your diabetes management plan, as told by your doctor. Make sure you: ? Take insulin and medicines as told. ? Follow your exercise plan. ? Follow your meal plan. Eat on time. Do not skip meals. ? Check your blood sugar as often as told. Make sure to check before and after exercise. If you exercise longer or in a different way than you normally do, check your blood sugar more often. ? Follow your sick day plan whenever you cannot eat or drink normally. Make this plan ahead of time with your doctor.  Share your diabetes management plan with people in your workplace, school, and household.  Check your urine for ketones when you are ill and as told by your doctor.  Carry a card or wear jewelry that says that you have diabetes. Contact a doctor if:  Your blood sugar level is higher than 240 mg/dL (76.7 mmol/L) for 2 days in a row.  You have problems keeping your blood sugar in your target range.  High blood sugar happens often for you. Get help right away if:  You have trouble breathing.  You have a change in how you think, feel, or act (mental status).  You feel sick to your stomach (nauseous), and  that feeling does not go away.  You cannot stop throwing up (vomiting). These symptoms may be an emergency. Do not wait to see if the symptoms will go away. Get medical help right away. Call your local emergency services (911 in the [place] Do not drive yourself to the hospital. Summary  Hyperglycemia is when the sugar (glucose) level in your blood is too high.  High blood sugar can happen to people who do or do not have diabetes.  Make sure you drink enough fluids, eat healthy foods, and exercise regularly.  Contact your doctor if you have problems  keeping your blood sugar in your target range. This information is not intended to replace advice given to you by your health care provider. Make sure you discuss any questions you have with your health care provider. Document Revised: 07/23/2016 Document Reviewed: 07/23/2016 Elsevier Patient Education  [place].   Diabetic Neuropathy Diabetic neuropathy refers to nerve damage that is caused by diabetes (diabetes mellitus). Over time, people with diabetes can develop nerve damage throughout the body. There are several types of diabetic neuropathy:  Peripheral neuropathy. This is the most common type of diabetic neuropathy. It causes damage to nerves that carry signals between the spinal cord and other parts of the body (peripheral nerves). This usually affects nerves in the feet and legs first, and may eventually affect the hands and arms. The damage affects the ability to sense touch or temperature.  Autonomic neuropathy. This type causes damage to nerves that control involuntary functions (autonomic nerves). These nerves carry signals that control: ? Heartbeat. ? Body temperature. ? Blood pressure. ? Urination. ? Digestion. ? Sweating. ? Sexual function. ? Response to changing blood sugar (glucose) levels.  Focal neuropathy. This type of nerve damage affects one area of the body, such as an arm, a leg, or the face. The injury may involve one nerve or a small group of nerves. Focal neuropathy can be painful and unpredictable, and occurs most often in older adults with diabetes. This often develops suddenly, but usually improves over time and does not cause long-term problems.  Proximal neuropathy. This type of nerve damage affects the nerves of the thighs, hips, buttocks, or legs. It causes severe pain, weakness, and muscle death (atrophy), usually in the thigh muscles. It is more common among older men and people who have type 2 diabetes. The length of recovery time may vary. What  are the causes? Peripheral, autonomic, and focal neuropathies are caused by diabetes that is not well controlled with treatment. The cause of proximal neuropathy is not known, but it may be caused by inflammation related to uncontrolled blood glucose levels. What are the signs or symptoms? Peripheral neuropathy Peripheral neuropathy develops slowly over time. When the nerves of the feet and legs no longer work, you may experience:  Burning, stabbing, or aching pain in the legs or feet.  Pain or cramping in the legs or feet.  Loss of feeling (numbness) and inability to feel pressure or pain in the feet. This can lead to: ? Thick calluses or sores on areas of constant pressure. ? Ulcers. ? Reduced ability to feel temperature changes.  Foot deformities.  Muscle weakness.  Loss of balance or coordination. Autonomic neuropathy The symptoms of autonomic neuropathy vary depending on which nerves are affected. Symptoms may include:  Problems with digestion, such as: ? Nausea or vomiting. ? Poor appetite. ? Bloating. ? Diarrhea or constipation. ? Trouble swallowing. ? Losing weight without trying to.  Problems with the heart, blood and lungs, such as: ? Dizziness, especially when standing up. ? Fainting. ? Shortness of breath. ? Irregular heartbeat.  Bladder problems, such as: ? Trouble starting or stopping urination. ? Leaking urine. ? Trouble emptying the bladder. ? Urinary tract infections (UTIs).  Problems with other body functions, such as: ? Sweat. You may sweat too much or too little. ? Temperature. You might get hot easily. Or, you might feel cold more than usual. ? Sexual function. Men may not be able to get or maintain an erection. Women may have vaginal dryness and difficulty with arousal. Focal neuropathy Symptoms affect only one area of the body. Common symptoms include:  Numbness.  Tingling.  Burning pain.  Prickling feeling.  Very sensitive  skin.  Weakness.  Inability to move (paralysis).  Muscle twitching.  Muscles getting smaller (wasting).  Poor coordination.  Double or blurred vision. Proximal neuropathy  Sudden, severe pain in the hip, thigh, or buttocks. Pain may spread from the back into the legs (sciatica).  Pain and numbness in the arms and legs.  Tingling.  Loss of bladder control or bowel control.  Weakness and wasting of thigh muscles.  Difficulty getting up from a seated position.  Abdominal swelling.  Unexplained weight loss. How is this diagnosed? Diagnosis usually involves reviewing your medical history and any symptoms you have. Diagnosis varies depending on the type of neuropathy your health care provider suspects. Peripheral neuropathy Your health care provider will check areas that are affected by your nervous system (neurologic exam), such as your reflexes, how you move, and what you can feel. You may have other tests, such as:  Blood tests.  Removal and examination of fluid that surrounds the spinal cord (lumbar puncture).  CT scan.  MRI.  A test to check the nerves that control muscles (electromyogram, EMG).  Tests of how quickly messages pass through your nerves (nerve conduction velocity tests).  Removal of a small piece of nerve to be examined under a microscope (biopsy). Autonomic neuropathy You may have tests, such as:  Tests to measure your blood pressure and heart rate. This may include monitoring you while you are safely secured to an exam table that moves you from a lying position to an upright position (table tilt test).  Breathing tests to check your lungs.  Tests to check how food moves through the digestive system (gastric emptying tests).  Blood, sweat, or urine tests.  Ultrasound of your bladder.  Spinal fluid tests. Focal neuropathy This condition may be diagnosed with:  A neurologic exam.  CT scan.  MRI.  EMG.  Nerve conduction velocity  tests. Proximal neuropathy There is no test to diagnose this type of neuropathy. You may have tests to rule out other possible causes of this type of neuropathy. Tests may include:  X-rays of your spine and lumbar region.  Lumbar puncture.  MRI. How is this treated? The goal of treatment is to keep nerve damage from getting worse. The most important part of treatment is keeping your blood glucose level and your A1C level within your target range by following your diabetes management plan. Over time, maintaining lower blood glucose levels helps lessen symptoms. In some cases, you may need prescription pain medicine. Follow these instructions at home:  Lifestyle   Do not use any products that contain nicotine or tobacco, such as cigarettes and e-cigarettes. If you need help quitting, ask your health care provider.  Be physically active every day. Include strength training  and balance exercises.  Follow a healthy meal plan.  Work with your health care provider to manage your blood pressure. General instructions  Follow your diabetes management plan as directed. ? Check your blood glucose levels as directed by your health care provider. ? Keep your blood glucose in your target range as directed by your health care provider. ? Have your A1C level checked at least two times a year, or as often as told by your health care provider.  Take over the counter and prescription medicines only as told by your health care provider. This includes insulin and diabetes medicine.  Do not drive or use heavy machinery while taking prescription pain medicines.  Check your skin and feet every day for cuts, bruises, redness, blisters, or sores.  Keep all follow up visits as told by your health care provider. This is important. Contact a health care provider if:  You have burning, stabbing, or aching pain in your legs or feet.  You are unable to feel pressure or pain in your feet.  You develop  problems with digestion, such as: ? Nausea. ? Vomiting. ? Bloating. ? Constipation. ? Diarrhea. ? Abdominal pain.  You have difficulty with urination, such as inability: ? To control when you urinate (incontinence). ? To completely empty the bladder (retention).  You have palpitations.  You feel dizzy, weak, or faint when you stand up. Get help right away if:  You cannot urinate.  You have sudden weakness or loss of coordination.  You have trouble speaking.  You have pain or pressure in your chest.  You have an irregular heart beat.  You have sudden inability to move a part of your body. Summary  Diabetic neuropathy refers to nerve damage that is caused by diabetes. It can affect nerves throughout the entire body, causing numbness and pain in the arms, legs, digestive tract, heart, and other body systems.  Keep your blood glucose level and your blood pressure in your target range, as directed by your health care provider. This can help prevent neuropathy from getting worse.  Check your skin and feet every day for cuts, bruises, redness, blisters, or sores.  Do not use any products that contain nicotine or tobacco, such as cigarettes and e-cigarettes. If you need help quitting, ask your health care provider. This information is not intended to replace advice given to you by your health care provider. Make sure you discuss any questions you have with your health care provider. Document Revised: 12/18/2017 Document Reviewed: 12/10/2016 Elsevier Patient Education  2020 [employer].

## 2020-11-21 NOTE — Discharge Summary (Signed)
Physician Discharge Summary  Richard Mendoza ZOX:[id number] DOB: 06-23-1976 DOA: 11/07/2020  PCP: Tracie Harrier, MD  Admit date: 11/07/2020 Discharge date: 11/22/2020  Admitted From: home Disposition:  home  Recommendations for Outpatient Follow-up:  Follow up with PCP in 1-2 weeks, with vascular as arranged Please obtain BMP/CBC in one week Please follow up on the following pending results:  Home Health:no  Equipment/Devices: none  Discharge Condition: Stable Code Status:   Code Status: Prior Diet recommendation:  Diet Order             Diet Carb Modified                  Brief/Interim Summary: 45 year old male with type II uncontrolled diabetes on insulin pump, hypertension, dyslipidemia presents to the ED with left foot infection acute, worsening, was referred by podiatrist.  Patient endorsed uncontrolled blood sugar level, also nausea, fever and chills at home.  He was on doxycycline by Dr. Vickki Muff but infection was not improving. He in the ED he was febrile and lab with a leukocytosis, COVID-19 negative, left foot x-ray diffuse subcutaneous gas along the plantar foot extending to the third toe concerning for necrotizing fasciitis, patient was seen by Dr. 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 His hemoglobin had dropped and needing 1 unit PRBC transfusion on 12/29 and 12/31, and due to drifting hemoglobin at this point pelvis are for 11/21/2020. Patient is wanting to go home and home health is being set up discussed with vascular and ID and okay for discharge home after blood transfusion. Requesting to be discharged home today after blood transfusion.  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  Appreciate ID input monitor how the wound is healing, patient having bleeding from the areas dressing being changed by vascular, PT OT.  Today with IV Unasyn and discontinue 11/21/2020 as per ID and vascular.  Pain management postop as per vascular and has been sent to the pharmacy and he will also contact us pain clinic.  Patient was advised to go to skilled nursing facility but at this time he wants to go to home and home health is being arranged.  Vascular surgery aware and okay to discharge to home with home health.  Group B Streptococcus sepsis POA also Prevotella bacteremia in anaerobic culture: Patient met sepsis criteria leukocytosis 11.8, fever 102 on admission.   Antibiotic discontinued by ID 11/3.   Acute blood loss anemia in the setting of anemia from chronic disease: Blood loss in the setting of amputation.Received 2 units total - on 12/29 and 12/31 and 1 unit 11/22/19 and hh posttransfusion improved kilogram..  Advised to take CBC in 4 to 5 days from primary care doctor.  Patient reports he had normal colonoscopy 2 years ago.  He denies any black or brown stool, no hematemesis nausea vomiting or epigastric pain.  If continues to have a low hemoglobin he will need further work-up possible hemolytic/GI referral possible hem eval/ gi eval.  GERD continue PPI   Hypertension: BP  fairly stable likely multifactorial postop and pain issues. home lisinopril dose increased to 20 mg 1/1.  Continue same.   Diabetes mellitus on insulin pump at home, poorly controlled/nonketotic hyperosmolar hyperglycemia: Last hemoglobin A1c 12, off insulin drip.    Insulin regimen adjusted while here with Lantus Premeal insulin sliding scale.  Resume home regimen  upon discharge.  Marland Kitchen Here  On 90 units lantus , Premeal insulin 12 units and sliding scale insulin.  He is on Reglan.  DM coordinator following.  CKD IIIa with mild ZOX:[id number] slightly up at 1.2 discontinue Toradol. Monitor.Creat stable. Recent Labs  Lab 11/17/20 0400 11/18/20 0432 11/19/20 0425 11/20/20 0427 11/21/20 0408  BUN _0 24* 22*  CREATININE 1.03 0.96 1.13 1.25* 1.09    Hyperlipidemia continue statin   Low back pain history of right paracentral disc protrusion at L5-S1, CT L-spine no acute finding, continue pain control. B12 deficiency on B12 supplement Peripheral neuropathy/chronic pain, on Neurontin.  Takes Vicodin at home as well. Headache, migraine type retro-orbital but no history of migraine, Imitrex 25 mg p.o. every 2 hours as needed consider subcommittees versus Fioricet if ineffective. Hyponatremia likely pseudohyponatremia from hyperglycemia.  Monitor Hypokalemia resolved Anxiety disorder: he is on BuSpar and Zoloft. Morbid Obesity weight body mass index is 32 kg/m.  Will benefit with weight loss  Patient refusing to go to skilled nursing facility after vascular discussed with them, despite understanding risk of fall and other complications in the setting of his BKA.  He is being set up with home health.  Consults: ID,VASCULAR  Subjective: Alert,awake, no new complaints. Looking forward to going home today hemoglobin is low and agrees for transfusion.  Discharge Exam: Vitals:   11/21/20 1625 11/21/20 1627  BP: (!) 178/92 (!) 178/92  Pulse: 79 79  Resp: 18 18  Temp: 98.2 F (36.8 C) 98.2 F (36.8 C)  SpO2: 99% 99%   General: Pt is alert, awake, not in acute distress Cardiovascular: RRR, S1/S2 +, no rubs, no gallops Respiratory: CTA bilaterally, no wheezing, no rhonchi Abdominal: Soft, NT, ND, bowel sounds + Extremities: no edema, no cyanosis  Discharge Instructions  Discharge Instructions     Diet Carb Modified   Complete by: As  directed    Discharge instructions   Complete by: As directed    Please call call MD or return to ER for similar or worsening recurring problem that brought you to hospital or if any fever,nausea/vomiting,abdominal pain, uncontrolled pain, chest pain,  shortness of breath or any other alarming symptoms.  Please follow-up your doctor as instructed in a week time and call the office for appointment.  Please avoid alcohol, smoking, or any other illicit substance and maintain healthy habits including taking your regular medications as prescribed.  You were cared for by a hospitalist during your hospital stay. If you have any questions about your discharge medications or the care you received while you were in the hospital after you are discharged, you can call the unit and ask to speak with the hospitalist on call if the hospitalist that took care of you is not available.  Once you are discharged, your primary care physician will handle any further medical issues. Please note that NO REFILLS for any discharge medications will be authorized once you are discharged, as it is imperative that you return to your primary care physician (or establish a relationship with a primary care physician if you do not have one) for your aftercare needs so that they can reassess  your need for medications and monitor your lab values   Discharge wound care:   Complete by: As directed    ABD to incision line, covered by Kerlix, covered by Ace'   Increase activity slowly   Complete by: As directed       Allergies as of 11/21/2020   No Known Allergies      Medication List     STOP taking these medications    oxyCODONE-acetaminophen 5-325 MG tablet Commonly known as: PERCOCET/ROXICET       TAKE these medications    atorvastatin 40 MG tablet Commonly known as: LIPITOR Take 40 mg by mouth daily.   busPIRone 15 MG tablet Commonly known as: BUSPAR Take 15 mg by mouth 2 (two) times daily.   [employer] Senna 8.6  MG tablet Generic drug: senna Take 1 tablet by mouth 2 (two) times daily as needed for constipation.   dicyclomine 10 MG capsule Commonly known as: BENTYL Take 10 mg by mouth in the morning and at bedtime.   famotidine 20 MG tablet Commonly known as: PEPCID Take 20 mg by mouth daily.   fenofibrate 48 MG tablet Commonly known as: TRICOR Take 48 mg by mouth daily.   ferrous sulfate 325 (65 FE) MG tablet Take 325 mg by mouth 2 (two) times daily with a meal.   Fiasp 100 UNIT/ML Soln Generic drug: Insulin Aspart (w/Niacinamide) Inject into the skin continuous. Via pump   furosemide 40 MG tablet Commonly known as: LASIX Take 40 mg by mouth daily.   gabapentin 300 MG capsule Commonly known as: NEURONTIN Take 600 mg by mouth at bedtime.   linaclotide 72 MCG capsule Commonly known as: LINZESS Take 72 mcg by mouth daily as needed (constipation).   lisinopril 20 MG tablet Commonly known as: ZESTRIL Take 1 tablet (20 mg total) by mouth daily. What changed:  medication strength how much to take   MAGNESIUM PO Take 1 tablet by mouth daily.   metoCLOPramide 10 MG tablet Commonly known as: REGLAN Take 10 mg by mouth 3 (three) times daily as needed for nausea or vomiting.   multivitamin with minerals Tabs tablet Take 1 tablet by mouth daily.   ondansetron 8 MG tablet Commonly known as: ZOFRAN Take 8 mg by mouth every 6 (six) hours as needed for nausea/vomiting.   oxyCODONE 5 MG immediate release tablet Commonly known as: Oxy IR/ROXICODONE Take 1 tablet (5 mg total) by mouth every 4 (four) hours as needed for moderate pain or severe pain.   pantoprazole 40 MG tablet Commonly known as: PROTONIX Take 40 mg by mouth daily.   pioglitazone 30 MG tablet Commonly known as: ACTOS Take 30 mg by mouth daily.   POTASSIUM PO Take 1 capsule by mouth in the morning and at bedtime.   sertraline 100 MG tablet Commonly known as: ZOLOFT Take 100 mg by mouth daily.   sildenafil  20 MG tablet Commonly known as: REVATIO Take 100 mg by mouth daily as needed (erectile dysfunction).   traZODone 50 MG tablet Commonly known as: DESYREL Take 50 mg by mouth at bedtime.   vitamin B-12 1000 MCG tablet Commonly known as: CYANOCOBALAMIN Take 1,000 mcg by mouth in the morning and at bedtime.               Discharge Care Instructions  (From admission, onward)           Start     Ordered   11/21/20 0000  Discharge wound care:  Comments: ABD to incision line, covered by Kerlix, covered by Ace'   11/21/20 1455            Follow-up Information     Dew, Erskine Squibb, MD Follow up in 2 week(s).   Specialties: Vascular Surgery, Radiology, Interventional Cardiology Why: Can see Arna Medici.  First postop visit.  No studies needed. Contact information: [place] [address] [phone]         Tracie Harrier, MD Follow up in 1 week(s).   Specialty: Internal Medicine Contact information: [address] [place] [address] [phone]                No Known Allergies  The results of significant diagnostics from this hospitalization (including imaging, microbiology, ancillary and laboratory) are listed below for reference.    Microbiology: No results found for this or any previous visit (from the past 240 hour(s)).  Procedures/Studies: CT LUMBAR SPINE W CONTRAST  Result Date: 11/08/2020 CLINICAL DATA:  Low back pain, infection suspected. 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 Diabetes. Severe low back pain. EXAM: CT LUMBAR SPINE WITH CONTRAST TECHNIQUE: Multidetector CT imaging of the lumbar spine was performed with intravenous contrast administration. CONTRAST:  111m OMNIPAQUE IOHEXOL 300 MG/ML  SOLN COMPARISON:  MRI of lumbar spine 10/15/2018 FINDINGS: Segmentation: 5 non rib-bearing lumbar type vertebral bodies are present. The lowest fully formed vertebral body is L5. Alignment: No significant  listhesis is present. Lumbar lordosis is preserved. Vertebrae: Vertebral body heights maintained. No focal lytic or blastic lesions are present. Paraspinal and other soft tissues: Paraspinous soft tissues are unremarkable. No focal inflammatory change or fluid collection is present. Visualized portions the abdomen are unremarkable. Disc levels: T12-L1: Mild facet hypertrophy is present. No significant disc disease or stenosis is present. L1-2: Facet hypertrophy is present. Minimal disc bulging is present. No significant stenosis is present. L2-3: Mild broad-based disc bulge is present. Moderate facet hypertrophy is noted bilaterally. Mild foraminal narrowing is present bilaterally. L3-4: Broad-based disc protrusion is present. Moderate facet hypertrophy has progressed. This results in mild central and bilateral foraminal stenosis. L4-5: Broad-based disc protrusion is present. Moderate facet hypertrophy is noted bilaterally. Moderate central canal narrowing is worse left than right. Moderate bilateral foraminal narrowing is again seen. L5-S1: Right paramedian disc protrusion is present with calcification. Severe right subarticular stenosis is again seen. Moderate to severe right and moderate left foraminal narrowing is present. SI joints are fused bilaterally. No significant intracanalicular enhancement is evident. IMPRESSION: 1. Progressive multilevel spondylosis of the lumbar spine as described. 2. 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 3. Moderate central canal narrowing at L4-5 is worse left than right. 4. Moderate bilateral foraminal narrowing at L4-5. 5. Progressive mild central and bilateral foraminal stenosis at L3-4. 6. Fusion of the SI joints bilaterally. 7. No focal soft tissue enhancement or fluid collection to suggest infection. If clinical concern persists, MRI more sensitive for evaluation of soft tissues in the spinal canal and foramina. Electronically  Signed   By: CSan MorelleM.D.   On: 11/08/2020 15:21   MR FOOT LEFT W WO CONTRAST  Result Date: 11/10/2020 CLINICAL DATA:  Diabetic left foot wound. Left third toe amputation with partial ray resection with wound debridement on 11/07/2020. EXAM: MRI OF THE LEFT FOREFOOT WITHOUT AND WITH CONTRAST TECHNIQUE: Multiplanar, multisequence MR imaging of the left forefoot was performed both before and after administration of intravenous contrast. CONTRAST:  120mGADAVIST GADOBUTROL 1  MMOL/ML IV SOLN COMPARISON:  X-ray 11/07/2020 FINDINGS: Bones/Joint/Cartilage Interval third toe amputation with partial third ray resection at the level of the third metatarsal neck. There is marrow edema throughout the residual third metatarsal diaphysis with preservation of the fatty T1 marrow signal, likely reactive. There is marrow edema and enhancement with low T1 marrow signal changes along the lateral margin of the second metatarsal head (series 7, images 15-16; series 3, image 23) suspicious for osteomyelitis. There is mild marrow edema along the base of the second toe proximal phalanx as well as the fourth metatarsal head and base of fourth toe proximal phalanx without definite cortical destruction or low T1 marrow signal changes. Findings may reflect reactive osteitis. Early acute osteomyelitis at these locations is not excluded. Small second and fourth MTP joint effusions. Remaining osseous structures of the forefoot are within normal limits. No fractures. No dislocation. Ligaments Intact Lisfranc ligament. Collateral ligaments of the remaining metatarsophalangeal joints appear intact. Muscles and Tendons Diffuse edema-like signal throughout the intrinsic foot musculature suggestive of a myositis. No tenosynovial fluid collections. Soft tissues Surgical defect at the site of third toe amputation with ill-defined fluid and air within the surgical bed. There is a tract of air and fluid with peripherally enhancing margins  along the plantar aspect of the forefoot extending from the level of the third metatarsal head and propagating proximally and medially to the level of the midfoot, likely site of surgical incision/debridement (series 14, images 14-20). Otherwise, there are no organized or rim enhancing fluid collections. IMPRESSION: 1. Interval third toe amputation with partial third ray resection at the level of the third metatarsal neck. Marrow edema throughout the residual third metatarsal diaphysis is likely reactive. 2. Marrow edema and enhancement along the lateral margin of the second metatarsal head is suspicious for acute osteomyelitis. 3. Mild marrow edema along the base of the second toe proximal phalanx as well as the fourth metatarsal head and base of the fourth toe proximal phalanx without definite cortical destruction or marrow replacement. Findings may reflect reactive osteitis. Early acute osteomyelitis at these locations is not excluded. 4. Small second and fourth MTP joint effusions, which may be reactive or reflect septic arthritis. 5. Extensive postsurgical changes from debridement of the plantar soft tissues of the left forefoot. No organized fluid collection or abscess. 6. Diffuse edema-like signal throughout the intrinsic foot musculature suggestive of a myositis. Electronically Signed   By: Davina Poke D.O.   On: 11/10/2020 12:58   DG Foot 2 Views Left  Result Date: 11/07/2020 CLINICAL DATA:  45 year old male with left foot infection. EXAM: LEFT FOOT - 2 VIEW COMPARISON:  None. FINDINGS: There is no acute fracture or dislocation. There is mild osteopenia and mild hallux valgus. No periosteal elevation or bone erosion to suggest osteomyelitis. There is diffuse subcutaneous gas along the plantar foot extending into the third toe. There is diffuse soft tissue swelling and subcutaneous edema. IMPRESSION: 1. No acute fracture or dislocation. 2. Diffuse subcutaneous gas along the plantar foot extending  into the third toe. Findings concerning for necrotizing fasciitis. Electronically Signed   By: Anner Crete M.D.   On: 11/07/2020 18:25     Labs: BNP (last 3 results) No results for input(s): BNP in the last 8760 hours. Basic Metabolic Panel: Recent Labs  Lab 11/17/20 0400 11/18/20 0432 11/19/20 0425 11/20/20 0427 11/21/20 0408  NA 132* 135 136 138 138  K 4.1 3.9 3.9 4.0 4.1  CL 100 103 104 106 106  CO2  _0 GLUCOSE 316* 276* 267* 281* 232*  BUN _1 24* 22*  CREATININE 1.03 0.96 1.13 1.25* 1.09  CALCIUM 8.2* 8.0* 8.3* 8.3* 8.1*   Liver Function Tests: No results for input(s): AST, ALT, ALKPHOS, BILITOT, PROT, ALBUMIN in the last 168 hours. No results for input(s): LIPASE, AMYLASE in the last 168 hours. No results for input(s): AMMONIA in the last 168 hours. CBC: Recent Labs  Lab 11/16/20 0432 11/17/20 0400 11/18/20 0432 11/18/20 1420 11/19/20 0425 11/20/20 0427 11/21/20 0408 11/21/20 1653  WBC 11.6* 15.3* 10.3  --  10.2 9.1 10.8*  --   NEUTROABS 8.2*  --   --   --   --   --   --   --   HGB 7.5* 7.5* 6.9* 7.8* 7.6* 7.3* 7.1* 8.0*  HCT 24.5* 23.7* 22.6* 25.2* 23.7* 23.8* 23.9* 26.1*  MCV 88.1 86.2 88.6  --  85.9 87.5 87.9  --   PLT 408* 442* 452*  --  469* 451* 460*  --    Cardiac Enzymes: No results for input(s): CKTOTAL, CKMB, CKMBINDEX, TROPONINI in the last 168 hours. BNP: Invalid input(s): POCBNP CBG: Recent Labs  Lab 11/20/20 1646 11/20/20 2145 11/21/20 0741 11/21/20 1117 11/21/20 1639  GLUCAP 132* 215* 170* 329* 193*   D-Dimer No results for input(s): DDIMER in the last 72 hours. Hgb A1c No results for input(s): HGBA1C in the last 72 hours. Lipid Profile No results for input(s): CHOL, HDL, LDLCALC, TRIG, CHOLHDL, LDLDIRECT in the last 72 hours. Thyroid function studies No results for input(s): TSH, T4TOTAL, T3FREE, THYROIDAB in the last 72 hours.  Invalid input(s): FREET3 Anemia work up No results for input(s): VITAMINB12,  FOLATE, FERRITIN, TIBC, IRON, RETICCTPCT in the last 72 hours. Urinalysis    Component Value Date/Time   COLORURINE STRAW (A) 11/07/2020 1749   APPEARANCEUR CLEAR (A) 11/07/2020 1749   LABSPEC 1.027 11/07/2020 1749   PHURINE 6.0 11/07/2020 1749   GLUCOSEU >=500 (A) 11/07/2020 1749   HGBUR MODERATE (A) 11/07/2020 1749   BILIRUBINUR NEGATIVE 11/07/2020 1749   KETONESUR NEGATIVE 11/07/2020 1749   PROTEINUR 30 (A) 11/07/2020 1749   UROBILINOGEN 0.2 07/31/2011 2217   NITRITE NEGATIVE 11/07/2020 1749   LEUKOCYTESUR NEGATIVE 11/07/2020 1749   Sepsis Labs Invalid input(s): PROCALCITONIN,  WBC,  LACTICIDVEN Microbiology No results found for this or any previous visit (from the past 240 hour(s)).   Time coordinating discharge: 35 minutes  SIGNED: Antonieta Pert, MD  [facility] 11/22/2020, 11:24 AM  If 7PM-7AM, please contact night-coverage [email or web address]

## 2020-11-21 NOTE — Progress Notes (Signed)
Physical Therapy Treatment Patient Details Name: Richard Mendoza MRN: [id number] DOB: October 15, 1976 Today's Date: 11/21/2020    History of Present Illness Patient is a 45 year old male who presents to ER with acute onset of worsening left foot infection being referred by his podiatrist. Upon arrival blood glucose is significantly elevated as well as temperature and HR.  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  Stated he continues to walk in his room with RW on his own without incident. Pt has been educated to call for assist but continues to choose to do it on his own.  He is able to get OOB and to wheelchair without assist.  Ind wheelchair mobility to and from gym with education on brake management and awareness for safety.  He is able to go up/down 3 steps backwards with walker and cues.  Overall does well.  Discussed safety and having assist of wife and sons at home for safety.  Discussed awareness of step safety given snowy wet weather today.  Voiced understanding.  Pt will have multiple family members at home along with their friends to assist as needed.  Pt declined further gait stating his R foot was getting sore from walking on our hard floors.  He does not have a shoe accessible to try.  Stated he was comfortable with home discharge today.   Patient suffers from BKAwhich impairs his/her ability to perform daily activities like toileting, feeding, dressing, grooming, bathing in the home. A cane, walker, crutch will not resolve the patient's issue with performing activities of daily living. A lightweight wheelchair and cushion is required/recommended and will allow patient to safely perform daily activities.   Patient can safely propel the wheelchair in the home or has a caregiver who can provide assistance.    Follow Up Recommendations  Home health PT;Supervision for  mobility/OOB     Equipment Recommendations  Rolling walker with 5" wheels;3in1 (PT);Wheelchair (measurements PT);Wheelchair cushion (measurements PT)    Recommendations for Other Services       Precautions / Restrictions Precautions Precautions: Fall Restrictions Weight Bearing Restrictions: Yes LLE Weight Bearing: Non weight bearing    Mobility  Bed Mobility Overal bed mobility: Modified Independent                Transfers Overall transfer level: Modified independent                  Ambulation/Gait Ambulation/Gait assistance: Supervision;Modified independent (Device/Increase time) Gait Distance (Feet): 10 Feet Assistive device: Rolling walker (2 wheeled)   Gait velocity: decreased       Stairs Stairs: Yes Stairs assistance: Min assist;+2 safety/equipment Stair Management: Backwards;With walker Number of Stairs: 3     Wheelchair Mobility    Modified Rankin (Stroke Patients Only)       Balance Overall balance assessment: Needs assistance Sitting-balance support: No upper extremity supported;Feet unsupported Sitting balance-Leahy Scale: Good     Standing balance support: During functional activity;Bilateral upper extremity supported Standing balance-Leahy Scale: Good                              Cognition Arousal/Alertness: Awake/alert Behavior During Therapy: WFL for tasks assessed/performed Overall Cognitive Status: Within Functional Limits for tasks assessed  Exercises      General Comments        Pertinent Vitals/Pain Pain Assessment: No/denies pain    Home Living                      Prior Function            PT Goals (current goals can now be found in the care plan section) Progress towards PT goals: Progressing toward goals    Frequency    7X/week      PT Plan Current plan remains appropriate    Co-evaluation               AM-PAC PT "6 Clicks" Mobility   Outcome Measure  Help needed turning from your back to your side while in a flat bed without using bedrails?: None Help needed moving from lying on your back to sitting on the side of a flat bed without using bedrails?: None Help needed moving to and from a bed to a chair (including a wheelchair)?: None Help needed standing up from a chair using your arms (e.g., wheelchair or bedside chair)?: None Help needed to walk in hospital room?: A Little Help needed climbing 3-5 steps with a railing? : A Little 6 Click Score: 22    End of Session Equipment Utilized During Treatment: Gait belt Activity Tolerance: Patient tolerated treatment well Patient left: in chair;with nursing/sitter in room;with call bell/phone within reach Nurse Communication: Mobility status Pain - Right/Left: Left Pain - part of body: Leg     Time: 0354-6568 PT Time Calculation (min) (ACUTE ONLY): 17 min  Charges:  $Gait Training: 8-22 mins                    Danielle Dess, PTA 11/21/20, 11:01 AM

## 2020-11-22 ENCOUNTER — Other Ambulatory Visit (INDEPENDENT_AMBULATORY_CARE_PROVIDER_SITE_OTHER): Payer: Self-pay | Admitting: Vascular Surgery

## 2020-11-22 LAB — TYPE AND SCREEN
ABO/RH(D): O POS
Antibody Screen: NEGATIVE
Unit division: 0

## 2020-11-22 LAB — BPAM RBC
Blood Product Expiration Date: 202201172359
ISSUE DATE / TIME: 202201031344
Unit Type and Rh: 5100

## 2020-11-23 ENCOUNTER — Telehealth (INDEPENDENT_AMBULATORY_CARE_PROVIDER_SITE_OTHER): Payer: Self-pay

## 2020-11-23 NOTE — Telephone Encounter (Signed)
I tried to transfer pt's wife to an available person to schedule an appointment but she hung up saying she was on hold previously an would just call back

## 2020-11-23 NOTE — Telephone Encounter (Signed)
Sure

## 2020-11-23 NOTE — Telephone Encounter (Signed)
I called and made the Home care nurse aware of the PA 's instructions.

## 2020-11-23 NOTE — Telephone Encounter (Signed)
Per the pt chart he is not on a blood thinner and I made the Pt's Wife aware of this and She agreed that she will address this at his next visit.

## 2020-11-23 NOTE — Telephone Encounter (Signed)
A verbal order for all physical therapy is fine.  We do not prescribe any of the patient's medications with the exception of his blood thinners.  She will need to reach out to his primary care physician for the Robaxin (that is a muscle relaxer).

## 2020-11-23 NOTE — Telephone Encounter (Addendum)
Ceilia  From [facility] called and left a VM on the nurse's line wanting to know could  She get a verbal order to do PT for pt two times a week for one week and 1 time a week for 3 weeks she also said the pt needs and order to have home health come out to do wound care  And a Rx for Robaxin. Please advise.

## 2020-11-23 NOTE — Telephone Encounter (Signed)
I called and spoke to the pt's wife and made her aware of the PA 's instructions  she wanted to know does can he have a refill on his blood thinner.

## 2020-11-23 NOTE — Telephone Encounter (Signed)
The pt's home care nurse called and left a VM on the nurses line wanting to know could she have a verbal order to do wound care on the pt an wraps. Please advise.

## 2020-11-29 NOTE — Anesthesia Postprocedure Evaluation (Signed)
Anesthesia Post Note  Patient: Richard Mendoza  Procedure(s) Performed: AMPUTATION BELOW KNEE (Left Knee)  Patient location during evaluation: PACU Anesthesia Type: General Level of consciousness: awake and alert and oriented Pain management: pain level controlled Vital Signs Assessment: post-procedure vital signs reviewed and stable Respiratory status: spontaneous breathing Cardiovascular status: blood pressure returned to baseline Anesthetic complications: no   No complications documented.   Last Vitals:  Vitals:   11/21/20 1625 11/21/20 1627  BP: (!) 178/92 (!) 178/92  Pulse: 79 79  Resp: 18 18  Temp: 36.8 C 36.8 C  SpO2: 99% 99%    Last Pain:  Vitals:   11/21/20 1811  TempSrc:   PainSc: 5                  Plez Belton

## 2020-12-02 DIAGNOSIS — E1065 Type 1 diabetes mellitus with hyperglycemia: Secondary | ICD-10-CM | POA: Diagnosis not present

## 2020-12-09 ENCOUNTER — Ambulatory Visit (INDEPENDENT_AMBULATORY_CARE_PROVIDER_SITE_OTHER): Payer: Medicaid Other | Admitting: Nurse Practitioner

## 2020-12-09 ENCOUNTER — Encounter (INDEPENDENT_AMBULATORY_CARE_PROVIDER_SITE_OTHER): Payer: Self-pay | Admitting: Nurse Practitioner

## 2020-12-09 ENCOUNTER — Other Ambulatory Visit: Payer: Self-pay

## 2020-12-09 VITALS — BP 165/99 | HR 92 | Resp 16

## 2020-12-09 DIAGNOSIS — R1033 Periumbilical pain: Secondary | ICD-10-CM | POA: Insufficient documentation

## 2020-12-09 DIAGNOSIS — R194 Change in bowel habit: Secondary | ICD-10-CM | POA: Insufficient documentation

## 2020-12-09 DIAGNOSIS — E785 Hyperlipidemia, unspecified: Secondary | ICD-10-CM

## 2020-12-09 DIAGNOSIS — R1013 Epigastric pain: Secondary | ICD-10-CM | POA: Insufficient documentation

## 2020-12-09 DIAGNOSIS — K5900 Constipation, unspecified: Secondary | ICD-10-CM | POA: Insufficient documentation

## 2020-12-09 DIAGNOSIS — R634 Abnormal weight loss: Secondary | ICD-10-CM | POA: Insufficient documentation

## 2020-12-09 DIAGNOSIS — Z89512 Acquired absence of left leg below knee: Secondary | ICD-10-CM

## 2020-12-09 DIAGNOSIS — K5903 Drug induced constipation: Secondary | ICD-10-CM | POA: Insufficient documentation

## 2020-12-09 DIAGNOSIS — E1169 Type 2 diabetes mellitus with other specified complication: Secondary | ICD-10-CM

## 2020-12-09 DIAGNOSIS — K760 Fatty (change of) liver, not elsewhere classified: Secondary | ICD-10-CM | POA: Insufficient documentation

## 2020-12-09 NOTE — Progress Notes (Addendum)
Subjective:    Patient ID: Richard Mendoza, male    DOB: June 02, 1976, 45 y.o.   MRN: [id number] Chief Complaint  Patient presents with  . Follow-up    [facility] 2 week post amputation left bka    The patient presents today following left below-knee amputation on 11/16/2020.  He was admitted to [facility] 12/09/2019 with a left diabetic foot abscess.  He subsequently underwent extensive debridement any drainage of his left foot however the damage was found to be quite extensive and ultimately the foot was not salvageable.  Today, the patient has been working with physical therapy and is able to keep his knee extremely straight.  He is doing very well in that regard.  The incision is clean dry and intact with just a small area of redness near the lateral portion.  There is no areas of drainage.  There are several scabbed areas but it is noted that the patient had issues with bleeding postoperatively.  Overall the incision looks good with no signs symptoms of infection.     Review of Systems  Neurological: Positive for weakness.  All other systems reviewed and are negative.      Objective:   Physical Exam Vitals reviewed.  HENT:     Head: Normocephalic.  Cardiovascular:     Rate and Rhythm: Normal rate.  Pulmonary:     Effort: Pulmonary effort is normal.  Musculoskeletal:     Left Lower Extremity: Left leg is amputated below knee.  Neurological:     Mental Status: He is alert and oriented to person, place, and time.     Motor: Weakness present.     Gait: Gait abnormal.  Psychiatric:        Mood and Affect: Mood normal.        Behavior: Behavior normal.        Thought Content: Thought content normal.        Judgment: Judgment normal.     BP (!) 165/99 (BP Location: Left Arm)   Pulse 92   Resp 16   Past Medical History:  Diagnosis Date  . DIABETES MELLITUS, TYPE II, UNCONTROLLED 03/17/2009  . DM 12/08/2008  . HYPERLIPIDEMIA 03/17/2009  . HYPERTENSION  12/08/2008  . 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 Marital status: Married    Spouse name: Not on file  . Number of children: 4  . Years of education: Not on file  . Highest education level: Not on file  Occupational History  . Not on file  Tobacco Use  . Smoking status: Former Smoker    Packs/day: 1.00    Years: 17.00    Pack years: 17.00    Types: Cigarettes    Quit date: 01/14/2019    Years since quitting: 1.9  . Smokeless tobacco: Never Used  Vaping Use  . Vaping Use: Never used  Substance and Sexual Activity  . Alcohol use: Yes    Comment: rare  . Drug use: No  . Sexual activity: Yes  Other Topics Concern  . Not on file  Social History Narrative  . Not on file   Social Determinants of Health   Financial Resource Strain: Not on file  Food Insecurity: Not on file  Transportation Needs: Not on file  Physical Activity: Not on file  Stress: Not on file  Social Connections: Not on file  Intimate Partner Violence: Not on file    Past Surgical History:  Procedure  Laterality Date  . AMPUTATION Left 11/07/2020   Procedure: 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;  Surgeon: Sharlotte Alamo, DPM;  Location: [facility];  Service: Podiatry;  Laterality: Left;  . AMPUTATION Left 11/16/2020   Procedure: AMPUTATION BELOW KNEE;  Surgeon: Algernon Huxley, MD;  Location: [facility];  Service: Vascular;  Laterality: Left;  . ANTERIOR CERVICAL DECOMP/DISCECTOMY FUSION N/A 09/09/2017   Procedure: ANTERIOR CERVICAL DECOMPRESSION/DISCECTOMY FUSION CERVICAL 6- CERVICAL 7;  Surgeon: Ashok Pall, MD;  Location: [place];  Service: Neurosurgery;  Laterality: N/A;  ANTERIOR CERVICAL DECOMPRESSION/DISCECTOMY FUSION CERVICAL 6- CERVICAL 7  . APPENDECTOMY    . I & D EXTREMITY Right 10/03/2017   Procedure: IRRIGATION AND DEBRIDEMENT RIGHT WRIST;  Surgeon: Leanora Cover, MD;  Location: [place];  Service: Orthopedics;  Laterality: Right;  . I & D EXTREMITY  Right 11/26/2018   Procedure: IRRIGATION AND DEBRIDEMENT FASCIA ON RIGHT FOOT;  Surgeon: Samara Deist, DPM;  Location: [facility];  Service: Podiatry;  Laterality: Right;  . 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 INCISION AND DRAINAGE Left 11/07/2020   Procedure: INCISION AND DRAINAGE;  Surgeon: Sharlotte Alamo, DPM;  Location: [facility];  Service: Podiatry;  Laterality: Left;  . IRRIGATION AND DEBRIDEMENT FOOT Left 11/11/2020   Procedure: IRRIGATION AND DEBRIDEMENT FOOT;  Surgeon: Sharlotte Alamo, DPM;  Location: [facility];  Service: Podiatry;  Laterality: Left;  . METATARSAL HEAD EXCISION Right 05/15/2019   Procedure: OSTECTOMY;MET HEAD 5;  Surgeon: Samara Deist, DPM;  Location: [facility];  Service: Podiatry;  Laterality: Right;  . osteomylitis    . ROTATOR CUFF REPAIR Left     Family History  Problem Relation Age of Onset  . Diabetes Mother   . Heart disease Father   . Diabetes Father   . Arthritis Other   . Hyperlipidemia Other   . Hypertension Other   . Cancer Other        breast  . Mental illness Neg Hx     No Known Allergies  CBC Latest Ref Rng & Units 11/21/2020 11/21/2020 11/20/2020  WBC 4.0 - 10.5 K/uL - 10.8(H) 9.1  Hemoglobin 13.0 - 17.0 g/dL 8.0(L) 7.1(L) 7.3(L)  Hematocrit 39.0 - 52.0 % 26.1(L) 23.9(L) 23.8(L)  Platelets 150 - 400 K/uL - 460(H) 451(H)      CMP     Component Value Date/Time   NA 138 11/21/2020 0408   K 4.1 11/21/2020 0408   CL 106 11/21/2020 0408   CO2 24 11/21/2020 0408   GLUCOSE 232 (H) 11/21/2020 0408   BUN 22 (H) 11/21/2020 0408   CREATININE 1.09 11/21/2020 0408   CALCIUM 8.1 (L) 11/21/2020 0408   PROT 8.5 (H) 11/07/2020 1758   ALBUMIN 2.7 (L) 11/07/2020 1758   AST 25 11/07/2020 1758   ALT 17 11/07/2020 1758   ALKPHOS 134 (H) 11/07/2020 1758   BILITOT 1.1 11/07/2020 1758   GFRNONAA  >60 11/21/2020 0408   GFRAA >60 05/05/2020 1930     No results found.     Assessment & Plan:   1. S/P BKA (below knee amputation), left ([place]) The patient's left below-knee amputation is healing well.  Today all of the patient's staples were removed and Steri-Strips applied.  There is several areas of scabbing that appear to be superficial.  Patient is advised that some drainage may occur post  staple removal and that is normal.  Serous sanguinous or even slightly bloody drainage is normal however the purulent drainage is not.  They are advised to contact office if he begins to have purulent drainage for if there is extensive redness, pain or the wound shows signs and symptoms of dehiscence.   At this time the patient is doing an excellent job of keeping his knee straight.  The patient is ready to begin use of a shrinker sock as well as a prosthetic.  We will send a referral over for the patient.  We will plan on having the patient return in 6 weeks for evaluation of wound healing.  The patient should also have a transport chair due to his left below-knee amputation.  Because of his amputation it limits his mobility and stops him from being able to safely complete ADLs.  Currently he cannot ambulate more than 25 feet without an assistive device.  It is very difficult to use things such as a walker.  He is unable to saw propel his wheelchair and requires a transport chair.  His wife has been very diligent in his care and is able and willing as well as available to provide assistance with his transport chair.  2. Hyperlipidemia associated with type 2 diabetes mellitus ([place]) Continue statin as ordered and reviewed, no changes at this time    Current Outpatient Medications on File Prior to Visit  Medication Sig Dispense Refill  . atorvastatin (LIPITOR) 40 MG tablet Take 40 mg by mouth daily.   3  . busPIRone (BUSPAR) 15 MG tablet Take 15 mg by mouth 2 (two) times daily.    Marland Kitchen dicyclomine (BENTYL)  10 MG capsule Take 10 mg by mouth in the morning and at bedtime.    . famotidine (PEPCID) 20 MG tablet Take 20 mg by mouth daily.    . fenofibrate (TRICOR) 48 MG tablet Take 48 mg by mouth daily.    . ferrous sulfate 325 (65 FE) MG tablet Take 325 mg by mouth 2 (two) times daily with a meal.    . FIASP 100 UNIT/ML SOLN Inject into the skin continuous. Via pump    . furosemide (LASIX) 40 MG tablet Take 40 mg by mouth daily.    Marland Kitchen linaclotide (LINZESS) 72 MCG capsule Take 72 mcg by mouth daily as needed (constipation).    Marland Kitchen lisinopril (ZESTRIL) 20 MG tablet Take 1 tablet (20 mg total) by mouth daily. 30 tablet 0  . MAGNESIUM PO Take 1 tablet by mouth daily.    . metoCLOPramide (REGLAN) 10 MG tablet Take 10 mg by mouth 3 (three) times daily as needed for nausea or vomiting.    . ondansetron (ZOFRAN) 8 MG tablet Take 8 mg by mouth every 6 (six) hours as needed for nausea/vomiting.    Marland Kitchen oxyCODONE (OXY IR/ROXICODONE) 5 MG immediate release tablet Take 1 tablet (5 mg total) by mouth every 4 (four) hours as needed for moderate pain or severe pain. 28 tablet 0  . pantoprazole (PROTONIX) 40 MG tablet Take 40 mg by mouth daily.  5  . pioglitazone (ACTOS) 30 MG tablet Take 30 mg by mouth daily.     Marland Kitchen POTASSIUM PO Take 1 capsule by mouth in the morning and at bedtime.    . sertraline (ZOLOFT) 100 MG tablet Take 100 mg by mouth daily.   5  . sildenafil (REVATIO) 20 MG tablet Take 100 mg by mouth daily as needed (erectile dysfunction).    Marland Kitchen  traZODone (DESYREL) 50 MG tablet Take 50 mg by mouth at bedtime.    . vitamin B-12 (CYANOCOBALAMIN) 1000 MCG tablet Take 1,000 mcg by mouth in the morning and at bedtime.     . [employer] SENNA 8.6 MG tablet Take 1 tablet by mouth 2 (two) times daily as needed for constipation.  (Patient not taking: No sig reported)  5  . gabapentin (NEURONTIN) 300 MG capsule Take 600 mg by mouth at bedtime.  (Patient not taking: No sig reported)    . Multiple Vitamin (MULTIVITAMIN WITH MINERALS)  TABS tablet Take 1 tablet by mouth daily. (Patient not taking: No sig reported)     No current facility-administered medications on file prior to visit.    There are no Patient Instructions on file for this visit. No follow-ups on file.   Kris Hartmann, NP

## 2020-12-21 ENCOUNTER — Other Ambulatory Visit: Payer: Self-pay

## 2020-12-21 ENCOUNTER — Encounter: Payer: Self-pay | Admitting: Emergency Medicine

## 2020-12-21 ENCOUNTER — Emergency Department
Admission: EM | Admit: 2020-12-21 | Discharge: 2020-12-21 | Disposition: A | Discharge status: Home / Self Care | Payer: Medicaid Other | Attending: Emergency Medicine | Admitting: Emergency Medicine

## 2020-12-21 DIAGNOSIS — T879 Unspecified complications of amputation stump: Secondary | ICD-10-CM | POA: Insufficient documentation

## 2020-12-21 DIAGNOSIS — X58XXXA Exposure to other specified factors, initial encounter: Secondary | ICD-10-CM | POA: Diagnosis not present

## 2020-12-21 DIAGNOSIS — Z7984 Long term (current) use of oral hypoglycemic drugs: Secondary | ICD-10-CM | POA: Diagnosis not present

## 2020-12-21 DIAGNOSIS — I10 Essential (primary) hypertension: Secondary | ICD-10-CM | POA: Diagnosis not present

## 2020-12-21 DIAGNOSIS — Z79899 Other long term (current) drug therapy: Secondary | ICD-10-CM | POA: Insufficient documentation

## 2020-12-21 DIAGNOSIS — E1142 Type 2 diabetes mellitus with diabetic polyneuropathy: Secondary | ICD-10-CM | POA: Insufficient documentation

## 2020-12-21 DIAGNOSIS — Z87891 Personal history of nicotine dependence: Secondary | ICD-10-CM | POA: Insufficient documentation

## 2020-12-21 DIAGNOSIS — W19XXXA Unspecified fall, initial encounter: Secondary | ICD-10-CM | POA: Diagnosis not present

## 2020-12-21 LAB — CBC WITH DIFFERENTIAL/PLATELET
Abs Immature Granulocytes: 0.03 10*3/uL (ref 0.00–0.07)
Basophils Absolute: 0.1 10*3/uL (ref 0.0–0.1)
Basophils Relative: 1 %
Eosinophils Absolute: 0.2 10*3/uL (ref 0.0–0.5)
Eosinophils Relative: 2 %
HCT: 33.8 % — ABNORMAL LOW (ref 39.0–52.0)
Hemoglobin: 10.6 g/dL — ABNORMAL LOW (ref 13.0–17.0)
Immature Granulocytes: 0 %
Lymphocytes Relative: 24 %
Lymphs Abs: 1.9 10*3/uL (ref 0.7–4.0)
MCH: 25.7 pg — ABNORMAL LOW (ref 26.0–34.0)
MCHC: 31.4 g/dL (ref 30.0–36.0)
MCV: 82 fL (ref 80.0–100.0)
Monocytes Absolute: 0.4 10*3/uL (ref 0.1–1.0)
Monocytes Relative: 5 %
Neutro Abs: 5.6 10*3/uL (ref 1.7–7.7)
Neutrophils Relative %: 68 %
Platelets: 269 10*3/uL (ref 150–400)
RBC: 4.12 MIL/uL — ABNORMAL LOW (ref 4.22–5.81)
RDW: 15.4 % (ref 11.5–15.5)
WBC: 8.2 10*3/uL (ref 4.0–10.5)
nRBC: 0 % (ref 0.0–0.2)

## 2020-12-21 LAB — COMPREHENSIVE METABOLIC PANEL
ALT: 13 U/L (ref 0–44)
AST: 20 U/L (ref 15–41)
Albumin: 3.6 g/dL (ref 3.5–5.0)
Alkaline Phosphatase: 113 U/L (ref 38–126)
Anion gap: 11 (ref 5–15)
BUN: 26 mg/dL — ABNORMAL HIGH (ref 6–20)
CO2: 21 mmol/L — ABNORMAL LOW (ref 22–32)
Calcium: 9.3 mg/dL (ref 8.9–10.3)
Chloride: 106 mmol/L (ref 98–111)
Creatinine, Ser: 1.31 mg/dL — ABNORMAL HIGH (ref 0.61–1.24)
GFR, Estimated: >60 mL/min (ref >60)
Glucose, Bld: 261 mg/dL — ABNORMAL HIGH (ref 70–99)
Potassium: 3.7 mmol/L (ref 3.5–5.1)
Sodium: 138 mmol/L (ref 135–145)
Total Bilirubin: 0.5 mg/dL (ref 0.3–1.2)
Total Protein: 7.7 g/dL (ref 6.5–8.1)

## 2020-12-21 LAB — PROTIME-INR
INR: 0.9 (ref 0.8–1.2)
Prothrombin Time: 12.2 seconds (ref 11.4–15.2)

## 2020-12-21 MED ORDER — LIDOCAINE-EPINEPHRINE 2 %-1:100000 IJ SOLN
20.0000 mL | Freq: Once | INTRAMUSCULAR | Status: AC
  Administered 2020-12-21: 20 mL
  Filled 2020-12-21: qty 1

## 2020-12-21 NOTE — ED Notes (Signed)
VORB from Dr. Lenard Lance regarding blood work.

## 2020-12-21 NOTE — Discharge Instructions (Addendum)
As we discussed, please leave the bulky dressing on for at least 2 days to allow for constant pressure. You may see some blood, and that is okay. It would be more concerning if you have saturation and dripping of blood from this bandage.  Follow-up with Dr. Wyn Quaker in the clinic as scheduled.  Return to the ED with any fevers or uncontrolled bleeding.

## 2020-12-21 NOTE — ED Triage Notes (Signed)
Pt to ED via POV with c/o fall earlier today, pt reports had amputation approx 1 month ago and had L BKA, pt states fell directly onto stump earlier today and is now having uncontrollable bleeding. Pt with noted wrap in place on arrival to ED and no bleeding noted at this time, pt states has bled through gauze. Pt also c/o high blood pressure at this time. Pt states called Dr. Wyn Quaker and was referred to ED, states prior to wrapping his stump he noted small circular hole that was bleeding.

## 2020-12-21 NOTE — ED Provider Notes (Signed)
[facility] Emergency Department Provider Note ____________________________________________   Event Date/Time   First MD Initiated Contact with Patient 12/21/20 1830     (approximate)  I have reviewed the triage vital signs and the nursing notes.  HISTORY  Chief Complaint Fall (/) and Leg Pain   HPI Richard Mendoza is a 45 y.o. malewho presents to the ED for evaluation of leg/stump pain after a fall.   Chart review indicates hx HTN, DM, HLD.  12/29 left-sided BKA with Dr. Lucky Cowboy.  Staples removed on follow-up visit on 1/21 and Steri-Strips applied.  Patient reports mechanical fall 2 days ago where he landed directly on his BKA stump and has had bleeding from his suture line since that time. Patient reports a slow and consistent bleeding that he has not been able to stop despite reapplying bandages multiple times in the past 2 days. Denies any brisk or arterial bleeding, denies dehiscence of his wound, denies fevers or spreading red rash.  Past Medical History:  Diagnosis Date  . DIABETES MELLITUS, TYPE II, UNCONTROLLED 03/17/2009  . DM 12/08/2008  . HYPERLIPIDEMIA 03/17/2009  . HYPERTENSION 12/08/2008  . 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 Abnormal weight loss 12/09/2020  . Change in bowel habit 12/09/2020  . Constipation 12/09/2020  . Epigastric pain 12/09/2020  . Drug-induced constipation 12/09/2020  . Periumbilical pain 99/35/7017  . Fatty liver 12/09/2020  . S/P BKA (below knee amputation), left ([place]) 11/20/2020  . Necrotizing fasciitis of ankle and foot ([place]) 11/07/2020  . History of 2019 novel coronavirus disease (COVID-19) 09/01/2020  . Major depressive disorder, recurrent, in remission ([place]) 09/01/2020  . Pain management contract signed 09/07/2019  . Evaluation by psychiatric service required 08/18/2019  . History of depression 08/18/2019  . Primary osteoarthritis of both shoulders 07/16/2019  .  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 Chronic pain of both shoulders 07/16/2019  . Cervical radicular pain 07/16/2019  . S/P cervical spinal fusion 07/16/2019  . Cervical spondylosis 07/16/2019  . Cervical facet joint syndrome 07/16/2019  . Chronic pain syndrome 07/16/2019  . Anemia 05/25/2019  . Osteomyelitis of right foot ([place]) 05/25/2019  . Diabetic foot infection ([place]) 03/05/2019  . Abscess 11/25/2018  . Diabetic peripheral neuropathy associated with type 2 diabetes mellitus ([place]) 06/16/2018  . Hyperlipidemia associated with type 2 diabetes mellitus ([place]) 06/16/2018  . Intractable nausea and vomiting 10/21/2017  . HNP (herniated nucleus pulposus), cervical 09/09/2017  . Erectile dysfunction 07/31/2017  . Gastroesophageal reflux disease 07/31/2017  . Rotator cuff syndrome of left shoulder 07/31/2017  . Cellulitis of right leg 07/30/2016  . Tobacco abuse disorder 07/30/2016  . Routine general medical examination at a health care facility 01/20/2014  . Diabetic neuropathy, type I diabetes mellitus ([place]) 01/20/2014  . 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 HYPERLIPIDEMIA 03/17/2009  . DM type 2 with diabetic peripheral neuropathy ([place]) 03/02/2009  . DM 12/08/2008  . OBSTRUCTIVE SLEEP APNEA 12/08/2008  . Essential hypertension 12/08/2008  . Allergic rhinitis 12/15/2007  . Other malaise and fatigue 12/15/2007    Past Surgical History:  Procedure Laterality Date  . AMPUTATION Left 11/07/2020   Procedure: 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;  Surgeon: Sharlotte Alamo, DPM;  Location: [facility];  Service: Podiatry;  Laterality: Left;  . AMPUTATION Left 11/16/2020   Procedure: AMPUTATION BELOW KNEE;  Surgeon: Algernon Huxley, MD;  Location: [facility];  Service: Vascular;  Laterality:  Left;  . ANTERIOR CERVICAL DECOMP/DISCECTOMY FUSION N/A 09/09/2017   Procedure: ANTERIOR CERVICAL DECOMPRESSION/DISCECTOMY FUSION CERVICAL 6- CERVICAL 7;  Surgeon: Ashok Pall,  MD;  Location: [place];  Service: Neurosurgery;  Laterality: N/A;  ANTERIOR CERVICAL DECOMPRESSION/DISCECTOMY FUSION CERVICAL 6- CERVICAL 7  . APPENDECTOMY    . I & D EXTREMITY Right 10/03/2017   Procedure: IRRIGATION AND DEBRIDEMENT RIGHT WRIST;  Surgeon: Leanora Cover, MD;  Location: [place];  Service: Orthopedics;  Laterality: Right;  . I & D EXTREMITY Right 11/26/2018   Procedure: IRRIGATION AND DEBRIDEMENT FASCIA ON RIGHT FOOT;  Surgeon: Samara Deist, DPM;  Location: [facility];  Service: Podiatry;  Laterality: Right;  . 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 INCISION AND DRAINAGE Left 11/07/2020   Procedure: INCISION AND DRAINAGE;  Surgeon: Sharlotte Alamo, DPM;  Location: [facility];  Service: Podiatry;  Laterality: Left;  . IRRIGATION AND DEBRIDEMENT FOOT Left 11/11/2020   Procedure: IRRIGATION AND DEBRIDEMENT FOOT;  Surgeon: Sharlotte Alamo, DPM;  Location: [facility];  Service: Podiatry;  Laterality: Left;  . METATARSAL HEAD EXCISION Right 05/15/2019   Procedure: OSTECTOMY;MET HEAD 5;  Surgeon: Samara Deist, DPM;  Location: [facility];  Service: Podiatry;  Laterality: Right;  . osteomylitis    . ROTATOR CUFF REPAIR Left     Prior to Admission medications   Medication Sig Start Date End Date Taking? Authorizing Provider  atorvastatin (LIPITOR) 40 MG tablet Take 40 mg by mouth daily.  08/23/17   [provider]  busPIRone (BUSPAR) 15 MG tablet Take 15 mg by mouth 2 (two) times daily.    [provider]  [employer] SENNA 8.6 MG tablet Take 1 tablet by mouth 2 (two) times daily as needed for constipation.  Patient not taking: No sig reported 10/31/17   [provider]  dicyclomine (BENTYL) 10 MG capsule Take 10 mg by mouth in the morning and at bedtime. 04/27/20   [provider]  famotidine (PEPCID) 20 MG tablet  Take 20 mg by mouth daily.    [provider]  fenofibrate (TRICOR) 48 MG tablet Take 48 mg by mouth daily. 09/07/20   [provider]  ferrous sulfate 325 (65 FE) MG tablet Take 325 mg by mouth 2 (two) times daily with a meal.    [provider]  FIASP 100 UNIT/ML SOLN Inject into the skin continuous. Via pump 10/09/20   [provider]  furosemide (LASIX) 40 MG tablet Take 40 mg by mouth daily.    [provider]  gabapentin (NEURONTIN) 300 MG capsule Take 600 mg by mouth at bedtime.  Patient not taking: No sig reported    [provider]  linaclotide (LINZESS) 72 MCG capsule Take 72 mcg by mouth daily as needed (constipation).    [provider]  lisinopril (ZESTRIL) 20 MG tablet Take 1 tablet (20 mg total) by mouth daily. 11/21/20 12/21/20  Antonieta Pert, MD  MAGNESIUM PO Take 1 tablet by mouth daily.    [provider]  metoCLOPramide (REGLAN) 10 MG tablet Take 10 mg by mouth 3 (three) times daily as needed for nausea or vomiting. 05/25/19   [provider]  Multiple Vitamin (MULTIVITAMIN WITH MINERALS) TABS tablet Take 1 tablet by mouth daily. Patient not taking: No sig reported    [provider]  ondansetron (ZOFRAN) 8 MG tablet  Take 8 mg by mouth every 6 (six) hours as needed for nausea/vomiting.    [provider]  oxyCODONE (OXY IR/ROXICODONE) 5 MG immediate release tablet Take 1 tablet (5 mg total) by mouth every 4 (four) hours as needed for moderate pain or severe pain. 11/21/20   Stegmayer, Joelene Millin A, PA-C  pantoprazole (PROTONIX) 40 MG tablet Take 40 mg by mouth daily. 08/23/17   [provider]  pioglitazone (ACTOS) 30 MG tablet Take 30 mg by mouth daily.  06/29/19   [provider]  POTASSIUM PO Take 1 capsule by mouth in the morning and at bedtime.    [provider]  sertraline (ZOLOFT) 100 MG tablet Take 100 mg by mouth daily.  10/31/17   [provider]   sildenafil (REVATIO) 20 MG tablet Take 100 mg by mouth daily as needed (erectile dysfunction).    [provider]  traZODone (DESYREL) 50 MG tablet Take 50 mg by mouth at bedtime.    [provider]  vitamin B-12 (CYANOCOBALAMIN) 1000 MCG tablet Take 1,000 mcg by mouth in the morning and at bedtime.  01/27/19   [provider]    Allergies Patient has no known allergies.  Family History  Problem Relation Age of Onset  . Diabetes Mother   . Heart disease Father   . Diabetes Father   . Arthritis Other   . Hyperlipidemia Other   . Hypertension Other   . Cancer Other        breast  . Mental illness Neg Hx     Social History Social History   Tobacco Use  . Smoking status: Former Smoker    Packs/day: 1.00    Years: 17.00    Pack years: 17.00    Types: Cigarettes    Quit date: 01/14/2019    Years since quitting: 1.9  . Smokeless tobacco: Never Used  Vaping Use  . Vaping Use: Never used  Substance Use Topics  . Alcohol use: Yes    Comment: rare  . Drug use: No    Review of Systems  Constitutional: No fever/chills Eyes: No visual changes. ENT: No sore throat. Cardiovascular: Denies chest pain. Respiratory: Denies shortness of breath. Gastrointestinal: No abdominal pain.  No nausea, no vomiting.  No diarrhea.  No constipation. Genitourinary: Negative for dysuria. Musculoskeletal: Negative for back pain. Positive for bleeding from BKA stump site. Skin: Negative for rash. Neurological: Negative for headaches, focal weakness or numbness.  ____________________________________________   PHYSICAL EXAM:  VITAL SIGNS: Vitals:   12/21/20 1748 12/21/20 1934  BP: (!) 173/101 (!) 183/99  Pulse: 100 94  Resp: 18 18  Temp: 98 F (36.7 C)   SpO2: 100% 99%     Constitutional: Alert and oriented. Well appearing and in no acute distress. Eyes: Conjunctivae are normal. PERRL. EOMI. Head: Atraumatic. Nose: No congestion/rhinnorhea. Mouth/Throat:  Mucous membranes are moist.  Oropharynx non-erythematous. Neck: No stridor. No cervical spine tenderness to palpation. Cardiovascular: Normal rate, regular rhythm. Grossly normal heart sounds.  Good peripheral circulation. Respiratory: Normal respiratory effort.  No retractions. Lungs CTAB. Gastrointestinal: Soft , nondistended, nontender to palpation. No CVA tenderness. Musculoskeletal: No lower extremity tenderness nor edema.  No joint effusions.  Home bulky dressing removed with no bleeding to outside Ace wrap, but with incomplete blood staining to the 4 x 4's beneath this. Small amount of oozing dark blood at the suture line, as pictured below. No significant dehiscence of the wound. I hold direct pressure at the site for  about 5 minutes as I talked to the patient, and still has similar slow bleeding at the end of this timeframe. Neurologic:  Normal speech and language. No gross focal neurologic deficits are appreciated. No gait instability noted. Skin:  Skin is warm, dry and intact. No rash noted. Psychiatric: Mood and affect are normal. Speech and behavior are normal.     ____________________________________________   LABS (all labs ordered are listed, but only abnormal results are displayed)  Labs Reviewed  CBC WITH DIFFERENTIAL/PLATELET - Abnormal; Notable for the following components:      Result Value   RBC 4.12 (*)    Hemoglobin 10.6 (*)    HCT 33.8 (*)    MCH 25.7 (*)    All other components within normal limits  COMPREHENSIVE METABOLIC PANEL - Abnormal; Notable for the following components:   CO2 21 (*)    Glucose, Bld 261 (*)    BUN 26 (*)    Creatinine, Ser 1.31 (*)    All other components within normal limits  PROTIME-INR   ____________________________________________   PROCEDURES and INTERVENTIONS  Procedure(s) performed (including Critical Care):  Marland KitchenMarland KitchenLaceration Repair  Date/Time: 12/21/2020 8:12 PM Performed by: Vladimir Crofts, MD Authorized by: Vladimir Crofts, MD   Consent:    Consent obtained:  Verbal   Consent given by:  Patient and spouse   Risks, benefits, and alternatives were discussed: yes     Risks discussed:  Pain, need for additional repair and infection   Alternatives discussed:  No treatment Anesthesia:    Anesthesia method:  Local infiltration   Local anesthetic:  Lidocaine 1% WITH epi Laceration details:    Location: left BKA stump suture line.   Length (cm):  0.5 Exploration:    Contaminated: no   Treatment:    Wound cleansed with: alcohol.   Amount of cleaning:  Standard   Visualized foreign bodies/material removed: no     Debridement:  None   Undermining:  None   Scar revision: no   Skin repair:    Repair method:  Sutures   Suture size:  3-0   Suture material:  Chromic gut   Suture technique:  Simple interrupted   Number of sutures:  2 Approximation:    Approximation:  Close Repair type:    Repair type:  Simple Post-procedure details:    Dressing:  Bulky dressing (Xeroform, gauze then Ace wrap)   Procedure completion:  Tolerated well, no immediate complications Comments:     After cleaning the skin, infiltration of 1% lidocaine with epinephrine, I placed two simple interrupted sutures across his surgical site/staple line at the site of his slow oozing. Subsequent hemostasis.      Medications  lidocaine-EPINEPHrine (XYLOCAINE W/EPI) 2 %-1:100000 (with pres) injection 20 mL (20 mLs Infiltration Given by Other 12/21/20 1905)    ____________________________________________   MDM / ED COURSE   45 year old male presents to the ED with slow venous bleeding from his BKA stump suture line after mechanical fall, amenable to bedside suture repair and outpatient management. Slight hypertension, otherwise normal vitals on room air. Exam is generally reassuring without evidence of distress, neurovascular deficits or significant risk of bleeding. No wound dehiscence or surgical site infection noted. As pictured  above, has some slow venous bleeding. I discussed the case with his surgeon, Dr. Lucky Cowboy, and place a single suture and lidocaine with epinephrine to the site, apply bulky dressing and will have the patient follow-up as an outpatient. We discussed return precautions for the ED  prior to discharge.   Clinical Course as of 12/21/20 2012  Wed Dec 21, 2020  1852 Spoke with Dr. Lucky Cowboy , he recommends single suture and bulky dressing with Xeroform, gauze and Ace wrap. He recommends maintaining a tight bulky bandage on the wound for at least 2 days.  [DS]    Clinical Course User Index [DS] Vladimir Crofts, MD    ____________________________________________   FINAL CLINICAL IMPRESSION(S) / ED DIAGNOSES  Final diagnoses:  Fall, initial encounter  BKA stump complication [facility])     ED Discharge Orders    None       Tilia Faso Tamala Julian   Note:  This document was prepared using Dragon voice recognition software and may include unintentional dictation errors.   Vladimir Crofts, MD 12/21/20 2014

## 2020-12-30 ENCOUNTER — Telehealth (INDEPENDENT_AMBULATORY_CARE_PROVIDER_SITE_OTHER): Payer: Self-pay | Admitting: Nurse Practitioner

## 2020-12-30 NOTE — Telephone Encounter (Signed)
I spoke with [employer] an they stated that the patient received the wheelchair on 12/22/20 and was exchange for new wheelchair due to damage. The patient wife stated that she made the driver aware that the order was for transport wheelchair but the person who drop the wheelchair off stated that a order was needed for the patient to receive the transport chair. I spoke back with [employer] and they informed that they had an order but not for the transport chair. I made [employer] and patient wife aware that a new order will be faxed over to [phone] for transport chair.

## 2020-12-30 NOTE — Telephone Encounter (Signed)
Wife called to check the status of tranportchair requested through [employer]. Patient was last seen 12/09/20 post op visit post amputation below the knee. Please advise.

## 2021-01-02 DIAGNOSIS — E1065 Type 1 diabetes mellitus with hyperglycemia: Secondary | ICD-10-CM | POA: Diagnosis not present

## 2021-01-20 ENCOUNTER — Ambulatory Visit (INDEPENDENT_AMBULATORY_CARE_PROVIDER_SITE_OTHER): Payer: Medicaid Other | Admitting: Nurse Practitioner

## 2021-01-20 ENCOUNTER — Other Ambulatory Visit: Payer: Self-pay

## 2021-01-20 ENCOUNTER — Encounter (INDEPENDENT_AMBULATORY_CARE_PROVIDER_SITE_OTHER): Payer: Self-pay | Admitting: Nurse Practitioner

## 2021-01-20 VITALS — BP 145/92 | HR 87 | Resp 16

## 2021-01-20 DIAGNOSIS — E1169 Type 2 diabetes mellitus with other specified complication: Secondary | ICD-10-CM

## 2021-01-20 DIAGNOSIS — E785 Hyperlipidemia, unspecified: Secondary | ICD-10-CM

## 2021-01-20 DIAGNOSIS — Z89512 Acquired absence of left leg below knee: Secondary | ICD-10-CM

## 2021-01-20 MED ORDER — GABAPENTIN 300 MG PO CAPS: ORAL_CAPSULE | ORAL | 3 refills | Status: DC

## 2021-01-30 ENCOUNTER — Encounter (INDEPENDENT_AMBULATORY_CARE_PROVIDER_SITE_OTHER): Payer: Self-pay | Admitting: Nurse Practitioner

## 2021-01-30 DIAGNOSIS — E1065 Type 1 diabetes mellitus with hyperglycemia: Secondary | ICD-10-CM | POA: Diagnosis not present

## 2021-01-30 NOTE — Progress Notes (Signed)
Subjective:    Patient ID: Richard Mendoza, male    DOB: 1976-06-10, 45 y.o.   MRN: [id number] Chief Complaint  Patient presents with  . Wound Check    6 wk follow up     The patient presents today following left below-knee amputation on 11/16/2020.  He was admitted to [facility] 12/09/2019 with a left diabetic foot abscess.  He subsequently underwent extensive debridement any drainage of his left foot however the damage was found to be quite extensive and ultimately the foot was not salvageable.  Today, the patient has been working with physical therapy and is able to keep his knee extremely straight.  He is continuing to do very well in that regard.  The incision looks mostly healed except for one small area that had been continuing to drain.  The patient notes that this drainage recently stopped.  The patient has also been utilizing a shrinker sock and there is evidence of good remodeling so far.  No signs or symptoms of infection seen.      Review of Systems  Skin: Positive for wound.  Neurological: Positive for weakness.  All other systems reviewed and are negative.      Objective:   Physical Exam Vitals reviewed.  HENT:     Head: Normocephalic.  Cardiovascular:     Rate and Rhythm: Normal rate.  Pulmonary:     Effort: Pulmonary effort is normal.  Musculoskeletal:     Right Lower Extremity: Right leg is amputated below knee.  Neurological:     Mental Status: He is alert and oriented to person, place, and time.     Motor: Weakness present.     Gait: Gait abnormal.  Psychiatric:        Mood and Affect: Mood normal.        Behavior: Behavior normal.        Thought Content: Thought content normal.        Judgment: Judgment normal.     BP (!) 145/92 (BP Location: Left Arm)   Pulse 87   Resp 16   Past Medical History:  Diagnosis Date  . DIABETES MELLITUS, TYPE II, UNCONTROLLED 03/17/2009  . DM 12/08/2008  . HYPERLIPIDEMIA 03/17/2009  .  HYPERTENSION 12/08/2008  . 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 Marital status: Married    Spouse name: Not on file  . Number of children: 4  . Years of education: Not on file  . Highest education level: Not on file  Occupational History  . Not on file  Tobacco Use  . Smoking status: Former Smoker    Packs/day: 1.00    Years: 17.00    Pack years: 17.00    Types: Cigarettes    Quit date: 01/14/2019    Years since quitting: 2.0  . Smokeless tobacco: Never Used  Vaping Use  . Vaping Use: Never used  Substance and Sexual Activity  . Alcohol use: Yes    Comment: rare  . Drug use: No  . Sexual activity: Yes  Other Topics Concern  . Not on file  Social History Narrative  . Not on file   Social Determinants of Health   Financial Resource Strain: Not on file  Food Insecurity: Not on file  Transportation Needs: Not on file  Physical Activity: Not on file  Stress: Not on file  Social Connections: Not on file  Intimate Partner Violence: Not on file    Past  Surgical History:  Procedure Laterality Date  . AMPUTATION Left 11/07/2020   Procedure: 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;  Surgeon: Sharlotte Alamo, DPM;  Location: [facility];  Service: Podiatry;  Laterality: Left;  . AMPUTATION Left 11/16/2020   Procedure: AMPUTATION BELOW KNEE;  Surgeon: Algernon Huxley, MD;  Location: [facility];  Service: Vascular;  Laterality: Left;  . ANTERIOR CERVICAL DECOMP/DISCECTOMY FUSION N/A 09/09/2017   Procedure: ANTERIOR CERVICAL DECOMPRESSION/DISCECTOMY FUSION CERVICAL 6- CERVICAL 7;  Surgeon: Ashok Pall, MD;  Location: [place];  Service: Neurosurgery;  Laterality: N/A;  ANTERIOR CERVICAL DECOMPRESSION/DISCECTOMY FUSION CERVICAL 6- CERVICAL 7  . APPENDECTOMY    . I & D EXTREMITY Right 10/03/2017   Procedure: IRRIGATION AND DEBRIDEMENT RIGHT WRIST;  Surgeon: Leanora Cover, MD;  Location: [place];  Service: Orthopedics;  Laterality: Right;  . I & D  EXTREMITY Right 11/26/2018   Procedure: IRRIGATION AND DEBRIDEMENT FASCIA ON RIGHT FOOT;  Surgeon: Samara Deist, DPM;  Location: [facility];  Service: Podiatry;  Laterality: Right;  . 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 INCISION AND DRAINAGE Left 11/07/2020   Procedure: INCISION AND DRAINAGE;  Surgeon: Sharlotte Alamo, DPM;  Location: [facility];  Service: Podiatry;  Laterality: Left;  . IRRIGATION AND DEBRIDEMENT FOOT Left 11/11/2020   Procedure: IRRIGATION AND DEBRIDEMENT FOOT;  Surgeon: Sharlotte Alamo, DPM;  Location: [facility];  Service: Podiatry;  Laterality: Left;  . METATARSAL HEAD EXCISION Right 05/15/2019   Procedure: OSTECTOMY;MET HEAD 5;  Surgeon: Samara Deist, DPM;  Location: [facility];  Service: Podiatry;  Laterality: Right;  . osteomylitis    . ROTATOR CUFF REPAIR Left     Family History  Problem Relation Age of Onset  . Diabetes Mother   . Heart disease Father   . Diabetes Father   . Arthritis Other   . Hyperlipidemia Other   . Hypertension Other   . Cancer Other        breast  . Mental illness Neg Hx     No Known Allergies  CBC Latest Ref Rng & Units 12/21/2020 11/21/2020 11/21/2020  WBC 4.0 - 10.5 K/uL 8.2 - 10.8(H)  Hemoglobin 13.0 - 17.0 g/dL 10.6(L) 8.0(L) 7.1(L)  Hematocrit 39.0 - 52.0 % 33.8(L) 26.1(L) 23.9(L)  Platelets 150 - 400 K/uL 269 - 460(H)      CMP     Component Value Date/Time   NA 138 12/21/2020 1809   K 3.7 12/21/2020 1809   CL 106 12/21/2020 1809   CO2 21 (L) 12/21/2020 1809   GLUCOSE 261 (H) 12/21/2020 1809   BUN 26 (H) 12/21/2020 1809   CREATININE 1.31 (H) 12/21/2020 1809   CALCIUM 9.3 12/21/2020 1809   PROT 7.7 12/21/2020 1809   ALBUMIN 3.6 12/21/2020 1809   AST 20 12/21/2020 1809   ALT 13 12/21/2020 1809   ALKPHOS 113 12/21/2020 1809   BILITOT 0.5 12/21/2020 1809   GFRNONAA  >60 12/21/2020 1809   GFRAA >60 05/05/2020 1930     No results found.     Assessment & Plan:   1. S/P BKA (below knee amputation), left (HCC) We will increase the patient's gabapentin to help with his phantom limb pain.  Otherwise the patient is doing very well and is very motivated to begin using his prosthesis.  The patient will continue to try to keep his  knee straight in preparation for his prosthesis.  We will have the patient return in 3 months for ABIs to ensure no pending vascular issues on his right lower extremity. - gabapentin (NEURONTIN) 300 MG capsule; Take 1 capsule (300 mg total) by mouth 2 (two) times daily with breakfast and lunch AND 2 capsules (600 mg total) at bedtime.  Dispense: 120 capsule; Refill: 3  2. Hyperlipidemia associated with type 2 diabetes mellitus ([place]) Continue statin as ordered and reviewed, no changes at this time    Current Outpatient Medications on File Prior to Visit  Medication Sig Dispense Refill  . atorvastatin (LIPITOR) 40 MG tablet Take 40 mg by mouth daily.   3  . busPIRone (BUSPAR) 15 MG tablet Take 15 mg by mouth 2 (two) times daily.    Marland Kitchen dicyclomine (BENTYL) 10 MG capsule Take 10 mg by mouth in the morning and at bedtime.    . famotidine (PEPCID) 20 MG tablet Take 20 mg by mouth daily.    . fenofibrate (TRICOR) 48 MG tablet Take 48 mg by mouth daily.    . ferrous sulfate 325 (65 FE) MG tablet Take 325 mg by mouth 2 (two) times daily with a meal.    . FIASP 100 UNIT/ML SOLN Inject into the skin continuous. Via pump    . furosemide (LASIX) 40 MG tablet Take 40 mg by mouth daily.    Marland Kitchen linaclotide (LINZESS) 72 MCG capsule Take 72 mcg by mouth daily as needed (constipation).    Marland Kitchen MAGNESIUM PO Take 1 tablet by mouth daily.    . metoCLOPramide (REGLAN) 10 MG tablet Take 10 mg by mouth 3 (three) times daily as needed for nausea or vomiting.    . ondansetron (ZOFRAN) 8 MG tablet Take 8 mg by mouth every 6 (six) hours as needed for  nausea/vomiting.    Marland Kitchen oxyCODONE (OXY IR/ROXICODONE) 5 MG immediate release tablet Take 1 tablet (5 mg total) by mouth every 4 (four) hours as needed for moderate pain or severe pain. 28 tablet 0  . pantoprazole (PROTONIX) 40 MG tablet Take 40 mg by mouth daily.  5  . pioglitazone (ACTOS) 30 MG tablet Take 30 mg by mouth daily.     Marland Kitchen POTASSIUM PO Take 1 capsule by mouth in the morning and at bedtime.    . sertraline (ZOLOFT) 100 MG tablet Take 100 mg by mouth daily.   5  . sildenafil (REVATIO) 20 MG tablet Take 100 mg by mouth daily as needed (erectile dysfunction).    . traZODone (DESYREL) 50 MG tablet Take 50 mg by mouth at bedtime.    . vitamin B-12 (CYANOCOBALAMIN) 1000 MCG tablet Take 1,000 mcg by mouth in the morning and at bedtime.     . [employer] SENNA 8.6 MG tablet Take 1 tablet by mouth 2 (two) times daily as needed for constipation.  (Patient not taking: No sig reported)  5  . lisinopril (ZESTRIL) 20 MG tablet Take 1 tablet (20 mg total) by mouth daily. 30 tablet 0  . Multiple Vitamin (MULTIVITAMIN WITH MINERALS) TABS tablet Take 1 tablet by mouth daily. (Patient not taking: No sig reported)     No current facility-administered medications on file prior to visit.    There are no Patient Instructions on file for this visit. No follow-ups on file.   Kris Hartmann, NP

## 2021-03-02 ENCOUNTER — Telehealth (INDEPENDENT_AMBULATORY_CARE_PROVIDER_SITE_OTHER): Payer: Self-pay | Admitting: Vascular Surgery

## 2021-03-02 NOTE — Telephone Encounter (Signed)
Wife called stating that patietn needs RX for rehab. Patient received his prosthetic

## 2021-03-02 NOTE — Telephone Encounter (Signed)
Wife called to inform us that patient received his prosthetic 02/28/21 and will need a RX for rehab. Patient was last seen 01/20/21 wound check with fb.

## 2021-03-02 NOTE — Telephone Encounter (Signed)
I called the pts wife back and the VM was full. I need to know if the pt wants home heath to come to his house for rehab or if he wants out patient Rehab.

## 2021-03-13 ENCOUNTER — Telehealth (INDEPENDENT_AMBULATORY_CARE_PROVIDER_SITE_OTHER): Payer: Self-pay

## 2021-03-13 NOTE — Telephone Encounter (Signed)
Dorothea Glassman from [facility] physical therapy left a voicemail stating that the patient informed her that he has been driving and is not considered home bound for home health for insurance protocol. The patient stated that he can go to out patient therapy with his wife and son help.Patient wife was made aware and informed that the referral can be sent to [facility].

## 2021-03-14 ENCOUNTER — Other Ambulatory Visit (INDEPENDENT_AMBULATORY_CARE_PROVIDER_SITE_OTHER): Payer: Self-pay | Admitting: Nurse Practitioner

## 2021-03-14 DIAGNOSIS — Z89512 Acquired absence of left leg below knee: Secondary | ICD-10-CM

## 2021-03-14 NOTE — Telephone Encounter (Signed)
sent 

## 2021-04-03 ENCOUNTER — Emergency Department (HOSPITAL_COMMUNITY): Payer: Medicaid Other

## 2021-04-03 ENCOUNTER — Inpatient Hospital Stay (HOSPITAL_COMMUNITY)
Admission: EM | Admit: 2021-04-03 | Discharge: 2021-04-06 | DRG: 684 | Disposition: A | Discharge status: Home / Self Care | Payer: Medicaid Other | Attending: Internal Medicine | Admitting: Internal Medicine

## 2021-04-03 ENCOUNTER — Other Ambulatory Visit: Payer: Self-pay

## 2021-04-03 ENCOUNTER — Encounter (HOSPITAL_COMMUNITY): Payer: Self-pay | Admitting: Family Medicine

## 2021-04-03 DIAGNOSIS — Z89512 Acquired absence of left leg below knee: Secondary | ICD-10-CM | POA: Diagnosis not present

## 2021-04-03 DIAGNOSIS — M542 Cervicalgia: Secondary | ICD-10-CM | POA: Diagnosis not present

## 2021-04-03 DIAGNOSIS — E669 Obesity, unspecified: Secondary | ICD-10-CM | POA: Diagnosis present

## 2021-04-03 DIAGNOSIS — Z833 Family history of diabetes mellitus: Secondary | ICD-10-CM | POA: Diagnosis not present

## 2021-04-03 DIAGNOSIS — Z9641 Presence of insulin pump (external) (internal): Secondary | ICD-10-CM | POA: Diagnosis present

## 2021-04-03 DIAGNOSIS — G894 Chronic pain syndrome: Secondary | ICD-10-CM | POA: Diagnosis present

## 2021-04-03 DIAGNOSIS — I951 Orthostatic hypotension: Secondary | ICD-10-CM | POA: Diagnosis present

## 2021-04-03 DIAGNOSIS — E1165 Type 2 diabetes mellitus with hyperglycemia: Secondary | ICD-10-CM | POA: Diagnosis not present

## 2021-04-03 DIAGNOSIS — N19 Unspecified kidney failure: Secondary | ICD-10-CM

## 2021-04-03 DIAGNOSIS — Z8249 Family history of ischemic heart disease and other diseases of the circulatory system: Secondary | ICD-10-CM

## 2021-04-03 DIAGNOSIS — E1151 Type 2 diabetes mellitus with diabetic peripheral angiopathy without gangrene: Secondary | ICD-10-CM | POA: Diagnosis present

## 2021-04-03 DIAGNOSIS — Z794 Long term (current) use of insulin: Secondary | ICD-10-CM

## 2021-04-03 DIAGNOSIS — N182 Chronic kidney disease, stage 2 (mild): Secondary | ICD-10-CM | POA: Diagnosis not present

## 2021-04-03 DIAGNOSIS — Z87891 Personal history of nicotine dependence: Secondary | ICD-10-CM | POA: Diagnosis not present

## 2021-04-03 DIAGNOSIS — E785 Hyperlipidemia, unspecified: Secondary | ICD-10-CM | POA: Diagnosis present

## 2021-04-03 DIAGNOSIS — N179 Acute kidney failure, unspecified: Secondary | ICD-10-CM | POA: Diagnosis not present

## 2021-04-03 DIAGNOSIS — W1811XA Fall from or off toilet without subsequent striking against object, initial encounter: Secondary | ICD-10-CM | POA: Diagnosis present

## 2021-04-03 DIAGNOSIS — I10 Essential (primary) hypertension: Secondary | ICD-10-CM | POA: Diagnosis not present

## 2021-04-03 DIAGNOSIS — Y92002 Bathroom of unspecified non-institutional (private) residence single-family (private) house as the place of occurrence of the external cause: Secondary | ICD-10-CM | POA: Diagnosis not present

## 2021-04-03 DIAGNOSIS — E1122 Type 2 diabetes mellitus with diabetic chronic kidney disease: Secondary | ICD-10-CM | POA: Diagnosis present

## 2021-04-03 DIAGNOSIS — Z6831 Body mass index (BMI) 31.0-31.9, adult: Secondary | ICD-10-CM | POA: Diagnosis not present

## 2021-04-03 DIAGNOSIS — Z20822 Contact with and (suspected) exposure to covid-19: Secondary | ICD-10-CM | POA: Diagnosis present

## 2021-04-03 DIAGNOSIS — F32A Depression, unspecified: Secondary | ICD-10-CM | POA: Diagnosis present

## 2021-04-03 DIAGNOSIS — G4733 Obstructive sleep apnea (adult) (pediatric): Secondary | ICD-10-CM | POA: Diagnosis not present

## 2021-04-03 DIAGNOSIS — R55 Syncope and collapse: Secondary | ICD-10-CM | POA: Diagnosis not present

## 2021-04-03 DIAGNOSIS — I129 Hypertensive chronic kidney disease with stage 1 through stage 4 chronic kidney disease, or unspecified chronic kidney disease: Secondary | ICD-10-CM | POA: Diagnosis present

## 2021-04-03 DIAGNOSIS — R42 Dizziness and giddiness: Secondary | ICD-10-CM | POA: Diagnosis not present

## 2021-04-03 DIAGNOSIS — Z79899 Other long term (current) drug therapy: Secondary | ICD-10-CM

## 2021-04-03 DIAGNOSIS — F419 Anxiety disorder, unspecified: Secondary | ICD-10-CM | POA: Diagnosis present

## 2021-04-03 DIAGNOSIS — E861 Hypovolemia: Secondary | ICD-10-CM | POA: Diagnosis present

## 2021-04-03 LAB — URINALYSIS, ROUTINE W REFLEX MICROSCOPIC
Bilirubin Urine: NEGATIVE
Glucose, UA: >=500 mg/dL — AB
Ketones, ur: NEGATIVE mg/dL
Leukocytes,Ua: NEGATIVE
Nitrite: NEGATIVE
Protein, ur: NEGATIVE mg/dL
Specific Gravity, Urine: 1.011 (ref 1.005–1.030)
pH: 5 (ref 5.0–8.0)

## 2021-04-03 LAB — CBC WITH DIFFERENTIAL/PLATELET
Abs Immature Granulocytes: 0.03 10*3/uL (ref 0.00–0.07)
Basophils Absolute: 0 10*3/uL (ref 0.0–0.1)
Basophils Relative: 0 %
Eosinophils Absolute: 0.1 10*3/uL (ref 0.0–0.5)
Eosinophils Relative: 1 %
HCT: 37.9 % — ABNORMAL LOW (ref 39.0–52.0)
Hemoglobin: 12.3 g/dL — ABNORMAL LOW (ref 13.0–17.0)
Immature Granulocytes: 0 %
Lymphocytes Relative: 19 %
Lymphs Abs: 1.8 10*3/uL (ref 0.7–4.0)
MCH: 26.9 pg (ref 26.0–34.0)
MCHC: 32.5 g/dL (ref 30.0–36.0)
MCV: 82.8 fL (ref 80.0–100.0)
Monocytes Absolute: 0.6 10*3/uL (ref 0.1–1.0)
Monocytes Relative: 6 %
Neutro Abs: 7.2 10*3/uL (ref 1.7–7.7)
Neutrophils Relative %: 74 %
Platelets: 277 10*3/uL (ref 150–400)
RBC: 4.58 MIL/uL (ref 4.22–5.81)
RDW: 15.7 % — ABNORMAL HIGH (ref 11.5–15.5)
WBC: 9.7 10*3/uL (ref 4.0–10.5)
nRBC: 0 % (ref 0.0–0.2)

## 2021-04-03 LAB — HEPATIC FUNCTION PANEL
ALT: 18 U/L (ref 0–44)
AST: 25 U/L (ref 15–41)
Albumin: 3.7 g/dL (ref 3.5–5.0)
Alkaline Phosphatase: 146 U/L — ABNORMAL HIGH (ref 38–126)
Bilirubin, Direct: <0.1 mg/dL (ref 0.0–0.2)
Total Bilirubin: 0.3 mg/dL (ref 0.3–1.2)
Total Protein: 7.2 g/dL (ref 6.5–8.1)

## 2021-04-03 LAB — CBG MONITORING, ED: Glucose-Capillary: 214 mg/dL — ABNORMAL HIGH (ref 70–99)

## 2021-04-03 LAB — BASIC METABOLIC PANEL
Anion gap: 15 (ref 5–15)
BUN: 53 mg/dL — ABNORMAL HIGH (ref 6–20)
CO2: 19 mmol/L — ABNORMAL LOW (ref 22–32)
Calcium: 8.9 mg/dL (ref 8.9–10.3)
Chloride: 98 mmol/L (ref 98–111)
Creatinine, Ser: 5.02 mg/dL — ABNORMAL HIGH (ref 0.61–1.24)
GFR, Estimated: 14 mL/min — ABNORMAL LOW (ref >60)
Glucose, Bld: 277 mg/dL — ABNORMAL HIGH (ref 70–99)
Potassium: 3.8 mmol/L (ref 3.5–5.1)
Sodium: 132 mmol/L — ABNORMAL LOW (ref 135–145)

## 2021-04-03 LAB — PROTEIN / CREATININE RATIO, URINE
Creatinine, Urine: 142.51 mg/dL
Protein Creatinine Ratio: 0.27 mg/mg{Cre} — ABNORMAL HIGH (ref 0.00–0.15)
Total Protein, Urine: 39 mg/dL

## 2021-04-03 LAB — TROPONIN I (HIGH SENSITIVITY)
Troponin I (High Sensitivity): 7 ng/L (ref <18)
Troponin I (High Sensitivity): 6 ng/L (ref <18)

## 2021-04-03 LAB — MAGNESIUM: Magnesium: 2.6 mg/dL — ABNORMAL HIGH (ref 1.7–2.4)

## 2021-04-03 MED ORDER — SODIUM CHLORIDE 0.9 % IV BOLUS
1000.0000 mL | Freq: Once | INTRAVENOUS | Status: AC
  Administered 2021-04-03: 1000 mL via INTRAVENOUS

## 2021-04-03 MED ORDER — FAMOTIDINE 20 MG PO TABS
10.0000 mg | ORAL_TABLET | Freq: Every day | ORAL | Status: DC
  Administered 2021-04-04 – 2021-04-06 (×3): 10 mg via ORAL
  Filled 2021-04-03 (×3): qty 1

## 2021-04-03 MED ORDER — SODIUM CHLORIDE 0.9% FLUSH
3.0000 mL | Freq: Two times a day (BID) | INTRAVENOUS | Status: DC
  Administered 2021-04-03 – 2021-04-05 (×4): 3 mL via INTRAVENOUS

## 2021-04-03 MED ORDER — SENNA 8.6 MG PO TABS: 1.0000 | ORAL_TABLET | Freq: Two times a day (BID) | ORAL | Status: DC | PRN

## 2021-04-03 MED ORDER — ONDANSETRON HCL 4 MG PO TABS
4.0000 mg | ORAL_TABLET | Freq: Four times a day (QID) | ORAL | Status: DC | PRN
Start: 2021-04-03 — End: 2021-04-06

## 2021-04-03 MED ORDER — ONDANSETRON HCL 4 MG/2ML IJ SOLN: 4.0000 mg | Freq: Four times a day (QID) | INTRAMUSCULAR | Status: DC | PRN

## 2021-04-03 MED ORDER — HEPARIN SODIUM (PORCINE) 5000 UNIT/ML IJ SOLN
5000.0000 [IU] | Freq: Three times a day (TID) | INTRAMUSCULAR | Status: DC
  Administered 2021-04-03 – 2021-04-06 (×8): 5000 [IU] via SUBCUTANEOUS
  Filled 2021-04-03 (×8): qty 1

## 2021-04-03 MED ORDER — ACETAMINOPHEN 500 MG PO TABS
1000.0000 mg | ORAL_TABLET | Freq: Once | ORAL | Status: AC
  Administered 2021-04-03: 1000 mg via ORAL
  Filled 2021-04-03: qty 2

## 2021-04-03 MED ORDER — INSULIN ASPART 100 UNIT/ML IJ SOLN
0.0000 [IU] | Freq: Every day | INTRAMUSCULAR | Status: DC
  Administered 2021-04-03: 2 [IU] via SUBCUTANEOUS
  Administered 2021-04-04: 3 [IU] via SUBCUTANEOUS

## 2021-04-03 MED ORDER — TRAZODONE HCL 50 MG PO TABS
100.0000 mg | ORAL_TABLET | Freq: Every day | ORAL | Status: DC
  Administered 2021-04-03: 100 mg via ORAL
  Filled 2021-04-03: qty 2

## 2021-04-03 MED ORDER — LINACLOTIDE 72 MCG PO CAPS
72.0000 ug | ORAL_CAPSULE | Freq: Every day | ORAL | Status: DC | PRN
  Filled 2021-04-03: qty 1

## 2021-04-03 MED ORDER — BUSPIRONE HCL 5 MG PO TABS
10.0000 mg | ORAL_TABLET | Freq: Two times a day (BID) | ORAL | Status: DC
  Administered 2021-04-04 – 2021-04-06 (×5): 10 mg via ORAL
  Filled 2021-04-03: qty 2
  Filled 2021-04-03: qty 1
  Filled 2021-04-03 (×3): qty 2

## 2021-04-03 MED ORDER — ACETAMINOPHEN 650 MG RE SUPP: 650.0000 mg | Freq: Four times a day (QID) | RECTAL | Status: DC | PRN

## 2021-04-03 MED ORDER — SODIUM CHLORIDE 0.9 % IV SOLN: INTRAVENOUS | Status: DC

## 2021-04-03 MED ORDER — INSULIN ASPART 100 UNIT/ML IJ SOLN
0.0000 [IU] | Freq: Three times a day (TID) | INTRAMUSCULAR | Status: DC
  Administered 2021-04-04: 2 [IU] via SUBCUTANEOUS
  Administered 2021-04-04: 3 [IU] via SUBCUTANEOUS
  Administered 2021-04-04: 4 [IU] via SUBCUTANEOUS
  Administered 2021-04-05: 2 [IU] via SUBCUTANEOUS

## 2021-04-03 MED ORDER — HYDROMORPHONE HCL 2 MG PO TABS
1.0000 mg | ORAL_TABLET | Freq: Four times a day (QID) | ORAL | Status: DC | PRN
  Administered 2021-04-04 (×2): 2 mg via ORAL
  Filled 2021-04-03 (×2): qty 1

## 2021-04-03 MED ORDER — HYDRALAZINE HCL 25 MG PO TABS: 25.0000 mg | ORAL_TABLET | Freq: Three times a day (TID) | ORAL | Status: DC | PRN

## 2021-04-03 MED ORDER — GABAPENTIN 100 MG PO CAPS
200.0000 mg | ORAL_CAPSULE | Freq: Every day | ORAL | Status: DC
  Administered 2021-04-04 – 2021-04-06 (×4): 200 mg via ORAL
  Filled 2021-04-03 (×4): qty 2

## 2021-04-03 MED ORDER — PANTOPRAZOLE SODIUM 40 MG PO TBEC
40.0000 mg | DELAYED_RELEASE_TABLET | Freq: Every day | ORAL | Status: DC
  Administered 2021-04-04 – 2021-04-06 (×3): 40 mg via ORAL
  Filled 2021-04-03 (×3): qty 1

## 2021-04-03 MED ORDER — ACETAMINOPHEN 325 MG PO TABS: 650.0000 mg | ORAL_TABLET | Freq: Four times a day (QID) | ORAL | Status: DC | PRN

## 2021-04-03 MED ORDER — DICYCLOMINE HCL 10 MG PO CAPS
10.0000 mg | ORAL_CAPSULE | Freq: Two times a day (BID) | ORAL | Status: DC
  Administered 2021-04-03 – 2021-04-06 (×6): 10 mg via ORAL
  Filled 2021-04-03 (×6): qty 1

## 2021-04-03 MED ORDER — METHOCARBAMOL 500 MG PO TABS
1000.0000 mg | ORAL_TABLET | Freq: Two times a day (BID) | ORAL | Status: DC | PRN
  Administered 2021-04-04: 1000 mg via ORAL
  Filled 2021-04-03: qty 2

## 2021-04-03 MED ORDER — SERTRALINE HCL 100 MG PO TABS
100.0000 mg | ORAL_TABLET | Freq: Every day | ORAL | Status: DC
  Administered 2021-04-04 – 2021-04-06 (×3): 100 mg via ORAL
  Filled 2021-04-03 (×3): qty 1

## 2021-04-03 MED ORDER — ATORVASTATIN CALCIUM 40 MG PO TABS
40.0000 mg | ORAL_TABLET | Freq: Every day | ORAL | Status: DC
  Administered 2021-04-04 – 2021-04-06 (×3): 40 mg via ORAL
  Filled 2021-04-03 (×3): qty 1

## 2021-04-03 NOTE — ED Provider Notes (Signed)
[facility] EMERGENCY DEPARTMENT Provider Note   CSN: [id number] Arrival date & time: 04/03/21  1649     History Chief Complaint  Patient presents with  . Loss of Consciousness  . Seizures    Richard Mendoza is a 45 y.o. male.  HPI   45 year old male with past medical history of HTN, HLD, DM, left BKA presents the emergency department with concern for syncope versus seizure.  Patient states he has been feeling unwell for the past 2 to 3 days, fatigued.  He has been having episodes where he feels lightheaded with blurred vision that are brief and self resolved.  Today while doing physical work he started having severe sharp bilateral neck pain that caused him to need to sit down on the toilet.  Following this it is reported that he passed out, fell over and had shaking-like activity.  No tongue biting or incontinence.  No history of seizures.  Patient is currently complaining of a mild frontal headache, posterior neck pain and right knee pain.  Vision is currently baseline, no other acute neurologic complaints.  No recent illness.  Past Medical History:  Diagnosis Date  . DIABETES MELLITUS, TYPE II, UNCONTROLLED 03/17/2009  . DM 12/08/2008  . HYPERLIPIDEMIA 03/17/2009  . HYPERTENSION 12/08/2008  . 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 Abnormal weight loss 12/09/2020  . Change in bowel habit 12/09/2020  . Constipation 12/09/2020  . Epigastric pain 12/09/2020  . Drug-induced constipation 12/09/2020  . Periumbilical pain 18/29/9371  . Fatty liver 12/09/2020  . S/P BKA (below knee amputation), left ([place]) 11/20/2020  . Necrotizing fasciitis of ankle and foot ([place]) 11/07/2020  . History of 2019 novel coronavirus disease (COVID-19) 09/01/2020  . Major depressive disorder, recurrent, in remission ([place]) 09/01/2020  . Pain management contract signed 09/07/2019  . Evaluation by psychiatric service required 08/18/2019  .  History of depression 08/18/2019  . Primary osteoarthritis of both shoulders 07/16/2019  . 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 Chronic pain of both shoulders 07/16/2019  . Cervical radicular pain 07/16/2019  . S/P cervical spinal fusion 07/16/2019  . Cervical spondylosis 07/16/2019  . Cervical facet joint syndrome 07/16/2019  . Chronic pain syndrome 07/16/2019  . Anemia 05/25/2019  . Osteomyelitis of right foot ([place]) 05/25/2019  . Diabetic foot infection ([place]) 03/05/2019  . Abscess 11/25/2018  . Diabetic peripheral neuropathy associated with type 2 diabetes mellitus ([place]) 06/16/2018  . Hyperlipidemia associated with type 2 diabetes mellitus ([place]) 06/16/2018  . Intractable nausea and vomiting 10/21/2017  . HNP (herniated nucleus pulposus), cervical 09/09/2017  . Erectile dysfunction 07/31/2017  . Gastroesophageal reflux disease 07/31/2017  . Rotator cuff syndrome of left shoulder 07/31/2017  . Cellulitis of right leg 07/30/2016  . Tobacco abuse disorder 07/30/2016  . Routine general medical examination at a health care facility 01/20/2014  . Diabetic neuropathy, type I diabetes mellitus ([place]) 01/20/2014  . 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 HYPERLIPIDEMIA 03/17/2009  . DM type 2 with diabetic peripheral neuropathy ([place]) 03/02/2009  . DM 12/08/2008  . OBSTRUCTIVE SLEEP APNEA 12/08/2008  . Essential hypertension 12/08/2008  . Allergic rhinitis 12/15/2007  . Other malaise and fatigue 12/15/2007    Past Surgical History:  Procedure Laterality Date  . AMPUTATION Left 11/07/2020   Procedure: 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;  Surgeon: Sharlotte Alamo, DPM;  Location: [facility];  Service: Podiatry;  Laterality: Left;  . AMPUTATION  Left 11/16/2020   Procedure: AMPUTATION BELOW KNEE;  Surgeon: Algernon Huxley, MD;  Location: [facility];  Service: Vascular;  Laterality: Left;  . ANTERIOR CERVICAL DECOMP/DISCECTOMY FUSION N/A 09/09/2017   Procedure:  ANTERIOR CERVICAL DECOMPRESSION/DISCECTOMY FUSION CERVICAL 6- CERVICAL 7;  Surgeon: Ashok Pall, MD;  Location: [place];  Service: Neurosurgery;  Laterality: N/A;  ANTERIOR CERVICAL DECOMPRESSION/DISCECTOMY FUSION CERVICAL 6- CERVICAL 7  . APPENDECTOMY    . I & D EXTREMITY Right 10/03/2017   Procedure: IRRIGATION AND DEBRIDEMENT RIGHT WRIST;  Surgeon: Leanora Cover, MD;  Location: [place];  Service: Orthopedics;  Laterality: Right;  . I & D EXTREMITY Right 11/26/2018   Procedure: IRRIGATION AND DEBRIDEMENT FASCIA ON RIGHT FOOT;  Surgeon: Samara Deist, DPM;  Location: [facility];  Service: Podiatry;  Laterality: Right;  . 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 INCISION AND DRAINAGE Left 11/07/2020   Procedure: INCISION AND DRAINAGE;  Surgeon: Sharlotte Alamo, DPM;  Location: [facility];  Service: Podiatry;  Laterality: Left;  . IRRIGATION AND DEBRIDEMENT FOOT Left 11/11/2020   Procedure: IRRIGATION AND DEBRIDEMENT FOOT;  Surgeon: Sharlotte Alamo, DPM;  Location: [facility];  Service: Podiatry;  Laterality: Left;  . METATARSAL HEAD EXCISION Right 05/15/2019   Procedure: OSTECTOMY;MET HEAD 5;  Surgeon: Samara Deist, DPM;  Location: [facility];  Service: Podiatry;  Laterality: Right;  . osteomylitis    . ROTATOR CUFF REPAIR Left        Family History  Problem Relation Age of Onset  . Diabetes Mother   . Heart disease Father   . Diabetes Father   . Arthritis Other   . Hyperlipidemia Other   . Hypertension Other   . Cancer Other        breast  . Mental illness Neg Hx     Social History   Tobacco Use  . Smoking status: Former Smoker    Packs/day: 1.00    Years: 17.00    Pack years: 17.00    Types: Cigarettes    Quit date: 01/14/2019    Years since quitting: 2.2  . Smokeless tobacco: Never Used  Vaping Use  . Vaping Use: Never used   Substance Use Topics  . Alcohol use: Yes    Comment: rare  . Drug use: No    Home Medications Prior to Admission medications   Medication Sig Start Date End Date Taking? Authorizing Provider  atorvastatin (LIPITOR) 40 MG tablet Take 40 mg by mouth daily.  08/23/17   [provider]  busPIRone (BUSPAR) 15 MG tablet Take 15 mg by mouth 2 (two) times daily.    [provider]  [employer] SENNA 8.6 MG tablet Take 1 tablet by mouth 2 (two) times daily as needed for constipation.  Patient not taking: No sig reported 10/31/17   [provider]  dicyclomine (BENTYL) 10 MG capsule Take 10 mg by mouth in the morning and at bedtime. 04/27/20   [provider]  famotidine (PEPCID) 20 MG tablet Take 20 mg by mouth daily.    [provider]  fenofibrate (TRICOR) 48 MG tablet Take 48 mg by mouth daily. 09/07/20   [provider]  ferrous sulfate 325 (65 FE) MG tablet Take 325 mg by mouth 2 (two) times daily with a meal.    [provider]  FIASP 100 UNIT/ML SOLN Inject  into the skin continuous. Via pump 10/09/20   [provider]  furosemide (LASIX) 40 MG tablet Take 40 mg by mouth daily.    [provider]  gabapentin (NEURONTIN) 300 MG capsule Take 1 capsule (300 mg total) by mouth 2 (two) times daily with breakfast and lunch AND 2 capsules (600 mg total) at bedtime. 01/20/21   Kris Hartmann, NP  linaclotide Rolan Lipa) 72 MCG capsule Take 72 mcg by mouth daily as needed (constipation).    [provider]  lisinopril (ZESTRIL) 20 MG tablet Take 1 tablet (20 mg total) by mouth daily. 11/21/20 12/21/20  Antonieta Pert, MD  MAGNESIUM PO Take 1 tablet by mouth daily.    [provider]  metoCLOPramide (REGLAN) 10 MG tablet Take 10 mg by mouth 3 (three) times daily as needed for nausea or vomiting. 05/25/19   [provider]  Multiple Vitamin (MULTIVITAMIN WITH MINERALS) TABS tablet Take 1 tablet by mouth daily. Patient  not taking: No sig reported    [provider]  ondansetron (ZOFRAN) 8 MG tablet Take 8 mg by mouth every 6 (six) hours as needed for nausea/vomiting.    [provider]  oxyCODONE (OXY IR/ROXICODONE) 5 MG immediate release tablet Take 1 tablet (5 mg total) by mouth every 4 (four) hours as needed for moderate pain or severe pain. 11/21/20   Stegmayer, Joelene Millin A, PA-C  pantoprazole (PROTONIX) 40 MG tablet Take 40 mg by mouth daily. 08/23/17   [provider]  pioglitazone (ACTOS) 30 MG tablet Take 30 mg by mouth daily.  06/29/19   [provider]  POTASSIUM PO Take 1 capsule by mouth in the morning and at bedtime.    [provider]  sertraline (ZOLOFT) 100 MG tablet Take 100 mg by mouth daily.  10/31/17   [provider]  sildenafil (REVATIO) 20 MG tablet Take 100 mg by mouth daily as needed (erectile dysfunction).    [provider]  traZODone (DESYREL) 50 MG tablet Take 50 mg by mouth at bedtime.    [provider]  vitamin B-12 (CYANOCOBALAMIN) 1000 MCG tablet Take 1,000 mcg by mouth in the morning and at bedtime.  01/27/19   [provider]    Allergies    Patient has no known allergies.  Review of Systems   Review of Systems  Constitutional: Positive for fatigue. Negative for chills and fever.  HENT: Negative for congestion.   Eyes: Positive for visual disturbance. Negative for pain and discharge.  Respiratory: Negative for chest tightness and shortness of breath.   Cardiovascular: Negative for chest pain and palpitations.  Gastrointestinal: Negative for abdominal pain, diarrhea and vomiting.  Genitourinary: Negative for dysuria.  Musculoskeletal: Positive for neck pain.  Skin: Negative for rash.  Neurological: Positive for dizziness, seizures, syncope, light-headedness and headaches. Negative for facial asymmetry and weakness.    Physical Exam Updated Vital Signs BP 103/64   Pulse 91   Temp 98.5 F  (36.9 C)   Resp 14   Ht _0  (1.778 m)   Wt 99.8 kg   SpO2 99%   BMI 31.57 kg/m   Physical Exam Vitals and nursing note reviewed.  Constitutional:      Appearance: Normal appearance.  HENT:     Head: Normocephalic.     Mouth/Throat:     Mouth: Mucous membranes are moist.  Eyes:     Extraocular Movements: Extraocular movements intact.     Pupils: Pupils are equal, round, and reactive to  light.  Cardiovascular:     Rate and Rhythm: Normal rate.  Pulmonary:     Effort: Pulmonary effort is normal. No respiratory distress.  Abdominal:     Palpations: Abdomen is soft.     Tenderness: There is no abdominal tenderness.  Musculoskeletal:        General: No swelling.     Comments: TTP of b/l trapezius, no carotid bruit, L BKA  Skin:    General: Skin is warm.  Neurological:     General: No focal deficit present.     Mental Status: He is alert and oriented to person, place, and time. Mental status is at baseline.     Cranial Nerves: No cranial nerve deficit.     Motor: No weakness.  Psychiatric:        Mood and Affect: Mood normal.     ED Results / Procedures / Treatments   Labs (all labs ordered are listed, but only abnormal results are displayed) Labs Reviewed  CBC WITH DIFFERENTIAL/PLATELET  BASIC METABOLIC PANEL  MAGNESIUM  TROPONIN I (HIGH SENSITIVITY)    EKG None  Radiology No results found.  Procedures Procedures   Medications Ordered in ED Medications - No data to display  ED Course  I have reviewed the triage vital signs and the nursing notes.  Pertinent labs & imaging results that were available during my care of the patient were reviewed by me and considered in my medical decision making (see chart for details).    MDM Rules/Calculators/A&P                          45 year old male presents the emergency department after syncope versus seizure with head and neck pain.  Patient is orthostatic on arrival, IV fluids ordered.  Neck pain is  posterior, reproducible in the trapezius muscles, no midline spinal tenderness.  Head CT shows no acute finding.  Blood work shows renal failure which is an acute change to his blood work 3 months ago.  No acute electrolyte abnormalities.  Urinalysis does not show an infection.  CT renal study shows no obstruction or stone.  Nephrology was consulted, requested creatinine/protein urine study.  We will follow-up and consult on the patient.  I believe the patient most likely had a syncopal episode today secondary to his orthostasis, no other symptoms of seizure.  Currently his blood pressure and vitals are normal, he is still complaining of a mild frontal headache.  Patients evaluation and results requires admission for further treatment and care. Patient agrees with admission plan, offers no new complaints and is stable/unchanged at time of admit.  Final Clinical Impression(s) / ED Diagnoses Final diagnoses:  None    Rx / DC Orders ED Discharge Orders    None       Lorelle Gibbs, DO 04/03/21 2304

## 2021-04-03 NOTE — H&P (Signed)
History and Physical    Richard Mendoza YQM:[id number] DOB: Oct 20, 1976 DOA: 04/03/2021  PCP: Tracie Harrier, MD   Patient coming from: Home   Chief Complaint: Neck pain, LOC, lightheadedness    HPI: Richard Mendoza is a 45 y.o. male with medical history significant for insulin-dependent diabetes mellitus, hypertension, chronic pain, depression, anxiety, and mild renal insufficiency, now presenting to the emergency department for evaluation of lightheadedness on standing, neck pain, and transient loss of consciousness.  Patient reports that he has had lightheadedness on standing for the past 2 to 3 days, went on to develop some posterior neck pain today without any inciting event, and then had a brief loss of consciousness.  The transient LOC was witnessed by his son who reported that there was some shaking.  Patient denies any incontinence, tongue bite, or other injury.  He has had lightheadedness on standing in the past but has never lost consciousness and denies any history of seizure.  Denies any recent fevers or chills.  Denies any recent vomiting or diarrhea.  Has not noticed any change in his urination.  ED Course: Upon arrival to the ED, patient is found to be afebrile, saturating well on room air, and with 17 mmHg drop in SBP upon standing.  EKG features sinus rhythm.  Head CT negative for acute intracranial abnormality.  CT renal stone study was negative.  Radiographs of the right knee negative for acute findings.  Chemistry panel notable for creatinine of 5.02, up from 1.3 in February.  CBC is unremarkable.  Troponin normal x2.  Nephrology was consulted by the ED physician and the patient was given a liter of saline.  Review of Systems:  All other systems reviewed and apart from HPI, are negative.  Past Medical History:  Diagnosis Date  . DIABETES MELLITUS, TYPE II, UNCONTROLLED 03/17/2009  . DM 12/08/2008  . HYPERLIPIDEMIA 03/17/2009  . HYPERTENSION 12/08/2008  . 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 AMPUTATION Left 11/07/2020   Procedure: 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;  Surgeon: Sharlotte Alamo, DPM;  Location: [facility];  Service: Podiatry;  Laterality: Left;  . AMPUTATION Left 11/16/2020   Procedure: AMPUTATION BELOW KNEE;  Surgeon: Algernon Huxley, MD;  Location: [facility];  Service: Vascular;  Laterality: Left;  . ANTERIOR CERVICAL DECOMP/DISCECTOMY FUSION N/A 09/09/2017   Procedure: ANTERIOR CERVICAL DECOMPRESSION/DISCECTOMY FUSION CERVICAL 6- CERVICAL 7;  Surgeon: Ashok Pall, MD;  Location: [place];  Service: Neurosurgery;  Laterality: N/A;  ANTERIOR CERVICAL DECOMPRESSION/DISCECTOMY FUSION CERVICAL 6- CERVICAL 7  . APPENDECTOMY    . I & D EXTREMITY Right 10/03/2017   Procedure: IRRIGATION AND DEBRIDEMENT RIGHT WRIST;  Surgeon: Leanora Cover, MD;  Location: [place];  Service: Orthopedics;  Laterality: Right;  . I & D EXTREMITY Right 11/26/2018   Procedure: IRRIGATION AND DEBRIDEMENT FASCIA ON RIGHT FOOT;  Surgeon: Samara Deist, DPM;  Location: [facility];  Service: Podiatry;  Laterality: Right;  . 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 INCISION AND DRAINAGE Left 11/07/2020   Procedure: INCISION AND DRAINAGE;  Surgeon: Sharlotte Alamo, DPM;  Location: [facility];  Service: Podiatry;  Laterality: Left;  . IRRIGATION AND DEBRIDEMENT FOOT Left 11/11/2020   Procedure: IRRIGATION AND DEBRIDEMENT FOOT;  Surgeon: Sharlotte Alamo, DPM;  Location: [facility];  Service: Podiatry;  Laterality: Left;  . METATARSAL HEAD EXCISION Right 05/15/2019   Procedure: OSTECTOMY;MET HEAD 5;  Surgeon: Samara Deist, DPM;  Location: [facility];  Service: Podiatry;  Laterality: Right;  . osteomylitis    . ROTATOR CUFF REPAIR Left     Social History:   reports that he quit smoking  about 2 years ago. His smoking use included cigarettes. He has a 17.00 pack-year smoking history. He has never used smokeless tobacco. He reports current alcohol use. He reports that he does not use drugs.  No Known Allergies  Family History  Problem Relation Age of Onset  . Diabetes Mother   . Heart disease Father   . Diabetes Father   . Arthritis Other   . Hyperlipidemia Other   . Hypertension Other   . Cancer Other        breast  . Mental illness Neg Hx      Prior to Admission medications   Medication Sig Start Date End Date Taking? Authorizing Provider  amLODipine-benazepril (LOTREL) 10-40 MG capsule Take 1 capsule by mouth daily. 02/03/21  Yes [provider]  atorvastatin (LIPITOR) 40 MG tablet Take 40 mg by mouth daily.  08/23/17  Yes [provider]  busPIRone (BUSPAR) 15 MG tablet Take 15 mg by mouth 2 (two) times daily.   Yes [provider]  [facility] SENNA 8.6 MG tablet Take 1 tablet by mouth 2 (two) times daily as needed for constipation. 10/31/17  Yes [provider]  dicyclomine (BENTYL) 10 MG capsule Take 10 mg by mouth in the morning and at bedtime. 04/27/20  Yes [provider]  famotidine (PEPCID) 20 MG tablet Take 20 mg by mouth daily.   Yes [provider]  fenofibrate (TRICOR) 48 MG tablet Take 48 mg by mouth daily. 09/07/20  Yes [provider]  FIASP 100 UNIT/ML SOLN Inject into the skin continuous. Via pump 10/09/20  Yes [provider]  furosemide (LASIX) 40 MG tablet Take 40 mg by mouth daily.   Yes [provider]  gabapentin (NEURONTIN) 100 MG capsule Take 200 mg by mouth 2 (two) times daily.   Yes [provider]  linaclotide (LINZESS) 72 MCG capsule Take 72 mcg by mouth daily as needed (constipation).   Yes [provider]  methocarbamol (ROBAXIN) 500 MG tablet Take 2 tablets by mouth 2 (two) times daily as needed for muscle spasms. 03/17/21  Yes [provider]  metoCLOPramide (REGLAN) 10 MG tablet Take 10 mg by mouth daily. 05/25/19  Yes [provider]  ondansetron (ZOFRAN) 8 MG tablet Take 8 mg by mouth every 6 (six) hours as needed for nausea/vomiting.   Yes [provider]  pantoprazole (PROTONIX) 40 MG tablet Take 40 mg by mouth daily. 08/23/17  Yes [provider]  POTASSIUM PO Take 10 mEq by mouth daily.   Yes [provider]  sertraline (ZOLOFT) 100 MG tablet Take 100 mg by mouth daily.  10/31/17  Yes [provider]  sildenafil (REVATIO) 20 MG tablet Take 100 mg by mouth daily as needed (erectile dysfunction).   Yes [provider]  traZODone (DESYREL) 100 MG tablet Take 100 mg by mouth at bedtime.   Yes [provider]  vitamin B-12 (CYANOCOBALAMIN) 1000 MCG tablet Take 1,000 mcg by mouth in the morning and at bedtime.  01/27/19  Yes [provider]  gabapentin (NEURONTIN) 300 MG capsule Take 1 capsule (300 mg total) by mouth 2 (two) times daily with breakfast  and lunch AND 2 capsules (600 mg total) at bedtime. Patient not taking: Reported on 04/03/2021 01/20/21   Kris Hartmann, NP  lisinopril (ZESTRIL) 20 MG tablet Take 1 tablet (20 mg total) by mouth daily. Patient not taking: Reported on 04/03/2021 11/21/20 12/21/20  Antonieta Pert, MD  oxyCODONE (OXY IR/ROXICODONE) 5 MG immediate release tablet Take 1 tablet (5 mg total) by mouth every 4 (four) hours as needed for moderate pain or severe pain. Patient not taking: No sig reported 11/21/20   Sela Hua, PA-C    Physical Exam: Vitals:   04/03/21 1915 04/03/21 1930 04/03/21 2000 04/03/21 2030  BP: 99/68 119/77 110/73 118/76  Pulse: 89 93 87 91  Resp: 20 (!) 27 12 18   Temp:      TempSrc:      SpO2: 97% 98% 100% 100%  Weight:      Height:        Constitutional: NAD, calm  Eyes: PERTLA, lids and conjunctivae normal ENMT: Mucous membranes are moist. Posterior pharynx clear of any exudate or lesions.   Neck: supple,  no masses, tender paraspinal soft tissues Respiratory: clear to auscultation bilaterally, no wheezing, no crackles. No accessory muscle use.  Cardiovascular: S1 & S2 heard, regular rate and rhythm. No extremity edema.  Abdomen: No distension, no tenderness, soft. Bowel sounds active.  Musculoskeletal: no clubbing / cyanosis. Status-post left BKA.   Skin: no significant rashes, lesions, ulcers. Warm, dry, well-perfused. Neurologic: CN 2-12 grossly intact. Sensation intact. Strength 5/5 in all 4 limbs.  Psychiatric: Alert and oriented to person, place, and situation. Pleasant and cooperative.    Labs and Imaging on Admission: I have personally reviewed following labs and imaging studies  CBC: Recent Labs  Lab 04/03/21 1849  WBC 9.7  NEUTROABS 7.2  HGB 12.3*  HCT 37.9*  MCV 82.8  PLT 254   Basic Metabolic Panel: Recent Labs  Lab 04/03/21 1849  NA 132*  K 3.8  CL 98  CO2 19*  GLUCOSE 277*  BUN 53*  CREATININE 5.02*  CALCIUM 8.9  MG 2.6*   GFR: Estimated Creatinine Clearance: 22 mL/min (A) (by C-G formula based on SCr of 5.02 mg/dL (H)). Liver Function Tests: Recent Labs  Lab 04/03/21 2110  AST 25  ALT 18  ALKPHOS 146*  BILITOT 0.3  PROT 7.2  ALBUMIN 3.7   No results for input(s): LIPASE, AMYLASE in the last 168 hours. No results for input(s): AMMONIA in the last 168 hours. Coagulation Profile: No results for input(s): INR, PROTIME in the last 168 hours. Cardiac Enzymes: No results for input(s): CKTOTAL, CKMB, CKMBINDEX, TROPONINI in the last 168 hours. BNP (last 3 results) No results for input(s): PROBNP in the last 8760 hours. HbA1C: No results for input(s): HGBA1C in the last 72 hours. CBG: Recent Labs  Lab 04/03/21 2331  GLUCAP 214*   Lipid Profile: No results for input(s): CHOL, HDL, LDLCALC, TRIG, CHOLHDL, LDLDIRECT in the last 72 hours. Thyroid Function Tests: No results for input(s): TSH, T4TOTAL, FREET4, T3FREE, THYROIDAB in the last 72  hours. Anemia Panel: No results for input(s): VITAMINB12, FOLATE, FERRITIN, TIBC, IRON, RETICCTPCT in the last 72 hours. Urine analysis:    Component Value Date/Time   COLORURINE YELLOW 04/03/2021 2026   APPEARANCEUR CLEAR 04/03/2021 2026   LABSPEC 1.011 04/03/2021 2026   PHURINE 5.0 04/03/2021 2026   GLUCOSEU >=500 (A) 04/03/2021 2026   HGBUR SMALL (A) 04/03/2021 2026   BILIRUBINUR NEGATIVE 04/03/2021 2026   KETONESUR NEGATIVE 04/03/2021  2026   PROTEINUR NEGATIVE 04/03/2021 2026   UROBILINOGEN 0.2 07/31/2011 2217   NITRITE NEGATIVE 04/03/2021 2026   LEUKOCYTESUR NEGATIVE 04/03/2021 2026   Sepsis Labs: @LABRCNTIP (procalcitonin:4,lacticidven:4) )No results found for this or any previous visit (from the past 240 hour(s)).   Radiological Exams on Admission: CT Head Wo Contrast  Result Date: 04/03/2021 CLINICAL DATA:  Dizziness with fall and trauma to left-sided head a EXAM: CT HEAD WITHOUT CONTRAST TECHNIQUE: Contiguous axial images were obtained from the base of the skull through the vertex without intravenous contrast. COMPARISON:  None. FINDINGS: Brain: No evidence of acute infarction, hemorrhage, hydrocephalus, extra-axial collection or mass lesion/mass effect. Vascular: No hyperdense vessel or unexpected calcification. Skull: Normal. Negative for fracture or focal lesion. Sinuses/Orbits: The paranasal sinuses and mastoid air cells are predominantly clear. Orbits are grossly unremarkable. Other: Soft tissue density subcutaneous nodules measuring 8 mm at the right vertex, and 1.3 cm at the left occiput. IMPRESSION: 1. No acute intracranial abnormality. 2. Soft tissue density subcutaneous nodules measuring 8 mm at the right vertex, and 1.3 cm at the left occiput. These are nonspecific and may represent sebaceous cysts. Correlate with physical exam. Electronically Signed   By: Dahlia Bailiff MD   On: 04/03/2021 21:39   DG Knee Complete 4 Views Right  Result Date: 04/03/2021 CLINICAL DATA:   Knee pain after fall from standing EXAM: RIGHT KNEE - COMPLETE 4+ VIEW COMPARISON:  May 20, 2018 FINDINGS: No evidence of fracture, dislocation, or joint effusion. Well corticated osseous irregularity along the lateral distal femoral condyle likely sequela prior injury. Superior and inferior patellar enthesophytes within an enthesophyte in the patellar tendon at its attachment to the tibia, unchanged from prior. Soft tissues are unremarkable. IMPRESSION: 1. No acute fracture or dislocation. No joint effusion. 2. Well corticated osseous irregularity along the lateral distal femoral condyle likely sequela prior injury. Electronically Signed   By: Dahlia Bailiff MD   On: 04/03/2021 20:28   CT Renal Stone Study  Result Date: 04/03/2021 CLINICAL DATA:  Flank pain EXAM: CT ABDOMEN AND PELVIS WITHOUT CONTRAST TECHNIQUE: Multidetector CT imaging of the abdomen and pelvis was performed following the standard protocol without IV contrast. COMPARISON:  10/20/2017 FINDINGS: Lower chest: No acute abnormality. Hepatobiliary: No focal liver abnormality is seen. No gallstones, gallbladder wall thickening, or biliary dilatation. Pancreas: Unremarkable. No pancreatic ductal dilatation or surrounding inflammatory changes. Spleen: Normal in size without focal abnormality. Adrenals/Urinary Tract: Adrenal glands are within normal limits. The kidneys demonstrate no evidence of renal calculi. The ureters are within normal limits bilaterally. The bladder is partially distended. Stomach/Bowel: The appendix has been surgically removed. No obstructive or inflammatory changes of the colon are seen. Small bowel and stomach are within normal limits. Vascular/Lymphatic: No significant vascular findings are present. No enlarged abdominal or pelvic lymph nodes. Left retroaortic renal vein is noted. Reproductive: Prostate is unremarkable. Other: No abdominal wall hernia or abnormality. No abdominopelvic ascites. Musculoskeletal: No acute bony  abnormality is noted. IMPRESSION: No renal calculi or obstructive changes are noted. No acute abnormality noted. Electronically Signed   By: Inez Catalina M.D.   On: 04/03/2021 21:09    EKG: Independently reviewed. Sinus rhythm, QTc 493 ms.   Assessment/Plan   1. 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 Syncope  - Presents after a transient LOC that occurred in setting of pain, reports lightheadedness on standing, and was noted to have brief shaking without incontinence or tongue bite  - There was a 17 mmHg drop in SBP on standing in ED; EKG with sinus rhythm and QTc 493 ms  - Plan to continue cardiac monitoring, check echocardiogram and EEG    3. Hypertension  - BP at goal  - Hold ACE-i, treat as needed only for now   4. Type II DM  - A1c was 12.0% in December 2021  - Check CBGs, use SSI    5. Chronic pain, depression, anxiety   - Continue Buspar, Zoloft, trazodone, gabapentin, Robaxin   6. OSA  - Continue CPAP qHS   7. Neck pain  - Patient complains of one day of posterior neck pain  - No preceding trauma, infectious s/s, or neurologic deficits  - Check radiographs, continue pain-control     DVT prophylaxis: sq heparin  Code Status: Full  Level of Care: Level of care: Telemetry Medical Family Communication: None present  Disposition Plan:  Patient is from: home  Anticipated d/c is to: home  Anticipated d/c date is: 04/06/21 Patient currently: Pending improvement in renal function, echocardiogram, EEG  Consults called: nephrology consulted by ED physician  Admission status: inpatient     Vianne Bulls,  MD [facility]  04/03/2021, 11:40 PM

## 2021-04-03 NOTE — ED Triage Notes (Addendum)
Reports having dizziness and not feeling well for 2-3 days. Today began having severe neck pain. Was sitting on toilet today and son said that he passed out, fell over and then had seizure activity. Pt denies any history of seizures. Has headache since the fall and right knee pain.

## 2021-04-03 NOTE — ED Notes (Signed)
Lab to add on hepatic function panel 

## 2021-04-04 ENCOUNTER — Inpatient Hospital Stay (HOSPITAL_COMMUNITY): Payer: Medicaid Other

## 2021-04-04 ENCOUNTER — Inpatient Hospital Stay (HOSPITAL_COMMUNITY)
Admit: 2021-04-04 | Discharge: 2021-04-04 | Disposition: A | Discharge status: Home / Self Care | Payer: Medicaid Other | Attending: Family Medicine | Admitting: Family Medicine

## 2021-04-04 DIAGNOSIS — R55 Syncope and collapse: Secondary | ICD-10-CM | POA: Diagnosis not present

## 2021-04-04 DIAGNOSIS — I951 Orthostatic hypotension: Secondary | ICD-10-CM

## 2021-04-04 LAB — CBC
HCT: 33.9 % — ABNORMAL LOW (ref 39.0–52.0)
Hemoglobin: 10.7 g/dL — ABNORMAL LOW (ref 13.0–17.0)
MCH: 26.8 pg (ref 26.0–34.0)
MCHC: 31.6 g/dL (ref 30.0–36.0)
MCV: 84.8 fL (ref 80.0–100.0)
Platelets: 213 10*3/uL (ref 150–400)
RBC: 4 MIL/uL — ABNORMAL LOW (ref 4.22–5.81)
RDW: 15.8 % — ABNORMAL HIGH (ref 11.5–15.5)
WBC: 7.4 10*3/uL (ref 4.0–10.5)
nRBC: 0 % (ref 0.0–0.2)

## 2021-04-04 LAB — GLUCOSE, CAPILLARY
Glucose-Capillary: 292 mg/dL — ABNORMAL HIGH (ref 70–99)
Glucose-Capillary: 304 mg/dL — ABNORMAL HIGH (ref 70–99)
Glucose-Capillary: 299 mg/dL — ABNORMAL HIGH (ref 70–99)
Glucose-Capillary: 268 mg/dL — ABNORMAL HIGH (ref 70–99)

## 2021-04-04 LAB — ECHOCARDIOGRAM COMPLETE
AR max vel: 4.06 cm2
AV Area VTI: 3.66 cm2
AV Area mean vel: 3.55 cm2
AV Mean grad: 6 mmHg
AV Peak grad: 9.7 mmHg
Ao pk vel: 1.56 m/s
Area-P 1/2: 4.31 cm2
Height: 70 in
S' Lateral: 3.1 cm
Weight: 3520 oz

## 2021-04-04 LAB — CBG MONITORING, ED
Glucose-Capillary: 226 mg/dL — ABNORMAL HIGH (ref 70–99)
Glucose-Capillary: 230 mg/dL — ABNORMAL HIGH (ref 70–99)

## 2021-04-04 LAB — BASIC METABOLIC PANEL
Anion gap: 8 (ref 5–15)
BUN: 47 mg/dL — ABNORMAL HIGH (ref 6–20)
CO2: 21 mmol/L — ABNORMAL LOW (ref 22–32)
Calcium: 8.3 mg/dL — ABNORMAL LOW (ref 8.9–10.3)
Chloride: 108 mmol/L (ref 98–111)
Creatinine, Ser: 4.11 mg/dL — ABNORMAL HIGH (ref 0.61–1.24)
GFR, Estimated: 17 mL/min — ABNORMAL LOW (ref >60)
Glucose, Bld: 220 mg/dL — ABNORMAL HIGH (ref 70–99)
Potassium: 3.5 mmol/L (ref 3.5–5.1)
Sodium: 137 mmol/L (ref 135–145)

## 2021-04-04 LAB — HEMOGLOBIN A1C
Hgb A1c MFr Bld: 12.3 % — ABNORMAL HIGH (ref 4.8–5.6)
Mean Plasma Glucose: 306.31 mg/dL

## 2021-04-04 LAB — SARS CORONAVIRUS 2 (TAT 6-24 HRS): SARS Coronavirus 2: NEGATIVE

## 2021-04-04 LAB — CORTISOL: Cortisol, Plasma: 17 ug/dL

## 2021-04-04 MED ORDER — SODIUM CHLORIDE 0.9 % IV BOLUS
1000.0000 mL | Freq: Once | INTRAVENOUS | Status: AC
  Administered 2021-04-04: 1000 mL via INTRAVENOUS

## 2021-04-04 MED ORDER — TRAZODONE HCL 100 MG PO TABS
100.0000 mg | ORAL_TABLET | Freq: Every day | ORAL | Status: DC
  Administered 2021-04-04 – 2021-04-05 (×2): 100 mg via ORAL
  Filled 2021-04-04 (×2): qty 1

## 2021-04-04 MED ORDER — SODIUM CHLORIDE 0.9 % IV BOLUS
500.0000 mL | Freq: Once | INTRAVENOUS | Status: AC
  Administered 2021-04-04: 500 mL via INTRAVENOUS

## 2021-04-04 MED ORDER — INSULIN DETEMIR 100 UNIT/ML ~~LOC~~ SOLN
30.0000 [IU] | Freq: Two times a day (BID) | SUBCUTANEOUS | Status: DC
  Administered 2021-04-04 – 2021-04-06 (×4): 30 [IU] via SUBCUTANEOUS
  Filled 2021-04-04 (×5): qty 0.3

## 2021-04-04 MED ORDER — GABAPENTIN 100 MG PO CAPS: 200.0000 mg | ORAL_CAPSULE | Freq: Every day | ORAL | Status: DC

## 2021-04-04 MED ORDER — SODIUM CHLORIDE 0.9 % IV SOLN: INTRAVENOUS | Status: AC

## 2021-04-04 MED ORDER — INSULIN DETEMIR 100 UNIT/ML ~~LOC~~ SOLN
15.0000 [IU] | Freq: Two times a day (BID) | SUBCUTANEOUS | Status: DC
  Administered 2021-04-04: 15 [IU] via SUBCUTANEOUS
  Filled 2021-04-04 (×2): qty 0.15

## 2021-04-04 MED ORDER — INSULIN DETEMIR 100 UNIT/ML ~~LOC~~ SOLN
20.0000 [IU] | Freq: Once | SUBCUTANEOUS | Status: AC
  Administered 2021-04-04: 20 [IU] via SUBCUTANEOUS
  Filled 2021-04-04: qty 0.2

## 2021-04-04 NOTE — Progress Notes (Signed)
NEW ADMISSION NOTE New Admission Note:   Arrival Method: ED stretcher Mental Orientation: AAOx4 Telemetry: 5M20 Assessment: Completed Skin: Intact IV: RAC Pain: 0/10 Tubes: n/a Safety Measures: Safety Fall Prevention Plan has been given, discussed and signed Admission: Completed [facility] Orientation: Patient has been orientated to the room, unit and staff.  Family: none at bedside  Orders have been reviewed and implemented. Will continue to monitor the patient. Call light has been placed within reach and bed alarm has been activated.   Myrtis Hopping, RN

## 2021-04-04 NOTE — Progress Notes (Signed)
[facility] PROGRESS NOTE   Richard Mendoza RNH:[id number] DOB: March 04, 1976 DOA: 04/03/2021  PCP: Barbette Reichmann, MD  Brief History/Interval Summary: 45 y.o. male with medical history significant for insulin-dependent diabetes mellitus, hypertension, chronic pain, depression, anxiety, and mild renal insufficiency, presented to the emergency department for evaluation of lightheadedness on standing, neck pain, and transient loss of consciousness.    Patient was found to have acute kidney injury.  He was also noted to be hypotensive.  He had orthostatic drop in blood pressure.  Patient was hospitalized for further management.    Reason for Visit: Acute kidney injury  Consultants: Nephrology  Procedures: None yet  Antibiotics: Anti-infectives (From admission, onward)   None      Subjective/Interval History: Patient denies any chest pain or dizziness while lying in the bed.  Denies any nausea vomiting or diarrhea.  Denies being on any new medications in the last 2 to 3 weeks.  His lower extremity amputation was several months ago.  Does have a history of high blood pressure and has been on antihypertensives with no changes in the last several weeks.    Assessment/Plan:  Acute kidney injury superimposed on chronic kidney disease stage II Patient was found to have a creatinine of 5.02.  Baseline is around 1.2.  No obstructive changes noted on CT renal study.  Nephrology was consulted.  Etiology likely hypovolemia in the setting of use of ACE inhibitor.  Patient being aggressively hydrated.  Slight improvement in renal function noted.  Patient is making urine.  UA reviewed.  Further management per nephrology.  Syncope/hypotension/orthostatic hypotension Patient noted to be hypotensive overnight.  Not particularly symptomatic.  His antihypertensives are on hold.  Reason for syncope is likely hypovolemia.  No evidence for sepsis currently.  He is getting IV fluids.  Patient denies  any chest pain.  Echocardiogram is pending.  Check cortisol level.  Concern for seizure activity/neck pain Apparently when he lost his consciousness his son witnessed seizure-like activity.  No known history of seizure disorder.  This was likely due to syncope itself. CT head did not show any acute findings in the intracranial region.  EEG has been ordered.  Did not have any significant electrolyte abnormalities. No focal neurological deficits noted.  Imaging studies of the neck was nonacute.  Moving all his extremities.  No indication for antiepileptics at this time.  Diabetes mellitus, insulin-dependent Tells me that he uses insulin pump at baseline.  Currently just on sliding scale.  We will initiate basal coverage.  HbA1c noted to be 12.3.  Followed by endocrinology with [facility].  History of depression anxiety chronic pain Continue home medications.  Obstructive sleep apnea CPAP  Peripheral vascular disease status post left below-knee amputation This was in December 2021.  Stump seems to be stable.  Obesity Estimated body mass index is 31.57 kg/m as calculated from the following:   Height as of this encounter: 5\' 10"  (1.778 m).   Weight as of this encounter: 99.8 kg.   DVT Prophylaxis: Subcutaneous heparin Code Status: Full code Family Communication: Discussed with the patient. no family at bedside Disposition Plan: Hopefully return home when improved  Status is: Inpatient  Remains inpatient appropriate because:Hemodynamically unstable, IV treatments appropriate due to intensity of illness or inability to take PO and Inpatient level of care appropriate due to severity of illness   Dispo: The patient is from: Home              Anticipated d/c is  to: Home              Patient currently is not medically stable to d/c.   Difficult to place patient No     Medications:  Scheduled: . atorvastatin  40 mg Oral Daily  . busPIRone  10 mg Oral BID  .  dicyclomine  10 mg Oral BID  . famotidine  10 mg Oral Daily  . gabapentin  200 mg Oral Daily  . heparin  5,000 Units Subcutaneous Q8H  . insulin aspart  0-5 Units Subcutaneous QHS  . insulin aspart  0-6 Units Subcutaneous TID WC  . pantoprazole  40 mg Oral Daily  . sertraline  100 mg Oral Daily  . sodium chloride flush  3 mL Intravenous Q12H   Continuous:  NWG:[id number] **OR** acetaminophen, HYDROmorphone, linaclotide, methocarbamol, ondansetron **OR** ondansetron (ZOFRAN) IV, senna   Objective:  Vital Signs  Vitals:   04/04/21 0600 04/04/21 0630 04/04/21 0645 04/04/21 0700  BP: (!) 88/39 108/73 97/65 (!) 97/57  Pulse: 81 83 79 77  Resp: 13 (!) 21 (!) 21 16  Temp:      TempSrc:      SpO2: 97% 94% 96% 98%  Weight:      Height:        Intake/Output Summary (Last 24 hours) at 04/04/2021 0814 Last data filed at 04/04/2021 0224 Gross per 24 hour  Intake 500 ml  Output --  Net 500 ml   Filed Weights   04/03/21 1844  Weight: 99.8 kg    General appearance: Awake alert.  In no distress Resp: Clear to auscultation bilaterally.  Normal effort Cardio: S1-S2 is normal regular.  No S3-S4.  No rubs murmurs or bruit GI: Abdomen is soft.  Nontender nondistended.  Bowel sounds are present normal.  No masses organomegaly Extremities: Status post left below-knee amputation.  No edema noted. Neurologic: Alert and oriented x3.  No focal neurological deficits.    Lab Results:  Data Reviewed: I have personally reviewed following labs and imaging studies  CBC: Recent Labs  Lab 04/03/21 1849 04/04/21 0301  WBC 9.7 7.4  NEUTROABS 7.2  --   HGB 12.3* 10.7*  HCT 37.9* 33.9*  MCV 82.8 84.8  PLT 277 213    Basic Metabolic Panel: Recent Labs  Lab 04/03/21 1849 04/04/21 0301  NA 132* 137  K 3.8 3.5  CL 98 108  CO2 19* 21*  GLUCOSE 277* 220*  BUN 53* 47*  CREATININE 5.02* 4.11*  CALCIUM 8.9 8.3*  MG 2.6*  --     GFR: Estimated Creatinine Clearance: 26.9 mL/min (A)  (by C-G formula based on SCr of 4.11 mg/dL (H)).  Liver Function Tests: Recent Labs  Lab 04/03/21 2110  AST 25  ALT 18  ALKPHOS 146*  BILITOT 0.3  PROT 7.2  ALBUMIN 3.7     HbA1C: Recent Labs    04/04/21 0301  HGBA1C 12.3*    CBG: Recent Labs  Lab 04/03/21 2331 04/04/21 0520 04/04/21 0759  GLUCAP 214* 226* 230*      Recent Results (from the past 240 hour(s))  SARS CORONAVIRUS 2 (TAT 6-24 HRS)     Status: None   Collection Time: 04/03/21 11:54 PM  Result Value Ref Range Status   SARS Coronavirus 2 NEGATIVE NEGATIVE Final    Comment: (NOTE) SARS-CoV-2 target nucleic acids are NOT DETECTED.  The SARS-CoV-2 RNA is generally detectable in upper and lower respiratory specimens during the acute phase of infection. Negative results do not preclude  SARS-CoV-2 infection, do not rule out co-infections with other pathogens, and should not be used as the sole basis for treatment or other patient management decisions. Negative results must be combined with clinical observations, patient history, and epidemiological information. The expected result is Negative.  Fact Sheet for Patients: HairSlick.no  Fact Sheet for Healthcare Providers: quierodirigir.com  This test is not yet approved or cleared by the [place] FDA and  has been authorized for detection and/or diagnosis of SARS-CoV-2 by FDA under an Emergency Use Authorization (EUA). This EUA will remain  in effect (meaning this test can be used) for the duration of the COVID-19 declaration under Se ction 564(b)(1) of the Act, 21 U.S.C. section 360bbb-3(b)(1), unless the authorization is terminated or revoked sooner.  Performed at [facility], [address] [place], [place] [address]       Radiology Studies: DG Neck Soft Tissue  Result Date: 04/04/2021 CLINICAL DATA:  Neck pain EXAM: NECK SOFT TISSUES - 1+ VIEW COMPARISON:  None. FINDINGS:  There is no evidence of retropharyngeal soft tissue swelling or epiglottic enlargement. The cervical airway is unremarkable and no radio-opaque foreign body identified. C6-7 anterior cervical discectomy and fusion with instrumentation has been performed. IMPRESSION: Negative. Electronically Signed   By: Helyn Numbers MD   On: 04/04/2021 00:30   CT Head Wo Contrast  Result Date: 04/03/2021 CLINICAL DATA:  Dizziness with fall and trauma to left-sided head a EXAM: CT HEAD WITHOUT CONTRAST TECHNIQUE: Contiguous axial images were obtained from the base of the skull through the vertex without intravenous contrast. COMPARISON:  None. FINDINGS: Brain: No evidence of acute infarction, hemorrhage, hydrocephalus, extra-axial collection or mass lesion/mass effect. Vascular: No hyperdense vessel or unexpected calcification. Skull: Normal. Negative for fracture or focal lesion. Sinuses/Orbits: The paranasal sinuses and mastoid air cells are predominantly clear. Orbits are grossly unremarkable. Other: Soft tissue density subcutaneous nodules measuring 8 mm at the right vertex, and 1.3 cm at the left occiput. IMPRESSION: 1. No acute intracranial abnormality. 2. Soft tissue density subcutaneous nodules measuring 8 mm at the right vertex, and 1.3 cm at the left occiput. These are nonspecific and may represent sebaceous cysts. Correlate with physical exam. Electronically Signed   By: Maudry Mayhew MD   On: 04/03/2021 21:39   DG Knee Complete 4 Views Right  Result Date: 04/03/2021 CLINICAL DATA:  Knee pain after fall from standing EXAM: RIGHT KNEE - COMPLETE 4+ VIEW COMPARISON:  May 20, 2018 FINDINGS: No evidence of fracture, dislocation, or joint effusion. Well corticated osseous irregularity along the lateral distal femoral condyle likely sequela prior injury. Superior and inferior patellar enthesophytes within an enthesophyte in the patellar tendon at its attachment to the tibia, unchanged from prior. Soft tissues are  unremarkable. IMPRESSION: 1. No acute fracture or dislocation. No joint effusion. 2. Well corticated osseous irregularity along the lateral distal femoral condyle likely sequela prior injury. Electronically Signed   By: Maudry Mayhew MD   On: 04/03/2021 20:28   CT Renal Stone Study  Result Date: 04/03/2021 CLINICAL DATA:  Flank pain EXAM: CT ABDOMEN AND PELVIS WITHOUT CONTRAST TECHNIQUE: Multidetector CT imaging of the abdomen and pelvis was performed following the standard protocol without IV contrast. COMPARISON:  10/20/2017 FINDINGS: Lower chest: No acute abnormality. Hepatobiliary: No focal liver abnormality is seen. No gallstones, gallbladder wall thickening, or biliary dilatation. Pancreas: Unremarkable. No pancreatic ductal dilatation or surrounding inflammatory changes. Spleen: Normal in size without focal abnormality. Adrenals/Urinary Tract: Adrenal glands are within normal  limits. The kidneys demonstrate no evidence of renal calculi. The ureters are within normal limits bilaterally. The bladder is partially distended. Stomach/Bowel: The appendix has been surgically removed. No obstructive or inflammatory changes of the colon are seen. Small bowel and stomach are within normal limits. Vascular/Lymphatic: No significant vascular findings are present. No enlarged abdominal or pelvic lymph nodes. Left retroaortic renal vein is noted. Reproductive: Prostate is unremarkable. Other: No abdominal wall hernia or abnormality. No abdominopelvic ascites. Musculoskeletal: No acute bony abnormality is noted. IMPRESSION: No renal calculi or obstructive changes are noted. No acute abnormality noted. Electronically Signed   By: Alcide CleverMark  Lukens M.D.   On: 04/03/2021 21:09       LOS: 1 day   Osvaldo ShipperGokul Adrain Butrick  [facility] Pager on [email or web address]  04/04/2021, 8:14 AM

## 2021-04-04 NOTE — ED Notes (Signed)
Pts noted to be hypotensive. Mentation in tact. No accompanying symptoms. MD made aware. New orders acknowledge and started.

## 2021-04-04 NOTE — Plan of Care (Signed)

## 2021-04-04 NOTE — Progress Notes (Addendum)
Inpatient Diabetes Program Recommendations  AACE/ADA: New Consensus Statement on Inpatient Glycemic Control (2015)  Target Ranges:  Prepandial:   less than 140 mg/dL      Peak postprandial:   less than 180 mg/dL (1-2 hours)      Critically ill patients:  140 - 180 mg/dL   Lab Results  Component Value Date   GLUCAP 230 (H) 04/04/2021   HGBA1C 12.3 (H) 04/04/2021    Review of Glycemic Control  Diabetes history: DM 2 Outpatient Diabetes medications:   Endocrinologist: Rudi Heap, FNP with [facility]--last seen 08/31/2020, Fiasp insulin used in insulin pump Basal rates= 3.6 units/hr Total Basal per 24 hour period= 86.4 units Carbohydrate Ratio= 1 unit for every 4 grams Carbohydrates Correction Factor= 1 unit for every 20 mg/dl above Target CBG  Current orders for Inpatient glycemic control:  Levemir 15 units bid Novolog 0-6 units tid + hs  A1c 12.3% this admission Pt uncontrolled on SQ insulin A1c between 12-14% all the time Feels shaky at 80 mg/dl  Inpatient Diabetes Program Recommendations:    Note: pt reports wife bringing in pump supplies today.  -  Change to insulin pump order set when pt's wife brings in insulin pump supplies, d/c all other SQ insulin orders.   Spoke with pt at bedside regarding A1c level and glucose control at home. Pt reports being on a medtronic 630G insulin pump with a dexcom com sensor. The transmitter of the sensor is missing so pt has been doing finger sticks for glucose monitoring instead of his CGM. Pt reports his target is 100-115, and he feels he has been doing well with glucose control since his amputation cutting out sweetened beverages and sweets. Pt still eats fruit occasionally. Discussed A1c level. Pt reports being surprised at the level. Pt also says that in the mornings his glucose levels are high but come down as the day goes on. Pt not aware if he has dawn phenomenon effect. Discussed pt to get an appt with his  Endocrinologist or do a televisit for glucose control. Pt has not seen his Endocrinologist since October last year.  Thanks,  Richard Deem RN, MSN, BC-ADM Inpatient Diabetes Coordinator Team Pager [phone] (8a-5p)

## 2021-04-04 NOTE — Progress Notes (Signed)
Patient developed asymptomatic hypotension. MAP remained <65 after 500 cc NS bolus. Plan to give 1 liter NS bolus, change level of care to progressive.

## 2021-04-04 NOTE — ED Notes (Signed)
I tried to call report to floor, Nurse to call me back

## 2021-04-04 NOTE — ED Notes (Addendum)
MD notified of BP. Mentation in tact and no accompanying symptoms. New orders acknowledged and started.

## 2021-04-04 NOTE — Progress Notes (Incomplete)
  Echocardiogram 2D Echocardiogram has been performed.  Flavius Repsher  Marthann Schiller 04/04/2021, 1:51 PM

## 2021-04-04 NOTE — ED Notes (Signed)
To M20 via stretcher, no complaints

## 2021-04-04 NOTE — ED Notes (Signed)
Placed Breakfast order 

## 2021-04-04 NOTE — Procedures (Signed)
Patient Name: Richard Mendoza  MRN: [id number]  Epilepsy Attending: Charlsie Quest  Referring Physician/Provider: Dr Odie Sera Date: 04/04/2021 Duration: 36.02 mins  Patient history: 45 year old male with syncope and seizure-like activity.  EEG to evaluate for seizures.  Level of alertness: Awake, asleep  AEDs during EEG study: Gabapentin  Technical aspects: This EEG study was done with scalp electrodes positioned according to the 10-20 International system of electrode placement. Electrical activity was acquired at a sampling rate of 500Hz  and reviewed with a high frequency filter of 70Hz  and a low frequency filter of 1Hz . EEG data were recorded continuously and digitally stored.   Description: The posterior dominant rhythm consists of 8 Hz activity of moderate voltage (25-35 uV) seen predominantly in posterior head regions, symmetric and reactive to eye opening and eye closing. Sleep was characterized by vertex waves, sleep spindles (12 to 14 Hz), maximal frontocentral region.  Hyperventilation and photic stimulation were not performed.     IMPRESSION: This study is within normal limits. No seizures or epileptiform discharges were seen throughout the recording.  Richard Mendoza 

## 2021-04-04 NOTE — Progress Notes (Signed)
EEG complete - results pending 

## 2021-04-04 NOTE — Progress Notes (Signed)
Pt stated he does have a CPAP at home and doesn't wear it.  Pt stated he doesn't want to wear CPAP and will wear 02 as needed.

## 2021-04-04 NOTE — ED Notes (Signed)
Pt placed on 2L of O2 due to o2 sats dropping into high 80's during sleep.

## 2021-04-05 LAB — BASIC METABOLIC PANEL
Anion gap: 6 (ref 5–15)
BUN: 29 mg/dL — ABNORMAL HIGH (ref 6–20)
CO2: 24 mmol/L (ref 22–32)
Calcium: 9 mg/dL (ref 8.9–10.3)
Chloride: 106 mmol/L (ref 98–111)
Creatinine, Ser: 2.22 mg/dL — ABNORMAL HIGH (ref 0.61–1.24)
GFR, Estimated: 36 mL/min — ABNORMAL LOW (ref >60)
Glucose, Bld: 263 mg/dL — ABNORMAL HIGH (ref 70–99)
Potassium: 4 mmol/L (ref 3.5–5.1)
Sodium: 136 mmol/L (ref 135–145)

## 2021-04-05 LAB — CBC
HCT: 33.4 % — ABNORMAL LOW (ref 39.0–52.0)
Hemoglobin: 10.7 g/dL — ABNORMAL LOW (ref 13.0–17.0)
MCH: 26.7 pg (ref 26.0–34.0)
MCHC: 32 g/dL (ref 30.0–36.0)
MCV: 83.3 fL (ref 80.0–100.0)
Platelets: 219 10*3/uL (ref 150–400)
RBC: 4.01 MIL/uL — ABNORMAL LOW (ref 4.22–5.81)
RDW: 15.4 % (ref 11.5–15.5)
WBC: 6.3 10*3/uL (ref 4.0–10.5)
nRBC: 0 % (ref 0.0–0.2)

## 2021-04-05 LAB — GLUCOSE, CAPILLARY
Glucose-Capillary: 219 mg/dL — ABNORMAL HIGH (ref 70–99)
Glucose-Capillary: 339 mg/dL — ABNORMAL HIGH (ref 70–99)
Glucose-Capillary: 259 mg/dL — ABNORMAL HIGH (ref 70–99)
Glucose-Capillary: 343 mg/dL — ABNORMAL HIGH (ref 70–99)

## 2021-04-05 MED ORDER — INSULIN ASPART 100 UNIT/ML IJ SOLN
0.0000 [IU] | Freq: Three times a day (TID) | INTRAMUSCULAR | Status: DC
  Administered 2021-04-05: 11 [IU] via SUBCUTANEOUS
  Administered 2021-04-05 – 2021-04-06 (×3): 8 [IU] via SUBCUTANEOUS

## 2021-04-05 MED ORDER — INSULIN ASPART 100 UNIT/ML IJ SOLN: 0.0000 [IU] | Freq: Three times a day (TID) | INTRAMUSCULAR | Status: DC

## 2021-04-05 MED ORDER — INSULIN ASPART 100 UNIT/ML IJ SOLN: 0.0000 [IU] | Freq: Every day | INTRAMUSCULAR | Status: DC

## 2021-04-05 MED ORDER — INSULIN ASPART 100 UNIT/ML IJ SOLN
4.0000 [IU] | Freq: Three times a day (TID) | INTRAMUSCULAR | Status: DC
  Administered 2021-04-05 – 2021-04-06 (×4): 4 [IU] via SUBCUTANEOUS

## 2021-04-05 MED ORDER — INSULIN ASPART 100 UNIT/ML IJ SOLN: 3.0000 [IU] | Freq: Three times a day (TID) | INTRAMUSCULAR | Status: DC

## 2021-04-05 MED ORDER — INSULIN ASPART 100 UNIT/ML IJ SOLN
0.0000 [IU] | Freq: Every day | INTRAMUSCULAR | Status: DC
  Administered 2021-04-05: 4 [IU] via SUBCUTANEOUS

## 2021-04-05 NOTE — Plan of Care (Signed)

## 2021-04-05 NOTE — Progress Notes (Signed)
[facility] PROGRESS NOTE    Progress Note  Richard Mendoza  JQB:[id number] DOB: 11/09/1976 DOA: 04/03/2021 PCP: Barbette Reichmann, MD     Brief Narrative:   Richard Mendoza is an 45 y.o. male past medical history significant for insulin-dependent diabetes mellitus, essential hypertension, depression anxiety chronic renal insufficiency comes into the ED for lightheadedness on standing and transient loss of consciousness.  In the ED was found to be in acute kidney injury and hypotensive.  Orthostatics were positive.    Assessment/Plan:  Acute renal failure superimposed on stage 2 chronic kidney disease (HCC) Patient baseline creatinine 1 on admission of 5 CT renal study showed no acute findings. Nephrology was consulted etiology likely hypovolemia in the setting of ACE inhibitor he was aggressively fluid resuscitated. Creatinine is slowly improving further management per nephrology.  Syncope due to orthostatic hypotension: All of his antihypertensive medications were held on admission. Sepsis has been ruled out.  Tonic-clonic movement as per family: Apparently when he lost consciousness there was seizure-like activity no known history of seizures.  No significant electrolyte imbalance. EEG showed no seizure events.  Likely due to syncopal episode.  Insulin-dependent diabetes mellitus type  II: With an A1c of 12.3.  Changes sliding scale insulin as his kidney function improved his insulin requirement will increase. Continue long-acting insulin.  OBSTRUCTIVE SLEEP APNEA CPAP at night.  Peripheral vascular disease: Stump is clean and stable.   DVT prophylaxis: Lovenox Family Communication:None Status is: Inpatient  Remains inpatient appropriate because:Hemodynamically unstable   Dispo:  Patient From: Home  Planned Disposition: Home  Medically stable for discharge: No          Code Status:     Code Status Orders  (From admission, onward)         Start      Ordered   04/03/21 2255  Full code  Continuous        04/03/21 2258        Code Status History    Date Active Date Inactive Code Status Order ID Comments User Context   11/07/2020 1924 11/22/2020 0313 Full Code [id number]  Hannah Beat, MD ED   05/15/2019 1200 05/15/2019 1547 Full Code [id number]  Gwyneth Revels, DPM Inpatient   03/05/2019 1349 03/10/2019 1651 Full Code [id number]  Campbell Stall, MD Inpatient   11/25/2018 1606 11/30/2018 0010 Full Code [id number]  Ihor Austin, MD Inpatient   10/21/2017 2123 10/22/2017 2032 Full Code [id number]  Marcelo Baldy, MD ED   09/09/2017 2157 09/10/2017 1700 Full Code [id number]  Coletta Memos, MD Inpatient   Advance Care Planning Activity        IV Access:    Peripheral IV   Procedures and diagnostic studies:   DG Neck Soft Tissue  Result Date: 04/04/2021 CLINICAL DATA:  Neck pain EXAM: NECK SOFT TISSUES - 1+ VIEW COMPARISON:  None. FINDINGS: There is no evidence of retropharyngeal soft tissue swelling or epiglottic enlargement. The cervical airway is unremarkable and no radio-opaque foreign body identified. C6-7 anterior cervical discectomy and fusion with instrumentation has been performed. IMPRESSION: Negative. Electronically Signed   By: Helyn Numbers MD   On: 04/04/2021 00:30   CT Head Wo Contrast  Result Date: 04/03/2021 CLINICAL DATA:  Dizziness with fall and trauma to left-sided head a EXAM: CT HEAD WITHOUT CONTRAST TECHNIQUE: Contiguous axial images were obtained from the base of the skull through the vertex without intravenous contrast. COMPARISON:  None. FINDINGS: Brain: No evidence of acute infarction, hemorrhage, hydrocephalus, extra-axial  collection or mass lesion/mass effect. Vascular: No hyperdense vessel or unexpected calcification. Skull: Normal. Negative for fracture or focal lesion. Sinuses/Orbits: The paranasal sinuses and mastoid air cells are predominantly clear. Orbits are grossly unremarkable. Other: Soft tissue  density subcutaneous nodules measuring 8 mm at the right vertex, and 1.3 cm at the left occiput. IMPRESSION: 1. No acute intracranial abnormality. 2. Soft tissue density subcutaneous nodules measuring 8 mm at the right vertex, and 1.3 cm at the left occiput. These are nonspecific and may represent sebaceous cysts. Correlate with physical exam. Electronically Signed   By: Maudry Mayhew MD   On: 04/03/2021 21:39   DG Knee Complete 4 Views Right  Result Date: 04/03/2021 CLINICAL DATA:  Knee pain after fall from standing EXAM: RIGHT KNEE - COMPLETE 4+ VIEW COMPARISON:  May 20, 2018 FINDINGS: No evidence of fracture, dislocation, or joint effusion. Well corticated osseous irregularity along the lateral distal femoral condyle likely sequela prior injury. Superior and inferior patellar enthesophytes within an enthesophyte in the patellar tendon at its attachment to the tibia, unchanged from prior. Soft tissues are unremarkable. IMPRESSION: 1. No acute fracture or dislocation. No joint effusion. 2. Well corticated osseous irregularity along the lateral distal femoral condyle likely sequela prior injury. Electronically Signed   By: Maudry Mayhew MD   On: 04/03/2021 20:28   EEG adult  Result Date: 04/04/2021 Charlsie Quest, MD     04/04/2021  3:13 PM Patient Name: Richard Mendoza MRN: [id number] Epilepsy Attending: Charlsie Quest Referring Physician/Provider: Dr Odie Sera Date: 04/04/2021 Duration: 36.02 mins Patient history: 45 year old male with syncope and seizure-like activity.  EEG to evaluate for seizures. Level of alertness: Awake, asleep AEDs during EEG study: Gabapentin Technical aspects: This EEG study was done with scalp electrodes positioned according to the 10-20 International system of electrode placement. Electrical activity was acquired at a sampling rate of  and reviewed with a high frequency filter of  and a low frequency filter of . EEG data were recorded continuously and digitally  stored. Description: The posterior dominant rhythm consists of 8 Hz activity of moderate voltage (25-35 uV) seen predominantly in posterior head regions, symmetric and reactive to eye opening and eye closing. Sleep was characterized by vertex waves, sleep spindles (12 to 14 Hz), maximal frontocentral region.  Hyperventilation and photic stimulation were not performed.   IMPRESSION: This study is within normal limits. No seizures or epileptiform discharges were seen throughout the recording. Charlsie Quest   ECHOCARDIOGRAM COMPLETE  Result Date: 04/04/2021    ECHOCARDIOGRAM REPORT   Patient Name:   Richard Mendoza Date of Exam: 04/04/2021 Medical Rec #:  [id number]      Height:       70.0 in Accession #:    [id number]     Weight:       220.0 lb Date of Birth:  1976/06/10      BSA:          2.174 m Patient Age:    45 years       BP:           97/57 mmHg Patient Gender: M              HR:           77 bpm. Exam Location:  Inpatient Procedure: 2D Echo, Cardiac Doppler and Color Doppler Indications:    Syncope  History:        Patient has no prior history of Echocardiogram examinations.  Risk Factors:Hypertension and Diabetes.  Sonographer:    Shirlean KellyJohn Mendel Brown Referring Phys: [id number] TIMOTHY S OPYD IMPRESSIONS  1. Left ventricular ejection fraction, by estimation, is 55 to 60%. The left ventricle has normal function. The left ventricle has no regional wall motion abnormalities. There is mild concentric left ventricular hypertrophy. Left ventricular diastolic parameters were normal.  2. Right ventricular systolic function is normal. The right ventricular size is normal.  3. The mitral valve is normal in structure. No evidence of mitral valve regurgitation.  4. The aortic valve is tricuspid. Aortic valve regurgitation is not visualized. No aortic stenosis is present.  5. The inferior vena cava is normal in size with greater than 50% respiratory variability, suggesting right atrial pressure of 3 mmHg.  Comparison(s): No prior Echocardiogram. Conclusion(s)/Recommendation(s): Otherwise normal echocardiogram, with minor abnormalities described in the report. FINDINGS  Left Ventricle: Left ventricular ejection fraction, by estimation, is 55 to 60%. The left ventricle has normal function. The left ventricle has no regional wall motion abnormalities. The left ventricular internal cavity size was normal in size. There is  mild concentric left ventricular hypertrophy. Left ventricular diastolic parameters were normal. Right Ventricle: The right ventricular size is normal. No increase in right ventricular wall thickness. Right ventricular systolic function is normal. Left Atrium: Left atrial size was normal in size. Right Atrium: Right atrial size was normal in size. Pericardium: There is no evidence of pericardial effusion. Mitral Valve: The mitral valve is normal in structure. No evidence of mitral valve regurgitation. Tricuspid Valve: The tricuspid valve is normal in structure. Tricuspid valve regurgitation is not demonstrated. No evidence of tricuspid stenosis. Aortic Valve: The aortic valve is tricuspid. Aortic valve regurgitation is not visualized. No aortic stenosis is present. Aortic valve mean gradient measures 6.0 mmHg. Aortic valve peak gradient measures 9.7 mmHg. Aortic valve area, by VTI measures 3.66 cm. Pulmonic Valve: The pulmonic valve was normal in structure. Pulmonic valve regurgitation is not visualized. No evidence of pulmonic stenosis. Aorta: The aortic root and ascending aorta are structurally normal, with no evidence of dilitation. Venous: The inferior vena cava is normal in size with greater than 50% respiratory variability, suggesting right atrial pressure of 3 mmHg. IAS/Shunts: The atrial septum is grossly normal.  LEFT VENTRICLE PLAX 2D LVIDd:         4.60 cm  Diastology LVIDs:         3.10 cm  LV e' medial:    9.90 cm/s LV PW:         1.20 cm  LV E/e' medial:  9.3 LV IVS:        1.20 cm  LV e'  lateral:   14.60 cm/s LVOT diam:     2.40 cm  LV E/e' lateral: 6.3 LV SV:         97 LV SV Index:   45 LVOT Area:     4.52 cm  RIGHT VENTRICLE             IVC RV Basal diam:  4.00 cm     IVC diam: 1.00 cm RV S prime:     19.70 cm/s TAPSE (M-mode): 3.1 cm LEFT ATRIUM             Index       RIGHT ATRIUM           Index LA diam:        3.10 cm 1.43 cm/m  RA Area:     16.10 cm LA Vol (A2C):  45.0 ml 20.70 ml/m RA Volume:   37.30 ml  17.16 ml/m LA Vol (A4C):   53.4 ml 24.57 ml/m LA Biplane Vol: 52.9 ml 24.34 ml/m  AORTIC VALVE                    PULMONIC VALVE AV Area (Vmax):    4.06 cm     PV Vmax:       1.52 m/s AV Area (Vmean):   3.55 cm     PV Peak grad:  9.2 mmHg AV Area (VTI):     3.66 cm AV Vmax:           156.00 cm/s AV Vmean:          113.000 cm/s AV VTI:            0.266 m AV Peak Grad:      9.7 mmHg AV Mean Grad:      6.0 mmHg LVOT Vmax:         140.00 cm/s LVOT Vmean:        88.600 cm/s LVOT VTI:          0.215 m LVOT/AV VTI ratio: 0.81  AORTA Ao Root diam: 3.40 cm Ao Asc diam:  3.50 cm MITRAL VALVE MV Area (PHT): 4.31 cm    SHUNTS MV Decel Time: 176 msec    Systemic VTI:  0.22 m MV E velocity: 91.90 cm/s  Systemic Diam: 2.40 cm MV A velocity: 93.50 cm/s MV E/A ratio:  0.98 Riley Lam MD Electronically signed by Riley Lam MD Signature Date/Time: 04/04/2021/2:27:26 PM    Final    CT Renal Stone Study  Result Date: 04/03/2021 CLINICAL DATA:  Flank pain EXAM: CT ABDOMEN AND PELVIS WITHOUT CONTRAST TECHNIQUE: Multidetector CT imaging of the abdomen and pelvis was performed following the standard protocol without IV contrast. COMPARISON:  10/20/2017 FINDINGS: Lower chest: No acute abnormality. Hepatobiliary: No focal liver abnormality is seen. No gallstones, gallbladder wall thickening, or biliary dilatation. Pancreas: Unremarkable. No pancreatic ductal dilatation or surrounding inflammatory changes. Spleen: Normal in size without focal abnormality. Adrenals/Urinary Tract:  Adrenal glands are within normal limits. The kidneys demonstrate no evidence of renal calculi. The ureters are within normal limits bilaterally. The bladder is partially distended. Stomach/Bowel: The appendix has been surgically removed. No obstructive or inflammatory changes of the colon are seen. Small bowel and stomach are within normal limits. Vascular/Lymphatic: No significant vascular findings are present. No enlarged abdominal or pelvic lymph nodes. Left retroaortic renal vein is noted. Reproductive: Prostate is unremarkable. Other: No abdominal wall hernia or abnormality. No abdominopelvic ascites. Musculoskeletal: No acute bony abnormality is noted. IMPRESSION: No renal calculi or obstructive changes are noted. No acute abnormality noted. Electronically Signed   By: Alcide Clever M.D.   On: 04/03/2021 21:09     Medical Consultants:    None.   Subjective:    Richard Mendoza relates he feels better today tolerating his diet.  Objective:    Vitals:   04/04/21 1203 04/04/21 1640 04/04/21 2023 04/05/21 0505  BP: 125/76 (!) 141/88 (!) 163/91 135/85  Pulse: 92 91 84 68  Resp: Temp: 97.7 F (36.5 C) 97.8 F (36.6 C) 98.1 F (36.7 C) 97.6 F (36.4 C)  TempSrc: Oral Oral Oral Oral  SpO2: 99% 98% 98% 98%  Weight:      Height:       SpO2: 98 %   Intake/Output Summary (Last 24 hours) at 04/05/2021 1151  Last data filed at 04/05/2021 1039 Gross per 24 hour  Intake 2377.42 ml  Output 3375 ml  Net -997.58 ml   Filed Weights   04/03/21 1844  Weight: 99.8 kg    Exam: General exam: In no acute distress. Respiratory system: Good air movement and clear to auscultation. Cardiovascular system: S1 & S2 heard, RRR. No JVD. Gastrointestinal system: Abdomen is nondistended, soft and nontender.  Extremities: No pedal edema. Skin: No rashes, lesions or ulcers Psychiatry: Judgement and insight appear normal. Mood & affect appropriate.    Data Reviewed:    Labs: Basic  Metabolic Panel: Recent Labs  Lab 04/03/21 1849 04/04/21 0301 04/05/21 0410  NA 132* 137 136  K 3.8 3.5 4.0  CL 98 108 106  CO2 19* 21* 24  GLUCOSE 277* 220* 263*  BUN 53* 47* 29*  CREATININE 5.02* 4.11* 2.22*  CALCIUM 8.9 8.3* 9.0  MG 2.6*  --   --    GFR Estimated Creatinine Clearance: 49.7 mL/min (A) (by C-G formula based on SCr of 2.22 mg/dL (H)). Liver Function Tests: Recent Labs  Lab 04/03/21 2110  AST 25  ALT 18  ALKPHOS 146*  BILITOT 0.3  PROT 7.2  ALBUMIN 3.7   No results for input(s): LIPASE, AMYLASE in the last 168 hours. No results for input(s): AMMONIA in the last 168 hours. Coagulation profile No results for input(s): INR, PROTIME in the last 168 hours. COVID-19 Labs  No results for input(s): DDIMER, FERRITIN, LDH, CRP in the last 72 hours.  Lab Results  Component Value Date   SARSCOV2NAA NEGATIVE 04/03/2021   SARSCOV2NAA NEGATIVE 11/07/2020   SARSCOV2NAA NOT DETECTED 05/12/2019    CBC: Recent Labs  Lab 04/03/21 1849 04/04/21 0301 04/05/21 0410  WBC 9.7 7.4 6.3  NEUTROABS 7.2  --   --   HGB 12.3* 10.7* 10.7*  HCT 37.9* 33.9* 33.4*  MCV 82.8 84.8 83.3  PLT 277 213 219   Cardiac Enzymes: No results for input(s): CKTOTAL, CKMB, CKMBINDEX, TROPONINI in the last 168 hours. BNP (last 3 results) No results for input(s): PROBNP in the last 8760 hours. CBG: Recent Labs  Lab 04/04/21 1202 04/04/21 1455 04/04/21 1639 04/04/21 2047 04/05/21 0624  GLUCAP 292* 304* 299* 268* 219*   D-Dimer: No results for input(s): DDIMER in the last 72 hours. Hgb A1c: Recent Labs    04/04/21 0301  HGBA1C 12.3*   Lipid Profile: No results for input(s): CHOL, HDL, LDLCALC, TRIG, CHOLHDL, LDLDIRECT in the last 72 hours. Thyroid function studies: No results for input(s): TSH, T4TOTAL, T3FREE, THYROIDAB in the last 72 hours.  Invalid input(s): FREET3 Anemia work up: No results for input(s): VITAMINB12, FOLATE, FERRITIN, TIBC, IRON, RETICCTPCT in the  last 72 hours. Sepsis Labs: Recent Labs  Lab 04/03/21 1849 04/04/21 0301 04/05/21 0410  WBC 9.7 7.4 6.3   Microbiology Recent Results (from the past 240 hour(s))  SARS CORONAVIRUS 2 (TAT 6-24 HRS)     Status: None   Collection Time: 04/03/21 11:54 PM  Result Value Ref Range Status   SARS Coronavirus 2 NEGATIVE NEGATIVE Final    Comment: (NOTE) SARS-CoV-2 target nucleic acids are NOT DETECTED.  The SARS-CoV-2 RNA is generally detectable in upper and lower respiratory specimens during the acute phase of infection. Negative results do not preclude SARS-CoV-2 infection, do not rule out co-infections with other pathogens, and should not be used as the sole basis for treatment or other patient management decisions. Negative results must be combined with clinical observations, patient  history, and epidemiological information. The expected result is Negative.  Fact Sheet for Patients: HairSlick.no  Fact Sheet for Healthcare Providers: quierodirigir.com  This test is not yet approved or cleared by the [place] FDA and  has been authorized for detection and/or diagnosis of SARS-CoV-2 by FDA under an Emergency Use Authorization (EUA). This EUA will remain  in effect (meaning this test can be used) for the duration of the COVID-19 declaration under Se ction 564(b)(1) of the Act, 21 U.S.C. section 360bbb-3(b)(1), unless the authorization is terminated or revoked sooner.  Performed at [facility], [address], [place], [place] [address]      Medications:   . atorvastatin  40 mg Oral Daily  . busPIRone  10 mg Oral BID  . dicyclomine  10 mg Oral BID  . famotidine  10 mg Oral Daily  . gabapentin  200 mg Oral Daily  . heparin  5,000 Units Subcutaneous Q8H  . insulin aspart  0-5 Units Subcutaneous QHS  . insulin aspart  0-6 Units Subcutaneous TID WC  . insulin detemir  30 Units Subcutaneous BID  . pantoprazole   40 mg Oral Daily  . sertraline  100 mg Oral Daily  . sodium chloride flush  3 mL Intravenous Q12H  . traZODone  100 mg Oral QHS   Continuous Infusions:    LOS: 2 days   Marinda Elk  [facility]  04/05/2021, 11:51 AM

## 2021-04-05 NOTE — Plan of Care (Signed)
  Problem: Education: Goal: Knowledge of General Education information will improve Description: Including pain rating scale, medication(s)/side effects and non-pharmacologic comfort measures Outcome: Progressing   Problem: Health Behavior/Discharge Planning: Goal: Ability to manage health-related needs will improve Outcome: Progressing   Problem: Elimination: Goal: Will not experience complications related to bowel motility Outcome: Progressing Goal: Will not experience complications related to urinary retention Outcome: Progressing   Problem: Pain Managment: Goal: General experience of comfort will improve Outcome: Progressing   Problem: Safety: Goal: Ability to remain free from injury will improve Outcome: Progressing   Problem: Fluid Volume: Goal: Compliance with measures to maintain balanced fluid volume will improve Outcome: Progressing   Problem: Education: Goal: Knowledge of disease and its progression will improve Outcome: Progressing Goal: Individualized Educational Video(s) Outcome: Progressing

## 2021-04-06 LAB — GLUCOSE, CAPILLARY
Glucose-Capillary: 273 mg/dL — ABNORMAL HIGH (ref 70–99)
Glucose-Capillary: 281 mg/dL — ABNORMAL HIGH (ref 70–99)
Glucose-Capillary: 260 mg/dL — ABNORMAL HIGH (ref 70–99)

## 2021-04-06 LAB — CBC
HCT: 32.5 % — ABNORMAL LOW (ref 39.0–52.0)
Hemoglobin: 10.6 g/dL — ABNORMAL LOW (ref 13.0–17.0)
MCH: 27 pg (ref 26.0–34.0)
MCHC: 32.6 g/dL (ref 30.0–36.0)
MCV: 82.7 fL (ref 80.0–100.0)
Platelets: 196 10*3/uL (ref 150–400)
RBC: 3.93 MIL/uL — ABNORMAL LOW (ref 4.22–5.81)
RDW: 14.9 % (ref 11.5–15.5)
WBC: 6.6 10*3/uL (ref 4.0–10.5)
nRBC: 0 % (ref 0.0–0.2)

## 2021-04-06 LAB — BASIC METABOLIC PANEL
Anion gap: 8 (ref 5–15)
BUN: 21 mg/dL — ABNORMAL HIGH (ref 6–20)
CO2: 24 mmol/L (ref 22–32)
Calcium: 9.3 mg/dL (ref 8.9–10.3)
Chloride: 104 mmol/L (ref 98–111)
Creatinine, Ser: 1.79 mg/dL — ABNORMAL HIGH (ref 0.61–1.24)
GFR, Estimated: 47 mL/min — ABNORMAL LOW (ref >60)
Glucose, Bld: 295 mg/dL — ABNORMAL HIGH (ref 70–99)
Potassium: 3.9 mmol/L (ref 3.5–5.1)
Sodium: 136 mmol/L (ref 135–145)

## 2021-04-06 MED ORDER — AMLODIPINE BESY-BENAZEPRIL HCL 10-40 MG PO CAPS: 1.0000 | ORAL_CAPSULE | Freq: Every day | ORAL | Status: DC

## 2021-04-06 MED ORDER — LISINOPRIL 20 MG PO TABS: 20.0000 mg | ORAL_TABLET | Freq: Every day | ORAL | 0 refills | Status: DC

## 2021-04-06 MED ORDER — FUROSEMIDE 40 MG PO TABS: 40.0000 mg | ORAL_TABLET | Freq: Every day | ORAL | Status: DC

## 2021-04-06 NOTE — Progress Notes (Signed)
DISCHARGE NOTE HOME Richard Mendoza to be discharged to home per MD order. Discussed prescriptions and follow up appointments with the patient. Prescriptions given to patient; medication list explained in detail. Patient verbalized understanding.  Skin clean, dry and intact without evidence of skin break down, no evidence of skin tears noted. IV catheter discontinued intact. Site without signs and symptoms of complications. Dressing and pressure applied. Pt denies pain at the site currently. No complaints noted.  Patient free of lines, drains, and wounds.   An After Visit Summary (AVS) was printed and given to the patient. Patient escorted via wheelchair, and discharged home via private auto.  [place], Kem Kays, RN

## 2021-04-06 NOTE — Progress Notes (Signed)
Inpatient Diabetes Program Recommendations  AACE/ADA: New Consensus Statement on Inpatient Glycemic Control (2015)  Target Ranges:  Prepandial:   less than 140 mg/dL      Peak postprandial:   less than 180 mg/dL (1-2 hours)      Critically ill patients:  140 - 180 mg/dL   Lab Results  Component Value Date   GLUCAP 281 (H) 04/06/2021   HGBA1C 12.3 (H) 04/04/2021    Review of Glycemic Control Results for Richard Mendoza, Richard Mendoza (MRN [id number]) as of 04/06/2021 09:33  Ref. Range 04/05/2021 06:24 04/05/2021 12:11 04/05/2021 16:30 04/05/2021 20:52 04/06/2021 03:54 04/06/2021 06:37  Glucose-Capillary Latest Ref Range: 70 - 99 mg/dL 188 (H) 677 (H) 373 (H) 343 (H) 273 (H) 281 (H)   Inpatient Diabetes Program Recommendations:   If insulin pump not restarted -Increase Levemir to 35 units bid  Thank you, Darel Hong E. Gloyd Happ, RN, MSN, CDE  Diabetes Coordinator Inpatient Glycemic Control Team Team Pager [phone] (8am-5pm) 04/06/2021 9:37 AM

## 2021-04-06 NOTE — Discharge Summary (Signed)
Physician Discharge Summary  Richard Mendoza ZOX:[id number] DOB: 10/21/1976 DOA: 04/03/2021  PCP: Richard Reichmann, MD  Admit date: 04/03/2021 Discharge date: 04/06/2021  Admitted From: Home Disposition:  Home  Recommendations for Outpatient Follow-up:  1. Follow up with PCP in 1-2 weeks 2. Please obtain BMP/CBC in one week   Home Health:No Equipment/Devices:None  Discharge Condition:Stable CODE STATUS:Full Diet recommendation: Heart Healthy  Brief/Interim Summary: 45 y.o. male past medical history significant for insulin-dependent diabetes mellitus, essential hypertension, depression anxiety chronic renal insufficiency comes into the ED for lightheadedness on standing and transient loss of consciousness.  In the ED was found to be in acute kidney injury and hypotensive.  Orthostatics were positive.  Discharge Diagnoses:  Principal Problem:   Acute renal failure superimposed on stage 2 chronic kidney disease (HCC) Active Problems:   Uncontrolled diabetes mellitus with hyperglycemia (HCC)   OBSTRUCTIVE SLEEP APNEA   Essential hypertension   Chronic pain syndrome   Syncope  Acute kidney injury on chronic kidney disease stage II: In the setting of ACE inhibitor and diuretic use these were held on admission his baseline creatinine is around 1 on admission it was 5. He was started on IV fluids his creatinine returned to baseline we will continue to hold ACE inhibitor for 1 week. His creatinine improved.  Syncope due to orthostatic hypotension: Orthostatics were positive on admission he was fluid resuscitated these resolved.  Tonic-clonic movement as per family: Likely due to her syncopal episode. EEG showed no seizure events.  Insulin-dependent diabetes mellitus type 2: With an A1c of 12 his insulin pump.  He was started on long-acting insulin plus sliding scale fairly controlled. He will resume his insulin pump as an outpatient.  Obstructive sleep apnea: Continue CPAP at  night.  Peripheral vascular disease: Stump is clean.  Discharge Instructions  Discharge Instructions    Diet - low sodium heart healthy   Complete by: As directed    Increase activity slowly   Complete by: As directed      Allergies as of 04/06/2021   No Known Allergies     Medication List    STOP taking these medications   lisinopril 20 MG tablet Commonly known as: ZESTRIL     TAKE these medications   amLODipine-benazepril 10-40 MG capsule Commonly known as: LOTREL Take 1 capsule by mouth daily. Start taking on: Apr 13, 2021 What changed: These instructions start on Apr 13, 2021. If you are unsure what to do until then, ask your doctor or other care provider.   atorvastatin 40 MG tablet Commonly known as: LIPITOR Take 40 mg by mouth daily.   busPIRone 15 MG tablet Commonly known as: BUSPAR Take 15 mg by mouth 2 (two) times daily.   [facility] Senna 8.6 MG tablet Generic drug: senna Take 1 tablet by mouth 2 (two) times daily as needed for constipation.   dicyclomine 10 MG capsule Commonly known as: BENTYL Take 10 mg by mouth in the morning and at bedtime.   famotidine 20 MG tablet Commonly known as: PEPCID Take 20 mg by mouth daily.   fenofibrate 48 MG tablet Commonly known as: TRICOR Take 48 mg by mouth daily.   Fiasp 100 UNIT/ML Soln Generic drug: Insulin Aspart (w/Niacinamide) Inject into the skin continuous. Via pump   furosemide 40 MG tablet Commonly known as: LASIX Take 1 tablet (40 mg total) by mouth daily. Start taking on: Apr 10, 2021 What changed: These instructions start on Apr 10, 2021. If you are unsure what to  do until then, ask your doctor or other care provider.   gabapentin 100 MG capsule Commonly known as: NEURONTIN Take 200 mg by mouth 2 (two) times daily.   gabapentin 300 MG capsule Commonly known as: NEURONTIN Take 1 capsule (300 mg total) by mouth 2 (two) times daily with breakfast and lunch AND 2 capsules (600 mg total) at  bedtime.   linaclotide 72 MCG capsule Commonly known as: LINZESS Take 72 mcg by mouth daily as needed (constipation).   methocarbamol 500 MG tablet Commonly known as: ROBAXIN Take 2 tablets by mouth 2 (two) times daily as needed for muscle spasms.   metoCLOPramide 10 MG tablet Commonly known as: REGLAN Take 10 mg by mouth daily.   ondansetron 8 MG tablet Commonly known as: ZOFRAN Take 8 mg by mouth every 6 (six) hours as needed for nausea/vomiting.   oxyCODONE 5 MG immediate release tablet Commonly known as: Oxy IR/ROXICODONE Take 1 tablet (5 mg total) by mouth every 4 (four) hours as needed for moderate pain or severe pain.   pantoprazole 40 MG tablet Commonly known as: PROTONIX Take 40 mg by mouth daily.   POTASSIUM PO Take 10 mEq by mouth daily.   sertraline 100 MG tablet Commonly known as: ZOLOFT Take 100 mg by mouth daily.   sildenafil 20 MG tablet Commonly known as: REVATIO Take 100 mg by mouth daily as needed (erectile dysfunction).   traZODone 100 MG tablet Commonly known as: DESYREL Take 100 mg by mouth at bedtime.   vitamin B-12 1000 MCG tablet Commonly known as: CYANOCOBALAMIN Take 1,000 mcg by mouth in the morning and at bedtime.       No Known Allergies  Consultations:  None   Procedures/Studies: DG Neck Soft Tissue  Result Date: 04/04/2021 CLINICAL DATA:  Neck pain EXAM: NECK SOFT TISSUES - 1+ VIEW COMPARISON:  None. FINDINGS: There is no evidence of retropharyngeal soft tissue swelling or epiglottic enlargement. The cervical airway is unremarkable and no radio-opaque foreign body identified. C6-7 anterior cervical discectomy and fusion with instrumentation has been performed. IMPRESSION: Negative. Electronically Signed   By: Helyn Numbers MD   On: 04/04/2021 00:30   CT Head Wo Contrast  Result Date: 04/03/2021 CLINICAL DATA:  Dizziness with fall and trauma to left-sided head a EXAM: CT HEAD WITHOUT CONTRAST TECHNIQUE: Contiguous axial  images were obtained from the base of the skull through the vertex without intravenous contrast. COMPARISON:  None. FINDINGS: Brain: No evidence of acute infarction, hemorrhage, hydrocephalus, extra-axial collection or mass lesion/mass effect. Vascular: No hyperdense vessel or unexpected calcification. Skull: Normal. Negative for fracture or focal lesion. Sinuses/Orbits: The paranasal sinuses and mastoid air cells are predominantly clear. Orbits are grossly unremarkable. Other: Soft tissue density subcutaneous nodules measuring 8 mm at the right vertex, and 1.3 cm at the left occiput. IMPRESSION: 1. No acute intracranial abnormality. 2. Soft tissue density subcutaneous nodules measuring 8 mm at the right vertex, and 1.3 cm at the left occiput. These are nonspecific and may represent sebaceous cysts. Correlate with physical exam. Electronically Signed   By: Maudry Mayhew MD   On: 04/03/2021 21:39   DG Knee Complete 4 Views Right  Result Date: 04/03/2021 CLINICAL DATA:  Knee pain after fall from standing EXAM: RIGHT KNEE - COMPLETE 4+ VIEW COMPARISON:  May 20, 2018 FINDINGS: No evidence of fracture, dislocation, or joint effusion. Well corticated osseous irregularity along the lateral distal femoral condyle likely sequela prior injury. Superior and inferior patellar enthesophytes within an enthesophyte  in the patellar tendon at its attachment to the tibia, unchanged from prior. Soft tissues are unremarkable. IMPRESSION: 1. No acute fracture or dislocation. No joint effusion. 2. Well corticated osseous irregularity along the lateral distal femoral condyle likely sequela prior injury. Electronically Signed   By: Maudry MayhewJeffrey  Waltz MD   On: 04/03/2021 20:28   EEG adult  Result Date: 04/04/2021 Charlsie QuestYadav, Priyanka O, MD     04/04/2021  3:13 PM Patient Name: Richard LeepMichael Quist MRN: [id number] Epilepsy Attending: Charlsie QuestPriyanka O Yadav Referring Physician/Provider: Dr Odie Seraimothy Opyd Date: 04/04/2021 Duration: 36.02 mins Patient history:  45 year old male with syncope and seizure-like activity.  EEG to evaluate for seizures. Level of alertness: Awake, asleep AEDs during EEG study: Gabapentin Technical aspects: This EEG study was done with scalp electrodes positioned according to the 10-20 International system of electrode placement. Electrical activity was acquired at a sampling rate of 500Hz  and reviewed with a high frequency filter of 70Hz  and a low frequency filter of 1Hz . EEG data were recorded continuously and digitally stored. Description: The posterior dominant rhythm consists of 8 Hz activity of moderate voltage (25-35 uV) seen predominantly in posterior head regions, symmetric and reactive to eye opening and eye closing. Sleep was characterized by vertex waves, sleep spindles (12 to 14 Hz), maximal frontocentral region.  Hyperventilation and photic stimulation were not performed.   IMPRESSION: This study is within normal limits. No seizures or epileptiform discharges were seen throughout the recording. Charlsie Questriyanka O Yadav   ECHOCARDIOGRAM COMPLETE  Result Date: 04/04/2021    ECHOCARDIOGRAM REPORT   Patient Name:   Richard LeepMICHAEL Weilbacher Date of Exam: 04/04/2021 Medical Rec #:  [id number]      Height:       70.0 in Accession #:    [id number]     Weight:       220.0 lb Date of Birth:  26-Feb-1976      BSA:          2.174 m Patient Age:    45 years       BP:           97/57 mmHg Patient Gender: M              HR:           77 bpm. Exam Location:  Inpatient Procedure: 2D Echo, Cardiac Doppler and Color Doppler Indications:    Syncope  History:        Patient has no prior history of Echocardiogram examinations.                 Risk Factors:Hypertension and Diabetes.  Sonographer:    Shirlean KellyJohn Mendel Brown Referring Phys: [id number] TIMOTHY S OPYD IMPRESSIONS  1. Left ventricular ejection fraction, by estimation, is 55 to 60%. The left ventricle has normal function. The left ventricle has no regional wall motion abnormalities. There is mild concentric left  ventricular hypertrophy. Left ventricular diastolic parameters were normal.  2. Right ventricular systolic function is normal. The right ventricular size is normal.  3. The mitral valve is normal in structure. No evidence of mitral valve regurgitation.  4. The aortic valve is tricuspid. Aortic valve regurgitation is not visualized. No aortic stenosis is present.  5. The inferior vena cava is normal in size with greater than 50% respiratory variability, suggesting right atrial pressure of 3 mmHg. Comparison(s): No prior Echocardiogram. Conclusion(s)/Recommendation(s): Otherwise normal echocardiogram, with minor abnormalities described in the report. FINDINGS  Left Ventricle: Left ventricular ejection fraction, by estimation, is 55 to  60%. The left ventricle has normal function. The left ventricle has no regional wall motion abnormalities. The left ventricular internal cavity size was normal in size. There is  mild concentric left ventricular hypertrophy. Left ventricular diastolic parameters were normal. Right Ventricle: The right ventricular size is normal. No increase in right ventricular wall thickness. Right ventricular systolic function is normal. Left Atrium: Left atrial size was normal in size. Right Atrium: Right atrial size was normal in size. Pericardium: There is no evidence of pericardial effusion. Mitral Valve: The mitral valve is normal in structure. No evidence of mitral valve regurgitation. Tricuspid Valve: The tricuspid valve is normal in structure. Tricuspid valve regurgitation is not demonstrated. No evidence of tricuspid stenosis. Aortic Valve: The aortic valve is tricuspid. Aortic valve regurgitation is not visualized. No aortic stenosis is present. Aortic valve mean gradient measures 6.0 mmHg. Aortic valve peak gradient measures 9.7 mmHg. Aortic valve area, by VTI measures 3.66 cm. Pulmonic Valve: The pulmonic valve was normal in structure. Pulmonic valve regurgitation is not visualized. No  evidence of pulmonic stenosis. Aorta: The aortic root and ascending aorta are structurally normal, with no evidence of dilitation. Venous: The inferior vena cava is normal in size with greater than 50% respiratory variability, suggesting right atrial pressure of 3 mmHg. IAS/Shunts: The atrial septum is grossly normal.  LEFT VENTRICLE PLAX 2D LVIDd:         4.60 cm  Diastology LVIDs:         3.10 cm  LV e' medial:    9.90 cm/s LV PW:         1.20 cm  LV E/e' medial:  9.3 LV IVS:        1.20 cm  LV e' lateral:   14.60 cm/s LVOT diam:     2.40 cm  LV E/e' lateral: 6.3 LV SV:         97 LV SV Index:   45 LVOT Area:     4.52 cm  RIGHT VENTRICLE             IVC RV Basal diam:  4.00 cm     IVC diam: 1.00 cm RV S prime:     19.70 cm/s TAPSE (M-mode): 3.1 cm LEFT ATRIUM             Index       RIGHT ATRIUM           Index LA diam:        3.10 cm 1.43 cm/m  RA Area:     16.10 cm LA Vol (A2C):   45.0 ml 20.70 ml/m RA Volume:   37.30 ml  17.16 ml/m LA Vol (A4C):   53.4 ml 24.57 ml/m LA Biplane Vol: 52.9 ml 24.34 ml/m  AORTIC VALVE                    PULMONIC VALVE AV Area (Vmax):    4.06 cm     PV Vmax:       1.52 m/s AV Area (Vmean):   3.55 cm     PV Peak grad:  9.2 mmHg AV Area (VTI):     3.66 cm AV Vmax:           156.00 cm/s AV Vmean:          113.000 cm/s AV VTI:            0.266 m AV Peak Grad:      9.7 mmHg AV Mean Grad:  6.0 mmHg LVOT Vmax:         140.00 cm/s LVOT Vmean:        88.600 cm/s LVOT VTI:          0.215 m LVOT/AV VTI ratio: 0.81  AORTA Ao Root diam: 3.40 cm Ao Asc diam:  3.50 cm MITRAL VALVE MV Area (PHT): 4.31 cm    SHUNTS MV Decel Time: 176 msec    Systemic VTI:  0.22 m MV E velocity: 91.90 cm/s  Systemic Diam: 2.40 cm MV A velocity: 93.50 cm/s MV E/A ratio:  0.98 Riley Lam MD Electronically signed by Riley Lam MD Signature Date/Time: 04/04/2021/2:27:26 PM    Final    CT Renal Stone Study  Result Date: 04/03/2021 CLINICAL DATA:  Flank pain EXAM: CT ABDOMEN AND PELVIS  WITHOUT CONTRAST TECHNIQUE: Multidetector CT imaging of the abdomen and pelvis was performed following the standard protocol without IV contrast. COMPARISON:  10/20/2017 FINDINGS: Lower chest: No acute abnormality. Hepatobiliary: No focal liver abnormality is seen. No gallstones, gallbladder wall thickening, or biliary dilatation. Pancreas: Unremarkable. No pancreatic ductal dilatation or surrounding inflammatory changes. Spleen: Normal in size without focal abnormality. Adrenals/Urinary Tract: Adrenal glands are within normal limits. The kidneys demonstrate no evidence of renal calculi. The ureters are within normal limits bilaterally. The bladder is partially distended. Stomach/Bowel: The appendix has been surgically removed. No obstructive or inflammatory changes of the colon are seen. Small bowel and stomach are within normal limits. Vascular/Lymphatic: No significant vascular findings are present. No enlarged abdominal or pelvic lymph nodes. Left retroaortic renal vein is noted. Reproductive: Prostate is unremarkable. Other: No abdominal wall hernia or abnormality. No abdominopelvic ascites. Musculoskeletal: No acute bony abnormality is noted. IMPRESSION: No renal calculi or obstructive changes are noted. No acute abnormality noted. Electronically Signed   By: Alcide Clever M.D.   On: 04/03/2021 21:09     Subjective: Patient has no new complaints.  Discharge Exam: Vitals:   04/06/21 0356 04/06/21 0935  BP: (!) 145/85 (!) 142/92  Pulse: 64 80  Resp: 19 20  Temp: 98 F (36.7 C) 98.1 F (36.7 C)  SpO2: 96% 96%   Vitals:   04/05/21 2052 04/06/21 0356 04/06/21 0500 04/06/21 0935  BP: 140/81 (!) 145/85  (!) 142/92  Pulse: 81 64  80  Resp: 19 19  20   Temp: 98.6 F (37 C) 98 F (36.7 C)  98.1 F (36.7 C)  TempSrc: Oral Oral  Oral  SpO2: 95% 96%  96%  Weight:   100.4 kg   Height:        General: Pt is alert, awake, not in acute distress Cardiovascular: RRR, S1/S2 +, no rubs, no  gallops Respiratory: CTA bilaterally, no wheezing, no rhonchi Abdominal: Soft, NT, ND, bowel sounds + Extremities: no edema, no cyanosis    The results of significant diagnostics from this hospitalization (including imaging, microbiology, ancillary and laboratory) are listed below for reference.     Microbiology: Recent Results (from the past 240 hour(s))  SARS CORONAVIRUS 2 (TAT 6-24 HRS)     Status: None   Collection Time: 04/03/21 11:54 PM  Result Value Ref Range Status   SARS Coronavirus 2 NEGATIVE NEGATIVE Final    Comment: (NOTE) SARS-CoV-2 target nucleic acids are NOT DETECTED.  The SARS-CoV-2 RNA is generally detectable in upper and lower respiratory specimens during the acute phase of infection. Negative results do not preclude SARS-CoV-2 infection, do not rule out co-infections with other pathogens, and should not be used  as the sole basis for treatment or other patient management decisions. Negative results must be combined with clinical observations, patient history, and epidemiological information. The expected result is Negative.  Fact Sheet for Patients: HairSlick.no  Fact Sheet for Healthcare Providers: quierodirigir.com  This test is not yet approved or cleared by the [place] FDA and  has been authorized for detection and/or diagnosis of SARS-CoV-2 by FDA under an Emergency Use Authorization (EUA). This EUA will remain  in effect (meaning this test can be used) for the duration of the COVID-19 declaration under Se ction 564(b)(1) of the Act, 21 U.S.C. section 360bbb-3(b)(1), unless the authorization is terminated or revoked sooner.  Performed at [facility], [address], [place], [place] [address]      Labs: BNP (last 3 results) No results for input(s): BNP in the last 8760 hours. Basic Metabolic Panel: Recent Labs  Lab 04/03/21 1849 04/04/21 0301 04/05/21 0410 04/06/21 0238   NA 132* 137 136 136  K 3.8 3.5 4.0 3.9  CL 98 108 106 104  CO2 19* 21* 24 24  GLUCOSE 277* 220* 263* 295*  BUN 53* 47* 29* 21*  CREATININE 5.02* 4.11* 2.22* 1.79*  CALCIUM 8.9 8.3* 9.0 9.3  MG 2.6*  --   --   --    Liver Function Tests: Recent Labs  Lab 04/03/21 2110  AST 25  ALT 18  ALKPHOS 146*  BILITOT 0.3  PROT 7.2  ALBUMIN 3.7   No results for input(s): LIPASE, AMYLASE in the last 168 hours. No results for input(s): AMMONIA in the last 168 hours. CBC: Recent Labs  Lab 04/03/21 1849 04/04/21 0301 04/05/21 0410 04/06/21 0238  WBC 9.7 7.4 6.3 6.6  NEUTROABS 7.2  --   --   --   HGB 12.3* 10.7* 10.7* 10.6*  HCT 37.9* 33.9* 33.4* 32.5*  MCV 82.8 84.8 83.3 82.7  PLT 277 213 219 196   Cardiac Enzymes: No results for input(s): CKTOTAL, CKMB, CKMBINDEX, TROPONINI in the last 168 hours. BNP: Invalid input(s): POCBNP CBG: Recent Labs  Lab 04/05/21 1211 04/05/21 1630 04/05/21 2052 04/06/21 0354 04/06/21 0637  GLUCAP 339* 259* 343* 273* 281*   D-Dimer No results for input(s): DDIMER in the last 72 hours. Hgb A1c Recent Labs    04/04/21 0301  HGBA1C 12.3*   Lipid Profile No results for input(s): CHOL, HDL, LDLCALC, TRIG, CHOLHDL, LDLDIRECT in the last 72 hours. Thyroid function studies No results for input(s): TSH, T4TOTAL, T3FREE, THYROIDAB in the last 72 hours.  Invalid input(s): FREET3 Anemia work up No results for input(s): VITAMINB12, FOLATE, FERRITIN, TIBC, IRON, RETICCTPCT in the last 72 hours. Urinalysis    Component Value Date/Time   COLORURINE YELLOW 04/03/2021 2026   APPEARANCEUR CLEAR 04/03/2021 2026   LABSPEC 1.011 04/03/2021 2026   PHURINE 5.0 04/03/2021 2026   GLUCOSEU >=500 (A) 04/03/2021 2026   HGBUR SMALL (A) 04/03/2021 2026   BILIRUBINUR NEGATIVE 04/03/2021 2026   KETONESUR NEGATIVE 04/03/2021 2026   PROTEINUR NEGATIVE 04/03/2021 2026   UROBILINOGEN 0.2 07/31/2011 2217   NITRITE NEGATIVE 04/03/2021 2026   LEUKOCYTESUR  NEGATIVE 04/03/2021 2026   Sepsis Labs Invalid input(s): PROCALCITONIN,  WBC,  LACTICIDVEN Microbiology Recent Results (from the past 240 hour(s))  SARS CORONAVIRUS 2 (TAT 6-24 HRS)     Status: None   Collection Time: 04/03/21 11:54 PM  Result Value Ref Range Status   SARS Coronavirus 2 NEGATIVE NEGATIVE Final    Comment: (NOTE) SARS-CoV-2 target nucleic acids  are NOT DETECTED.  The SARS-CoV-2 RNA is generally detectable in upper and lower respiratory specimens during the acute phase of infection. Negative results do not preclude SARS-CoV-2 infection, do not rule out co-infections with other pathogens, and should not be used as the sole basis for treatment or other patient management decisions. Negative results must be combined with clinical observations, patient history, and epidemiological information. The expected result is Negative.  Fact Sheet for Patients: HairSlick.no  Fact Sheet for Healthcare Providers: quierodirigir.com  This test is not yet approved or cleared by the [place] FDA and  has been authorized for detection and/or diagnosis of SARS-CoV-2 by FDA under an Emergency Use Authorization (EUA). This EUA will remain  in effect (meaning this test can be used) for the duration of the COVID-19 declaration under Se ction 564(b)(1) of the Act, 21 U.S.C. section 360bbb-3(b)(1), unless the authorization is terminated or revoked sooner.  Performed at [facility], [address], [place], [place] [address]       SIGNED:   Marinda Elk, MD  [facility] 04/06/2021, 10:38 AM Pager   If 7PM-7AM, please contact night-coverage [email or web address] Password [email or web address]

## 2021-05-07 ENCOUNTER — Other Ambulatory Visit (INDEPENDENT_AMBULATORY_CARE_PROVIDER_SITE_OTHER): Payer: Self-pay | Admitting: Nurse Practitioner

## 2021-05-07 DIAGNOSIS — Z89512 Acquired absence of left leg below knee: Secondary | ICD-10-CM

## 2021-05-07 DIAGNOSIS — G546 Phantom limb syndrome with pain: Secondary | ICD-10-CM

## 2021-05-12 ENCOUNTER — Ambulatory Visit (INDEPENDENT_AMBULATORY_CARE_PROVIDER_SITE_OTHER): Payer: Medicaid Other | Admitting: Vascular Surgery

## 2021-05-12 ENCOUNTER — Encounter (INDEPENDENT_AMBULATORY_CARE_PROVIDER_SITE_OTHER): Payer: Medicaid Other

## 2021-05-26 ENCOUNTER — Other Ambulatory Visit: Payer: Self-pay

## 2021-05-26 ENCOUNTER — Ambulatory Visit (INDEPENDENT_AMBULATORY_CARE_PROVIDER_SITE_OTHER): Payer: Medicaid Other

## 2021-05-26 ENCOUNTER — Encounter (INDEPENDENT_AMBULATORY_CARE_PROVIDER_SITE_OTHER): Payer: Self-pay | Admitting: Vascular Surgery

## 2021-05-26 ENCOUNTER — Ambulatory Visit (INDEPENDENT_AMBULATORY_CARE_PROVIDER_SITE_OTHER): Payer: Medicaid Other | Admitting: Vascular Surgery

## 2021-05-26 VITALS — BP 163/98 | HR 88 | Resp 16 | Wt 213.8 lb

## 2021-05-26 DIAGNOSIS — I739 Peripheral vascular disease, unspecified: Secondary | ICD-10-CM | POA: Diagnosis not present

## 2021-05-26 DIAGNOSIS — G546 Phantom limb syndrome with pain: Secondary | ICD-10-CM | POA: Diagnosis not present

## 2021-05-26 DIAGNOSIS — I10 Essential (primary) hypertension: Secondary | ICD-10-CM

## 2021-05-26 DIAGNOSIS — Z89512 Acquired absence of left leg below knee: Secondary | ICD-10-CM

## 2021-05-26 DIAGNOSIS — E1142 Type 2 diabetes mellitus with diabetic polyneuropathy: Secondary | ICD-10-CM | POA: Diagnosis not present

## 2021-05-26 NOTE — Progress Notes (Signed)
MRN : [id number]  Richard Mendoza is a 45 y.o. ([birth year]) male who presents with chief complaint of  Chief Complaint  Patient presents with   Follow-up    3 month ABI  .  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  He does have a small ulcer just below the patella on the left leg but is otherwise doing great with this.  He is walking with his prosthesis and has been for a few months.  He is about 7 months postop now.  We checked his right leg for PAD today and his ABI was 1.3 on that side likely indicating some degree of medial calcification but his flow is preserved at this point.  Current Outpatient Medications  Medication Sig Dispense Refill   amLODipine-benazepril (LOTREL) 10-40 MG capsule Take 1 capsule by mouth daily.     atorvastatin (LIPITOR) 40 MG tablet Take 40 mg by mouth daily.   3   busPIRone (BUSPAR) 15 MG tablet Take 15 mg by mouth 2 (two) times daily.     [employer] SENNA 8.6 MG tablet Take 1 tablet by mouth 2 (two) times daily as needed for constipation.  5   dicyclomine (BENTYL) 10 MG capsule Take 10 mg by mouth in the morning and at bedtime.     famotidine (PEPCID) 20 MG tablet Take 20 mg by mouth daily.     fenofibrate (TRICOR) 48 MG tablet Take 48 mg by mouth daily.     FIASP 100 UNIT/ML SOLN Inject into the skin continuous. Via pump     furosemide (LASIX) 40 MG tablet Take 1 tablet (40 mg total) by mouth daily. 30 tablet    gabapentin (NEURONTIN) 300 MG capsule Take 1 capsule (300 mg total) by mouth 2 (two) times daily with breakfast and lunch AND 2 capsules (600 mg total) at bedtime. (Patient taking differently: Take 2 capsule (665m total) by mouth in the morning and 2 capsules in the evening) 120 capsule 3   linaclotide (LINZESS) 72 MCG capsule Take 72 mcg by mouth daily as needed (constipation).     methocarbamol (ROBAXIN) 500 MG tablet Take 2 tablets by mouth 2 (two) times daily as needed for muscle spasms.     metoCLOPramide  (REGLAN) 10 MG tablet Take 10 mg by mouth daily.     ondansetron (ZOFRAN) 8 MG tablet Take 8 mg by mouth every 6 (six) hours as needed for nausea/vomiting.     pantoprazole (PROTONIX) 40 MG tablet Take 40 mg by mouth daily.  5   POTASSIUM PO Take 10 mEq by mouth daily.     sertraline (ZOLOFT) 100 MG tablet Take 100 mg by mouth daily.   5   sildenafil (REVATIO) 20 MG tablet Take 100 mg by mouth daily as needed (erectile dysfunction).     traZODone (DESYREL) 100 MG tablet Take 100 mg by mouth at bedtime.     vitamin B-12 (CYANOCOBALAMIN) 1000 MCG tablet Take 1,000 mcg by mouth in the morning and at bedtime.      gabapentin (NEURONTIN) 100 MG capsule Take 200 mg by mouth 2 (two) times daily. (Patient not taking: Reported on 05/26/2021)     oxyCODONE (OXY IR/ROXICODONE) 5 MG immediate release tablet Take 1 tablet (5 mg total) by mouth every 4 (four) hours as needed for moderate pain or severe pain. (Patient not taking: No sig reported) 28 tablet 0   No current facility-administered medications for this visit.  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;  Surgeon: Sharlotte Alamo, DPM;  Location: [facility];  Service: Podiatry;  Laterality: Left;   AMPUTATION Left 11/16/2020   Procedure: AMPUTATION BELOW KNEE;  Surgeon: Algernon Huxley, MD;  Location: [facility];  Service: Vascular;  Laterality: Left;   ANTERIOR CERVICAL DECOMP/DISCECTOMY FUSION N/A 09/09/2017   Procedure: ANTERIOR CERVICAL DECOMPRESSION/DISCECTOMY FUSION CERVICAL 6- CERVICAL 7;  Surgeon: Ashok Pall, MD;  Location: [place];  Service: Neurosurgery;  Laterality: N/A;  ANTERIOR CERVICAL DECOMPRESSION/DISCECTOMY FUSION CERVICAL 6- CERVICAL 7    APPENDECTOMY     I & D EXTREMITY Right 10/03/2017   Procedure: IRRIGATION AND DEBRIDEMENT RIGHT WRIST;  Surgeon: Leanora Cover, MD;  Location: [place];  Service: Orthopedics;  Laterality: Right;   I & D EXTREMITY Right 11/26/2018   Procedure: IRRIGATION AND DEBRIDEMENT FASCIA ON RIGHT FOOT;  Surgeon: Samara Deist, DPM;  Location: [facility];  Service: Podiatry;  Laterality: Right;   INCISION AND DRAINAGE Right 03/06/2019   Procedure: INCISION AND DRAINAGE RIGHT FOOT, WITH 4th RAY AMPUTATION;  Surgeon: Samara Deist, DPM;  Location: [facility];  Service: Podiatry;  Laterality: Right;   INCISION AND DRAINAGE Left 11/07/2020   Procedure: INCISION AND DRAINAGE;  Surgeon: Sharlotte Alamo, DPM;  Location: [facility];  Service: Podiatry;  Laterality: Left;   IRRIGATION AND DEBRIDEMENT FOOT Left 11/11/2020   Procedure: IRRIGATION AND DEBRIDEMENT FOOT;  Surgeon: Sharlotte Alamo, DPM;  Location: [facility];  Service: Podiatry;  Laterality: Left;   METATARSAL HEAD EXCISION Right 05/15/2019   Procedure: OSTECTOMY;MET HEAD 5;  Surgeon: Samara Deist, DPM;  Location: [facility];  Service: Podiatry;  Laterality: Right;   osteomylitis     ROTATOR CUFF REPAIR Left      Social History   Tobacco Use   Smoking status: Former    Packs/day: 1.00    Years: 17.00    Pack years: 17.00    Types: Cigarettes    Quit date: 01/14/2019    Years since quitting: 2.3   Smokeless tobacco: Never  Vaping Use   Vaping Use: Never used  Substance Use Topics   Alcohol use: Yes    Comment: rare   Drug use: No       Family History  Problem Relation Age of Onset   Diabetes Mother    Heart disease Father    Diabetes Father    Arthritis Other    Hyperlipidemia Other    Hypertension Other    Cancer Other        breast   Mental illness Neg Hx      No Known Allergies   REVIEW OF SYSTEMS (Negative unless checked)  Constitutional: [] Weight loss  [] Fever  [] Chills Cardiac: [] Chest pain   [] Chest pressure   [] Palpitations    [] Shortness of breath when laying flat   [] Shortness of breath at rest   [] Shortness of breath with exertion. Vascular:  [] Pain in legs with walking   [] Pain in legs at rest   [] Pain in legs when laying flat   [] Claudication   [] Pain in feet when walking  [] Pain in feet at rest  [] Pain in feet when laying flat   [] History of DVT   [] Phlebitis   []   Swelling in legs   [] Varicose veins   [x] Non-healing ulcers Pulmonary:   [] Uses home oxygen   [] Productive cough   [] Hemoptysis   [] Wheeze  [] COPD   [] Asthma Neurologic:  [] Dizziness  [] Blackouts   [] Seizures   [] History of stroke   [] History of TIA  [] Aphasia   [] Temporary blindness   [] Dysphagia   [] Weakness or numbness in arms   [] Weakness or numbness in legs Musculoskeletal:  [] Arthritis   [] Joint swelling   [] Joint pain   [] Low back pain Hematologic:  [] Easy bruising  [] Easy bleeding   [] Hypercoagulable state   [] Anemic   Gastrointestinal:  [] Blood in stool   [] Vomiting blood  [] Gastroesophageal reflux/heartburn   [] Abdominal pain Genitourinary:  [] Chronic kidney disease   [] Difficult urination  [] Frequent urination  [] Burning with urination   [] Hematuria Skin:  [] Rashes   [] Ulcers   [] Wounds Psychological:  [] History of anxiety   []  History of major depression.  Physical Examination  BP (!) 163/98 (BP Location: Right Arm)   Pulse 88   Resp 16   Wt 213 lb 12.8 oz (97 kg)   BMI 30.68 kg/m  Gen:  WD/WN, NAD Head: [place]/AT, No temporalis wasting. Ear/Nose/Throat: Hearing grossly intact, nares w/o erythema or drainage Eyes: Conjunctiva clear. Sclera non-icteric Neck: Supple.  Trachea midline Pulmonary:  Good air movement, no use of accessory muscles.  Cardiac: RRR, no JVD Vascular:  Vessel Right Left  Radial Palpable Palpable                          PT Palpable Not palpable  DP Palpable Not palpable   Gastrointestinal: soft, non-tender/non-distended. No guarding/reflex.  Musculoskeletal: M/S 5/5 throughout.  Left BKA incision is  well-healed.  He does have a small prepatellar ulcer from pressure from his prosthesis.  No lower extremity edema. Neurologic: Sensation grossly intact in extremities.  Symmetrical.  Speech is fluent.  Psychiatric: Judgment intact, Mood & affect appropriate for pt's clinical situation. Dermatologic: No rashes or ulcers noted.  No cellulitis or open wounds.      Labs Recent Results (from the past 2160 hour(s))  CBC with Differential     Status: Abnormal   Collection Time: 04/03/21  6:49 PM  Result Value Ref Range   WBC 9.7 4.0 - 10.5 K/uL   RBC 4.58 4.22 - 5.81 MIL/uL   Hemoglobin 12.3 (L) 13.0 - 17.0 g/dL   HCT 37.9 (L) 39.0 - 52.0 %   MCV 82.8 80.0 - 100.0 fL   MCH 26.9 26.0 - 34.0 pg   MCHC 32.5 30.0 - 36.0 g/dL   RDW 15.7 (H) 11.5 - 15.5 %   Platelets 277 150 - 400 K/uL   nRBC 0.0 0.0 - 0.2 %   Neutrophils Relative % 74 %   Neutro Abs 7.2 1.7 - 7.7 K/uL   Lymphocytes Relative 19 %   Lymphs Abs 1.8 0.7 - 4.0 K/uL   Monocytes Relative 6 %   Monocytes Absolute 0.6 0.1 - 1.0 K/uL   Eosinophils Relative 1 %   Eosinophils Absolute 0.1 0.0 - 0.5 K/uL   Basophils Relative 0 %   Basophils Absolute 0.0 0.0 - 0.1 K/uL   Immature Granulocytes 0 %   Abs Immature Granulocytes 0.03 0.00 - 0.07 K/uL    Comment: Performed at [facility], [address], [place], [place] [address]  Basic metabolic panel     Status: Abnormal   Collection Time: 04/03/21  6:49 PM  Result Value Ref Range   Sodium 132 (L) 135 - 145 mmol/L   Potassium 3.8 3.5 - 5.1 mmol/L   Chloride 98 98 - 111 mmol/L   CO2 19 (L) 22 - 32 mmol/L   Glucose, Bld 277 (H) 70 - 99 mg/dL    Comment: Glucose reference range applies only to samples taken after fasting for at least 8 hours.   BUN 53 (H) 6 - 20 mg/dL   Creatinine, Ser 5.02 (H) 0.61 - 1.24 mg/dL   Calcium 8.9 8.9 - 10.3 mg/dL   GFR, Estimated 14 (L) >60 mL/min    Comment: (NOTE) Calculated using the CKD-EPI Creatinine Equation (2021) CORRECTED ON 05/16 AT  2020: PREVIOUSLY REPORTED AS 14    Anion gap 15 5 - 15    Comment: Performed at [facility] [address] [place], [place] [address]  Troponin I (High Sensitivity)     Status: None   Collection Time: 04/03/21  6:49 PM  Result Value Ref Range   Troponin I (High Sensitivity) 7 <18 ng/L    Comment: (NOTE) Elevated high sensitivity troponin I (hsTnI) values and significant  changes across serial measurements may suggest ACS but many other  chronic and acute conditions are known to elevate hsTnI results.  Refer to the Links section for chest pain algorithms and additional  guidance. Performed at [facility], [place] [address] [place], [place] [address] CORRECTED ON 05/16 AT 2020: PREVIOUSLY REPORTED AS 7   Magnesium     Status: Abnormal   Collection Time: 04/03/21  6:49 PM  Result Value Ref Range   Magnesium 2.6 (H) 1.7 - 2.4 mg/dL    Comment: Performed at [facility] [address], [place], [place] [address]  Urinalysis, Routine w reflex microscopic     Status: Abnormal   Collection Time: 04/03/21  8:26 PM  Result Value Ref Range   Color, Urine YELLOW YELLOW   APPearance CLEAR CLEAR   Specific Gravity, Urine 1.011 1.005 - 1.030   pH 5.0 5.0 - 8.0   Glucose, UA >=500 (A) NEGATIVE mg/dL   Hgb urine dipstick SMALL (A) NEGATIVE   Bilirubin Urine NEGATIVE NEGATIVE   Ketones, ur NEGATIVE NEGATIVE mg/dL   Protein, ur NEGATIVE NEGATIVE mg/dL   Nitrite NEGATIVE NEGATIVE   Leukocytes,Ua NEGATIVE NEGATIVE   RBC / HPF 0-5 0 - 5 RBC/hpf   WBC, UA 0-5 0 - 5 WBC/hpf   Bacteria, UA RARE (A) NONE SEEN   Squamous Epithelial / LPF 0-5 0 - 5   Mucus PRESENT    Hyaline Casts, UA PRESENT     Comment: Performed at [facility] [address], [place], [place] [address]  Troponin I (High Sensitivity)     Status: None   Collection Time: 04/03/21  9:10 PM  Result Value Ref Range   Troponin I (High Sensitivity) 6 <18 ng/L    Comment: (NOTE) Elevated high sensitivity  troponin I (hsTnI) values and significant  changes across serial measurements may suggest ACS but many other  chronic and acute conditions are known to elevate hsTnI results.  Refer to the "Links" section for chest pain algorithms and additional  guidance. Performed at [facility], [place] [address], [place], [place] [address]   Hepatic function panel     Status: Abnormal   Collection Time: 04/03/21  9:10 PM  Result Value Ref Range   Total Protein 7.2 6.5 - 8.1 g/dL   Albumin  3.7 3.5 - 5.0 g/dL   AST 25 15 - 41 U/L   ALT 18 0 - 44 U/L   Alkaline Phosphatase 146 (H) 38 - 126 U/L   Total Bilirubin 0.3 0.3 - 1.2 mg/dL   Bilirubin, Direct <0.1 0.0 - 0.2 mg/dL   Indirect Bilirubin NOT CALCULATED 0.3 - 0.9 mg/dL    Comment: Performed at [facility] [address], [place], [place] [address]  Protein / creatinine ratio, urine     Status: Abnormal   Collection Time: 04/03/21  9:53 PM  Result Value Ref Range   Creatinine, Urine 142.51 mg/dL   Total Protein, Urine 39 mg/dL    Comment: NO NORMAL RANGE ESTABLISHED FOR THIS TEST   Protein Creatinine Ratio 0.27 (H) 0.00 - 0.15 mg/mg[Cre]    Comment: Performed at [facility], [place] [address], [place], [place] [address]  CBG monitoring, ED     Status: Abnormal   Collection Time: 04/03/21 11:31 PM  Result Value Ref Range   Glucose-Capillary 214 (H) 70 - 99 mg/dL    Comment: Glucose reference range applies only to samples taken after fasting for at least 8 hours.  SARS CORONAVIRUS 2 (TAT 6-24 HRS)     Status: None   Collection Time: 04/03/21 11:54 PM  Result Value Ref Range   SARS Coronavirus 2 NEGATIVE NEGATIVE    Comment: (NOTE) SARS-CoV-2 target nucleic acids are NOT DETECTED.  The SARS-CoV-2 RNA is generally detectable in upper and lower respiratory specimens during the acute phase of infection. Negative results do not preclude SARS-CoV-2 infection, do not rule out co-infections with other pathogens, and should not be  used as the sole basis for treatment or other patient management decisions. Negative results must be combined with clinical observations, patient history, and epidemiological information. The expected result is Negative.  Fact Sheet for Patients: SugarRoll.be  Fact Sheet for Healthcare Providers: https://www.woods-mathews.com/  This test is not yet approved or cleared by the [place] FDA and  has been authorized for detection and/or diagnosis of SARS-CoV-2 by FDA under an Emergency Use Authorization (EUA). This EUA will remain  in effect (meaning this test can be used) for the duration of the COVID-19 declaration under Se ction 564(b)(1) of the Act, 21 U.S.C. section 360bbb-3(b)(1), unless the authorization is terminated or revoked sooner.  Performed at [facility], [place] [address] [place], [place] [address]   Basic metabolic panel     Status: Abnormal   Collection Time: 04/04/21  3:01 AM  Result Value Ref Range   Sodium 137 135 - 145 mmol/L   Potassium 3.5 3.5 - 5.1 mmol/L   Chloride 108 98 - 111 mmol/L   CO2 21 (L) 22 - 32 mmol/L   Glucose, Bld 220 (H) 70 - 99 mg/dL    Comment: Glucose reference range applies only to samples taken after fasting for at least 8 hours.   BUN 47 (H) 6 - 20 mg/dL   Creatinine, Ser 4.11 (H) 0.61 - 1.24 mg/dL   Calcium 8.3 (L) 8.9 - 10.3 mg/dL   GFR, Estimated 17 (L) >60 mL/min    Comment: (NOTE) Calculated using the CKD-EPI Creatinine Equation (2021)    Anion gap 8 5 - 15    Comment: Performed at [facility] [address], [place], [place] [address]  CBC     Status: Abnormal   Collection Time: 04/04/21  3:01 AM  Result Value Ref Range   WBC 7.4  4.0 - 10.5 K/uL   RBC 4.00 (L) 4.22 - 5.81 MIL/uL   Hemoglobin 10.7 (L) 13.0 - 17.0 g/dL   HCT 33.9 (L) 39.0 - 52.0 %   MCV 84.8 80.0 - 100.0 fL   MCH 26.8 26.0 - 34.0 pg   MCHC 31.6 30.0 - 36.0 g/dL   RDW 15.8 (H) 11.5 - 15.5 %    Platelets 213 150 - 400 K/uL   nRBC 0.0 0.0 - 0.2 %    Comment: Performed at [facility] [address] [place], [place] [address]  Hemoglobin A1c     Status: Abnormal   Collection Time: 04/04/21  3:01 AM  Result Value Ref Range   Hgb A1c MFr Bld 12.3 (H) 4.8 - 5.6 %    Comment: (NOTE) Pre diabetes:          5.7%-6.4%  Diabetes:              >6.4%  Glycemic control for   <7.0% adults with diabetes    Mean Plasma Glucose 306.31 mg/dL    Comment: Performed at [facility] [address], [place], [place] [address]  CBG monitoring, ED     Status: Abnormal   Collection Time: 04/04/21  5:20 AM  Result Value Ref Range   Glucose-Capillary 226 (H) 70 - 99 mg/dL    Comment: Glucose reference range applies only to samples taken after fasting for at least 8 hours.  CBG monitoring, ED     Status: Abnormal   Collection Time: 04/04/21  7:59 AM  Result Value Ref Range   Glucose-Capillary 230 (H) 70 - 99 mg/dL    Comment: Glucose reference range applies only to samples taken after fasting for at least 8 hours.  Cortisol, Random     Status: None   Collection Time: 04/04/21  8:14 AM  Result Value Ref Range   Cortisol, Plasma 17.0 ug/dL    Comment: (NOTE) AM    6.7 - 22.6 ug/dL PM   <10.0       ug/dL Performed at [facility] [address] [place], [place] [address]   Glucose, capillary     Status: Abnormal   Collection Time: 04/04/21 12:02 PM  Result Value Ref Range   Glucose-Capillary 292 (H) 70 - 99 mg/dL    Comment: Glucose reference range applies only to samples taken after fasting for at least 8 hours.  ECHOCARDIOGRAM COMPLETE     Status: None   Collection Time: 04/04/21  1:39 PM  Result Value Ref Range   Weight 3,520 oz   Height 70 in   BP 125/76 mmHg   S' Lateral 3.10 cm   AR max vel 4.06 cm2   AV Area VTI 3.66 cm2   AV Mean grad 6.0 mmHg   AV Peak grad 9.7 mmHg   Ao pk vel 1.56 m/s   Area-P 1/2 4.31 cm2   AV Area mean vel 3.55 cm2  Glucose,  capillary     Status: Abnormal   Collection Time: 04/04/21  2:55 PM  Result Value Ref Range   Glucose-Capillary 304 (H) 70 - 99 mg/dL    Comment: Glucose reference range applies only to samples taken after fasting for at least 8 hours.  Glucose, capillary     Status: Abnormal   Collection Time: 04/04/21  4:39 PM  Result Value Ref Range   Glucose-Capillary 299 (H) 70 - 99 mg/dL    Comment: Glucose reference range applies only  to samples taken after fasting for at least 8 hours.  Glucose, capillary     Status: Abnormal   Collection Time: 04/04/21  8:47 PM  Result Value Ref Range   Glucose-Capillary 268 (H) 70 - 99 mg/dL    Comment: Glucose reference range applies only to samples taken after fasting for at least 8 hours.  Basic metabolic panel     Status: Abnormal   Collection Time: 04/05/21  4:10 AM  Result Value Ref Range   Sodium 136 135 - 145 mmol/L   Potassium 4.0 3.5 - 5.1 mmol/L   Chloride 106 98 - 111 mmol/L   CO2 24 22 - 32 mmol/L   Glucose, Bld 263 (H) 70 - 99 mg/dL    Comment: Glucose reference range applies only to samples taken after fasting for at least 8 hours.   BUN 29 (H) 6 - 20 mg/dL   Creatinine, Ser 2.22 (H) 0.61 - 1.24 mg/dL    Comment: DELTA CHECK NOTED REC'D FLUIDS    Calcium 9.0 8.9 - 10.3 mg/dL   GFR, Estimated 36 (L) >60 mL/min    Comment: (NOTE) Calculated using the CKD-EPI Creatinine Equation (2021)    Anion gap 6 5 - 15    Comment: Performed at [facility] [address] [place], [place] [address]  CBC     Status: Abnormal   Collection Time: 04/05/21  4:10 AM  Result Value Ref Range   WBC 6.3 4.0 - 10.5 K/uL   RBC 4.01 (L) 4.22 - 5.81 MIL/uL   Hemoglobin 10.7 (L) 13.0 - 17.0 g/dL   HCT 33.4 (L) 39.0 - 52.0 %   MCV 83.3 80.0 - 100.0 fL   MCH 26.7 26.0 - 34.0 pg   MCHC 32.0 30.0 - 36.0 g/dL   RDW 15.4 11.5 - 15.5 %   Platelets 219 150 - 400 K/uL   nRBC 0.0 0.0 - 0.2 %    Comment: Performed at [facility], [place] [address],  [place], [place] [address]  Glucose, capillary     Status: Abnormal   Collection Time: 04/05/21  6:24 AM  Result Value Ref Range   Glucose-Capillary 219 (H) 70 - 99 mg/dL    Comment: Glucose reference range applies only to samples taken after fasting for at least 8 hours.  Glucose, capillary     Status: Abnormal   Collection Time: 04/05/21 12:11 PM  Result Value Ref Range   Glucose-Capillary 339 (H) 70 - 99 mg/dL    Comment: Glucose reference range applies only to samples taken after fasting for at least 8 hours.  Glucose, capillary     Status: Abnormal   Collection Time: 04/05/21  4:30 PM  Result Value Ref Range   Glucose-Capillary 259 (H) 70 - 99 mg/dL    Comment: Glucose reference range applies only to samples taken after fasting for at least 8 hours.  Glucose, capillary     Status: Abnormal   Collection Time: 04/05/21  8:52 PM  Result Value Ref Range   Glucose-Capillary 343 (H) 70 - 99 mg/dL    Comment: Glucose reference range applies only to samples taken after fasting for at least 8 hours.  Basic metabolic panel     Status: Abnormal   Collection Time: 04/06/21  2:38 AM  Result Value Ref Range   Sodium 136 135 - 145 mmol/L   Potassium 3.9 3.5 - 5.1 mmol/L   Chloride 104 98 - 111 mmol/L   CO2 24 22 -  32 mmol/L   Glucose, Bld 295 (H) 70 - 99 mg/dL    Comment: Glucose reference range applies only to samples taken after fasting for at least 8 hours.   BUN 21 (H) 6 - 20 mg/dL   Creatinine, Ser 1.79 (H) 0.61 - 1.24 mg/dL   Calcium 9.3 8.9 - 10.3 mg/dL   GFR, Estimated 47 (L) >60 mL/min    Comment: (NOTE) Calculated using the CKD-EPI Creatinine Equation (2021)    Anion gap 8 5 - 15    Comment: Performed at [facility] [address] [place], [place] [address]  CBC     Status: Abnormal   Collection Time: 04/06/21  2:38 AM  Result Value Ref Range   WBC 6.6 4.0 - 10.5 K/uL   RBC 3.93 (L) 4.22 - 5.81 MIL/uL   Hemoglobin 10.6 (L) 13.0 - 17.0 g/dL   HCT 32.5 (L) 39.0 - 52.0 %    MCV 82.7 80.0 - 100.0 fL   MCH 27.0 26.0 - 34.0 pg   MCHC 32.6 30.0 - 36.0 g/dL   RDW 14.9 11.5 - 15.5 %   Platelets 196 150 - 400 K/uL   nRBC 0.0 0.0 - 0.2 %    Comment: Performed at [facility], [place] [address] [place], [place] [address]  Glucose, capillary     Status: Abnormal   Collection Time: 04/06/21  3:54 AM  Result Value Ref Range   Glucose-Capillary 273 (H) 70 - 99 mg/dL    Comment: Glucose reference range applies only to samples taken after fasting for at least 8 hours.  Glucose, capillary     Status: Abnormal   Collection Time: 04/06/21  6:37 AM  Result Value Ref Range   Glucose-Capillary 281 (H) 70 - 99 mg/dL    Comment: Glucose reference range applies only to samples taken after fasting for at least 8 hours.  Glucose, capillary     Status: Abnormal   Collection Time: 04/06/21 11:51 AM  Result Value Ref Range   Glucose-Capillary 260 (H) 70 - 99 mg/dL    Comment: Glucose reference range applies only to samples taken after fasting for at least 8 hours.    Radiology VAS [place] ABI WITH/WO TBI  Result Date: 05/26/2021  LOWER EXTREMITY DOPPLER STUDY Patient Name:  DAIMIEN PATMON  Date of Exam:   05/26/2021 Medical Rec #: [id number]       Accession #:    [id number] Date of Birth: [birth year]       Patient Gender: M Patient Age:   73Y Exam Location:  [facility] Procedure:      VAS [place] ABI WITH/WO TBI Referring Phys: [id number] [place] --------------------------------------------------------------------------------  Indications: Peripheral artery disease.  Performing Technologist: Almira Coaster RVS  Examination Guidelines: A complete evaluation includes at minimum, Doppler waveform signals and systolic blood pressure reading at the level of bilateral brachial, anterior tibial, and posterior tibial arteries, when vessel segments are accessible. Bilateral testing is considered an integral part of a complete examination. Photoelectric Plethysmograph (PPG) waveforms  and toe systolic pressure readings are included as required and additional duplex testing as needed. Limited examinations for reoccurring indications may be performed as noted.  ABI Findings: +---------+------------------+-----+---------+--------+ Right    Rt Pressure (mmHg)IndexWaveform Comment  +---------+------------------+-----+---------+--------+ Brachial 152                                      +---------+------------------+-----+---------+--------+  ATA      208               1.31 triphasic         +---------+------------------+-----+---------+--------+ PTA      199               1.25 triphasic         +---------+------------------+-----+---------+--------+ Great Toe209               1.31 Normal            +---------+------------------+-----+---------+--------+ +--------+------------------+-----+--------+-------+ Left    Lt Pressure (mmHg)IndexWaveformComment +--------+------------------+-----+--------+-------+ [id number]                                    +--------+------------------+-----+--------+-------+ +-------+-----------+-----------+------------+------------+ ABI/TBIToday's ABIToday's TBIPrevious ABIPrevious TBI +-------+-----------+-----------+------------+------------+ Right  1.31       1.31                                +-------+-----------+-----------+------------+------------+  Summary: Right: Resting right ankle-brachial index is within normal range. No evidence of significant right lower extremity arterial disease. The right toe-brachial index is normal.  *See table(s) above for measurements and observations.  Electronically signed by Leotis Pain MD on 05/26/2021 at 8:37:58 AM.    Final     Assessment/Plan  PAD (peripheral artery disease) ([place]) Although he lost his left leg primarily from infection from diabetes and not PAD, he does have longstanding diabetes and we will need to monitor him for PAD.  His ABI today is greater than 1.3 but  he has good waveforms.  There is likely some degree of medial calcification but no flow limitation at this point.  Recheck in 1 year.  Essential hypertension blood pressure control important in reducing the progression of atherosclerotic disease. On appropriate oral medications.   DM type 2 with diabetic peripheral neuropathy (HCC) blood glucose control important in reducing the progression of atherosclerotic disease. Also, involved in wound healing. On appropriate medications.   S/P BKA (below knee amputation), left ([place]) Well healed although he has a small prepatellar ulcer from pressure.  Going to go see his Prosthetist next week.    Leotis Pain, MD  05/26/2021 10:14 AM    This note was created with Dragon medical transcription system.  Any errors from dictation are purely unintentional

## 2021-05-26 NOTE — Assessment & Plan Note (Signed)
Although he lost his left leg primarily from infection from diabetes and not PAD, he does have longstanding diabetes and we will need to monitor him for PAD.  His ABI today is greater than 1.3 but he has good waveforms.  There is likely some degree of medial calcification but no flow limitation at this point.  Recheck in 1 year.

## 2021-05-26 NOTE — Assessment & Plan Note (Signed)
Well healed although he has a small prepatellar ulcer from pressure.  Going to go see his Prosthetist next week.

## 2021-05-26 NOTE — Assessment & Plan Note (Signed)
blood pressure control important in reducing the progression of atherosclerotic disease. On appropriate oral medications.  

## 2021-05-26 NOTE — Assessment & Plan Note (Signed)
blood glucose control important in reducing the progression of atherosclerotic disease. Also, involved in wound healing. On appropriate medications.  

## 2021-07-27 ENCOUNTER — Telehealth (INDEPENDENT_AMBULATORY_CARE_PROVIDER_SITE_OTHER): Payer: Self-pay | Admitting: Vascular Surgery

## 2021-07-27 NOTE — Telephone Encounter (Signed)
Wife called stating that left leg looks infected patient has open sores and wound is weeping (blood/yellow creamy fluid). Patients wife has been giving his antibiotics for a week but wound has not healed much. Patients' wife would like him to come in to be evaluated. Patient was last seen 05/2021

## 2021-07-28 NOTE — Telephone Encounter (Signed)
Called and scheduled patient

## 2021-07-28 NOTE — Telephone Encounter (Signed)
He can see me or dew no studies

## 2021-08-01 ENCOUNTER — Other Ambulatory Visit (INDEPENDENT_AMBULATORY_CARE_PROVIDER_SITE_OTHER): Payer: Self-pay | Admitting: Nurse Practitioner

## 2021-08-01 ENCOUNTER — Ambulatory Visit (INDEPENDENT_AMBULATORY_CARE_PROVIDER_SITE_OTHER): Payer: Medicaid Other | Admitting: Nurse Practitioner

## 2021-08-01 ENCOUNTER — Other Ambulatory Visit: Payer: Self-pay

## 2021-08-01 VITALS — BP 187/109 | HR 89 | Ht 70.0 in | Wt 209.0 lb

## 2021-08-01 DIAGNOSIS — Z89512 Acquired absence of left leg below knee: Secondary | ICD-10-CM

## 2021-08-01 DIAGNOSIS — E1142 Type 2 diabetes mellitus with diabetic polyneuropathy: Secondary | ICD-10-CM

## 2021-08-06 ENCOUNTER — Encounter (INDEPENDENT_AMBULATORY_CARE_PROVIDER_SITE_OTHER): Payer: Self-pay | Admitting: Nurse Practitioner

## 2021-08-06 NOTE — Progress Notes (Signed)
Subjective:    Patient ID: Richard Mendoza, male    DOB: Apr 13, 1976, 45 y.o.   MRN: [id number] Chief Complaint  Patient presents with   Follow-up    Add on per phone note Lle wound    Richard Mendoza is a 45 year old male that presents today due to having a wound on the lateral side of his left lower extremity.  The patient previously had a left below-knee amputation.  He utilizes his prosthesis daily.  He notes that this initially began as a small blister and then eventually it ruptured and had purulent drainage.  The patient had some antibiotics left from previous prescriptions, which he notes to be Keflex, and he began to utilize that.  He notes that since he started using these antibiotics the drainage has improved and the wound itself has improved somewhat.  He denies any fevers or chills.  He denies any other open wounds or ulcerations.   Review of Systems  Skin:  Positive for wound.  All other systems reviewed and are negative.     Objective:   Physical Exam Vitals reviewed.  HENT:     Head: Normocephalic.  Cardiovascular:     Rate and Rhythm: Normal rate.  Pulmonary:     Effort: Pulmonary effort is normal.  Musculoskeletal:     Left Lower Extremity: Left leg is amputated below knee.  Skin:    Findings: Wound present.       Neurological:     Mental Status: He is alert and oriented to person, place, and time.     Motor: Weakness present.  Psychiatric:        Mood and Affect: Mood normal.        Behavior: Behavior normal.        Thought Content: Thought content normal.        Judgment: Judgment normal.    BP (!) 187/109   Pulse 89   Ht _0  (1.778 m)   Wt 209 lb (94.8 kg)   BMI 29.99 kg/m   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    Social History   Socioeconomic History   Marital status: Married     Spouse name: Not on file   Number of children: 4   Years of education: Not on file   Highest education level: Not on file  Occupational History   Not on file  Tobacco Use   Smoking status: Former    Packs/day: 1.00    Years: 17.00    Pack years: 17.00    Types: Cigarettes    Quit date: 01/14/2019    Years since quitting: 2.5   Smokeless tobacco: Never  Vaping Use   Vaping Use: Never used  Substance and Sexual Activity   Alcohol use: Yes    Comment: rare   Drug use: No   Sexual activity: Yes  Other Topics Concern   Not on file  Social History Narrative   Not on file   Social Determinants of Health   Financial Resource Strain: Not on file  Food Insecurity: Not on file  Transportation Needs: Not on file  Physical Activity: Not on file  Stress: Not on file  Social Connections: Not on file  Intimate Partner Violence: Not on file    Past Surgical History:  Procedure Laterality Date   AMPUTATION Left 11/07/2020  Procedure: 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;  Surgeon: Sharlotte Alamo, DPM;  Location: [facility];  Service: Podiatry;  Laterality: Left;   AMPUTATION Left 11/16/2020   Procedure: AMPUTATION BELOW KNEE;  Surgeon: Algernon Huxley, MD;  Location: [facility];  Service: Vascular;  Laterality: Left;   ANTERIOR CERVICAL DECOMP/DISCECTOMY FUSION N/A 09/09/2017   Procedure: ANTERIOR CERVICAL DECOMPRESSION/DISCECTOMY FUSION CERVICAL 6- CERVICAL 7;  Surgeon: Ashok Pall, MD;  Location: [facility];  Service: Neurosurgery;  Laterality: N/A;  ANTERIOR CERVICAL DECOMPRESSION/DISCECTOMY FUSION CERVICAL 6- CERVICAL 7   APPENDECTOMY     I & D EXTREMITY Right 10/03/2017   Procedure: IRRIGATION AND DEBRIDEMENT RIGHT WRIST;  Surgeon: Leanora Cover, MD;  Location: [facility];  Service: Orthopedics;  Laterality: Right;   I & D EXTREMITY Right 11/26/2018   Procedure: IRRIGATION AND DEBRIDEMENT FASCIA ON RIGHT FOOT;  Surgeon: Samara Deist, DPM;  Location: [facility];  Service: Podiatry;   Laterality: Right;   INCISION AND DRAINAGE Right 03/06/2019   Procedure: INCISION AND DRAINAGE RIGHT FOOT, WITH 4th RAY AMPUTATION;  Surgeon: Samara Deist, DPM;  Location: [facility];  Service: Podiatry;  Laterality: Right;   INCISION AND DRAINAGE Left 11/07/2020   Procedure: INCISION AND DRAINAGE;  Surgeon: Sharlotte Alamo, DPM;  Location: [facility];  Service: Podiatry;  Laterality: Left;   IRRIGATION AND DEBRIDEMENT FOOT Left 11/11/2020   Procedure: IRRIGATION AND DEBRIDEMENT FOOT;  Surgeon: Sharlotte Alamo, DPM;  Location: [facility];  Service: Podiatry;  Laterality: Left;   METATARSAL HEAD EXCISION Right 05/15/2019   Procedure: OSTECTOMY;MET HEAD 5;  Surgeon: Samara Deist, DPM;  Location: [facility];  Service: Podiatry;  Laterality: Right;   osteomylitis     ROTATOR CUFF REPAIR Left     Family History  Problem Relation Age of Onset   Diabetes Mother    Heart disease Father    Diabetes Father    Arthritis Other    Hyperlipidemia Other    Hypertension Other    Cancer Other        breast   Mental illness Neg Hx     No Known Allergies  CBC Latest Ref Rng & Units 04/06/2021 04/05/2021 04/04/2021  WBC 4.0 - 10.5 K/uL 6.6 6.3 7.4  Hemoglobin 13.0 - 17.0 g/dL 10.6(L) 10.7(L) 10.7(L)  Hematocrit 39.0 - 52.0 % 32.5(L) 33.4(L) 33.9(L)  Platelets 150 - 400 K/uL 196 219 213      CMP     Component Value Date/Time   NA 136 04/06/2021 0238   K 3.9 04/06/2021 0238   CL 104 04/06/2021 0238   CO2 24 04/06/2021 0238   GLUCOSE 295 (H) 04/06/2021 0238   BUN 21 (H) 04/06/2021 0238   CREATININE 1.79 (H) 04/06/2021 0238   CALCIUM 9.3 04/06/2021 0238   PROT 7.2 04/03/2021 2110   ALBUMIN 3.7 04/03/2021 2110   AST 25 04/03/2021 2110   ALT 18 04/03/2021 2110   ALKPHOS 146 (H) 04/03/2021 2110   BILITOT 0.3 04/03/2021 2110   GFRNONAA 47 (L) 04/06/2021 0238   GFRAA >60 05/05/2020 1930     No results found.     Assessment & Plan:   1. S/P BKA (below knee amputation), left ([place]) Suspect that the  wound started as a blister and due to continued irritation from the patient's prosthesis it turned into a wound.  We will culture the area to ensure that there is no lingering infection.  The patient is advised to continue taking the antibiotics that he does have.  The  patient is advised to keep the wound clean and dry and to place Aquacel on the wound to be changed every 2 to 3 days.  We will have the patient return in 3 weeks for evaluation of wound progression.  2. DM type 2 with diabetic peripheral neuropathy ([place]) Continue hypoglycemic medications as already ordered, these medications have been reviewed and there are no changes at this time.  Hgb A1C to be monitored as already arranged by primary service    Current Outpatient Medications on File Prior to Visit  Medication Sig Dispense Refill   atorvastatin (LIPITOR) 40 MG tablet Take 40 mg by mouth daily.   3   benazepril (LOTENSIN) 20 MG tablet Take 20 mg by mouth daily.     busPIRone (BUSPAR) 15 MG tablet Take 15 mg by mouth 2 (two) times daily.     [employer] SENNA 8.6 MG tablet Take 1 tablet by mouth 2 (two) times daily as needed for constipation.  5   dicyclomine (BENTYL) 10 MG capsule Take 10 mg by mouth in the morning and at bedtime.     famotidine (PEPCID) 20 MG tablet Take 20 mg by mouth daily.     fenofibrate (TRICOR) 48 MG tablet Take 48 mg by mouth daily.     FIASP 100 UNIT/ML SOLN Inject into the skin continuous. Via pump     furosemide (LASIX) 40 MG tablet Take 1 tablet (40 mg total) by mouth daily. 30 tablet    gabapentin (NEURONTIN) 100 MG capsule Take 200 mg by mouth 2 (two) times daily.     gabapentin (NEURONTIN) 300 MG capsule Take 1 capsule (300 mg total) by mouth 2 (two) times daily with breakfast and lunch AND 2 capsules (600 mg total) at bedtime. (Patient taking differently: Take 2 capsule (641m total) by mouth in the morning and 2 capsules in the evening) 120 capsule 3   linaclotide (LINZESS) 72 MCG capsule Take 72 mcg by  mouth daily as needed (constipation).     methocarbamol (ROBAXIN) 500 MG tablet Take 2 tablets by mouth 2 (two) times daily as needed for muscle spasms.     metoCLOPramide (REGLAN) 10 MG tablet Take 10 mg by mouth daily.     ondansetron (ZOFRAN) 8 MG tablet Take 8 mg by mouth every 6 (six) hours as needed for nausea/vomiting.     oxyCODONE (OXY IR/ROXICODONE) 5 MG immediate release tablet Take 1 tablet (5 mg total) by mouth every 4 (four) hours as needed for moderate pain or severe pain. 28 tablet 0   pantoprazole (PROTONIX) 40 MG tablet Take 40 mg by mouth daily.  5   POTASSIUM PO Take 10 mEq by mouth daily.     sertraline (ZOLOFT) 100 MG tablet Take 100 mg by mouth daily.   5   sildenafil (REVATIO) 20 MG tablet Take 100 mg by mouth daily as needed (erectile dysfunction).     traZODone (DESYREL) 100 MG tablet Take 100 mg by mouth at bedtime.     vitamin B-12 (CYANOCOBALAMIN) 1000 MCG tablet Take 1,000 mcg by mouth in the morning and at bedtime.      No current facility-administered medications on file prior to visit.    There are no Patient Instructions on file for this visit. No follow-ups on file.   FKris Hartmann NP

## 2021-08-10 LAB — AEROBIC CULTURE

## 2021-08-22 ENCOUNTER — Ambulatory Visit (INDEPENDENT_AMBULATORY_CARE_PROVIDER_SITE_OTHER): Payer: Medicaid Other | Admitting: Nurse Practitioner

## 2021-09-06 ENCOUNTER — Inpatient Hospital Stay
Admission: EM | Admit: 2021-09-06 | Discharge: 2021-09-09 | DRG: 872 | Disposition: A | Discharge status: Home / Self Care | Payer: Medicaid Other | Attending: Internal Medicine | Admitting: Internal Medicine

## 2021-09-06 ENCOUNTER — Emergency Department: Payer: Medicaid Other

## 2021-09-06 ENCOUNTER — Telehealth (INDEPENDENT_AMBULATORY_CARE_PROVIDER_SITE_OTHER): Payer: Self-pay | Admitting: Nurse Practitioner

## 2021-09-06 ENCOUNTER — Other Ambulatory Visit: Payer: Self-pay

## 2021-09-06 DIAGNOSIS — Z20822 Contact with and (suspected) exposure to covid-19: Secondary | ICD-10-CM | POA: Diagnosis present

## 2021-09-06 DIAGNOSIS — E785 Hyperlipidemia, unspecified: Secondary | ICD-10-CM | POA: Diagnosis present

## 2021-09-06 DIAGNOSIS — E1142 Type 2 diabetes mellitus with diabetic polyneuropathy: Secondary | ICD-10-CM | POA: Diagnosis present

## 2021-09-06 DIAGNOSIS — E876 Hypokalemia: Secondary | ICD-10-CM | POA: Diagnosis present

## 2021-09-06 DIAGNOSIS — Z8249 Family history of ischemic heart disease and other diseases of the circulatory system: Secondary | ICD-10-CM

## 2021-09-06 DIAGNOSIS — D638 Anemia in other chronic diseases classified elsewhere: Secondary | ICD-10-CM | POA: Diagnosis present

## 2021-09-06 DIAGNOSIS — N179 Acute kidney failure, unspecified: Secondary | ICD-10-CM | POA: Diagnosis present

## 2021-09-06 DIAGNOSIS — L089 Local infection of the skin and subcutaneous tissue, unspecified: Secondary | ICD-10-CM | POA: Diagnosis not present

## 2021-09-06 DIAGNOSIS — L0390 Cellulitis, unspecified: Secondary | ICD-10-CM | POA: Diagnosis present

## 2021-09-06 DIAGNOSIS — E11628 Type 2 diabetes mellitus with other skin complications: Secondary | ICD-10-CM | POA: Diagnosis not present

## 2021-09-06 DIAGNOSIS — A401 Sepsis due to streptococcus, group B: Principal | ICD-10-CM | POA: Diagnosis present

## 2021-09-06 DIAGNOSIS — F32A Depression, unspecified: Secondary | ICD-10-CM | POA: Diagnosis present

## 2021-09-06 DIAGNOSIS — Z720 Tobacco use: Secondary | ICD-10-CM | POA: Diagnosis present

## 2021-09-06 DIAGNOSIS — A419 Sepsis, unspecified organism: Secondary | ICD-10-CM | POA: Diagnosis not present

## 2021-09-06 DIAGNOSIS — E1122 Type 2 diabetes mellitus with diabetic chronic kidney disease: Secondary | ICD-10-CM | POA: Diagnosis present

## 2021-09-06 DIAGNOSIS — K219 Gastro-esophageal reflux disease without esophagitis: Secondary | ICD-10-CM | POA: Diagnosis present

## 2021-09-06 DIAGNOSIS — Z89512 Acquired absence of left leg below knee: Secondary | ICD-10-CM | POA: Diagnosis not present

## 2021-09-06 DIAGNOSIS — Z833 Family history of diabetes mellitus: Secondary | ICD-10-CM | POA: Diagnosis not present

## 2021-09-06 DIAGNOSIS — E1169 Type 2 diabetes mellitus with other specified complication: Secondary | ICD-10-CM | POA: Diagnosis present

## 2021-09-06 DIAGNOSIS — Z9641 Presence of insulin pump (external) (internal): Secondary | ICD-10-CM | POA: Diagnosis present

## 2021-09-06 DIAGNOSIS — I10 Essential (primary) hypertension: Secondary | ICD-10-CM | POA: Diagnosis not present

## 2021-09-06 DIAGNOSIS — G894 Chronic pain syndrome: Secondary | ICD-10-CM | POA: Diagnosis present

## 2021-09-06 DIAGNOSIS — E871 Hypo-osmolality and hyponatremia: Secondary | ICD-10-CM | POA: Diagnosis present

## 2021-09-06 DIAGNOSIS — G4733 Obstructive sleep apnea (adult) (pediatric): Secondary | ICD-10-CM | POA: Diagnosis present

## 2021-09-06 DIAGNOSIS — E1151 Type 2 diabetes mellitus with diabetic peripheral angiopathy without gangrene: Secondary | ICD-10-CM | POA: Diagnosis present

## 2021-09-06 DIAGNOSIS — L03116 Cellulitis of left lower limb: Secondary | ICD-10-CM | POA: Diagnosis present

## 2021-09-06 DIAGNOSIS — N182 Chronic kidney disease, stage 2 (mild): Secondary | ICD-10-CM | POA: Diagnosis present

## 2021-09-06 DIAGNOSIS — Z87891 Personal history of nicotine dependence: Secondary | ICD-10-CM | POA: Diagnosis not present

## 2021-09-06 DIAGNOSIS — I129 Hypertensive chronic kidney disease with stage 1 through stage 4 chronic kidney disease, or unspecified chronic kidney disease: Secondary | ICD-10-CM | POA: Diagnosis present

## 2021-09-06 LAB — URINALYSIS, COMPLETE (UACMP) WITH MICROSCOPIC
Bacteria, UA: NONE SEEN
Bilirubin Urine: NEGATIVE
Glucose, UA: >=500 mg/dL — AB
Ketones, ur: NEGATIVE mg/dL
Leukocytes,Ua: NEGATIVE
Nitrite: NEGATIVE
Protein, ur: 30 mg/dL — AB
Specific Gravity, Urine: 1.024 (ref 1.005–1.030)
pH: 5 (ref 5.0–8.0)

## 2021-09-06 LAB — COMPREHENSIVE METABOLIC PANEL
ALT: 14 U/L (ref 0–44)
ALT: 14 U/L (ref 0–44)
AST: 19 U/L (ref 15–41)
AST: 19 U/L (ref 15–41)
Albumin: 2.8 g/dL — ABNORMAL LOW (ref 3.5–5.0)
Albumin: 2.9 g/dL — ABNORMAL LOW (ref 3.5–5.0)
Alkaline Phosphatase: 99 U/L (ref 38–126)
Alkaline Phosphatase: 100 U/L (ref 38–126)
Anion gap: 9 (ref 5–15)
Anion gap: 9 (ref 5–15)
BUN: 30 mg/dL — ABNORMAL HIGH (ref 6–20)
BUN: 32 mg/dL — ABNORMAL HIGH (ref 6–20)
CO2: 21 mmol/L — ABNORMAL LOW (ref 22–32)
CO2: 21 mmol/L — ABNORMAL LOW (ref 22–32)
Calcium: 9 mg/dL (ref 8.9–10.3)
Calcium: 8.8 mg/dL — ABNORMAL LOW (ref 8.9–10.3)
Chloride: 102 mmol/L (ref 98–111)
Chloride: 99 mmol/L (ref 98–111)
Creatinine, Ser: 1.92 mg/dL — ABNORMAL HIGH (ref 0.61–1.24)
Creatinine, Ser: 1.77 mg/dL — ABNORMAL HIGH (ref 0.61–1.24)
GFR, Estimated: 43 mL/min — ABNORMAL LOW (ref >60)
GFR, Estimated: 48 mL/min — ABNORMAL LOW (ref >60)
Glucose, Bld: 469 mg/dL — ABNORMAL HIGH (ref 70–99)
Glucose, Bld: 454 mg/dL — ABNORMAL HIGH (ref 70–99)
Potassium: 3 mmol/L — ABNORMAL LOW (ref 3.5–5.1)
Potassium: 2.7 mmol/L — CL (ref 3.5–5.1)
Sodium: 132 mmol/L — ABNORMAL LOW (ref 135–145)
Sodium: 129 mmol/L — ABNORMAL LOW (ref 135–145)
Total Bilirubin: 0.5 mg/dL (ref 0.3–1.2)
Total Bilirubin: 0.6 mg/dL (ref 0.3–1.2)
Total Protein: 7.1 g/dL (ref 6.5–8.1)
Total Protein: 7.3 g/dL (ref 6.5–8.1)

## 2021-09-06 LAB — CBC WITH DIFFERENTIAL/PLATELET
Abs Immature Granulocytes: 0.09 10*3/uL — ABNORMAL HIGH (ref 0.00–0.07)
Basophils Absolute: 0 10*3/uL (ref 0.0–0.1)
Basophils Relative: 0 %
Eosinophils Absolute: 0.1 10*3/uL (ref 0.0–0.5)
Eosinophils Relative: 1 %
HCT: 30.9 % — ABNORMAL LOW (ref 39.0–52.0)
Hemoglobin: 10.7 g/dL — ABNORMAL LOW (ref 13.0–17.0)
Immature Granulocytes: 1 %
Lymphocytes Relative: 9 %
Lymphs Abs: 1.3 10*3/uL (ref 0.7–4.0)
MCH: 28.8 pg (ref 26.0–34.0)
MCHC: 34.6 g/dL (ref 30.0–36.0)
MCV: 83.1 fL (ref 80.0–100.0)
Monocytes Absolute: 0.9 10*3/uL (ref 0.1–1.0)
Monocytes Relative: 6 %
Neutro Abs: 11.6 10*3/uL — ABNORMAL HIGH (ref 1.7–7.7)
Neutrophils Relative %: 83 %
Platelets: 322 10*3/uL (ref 150–400)
RBC: 3.72 MIL/uL — ABNORMAL LOW (ref 4.22–5.81)
RDW: 14.5 % (ref 11.5–15.5)
WBC: 13.9 10*3/uL — ABNORMAL HIGH (ref 4.0–10.5)
nRBC: 0 % (ref 0.0–0.2)

## 2021-09-06 LAB — CBC
HCT: 30.6 % — ABNORMAL LOW (ref 39.0–52.0)
Hemoglobin: 10.6 g/dL — ABNORMAL LOW (ref 13.0–17.0)
MCH: 28.6 pg (ref 26.0–34.0)
MCHC: 34.6 g/dL (ref 30.0–36.0)
MCV: 82.5 fL (ref 80.0–100.0)
Platelets: 309 10*3/uL (ref 150–400)
RBC: 3.71 MIL/uL — ABNORMAL LOW (ref 4.22–5.81)
RDW: 14.5 % (ref 11.5–15.5)
WBC: 13.6 10*3/uL — ABNORMAL HIGH (ref 4.0–10.5)
nRBC: 0 % (ref 0.0–0.2)

## 2021-09-06 LAB — LACTIC ACID, PLASMA
Lactic Acid, Venous: 2.4 mmol/L (ref 0.5–1.9)
Lactic Acid, Venous: 2.1 mmol/L (ref 0.5–1.9)
Lactic Acid, Venous: 1.8 mmol/L (ref 0.5–1.9)
Lactic Acid, Venous: 1.6 mmol/L (ref 0.5–1.9)

## 2021-09-06 LAB — RESP PANEL BY RT-PCR (FLU A&B, COVID) ARPGX2
Influenza A by PCR: NEGATIVE
Influenza B by PCR: NEGATIVE
SARS Coronavirus 2 by RT PCR: NEGATIVE

## 2021-09-06 LAB — PROTIME-INR
INR: 1.1 (ref 0.8–1.2)
Prothrombin Time: 14.1 seconds (ref 11.4–15.2)

## 2021-09-06 LAB — CK: Total CK: 141 U/L (ref 49–397)

## 2021-09-06 LAB — SEDIMENTATION RATE: Sed Rate: 82 mm/hr — ABNORMAL HIGH (ref 0–15)

## 2021-09-06 LAB — GLUCOSE, CAPILLARY: Glucose-Capillary: 286 mg/dL — ABNORMAL HIGH (ref 70–99)

## 2021-09-06 LAB — APTT: aPTT: 29 seconds (ref 24–36)

## 2021-09-06 MED ORDER — SODIUM CHLORIDE 0.9 % IV SOLN
2.0000 g | Freq: Three times a day (TID) | INTRAVENOUS | Status: DC
  Administered 2021-09-06 – 2021-09-09 (×8): 2 g via INTRAVENOUS
  Filled 2021-09-06 (×11): qty 2

## 2021-09-06 MED ORDER — ONDANSETRON HCL 4 MG/2ML IJ SOLN: 4.0000 mg | Freq: Four times a day (QID) | INTRAMUSCULAR | Status: DC | PRN

## 2021-09-06 MED ORDER — LACTATED RINGERS IV SOLN: INTRAVENOUS | Status: DC

## 2021-09-06 MED ORDER — LACTATED RINGERS IV BOLUS (SEPSIS)
1000.0000 mL | Freq: Once | INTRAVENOUS | Status: AC
  Administered 2021-09-06: 1000 mL via INTRAVENOUS

## 2021-09-06 MED ORDER — VANCOMYCIN HCL 1500 MG/300ML IV SOLN
1500.0000 mg | INTRAVENOUS | Status: DC
  Filled 2021-09-06: qty 300

## 2021-09-06 MED ORDER — INSULIN ASPART 100 UNIT/ML IJ SOLN
0.0000 [IU] | Freq: Every day | INTRAMUSCULAR | Status: DC
  Administered 2021-09-06: 3 [IU] via SUBCUTANEOUS
  Filled 2021-09-06: qty 1

## 2021-09-06 MED ORDER — MORPHINE SULFATE (PF) 2 MG/ML IV SOLN
2.0000 mg | INTRAVENOUS | Status: DC | PRN
  Administered 2021-09-06 – 2021-09-07 (×3): 2 mg via INTRAVENOUS
  Filled 2021-09-06 (×3): qty 1

## 2021-09-06 MED ORDER — MORPHINE SULFATE (PF) 4 MG/ML IV SOLN
4.0000 mg | Freq: Once | INTRAVENOUS | Status: AC
  Administered 2021-09-06: 4 mg via INTRAVENOUS
  Filled 2021-09-06: qty 1

## 2021-09-06 MED ORDER — INSULIN ASPART 100 UNIT/ML IJ SOLN
0.0000 [IU] | Freq: Three times a day (TID) | INTRAMUSCULAR | Status: DC
  Administered 2021-09-07: 2.9 [IU] via SUBCUTANEOUS

## 2021-09-06 MED ORDER — ONDANSETRON HCL 4 MG PO TABS: 4.0000 mg | ORAL_TABLET | Freq: Four times a day (QID) | ORAL | Status: DC | PRN

## 2021-09-06 MED ORDER — HEPARIN SODIUM (PORCINE) 5000 UNIT/ML IJ SOLN
5000.0000 [IU] | Freq: Three times a day (TID) | INTRAMUSCULAR | Status: DC
  Administered 2021-09-06 – 2021-09-07 (×3): 5000 [IU] via SUBCUTANEOUS
  Filled 2021-09-06 (×3): qty 1

## 2021-09-06 MED ORDER — HYDROMORPHONE HCL 1 MG/ML IJ SOLN
1.0000 mg | INTRAMUSCULAR | Status: DC | PRN
  Administered 2021-09-06 – 2021-09-07 (×6): 1 mg via INTRAVENOUS
  Filled 2021-09-06 (×6): qty 1

## 2021-09-06 MED ORDER — VANCOMYCIN HCL IN DEXTROSE 1-5 GM/200ML-% IV SOLN
1000.0000 mg | Freq: Once | INTRAVENOUS | Status: AC
  Administered 2021-09-06: 1000 mg via INTRAVENOUS
  Filled 2021-09-06: qty 200

## 2021-09-06 MED ORDER — POTASSIUM CHLORIDE IN NACL 40-0.9 MEQ/L-% IV SOLN
INTRAVENOUS | Status: DC
  Filled 2021-09-06 (×6): qty 1000

## 2021-09-06 MED ORDER — ONDANSETRON HCL 4 MG/2ML IJ SOLN
4.0000 mg | Freq: Once | INTRAMUSCULAR | Status: AC
  Administered 2021-09-06: 4 mg via INTRAVENOUS
  Filled 2021-09-06: qty 2

## 2021-09-06 MED ORDER — SODIUM CHLORIDE 0.9 % IV SOLN
2.0000 g | Freq: Once | INTRAVENOUS | Status: AC
  Administered 2021-09-06: 2 g via INTRAVENOUS
  Filled 2021-09-06: qty 20

## 2021-09-06 NOTE — Consult Note (Signed)
PHARMACY -  BRIEF ANTIBIOTIC NOTE   Pharmacy has received consult(s) for vancomycin from an ED provider.  The patient's profile has been reviewed for ht/wt/allergies/indication/available labs.    One time order(s) placed for vancomycin 1g IV x1  Further antibiotics/pharmacy consults should be ordered by admitting physician if indicated.                       Thank you, Doloris Hall, PharmD Pharmacy Resident  09/06/2021 5:16 PM

## 2021-09-06 NOTE — Telephone Encounter (Signed)
Wife called stating that patient wanted to let provider know that cannot put his prosthetic on because of infected wound. Wife states that infection is getting worse and patient is going to hospital within the next few hours to be evaluated. FB advised me to tell patient that he needs to make sure he is evaluated by vascular surgery before being discharged.   This note is for documentation purposes only.

## 2021-09-06 NOTE — Sepsis Progress Note (Signed)
Code sepsis protocol being monitored by e[employer]. 

## 2021-09-06 NOTE — H&P (Signed)
History and Physical   Richard Mendoza YJE:[id number] DOB: 04-14-1976 DOA: 09/06/2021  Referring MD/NP/PA: Dr. Rip Harbour  PCP: Tracie Harrier, MD   Outpatient Specialists: Dr. Elie Confer, vascular surgery and Dr. Dayton Bailiff, neurosurgery  Patient coming from: Home  Chief Complaint: Left leg wound and redness  HPI: Richard Mendoza is a 45 y.o. male with medical history significant of peripheral vascular disease with ulceration status post left BKA, diabetes, essential hypertension, hyperlipidemia, GERD, chronic kidney disease stage II who presented to the ER with worsening ulceration as well as redness of his left leg above his stump.  Patient has open wound on the medial aspect of his left knee and lower thigh.  This been ongoing and is being followed by vascular.  He reported that his prosthesis course the wound.  He had a culture outpatient apparently growing organisms susceptible to Keflex.  He was followed by vascular surgery and had an appointment scheduled on October 4 but did not make his appointment.  He is here today now with redness that and go up the whole thigh and it came suddenly.  No fever but he felt some chills.  He was seen in the ER and appears to have new cellulitis of the stump area but also involving the whole thigh.  He also meets sepsis criteria.  Patient therefore is being admitted to the hospital for further evaluation and treatment..  ED Course: Temperature is 98.0 blood pressure 189/98, pulse 95 respirate of 22 and oxygen sat 98% on room air.  White count is 13.9 hemoglobin 10.7 and platelets of 322.  Sodium 129 potassium 3.7 chloride 99 CO2 20 BUN 30 creatinine 1.77 calcium 8.8 glucose 454 INR 1.1 lactic acid 2.4.  Urinalysis essentially negative.  Chest x-ray showed questionable patchy density peripherally at the lung bases.  X-ray of the left knee showed probable BKA but no acute bony abnormalities.  Patient being admitted with infected wound and cellulitis of the left lower  extremity.  Review of Systems: As per HPI otherwise 10 point review of systems negative.    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;  Surgeon: Sharlotte Alamo, DPM;  Location: [facility];  Service: Podiatry;  Laterality: Left;   AMPUTATION Left 11/16/2020   Procedure: AMPUTATION BELOW KNEE;  Surgeon: Algernon Huxley, MD;  Location: [facility];  Service: Vascular;  Laterality: Left;   ANTERIOR CERVICAL DECOMP/DISCECTOMY FUSION N/A 09/09/2017   Procedure: ANTERIOR CERVICAL DECOMPRESSION/DISCECTOMY FUSION CERVICAL 6- CERVICAL 7;  Surgeon: Ashok Pall, MD;  Location: [facility];  Service: Neurosurgery;  Laterality: N/A;  ANTERIOR CERVICAL DECOMPRESSION/DISCECTOMY FUSION CERVICAL 6- CERVICAL 7   APPENDECTOMY     I & D EXTREMITY Right 10/03/2017   Procedure: IRRIGATION AND DEBRIDEMENT RIGHT WRIST;  Surgeon: Leanora Cover, MD;  Location: [facility];  Service: Orthopedics;  Laterality: Right;   I & D EXTREMITY Right 11/26/2018   Procedure: IRRIGATION AND DEBRIDEMENT FASCIA ON RIGHT FOOT;  Surgeon: Samara Deist, DPM;  Location: [facility];  Service: Podiatry;  Laterality: Right;   INCISION AND DRAINAGE Right 03/06/2019   Procedure: INCISION AND DRAINAGE RIGHT FOOT, WITH 4th RAY AMPUTATION;  Surgeon: Samara Deist, DPM;  Location: [facility];  Service: Podiatry;  Laterality: Right;  INCISION AND DRAINAGE Left 11/07/2020   Procedure: INCISION AND DRAINAGE;  Surgeon: Sharlotte Alamo, DPM;  Location: [facility];  Service: Podiatry;  Laterality: Left;   IRRIGATION AND DEBRIDEMENT FOOT Left 11/11/2020   Procedure: IRRIGATION AND DEBRIDEMENT FOOT;  Surgeon: Sharlotte Alamo, DPM;  Location: [facility];  Service: Podiatry;  Laterality: Left;    METATARSAL HEAD EXCISION Right 05/15/2019   Procedure: OSTECTOMY;MET HEAD 5;  Surgeon: Samara Deist, DPM;  Location: [facility];  Service: Podiatry;  Laterality: Right;   osteomylitis     ROTATOR CUFF REPAIR Left      reports that he quit smoking about 2 years ago. His smoking use included cigarettes. He has a 17.00 pack-year smoking history. He has never used smokeless tobacco. He reports current alcohol use. He reports that he does not use drugs.  No Known Allergies  Family History  Problem Relation Age of Onset   Diabetes Mother    Heart disease Father    Diabetes Father    Arthritis Other    Hyperlipidemia Other    Hypertension Other    Cancer Other        breast   Mental illness Neg Hx      Prior to Admission medications   Medication Sig Start Date End Date Taking? Authorizing Provider  atorvastatin (LIPITOR) 40 MG tablet Take 40 mg by mouth daily.  08/23/17   [provider]  benazepril (LOTENSIN) 20 MG tablet Take 20 mg by mouth daily. 06/01/21   [provider]  busPIRone (BUSPAR) 15 MG tablet Take 15 mg by mouth 2 (two) times daily.    [provider]  [facility] SENNA 8.6 MG tablet Take 1 tablet by mouth 2 (two) times daily as needed for constipation. 10/31/17   [provider]  dicyclomine (BENTYL) 10 MG capsule Take 10 mg by mouth in the morning and at bedtime. 04/27/20   [provider]  famotidine (PEPCID) 20 MG tablet Take 20 mg by mouth daily.    [provider]  fenofibrate (TRICOR) 48 MG tablet Take 48 mg by mouth daily. 09/07/20   [provider]  FIASP 100 UNIT/ML SOLN Inject into the skin continuous. Via pump 10/09/20   [provider]  furosemide (LASIX) 40 MG tablet Take 1 tablet (40 mg total) by mouth daily. 04/10/21   Charlynne Cousins, MD  gabapentin (NEURONTIN) 100 MG capsule Take 200 mg by mouth 2 (two) times daily.    [provider]  gabapentin (NEURONTIN) 300 MG capsule Take 1  capsule (300 mg total) by mouth 2 (two) times daily with breakfast and lunch AND 2 capsules (600 mg total) at bedtime. Patient taking differently: Take 2 capsule (625m total) by mouth in the morning and 2 capsules in the evening 01/20/21   BKris Hartmann NP  linaclotide ([facility] 72 MCG capsule Take 72 mcg by mouth daily as needed (constipation).    [provider]  methocarbamol (ROBAXIN) 500 MG tablet Take 2 tablets by mouth 2 (two) times daily as needed for muscle spasms. 03/17/21   [provider]  metoCLOPramide (REGLAN) 10 MG tablet Take 10 mg by mouth daily. 05/25/19   [provider]  ondansetron (ZOFRAN) 8 MG tablet Take 8 mg by mouth every 6 (six) hours as needed for nausea/vomiting.    [provider]  oxyCODONE (OXY IR/ROXICODONE) 5 MG immediate release tablet Take 1 tablet (5 mg total) by mouth every 4 (four) hours as needed for moderate pain or  severe pain. 11/21/20   Stegmayer, Joelene Millin A, PA-C  pantoprazole (PROTONIX) 40 MG tablet Take 40 mg by mouth daily. 08/23/17   [provider]  POTASSIUM PO Take 10 mEq by mouth daily.    [provider]  sertraline (ZOLOFT) 100 MG tablet Take 100 mg by mouth daily.  10/31/17   [provider]  sildenafil (REVATIO) 20 MG tablet Take 100 mg by mouth daily as needed (erectile dysfunction).    [provider]  traZODone (DESYREL) 100 MG tablet Take 100 mg by mouth at bedtime.    [provider]  vitamin B-12 (CYANOCOBALAMIN) 1000 MCG tablet Take 1,000 mcg by mouth in the morning and at bedtime.  01/27/19   [provider]    Physical Exam: Vitals:   09/06/21 1532 09/06/21 1533  BP: (!) 142/92   Pulse: 95   Resp: 16   Temp: 98.1 F (36.7 C)   TempSrc: Oral   SpO2: 98%   Weight:  90.7 kg  Height:  5' 10"  (1.778 m)      Constitutional: NAD, calm, comfortable Vitals:   09/06/21 1532 09/06/21 1533  BP: (!) 142/92   Pulse: 95   Resp: 16   Temp: 98.1  F (36.7 C)   TempSrc: Oral   SpO2: 98%   Weight:  90.7 kg  Height:  5' 10"  (1.778 m)   Eyes: PERRL, lids and conjunctivae normal ENMT: Mucous membranes are dry. Posterior pharynx clear of any exudate or lesions.Normal dentition.  Neck: normal, supple, no masses, no thyromegaly Respiratory: clear to auscultation bilaterally, no wheezing, no crackles. Normal respiratory effort. No accessory muscle use.  Cardiovascular: Regular rate and rhythm, no murmurs / rubs / gallops. No extremity edema. 2+ pedal pulses. No carotid bruits.  Abdomen: no tenderness, no masses palpated. No hepatosplenomegaly. Bowel sounds positive.  Musculoskeletal: no clubbing / cyanosis.  Status post for left BKA. Good ROM, no contractures. Normal muscle tone.  Skin: Left lower extremity wound on the medial aspect with purulent discharge Neurologic: CN 2-12 grossly intact. Sensation intact, DTR normal. Strength 5/5 in all 4.  Psychiatric: Normal judgment and insight. Alert and oriented x 3. Normal mood.     Labs on Admission: I have personally reviewed following labs and imaging studies  CBC: Recent Labs  Lab 09/06/21 1536 09/06/21 1708  WBC 13.6* 13.9*  NEUTROABS  --  11.6*  HGB 10.6* 10.7*  HCT 30.6* 30.9*  MCV 82.5 83.1  PLT 309 166   Basic Metabolic Panel: Recent Labs  Lab 09/06/21 1536 09/06/21 1708  NA 132* 129*  K 3.0* 2.7*  CL 102 99  CO2 21* 21*  GLUCOSE 469* 454*  BUN 30* 32*  CREATININE 1.92* 1.77*  CALCIUM 9.0 8.8*   GFR: Estimated Creatinine Clearance: 59.7 mL/min (A) (by C-G formula based on SCr of 1.77 mg/dL (H)). Liver Function Tests: Recent Labs  Lab 09/06/21 1536 09/06/21 1708  AST 19 19  ALT 14 14  ALKPHOS 99 100  BILITOT 0.5 0.6  PROT 7.1 7.3  ALBUMIN 2.8* 2.9*   No results for input(s): LIPASE, AMYLASE in the last 168 hours. No results for input(s): AMMONIA in the last 168 hours. Coagulation Profile: Recent Labs  Lab 09/06/21 1708  INR 1.1   Cardiac  Enzymes: Recent Labs  Lab 09/06/21 1708  CKTOTAL 141   BNP (last 3 results) No results for input(s): PROBNP in the last 8760 hours. HbA1C: No results for input(s): HGBA1C in the last 72 hours.  CBG: No results for input(s): GLUCAP in the last 168 hours. Lipid Profile: No results for input(s): CHOL, HDL, LDLCALC, TRIG, CHOLHDL, LDLDIRECT in the last 72 hours. Thyroid Function Tests: No results for input(s): TSH, T4TOTAL, FREET4, T3FREE, THYROIDAB in the last 72 hours. Anemia Panel: No results for input(s): VITAMINB12, FOLATE, FERRITIN, TIBC, IRON, RETICCTPCT in the last 72 hours. Urine analysis:    Component Value Date/Time   COLORURINE YELLOW (A) 09/06/2021 1735   APPEARANCEUR CLEAR (A) 09/06/2021 1735   LABSPEC 1.024 09/06/2021 1735   PHURINE 5.0 09/06/2021 1735   GLUCOSEU >=500 (A) 09/06/2021 1735   HGBUR SMALL (A) 09/06/2021 1735   BILIRUBINUR NEGATIVE 09/06/2021 1735   KETONESUR NEGATIVE 09/06/2021 1735   PROTEINUR 30 (A) 09/06/2021 1735   UROBILINOGEN 0.2 07/31/2011 2217   NITRITE NEGATIVE 09/06/2021 1735   LEUKOCYTESUR NEGATIVE 09/06/2021 1735   Sepsis Labs: @LABRCNTIP (procalcitonin:4,lacticidven:4) )No results found for this or any previous visit (from the past 240 hour(s)).   Radiological Exams on Admission: DG Knee 2 Views Left  Result Date: 09/06/2021 CLINICAL DATA:  Cellulitis, knee infection, ulcer EXAM: LEFT KNEE - 1-2 VIEW COMPARISON:  None FINDINGS: Prior below the knee amputation. No bone destruction to suggest osteomyelitis. Enthesopathic changes noted along the anterior tibia and patella. No acute bony abnormality. Specifically, no fracture, subluxation, or dislocation. No joint effusion. IMPRESSION: Prior BKA.  No acute bony abnormality. Electronically Signed   By: Rolm Baptise M.D.   On: 09/06/2021 17:22   DG Chest Port 1 View  Result Date: 09/06/2021 CLINICAL DATA:  Questionable sepsis EXAM: PORTABLE CHEST 1 VIEW COMPARISON:  06/04/2020 FINDINGS:  Heart is normal size. Right lung clear. Questionable patchy opacity peripherally at the left lung base. No effusions. No acute bony abnormality. IMPRESSION: Questionable patchy density peripherally at the left lung base. Electronically Signed   By: Rolm Baptise M.D.   On: 09/06/2021 17:23      Assessment/Plan Principal Problem:   LLE Cellulitis Active Problems:   OBSTRUCTIVE SLEEP APNEA   Essential hypertension   Diabetic foot infection ([place])   DM type 2 with diabetic peripheral neuropathy (HCC)   Gastroesophageal reflux disease   Hyperlipidemia associated with type 2 diabetes mellitus (HCC)   Tobacco abuse disorder   Acute renal failure superimposed on stage 2 chronic kidney disease (HCC)   Hypokalemia   Hyponatremia     #1 left lower extremity cellulitis with wound infection: Patient will be admitted.  He has met slides sepsis criteria with elevated white count, respiratory rate being high and mildly elevated lactic acid.  We will therefore treat patient with vancomycin and cefepime for polymicrobial infection.  Repeat cultures.  His previous culture apparently was sensitive to Keflex.  Continue to monitor including blood cultures.  #2 diabetes type 2: Sliding scale insulin and supportive care.  #3 hyperlipidemia: Continue statin  #4 GERD: Continue with PPIs  #5 essential hypertension: Continue BP control.  #6 acute on chronic kidney disease stage II: Stable.  Continue to monitor  #7 hypokalemia: Severe.  Potassium 2.7.  Replete potassium  #8 hyponatremia: Hydrate and monitor  #9 obstructive sleep apnea, will offer CPAP at night.  #10 tobacco abuse: Counseling provided.  Nicotine patch    DVT prophylaxis: Heparin Code Status: Full code Family Communication: Wife at bedside Disposition Plan: Home Consults called: Wound care Admission status: Inpatient  Severity of Illness: The appropriate patient status for this patient is INPATIENT. Inpatient status is judged to  be reasonable and necessary in order to  provide the required intensity of service to ensure the patient's safety. The patient's presenting symptoms, physical exam findings, and initial radiographic and laboratory data in the context of their chronic comorbidities is felt to place them at high risk for further clinical deterioration. Furthermore, it is not anticipated that the patient will be medically stable for discharge from the hospital within 2 midnights of admission.   * I certify that at the point of admission it is my clinical judgment that the patient will require inpatient hospital care spanning beyond 2 midnights from the point of admission due to high intensity of service, high risk for further deterioration and high frequency of surveillance required.Barbette Merino MD [facility] Pager [phone]  If 7PM-7AM, please contact night-coverage [email or web address] Password [facility]  09/06/2021, 6:35 PM

## 2021-09-06 NOTE — Consult Note (Signed)
CODE SEPSIS - PHARMACY COMMUNICATION  **Broad Spectrum Antibiotics should be administered within 1 hour of Sepsis diagnosis**  Time Code Sepsis Called/Page Received: 1501  Antibiotics Ordered: ceftriaxone and vancomycin   Time of 1st antibiotic administration: 1730    Doloris Hall, PharmD Pharmacy Resident  09/06/2021 5:10 PM

## 2021-09-06 NOTE — Telephone Encounter (Signed)
That's fine.  The patient had a wound culture that showed it was susceptible to Keflex which is what the patient was taking.  He had a follow up on 08/22/2021 but he no showed this appointment.  We will evaluate when we are consulted.

## 2021-09-06 NOTE — ED Provider Notes (Signed)
[facility] Emergency Department Provider Note   ____________________________________________   Event Date/Time   First MD Initiated Contact with Patient 09/06/21 1647     (approximate)  I have reviewed the triage vital signs and the nursing notes.   HISTORY  Chief Complaint Wound Check    HPI Anes Rigel is a 45 y.o. male patient has been on doxycycline for approximately 6 weeks.  He started with some shallow ulcers on the left side of the knee.  He then developed some ulcers on the right side of the knee in the last few days the right side or medial part of the knee has gotten inflamed the ulcers have become purulent there is some black eschar present and streaking has gone up the leg and become very wide today.  Patient has a lot of pain but no fever or chills.  He is not sweaty.  He has a BKA on that side from Diabetic foot infection and got worse.  The stump itself looks good.         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    Patient Active Problem List   Diagnosis Date Noted   PAD (peripheral artery disease) ([place]) 05/26/2021   Acute renal failure superimposed on stage 2 chronic kidney disease ([place]) 04/03/2021   Syncope 04/03/2021   Neck pain    Abnormal weight loss 12/09/2020   Change in bowel habit 12/09/2020   Constipation 12/09/2020   Epigastric pain 12/09/2020   Drug-induced constipation 86/75/4492   Periumbilical pain 01/00/7121   Fatty liver 12/09/2020   S/P BKA (below knee amputation), left ([place]) 11/20/2020   Necrotizing fasciitis of ankle and foot ([place]) 11/07/2020   History of 2019 novel coronavirus disease (COVID-19) 09/01/2020   Major depressive disorder, recurrent, in remission ([place]) 09/01/2020   Pain management contract signed 09/07/2019   Evaluation by psychiatric service  required 08/18/2019   History of depression 08/18/2019   Primary osteoarthritis of both shoulders 07/16/2019   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) 07/16/2019   Chronic pain of both shoulders 07/16/2019   Cervical radicular pain 07/16/2019   S/P cervical spinal fusion 07/16/2019   Cervical spondylosis 07/16/2019   Cervical facet joint syndrome 07/16/2019   Chronic pain syndrome 07/16/2019   Anemia 05/25/2019   Osteomyelitis of right foot ([place]) 05/25/2019   Diabetic foot infection ([place]) 03/05/2019   Abscess 11/25/2018   Diabetic peripheral neuropathy associated with type 2 diabetes mellitus ([place]) 06/16/2018   Hyperlipidemia associated with type 2 diabetes mellitus ([place]) 06/16/2018   Intractable nausea and vomiting 10/21/2017   HNP (herniated nucleus pulposus), cervical 09/09/2017   Erectile dysfunction 07/31/2017   Gastroesophageal reflux disease 07/31/2017   Rotator cuff syndrome of left shoulder 07/31/2017   Cellulitis of right leg 07/30/2016   Tobacco abuse disorder 07/30/2016   Routine general medical examination at a health care facility 01/20/2014   Diabetic neuropathy, type I diabetes mellitus ([place]) 01/20/2014   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   HYPERLIPIDEMIA 03/17/2009   DM type 2 with diabetic peripheral neuropathy ([place]) 03/02/2009   Uncontrolled diabetes mellitus with hyperglycemia ([place]) 12/08/2008   OBSTRUCTIVE SLEEP APNEA 12/08/2008   Essential hypertension 12/08/2008   Allergic rhinitis 12/15/2007   Other malaise and fatigue 12/15/2007    Past Surgical History:  Procedure Laterality Date   AMPUTATION Left 11/07/2020  Procedure: 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;  Surgeon: Sharlotte Alamo, DPM;  Location: [facility];  Service: Podiatry;  Laterality: Left;   AMPUTATION Left 11/16/2020   Procedure: AMPUTATION BELOW KNEE;  Surgeon: Algernon Huxley, MD;  Location: [facility];  Service: Vascular;  Laterality: Left;   ANTERIOR CERVICAL DECOMP/DISCECTOMY  FUSION N/A 09/09/2017   Procedure: ANTERIOR CERVICAL DECOMPRESSION/DISCECTOMY FUSION CERVICAL 6- CERVICAL 7;  Surgeon: Ashok Pall, MD;  Location: [place];  Service: Neurosurgery;  Laterality: N/A;  ANTERIOR CERVICAL DECOMPRESSION/DISCECTOMY FUSION CERVICAL 6- CERVICAL 7   APPENDECTOMY     I & D EXTREMITY Right 10/03/2017   Procedure: IRRIGATION AND DEBRIDEMENT RIGHT WRIST;  Surgeon: Leanora Cover, MD;  Location: [place];  Service: Orthopedics;  Laterality: Right;   I & D EXTREMITY Right 11/26/2018   Procedure: IRRIGATION AND DEBRIDEMENT FASCIA ON RIGHT FOOT;  Surgeon: Samara Deist, DPM;  Location: [facility];  Service: Podiatry;  Laterality: Right;   INCISION AND DRAINAGE Right 03/06/2019   Procedure: INCISION AND DRAINAGE RIGHT FOOT, WITH 4th RAY AMPUTATION;  Surgeon: Samara Deist, DPM;  Location: [facility];  Service: Podiatry;  Laterality: Right;   INCISION AND DRAINAGE Left 11/07/2020   Procedure: INCISION AND DRAINAGE;  Surgeon: Sharlotte Alamo, DPM;  Location: [facility];  Service: Podiatry;  Laterality: Left;   IRRIGATION AND DEBRIDEMENT FOOT Left 11/11/2020   Procedure: IRRIGATION AND DEBRIDEMENT FOOT;  Surgeon: Sharlotte Alamo, DPM;  Location: [facility];  Service: Podiatry;  Laterality: Left;   METATARSAL HEAD EXCISION Right 05/15/2019   Procedure: OSTECTOMY;MET HEAD 5;  Surgeon: Samara Deist, DPM;  Location: [facility];  Service: Podiatry;  Laterality: Right;   osteomylitis     ROTATOR CUFF REPAIR Left     Prior to Admission medications   Medication Sig Start Date End Date Taking? Authorizing Provider  atorvastatin (LIPITOR) 40 MG tablet Take 40 mg by mouth daily.  08/23/17   [provider]  benazepril (LOTENSIN) 20 MG tablet Take 20 mg by mouth daily. 06/01/21   [provider]  busPIRone (BUSPAR) 15 MG tablet Take 15 mg by mouth 2 (two) times daily.    [provider]  [facility] SENNA 8.6 MG tablet Take 1 tablet by mouth 2 (two) times daily as needed for constipation. 10/31/17    [provider]  dicyclomine (BENTYL) 10 MG capsule Take 10 mg by mouth in the morning and at bedtime. 04/27/20   [provider]  famotidine (PEPCID) 20 MG tablet Take 20 mg by mouth daily.    [provider]  fenofibrate (TRICOR) 48 MG tablet Take 48 mg by mouth daily. 09/07/20   [provider]  FIASP 100 UNIT/ML SOLN Inject into the skin continuous. Via pump 10/09/20   [provider]  furosemide (LASIX) 40 MG tablet Take 1 tablet (40 mg total) by mouth daily. 04/10/21   Charlynne Cousins, MD  gabapentin (NEURONTIN) 100 MG capsule Take 200 mg by mouth 2 (two) times daily.    [provider]  gabapentin (NEURONTIN) 300 MG capsule Take 1 capsule (300 mg total) by mouth 2 (two) times daily with breakfast and lunch AND 2 capsules (600 mg total) at bedtime. Patient taking differently: Take 2 capsule (663m total) by mouth in the morning and 2 capsules in the evening 01/20/21   BKris Hartmann NP  linaclotide ([facility] 72 MCG capsule Take 72 mcg by mouth daily as needed (constipation).    [provider]  methocarbamol (ROBAXIN) 500 MG  tablet Take 2 tablets by mouth 2 (two) times daily as needed for muscle spasms. 03/17/21   [provider]  metoCLOPramide (REGLAN) 10 MG tablet Take 10 mg by mouth daily. 05/25/19   [provider]  ondansetron (ZOFRAN) 8 MG tablet Take 8 mg by mouth every 6 (six) hours as needed for nausea/vomiting.    [provider]  oxyCODONE (OXY IR/ROXICODONE) 5 MG immediate release tablet Take 1 tablet (5 mg total) by mouth every 4 (four) hours as needed for moderate pain or severe pain. 11/21/20   Stegmayer, Joelene Millin A, PA-C  pantoprazole (PROTONIX) 40 MG tablet Take 40 mg by mouth daily. 08/23/17   [provider]  POTASSIUM PO Take 10 mEq by mouth daily.    [provider]  sertraline (ZOLOFT) 100 MG tablet Take 100 mg by mouth daily.  10/31/17   [provider]   sildenafil (REVATIO) 20 MG tablet Take 100 mg by mouth daily as needed (erectile dysfunction).    [provider]  traZODone (DESYREL) 100 MG tablet Take 100 mg by mouth at bedtime.    [provider]  vitamin B-12 (CYANOCOBALAMIN) 1000 MCG tablet Take 1,000 mcg by mouth in the morning and at bedtime.  01/27/19   [provider]    Allergies Patient has no known allergies.  Family History  Problem Relation Age of Onset   Diabetes Mother    Heart disease Father    Diabetes Father    Arthritis Other    Hyperlipidemia Other    Hypertension Other    Cancer Other        breast   Mental illness Neg Hx     Social History Social History   Tobacco Use   Smoking status: Former    Packs/day: 1.00    Years: 17.00    Pack years: 17.00    Types: Cigarettes    Quit date: 01/14/2019    Years since quitting: 2.6   Smokeless tobacco: Never  Vaping Use   Vaping Use: Never used  Substance Use Topics   Alcohol use: Yes    Comment: rare   Drug use: No    Review of Systems  Constitutional: No fever/chills Eyes: No visual changes. ENT: No sore throat. Cardiovascular: Denies chest pain. Respiratory: Denies shortness of breath. Gastrointestinal: No abdominal pain.  No nausea, no vomiting.  No diarrhea.  No constipation. Genitourinary: Negative for dysuria. Musculoskeletal: Negative for back pain. Skin: Negative for rash. Neurological: Negative for headaches, focal weakness ____________________________________________   PHYSICAL EXAM:  VITAL SIGNS: ED Triage Vitals  Enc Vitals Group     BP 09/06/21 1532 (!) 142/92     Pulse Rate 09/06/21 1532 95     Resp 09/06/21 1532 16     Temp 09/06/21 1532 98.1 F (36.7 C)     Temp Source 09/06/21 1532 Oral     SpO2 09/06/21 1532 98 %     Weight 09/06/21 1533 200 lb (90.7 kg)     Height 09/06/21 1533 _0  (1.778 m)     Head Circumference --      Peak Flow --      Pain Score 09/06/21 1532 9     Pain Loc --       Pain Edu? --      Excl. in [place]? --     Constitutional: Alert and oriented. Well appearing and in no acute distress. Eyes: Conjunctivae are normal. PER Head: Atraumatic. Nose: No congestion/rhinnorhea. Mouth/Throat: Mucous  membranes are moist.  Oropharynx non-erythematous. Neck: No stridor.  Cardiovascular: Normal rate, regular rhythm. Grossly normal heart sounds.  Good peripheral circulation. Respiratory: Normal respiratory effort.  No retractions. Lungs CTAB. Gastrointestinal: Soft and nontender. No distention. No abdominal bruits.  Musculoskeletal: Right leg is normal left leg as described in HPI Neurologic:  Normal speech and language. No gross focal neurologic deficits are appreciated.  Skin: Skin is normal except as noted above   ____________________________________________   LABS (all labs ordered are listed, but only abnormal results are displayed)  Labs Reviewed  CBC - Abnormal; Notable for the following components:      Result Value   WBC 13.6 (*)    RBC 3.71 (*)    Hemoglobin 10.6 (*)    HCT 30.6 (*)    All other components within normal limits  COMPREHENSIVE METABOLIC PANEL - Abnormal; Notable for the following components:   Sodium 132 (*)    Potassium 3.0 (*)    CO2 21 (*)    Glucose, Bld 469 (*)    BUN 30 (*)    Creatinine, Ser 1.92 (*)    Albumin 2.8 (*)    GFR, Estimated 43 (*)    All other components within normal limits  LACTIC ACID, PLASMA - Abnormal; Notable for the following components:   Lactic Acid, Venous 2.4 (*)    All other components within normal limits  SEDIMENTATION RATE - Abnormal; Notable for the following components:   Sed Rate 82 (*)    All other components within normal limits  LACTIC ACID, PLASMA - Abnormal; Notable for the following components:   Lactic Acid, Venous 2.1 (*)    All other components within normal limits  COMPREHENSIVE METABOLIC PANEL - Abnormal; Notable for the following components:   Sodium 129 (*)     Potassium 2.7 (*)    CO2 21 (*)    Glucose, Bld 454 (*)    BUN 32 (*)    Creatinine, Ser 1.77 (*)    Calcium 8.8 (*)    Albumin 2.9 (*)    GFR, Estimated 48 (*)    All other components within normal limits  CBC WITH DIFFERENTIAL/PLATELET - Abnormal; Notable for the following components:   WBC 13.9 (*)    RBC 3.72 (*)    Hemoglobin 10.7 (*)    HCT 30.9 (*)    Neutro Abs 11.6 (*)    Abs Immature Granulocytes 0.09 (*)    All other components within normal limits  URINALYSIS, COMPLETE (UACMP) WITH MICROSCOPIC - Abnormal; Notable for the following components:   Color, Urine YELLOW (*)    APPearance CLEAR (*)    Glucose, UA >=500 (*)    Hgb urine dipstick SMALL (*)    Protein, ur 30 (*)    All other components within normal limits  RESP PANEL BY RT-PCR (FLU A&B, COVID) ARPGX2  CULTURE, BLOOD (ROUTINE X 2)  CULTURE, BLOOD (ROUTINE X 2)  URINE CULTURE  AEROBIC CULTURE W GRAM STAIN (SUPERFICIAL SPECIMEN)  PROTIME-INR  APTT  CK  LACTIC ACID, PLASMA  LACTIC ACID, PLASMA   ____________________________________________  EKG   ____________________________________________  RADIOLOGY Gertha Calkin, personally viewed and evaluated these images (plain radiographs) as part of my medical decision making, as well as reviewing the written report by the radiologist.  ED MD interpretation:    Official radiology report(s): DG Knee 2 Views Left  Result Date: 09/06/2021 CLINICAL DATA:  Cellulitis, knee infection, ulcer EXAM: LEFT KNEE - 1-2  VIEW COMPARISON:  None FINDINGS: Prior below the knee amputation. No bone destruction to suggest osteomyelitis. Enthesopathic changes noted along the anterior tibia and patella. No acute bony abnormality. Specifically, no fracture, subluxation, or dislocation. No joint effusion. IMPRESSION: Prior BKA.  No acute bony abnormality. Electronically Signed   By: Rolm Baptise M.D.   On: 09/06/2021 17:22   DG Chest Port 1 View  Result Date:  09/06/2021 CLINICAL DATA:  Questionable sepsis EXAM: PORTABLE CHEST 1 VIEW COMPARISON:  06/04/2020 FINDINGS: Heart is normal size. Right lung clear. Questionable patchy opacity peripherally at the left lung base. No effusions. No acute bony abnormality. IMPRESSION: Questionable patchy density peripherally at the left lung base. Electronically Signed   By: Rolm Baptise M.D.   On: 09/06/2021 17:23    ____________________________________________   PROCEDURES  Procedure(s) performed (including Critical Care):  Procedures   ____________________________________________   INITIAL IMPRESSION / ASSESSMENT AND PLAN / ED COURSE         Patient with recurrent cellulitis this time worsening in spite of treatment with doxycycline.  He has an elevated lactic acid and white count and a lot of pain.  We will have to admit him for IV antibiotics.  He reports and his wife reports also that vancomycin is about the only thing that works on him.  I will start that and Rocephin in accordance with the cellulitis/sepsis order set.  Patient with obvious cellulitis labs consistent with such.  Likely has early sepsis.  His sed rate is also elevated.  We will get him in the hospital continue his antibiotics and monitor very closely.  We will follow his sugars closely as well if they do not go down after fluids we will begin insulin.   ____________________________________________   FINAL CLINICAL IMPRESSION(S) / ED DIAGNOSES  Final diagnoses:  Cellulitis of left lower extremity without foot     ED Discharge Orders     None        Note:  This document was prepared using Dragon voice recognition software and may include unintentional dictation errors.    Nena Polio, MD 09/06/21 [phone]

## 2021-09-06 NOTE — Progress Notes (Signed)
Pharmacy Antibiotic Note  Richard Mendoza is a 45 y.o. male with a h/o diabetes and BKA admitted on 09/06/2021 with recurrent cellulitis that failed outpatient doxycycline.  Pharmacy has been consulted for vancomycin and cefepime dosing. He received 1000 mg IV vancomycin and 2 grams ceftriaxone in the ED. His creatinine is elevated but it is difficult to determine his baseline. When he was discharged from [facility] in May his creatinine was approximately what it is today  Plan:  1) start cefepime 2 grams IV every 8 hours  2) give an additional vancomycin 1000 mg IV x 1 to complete loading dose, then start 1500 mg IV every 24 hours  Goal AUC 400-550 Expected AUC: 463 SCr used: 1.77 mg/dL Ke: 4.765 h-1, Y6/5: 14 hours Daily SCr while on IV vancomycin  Height: 5\' 10"  (177.8 cm) Weight: 90.7 kg (200 lb) IBW/kg (Calculated) : 73  Temp (24hrs), Avg:98.1 F (36.7 C), Min:98.1 F (36.7 C), Max:98.1 F (36.7 C)  Recent Labs  Lab 09/06/21 1536 09/06/21 1708  WBC 13.6* 13.9*  CREATININE 1.92* 1.77*  LATICACIDVEN 2.4* 2.1*    Estimated Creatinine Clearance: 59.7 mL/min (A) (by C-G formula based on SCr of 1.77 mg/dL (H)).    No Known Allergies  Antimicrobials this admission: ceftriaxone 10/19 x 1 vancomycin 10/19 >>  cefepime 10/19 >>   Microbiology results: 10/19 BCx: pending 10/19 UCx: pending  10/19 WCx: pending 10/19 SARS CoV-2: negative 10/19 influenza A/B: negative   Thank you for allowing pharmacy to be a part of this patient's care.  11/19 09/06/2021 6:47 PM

## 2021-09-06 NOTE — ED Provider Notes (Signed)
Emergency Medicine Provider Triage Evaluation Note  Richard Mendoza, a 45 y.o. male  was evaluated in triage.  Pt complains of left lower extremity cellulitis.  Patient with a B-K amputation on left leg, presents with progression of wounds to the medial aspect of the lower leg.  He started on a leftover course of doxycycline for his symptoms.  He would note initially an injury on the left side of the leg about 6 weeks prior.  Symptoms have progressed with streaking up the inner thigh over the last week.  Not endorse any fevers or chills or sweats.  Review of Systems  Positive: LLE cellulitis, wound Negative: FCS  Physical Exam  BP (!) 142/92 (BP Location: Left Arm)   Pulse 95   Temp 98.1 F (36.7 C) (Oral)   Resp 16   Ht 5\' 10"  (1.778 m)   Wt 90.7 kg   SpO2 98%   BMI 28.70 kg/m  Gen:   Awake, no distress  NAD Resp:  Normal effort CTA MSK:   Moves extremities without difficulty LLE BKA with cellulitis Other:  CVS: RRR  Medical Decision Making  Medically screening exam initiated at 3:56 PM.  Appropriate orders placed.  [place] was informed that the remainder of the evaluation will be completed by another provider, this initial triage assessment does not replace that evaluation, and the importance of remaining in the ED until their evaluation is complete.  Patient presents with aggressive cellulitis to his left lower extremity BKA.   Casimiro Needle, PA-C 09/06/21 1557    09/08/21, MD 09/06/21 1650

## 2021-09-06 NOTE — ED Notes (Signed)
Critical Lab Potassium 2.7 Dr Darnelle Catalan notified.

## 2021-09-06 NOTE — ED Triage Notes (Signed)
Pt has been taking 200mg  of doxycline tid for a wound infection to the left knee wound, pt has a hx of staph infections, pt states that his prosthesis is new this year and states that it is oozing

## 2021-09-07 ENCOUNTER — Encounter: Payer: Self-pay | Admitting: Internal Medicine

## 2021-09-07 LAB — GLUCOSE, CAPILLARY
Glucose-Capillary: 72 mg/dL (ref 70–99)
Glucose-Capillary: 168 mg/dL — ABNORMAL HIGH (ref 70–99)
Glucose-Capillary: 85 mg/dL (ref 70–99)
Glucose-Capillary: 64 mg/dL — ABNORMAL LOW (ref 70–99)
Glucose-Capillary: 84 mg/dL (ref 70–99)

## 2021-09-07 LAB — COMPREHENSIVE METABOLIC PANEL
ALT: 12 U/L (ref 0–44)
AST: 13 U/L — ABNORMAL LOW (ref 15–41)
Albumin: 2.3 g/dL — ABNORMAL LOW (ref 3.5–5.0)
Alkaline Phosphatase: 75 U/L (ref 38–126)
Anion gap: 6 (ref 5–15)
BUN: 24 mg/dL — ABNORMAL HIGH (ref 6–20)
CO2: 21 mmol/L — ABNORMAL LOW (ref 22–32)
Calcium: 8.6 mg/dL — ABNORMAL LOW (ref 8.9–10.3)
Chloride: 108 mmol/L (ref 98–111)
Creatinine, Ser: 1.05 mg/dL (ref 0.61–1.24)
GFR, Estimated: >60 mL/min (ref >60)
Glucose, Bld: 100 mg/dL — ABNORMAL HIGH (ref 70–99)
Potassium: 3.3 mmol/L — ABNORMAL LOW (ref 3.5–5.1)
Sodium: 135 mmol/L (ref 135–145)
Total Bilirubin: 0.3 mg/dL (ref 0.3–1.2)
Total Protein: 6.2 g/dL — ABNORMAL LOW (ref 6.5–8.1)

## 2021-09-07 LAB — CBC
HCT: 28.4 % — ABNORMAL LOW (ref 39.0–52.0)
Hemoglobin: 9.6 g/dL — ABNORMAL LOW (ref 13.0–17.0)
MCH: 28.2 pg (ref 26.0–34.0)
MCHC: 33.8 g/dL (ref 30.0–36.0)
MCV: 83.3 fL (ref 80.0–100.0)
Platelets: 300 10*3/uL (ref 150–400)
RBC: 3.41 MIL/uL — ABNORMAL LOW (ref 4.22–5.81)
RDW: 14.6 % (ref 11.5–15.5)
WBC: 11.8 10*3/uL — ABNORMAL HIGH (ref 4.0–10.5)
nRBC: 0 % (ref 0.0–0.2)

## 2021-09-07 LAB — HEMOGLOBIN A1C
Hgb A1c MFr Bld: 14.6 % — ABNORMAL HIGH (ref 4.8–5.6)
Mean Plasma Glucose: 372 mg/dL

## 2021-09-07 MED ORDER — ATORVASTATIN CALCIUM 20 MG PO TABS
40.0000 mg | ORAL_TABLET | Freq: Every day | ORAL | Status: DC
  Administered 2021-09-07 – 2021-09-08 (×2): 40 mg via ORAL
  Filled 2021-09-07 (×2): qty 2

## 2021-09-07 MED ORDER — DICYCLOMINE HCL 10 MG PO CAPS
10.0000 mg | ORAL_CAPSULE | Freq: Three times a day (TID) | ORAL | Status: DC
  Administered 2021-09-08 – 2021-09-09 (×4): 10 mg via ORAL
  Filled 2021-09-07 (×9): qty 1

## 2021-09-07 MED ORDER — SERTRALINE HCL 50 MG PO TABS
100.0000 mg | ORAL_TABLET | Freq: Every day | ORAL | Status: DC
  Administered 2021-09-07 – 2021-09-09 (×3): 100 mg via ORAL
  Filled 2021-09-07 (×3): qty 2

## 2021-09-07 MED ORDER — LINACLOTIDE 72 MCG PO CAPS
72.0000 ug | ORAL_CAPSULE | Freq: Every day | ORAL | Status: DC | PRN
  Filled 2021-09-07: qty 1

## 2021-09-07 MED ORDER — GABAPENTIN 300 MG PO CAPS: 300.0000 mg | ORAL_CAPSULE | Freq: Every day | ORAL | Status: DC

## 2021-09-07 MED ORDER — PANTOPRAZOLE SODIUM 40 MG PO TBEC
40.0000 mg | DELAYED_RELEASE_TABLET | Freq: Every day | ORAL | Status: DC
  Administered 2021-09-07 – 2021-09-09 (×3): 40 mg via ORAL
  Filled 2021-09-07 (×3): qty 1

## 2021-09-07 MED ORDER — FAMOTIDINE 20 MG PO TABS
20.0000 mg | ORAL_TABLET | Freq: Every day | ORAL | Status: DC
  Administered 2021-09-07 – 2021-09-09 (×3): 20 mg via ORAL
  Filled 2021-09-07 (×3): qty 1

## 2021-09-07 MED ORDER — BUSPIRONE HCL 10 MG PO TABS
15.0000 mg | ORAL_TABLET | Freq: Two times a day (BID) | ORAL | Status: DC
  Administered 2021-09-07 – 2021-09-09 (×4): 15 mg via ORAL
  Filled 2021-09-07 (×4): qty 2

## 2021-09-07 MED ORDER — METHOCARBAMOL 500 MG PO TABS
1000.0000 mg | ORAL_TABLET | Freq: Two times a day (BID) | ORAL | Status: DC | PRN
  Administered 2021-09-08 – 2021-09-09 (×2): 1000 mg via ORAL
  Filled 2021-09-07 (×2): qty 2

## 2021-09-07 MED ORDER — VANCOMYCIN HCL 1250 MG/250ML IV SOLN
1250.0000 mg | Freq: Two times a day (BID) | INTRAVENOUS | Status: DC
  Administered 2021-09-07 – 2021-09-09 (×4): 1250 mg via INTRAVENOUS
  Filled 2021-09-07 (×6): qty 250

## 2021-09-07 MED ORDER — HYDROMORPHONE HCL 1 MG/ML IJ SOLN
1.0000 mg | INTRAMUSCULAR | Status: DC | PRN
  Administered 2021-09-07 – 2021-09-09 (×10): 1 mg via INTRAVENOUS
  Filled 2021-09-07 (×10): qty 1

## 2021-09-07 MED ORDER — GABAPENTIN 600 MG PO TABS
300.0000 mg | ORAL_TABLET | Freq: Three times a day (TID) | ORAL | Status: DC
  Administered 2021-09-07 – 2021-09-09 (×5): 300 mg via ORAL
  Filled 2021-09-07 (×5): qty 1

## 2021-09-07 MED ORDER — ENOXAPARIN SODIUM 40 MG/0.4ML IJ SOSY
40.0000 mg | PREFILLED_SYRINGE | INTRAMUSCULAR | Status: DC
  Administered 2021-09-07 – 2021-09-08 (×2): 40 mg via SUBCUTANEOUS
  Filled 2021-09-07 (×2): qty 0.4

## 2021-09-07 MED ORDER — MORPHINE SULFATE 15 MG PO TABS
15.0000 mg | ORAL_TABLET | ORAL | Status: DC | PRN
  Administered 2021-09-08 (×2): 15 mg via ORAL
  Filled 2021-09-07 (×3): qty 1

## 2021-09-07 MED ORDER — VITAMIN B-12 1000 MCG PO TABS
1000.0000 ug | ORAL_TABLET | Freq: Every day | ORAL | Status: DC
  Administered 2021-09-07 – 2021-09-09 (×3): 1000 ug via ORAL
  Filled 2021-09-07 (×3): qty 1

## 2021-09-07 MED ORDER — METOCLOPRAMIDE HCL 10 MG PO TABS
10.0000 mg | ORAL_TABLET | Freq: Every day | ORAL | Status: DC
  Administered 2021-09-07 – 2021-09-09 (×3): 10 mg via ORAL
  Filled 2021-09-07 (×3): qty 1

## 2021-09-07 MED ORDER — TRAZODONE HCL 100 MG PO TABS
100.0000 mg | ORAL_TABLET | Freq: Every day | ORAL | Status: DC
  Administered 2021-09-07 – 2021-09-08 (×2): 100 mg via ORAL
  Filled 2021-09-07 (×2): qty 1

## 2021-09-07 NOTE — Progress Notes (Signed)
Pharmacy Antibiotic Note  Richard Mendoza is a 45 y.o. male with a h/o diabetes and BKA admitted on 09/06/2021 with recurrent cellulitis that failed outpatient doxycycline.  Pharmacy has been consulted for vancomycin and cefepime dosing. He received 1000 mg IV vancomycin and 2 grams ceftriaxone in the ED. His creatinine is elevated but it is difficult to determine his baseline. When he was discharged from [facility] in May his creatinine was approximately what it is today  Antibiotics Day 2  Plan:  1) start cefepime 2 grams IV every 8 hours  2) Will adjust dose to Vancomycin 1250mg  Q12 hours as renal function has improved. Goal AUC 400-550 Expected AUC: 475 Expected Css: 13.3 SCr used: 1.05 mg/dL Daily SCr while on IV vancomycin  Height: 5\' 10"  (177.8 cm) Weight: 90.7 kg (200 lb) IBW/kg (Calculated) : 73  Temp (24hrs), Avg:98.1 F (36.7 C), Min:97.8 F (36.6 C), Max:98.3 F (36.8 C)  Recent Labs  Lab 09/06/21 1536 09/06/21 1708 09/06/21 1846 09/06/21 2133 09/07/21 0605  WBC 13.6* 13.9*  --   --  11.8*  CREATININE 1.92* 1.77*  --   --  1.05  LATICACIDVEN 2.4* 2.1* 1.8 1.6  --      Estimated Creatinine Clearance: 100.7 mL/min (by C-G formula based on SCr of 1.05 mg/dL).    No Known Allergies  Antimicrobials this admission: ceftriaxone 10/19 x 1 vancomycin 10/19 >>  cefepime 10/19 >>   Microbiology results: 10/19 BCx: NG<24 hours 10/19 UCx: pending  10/19 WCx: moderate GPC 10/19 SARS CoV-2: negative 10/19 influenza A/B: negative   Thank you for allowing pharmacy to be a part of this patient's care.  Shade Kaley A Taj Nevins 09/07/2021 1:34 PM

## 2021-09-07 NOTE — Plan of Care (Signed)

## 2021-09-07 NOTE — Progress Notes (Addendum)
PROGRESS NOTE    Richard Mendoza  LFY:[id number] DOB: 27-Jul-1976 DOA: 09/06/2021 PCP: Barbette Reichmann, MD    Brief Narrative:  [name] was admitted to the hospital with the working diagnosis of left lower extremity cellulitis.   45 yo male with past medical history of peripheral vascular disease. left BKA, T2DM, HTN, dyslipidemia, CKD and GERD who presented with left leg wound and erythema. Has a wound at the stump site, where prosthesis was placed, followed by vascular surgery. Because worsening erythema he presented to the hospital. On his initial physical examination patient not febrile, blood pressure 189/98, RR 22 and HR 95. Dry mucous membranes, lungs clear to auscultation, heart S1 and S2 present, abdomen soft. Left stump wound with purulent drainage.   NA 132, K 3,0, CL 102, bicarbonate 21, glucose 469, BUN 32 and cr 1,92.  Wbc 13,9, hgb 10,7, hct 30,9 and plt 322 SARS COVID 19 negative  UA specific gravity 1,024   Chest radiograph with no infiltrates.    Assessment & Plan:   Principal Problem:   LLE Cellulitis Active Problems:   OBSTRUCTIVE SLEEP APNEA   Essential hypertension   Diabetic foot infection (HCC)   DM type 2 with diabetic peripheral neuropathy (HCC)   Gastroesophageal reflux disease   Hyperlipidemia associated with type 2 diabetes mellitus (HCC)   Tobacco abuse disorder   Acute renal failure superimposed on stage 2 chronic kidney disease (HCC)   Hypokalemia   Hyponatremia   Left lower extremity cellulitis, at the knee above stump. Positive pain and purulence. No fevers Wbc is 11.8 and cultures with no growth.  Continue antibiotic therapy with cefepime and vancomycin Consult wound care team for dressing changes. Continue pain control with hydromorphone, increased from q 3 hrs to q 2 hrs and will add oral morphine. (Patient wants to avoid oxycodone or hydrocodone because not tolerated well in the past with confusion).   Follow cell count,  temperature curve and cultures.   2. T2DM/ dylipidemia. Fasting glucose this am 100, will continue insulin therapy for glucose cover and monitoring.  Patient is tolerating po well.   Continue with statin therapy.  Diabetic neuropathy resume gabapentin.   3. HTN. Continue blood pressure monitoring, hold on ACE inh for now due to risk of hypotension.   4. AKI on ckd stage 2. Renal function with serum cr at 1,0 with K at 3,3 and serum bicarbonate at 21. Continue close follow up of renal function and replete K with Kcl. Discontinue IV fluids.   5. Depression. Continue buspirone, sertraline and trazodone.    Patient continue to be at high risk for worsening wound   Status is: Inpatient  Remains inpatient appropriate because: iv antibiotic therapy        DVT prophylaxis: Enoxaparin   Code Status:    full  Family Communication:   No family at the bedside     Antimicrobials:  Vancomycin and cefepime    Subjective: Patient with positive pain at the left knee, moderate to severe, transient improvement with IV hydromorphone, no nausea or vomiting,   Objective: Vitals:   09/06/21 2358 09/07/21 0504 09/07/21 0818 09/07/21 1235  BP: (!) 144/79 125/74 126/76 126/81  Pulse: 78 74 77 76  Resp: 17 16  16   Temp: 98.1 F (36.7 C) 98.3 F (36.8 C) 98.2 F (36.8 C) 98.1 F (36.7 C)  TempSrc: Oral Oral    SpO2: 100% 94% 99% 100%  Weight:      Height:  Intake/Output Summary (Last 24 hours) at 09/07/2021 1546 Last data filed at 09/07/2021 1422 Gross per 24 hour  Intake 3604.55 ml  Output --  Net 3604.55 ml   Filed Weights   09/06/21 1533  Weight: 90.7 kg    Examination:   General: Not in pain or dyspnea,  Neurology: Awake and alert, non focal  E ENT: no pallor, no icterus, oral mucosa moist Cardiovascular: No JVD. S1-S2 present, rhythmic, no gallops, rubs, or murmurs. No lower extremity edema. Pulmonary: vesicular breath sounds bilaterally, adequate air  movement, no wheezing, rhonchi or rales. Gastrointestinal. Abdomen soft and non tender Skin. Left knee with large ulcerated wound at the medical knee, with positive purulent material, positive peripheral erythema, lateral knee with 2 ulcerated wounds.  Musculoskeletal: left BKA  Lateral    Medial      Data Reviewed: I have personally reviewed following labs and imaging studies  CBC: Recent Labs  Lab 09/06/21 1536 09/06/21 1708 09/07/21 0605  WBC 13.6* 13.9* 11.8*  NEUTROABS  --  11.6*  --   HGB 10.6* 10.7* 9.6*  HCT 30.6* 30.9* 28.4*  MCV 82.5 83.1 83.3  PLT 309 322 300   Basic Metabolic Panel: Recent Labs  Lab 09/06/21 1536 09/06/21 1708 09/07/21 0605  NA 132* 129* 135  K 3.0* 2.7* 3.3*  CL 102 99 108  CO2 21* 21* 21*  GLUCOSE 469* 454* 100*  BUN 30* 32* 24*  CREATININE 1.92* 1.77* 1.05  CALCIUM 9.0 8.8* 8.6*   GFR: Estimated Creatinine Clearance: 100.7 mL/min (by C-G formula based on SCr of 1.05 mg/dL). Liver Function Tests: Recent Labs  Lab 09/06/21 1536 09/06/21 1708 09/07/21 0605  AST 19 19 13*  ALT 14 14 12   ALKPHOS 99 100 75  BILITOT 0.5 0.6 0.3  PROT 7.1 7.3 6.2*  ALBUMIN 2.8* 2.9* 2.3*   No results for input(s): LIPASE, AMYLASE in the last 168 hours. No results for input(s): AMMONIA in the last 168 hours. Coagulation Profile: Recent Labs  Lab 09/06/21 1708  INR 1.1   Cardiac Enzymes: Recent Labs  Lab 09/06/21 1708  CKTOTAL 141   BNP (last 3 results) No results for input(s): PROBNP in the last 8760 hours. HbA1C: No results for input(s): HGBA1C in the last 72 hours. CBG: Recent Labs  Lab 09/06/21 2339 09/07/21 0740 09/07/21 1131  GLUCAP 286* 72 168*   Lipid Profile: No results for input(s): CHOL, HDL, LDLCALC, TRIG, CHOLHDL, LDLDIRECT in the last 72 hours. Thyroid Function Tests: No results for input(s): TSH, T4TOTAL, FREET4, T3FREE, THYROIDAB in the last 72 hours. Anemia Panel: No results for input(s): VITAMINB12,  FOLATE, FERRITIN, TIBC, IRON, RETICCTPCT in the last 72 hours.    Radiology Studies: I have reviewed all of the imaging during this hospital visit personally     Scheduled Meds:  heparin  5,000 Units Subcutaneous Q8H   insulin aspart  0-20 Units Subcutaneous TID WC   insulin aspart  0-5 Units Subcutaneous QHS   Continuous Infusions:  0.9 % NaCl with KCl 40 mEq / L 125 mL/hr at 09/07/21 0606   ceFEPime (MAXIPIME) IV 2 g (09/07/21 1458)   vancomycin       LOS: 1 day        Cozette Braggs 09/09/21, MD

## 2021-09-08 DIAGNOSIS — A419 Sepsis, unspecified organism: Secondary | ICD-10-CM | POA: Diagnosis present

## 2021-09-08 DIAGNOSIS — D638 Anemia in other chronic diseases classified elsewhere: Secondary | ICD-10-CM

## 2021-09-08 LAB — BASIC METABOLIC PANEL
Anion gap: 3 — ABNORMAL LOW (ref 5–15)
BUN: 18 mg/dL (ref 6–20)
CO2: 22 mmol/L (ref 22–32)
Calcium: 8.5 mg/dL — ABNORMAL LOW (ref 8.9–10.3)
Chloride: 110 mmol/L (ref 98–111)
Creatinine, Ser: 1.19 mg/dL (ref 0.61–1.24)
GFR, Estimated: >60 mL/min (ref >60)
Glucose, Bld: 86 mg/dL (ref 70–99)
Potassium: 2.9 mmol/L — ABNORMAL LOW (ref 3.5–5.1)
Sodium: 135 mmol/L (ref 135–145)

## 2021-09-08 LAB — CBC
HCT: 26.9 % — ABNORMAL LOW (ref 39.0–52.0)
Hemoglobin: 8.9 g/dL — ABNORMAL LOW (ref 13.0–17.0)
MCH: 28 pg (ref 26.0–34.0)
MCHC: 33.1 g/dL (ref 30.0–36.0)
MCV: 84.6 fL (ref 80.0–100.0)
Platelets: 294 10*3/uL (ref 150–400)
RBC: 3.18 MIL/uL — ABNORMAL LOW (ref 4.22–5.81)
RDW: 14.9 % (ref 11.5–15.5)
WBC: 10.8 10*3/uL — ABNORMAL HIGH (ref 4.0–10.5)
nRBC: 0 % (ref 0.0–0.2)

## 2021-09-08 LAB — URINE CULTURE

## 2021-09-08 LAB — GLUCOSE, CAPILLARY
Glucose-Capillary: 61 mg/dL — ABNORMAL LOW (ref 70–99)
Glucose-Capillary: 84 mg/dL (ref 70–99)
Glucose-Capillary: 65 mg/dL — ABNORMAL LOW (ref 70–99)
Glucose-Capillary: 66 mg/dL — ABNORMAL LOW (ref 70–99)
Glucose-Capillary: 110 mg/dL — ABNORMAL HIGH (ref 70–99)
Glucose-Capillary: 123 mg/dL — ABNORMAL HIGH (ref 70–99)
Glucose-Capillary: 159 mg/dL — ABNORMAL HIGH (ref 70–99)
Glucose-Capillary: 98 mg/dL (ref 70–99)

## 2021-09-08 LAB — MAGNESIUM: Magnesium: 1.7 mg/dL (ref 1.7–2.4)

## 2021-09-08 MED ORDER — INSULIN PUMP
Freq: Three times a day (TID) | SUBCUTANEOUS | Status: DC
  Filled 2021-09-08: qty 1

## 2021-09-08 MED ORDER — POTASSIUM CHLORIDE 10 MEQ/100ML IV SOLN
10.0000 meq | INTRAVENOUS | Status: AC
Start: 2021-09-08 — End: 2021-09-08
  Administered 2021-09-08 (×4): 10 meq via INTRAVENOUS
  Filled 2021-09-08 (×4): qty 100

## 2021-09-08 MED ORDER — POTASSIUM CHLORIDE CRYS ER 20 MEQ PO TBCR
40.0000 meq | EXTENDED_RELEASE_TABLET | Freq: Once | ORAL | Status: AC
  Administered 2021-09-08: 40 meq via ORAL
  Filled 2021-09-08: qty 2

## 2021-09-08 MED ORDER — COLLAGENASE 250 UNIT/GM EX OINT
TOPICAL_OINTMENT | Freq: Every day | CUTANEOUS | Status: DC
  Filled 2021-09-08: qty 30

## 2021-09-08 NOTE — Consult Note (Addendum)
WOC Nurse Consult Note: Consult requested to provide topical treatment for left stump.  Performed remotely after review of progress notes and photos in the EMR.  Wound type: Pt has a BKA and cellulitis extending above the stump to the entire thigh, with generalized edema and erythremia. Left inner and outer knee areas with necrotic full thickness wounds with extensive amt slough and eschar and mod amt purulent drainage, according to progress notes. Pt has been  followed by the Vascular team prior to admission.  Dressing procedure/placement/frequency: Topical treatment orders provided for bedside nurses to perform to provide enzymatic debridement as follows: apply Santyl to left leg wounds Q day, then cover with moist gauze and then ABD pads and kerlex Secure chat message sent to the primary team: Due to the extensive amt of slough and eschar, this patient could benefit from possible debridement of the wounds.  A vascular consult is pending. Please refer to their team for further plan of care. Please re-consult if further assistance is needed.  Thank-you,  Cammie Mcgee MSN, RN, CWOCN, [place], CNS [phone]

## 2021-09-08 NOTE — TOC Progression Note (Signed)
Transition of Care [facility]) - Progression Note    Patient Details  Name: Richard Mendoza MRN: [id number] Date of Birth: 03-20-1976  Transition of Care [facility]) CM/SW [place], RN Phone Number: 09/08/2021, 1:34 PM  Clinical Narrative:   Met with the patient in the room to talk about DC plan and needs He lives at home with his sin and wife, he has a wheelchair at home, his son will provide transportation home, he has a prosthetic but with the irritated stump not going to be able to use it until it heals, He stated that he will call [facility] Wound care to get an appointment on Monday if they do not call him, he stated that he has no additional needs         Expected Discharge Plan and Services                                                 Social Determinants of Health (SDOH) Interventions    Readmission Risk Interventions Readmission Risk Prevention Plan 03/10/2019  Medication Screening Complete  Transportation Screening Complete  Some recent data might be hidden

## 2021-09-08 NOTE — Assessment & Plan Note (Signed)
Resolved

## 2021-09-08 NOTE — Assessment & Plan Note (Signed)
Continue pantoprazole. °

## 2021-09-08 NOTE — Assessment & Plan Note (Signed)
Hemoglobin drifting down, likely dilutional, no clinical bleeding. - Trend CBC

## 2021-09-08 NOTE — Assessment & Plan Note (Signed)
Continue atorvastatin

## 2021-09-08 NOTE — Hospital Course (Signed)
Mr. Stockhausen is a 45 y.o. M with DM on insulin pump, HTN, CKD 2 and BKA of the left leg who presented with painful draining ulcer at his left stump.  Evidently developed an ulcer because of an ill-fitting prosthesis.  Was getting a replacement upper part, but the ulcer got worse, he felt ill and came to the hospital.  In the ER, tachycardic, leukocytosis, glucose 470, Cr 1.9 from baseline 1.1-1.3, dry mucous membranes.    10/19: Admitted and started on antibiotics 10/21: Improving on antibiotics, redness better.  Vascular consulted, offered debridement, patient hesitant

## 2021-09-08 NOTE — TOC Progression Note (Signed)
Transition of Care [facility]) - Progression Note    Patient Details  Name: Richard Mendoza MRN: [id number] Date of Birth: May 23, 1976  Transition of Care [facility]) CM/SW Contact  Marlowe Sax, RN Phone Number: 09/08/2021, 1:24 PM  Clinical Narrative:      Called the [facility]ound Care clinic, they are closed for the day, I left a secure voice mail requesting an appointment for the patient, left the patient's phone number and his wife's phone number and requested they call him, I provided the patient with the wound care clinic's phone number and encouraged him to call the wound care clinic for an appointment if he does not hear back from them on Monday.      Expected Discharge Plan and Services                                                 Social Determinants of Health (SDOH) Interventions    Readmission Risk Interventions Readmission Risk Prevention Plan 03/10/2019  Medication Screening Complete  Transportation Screening Complete  Some recent data might be hidden

## 2021-09-08 NOTE — Plan of Care (Signed)
Patient sleeping between care. Aox4. Frequent request for dilaudid overnight. Blood sugar dropped below 70 twice. Hypoglycemic protocol initiated. Plan of care reviewed with patient. Call bell within reach.  PLAN OF CARE ONGOING Problem: Education: Goal: Knowledge of General Education information will improve Description: Including pain rating scale, medication(s)/side effects and non-pharmacologic comfort measures Outcome: Progressing   Problem: Health Behavior/Discharge Planning: Goal: Ability to manage health-related needs will improve Outcome: Progressing   Problem: Clinical Measurements: Goal: Ability to maintain clinical measurements within normal limits will improve Outcome: Progressing Goal: Will remain free from infection Outcome: Progressing Goal: Diagnostic test results will improve Outcome: Progressing Goal: Respiratory complications will improve Outcome: Progressing Goal: Cardiovascular complication will be avoided Outcome: Progressing   Problem: Activity: Goal: Risk for activity intolerance will decrease Outcome: Progressing   Problem: Nutrition: Goal: Adequate nutrition will be maintained Outcome: Progressing   Problem: Coping: Goal: Level of anxiety will decrease Outcome: Progressing   Problem: Elimination: Goal: Will not experience complications related to bowel motility Outcome: Progressing Goal: Will not experience complications related to urinary retention Outcome: Progressing   Problem: Pain Managment: Goal: General experience of comfort will improve Outcome: Progressing   Problem: Safety: Goal: Ability to remain free from injury will improve Outcome: Progressing   Problem: Skin Integrity: Goal: Risk for impaired skin integrity will decrease Outcome: Progressing

## 2021-09-08 NOTE — Assessment & Plan Note (Signed)
See above - Consult DM education

## 2021-09-08 NOTE — Assessment & Plan Note (Signed)
Blood pressure controlled off meds -Hold benazepril, furosemide

## 2021-09-08 NOTE — Assessment & Plan Note (Signed)
A1c 14%, very poorly controlled. Glucose 400 on admission, started on drip, resolved and sugars have been controlled in the last 24 hours. - Consult diabetes education - Continue home insulin pump

## 2021-09-08 NOTE — Progress Notes (Signed)
Progress Note    Richard Mendoza   XKG:[id number]  DOB: November 03, 1976  DOA: 09/06/2021     2 Date of Service: 09/08/2021     Brief summary: Richard Mendoza is a 45 y.o. M with DM on insulin pump, HTN, CKD 2 and BKA of the left leg who presented with painful draining ulcer at his left stump.  Evidently developed an ulcer because of an ill-fitting prosthesis.  Was getting a replacement upper part, but the ulcer got worse, he felt ill and came to the hospital.  In the ER, tachycardic, leukocytosis, glucose 470, Cr 1.9 from baseline 1.1-1.3, dry mucous membranes.    10/19: Admitted and started on antibiotics 10/21: Improving on antibiotics, redness better.  Vascular consulted, offered debridement, patient hesitant       Clinical Course The redness and pain are improving.  He has had no fever.  His mentation is good.  His sugars are getting better, actually somewhat low overnight.  Vascular surgery evaluated the wound.     Assessment and Plan * Sepsis (HCC) Presented with HR >90, RR 22, WBC 13K, infection in leg stump from ill-fitting prosthesis ulcer and poorly controlled sugars.  STarted on vancomycin and cefepime, redness and WBC improving.  Blood cultures NG and surface culture wtihout DROs yet.  - Consult vascular surgery  - Continue vancomycin and cefepime for now - If surface culture without drug resistant organisms, I would favor narrowing to Bactrim and Augmentin at discharge for 10 days or until Vascular surgery follow up  - Consult WOC - Consult to [name] for outpatient Wound Center referral - No prosthetic  Diabetic foot infection (HCC) See above - Consult DM education  Hypokalemia K still low - CHeck mag - SUpplement K again  Acute renal failure superimposed on stage 2 chronic kidney disease (HCC) Creatinine 1.9 on admission, improved to baseline 1.3 today.  DM type 2 with diabetic peripheral neuropathy (HCC) A1c 14%, very poorly controlled. Glucose 400 on  admission, started on drip, resolved and sugars have been controlled in the last 24 hours. - Consult diabetes education - Continue home insulin pump  Essential hypertension Blood pressure controlled off meds -Hold benazepril, furosemide  Hyponatremia Resolved  Hyperlipidemia associated with type 2 diabetes mellitus (HCC) - Continue atorvastatin  Gastroesophageal reflux disease - Continue pantoprazole  Anemia of chronic disease Hemoglobin drifting down, likely dilutional, no clinical bleeding. - Trend CBC  Chronic pain syndrome -Continue oral Morphine -Continue sertraline -Continue gabapentin  OBSTRUCTIVE SLEEP APNEA Not on cpap here.     Subjective:  No fever, confusion, Chills.  No vomiting.  Redness is improving.  The drainage has improved.  Objective Vitals:   09/07/21 2340 09/08/21 0420 09/08/21 0719 09/08/21 1057  BP: 125/73 126/63 140/82 112/77  Pulse: 79 83 82 73  Resp: 17 16 16 15   Temp: 98.3 F (36.8 C) 98 F (36.7 C) 98.1 F (36.7 C) 98.1 F (36.7 C)  TempSrc:      SpO2: 100% 99% 100% 99%  Weight:      Height:       90.7 kg  Vital signs were reviewed and unremarkable.   Exam General appearance: Adult male, sitting up in bed, interactive     HEENT: Anicteric, conjunctive a pink, lids and lashes normal.  Oropharynx moist, no oral lesions. Skin: There is moderate redness surrounding the left medial wound, there is some drainage from this wound, and fair amount of eschar.   Cardiac: RRR, no murmurs, no lower extremity edema  on the right Respiratory: Normal respiratory rate and rhythm, some crackles in the bases bilaterally, no wheezing. Abdomen:   MSK: Left BKA.  There are deformities or effusions. Neuro: Awake and alert, extraocular movements intact, moves all existing extremities with normal strength and coordination, speech fluent Psych: Attention normal, affect normal, judgment insight appear normal      Labs / Other Information My  review of labs, imaging, notes and other tests is significant for Hypokalemia, worsening anemia, resolved renal function, A1c 14.6.     Disposition Plan: Status is: Inpatient  Remains inpatient appropriate because: Vascular surgery need to evaluate the wound, and we need to come up with a plan for either debridement or not, and if not debridement then outpatient wound care.  Given the danger of limb loss, I will continue antibiotics 1 more day.  If no debridement planned, wound care follow-up can be arranged, and patient has 1 more day of antibiotics, I will likely discharge tomorrow with Bactrim and Augmentin.        Time spent: 35 minutes [facility] 09/08/2021, 11:09 AM

## 2021-09-08 NOTE — Assessment & Plan Note (Signed)
Presented with HR >90, RR 22, WBC 13K, infection in leg stump from ill-fitting prosthesis ulcer and poorly controlled sugars.  STarted on vancomycin and cefepime, redness and WBC improving.  Blood cultures NG and surface culture wtihout DROs yet.  - Consult vascular surgery  - Continue vancomycin and cefepime for now - If surface culture without drug resistant organisms, I would favor narrowing to Bactrim and Augmentin at discharge for 10 days or until Vascular surgery follow up  - Consult WOC - Consult to [facility] for outpatient Wound Center referral - No prosthetic

## 2021-09-08 NOTE — Progress Notes (Signed)
[facility] Daily Progress Note  Subjective: Presents to the [facility] emergency department with a wound to the medial aspect of his left stump.  Most likely due to a ill fitting prosthetic.  Objective: Vitals:   09/07/21 2340 09/08/21 0420 09/08/21 0719 09/08/21 1057  BP: 125/73 126/63 140/82 112/77  Pulse: 79 83 82 73  Resp: 17 16 16 15   Temp: 98.3 F (36.8 C) 98 F (36.7 C) 98.1 F (36.7 C) 98.1 F (36.7 C)  TempSrc:      SpO2: 100% 99% 100% 99%  Weight:      Height:        Intake/Output Summary (Last 24 hours) at 09/08/2021 1423 Last data filed at 09/08/2021 1419 Gross per 24 hour  Intake 1937.83 ml  Output 1400 ml  Net 537.83 ml   Physical Exam: A&Ox3, NAD CV: RRR Pulmonary: CTA Bilaterally Abdomen: Soft, Nontender, Nondistended Vascular:  Left lower extremity: Thigh soft.  Wound noted to the medial aspect of the stump.  Fibrinous exudate noted within the wound.  Possibly an area of necrotic tissue towards the distal aspect.   Laboratory: CBC    Component Value Date/Time   WBC 10.8 (H) 09/08/2021 0354   HGB 8.9 (L) 09/08/2021 0354   HCT 26.9 (L) 09/08/2021 0354   PLT 294 09/08/2021 0354   BMET    Component Value Date/Time   NA 135 09/08/2021 0354   K 2.9 (L) 09/08/2021 0354   CL 110 09/08/2021 0354   CO2 22 09/08/2021 0354   GLUCOSE 86 09/08/2021 0354   BUN 18 09/08/2021 0354   CREATININE 1.19 09/08/2021 0354   CALCIUM 8.5 (L) 09/08/2021 0354   GFRNONAA >60 09/08/2021 0354   GFRAA >60 05/05/2020 1930   Assessment/Planning: The patient is a 45 year old male who presents with wound to the medial aspect of his left below the knee amputation    1) wound formation most likely due to an ill fitting prosthetic.  Patient states that he is in the process of being fitted for a new upper part. 2) we discussed the possibility of going to the operating room for debridement.  At this time, the patient would like  to try local wound care first.  Hospitalist team will reach out to the wound center. 3) would like to see the patient in our office in approximately 2 weeks for a wound check.  Discharge follow-up has been entered.  Discussed in detail with Dr. 54 Lilybelle Mayeda PA-C 09/08/2021 2:23 PM

## 2021-09-08 NOTE — Assessment & Plan Note (Signed)
K still low - CHeck mag - SUpplement K again

## 2021-09-08 NOTE — Progress Notes (Signed)
Inpatient Diabetes Program Recommendations  AACE/ADA: New Consensus Statement on Inpatient Glycemic Control (2015)  Target Ranges:  Prepandial:   less than 140 mg/dL      Peak postprandial:   less than 180 mg/dL (1-2 hours)      Critically ill patients:  140 - 180 mg/dL   Lab Results  Component Value Date   GLUCAP 123 (H) 09/08/2021   HGBA1C 14.6 (H) 09/06/2021    Review of Glycemic Control  Diabetes history: DM type 2 Outpatient Diabetes medications: Medtronic 630 G insulin pump with Fiasp insulin and Dexcom CGM Current orders for Inpatient glycemic control:  Insulin pump order set  A1c 14.6% this admission  Spoke w/pt at bedside regarding A1c of 14.6% this admission and glucose control at home. Pt reports not replacing his insulin pump when it comes off in the shower. He reports when it comes off he does not leave enough time to place it back on in the morning before he is on the road at 5 am. Pt also noncompliant to a degree with diet as he is having mild hypoglycemia on current pump settings in the mid 60 range.  Spoke with pt about importance of glucose control and that he has to wear his insulin pump all the time. Told pt when his pump site comes out to immediately place another site in. Told pt he needs adjustments to his settings as he is going low inpatient (where we can control diet and activity). Encouraged follow up with Endocrinology.  Told pt about increased risk of cardiovascular, peripheral vascular and wound healing issues.  Pt reports he needs to try harder.  When I was in room pump was un the top drawer of bedside table disconnected as pt reports having repeated hypoglycemia.  Thanks,  Christena Deem RN, MSN, BC-ADM Inpatient Diabetes Coordinator Team Pager [phone] (8a-5p)

## 2021-09-08 NOTE — Assessment & Plan Note (Signed)
Creatinine 1.9 on admission, improved to baseline 1.3 today.

## 2021-09-08 NOTE — Progress Notes (Signed)
Pharmacy Antibiotic Note  Richard Mendoza is a 45 y.o. male with a h/o diabetes and BKA admitted on 09/06/2021 with recurrent cellulitis that failed outpatient doxycycline.  Pharmacy has been consulted for vancomycin and cefepime dosing. He received 1000 mg IV vancomycin and 2 grams ceftriaxone in the ED. His creatinine is elevated but it is difficult to determine his baseline. When he was discharged from [facility] in May his creatinine was approximately what it is today  Antibiotics Day 3  Plan:  1) Continue cefepime 2 grams IV every 8 hours  2) Continue Vancomycin 1250mg  Q12 hours as renal function has improved. Goal AUC 400-550 Expected AUC: 534.8 Expected Css: 15.7 SCr used: 1.19 mg/dL Daily SCr while on IV vancomycin  Height: 5\' 10"  (177.8 cm) Weight: 90.7 kg (200 lb) IBW/kg (Calculated) : 73  Temp (24hrs), Avg:98.2 F (36.8 C), Min:98 F (36.7 C), Max:98.3 F (36.8 C)  Recent Labs  Lab 09/06/21 1536 09/06/21 1708 09/06/21 1846 09/06/21 2133 09/07/21 0605 09/08/21 0354  WBC 13.6* 13.9*  --   --  11.8* 10.8*  CREATININE 1.92* 1.77*  --   --  1.05 1.19  LATICACIDVEN 2.4* 2.1* 1.8 1.6  --   --      Estimated Creatinine Clearance: 88.8 mL/min (by C-G formula based on SCr of 1.19 mg/dL).    No Known Allergies  Antimicrobials this admission: ceftriaxone 10/19 x 1 vancomycin 10/19 >>  cefepime 10/19 >>   Microbiology results: 10/19 BCx: NG<24 hours 10/19 UCx: mult species 10/19 WCx: moderate GPC 10/19 SARS CoV-2: negative 10/19 influenza A/B: negative   Thank you for allowing pharmacy to be a part of this patient's care.  Zarin Hagmann A Sanders Manninen 09/08/2021 2:22 PM

## 2021-09-08 NOTE — Assessment & Plan Note (Signed)
-  Continue oral Morphine -Continue sertraline -Continue gabapentin

## 2021-09-08 NOTE — Assessment & Plan Note (Signed)
Not on cpap here.

## 2021-09-09 DIAGNOSIS — G894 Chronic pain syndrome: Secondary | ICD-10-CM

## 2021-09-09 DIAGNOSIS — E1169 Type 2 diabetes mellitus with other specified complication: Secondary | ICD-10-CM

## 2021-09-09 DIAGNOSIS — A419 Sepsis, unspecified organism: Secondary | ICD-10-CM

## 2021-09-09 DIAGNOSIS — L03116 Cellulitis of left lower limb: Secondary | ICD-10-CM

## 2021-09-09 DIAGNOSIS — E785 Hyperlipidemia, unspecified: Secondary | ICD-10-CM

## 2021-09-09 DIAGNOSIS — E1142 Type 2 diabetes mellitus with diabetic polyneuropathy: Secondary | ICD-10-CM

## 2021-09-09 LAB — GLUCOSE, CAPILLARY
Glucose-Capillary: 110 mg/dL — ABNORMAL HIGH (ref 70–99)
Glucose-Capillary: 142 mg/dL — ABNORMAL HIGH (ref 70–99)
Glucose-Capillary: 192 mg/dL — ABNORMAL HIGH (ref 70–99)
Glucose-Capillary: 211 mg/dL — ABNORMAL HIGH (ref 70–99)
Glucose-Capillary: 211 mg/dL — ABNORMAL HIGH (ref 70–99)
Glucose-Capillary: 43 mg/dL — CL (ref 70–99)
Glucose-Capillary: 46 mg/dL — ABNORMAL LOW (ref 70–99)

## 2021-09-09 LAB — AEROBIC CULTURE W GRAM STAIN (SUPERFICIAL SPECIMEN)

## 2021-09-09 MED ORDER — AMOXICILLIN-POT CLAVULANATE 875-125 MG PO TABS: 1.0000 | ORAL_TABLET | Freq: Two times a day (BID) | ORAL | 0 refills | Status: AC

## 2021-09-09 MED ORDER — DEXTROSE 50 % IV SOLN
1.0000 | Freq: Once | INTRAVENOUS | Status: AC
  Administered 2021-09-09: 50 mL via INTRAVENOUS
  Filled 2021-09-09: qty 50

## 2021-09-09 MED ORDER — SULFAMETHOXAZOLE-TRIMETHOPRIM 800-160 MG PO TABS: 1.0000 | ORAL_TABLET | Freq: Two times a day (BID) | ORAL | 0 refills | Status: AC

## 2021-09-09 MED ORDER — COLLAGENASE 250 UNIT/GM EX OINT: TOPICAL_OINTMENT | Freq: Every day | CUTANEOUS | 0 refills | Status: DC

## 2021-09-09 NOTE — TOC Transition Note (Signed)
Transition of Care [facility]) - CM/SW Discharge Note   Patient Details  Name: Richard Mendoza MRN: [id number] Date of Birth: [birth year]  Transition of Care [facility]) CM/SW Contact:  Bing Quarry, RN Phone Number: 09/09/2021, 1:01 PM   Clinical Narrative:   10/22: Patient being discharge to home/self care today. [facility] to follow up with patient, but patient also has contact information and will reach out to them if does not hear from them Monday. Patient has a Wheelchair at home and has transportation per CM initial assessment note. Was seen by Diabetes Coordinator on 10/21 for insulin pump compliance issues. DC encouraged patient to follow up with endocrinology.  In addition to follow up with [facility]:  Per provider DC Summary: Recommendations for Outpatient Follow-up:  Follow up with PCP in 1-2 weeks Follow-up with vascular surgery Follow-up at wound care center Please obtain BMP/CBC in one week Please follow up on the following pending results: Final wound culture results Gabriel Cirri RN CM   Final next level of care: Home/Self Care (Follow up with [facility].) Barriers to Discharge: Barriers Resolved   Patient Goals and CMS Choice        Discharge Placement                       Discharge Plan and Services                DME Arranged: N/A (Has DME at home.) DME Agency: NA       HH Arranged: NA HH Agency: NA        Social Determinants of Health (SDOH) Interventions     Readmission Risk Interventions Readmission Risk Prevention Plan 03/10/2019  Medication Screening Complete  Transportation Screening Complete  Some recent data might be hidden

## 2021-09-09 NOTE — Progress Notes (Signed)
Blood pressure 133/77, pulse 75, temperature 98.1 F (36.7 C), resp. rate 18, height 5\' 10"  (1.778 m), weight 90.7 kg, SpO2 100 %. Iv cath removed sites c/d/I. Pt d/c with wound care supplies changed pt dressing before leaving. Pt to f/u with [facility] wound care.Discussed d/c packet with pt at bedside where he verbalized his understanding d/c pt in w/c down to private care with all supplies.

## 2021-09-09 NOTE — Discharge Summary (Signed)
Physician Discharge Summary  Trimaine Maser ASN:[id number] DOB: Oct 13, 1976 DOA: 09/06/2021  PCP: Tracie Harrier, MD  Admit date: 09/06/2021 Discharge date: 09/09/2021  Admitted From: Home Disposition: Home  Recommendations for Outpatient Follow-up:  Follow up with PCP in 1-2 weeks Follow-up with vascular surgery Follow-up at wound care center Please obtain BMP/CBC in one week Please follow up on the following pending results: Final wound culture results  Home Health: No Equipment/Devices: Wheelchair Discharge Condition: Stable CODE STATUS: Full Diet recommendation: Heart Healthy / Carb Modified   Brief/Interim Summary: Mr. Amescua is a 45 y.o. M with DM on insulin pump, HTN, CKD 2 and BKA of the left leg who presented with painful draining ulcer at at medial side of left knee, secondary to ill fitted prosthesis.  Initially met sepsis criteria with leukocytosis, tachycardia and AKI.  He was started on vancomycin and cefepime.  Vascular surgery was consulted and they recommend debridement of wound due to the extensive nature.  Patient refused debridement and would like to follow-up with them as an outpatient.  Patient had similar type of wound on the lateral side of the same knee and that wound culture with group B strep.  Preliminary creatinine wound cultures with group B strep/strep agalactia which are normally sensitive to penicillin and vancomycin. Switched his antibiotics to Augmentin and he will follow-up with vascular surgery as an outpatient for further recommendations. He was also given a 10-day course of Bactrim but apparently his insurance might not pay. He was also advised to follow-up at wound care center to help with the healing of his wound.  Patient will continue the rest of his home medications and follow-up with his providers.   Discharge Diagnoses:  Principal Problem:   Sepsis ([place]) Active Problems:   OBSTRUCTIVE SLEEP APNEA   Essential hypertension    Diabetic foot infection (HCC)   Chronic pain syndrome   Anemia of chronic disease   DM type 2 with diabetic peripheral neuropathy (HCC)   Gastroesophageal reflux disease   Hyperlipidemia associated with type 2 diabetes mellitus (HCC)   Tobacco abuse disorder   Acute renal failure superimposed on stage 2 chronic kidney disease (HCC)   Hypokalemia   Hyponatremia   Cellulitis of left lower extremity without foot    Discharge Instructions  Discharge Instructions     Diet - low sodium heart healthy   Complete by: As directed    Discharge instructions   Complete by: As directed    It was pleasure taking care of you. You are being given 2 different types of antibiotics, Augmentin for 2 weeks and Bactrim for 10 days.  Please take it as directed and follow-up closely with your vascular surgeon for further recommendations. Please call the wound care center on Monday morning to get established with them for wound care. Keep your wound clean and dry and change dressing daily as directed.   Discharge wound care:   Complete by: As directed    Apply Santyl to left leg wounds Q day, then cover with moist gauze and then ABD pads and kerlex   Increase activity slowly   Complete by: As directed       Allergies as of 09/09/2021   No Known Allergies      Medication List     STOP taking these medications    amLODipine 5 MG tablet Commonly known as: NORVASC   fenofibrate 48 MG tablet Commonly known as: TRICOR   furosemide 40 MG tablet Commonly known as: LASIX  POTASSIUM PO       TAKE these medications    amoxicillin-clavulanate 875-125 MG tablet Commonly known as: AUGMENTIN Take 1 tablet by mouth 2 (two) times daily for 14 days.   atorvastatin 40 MG tablet Commonly known as: LIPITOR Take 40 mg by mouth daily.   benazepril 20 MG tablet Commonly known as: LOTENSIN Take 20 mg by mouth daily. What changed: Another medication with the same name was removed. Continue taking  this medication, and follow the directions you see here.   busPIRone 15 MG tablet Commonly known as: BUSPAR Take 15 mg by mouth 2 (two) times daily.   collagenase ointment Commonly known as: SANTYL Apply topically daily. Start taking on: September 10, 2021   [facility] Senna 8.6 MG tablet Generic drug: senna Take 1 tablet by mouth 2 (two) times daily as needed for constipation.   dicyclomine 10 MG capsule Commonly known as: BENTYL Take 10 mg by mouth in the morning and at bedtime.   famotidine 20 MG tablet Commonly known as: PEPCID Take 20 mg by mouth daily.   Fiasp 100 UNIT/ML Soln Generic drug: Insulin Aspart (w/Niacinamide) Inject into the skin continuous. Via pump   gabapentin 300 MG capsule Commonly known as: NEURONTIN Take 1 capsule (300 mg total) by mouth 2 (two) times daily with breakfast and lunch AND 2 capsules (600 mg total) at bedtime. What changed:  See the new instructions. Another medication with the same name was removed. Continue taking this medication, and follow the directions you see here.   linaclotide 72 MCG capsule Commonly known as: LINZESS Take 72 mcg by mouth daily as needed (constipation).   methocarbamol 500 MG tablet Commonly known as: ROBAXIN Take 2 tablets by mouth 2 (two) times daily as needed for muscle spasms.   metoCLOPramide 10 MG tablet Commonly known as: REGLAN Take 10 mg by mouth daily.   ondansetron 8 MG tablet Commonly known as: ZOFRAN Take 8 mg by mouth every 6 (six) hours as needed for nausea/vomiting.   oxyCODONE 5 MG immediate release tablet Commonly known as: Oxy IR/ROXICODONE Take 1 tablet (5 mg total) by mouth every 4 (four) hours as needed for moderate pain or severe pain.   pantoprazole 40 MG tablet Commonly known as: PROTONIX Take 40 mg by mouth daily.   sertraline 100 MG tablet Commonly known as: ZOLOFT Take 100 mg by mouth daily.   sildenafil 20 MG tablet Commonly known as: REVATIO Take 100 mg by mouth daily  as needed (erectile dysfunction).   sulfamethoxazole-trimethoprim 800-160 MG tablet Commonly known as: BACTRIM DS Take 1 tablet by mouth 2 (two) times daily for 10 days.   traZODone 100 MG tablet Commonly known as: DESYREL Take 100 mg by mouth at bedtime.   vitamin B-12 1000 MCG tablet Commonly known as: CYANOCOBALAMIN Take 1,000 mcg by mouth in the morning and at bedtime.               Discharge Care Instructions  (From admission, onward)           Start     Ordered   09/09/21 0000  Discharge wound care:       Comments: Apply Santyl to left leg wounds Q day, then cover with moist gauze and then ABD pads and kerlex   09/09/21 1229            Follow-up Information     Kris Hartmann, NP Follow up in 2 week(s).   Specialty: Vascular Surgery Why: Wound  check.  No studies needed. Contact information: [place] [address] [phone]         Tracie Harrier, MD. Schedule an appointment as soon as possible for a visit in 1 week(s).   Specialty: Internal Medicine Contact information: [address] [place] [address] [phone]                No Known Allergies  Consultations: Vascular surgery  Procedures/Studies: DG Knee 2 Views Left  Result Date: 09/06/2021 CLINICAL DATA:  Cellulitis, knee infection, ulcer EXAM: LEFT KNEE - 1-2 VIEW COMPARISON:  None FINDINGS: Prior below the knee amputation. No bone destruction to suggest osteomyelitis. Enthesopathic changes noted along the anterior tibia and patella. No acute bony abnormality. Specifically, no fracture, subluxation, or dislocation. No joint effusion. IMPRESSION: Prior BKA.  No acute bony abnormality. Electronically Signed   By: Rolm Baptise M.D.   On: 09/06/2021 17:22   DG Chest Port 1 View  Result Date: 09/06/2021 CLINICAL DATA:  Questionable sepsis EXAM: PORTABLE CHEST 1 VIEW COMPARISON:  06/04/2020 FINDINGS: Heart is normal size.  Right lung clear. Questionable patchy opacity peripherally at the left lung base. No effusions. No acute bony abnormality. IMPRESSION: Questionable patchy density peripherally at the left lung base. Electronically Signed   By: Rolm Baptise M.D.   On: 09/06/2021 17:23    Subjective: Patient was seen and examined today.  No new complaints.  He does not want to do debridement and would like to go home and follow-up with vascular surgery as an outpatient.  He understands that he has to call the wound care center on Monday to get established.  Per patient he had similar wound on the lateral side of that knee which improved with 4 weeks of Augmentin.  Per patient whenever he gets debridement his wound gets worse.  Tried educating him but he would like to follow-up with vascular surgery as an outpatient and no debridement at this time.  Discharge Exam: Vitals:   09/09/21 0517 09/09/21 0735  BP: 138/88 133/77  Pulse: 86 75  Resp: 16 18  Temp: 97.6 F (36.4 C) 98.1 F (36.7 C)  SpO2: 100% 100%   Vitals:   09/08/21 2037 09/09/21 0007 09/09/21 0517 09/09/21 0735  BP: 103/80 122/68 138/88 133/77  Pulse: 69 70 86 75  Resp: _0 Temp: 98 F (36.7 C) 97.9 F (36.6 C) 97.6 F (36.4 C) 98.1 F (36.7 C)  TempSrc: Oral     SpO2: 99% 97% 100% 100%  Weight:      Height:        General: Pt is alert, awake, not in acute distress Cardiovascular: RRR, S1/S2 +, no rubs, no gallops Respiratory: CTA bilaterally, no wheezing, no rhonchi Abdominal: Soft, NT, ND, bowel sounds + Extremities: no edema, no cyanosis, left BKA with a purulent wound on medial side of left knee.   The results of significant diagnostics from this hospitalization (including imaging, microbiology, ancillary and laboratory) are listed below for reference.    Microbiology: Recent Results (from the past 240 hour(s))  Blood Culture (routine x 2)     Status: None (Preliminary result)   Collection Time: 09/06/21  5:08 PM    Specimen: BLOOD  Result Value Ref Range Status   Specimen Description BLOOD BLOOD LEFT FOREARM  Final   Special Requests   Final    BOTTLES DRAWN AEROBIC AND ANAEROBIC Blood Culture adequate volume   Culture   Final  NO GROWTH 2 DAYS Performed at [facility], [address] [place], [place] [address]    Report Status PENDING  Incomplete  Blood Culture (routine x 2)     Status: None (Preliminary result)   Collection Time: 09/06/21  5:08 PM   Specimen: BLOOD  Result Value Ref Range Status   Specimen Description BLOOD RIGHT ANTECUBITAL  Final   Special Requests   Final    BOTTLES DRAWN AEROBIC AND ANAEROBIC Blood Culture results may not be optimal due to an excessive volume of blood received in culture bottles   Culture   Final    NO GROWTH 2 DAYS Performed at [facility], [address] [place], [place] [address]    Report Status PENDING  Incomplete  Resp Panel by RT-PCR (Flu A&B, Covid) Nasopharyngeal Swab     Status: None   Collection Time: 09/06/21  5:35 PM   Specimen: Nasopharyngeal Swab; Nasopharyngeal(NP) swabs in vial transport medium  Result Value Ref Range Status   SARS Coronavirus 2 by RT PCR NEGATIVE NEGATIVE Final    Comment: (NOTE) SARS-CoV-2 target nucleic acids are NOT DETECTED.  The SARS-CoV-2 RNA is generally detectable in upper respiratory specimens during the acute phase of infection. The lowest concentration of SARS-CoV-2 viral copies this assay can detect is 138 copies/mL. A negative result does not preclude SARS-Cov-2 infection and should not be used as the sole basis for treatment or other patient management decisions. A negative result may occur with  improper specimen collection/handling, submission of specimen other than nasopharyngeal swab, presence of viral mutation(s) within the areas targeted by this assay, and inadequate number of viral copies(<138 copies/mL). A negative result must be combined with clinical observations,  patient history, and epidemiological information. The expected result is Negative.  Fact Sheet for Patients:  EntrepreneurPulse.com.au  Fact Sheet for Healthcare Providers:  IncredibleEmployment.be  This test is no t yet approved or cleared by the [place] FDA and  has been authorized for detection and/or diagnosis of SARS-CoV-2 by FDA under an Emergency Use Authorization (EUA). This EUA will remain  in effect (meaning this test can be used) for the duration of the COVID-19 declaration under Section 564(b)(1) of the Act, 21 U.S.C.section 360bbb-3(b)(1), unless the authorization is terminated  or revoked sooner.       Influenza A by PCR NEGATIVE NEGATIVE Final   Influenza B by PCR NEGATIVE NEGATIVE Final    Comment: (NOTE) The Xpert Xpress SARS-CoV-2/FLU/RSV plus assay is intended as an aid in the diagnosis of influenza from Nasopharyngeal swab specimens and should not be used as a sole basis for treatment. Nasal washings and aspirates are unacceptable for Xpert Xpress SARS-CoV-2/FLU/RSV testing.  Fact Sheet for Patients: EntrepreneurPulse.com.au  Fact Sheet for Healthcare Providers: IncredibleEmployment.be  This test is not yet approved or cleared by the [place] FDA and has been authorized for detection and/or diagnosis of SARS-CoV-2 by FDA under an Emergency Use Authorization (EUA). This EUA will remain in effect (meaning this test can be used) for the duration of the COVID-19 declaration under Section 564(b)(1) of the Act, 21 U.S.C. section 360bbb-3(b)(1), unless the authorization is terminated or revoked.  Performed at [facility], [address] [place], [place] [address]   Urine Culture     Status: Abnormal   Collection Time: 09/06/21  5:35 PM   Specimen: Urine, Random  Result Value Ref Range Status   Specimen Description   Final    URINE, RANDOM Performed at [facility]  [facility], [address] [place], [place] [address]    Special Requests   Final    NONE Performed at [facility], [address] [place], [place] [address]    Culture MULTIPLE SPECIES PRESENT, SUGGEST RECOLLECTION (A)  Final   Report Status 09/08/2021 FINAL  Final  Aerobic Culture w Gram Stain (superficial specimen)     Status: None (Preliminary result)   Collection Time: 09/06/21  7:57 PM   Specimen: Wound  Result Value Ref Range Status   Specimen Description   Final    WOUND Performed at [facility], [address], [place], [place] [address]    Special Requests   Final    NONE Performed at [facility], [address] [place], [place] [address]    Gram Stain   Final    FEW SQUAMOUS EPITHELIAL CELLS PRESENT MODERATE WBC SEEN MODERATE GRAM POSITIVE COCCI    Culture   Final    FEW GROUP B STREP(S.AGALACTIAE)ISOLATED TESTING AGAINST S. AGALACTIAE NOT ROUTINELY PERFORMED DUE TO PREDICTABILITY OF AMP/PEN/VAN SUSCEPTIBILITY. CULTURE REINCUBATED FOR BETTER GROWTH Performed at [facility], [place] [address], [place], [place] [address]    Report Status PENDING  Incomplete     Labs: BNP (last 3 results) No results for input(s): BNP in the last 8760 hours. Basic Metabolic Panel: Recent Labs  Lab 09/06/21 1536 09/06/21 1708 09/07/21 0605 09/08/21 0354  NA 132* 129* 135 135  K 3.0* 2.7* 3.3* 2.9*  CL 102 99 108 110  CO2 21* 21* 21* 22  GLUCOSE 469* 454* 100* 86  BUN 30* 32* 24* 18  CREATININE 1.92* 1.77* 1.05 1.19  CALCIUM 9.0 8.8* 8.6* 8.5*  MG  --   --   --  1.7   Liver Function Tests: Recent Labs  Lab 09/06/21 1536 09/06/21 1708 09/07/21 0605  AST 19 19 13*  ALT _0 ALKPHOS 99 100 75  BILITOT 0.5 0.6 0.3  PROT 7.1 7.3 6.2*  ALBUMIN 2.8* 2.9* 2.3*   No results for input(s): LIPASE, AMYLASE in the last 168 hours. No results for input(s): AMMONIA in the last 168 hours. CBC: Recent Labs  Lab 09/06/21 1536  09/06/21 1708 09/07/21 0605 09/08/21 0354  WBC 13.6* 13.9* 11.8* 10.8*  NEUTROABS  --  11.6*  --   --   HGB 10.6* 10.7* 9.6* 8.9*  HCT 30.6* 30.9* 28.4* 26.9*  MCV 82.5 83.1 83.3 84.6  PLT 309 322 300 294   Cardiac Enzymes: Recent Labs  Lab 09/06/21 1708  CKTOTAL 141   BNP: Invalid input(s): POCBNP CBG: Recent Labs  Lab 09/09/21 0108 09/09/21 0208 09/09/21 0609 09/09/21 0841 09/09/21 1213  GLUCAP 211* 211* 192* 142* 110*   D-Dimer No results for input(s): DDIMER in the last 72 hours. Hgb A1c Recent Labs    09/06/21 1536  HGBA1C 14.6*   Lipid Profile No results for input(s): CHOL, HDL, LDLCALC, TRIG, CHOLHDL, LDLDIRECT in the last 72 hours. Thyroid function studies No results for input(s): TSH, T4TOTAL, T3FREE, THYROIDAB in the last 72 hours.  Invalid input(s): FREET3 Anemia work up No results for input(s): VITAMINB12, FOLATE, FERRITIN, TIBC, IRON, RETICCTPCT in the last 72 hours. Urinalysis    Component Value Date/Time   COLORURINE YELLOW (A) 09/06/2021 1735   APPEARANCEUR CLEAR (A) 09/06/2021 1735   LABSPEC 1.024 09/06/2021 1735   PHURINE 5.0 09/06/2021 1735   GLUCOSEU >=500 (A) 09/06/2021 1735   HGBUR SMALL (A) 09/06/2021 1735  BILIRUBINUR NEGATIVE 09/06/2021 1735   KETONESUR NEGATIVE 09/06/2021 1735   PROTEINUR 30 (A) 09/06/2021 1735   UROBILINOGEN 0.2 07/31/2011 2217   NITRITE NEGATIVE 09/06/2021 1735   LEUKOCYTESUR NEGATIVE 09/06/2021 1735   Sepsis Labs Invalid input(s): PROCALCITONIN,  WBC,  LACTICIDVEN Microbiology Recent Results (from the past 240 hour(s))  Blood Culture (routine x 2)     Status: None (Preliminary result)   Collection Time: 09/06/21  5:08 PM   Specimen: BLOOD  Result Value Ref Range Status   Specimen Description BLOOD BLOOD LEFT FOREARM  Final   Special Requests   Final    BOTTLES DRAWN AEROBIC AND ANAEROBIC Blood Culture adequate volume   Culture   Final    NO GROWTH 2 DAYS Performed at [facility], [address] [place], [place] [address]    Report Status PENDING  Incomplete  Blood Culture (routine x 2)     Status: None (Preliminary result)   Collection Time: 09/06/21  5:08 PM   Specimen: BLOOD  Result Value Ref Range Status   Specimen Description BLOOD RIGHT ANTECUBITAL  Final   Special Requests   Final    BOTTLES DRAWN AEROBIC AND ANAEROBIC Blood Culture results may not be optimal due to an excessive volume of blood received in culture bottles   Culture   Final    NO GROWTH 2 DAYS Performed at [facility], [address] [place], [place] [address]    Report Status PENDING  Incomplete  Resp Panel by RT-PCR (Flu A&B, Covid) Nasopharyngeal Swab     Status: None   Collection Time: 09/06/21  5:35 PM   Specimen: Nasopharyngeal Swab; Nasopharyngeal(NP) swabs in vial transport medium  Result Value Ref Range Status   SARS Coronavirus 2 by RT PCR NEGATIVE NEGATIVE Final    Comment: (NOTE) SARS-CoV-2 target nucleic acids are NOT DETECTED.  The SARS-CoV-2 RNA is generally detectable in upper respiratory specimens during the acute phase of infection. The lowest concentration of SARS-CoV-2 viral copies this assay can detect is 138 copies/mL. A negative result does not preclude SARS-Cov-2 infection and should not be used as the sole basis for treatment or other patient management decisions. A negative result may occur with  improper specimen collection/handling, submission of specimen other than nasopharyngeal swab, presence of viral mutation(s) within the areas targeted by this assay, and inadequate number of viral copies(<138 copies/mL). A negative result must be combined with clinical observations, patient history, and epidemiological information. The expected result is Negative.  Fact Sheet for Patients:  BloggerCourse.com  Fact Sheet for Healthcare Providers:  SeriousBroker.it  This test is no t yet approved or cleared  by the [place] FDA and  has been authorized for detection and/or diagnosis of SARS-CoV-2 by FDA under an Emergency Use Authorization (EUA). This EUA will remain  in effect (meaning this test can be used) for the duration of the COVID-19 declaration under Section 564(b)(1) of the Act, 21 U.S.C.section 360bbb-3(b)(1), unless the authorization is terminated  or revoked sooner.       Influenza A by PCR NEGATIVE NEGATIVE Final   Influenza B by PCR NEGATIVE NEGATIVE Final    Comment: (NOTE) The Xpert Xpress SARS-CoV-2/FLU/RSV plus assay is intended as an aid in the diagnosis of influenza from Nasopharyngeal swab specimens and should not be used as a sole basis for treatment. Nasal washings and aspirates are unacceptable for Xpert Xpress SARS-CoV-2/FLU/RSV testing.  Fact Sheet for Patients: BloggerCourse.com  Fact Sheet for Healthcare Providers: SeriousBroker.it  This test is not yet approved or cleared by the [place] and has been authorized for detection and/or diagnosis of SARS-CoV-2 by FDA under an Emergency Use Authorization (EUA). This EUA will remain in effect (meaning this test can be used) for the duration of the COVID-19 declaration under Section 564(b)(1) of the Act, 21 U.S.C. section 360bbb-3(b)(1), unless the authorization is terminated or revoked.  Performed at [facility], [address] [place], [place] [address]   Urine Culture     Status: Abnormal   Collection Time: 09/06/21  5:35 PM   Specimen: Urine, Random  Result Value Ref Range Status   Specimen Description   Final    URINE, RANDOM Performed at [facility], [address] [place], [place] [address]    Special Requests   Final    NONE Performed at [facility], [address] [place], [place] [address]    Culture MULTIPLE SPECIES PRESENT, SUGGEST RECOLLECTION (A)  Final   Report Status 09/08/2021 FINAL   Final  Aerobic Culture w Gram Stain (superficial specimen)     Status: None (Preliminary result)   Collection Time: 09/06/21  7:57 PM   Specimen: Wound  Result Value Ref Range Status   Specimen Description   Final    WOUND Performed at [facility], [address], [place], [place] [address]    Special Requests   Final    NONE Performed at [facility], [address] [place], [place] [address]    Gram Stain   Final    FEW SQUAMOUS EPITHELIAL CELLS PRESENT MODERATE WBC SEEN MODERATE GRAM POSITIVE COCCI    Culture   Final    FEW GROUP B STREP(S.AGALACTIAE)ISOLATED TESTING AGAINST S. AGALACTIAE NOT ROUTINELY PERFORMED DUE TO PREDICTABILITY OF AMP/PEN/VAN SUSCEPTIBILITY. CULTURE REINCUBATED FOR BETTER GROWTH Performed at [facility], [place] [address] [place], [place] [address]    Report Status PENDING  Incomplete    Time coordinating discharge: Over 30 minutes  SIGNED:  Lorella Nimrod, MD  [facility] 09/09/2021, 12:37 PM  If 7PM-7AM, please contact night-coverage [email or web address]  This record has been created using Systems analyst. Errors have been sought and corrected,but may not always be located. Such creation errors do not reflect on the standard of care.

## 2021-09-09 NOTE — Progress Notes (Signed)
Hypoglycemic Event  CBG: 46  Treatment: 8 oz juice/soda  Symptoms: Sweaty  Follow-up CBG: Time: 0034 CBG Result:43  Possible Reasons for Event: Other: Patient's own insulin pump  Comments/MD notified: Patient removed insulin pump. B.Morrison, NP made aware of blood sugar results. D50 ordered then blood sugar rechecked, 211. RN instructed by NP to place insulin pump back on patient. Pt agreeable.

## 2021-09-10 LAB — GLUCOSE, CAPILLARY: Glucose-Capillary: 51 mg/dL — ABNORMAL LOW (ref 70–99)

## 2021-09-11 LAB — CULTURE, BLOOD (ROUTINE X 2)
Culture: NO GROWTH
Culture: NO GROWTH
Special Requests: ADEQUATE

## 2021-09-19 ENCOUNTER — Encounter: Payer: Medicaid Other | Attending: Physician Assistant | Admitting: Physician Assistant

## 2021-09-19 ENCOUNTER — Other Ambulatory Visit: Payer: Self-pay

## 2021-09-19 DIAGNOSIS — L98492 Non-pressure chronic ulcer of skin of other sites with fat layer exposed: Secondary | ICD-10-CM | POA: Diagnosis not present

## 2021-09-19 DIAGNOSIS — E11622 Type 2 diabetes mellitus with other skin ulcer: Secondary | ICD-10-CM | POA: Insufficient documentation

## 2021-09-19 DIAGNOSIS — E11621 Type 2 diabetes mellitus with foot ulcer: Secondary | ICD-10-CM | POA: Diagnosis not present

## 2021-09-19 DIAGNOSIS — E1140 Type 2 diabetes mellitus with diabetic neuropathy, unspecified: Secondary | ICD-10-CM | POA: Insufficient documentation

## 2021-09-19 DIAGNOSIS — I10 Essential (primary) hypertension: Secondary | ICD-10-CM | POA: Insufficient documentation

## 2021-09-19 NOTE — Progress Notes (Signed)
Richard Mendoza, Richard Mendoza ([id number]) Visit Report for 09/19/2021 Allergy List Details Patient Name: Richard Mendoza, Richard Mendoza Date of Service: 09/19/2021 10:15 AM Medical Record Number: [id number] Patient Account Number: [id number] Date of Birth/Sex: 08-10-1976 (45 y.o. M) Treating RN: Donnamarie Poag Primary Care Faizan Geraci: Tracie Harrier Other Clinician: Referring Joia Doyle: Leotis Pain Treating Bryce Cheever/Extender: Jeri Cos Weeks in Treatment: 0 Allergies Active Allergies No Known Allergies Allergy Notes Electronic Signature(s) Signed: 09/19/2021 2:28:05 PM By: Donnamarie Poag Entered By: Donnamarie Poag on 09/19/2021 10:43:48 Richard Mendoza ([id number]) -------------------------------------------------------------------------------- Arrival Information Details Patient Name: Richard Mendoza Date of Service: 09/19/2021 10:15 AM Medical Record Number: [id number] Patient Account Number: [id number] Date of Birth/Sex: June 25, 1976 (45 y.o. M) Treating RN: Donnamarie Poag Primary Care Alle Difabio: Tracie Harrier Other Clinician: Referring Halden Phegley: Leotis Pain Treating Najir Roop/Extender: Skipper Cliche in Treatment: 0 Visit Information Patient Arrived: Wheel Chair Arrival Time: 10:32 Accompanied By: self Transfer Assistance: EasyPivot Patient Lift Patient Identification Verified: Yes Secondary Verification Process Completed: Yes Patient Has Alerts: Yes Patient Alerts: DIABETIC/insulin pump Left BKA AVVS ABI R 1.31 -7/22 Electronic Signature(s) Signed: 09/19/2021 2:28:05 PM By: Donnamarie Poag Entered By: Donnamarie Poag on 09/19/2021 11:04:50 Richard Mendoza ([id number]) -------------------------------------------------------------------------------- Clinic Level of Care Assessment Details Patient Name: Richard Mendoza Date of Service: 09/19/2021 10:15 AM Medical Record Number: [id number] Patient Account Number: [id number] Date of Birth/Sex: 1976-04-03 (45 y.o. M) Treating RN: Donnamarie Poag Primary Care Liberti Appleton: Tracie Harrier Other Clinician: Referring Lakethia Coppess: Leotis Pain Treating Lariza Cothron/Extender: Skipper Cliche in Treatment: 0 Clinic Level of Care Assessment Items TOOL 1 Quantity Score []  - Use when EandM and Procedure is performed on INITIAL visit 0 ASSESSMENTS - Nursing Assessment / Reassessment X - General Physical Exam (combine w/ comprehensive assessment (listed just below) when performed on new 1 20 pt. evals) X- 1 25 Comprehensive Assessment (HX, ROS, Risk Assessments, Wounds Hx, etc.) ASSESSMENTS - Wound and Skin Assessment / Reassessment []  - Dermatologic / Skin Assessment (not related to wound area) 0 ASSESSMENTS - Ostomy and/or Continence Assessment and Care []  - Incontinence Assessment and Management 0 []  - 0 Ostomy Care Assessment and Management (repouching, etc.) PROCESS - Coordination of Care X - Simple Patient / Family Education for ongoing care 1 15 []  - 0 Complex (extensive) Patient / Family Education for ongoing care X- 1 10 Staff obtains Programmer, systems, Records, Test Results / Process Orders []  - 0 Staff telephones HHA, Nursing Homes / Clarify orders / etc []  - 0 Routine Transfer to another Facility (non-emergent condition) X- 1 10 Routine Hospital Admission (non-emergent condition) []  - 0 New Admissions / Biomedical engineer / Ordering NPWT, Apligraf, etc. []  - 0 Emergency Hospital Admission (emergent condition) PROCESS - Special Needs []  - Pediatric / Minor Patient Management 0 []  - 0 Isolation Patient Management []  - 0 Hearing / Language / Visual special needs []  - 0 Assessment of Community assistance (transportation, D/C planning, etc.) []  - 0 Additional assistance / Altered mentation []  - 0 Support Surface(s) Assessment (bed, cushion, seat, etc.) INTERVENTIONS - Miscellaneous []  - External ear exam 0 []  - 0 Patient Transfer (multiple staff / Civil Service fast streamer / Similar devices) []  - 0 Simple Staple / Suture removal (25 or less) []  - 0 Complex Staple  / Suture removal (26 or more) []  - 0 Hypo/Hyperglycemic Management (do not check if billed separately) []  - 0 Ankle / Brachial Index (ABI) - do not check if billed separately Has the patient been seen at the hospital within the last three years: Yes Total Score: 80 Level Of Care:  New/Established - Level 3 Richard Mendoza, Richard Mendoza ([id number]) Electronic Signature(s) Signed: 09/19/2021 2:28:05 PM By: Hansel Feinstein Entered By: Hansel Feinstein on 09/19/2021 11:49:29 Richard Mendoza ([id number]) -------------------------------------------------------------------------------- Encounter Discharge Information Details Patient Name: Richard Mendoza Date of Service: 09/19/2021 10:15 AM Medical Record Number: [id number] Patient Account Number: [id number] Date of Birth/Sex: 12-21-1975 (45 y.o. M) Treating RN: Hansel Feinstein Primary Care Ules Marsala: Barbette Reichmann Other Clinician: Referring Serjio Deupree: Festus Barren Treating Tkai Serfass/Extender: Rowan Blase in Treatment: 0 Encounter Discharge Information Items Post Procedure Vitals Discharge Condition: Stable Temperature (F): 97.9 Ambulatory Status: Wheelchair Pulse (bpm): 82 Discharge Destination: Home Respiratory Rate (breaths/min): 16 Transportation: Private Auto Blood Pressure (mmHg): 170/95 Accompanied By: self Schedule Follow-up Appointment: Yes Clinical Summary of Care: Electronic Signature(s) Signed: 09/19/2021 2:28:05 PM By: Hansel Feinstein Entered By: Hansel Feinstein on 09/19/2021 11:53:21 Richard Mendoza ([id number]) -------------------------------------------------------------------------------- Lower Extremity Assessment Details Patient Name: Richard Mendoza Date of Service: 09/19/2021 10:15 AM Medical Record Number: [id number] Patient Account Number: [id number] Date of Birth/Sex: 06-12-1976 (45 y.o. M) Treating RN: Hansel Feinstein Primary Care Tene Gato: Barbette Reichmann Other Clinician: Referring Aaro Meyers: Festus Barren Treating Atlas Crossland/Extender: Allen Derry Weeks in Treatment: 0 Edema Assessment Assessed: [Left: Yes] [Right: No] Edema: [Left: N] [Right: o] Electronic Signature(s) Signed: 09/19/2021 2:28:05 PM By: Hansel Feinstein Entered By: Hansel Feinstein on 09/19/2021 10:50:59 Richard Mendoza ([id number]) -------------------------------------------------------------------------------- Multi Wound Chart Details Patient Name: Richard Mendoza Date of Service: 09/19/2021 10:15 AM Medical Record Number: [id number] Patient Account Number: [id number] Date of Birth/Sex: [birth year] (45 y.o. M) Treating RN: Hansel Feinstein Primary Care Ted Goodner: Barbette Reichmann Other Clinician: Referring Keli Buehner: Festus Barren Treating Hadrian Yarbrough/Extender: Rowan Blase in Treatment: 0 Vital Signs Height(in): 70 Pulse(bpm): 78 Weight(lbs): 215 Blood Pressure(mmHg): 170/95 Body Mass Index(BMI): 31 Temperature(F): 97.9 Respiratory Rate(breaths/min): 16 Photos: [N/A:N/A] Wound Location: Left Upper Leg Left, Lateral Upper Leg N/A Wounding Event: Blister Blister N/A Primary Etiology: Pressure Ulcer Pressure Ulcer N/A Secondary Etiology: Diabetic Wound/Ulcer of the Lower Diabetic Wound/Ulcer of the Lower N/A Extremity Extremity Comorbid History: Coronary Artery Disease, Coronary Artery Disease, N/A Hypertension, Type II Diabetes, Hypertension, Type II Diabetes, Neuropathy Neuropathy Date Acquired: 08/19/2021 07/20/2021 N/A Weeks of Treatment: 0 0 N/A Wound Status: Open Open N/A Clustered Wound: Yes Yes N/A Measurements L x W x D (cm) 6.5x5.5x0.3 2.6x3.4x0.1 N/A Area (cm) : 28.078 6.943 N/A Volume (cm) : 8.423 0.694 N/A Classification: Category/Stage III Unstageable/Unclassified N/A Exudate Amount: Large Medium N/A Exudate Type: Serosanguineous Serosanguineous N/A Exudate Color: red, brown red, brown N/A Granulation Amount: Small (1-33%) None Present (0%) N/A Granulation Quality: Red, Pink N/A N/A Necrotic Amount: Large (67-100%) Large (67-100%) N/A Necrotic  Tissue: Eschar, Adherent Slough Eschar N/A Exposed Structures: Fat Layer (Subcutaneous Tissue): Fat Layer (Subcutaneous Tissue): N/A Yes Yes Fascia: No Fascia: No Tendon: No Tendon: No Muscle: No Muscle: No Joint: No Joint: No Bone: No Bone: No Treatment Notes Electronic Signature(s) Signed: 09/19/2021 2:28:05 PM By: Hansel Feinstein Entered By: Hansel Feinstein on 09/19/2021 11:27:02 Richard Mendoza ([id number]) -------------------------------------------------------------------------------- Multi-Disciplinary Care Plan Details Patient Name: Richard Mendoza Date of Service: 09/19/2021 10:15 AM Medical Record Number: [id number] Patient Account Number: [id number] Date of Birth/Sex: 12-08-1975 (45 y.o. M) Treating RN: Hansel Feinstein Primary Care Takenya Travaglini: Barbette Reichmann Other Clinician: Referring Vernard Gram: Festus Barren Treating Chanin Frumkin/Extender: Rowan Blase in Treatment: 0 Active Inactive Orientation to the Wound Care Program Nursing Diagnoses: Knowledge deficit related to the wound healing center program Goals: Patient/caregiver will verbalize understanding of the Wound Healing Center Program Date Initiated: 09/19/2021 Target Resolution Date: 10/03/2021 Goal Status: Active Interventions:  Provide education on orientation to the wound center Notes: Wound/Skin Impairment Nursing Diagnoses: Impaired tissue integrity Knowledge deficit related to smoking impact on wound healing Knowledge deficit related to ulceration/compromised skin integrity Goals: Patient/caregiver will verbalize understanding of skin care regimen Date Initiated: 09/19/2021 Target Resolution Date: 10/08/2021 Goal Status: Active Ulcer/skin breakdown will have a volume reduction of 30% by week 4 Date Initiated: 09/19/2021 Target Resolution Date: 10/17/2021 Goal Status: Active Ulcer/skin breakdown will have a volume reduction of 50% by week 8 Date Initiated: 09/19/2021 Target Resolution Date: 11/14/2021 Goal  Status: Active Ulcer/skin breakdown will have a volume reduction of 80% by week 12 Date Initiated: 09/19/2021 Target Resolution Date: 12/12/2021 Goal Status: Active Ulcer/skin breakdown will heal within 14 weeks Date Initiated: 09/19/2021 Target Resolution Date: 12/30/2021 Goal Status: Active Interventions: Assess patient/caregiver ability to obtain necessary supplies Assess patient/caregiver ability to perform ulcer/skin care regimen upon admission and as needed Assess ulceration(s) every visit Notes: Electronic Signature(s) Signed: 09/19/2021 2:28:05 PM By: Donnamarie Poag Entered By: Donnamarie Poag on 09/19/2021 11:26:27 Richard Mendoza ([id number]) -------------------------------------------------------------------------------- Pain Assessment Details Patient Name: Richard Mendoza Date of Service: 09/19/2021 10:15 AM Medical Record Number: [id number] Patient Account Number: [id number] Date of Birth/Sex: Mar 23, 1976 (45 y.o. M) Treating RN: Donnamarie Poag Primary Care Ashyia Schraeder: Tracie Harrier Other Clinician: Referring Ife Vitelli: Leotis Pain Treating Tosca Pletz/Extender: Skipper Cliche in Treatment: 0 Active Problems Location of Pain Severity and Description of Pain Patient Has Paino Yes Site Locations Pain Location: Generalized Pain, Pain in Ulcers Rate the pain. Current Pain Level: 4 Pain Management and Medication Current Pain Management: Electronic Signature(s) Signed: 09/19/2021 2:28:05 PM By: Donnamarie Poag Entered By: Donnamarie Poag on 09/19/2021 10:42:36 Richard Mendoza ([id number]) -------------------------------------------------------------------------------- Patient/Caregiver Education Details Patient Name: Richard Mendoza Date of Service: 09/19/2021 10:15 AM Medical Record Number: [id number] Patient Account Number: [id number] Date of Birth/Gender: 10-14-1976 (45 y.o. M) Treating RN: Donnamarie Poag Primary Care Physician: Tracie Harrier Other Clinician: Referring Physician: Leotis Pain Treating Physician/Extender: Skipper Cliche in Treatment: 0 Education Assessment Education Provided To: Patient Education Topics Provided Basic Hygiene: Infection: Nutrition: Offloading: Welcome [facility]: Wound Debridement: Wound/Skin Impairment: Electronic Signature(s) Signed: 09/19/2021 2:28:05 PM By: Donnamarie Poag Entered By: Donnamarie Poag on 09/19/2021 11:52:03 Richard Mendoza ([id number]) -------------------------------------------------------------------------------- Wound Assessment Details Patient Name: Richard Mendoza Date of Service: 09/19/2021 10:15 AM Medical Record Number: [id number] Patient Account Number: [id number] Date of Birth/Sex: 05-04-1976 (45 y.o. M) Treating RN: Donnamarie Poag Primary Care Bexley Mclester: Tracie Harrier Other Clinician: Referring Monicka Cyran: Leotis Pain Treating Sabriya Yono/Extender: Skipper Cliche in Treatment: 0 Wound Status Wound Number: 1 Primary Etiology: Pressure Ulcer Wound Location: Left Upper Leg Secondary Diabetic Wound/Ulcer of the Lower Extremity Etiology: Wounding Event: Blister Wound Status: Open Date Acquired: 08/19/2021 Comorbid Coronary Artery Disease, Hypertension, Type II Weeks Of Treatment: 0 History: Diabetes, Neuropathy Clustered Wound: No Photos Wound Measurements Length: (cm) 6.5 Width: (cm) 5.5 Depth: (cm) 0.3 Area: (cm) 28.078 Volume: (cm) 8.423 % Reduction in Area: % Reduction in Volume: Tunneling: No Undermining: No Wound Description Classification: Category/Stage III Exudate Amount: Large Exudate Type: Serosanguineous Exudate Color: red, brown Foul Odor After Cleansing: No Slough/Fibrino Yes Wound Bed Granulation Amount: Small (1-33%) Exposed Structure Granulation Quality: Red, Pink Fascia Exposed: No Necrotic Amount: Large (67-100%) Fat Layer (Subcutaneous Tissue) Exposed: Yes Necrotic Quality: Eschar, Adherent Slough Tendon Exposed: No Muscle Exposed: No Joint Exposed:  No Bone Exposed: No Electronic Signature(s) Signed: 09/19/2021 2:28:05 PM By: Donnamarie Poag Entered By: Donnamarie Poag on 09/19/2021 11:00:20 Richard Mendoza, Richard Mendoza ([id number]) -------------------------------------------------------------------------------- Vitals  Details Patient Name: Richard Mendoza, Richard Mendoza Date of Service: 09/19/2021 10:15 AM Medical Record Number: [id number] Patient Account Number: [id number] Date of Birth/Sex: Feb 28, 1976 (45 y.o. M) Treating RN: Donnamarie Poag Primary Care Francene Mcerlean: Tracie Harrier Other Clinician: Referring Makesha Belitz: Leotis Pain Treating Tomeko Scoville/Extender: Skipper Cliche in Treatment: 0 Vital Signs Time Taken: 10:40 Temperature (F): 97.9 Height (in): 70 Pulse (bpm): 78 Source: Stated Respiratory Rate (breaths/min): 16 Weight (lbs): 215 Blood Pressure (mmHg): 170/95 Source: Stated Reference Range: 80 - 120 mg / dl Body Mass Index (BMI): 30.8 Notes Left BKA, did not stand on scale to weigh Electronic Signature(s) Signed: 09/19/2021 2:28:05 PM By: Donnamarie Poag Entered ByDonnamarie Poag on 09/19/2021 10:43:33

## 2021-09-19 NOTE — Progress Notes (Signed)
RAYANE, GALLARDO ([id number]) Visit Report for 09/19/2021 Abuse/Suicide Risk Screen Details Patient Name: Richard Mendoza, Richard Mendoza Date of Service: 09/19/2021 10:15 AM Medical Record Number: [id number] Patient Account Number: [id number] Date of Birth/Sex: [birth year] (45 y.o. M) Treating RN: Hansel Feinstein Primary Care Romesha Scherer: Barbette Reichmann Other Clinician: Referring Lochlin Eppinger: Festus Barren Treating Abdel Effinger/Extender: Rowan Blase in Treatment: 0 Abuse/Suicide Risk Screen Items Answer ABUSE RISK SCREEN: Has anyone close to you tried to hurt or harm you recentlyo No Do you feel uncomfortable with anyone in your familyo No Has anyone forced you do things that you didnot want to doo No Electronic Signature(s) Signed: 09/19/2021 2:28:05 PM By: Hansel Feinstein Entered By: Hansel Feinstein on 09/19/2021 10:46:05 Richard Mendoza ([id number]) -------------------------------------------------------------------------------- Activities of Daily Living Details Patient Name: Richard Mendoza Date of Service: 09/19/2021 10:15 AM Medical Record Number: [id number] Patient Account Number: [id number] Date of Birth/Sex: 29-May-1976 (45 y.o. M) Treating RN: Hansel Feinstein Primary Care Michaelpaul Apo: Barbette Reichmann Other Clinician: Referring Damyah Gugel: Festus Barren Treating Coen Miyasato/Extender: Rowan Blase in Treatment: 0 Activities of Daily Living Items Answer Activities of Daily Living (Please select one for each item) Drive Automobile Completely Able Take Medications Completely Able Use Telephone Completely Able Care for Appearance Completely Able Use Toilet Completely Able Bath / Shower Completely Able Dress Self Completely Able Feed Self Completely Able Walk Completely Able Get In / Out Bed Completely Able Housework Completely Able Prepare Meals Completely Able Handle Money Completely Able Shop for Self Completely Able Electronic Signature(s) Signed: 09/19/2021 2:28:05 PM By: Hansel Feinstein Entered By: Hansel Feinstein  on 09/19/2021 10:44:16 Richard Mendoza ([id number]) -------------------------------------------------------------------------------- Education Screening Details Patient Name: Richard Mendoza Date of Service: 09/19/2021 10:15 AM Medical Record Number: [id number] Patient Account Number: [id number] Date of Birth/Sex: 04/23/1976 (45 y.o. M) Treating RN: Hansel Feinstein Primary Care Malayjah Otoole: Barbette Reichmann Other Clinician: Referring Makinzi Prieur: Festus Barren Treating Monya Kozakiewicz/Extender: Rowan Blase in Treatment: 0 Primary Learner Assessed: Patient Learning Preferences/Education Level/Primary Language Learning Preference: Explanation Highest Education Level: College or Above Preferred Language: English Cognitive Barrier Language Barrier: No Translator Needed: No Memory Deficit: No Emotional Barrier: No Cultural/Religious Beliefs Affecting Medical Care: No Physical Barrier Impaired Vision: No Impaired Hearing: No Decreased Hand dexterity: No Knowledge/Comprehension Knowledge Level: High Comprehension Level: High Ability to understand written instructions: High Ability to understand verbal instructions: High Motivation Anxiety Level: Calm Cooperation: Cooperative Education Importance: Acknowledges Need Interest in Health Problems: Asks Questions Perception: Coherent Willingness to Engage in Self-Management High Activities: Readiness to Engage in Self-Management High Activities: Electronic Signature(s) Signed: 09/19/2021 2:28:05 PM By: Hansel Feinstein Entered ByHansel Feinstein on 09/19/2021 10:44:36 Richard Mendoza ([id number]) -------------------------------------------------------------------------------- Fall Risk Assessment Details Patient Name: Richard Mendoza Date of Service: 09/19/2021 10:15 AM Medical Record Number: [id number] Patient Account Number: [id number] Date of Birth/Sex: December 12, 1975 (45 y.o. M) Treating RN: Hansel Feinstein Primary Care Salar Molden: Barbette Reichmann Other  Clinician: Referring Merritt Kibby: Festus Barren Treating Aima Mcwhirt/Extender: Rowan Blase in Treatment: 0 Fall Risk Assessment Items Have you had 2 or more falls in the last 12 monthso 0 No Have you had any fall that resulted in injury in the last 12 monthso 0 No FALLS RISK SCREEN History of falling - immediate or within 3 months 0 No Secondary diagnosis (Do you have 2 or more medical diagnoseso) 15 Yes Ambulatory aid None/bed rest/wheelchair/nurse 0 Yes Crutches/cane/walker 0 No Furniture 0 No Intravenous therapy Access/Saline/Heparin Lock 0 No Gait/Transferring Normal/ bed rest/ wheelchair 0 Yes Weak (short steps with or without shuffle, stooped but able to lift head while walking,  may 0 No seek support from furniture) Impaired (short steps with shuffle, may have difficulty arising from chair, head down, impaired 0 No balance) Mental Status Oriented to own ability 0 Yes Electronic Signature(s) Signed: 09/19/2021 2:28:05 PM By: Hansel Feinstein Entered By: Hansel Feinstein on 09/19/2021 10:44:52 Natzke, Casimiro Needle ([id number]) -------------------------------------------------------------------------------- Foot Assessment Details Patient Name: Richard Mendoza Date of Service: 09/19/2021 10:15 AM Medical Record Number: [id number] Patient Account Number: [id number] Date of Birth/Sex: [birth year] (45 y.o. 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 Site Locations + = Sensation present, - = Sensation absent, C = Callus, U = Ulcer R = Redness, W = Warmth, M = Maceration, PU = Pre-ulcerative lesion F = Fissure, S = Swelling, D = Dryness Assessment Right: Left: Other Deformity: No Prior Foot Ulcer: No Prior Amputation: No Charcot Joint: No Ambulatory Status: Gait: Electronic  Signature(s) Signed: 09/19/2021 2:28:05 PM By: 13/11/2020 Entered By: Hansel Feinstein on 09/19/2021 10:45:42 Rengel, 13/11/2020 (Casimiro Needle) -------------------------------------------------------------------------------- Nutrition Risk Screening Details Patient Name: [id number] Date of Service: 09/19/2021 10:15 AM Medical Record Number: 13/11/2020 Patient Account Number: [id number] Date of Birth/Sex: Dec 11, 1975 (45 y.o. M) Treating RN: 54 Primary Care Sheriece Jefcoat: Hansel Feinstein Other Clinician: Referring Latasha Puskas: Barbette Reichmann Treating Ashely Joshua/Extender: Festus Barren in Treatment: 0 Height (in): 70 Weight (lbs): 215 Body Mass Index (BMI): 30.8 Nutrition Risk Screening Items Score Screening NUTRITION RISK SCREEN: I have an illness or condition that made me change the kind and/or amount of food I eat 0 No I eat fewer than two meals per day 0 No I eat few fruits and vegetables, or milk products 0 No I have three or more drinks of beer, liquor or wine almost every day 0 No I have tooth or mouth problems that make it hard for me to eat 0 No I don't always have enough money to buy the food I need 0 No I eat alone most of the time 0 No I take three or more different prescribed or over-the-counter drugs a day 1 Yes Without wanting to, I have lost or gained 10 pounds in the last six months 0 No I am not always physically able to shop, cook and/or feed myself 0 No Nutrition Protocols Good Risk Protocol Moderate Risk Protocol 0 Provide education on nutrition High Risk Proctocol Risk Level: Good Risk Score: 1 Electronic Signature(s) Signed: 09/19/2021 2:28:05 PM By: 13/11/2020 Entered ByHansel Feinstein on 09/19/2021 10:45:12

## 2021-09-21 ENCOUNTER — Ambulatory Visit: Payer: Medicaid Other | Admitting: Physician Assistant

## 2021-09-21 NOTE — Progress Notes (Signed)
ARCHIVALDO, SURETTE ([id number]) Visit Report for 09/19/2021 Chief Complaint Document Details Patient Name: Richard, Mendoza Date of Service: 09/19/2021 10:15 AM Medical Record Number: [id number] Patient Account Number: [id number] Date of Birth/Sex: March 02, 1976 (45 y.o. M) Treating RN: Donnamarie Poag Primary Care Provider: Tracie Harrier Other Clinician: Referring Provider: Leotis Pain Treating Provider/Extender: Skipper Cliche in Treatment: 0 Information Obtained from: Patient Chief Complaint Left LE Ulcers due to prosthesis pressure and rubbing Electronic Signature(s) Signed: 09/19/2021 11:23:25 AM By: Worthy Keeler PA-C Entered By: Worthy Keeler on 09/19/2021 11:23:24 Richard Mendoza ([id number]) -------------------------------------------------------------------------------- Debridement Details Patient Name: Richard Mendoza Date of Service: 09/19/2021 10:15 AM Medical Record Number: [id number] Patient Account Number: [id number] Date of Birth/Sex: [birth year] (45 y.o. M) Treating RN: Donnamarie Poag Primary Care Provider: Tracie Harrier Other Clinician: Referring Provider: Leotis Pain Treating Provider/Extender: Skipper Cliche in Treatment: 0 Debridement Performed for Wound #1 Left,Medial Upper Leg Assessment: Performed By: Physician Tommie Sams., PA-C Debridement Type: Debridement Severity of Tissue Pre Debridement: Fat layer exposed Level of Consciousness (Pre- Awake and Alert procedure): Pre-procedure Verification/Time Out Yes - 11:29 Taken: Start Time: 11:29 Pain Control: Lidocaine Total Area Debrided (L x W): 5 (cm) x 5 (cm) = 25 (cm) Tissue and other material Viable, Non-Viable, Eschar, Slough, Subcutaneous, Slough debrided: Level: Skin/Subcutaneous Tissue Debridement Description: Excisional Instrument: Forceps, Scissors Bleeding: Moderate Hemostasis Achieved: Pressure Response to Treatment: Procedure was tolerated well Level of Consciousness (Post- Awake and  Alert procedure): Post Debridement Measurements of Total Wound Length: (cm) 6.5 Stage: Category/Stage III Width: (cm) 5.5 Depth: (cm) 0.3 Volume: (cm) 8.423 Character of Wound/Ulcer Post Debridement: Improved Severity of Tissue Post Debridement: Fat layer exposed Post Procedure Diagnosis Same as Pre-procedure Electronic Signature(s) Signed: 09/19/2021 2:28:05 PM By: Donnamarie Poag Signed: 09/20/2021 6:58:05 PM By: Worthy Keeler PA-C Entered By: Donnamarie Poag on 09/19/2021 11:38:30 Richard Mendoza ([id number]) -------------------------------------------------------------------------------- HPI Details Patient Name: Richard Mendoza Date of Service: 09/19/2021 10:15 AM Medical Record Number: [id number] Patient Account Number: [id number] Date of Birth/Sex: Aug 02, 1976 (45 y.o. M) Treating RN: Donnamarie Poag Primary Care Provider: Tracie Harrier Other Clinician: Referring Provider: Leotis Pain Treating Provider/Extender: Skipper Cliche in Treatment: 0 History of Present Illness HPI Description: 09/19/2021 upon evaluation today patient presents for initial inspection here in our clinic concerning issues he has been having with wounds over the medial and lateral portion of his knee that were caused by pressure from his prosthesis. Unfortunately this had shrunk over time as most do and he was just before getting a new size or rather it resized when this started causing the problem in September he had first got the prosthesis in April. Nonetheless he has not been able to wear the prosthesis now on this BKA due to the fact that again he has the wounds. 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 He does have hypertension. In general the good news is this does not appear to be infected. Electronic Signature(s) Signed: 09/20/2021 6:56:09 PM By: Worthy Keeler PA-C Entered By: Worthy Keeler on 09/20/2021  18:56:09 Richard Mendoza, Richard Mendoza ([id number]) -------------------------------------------------------------------------------- Physical Exam Details Patient Name: Richard Mendoza Date of Service: 09/19/2021 10:15 AM Medical Record Number: [id number] Patient Account Number: [id number] Date of Birth/Sex: 1976/01/22 (45 y.o. M) Treating RN: Donnamarie Poag Primary Care Provider: Tracie Harrier Other Clinician: Referring Provider: Leotis Pain Treating Provider/Extender: Skipper Cliche in Treatment: 0 Constitutional patient is hypertensive.. pulse regular and within  target range for patient.Marland Kitchen respirations regular, non-labored and within target range for patient.Marland Kitchen temperature within target range for patient.. Well-nourished and well-hydrated in no acute distress. Eyes conjunctiva clear no eyelid edema noted. pupils equal round and reactive to light and accommodation. Ears, Nose, Mouth, and Throat no gross abnormality of ear auricles or external auditory canals. normal hearing noted during conversation. mucus membranes moist. Respiratory normal breathing without difficulty. Psychiatric this patient is able to make decisions and demonstrates good insight into disease process. Alert and Oriented x 3. pleasant and cooperative. Notes Upon inspection patient's wound bed actually did showed signs of necrotic tissue on the medial aspect of the left knee that is can require sharp debridement. We discussed that today. Subsequently he was in agreement with me proceeding with the debridement today and I was able to remove this he tolerated it with some pain but we were able to get through completely. Post debridement this appears to be doing better there is still quite a bit of feeling to go but nonetheless we have a much better way to achieve that without having all the eschar in place. Electronic Signature(s) Signed: 09/20/2021 6:56:40 PM By: Worthy Keeler PA-C Entered By: Worthy Keeler on 09/20/2021  18:56:40 Richard Mendoza ([id number]) -------------------------------------------------------------------------------- Physician Orders Details Patient Name: Richard Mendoza Date of Service: 09/19/2021 10:15 AM Medical Record Number: [id number] Patient Account Number: [id number] Date of Birth/Sex: 1976-05-17 (45 y.o. M) Treating RN: Donnamarie Poag Primary Care Provider: Tracie Harrier Other Clinician: Referring Provider: Leotis Pain Treating Provider/Extender: Skipper Cliche in Treatment: 0 Verbal / Phone Orders: No Diagnosis Coding ICD-10 Coding Code Description E11.622 Type 2 diabetes mellitus with other skin ulcer L98.492 Non-pressure chronic ulcer of skin of other sites with fat layer exposed Z89.512 Acquired absence of left leg below knee I10 Essential (primary) hypertension Follow-up Appointments o Return Appointment in 1 week. o Nurse Visit as needed Bathing/ Shower/ Hygiene o Clean wound with Normal Saline or wound cleanser. - keep dressing dry o No tub bath. Edema Control - Lymphedema / Segmental Compressive Device / Other o Tubigrip single layer applied. - left leg Additional Orders / Instructions o Follow Nutritious Diet and Increase Protein Intake o Other: - continue to check blood sugar Medications-Please add to medication list. o Take one 500mg  Tylenol (Acetaminophen) and one 200mg  Motrin (Ibuprofen) every 6 hours for pain. Do not take ibuprofen if you are on blood thinners or have stomach ulcers. o P.O. Antibiotics - continue antibiotics as prescribed Wound Treatment Wound #1 - Upper Leg Wound Laterality: Left, Medial Primary Dressing: Hydrofera Blue Ready Transfer Foam, 4x5 (in/in) 2 x Per Week/15 Days Discharge Instructions: Apply Hydrofera Blue Ready to wound bed as directed Secondary Dressing: ABD Pad 5x9 (in/in) 2 x Per Week/15 Days Discharge Instructions: Cover with ABD pad Secondary Dressing: Foam Dressing, 4x4 (in/in) 2 x Per Week/15  Days Discharge Instructions: on top to pad around wound Secured With: 91M Medipore H Soft Cloth Surgical Tape, 2x2 (in/yd) 2 x Per Week/15 Days Secured With: [employer] Sterile or Non-Sterile 6-ply 4.5x4 (yd/yd) 2 x Per Week/15 Days Discharge Instructions: Apply Kerlix as directed Electronic Signature(s) Signed: 09/19/2021 2:28:05 PM By: Donnamarie Poag Signed: 09/20/2021 6:58:05 PM By: Worthy Keeler PA-C Entered By: Donnamarie Poag on 09/19/2021 11:44:15 Richard Mendoza ([id number]) -------------------------------------------------------------------------------- Problem List Details Patient Name: Richard Mendoza Date of Service: 09/19/2021 10:15 AM Medical Record Number: [id number] Patient Account Number: [id number] Date of Birth/Sex: May 12, 1976 (45 y.o. M) Treating RN: Donnamarie Poag Primary Care  Provider: Tracie Harrier Other Clinician: Referring Provider: Leotis Pain Treating Provider/Extender: Skipper Cliche in Treatment: 0 Active Problems ICD-10 Encounter Code Description Active Date MDM Diagnosis E11.622 Type 2 diabetes mellitus with other skin ulcer 09/19/2021 No Yes L98.492 Non-pressure chronic ulcer of skin of other sites with fat layer exposed 09/19/2021 No Yes Z89.512 Acquired absence of left leg below knee 09/19/2021 No Yes I10 Essential (primary) hypertension 09/19/2021 No Yes Inactive Problems Resolved Problems Electronic Signature(s) Signed: 09/19/2021 11:22:41 AM By: Worthy Keeler PA-C Entered By: Worthy Keeler on 09/19/2021 11:22:41 Richard Mendoza ([id number]) -------------------------------------------------------------------------------- Progress Note Details Patient Name: Richard Mendoza Date of Service: 09/19/2021 10:15 AM Medical Record Number: [id number] Patient Account Number: [id number] Date of Birth/Sex: [birth year] (45 y.o. M) Treating RN: Donnamarie Poag Primary Care Provider: Tracie Harrier Other Clinician: Referring Provider: Leotis Pain Treating  Provider/Extender: Skipper Cliche in Treatment: 0 Subjective Chief Complaint Information obtained from Patient Left LE Ulcers due to prosthesis pressure and rubbing History of Present Illness (HPI) 09/19/2021 upon evaluation today patient presents for initial inspection here in our clinic concerning issues he has been having with wounds over the medial and lateral portion of his knee that were caused by pressure from his prosthesis. Unfortunately this had shrunk over time as most do and he was just before getting a new size or rather it resized when this started causing the problem in September he had first got the prosthesis in April. Nonetheless he has not been able to wear the prosthesis now on this BKA due to the fact that again he has the wounds. 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 He does have hypertension. In general the good news is this does not appear to be infected. Patient History Unable to Obtain Patient History due to Altered Mental Status. Information obtained from Patient. Allergies No Known Allergies Social History Former smoker - ended on 11/19/2018, Marital Status - Married, Alcohol Use - Rarely, Drug Use - No History, Caffeine Use - Daily. Medical History Cardiovascular Patient has history of Coronary Artery Disease, Hypertension Endocrine Patient has history of Type II Diabetes Neurologic Patient has history of Neuropathy - all extremities Patient is treated with Insulin. Blood sugar is tested. Blood sugar results noted at the following times: Breakfast - not today. Review of Systems (ROS) Constitutional Symptoms (General Health) Denies complaints or symptoms of Fatigue, Fever, Chills, Marked Weight Change. Eyes Denies complaints or symptoms of Dry Eyes, Vision Changes, Glasses / Contacts. Ear/Nose/Mouth/Throat Denies complaints or symptoms of Difficult clearing ears,  Sinusitis. Hematologic/Lymphatic Denies complaints or symptoms of Bleeding / Clotting Disorders, Human Immunodeficiency Virus. Respiratory Denies complaints or symptoms of Chronic or frequent coughs, Shortness of Breath. Cardiovascular Denies complaints or symptoms of LE edema. Gastrointestinal Denies complaints or symptoms of Frequent diarrhea, Nausea, Vomiting. Endocrine Denies complaints or symptoms of Thyroid disease. 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 Integumentary (Skin) Denies complaints or symptoms of Wounds, Bleeding or bruising tendency, Breakdown, Swelling. Musculoskeletal Denies complaints or symptoms of Muscle Pain, Muscle Weakness. Neurologic 10/2020 left BKA Psychiatric Denies complaints or symptoms of Anxiety, Claustrophobia. Richard Mendoza, Richard Mendoza ([id number]) Objective Constitutional patient is hypertensive.. pulse regular and within target range for patient.Marland Kitchen respirations regular, non-labored and within target range for patient.Marland Kitchen temperature within target range for patient.. Well-nourished and well-hydrated in no acute distress. Vitals Time Taken: 10:40 AM, Height: 70 in,  Source: Stated, Weight: 215 lbs, Source: Stated, BMI: 30.8, Temperature: 97.9 F, Pulse: 78 bpm, Respiratory Rate: 16 breaths/min, Blood Pressure: 170/95 mmHg. General Notes: Left BKA, did not stand on scale to weigh Eyes conjunctiva clear no eyelid edema noted. pupils equal round and reactive to light and accommodation. Ears, Nose, Mouth, and Throat no gross abnormality of ear auricles or external auditory canals. normal hearing noted during conversation. mucus membranes moist. Respiratory normal breathing without difficulty. Psychiatric this patient is able to make decisions and demonstrates good insight into disease process. Alert and Oriented x 3. pleasant and cooperative. General Notes: Upon inspection patient's  wound bed actually did showed signs of necrotic tissue on the medial aspect of the left knee that is can require sharp debridement. We discussed that today. Subsequently he was in agreement with me proceeding with the debridement today and I was able to remove this he tolerated it with some pain but we were able to get through completely. Post debridement this appears to be doing better there is still quite a bit of feeling to go but nonetheless we have a much better way to achieve that without having all the eschar in place. Integumentary (Hair, Skin) Wound #1 status is Open. Original cause of wound was Blister. The date acquired was: 08/19/2021. The wound is located on the Left,Medial Upper Leg. The wound measures 6.5cm length x 5.5cm width x 0.3cm depth; 28.078cm^2 area and 8.423cm^3 volume. There is Fat Layer (Subcutaneous Tissue) exposed. There is no tunneling or undermining noted. There is a large amount of serosanguineous drainage noted. There is small (1-33%) red, pink granulation within the wound bed. There is a large (67-100%) amount of necrotic tissue within the wound bed including Eschar and Adherent Slough. 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 There was a Excisional Skin/Subcutaneous Tissue Debridement with a total area of 25 sq cm performed by Tommie Sams., PA-C. With the following instrument(s): Forceps, and Scissors to remove Viable and Non-Viable tissue/material. Material removed includes Eschar, Subcutaneous Tissue, and Slough after achieving pain control using Lidocaine. A time out was conducted at 11:29, prior to the  start of the procedure. A Moderate amount of bleeding was controlled with Pressure. The procedure was tolerated well. Post Debridement Measurements: 6.5cm length x 5.5cm width x 0.3cm depth; 8.423cm^3 volume. Post debridement Stage noted as Category/Stage III. Character of Wound/Ulcer Post Debridement is improved. Severity of Tissue Post Debridement is: Fat layer exposed. Post procedure Diagnosis Wound #1: Same as Pre-Procedure Richard Mendoza, Richard Mendoza ([id number]) Plan Follow-up Appointments: Return Appointment in 1 week. Nurse Visit as needed Bathing/ Shower/ Hygiene: Clean wound with Normal Saline or wound cleanser. - keep dressing dry No tub bath. Edema Control - Lymphedema / Segmental Compressive Device / Other: Tubigrip single layer applied. - left leg Additional Orders / Instructions: Follow Nutritious Diet and Increase Protein Intake Other: - continue to check blood sugar Medications-Please add to medication list.: Take one 500mg  Tylenol (Acetaminophen) and one 200mg  Motrin (Ibuprofen) every 6 hours for pain. Do not take ibuprofen if you are on blood thinners or have stomach ulcers. P.O. Antibiotics - continue antibiotics as prescribed WOUND #1: - Upper Leg Wound Laterality: Left, Medial Primary  Dressing: Hydrofera Blue Ready Transfer Foam, 4x5 (in/in) 2 x Per Week/15 Days Discharge Instructions: Apply Hydrofera Blue Ready to wound bed as directed Secondary Dressing: ABD Pad 5x9 (in/in) 2 x Per Week/15 Days Discharge Instructions: Cover with ABD pad Secondary Dressing: Foam Dressing, 4x4 (in/in) 2 x Per Week/15 Days Discharge Instructions: on top to pad around wound Secured With: 24M Medipore H Soft Cloth Surgical Tape, 2x2 (in/yd) 2 x Per Week/15 Days Secured With: [employer] Sterile or Non-Sterile 6-ply 4.5x4 (yd/yd) 2 x Per Week/15 Days Discharge Instructions: Apply Kerlix as directed 1. Would recommend currently that we actually go ahead and initiate treatment with a Hydrofera Blue  dressing followed by ABD pad and roll gauze to secure in place I think this is good to be the best option. 2. I am also can recommend that he avoid any pressure to this area to try to keep it as safe as possible. 3. We will use a Tubigrip over top to try to help with some of the edema control I think that is a good direction to take at this point. We will see patient back for reevaluation in 1 week here in the clinic. If anything worsens or changes patient will contact our office for additional recommendations. Electronic Signature(s) Signed: 09/20/2021 6:57:24 PM By: Lenda Kelp PA-C Entered By: Lenda Kelp on 09/20/2021 18:57:24 Richard Mendoza, Richard Mendoza ([id number]) -------------------------------------------------------------------------------- ROS/PFSH Details Patient Name: Richard Mendoza Date of Service: 09/19/2021 10:15 AM Medical Record Number: [id number] Patient Account Number: [id number] Date of Birth/Sex: 07-20-1976 (45 y.o. M) Treating RN: Hansel Feinstein Primary Care Provider: Barbette Reichmann Other Clinician: Referring Provider: Festus Barren Treating Provider/Extender: Rowan Blase in Treatment: 0 Unable to Obtain Patient History due to oo Altered Mental Status Information Obtained From Patient Constitutional Symptoms (General Health) Complaints and Symptoms: Negative for: Fatigue; Fever; Chills; Marked Weight Change Eyes Complaints and Symptoms: Negative for: Dry Eyes; Vision Changes; Glasses / Contacts Ear/Nose/Mouth/Throat Complaints and Symptoms: Negative for: Difficult clearing ears; Sinusitis Hematologic/Lymphatic Complaints and Symptoms: Negative for: Bleeding / Clotting Disorders; Human Immunodeficiency Virus Respiratory Complaints and Symptoms: Negative for: Chronic or frequent coughs; Shortness of Breath Cardiovascular Complaints and Symptoms: Negative for: LE edema Medical History: Positive for: Coronary Artery Disease;  Hypertension Gastrointestinal Complaints and Symptoms: Negative for: Frequent diarrhea; Nausea; Vomiting Endocrine Complaints and Symptoms: Negative for: Thyroid disease Medical History: Positive for: Type II Diabetes Time with diabetes: 2005 Treated with: Insulin Blood sugar tested every day: Yes Tested : usually have DexCom Blood sugar testing results: Breakfast: not today Immunological Apuzzo, Maddyx ([id number]) Complaints and Symptoms: Negative for: Hives; Itching Integumentary (Skin) Complaints and Symptoms: Negative for: Wounds; Bleeding or bruising tendency; Breakdown; Swelling Musculoskeletal Complaints and Symptoms: Negative for: Muscle Pain; Muscle Weakness Psychiatric Complaints and Symptoms: Negative for: Anxiety; Claustrophobia Genitourinary Complaints and Symptoms: Review of System Notes: monitor kidney function 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 Neurologic Complaints and Symptoms: Review of System Notes: 10/2020 left BKA Medical History: Positive for: Neuropathy - all extremities Oncologic Immunizations Pneumococcal Vaccine: Received Pneumococcal Vaccination: No Implantable Devices None Family and Social History Former smoker - ended on 11/19/2018; Marital Status - Married; Alcohol Use: Rarely; Drug Use: No History; Caffeine Use: Daily; Financial Concerns: No; Food, Clothing or Shelter Needs: No; Support System Lacking: No; Transportation Concerns: No Electronic Signature(s) Signed: 09/19/2021 2:28:05 PM By: Hansel Feinstein Signed: 09/20/2021 6:58:05 PM By: Lenda Kelp PA-C Entered By: Hansel Feinstein on 09/19/2021 10:50:20 Richard Mendoza ([id number]) -------------------------------------------------------------------------------- SuperBill Details Patient Name: Richard Mendoza Date of Service:  09/19/2021 Medical Record Number: [id number] Patient Account Number: [id number] Date of Birth/Sex: Aug 15, 1976 (44 y.o. M) Treating RN: Donnamarie Poag Primary Care Provider: Tracie Harrier Other Clinician: Referring Provider: Leotis Pain Treating Provider/Extender: Skipper Cliche in Treatment: 0 Diagnosis Coding ICD-10 Codes Code Description E11.622 Type 2 diabetes mellitus with other skin ulcer L98.492 Non-pressure chronic ulcer of skin of other sites with fat layer exposed Z89.512 Acquired absence of left leg below knee I10 Essential (primary) hypertension Facility Procedures CPT4 Code: [id number] Description: [place] VISIT-LEV 3 EST PT Modifier: Quantity: 1 CPT4 Code: [id number] Description: [id number] - DEB SUBQ TISSUE 20 SQ CM/< Modifier: Quantity: 1 CPT4 Code: Description: ICD-10 Diagnosis Description L98.492 Non-pressure chronic ulcer of skin of other sites with fat layer expose Modifier: d Quantity: CPT4 Code: [id number] Description: [id number] - DEB SUBQ TISS EA ADDL 20CM Modifier: Quantity: 1 CPT4 Code: Description: ICD-10 Diagnosis Description L98.492 Non-pressure chronic ulcer of skin of other sites with fat layer expose Modifier: d Quantity: Physician Procedures CPT4 Code: [id number] Description: [id number] - WC PHYS LEVEL 4 - NEW PT Modifier: 25 Quantity: 1 CPT4 Code: Description: ICD-10 Diagnosis Description E11.622 Type 2 diabetes mellitus with other skin ulcer L98.492 Non-pressure chronic ulcer of skin of other sites with fat layer exposed Z89.512 Acquired absence of left leg below knee I10 Essential (primary)  hypertension Modifier: Quantity: CPT4 [id number] Description: 11042 - WC PHYS SUBQ TISS 20 SQ CM Modifier: Quantity: 1 CPT4 Code: Description: ICD-10 Diagnosis Description L98.492 Non-pressure chronic ulcer of skin of other sites with fat layer exposed Modifier: Quantity: CPT4 [id number] Description: 11045 - WC PHYS SUBQ TISS EA ADDL 20 CM Modifier: Quantity: 1 CPT4 Code: Description: ICD-10 Diagnosis Description L98.492 Non-pressure chronic ulcer of skin of other sites with fat layer  exposed Modifier: Quantity: Electronic Signature(s) Signed: 09/20/2021 6:57:41 PM By: Worthy Keeler PA-C Previous Signature: 09/19/2021 2:28:05 PM Version By: Donnamarie Poag Entered By: Worthy Keeler on 09/20/2021 18:57:41

## 2021-09-26 ENCOUNTER — Encounter: Payer: Medicaid Other | Admitting: Physician Assistant

## 2021-09-26 ENCOUNTER — Other Ambulatory Visit: Payer: Self-pay

## 2021-09-26 DIAGNOSIS — E11622 Type 2 diabetes mellitus with other skin ulcer: Secondary | ICD-10-CM | POA: Diagnosis not present

## 2021-09-26 NOTE — Progress Notes (Signed)
Richard Mendoza, Richard Mendoza ([id number]) Visit Report for 09/26/2021 Arrival Information Details Patient Name: Richard Mendoza, Richard Mendoza Date of Service: 09/26/2021 8:30 AM Medical Record Number: [id number] Patient Account Number: [id number] Date of Birth/Sex: [birth year] (45 y.o. M) Treating RN: Donnamarie Poag Primary Care Jacson Rapaport: Tracie Harrier Other Clinician: Referring Quentin Strebel: Tracie Harrier Treating Zarahi Fuerst/Extender: Skipper Cliche in Treatment: 1 Visit Information History Since Last Visit Added or deleted any medications: No Patient Arrived: Wheel Chair Had a fall or experienced change in No Arrival Time: 08:26 activities of daily living that may affect Accompanied By: self risk of falls: Transfer Assistance: EasyPivot Patient Lift Hospitalized since last visit: No Patient Identification Verified: Yes Has Dressing in Place as Prescribed: Yes Secondary Verification Process Completed: Yes Pain Present Now: Yes Patient Requires Transmission-Based No Precautions: Patient Has Alerts: Yes Patient Alerts: DIABETIC/insulin pump Left BKA AVVS ABI R 1.31 -7/22 Electronic Signature(s) Signed: 09/26/2021 1:01:38 PM By: Donnamarie Poag Entered By: Donnamarie Poag on 09/26/2021 08:31:02 Richard Mendoza ([id number]) -------------------------------------------------------------------------------- Clinic Level of Care Assessment Details Patient Name: Richard Mendoza Date of Service: 09/26/2021 8:30 AM Medical Record Number: [id number] Patient Account Number: [id number] Date of Birth/Sex: 1976/11/11 (45 y.o. M) Treating RN: Donnamarie Poag Primary Care Ngai Parcell: Tracie Harrier Other Clinician: Referring Ethyn Schetter: Tracie Harrier Treating Gayathri Futrell/Extender: Skipper Cliche in Treatment: 1 Clinic Level of Care Assessment Items TOOL 1 Quantity Score []  - Use when EandM and Procedure is performed on INITIAL visit 0 ASSESSMENTS - Nursing Assessment / Reassessment []  - General Physical Exam (combine w/  comprehensive assessment (listed just below) when performed on new 0 pt. evals) []  - 0 Comprehensive Assessment (HX, ROS, Risk Assessments, Wounds Hx, etc.) ASSESSMENTS - Wound and Skin Assessment / Reassessment []  - Dermatologic / Skin Assessment (not related to wound area) 0 ASSESSMENTS - Ostomy and/or Continence Assessment and Care []  - Incontinence Assessment and Management 0 []  - 0 Ostomy Care Assessment and Management (repouching, etc.) PROCESS - Coordination of Care []  - Simple Patient / Family Education for ongoing care 0 []  - 0 Complex (extensive) Patient / Family Education for ongoing care []  - 0 Staff obtains Programmer, systems, Records, Test Results / Process Orders []  - 0 Staff telephones HHA, Nursing Homes / Clarify orders / etc []  - 0 Routine Transfer to another Facility (non-emergent condition) []  - 0 Routine Hospital Admission (non-emergent condition) []  - 0 New Admissions / Biomedical engineer / Ordering NPWT, Apligraf, etc. []  - 0 Emergency Hospital Admission (emergent condition) PROCESS - Special Needs []  - Pediatric / Minor Patient Management 0 []  - 0 Isolation Patient Management []  - 0 Hearing / Language / Visual special needs []  - 0 Assessment of Community assistance (transportation, D/C planning, etc.) []  - 0 Additional assistance / Altered mentation []  - 0 Support Surface(s) Assessment (bed, cushion, seat, etc.) INTERVENTIONS - Miscellaneous []  - External ear exam 0 []  - 0 Patient Transfer (multiple staff / Civil Service fast streamer / Similar devices) []  - 0 Simple Staple / Suture removal (25 or less) []  - 0 Complex Staple / Suture removal (26 or more) []  - 0 Hypo/Hyperglycemic Management (do not check if billed separately) []  - 0 Ankle / Brachial Index (ABI) - do not check if billed separately Has the patient been seen at the hospital within the last three years: Yes Total Score: 0 Level Of Care: ____ Richard Mendoza ([id number]) Electronic  Signature(s) Signed: 09/26/2021 1:01:38 PM By: Donnamarie Poag Entered By: Donnamarie Poag on 09/26/2021 08:51:58 Richard Mendoza ([id number]) -------------------------------------------------------------------------------- Encounter Discharge Information Details Patient Name: Richard Mendoza  Date of Service: 09/26/2021 8:30 AM Medical Record Number: [id number] Patient Account Number: [id number] Date of Birth/Sex: 11-17-1976 (45 y.o. M) Treating RN: Donnamarie Poag Primary Care Jazminn Pomales: Tracie Harrier Other Clinician: Referring Dickie Cloe: Tracie Harrier Treating Leilan Bochenek/Extender: Skipper Cliche in Treatment: 1 Encounter Discharge Information Items Post Procedure Vitals Discharge Condition: Stable Temperature (F): 98 Ambulatory Status: Wheelchair Pulse (bpm): 87 Discharge Destination: Home Respiratory Rate (breaths/min): 16 Transportation: Private Auto Blood Pressure (mmHg): 125/75 Accompanied By: self Schedule Follow-up Appointment: Yes Clinical Summary of Care: Electronic Signature(s) Signed: 09/26/2021 1:01:38 PM By: Donnamarie Poag Entered By: Donnamarie Poag on 09/26/2021 09:05:15 Richard Mendoza ([id number]) -------------------------------------------------------------------------------- Lower Extremity Assessment Details Patient Name: Richard Mendoza Date of Service: 09/26/2021 8:30 AM Medical Record Number: [id number] Patient Account Number: [id number] Date of Birth/Sex: Aug 13, 1976 (45 y.o. M) Treating RN: Donnamarie Poag Primary Care Marbin Olshefski: Tracie Harrier Other Clinician: Referring Priscilla Finklea: Tracie Harrier Treating Joquan Lotz/Extender: Jeri Cos Weeks in Treatment: 1 Edema Assessment Assessed: [Left: Yes] [Right: No] Edema: [Left: N] [Right: o] Notes Left BKA Electronic Signature(s) Signed: 09/26/2021 1:01:38 PM By: Donnamarie Poag Entered By: Donnamarie Poag on 09/26/2021 08:38:58 Richard Mendoza, Richard Mendoza  ([id number]) -------------------------------------------------------------------------------- Multi Wound Chart Details Patient Name: Richard Mendoza Date of Service: 09/26/2021 8:30 AM Medical Record Number: [id number] Patient Account Number: [id number] Date of Birth/Sex: 04-12-1976 (45 y.o. M) Treating RN: Donnamarie Poag Primary Care Bobbe Quilter: Tracie Harrier Other Clinician: Referring Benney [place]: Tracie Harrier Treating Kirklin Mcduffee/Extender: Skipper Cliche in Treatment: 1 Vital Signs Height(in): 70 Pulse(bpm): 87 Weight(lbs): 215 Blood Pressure(mmHg): 125/75 Body Mass Index(BMI): 31 Temperature(F): 98 Respiratory Rate(breaths/min): 16 Photos: [N/A:N/A] Wound Location: Left, Medial Upper Leg N/A N/A Wounding Event: Blister N/A N/A Primary Etiology: Pressure Ulcer N/A N/A Secondary Etiology: Diabetic Wound/Ulcer of the Lower N/A N/A Extremity Comorbid History: Coronary Artery Disease, N/A N/A Hypertension, Type II Diabetes, Neuropathy Date Acquired: 08/19/2021 N/A N/A Weeks of Treatment: 1 N/A N/A Wound Status: Open N/A N/A Clustered Wound: Yes N/A N/A Measurements L x W x D (cm) 4.8x4.5x0.2 N/A N/A Area (cm) : 16.965 N/A N/A Volume (cm) : 3.393 N/A N/A % Reduction in Area: 39.60% N/A N/A % Reduction in Volume: 59.70% N/A N/A Classification: Category/Stage III N/A N/A Exudate Amount: Large N/A N/A Exudate Type: Serosanguineous N/A N/A Exudate Color: red, brown N/A N/A Granulation Amount: Medium (34-66%) N/A N/A Granulation Quality: Red, Pink N/A N/A Necrotic Amount: Medium (34-66%) N/A N/A Necrotic Tissue: Eschar, Adherent Slough N/A N/A Exposed Structures: Fat Layer (Subcutaneous Tissue): N/A N/A Yes Fascia: No Tendon: No Muscle: No Joint: No Bone: No Assessment Notes: wound measurement boxed N/A N/A Treatment Notes Electronic Signature(s) Signed: 09/26/2021 1:01:38 PM By: Donnamarie Poag Entered By: Donnamarie Poag on 09/26/2021 08:45:20 Richard Mendoza  ([id number]) Richard Mendoza, Richard Mendoza ([id number]) -------------------------------------------------------------------------------- Multi-Disciplinary Care Plan Details Patient Name: Richard Mendoza Date of Service: 09/26/2021 8:30 AM Medical Record Number: [id number] Patient Account Number: [id number] Date of Birth/Sex: 11/18/1976 (45 y.o. M) Treating RN: Donnamarie Poag Primary Care Liel Rudden: Tracie Harrier Other Clinician: Referring Yeraldy Spike: Tracie Harrier Treating Sheritha Louis/Extender: Skipper Cliche in Treatment: 1 Active Inactive Wound/Skin Impairment Nursing Diagnoses: Impaired tissue integrity Knowledge deficit related to smoking impact on wound healing Knowledge deficit related to ulceration/compromised skin integrity Goals: Patient/caregiver will verbalize understanding of skin care regimen Date Initiated: 09/19/2021 Date Inactivated: 09/26/2021 Target Resolution Date: 10/08/2021 Goal Status: Met Ulcer/skin breakdown will have a volume reduction of 30% by week 4 Date Initiated: 09/19/2021 Target Resolution Date: 10/17/2021 Goal Status: Active Ulcer/skin breakdown will have a volume reduction of 50% by week 8 Date  Initiated: 09/19/2021 Target Resolution Date: 11/14/2021 Goal Status: Active Ulcer/skin breakdown will have a volume reduction of 80% by week 12 Date Initiated: 09/19/2021 Target Resolution Date: 12/12/2021 Goal Status: Active Ulcer/skin breakdown will heal within 14 weeks Date Initiated: 09/19/2021 Target Resolution Date: 12/30/2021 Goal Status: Active Interventions: Assess patient/caregiver ability to obtain necessary supplies Assess patient/caregiver ability to perform ulcer/skin care regimen upon admission and as needed Assess ulceration(s) every visit Notes: Electronic Signature(s) Signed: 09/26/2021 1:01:38 PM By: Donnamarie Poag Entered By: Donnamarie Poag on 09/26/2021 08:39:20 Richard Mendoza  ([id number]) -------------------------------------------------------------------------------- Pain Assessment Details Patient Name: Richard Mendoza Date of Service: 09/26/2021 8:30 AM Medical Record Number: [id number] Patient Account Number: [id number] Date of Birth/Sex: May 10, 1976 (45 y.o. M) Treating RN: Donnamarie Poag Primary Care Cheryle Dark: Tracie Harrier Other Clinician: Referring Dewayne Jurek: Tracie Harrier Treating Odalis Jordan/Extender: Skipper Cliche in Treatment: 1 Active Problems Location of Pain Severity and Description of Pain Patient Has Paino Yes Site Locations Pain Location: Pain in Ulcers Rate the pain. Current Pain Level: 2 Pain Management and Medication Current Pain Management: Electronic Signature(s) Signed: 09/26/2021 1:01:38 PM By: Donnamarie Poag Entered By: Donnamarie Poag on 09/26/2021 08:35:33 Richard Mendoza ([id number]) -------------------------------------------------------------------------------- Patient/Caregiver Education Details Patient Name: Richard Mendoza Date of Service: 09/26/2021 8:30 AM Medical Record Number: [id number] Patient Account Number: [id number] Date of Birth/Gender: 1975/11/24 (45 y.o. M) Treating RN: Donnamarie Poag Primary Care Physician: Tracie Harrier Other Clinician: Referring Physician: Tracie Harrier Treating Physician/Extender: Skipper Cliche in Treatment: 1 Education Assessment Education Provided To: Patient Education Topics Provided Basic Hygiene: Pain: Pressure: Wound Debridement: Wound/Skin Impairment: Electronic Signature(s) Signed: 09/26/2021 1:01:38 PM By: Donnamarie Poag Entered By: Donnamarie Poag on 09/26/2021 08:52:49 Richard Mendoza, Richard Mendoza ([id number]) -------------------------------------------------------------------------------- Wound Assessment Details Patient Name: Richard Mendoza Date of Service: 09/26/2021 8:30 AM Medical Record Number: [id number] Patient Account Number: [id number] Date of Birth/Sex: 1976/10/11 (45  y.o. M) Treating RN: Donnamarie Poag Primary Care Cathy Ropp: Tracie Harrier Other Clinician: Referring Bernon Arviso: Tracie Harrier Treating Harl Wiechmann/Extender: Skipper Cliche in Treatment: 1 Wound Status Wound Number: 1 Primary Etiology: Pressure Ulcer Wound Location: Left, Medial Upper Leg Secondary Diabetic Wound/Ulcer of the Lower Extremity Etiology: Wounding Event: Blister Wound Status: Open Date Acquired: 08/19/2021 Notes: measurement is boxed Weeks Of Treatment: 1 Comorbid Coronary Artery Disease, Hypertension, Type II Clustered Wound: Yes History: Diabetes, Neuropathy Photos Wound Measurements Length: (cm) 4.8 % Redu Width: (cm) 4.5 % Redu Depth: (cm) 0.2 Tunnel Area: (cm) 16.965 Under Volume: (cm) 3.393 ction in Area: 39.6% ction in Volume: 59.7% ing: No mining: No Wound Description Classification: Category/Stage III Foul O Exudate Amount: Large Slough Exudate Type: Serosanguineous Exudate Color: red, brown dor After Cleansing: No /Fibrino Yes Wound Bed Granulation Amount: Medium (34-66%) Exposed Structure Granulation Quality: Red, Pink Fascia Exposed: No Necrotic Amount: Medium (34-66%) Fat Layer (Subcutaneous Tissue) Exposed: Yes Necrotic Quality: Eschar, Adherent Slough Tendon Exposed: No Muscle Exposed: No Joint Exposed: No Bone Exposed: No Assessment Notes wound measurement boxed Treatment Notes Wound #1 (Upper Leg) Wound Laterality: Left, Medial Cleanser Richard Mendoza, Richard Mendoza ([id number]) Peri-Wound Care Topical Primary Dressing Hydrofera Blue Ready Transfer Foam, 4x5 (in/in) Discharge Instruction: Apply Hydrofera Blue Ready to wound bed as directed Secondary Dressing ABD Pad 5x9 (in/in) Discharge Instruction: Cover with ABD pad Foam Dressing, 4x4 (in/in) Discharge Instruction: on top to pad around wound Secured With 50M Medipore H Soft Cloth Surgical Tape, 2x2 (in/yd) Kerlix Roll Sterile or Non-Sterile 6-ply 4.5x4 (yd/yd) Discharge  Instruction: Apply Kerlix as directed Compression Wrap Compression Stockings Add-Ons Electronic Signature(s) Signed: 09/26/2021 1:01:38 PM By:  Bishop, Joy Entered By: Donnamarie Poag on 09/26/2021 08:37:57 Richard Mendoza ([id number]) -------------------------------------------------------------------------------- [place] Details Patient Name: Richard Mendoza Date of Service: 09/26/2021 8:30 AM Medical Record Number: [id number] Patient Account Number: [id number] Date of Birth/Sex: 1976-07-21 (45 y.o. M) Treating RN: Donnamarie Poag Primary Care Jamelah Sitzer: Tracie Harrier Other Clinician: Referring Cacey Willow: Tracie Harrier Treating Karmah Potocki/Extender: Skipper Cliche in Treatment: 1 Vital Signs Time Taken: 08:30 Temperature (F): 98 Height (in): 70 Pulse (bpm): 87 Weight (lbs): 215 Respiratory Rate (breaths/min): 16 Body Mass Index (BMI): 30.8 Blood Pressure (mmHg): 125/75 Reference Range: 80 - 120 mg / dl Electronic Signature(s) Signed: 09/26/2021 1:01:38 PM By: Donnamarie Poag Entered ByDonnamarie Poag on 09/26/2021 08:36:21

## 2021-09-26 NOTE — Progress Notes (Addendum)
JANEL, PONTIFF ([id number]) Visit Report for 09/26/2021 Chief Complaint Document Details Patient Name: Richard Mendoza, Richard Mendoza Date of Service: 09/26/2021 8:30 AM Medical Record Number: [id number] Patient Account Number: [id number] Date of Birth/Sex: Jun 19, 1976 (45 y.o. M) Treating RN: Donnamarie Poag Primary Care Provider: Tracie Harrier Other Clinician: Referring Provider: Tracie Harrier Treating Provider/Extender: Skipper Cliche in Treatment: 1 Information Obtained from: Patient Chief Complaint Left LE Ulcers due to prosthesis pressure and rubbing Electronic Signature(s) Signed: 09/26/2021 8:44:22 AM By: Worthy Keeler PA-C Entered By: Worthy Keeler on 09/26/2021 08:44:21 Richard Mendoza, Richard Mendoza ([id number]) -------------------------------------------------------------------------------- Debridement Details Patient Name: Richard Mendoza Date of Service: 09/26/2021 8:30 AM Medical Record Number: [id number] Patient Account Number: [id number] Date of Birth/Sex: 03-24-1976 (45 y.o. M) Treating RN: Donnamarie Poag Primary Care Provider: Tracie Harrier Other Clinician: Referring Provider: Tracie Harrier Treating Provider/Extender: Skipper Cliche in Treatment: 1 Debridement Performed for Wound #1 Left,Medial Upper Leg Assessment: Performed By: Physician Tommie Sams., PA-C Debridement Type: Debridement Severity of Tissue Pre Debridement: Fat layer exposed Level of Consciousness (Pre- Awake and Alert procedure): Pre-procedure Verification/Time Out Yes - 08:45 Taken: Start Time: 08:45 Pain Control: Lidocaine Total Area Debrided (L x W): 2.5 (cm) x 2.5 (cm) = 6.25 (cm) Tissue and other material Non-Viable, Eschar, Slough, Subcutaneous, Slough debrided: Level: Skin/Subcutaneous Tissue Debridement Description: Excisional Instrument: Forceps, Scissors Bleeding: Moderate Hemostasis Achieved: Pressure Response to Treatment: Procedure was tolerated well Level of Consciousness  (Post- Awake and Alert procedure): Post Debridement Measurements of Total Wound Length: (cm) 4.8 Stage: Category/Stage III Width: (cm) 4.5 Depth: (cm) 0.2 Volume: (cm) 3.393 Character of Wound/Ulcer Post Debridement: Improved Severity of Tissue Post Debridement: Fat layer exposed Post Procedure Diagnosis Same as Pre-procedure Electronic Signature(s) Signed: 09/26/2021 1:01:38 PM By: Donnamarie Poag Signed: 09/26/2021 6:00:47 PM By: Worthy Keeler PA-C Entered By: Donnamarie Poag on 09/26/2021 08:50:35 Richard Mendoza ([id number]) -------------------------------------------------------------------------------- HPI Details Patient Name: Richard Mendoza Date of Service: 09/26/2021 8:30 AM Medical Record Number: [id number] Patient Account Number: [id number] Date of Birth/Sex: 1976/07/29 (45 y.o. M) Treating RN: Donnamarie Poag Primary Care Provider: Tracie Harrier Other Clinician: Referring Provider: Tracie Harrier Treating Provider/Extender: Skipper Cliche in Treatment: 1 History of Present Illness HPI Description: 09/19/2021 upon evaluation today patient presents for initial inspection here in our clinic concerning issues he has been having with wounds over the medial and lateral portion of his knee that were caused by pressure from his prosthesis. Unfortunately this had shrunk over time as most do and he was just before getting a new size or rather it resized when this started causing the problem in September he had first got the prosthesis in April. Nonetheless he has not been able to wear the prosthesis now on this BKA due to the fact that again he has the wounds. 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 He does have hypertension. In general the good news is this does not appear to be infected. 09/26/2021 upon evaluation today patient appears to be doing much better in regard to his wounds. He is going  to have to have some sharp debridement today but overall I am extremely pleased with where things stand at this point. It does not appear to be any evidence of active infection which is great news. Electronic Signature(s) Signed: 09/26/2021 5:10:49 PM By: Worthy Keeler PA-C Entered By: Worthy Keeler on 09/26/2021 17:10:48 Richard Mendoza, Richard Mendoza ([id number]) -------------------------------------------------------------------------------- Physical Exam Details Patient Name:  Richard Mendoza Date of Service: 09/26/2021 8:30 AM Medical Record Number: [id number] Patient Account Number: [id number] Date of Birth/Sex: 08-Mar-1976 (45 y.o. M) Treating RN: Donnamarie Poag Primary Care Provider: Tracie Harrier Other Clinician: Referring Provider: Tracie Harrier Treating Provider/Extender: Skipper Cliche in Treatment: 1 Constitutional Well-nourished and well-hydrated in no acute distress. Respiratory normal breathing without difficulty. Psychiatric this patient is able to make decisions and demonstrates good insight into disease process. Alert and Oriented x 3. pleasant and cooperative. Notes Patient's wound bed showed signs of good granulation and epithelization at this point. Fortunately again there is no signs of infection and again sharp debridement I was able to remove some of the necrotic debris including eschar as well as slough and subcutaneous tissue which again postdebridement everything appears to be doing really well. I am hopeful that this will heal significantly between now and next week even. Electronic Signature(s) Signed: 09/26/2021 5:11:15 PM By: Worthy Keeler PA-C Entered By: Worthy Keeler on 09/26/2021 17:11:15 Richard Mendoza ([id number]) -------------------------------------------------------------------------------- Physician Orders Details Patient Name: Richard Mendoza Date of Service: 09/26/2021 8:30 AM Medical Record Number: [id number] Patient Account Number:  [id number] Date of Birth/Sex: 11-15-1976 (45 y.o. M) Treating RN: Donnamarie Poag Primary Care Provider: Tracie Harrier Other Clinician: Referring Provider: Tracie Harrier Treating Provider/Extender: Skipper Cliche in Treatment: 1 Verbal / Phone Orders: No Diagnosis Coding ICD-10 Coding Code Description E11.622 Type 2 diabetes mellitus with other skin ulcer L98.492 Non-pressure chronic ulcer of skin of other sites with fat layer exposed Z89.512 Acquired absence of left leg below knee I10 Essential (primary) hypertension Follow-up Appointments o Return Appointment in 1 week. o Nurse Visit as needed Bathing/ Shower/ Hygiene o Clean wound with Normal Saline or wound cleanser. - keep dressing dry o No tub bath. Edema Control - Lymphedema / Segmental Compressive Device / Other o Tubigrip single layer applied. - left leg o DO YOUR BEST to sleep in the bed at night. DO NOT sleep in your recliner. Long hours of sitting in a recliner leads to swelling of the legs and/or potential wounds on your backside. Additional Orders / Instructions o Follow Nutritious Diet and Increase Protein Intake o Other: - continue to check blood sugar Medications-Please add to medication list. o Take one 500mg  Tylenol (Acetaminophen) and one 200mg  Motrin (Ibuprofen) every 6 hours for pain. Do not take ibuprofen if you are on blood thinners or have stomach ulcers. Wound Treatment Wound #1 - Upper Leg Wound Laterality: Left, Medial Primary Dressing: Hydrofera Blue Ready Transfer Foam, 4x5 (in/in) 2 x Per Week/15 Days Discharge Instructions: Apply Hydrofera Blue Ready to wound bed as directed Secondary Dressing: ABD Pad 5x9 (in/in) 2 x Per Week/15 Days Discharge Instructions: Cover with ABD pad Secondary Dressing: Foam Dressing, 4x4 (in/in) 2 x Per Week/15 Days Discharge Instructions: on top to pad around wound Secured With: 53M Medipore H Soft Cloth Surgical Tape, 2x2 (in/yd) 2 x Per Week/15  Days Secured With: [employer] Sterile or Non-Sterile 6-ply 4.5x4 (yd/yd) 2 x Per Week/15 Days Discharge Instructions: Apply Kerlix as directed Electronic Signature(s) Signed: 09/26/2021 1:01:38 PM By: Donnamarie Poag Signed: 09/26/2021 6:00:47 PM By: Worthy Keeler PA-C Entered By: Donnamarie Poag on 09/26/2021 08:51:51 Richard Mendoza, Richard Mendoza ([id number]) -------------------------------------------------------------------------------- Problem List Details Patient Name: Richard Mendoza Date of Service: 09/26/2021 8:30 AM Medical Record Number: [id number] Patient Account Number: [id number] Date of Birth/Sex: 1976/01/16 (45 y.o. M) Treating RN: Donnamarie Poag Primary Care Provider: Tracie Harrier Other Clinician: Referring Provider: Tracie Harrier Treating Provider/Extender: Skipper Cliche  in Treatment: 1 Active Problems ICD-10 Encounter Code Description Active Date MDM Diagnosis E11.622 Type 2 diabetes mellitus with other skin ulcer 09/19/2021 No Yes L98.492 Non-pressure chronic ulcer of skin of other sites with fat layer exposed 09/19/2021 No Yes Z89.512 Acquired absence of left leg below knee 09/19/2021 No Yes I10 Essential (primary) hypertension 09/19/2021 No Yes Inactive Problems Resolved Problems Electronic Signature(s) Signed: 09/26/2021 8:44:15 AM By: Lenda Kelp PA-C Entered By: Lenda Kelp on 09/26/2021 08:44:15 Richard Mendoza, Richard Needle ([id number]) -------------------------------------------------------------------------------- Progress Note Details Patient Name: Richard Mendoza Date of Service: 09/26/2021 8:30 AM Medical Record Number: [id number] Patient Account Number: [id number] Date of Birth/Sex: [birth year] (45 y.o. M) Treating RN: Hansel Feinstein Primary Care Provider: Barbette Reichmann Other Clinician: Referring Provider: Barbette Reichmann Treating Provider/Extender: Rowan Blase in Treatment: 1 Subjective Chief Complaint Information obtained from Patient Left LE Ulcers due to  prosthesis pressure and rubbing History of Present Illness (HPI) 09/19/2021 upon evaluation today patient presents for initial inspection here in our clinic concerning issues he has been having with wounds over the medial and lateral portion of his knee that were caused by pressure from his prosthesis. Unfortunately this had shrunk over time as most do and he was just before getting a new size or rather it resized when this started causing the problem in September he had first got the prosthesis in April. Nonetheless he has not been able to wear the prosthesis now on this BKA due to the fact that again he has the wounds. 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 He does have hypertension. In general the good news is this does not appear to be infected. 09/26/2021 upon evaluation today patient appears to be doing much better in regard to his wounds. He is going to have to have some sharp debridement today but overall I am extremely pleased with where things stand at this point. It does not appear to be any evidence of active infection which is great news. Objective Constitutional Well-nourished and well-hydrated in no acute distress. Vitals Time Taken: 8:30 AM, Height: 70 in, Weight: 215 lbs, BMI: 30.8, Temperature: 98 F, Pulse: 87 bpm, Respiratory Rate: 16 breaths/min, Blood Pressure: 125/75 mmHg. Respiratory normal breathing without difficulty. Psychiatric this patient is able to make decisions and demonstrates good insight into disease process. Alert and Oriented x 3. pleasant and cooperative. General Notes: Patient's wound bed showed signs of good granulation and epithelization at this point. Fortunately again there is no signs of infection and again sharp debridement I was able to remove some of the necrotic debris including eschar as well as slough and subcutaneous tissue which again postdebridement  everything appears to be doing really well. I am hopeful that this will heal significantly between now and next week even. Integumentary (Hair, Skin) Wound #1 status is Open. Original cause of wound was Blister. The date acquired was: 08/19/2021. The wound has been in treatment 1 weeks. The wound is located on the Left,Medial Upper Leg. The wound measures 4.8cm length x 4.5cm width x 0.2cm depth; 16.965cm^2 area and 3.393cm^3 volume. There is Fat Layer (Subcutaneous Tissue) exposed. There is no tunneling or undermining noted. There is a large amount of serosanguineous drainage noted. There is medium (34-66%) red, pink granulation within the wound bed. There is a medium (34-66%) amount of necrotic tissue within the wound bed including Eschar and Adherent Slough. 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 There was a Excisional Skin/Subcutaneous Tissue Debridement with a total area of 6.25 sq cm performed by Tommie Sams., PA-C. With the following instrument(s): Forceps, and Scissors to remove Non-Viable tissue/material. Material removed includes Eschar, Subcutaneous Tissue, and Slough after achieving pain control using Lidocaine. A time out was conducted at 08:45, prior to the start of the procedure. A Moderate amount of bleeding was controlled with Pressure. The procedure was tolerated well. Post Debridement Measurements: 4.8cm length x 4.5cm width x 0.2cm depth; 3.393cm^3 volume. Post debridement Stage noted as Category/Stage III. Character of Wound/Ulcer Post Debridement is  improved. Severity of Tissue Post Debridement is: Fat layer exposed. Post procedure Diagnosis Wound #1: Same as Pre-Procedure Plan Follow-up Appointments: Return Appointment in 1 week. Nurse Visit as needed Bathing/ Shower/ Hygiene: Clean wound with Normal Saline or wound cleanser. - keep dressing dry No tub bath. Edema Control - Lymphedema / Segmental Compressive Device / Other: Tubigrip single layer applied. - left leg DO YOUR BEST to sleep in the bed at night. DO NOT sleep in your recliner. Long hours of sitting in a recliner leads to swelling of the legs and/or potential wounds on your backside. Additional Orders / Instructions: Follow Nutritious Diet and Increase Protein Intake Other: - continue to check blood sugar Medications-Please add to medication list.: Take one 500mg  Tylenol (Acetaminophen) and one 200mg  Motrin (Ibuprofen) every 6 hours for pain. Do not take ibuprofen if you are on blood thinners or have stomach ulcers. WOUND #1: - Upper Leg Wound Laterality: Left, Medial Primary Dressing: Hydrofera Blue Ready Transfer Foam, 4x5 (in/in) 2 x Per Week/15 Days Discharge Instructions: Apply Hydrofera Blue Ready to wound bed as directed Secondary Dressing: ABD Pad 5x9 (in/in) 2 x Per Week/15 Days Discharge Instructions: Cover with ABD pad Secondary Dressing: Foam Dressing, 4x4 (in/in) 2 x Per Week/15 Days Discharge Instructions: on top to pad around wound Secured With: 67M Medipore H Soft Cloth Surgical Tape, 2x2 (in/yd) 2 x Per Week/15 Days Secured With: [employer] Sterile or Non-Sterile 6-ply 4.5x4 (yd/yd) 2 x Per Week/15 Days Discharge Instructions: Apply Kerlix as directed 1. Would recommend currently that we going continue with wound care measures as before and the patient is in agreement the plan this includes the use of the [facility] which I think is probably can to be the best option. 2. I am also can recommend that we have the patient continue with the ABD pad to  cover followed by roll gauze. We will see patient back for reevaluation in 1 week here in the clinic. If anything worsens or changes patient will contact our office for additional recommendations. Electronic Signature(s) Signed: 09/26/2021 5:11:34 PM By: Worthy Keeler PA-C Entered By: Worthy Keeler on 09/26/2021 17:11:34 Richard Mendoza ([id number]) -------------------------------------------------------------------------------- SuperBill Details Patient Name: Richard Mendoza Date of Service: 09/26/2021 Medical Record Number: [id number] Patient Account Number: [id number] Date of Birth/Sex: 25-Sep-1976 (45 y.o. M) Treating RN: Donnamarie Poag Primary Care Provider: Tracie Harrier Other Clinician: Referring Provider: Tracie Harrier Treating Provider/Extender: Skipper Cliche in Treatment: 1 Diagnosis Coding ICD-10 Codes Code Description E11.622 Type 2 diabetes mellitus with other skin ulcer  [id number] Non-pressure chronic ulcer of skin of other sites with fat layer exposed Z89.512 Acquired absence of left leg below knee I10 Essential (primary) hypertension Facility Procedures CPT4 Code: [id number] Description: [id number] - DEB SUBQ TISSUE 20 SQ CM/< Modifier: Quantity: 1 CPT4 Code: Description: ICD-10 Diagnosis Description L98.492 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

## 2021-10-03 ENCOUNTER — Encounter: Payer: Medicaid Other | Admitting: Physician Assistant

## 2021-10-03 ENCOUNTER — Other Ambulatory Visit: Payer: Self-pay

## 2021-10-03 DIAGNOSIS — E11622 Type 2 diabetes mellitus with other skin ulcer: Secondary | ICD-10-CM | POA: Diagnosis not present

## 2021-10-03 NOTE — Progress Notes (Signed)
Richard Mendoza, Richard Mendoza ([id number]) Visit Report for 10/03/2021 Arrival Information Details Patient Name: Richard Mendoza, Richard Mendoza Date of Service: 10/03/2021 8:30 AM Medical Record Number: [id number] Patient Account Number: [id number] Date of Birth/Sex: 1976-05-03 (45 y.o. M) Treating RN: Donnamarie Poag Primary Care Kimoni Pagliarulo: Tracie Harrier Other Clinician: Referring Amorah Sebring: Tracie Harrier Treating Saagar Tortorella/Extender: Skipper Cliche in Treatment: 2 Visit Information History Since Last Visit Added or deleted any medications: No Patient Arrived: Wheel Chair Had a fall or experienced change in No Arrival Time: 08:32 activities of daily living that may affect Accompanied By: wife risk of falls: Transfer Assistance: EasyPivot Patient Lift Hospitalized since last visit: No Patient Identification Verified: Yes Has Dressing in Place as Prescribed: Yes Secondary Verification Process Completed: Yes Pain Present Now: Yes Patient Requires Transmission-Based No Precautions: Patient Has Alerts: Yes Patient Alerts: DIABETIC/insulin pump 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 M) Treating RN: Donnamarie Poag Primary Care Abigal Choung: Tracie Harrier Other Clinician: Referring Drema Eddington: Tracie Harrier Treating Lamiah Marmol/Extender: Skipper Cliche in Treatment: 2 Clinic Level of Care Assessment Items TOOL 1 Quantity Score []  - Use when EandM and Procedure is performed on INITIAL visit 0 ASSESSMENTS - Nursing Assessment / Reassessment []  - General Physical Exam (combine w/  comprehensive assessment (listed just below) when performed on new 0 pt. evals) []  - 0 Comprehensive Assessment (HX, ROS, Risk Assessments, Wounds Hx, etc.) ASSESSMENTS - Wound and Skin Assessment / Reassessment []  - Dermatologic / Skin Assessment (not related to wound area) 0 ASSESSMENTS - Ostomy and/or Continence Assessment and Care []  - Incontinence Assessment and Management 0 []  - 0 Ostomy Care Assessment and Management (repouching, etc.) PROCESS - Coordination of Care []  - Simple Patient / Family Education for ongoing care 0 []  - 0 Complex (extensive) Patient / Family Education for ongoing care []  - 0 Staff obtains Programmer, systems, Records, Test Results / Process Orders []  - 0 Staff telephones HHA, Nursing Homes / Clarify orders / etc []  - 0 Routine Transfer to another Facility (non-emergent condition) []  - 0 Routine Hospital Admission (non-emergent condition) []  - 0 New Admissions / Biomedical engineer / Ordering NPWT, Apligraf, etc. []  - 0 Emergency Hospital Admission (emergent condition) PROCESS - Special Needs []  - Pediatric / Minor Patient Management 0 []  - 0 Isolation Patient Management []  - 0 Hearing / Language / Visual special needs []  - 0 Assessment of Community assistance (transportation, D/C planning, etc.) []  - 0 Additional assistance / Altered mentation []  - 0 Support Surface(s) Assessment (bed, cushion, seat, etc.) INTERVENTIONS - Miscellaneous []  - External ear exam 0 []  - 0 Patient Transfer (multiple staff / Civil Service fast streamer / Similar devices) []  - 0 Simple Staple / Suture removal (25 or less) []  - 0 Complex Staple / Suture removal (26 or more) []  - 0 Hypo/Hyperglycemic Management (do not check if billed separately) []  - 0 Ankle / Brachial Index (ABI) - do not check if billed separately Has the patient been seen at the hospital within the last three years: Yes Total Score: 0 Level Of Care: ____ Richard Mendoza ([id number]) Electronic  Signature(s) Signed: 10/03/2021 3:16:58 PM By: Donnamarie Poag Entered By: Donnamarie Poag on 10/03/2021 09:17:24 Richard Mendoza ([id number]) -------------------------------------------------------------------------------- Encounter Discharge Information Details Patient Name: Richard Mendoza  Date of Service: 10/03/2021 8:30 AM Medical Record Number: [id number] Patient Account Number: [id number] Date of Birth/Sex: 1975/12/11 (45 y.o. M) Treating RN: Donnamarie Poag Primary Care Dagon Budai: Tracie Harrier Other Clinician: Referring Tanish Sinkler: Tracie Harrier Treating Jimi Schappert/Extender: Skipper Cliche in Treatment: 2 Encounter Discharge Information Items Post Procedure Vitals Discharge Condition: Stable Temperature (F): 97.9 Ambulatory Status: Wheelchair Pulse (bpm): 82 Discharge Destination: Home Respiratory Rate (breaths/min): 16 Transportation: Private Auto Blood Pressure (mmHg): 175/114 Accompanied By: wife Schedule Follow-up Appointment: Yes Clinical Summary of Care: Electronic Signature(s) Signed: 10/03/2021 3:16:58 PM By: Donnamarie Poag Entered By: Donnamarie Poag on 10/03/2021 09:17:10 Richard Mendoza ([id number]) -------------------------------------------------------------------------------- Lower Extremity Assessment Details Patient Name: Richard Mendoza Date of Service: 10/03/2021 8:30 AM Medical Record Number: [id number] Patient Account Number: [id number] Date of Birth/Sex: [birth year] (45 y.o. M) Treating RN: Donnamarie Poag Primary Care Consuelo Suthers: Tracie Harrier Other Clinician: Referring Ajna Moors: Tracie Harrier Treating Blaire Hodsdon/Extender: Jeri Cos Weeks in Treatment: 2 Edema Assessment Assessed: [Left: Yes] [Right: No] Edema: [Left: N] [Right: o] Electronic Signature(s) Signed: 10/03/2021 3:16:58 PM By: Donnamarie Poag Entered By: Donnamarie Poag on 10/03/2021 08:41:55 Sida, Legrand Como  ([id number]) -------------------------------------------------------------------------------- Multi Wound Chart Details Patient Name: Richard Mendoza Date of Service: 10/03/2021 8:30 AM Medical Record Number: [id number] Patient Account Number: [id number] Date of Birth/Sex: Aug 08, 1976 (45 y.o. M) Treating RN: Donnamarie Poag Primary Care Prentiss Hammett: Tracie Harrier Other Clinician: Referring Delan Ksiazek: Tracie Harrier Treating Athleen Feltner/Extender: Skipper Cliche in Treatment: 2 Vital Signs Height(in): 70 Pulse(bpm): 82 Weight(lbs): 215 Blood Pressure(mmHg): 175/114 Body Mass Index(BMI): 31 Temperature(F): 97.9 Respiratory Rate(breaths/min): 16 Photos: [N/A:N/A] Wound Location: Left, Medial Upper Leg N/A N/A Wounding Event: Blister N/A N/A Primary Etiology: Pressure Ulcer N/A N/A Secondary Etiology: Diabetic Wound/Ulcer of the Lower N/A N/A Extremity Comorbid History: Coronary Artery Disease, N/A N/A Hypertension, Type II Diabetes, Neuropathy Date Acquired: 08/19/2021 N/A N/A Weeks of Treatment: 2 N/A N/A Wound Status: Open N/A N/A Clustered Wound: Yes N/A N/A Measurements L x W x D (cm) 4.5x4.2x0.1 N/A N/A Area (cm) : 14.844 N/A N/A Volume (cm) : 1.484 N/A N/A % Reduction in Area: 47.10% N/A N/A % Reduction in Volume: 82.40% N/A N/A Classification: Category/Stage III N/A N/A Exudate Amount: Large N/A N/A Exudate Type: Serosanguineous N/A N/A Exudate Color: red, brown N/A N/A Granulation Amount: Medium (34-66%) N/A N/A Granulation Quality: Red, Pink N/A N/A Necrotic Amount: Medium (34-66%) N/A N/A Necrotic Tissue: Eschar, Adherent Slough N/A N/A Exposed Structures: Fat Layer (Subcutaneous Tissue): N/A N/A Yes Fascia: No Tendon: No Muscle: No Joint: No Bone: No Treatment Notes Electronic Signature(s) Signed: 10/03/2021 3:16:58 PM By: Donnamarie Poag Entered By: Donnamarie Poag on 10/03/2021 08:43:41 Birdwell, Legrand Como ([id number]) Joan Flores, Legrand Como  ([id number]) -------------------------------------------------------------------------------- Multi-Disciplinary Care Plan Details Patient Name: Richard Mendoza Date of Service: 10/03/2021 8:30 AM Medical Record Number: [id number] Patient Account Number: [id number] Date of Birth/Sex: 22-Jan-1976 (45 y.o. M) Treating RN: Donnamarie Poag Primary Care Brailey Buescher: Tracie Harrier Other Clinician: Referring Zaevion Parke: Tracie Harrier Treating Montrelle Eddings/Extender: Skipper Cliche in Treatment: 2 Active Inactive Wound/Skin Impairment Nursing Diagnoses: Impaired tissue integrity Knowledge deficit related to smoking impact on wound healing Knowledge deficit related to ulceration/compromised skin integrity Goals: Patient/caregiver will verbalize understanding of skin care regimen Date Initiated: 09/19/2021 Date Inactivated: 09/26/2021 Target Resolution Date: 10/08/2021 Goal Status: Met Ulcer/skin breakdown will have a volume reduction of 30% by week 4 Date Initiated: 09/19/2021 Target Resolution Date: 10/17/2021 Goal Status: Active Ulcer/skin breakdown will have a volume reduction of 50% by week 8 Date Initiated: 09/19/2021 Target Resolution Date: 11/14/2021 Goal Status: Active Ulcer/skin  breakdown will have a volume reduction of 80% by week 12 Date Initiated: 09/19/2021 Target Resolution Date: 12/12/2021 Goal Status: Active Ulcer/skin breakdown will heal within 14 weeks Date Initiated: 09/19/2021 Target Resolution Date: 12/30/2021 Goal Status: Active Interventions: Assess patient/caregiver ability to obtain necessary supplies Assess patient/caregiver ability to perform ulcer/skin care regimen upon admission and as needed Assess ulceration(s) every visit Notes: Electronic Signature(s) Signed: 10/03/2021 3:16:58 PM By: Donnamarie Poag Entered By: Donnamarie Poag on 10/03/2021 08:42:52 Sangiovanni, Legrand Como ([id number]) -------------------------------------------------------------------------------- Pain  Assessment Details Patient Name: Richard Mendoza Date of Service: 10/03/2021 8:30 AM Medical Record Number: [id number] Patient Account Number: [id number] Date of Birth/Sex: 13-Apr-1976 (45 y.o. M) Treating RN: Donnamarie Poag Primary Care Kadir Azucena: Tracie Harrier Other Clinician: Referring Noreen Mackintosh: Tracie Harrier Treating Arbor Leer/Extender: Skipper Cliche in Treatment: 2 Active Problems Location of Pain Severity and Description of Pain Patient Has Paino Yes Site Locations Rate the pain. Current Pain Level: 2 Pain Management and Medication Current Pain Management: Electronic Signature(s) Signed: 10/03/2021 3:16:58 PM By: Donnamarie Poag Entered By: Donnamarie Poag on 10/03/2021 08:39:37 Richard Mendoza ([id number]) -------------------------------------------------------------------------------- Patient/Caregiver Education Details Patient Name: Richard Mendoza Date of Service: 10/03/2021 8:30 AM Medical Record Number: [id number] Patient Account Number: [id number] Date of Birth/Gender: 1976/04/29 (45 y.o. M) Treating RN: Donnamarie Poag Primary Care Physician: Tracie Harrier Other Clinician: Referring Physician: Tracie Harrier Treating Physician/Extender: Skipper Cliche in Treatment: 2 Education Assessment Education Provided To: Patient Education Topics Provided Basic Hygiene: Elevated Blood Sugar/ Impact on Healing: Infection: Nutrition: Wound Debridement: Wound/Skin Impairment: Notes Education on BP and med compliance Electronic Signature(s) Signed: 10/03/2021 3:16:58 PM By: Donnamarie Poag Entered By: Donnamarie Poag on 10/03/2021 09:18:12 Richard Mendoza ([id number]) -------------------------------------------------------------------------------- Wound Assessment Details Patient Name: Richard Mendoza Date of Service: 10/03/2021 8:30 AM Medical Record Number: [id number] Patient Account Number: [id number] Date of Birth/Sex: 1975-11-28 (45 y.o. M) Treating RN: Donnamarie Poag Primary Care Wesam Gearhart: Tracie Harrier Other Clinician: Referring Elta Angell: Tracie Harrier Treating Zyhir Cappella/Extender: Skipper Cliche in Treatment: 2 Wound Status Wound Number: 1 Primary Etiology: Pressure Ulcer Wound Location: Left, Medial Upper Leg Secondary Diabetic Wound/Ulcer of the Lower Extremity Etiology: Wounding Event: Blister Wound Status: Open Date Acquired: 08/19/2021 Notes: measurement is boxed Weeks Of Treatment: 2 Comorbid Coronary Artery Disease, Hypertension, Type II Clustered Wound: Yes History: Diabetes, Neuropathy Photos Wound Measurements Length: (cm) 4.5 % Red Width: (cm) 4.2 % Red Depth: (cm) 0.1 [place] Area: (cm) 14.844 Unde Volume: (cm) 1.484 St End Max uction in Area: 47.1% uction in Volume: 82.4% ling: No rmining: Yes arting Position (o'clock): 1 ing Position (o'clock): 3 imum Distance: (cm) 0.9 Wound Description Classification: Category/Stage III Foul Exudate Amount: Large Sloug Exudate Type: Serosanguineous Exudate Color: red, brown Odor After Cleansing: No h/Fibrino Yes Wound Bed Granulation Amount: Medium (34-66%) Exposed Structure Granulation Quality: Red, Pink Fascia Exposed: No Necrotic Amount: Medium (34-66%) Fat Layer (Subcutaneous Tissue) Exposed: Yes Necrotic Quality: Eschar, Adherent Slough Tendon Exposed: No Muscle Exposed: No Joint Exposed: No Bone Exposed: No Treatment Notes Wound #1 (Upper Leg) Wound Laterality: Left, Medial Cleanser Goh, Narek ([id number]) Peri-Wound Care Topical Primary Dressing Hydrofera Blue Ready Transfer Foam, 4x5 (in/in) Discharge Instruction: Apply Hydrofera Blue Ready to wound bed as directed Secondary Dressing ABD Pad 5x9 (in/in) Discharge Instruction: Cover with ABD pad Foam Dressing, 4x4 (in/in) Discharge Instruction: on top to pad around wound Secured With 70M Medipore H Soft Cloth Surgical Tape, 2x2 (in/yd) Kerlix Roll Sterile or Non-Sterile 6-ply 4.5x4  (yd/yd) Discharge Instruction: Apply Kerlix as directed Compression Wrap Compression Stockings Add-Ons  Electronic Signature(s) Signed: 10/03/2021 3:16:58 PM By: Donnamarie Poag Entered By: Donnamarie Poag on 10/03/2021 09:11:23 Richard Mendoza ([id number]) -------------------------------------------------------------------------------- [place] Details Patient Name: Richard Mendoza Date of Service: 10/03/2021 8:30 AM Medical Record Number: [id number] Patient Account Number: [id number] Date of Birth/Sex: 1976-02-18 (45 y.o. M) Treating RN: Donnamarie Poag Primary Care Sher Hellinger: Tracie Harrier Other Clinician: Referring Jezel Basto: Tracie Harrier Treating Jakyla Reza/Extender: Skipper Cliche in Treatment: 2 Vital Signs Time Taken: 08:33 Temperature (F): 97.9 Height (in): 70 Pulse (bpm): 82 Weight (lbs): 215 Respiratory Rate (breaths/min): 16 Body Mass Index (BMI): 30.8 Blood Pressure (mmHg): 175/114 Reference Range: 80 - 120 mg / dl Notes denies headache, stated he did not take medication this morning and will monitor BP at home and con't to take meds and will take prior to appt and first thing in morning/education and PA notified Electronic Signature(s) Signed: 10/03/2021 3:16:58 PM By: Donnamarie Poag Entered ByDonnamarie Poag on 10/03/2021 08:39:30

## 2021-10-03 NOTE — Progress Notes (Signed)
Richard Mendoza, Richard Mendoza ([id number]) Visit Report for 10/03/2021 Chief Complaint Document Details Patient Name: Richard Mendoza, Richard Mendoza Date of Service: 10/03/2021 8:30 AM Medical Record Number: [id number] Patient Account Number: [id number] Date of Birth/Sex: 02/15/1976 (45 y.o. M) Treating RN: Hansel Feinstein Primary Care Provider: Barbette Reichmann Other Clinician: Referring Provider: Barbette Reichmann Treating Provider/Extender: Rowan Blase in Treatment: 2 Information Obtained from: Patient Chief Complaint Left LE Ulcers due to prosthesis pressure and rubbing Electronic Signature(s) Signed: 10/03/2021 8:44:18 AM By: Lenda Kelp PA-C Entered By: Lenda Kelp on 10/03/2021 08:44:18 Richard Mendoza ([id number]) -------------------------------------------------------------------------------- Problem List Details Patient Name: Richard Mendoza Date of Service: 10/03/2021 8:30 AM Medical Record Number: [id number] Patient Account Number: [id number] Date of Birth/Sex: January 24, 1976 (45 y.o. M) Treating RN: Hansel Feinstein Primary Care Provider: Barbette Reichmann Other Clinician: Referring Provider: Barbette Reichmann Treating Provider/Extender: Rowan Blase in Treatment: 2 Active Problems ICD-10 Encounter Code Description Active Date MDM Diagnosis E11.622 Type 2 diabetes mellitus with other skin ulcer 09/19/2021 No Yes L98.492 Non-pressure chronic ulcer of skin of other sites with fat layer exposed 09/19/2021 No Yes Z89.512 Acquired absence of left leg below knee 09/19/2021 No Yes I10 Essential (primary) hypertension 09/19/2021 No Yes Inactive Problems Resolved Problems Electronic Signature(s) Signed: 10/03/2021 8:44:10 AM By: Lenda Kelp PA-C Entered By: Lenda Kelp on 10/03/2021 08:44:10

## 2021-10-10 ENCOUNTER — Other Ambulatory Visit: Payer: Self-pay

## 2021-10-10 ENCOUNTER — Encounter: Payer: Medicaid Other | Admitting: Physician Assistant

## 2021-10-10 ENCOUNTER — Ambulatory Visit (INDEPENDENT_AMBULATORY_CARE_PROVIDER_SITE_OTHER): Payer: Medicaid Other | Admitting: Vascular Surgery

## 2021-10-10 ENCOUNTER — Encounter (INDEPENDENT_AMBULATORY_CARE_PROVIDER_SITE_OTHER): Payer: Self-pay | Admitting: Vascular Surgery

## 2021-10-10 VITALS — BP 177/108 | HR 94 | Resp 16

## 2021-10-10 DIAGNOSIS — Z89512 Acquired absence of left leg below knee: Secondary | ICD-10-CM

## 2021-10-10 DIAGNOSIS — I10 Essential (primary) hypertension: Secondary | ICD-10-CM

## 2021-10-10 DIAGNOSIS — E1142 Type 2 diabetes mellitus with diabetic polyneuropathy: Secondary | ICD-10-CM

## 2021-10-10 DIAGNOSIS — E11622 Type 2 diabetes mellitus with other skin ulcer: Secondary | ICD-10-CM | POA: Diagnosis not present

## 2021-10-10 DIAGNOSIS — I739 Peripheral vascular disease, unspecified: Secondary | ICD-10-CM

## 2021-10-10 NOTE — Assessment & Plan Note (Signed)
His wound is almost completely healed.  I will write him a new prescription for a new left BKA prosthesis as the one he has is currently not functional.  He already has an appointment for next year and will keep that.

## 2021-10-10 NOTE — Progress Notes (Signed)
MRN : [id number]  Richard Mendoza is a 45 y.o. (14-Sep-1976) male who presents with chief complaint of  Chief Complaint  Patient presents with   Follow-up    Updated note for prosthetic prescription   .    History of Present Illness: Patient returns today in follow up of His left below-knee amputation wound.  He developed a medial ulceration in the lower thigh across the knee and upper calf area due to a poorly fitting prosthesis.  With his neuropathy, he rubbed the area very Raw and then it got infected.  He was actually admitted to the hospital and given antibiotics and local wound care has markedly improved this wound and he has only a small amount residual.  His prosthesis is not currently functional and he clearly needs a new prosthesis.  Current Outpatient Medications  Medication Sig Dispense Refill   atorvastatin (LIPITOR) 40 MG tablet Take 40 mg by mouth daily.   3   benazepril (LOTENSIN) 20 MG tablet Take 20 mg by mouth daily.     busPIRone (BUSPAR) 15 MG tablet Take 15 mg by mouth 2 (two) times daily.     collagenase (SANTYL) ointment Apply topically daily. 15 g 0   [employer] SENNA 8.6 MG tablet Take 1 tablet by mouth 2 (two) times daily as needed for constipation.  5   dicyclomine (BENTYL) 10 MG capsule Take 10 mg by mouth in the morning and at bedtime.     famotidine (PEPCID) 20 MG tablet Take 20 mg by mouth daily.     FIASP 100 UNIT/ML SOLN Inject into the skin continuous. Via pump     gabapentin (NEURONTIN) 300 MG capsule Take 1 capsule (300 mg total) by mouth 2 (two) times daily with breakfast and lunch AND 2 capsules (600 mg total) at bedtime. (Patient taking differently: 946m 2 times a day) 120 capsule 3   linaclotide (LINZESS) 72 MCG capsule Take 72 mcg by mouth daily as needed (constipation).     methocarbamol (ROBAXIN) 500 MG tablet Take 2 tablets by mouth 2 (two) times daily as needed for muscle spasms.     metoCLOPramide (REGLAN) 10 MG tablet Take 10 mg by mouth daily.      ondansetron (ZOFRAN) 8 MG tablet Take 8 mg by mouth every 6 (six) hours as needed for nausea/vomiting.     pantoprazole (PROTONIX) 40 MG tablet Take 40 mg by mouth daily.  5   sertraline (ZOLOFT) 100 MG tablet Take 100 mg by mouth daily.   5   sildenafil (REVATIO) 20 MG tablet Take 100 mg by mouth daily as needed (erectile dysfunction).     traZODone (DESYREL) 100 MG tablet Take 100 mg by mouth at bedtime.     vitamin B-12 (CYANOCOBALAMIN) 1000 MCG tablet Take 1,000 mcg by mouth in the morning and at bedtime.      oxyCODONE (OXY IR/ROXICODONE) 5 MG immediate release tablet Take 1 tablet (5 mg total) by mouth every 4 (four) hours as needed for moderate pain or severe pain. (Patient not taking: Reported on 09/06/2021) 28 tablet 0   No current facility-administered medications for this visit.    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;  Surgeon:  Sharlotte [place]amo, DPM;  Location: [facility];  Service: Podiatry;  Laterality: Left;   AMPUTATION Left 11/16/2020   Procedure: AMPUTATION BELOW KNEE;  Surgeon: Algernon Huxley, MD;  Location: [facility];  Service: Vascular;  Laterality: Left;   ANTERIOR CERVICAL DECOMP/DISCECTOMY FUSION N/A 09/09/2017   Procedure: ANTERIOR CERVICAL DECOMPRESSION/DISCECTOMY FUSION CERVICAL 6- CERVICAL 7;  Surgeon: Ashok Pall, MD;  Location: [place];  Service: Neurosurgery;  Laterality: N/A;  ANTERIOR CERVICAL DECOMPRESSION/DISCECTOMY FUSION CERVICAL 6- CERVICAL 7   APPENDECTOMY     I & D EXTREMITY Right 10/03/2017   Procedure: IRRIGATION AND DEBRIDEMENT RIGHT WRIST;  Surgeon: Leanora Cover, MD;  Location: [place];  Service: Orthopedics;  Laterality: Right;   I & D EXTREMITY Right 11/26/2018    Procedure: IRRIGATION AND DEBRIDEMENT FASCIA ON RIGHT FOOT;  Surgeon: Samara Deist, DPM;  Location: [facility];  Service: Podiatry;  Laterality: Right;   INCISION AND DRAINAGE Right 03/06/2019   Procedure: INCISION AND DRAINAGE RIGHT FOOT, WITH 4th RAY AMPUTATION;  Surgeon: Samara Deist, DPM;  Location: [facility];  Service: Podiatry;  Laterality: Right;   INCISION AND DRAINAGE Left 11/07/2020   Procedure: INCISION AND DRAINAGE;  Surgeon: Sharlotte Alamo, DPM;  Location: [facility];  Service: Podiatry;  Laterality: Left;   IRRIGATION AND DEBRIDEMENT FOOT Left 11/11/2020   Procedure: IRRIGATION AND DEBRIDEMENT FOOT;  Surgeon: Sharlotte Alamo, DPM;  Location: [facility];  Service: Podiatry;  Laterality: Left;   METATARSAL HEAD EXCISION Right 05/15/2019   Procedure: OSTECTOMY;MET HEAD 5;  Surgeon: Samara Deist, DPM;  Location: [facility];  Service: Podiatry;  Laterality: Right;   osteomylitis     ROTATOR CUFF REPAIR Left      Social History   Tobacco Use   Smoking status: Former    Packs/day: 1.00    Years: 17.00    Pack years: 17.00    Types: Cigarettes    Quit date: 01/14/2019    Years since quitting: 2.7   Smokeless tobacco: Never  Vaping Use   Vaping Use: Never used  Substance Use Topics   Alcohol use: Yes    Comment: rare   Drug use: No      Family History  Problem Relation Age of Onset   Diabetes Mother    Heart disease Father    Diabetes Father    Arthritis Other    Hyperlipidemia Other    Hypertension Other    Cancer Other        breast   Mental illness Neg Hx      No Known Allergies   REVIEW OF SYSTEMS (Negative unless checked)  Constitutional: _0 Weight loss  _1 Fever  _2 Chills Cardiac: _3 Chest pain   _4 Chest pressure   _5 Palpitations   _6 Shortness of breath when laying flat   _7 Shortness of breath at rest   _8 Shortness of breath with exertion. Vascular:  _9 Pain in legs with walking   _10 Pain in legs at rest   _11 Pain in legs when laying flat   _12 Claudication   _13 Pain  in feet when walking  _14 Pain in feet at rest  _15 Pain in feet when laying flat   _16 History of DVT   _17 Phlebitis   _18 Swelling in legs   _19 Varicose veins   _20 Non-healing ulcers Pulmonary:   _21 Uses home oxygen   _22 Productive cough   _23 Hemoptysis   _24 Wheeze  _25 COPD   _26 Asthma Neurologic:  _27 Dizziness  _28 Blackouts   _29 Seizures   _30 History of stroke   _31 History of TIA  _32 Aphasia   _33 Temporary blindness   _34 Dysphagia   _35   Weakness or numbness in arms   _0 Weakness or numbness in legs Musculoskeletal:  _1 Arthritis   _2 Joint swelling   _3 Joint pain   _4 Low back pain Hematologic:  _5 Easy bruising  _6 Easy bleeding   _7 Hypercoagulable state   _8 Anemic   Gastrointestinal:  _9 Blood in stool   _10 Vomiting blood  _11 Gastroesophageal reflux/heartburn   _12 Abdominal pain Genitourinary:  _13 Chronic kidney disease   _14 Difficult urination  _15 Frequent urination  _16 Burning with urination   _17 Hematuria Skin:  _18 Rashes   _19 Ulcers   _20 Wounds Psychological:  _21 History of anxiety   _22  History of major depression.  Physical Examination  BP (!) 177/108 (BP Location: Left Arm)   Pulse 94   Resp 16  Gen:  WD/WN, NAD Head: [place]/AT, No temporalis wasting. Ear/Nose/Throat: Hearing grossly intact, nares w/o erythema or drainage Eyes: Conjunctiva clear. Sclera non-icteric Neck: Supple.  Trachea midline Pulmonary:  Good air movement, no use of accessory muscles.  Cardiac: RRR, no JVD Vascular:  Vessel Right Left  Radial Palpable Palpable                          PT Palpable Not Palpable  DP Palpable Not Palpable    Musculoskeletal: M/S 5/5 throughout.  Left BKA now with only a quarter sized ulcer with granulation.  Markedly improved.  No erythema or drainage. Neurologic: Sensation grossly intact in extremities.  Symmetrical.  Speech is fluent.  Psychiatric: Judgment intact, Mood & affect appropriate for pt's clinical situation. Dermatologic: No rashes or ulcers noted.  No cellulitis or open wounds.      Labs Recent  Results (from the past 2160 hour(s))  Aerobic culture     Status: Abnormal   Collection Time: 08/01/21  3:50 AM  Result Value Ref Range   Aerobic Bacterial Culture Final report (A)    Organism ID, Bacteria Comment (A)     Comment: Beta hemolytic Streptococcus, group B Heavy growth Penicillin and ampicillin are drugs of choice for treatment of beta-hemolytic streptococcal infections. Susceptibility testing of penicillins and other beta-lactam agents approved by the FDA for treatment of beta-hemolytic streptococcal infections need not be performed routinely because nonsusceptible isolates are extremely rare in any beta-hemolytic streptococcus and have not been reported for Streptococcus pyogenes (group A). (CLSI)    Organism ID, Bacteria Routine flora     Comment: Scant growth  CBC     Status: Abnormal   Collection Time: 09/06/21  3:36 PM  Result Value Ref Range   WBC 13.6 (H) 4.0 - 10.5 K/uL   RBC 3.71 (L) 4.22 - 5.81 MIL/uL   Hemoglobin 10.6 (L) 13.0 - 17.0 g/dL   HCT 30.6 (L) 39.0 - 52.0 %   MCV 82.5 80.0 - 100.0 fL   MCH 28.6 26.0 - 34.0 pg   MCHC 34.6 30.0 - 36.0 g/dL   RDW 14.5 11.5 - 15.5 %   Platelets 309 150 - 400 K/uL   nRBC 0.0 0.0 - 0.2 %    Comment: Performed at [facility], [address] [place], [place] [address]  Comprehensive metabolic panel     Status: Abnormal   Collection Time: 09/06/21  3:36 PM  Result Value Ref Range   Sodium 132 (L) 135 - 145 mmol/L   Potassium 3.0 (L) 3.5 - 5.1 mmol/L   Chloride 102 98 - 111 mmol/L   CO2 21 (L) 22 - 32 mmol/L   Glucose, Bld 469 (H) 70 - 99 mg/dL    Comment: Glucose reference  range applies only to samples taken after fasting for at least 8 hours.   BUN 30 (H) 6 - 20 mg/dL   Creatinine, Ser 1.92 (H) 0.61 - 1.24 mg/dL   Calcium 9.0 8.9 - 10.3 mg/dL   Total Protein 7.1 6.5 - 8.1 g/dL   Albumin 2.8 (L) 3.5 - 5.0 g/dL   AST 19 15 - 41 U/L   ALT 14 0 - 44 U/L   Alkaline Phosphatase 99 38 - 126 U/L   Total  Bilirubin 0.5 0.3 - 1.2 mg/dL   GFR, Estimated 43 (L) >60 mL/min    Comment: (NOTE) Calculated using the CKD-EPI Creatinine Equation (2021)    Anion gap 9 5 - 15    Comment: Performed at [facility], [address] [place], [place] [address]  Lactic acid, plasma     Status: Abnormal   Collection Time: 09/06/21  3:36 PM  Result Value Ref Range   Lactic Acid, Venous 2.4 (HH) 0.5 - 1.9 mmol/L    Comment: CRITICAL RESULT CALLED TO, READ BACK BY AND VERIFIED WITH JESSICA Ozark, RN AT [phone] 09/06/21 BY [facility] Performed at [facility], [address] [place], [place] [address]   Hemoglobin A1c     Status: Abnormal   Collection Time: 09/06/21  3:36 PM  Result Value Ref Range   Hgb A1c MFr Bld 14.6 (H) 4.8 - 5.6 %    Comment: (NOTE)         Prediabetes: 5.7 - 6.4         Diabetes: >6.4         Glycemic control for adults with diabetes: <7.0    Mean Plasma Glucose 372 mg/dL    Comment: (NOTE) Performed At: [facility] [place], [place] [id number] Rush Farmer MD [id number]   Sedimentation rate     Status: Abnormal   Collection Time: 09/06/21  5:08 PM  Result Value Ref Range   Sed Rate 82 (H) 0 - 15 mm/hr    Comment: Performed at [facility], [address] [place], [place] [address]  Lactic acid, plasma     Status: Abnormal   Collection Time: 09/06/21  5:08 PM  Result Value Ref Range   Lactic Acid, Venous 2.1 (HH) 0.5 - 1.9 mmol/L    Comment: CRITICAL VALUE NOTED. VALUE IS CONSISTENT WITH PREVIOUSLY REPORTED/CALLED VALUE SKL Performed at [facility], [address] [place], [place] [address]   Comprehensive metabolic panel     Status: Abnormal   Collection Time: 09/06/21  5:08 PM  Result Value Ref Range   Sodium 129 (L) 135 - 145 mmol/L   Potassium 2.7 (LL) 3.5 - 5.1 mmol/L    Comment: CRITICAL RESULT CALLED TO, READ BACK BY AND VERIFIED WITH BRANDON GROGG _0  ON 09/06/21 SKL    Chloride 99 98 - 111  mmol/L   CO2 21 (L) 22 - 32 mmol/L   Glucose, Bld 454 (H) 70 - 99 mg/dL    Comment: Glucose reference range applies only to samples taken after fasting for at least 8 hours.   BUN 32 (H) 6 - 20 mg/dL   Creatinine, Ser 1.77 (H) 0.61 - 1.24 mg/dL   Calcium 8.8 (L) 8.9 - 10.3 mg/dL   Total Protein 7.3 6.5 - 8.1 g/dL   Albumin 2.9 (L) 3.5 - 5.0 g/dL   AST 19 15 - 41 U/L   ALT 14 0 - 44 U/L   Alkaline Phosphatase 100 38 -  126 U/L   Total Bilirubin 0.6 0.3 - 1.2 mg/dL   GFR, Estimated 48 (L) >60 mL/min    Comment: (NOTE) Calculated using the CKD-EPI Creatinine Equation (2021)    Anion gap 9 5 - 15    Comment: Performed at [facility], [address] [place], [place] [address]  CBC WITH DIFFERENTIAL     Status: Abnormal   Collection Time: 09/06/21  5:08 PM  Result Value Ref Range   WBC 13.9 (H) 4.0 - 10.5 K/uL   RBC 3.72 (L) 4.22 - 5.81 MIL/uL   Hemoglobin 10.7 (L) 13.0 - 17.0 g/dL   HCT 30.9 (L) 39.0 - 52.0 %   MCV 83.1 80.0 - 100.0 fL   MCH 28.8 26.0 - 34.0 pg   MCHC 34.6 30.0 - 36.0 g/dL   RDW 14.5 11.5 - 15.5 %   Platelets 322 150 - 400 K/uL   nRBC 0.0 0.0 - 0.2 %   Neutrophils Relative % 83 %   Neutro Abs 11.6 (H) 1.7 - 7.7 K/uL   Lymphocytes Relative 9 %   Lymphs Abs 1.3 0.7 - 4.0 K/uL   Monocytes Relative 6 %   Monocytes Absolute 0.9 0.1 - 1.0 K/uL   Eosinophils Relative 1 %   Eosinophils Absolute 0.1 0.0 - 0.5 K/uL   Basophils Relative 0 %   Basophils Absolute 0.0 0.0 - 0.1 K/uL   Immature Granulocytes 1 %   Abs Immature Granulocytes 0.09 (H) 0.00 - 0.07 K/uL    Comment: Performed at [facility], [address] [place], [place] [address]  Protime-INR     Status: None   Collection Time: 09/06/21  5:08 PM  Result Value Ref Range   Prothrombin Time 14.1 11.4 - 15.2 seconds   INR 1.1 0.8 - 1.2    Comment: (NOTE) INR goal varies based on device and disease states. Performed at [facility], [address] [place], [place] [address]    APTT     Status: None   Collection Time: 09/06/21  5:08 PM  Result Value Ref Range   aPTT 29 24 - 36 seconds    Comment: Performed at [facility], [address] [place], [place] [address]  Blood Culture (routine x 2)     Status: None   Collection Time: 09/06/21  5:08 PM   Specimen: BLOOD  Result Value Ref Range   Specimen Description BLOOD BLOOD LEFT FOREARM    Special Requests      BOTTLES DRAWN AEROBIC AND ANAEROBIC Blood Culture adequate volume   Culture      NO GROWTH 5 DAYS Performed at [facility], [address] [place], [place] [address]    Report Status 09/11/2021 FINAL   Blood Culture (routine x 2)     Status: None   Collection Time: 09/06/21  5:08 PM   Specimen: BLOOD  Result Value Ref Range   Specimen Description BLOOD RIGHT ANTECUBITAL    Special Requests      BOTTLES DRAWN AEROBIC AND ANAEROBIC Blood Culture results may not be optimal due to an excessive volume of blood received in culture bottles   Culture      NO GROWTH 5 DAYS Performed at [facility], [address] [place], [place] [address]    Report Status 09/11/2021 FINAL   CK     Status: None   Collection Time: 09/06/21  5:08 PM  Result Value Ref Range   Total CK 141 49 -  397 U/L    Comment: Performed at [facility], [address] [place], [place] [address]  Resp Panel by RT-PCR (Flu A&B, Covid) Nasopharyngeal Swab     Status: None   Collection Time: 09/06/21  5:35 PM   Specimen: Nasopharyngeal Swab; Nasopharyngeal(NP) swabs in vial transport medium  Result Value Ref Range   SARS Coronavirus 2 by RT PCR NEGATIVE NEGATIVE    Comment: (NOTE) SARS-CoV-2 target nucleic acids are NOT DETECTED.  The SARS-CoV-2 RNA is generally detectable in upper respiratory specimens during the acute phase of infection. The lowest concentration of SARS-CoV-2 viral copies this assay can detect is 138 copies/mL. A negative result does not preclude SARS-Cov-2 infection and  should not be used as the sole basis for treatment or other patient management decisions. A negative result may occur with  improper specimen collection/handling, submission of specimen other than nasopharyngeal swab, presence of viral mutation(s) within the areas targeted by this assay, and inadequate number of viral copies(<138 copies/mL). A negative result must be combined with clinical observations, patient history, and epidemiological information. The expected result is Negative.  Fact Sheet for Patients:  EntrepreneurPulse.com.au  Fact Sheet for Healthcare Providers:  IncredibleEmployment.be  This test is no t yet approved or cleared by the [place] FDA and  has been authorized for detection and/or diagnosis of SARS-CoV-2 by FDA under an Emergency Use Authorization (EUA). This EUA will remain  in effect (meaning this test can be used) for the duration of the COVID-19 declaration under Section 564(b)(1) of the Act, 21 U.S.C.section 360bbb-3(b)(1), unless the authorization is terminated  or revoked sooner.       Influenza A by PCR NEGATIVE NEGATIVE   Influenza B by PCR NEGATIVE NEGATIVE    Comment: (NOTE) The Xpert Xpress SARS-CoV-2/FLU/RSV plus assay is intended as an aid in the diagnosis of influenza from Nasopharyngeal swab specimens and should not be used as a sole basis for treatment. Nasal washings and aspirates are unacceptable for Xpert Xpress SARS-CoV-2/FLU/RSV testing.  Fact Sheet for Patients: EntrepreneurPulse.com.au  Fact Sheet for Healthcare Providers: IncredibleEmployment.be  This test is not yet approved or cleared by the [place] FDA and has been authorized for detection and/or diagnosis of SARS-CoV-2 by FDA under an Emergency Use Authorization (EUA). This EUA will remain in effect (meaning this test can be used) for the duration of the COVID-19 declaration under Section  564(b)(1) of the Act, 21 U.S.C. section 360bbb-3(b)(1), unless the authorization is terminated or revoked.  Performed at [facility], [address] [place], [place] [address]   Urinalysis, Complete w Microscopic Nasopharyngeal Swab     Status: Abnormal   Collection Time: 09/06/21  5:35 PM  Result Value Ref Range   Color, Urine YELLOW (A) YELLOW   APPearance CLEAR (A) CLEAR   Specific Gravity, Urine 1.024 1.005 - 1.030   pH 5.0 5.0 - 8.0   Glucose, UA >=500 (A) NEGATIVE mg/dL   Hgb urine dipstick SMALL (A) NEGATIVE   Bilirubin Urine NEGATIVE NEGATIVE   Ketones, ur NEGATIVE NEGATIVE mg/dL   Protein, ur 30 (A) NEGATIVE mg/dL   Nitrite NEGATIVE NEGATIVE   Leukocytes,Ua NEGATIVE NEGATIVE   RBC / HPF 0-5 0 - 5 RBC/hpf   WBC, UA 0-5 0 - 5 WBC/hpf   Bacteria, UA NONE SEEN NONE SEEN   Squamous Epithelial / LPF 0-5 0 - 5   Mucus PRESENT     Comment: Performed at [facility], [address], [place], [place] [address]  Urine Culture     Status: Abnormal   Collection Time: 09/06/21  5:35 PM   Specimen: Urine, Random  Result Value Ref Range   Specimen Description      URINE, RANDOM Performed at [facility], [address] [place], [place] [address]    Special Requests      NONE Performed at [facility], [address] [place], [place] [address]    Culture MULTIPLE SPECIES PRESENT, SUGGEST RECOLLECTION (A)    Report Status 09/08/2021 FINAL   Lactic acid, plasma     Status: None   Collection Time: 09/06/21  6:46 PM  Result Value Ref Range   Lactic Acid, Venous 1.8 0.5 - 1.9 mmol/L    Comment: Performed at [facility], [address] [place], [place] [address]  Aerobic Culture w Gram Stain (superficial specimen)     Status: None   Collection Time: 09/06/21  7:57 PM   Specimen: Wound  Result Value Ref Range   Specimen Description      WOUND Performed at [facility], [address], [place], [place] [address]     Special Requests      NONE Performed at [facility], [address], [place], [place] [address]    Gram Stain      FEW SQUAMOUS EPITHELIAL CELLS PRESENT MODERATE WBC SEEN MODERATE GRAM POSITIVE COCCI    Culture      FEW GROUP B STREP(S.AGALACTIAE)ISOLATED TESTING AGAINST S. AGALACTIAE NOT ROUTINELY PERFORMED DUE TO PREDICTABILITY OF AMP/PEN/VAN SUSCEPTIBILITY. WITHIN MIXED ORGANISMS Performed at [facility], [place] [address] [place], [place] [address]    Report Status 09/09/2021 FINAL   Lactic acid, plasma     Status: None   Collection Time: 09/06/21  9:33 PM  Result Value Ref Range   Lactic Acid, Venous 1.6 0.5 - 1.9 mmol/L    Comment: Performed at [facility], [address] [place], [place] [address]  Glucose, capillary     Status: Abnormal   Collection Time: 09/06/21 11:39 PM  Result Value Ref Range   Glucose-Capillary 286 (H) 70 - 99 mg/dL    Comment: Glucose reference range applies only to samples taken after fasting for at least 8 hours.  Comprehensive metabolic panel     Status: Abnormal   Collection Time: 09/07/21  6:05 AM  Result Value Ref Range   Sodium 135 135 - 145 mmol/L   Potassium 3.3 (L) 3.5 - 5.1 mmol/L   Chloride 108 98 - 111 mmol/L   CO2 21 (L) 22 - 32 mmol/L   Glucose, Bld 100 (H) 70 - 99 mg/dL    Comment: Glucose reference range applies only to samples taken after fasting for at least 8 hours.   BUN 24 (H) 6 - 20 mg/dL   Creatinine, Ser 1.05 0.61 - 1.24 mg/dL   Calcium 8.6 (L) 8.9 - 10.3 mg/dL   Total Protein 6.2 (L) 6.5 - 8.1 g/dL   Albumin 2.3 (L) 3.5 - 5.0 g/dL   AST 13 (L) 15 - 41 U/L   ALT 12 0 - 44 U/L   Alkaline Phosphatase 75 38 - 126 U/L   Total Bilirubin 0.3 0.3 - 1.2 mg/dL   GFR, Estimated >60 >60 mL/min    Comment: (NOTE) Calculated using the CKD-EPI Creatinine Equation (2021)    Anion gap 6 5 - 15    Comment: Performed at [facility], [address], [place], [place] [address]  CBC  Status: Abnormal   Collection Time: 09/07/21  6:05 AM  Result Value Ref Range   WBC 11.8 (H) 4.0 - 10.5 K/uL   RBC 3.41 (L) 4.22 - 5.81 MIL/uL   Hemoglobin 9.6 (L) 13.0 - 17.0 g/dL   HCT 28.4 (L) 39.0 - 52.0 %   MCV 83.3 80.0 - 100.0 fL   MCH 28.2 26.0 - 34.0 pg   MCHC 33.8 30.0 - 36.0 g/dL   RDW 14.6 11.5 - 15.5 %   Platelets 300 150 - 400 K/uL   nRBC 0.0 0.0 - 0.2 %    Comment: Performed at [facility], [address] [place], [place] [address]  Glucose, capillary     Status: None   Collection Time: 09/07/21  7:40 AM  Result Value Ref Range   Glucose-Capillary 72 70 - 99 mg/dL    Comment: Glucose reference range applies only to samples taken after fasting for at least 8 hours.  Glucose, capillary     Status: Abnormal   Collection Time: 09/07/21 11:31 AM  Result Value Ref Range   Glucose-Capillary 168 (H) 70 - 99 mg/dL    Comment: Glucose reference range applies only to samples taken after fasting for at least 8 hours.   Comment 1 Notify RN    Comment 2 Document in Chart   Glucose, capillary     Status: None   Collection Time: 09/07/21  4:24 PM  Result Value Ref Range   Glucose-Capillary 85 70 - 99 mg/dL    Comment: Glucose reference range applies only to samples taken after fasting for at least 8 hours.   Comment 1 Notify RN    Comment 2 Document in Chart   Glucose, capillary     Status: Abnormal   Collection Time: 09/07/21  7:52 PM  Result Value Ref Range   Glucose-Capillary 64 (L) 70 - 99 mg/dL    Comment: Glucose reference range applies only to samples taken after fasting for at least 8 hours.   Comment 1 Notify RN    Comment 2 Document in Chart   Glucose, capillary     Status: None   Collection Time: 09/07/21 10:12 PM  Result Value Ref Range   Glucose-Capillary 84 70 - 99 mg/dL    Comment: Glucose reference range applies only to samples taken after fasting for at least 8 hours.   Comment 1 Notify RN    Comment 2 Document in Chart   Glucose, capillary      Status: Abnormal   Collection Time: 09/08/21  3:17 AM  Result Value Ref Range   Glucose-Capillary 61 (L) 70 - 99 mg/dL    Comment: Glucose reference range applies only to samples taken after fasting for at least 8 hours.   Comment 1 Notify RN    Comment 2 Document in Chart   Glucose, capillary     Status: None   Collection Time: 09/08/21  3:40 AM  Result Value Ref Range   Glucose-Capillary 84 70 - 99 mg/dL    Comment: Glucose reference range applies only to samples taken after fasting for at least 8 hours.  Basic metabolic panel     Status: Abnormal   Collection Time: 09/08/21  3:54 AM  Result Value Ref Range   Sodium 135 135 - 145 mmol/L   Potassium 2.9 (L) 3.5 - 5.1 mmol/L   Chloride 110 98 - 111 mmol/L   CO2 22 22 - 32 mmol/L   Glucose, Bld 86 70 - 99 mg/dL  Comment: Glucose reference range applies only to samples taken after fasting for at least 8 hours.   BUN 18 6 - 20 mg/dL   Creatinine, Ser 1.19 0.61 - 1.24 mg/dL   Calcium 8.5 (L) 8.9 - 10.3 mg/dL   GFR, Estimated >60 >60 mL/min    Comment: (NOTE) Calculated using the CKD-EPI Creatinine Equation (2021)    Anion gap 3 (L) 5 - 15    Comment: Performed at [facility], [address] [place], [place] [address]  CBC     Status: Abnormal   Collection Time: 09/08/21  3:54 AM  Result Value Ref Range   WBC 10.8 (H) 4.0 - 10.5 K/uL   RBC 3.18 (L) 4.22 - 5.81 MIL/uL   Hemoglobin 8.9 (L) 13.0 - 17.0 g/dL   HCT 26.9 (L) 39.0 - 52.0 %   MCV 84.6 80.0 - 100.0 fL   MCH 28.0 26.0 - 34.0 pg   MCHC 33.1 30.0 - 36.0 g/dL   RDW 14.9 11.5 - 15.5 %   Platelets 294 150 - 400 K/uL   nRBC 0.0 0.0 - 0.2 %    Comment: Performed at [facility], [address] [place], [place] [address]  Magnesium     Status: None   Collection Time: 09/08/21  3:54 AM  Result Value Ref Range   Magnesium 1.7 1.7 - 2.4 mg/dL    Comment: Performed at [facility], [address] [place], [place] [address]  Glucose,  capillary     Status: Abnormal   Collection Time: 09/08/21  7:21 AM  Result Value Ref Range   Glucose-Capillary 65 (L) 70 - 99 mg/dL    Comment: Glucose reference range applies only to samples taken after fasting for at least 8 hours.  Glucose, capillary     Status: Abnormal   Collection Time: 09/08/21  8:28 AM  Result Value Ref Range   Glucose-Capillary 66 (L) 70 - 99 mg/dL    Comment: Glucose reference range applies only to samples taken after fasting for at least 8 hours.  Glucose, capillary     Status: Abnormal   Collection Time: 09/08/21  9:38 AM  Result Value Ref Range   Glucose-Capillary 110 (H) 70 - 99 mg/dL    Comment: Glucose reference range applies only to samples taken after fasting for at least 8 hours.  Glucose, capillary     Status: Abnormal   Collection Time: 09/08/21 10:56 AM  Result Value Ref Range   Glucose-Capillary 123 (H) 70 - 99 mg/dL    Comment: Glucose reference range applies only to samples taken after fasting for at least 8 hours.  Glucose, capillary     Status: Abnormal   Collection Time: 09/08/21  4:26 PM  Result Value Ref Range   Glucose-Capillary 159 (H) 70 - 99 mg/dL    Comment: Glucose reference range applies only to samples taken after fasting for at least 8 hours.  Glucose, capillary     Status: None   Collection Time: 09/08/21  8:39 PM  Result Value Ref Range   Glucose-Capillary 98 70 - 99 mg/dL    Comment: Glucose reference range applies only to samples taken after fasting for at least 8 hours.   Comment 1 Notify RN   Glucose, capillary     Status: Abnormal   Collection Time: 09/09/21 12:21 AM  Result Value Ref Range   Glucose-Capillary 46 (L) 70 - 99 mg/dL    Comment: Glucose reference range applies only to samples  taken after fasting for at least 8 hours.  Glucose, capillary     Status: Abnormal   Collection Time: 09/09/21 12:34 AM  Result Value Ref Range   Glucose-Capillary 43 (LL) 70 - 99 mg/dL    Comment: Glucose reference range  applies only to samples taken after fasting for at least 8 hours.   Comment 1 Repeat Test   Glucose, capillary     Status: Abnormal   Collection Time: 09/09/21 12:43 AM  Result Value Ref Range   Glucose-Capillary 51 (L) 70 - 99 mg/dL    Comment: Glucose reference range applies only to samples taken after fasting for at least 8 hours.   Comment 1 Repeat Test   Glucose, capillary     Status: Abnormal   Collection Time: 09/09/21  1:08 AM  Result Value Ref Range   Glucose-Capillary 211 (H) 70 - 99 mg/dL    Comment: Glucose reference range applies only to samples taken after fasting for at least 8 hours.  Glucose, capillary     Status: Abnormal   Collection Time: 09/09/21  2:08 AM  Result Value Ref Range   Glucose-Capillary 211 (H) 70 - 99 mg/dL    Comment: Glucose reference range applies only to samples taken after fasting for at least 8 hours.  Glucose, capillary     Status: Abnormal   Collection Time: 09/09/21  6:09 AM  Result Value Ref Range   Glucose-Capillary 192 (H) 70 - 99 mg/dL    Comment: Glucose reference range applies only to samples taken after fasting for at least 8 hours.  Glucose, capillary     Status: Abnormal   Collection Time: 09/09/21  8:41 AM  Result Value Ref Range   Glucose-Capillary 142 (H) 70 - 99 mg/dL    Comment: Glucose reference range applies only to samples taken after fasting for at least 8 hours.  Glucose, capillary     Status: Abnormal   Collection Time: 09/09/21 12:13 PM  Result Value Ref Range   Glucose-Capillary 110 (H) 70 - 99 mg/dL    Comment: Glucose reference range applies only to samples taken after fasting for at least 8 hours.    Radiology No results found.  Assessment/Plan Essential hypertension blood pressure control important in reducing the progression of atherosclerotic disease. On appropriate oral medications.     DM type 2 with diabetic peripheral neuropathy (HCC) blood glucose control important in reducing the progression of  atherosclerotic disease. Also, involved in wound healing. On appropriate medications.  S/P BKA (below knee amputation), left ([place]) His wound is almost completely healed.  I will write him a new prescription for a new left BKA prosthesis as the one he has is currently not functional.  He already has an appointment for next year and will keep that.    Leotis Pain, MD  10/10/2021 2:41 PM    This note was created with Dragon medical transcription system.  Any errors from dictation are purely unintentional

## 2021-10-10 NOTE — Progress Notes (Signed)
Richard, Mendoza ([id number]) Visit Report for 10/10/2021 Arrival Information Details Patient Name: Richard Mendoza, Richard Mendoza Date of Service: 10/10/2021 8:30 AM Medical Record Number: [id number] Patient Account Number: [id number] Date of Birth/Sex: 08-31-1976 (45 y.o. M) Treating RN: Donnamarie Poag Primary Care Jden Want: Tracie Harrier Other Clinician: Referring Calina Patrie: Tracie Harrier Treating Sharmeka Palmisano/Extender: Skipper Cliche in Treatment: 3 Visit Information History Since Last Visit Added or deleted any medications: No Patient Arrived: Wheel Chair Had a fall or experienced change in No Arrival Time: 08:31 activities of daily living that may affect Accompanied By: self risk of falls: Transfer Assistance: None Hospitalized since last visit: No Patient Identification Verified: Yes Has Dressing in Place as Prescribed: Yes Secondary Verification Process Completed: Yes Pain Present Now: No Patient Requires Transmission-Based No Precautions: Patient Has Alerts: Yes Patient Alerts: DIABETIC/insulin pump Left BKA AVVS ABI R 1.31 -7/22 Electronic Signature(s) Signed: 10/10/2021 11:32:06 AM By: Donnamarie Poag Entered By: Donnamarie Poag on 10/10/2021 08:37:36 Menchaca, Legrand Como ([id number]) -------------------------------------------------------------------------------- Clinic Level of Care Assessment Details Patient Name: Richard Mendoza Date of Service: 10/10/2021 8:30 AM Medical Record Number: [id number] Patient Account Number: [id number] Date of Birth/Sex: 10/25/1976 (45 y.o. M) Treating RN: Donnamarie Poag Primary Care Uzoma Vivona: Tracie Harrier Other Clinician: Referring Gladine Plude: Tracie Harrier Treating Hasani Diemer/Extender: Skipper Cliche in Treatment: 3 Clinic Level of Care Assessment Items TOOL 4 Quantity Score []  - Use when only an EandM is performed on FOLLOW-UP visit 0 ASSESSMENTS - Nursing Assessment / Reassessment []  - Reassessment of Co-morbidities (includes updates in  patient status) 0 []  - 0 Reassessment of Adherence to Treatment Plan ASSESSMENTS - Wound and Skin Assessment / Reassessment X - Simple Wound Assessment / Reassessment - one wound 1 5 []  - 0 Complex Wound Assessment / Reassessment - multiple wounds []  - 0 Dermatologic / Skin Assessment (not related to wound area) ASSESSMENTS - Focused Assessment []  - Circumferential Edema Measurements - multi extremities 0 []  - 0 Nutritional Assessment / Counseling / Intervention []  - 0 Lower Extremity Assessment (monofilament, tuning fork, pulses) []  - 0 Peripheral Arterial Disease Assessment (using hand held doppler) ASSESSMENTS - Ostomy and/or Continence Assessment and Care []  - Incontinence Assessment and Management 0 []  - 0 Ostomy Care Assessment and Management (repouching, etc.) PROCESS - Coordination of Care X - Simple Patient / Family Education for ongoing care 1 15 []  - 0 Complex (extensive) Patient / Family Education for ongoing care []  - 0 Staff obtains Programmer, systems, Records, Test Results / Process Orders []  - 0 Staff telephones HHA, Nursing Homes / Clarify orders / etc []  - 0 Routine Transfer to another Facility (non-emergent condition) []  - 0 Routine Hospital Admission (non-emergent condition) []  - 0 New Admissions / Biomedical engineer / Ordering NPWT, Apligraf, etc. []  - 0 Emergency Hospital Admission (emergent condition) X- 1 10 Simple Discharge Coordination []  - 0 Complex (extensive) Discharge Coordination PROCESS - Special Needs []  - Pediatric / Minor Patient Management 0 []  - 0 Isolation Patient Management []  - 0 Hearing / Language / Visual special needs []  - 0 Assessment of Community assistance (transportation, D/C planning, etc.) []  - 0 Additional assistance / Altered mentation []  - 0 Support Surface(s) Assessment (bed, cushion, seat, etc.) INTERVENTIONS - Wound Cleansing / Measurement Ciaramitaro, Haik ([id number]) X- 1 5 Simple Wound Cleansing - one  wound []  - 0 Complex Wound Cleansing - multiple wounds X- 1 5 Wound Imaging (photographs - any number of wounds) []  - 0 Wound Tracing (instead of photographs) X- 1 5 Simple Wound Measurement - one wound []  -  0 Complex Wound Measurement - multiple wounds INTERVENTIONS - Wound Dressings X - Small Wound Dressing one or multiple wounds 1 10 []  - 0 Medium Wound Dressing one or multiple wounds []  - 0 Large Wound Dressing one or multiple wounds X- 1 5 Application of Medications - topical []  - 0 Application of Medications - injection INTERVENTIONS - Miscellaneous []  - External ear exam 0 []  - 0 Specimen Collection (cultures, biopsies, blood, body fluids, etc.) []  - 0 Specimen(s) / Culture(s) sent or taken to Lab for analysis []  - 0 Patient Transfer (multiple staff / Civil Service fast streamer / Similar devices) []  - 0 Simple Staple / Suture removal (25 or less) []  - 0 Complex Staple / Suture removal (26 or more) []  - 0 Hypo / Hyperglycemic Management (close monitor of Blood Glucose) []  - 0 Ankle / Brachial Index (ABI) - do not check if billed separately X- 1 5 Vital Signs Has the patient been seen at the hospital within the last three years: Yes Total Score: 65 Level Of Care: New/Established - Level 2 Electronic Signature(s) Signed: 10/10/2021 11:32:06 AM By: Donnamarie Poag Entered By: Donnamarie Poag on 10/10/2021 09:06:59 Richard Mendoza ([id number]) -------------------------------------------------------------------------------- Encounter Discharge Information Details Patient Name: Richard Mendoza Date of Service: 10/10/2021 8:30 AM Medical Record Number: [id number] Patient Account Number: [id number] Date of Birth/Sex: 11-19-1976 (45 y.o. M) Treating RN: Donnamarie Poag Primary Care Sophiarose Eades: Tracie Harrier Other Clinician: Referring Rober Skeels: Tracie Harrier Treating Oswin Johal/Extender: Skipper Cliche in Treatment: 3 Encounter Discharge Information Items Discharge Condition:  Stable Ambulatory Status: Wheelchair Discharge Destination: Home Transportation: Private Auto Accompanied By: self Schedule Follow-up Appointment: Yes Clinical Summary of Care: Electronic Signature(s) Signed: 10/10/2021 11:32:06 AM By: Donnamarie Poag Entered By: Donnamarie Poag on 10/10/2021 09:16:16 Richard Mendoza ([id number]) -------------------------------------------------------------------------------- Lower Extremity Assessment Details Patient Name: Richard Mendoza Date of Service: 10/10/2021 8:30 AM Medical Record Number: [id number] Patient Account Number: [id number] Date of Birth/Sex: June 27, 1976 (45 y.o. M) Treating RN: Donnamarie Poag Primary Care Benjamin Casanas: Tracie Harrier Other Clinician: Referring Yeira Gulden: Tracie Harrier Treating Joshuwa Vecchio/Extender: Jeri Cos Weeks in Treatment: 3 Edema Assessment Assessed: [Left: Yes] [Right: No] Edema: [Left: N] [Right: o] Notes L BKA Electronic Signature(s) Signed: 10/10/2021 11:32:06 AM By: Donnamarie Poag Entered By: Donnamarie Poag on 10/10/2021 08:43:46 Adkins, Legrand Como ([id number]) -------------------------------------------------------------------------------- Multi Wound Chart Details Patient Name: Richard Mendoza Date of Service: 10/10/2021 8:30 AM Medical Record Number: [id number] Patient Account Number: [id number] Date of Birth/Sex: 05/07/1976 (45 y.o. M) Treating RN: Donnamarie Poag Primary Care Marlee Trentman: Tracie Harrier Other Clinician: Referring Ezeriah Luty: Tracie Harrier Treating Brittan Butterbaugh/Extender: Skipper Cliche in Treatment: 3 Vital Signs Height(in): 70 Pulse(bpm): 55 Weight(lbs): 215 Blood Pressure(mmHg): 186/113 Body Mass Index(BMI): 31 Temperature(F): 97.9 Respiratory Rate(breaths/min): 16 Photos: [N/A:N/A] Wound Location: Left, Medial Upper Leg N/A N/A Wounding Event: Blister N/A N/A Primary Etiology: Pressure Ulcer N/A N/A Secondary Etiology: Diabetic Wound/Ulcer of the Lower N/A N/A Extremity Comorbid  History: Coronary Artery Disease, N/A N/A Hypertension, Type II Diabetes, Neuropathy Date Acquired: 08/19/2021 N/A N/A Weeks of Treatment: 3 N/A N/A Wound Status: Open N/A N/A Clustered Wound: Yes N/A N/A Measurements L x W x D (cm) 2x1.5x0.1 N/A N/A Area (cm) : 2.356 N/A N/A Volume (cm) : 0.236 N/A N/A % Reduction in Area: 91.60% N/A N/A % Reduction in Volume: 97.20% N/A N/A Classification: Category/Stage III N/A N/A Exudate Amount: Medium N/A N/A Exudate Type: Serosanguineous N/A N/A Exudate Color: red, brown N/A N/A Granulation Amount: Medium (34-66%) N/A N/A Granulation Quality: Red, Pink N/A N/A Necrotic Amount: Medium (34-66%) N/A  N/A Necrotic Tissue: Eschar, Adherent Slough N/A N/A Exposed Structures: Fat Layer (Subcutaneous Tissue): N/A N/A Yes Fascia: No Tendon: No Muscle: No Joint: No Bone: No Treatment Notes Electronic Signature(s) Signed: 10/10/2021 11:32:06 AM By: Donnamarie Poag Entered By: Donnamarie Poag on 10/10/2021 09:04:08 Richard Mendoza ([id number]) KERT, SHACKETT ([id number]) -------------------------------------------------------------------------------- Multi-Disciplinary Care Plan Details Patient Name: Richard Mendoza Date of Service: 10/10/2021 8:30 AM Medical Record Number: [id number] Patient Account Number: [id number] Date of Birth/Sex: 08-30-1976 (45 y.o. M) Treating RN: Donnamarie Poag Primary Care Quintez Maselli: Tracie Harrier Other Clinician: Referring Clara Smolen: Tracie Harrier Treating Lyniah Fujita/Extender: Skipper Cliche in Treatment: 3 Active Inactive Wound/Skin Impairment Nursing Diagnoses: Impaired tissue integrity Knowledge deficit related to smoking impact on wound healing Knowledge deficit related to ulceration/compromised skin integrity Goals: Patient/caregiver will verbalize understanding of skin care regimen Date Initiated: 09/19/2021 Date Inactivated: 09/26/2021 Target Resolution Date: 10/08/2021 Goal Status: Met Ulcer/skin  breakdown will have a volume reduction of 30% by week 4 Date Initiated: 09/19/2021 Date Inactivated: 10/10/2021 Target Resolution Date: 10/17/2021 Goal Status: Met Ulcer/skin breakdown will have a volume reduction of 50% by week 8 Date Initiated: 09/19/2021 Target Resolution Date: 11/14/2021 Goal Status: Active Ulcer/skin breakdown will have a volume reduction of 80% by week 12 Date Initiated: 09/19/2021 Target Resolution Date: 12/12/2021 Goal Status: Active Ulcer/skin breakdown will heal within 14 weeks Date Initiated: 09/19/2021 Target Resolution Date: 12/30/2021 Goal Status: Active Interventions: Assess patient/caregiver ability to obtain necessary supplies Assess patient/caregiver ability to perform ulcer/skin care regimen upon admission and as needed Assess ulceration(s) every visit Notes: Electronic Signature(s) Signed: 10/10/2021 11:32:06 AM By: Donnamarie Poag Entered By: Donnamarie Poag on 10/10/2021 09:03:52 Dorning, Legrand Como ([id number]) -------------------------------------------------------------------------------- Pain Assessment Details Patient Name: Richard Mendoza Date of Service: 10/10/2021 8:30 AM Medical Record Number: [id number] Patient Account Number: [id number] Date of Birth/Sex: [birth year] (45 y.o. M) Treating RN: Donnamarie Poag Primary Care Rayon Mcchristian: Tracie Harrier Other Clinician: Referring Latissa Frick: Tracie Harrier Treating Tyreke Kaeser/Extender: Skipper Cliche in Treatment: 3 Active Problems Location of Pain Severity and Description of Pain Patient Has Paino No Site Locations Rate the pain. Current Pain Level: 0 Pain Management and Medication Current Pain Management: Electronic Signature(s) Signed: 10/10/2021 11:32:06 AM By: Donnamarie Poag Entered By: Donnamarie Poag on 10/10/2021 08:38:36 Richard Mendoza ([id number]) -------------------------------------------------------------------------------- Patient/Caregiver Education Details Patient Name: Richard Mendoza Date of Service: 10/10/2021 8:30 AM Medical Record Number: [id number] Patient Account Number: [id number] Date of Birth/Gender: 1976-07-06 (45 y.o. M) Treating RN: Donnamarie Poag Primary Care Physician: Tracie Harrier Other Clinician: Referring Physician: Tracie Harrier Treating Physician/Extender: Skipper Cliche in Treatment: 3 Education Assessment Education Provided To: Patient Education Topics Provided Basic Hygiene: Elevated Blood Sugar/ Impact on Healing: Wound/Skin Impairment: Electronic Signature(s) Signed: 10/10/2021 11:32:06 AM By: Donnamarie Poag Entered By: Donnamarie Poag on 10/10/2021 09:04:39 Banta, Legrand Como ([id number]) -------------------------------------------------------------------------------- Wound Assessment Details Patient Name: Richard Mendoza Date of Service: 10/10/2021 8:30 AM Medical Record Number: [id number] Patient Account Number: [id number] Date of Birth/Sex: May 04, 1976 (45 y.o. M) Treating RN: Donnamarie Poag Primary Care Marli Diego: Tracie Harrier Other Clinician: Referring Makiya Jeune: Tracie Harrier Treating Jaquail Mclees/Extender: Skipper Cliche in Treatment: 3 Wound Status Wound Number: 1 Primary Etiology: Pressure Ulcer Wound Location: Left, Medial Upper Leg Secondary Diabetic Wound/Ulcer of the Lower Extremity Etiology: Wounding Event: Blister Wound Status: Open Date Acquired: 08/19/2021 Notes: measurement is boxed Weeks Of Treatment: 3 Comorbid Coronary Artery Disease, Hypertension, Type II Clustered Wound: Yes History: Diabetes, Neuropathy Photos Wound Measurements Length: (cm) 2 Width: (cm) 1.5 Depth: (cm) 0.1 Area: (cm) 2.356 Volume: (cm) 0.236 % Reduction  in Area: 91.6% % Reduction in Volume: 97.2% Tunneling: No Undermining: No Wound Description Classification: Category/Stage III Exudate Amount: Medium Exudate Type: Serosanguineous Exudate Color: red, brown Foul Odor After Cleansing: No Slough/Fibrino Yes Wound  Bed Granulation Amount: Medium (34-66%) Exposed Structure Granulation Quality: Red, Pink Fascia Exposed: No Necrotic Amount: Medium (34-66%) Fat Layer (Subcutaneous Tissue) Exposed: Yes Necrotic Quality: Eschar, Adherent Slough Tendon Exposed: No Muscle Exposed: No Joint Exposed: No Bone Exposed: No Treatment Notes Wound #1 (Upper Leg) Wound Laterality: Left, Medial Cleanser Normal Saline Discharge Instruction: Wash your hands with soap and water. Remove old dressing, discard into plastic bag and place into trash. Cleanse the wound with Normal Saline prior to applying a clean dressing using gauze sponges, not tissues or cotton balls. Do not scrub or use excessive force. Pat dry using gauze sponges, not tissue or cotton balls. DONNIS, PHANEUF ([id number]) Soap and Water Discharge Instruction: Gently cleanse wound with antibacterial soap, rinse and pat dry prior to dressing wounds Peri-Wound Care Topical Primary Dressing Prisma 4.34 (in) Discharge Instruction: Moisten w/normal saline or sterile water; Cover wound as directed. Do not remove from wound bed. Secondary Dressing ABD Pad 5x9 (in/in) Discharge Instruction: Cover with ABD pad Secured With 13M [place] Surgical Tape, 2x2 (in/yd) Kerlix Roll Sterile or Non-Sterile 6-ply 4.5x4 (yd/yd) Discharge Instruction: Apply Kerlix as directed Compression Wrap Compression Stockings Add-Ons Electronic Signature(s) Signed: 10/10/2021 11:32:06 AM By: Donnamarie Poag Entered By: Donnamarie Poag on 10/10/2021 08:43:04 Viar, Legrand Como ([id number]) -------------------------------------------------------------------------------- [place] Details Patient Name: Richard Mendoza Date of Service: 10/10/2021 8:30 AM Medical Record Number: [id number] Patient Account Number: [id number] Date of Birth/Sex: May 01, 1976 (45 y.o. M) Treating RN: Donnamarie Poag Primary Care Rosalee Tolley: Tracie Harrier Other Clinician: Referring Keonna Raether: Tracie Harrier Treating Darely Becknell/Extender: Skipper Cliche in Treatment: 3 Vital Signs Time Taken: 08:36 Temperature (F): 97.9 Height (in): 70 Pulse (bpm): 92 Weight (lbs): 215 Respiratory Rate (breaths/min): 16 Body Mass Index (BMI): 30.8 Blood Pressure (mmHg): 186/113 Reference Range: 80 - 120 mg / dl Notes Stated spoke to PCP and picking up new BP meds today; denies headache-education about monitoring BP at home and take meds per prescribed Electronic Signature(s) Signed: 10/10/2021 11:32:06 AM By: Donnamarie Poag Entered ByDonnamarie Poag on 10/10/2021 08:44:33

## 2021-10-10 NOTE — Progress Notes (Addendum)
JAYE, POLIDORI ([id number]) Visit Report for 10/10/2021 Chief Complaint Document Details Patient Name: Richard Mendoza Date of Service: 10/10/2021 8:30 AM Medical Record Number: [id number] Patient Account Number: [id number] Date of Birth/Sex: [birth year] (45 y.o. 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 M) Treating RN: Hansel Feinstein Primary Care Provider: Barbette Reichmann Other Clinician: Referring Provider: Barbette Reichmann Treating Provider/Extender: Rowan Blase in Treatment: 3 History of Present Illness HPI Description: 09/19/2021 upon evaluation today patient presents for initial inspection here in our clinic concerning issues he has been having with wounds over the medial and lateral portion of his knee that were caused by pressure from his prosthesis. Unfortunately this had shrunk over time as most do and he was just before getting a new size or rather it resized when this started causing the problem in September he had first got the prosthesis in April. Nonetheless he has not been able to wear the prosthesis now on this BKA due to the fact that again he has the wounds. 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 He does  have hypertension. In general the good news is this does not appear to be infected. 09/26/2021 upon evaluation today patient appears to be doing much better in regard to his wounds. He is going to have to have some sharp debridement today but overall I am extremely pleased with where things stand at this point. It does not appear to be any evidence of active infection which is great news. 10/03/2021 upon evaluation today patient appears to be doing well with regard to his wound on the medial aspect of his left knee region. This is significantly improved even compared to last week and I am overall extremely pleased with where we stand today. There does not appear to be any sign whatsoever of active infection which is great news as well. In general I think we are headed in the appropriate direction here. 10/10/2021 upon evaluation today patient appears to be doing well with regard to his leg ulcer. He has been tolerating the dressing changes without complication. Fortunately there does not appear to be any evidence of infection which is great news. Overall I think that he is making excellent progress that the [facility] is starting to stick to some degree currently. I think that we may want switch things up a little bit here and try something different. Electronic Signature(s) Signed: 10/10/2021 9:10:09 AM By: Lenda Kelp PA-C Entered By: Lenda Kelp on 10/10/2021 09:10:09 IOKEPA, GEFFRE ([id number]) -------------------------------------------------------------------------------- Physical Exam Details Patient Name: Richard Mendoza Date of Service: 10/10/2021 8:30 AM Medical Record Number: [id number] Patient Account Number: [id number] Date of Birth/Sex: [birth year] (45 y.o. M) Treating RN: Hansel Feinstein Primary Care Provider: Barbette Reichmann Other Clinician: Referring Provider: Barbette Reichmann Treating Provider/Extender: Rowan Blase in Treatment:  3 Constitutional Well-nourished and well-hydrated in no acute distress. Respiratory normal breathing without difficulty. Psychiatric this patient is able to make decisions and demonstrates  good insight into disease process. Alert and Oriented x 3. pleasant and cooperative. Notes Upon inspection patient's wound bed showed signs of excellent granulation epithelization at this point the [facility] which is starting to stick really badly. For that reason I think it may be time to switch over to a collagen-based dressing which the patient is in agreement with as well. Electronic Signature(s) Signed: 10/10/2021 9:10:25 AM By: Lenda Kelp PA-C Entered By: Lenda Kelp on 10/10/2021 09:10:25 Richard Mendoza ([id number]) -------------------------------------------------------------------------------- Physician Orders Details Patient Name: Richard Mendoza Date of Service: 10/10/2021 8:30 AM Medical Record Number: [id number] Patient Account Number: [id number] Date of Birth/Sex: 05-27-1976 (45 y.o. M) Treating RN: Hansel Feinstein Primary Care Provider: Barbette Reichmann Other Clinician: Referring Provider: Barbette Reichmann Treating Provider/Extender: Rowan Blase in Treatment: 3 Verbal / Phone Orders: No Diagnosis Coding ICD-10 Coding Code Description E11.622 Type 2 diabetes mellitus with other skin ulcer L98.492 Non-pressure chronic ulcer of skin of other sites with fat layer exposed Z89.512 Acquired absence of left leg below knee I10 Essential (primary) hypertension Follow-up Appointments o Return Appointment in 1 week. o Nurse Visit as needed Bathing/ Shower/ Hygiene o Clean wound with Normal Saline or wound cleanser. - keep dressing dry o May shower with wound dressing protected with water repellent cover or cast protector. o No tub bath. Edema Control - Lymphedema / Segmental Compressive Device / Other o Tubigrip single layer applied. - left leg o DO YOUR  BEST to sleep in the bed at night. DO NOT sleep in your recliner. Long hours of sitting in a recliner leads to swelling of the legs and/or potential wounds on your backside. Additional Orders / Instructions o Follow Nutritious Diet and Increase Protein Intake o Other: - continue to check blood sugar Medications-Please add to medication list. o Take one 500mg  Tylenol (Acetaminophen) and one 200mg  Motrin (Ibuprofen) every 6 hours for pain. Do not take ibuprofen if you are on blood thinners or have stomach ulcers. o Other: - take BP meds and monitor daily-contact your prescriber if BP con't to be elevated with taking meds as prescribed- Wound Treatment Wound #1 - Upper Leg Wound Laterality: Left, Medial Cleanser: Normal Saline Every Other Day/15 Days Discharge Instructions: Wash your hands with soap and water. Remove old dressing, discard into plastic bag and place into trash. Cleanse the wound with Normal Saline prior to applying a clean dressing using gauze sponges, not tissues or cotton balls. Do not scrub or use excessive force. Pat dry using gauze sponges, not tissue or cotton balls. Cleanser: Soap and Water Every Other Day/15 Days Discharge Instructions: Gently cleanse wound with antibacterial soap, rinse and pat dry prior to dressing wounds Primary Dressing: Prisma 4.34 (in) Every Other Day/15 Days Discharge Instructions: Moisten w/normal saline or sterile water; Cover wound as directed. Do not remove from wound bed. Secondary Dressing: ABD Pad 5x9 (in/in) Every Other Day/15 Days Discharge Instructions: Cover with ABD pad Secured With: 86M Medipore H Soft Cloth Surgical Tape, 2x2 (in/yd) Every Other Day/15 Days Secured With: Kerlix Roll Sterile or Non-Sterile 6-ply 4.5x4 (yd/yd) Every Other Day/15 Days Discharge Instructions: Apply Kerlix as directed Electronic Signature(s) BRANNEN, KOPPEN ( ) Signed: 10/10/2021 11:32:06 AM By: [id number] Signed: 10/10/2021 5:02:50 PM  By: Hansel Feinstein PA-C Entered By: 10/12/2021 on 10/10/2021 09:06:29 Hansel Feinstein (10/12/2021) -------------------------------------------------------------------------------- Problem List Details Patient Name: Richard Mendoza Date of Service: 10/10/2021 8:30 AM Medical Record Number: Richard Mendoza Patient Account Number: 10/12/2021 Date of Birth/Sex: 22-Jul-1976 (45 y.o. M) Treating  RN: Hansel Feinstein Primary Care Provider: Barbette Reichmann Other Clinician: Referring Provider: Barbette Reichmann Treating Provider/Extender: Rowan Blase in Treatment: 3 Active Problems ICD-10 Encounter Code Description Active Date MDM Diagnosis E11.622 Type 2 diabetes mellitus with other skin ulcer 09/19/2021 No Yes L98.492 Non-pressure chronic ulcer of skin of other sites with fat layer exposed 09/19/2021 No Yes Z89.512 Acquired absence of left leg below knee 09/19/2021 No Yes I10 Essential (primary) hypertension 09/19/2021 No Yes Inactive Problems Resolved Problems Electronic Signature(s) Signed: 10/10/2021 9:03:01 AM By: Lenda Kelp PA-C Entered By: Lenda Kelp on 10/10/2021 09:03:01 Richard Mendoza ([id number]) -------------------------------------------------------------------------------- Progress Note Details Patient Name: Richard Mendoza Date of Service: 10/10/2021 8:30 AM Medical Record Number: [id number] Patient Account Number: [id number] Date of Birth/Sex: 10/23/1976 (45 y.o. 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 Unfortunately this had shrunk over time as most do and he was just before getting a new size or rather it resized when this started causing the problem in September he had first got the prosthesis in April. Nonetheless he has not been able to wear the prosthesis now on this BKA due to the fact that again he has the wounds. 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 He does have hypertension. In general the good news is this does not appear to be infected. 09/26/2021 upon evaluation today patient appears to be doing much better in regard to his wounds. He is going to have to have some sharp debridement today but overall I am extremely pleased with where things stand at this point. It does not appear to be any evidence of active infection which is great news. 10/03/2021 upon evaluation today patient appears to be doing well with regard to his wound on the medial aspect of his left knee region. This is significantly improved even compared to last week and I am overall extremely pleased with where we stand today. There does not appear to be any sign whatsoever of active infection which is great news as well. In general I think we are headed in the appropriate direction here. 10/10/2021 upon evaluation today patient appears to be doing well with regard to his leg ulcer. He has been tolerating the dressing changes without complication. Fortunately there does not appear to be any evidence of infection which is great news. Overall I think that he is making excellent progress that the [facility] is starting to stick to some degree currently. I think that we may want switch things up a little bit here and try something different. Objective Constitutional Well-nourished and well-hydrated in no acute distress. Vitals Time Taken: 8:36 AM, Height: 70 in, Weight:  215 lbs, BMI: 30.8, Temperature: 97.9 F, Pulse: 92 bpm, Respiratory Rate: 16 breaths/min, Blood Pressure: 186/113 mmHg. General Notes: Stated spoke to PCP and picking up new BP meds today; denies headache-education about monitoring BP at home and take meds per prescribed Respiratory normal breathing without difficulty. Psychiatric this patient is able to make  decisions and demonstrates good insight into disease process. Alert and Oriented x 3. pleasant and cooperative. General Notes: Upon inspection patient's wound bed showed signs of excellent granulation epithelization at this point the [facility] which is starting to stick really badly. For that reason I think it may be time to switch over to a collagen-based dressing which the patient is in agreement with as well. Integumentary (Hair, Skin) Wound #1 status is Open. Original cause of wound was Blister. The date acquired was: 08/19/2021. The wound has been in treatment 3 weeks. The wound is located on the Left,Medial Upper Leg. The wound measures 2cm length x 1.5cm width x 0.1cm depth; 2.356cm^2 area and 0.236cm^3 volume. There is Fat Layer (Subcutaneous Tissue) exposed. There is no tunneling or undermining noted. There is a medium amount of serosanguineous drainage noted. There is medium (34-66%) red, pink granulation within the wound bed. There is a medium (34-66%) amount of necrotic tissue within the wound bed including Eschar and Adherent Slough. 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 Nurse Visit as needed Bathing/ Shower/ Hygiene: Clean wound with Normal Saline or wound cleanser. - keep dressing dry May shower with wound dressing protected with water  repellent cover or cast protector. No tub bath. Edema Control - Lymphedema / Segmental Compressive Device / Other: Tubigrip single layer applied. - left leg DO YOUR BEST to sleep in the bed at night. DO NOT sleep in your recliner. Long hours of sitting in a recliner leads to swelling of the legs and/or potential wounds on your backside. Additional Orders / Instructions: Follow Nutritious Diet and Increase Protein Intake Other: - continue to check blood sugar Medications-Please add to medication list.: Take one  Tylenol (Acetaminophen) and one  Motrin (Ibuprofen) every 6 hours for pain. Do not take ibuprofen if you are on blood thinners or have stomach ulcers. Other: - take BP meds and monitor daily-contact your prescriber if BP con't to be elevated with taking meds as prescribed- WOUND #1: - Upper Leg Wound Laterality: Left, Medial Cleanser: Normal Saline Every Other Day/15 Days Discharge Instructions: Wash your hands with soap and water. Remove old dressing, discard into plastic bag and place into trash. Cleanse the wound with Normal Saline prior to applying a clean dressing using gauze sponges, not tissues or cotton balls. Do not scrub or use excessive force. Pat dry using gauze sponges, not tissue or cotton balls. Cleanser: Soap and Water Every Other Day/15 Days Discharge Instructions: Gently cleanse wound with antibacterial soap, rinse and pat dry prior to dressing wounds Primary Dressing: Prisma 4.34 (in) Every Other Day/15 Days Discharge Instructions: Moisten w/normal saline or sterile water; Cover wound as directed. Do not remove from wound bed. Secondary Dressing: ABD Pad 5x9 (in/in) Every Other Day/15 Days Discharge Instructions: Cover with ABD pad Secured With: 47M Medipore H Soft Cloth Surgical Tape, 2x2 (in/yd) Every Other Day/15 Days Secured With: Kerlix Roll Sterile or Non-Sterile 6-ply 4.5x4 (yd/yd) Every Other Day/15 Days Discharge Instructions: Apply Kerlix as  directed 1. Would recommend currently that going continue with the wound care measures as before and the patient is in agreement with the plan. 2. I am also can recommend that we have the patient continue with the silver collagen dressing which I think is probably the best  option going forward. We just need to make sure she just in the tucked under area as well as the open region externally which I showed him as well we just do not want it sticking in general over the wound bed. We will see patient back for reevaluation in 1 week here in the clinic. If anything worsens or changes patient will contact our office for additional recommendations. Electronic Signature(s) Signed: 10/10/2021 9:11:04 AM By: Lenda Kelp PA-C Entered By: Lenda Kelp on 10/10/2021 09:11:04 Richard Mendoza ([id number]) -------------------------------------------------------------------------------- SuperBill Details Patient Name: Richard Mendoza Date of Service: 10/10/2021 Medical Record Number: [id number] Patient Account Number: [id number] Date of Birth/Sex: [birth year] (45 y.o. M) Treating RN: Hansel Feinstein Primary Care Provider: Barbette Reichmann Other Clinician: Referring Provider: Barbette Reichmann Treating Provider/Extender: Rowan Blase in Treatment: 3 Diagnosis Coding ICD-10 Codes Code Description E11.622 Type 2 diabetes mellitus with other skin ulcer L98.492 Non-pressure chronic ulcer of skin of other sites with fat layer exposed Z89.512 Acquired absence of left leg below knee I10 Essential (primary) hypertension Facility Procedures CPT4 Code: [id number] Description: [phone] - WOUND CARE VISIT-LEV 2 EST PT Modifier: Quantity: 1 Physician Procedures CPT4 Code: [id number] Description: 99213 - WC PHYS LEVEL 3 - EST PT Modifier: Quantity: 1 CPT4 Code: Description: ICD-10 Diagnosis Description E11.622 Type 2 diabetes mellitus with other skin ulcer L98.492 Non-pressure chronic ulcer of skin of other  sites with fat layer expos Z89.512 Acquired absence of left leg below knee I10 Essential (primary)  hypertension Modifier: ed Quantity: Electronic Signature(s) Signed: 10/10/2021 9:11:22 AM By: Lenda Kelp PA-C Entered By: Lenda Kelp on 10/10/2021 09:11:22

## 2021-10-17 ENCOUNTER — Other Ambulatory Visit: Payer: Self-pay

## 2021-10-17 ENCOUNTER — Encounter: Payer: Medicaid Other | Admitting: Physician Assistant

## 2021-10-17 DIAGNOSIS — E11622 Type 2 diabetes mellitus with other skin ulcer: Secondary | ICD-10-CM | POA: Diagnosis not present

## 2021-10-17 NOTE — Progress Notes (Addendum)
Richard Mendoza ([id number]) Visit Report for 10/17/2021 Arrival Information Details Patient Name: Richard Mendoza Date of Service: 10/17/2021 8:30 AM Medical Record Number: [id number] Patient Account Number: [id number] Date of Birth/Sex: January 24, 1976 (45 y.o. M) Treating RN: Donnamarie Poag Primary Care Lorynn Moeser: Tracie Harrier Other Clinician: Referring King Pinzon: Tracie Harrier Treating [name]/Extender: Skipper Cliche in Treatment: 4 Visit Information History Since Last Visit Added or deleted any medications: No Patient Arrived: Ambulatory Had a fall or experienced change in No Arrival Time: 08:41 activities of daily living that may affect Accompanied By: self risk of falls: Transfer Assistance: None Hospitalized since last visit: No Patient Identification Verified: Yes Has Dressing in Place as Prescribed: Yes Secondary Verification Process Completed: Yes Pain Present Now: No Patient Requires Transmission-Based No Precautions: Patient Has Alerts: Yes Patient Alerts: DIABETIC/insulin pump Left BKA AVVS ABI R 1.31 -7/22 Electronic Signature(s) Signed: 10/17/2021 2:55:55 PM By: Donnamarie Poag Entered By: Donnamarie Poag on 10/17/2021 08:44:21 Wiseman, Richard Mendoza ([id number]) -------------------------------------------------------------------------------- Clinic Level of Care Assessment Details Patient Name: Richard Mendoza Date of Service: 10/17/2021 8:30 AM Medical Record Number: [id number] Patient Account Number: [id number] Date of Birth/Sex: 07-Jan-1976 (45 y.o. M) Treating RN: Donnamarie Poag Primary Care Bostyn Kunkler: Tracie Harrier Other Clinician: Referring Kaylany Tesoriero: Tracie Harrier Treating Fabion Gatson/Extender: Skipper Cliche in Treatment: 4 Clinic Level of Care Assessment Items TOOL 4 Quantity Score _0  - Use when only an EandM is performed on FOLLOW-UP visit 0 ASSESSMENTS - Nursing Assessment / Reassessment _1  - Reassessment of Co-morbidities (includes updates in patient  status) 0 _2  - 0 Reassessment of Adherence to Treatment Plan ASSESSMENTS - Wound and Skin Assessment / Reassessment X - Simple Wound Assessment / Reassessment - one wound 1 5 _3  - 0 Complex Wound Assessment / Reassessment - multiple wounds _4  - 0 Dermatologic / Skin Assessment (not related to wound area) ASSESSMENTS - Focused Assessment _5  - Circumferential Edema Measurements - multi extremities 0 _6  - 0 Nutritional Assessment / Counseling / Intervention _7  - 0 Lower Extremity Assessment (monofilament, tuning fork, pulses) _8  - 0 Peripheral Arterial Disease Assessment (using hand held doppler) ASSESSMENTS - Ostomy and/or Continence Assessment and Care _9  - Incontinence Assessment and Management 0 _10  - 0 Ostomy Care Assessment and Management (repouching, etc.) PROCESS - Coordination of Care X - Simple Patient / Family Education for ongoing care 1 15 _11  - 0 Complex (extensive) Patient / Family Education for ongoing care _12  - 0 Staff obtains Programmer, systems, Records, Test Results / Process Orders _13  - 0 Staff telephones HHA, Nursing Homes / Clarify orders / etc _14  - 0 Routine Transfer to another Facility (non-emergent condition) _15  - 0 Routine Hospital Admission (non-emergent condition) _16  - 0 New Admissions / Biomedical engineer / Ordering NPWT, Apligraf, etc. _17  - 0 Emergency Hospital Admission (emergent condition) X- 1 10 Simple Discharge Coordination _18  - 0 Complex (extensive) Discharge Coordination PROCESS - Special Needs _19  - Pediatric / Minor Patient Management 0 _20  - 0 Isolation Patient Management _21  - 0 Hearing / Language / Visual special needs _22  - 0 Assessment of Community assistance (transportation, D/C planning, etc.) _23  - 0 Additional assistance / Altered mentation _24  - 0 Support Surface(s) Assessment (bed, cushion, seat, etc.) INTERVENTIONS - Wound Cleansing / Measurement Richard Mendoza ([id number]) X- 1 5 Simple Wound Cleansing - one wound _25  -  0 Complex Wound Cleansing - multiple wounds X- 1 5 Wound Imaging (photographs - any number of wounds) _26  - 0 Wound Tracing (instead of photographs) X- 1 5 Simple Wound Measurement - one wound _27  -  0 Complex Wound Measurement - multiple wounds INTERVENTIONS - Wound Dressings X - Small Wound Dressing one or multiple wounds 1 10 _0  - 0 Medium Wound Dressing one or multiple wounds _1  - 0 Large Wound Dressing one or multiple wounds X- 1 5 Application of Medications - topical <QBHALPFXTKWIOXBD>_5<\/[id number]>_2  - 0 Application of Medications - injection INTERVENTIONS - Miscellaneous _3  - External ear exam 0 _4  - 0 Specimen Collection (cultures, biopsies, blood, body fluids, etc.) _5  - 0 Specimen(s) / Culture(s) sent or taken to Lab for analysis _6  - 0 Patient Transfer (multiple staff / Harrel Lemon Lift / Similar devices) _7  - 0 Simple Staple / Suture removal (25 or less) _8  - 0 Complex Staple / Suture removal (26 or more) _9  - 0 Hypo / Hyperglycemic Management (close monitor of Blood Glucose) _10  - 0 Ankle / Brachial Index (ABI) - do not check if billed separately X- 1 5 Vital Signs Has the patient been seen at the hospital within the last three years: Yes Total Score: 65 Level Of Care: New/Established - Level 2 Electronic Signature(s) Signed: 10/17/2021 2:55:55 PM By: Donnamarie Poag Entered By: Donnamarie Poag on 10/17/2021 09:02:11 Richard Mendoza ([id number]) -------------------------------------------------------------------------------- Encounter Discharge Information Details Patient Name: Richard Mendoza Date of Service: 10/17/2021 8:30 AM Medical Record Number: [id number] Patient Account Number: [id number] Date of Birth/Sex: 02/24/1976 (45 y.o. M) Treating RN: Donnamarie Poag Primary Care Darryll Raju: Tracie Harrier Other Clinician: Referring Jaydeen Odor: Tracie Harrier Treating Elisea Khader/Extender: Skipper Cliche in Treatment: 4 Encounter Discharge Information Items Discharge Condition: Stable Ambulatory  Status: Ambulatory Discharge Destination: Home Transportation: Private Auto Accompanied By: self Schedule Follow-up Appointment: Yes Clinical Summary of Care: Electronic Signature(s) Signed: 10/17/2021 2:55:55 PM By: Donnamarie Poag Entered By: Donnamarie Poag on 10/17/2021 09:12:37 Richard Mendoza ([id number]) -------------------------------------------------------------------------------- Lower Extremity Assessment Details Patient Name: Richard Mendoza Date of Service: 10/17/2021 8:30 AM Medical Record Number: [id number] Patient Account Number: [id number] Date of Birth/Sex: 1976-05-17 (45 y.o. M) Treating RN: Donnamarie Poag Primary Care Jamarria Real: Tracie Harrier Other Clinician: Referring Arcangel Minion: Tracie Harrier Treating Kiril Hippe/Extender: Jeri Cos Weeks in Treatment: 4 Edema Assessment Assessed: [Left: Yes] [Right: No] Edema: [Left: N] [Right: o] Electronic Signature(s) Signed: 10/17/2021 2:55:55 PM By: Donnamarie Poag Entered By: Donnamarie Poag on 10/17/2021 08:52:10 Lurry, Richard Mendoza ([id number]) -------------------------------------------------------------------------------- Multi Wound Chart Details Patient Name: Richard Mendoza Date of Service: 10/17/2021 8:30 AM Medical Record Number: [id number] Patient Account Number: [id number] Date of Birth/Sex: Apr 29, 1976 (45 y.o. M) Treating RN: Donnamarie Poag Primary Care Brin Ruggerio: Tracie Harrier Other Clinician: Referring Ahlam Piscitelli: Tracie Harrier Treating Tattiana Fakhouri/Extender: Skipper Cliche in Treatment: 4 Vital Signs Height(in): 70 Pulse(bpm): 68 Weight(lbs): 215 Blood Pressure(mmHg): 158/95 Body Mass Index(BMI): 31 Temperature(F): 97.8 Respiratory Rate(breaths/min): 16 Photos: [N/A:N/A] Wound Location: Left, Medial Upper Leg N/A N/A Wounding Event: Blister N/A N/A Primary Etiology: Pressure Ulcer N/A N/A Secondary Etiology: Diabetic Wound/Ulcer of the Lower N/A N/A Extremity Comorbid History: Coronary Artery Disease, N/A  N/A Hypertension, Type II Diabetes, Neuropathy Date Acquired: 08/19/2021 N/A N/A Weeks of Treatment: 4 N/A N/A Wound Status: Open N/A N/A Clustered Wound: Yes N/A N/A Measurements L x W x D (cm) 0.6x0.6x0.1 N/A N/A Area (cm) : 0.283 N/A N/A Volume (cm) : 0.028 N/A N/A % Reduction in Area: 99.00% N/A N/A % Reduction in Volume: 99.70% N/A N/A Classification: Category/Stage III N/A N/A Exudate Amount: Medium N/A N/A Exudate Type: Serosanguineous N/A N/A Exudate Color: red, brown N/A N/A Granulation Amount: None Present (0%) N/A N/A Necrotic Amount: Large (67-100%) N/A N/A Necrotic Tissue: Eschar, Adherent Slough N/A N/A  Exposed Structures: Fat Layer (Subcutaneous Tissue): N/A N/A Yes Fascia: No Tendon: No Muscle: No Joint: No Bone: No Treatment Notes Electronic Signature(s) Signed: 10/17/2021 2:55:55 PM By: Donnamarie Poag Entered By: Donnamarie Poag on 10/17/2021 08:55:02 Richard Mendoza ([id number]) -------------------------------------------------------------------------------- Multi-Disciplinary Care Plan Details Patient Name: Richard Mendoza Date of Service: 10/17/2021 8:30 AM Medical Record Number: [id number] Patient Account Number: [id number] Date of Birth/Sex: 01/17/1976 (45 y.o. M) Treating RN: Donnamarie Poag Primary Care Jocelynne Duquette: Tracie Harrier Other Clinician: Referring Fatema Rabe: Tracie Harrier Treating Gal Smolinski/Extender: Skipper Cliche in Treatment: 4 Active Inactive Wound/Skin Impairment Nursing Diagnoses: Impaired tissue integrity Knowledge deficit related to smoking impact on wound healing Knowledge deficit related to ulceration/compromised skin integrity Goals: Patient/caregiver will verbalize understanding of skin care regimen Date Initiated: 09/19/2021 Date Inactivated: 09/26/2021 Target Resolution Date: 10/08/2021 Goal Status: Met Ulcer/skin breakdown will have a volume reduction of 30% by week 4 Date Initiated: 09/19/2021 Date Inactivated:  10/10/2021 Target Resolution Date: 10/17/2021 Goal Status: Met Ulcer/skin breakdown will have a volume reduction of 50% by week 8 Date Initiated: 09/19/2021 Date Inactivated: 10/17/2021 Target Resolution Date: 11/14/2021 Goal Status: Met Ulcer/skin breakdown will have a volume reduction of 80% by week 12 Date Initiated: 09/19/2021 Target Resolution Date: 12/12/2021 Goal Status: Active Ulcer/skin breakdown will heal within 14 weeks Date Initiated: 09/19/2021 Target Resolution Date: 12/30/2021 Goal Status: Active Interventions: Assess patient/caregiver ability to obtain necessary supplies Assess patient/caregiver ability to perform ulcer/skin care regimen upon admission and as needed Assess ulceration(s) every visit Notes: Electronic Signature(s) Signed: 10/17/2021 2:55:55 PM By: Donnamarie Poag Entered By: Donnamarie Poag on 10/17/2021 08:54:15 Att[place], Richard Mendoza ([id number]) -------------------------------------------------------------------------------- Pain Assessment Details Patient Name: Richard Mendoza Date of Service: 10/17/2021 8:30 AM Medical Record Number: [id number] Patient Account Number: [id number] Date of Birth/Sex: 1976/01/26 (45 y.o. M) Treating RN: Donnamarie Poag Primary Care Kanasia Gayman: Tracie Harrier Other Clinician: Referring Tyquarius Paglia: Tracie Harrier Treating Zayn Selley/Extender: Skipper Cliche in Treatment: 4 Active Problems Location of Pain Severity and Description of Pain Patient Has Paino No Site Locations Rate the pain. Current Pain Level: 0 Pain Management and Medication Current Pain Management: Notes none at this time Electronic Signature(s) Signed: 10/17/2021 2:55:55 PM By: Donnamarie Poag Entered By: Donnamarie Poag on 10/17/2021 08:46:19 Richard Mendoza ([id number]) -------------------------------------------------------------------------------- Patient/Caregiver Education Details Patient Name: Richard Mendoza Date of Service: 10/17/2021 8:30 AM Medical Record  Number: [id number] Patient Account Number: [id number] Date of Birth/Gender: 1976-05-10 (45 y.o. M) Treating RN: Donnamarie Poag Primary Care Physician: Tracie Harrier Other Clinician: Referring Physician: Tracie Harrier Treating Physician/Extender: Skipper Cliche in Treatment: 4 Education Assessment Education Provided To: Patient Education Topics Provided Basic Hygiene: Wound/Skin Impairment: Electronic Signature(s) Signed: 10/17/2021 2:55:55 PM By: Donnamarie Poag Entered By: Donnamarie Poag on 10/17/2021 08:58:11 Salaam, Richard Mendoza ([id number]) -------------------------------------------------------------------------------- Wound Assessment Details Patient Name: Richard Mendoza Date of Service: 10/17/2021 8:30 AM Medical Record Number: [id number] Patient Account Number: [id number] Date of Birth/Sex: Jul 22, 1976 (45 y.o. M) Treating RN: Donnamarie Poag Primary Care Cristiano Capri: Tracie Harrier Other Clinician: Referring Cassandr Cederberg: Tracie Harrier Treating [place] Fant/Extender: Skipper Cliche in Treatment: 4 Wound Status Wound Number: 1 Primary Etiology: Pressure Ulcer Wound Location: Left, Medial Upper Leg Secondary Diabetic Wound/Ulcer of the Lower Extremity Etiology: Wounding Event: Blister Wound Status: Open Date Acquired: 08/19/2021 Notes: measurement is boxed Weeks Of Treatment: 4 Comorbid Coronary Artery Disease, Hypertension, Type II Clustered Wound: Yes History: Diabetes, Neuropathy Photos Wound Measurements Length: (cm) 0.6 % Redu Width: (cm) 0.3 % Redu Depth: (cm) 0.3 Tunnel Area: (cm) 0.141 Under Volume: (cm) 0.042 Sta Endi Maxi ction in  Area: 99.5% ction in Volume: 99.5% ing: No mining: Yes rting Position (o'clock): 12 ng Position (o'clock): 4 mum Distance: (cm) 0.2 Wound Description Classification: Category/Stage III Foul O Exudate Amount: Medium Slough Exudate Type: Serosanguineous Exudate Color: red, brown dor After Cleansing: No /Fibrino Yes Wound  Bed Granulation Amount: None Present (0%) Exposed Structure Necrotic Amount: Large (67-100%) Fascia Exposed: No Necrotic Quality: Eschar, Adherent Slough Fat Layer (Subcutaneous Tissue) Exposed: Yes Tendon Exposed: No Muscle Exposed: No Joint Exposed: No Bone Exposed: No Treatment Notes Wound #1 (Upper Leg) Wound Laterality: Left, Medial Cleanser Normal Saline [place], Mannix ([id number]) Discharge Instruction: Wash your hands with soap and water. Remove old dressing, discard into plastic bag and place into trash. Cleanse the wound with Normal Saline prior to applying a clean dressing using gauze sponges, not tissues or cotton balls. Do not scrub or use excessive force. Pat dry using gauze sponges, not tissue or cotton balls. Soap and Water Discharge Instruction: Gently cleanse wound with antibacterial soap, rinse and pat dry prior to dressing wounds Peri-Wound Care Topical Primary Dressing Prisma 4.34 (in) Discharge Instruction: Moisten w/normal saline or sterile water; Cover wound as directed. Do not remove from wound bed. Secondary Dressing ABD Pad 5x9 (in/in) Discharge Instruction: Cover with ABD pad Secured With 52M Medipore H Soft Cloth Surgical Tape, 2x2 (in/yd) Kerlix Roll Sterile or Non-Sterile 6-ply 4.5x4 (yd/yd) Discharge Instruction: Apply Kerlix as directed Tubigrip Size E, 3.5x10 (in/yds) Discharge Instruction: Apply 3 Tubigrip E 3-finger-widths below knee to base of toes to secure dressing and/or for swelling. Compression Wrap Compression Stockings Add-Ons Electronic Signature(s) Signed: 10/17/2021 2:55:55 PM By: Donnamarie Poag Entered By: Donnamarie Poag on 10/17/2021 09:07:08 Richard Mendoza ([id number]) -------------------------------------------------------------------------------- Vitals Details Patient Name: Richard Mendoza Date of Service: 10/17/2021 8:30 AM Medical Record Number: [id number] Patient Account Number: [id number] Date of Birth/Sex: 1976-03-15 (45  y.o. M) Treating RN: Donnamarie Poag Primary Care Marcie Shearon: Tracie Harrier Other Clinician: Referring Lakynn Halvorsen: Tracie Harrier Treating Faryn Sieg/Extender: Skipper Cliche in Treatment: 4 Vital Signs Time Taken: 08:43 Temperature (F): 97.8 Height (in): 70 Pulse (bpm): 82 Weight (lbs): 215 Respiratory Rate (breaths/min): 16 Body Mass Index (BMI): 30.8 Blood Pressure (mmHg): 158/95 Reference Range: 80 - 120 mg / dl Electronic Signature(s) Signed: 10/17/2021 2:55:55 PM By: Donnamarie Poag Entered ByDonnamarie Poag on 10/17/2021 08:45:51

## 2021-10-17 NOTE — Progress Notes (Addendum)
Richard Mendoza, Richard Mendoza ([id number]) Visit Report for 10/17/2021 Chief Complaint Document Details Patient Name: Richard Mendoza, Richard Mendoza Date of Service: 10/17/2021 8:30 AM Medical Record Number: [id number] Patient Account Number: [id number] Date of Birth/Sex: [birth year] (45 y.o. 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 M) Treating RN: Hansel Feinstein Primary Care Provider: Barbette Reichmann Other Clinician: Referring Provider: Barbette Reichmann Treating Provider/Extender: Rowan Blase in Treatment: 4 History of Present Illness HPI Description: 09/19/2021 upon evaluation today patient presents for initial inspection here in our clinic concerning issues he has been having with wounds over the medial and lateral portion of his knee that were caused by pressure from his prosthesis. Unfortunately this had shrunk over time as most do and he was just before getting a new size or rather it resized when this started causing the problem in September he had first got the prosthesis in April. Nonetheless he has not been able to wear the prosthesis now on this BKA due to the fact that again he has the wounds. 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 He does  have hypertension. In general the good news is this does not appear to be infected. 09/26/2021 upon evaluation today patient appears to be doing much better in regard to his wounds. He is going to have to have some sharp debridement today but overall I am extremely pleased with where things stand at this point. It does not appear to be any evidence of active infection which is great news. 10/03/2021 upon evaluation today patient appears to be doing well with regard to his wound on the medial aspect of his left knee region. This is significantly improved even compared to last week and I am overall extremely pleased with where we stand today. There does not appear to be any sign whatsoever of active infection which is great news as well. In general I think we are headed in the appropriate direction here. 10/10/2021 upon evaluation today patient appears to be doing well with regard to his leg ulcer. He has been tolerating the dressing changes without complication. Fortunately there does not appear to be any evidence of infection which is great news. Overall I think that he is making excellent progress that the [facility] is starting to stick to some degree currently. I think that we may want switch things up a little bit here and try something different. 10/17/2021 upon evaluation today patient appears to be making progress. Still we have not gotten this completely closed but the wound is significantly smaller compared to what it was last week. I do believe the collagen is doing a good job. Electronic Signature(s) Signed: 10/17/2021 9:13:57 AM By: Lenda Kelp PA-C Entered By: Lenda Kelp on 10/17/2021 09:13:57 Richard Mendoza, Richard Mendoza ([id number]) -------------------------------------------------------------------------------- Physical Exam Details Patient Name: Richard Mendoza Date of Service: 10/17/2021 8:30 AM Medical Record Number: [id number] Patient Account Number: [id number] Date of  Birth/Sex: Apr 18, 1976 (45 y.o. M) Treating RN: Hansel Feinstein Primary  Care Provider: Barbette Reichmann Other Clinician: Referring Provider: Barbette Reichmann Treating Provider/Extender: Rowan Blase in Treatment: 4 Constitutional Well-nourished and well-hydrated in no acute distress. Respiratory normal breathing without difficulty. Psychiatric this patient is able to make decisions and demonstrates good insight into disease process. Alert and Oriented x 3. pleasant and cooperative. Notes Patient's wound bed showed signs of good granulation and epithelization at this point. Fortunately I do not see any evidence of active infection systemically which is great news. Electronic Signature(s) Signed: 10/17/2021 9:14:13 AM By: Lenda Kelp PA-C Entered By: Lenda Kelp on 10/17/2021 09:14:13 Richard Mendoza ([id number]) -------------------------------------------------------------------------------- Physician Orders Details Patient Name: Richard Mendoza Date of Service: 10/17/2021 8:30 AM Medical Record Number: [id number] Patient Account Number: [id number] Date of Birth/Sex: 01/11/1976 (45 y.o. M) Treating RN: Hansel Feinstein Primary Care Provider: Barbette Reichmann Other Clinician: Referring Provider: Barbette Reichmann Treating Provider/Extender: Rowan Blase in Treatment: 4 Verbal / Phone Orders: No Diagnosis Coding ICD-10 Coding Code Description E11.622 Type 2 diabetes mellitus with other skin ulcer L98.492 Non-pressure chronic ulcer of skin of other sites with fat layer exposed Z89.512 Acquired absence of left leg below knee I10 Essential (primary) hypertension Follow-up Appointments o Return Appointment in 1 week. o Nurse Visit as needed Bathing/ Shower/ Hygiene o Clean wound with Normal Saline or wound cleanser. - keep dressing dry o May shower with wound dressing protected with water repellent cover or cast protector. o No tub bath. Anesthetic (Use 'Patient  Medications' Section for Anesthetic Order Entry) o Lidocaine applied to wound bed Edema Control - Lymphedema / Segmental Compressive Device / Other o Tubigrip single layer applied. - left leg E size o DO YOUR BEST to sleep in the bed at night. DO NOT sleep in your recliner. Long hours of sitting in a recliner leads to swelling of the legs and/or potential wounds on your backside. Additional Orders / Instructions o Follow Nutritious Diet and Increase Protein Intake o Other: - continue to check blood sugar Medications-Please add to medication list. o Take one 500mg  Tylenol (Acetaminophen) and one 200mg  Motrin (Ibuprofen) every 6 hours for pain. Do not take ibuprofen if you are on blood thinners or have stomach ulcers. o Other: - take BP meds and monitor daily-contact your prescriber if BP con't to be elevated with taking meds as prescribed- Wound Treatment Wound #1 - Upper Leg Wound Laterality: Left, Medial Cleanser: Normal Saline Every Other Day/15 Days Discharge Instructions: Wash your hands with soap and water. Remove old dressing, discard into plastic bag and place into trash. Cleanse the wound with Normal Saline prior to applying a clean dressing using gauze sponges, not tissues or cotton balls. Do not scrub or use excessive force. Pat dry using gauze sponges, not tissue or cotton balls. Cleanser: Soap and Water Every Other Day/15 Days Discharge Instructions: Gently cleanse wound with antibacterial soap, rinse and pat dry prior to dressing wounds Primary Dressing: Prisma 4.34 (in) Every Other Day/15 Days Discharge Instructions: Moisten w/normal saline or sterile water; Cover wound as directed. Do not remove from wound bed. Secondary Dressing: ABD Pad 5x9 (in/in) Every Other Day/15 Days Discharge Instructions: Cover with ABD pad Secured With: 88M Medipore H Soft Cloth Surgical Tape, 2x2 (in/yd) Every Other Day/15 Days Secured With: Kerlix Roll Sterile or Non-Sterile 6-ply  4.5x4 (yd/yd) Every Other Day/15 Days Discharge Instructions: Apply Kerlix as directed Secured With: Tubigrip Size E, 3.5x10 (in/yds) Every Other Day/15 Days Mold[place], Richard Mendoza ( ) Discharge Instructions: Apply 3 Tubigrip E 3-finger-widths below knee to  base of toes to secure dressing and/or for swelling. Electronic Signature(s) Signed: 10/17/2021 2:55:55 PM By: Hansel Feinstein Signed: 10/17/2021 4:57:43 PM By: Lenda Kelp PA-C Entered By: Hansel Feinstein on 10/17/2021 09:15:07 Richard Mendoza ([id number]) -------------------------------------------------------------------------------- Problem List Details Patient Name: Richard Mendoza Date of Service: 10/17/2021 8:30 AM Medical Record Number: [id number] Patient Account Number: [id number] Date of Birth/Sex: 02-21-1976 (45 y.o. M) Treating RN: Hansel Feinstein Primary Care Provider: Barbette Reichmann Other Clinician: Referring Provider: Barbette Reichmann Treating Provider/Extender: Rowan Blase in Treatment: 4 Active Problems ICD-10 Encounter Code Description Active Date MDM Diagnosis E11.622 Type 2 diabetes mellitus with other skin ulcer 09/19/2021 No Yes L98.492 Non-pressure chronic ulcer of skin of other sites with fat layer exposed 09/19/2021 No Yes Z89.512 Acquired absence of left leg below knee 09/19/2021 No Yes I10 Essential (primary) hypertension 09/19/2021 No Yes Inactive Problems Resolved Problems Electronic Signature(s) Signed: 10/17/2021 8:43:23 AM By: Lenda Kelp PA-C Entered By: Lenda Kelp on 10/17/2021 08:43:23 Richard Mendoza, Richard Mendoza ([id number]) -------------------------------------------------------------------------------- Progress Note Details Patient Name: Richard Mendoza Date of Service: 10/17/2021 8:30 AM Medical Record Number: [id number] Patient Account Number: [id number] Date of Birth/Sex: 12/23/1975 (45 y.o. 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 Unfortunately this had shrunk over time as most do and he was just before getting a new size or rather it resized when this started causing the problem in September he had first got the prosthesis in April. Nonetheless he has not been able to wear the prosthesis now on this BKA due to the fact that again he has the wounds. 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 He does have hypertension. In general the good news is this does not appear to be infected. 09/26/2021 upon evaluation today patient appears to be doing much better in regard to his wounds. He is going to have to have some sharp debridement today but overall I am extremely pleased with where things stand at this point. It does not appear to be any evidence of active infection which is great news. 10/03/2021 upon evaluation today patient appears to be doing well with regard to his wound on the medial aspect of his left knee region. This is significantly improved even compared to last week and I am overall extremely pleased with where we stand today. There does not appear to be any sign whatsoever of active infection which is great news as well. In general I think we are headed in the appropriate direction here. 10/10/2021 upon evaluation today patient appears to be doing well with regard to his leg ulcer. He has been tolerating the dressing changes without complication. Fortunately  there does not appear to be any evidence of infection which is great news. Overall I think that he is making excellent progress that the [facility] is starting to stick to some degree currently. I think that we may want switch things up a little bit here and try something different. 10/17/2021 upon evaluation today patient appears  to be making progress. Still we have not gotten this completely closed but the wound is significantly smaller compared to what it was last week. I do believe the collagen is doing a good job. Objective Constitutional Well-nourished and well-hydrated in no acute distress. Vitals Time Taken: 8:43 AM, Height: 70 in, Weight: 215 lbs, BMI: 30.8, Temperature: 97.8 F, Pulse: 82 bpm, Respiratory Rate: 16 breaths/min, Blood Pressure: 158/95 mmHg. Respiratory normal breathing without difficulty. Psychiatric this patient is able to make decisions and demonstrates good insight into disease process. Alert and Oriented x 3. pleasant and cooperative. General Notes: Patient's wound bed showed signs of good granulation and epithelization at this point. Fortunately I do not see any evidence of active infection systemically which is great news. Integumentary (Hair, Skin) Wound #1 status is Open. Original cause of wound was Blister. The date acquired was: 08/19/2021. The wound has been in treatment 4 weeks. The wound is located on the Left,Medial Upper Leg. The wound measures 0.6cm length x 0.3cm width x 0.3cm depth; 0.141cm^2 area and 0.042cm^3 volume. There is Fat Layer (Subcutaneous Tissue) exposed. There is no tunneling noted, however, there is undermining starting at 12:00 and ending at 4:00 with a maximum distance of 0.2cm. There is a medium amount of serosanguineous drainage noted. There is no granulation within the wound bed. There is a large (67-100%) amount of necrotic tissue within the wound bed including Eschar and Adherent Slough. 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 Nurse Visit as needed Bathing/ Shower/ Hygiene: Clean wound with Normal Saline or wound cleanser. - keep dressing dry May shower with wound dressing protected with water repellent cover or cast protector. No tub bath. Edema Control - Lymphedema / Segmental Compressive Device / Other: Tubigrip single layer applied. - left leg E size DO YOUR BEST to sleep in the bed at night. DO NOT sleep in your recliner. Long hours of sitting in a recliner leads to swelling of the legs and/or potential wounds on your backside. Additional Orders / Instructions: Follow Nutritious Diet and Increase Protein Intake Other: - continue to check blood sugar Medications-Please add to medication list.: Take one  Tylenol (Acetaminophen) and one  Motrin (Ibuprofen) every 6 hours for pain. Do not take ibuprofen if you are on blood thinners or have stomach ulcers. Other: - take BP meds and monitor daily-contact your prescriber if BP con't to be elevated with taking meds as prescribed- WOUND #1: - Upper Leg Wound Laterality: Left, Medial Cleanser: Normal Saline Every Other Day/15 Days Discharge Instructions: Wash your hands with soap and water. Remove old dressing, discard into plastic bag and place into trash. Cleanse the wound with Normal Saline prior to applying a clean dressing using gauze sponges, not tissues or cotton balls. Do not scrub or use excessive force. Pat dry using gauze sponges, not tissue or cotton balls. Cleanser: Soap and Water Every Other Day/15 Days Discharge Instructions: Gently cleanse wound with antibacterial soap, rinse and pat dry prior to dressing wounds Primary Dressing: Prisma 4.34  (in) Every Other Day/15 Days Discharge Instructions: Moisten w/normal saline or sterile water; Cover wound as directed. Do not remove from wound bed. Secondary Dressing: ABD Pad 5x9 (in/in) Every Other Day/15 Days Discharge Instructions: Cover with ABD pad Secured With: 65M Medipore H Soft  Cloth Surgical Tape, 2x2 (in/yd) Every Other Day/15 Days Secured With: Kerlix Roll Sterile or Non-Sterile 6-ply 4.5x4 (yd/yd) Every Other Day/15 Days Discharge Instructions: Apply Kerlix as directed Secured With: Tubigrip Size E, 3.5x10 (in/yds) Every Other Day/15 Days Discharge Instructions: Apply 3 Tubigrip E 3-finger-widths below knee to base of toes to secure dressing and/or for swelling. 1. Would recommend currently that we go ahead and continue with the silver collagen dressing I think this is probably still the best way to go. 2. I am also can recommend that we have the patient continue to monitor for any signs of worsening or infection. Obviously he is in agreement with that plan and he will continue to keep track of things. He does go on Thursday for a fitting with regard to his prosthesis. 3. I am also can recommend that he continue to change the dressing every 2 to 3 days applying fresh collagen at that time. We will see patient back for reevaluation in 1 week here in the clinic. If anything worsens or changes patient will contact our office for additional recommendations. Electronic Signature(s) Signed: 10/17/2021 9:14:53 AM By: Lenda Kelp PA-C Entered By: Lenda Kelp on 10/17/2021 09:14:53 Richard Mendoza ([id number]) -------------------------------------------------------------------------------- SuperBill Details Patient Name: Richard Mendoza Date of Service: 10/17/2021 Medical Record Number: [id number] Patient Account Number: [id number] Date of Birth/Sex: Jul 14, 1976 (45 y.o. M) Treating RN: Hansel Feinstein Primary Care Provider: Barbette Reichmann Other Clinician: Referring Provider:  Barbette Reichmann Treating Provider/Extender: Rowan Blase in Treatment: 4 Diagnosis Coding ICD-10 Codes Code Description E11.622 Type 2 diabetes mellitus with other skin ulcer L98.492 Non-pressure chronic ulcer of skin of other sites with fat layer exposed Z89.512 Acquired absence of left leg below knee I10 Essential (primary) hypertension Facility Procedures CPT4 Code: [id number] Description: [phone] - WOUND CARE VISIT-LEV 2 EST PT Modifier: Quantity: 1 Physician Procedures CPT4 Code: [id number] Description: 99213 - WC PHYS LEVEL 3 - EST PT Modifier: Quantity: 1 CPT4 Code: Description: ICD-10 Diagnosis Description E11.622 Type 2 diabetes mellitus with other skin ulcer L98.492 Non-pressure chronic ulcer of skin of other sites with fat layer expos Z89.512 Acquired absence of left leg below knee I10 Essential (primary)  hypertension Modifier: ed Quantity: Electronic Signature(s) Signed: 10/17/2021 9:30:03 AM By: Lenda Kelp PA-C Entered By: Lenda Kelp on 10/17/2021 09:30:02

## 2021-10-24 ENCOUNTER — Encounter: Payer: Medicaid Other | Attending: Physician Assistant | Admitting: Physician Assistant

## 2021-10-24 ENCOUNTER — Other Ambulatory Visit: Payer: Self-pay

## 2021-10-24 DIAGNOSIS — L98492 Non-pressure chronic ulcer of skin of other sites with fat layer exposed: Secondary | ICD-10-CM | POA: Insufficient documentation

## 2021-10-24 DIAGNOSIS — E1140 Type 2 diabetes mellitus with diabetic neuropathy, unspecified: Secondary | ICD-10-CM | POA: Diagnosis not present

## 2021-10-24 DIAGNOSIS — I10 Essential (primary) hypertension: Secondary | ICD-10-CM | POA: Diagnosis not present

## 2021-10-24 DIAGNOSIS — E11621 Type 2 diabetes mellitus with foot ulcer: Secondary | ICD-10-CM | POA: Insufficient documentation

## 2021-10-24 DIAGNOSIS — E11622 Type 2 diabetes mellitus with other skin ulcer: Secondary | ICD-10-CM | POA: Diagnosis not present

## 2021-10-24 NOTE — Progress Notes (Addendum)
LUIGI, STUCKEY ([id number]) Visit Report for 10/24/2021 Chief Complaint Document Details Patient Name: Richard Mendoza Date of Service: 10/24/2021 8:30 AM Medical Record Number: [id number] Patient Account Number: [id number] Date of Birth/Sex: 1975-12-12 (45 y.o. M) Treating RN: Hansel Feinstein Primary Care Provider: Barbette Reichmann Other Clinician: Referring Provider: Barbette Reichmann Treating Provider/Extender: Rowan Blase in Treatment: 5 Information Obtained from: Patient Chief Complaint Left LE Ulcers due to prosthesis pressure and rubbing Electronic Signature(s) Signed: 10/24/2021 8:36:02 AM By: Lenda Kelp PA-C Entered By: Lenda Kelp on 10/24/2021 08:36:01 Richard Mendoza ([id number]) -------------------------------------------------------------------------------- HPI Details Patient Name: Richard Mendoza Date of Service: 10/24/2021 8:30 AM Medical Record Number: [id number] Patient Account Number: [id number] Date of Birth/Sex: 05-09-1976 (45 y.o. M) Treating RN: Hansel Feinstein Primary Care Provider: Barbette Reichmann Other Clinician: Referring Provider: Barbette Reichmann Treating Provider/Extender: Rowan Blase in Treatment: 5 History of Present Illness HPI Description: 09/19/2021 upon evaluation today patient presents for initial inspection here in our clinic concerning issues he has been having with wounds over the medial and lateral portion of his knee that were caused by pressure from his prosthesis. Unfortunately this had shrunk over time as most do and he was just before getting a new size or rather it resized when this started causing the problem in September he had first got the prosthesis in April. Nonetheless he has not been able to wear the prosthesis now on this BKA due to the fact that again he has the wounds. 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 He does  have hypertension. In general the good news is this does not appear to be infected. 09/26/2021 upon evaluation today patient appears to be doing much better in regard to his wounds. He is going to have to have some sharp debridement today but overall I am extremely pleased with where things stand at this point. It does not appear to be any evidence of active infection which is great news. 10/03/2021 upon evaluation today patient appears to be doing well with regard to his wound on the medial aspect of his left knee region. This is significantly improved even compared to last week and I am overall extremely pleased with where we stand today. There does not appear to be any sign whatsoever of active infection which is great news as well. In general I think we are headed in the appropriate direction here. 10/10/2021 upon evaluation today patient appears to be doing well with regard to his leg ulcer. He has been tolerating the dressing changes without complication. Fortunately there does not appear to be any evidence of infection which is great news. Overall I think that he is making excellent progress that the [facility] is starting to stick to some degree currently. I think that we may want switch things up a little bit here and try something different. 10/17/2021 upon evaluation today patient appears to be making progress. Still we have not gotten this completely closed but the wound is significantly smaller compared to what it was last week. I do believe the collagen is doing a good job. 10/24/2021 upon evaluation today the patient actually appears to be doing excellent in regard to his wound. He has been tolerating the dressing changes without complication. Fortunately I think he is very close to complete resolution and the fact the last area that is open appears to have a pinpoint area tingling with little bit of a  divot but otherwise seems to be doing quite well. I am extremely pleased with  where we stand at this point. Electronic Signature(s) Signed: 10/24/2021 9:12:40 AM By: Lenda Kelp PA-C Previous Signature: 10/24/2021 8:40:07 AM Version By: Lenda Kelp PA-C Entered By: Lenda Kelp on 10/24/2021 09:12:40 Richard Mendoza ([id number]) -------------------------------------------------------------------------------- Physical Exam Details Patient Name: Richard Mendoza Date of Service: 10/24/2021 8:30 AM Medical Record Number: [id number] Patient Account Number: [id number] Date of Birth/Sex: 1975/12/14 (45 y.o. M) Treating RN: Hansel Feinstein Primary Care Provider: Barbette Reichmann Other Clinician: Referring Provider: Barbette Reichmann Treating Provider/Extender: Rowan Blase in Treatment: 5 Constitutional Well-nourished and well-hydrated in no acute distress. Respiratory normal breathing without difficulty. Psychiatric this patient is able to make decisions and demonstrates good insight into disease process. Alert and Oriented x 3. pleasant and cooperative. Notes Upon inspection patient's wound bed showed signs of good granulation epithelization at this point. He is wearing his prosthesis currently and it does not seem to be causing any trouble whatsoever which is great news as well. Overall I think that we are definitely headed in the right direction he may even be ready for discharge by next week. Electronic Signature(s) Signed: 10/24/2021 9:13:00 AM By: Lenda Kelp PA-C Entered By: Lenda Kelp on 10/24/2021 09:13:00 Richard Mendoza ([id number]) -------------------------------------------------------------------------------- Physician Orders Details Patient Name: Richard Mendoza Date of Service: 10/24/2021 8:30 AM Medical Record Number: [id number] Patient Account Number: [id number] Date of Birth/Sex: [birth year] (45 y.o. M) Treating RN: Hansel Feinstein Primary Care Provider: Barbette Reichmann Other Clinician: Referring Provider: Barbette Reichmann Treating  Provider/Extender: Rowan Blase in Treatment: 5 Verbal / Phone Orders: No Diagnosis Coding ICD-10 Coding Code Description E11.622 Type 2 diabetes mellitus with other skin ulcer L98.492 Non-pressure chronic ulcer of skin of other sites with fat layer exposed Z89.512 Acquired absence of left leg below knee I10 Essential (primary) hypertension Follow-up Appointments o Return Appointment in 1 week. o Nurse Visit as needed Bathing/ Shower/ Hygiene o Clean wound with Normal Saline or wound cleanser. - keep dressing dry o May shower with wound dressing protected with water repellent cover or cast protector. o No tub bath. Anesthetic (Use 'Patient Medications' Section for Anesthetic Order Entry) o Lidocaine applied to wound bed Edema Control - Lymphedema / Segmental Compressive Device / Other o Tubigrip single layer applied. - left leg E size o DO YOUR BEST to sleep in the bed at night. DO NOT sleep in your recliner. Long hours of sitting in a recliner leads to swelling of the legs and/or potential wounds on your backside. Additional Orders / Instructions o Follow Nutritious Diet and Increase Protein Intake o Other: - continue to check blood sugar Medications-Please add to medication list. o Take one  Tylenol (Acetaminophen) and one  Motrin (Ibuprofen) every 6 hours for pain. Do not take ibuprofen if you are on blood thinners or have stomach ulcers. o Other: - take BP meds and monitor daily-contact your prescriber if BP con't to be elevated with taking meds as prescribed- Wound Treatment Wound #1 - Upper Leg Wound Laterality: Left, Medial Cleanser: Normal Saline Every Other Day/15 Days Discharge Instructions: Wash your hands with soap and water. Remove old dressing, discard into plastic bag and place into trash. Cleanse the wound with Normal Saline prior to applying a clean dressing using gauze sponges, not tissues or cotton balls. Do not scrub or use  excessive force. Pat dry using gauze sponges, not tissue or cotton balls. Cleanser: Soap and Water Every Other Day/15  Days Discharge Instructions: Gently cleanse wound with antibacterial soap, rinse and pat dry prior to dressing wounds Primary Dressing: Xeroform-HBD 2x2 (in/in) Every Other Day/15 Days Discharge Instructions: Apply Xeroform-HBD 2x2 (in/in) as directed Secondary Dressing: ABD Pad 5x9 (in/in) Every Other Day/15 Days Discharge Instructions: Cover with ABD pad Secured With: 50M Medipore H Soft Cloth Surgical Tape, 2x2 (in/yd) Every Other Day/15 Days Secured With: Kerlix Roll Sterile or Non-Sterile 6-ply 4.5x4 (yd/yd) Every Other Day/15 Days Discharge Instructions: Apply Kerlix as directed Secured With: Tubigrip Size E, 3.5x10 (in/yds) Every Other Day/15 Days Flesch, Audel ([id number]) Discharge Instructions: Apply 3 Tubigrip E 3-finger-widths below knee to base of toes to secure dressing and/or for swelling. Electronic Signature(s) Signed: 10/24/2021 12:02:03 PM By: Hansel Feinstein Signed: 10/24/2021 4:19:41 PM By: Lenda Kelp PA-C Entered By: Hansel Feinstein on 10/24/2021 08:53:57 Richard Mendoza ([id number]) -------------------------------------------------------------------------------- Problem List Details Patient Name: Richard Mendoza Date of Service: 10/24/2021 8:30 AM Medical Record Number: [id number] Patient Account Number: [id number] Date of Birth/Sex: 07/02/1976 (45 y.o. M) Treating RN: Hansel Feinstein Primary Care Provider: Barbette Reichmann Other Clinician: Referring Provider: Barbette Reichmann Treating Provider/Extender: Rowan Blase in Treatment: 5 Active Problems ICD-10 Encounter Code Description Active Date MDM Diagnosis E11.622 Type 2 diabetes mellitus with other skin ulcer 09/19/2021 No Yes L98.492 Non-pressure chronic ulcer of skin of other sites with fat layer exposed 09/19/2021 No Yes Z89.512 Acquired absence of left leg below knee 09/19/2021 No Yes I10  Essential (primary) hypertension 09/19/2021 No Yes Inactive Problems Resolved Problems Electronic Signature(s) Signed: 10/24/2021 8:35:58 AM By: Lenda Kelp PA-C Entered By: Lenda Kelp on 10/24/2021 08:35:58 [place], Casimiro Needle ([id number]) -------------------------------------------------------------------------------- Progress Note Details Patient Name: Richard Mendoza Date of Service: 10/24/2021 8:30 AM Medical Record Number: [id number] Patient Account Number: [id number] Date of Birth/Sex: 05-Aug-1976 (45 y.o. M) Treating RN: Hansel Feinstein Primary Care Provider: Barbette Reichmann Other Clinician: Referring Provider: Barbette Reichmann Treating Provider/Extender: Rowan Blase in Treatment: 5 Subjective Chief Complaint Information obtained from Patient Left LE Ulcers due to prosthesis pressure and rubbing History of Present Illness (HPI) 09/19/2021 upon evaluation today patient presents for initial inspection here in our clinic concerning issues he has been having with wounds over the medial and lateral portion of his knee that were caused by pressure from his prosthesis. Unfortunately this had shrunk over time as most do and he was just before getting a new size or rather it resized when this started causing the problem in September he had first got the prosthesis in April. Nonetheless he has not been able to wear the prosthesis now on this BKA due to the fact that again he has the wounds. 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 He does have hypertension. In general the good news is this does not appear to be infected. 09/26/2021 upon evaluation today patient appears to be doing much better in regard to his wounds. He is going to have to have some sharp debridement today but overall I am extremely pleased with where things stand at this point. It does not appear to be any evidence of  active infection which is great news. 10/03/2021 upon evaluation today patient appears to be doing well with regard to his wound on the medial aspect of his left knee region. This is significantly improved even compared to last week and I am overall extremely pleased with where we stand today. There does not appear to  be any sign whatsoever of active infection which is great news as well. In general I think we are headed in the appropriate direction here. 10/10/2021 upon evaluation today patient appears to be doing well with regard to his leg ulcer. He has been tolerating the dressing changes without complication. Fortunately there does not appear to be any evidence of infection which is great news. Overall I think that he is making excellent progress that the [facility] is starting to stick to some degree currently. I think that we may want switch things up a little bit here and try something different. 10/17/2021 upon evaluation today patient appears to be making progress. Still we have not gotten this completely closed but the wound is significantly smaller compared to what it was last week. I do believe the collagen is doing a good job. 10/24/2021 upon evaluation today the patient actually appears to be doing excellent in regard to his wound. He has been tolerating the dressing changes without complication. Fortunately I think he is very close to complete resolution and the fact the last area that is open appears to have a pinpoint area tingling with little bit of a divot but otherwise seems to be doing quite well. I am extremely pleased with where we stand at this point. Objective Constitutional Well-nourished and well-hydrated in no acute distress. Vitals Time Taken: 8:35 AM, Height: 70 in, Weight: 215 lbs, BMI: 30.8, Temperature: 98.1 F, Pulse: 87 bpm, Respiratory Rate: 16 breaths/min, Blood Pressure: 176/107 mmHg. General Notes: Saw nephrology and meds adjusted for BP; monitors BP at  home; no dizziness or headache Respiratory normal breathing without difficulty. Psychiatric this patient is able to make decisions and demonstrates good insight into disease process. Alert and Oriented x 3. pleasant and cooperative. General Notes: Upon inspection patient's wound bed showed signs of good granulation epithelization at this point. He is wearing his prosthesis currently and it does not seem to be causing any trouble whatsoever which is great news as well. Overall I think that we are definitely headed in the right direction he may even be ready for discharge by next week. Integumentary (Hair, Skin) Calame, Kashawn ([id number]) Wound #1 status is Open. Original cause of wound was Blister. The date acquired was: 08/19/2021. The wound has been in treatment 5 weeks. The wound is located on the Left,Medial Upper Leg. The wound measures 0.3cm length x 0.2cm width x 0.3cm depth; 0.047cm^2 area and 0.014cm^3 volume. There is Fat Layer (Subcutaneous Tissue) exposed. There is no tunneling noted, however, there is undermining starting at 1:00 and ending at 3:00 with a maximum distance of 0.3cm. There is a medium amount of serosanguineous drainage noted. There is large (67- 100%) granulation within the wound bed. There is a small (1-33%) amount of necrotic tissue within the wound bed including Adherent Slough. 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 Nurse Visit as needed Bathing/ Shower/ Hygiene: Clean wound with Normal Saline or wound cleanser. - keep dressing dry May shower with wound dressing protected with water repellent cover or cast protector. No tub bath. Anesthetic (Use 'Patient Medications' Section for Anesthetic Order Entry): Lidocaine  applied to wound bed Edema Control - Lymphedema / Segmental Compressive Device / Other: Tubigrip single layer applied. - left leg E size DO YOUR BEST to sleep in  the bed at night. DO NOT sleep in your recliner. Long hours of sitting in a recliner leads to swelling of the legs and/or potential wounds on your backside. Additional Orders / Instructions: Follow Nutritious Diet and Increase Protein Intake Other: - continue to check blood sugar Medications-Please add to medication list.: Take one  Tylenol (Acetaminophen) and one  Motrin (Ibuprofen) every 6 hours for pain. Do not take ibuprofen if you are on blood thinners or have stomach ulcers. Other: - take BP meds and monitor daily-contact your prescriber if BP con't to be elevated with taking meds as prescribed- WOUND #1: - Upper Leg Wound Laterality: Left, Medial Cleanser: Normal Saline Every Other Day/15 Days Discharge Instructions: Wash your hands with soap and water. Remove old dressing, discard into plastic bag and place into trash. Cleanse the wound with Normal Saline prior to applying a clean dressing using gauze sponges, not tissues or cotton balls. Do not scrub or use excessive force. Pat dry using gauze sponges, not tissue or cotton balls. Cleanser: Soap and Water Every Other Day/15 Days Discharge Instructions: Gently cleanse wound with antibacterial soap, rinse and pat dry prior to dressing wounds Primary Dressing: Xeroform-HBD 2x2 (in/in) Every Other Day/15 Days Discharge Instructions: Apply Xeroform-HBD 2x2 (in/in) as directed Secondary Dressing: ABD Pad 5x9 (in/in) Every Other Day/15 Days Discharge Instructions: Cover with ABD pad Secured With: 58M Medipore H Soft Cloth Surgical Tape, 2x2 (in/yd) Every Other Day/15 Days Secured With: [employer] Sterile or Non-Sterile 6-ply 4.5x4 (yd/yd) Every Other Day/15 Days Discharge Instructions: Apply Kerlix as directed Secured With: Tubigrip Size E, 3.5x10 (in/yds) Every Other  Day/15 Days Discharge Instructions: Apply 3 Tubigrip E 3-finger-widths below knee to base of toes to secure dressing and/or for swelling. 1. I am good recommend that we go ahead and continue with the wound care measures as before and the patient is in agreement with the plan. This includes the use of the Xeroform gauze actually which will be the one switch to the remaining open wound area I think this will keep it moist and allow it hopefully still appearing to be completely done by next week. 2. I am also can recommend we continue with the border foam dressing to cover. 3. I am also can suggest continuation of the use of his Tubigrip to secure everything in place and I am fine with him using his prosthesis as it seems to be causing no issues currently. We will see patient back for reevaluation in 1 week here in the clinic. If anything worsens or changes patient will contact our office for additional recommendations. SONIA, STICKELS ([id number]) Electronic Signature(s) Signed: 10/24/2021 9:13:46 AM By: Lenda Kelp PA-C Entered By: Lenda Kelp on 10/24/2021 09:13:45 Richard Mendoza ([id number]) -------------------------------------------------------------------------------- SuperBill Details Patient Name: Richard Mendoza Date of Service: 10/24/2021 Medical Record Number: [id number] Patient Account Number: [id number] Date of Birth/Sex: 1975-12-14 (45 y.o. M) Treating RN: Hansel Feinstein Primary Care Provider: Barbette Reichmann Other Clinician: Referring Provider: Barbette Reichmann Treating Provider/Extender: Rowan Blase in Treatment: 5 Diagnosis Coding ICD-10 Codes Code Description E11.622 Type 2 diabetes mellitus with other skin ulcer L98.492 Non-pressure chronic ulcer of skin of other sites with fat layer exposed Z89.512 Acquired absence of left leg below knee I10 Essential (primary) hypertension Facility Procedures CPT4 Code: 08657846 Description: [phone] - WOUND CARE VISIT-LEV  2 EST PT Modifier: Quantity: 1 Physician Procedures CPT4 Code: [id number] Description: 99214 - WC PHYS LEVEL 4 - EST PT Modifier: Quantity: 1 CPT4 Code: Description: ICD-10 Diagnosis Description  E11.622 Type 2 diabetes mellitus with other skin ulcer L98.492 Non-pressure chronic ulcer of skin of other sites with fat layer expos Z89.512 Acquired absence of left leg below knee I10 Essential (primary)  hypertension Modifier: ed Quantity: Electronic Signature(s) Signed: 10/24/2021 9:13:59 AM By: Lenda Kelp PA-C Entered By: Lenda Kelp on 10/24/2021 09:13:58

## 2021-10-24 NOTE — Progress Notes (Addendum)
KEYLOR, BONIFIELD ([id number]) Visit Report for 10/24/2021 Arrival Information Details Patient Name: Richard Mendoza, Richard Mendoza Date of Service: 10/24/2021 8:30 AM Medical Record Number: [id number] Patient Account Number: [id number] Date of Birth/Sex: 1976/06/02 (45 y.o. M) Treating RN: Donnamarie Poag Primary Care Jigar Zielke: Tracie Harrier Other Clinician: Referring Kennedy Bohanon: Tracie Harrier Treating Deondrick Searls/Extender: Skipper Cliche in Treatment: 5 Visit Information History Since Last Visit Added or deleted any medications: No Patient Arrived: Ambulatory Had a fall or experienced change in No Arrival Time: 08:34 activities of daily living that may affect Accompanied By: self risk of falls: Transfer Assistance: None Hospitalized since last visit: No Patient Identification Verified: Yes Has Dressing in Place as Prescribed: Yes Secondary Verification Process Completed: Yes Pain Present Now: No Patient Requires Transmission-Based No Precautions: Patient Has Alerts: Yes Patient Alerts: DIABETIC/insulin pump Left BKA AVVS ABI R 1.31 -7/22 Electronic Signature(s) Signed: 10/24/2021 12:02:03 PM By: Donnamarie Poag Entered By: Donnamarie Poag on 10/24/2021 08:35:01 Richard Mendoza ([id number]) -------------------------------------------------------------------------------- Clinic Level of Care Assessment Details Patient Name: Richard Mendoza Date of Service: 10/24/2021 8:30 AM Medical Record Number: [id number] Patient Account Number: [id number] Date of Birth/Sex: 1975-12-04 (45 y.o. M) Treating RN: Donnamarie Poag Primary Care Shakiyah Cirilo: Tracie Harrier Other Clinician: Referring Rudolph Dobler: Tracie Harrier Treating Jago Carton/Extender: Skipper Cliche in Treatment: 5 Clinic Level of Care Assessment Items TOOL 4 Quantity Score []  - Use when only an EandM is performed on FOLLOW-UP visit 0 ASSESSMENTS - Nursing Assessment / Reassessment []  - Reassessment of Co-morbidities (includes updates in patient  status) 0 []  - 0 Reassessment of Adherence to Treatment Plan ASSESSMENTS - Wound and Skin Assessment / Reassessment X - Simple Wound Assessment / Reassessment - one wound 1 5 []  - 0 Complex Wound Assessment / Reassessment - multiple wounds []  - 0 Dermatologic / Skin Assessment (not related to wound area) ASSESSMENTS - Focused Assessment []  - Circumferential Edema Measurements - multi extremities 0 []  - 0 Nutritional Assessment / Counseling / Intervention []  - 0 Lower Extremity Assessment (monofilament, tuning fork, pulses) []  - 0 Peripheral Arterial Disease Assessment (using hand held doppler) ASSESSMENTS - Ostomy and/or Continence Assessment and Care []  - Incontinence Assessment and Management 0 []  - 0 Ostomy Care Assessment and Management (repouching, etc.) PROCESS - Coordination of Care X - Simple Patient / Family Education for ongoing care 1 15 []  - 0 Complex (extensive) Patient / Family Education for ongoing care []  - 0 Staff obtains Programmer, systems, Records, Test Results / Process Orders []  - 0 Staff telephones HHA, Nursing Homes / Clarify orders / etc []  - 0 Routine Transfer to another Facility (non-emergent condition) []  - 0 Routine Hospital Admission (non-emergent condition) []  - 0 New Admissions / Biomedical engineer / Ordering NPWT, Apligraf, etc. []  - 0 Emergency Hospital Admission (emergent condition) X- 1 10 Simple Discharge Coordination []  - 0 Complex (extensive) Discharge Coordination PROCESS - Special Needs []  - Pediatric / Minor Patient Management 0 []  - 0 Isolation Patient Management []  - 0 Hearing / Language / Visual special needs []  - 0 Assessment of Community assistance (transportation, D/C planning, etc.) []  - 0 Additional assistance / Altered mentation []  - 0 Support Surface(s) Assessment (bed, cushion, seat, etc.) INTERVENTIONS - Wound Cleansing / Measurement Salata, Keri ([id number]) X- 1 5 Simple Wound Cleansing - one wound []  -  0 Complex Wound Cleansing - multiple wounds X- 1 5 Wound Imaging (photographs - any number of wounds) []  - 0 Wound Tracing (instead of photographs) X- 1 5 Simple Wound Measurement - one wound []  -  0 Complex Wound Measurement - multiple wounds INTERVENTIONS - Wound Dressings X - Small Wound Dressing one or multiple wounds 1 10 []  - 0 Medium Wound Dressing one or multiple wounds []  - 0 Large Wound Dressing one or multiple wounds X- 1 5 Application of Medications - topical []  - 0 Application of Medications - injection INTERVENTIONS - Miscellaneous []  - External ear exam 0 []  - 0 Specimen Collection (cultures, biopsies, blood, body fluids, etc.) []  - 0 Specimen(s) / Culture(s) sent or taken to Lab for analysis []  - 0 Patient Transfer (multiple staff / Civil Service fast streamer / Similar devices) []  - 0 Simple Staple / Suture removal (25 or less) []  - 0 Complex Staple / Suture removal (26 or more) []  - 0 Hypo / Hyperglycemic Management (close monitor of Blood Glucose) []  - 0 Ankle / Brachial Index (ABI) - do not check if billed separately X- 1 5 Vital Signs Has the patient been seen at the hospital within the last three years: Yes Total Score: 65 Level Of Care: New/Established - Level 2 Electronic Signature(s) Signed: 10/24/2021 12:02:03 PM By: Donnamarie Poag Entered By: Donnamarie Poag on 10/24/2021 08:54:50 Richard Mendoza ([id number]) -------------------------------------------------------------------------------- Encounter Discharge Information Details Patient Name: Richard Mendoza Date of Service: 10/24/2021 8:30 AM Medical Record Number: [id number] Patient Account Number: [id number] Date of Birth/Sex: Aug 14, 1976 (45 y.o. M) Treating RN: Donnamarie Poag Primary Care Danny Zimny: Tracie Harrier Other Clinician: Referring Tiffny Gemmer: Tracie Harrier Treating Othel Dicostanzo/Extender: Skipper Cliche in Treatment: 5 Encounter Discharge Information Items Discharge Condition: Stable Ambulatory  Status: Ambulatory Discharge Destination: Home Transportation: Private Auto Accompanied By: self Schedule Follow-up Appointment: Yes Clinical Summary of Care: Electronic Signature(s) Signed: 10/24/2021 12:02:03 PM By: Donnamarie Poag Entered By: Donnamarie Poag on 10/24/2021 09:03:24 Richard Mendoza ([id number]) -------------------------------------------------------------------------------- Lower Extremity Assessment Details Patient Name: Richard Mendoza Date of Service: 10/24/2021 8:30 AM Medical Record Number: [id number] Patient Account Number: [id number] Date of Birth/Sex: [birth year] (45 y.o. M) Treating RN: Donnamarie Poag Primary Care Royetta Probus: Tracie Harrier Other Clinician: Referring Yulieth Carrender: Tracie Harrier Treating Courtland Coppa/Extender: Skipper Cliche in Treatment: 5 Edema Assessment Assessed: [Left: Yes] [Right: No] Edema: [Left: N] [Right: o] Notes [facility] Electronic Signature(s) Signed: 10/24/2021 12:02:03 PM By: Donnamarie Poag Entered By: Donnamarie Poag on 10/24/2021 08:43:26 Noguchi, Legrand Como ([id number]) -------------------------------------------------------------------------------- Multi Wound Chart Details Patient Name: Richard Mendoza Date of Service: 10/24/2021 8:30 AM Medical Record Number: [id number] Patient Account Number: [id number] Date of Birth/Sex: 1976-01-26 (45 y.o. M) Treating RN: Donnamarie Poag Primary Care Delsa Walder: Tracie Harrier Other Clinician: Referring Robson Trickey: Tracie Harrier Treating Noreen Mackintosh/Extender: Skipper Cliche in Treatment: 5 Vital Signs Height(in): 70 Pulse(bpm): 2 Weight(lbs): 215 Blood Pressure(mmHg): 176/107 Body Mass Index(BMI): 31 Temperature(F): 98.1 Respiratory Rate(breaths/min): 16 Photos: [N/A:N/A] Wound Location: Left, Medial Upper Leg N/A N/A Wounding Event: Blister N/A N/A Primary Etiology: Pressure Ulcer N/A N/A Secondary Etiology: Diabetic Wound/Ulcer of the Lower N/A N/A Extremity Comorbid History: Coronary Artery  Disease, N/A N/A Hypertension, Type II Diabetes, Neuropathy Date Acquired: 08/19/2021 N/A N/A Weeks of Treatment: 5 N/A N/A Wound Status: Open N/A N/A Clustered Wound: Yes N/A N/A Measurements L x W x D (cm) 0.3x0.2x0.3 N/A N/A Area (cm) : 0.047 N/A N/A Volume (cm) : 0.014 N/A N/A % Reduction in Area: 99.80% N/A N/A % Reduction in Volume: 99.80% N/A N/A Starting Position 1 (o'clock): 1 Ending Position 1 (o'clock): 3 Maximum Distance 1 (cm): 0.3 Undermining: Yes N/A N/A Classification: Category/Stage III N/A N/A Exudate Amount: Medium N/A N/A Exudate Type: Serosanguineous N/A N/A Exudate Color: red, brown N/A  N/A Granulation Amount: Large (67-100%) N/A N/A Necrotic Amount: Small (1-33%) N/A N/A Exposed Structures: Fat Layer (Subcutaneous Tissue): N/A N/A Yes Fascia: No Tendon: No Muscle: No Joint: No Bone: No Treatment Notes Electronic Signature(s) Signed: 10/24/2021 12:02:03 PM By: Oleh Genin, [place] ([id number]) Entered By: Donnamarie Poag on 10/24/2021 08:49:42 Richard Mendoza ([id number]) -------------------------------------------------------------------------------- Multi-Disciplinary Care Plan Details Patient Name: Richard Mendoza Date of Service: 10/24/2021 8:30 AM Medical Record Number: [id number] Patient Account Number: [id number] Date of Birth/Sex: November 06, 1976 (45 y.o. M) Treating RN: Donnamarie Poag Primary Care Ocie Stanzione: Tracie Harrier Other Clinician: Referring Yassin Scales: Tracie Harrier Treating Opie Fanton/Extender: Skipper Cliche in Treatment: 5 Active Inactive Electronic Signature(s) Signed: 11/07/2021 1:06:26 PM By: Donnamarie Poag Previous Signature: 10/24/2021 12:02:03 PM Version By: Donnamarie Poag Entered By: Donnamarie Poag on 11/07/2021 13:06:25 Richard Mendoza ([id number]) -------------------------------------------------------------------------------- Pain Assessment Details Patient Name: Richard Mendoza Date of Service: 10/24/2021 8:30 AM Medical  Record Number: [id number] Patient Account Number: [id number] Date of Birth/Sex: 15-Nov-1976 (45 y.o. M) Treating RN: Donnamarie Poag Primary Care Larry Knipp: Tracie Harrier Other Clinician: Referring Carsin Randazzo: Tracie Harrier Treating Elmor Kost/Extender: Skipper Cliche in Treatment: 5 Active Problems Location of Pain Severity and Description of Pain Patient Has Paino No Site Locations Rate the pain. Current Pain Level: 0 Pain Management and Medication Current Pain Management: Electronic Signature(s) Signed: 10/24/2021 12:02:03 PM By: Donnamarie Poag Entered By: Donnamarie Poag on 10/24/2021 08:39:31 Richard Mendoza ([id number]) -------------------------------------------------------------------------------- Patient/Caregiver Education Details Patient Name: Richard Mendoza Date of Service: 10/24/2021 8:30 AM Medical Record Number: [id number] Patient Account Number: [id number] Date of Birth/Gender: 19-Jan-1976 (45 y.o. M) Treating RN: Donnamarie Poag Primary Care Physician: Tracie Harrier Other Clinician: Referring Physician: Tracie Harrier Treating Physician/Extender: Skipper Cliche in Treatment: 5 Education Assessment Education Provided To: Patient Education Topics Provided Basic Hygiene: Wound/Skin Impairment: Electronic Signature(s) Signed: 10/24/2021 12:02:03 PM By: Donnamarie Poag Entered By: Donnamarie Poag on 10/24/2021 08:55:05 Richard Mendoza ([id number]) -------------------------------------------------------------------------------- Wound Assessment Details Patient Name: Richard Mendoza Date of Service: 10/24/2021 8:30 AM Medical Record Number: [id number] Patient Account Number: [id number] Date of Birth/Sex: Oct 29, 1976 (45 y.o. M) Treating RN: Donnamarie Poag Primary Care Hollis Oh: Tracie Harrier Other Clinician: Referring Karion Cudd: Tracie Harrier Treating Demiya Magno/Extender: Skipper Cliche in Treatment: 5 Wound Status Wound Number: 1 Primary Etiology: Pressure Ulcer Wound  Location: Left, Medial Upper Leg Secondary Diabetic Wound/Ulcer of the Lower Extremity Etiology: Wounding Event: Blister Wound Status: Open Date Acquired: 08/19/2021 Notes: measurement is boxed Weeks Of Treatment: 5 Comorbid Coronary Artery Disease, Hypertension, Type II Clustered Wound: Yes History: Diabetes, Neuropathy Photos Wound Measurements Length: (cm) 0.3 Width: (cm) 0.2 Depth: (cm) 0.3 Area: (cm) 0.047 Volume: (cm) 0.014 % Reduction in Area: 99.8% % Reduction in Volume: 99.8% Tunneling: No Undermining: Yes Starting Position (o'clock): 1 Ending Position (o'clock): 3 Maximum Distance: (cm) 0.3 Wound Description Classification: Category/Stage III Exudate Amount: Medium Exudate Type: Serosanguineous Exudate Color: red, brown Foul Odor After Cleansing: No Slough/Fibrino Yes Wound Bed Granulation Amount: Large (67-100%) Exposed Structure Necrotic Amount: Small (1-33%) Fascia Exposed: No Necrotic Quality: Adherent Slough Fat Layer (Subcutaneous Tissue) Exposed: Yes Tendon Exposed: No Muscle Exposed: No Joint Exposed: No Bone Exposed: No Electronic Signature(s) Signed: 10/24/2021 12:02:03 PM By: Donnamarie Poag Entered By: Donnamarie Poag on 10/24/2021 08:41:50 Richard Mendoza ([id number]) -------------------------------------------------------------------------------- [address] Details Patient Name: Richard Mendoza Date of Service: 10/24/2021 8:30 AM Medical Record Number: [id number] Patient Account Number: [id number] Date of Birth/Sex: 1976/08/31 (45 y.o. M) Treating RN: Donnamarie Poag Primary Care Jerelene Salaam: Tracie Harrier Other Clinician: Referring Ceyda Peterka: Tracie Harrier Treating Loribeth Katich/Extender: Jeri Cos  Weeks in Treatment: 5 Vital Signs Time Taken: 08:35 Temperature (F): 98.1 Height (in): 70 Pulse (bpm): 87 Weight (lbs): 215 Respiratory Rate (breaths/min): 16 Body Mass Index (BMI): 30.8 Blood Pressure (mmHg): 176/107 Reference Range: 80 - 120 mg /  dl Notes Saw nephrology and meds adjusted for BP; monitors BP at home; no dizziness or headache Electronic Signature(s) Signed: 10/24/2021 12:02:03 PM By: Hansel Feinstein Entered ByHansel Feinstein on 10/24/2021 08:42:51

## 2021-10-31 ENCOUNTER — Encounter: Payer: Medicaid Other | Admitting: Physician Assistant

## 2021-11-07 ENCOUNTER — Encounter: Payer: Medicaid Other | Admitting: Physician Assistant

## 2021-11-14 ENCOUNTER — Encounter: Payer: Medicaid Other | Admitting: Internal Medicine

## 2022-01-28 IMAGING — CT CT HEAD W/O CM
3 of 4 series · 15 of 47 positions shown, 18 images · non-contrast
Comparison: None.

CLINICAL DATA: Dizziness with fall and trauma to left-sided head a

EXAM:
CT HEAD WITHOUT CONTRAST
TECHNIQUE: Contiguous axial images were obtained from the base of the skull
through the vertex without intravenous contrast.

[Series 3: head 2.0 h70h · axial · 0.46mm/px · z∈[-85,+47]mm · 9 of 84 slices shown, 12 images]
[im 9/84  brain]
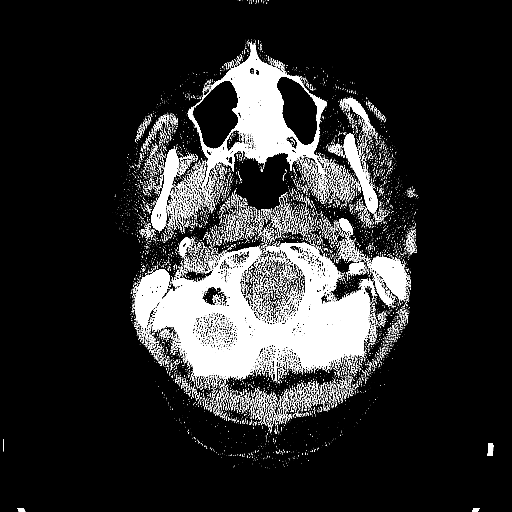
[im 9/84  bone]
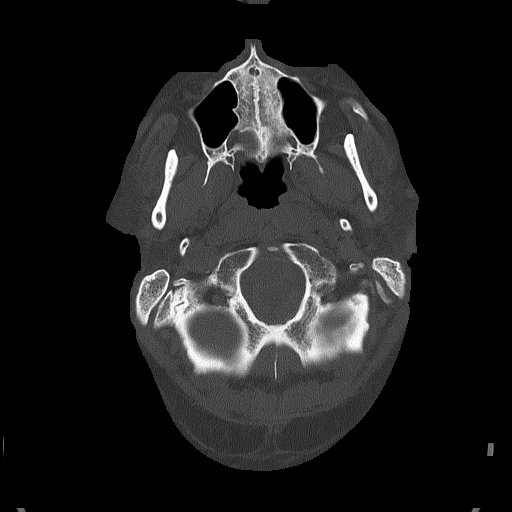
[im 17/84  brain]
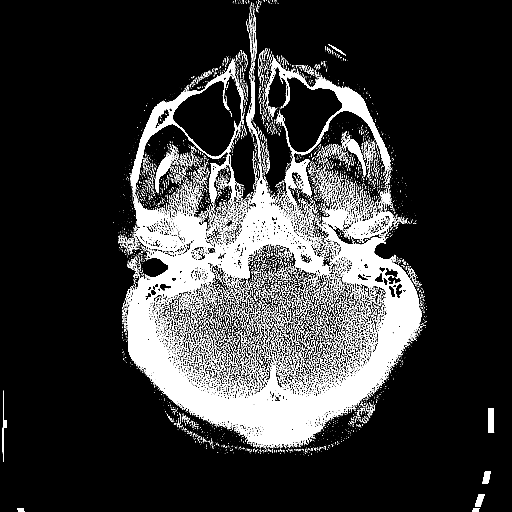
[im 25/84  brain]
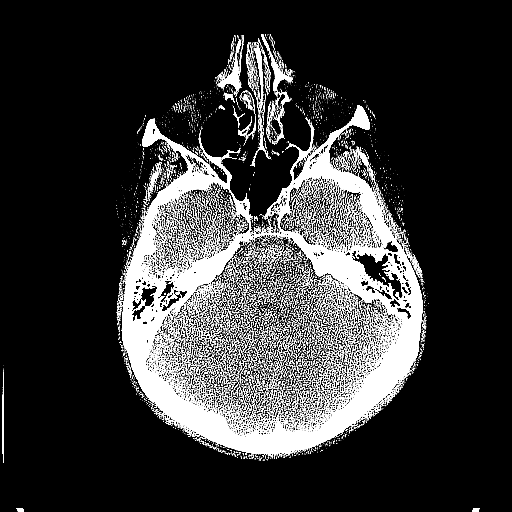
[im 34/84  brain]
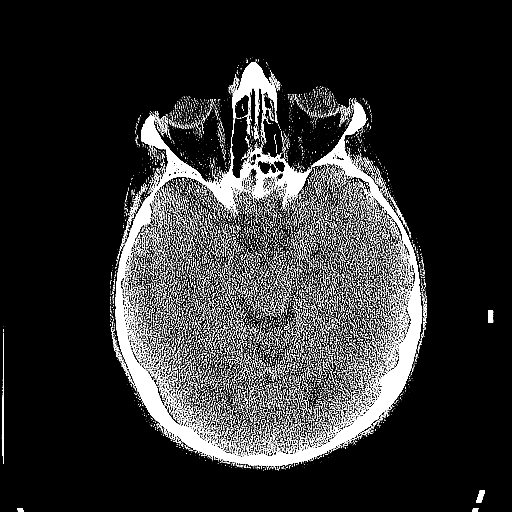
[im 42/84  brain]
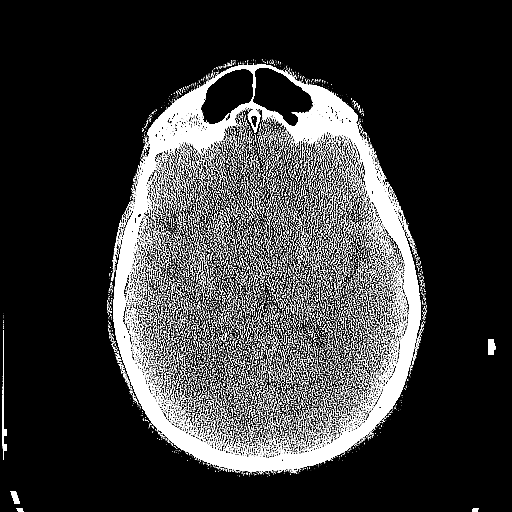
[im 42/84  bone]
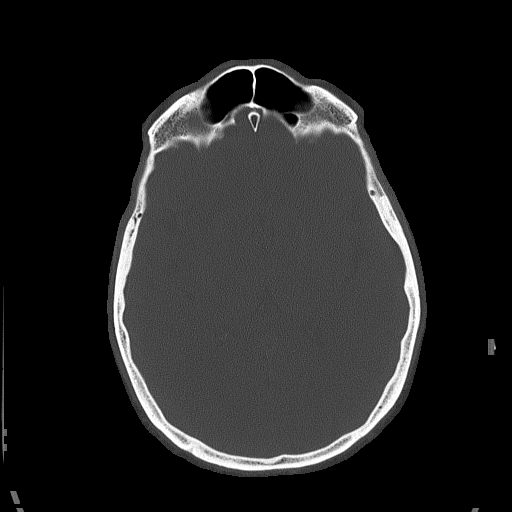
[im 50/84  brain]
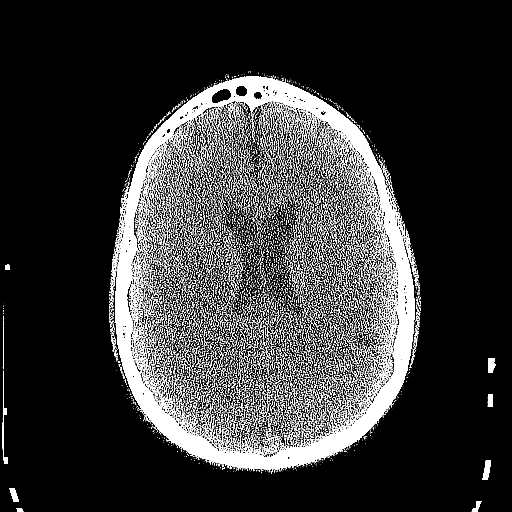
[im 59/84  brain]
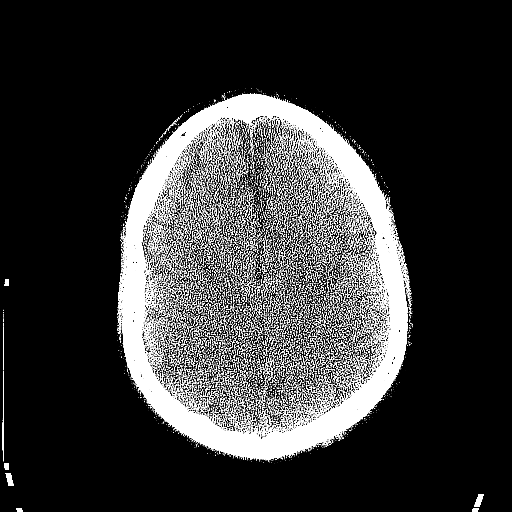
[im 67/84  brain]
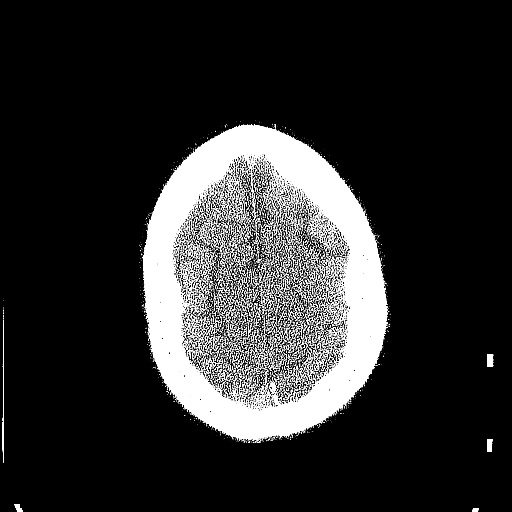
[im 75/84  brain]
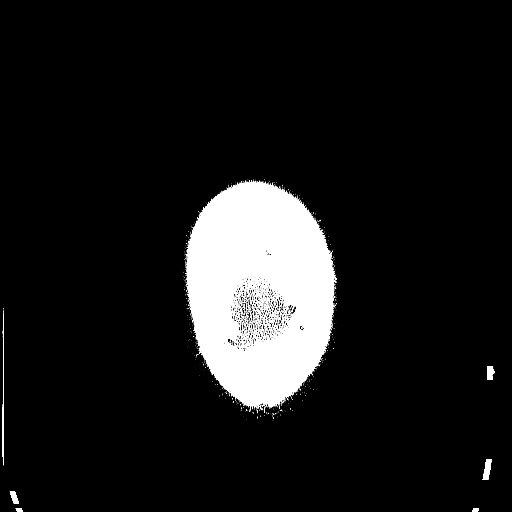
[im 75/84  bone]
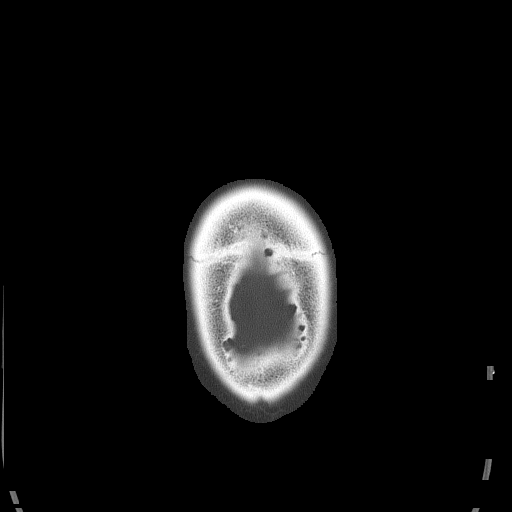

[Series 5: head 3.0 mpr cor · coronal · 0.34mm/px · 3 of 75 slices shown]
[im 25/75  brain]
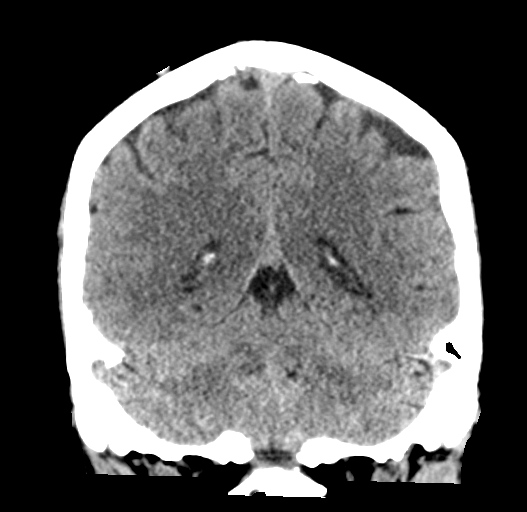
[im 33/75  brain]
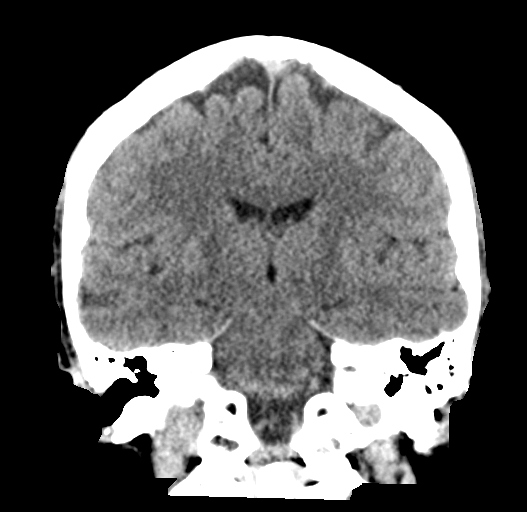
[im 42/75  brain]
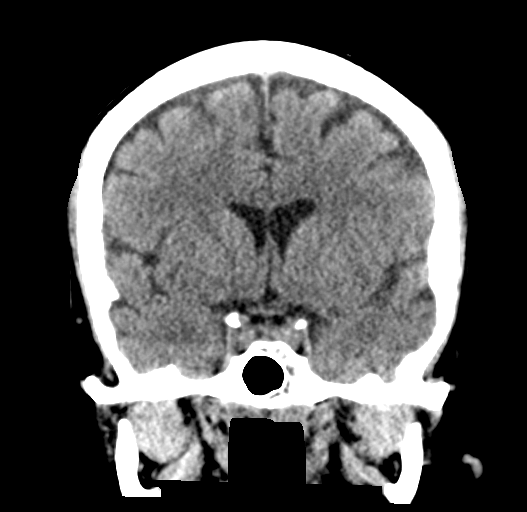

[Series 6: head 3.0 mpr sag · sagittal · 0.34mm/px · 3 of 56 slices shown]
[im 19/56  brain]
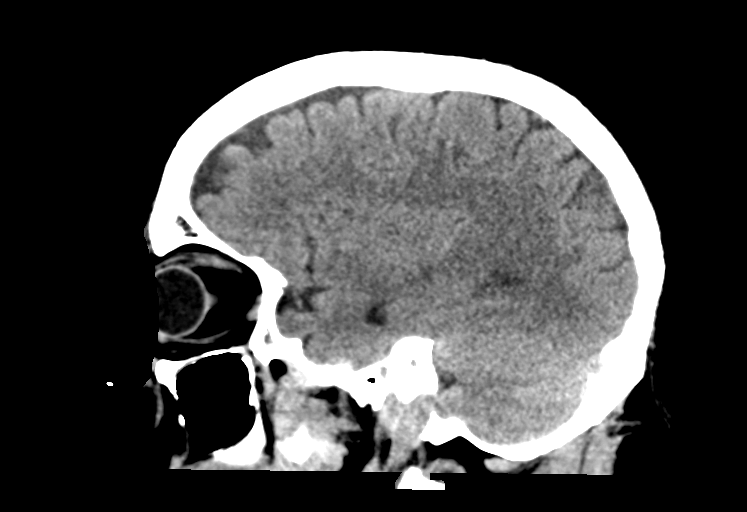
[im 28/56  brain]
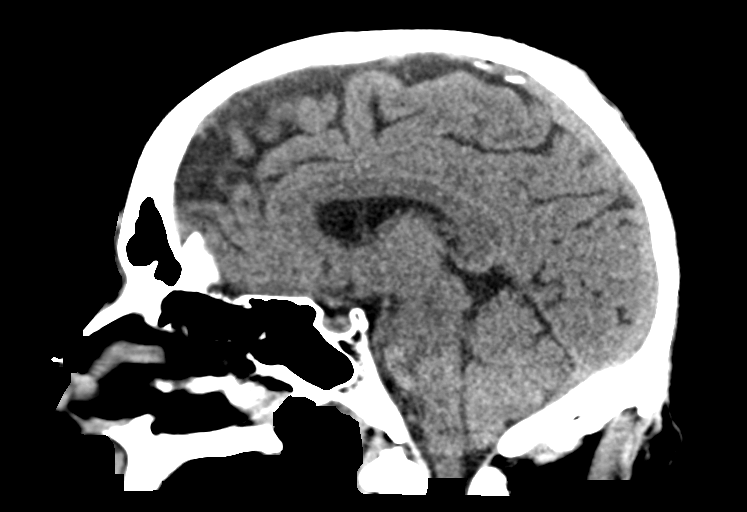
[im 37/56  brain]
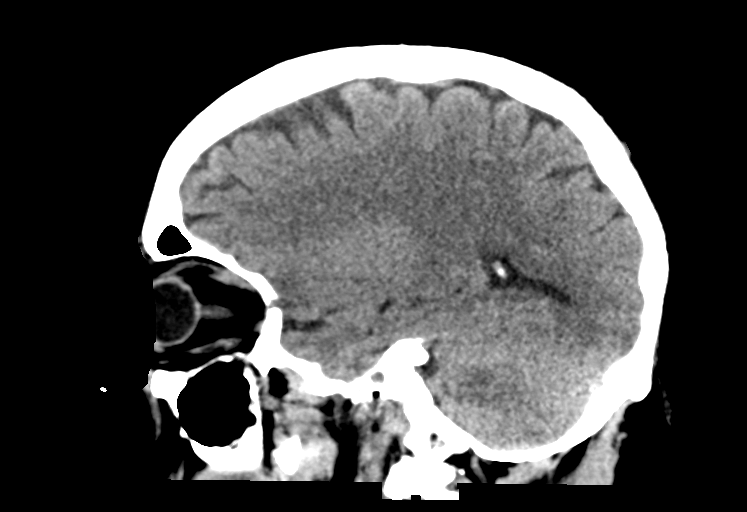

[15 of 47 positions shown; findings below may reference images not displayed]

FINDINGS: Brain: No evidence of acute infarction, hemorrhage, hydrocephalus,
extra-axial collection or mass lesion/mass effect.

Vascular: No hyperdense vessel or unexpected calcification.

Skull: Normal. Negative for fracture or focal lesion.

Sinuses/Orbits: The paranasal sinuses and mastoid air cells are
predominantly clear. Orbits are grossly unremarkable.

Other: Soft tissue density subcutaneous nodules measuring 8 mm at
the right vertex, and 1.3 cm at the left occiput.
IMPRESSION: 1. No acute intracranial abnormality.
2. Soft tissue density subcutaneous nodules measuring 8 mm at the
right vertex, and 1.3 cm at the left occiput. These are nonspecific
and may represent sebaceous cysts. Correlate with physical exam.

## 2022-01-29 IMAGING — DX DG NECK SOFT TISSUE
2 series · 2 of 2 positions shown · non-contrast
Comparison: None.

CLINICAL DATA: Neck pain

EXAM:
NECK SOFT TISSUES - 1+ VIEW

[neck ap]
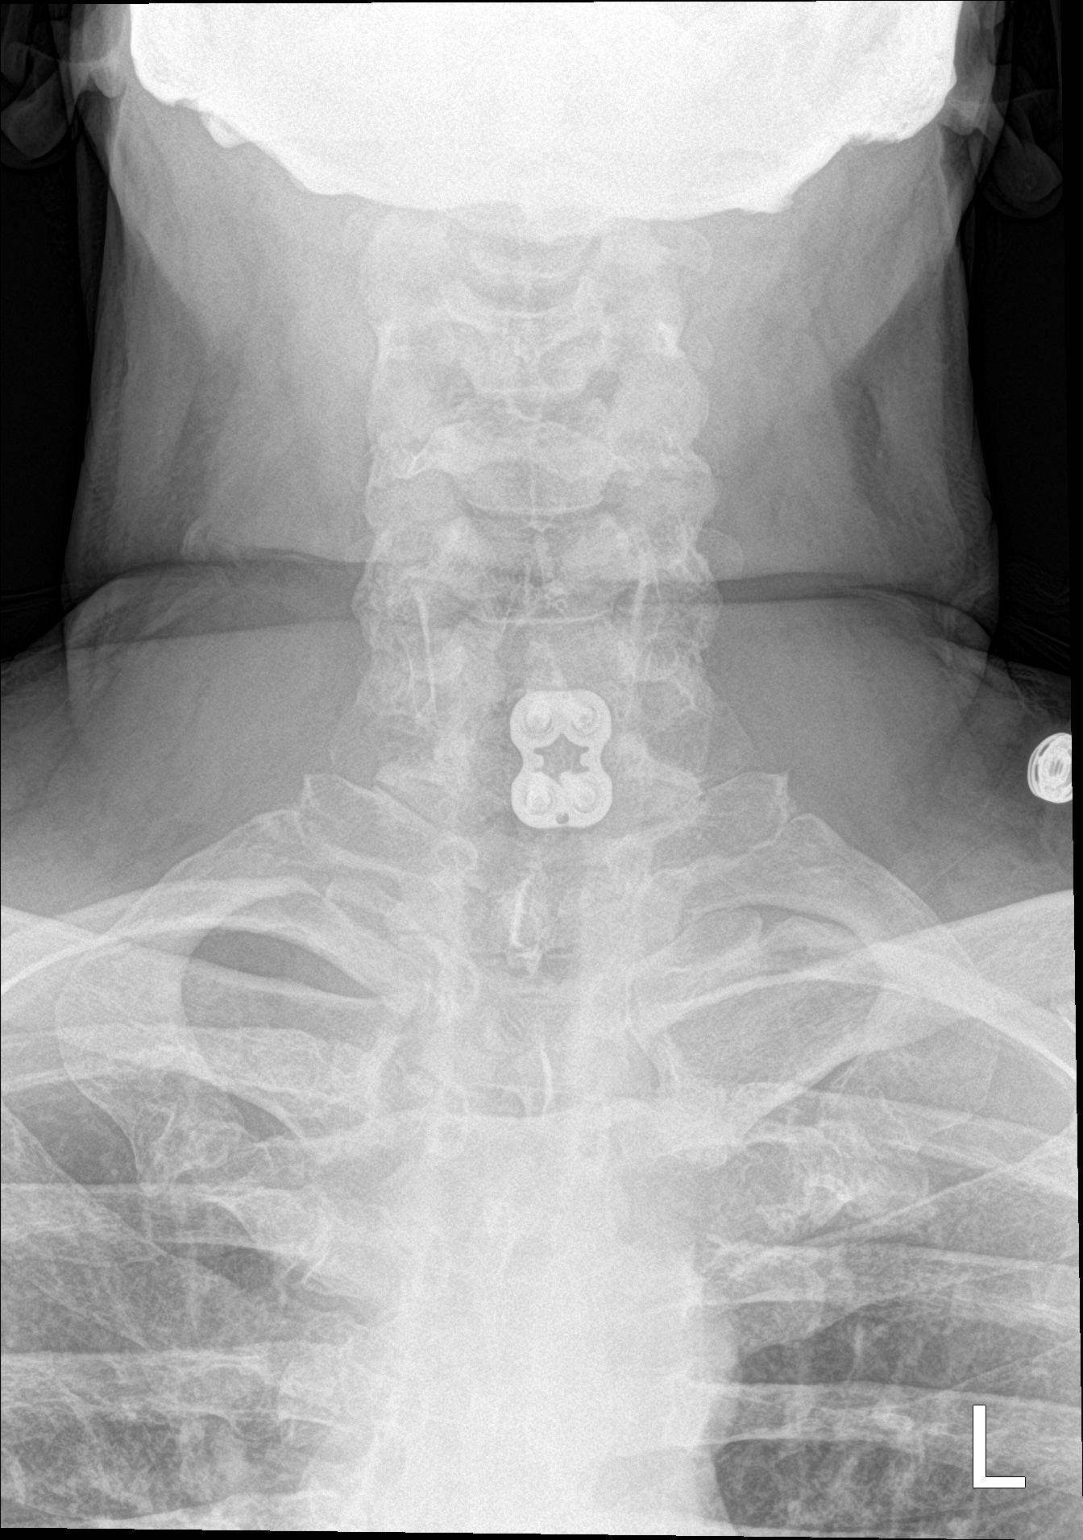

[neck lat]
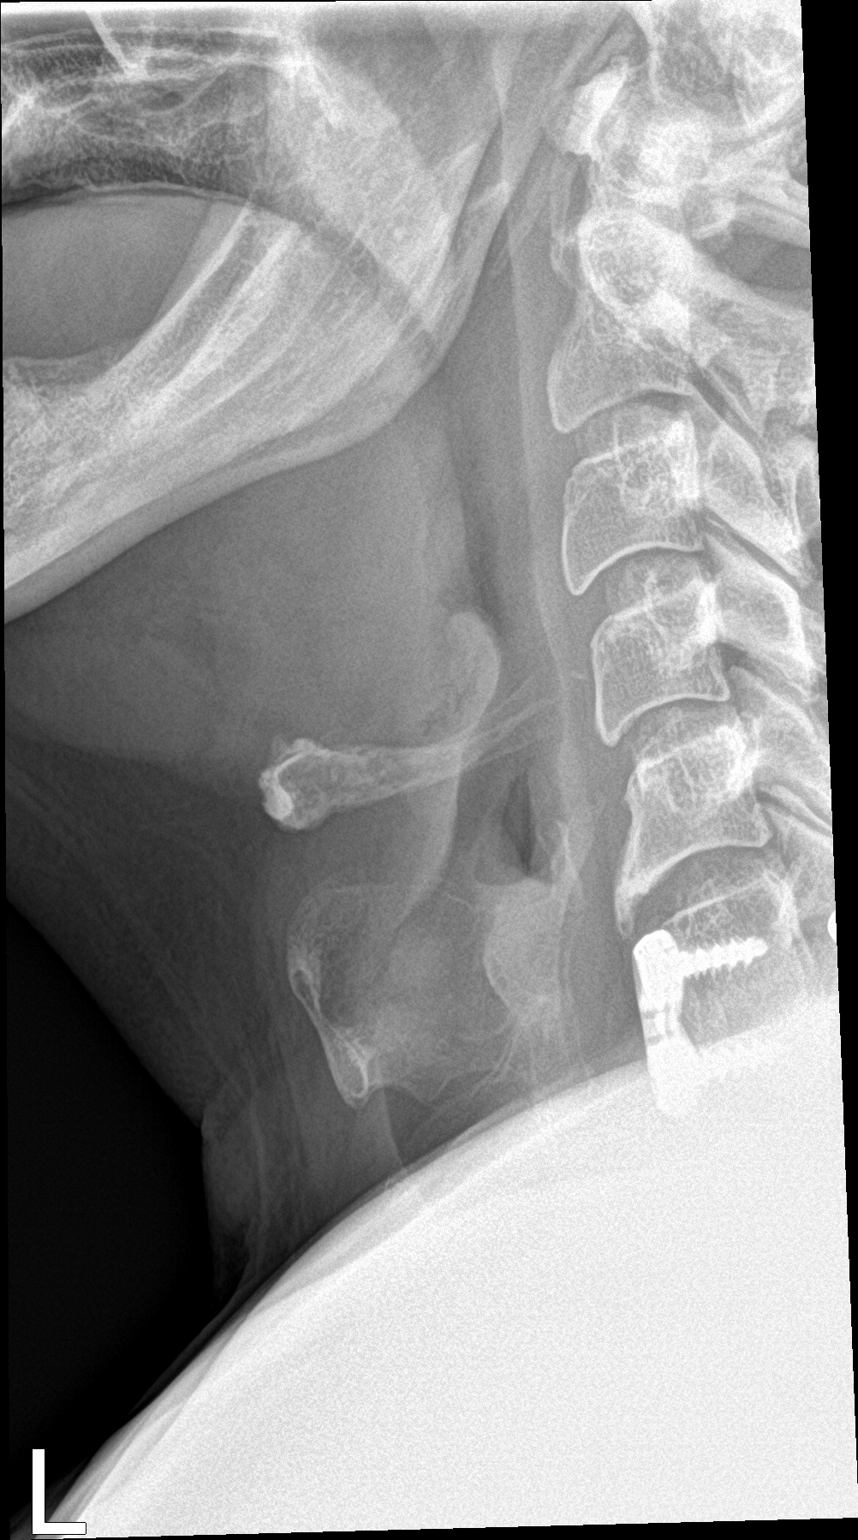

[2 of 2 positions shown; findings below may reference images not displayed]

FINDINGS: There is no evidence of retropharyngeal soft tissue swelling or
epiglottic enlargement. The cervical airway is unremarkable and no
radio-opaque foreign body identified. C6-7 anterior cervical
discectomy and fusion with instrumentation has been performed.
IMPRESSION: Negative.

## 2022-02-26 DIAGNOSIS — L02413 Cutaneous abscess of right upper limb: Secondary | ICD-10-CM | POA: Insufficient documentation

## 2022-03-15 DIAGNOSIS — L02413 Cutaneous abscess of right upper limb: Secondary | ICD-10-CM | POA: Diagnosis not present

## 2022-05-25 ENCOUNTER — Encounter (INDEPENDENT_AMBULATORY_CARE_PROVIDER_SITE_OTHER): Payer: Medicaid Other

## 2022-05-25 ENCOUNTER — Ambulatory Visit (INDEPENDENT_AMBULATORY_CARE_PROVIDER_SITE_OTHER): Payer: Medicaid Other | Admitting: Vascular Surgery

## 2022-06-07 ENCOUNTER — Other Ambulatory Visit (INDEPENDENT_AMBULATORY_CARE_PROVIDER_SITE_OTHER): Payer: Self-pay | Admitting: Vascular Surgery

## 2022-06-07 DIAGNOSIS — I739 Peripheral vascular disease, unspecified: Secondary | ICD-10-CM

## 2022-06-07 DIAGNOSIS — Z89512 Acquired absence of left leg below knee: Secondary | ICD-10-CM

## 2022-06-08 ENCOUNTER — Encounter (INDEPENDENT_AMBULATORY_CARE_PROVIDER_SITE_OTHER): Payer: Medicaid Other

## 2022-06-08 ENCOUNTER — Ambulatory Visit (INDEPENDENT_AMBULATORY_CARE_PROVIDER_SITE_OTHER): Payer: Medicaid Other | Admitting: Vascular Surgery

## 2022-07-04 DIAGNOSIS — G894 Chronic pain syndrome: Secondary | ICD-10-CM | POA: Diagnosis not present

## 2022-07-05 ENCOUNTER — Telehealth (INDEPENDENT_AMBULATORY_CARE_PROVIDER_SITE_OTHER): Payer: Self-pay | Admitting: Vascular Surgery

## 2022-07-05 NOTE — Telephone Encounter (Signed)
Called pt and LVM asking pt TCB to see what he needs.

## 2022-07-06 ENCOUNTER — Telehealth (INDEPENDENT_AMBULATORY_CARE_PROVIDER_SITE_OTHER): Payer: Self-pay | Admitting: Vascular Surgery

## 2022-07-06 NOTE — Telephone Encounter (Signed)
I'm assuming the wound is on his stump, not the prosthetic itself.  If it is on his stump we can bring it in to take a look

## 2022-07-06 NOTE — Telephone Encounter (Signed)
Past 2 weeks pt states that there is a spot coming up on prosthetic. Open wound about the size of a soda can - not bleeding or oozing - it is painful. Not re. States tissue is white around edges and pink in the middle.

## 2022-07-06 NOTE — Telephone Encounter (Signed)
No. It is not on his prosthetic. He said it was where his stump and prosthetic meet.

## 2022-07-12 ENCOUNTER — Ambulatory Visit
Admission: RE | Admit: 2022-07-12 | Discharge: 2022-07-12 | Disposition: A | Discharge status: Home / Self Care | Payer: Medicaid Other | Source: Ambulatory Visit | Attending: Nurse Practitioner | Admitting: Nurse Practitioner

## 2022-07-12 ENCOUNTER — Encounter (INDEPENDENT_AMBULATORY_CARE_PROVIDER_SITE_OTHER): Payer: Self-pay | Admitting: Nurse Practitioner

## 2022-07-12 ENCOUNTER — Ambulatory Visit
Admission: RE | Admit: 2022-07-12 | Discharge: 2022-07-12 | Disposition: A | Discharge status: Home / Self Care | Payer: Medicaid Other | Attending: Internal Medicine | Admitting: Internal Medicine

## 2022-07-12 ENCOUNTER — Ambulatory Visit (INDEPENDENT_AMBULATORY_CARE_PROVIDER_SITE_OTHER): Payer: Medicaid Other | Admitting: Nurse Practitioner

## 2022-07-12 VITALS — BP 163/94 | HR 92 | Resp 18 | Ht 70.0 in | Wt 211.0 lb

## 2022-07-12 DIAGNOSIS — Z89512 Acquired absence of left leg below knee: Secondary | ICD-10-CM

## 2022-07-12 DIAGNOSIS — E1165 Type 2 diabetes mellitus with hyperglycemia: Secondary | ICD-10-CM | POA: Insufficient documentation

## 2022-07-12 DIAGNOSIS — G5620 Lesion of ulnar nerve, unspecified upper limb: Secondary | ICD-10-CM | POA: Insufficient documentation

## 2022-07-12 DIAGNOSIS — G629 Polyneuropathy, unspecified: Secondary | ICD-10-CM | POA: Insufficient documentation

## 2022-07-12 DIAGNOSIS — R1013 Epigastric pain: Secondary | ICD-10-CM | POA: Insufficient documentation

## 2022-07-12 DIAGNOSIS — M7989 Other specified soft tissue disorders: Secondary | ICD-10-CM | POA: Diagnosis not present

## 2022-07-12 DIAGNOSIS — G5600 Carpal tunnel syndrome, unspecified upper limb: Secondary | ICD-10-CM | POA: Insufficient documentation

## 2022-07-12 DIAGNOSIS — K219 Gastro-esophageal reflux disease without esophagitis: Secondary | ICD-10-CM | POA: Insufficient documentation

## 2022-07-12 DIAGNOSIS — R112 Nausea with vomiting, unspecified: Secondary | ICD-10-CM | POA: Insufficient documentation

## 2022-07-12 MED ORDER — DOXYCYCLINE HYCLATE 100 MG PO CAPS: 100.0000 mg | ORAL_CAPSULE | Freq: Two times a day (BID) | ORAL | 0 refills | Status: DC

## 2022-07-24 DIAGNOSIS — E861 Hypovolemia: Secondary | ICD-10-CM | POA: Diagnosis not present

## 2022-07-24 DIAGNOSIS — F419 Anxiety disorder, unspecified: Secondary | ICD-10-CM | POA: Diagnosis not present

## 2022-07-24 DIAGNOSIS — K5730 Diverticulosis of large intestine without perforation or abscess without bleeding: Secondary | ICD-10-CM | POA: Diagnosis not present

## 2022-07-24 DIAGNOSIS — Z794 Long term (current) use of insulin: Secondary | ICD-10-CM | POA: Diagnosis not present

## 2022-07-24 DIAGNOSIS — S0990XA Unspecified injury of head, initial encounter: Secondary | ICD-10-CM | POA: Diagnosis not present

## 2022-07-24 DIAGNOSIS — K529 Noninfective gastroenteritis and colitis, unspecified: Secondary | ICD-10-CM | POA: Diagnosis not present

## 2022-07-24 DIAGNOSIS — R531 Weakness: Secondary | ICD-10-CM | POA: Diagnosis not present

## 2022-07-24 DIAGNOSIS — M542 Cervicalgia: Secondary | ICD-10-CM | POA: Diagnosis not present

## 2022-07-24 DIAGNOSIS — I129 Hypertensive chronic kidney disease with stage 1 through stage 4 chronic kidney disease, or unspecified chronic kidney disease: Secondary | ICD-10-CM | POA: Diagnosis not present

## 2022-07-24 DIAGNOSIS — K5989 Other specified functional intestinal disorders: Secondary | ICD-10-CM | POA: Diagnosis not present

## 2022-07-24 DIAGNOSIS — E860 Dehydration: Secondary | ICD-10-CM | POA: Diagnosis not present

## 2022-07-24 DIAGNOSIS — N1832 Chronic kidney disease, stage 3b: Secondary | ICD-10-CM | POA: Diagnosis not present

## 2022-07-24 DIAGNOSIS — L03115 Cellulitis of right lower limb: Secondary | ICD-10-CM | POA: Diagnosis not present

## 2022-07-24 DIAGNOSIS — F329 Major depressive disorder, single episode, unspecified: Secondary | ICD-10-CM | POA: Diagnosis not present

## 2022-07-24 DIAGNOSIS — N179 Acute kidney failure, unspecified: Secondary | ICD-10-CM | POA: Diagnosis not present

## 2022-07-24 DIAGNOSIS — K567 Ileus, unspecified: Secondary | ICD-10-CM | POA: Diagnosis not present

## 2022-07-24 DIAGNOSIS — G4733 Obstructive sleep apnea (adult) (pediatric): Secondary | ICD-10-CM | POA: Diagnosis not present

## 2022-07-24 DIAGNOSIS — Z9989 Dependence on other enabling machines and devices: Secondary | ICD-10-CM | POA: Diagnosis not present

## 2022-07-24 DIAGNOSIS — E1142 Type 2 diabetes mellitus with diabetic polyneuropathy: Secondary | ICD-10-CM | POA: Diagnosis not present

## 2022-07-24 DIAGNOSIS — Z981 Arthrodesis status: Secondary | ICD-10-CM | POA: Diagnosis not present

## 2022-07-25 DIAGNOSIS — I129 Hypertensive chronic kidney disease with stage 1 through stage 4 chronic kidney disease, or unspecified chronic kidney disease: Secondary | ICD-10-CM | POA: Diagnosis not present

## 2022-07-25 DIAGNOSIS — N179 Acute kidney failure, unspecified: Secondary | ICD-10-CM | POA: Diagnosis not present

## 2022-07-25 DIAGNOSIS — Z794 Long term (current) use of insulin: Secondary | ICD-10-CM | POA: Diagnosis not present

## 2022-07-25 DIAGNOSIS — F329 Major depressive disorder, single episode, unspecified: Secondary | ICD-10-CM | POA: Diagnosis not present

## 2022-07-25 DIAGNOSIS — E860 Dehydration: Secondary | ICD-10-CM | POA: Diagnosis not present

## 2022-07-25 DIAGNOSIS — N1832 Chronic kidney disease, stage 3b: Secondary | ICD-10-CM | POA: Diagnosis not present

## 2022-07-25 DIAGNOSIS — E861 Hypovolemia: Secondary | ICD-10-CM | POA: Diagnosis not present

## 2022-07-25 DIAGNOSIS — F419 Anxiety disorder, unspecified: Secondary | ICD-10-CM | POA: Diagnosis not present

## 2022-07-25 DIAGNOSIS — K529 Noninfective gastroenteritis and colitis, unspecified: Secondary | ICD-10-CM | POA: Diagnosis not present

## 2022-07-25 DIAGNOSIS — K3189 Other diseases of stomach and duodenum: Secondary | ICD-10-CM | POA: Diagnosis not present

## 2022-07-25 DIAGNOSIS — K6389 Other specified diseases of intestine: Secondary | ICD-10-CM | POA: Diagnosis not present

## 2022-07-25 DIAGNOSIS — G4733 Obstructive sleep apnea (adult) (pediatric): Secondary | ICD-10-CM | POA: Diagnosis not present

## 2022-07-25 DIAGNOSIS — Z9989 Dependence on other enabling machines and devices: Secondary | ICD-10-CM | POA: Diagnosis not present

## 2022-07-25 DIAGNOSIS — E1142 Type 2 diabetes mellitus with diabetic polyneuropathy: Secondary | ICD-10-CM | POA: Diagnosis not present

## 2022-07-25 DIAGNOSIS — R0600 Dyspnea, unspecified: Secondary | ICD-10-CM | POA: Diagnosis not present

## 2022-07-26 ENCOUNTER — Ambulatory Visit (INDEPENDENT_AMBULATORY_CARE_PROVIDER_SITE_OTHER): Payer: Medicaid Other | Admitting: Nurse Practitioner

## 2022-07-26 DIAGNOSIS — G4733 Obstructive sleep apnea (adult) (pediatric): Secondary | ICD-10-CM | POA: Diagnosis not present

## 2022-07-26 DIAGNOSIS — Z720 Tobacco use: Secondary | ICD-10-CM | POA: Diagnosis not present

## 2022-07-26 DIAGNOSIS — N179 Acute kidney failure, unspecified: Secondary | ICD-10-CM | POA: Diagnosis not present

## 2022-07-26 DIAGNOSIS — Z794 Long term (current) use of insulin: Secondary | ICD-10-CM | POA: Diagnosis not present

## 2022-07-26 DIAGNOSIS — K567 Ileus, unspecified: Secondary | ICD-10-CM | POA: Diagnosis not present

## 2022-07-26 DIAGNOSIS — E861 Hypovolemia: Secondary | ICD-10-CM | POA: Diagnosis not present

## 2022-07-26 DIAGNOSIS — N1832 Chronic kidney disease, stage 3b: Secondary | ICD-10-CM | POA: Diagnosis not present

## 2022-07-26 DIAGNOSIS — E1142 Type 2 diabetes mellitus with diabetic polyneuropathy: Secondary | ICD-10-CM | POA: Diagnosis not present

## 2022-07-26 DIAGNOSIS — K529 Noninfective gastroenteritis and colitis, unspecified: Secondary | ICD-10-CM | POA: Diagnosis not present

## 2022-07-26 DIAGNOSIS — I959 Hypotension, unspecified: Secondary | ICD-10-CM | POA: Diagnosis not present

## 2022-07-26 DIAGNOSIS — Z9989 Dependence on other enabling machines and devices: Secondary | ICD-10-CM | POA: Diagnosis not present

## 2022-07-26 DIAGNOSIS — E860 Dehydration: Secondary | ICD-10-CM | POA: Diagnosis not present

## 2022-07-27 DIAGNOSIS — E860 Dehydration: Secondary | ICD-10-CM | POA: Diagnosis not present

## 2022-07-27 DIAGNOSIS — K529 Noninfective gastroenteritis and colitis, unspecified: Secondary | ICD-10-CM | POA: Diagnosis not present

## 2022-07-27 DIAGNOSIS — G4733 Obstructive sleep apnea (adult) (pediatric): Secondary | ICD-10-CM | POA: Diagnosis not present

## 2022-07-27 DIAGNOSIS — K567 Ileus, unspecified: Secondary | ICD-10-CM | POA: Diagnosis not present

## 2022-07-27 DIAGNOSIS — Z794 Long term (current) use of insulin: Secondary | ICD-10-CM | POA: Diagnosis not present

## 2022-07-27 DIAGNOSIS — E1142 Type 2 diabetes mellitus with diabetic polyneuropathy: Secondary | ICD-10-CM | POA: Diagnosis not present

## 2022-07-27 DIAGNOSIS — Z9989 Dependence on other enabling machines and devices: Secondary | ICD-10-CM | POA: Diagnosis not present

## 2022-07-27 DIAGNOSIS — I959 Hypotension, unspecified: Secondary | ICD-10-CM | POA: Diagnosis not present

## 2022-07-27 DIAGNOSIS — E861 Hypovolemia: Secondary | ICD-10-CM | POA: Diagnosis not present

## 2022-07-27 DIAGNOSIS — N1832 Chronic kidney disease, stage 3b: Secondary | ICD-10-CM | POA: Diagnosis not present

## 2022-07-27 DIAGNOSIS — Z720 Tobacco use: Secondary | ICD-10-CM | POA: Diagnosis not present

## 2022-07-27 DIAGNOSIS — N179 Acute kidney failure, unspecified: Secondary | ICD-10-CM | POA: Diagnosis not present

## 2022-07-30 ENCOUNTER — Encounter (INDEPENDENT_AMBULATORY_CARE_PROVIDER_SITE_OTHER): Payer: Self-pay | Admitting: Nurse Practitioner

## 2022-07-30 NOTE — Progress Notes (Signed)
Subjective:    Patient ID: Richard Mendoza, male    DOB: 10-Oct-1976, 46 y.o.   MRN: [id number] Chief Complaint  Patient presents with   Follow-up    Evaluate stump    The patient is a 46 year old male with a previous history of a left below-knee amputation.  He notes that he began to have an open wound and ulcerated area on his stump.  He notes that he works and stands for long periods and that his socket has begun to lose some of the padding internally.  He believes that this is contributed to the wound formation.  There is also a soft area that is concerning.  The area is slightly larger than a nickel but with no significant draining.    Review of Systems  Skin:  Positive for wound.  All other systems reviewed and are negative.      Objective:   Physical Exam Vitals reviewed.  HENT:     Head: Normocephalic.  Cardiovascular:     Rate and Rhythm: Normal rate.  Pulmonary:     Effort: Pulmonary effort is normal.  Musculoskeletal:     Left Lower Extremity: Left leg is amputated below knee.  Skin:    General: Skin is warm and dry.     Comments: Ulcer on distal portion of left residual limb  Neurological:     Mental Status: He is alert and oriented to person, place, and time.  Psychiatric:        Mood and Affect: Mood normal.        Behavior: Behavior normal.        Thought Content: Thought content normal.        Judgment: Judgment normal.     BP (!) 163/94 (BP Location: Left Arm)   Pulse 92   Resp 18   Ht 5' 10"  (1.778 m)   Wt 211 lb (95.7 kg)   BMI 30.28 kg/m   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    Social History   Socioeconomic History   Marital status: Married    Spouse name: Not on file   Number of children: 4   Years of education: Not on file   Highest education level: Not on file  Occupational  History   Not on file  Tobacco Use   Smoking status: Former    Packs/day: 1.00    Years: 17.00    Total pack years: 17.00    Types: Cigarettes    Quit date: 01/14/2019    Years since quitting: 3.5   Smokeless tobacco: Never  Vaping Use   Vaping Use: Never used  Substance and Sexual Activity   Alcohol use: Yes    Comment: rare   Drug use: No   Sexual activity: Yes  Other Topics Concern   Not on file  Social History Narrative   Not on file   Social Determinants of Health   Financial Resource Strain: Not on file  Food Insecurity: Not on file  Transportation Needs: Not on file  Physical Activity: Not on file  Stress: Not on file  Social Connections: Not on file  Intimate Partner Violence: Not on file    Past Surgical History:  Procedure Laterality Date   AMPUTATION Left 11/07/2020   Procedure: 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;  Surgeon: Sharlotte Alamo, DPM;  Location: [facility];  Service: Podiatry;  Laterality: Left;   AMPUTATION Left 11/16/2020   Procedure: AMPUTATION BELOW KNEE;  Surgeon: Algernon Huxley, MD;  Location: [facility];  Service: Vascular;  Laterality: Left;   ANTERIOR CERVICAL DECOMP/DISCECTOMY FUSION N/A 09/09/2017   Procedure: ANTERIOR CERVICAL DECOMPRESSION/DISCECTOMY FUSION CERVICAL 6- CERVICAL 7;  Surgeon: Ashok Pall, MD;  Location: [place];  Service: Neurosurgery;  Laterality: N/A;  ANTERIOR CERVICAL DECOMPRESSION/DISCECTOMY FUSION CERVICAL 6- CERVICAL 7   APPENDECTOMY     I & D EXTREMITY Right 10/03/2017   Procedure: IRRIGATION AND DEBRIDEMENT RIGHT WRIST;  Surgeon: Leanora Cover, MD;  Location: [place];  Service: Orthopedics;  Laterality: Right;   I & D EXTREMITY Right 11/26/2018   Procedure: IRRIGATION AND DEBRIDEMENT FASCIA ON RIGHT FOOT;  Surgeon: Samara Deist, DPM;  Location: [facility];  Service: Podiatry;  Laterality: Right;   INCISION AND DRAINAGE Right 03/06/2019   Procedure: INCISION AND DRAINAGE RIGHT FOOT, WITH 4th RAY AMPUTATION;   Surgeon: Samara Deist, DPM;  Location: [facility];  Service: Podiatry;  Laterality: Right;   INCISION AND DRAINAGE Left 11/07/2020   Procedure: INCISION AND DRAINAGE;  Surgeon: Sharlotte Alamo, DPM;  Location: [facility];  Service: Podiatry;  Laterality: Left;   IRRIGATION AND DEBRIDEMENT FOOT Left 11/11/2020   Procedure: IRRIGATION AND DEBRIDEMENT FOOT;  Surgeon: Sharlotte Alamo, DPM;  Location: [facility];  Service: Podiatry;  Laterality: Left;   METATARSAL HEAD EXCISION Right 05/15/2019   Procedure: OSTECTOMY;MET HEAD 5;  Surgeon: Samara Deist, DPM;  Location: [facility];  Service: Podiatry;  Laterality: Right;   osteomylitis     ROTATOR CUFF REPAIR Left     Family History  Problem Relation Age of Onset   Diabetes Mother    Heart disease Father    Diabetes Father    Arthritis Other    Hyperlipidemia Other    Hypertension Other    Cancer Other        breast   Mental illness Neg Hx     No Known Allergies     Latest Ref Rng & Units 09/08/2021    3:54 AM 09/07/2021    6:05 AM 09/06/2021    5:08 PM  CBC  WBC 4.0 - 10.5 K/uL 10.8  11.8  13.9   Hemoglobin 13.0 - 17.0 g/dL 8.9  9.6  10.7   Hematocrit 39.0 - 52.0 % 26.9  28.4  30.9   Platelets 150 - 400 K/uL 294  300  322       CMP     Component Value Date/Time   NA 135 09/08/2021 0354   K 2.9 (L) 09/08/2021 0354   CL 110 09/08/2021 0354   CO2 22 09/08/2021 0354   GLUCOSE 86 09/08/2021 0354   BUN 18 09/08/2021 0354   CREATININE 1.19 09/08/2021 0354   CALCIUM 8.5 (L) 09/08/2021 0354   PROT 6.2 (L) 09/07/2021 0605   ALBUMIN 2.3 (L) 09/07/2021 0605   AST 13 (L) 09/07/2021 0605   ALT 12 09/07/2021 0605   ALKPHOS 75 09/07/2021 0605   BILITOT 0.3 09/07/2021 0605   GFRNONAA >60 09/08/2021 0354   GFRAA >60 05/05/2020 1930     No results found.     Assessment & Plan:   1. S/P BKA (below knee amputation), left ([place]) We will have the patient have an x-ray to ensure that there is no evidence of abscess in the area.  I will also  prescribe doxycycline.  Patient should cover the area with Xeroform as well  as try to pad when he is using it.  Optimally the patient was not utilize his stump to allow this to heal but this is not feasible due to him working.  We will also reach out to [facility] to see about obtaining a new socket as his appears to be worn out which may be causing the wound itself. - DG Tibia/Fibula Left; Future   Current Outpatient Medications on File Prior to Visit  Medication Sig Dispense Refill   atorvastatin (LIPITOR) 40 MG tablet Take 40 mg by mouth daily.   3   benazepril (LOTENSIN) 20 MG tablet Take 20 mg by mouth daily.     busPIRone (BUSPAR) 15 MG tablet Take 15 mg by mouth 2 (two) times daily.     collagenase (SANTYL) ointment Apply topically daily. 15 g 0   [facility] SENNA 8.6 MG tablet Take 1 tablet by mouth 2 (two) times daily as needed for constipation.  5   dicyclomine (BENTYL) 10 MG capsule Take 10 mg by mouth in the morning and at bedtime.     famotidine (PEPCID) 20 MG tablet Take 20 mg by mouth daily.     FIASP 100 UNIT/ML SOLN Inject into the skin continuous. Via pump     gabapentin (NEURONTIN) 300 MG capsule Take 1 capsule (300 mg total) by mouth 2 (two) times daily with breakfast and lunch AND 2 capsules (600 mg total) at bedtime. (Patient taking differently: 955m 2 times a day) 120 capsule 3   linaclotide (LINZESS) 72 MCG capsule Take 72 mcg by mouth daily as needed (constipation).     methocarbamol (ROBAXIN) 500 MG tablet Take 2 tablets by mouth 2 (two) times daily as needed for muscle spasms.     metoCLOPramide (REGLAN) 10 MG tablet Take 10 mg by mouth daily.     ondansetron (ZOFRAN) 8 MG tablet Take 8 mg by mouth every 6 (six) hours as needed for nausea/vomiting.     oxyCODONE (OXY IR/ROXICODONE) 5 MG immediate release tablet Take 1 tablet (5 mg total) by mouth every 4 (four) hours as needed for moderate pain or severe pain. 28 tablet 0   pantoprazole (PROTONIX) 40 MG tablet Take 40 mg  by mouth daily.  5   sertraline (ZOLOFT) 100 MG tablet Take 100 mg by mouth daily.   5   sildenafil (REVATIO) 20 MG tablet Take 100 mg by mouth daily as needed (erectile dysfunction).     traZODone (DESYREL) 100 MG tablet Take 100 mg by mouth at bedtime.     vitamin B-12 (CYANOCOBALAMIN) 1000 MCG tablet Take 1,000 mcg by mouth in the morning and at bedtime.      No current facility-administered medications on file prior to visit.    There are no Patient Instructions on file for this visit. No follow-ups on file.   FKris Hartmann NP

## 2022-09-17 ENCOUNTER — Encounter (INDEPENDENT_AMBULATORY_CARE_PROVIDER_SITE_OTHER): Payer: Self-pay

## 2022-10-26 ENCOUNTER — Ambulatory Visit (INDEPENDENT_AMBULATORY_CARE_PROVIDER_SITE_OTHER): Payer: Medicaid Other | Admitting: Nurse Practitioner

## 2022-10-26 ENCOUNTER — Encounter (INDEPENDENT_AMBULATORY_CARE_PROVIDER_SITE_OTHER): Payer: Self-pay | Admitting: Nurse Practitioner

## 2022-10-26 VITALS — BP 202/130 | HR 97 | Resp 16 | Wt 209.8 lb

## 2022-10-26 DIAGNOSIS — Z89512 Acquired absence of left leg below knee: Secondary | ICD-10-CM

## 2022-10-26 MED ORDER — PREGABALIN 75 MG PO CAPS: 75.0000 mg | ORAL_CAPSULE | Freq: Two times a day (BID) | ORAL | 3 refills | Status: DC

## 2022-11-09 ENCOUNTER — Ambulatory Visit (INDEPENDENT_AMBULATORY_CARE_PROVIDER_SITE_OTHER): Payer: Medicaid Other | Admitting: Nurse Practitioner

## 2022-11-13 ENCOUNTER — Encounter (INDEPENDENT_AMBULATORY_CARE_PROVIDER_SITE_OTHER): Payer: Self-pay | Admitting: Nurse Practitioner

## 2022-11-13 NOTE — Progress Notes (Signed)
Subjective:    Patient ID: Richard Mendoza, male    DOB: [birth year], 46 y.o.   MRN: [id number] Chief Complaint  Patient presents with   Follow-up    2 week follow up    The patient is a 46 year old male with a previous history of a left below-knee amputation.  He notes that he began to have an open wound and ulcerated area on his stump.  He notes that he works and stands for long periods and that his socket has begun to lose some of the padding internally.  He believes that this is contributed to the wound formation.  There is also a soft area that is concerning.  The area is slightly larger than a nickel but with no significant draining.  Initially the patient was seen in August for this issue however he has had some multiple hospitalizations and has not been able to return for follow-up.  The wound continues and there is also a small area concerning for possible new wound formation.    Review of Systems  Skin:  Positive for wound.  All other systems reviewed and are negative.      Objective:   Physical Exam Vitals reviewed.  HENT:     Head: Normocephalic.  Cardiovascular:     Rate and Rhythm: Normal rate.  Pulmonary:     Effort: Pulmonary effort is normal.  Musculoskeletal:     Right Lower Extremity: Right leg is amputated below knee.  Skin:    General: Skin is warm and dry.  Neurological:     Mental Status: He is alert and oriented to person, place, and time.  Psychiatric:        Mood and Affect: Mood normal.        Behavior: Behavior normal.        Thought Content: Thought content normal.        Judgment: Judgment normal.     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    Social History   Socioeconomic  History   Marital status: Married    Spouse name: Not on file   Number of children: 4   Years of education: Not on file   Highest education level: Not on file  Occupational History   Not on file  Tobacco Use   Smoking status: Former    Packs/day: 1.00    Years: 17.00    Total pack years: 17.00    Types: Cigarettes    Quit date: 01/14/2019    Years since quitting: 3.8   Smokeless tobacco: Never  Vaping Use   Vaping Use: Never used  Substance and Sexual Activity   Alcohol use: Yes    Comment: rare   Drug use: No   Sexual activity: Yes  Other Topics Concern   Not on file  Social History Narrative   Not on file   Social Determinants of Health   Financial Resource Strain: Not on file  Food Insecurity: Not on file  Transportation Needs: Not on file  Physical Activity: Not on file  Stress: Not on file  Social Connections: Not on file  Intimate Partner Violence: Not on file    Past  Surgical History:  Procedure Laterality Date   AMPUTATION Left 11/07/2020   Procedure: 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;  Surgeon: Sharlotte Alamo, DPM;  Location: [facility];  Service: Podiatry;  Laterality: Left;   AMPUTATION Left 11/16/2020   Procedure: AMPUTATION BELOW KNEE;  Surgeon: Algernon Huxley, MD;  Location: [facility];  Service: Vascular;  Laterality: Left;   ANTERIOR CERVICAL DECOMP/DISCECTOMY FUSION N/A 09/09/2017   Procedure: ANTERIOR CERVICAL DECOMPRESSION/DISCECTOMY FUSION CERVICAL 6- CERVICAL 7;  Surgeon: Ashok Pall, MD;  Location: [place];  Service: Neurosurgery;  Laterality: N/A;  ANTERIOR CERVICAL DECOMPRESSION/DISCECTOMY FUSION CERVICAL 6- CERVICAL 7   APPENDECTOMY     I & D EXTREMITY Right 10/03/2017   Procedure: IRRIGATION AND DEBRIDEMENT RIGHT WRIST;  Surgeon: Leanora Cover, MD;  Location: [place];  Service: Orthopedics;  Laterality: Right;   I & D EXTREMITY Right 11/26/2018   Procedure: IRRIGATION AND DEBRIDEMENT FASCIA ON RIGHT FOOT;  Surgeon: Samara Deist, DPM;   Location: [facility];  Service: Podiatry;  Laterality: Right;   INCISION AND DRAINAGE Right 03/06/2019   Procedure: INCISION AND DRAINAGE RIGHT FOOT, WITH 4th RAY AMPUTATION;  Surgeon: Samara Deist, DPM;  Location: [facility];  Service: Podiatry;  Laterality: Right;   INCISION AND DRAINAGE Left 11/07/2020   Procedure: INCISION AND DRAINAGE;  Surgeon: Sharlotte Alamo, DPM;  Location: [facility];  Service: Podiatry;  Laterality: Left;   IRRIGATION AND DEBRIDEMENT FOOT Left 11/11/2020   Procedure: IRRIGATION AND DEBRIDEMENT FOOT;  Surgeon: Sharlotte Alamo, DPM;  Location: [facility];  Service: Podiatry;  Laterality: Left;   METATARSAL HEAD EXCISION Right 05/15/2019   Procedure: OSTECTOMY;MET HEAD 5;  Surgeon: Samara Deist, DPM;  Location: [facility];  Service: Podiatry;  Laterality: Right;   osteomylitis     ROTATOR CUFF REPAIR Left     Family History  Problem Relation Age of Onset   Diabetes Mother    Heart disease Father    Diabetes Father    Arthritis Other    Hyperlipidemia Other    Hypertension Other    Cancer Other        breast   Mental illness Neg Hx     No Known Allergies     Latest Ref Rng & Units 09/08/2021    3:54 AM 09/07/2021    6:05 AM 09/06/2021    5:08 PM  CBC  WBC 4.0 - 10.5 K/uL 10.8  11.8  13.9   Hemoglobin 13.0 - 17.0 g/dL 8.9  9.6  10.7   Hematocrit 39.0 - 52.0 % 26.9  28.4  30.9   Platelets 150 - 400 K/uL 294  300  322       CMP     Component Value Date/Time   NA 135 09/08/2021 0354   K 2.9 (L) 09/08/2021 0354   CL 110 09/08/2021 0354   CO2 22 09/08/2021 0354   GLUCOSE 86 09/08/2021 0354   BUN 18 09/08/2021 0354   CREATININE 1.19 09/08/2021 0354   CALCIUM 8.5 (L) 09/08/2021 0354   PROT 6.2 (L) 09/07/2021 0605   ALBUMIN 2.3 (L) 09/07/2021 0605   AST 13 (L) 09/07/2021 0605   ALT 12 09/07/2021 0605   ALKPHOS 75 09/07/2021 0605   BILITOT 0.3 09/07/2021 0605   GFRNONAA >60 09/08/2021 0354   GFRAA >60 05/05/2020 1930     No results found.      Assessment & Plan:   1. S/P BKA (below knee amputation), left ([place]) Today patient's wound dressed with Aquacel and gauze.  We have also given a prescription for a new prosthetic sleeve to the patient we will also fax the prescription to [facility] as well.  Will have patient return in 2 weeks to reevaluate wound areas.  Depending on the level of progression with wound healing we may also discuss possible referral to wound care.   Current Outpatient Medications on File Prior to Visit  Medication Sig Dispense Refill   FIASP 100 UNIT/ML SOLN Inject into the skin continuous. Via pump     atorvastatin (LIPITOR) 40 MG tablet Take 40 mg by mouth daily.  (Patient not taking: Reported on 10/26/2022)  3   benazepril (LOTENSIN) 20 MG tablet Take 20 mg by mouth daily. (Patient not taking: Reported on 10/26/2022)     busPIRone (BUSPAR) 15 MG tablet Take 15 mg by mouth 2 (two) times daily. (Patient not taking: Reported on 10/26/2022)     collagenase (SANTYL) ointment Apply topically daily. (Patient not taking: Reported on 10/26/2022) 15 g 0   [facility] SENNA 8.6 MG tablet Take 1 tablet by mouth 2 (two) times daily as needed for constipation. (Patient not taking: Reported on 10/26/2022)  5   dicyclomine (BENTYL) 10 MG capsule Take 10 mg by mouth in the morning and at bedtime. (Patient not taking: Reported on 10/26/2022)     doxycycline (VIBRAMYCIN) 100 MG capsule Take 1 capsule (100 mg total) by mouth 2 (two) times daily. (Patient not taking: Reported on 10/26/2022) 20 capsule 0   famotidine (PEPCID) 20 MG tablet Take 20 mg by mouth daily. (Patient not taking: Reported on 10/26/2022)     gabapentin (NEURONTIN) 300 MG capsule Take 1 capsule (300 mg total) by mouth 2 (two) times daily with breakfast and lunch AND 2 capsules (600 mg total) at bedtime. (Patient not taking: Reported on 10/26/2022) 120 capsule 3   linaclotide (LINZESS) 72 MCG capsule Take 72 mcg by mouth daily as needed (constipation). (Patient not taking: Reported on  10/26/2022)     methocarbamol (ROBAXIN) 500 MG tablet Take 2 tablets by mouth 2 (two) times daily as needed for muscle spasms. (Patient not taking: Reported on 10/26/2022)     metoCLOPramide (REGLAN) 10 MG tablet Take 10 mg by mouth daily. (Patient not taking: Reported on 10/26/2022)     ondansetron (ZOFRAN) 8 MG tablet Take 8 mg by mouth every 6 (six) hours as needed for nausea/vomiting. (Patient not taking: Reported on 10/26/2022)     oxyCODONE (OXY IR/ROXICODONE) 5 MG immediate release tablet Take 1 tablet (5 mg total) by mouth every 4 (four) hours as needed for moderate pain or severe pain. (Patient not taking: Reported on 10/26/2022) 28 tablet 0   pantoprazole (PROTONIX) 40 MG tablet Take 40 mg by mouth daily. (Patient not taking: Reported on 10/26/2022)  5   sertraline (ZOLOFT) 100 MG tablet Take 100 mg by mouth daily.  (Patient not taking: Reported on 10/26/2022)  5   sildenafil (REVATIO) 20 MG tablet Take 100 mg by mouth daily as needed (erectile dysfunction). (Patient not taking: Reported on 10/26/2022)     traZODone (DESYREL) 100 MG tablet Take 100 mg by mouth at bedtime. (Patient not taking: Reported on 10/26/2022)     vitamin B-12 (CYANOCOBALAMIN) 1000 MCG tablet Take 1,000 mcg by mouth in the morning and at bedtime.  (Patient not taking: Reported on 10/26/2022)     No current facility-administered medications on file prior to visit.    There are no Patient Instructions on file for this visit. No follow-ups on file.  Kris Hartmann, NP

## 2022-11-25 ENCOUNTER — Emergency Department (HOSPITAL_COMMUNITY): Payer: Medicaid Other

## 2022-11-25 ENCOUNTER — Encounter (HOSPITAL_COMMUNITY): Payer: Self-pay

## 2022-11-25 ENCOUNTER — Emergency Department (HOSPITAL_COMMUNITY)
Admission: EM | Admit: 2022-11-25 | Discharge: 2022-11-25 | Disposition: A | Discharge status: Home / Self Care | Payer: Medicaid Other | Attending: Emergency Medicine | Admitting: Emergency Medicine

## 2022-11-25 DIAGNOSIS — N1832 Chronic kidney disease, stage 3b: Secondary | ICD-10-CM | POA: Diagnosis not present

## 2022-11-25 DIAGNOSIS — S0990XA Unspecified injury of head, initial encounter: Secondary | ICD-10-CM | POA: Diagnosis not present

## 2022-11-25 DIAGNOSIS — R030 Elevated blood-pressure reading, without diagnosis of hypertension: Secondary | ICD-10-CM

## 2022-11-25 DIAGNOSIS — I129 Hypertensive chronic kidney disease with stage 1 through stage 4 chronic kidney disease, or unspecified chronic kidney disease: Secondary | ICD-10-CM | POA: Diagnosis not present

## 2022-11-25 DIAGNOSIS — E1165 Type 2 diabetes mellitus with hyperglycemia: Secondary | ICD-10-CM | POA: Diagnosis not present

## 2022-11-25 DIAGNOSIS — Z79899 Other long term (current) drug therapy: Secondary | ICD-10-CM | POA: Insufficient documentation

## 2022-11-25 DIAGNOSIS — M79671 Pain in right foot: Secondary | ICD-10-CM | POA: Insufficient documentation

## 2022-11-25 DIAGNOSIS — Y92410 Unspecified street and highway as the place of occurrence of the external cause: Secondary | ICD-10-CM | POA: Diagnosis not present

## 2022-11-25 DIAGNOSIS — M5136 Other intervertebral disc degeneration, lumbar region: Secondary | ICD-10-CM | POA: Diagnosis not present

## 2022-11-25 DIAGNOSIS — M25552 Pain in left hip: Secondary | ICD-10-CM | POA: Diagnosis not present

## 2022-11-25 DIAGNOSIS — I10 Essential (primary) hypertension: Secondary | ICD-10-CM

## 2022-11-25 DIAGNOSIS — M545 Low back pain, unspecified: Secondary | ICD-10-CM | POA: Diagnosis not present

## 2022-11-25 DIAGNOSIS — R739 Hyperglycemia, unspecified: Secondary | ICD-10-CM

## 2022-11-25 DIAGNOSIS — E119 Type 2 diabetes mellitus without complications: Secondary | ICD-10-CM

## 2022-11-25 DIAGNOSIS — N189 Chronic kidney disease, unspecified: Secondary | ICD-10-CM | POA: Diagnosis not present

## 2022-11-25 DIAGNOSIS — M25512 Pain in left shoulder: Secondary | ICD-10-CM | POA: Diagnosis not present

## 2022-11-25 DIAGNOSIS — M25562 Pain in left knee: Secondary | ICD-10-CM | POA: Diagnosis not present

## 2022-11-25 DIAGNOSIS — R519 Headache, unspecified: Secondary | ICD-10-CM | POA: Diagnosis not present

## 2022-11-25 DIAGNOSIS — T07XXXA Unspecified multiple injuries, initial encounter: Secondary | ICD-10-CM

## 2022-11-25 DIAGNOSIS — M25571 Pain in right ankle and joints of right foot: Secondary | ICD-10-CM | POA: Diagnosis not present

## 2022-11-25 LAB — URINALYSIS, ROUTINE W REFLEX MICROSCOPIC
Bacteria, UA: NONE SEEN
Bilirubin Urine: NEGATIVE
Glucose, UA: >=500 mg/dL — AB
Ketones, ur: NEGATIVE mg/dL
Leukocytes,Ua: NEGATIVE
Nitrite: NEGATIVE
Protein, ur: 100 mg/dL — AB
Specific Gravity, Urine: 1.018 (ref 1.005–1.030)
pH: 7 (ref 5.0–8.0)

## 2022-11-25 LAB — COMPREHENSIVE METABOLIC PANEL
ALT: 14 U/L (ref 0–44)
AST: 20 U/L (ref 15–41)
Albumin: 3.1 g/dL — ABNORMAL LOW (ref 3.5–5.0)
Alkaline Phosphatase: 145 U/L — ABNORMAL HIGH (ref 38–126)
Anion gap: 8 (ref 5–15)
BUN: 20 mg/dL (ref 6–20)
CO2: 22 mmol/L (ref 22–32)
Calcium: 8.7 mg/dL — ABNORMAL LOW (ref 8.9–10.3)
Chloride: 100 mmol/L (ref 98–111)
Creatinine, Ser: 1.82 mg/dL — ABNORMAL HIGH (ref 0.61–1.24)
GFR, Estimated: 46 mL/min — ABNORMAL LOW (ref >60)
Glucose, Bld: 514 mg/dL (ref 70–99)
Potassium: 3.6 mmol/L (ref 3.5–5.1)
Sodium: 130 mmol/L — ABNORMAL LOW (ref 135–145)
Total Bilirubin: 0.6 mg/dL (ref 0.3–1.2)
Total Protein: 7.8 g/dL (ref 6.5–8.1)

## 2022-11-25 LAB — CBC
HCT: 40 % (ref 39.0–52.0)
Hemoglobin: 13.4 g/dL (ref 13.0–17.0)
MCH: 28.3 pg (ref 26.0–34.0)
MCHC: 33.5 g/dL (ref 30.0–36.0)
MCV: 84.4 fL (ref 80.0–100.0)
Platelets: 271 10*3/uL (ref 150–400)
RBC: 4.74 MIL/uL (ref 4.22–5.81)
RDW: 14 % (ref 11.5–15.5)
WBC: 12.2 10*3/uL — ABNORMAL HIGH (ref 4.0–10.5)
nRBC: 0 % (ref 0.0–0.2)

## 2022-11-25 LAB — ETHANOL: Alcohol, Ethyl (B): <10 mg/dL (ref <10)

## 2022-11-25 MED ORDER — TRAMADOL HCL 50 MG PO TABS: 50.0000 mg | ORAL_TABLET | Freq: Four times a day (QID) | ORAL | 0 refills | Status: DC | PRN

## 2022-11-25 MED ORDER — HYDROMORPHONE HCL 1 MG/ML IJ SOLN
0.5000 mg | Freq: Once | INTRAMUSCULAR | Status: AC
  Administered 2022-11-25: 0.5 mg via INTRAVENOUS
  Filled 2022-11-25: qty 1

## 2022-11-25 MED ORDER — HYDRALAZINE HCL 20 MG/ML IJ SOLN
10.0000 mg | Freq: Once | INTRAMUSCULAR | Status: AC
  Administered 2022-11-25: 10 mg via INTRAVENOUS
  Filled 2022-11-25: qty 1

## 2022-11-25 MED ORDER — OXYCODONE-ACETAMINOPHEN 5-325 MG PO TABS
2.0000 | ORAL_TABLET | Freq: Once | ORAL | Status: AC
  Administered 2022-11-25: 2 via ORAL
  Filled 2022-11-25: qty 2

## 2022-11-25 MED ORDER — INSULIN ASPART 100 UNIT/ML IJ SOLN
10.0000 [IU] | Freq: Once | INTRAMUSCULAR | Status: AC
  Administered 2022-11-25: 10 [IU] via SUBCUTANEOUS
  Filled 2022-11-25: qty 0.1

## 2022-11-25 MED ORDER — SODIUM CHLORIDE 0.9 % IV BOLUS
1000.0000 mL | Freq: Once | INTRAVENOUS | Status: AC
  Administered 2022-11-25: 1000 mL via INTRAVENOUS
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
## 2022-11-25 NOTE — Discharge Instructions (Addendum)
It was our pleasure to provide your ER care today - we hope that you feel better.  Take acetaminophen as need for pain. You may also take ultram as need for pain - no driving for the next 6 hours or when taking ultram.   Your blood sugar is high - continue your meds, drink plenty of fluids/stay well hydrated, follow diabetes eating plan, monitor sugars and record values, and follow up closely with primary care doctor this week.   Your blood pressure is also high and kidney function mildly high - see attached info about managing hypertension and kidney disease - make sure to take your meds as prescribed, limit salt intake, and follow heart healthy eating plan - follow up with your doctor this coming week for recheck.   Return to ER if worse, new symptoms, new/severe pain, chest pain, trouble breathing, abdominal pain, or other emergency concern.
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
## 2022-11-30 ENCOUNTER — Telehealth (INDEPENDENT_AMBULATORY_CARE_PROVIDER_SITE_OTHER): Payer: Self-pay | Admitting: Nurse Practitioner
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
## 2022-12-03 ENCOUNTER — Encounter (INDEPENDENT_AMBULATORY_CARE_PROVIDER_SITE_OTHER): Payer: Self-pay | Admitting: Nurse Practitioner

## 2022-12-03 ENCOUNTER — Ambulatory Visit (INDEPENDENT_AMBULATORY_CARE_PROVIDER_SITE_OTHER): Payer: Medicaid Other | Admitting: Nurse Practitioner

## 2022-12-03 VITALS — BP 206/111 | HR 94 | Resp 16

## 2022-12-03 DIAGNOSIS — E1142 Type 2 diabetes mellitus with diabetic polyneuropathy: Secondary | ICD-10-CM

## 2022-12-03 DIAGNOSIS — Z89512 Acquired absence of left leg below knee: Secondary | ICD-10-CM | POA: Diagnosis not present

## 2022-12-03 MED ORDER — TRAMADOL HCL 50 MG PO TABS: 50.0000 mg | ORAL_TABLET | Freq: Four times a day (QID) | ORAL | 0 refills | Status: DC | PRN

## 2022-12-03 MED ORDER — PREGABALIN 75 MG PO CAPS: 75.0000 mg | ORAL_CAPSULE | Freq: Two times a day (BID) | ORAL | 3 refills | Status: DC

## 2022-12-06 DIAGNOSIS — Z89512 Acquired absence of left leg below knee: Secondary | ICD-10-CM | POA: Diagnosis not present

## 2022-12-18 ENCOUNTER — Encounter (INDEPENDENT_AMBULATORY_CARE_PROVIDER_SITE_OTHER): Payer: Self-pay | Admitting: Nurse Practitioner
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
## 2022-12-24 DIAGNOSIS — D631 Anemia in chronic kidney disease: Secondary | ICD-10-CM | POA: Diagnosis not present

## 2022-12-24 DIAGNOSIS — N2581 Secondary hyperparathyroidism of renal origin: Secondary | ICD-10-CM | POA: Diagnosis not present

## 2022-12-24 DIAGNOSIS — I129 Hypertensive chronic kidney disease with stage 1 through stage 4 chronic kidney disease, or unspecified chronic kidney disease: Secondary | ICD-10-CM | POA: Diagnosis not present

## 2022-12-24 DIAGNOSIS — E1122 Type 2 diabetes mellitus with diabetic chronic kidney disease: Secondary | ICD-10-CM | POA: Diagnosis not present

## 2022-12-24 DIAGNOSIS — R809 Proteinuria, unspecified: Secondary | ICD-10-CM | POA: Diagnosis not present

## 2022-12-24 DIAGNOSIS — N1831 Chronic kidney disease, stage 3a: Secondary | ICD-10-CM | POA: Diagnosis not present

## 2022-12-26 DIAGNOSIS — Z794 Long term (current) use of insulin: Secondary | ICD-10-CM | POA: Diagnosis not present

## 2022-12-26 DIAGNOSIS — Z9641 Presence of insulin pump (external) (internal): Secondary | ICD-10-CM | POA: Diagnosis not present

## 2022-12-26 DIAGNOSIS — E1165 Type 2 diabetes mellitus with hyperglycemia: Secondary | ICD-10-CM | POA: Diagnosis not present

## 2023-01-12 ENCOUNTER — Other Ambulatory Visit: Payer: Self-pay

## 2023-01-12 ENCOUNTER — Emergency Department
Admission: EM | Admit: 2023-01-12 | Discharge: 2023-01-12 | Disposition: A | Discharge status: Home / Self Care | Payer: Medicaid Other | Attending: Emergency Medicine | Admitting: Emergency Medicine

## 2023-01-12 DIAGNOSIS — R739 Hyperglycemia, unspecified: Secondary | ICD-10-CM

## 2023-01-12 DIAGNOSIS — E1165 Type 2 diabetes mellitus with hyperglycemia: Secondary | ICD-10-CM | POA: Insufficient documentation

## 2023-01-12 LAB — CBG MONITORING, ED: Glucose-Capillary: 395 mg/dL — ABNORMAL HIGH (ref 70–99)

## 2023-01-12 NOTE — ED Provider Notes (Signed)
[facility] Provider Note    Event Date/Time   First MD Initiated Contact with Patient 01/12/23 1224     (approximate)   History   Blood Sugar Problem   HPI  Richard Mendoza is a 47 y.o. male past medical history significant for diabetes who presents to the emergency department for medical clearance in order to go to jail.  Patient was being checked into jail and refused to remove his insulin pump.  The jail stated that he was unable to go into jail with an insulin pump present.  Patient refusing to remove it and states that he does not want the medical liability of not being able to correct his sugars which have been an issue in the past while in jail.  Does not want to move his insulin pump.  Denies any nausea or vomiting.  Patient's glucose in the emergency department was in the 300s.  States that he would give himself a corrective dose from his insulin pump.  States that even if his glucose had improved to normal he still would not feel comfortable removing his insulin pump.  PD is present with the patient and calling the jail for more instructions.     Physical Exam   Triage Vital Signs: ED Triage Vitals  Enc Vitals Group     BP 01/12/23 1209 (!) 146/117     Pulse Rate 01/12/23 1209 (!) 118     Resp 01/12/23 1209 18     Temp 01/12/23 1209 98.3 F (36.8 C)     Temp Source 01/12/23 1209 Oral     SpO2 01/12/23 1209 99 %     Weight 01/12/23 1210 207 lb (93.9 kg)     Height --      Head Circumference --      Peak Flow --      Pain Score 01/12/23 1210 0     Pain Loc --      Pain Edu? --      Excl. in [place]? --     Most recent vital signs: Vitals:   01/12/23 1209  BP: (!) 146/117  Pulse: (!) 118  Resp: 18  Temp: 98.3 F (36.8 C)  SpO2: 99%    Physical Exam Constitutional:      Appearance: He is well-developed.     Comments: Sitting in a chair in the side of the room with handcuffs on bilateral hands in front of him  HENT:     Head:  Atraumatic.  Eyes:     Conjunctiva/sclera: Conjunctivae normal.  Cardiovascular:     Rate and Rhythm: Regular rhythm.  Pulmonary:     Effort: No respiratory distress.  Musculoskeletal:     Cervical back: Normal range of motion.  Skin:    General: Skin is dry.  Neurological:     Mental Status: He is alert. Mental status is at baseline.      IMPRESSION / MDM / ASSESSMENT AND PLAN / ED COURSE  I reviewed the triage vital signs and the nursing notes.  Differential diagnosis including hyperglycemia.  Low suspicion for DKA or HHS, no altered mental status.  Patient's glucose in the emergency department is 300s.  No vomiting and not ill-appearing have a low suspicion for DKA.  Patient states that this would be normal for him and that he would give himself a corrected dose from his insulin pump.  Discussed with the patient that I would be happy to order him a dose of insulin  subcu as a corrective dose and recheck his glucose.  States he states that he would not want this and that he just wants to use his insulin pump.  States that he has no other complaints that he would want to be evaluated for in the emergency department.  Discussed with PD that I would not be removing an insulin pump against patient's will as that is not a medical issue.  Patient has no other complaints that he wants to be evaluated for in the emergency department.  Discussed with the patient that he could use insulin sliding scale in jail.    Labs (all labs ordered are listed, but only abnormal results are displayed) Labs interpreted as -    Labs Reviewed  CBG MONITORING, ED - Abnormal; Notable for the following components:      Result Value   Glucose-Capillary 395 (*)    All other components within normal limits      Patient was discharged to return back to jail.  Given return precautions for any concern for hyperglycemia or DKA or need for reevaluation.  Discharged with PD   PROCEDURES:  Critical Care  performed: No  Procedures  Patient's presentation is most consistent with acute presentation with potential threat to life or bodily function.   MEDICATIONS ORDERED IN ED: Medications - No data to display  FINAL CLINICAL IMPRESSION(S) / ED DIAGNOSES   Final diagnoses:  Hyperglycemia     Rx / DC Orders   ED Discharge Orders     None        Note:  This document was prepared using Dragon voice recognition software and may include unintentional dictation errors.   Nathaniel Man, MD 01/12/23 1236

## 2023-01-12 NOTE — Discharge Instructions (Addendum)
You were seen in the emergency department for hyperglycemia.  Correct your hyperglycemia based on your corrective scale from your insulin pump as needed.  You can also use insulin sliding scale if pump is removed.

## 2023-01-12 NOTE — ED Triage Notes (Signed)
Pt is at the jail and refusing to take out his insulin pump or let staff there touch him or check his blood sugar. Deputy brought pt here for removal and blood sugar check. Pt states it was running high this morning.

## 2023-02-01 ENCOUNTER — Ambulatory Visit (INDEPENDENT_AMBULATORY_CARE_PROVIDER_SITE_OTHER): Payer: Medicaid Other | Admitting: Nurse Practitioner

## 2023-04-29 ENCOUNTER — Emergency Department: Payer: 59

## 2023-04-29 ENCOUNTER — Telehealth (INDEPENDENT_AMBULATORY_CARE_PROVIDER_SITE_OTHER): Payer: Self-pay

## 2023-04-29 ENCOUNTER — Other Ambulatory Visit: Payer: Self-pay

## 2023-04-29 ENCOUNTER — Inpatient Hospital Stay
Admission: EM | Admit: 2023-04-29 | Discharge: 2023-05-03 | DRG: 603 | Disposition: A | Discharge status: Home / Self Care | Payer: 59 | Attending: Osteopathic Medicine | Admitting: Osteopathic Medicine

## 2023-04-29 DIAGNOSIS — L97929 Non-pressure chronic ulcer of unspecified part of left lower leg with unspecified severity: Secondary | ICD-10-CM | POA: Diagnosis present

## 2023-04-29 DIAGNOSIS — Z89511 Acquired absence of right leg below knee: Secondary | ICD-10-CM

## 2023-04-29 DIAGNOSIS — G894 Chronic pain syndrome: Secondary | ICD-10-CM | POA: Diagnosis present

## 2023-04-29 DIAGNOSIS — I10 Essential (primary) hypertension: Secondary | ICD-10-CM | POA: Diagnosis present

## 2023-04-29 DIAGNOSIS — K5900 Constipation, unspecified: Secondary | ICD-10-CM | POA: Diagnosis present

## 2023-04-29 DIAGNOSIS — L02416 Cutaneous abscess of left lower limb: Secondary | ICD-10-CM | POA: Diagnosis not present

## 2023-04-29 DIAGNOSIS — E10622 Type 1 diabetes mellitus with other skin ulcer: Secondary | ICD-10-CM | POA: Diagnosis present

## 2023-04-29 DIAGNOSIS — M726 Necrotizing fasciitis: Secondary | ICD-10-CM | POA: Diagnosis present

## 2023-04-29 DIAGNOSIS — E1022 Type 1 diabetes mellitus with diabetic chronic kidney disease: Secondary | ICD-10-CM | POA: Diagnosis present

## 2023-04-29 DIAGNOSIS — E1042 Type 1 diabetes mellitus with diabetic polyneuropathy: Secondary | ICD-10-CM | POA: Diagnosis present

## 2023-04-29 DIAGNOSIS — Z8249 Family history of ischemic heart disease and other diseases of the circulatory system: Secondary | ICD-10-CM

## 2023-04-29 DIAGNOSIS — Z833 Family history of diabetes mellitus: Secondary | ICD-10-CM

## 2023-04-29 DIAGNOSIS — E1142 Type 2 diabetes mellitus with diabetic polyneuropathy: Secondary | ICD-10-CM | POA: Diagnosis present

## 2023-04-29 DIAGNOSIS — L03116 Cellulitis of left lower limb: Principal | ICD-10-CM | POA: Diagnosis present

## 2023-04-29 DIAGNOSIS — N1831 Chronic kidney disease, stage 3a: Secondary | ICD-10-CM | POA: Diagnosis present

## 2023-04-29 DIAGNOSIS — E1169 Type 2 diabetes mellitus with other specified complication: Secondary | ICD-10-CM | POA: Diagnosis present

## 2023-04-29 DIAGNOSIS — E1051 Type 1 diabetes mellitus with diabetic peripheral angiopathy without gangrene: Secondary | ICD-10-CM | POA: Diagnosis present

## 2023-04-29 DIAGNOSIS — Z8739 Personal history of other diseases of the musculoskeletal system and connective tissue: Secondary | ICD-10-CM

## 2023-04-29 DIAGNOSIS — Z79899 Other long term (current) drug therapy: Secondary | ICD-10-CM | POA: Diagnosis not present

## 2023-04-29 DIAGNOSIS — F17210 Nicotine dependence, cigarettes, uncomplicated: Secondary | ICD-10-CM | POA: Diagnosis present

## 2023-04-29 DIAGNOSIS — Z8659 Personal history of other mental and behavioral disorders: Secondary | ICD-10-CM

## 2023-04-29 DIAGNOSIS — E876 Hypokalemia: Secondary | ICD-10-CM | POA: Diagnosis present

## 2023-04-29 DIAGNOSIS — I129 Hypertensive chronic kidney disease with stage 1 through stage 4 chronic kidney disease, or unspecified chronic kidney disease: Secondary | ICD-10-CM | POA: Diagnosis present

## 2023-04-29 DIAGNOSIS — Z720 Tobacco use: Secondary | ICD-10-CM | POA: Diagnosis present

## 2023-04-29 DIAGNOSIS — E1040 Type 1 diabetes mellitus with diabetic neuropathy, unspecified: Secondary | ICD-10-CM | POA: Diagnosis present

## 2023-04-29 DIAGNOSIS — E785 Hyperlipidemia, unspecified: Secondary | ICD-10-CM | POA: Diagnosis present

## 2023-04-29 DIAGNOSIS — E1069 Type 1 diabetes mellitus with other specified complication: Secondary | ICD-10-CM | POA: Diagnosis present

## 2023-04-29 DIAGNOSIS — F3340 Major depressive disorder, recurrent, in remission, unspecified: Secondary | ICD-10-CM | POA: Diagnosis present

## 2023-04-29 DIAGNOSIS — I739 Peripheral vascular disease, unspecified: Secondary | ICD-10-CM | POA: Diagnosis present

## 2023-04-29 DIAGNOSIS — Z794 Long term (current) use of insulin: Secondary | ICD-10-CM

## 2023-04-29 DIAGNOSIS — L89891 Pressure ulcer of other site, stage 1: Secondary | ICD-10-CM | POA: Diagnosis not present

## 2023-04-29 DIAGNOSIS — Z87891 Personal history of nicotine dependence: Secondary | ICD-10-CM | POA: Diagnosis not present

## 2023-04-29 DIAGNOSIS — Z89512 Acquired absence of left leg below knee: Secondary | ICD-10-CM

## 2023-04-29 DIAGNOSIS — N179 Acute kidney failure, unspecified: Secondary | ICD-10-CM | POA: Diagnosis present

## 2023-04-29 LAB — CBC WITH DIFFERENTIAL/PLATELET
Abs Immature Granulocytes: 0.06 10*3/uL (ref 0.00–0.07)
Basophils Absolute: 0.1 10*3/uL (ref 0.0–0.1)
Basophils Relative: 0 %
Eosinophils Absolute: 0.1 10*3/uL (ref 0.0–0.5)
Eosinophils Relative: 0 %
HCT: 36.8 % — ABNORMAL LOW (ref 39.0–52.0)
Hemoglobin: 12.3 g/dL — ABNORMAL LOW (ref 13.0–17.0)
Immature Granulocytes: 0 %
Lymphocytes Relative: 12 %
Lymphs Abs: 1.9 10*3/uL (ref 0.7–4.0)
MCH: 28 pg (ref 26.0–34.0)
MCHC: 33.4 g/dL (ref 30.0–36.0)
MCV: 83.6 fL (ref 80.0–100.0)
Monocytes Absolute: 0.9 10*3/uL (ref 0.1–1.0)
Monocytes Relative: 5 %
Neutro Abs: 13.6 10*3/uL — ABNORMAL HIGH (ref 1.7–7.7)
Neutrophils Relative %: 83 %
Platelets: 224 10*3/uL (ref 150–400)
RBC: 4.4 MIL/uL (ref 4.22–5.81)
RDW: 14.6 % (ref 11.5–15.5)
WBC: 16.6 10*3/uL — ABNORMAL HIGH (ref 4.0–10.5)
nRBC: 0 % (ref 0.0–0.2)

## 2023-04-29 LAB — COMPREHENSIVE METABOLIC PANEL
ALT: 12 U/L (ref 0–44)
AST: 19 U/L (ref 15–41)
Albumin: 3.3 g/dL — ABNORMAL LOW (ref 3.5–5.0)
Alkaline Phosphatase: 109 U/L (ref 38–126)
Anion gap: 10 (ref 5–15)
BUN: 18 mg/dL (ref 6–20)
CO2: 21 mmol/L — ABNORMAL LOW (ref 22–32)
Calcium: 8.5 mg/dL — ABNORMAL LOW (ref 8.9–10.3)
Chloride: 102 mmol/L (ref 98–111)
Creatinine, Ser: 1.83 mg/dL — ABNORMAL HIGH (ref 0.61–1.24)
GFR, Estimated: 45 mL/min — ABNORMAL LOW (ref >60)
Glucose, Bld: 124 mg/dL — ABNORMAL HIGH (ref 70–99)
Potassium: 2.7 mmol/L — CL (ref 3.5–5.1)
Sodium: 133 mmol/L — ABNORMAL LOW (ref 135–145)
Total Bilirubin: 1.4 mg/dL — ABNORMAL HIGH (ref 0.3–1.2)
Total Protein: 7.3 g/dL (ref 6.5–8.1)

## 2023-04-29 LAB — LACTIC ACID, PLASMA: Lactic Acid, Venous: 1.3 mmol/L (ref 0.5–1.9)

## 2023-04-29 MED ORDER — LORAZEPAM 2 MG/ML IJ SOLN: 0.5000 mg | Freq: Four times a day (QID) | INTRAMUSCULAR | Status: AC | PRN

## 2023-04-29 MED ORDER — FENTANYL CITRATE PF 50 MCG/ML IJ SOSY
100.0000 ug | PREFILLED_SYRINGE | Freq: Once | INTRAMUSCULAR | Status: DC
  Filled 2023-04-29: qty 2

## 2023-04-29 MED ORDER — VANCOMYCIN HCL 2000 MG/400ML IV SOLN
2000.0000 mg | Freq: Once | INTRAVENOUS | Status: AC
  Administered 2023-04-29: 2000 mg via INTRAVENOUS
  Filled 2023-04-29: qty 400

## 2023-04-29 MED ORDER — ATORVASTATIN CALCIUM 20 MG PO TABS
40.0000 mg | ORAL_TABLET | Freq: Every day | ORAL | Status: DC
  Administered 2023-04-30 – 2023-05-03 (×4): 40 mg via ORAL
  Filled 2023-04-29 (×4): qty 2

## 2023-04-29 MED ORDER — POTASSIUM CHLORIDE CRYS ER 20 MEQ PO TBCR
40.0000 meq | EXTENDED_RELEASE_TABLET | Freq: Once | ORAL | Status: AC
  Administered 2023-04-29: 40 meq via ORAL
  Filled 2023-04-29: qty 2

## 2023-04-29 MED ORDER — NICOTINE 21 MG/24HR TD PT24
21.0000 mg | MEDICATED_PATCH | Freq: Every day | TRANSDERMAL | Status: DC | PRN
  Administered 2023-04-30: 21 mg via TRANSDERMAL
  Filled 2023-04-29 (×2): qty 1

## 2023-04-29 MED ORDER — MORPHINE SULFATE (PF) 4 MG/ML IV SOLN
4.0000 mg | INTRAVENOUS | Status: AC | PRN
  Administered 2023-04-30 (×2): 4 mg via INTRAVENOUS
  Filled 2023-04-29 (×2): qty 1

## 2023-04-29 MED ORDER — ONDANSETRON HCL 4 MG/2ML IJ SOLN
4.0000 mg | Freq: Once | INTRAMUSCULAR | Status: AC
  Administered 2023-04-29: 4 mg via INTRAVENOUS
  Filled 2023-04-29: qty 2

## 2023-04-29 MED ORDER — ACETAMINOPHEN 650 MG RE SUPP: 650.0000 mg | Freq: Four times a day (QID) | RECTAL | Status: DC | PRN

## 2023-04-29 MED ORDER — ACETAMINOPHEN 325 MG PO TABS: 650.0000 mg | ORAL_TABLET | Freq: Four times a day (QID) | ORAL | Status: DC | PRN

## 2023-04-29 MED ORDER — POTASSIUM CHLORIDE 10 MEQ/100ML IV SOLN
10.0000 meq | INTRAVENOUS | Status: AC
  Administered 2023-04-30 (×4): 10 meq via INTRAVENOUS
  Filled 2023-04-29: qty 100

## 2023-04-29 MED ORDER — MELATONIN 5 MG PO TABS: 5.0000 mg | ORAL_TABLET | Freq: Every evening | ORAL | Status: DC | PRN

## 2023-04-29 MED ORDER — INSULIN ASPART 100 UNIT/ML IJ SOLN
0.0000 [IU] | Freq: Every day | INTRAMUSCULAR | Status: DC
  Administered 2023-04-30: 0 [IU] via SUBCUTANEOUS

## 2023-04-29 MED ORDER — ONDANSETRON HCL 4 MG/2ML IJ SOLN: 4.0000 mg | Freq: Four times a day (QID) | INTRAMUSCULAR | Status: DC | PRN

## 2023-04-29 MED ORDER — HEPARIN SODIUM (PORCINE) 5000 UNIT/ML IJ SOLN
5000.0000 [IU] | Freq: Three times a day (TID) | INTRAMUSCULAR | Status: DC
  Administered 2023-04-29 – 2023-05-03 (×11): 5000 [IU] via SUBCUTANEOUS
  Filled 2023-04-29 (×12): qty 1

## 2023-04-29 MED ORDER — FENTANYL CITRATE PF 50 MCG/ML IJ SOSY: 25.0000 ug | PREFILLED_SYRINGE | INTRAMUSCULAR | Status: AC | PRN

## 2023-04-29 MED ORDER — BENAZEPRIL HCL 20 MG PO TABS
20.0000 mg | ORAL_TABLET | Freq: Every day | ORAL | Status: DC
  Administered 2023-04-30 – 2023-05-03 (×4): 20 mg via ORAL
  Filled 2023-04-29 (×4): qty 1

## 2023-04-29 MED ORDER — HYDROMORPHONE HCL 1 MG/ML IJ SOLN
1.0000 mg | Freq: Once | INTRAMUSCULAR | Status: AC
  Administered 2023-04-29: 1 mg via INTRAVENOUS
  Filled 2023-04-29: qty 1

## 2023-04-29 MED ORDER — SENNOSIDES-DOCUSATE SODIUM 8.6-50 MG PO TABS: 1.0000 | ORAL_TABLET | Freq: Every evening | ORAL | Status: DC | PRN

## 2023-04-29 MED ORDER — LACTATED RINGERS IV BOLUS
1000.0000 mL | Freq: Once | INTRAVENOUS | Status: AC
  Administered 2023-04-29: 1000 mL via INTRAVENOUS

## 2023-04-29 MED ORDER — HYDRALAZINE HCL 20 MG/ML IJ SOLN
5.0000 mg | Freq: Four times a day (QID) | INTRAMUSCULAR | Status: DC | PRN
  Administered 2023-05-03 (×2): 5 mg via INTRAVENOUS
  Filled 2023-04-29 (×2): qty 1

## 2023-04-29 MED ORDER — ONDANSETRON HCL 4 MG PO TABS: 4.0000 mg | ORAL_TABLET | Freq: Four times a day (QID) | ORAL | Status: DC | PRN

## 2023-04-29 MED ORDER — PREGABALIN 75 MG PO CAPS
75.0000 mg | ORAL_CAPSULE | Freq: Two times a day (BID) | ORAL | Status: DC
  Administered 2023-04-29 – 2023-05-03 (×8): 75 mg via ORAL
  Filled 2023-04-29 (×8): qty 1

## 2023-04-29 MED ORDER — PIPERACILLIN-TAZOBACTAM 3.375 G IVPB 30 MIN
3.3750 g | Freq: Once | INTRAVENOUS | Status: AC
  Administered 2023-04-29: 3.375 g via INTRAVENOUS
  Filled 2023-04-29: qty 50

## 2023-04-29 MED ORDER — PIPERACILLIN-TAZOBACTAM 3.375 G IVPB
3.3750 g | Freq: Three times a day (TID) | INTRAVENOUS | Status: DC
  Administered 2023-04-30 – 2023-05-03 (×10): 3.375 g via INTRAVENOUS
  Filled 2023-04-29 (×11): qty 50

## 2023-04-29 MED ORDER — VANCOMYCIN HCL 1500 MG/300ML IV SOLN: 1500.0000 mg | INTRAVENOUS | Status: DC

## 2023-04-29 MED ORDER — INSULIN ASPART 100 UNIT/ML IJ SOLN
0.0000 [IU] | Freq: Three times a day (TID) | INTRAMUSCULAR | Status: DC
  Filled 2023-04-29: qty 1

## 2023-04-29 NOTE — Assessment & Plan Note (Signed)
PDMP reviewed.

## 2023-04-29 NOTE — Assessment & Plan Note (Signed)
As needed nicotine patch ordered Counseling given, patient does not appear to be ready to quit

## 2023-04-29 NOTE — Telephone Encounter (Signed)
I'm not sending in ABX for a couple of reasons.  If he has a fever he could be turning septic and if his sugars are up, he could be having a reaction because his sugars are high.  I'd recommend an urgent care or possibly the ED.  Also, he has a tendency to no show his appointments so I'm not comfortable giving abx to someone that may not follow up.  We can try to get him in sooner, tomorrow possibly to evaluate however

## 2023-04-29 NOTE — H&P (Addendum)
History and Physical   Emeal Mailhot HYQ:[id number] DOB: 1976/01/30 DOA: 04/29/2023  PCP: Barbette Reichmann, MD  Outpatient Specialists: Dr. Wyn Quaker, vascular surgeon Patient coming from: Home via POV  I have personally briefly reviewed patient's old medical records in [facility] EMR.  Chief Concern: Left leg infection, fever at home  HPI: [name] is a 47 year old male with history of CKD 3A, history of necrotizing fasciitis status post BKA, insulin-dependent diabetes mellitus type 1, hypertension, depression, anxiety, neuropathy, chronic pain syndrome, who presents to the emergency department for chief concerns of infection of the left lower extremity.  Vitals in the ED showed temperature of 98.4, respiration of 16, heart rate 95, blood pressure 163/93, SpO2 of 99 percent on room air.  Serum sodium is 133, potassium 2.7, chloride 102, bicarb 21, BUN of 18, serum creatinine 1.83, eGFR 45, nonfasting blood glucose 124, WBC 16.6, hemoglobin 12.3, platelets of 224.  Lactic acid is 1.3. Blood cultures x 2 have been collected and are in process.  ED treatment: Dilaudid 1 mg IV one-time dose, ondansetron 4 mg IV one-time dose, potassium chloride 40 mill equivalent p.o. one-time dose, ondansetron 1 ----------------------------- At bedside, patient was able to tell me his name, age, location, current calendar year.  He reports he developed a wound in his medial left leg about 2 weeks ago however over the last 2 days it started hurting more.  He endorses subjective fever and chills last evening.  His spouse asked him to come to the emergency department and he declined.  They did not have a thermometer to check patient's temperature.  He denies nausea, vomiting, chest pain, shortness of breath, dysuria, hematuria, diarrhea, syncope, loss of consciousness.  Social history: He lives at home with his wife.  He is a current tobacco user, smoking 1 to 1.5 packs/day.  He is  disabled.  ROS: Constitutional: no weight change, + fever ENT/Mouth: no sore throat, no rhinorrhea Eyes: no eye pain, no vision changes Cardiovascular: no chest pain, no dyspnea,  no edema, no palpitations Respiratory: no cough, no sputum, no wheezing Gastrointestinal: no nausea, no vomiting, no diarrhea, no constipation Genitourinary: no urinary incontinence, no dysuria, no hematuria Musculoskeletal: no arthralgias, no myalgias Skin: no skin lesions, no pruritus, + left leg wound Neuro: no weakness, no loss of consciousness, no syncope Psych: no anxiety, no depression, no decrease appetite Heme/Lymph: no bruising, no bleeding  ED Course: With emergency medicine provider, patient requiring hospitalization for chief concerns of left leg infection.  Assessment/Plan  Principal Problem:   Left leg cellulitis Active Problems:   Essential hypertension   Diabetic neuropathy, type I diabetes mellitus (HCC)   Chronic pain syndrome   Diabetic peripheral neuropathy associated with type 2 diabetes mellitus (HCC)   Hyperlipidemia associated with type 2 diabetes mellitus (HCC)   Tobacco abuse disorder   History of depression   Necrotizing fasciitis of ankle and foot (HCC)   S/P BKA (below knee amputation), left (HCC)   Constipation   Major depressive disorder, recurrent, in remission (HCC)   PAD (peripheral artery disease) (HCC)   Hypokalemia   Tobacco use   Assessment and Plan:  * Left leg cellulitis      Blood cultures x 2 are in process Vancomycin and cefepime ordered Symptomatic support: Morphine 4 mg IV every 4 hours.  For moderate pain, 12 hours of coverage ordered; fentanyl 25 mcg IV every 4 hours as needed for severe pain, 12 hours of coverage ordered LR 1 L bolus ordered  MRI of the left femur and left knee with contrast (with notes to take imaging down stump) ordered Wound care consulted AM team to consult orthopedic for osteomyelitis versus general surgery for possible  debridement requirement as appropriate pending MRI Admit to telemetry cardiac, inpatient  Tobacco use As needed nicotine patch ordered Counseling given, patient does not appear to be ready to quit  Hypokalemia Check serum magnesium level on admission Status post potassium chloride 40 mEq PO one-time dose per EDP IV potassium chloride 10 mill equivalent, 4 doses ordered on admission EKG ordered on admission, pending completion at the time of this dictation Repeat BMP in a.m.  Chronic pain syndrome PDMP reviewed  Diabetic neuropathy, type I diabetes mellitus (HCC) Insulin SSI with agents coverage ordered  Essential hypertension Hydralazine 5 mg IV every 6 hours as needed for SBP greater than 175, 4 days ordered  Chart reviewed.   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   Past Surgical History:  Procedure Laterality Date   AMPUTATION Left 11/07/2020   Procedure: 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;  Surgeon: Linus Galas, DPM;  Location: [facility];  Service: Podiatry;  Laterality: Left;   AMPUTATION Left 11/16/2020   Procedure: AMPUTATION BELOW KNEE;  Surgeon: Annice Needy, MD;  Location: [facility];  Service: Vascular;  Laterality: Left;   ANTERIOR CERVICAL DECOMP/DISCECTOMY FUSION N/A 09/09/2017   Procedure: ANTERIOR CERVICAL DECOMPRESSION/DISCECTOMY FUSION CERVICAL 6- CERVICAL 7;  Surgeon: Coletta Memos, MD;  Location: [facility];  Service: Neurosurgery;  Laterality: N/A;  ANTERIOR CERVICAL  DECOMPRESSION/DISCECTOMY FUSION CERVICAL 6- CERVICAL 7   APPENDECTOMY     I & D EXTREMITY Right 10/03/2017   Procedure: IRRIGATION AND DEBRIDEMENT RIGHT WRIST;  Surgeon: Betha Loa, MD;  Location: [facility] OR;  Service: Orthopedics;  Laterality: Right;   I & D EXTREMITY Right 11/26/2018   Procedure: IRRIGATION AND DEBRIDEMENT FASCIA ON RIGHT FOOT;  Surgeon: Gwyneth Revels, DPM;  Location: [facility];  Service: Podiatry;  Laterality: Right;   INCISION AND DRAINAGE Right 03/06/2019   Procedure: INCISION AND DRAINAGE RIGHT FOOT, WITH 4th RAY AMPUTATION;  Surgeon: Gwyneth Revels, DPM;  Location: [facility];  Service: Podiatry;  Laterality: Right;   INCISION AND DRAINAGE Left 11/07/2020   Procedure: INCISION AND DRAINAGE;  Surgeon: Linus Galas, DPM;  Location: [facility];  Service: Podiatry;  Laterality: Left;   IRRIGATION AND DEBRIDEMENT FOOT Left 11/11/2020   Procedure: IRRIGATION AND DEBRIDEMENT FOOT;  Surgeon: Linus Galas, DPM;  Location: [facility];  Service: Podiatry;  Laterality: Left;   METATARSAL HEAD EXCISION Right 05/15/2019   Procedure: OSTECTOMY;MET HEAD 5;  Surgeon: Gwyneth Revels, DPM;  Location: [facility];  Service: Podiatry;  Laterality: Right;   osteomylitis     ROTATOR CUFF REPAIR Left    Social History:  reports that he quit smoking about 4 years ago. His smoking use included cigarettes. He has a 17.00 pack-year smoking history. He has never used smokeless tobacco. He reports current alcohol use. He reports that he does not use drugs.  No Known Allergies Family History  Problem Relation Age of Onset  Diabetes Mother    Heart disease Father    Diabetes Father    Arthritis Other    Hyperlipidemia Other    Hypertension Other    Cancer Other        breast   Mental illness Neg Hx    Family history: Family history reviewed and not pertinent.  Prior to Admission medications   Medication Sig Start Date End Date Taking? Authorizing Provider  atorvastatin (LIPITOR) 40 MG tablet Take 40 mg by  mouth daily.  Patient not taking: Reported on 10/26/2022 08/23/17   [provider]  benazepril (LOTENSIN) 20 MG tablet Take 20 mg by mouth daily. Patient not taking: Reported on 10/26/2022 06/01/21   [provider]  busPIRone (BUSPAR) 15 MG tablet Take 15 mg by mouth 2 (two) times daily. Patient not taking: Reported on 10/26/2022    [provider]  collagenase (SANTYL) ointment Apply topically daily. Patient not taking: Reported on 10/26/2022 09/10/21   Arnetha Courser, MD  [facility] SENNA 8.6 MG tablet Take 1 tablet by mouth 2 (two) times daily as needed for constipation. Patient not taking: Reported on 10/26/2022 10/31/17   [provider]  dicyclomine (BENTYL) 10 MG capsule Take 10 mg by mouth in the morning and at bedtime. Patient not taking: Reported on 10/26/2022 04/27/20   [provider]  doxycycline (VIBRAMYCIN) 100 MG capsule Take 1 capsule (100 mg total) by mouth 2 (two) times daily. Patient not taking: Reported on 10/26/2022 07/12/22   Georgiana Spinner, NP  famotidine (PEPCID) 20 MG tablet Take 20 mg by mouth daily. Patient not taking: Reported on 10/26/2022    [provider]  FIASP 100 UNIT/ML SOLN Inject into the skin continuous. Via pump 10/09/20   [provider]  gabapentin (NEURONTIN) 300 MG capsule Take 1 capsule (300 mg total) by mouth 2 (two) times daily with breakfast and lunch AND 2 capsules (600 mg total) at bedtime. Patient not taking: Reported on 10/26/2022 01/20/21   Georgiana Spinner, NP  linaclotide [facility]) 72 MCG capsule Take 72 mcg by mouth daily as needed (constipation). Patient not taking: Reported on 10/26/2022    [provider]  methocarbamol (ROBAXIN) 500 MG tablet Take 2 tablets by mouth 2 (two) times daily as needed for muscle spasms. Patient not taking: Reported on 10/26/2022 03/17/21   [provider]  metoCLOPramide (REGLAN) 10 MG tablet Take 10 mg by mouth daily. Patient not taking: Reported on  10/26/2022 05/25/19   [provider]  ondansetron (ZOFRAN) 8 MG tablet Take 8 mg by mouth every 6 (six) hours as needed for nausea/vomiting. Patient not taking: Reported on 10/26/2022    [provider]  oxyCODONE (OXY IR/ROXICODONE) 5 MG immediate release tablet Take 1 tablet (5 mg total) by mouth every 4 (four) hours as needed for moderate pain or severe pain. Patient not taking: Reported on 10/26/2022 11/21/20   Stegmayer, Cala Bradford A, PA-C  pantoprazole (PROTONIX) 40 MG tablet Take 40 mg by mouth daily. Patient not taking: Reported on 10/26/2022 08/23/17   [provider]  pregabalin (LYRICA) 75 MG capsule Take 1 capsule (75 mg total) by mouth 2 (two) times daily. 12/03/22   Georgiana Spinner, NP  sertraline (ZOLOFT) 100 MG tablet Take 100 mg by mouth daily.  Patient not taking: Reported on 10/26/2022 10/31/17   [provider]  sildenafil (REVATIO) 20 MG tablet Take 100 mg by mouth daily as needed (erectile dysfunction). Patient not taking: Reported on 10/26/2022  [provider]  traMADol (ULTRAM) 50 MG tablet Take 1 tablet (50 mg total) by mouth every 6 (six) hours as needed. 12/03/22   Georgiana Spinner, NP  traZODone (DESYREL) 100 MG tablet Take 100 mg by mouth at bedtime. Patient not taking: Reported on 10/26/2022    [provider]  vitamin B-12 (CYANOCOBALAMIN) 1000 MCG tablet Take 1,000 mcg by mouth in the morning and at bedtime.  Patient not taking: Reported on 10/26/2022 01/27/19   [provider]   Physical Exam: Vitals:   04/29/23 1921 04/29/23 1923  BP:  (!) 163/93  Pulse: 95   Resp: 16   Temp: 98.4 F (36.9 C)   SpO2: 99%   Weight:  93 kg  Height:  5\' 10"  (1.778 m)   Constitutional: appears older than chronological age, NAD , calm Eyes: PERRL, lids and conjunctivae normal ENMT: Mucous membranes are moist. Posterior pharynx clear of any exudate or lesions. Age-appropriate dentition. Hearing appropriate Neck: normal,  supple, no masses, no thyromegaly Respiratory: clear to auscultation bilaterally, no wheezing, no crackles. Normal respiratory effort. No accessory muscle use.  Cardiovascular: Regular rate and rhythm, no murmurs / rubs / gallops. No extremity edema. 2+ pedal pulses. No carotid bruits.  Abdomen: no tenderness, no masses palpated, no hepatosplenomegaly. Bowel sounds positive.  Musculoskeletal: no clubbing / cyanosis. No joint deformity upper and lower extremities. Good ROM, no contractures, no atrophy. Normal muscle tone.  Skin: + Left medial leg wound infection Neurologic: Sensation intact. Strength 5/5 in all 4.  Psychiatric: Normal judgment and insight. Alert and oriented x 3. Normal mood.   EKG: Ordered  X-ray on Admission: I personally reviewed and I agree with radiologist reading as below.  DG Knee Complete 4 Views Left  Result Date: 04/29/2023 CLINICAL DATA:  Pressure wound, cellulitis. EXAM: LEFT KNEE - COMPLETE 4+ VIEW COMPARISON:  Knee radiographs 11/25/2022. FINDINGS: The patient is status post below-knee amputation. There is soft tissue irregularity overlying the distal aspect of the stump which may correspond to the reported wound, with surrounding swelling. There is no osseous erosion or destruction in the underlying bone. Knee alignment is maintained. The joint spaces are preserved. IMPRESSION: Status post below-knee amputation with soft tissue irregularity overlying the distal aspect of the stump which may correspond to the reported wound, with surrounding swelling. No erosion or destruction of the underlying bone. Electronically Signed   By: Lesia Hausen M.D.   On: 04/29/2023 21:37    Labs on Admission: I have personally reviewed following labs  CBC: Recent Labs  Lab 04/29/23 1923  WBC 16.6*  NEUTROABS 13.6*  HGB 12.3*  HCT 36.8*  MCV 83.6  PLT 224   Basic Metabolic Panel: Recent Labs  Lab 04/29/23 1923  NA 133*  K 2.7*  CL 102  CO2 21*  GLUCOSE 124*  BUN 18   CREATININE 1.83*  CALCIUM 8.5*   GFR: Estimated Creatinine Clearance: 57.2 mL/min (A) (by C-G formula based on SCr of 1.83 mg/dL (H)).  Liver Function Tests: Recent Labs  Lab 04/29/23 1923  AST 19  ALT 12  ALKPHOS 109  BILITOT 1.4*  PROT 7.3  ALBUMIN 3.3*   Urine analysis:    Component Value Date/Time   COLORURINE STRAW (A) 11/25/2022 1839   APPEARANCEUR CLEAR 11/25/2022 1839   LABSPEC 1.018 11/25/2022 1839   PHURINE 7.0 11/25/2022 1839   GLUCOSEU >=500 (A) 11/25/2022 1839   HGBUR MODERATE (A) 11/25/2022 1839   BILIRUBINUR NEGATIVE 11/25/2022 1839   KETONESUR  NEGATIVE 11/25/2022 1839   PROTEINUR 100 (A) 11/25/2022 1839   UROBILINOGEN 0.2 07/31/2011 2217   NITRITE NEGATIVE 11/25/2022 1839   LEUKOCYTESUR NEGATIVE 11/25/2022 1839   This document was prepared using Dragon Voice Recognition software and may include unintentional dictation errors.  Dr. Sedalia Muta [facility]  If 7PM-7AM, please contact overnight-coverage provider If 7AM-7PM, please contact day attending provider [email or web address]  04/29/2023, 11:20 PM

## 2023-04-29 NOTE — Telephone Encounter (Signed)
Spoke with pt and he states he will take the appt tomorrow at 1pm if something gets worse and he goes to the ED he will call to cancel this appt tomorrow.

## 2023-04-29 NOTE — Assessment & Plan Note (Signed)
-   Hydralazine 5 mg IV every 6 hours as needed for SBP greater than 175, 4 days ordered 

## 2023-04-29 NOTE — Assessment & Plan Note (Addendum)
      Blood cultures x 2 are in process Vancomycin and cefepime ordered Symptomatic support: Morphine 4 mg IV every 4 hours.  For moderate pain, 12 hours of coverage ordered; fentanyl 25 mcg IV every 4 hours as needed for severe pain, 12 hours of coverage ordered LR 1 L bolus ordered MRI of the left femur and left knee with contrast (with notes to take imaging down stump) ordered Wound care consulted AM team to consult orthopedic for osteomyelitis versus general surgery for possible debridement requirement as appropriate pending MRI Admit to telemetry cardiac, inpatient

## 2023-04-29 NOTE — Assessment & Plan Note (Addendum)
Check serum magnesium level on admission Status post potassium chloride 40 mEq PO one-time dose per EDP IV potassium chloride 10 mill equivalent, 4 doses ordered on admission EKG ordered on admission, pending completion at the time of this dictation Repeat BMP in a.m.

## 2023-04-29 NOTE — Hospital Course (Signed)
Richard Mendoza is a 47 year old male with history of CKD 3A, history of necrotizing fasciitis status post L BKA, insulin-dependent diabetes mellitus type 1, hypertension, depression, anxiety, neuropathy, chronic pain syndrome, who presents to the emergency department for chief concerns of infection of the left lower extremity. 06/10: VSS, hypokalemia, AKI, leukocytosis, Admitted for cellulitis LLE.  06/11: MRI shows abscess, vascular surgery consult planning I&D 06/13   Consultants:  Vascular surgery   Procedures: none  MRI L femur IMPRESSION: 1. There is moderate subcutaneous fat edema and swelling within the distal 50% of the medial left thigh and within the medial left knee. Within this subcutaneous fat swelling there is a walled-off abscess medial to the left medial femoral condyle measuring up to 2.9 cm in greatest dimension. 2. There is mild-to-moderate subcutaneous fat edema and swelling at the distal left tibial amputation stump, tracking along the distal lateral greater than distal medial subcutaneous fat. This subcutaneous fat edema extends to the level of the distal tibial bone amputation site, however no definite edema or enhancement is seen within the bone marrow to indicate acute osteomyelitis at this time. 3. There are at least 5 enlarged left inguinal lymph nodes, measuring up to 1.9 x 2.6 cm in transverse dimensions. These are presumably reactive.    ASSESSMENT & PLAN:   Principal Problem:   Left leg cellulitis Active Problems:   Essential hypertension   Diabetic neuropathy, type I diabetes mellitus (HCC)   Chronic pain syndrome   Diabetic peripheral neuropathy associated with type 2 diabetes mellitus (HCC)   Hyperlipidemia associated with type 2 diabetes mellitus (HCC)   Tobacco abuse disorder   History of depression   Necrotizing fasciitis of ankle and foot (HCC)   S/P BKA (below knee amputation), left (HCC)   Constipation   Major depressive  disorder, recurrent, in remission (HCC)   PAD (peripheral artery disease) (HCC)   Hypokalemia   Tobacco use   Left leg cellulitis LLE abscess  Blood cultures x 2 are in process Vancomycin and cefepime  Pain control Wound care consulted Vascular team consult - plan for I&D Thurs 06/13  Hypokalemia Check serum magnesium level on admission Status post potassium chloride 40 mEq PO one-time dose per EDP IV potassium chloride 10 mill equivalent, 4 doses ordered on admission EKG ordered on admission, pending completion at the time of this dictation Repeat BMP in a.m.  Essential hypertension Hydralazine 5 mg IV every 6 hours as needed for SBP greater than 175, 4 days ordered  Diabetic neuropathy, type I diabetes mellitus (HCC) Insulin pump  Chronic pain syndrome PDMP reviewed  Tobacco use As needed nicotine patch ordered Counseling given, patient does not appear to be ready to quit     DVT prophylaxis: heparin  Pertinent IV fluids/nutrition: no continuous IV fluids  Central lines / invasive devices: none  Code Status: FULL CODE ACP documentation reviewed: 04/30/23 none on file   Current Admission Status: inpatient   TOC needs / Dispo plan: likely for home when stable, no TOC needs at this time  Barriers to discharge / significant pending items: surgery in 2 days

## 2023-04-29 NOTE — Assessment & Plan Note (Signed)
-   Insulin SSI with agents coverage ordered ?

## 2023-04-29 NOTE — ED Provider Notes (Signed)
[facility] Provider Note    Event Date/Time   First MD Initiated Contact with Patient 04/29/23 2047     (approximate)   History   Wound Infection (Left medial - amputee)   HPI  Richard Mendoza is a 47 y.o. male with history of necrotizing fasciitis of the left ankle and foot status post BKA, type 2 diabetes, hyponatremia, hypokalemia, anemia and as listed in EMR presents to the emergency department for treatment and evaluation of pressure ulcer above the left knee. He noticed the wound about 2 weeks ago, but redness and pain started 2 days ago.  Patient states that the wound is due to his prosthesis. Wife reports subjective fever off and on for the past 24 hours. Blood sugar was hard to control yesterday and he used 40units last night.      Physical Exam   Triage Vital Signs: ED Triage Vitals  Enc Vitals Group     BP 04/29/23 1923 (!) 163/93     Pulse Rate 04/29/23 1921 95     Resp 04/29/23 1921 16     Temp 04/29/23 1921 98.4 F (36.9 C)     Temp src --      SpO2 04/29/23 1921 99 %     Weight 04/29/23 1923 205 lb 0.4 oz (93 kg)     Height 04/29/23 1923 5\' 10"  (1.778 m)     Head Circumference --      Peak Flow --      Pain Score 04/29/23 1923 4     Pain Loc --      Pain Edu? --      Excl. in GC? --     Most recent vital signs: Vitals:   04/29/23 1921 04/29/23 1923  BP:  (!) 163/93  Pulse: 95   Resp: 16   Temp: 98.4 F (36.9 C)   SpO2: 99%     General: Awake, no distress.  CV:  Good peripheral perfusion.  Resp:  Normal effort.  Abd:  No distention.  Other:     ED Results / Procedures / Treatments   Labs (all labs ordered are listed, but only abnormal results are displayed) Labs Reviewed  COMPREHENSIVE METABOLIC PANEL - Abnormal; Notable for the following components:      Result Value   Sodium 133 (*)    Potassium 2.7 (*)    CO2 21 (*)    Glucose, Bld 124 (*)    Creatinine, Ser 1.83 (*)    Calcium 8.5 (*)    Albumin 3.3  (*)    Total Bilirubin 1.4 (*)    GFR, Estimated 45 (*)    All other components within normal limits  CBC WITH DIFFERENTIAL/PLATELET - Abnormal; Notable for the following components:   WBC 16.6 (*)    Hemoglobin 12.3 (*)    HCT 36.8 (*)    Neutro Abs 13.6 (*)    All other components within normal limits  CULTURE, BLOOD (ROUTINE X 2)  CULTURE, BLOOD (ROUTINE X 2)  LACTIC ACID, PLASMA  URINALYSIS, ROUTINE W REFLEX MICROSCOPIC     EKG  Not indicated.   RADIOLOGY  Image and radiology report reviewed and interpreted by me. Radiology report consistent with the same.  Image of the left knee without evidence of body erosion.  PROCEDURES:  Critical Care performed: No  Procedures   MEDICATIONS ORDERED IN ED:  Medications  potassium chloride SA (KLOR-CON M) CR tablet 40 mEq (has no administration in time range)  vancomycin (VANCOREADY) IVPB 2000 mg/400 mL (2,000 mg Intravenous New Bag/Given 04/29/23 2153)  piperacillin-tazobactam (ZOSYN) IVPB 3.375 g (0 g Intravenous Stopped 04/29/23 2224)  ondansetron (ZOFRAN) injection 4 mg (4 mg Intravenous Given 04/29/23 2228)  HYDROmorphone (DILAUDID) injection 1 mg (1 mg Intravenous Given 04/29/23 2228)     IMPRESSION / MDM / ASSESSMENT AND PLAN / ED COURSE   I have reviewed the triage note.  Differential diagnosis includes, but is not limited to, cellulitis, sepsis, abscess, osteomyelitis  Patient's presentation is most consistent with acute presentation with potential threat to life or bodily function.  47 year old male presenting to the emergency department for treatment and evaluation of pressure ulcer and surrounding erythema and pain over the inner aspect of the left knee and thigh.  See HPI for further details.  Labs show white count of 16.6 with a neutrophil count of 13.6, glucose of 124 potassium of 2.7, BUN of 18 with a creatinine of 1.83 and a GFR 45.  Normal anion gap.  Vital signs are without indication of sepsis and  temperature is normal at this time 98.4.  Plan will be to get blood cultures and start vancomycin and Zosyn and plan to admit for IV antibiotics, hypokalemia, and further wound care.  Patient aware and agreeable to the plan.  Patient accepted by Dr. Sedalia Muta for admission to hospitalist service.      FINAL CLINICAL IMPRESSION(S) / ED DIAGNOSES   Final diagnoses:  Cellulitis of left lower extremity  Hypokalemia     Rx / DC Orders   ED Discharge Orders     None        Note:  This document was prepared using Dragon voice recognition software and may include unintentional dictation errors.   Chinita Pester, FNP 04/29/23 2233    Chesley Noon, MD 04/29/23 [phone]

## 2023-04-29 NOTE — Progress Notes (Signed)
Pharmacy Antibiotic Note  Richard Mendoza is a 47 y.o. male admitted on 04/29/2023 with cellulitis.  Pharmacy has been consulted for Zosyn & Vancomycin dosing for 7 days.  Plan: Zosyn 3.375g IV q8h (4 hour infusion).  Pt given Vancomycin 2000 mg once. Vancomycin 1500 mg IV Q 24 hrs. Goal AUC 400-550. Expected AUC: 474.9 SCr used: 1.83  Pharmacy will continue to follow and will adjust abx dosing whenever warranted.  Temp (24hrs), Avg:98.4 F (36.9 C), Min:98.4 F (36.9 C), Max:98.4 F (36.9 C)   Recent Labs  Lab 04/29/23 1923  WBC 16.6*  CREATININE 1.83*  LATICACIDVEN 1.3    Estimated Creatinine Clearance: 57.2 mL/min (A) (by C-G formula based on SCr of 1.83 mg/dL (H)).    No Known Allergies  Antimicrobials this admission: 6/10 Zosyn >> x 7 days 6/10 Vancomycin >> x 7 days  Microbiology results: 6/10 BCx: Pending  Thank you for allowing pharmacy to be a part of this patient's care.  Otelia Sergeant, PharmD, [facility] 04/29/2023 11:04 PM

## 2023-04-29 NOTE — Consult Note (Signed)
PHARMACY -  BRIEF ANTIBIOTIC NOTE   Pharmacy has received consult(s) for Vancomycin from an ED provider.  The patient's profile has been reviewed for ht/wt/allergies/indication/available labs.    One time order(s) placed for Vancomycin 2g IV.  Further antibiotics/pharmacy consults should be ordered by admitting physician if indicated.                       Thank you, Bettey Costa 04/29/2023  9:20 PM

## 2023-04-29 NOTE — ED Notes (Signed)
Vital signs stable. 

## 2023-04-29 NOTE — ED Triage Notes (Signed)
Pt to ed from home via POV from home for leg infection. Pt is caox4, in no acute distress. Pt is a below the knee amputee. Pt has fever and chills and redness to the knee wound.

## 2023-04-29 NOTE — ED Notes (Signed)
First set of cultures sent down 

## 2023-04-30 ENCOUNTER — Encounter: Payer: Self-pay | Admitting: Internal Medicine

## 2023-04-30 ENCOUNTER — Inpatient Hospital Stay: Payer: 59

## 2023-04-30 DIAGNOSIS — Z87891 Personal history of nicotine dependence: Secondary | ICD-10-CM | POA: Diagnosis not present

## 2023-04-30 DIAGNOSIS — L02416 Cutaneous abscess of left lower limb: Secondary | ICD-10-CM

## 2023-04-30 DIAGNOSIS — L03116 Cellulitis of left lower limb: Principal | ICD-10-CM

## 2023-04-30 DIAGNOSIS — Z89512 Acquired absence of left leg below knee: Secondary | ICD-10-CM | POA: Diagnosis not present

## 2023-04-30 LAB — BASIC METABOLIC PANEL
Anion gap: 7 (ref 5–15)
BUN: 16 mg/dL (ref 6–20)
CO2: 20 mmol/L — ABNORMAL LOW (ref 22–32)
Calcium: 7.6 mg/dL — ABNORMAL LOW (ref 8.9–10.3)
Chloride: 109 mmol/L (ref 98–111)
Creatinine, Ser: 1.54 mg/dL — ABNORMAL HIGH (ref 0.61–1.24)
GFR, Estimated: 56 mL/min — ABNORMAL LOW (ref >60)
Glucose, Bld: 161 mg/dL — ABNORMAL HIGH (ref 70–99)
Potassium: 2.8 mmol/L — ABNORMAL LOW (ref 3.5–5.1)
Sodium: 136 mmol/L (ref 135–145)

## 2023-04-30 LAB — CBC
HCT: 31.2 % — ABNORMAL LOW (ref 39.0–52.0)
Hemoglobin: 10.5 g/dL — ABNORMAL LOW (ref 13.0–17.0)
MCH: 28.3 pg (ref 26.0–34.0)
MCHC: 33.7 g/dL (ref 30.0–36.0)
MCV: 84.1 fL (ref 80.0–100.0)
Platelets: 176 10*3/uL (ref 150–400)
RBC: 3.71 MIL/uL — ABNORMAL LOW (ref 4.22–5.81)
RDW: 14.6 % (ref 11.5–15.5)
WBC: 12.6 10*3/uL — ABNORMAL HIGH (ref 4.0–10.5)
nRBC: 0 % (ref 0.0–0.2)

## 2023-04-30 LAB — URINALYSIS, ROUTINE W REFLEX MICROSCOPIC
Bacteria, UA: NONE SEEN
Bilirubin Urine: NEGATIVE
Glucose, UA: 150 mg/dL — AB
Ketones, ur: NEGATIVE mg/dL
Leukocytes,Ua: NEGATIVE
Nitrite: NEGATIVE
Protein, ur: 100 mg/dL — AB
Specific Gravity, Urine: 1.015 (ref 1.005–1.030)
pH: 6 (ref 5.0–8.0)

## 2023-04-30 LAB — HEMOGLOBIN A1C
Hgb A1c MFr Bld: 12.2 % — ABNORMAL HIGH (ref 4.8–5.6)
Mean Plasma Glucose: 303.44 mg/dL

## 2023-04-30 LAB — MAGNESIUM: Magnesium: 2.2 mg/dL (ref 1.7–2.4)

## 2023-04-30 LAB — CBG MONITORING, ED: Glucose-Capillary: 195 mg/dL — ABNORMAL HIGH (ref 70–99)

## 2023-04-30 MED ORDER — SODIUM CHLORIDE 0.9 % IV SOLN: INTRAVENOUS | Status: DC | PRN

## 2023-04-30 MED ORDER — MEDIHONEY WOUND/BURN DRESSING EX PSTE
1.0000 | PASTE | Freq: Every day | CUTANEOUS | Status: DC
  Administered 2023-04-30 – 2023-05-01 (×2): 1 via TOPICAL
  Filled 2023-04-30: qty 44

## 2023-04-30 MED ORDER — GADOBUTROL 1 MMOL/ML IV SOLN
9.0000 mL | Freq: Once | INTRAVENOUS | Status: AC | PRN
  Administered 2023-04-30: 9 mL via INTRAVENOUS

## 2023-04-30 MED ORDER — MORPHINE SULFATE (PF) 2 MG/ML IV SOLN: 2.0000 mg | INTRAVENOUS | Status: DC | PRN

## 2023-04-30 MED ORDER — HYDROMORPHONE HCL 1 MG/ML IJ SOLN
0.5000 mg | INTRAMUSCULAR | Status: DC | PRN
  Administered 2023-04-30 – 2023-05-01 (×4): 0.5 mg via INTRAVENOUS
  Filled 2023-04-30 (×4): qty 1

## 2023-04-30 MED ORDER — POTASSIUM CHLORIDE CRYS ER 20 MEQ PO TBCR
40.0000 meq | EXTENDED_RELEASE_TABLET | ORAL | Status: AC
  Administered 2023-04-30 (×2): 40 meq via ORAL
  Filled 2023-04-30 (×2): qty 2

## 2023-04-30 MED ORDER — VANCOMYCIN HCL 1750 MG/350ML IV SOLN
1750.0000 mg | INTRAVENOUS | Status: DC
  Administered 2023-04-30: 1750 mg via INTRAVENOUS
  Filled 2023-04-30: qty 350

## 2023-04-30 MED ORDER — INSULIN PUMP
Freq: Three times a day (TID) | SUBCUTANEOUS | Status: DC
  Administered 2023-04-30: 0.3 via SUBCUTANEOUS
  Administered 2023-05-02: 3.8 via SUBCUTANEOUS
  Administered 2023-05-02: 8.8 via SUBCUTANEOUS
  Administered 2023-05-03: 4.4 via SUBCUTANEOUS
  Administered 2023-05-03: 4.7 via SUBCUTANEOUS
  Administered 2023-05-03: 3.5 via SUBCUTANEOUS
  Administered 2023-05-03: 16.2 via SUBCUTANEOUS
  Filled 2023-04-30: qty 1

## 2023-04-30 MED ORDER — OXYCODONE HCL 5 MG PO TABS
5.0000 mg | ORAL_TABLET | ORAL | Status: DC | PRN
  Administered 2023-04-30: 10 mg via ORAL
  Filled 2023-04-30 (×2): qty 2

## 2023-04-30 NOTE — Inpatient Diabetes Management (Signed)
Inpatient Diabetes Program Recommendations  AACE/ADA: New Consensus Statement on Inpatient Glycemic Control   Target Ranges:  Prepandial:   less than 140 mg/dL      Peak postprandial:   less than 180 mg/dL (1-2 hours)      Critically ill patients:  140 - 180 mg/dL    Latest Reference Range & Units 04/30/23 02:31  Glucose-Capillary 70 - 99 mg/dL 161 (H)    Latest Reference Range & Units 04/29/23 19:23 04/30/23 05:40  Glucose 70 - 99 mg/dL 096 (H) 045 (H)  Hemoglobin A1C 4.8 - 5.6 %  12.2 (H)   Review of Glycemic Control  Diabetes history: DM2 Outpatient Diabetes medications: Fiasp in Medtronic insulin pump, Dexcom G6 Current orders for Inpatient glycemic control: Novolog 0-9 units TID with meals, Novolog 0-5 units QHS  Inpatient Diabetes Program Recommendations:    Insulin: Please discontinue Novolog correction and use Insulin Pump order set to order CBGs and insulin pump AC&HS and 2am.   HbgA1C: A1C 12.2% on 04/30/23 indicating an average glucose of 303 mg/dl over the past 2-3 months.  NOTE: Patient currently in ED and being admitted with left leg cellulitis. Per chart review, patient goes to [facility] and seen Jerolyn Center Younts, ANP on 12/26/22; A1C was 13.9% on 12/19/22.   Spoke with patient and wife at bedside in ED. Patient confirms that he uses a Medtronic insulin pump with Fiasp insulin and a Dexcom G6 CGM sensor. Patent currently has on both his insulin pump and Dexcom. Patient reports that he just filled up insulin pump this morning with insulin and he applied a new Dexcom G6 since they removed his other one in radiology. Patient reports that his glucose ranges 160-180's mg/dl. However, his wife reports that his glucose averages in 200's and she states that patient commonly removes his insulin pump at night before bed. Patient states he gets annoyed with the insulin pump and the tubing while he sleeps so he does take it off often at night while he sleeps.    Patient's wife reported that she adjust insulin pump settings at times. Patient plans to transition to OmniPod insulin pump; he has all new pump and supplies at home but he has not got started on it yet. Reviewed insulin pump settings which are as follows:  Current insulin pump settings are as follows:  Basal insulin  12A 3.6 units/hour Total daily basal insulin: 86.4 units/24 hours  Carb Coverage 1:4 1 unit for every 4 grams of carbohydrates  Insulin Sensitivity 1:20 1 unit drops blood glucose 20 mg/dl  Target Glucose Goals 40J 100-110 mg/dl  Active insulin: 4 hours  Discussed most current A1C of 12.2% on 04/30/23 and explained that current A1C indicates an average glucose of 303 mg/dl. Discussed how hyperglycemia impacts wound healing and leads to complications. Discussed with patient that he has Fiasp insulin in his pump which is short acting and only works for 4 hours; so if pump is removed while sleeping that his glucose will increase significantly. Asked patient not to remove pump while sleeping in order to get DM under better control. Encouraged patient to get started on OmniPod insulin pump in the near future as it is a pod and does not have tubing so it may work better for him while sleeping. Also discussed auto mode feature of OmniPod which will help with getting DM better controlled as well. Discussed glucose and A1C goals and encouraged patient to work with his Endocrinologist to get DM under control  to decrease risk of further complications from uncontrolled DM.  Patient would like to continue using his insulin pump while inpatient. Patient reports that the attending provider is aware he is on his insulin pump.  Patient verbalized understanding of information discussed and states that he does not have any further questions related to diabetes at this time. Sent Dr. Lyn Hollingshead chat message to ask to discontinue Novolog correction scale and order Insulin Pump order set with CBGs and  insulin pump AC&HS and 2am.   NURSING: Once insulin pump order set is ordered please print off the Patient insulin pump contract and flow sheet. The insulin pump contract should be signed by the patient and then placed in the chart. The patient insulin pump flow sheet will be completed by the patient at the bedside and the RN caring for the patient will use the patient's flow sheet to document in the [facility]. RN will need to complete the Nursing Insulin Pump Flowsheet at least once a shift. Patient will need to keep extra insulin pump supplies at the bedside at all times.    Thanks, Orlando Penner, RN, MSN, CDC[facility] Diabetes Coordinator Inpatient Diabetes Program [phone] (Team Pager from 8am to 5pm)

## 2023-04-30 NOTE — H&P (View-Only) (Signed)
Hospital Consult    Reason for Consult:  Left Lower extremity Ulcer with an abscess Requesting Physician:  Dr. Amy Cox MD MRN #:  [id number]  History of Present Illness: This is a 47 y.o. male with history of CKD 3A, history of necrotizing fasciitis status post BKA, insulin-dependent diabetes mellitus type 1, hypertension, depression, anxiety, neuropathy, chronic pain syndrome, who presents to the emergency department for chief concerns of infection of the left lower extremity.   On exam this afternoon the emergency department patient endorses he developed a wound on his left medial leg about 2 weeks ago that has become severe over the last 2 days.  It became warm and hot and started hurting more.  He endorses subjective fever and chills last evening.  His wife had asked that he come to the emergency department but he had declined.  This morning he woke up in severe pain and came to the emergency department.  Vascular surgery was consulted as he has been a patient of ours in the past to evaluate his left lower extremity.  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;  Surgeon: Cline, Todd, DPM;  Location: [facility] ORS;  Service: Podiatry;  Laterality: Left;   AMPUTATION Left 11/16/2020   Procedure: AMPUTATION BELOW KNEE;  Surgeon: Dew, Jason S, MD;  Location: [facility] ORS;  Service: Vascular;  Laterality: Left;   ANTERIOR CERVICAL DECOMP/DISCECTOMY FUSION N/A 09/09/2017   Procedure: ANTERIOR CERVICAL DECOMPRESSION/DISCECTOMY FUSION CERVICAL 6- CERVICAL 7;  Surgeon: Cabbell, Kyle, MD;  Location: [facility] OR;  Service: Neurosurgery;  Laterality: N/A;  ANTERIOR CERVICAL DECOMPRESSION/DISCECTOMY FUSION CERVICAL 6-  CERVICAL 7   APPENDECTOMY     I & D EXTREMITY Right 10/03/2017   Procedure: IRRIGATION AND DEBRIDEMENT RIGHT WRIST;  Surgeon: Kuzma, Kevin, MD;  Location: [facility] OR;  Service: Orthopedics;  Laterality: Right;   I & D EXTREMITY Right 11/26/2018   Procedure: IRRIGATION AND DEBRIDEMENT FASCIA ON RIGHT FOOT;  Surgeon: Fowler, Justin, DPM;  Location: [facility] ORS;  Service: Podiatry;  Laterality: Right;   INCISION AND DRAINAGE Right 03/06/2019   Procedure: INCISION AND DRAINAGE RIGHT FOOT, WITH 4th RAY AMPUTATION;  Surgeon: Fowler, Justin, DPM;  Location: [facility] OR[facility];  Service: Podiatry;  Laterality: Right;   INCISION AND DRAINAGE Left 11/07/2020   Procedure: INCISION AND DRAINAGE;  Surgeon: Cline, Todd, DPM;  Location: [facility] OR[facility];  Service: Podiatry;  Laterality: Left;   IRRIGATION AND DEBRIDEMENT FOOT Left 11/11/2020   Procedure: IRRIGATION AND DEBRIDEMENT FOOT;  Surgeon: Cline, Todd, DPM;  Location: [facility] OR[facility];  Service: Podiatry;  Laterality: Left;   METATARSAL HEAD EXCISION Right 05/15/2019   Procedure: OSTECTOMY;MET HEAD 5;  Surgeon: Fowler, Justin, DPM;  Location: [facility] ORS;  Service: Podiatry;  Laterality: Right;   osteomylitis     ROTATOR CUFF REPAIR Left     No Known Allergies  Prior to Admission medications   Medication Sig Start Date End Date Taking? Authorizing Provider  atorvastatin (LIPITOR) 40 MG tablet Take 40 mg by mouth daily. 08/23/17  Yes [provider]  benazepril (LOTENSIN) 20 MG tablet Take 20 mg by mouth daily. 06/01/21  Yes [provider]  [employer] SENNA 8.6 MG tablet Take 1 tablet by mouth 2 (two) times   daily as needed for constipation. 10/31/17  Yes [provider]  FIASP 100 UNIT/ML SOLN Inject into the skin continuous. Via pump 10/09/20  Yes [provider]  busPIRone (BUSPAR) 15 MG tablet Take 15 mg by mouth 2 (two) times daily. Patient not taking: Reported on 04/30/2023    [provider]  collagenase (SANTYL) ointment Apply topically  daily. Patient not taking: Reported on 10/26/2022 09/10/21   Amin, Sumayya, MD  dicyclomine (BENTYL) 10 MG capsule Take 10 mg by mouth in the morning and at bedtime. Patient not taking: Reported on 10/26/2022 04/27/20   [provider]  doxycycline (VIBRAMYCIN) 100 MG capsule Take 1 capsule (100 mg total) by mouth 2 (two) times daily. Patient not taking: Reported on 10/26/2022 07/12/22   Brown, Fallon E, NP  famotidine (PEPCID) 20 MG tablet Take 20 mg by mouth daily. Patient not taking: Reported on 10/26/2022    [provider]  linaclotide (LINZESS) 72 MCG capsule Take 72 mcg by mouth daily as needed (constipation). Patient not taking: Reported on 10/26/2022    [provider]  methocarbamol (ROBAXIN) 500 MG tablet Take 2 tablets by mouth 2 (two) times daily as needed for muscle spasms. Patient not taking: Reported on 10/26/2022 03/17/21   [provider]  metoCLOPramide (REGLAN) 10 MG tablet Take 10 mg by mouth daily. Patient not taking: Reported on 10/26/2022 05/25/19   [provider]  ondansetron (ZOFRAN) 8 MG tablet Take 8 mg by mouth every 6 (six) hours as needed for nausea/vomiting. Patient not taking: Reported on 10/26/2022    [provider]  oxyCODONE (OXY IR/ROXICODONE) 5 MG immediate release tablet Take 1 tablet (5 mg total) by mouth every 4 (four) hours as needed for moderate pain or severe pain. Patient not taking: Reported on 10/26/2022 11/21/20   Stegmayer, Kimberly A, PA-C  pantoprazole (PROTONIX) 40 MG tablet Take 40 mg by mouth daily. Patient not taking: Reported on 10/26/2022 08/23/17   [provider]  pregabalin (LYRICA) 75 MG capsule Take 1 capsule (75 mg total) by mouth 2 (two) times daily. Patient not taking: Reported on 04/30/2023 12/03/22   Brown, Fallon E, NP  sertraline (ZOLOFT) 100 MG tablet Take 100 mg by mouth daily. Patient not taking: Reported on 04/30/2023 10/31/17   [provider]  sildenafil (REVATIO) 20  MG tablet Take 100 mg by mouth daily as needed (erectile dysfunction). Patient not taking: Reported on 04/30/2023    [provider]  traMADol (ULTRAM) 50 MG tablet Take 1 tablet (50 mg total) by mouth every 6 (six) hours as needed. Patient not taking: Reported on 04/30/2023 12/03/22   Brown, Fallon E, NP  traZODone (DESYREL) 100 MG tablet Take 100 mg by mouth at bedtime. Patient not taking: Reported on 10/26/2022    [provider]  vitamin B-12 (CYANOCOBALAMIN) 1000 MCG tablet Take 1,000 mcg by mouth in the morning and at bedtime.  Patient not taking: Reported on 10/26/2022 01/27/19   [provider]    Social History   Socioeconomic History   Marital status: Married    Spouse name: Not on file   Number of children: 4   Years of education: Not on file   Highest education level: Not on file  Occupational History   Not on file  Tobacco Use   Smoking status: Former    Packs/day: 1.00    Years: 17.00    Additional pack years: 0.00    Total pack years: 17.00      Types: Cigarettes    Quit date: 01/14/2019    Years since quitting: 4.2   Smokeless tobacco: Never  Vaping Use   Vaping Use: Never used  Substance and Sexual Activity   Alcohol use: Yes    Comment: rare   Drug use: No   Sexual activity: Yes  Other Topics Concern   Not on file  Social History Narrative   Not on file   Social Determinants of Health   Financial Resource Strain: Not on file  Food Insecurity: No Food Insecurity (04/30/2023)   Hunger Vital Sign    Worried About Running Out of Food in the Last Year: Never true    Ran Out of Food in the Last Year: Never true  Transportation Needs: No Transportation Needs (04/30/2023)   PRAPARE - Transportation    Lack of Transportation (Medical): No    Lack of Transportation (Non-Medical): No  Physical Activity: Not on file  Stress: Not on file  Social Connections: Not on file  Intimate Partner Violence: Not At Risk (04/30/2023)   Humiliation,  Afraid, Rape, and Kick questionnaire    Fear of Current or Ex-Partner: No    Emotionally Abused: No    Physically Abused: No    Sexually Abused: No     Family History  Problem Relation Age of Onset   Diabetes Mother    Heart disease Father    Diabetes Father    Arthritis Other    Hyperlipidemia Other    Hypertension Other    Cancer Other        breast   Mental illness Neg Hx     ROS: Otherwise negative unless mentioned in HPI  Physical Examination  Vitals:   04/30/23 0828 04/30/23 1056  BP: (!) 160/97   Pulse: 74   Resp: 18   Temp:  97.7 F (36.5 C)  SpO2: 100%    Body mass index is 29.42 kg/m.  General:  WDWN in NAD Gait: Not observed HENT: WNL, normocephalic Pulmonary: normal non-labored breathing, without Rales, rhonchi,  wheezing Cardiac: regular, without  Murmurs, rubs or gallops; without carotid bruits Abdomen: Positive bowel sounds throughout, soft, NT/ND, no masses Skin: without rashes Vascular Exam/Pulses: bilateral  upper extremities with palpable pulses, Right lower extremity with palpable PT pulse but no DP pulse.  Extremities: without ischemic changes, without Gangrene , with cellulitis; with open wounds;  Musculoskeletal: no muscle wasting or atrophy  Neurologic: A&O X 3;  No focal weakness or paresthesias are detected; speech is fluent/normal Psychiatric:  The pt has Normal affect. Lymph:  Unremarkable  CBC    Component Value Date/Time   WBC 12.6 (H) 04/30/2023 0540   RBC 3.71 (L) 04/30/2023 0540   HGB 10.5 (L) 04/30/2023 0540   HCT 31.2 (L) 04/30/2023 0540   PLT 176 04/30/2023 0540   MCV 84.1 04/30/2023 0540   MCH 28.3 04/30/2023 0540   MCHC 33.7 04/30/2023 0540   RDW 14.6 04/30/2023 0540   LYMPHSABS 1.9 04/29/2023 1923   MONOABS 0.9 04/29/2023 1923   EOSABS 0.1 04/29/2023 1923   BASOSABS 0.1 04/29/2023 1923    BMET    Component Value Date/Time   NA 136 04/30/2023 0540   K 2.8 (L) 04/30/2023 0540   CL 109 04/30/2023 0540   CO2  20 (L) 04/30/2023 0540   GLUCOSE 161 (H) 04/30/2023 0540   BUN 16 04/30/2023 0540   CREATININE 1.54 (H) 04/30/2023 0540   CALCIUM 7.6 (L) 04/30/2023 0540   GFRNONAA 56 (L) 04/30/2023   0540   GFRAA >60 05/05/2020 1930    COAGS: Lab Results  Component Value Date   INR 1.1 09/06/2021   INR 0.9 12/21/2020   INR 1.2 11/07/2020     Non-Invasive Vascular Imaging:   EXAM:04/30/2023 MR OF THE LEFT LOWER EXTREMITY WITHOUT AND WITH CONTRAST; MRI OF THE LEFT KNEE WITHOUT AND WITH CONTRAST   TECHNIQUE: Multiplanar, multisequence MR imaging of the left femur was performed both before and after administration of intravenous contrast.   Multiplanar, multisequence MR imaging of the left knee was performed both before and after administration of intravenous contrast.   CONTRAST:  9mL GADAVIST GADOBUTROL 1 MMOL/ML IV SOLN   COMPARISON:  left knee radiographs 04/29/2023, 11/25/2022, 07/12/2022   FINDINGS: Left femur:   Bones/Joint/Cartilage   Normal marrow signal within the left femur. The cortices are intact. No marrow edema.   Enlarged of the images there is moderate bilateral femoroacetabular cartilage thinning and femoral head-neck junction degenerative osteophytes.   There is a 13 mm decreased T1 decreased T2 signal sclerotic focus within the distal medial right femoral neck junction with the intertrochanteric region, compatible with a benign bone island. Overlying cortex is intact (coronal series 7, image 18).   Muscles and Tendons   Normal signal and size of the left thigh musculature.   Soft tissues   There is moderate subcutaneous fat edema and swelling within the distal 50% of the medial left thigh. At the level of the medial femoral condyle there is an oval predominantly decreased T1 increased T2 signal walled-off collection with internal septations and peripheral wall enhancement, measuring up to approximately 1.8 x 2.3 x 2.9 cm (transverse by AP by craniocaudal  (axial series 17, image 84 and coronal series 11 image 16). This is suspicious for an abscess, superficial to the medial collateral ligament. There is mild coalescing fluid within the subcutaneous fat around this walled-off abscess.   There are at least 5 enlarged left inguinal lymph nodes, measuring up to 1.9 x 2.6 cm in transverse dimensions (axial series 17, image 25). These are presumably reactive.   --   Left knee:   MENISCI   Medial meniscus:  Intact.   Lateral meniscus:  Intact.   LIGAMENTS   Cruciates: The ACL and PCL are intact.   Collaterals: The medial collateral ligament is intact. The fibular collateral ligament, biceps femoris tendon, iliotibial band, and popliteus tendon are intact.   CARTILAGE   Patellofemoral:  Intact.   Medial:  Intact.   Lateral:  Intact.   Joint: Nojoint effusion. Normal Hoffa's fat pad. No plical thickening.   Extensor Mechanism: Mild chronic enthesopathic change at the quadriceps insertion on the patella. The quadriceps tendon insertion is intact. Moderate chronic enthesopathic change at the patellar tendon origin. Moderate intermediate T2 signal proximal patellar tendinosis. Moderate chronic enthesopathic change at the patellar tendon insertion on the tibial tubercle with moderate intermediate T2 signal and thickening insertional tendinosis.   Bones: Postsurgical changes are seen of amputation at the level of the proximal tibial diaphysis. The cortices are intact. No acute fracture.   Soft tissues: There is moderate edema and swelling of the medial knee subcutaneous fat, contiguous with the distal thigh described above. Rim enhancing abscess within the subcutaneous fat medial to the medial femoral condyle as described above.   There is metallic susceptibility artifact within the distal left lower extremity amputation soft tissues consistent with postsurgical change. There is mild-to-moderate edema and swelling of  the subcutaneous fat at the distal amputation   stump, tracking along the distal lateral subcutaneous fat greater than distal medial subcutaneous fat (coronal series 13 images 4 through 19 and sagittal series 16 images 12 through 37). This subcutaneous fat edema extends to the level of the distal tibial bone amputation site, however no definite edema or enhancement is seen within the bone marrow to indicate acute osteomyelitis.   IMPRESSION: 1. There is moderate subcutaneous fat edema and swelling within the distal 50% of the medial left thigh and within the medial left knee. Within this subcutaneous fat swelling there is a walled-off abscess medial to the left medial femoral condyle measuring up to 2.9 cm in greatest dimension. 2. There is mild-to-moderate subcutaneous fat edema and swelling at the distal left tibial amputation stump, tracking along the distal lateral greater than distal medial subcutaneous fat. This subcutaneous fat edema extends to the level of the distal tibial bone amputation site, however no definite edema or enhancement is seen within the bone marrow to indicate acute osteomyelitis at this time. 3. There are at least 5 enlarged left inguinal lymph nodes, measuring up to 1.9 x 2.6 cm in transverse dimensions. These are presumably reactive.  Statin:  Yes.   Beta Blocker:  No. Aspirin:  No. ACEI:  Yes.   ARB:  No. CCB use:  No Other antiplatelets/anticoagulants:  No.    ASSESSMENT/PLAN: This is a 47 y.o. male who presents to [facility]'s emergency department with fever chills and infection to his left lower extremity.  Patient has an open sore on the medial aspect of his left knee which she states worsened 2 days ago.  On further workup in the emergency department an MRI of his left lower extremity was done.  This showed moderate subcutaneous fat edema and swelling of the medial left thigh within the medial left knee.  It appears there is a walled off abscess medial to  the left femoral condyle measuring 2.9 cm at its greatest dimension.  PLAN: Vascular surgery will take the patient to the operating room on Thursday, 05/02/2023 for an incision and drainage of this left femoral condyle abscess.  I discussed in detail the procedure, benefits, risks, and complications.  The patient verbalizes his understanding.  I answered all the patient's questions.  He wishes to proceed as soon as possible.  Patient will be made n.p.o. midnight before surgery.   -I discussed in detail the plan with Dr. Jason Dew MD and he agrees with the plan.   Verneta Hamidi R Makenlee Mckeag [facility] 04/30/2023 2:42 PM  

## 2023-04-30 NOTE — Consult Note (Signed)
Hospital Consult    Reason for Consult:  Left Lower extremity Ulcer with an abscess Requesting Physician:  Dr. Londell Moh MD MRN #:  [id number]  History of Present Illness: This is a 47 y.o. male with history of CKD 3A, history of necrotizing fasciitis status post BKA, insulin-dependent diabetes mellitus type 1, hypertension, depression, anxiety, neuropathy, chronic pain syndrome, who presents to the emergency department for chief concerns of infection of the left lower extremity.   On exam this afternoon the emergency department patient endorses he developed a wound on his left medial leg about 2 weeks ago that has become severe over the last 2 days.  It became warm and hot and started hurting more.  He endorses subjective fever and chills last evening.  His wife had asked that he come to the emergency department but he had declined.  This morning he woke up in severe pain and came to the emergency department.  Vascular surgery was consulted as he has been a patient of ours in the past to evaluate his left lower extremity.  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;  Surgeon: Linus Galas, DPM;  Location: [facility];  Service: Podiatry;  Laterality: Left;   AMPUTATION Left 11/16/2020   Procedure: AMPUTATION BELOW KNEE;  Surgeon: Annice Needy, MD;  Location: [facility];  Service: Vascular;  Laterality: Left;   ANTERIOR CERVICAL DECOMP/DISCECTOMY FUSION N/A 09/09/2017   Procedure: ANTERIOR CERVICAL DECOMPRESSION/DISCECTOMY FUSION CERVICAL 6- CERVICAL 7;  Surgeon: Coletta Memos, MD;  Location: [facility];  Service: Neurosurgery;  Laterality: N/A;  ANTERIOR CERVICAL DECOMPRESSION/DISCECTOMY FUSION CERVICAL 6-  CERVICAL 7   APPENDECTOMY     I & D EXTREMITY Right 10/03/2017   Procedure: IRRIGATION AND DEBRIDEMENT RIGHT WRIST;  Surgeon: Betha Loa, MD;  Location: [facility];  Service: Orthopedics;  Laterality: Right;   I & D EXTREMITY Right 11/26/2018   Procedure: IRRIGATION AND DEBRIDEMENT FASCIA ON RIGHT FOOT;  Surgeon: Gwyneth Revels, DPM;  Location: [facility];  Service: Podiatry;  Laterality: Right;   INCISION AND DRAINAGE Right 03/06/2019   Procedure: INCISION AND DRAINAGE RIGHT FOOT, WITH 4th RAY AMPUTATION;  Surgeon: Gwyneth Revels, DPM;  Location: [facility];  Service: Podiatry;  Laterality: Right;   INCISION AND DRAINAGE Left 11/07/2020   Procedure: INCISION AND DRAINAGE;  Surgeon: Linus Galas, DPM;  Location: [facility];  Service: Podiatry;  Laterality: Left;   IRRIGATION AND DEBRIDEMENT FOOT Left 11/11/2020   Procedure: IRRIGATION AND DEBRIDEMENT FOOT;  Surgeon: Linus Galas, DPM;  Location: [facility];  Service: Podiatry;  Laterality: Left;   METATARSAL HEAD EXCISION Right 05/15/2019   Procedure: OSTECTOMY;MET HEAD 5;  Surgeon: Gwyneth Revels, DPM;  Location: [facility];  Service: Podiatry;  Laterality: Right;   osteomylitis     ROTATOR CUFF REPAIR Left     No Known Allergies  Prior to Admission medications   Medication Sig Start Date End Date Taking? Authorizing Provider  atorvastatin (LIPITOR) 40 MG tablet Take 40 mg by mouth daily. 08/23/17  Yes [provider]  benazepril (LOTENSIN) 20 MG tablet Take 20 mg by mouth daily. 06/01/21  Yes [provider]  [facility] SENNA 8.6 MG tablet Take 1 tablet by mouth 2 (two) times  daily as needed for constipation. 10/31/17  Yes [provider]  FIASP 100 UNIT/ML SOLN Inject into the skin continuous. Via pump 10/09/20  Yes [provider]  busPIRone (BUSPAR) 15 MG tablet Take 15 mg by mouth 2 (two) times daily. Patient not taking: Reported on 04/30/2023    [provider]  collagenase (SANTYL) ointment Apply topically  daily. Patient not taking: Reported on 10/26/2022 09/10/21   Arnetha Courser, MD  dicyclomine (BENTYL) 10 MG capsule Take 10 mg by mouth in the morning and at bedtime. Patient not taking: Reported on 10/26/2022 04/27/20   [provider]  doxycycline (VIBRAMYCIN) 100 MG capsule Take 1 capsule (100 mg total) by mouth 2 (two) times daily. Patient not taking: Reported on 10/26/2022 07/12/22   Georgiana Spinner, NP  famotidine (PEPCID) 20 MG tablet Take 20 mg by mouth daily. Patient not taking: Reported on 10/26/2022    [provider]  linaclotide (LINZESS) 72 MCG capsule Take 72 mcg by mouth daily as needed (constipation). Patient not taking: Reported on 10/26/2022    [provider]  methocarbamol (ROBAXIN) 500 MG tablet Take 2 tablets by mouth 2 (two) times daily as needed for muscle spasms. Patient not taking: Reported on 10/26/2022 03/17/21   [provider]  metoCLOPramide (REGLAN) 10 MG tablet Take 10 mg by mouth daily. Patient not taking: Reported on 10/26/2022 05/25/19   [provider]  ondansetron (ZOFRAN) 8 MG tablet Take 8 mg by mouth every 6 (six) hours as needed for nausea/vomiting. Patient not taking: Reported on 10/26/2022    [provider]  oxyCODONE (OXY IR/ROXICODONE) 5 MG immediate release tablet Take 1 tablet (5 mg total) by mouth every 4 (four) hours as needed for moderate pain or severe pain. Patient not taking: Reported on 10/26/2022 11/21/20   Stegmayer, Cala Bradford A, PA-C  pantoprazole (PROTONIX) 40 MG tablet Take 40 mg by mouth daily. Patient not taking: Reported on 10/26/2022 08/23/17   [provider]  pregabalin (LYRICA) 75 MG capsule Take 1 capsule (75 mg total) by mouth 2 (two) times daily. Patient not taking: Reported on 04/30/2023 12/03/22   Georgiana Spinner, NP  sertraline (ZOLOFT) 100 MG tablet Take 100 mg by mouth daily. Patient not taking: Reported on 04/30/2023 10/31/17   [provider]  sildenafil (REVATIO) 20  MG tablet Take 100 mg by mouth daily as needed (erectile dysfunction). Patient not taking: Reported on 04/30/2023    [provider]  traMADol (ULTRAM) 50 MG tablet Take 1 tablet (50 mg total) by mouth every 6 (six) hours as needed. Patient not taking: Reported on 04/30/2023 12/03/22   Georgiana Spinner, NP  traZODone (DESYREL) 100 MG tablet Take 100 mg by mouth at bedtime. Patient not taking: Reported on 10/26/2022    [provider]  vitamin B-12 (CYANOCOBALAMIN) 1000 MCG tablet Take 1,000 mcg by mouth in the morning and at bedtime.  Patient not taking: Reported on 10/26/2022 01/27/19   [provider]    Social History   Socioeconomic History   Marital status: Married    Spouse name: Not on file   Number of children: 4   Years of education: Not on file   Highest education level: Not on file  Occupational History   Not on file  Tobacco Use   Smoking status: Former    Packs/day: 1.00    Years: 17.00    Additional pack years: 0.00    Total pack years: 17.00  Types: Cigarettes    Quit date: 01/14/2019    Years since quitting: 4.2   Smokeless tobacco: Never  Vaping Use   Vaping Use: Never used  Substance and Sexual Activity   Alcohol use: Yes    Comment: rare   Drug use: No   Sexual activity: Yes  Other Topics Concern   Not on file  Social History Narrative   Not on file   Social Determinants of Health   Financial Resource Strain: Not on file  Food Insecurity: No Food Insecurity (04/30/2023)   Hunger Vital Sign    Worried About Running Out of Food in the Last Year: Never true    Ran Out of Food in the Last Year: Never true  Transportation Needs: No Transportation Needs (04/30/2023)   PRAPARE - Administrator, Civil Service (Medical): No    Lack of Transportation (Non-Medical): No  Physical Activity: Not on file  Stress: Not on file  Social Connections: Not on file  Intimate Partner Violence: Not At Risk (04/30/2023)   Humiliation,  Afraid, Rape, and Kick questionnaire    Fear of Current or Ex-Partner: No    Emotionally Abused: No    Physically Abused: No    Sexually Abused: No     Family History  Problem Relation Age of Onset   Diabetes Mother    Heart disease Father    Diabetes Father    Arthritis Other    Hyperlipidemia Other    Hypertension Other    Cancer Other        breast   Mental illness Neg Hx     ROS: Otherwise negative unless mentioned in HPI  Physical Examination  Vitals:   04/30/23 0828 04/30/23 1056  BP: (!) 160/97   Pulse: 74   Resp: 18   Temp:  97.7 F (36.5 C)  SpO2: 100%    Body mass index is 29.42 kg/m.  General:  WDWN in NAD Gait: Not observed HENT: WNL, normocephalic Pulmonary: normal non-labored breathing, without Rales, rhonchi,  wheezing Cardiac: regular, without  Murmurs, rubs or gallops; without carotid bruits Abdomen: Positive bowel sounds throughout, soft, NT/ND, no masses Skin: without rashes Vascular Exam/Pulses: bilateral  upper extremities with palpable pulses, Right lower extremity with palpable PT pulse but no DP pulse.  Extremities: without ischemic changes, without Gangrene , with cellulitis; with open wounds;  Musculoskeletal: no muscle wasting or atrophy  Neurologic: A&O X 3;  No focal weakness or paresthesias are detected; speech is fluent/normal Psychiatric:  The pt has Normal affect. Lymph:  Unremarkable  CBC    Component Value Date/Time   WBC 12.6 (H) 04/30/2023 0540   RBC 3.71 (L) 04/30/2023 0540   HGB 10.5 (L) 04/30/2023 0540   HCT 31.2 (L) 04/30/2023 0540   PLT 176 04/30/2023 0540   MCV 84.1 04/30/2023 0540   MCH 28.3 04/30/2023 0540   MCHC 33.7 04/30/2023 0540   RDW 14.6 04/30/2023 0540   LYMPHSABS 1.9 04/29/2023 1923   MONOABS 0.9 04/29/2023 1923   EOSABS 0.1 04/29/2023 1923   BASOSABS 0.1 04/29/2023 1923    BMET    Component Value Date/Time   NA 136 04/30/2023 0540   K 2.8 (L) 04/30/2023 0540   CL 109 04/30/2023 0540   CO2  20 (L) 04/30/2023 0540   GLUCOSE 161 (H) 04/30/2023 0540   BUN 16 04/30/2023 0540   CREATININE 1.54 (H) 04/30/2023 0540   CALCIUM 7.6 (L) 04/30/2023 0540   GFRNONAA 56 (L) 04/30/2023  0540   GFRAA >60 05/05/2020 1930    COAGS: Lab Results  Component Value Date   INR 1.1 09/06/2021   INR 0.9 12/21/2020   INR 1.2 11/07/2020     Non-Invasive Vascular Imaging:   EXAM:04/30/2023 MR OF THE LEFT LOWER EXTREMITY WITHOUT AND WITH CONTRAST; MRI OF THE LEFT KNEE WITHOUT AND WITH CONTRAST   TECHNIQUE: Multiplanar, multisequence MR imaging of the left femur was performed both before and after administration of intravenous contrast.   Multiplanar, multisequence MR imaging of the left knee was performed both before and after administration of intravenous contrast.   CONTRAST:  9mL GADAVIST GADOBUTROL 1 MMOL/ML IV SOLN   COMPARISON:  left knee radiographs 04/29/2023, 11/25/2022, 07/12/2022   FINDINGS: Left femur:   Bones/Joint/Cartilage   Normal marrow signal within the left femur. The cortices are intact. No marrow edema.   Enlarged of the images there is moderate bilateral femoroacetabular cartilage thinning and femoral head-neck junction degenerative osteophytes.   There is a 13 mm decreased T1 decreased T2 signal sclerotic focus within the distal medial right femoral neck junction with the intertrochanteric region, compatible with a benign bone island. Overlying cortex is intact (coronal series 7, image 18).   Muscles and Tendons   Normal signal and size of the left thigh musculature.   Soft tissues   There is moderate subcutaneous fat edema and swelling within the distal 50% of the medial left thigh. At the level of the medial femoral condyle there is an oval predominantly decreased T1 increased T2 signal walled-off collection with internal septations and peripheral wall enhancement, measuring up to approximately 1.8 x 2.3 x 2.9 cm (transverse by AP by craniocaudal  (axial series 17, image 84 and coronal series 11 image 16). This is suspicious for an abscess, superficial to the medial collateral ligament. There is mild coalescing fluid within the subcutaneous fat around this walled-off abscess.   There are at least 5 enlarged left inguinal lymph nodes, measuring up to 1.9 x 2.6 cm in transverse dimensions (axial series 17, image 25). These are presumably reactive.   --   Left knee:   MENISCI   Medial meniscus:  Intact.   Lateral meniscus:  Intact.   LIGAMENTS   Cruciates: The ACL and PCL are intact.   Collaterals: The medial collateral ligament is intact. The fibular collateral ligament, biceps femoris tendon, iliotibial band, and popliteus tendon are intact.   CARTILAGE   Patellofemoral:  Intact.   Medial:  Intact.   Lateral:  Intact.   Joint: Nojoint effusion. Normal Hoffa's fat pad. No plical thickening.   Extensor Mechanism: Mild chronic enthesopathic change at the quadriceps insertion on the patella. The quadriceps tendon insertion is intact. Moderate chronic enthesopathic change at the patellar tendon origin. Moderate intermediate T2 signal proximal patellar tendinosis. Moderate chronic enthesopathic change at the patellar tendon insertion on the tibial tubercle with moderate intermediate T2 signal and thickening insertional tendinosis.   Bones: Postsurgical changes are seen of amputation at the level of the proximal tibial diaphysis. The cortices are intact. No acute fracture.   Soft tissues: There is moderate edema and swelling of the medial knee subcutaneous fat, contiguous with the distal thigh described above. Rim enhancing abscess within the subcutaneous fat medial to the medial femoral condyle as described above.   There is metallic susceptibility artifact within the distal left lower extremity amputation soft tissues consistent with postsurgical change. There is mild-to-moderate edema and swelling of  the subcutaneous fat at the distal amputation  stump, tracking along the distal lateral subcutaneous fat greater than distal medial subcutaneous fat (coronal series 13 images 4 through 19 and sagittal series 16 images 12 through 37). This subcutaneous fat edema extends to the level of the distal tibial bone amputation site, however no definite edema or enhancement is seen within the bone marrow to indicate acute osteomyelitis.   IMPRESSION: 1. There is moderate subcutaneous fat edema and swelling within the distal 50% of the medial left thigh and within the medial left knee. Within this subcutaneous fat swelling there is a walled-off abscess medial to the left medial femoral condyle measuring up to 2.9 cm in greatest dimension. 2. There is mild-to-moderate subcutaneous fat edema and swelling at the distal left tibial amputation stump, tracking along the distal lateral greater than distal medial subcutaneous fat. This subcutaneous fat edema extends to the level of the distal tibial bone amputation site, however no definite edema or enhancement is seen within the bone marrow to indicate acute osteomyelitis at this time. 3. There are at least 5 enlarged left inguinal lymph nodes, measuring up to 1.9 x 2.6 cm in transverse dimensions. These are presumably reactive.  Statin:  Yes.   Beta Blocker:  No. Aspirin:  No. ACEI:  Yes.   ARB:  No. CCB use:  No Other antiplatelets/anticoagulants:  No.    ASSESSMENT/PLAN: This is a 47 y.o. male who presents to [facility] emergency department with fever chills and infection to his left lower extremity.  Patient has an open sore on the medial aspect of his left knee which she states worsened 2 days ago.  On further workup in the emergency department an MRI of his left lower extremity was done.  This showed moderate subcutaneous fat edema and swelling of the medial left thigh within the medial left knee.  It appears there is a walled off abscess medial to  the left femoral condyle measuring 2.9 cm at its greatest dimension.  PLAN: Vascular surgery will take the patient to the operating room on Thursday, 05/02/2023 for an incision and drainage of this left femoral condyle abscess.  I discussed in detail the procedure, benefits, risks, and complications.  The patient verbalizes his understanding.  I answered all the patient's questions.  He wishes to proceed as soon as possible.  Patient will be made n.p.o. midnight before surgery.   -I discussed in detail the plan with Dr. Festus Barren MD and he agrees with the plan.   Marcie Bal [facility] 04/30/2023 2:42 PM

## 2023-04-30 NOTE — Progress Notes (Signed)
PROGRESS NOTE    Richard Mendoza   ZOX:[id number] DOB: July 07, 1976  DOA: 04/29/2023 Date of Service: 04/30/23 PCP: Barbette Reichmann, MD     Brief Narrative / Hospital Course:  Richard Mendoza is a 47 year old male with history of CKD 3A, history of necrotizing fasciitis status post L BKA, insulin-dependent diabetes mellitus type 1, hypertension, depression, anxiety, neuropathy, chronic pain syndrome, who presents to the emergency department for chief concerns of infection of the left lower extremity. 06/10: VSS, hypokalemia, AKI, leukocytosis, Admitted for cellulitis LLE.  06/11: MRI shows abscess, vascular surgery consult planning I&D 06/13   Consultants:  Vascular surgery   Procedures: none  MRI L femur IMPRESSION: 1. There is moderate subcutaneous fat edema and swelling within the distal 50% of the medial left thigh and within the medial left knee. Within this subcutaneous fat swelling there is a walled-off abscess medial to the left medial femoral condyle measuring up to 2.9 cm in greatest dimension. 2. There is mild-to-moderate subcutaneous fat edema and swelling at the distal left tibial amputation stump, tracking along the distal lateral greater than distal medial subcutaneous fat. This subcutaneous fat edema extends to the level of the distal tibial bone amputation site, however no definite edema or enhancement is seen within the bone marrow to indicate acute osteomyelitis at this time. 3. There are at least 5 enlarged left inguinal lymph nodes, measuring up to 1.9 x 2.6 cm in transverse dimensions. These are presumably reactive.    ASSESSMENT & PLAN:   Principal Problem:   Left leg cellulitis Active Problems:   Essential hypertension   Diabetic neuropathy, type I diabetes mellitus (HCC)   Chronic pain syndrome   Diabetic peripheral neuropathy associated with type 2 diabetes mellitus (HCC)   Hyperlipidemia associated with type 2 diabetes mellitus (HCC)    Tobacco abuse disorder   History of depression   Necrotizing fasciitis of ankle and foot (HCC)   S/P BKA (below knee amputation), left (HCC)   Constipation   Major depressive disorder, recurrent, in remission (HCC)   PAD (peripheral artery disease) (HCC)   Hypokalemia   Tobacco use   Left leg cellulitis LLE abscess  Blood cultures x 2 are in process Vancomycin and cefepime  Pain control Wound care consulted Vascular team consult - plan for I&D Thurs 06/13  Hypokalemia Check serum magnesium level on admission Status post potassium chloride 40 mEq PO one-time dose per EDP IV potassium chloride 10 mill equivalent, 4 doses ordered on admission EKG ordered on admission, pending completion at the time of this dictation Repeat BMP in a.m.  Essential hypertension Hydralazine 5 mg IV every 6 hours as needed for SBP greater than 175, 4 days ordered  Diabetic neuropathy, type I diabetes mellitus (HCC) Insulin pump  Chronic pain syndrome PDMP reviewed  Tobacco use As needed nicotine patch ordered Counseling given, patient does not appear to be ready to quit     DVT prophylaxis: heparin  Pertinent IV fluids/nutrition: no continuous IV fluids  Central lines / invasive devices: none  Code Status: FULL CODE ACP documentation reviewed: 04/30/23 none on file   Current Admission Status: inpatient   TOC needs / Dispo plan: likely for home when stable, no TOC needs at this time  Barriers to discharge / significant pending items: surgery in 2 days              Subjective / Brief ROS:  Patient reports feeling better this morning  Denies CP/SOB.  Pain controlled.  Denies new  weakness.  Tolerating diet.  Reports no concerns w/ urination/defecation.   Family Communication: support person at bedside on rounds     Objective Findings:  Vitals:   04/30/23 0426 04/30/23 0828 04/30/23 1056 04/30/23 1445  BP: (!) 159/86 (!) 160/97  (!) 149/89  Pulse: 92 74  78  Resp:  18 18  15   Temp:   97.7 F (36.5 C) 98.1 F (36.7 C)  TempSrc:   Oral Oral  SpO2: 99% 100%  99%  Weight:      Height:        Intake/Output Summary (Last 24 hours) at 04/30/2023 1739 Last data filed at 04/30/2023 4098 Gross per 24 hour  Intake 1900 ml  Output --  Net 1900 ml   Filed Weights   04/29/23 1923  Weight: 93 kg    Examination:  Physical Exam Constitutional:      General: He is not in acute distress.    Appearance: He is not toxic-appearing.  Cardiovascular:     Rate and Rhythm: Normal rate and regular rhythm.  Pulmonary:     Effort: Pulmonary effort is normal.     Breath sounds: Normal breath sounds.  Abdominal:     Palpations: Abdomen is soft.  Musculoskeletal:     Comments: See photos   Neurological:     General: No focal deficit present.     Mental Status: He is alert. Mental status is at baseline.  Psychiatric:        Mood and Affect: Mood normal.        Behavior: Behavior normal.        Thought Content: Thought content normal.          Scheduled Medications:   atorvastatin  40 mg Oral Daily   benazepril  20 mg Oral Daily   heparin  5,000 Units Subcutaneous Q8H   insulin pump   Subcutaneous TID WC, HS, 0200   leptospermum manuka honey  1 Application Topical Daily   potassium chloride  40 mEq Oral Q4H   pregabalin  75 mg Oral BID    Continuous Infusions:  sodium chloride 10 mL/hr at 04/30/23 1523   piperacillin-tazobactam (ZOSYN)  IV 3.375 g (04/30/23 1525)   vancomycin      PRN Medications:  sodium chloride, acetaminophen **OR** acetaminophen, hydrALAZINE, melatonin, morphine injection, nicotine, ondansetron **OR** ondansetron (ZOFRAN) IV, oxyCODONE, senna-docusate  Antimicrobials from admission:  Anti-infectives (From admission, onward)    Start     Dose/Rate Route Frequency Ordered Stop   04/30/23 2200  vancomycin (VANCOREADY) IVPB 1500 mg/300 mL  Status:  Discontinued        1,500 mg 150 mL/hr over 120 Minutes Intravenous Every  24 hours 04/29/23 2309 04/30/23 1256   04/30/23 2200  vancomycin (VANCOREADY) IVPB 1750 mg/350 mL        1,750 mg 175 mL/hr over 120 Minutes Intravenous Every 24 hours 04/30/23 1256 05/06/23 2159   04/30/23 0400  piperacillin-tazobactam (ZOSYN) IVPB 3.375 g        3.375 g 12.5 mL/hr over 240 Minutes Intravenous Every 8 hours 04/29/23 2304 05/06/23 2159   04/29/23 2130  vancomycin (VANCOREADY) IVPB 2000 mg/400 mL        2,000 mg 200 mL/hr over 120 Minutes Intravenous  Once 04/29/23 2120 04/29/23 2354   04/29/23 2115  piperacillin-tazobactam (ZOSYN) IVPB 3.375 g        3.375 g 100 mL/hr over 30 Minutes Intravenous  Once 04/29/23 2108 04/29/23 2224  Data Reviewed:  I have personally reviewed the following...  CBC: Recent Labs  Lab 04/29/23 1923 04/30/23 0540  WBC 16.6* 12.6*  NEUTROABS 13.6*  --   HGB 12.3* 10.5*  HCT 36.8* 31.2*  MCV 83.6 84.1  PLT 224 176   Basic Metabolic Panel: Recent Labs  Lab 04/29/23 1923 04/30/23 0540  NA 133* 136  K 2.7* 2.8*  CL 102 109  CO2 21* 20*  GLUCOSE 124* 161*  BUN 18 16  CREATININE 1.83* 1.54*  CALCIUM 8.5* 7.6*  MG 2.2  --    GFR: Estimated Creatinine Clearance: 67.9 mL/min (A) (by C-G formula based on SCr of 1.54 mg/dL (H)). Liver Function Tests: Recent Labs  Lab 04/29/23 1923  AST 19  ALT 12  ALKPHOS 109  BILITOT 1.4*  PROT 7.3  ALBUMIN 3.3*   No results for input(s): "LIPASE", "AMYLASE" in the last 168 hours. No results for input(s): "AMMONIA" in the last 168 hours. Coagulation Profile: No results for input(s): "INR", "PROTIME" in the last 168 hours. Cardiac Enzymes: No results for input(s): "CKTOTAL", "CKMB", "CKMBINDEX", "TROPONINI" in the last 168 hours. BNP (last 3 results) No results for input(s): "PROBNP" in the last 8760 hours. HbA1C: Recent Labs    04/30/23 0540  HGBA1C 12.2*   CBG: Recent Labs  Lab 04/30/23 0231  GLUCAP 195*   Lipid Profile: No results for input(s): "CHOL",  "HDL", "LDLCALC", "TRIG", "CHOLHDL", "LDLDIRECT" in the last 72 hours. Thyroid Function Tests: No results for input(s): "TSH", "T4TOTAL", "FREET4", "T3FREE", "THYROIDAB" in the last 72 hours. Anemia Panel: No results for input(s): "VITAMINB12", "FOLATE", "FERRITIN", "TIBC", "IRON", "RETICCTPCT" in the last 72 hours. Most Recent Urinalysis On File:     Component Value Date/Time   COLORURINE STRAW (A) 04/30/2023 0540   APPEARANCEUR CLEAR (A) 04/30/2023 0540   LABSPEC 1.015 04/30/2023 0540   PHURINE 6.0 04/30/2023 0540   GLUCOSEU 150 (A) 04/30/2023 0540   HGBUR SMALL (A) 04/30/2023 0540   BILIRUBINUR NEGATIVE 04/30/2023 0540   KETONESUR NEGATIVE 04/30/2023 0540   PROTEINUR 100 (A) 04/30/2023 0540   UROBILINOGEN 0.2 07/31/2011 2217   NITRITE NEGATIVE 04/30/2023 0540   LEUKOCYTESUR NEGATIVE 04/30/2023 0540   Sepsis Labs: @LABRCNTIP (procalcitonin:4,lacticidven:4) Microbiology: Recent Results (from the past 240 hour(s))  Culture, blood (routine x 2)     Status: None (Preliminary result)   Collection Time: 04/29/23  9:39 PM   Specimen: BLOOD  Result Value Ref Range Status   Specimen Description BLOOD RIGHT ARM  Final   Special Requests   Final    BOTTLES DRAWN AEROBIC AND ANAEROBIC Blood Culture adequate volume   Culture   Final    NO GROWTH < 12 HOURS Performed at [facility], [address], [place], [place] [address]    Report Status PENDING  Incomplete  Culture, blood (routine x 2)     Status: None (Preliminary result)   Collection Time: 04/29/23  9:43 PM   Specimen: BLOOD  Result Value Ref Range Status   Specimen Description BLOOD LEFT ARM  Final   Special Requests   Final    BOTTLES DRAWN AEROBIC AND ANAEROBIC Blood Culture adequate volume   Culture   Final    NO GROWTH < 12 HOURS Performed at [facility], [address], [place], [place] [address]    Report Status PENDING  Incomplete      Radiology Studies last 3 days: MR KNEE LEFT W WO  CONTRAST  Result Date: 04/30/2023 CLINICAL DATA:  Left below-the-knee  amputation. Leg infection. Fever and chills. Redness to knee wound. EXAM: MR OF THE LEFT LOWER EXTREMITY WITHOUT AND WITH CONTRAST; MRI OF THE LEFT KNEE WITHOUT AND WITH CONTRAST TECHNIQUE: Multiplanar, multisequence MR imaging of the left femur was performed both before and after administration of intravenous contrast. Multiplanar, multisequence MR imaging of the left knee was performed both before and after administration of intravenous contrast. CONTRAST:  9mL GADAVIST GADOBUTROL 1 MMOL/ML IV SOLN COMPARISON:  left knee radiographs 04/29/2023, 11/25/2022, 07/12/2022 FINDINGS: Left femur: Bones/Joint/Cartilage Normal marrow signal within the left femur. The cortices are intact. No marrow edema. Enlarged of the images there is moderate bilateral femoroacetabular cartilage thinning and femoral head-neck junction degenerative osteophytes. There is a 13 mm decreased T1 decreased T2 signal sclerotic focus within the distal medial right femoral neck junction with the intertrochanteric region, compatible with a benign bone island. Overlying cortex is intact (coronal series 7, image 18). Muscles and Tendons Normal signal and size of the left thigh musculature. Soft tissues There is moderate subcutaneous fat edema and swelling within the distal 50% of the medial left thigh. At the level of the medial femoral condyle there is an oval predominantly decreased T1 increased T2 signal walled-off collection with internal septations and peripheral wall enhancement, measuring up to approximately 1.8 x 2.3 x 2.9 cm (transverse by AP by craniocaudal (axial series 17, image 84 and coronal series 11 image 16). This is suspicious for an abscess, superficial to the medial collateral ligament. There is mild coalescing fluid within the subcutaneous fat around this walled-off abscess. There are at least 5 enlarged left inguinal lymph nodes, measuring up to 1.9 x 2.6 cm  in transverse dimensions (axial series 17, image 25). These are presumably reactive. -- Left knee: MENISCI Medial meniscus:  Intact. Lateral meniscus:  Intact. LIGAMENTS Cruciates: The ACL and PCL are intact. Collaterals: The medial collateral ligament is intact. The fibular collateral ligament, biceps femoris tendon, iliotibial band, and popliteus tendon are intact. CARTILAGE Patellofemoral:  Intact. Medial:  Intact. Lateral:  Intact. Joint: Nojoint effusion. Normal Hoffa's fat pad. No plical thickening. Extensor Mechanism: Mild chronic enthesopathic change at the quadriceps insertion on the patella. The quadriceps tendon insertion is intact. Moderate chronic enthesopathic change at the patellar tendon origin. Moderate intermediate T2 signal proximal patellar tendinosis. Moderate chronic enthesopathic change at the patellar tendon insertion on the tibial tubercle with moderate intermediate T2 signal and thickening insertional tendinosis. Bones: Postsurgical changes are seen of amputation at the level of the proximal tibial diaphysis. The cortices are intact. No acute fracture. Soft tissues: There is moderate edema and swelling of the medial knee subcutaneous fat, contiguous with the distal thigh described above. Rim enhancing abscess within the subcutaneous fat medial to the medial femoral condyle as described above. There is metallic susceptibility artifact within the distal left lower extremity amputation soft tissues consistent with postsurgical change. There is mild-to-moderate edema and swelling of the subcutaneous fat at the distal amputation stump, tracking along the distal lateral subcutaneous fat greater than distal medial subcutaneous fat (coronal series 13 images 4 through 19 and sagittal series 16 images 12 through 37). This subcutaneous fat edema extends to the level of the distal tibial bone amputation site, however no definite edema or enhancement is seen within the bone marrow to indicate acute  osteomyelitis. IMPRESSION: 1. There is moderate subcutaneous fat edema and swelling within the distal 50% of the medial left thigh and within the medial left knee. Within this subcutaneous fat swelling there is a  walled-off abscess medial to the left medial femoral condyle measuring up to 2.9 cm in greatest dimension. 2. There is mild-to-moderate subcutaneous fat edema and swelling at the distal left tibial amputation stump, tracking along the distal lateral greater than distal medial subcutaneous fat. This subcutaneous fat edema extends to the level of the distal tibial bone amputation site, however no definite edema or enhancement is seen within the bone marrow to indicate acute osteomyelitis at this time. 3. There are at least 5 enlarged left inguinal lymph nodes, measuring up to 1.9 x 2.6 cm in transverse dimensions. These are presumably reactive. Electronically Signed   By: Neita Garnet M.D.   On: 04/30/2023 09:49   MR FEMUR LEFT W WO CONTRAST  Result Date: 04/30/2023 CLINICAL DATA:  Left below-the-knee amputation. Leg infection. Fever and chills. Redness to knee wound. EXAM: MR OF THE LEFT LOWER EXTREMITY WITHOUT AND WITH CONTRAST; MRI OF THE LEFT KNEE WITHOUT AND WITH CONTRAST TECHNIQUE: Multiplanar, multisequence MR imaging of the left femur was performed both before and after administration of intravenous contrast. Multiplanar, multisequence MR imaging of the left knee was performed both before and after administration of intravenous contrast. CONTRAST:  9mL GADAVIST GADOBUTROL 1 MMOL/ML IV SOLN COMPARISON:  left knee radiographs 04/29/2023, 11/25/2022, 07/12/2022 FINDINGS: Left femur: Bones/Joint/Cartilage Normal marrow signal within the left femur. The cortices are intact. No marrow edema. Enlarged of the images there is moderate bilateral femoroacetabular cartilage thinning and femoral head-neck junction degenerative osteophytes. There is a 13 mm decreased T1 decreased T2 signal sclerotic focus  within the distal medial right femoral neck junction with the intertrochanteric region, compatible with a benign bone island. Overlying cortex is intact (coronal series 7, image 18). Muscles and Tendons Normal signal and size of the left thigh musculature. Soft tissues There is moderate subcutaneous fat edema and swelling within the distal 50% of the medial left thigh. At the level of the medial femoral condyle there is an oval predominantly decreased T1 increased T2 signal walled-off collection with internal septations and peripheral wall enhancement, measuring up to approximately 1.8 x 2.3 x 2.9 cm (transverse by AP by craniocaudal (axial series 17, image 84 and coronal series 11 image 16). This is suspicious for an abscess, superficial to the medial collateral ligament. There is mild coalescing fluid within the subcutaneous fat around this walled-off abscess. There are at least 5 enlarged left inguinal lymph nodes, measuring up to 1.9 x 2.6 cm in transverse dimensions (axial series 17, image 25). These are presumably reactive. -- Left knee: MENISCI Medial meniscus:  Intact. Lateral meniscus:  Intact. LIGAMENTS Cruciates: The ACL and PCL are intact. Collaterals: The medial collateral ligament is intact. The fibular collateral ligament, biceps femoris tendon, iliotibial band, and popliteus tendon are intact. CARTILAGE Patellofemoral:  Intact. Medial:  Intact. Lateral:  Intact. Joint: Nojoint effusion. Normal Hoffa's fat pad. No plical thickening. Extensor Mechanism: Mild chronic enthesopathic change at the quadriceps insertion on the patella. The quadriceps tendon insertion is intact. Moderate chronic enthesopathic change at the patellar tendon origin. Moderate intermediate T2 signal proximal patellar tendinosis. Moderate chronic enthesopathic change at the patellar tendon insertion on the tibial tubercle with moderate intermediate T2 signal and thickening insertional tendinosis. Bones: Postsurgical changes are  seen of amputation at the level of the proximal tibial diaphysis. The cortices are intact. No acute fracture. Soft tissues: There is moderate edema and swelling of the medial knee subcutaneous fat, contiguous with the distal thigh described above. Rim enhancing abscess within the subcutaneous fat  medial to the medial femoral condyle as described above. There is metallic susceptibility artifact within the distal left lower extremity amputation soft tissues consistent with postsurgical change. There is mild-to-moderate edema and swelling of the subcutaneous fat at the distal amputation stump, tracking along the distal lateral subcutaneous fat greater than distal medial subcutaneous fat (coronal series 13 images 4 through 19 and sagittal series 16 images 12 through 37). This subcutaneous fat edema extends to the level of the distal tibial bone amputation site, however no definite edema or enhancement is seen within the bone marrow to indicate acute osteomyelitis. IMPRESSION: 1. There is moderate subcutaneous fat edema and swelling within the distal 50% of the medial left thigh and within the medial left knee. Within this subcutaneous fat swelling there is a walled-off abscess medial to the left medial femoral condyle measuring up to 2.9 cm in greatest dimension. 2. There is mild-to-moderate subcutaneous fat edema and swelling at the distal left tibial amputation stump, tracking along the distal lateral greater than distal medial subcutaneous fat. This subcutaneous fat edema extends to the level of the distal tibial bone amputation site, however no definite edema or enhancement is seen within the bone marrow to indicate acute osteomyelitis at this time. 3. There are at least 5 enlarged left inguinal lymph nodes, measuring up to 1.9 x 2.6 cm in transverse dimensions. These are presumably reactive. Electronically Signed   By: Neita Garnet M.D.   On: 04/30/2023 09:49   DG Knee Complete 4 Views Left  Result Date:  04/29/2023 CLINICAL DATA:  Pressure wound, cellulitis. EXAM: LEFT KNEE - COMPLETE 4+ VIEW COMPARISON:  Knee radiographs 11/25/2022. FINDINGS: The patient is status post below-knee amputation. There is soft tissue irregularity overlying the distal aspect of the stump which may correspond to the reported wound, with surrounding swelling. There is no osseous erosion or destruction in the underlying bone. Knee alignment is maintained. The joint spaces are preserved. IMPRESSION: Status post below-knee amputation with soft tissue irregularity overlying the distal aspect of the stump which may correspond to the reported wound, with surrounding swelling. No erosion or destruction of the underlying bone. Electronically Signed   By: Lesia Hausen M.D.   On: 04/29/2023 21:37             LOS: 1 day        Sunnie Nielsen, DO [facility] 04/30/2023, 5:39 PM    Dictation software may have been used to generate the above note. Typos may occur and escape review in typed/dictated notes. Please contact Dr Lyn Hollingshead directly for clarity if needed.  Staff may message me via secure chat in Epic  but this may not receive an immediate response,  please page me for urgent matters!  If 7PM-7AM, please contact night coverage [email or web address]

## 2023-04-30 NOTE — Progress Notes (Signed)
Pharmacy Antibiotic Note  Richard Mendoza is a 47 y.o. male admitted on 04/29/2023 with cellulitis.  Pharmacy has been consulted for Zosyn & Vancomycin dosing for 7 days.  Scr: 1.83>>1.54  Plan: Zosyn 3.375g IV q8h (4 hour infusion).  Will adjust dose to Vancomycin 1750 mg IV Q 24 hrs. Goal AUC 400-550. Expected AUC: 473 SCr used: 1.54  Pharmacy will continue to follow and will adjust abx dosing  based on renal function.  Temp (24hrs), Avg:98.2 F (36.8 C), Min:97.7 F (36.5 C), Max:98.4 F (36.9 C)   Recent Labs  Lab 04/29/23 1923 04/30/23 0540  WBC 16.6* 12.6*  CREATININE 1.83* 1.54*  LATICACIDVEN 1.3  --      Estimated Creatinine Clearance: 67.9 mL/min (A) (by C-G formula based on SCr of 1.54 mg/dL (H)).    No Known Allergies  Antimicrobials this admission: 6/10 Zosyn >> x 7 days 6/10 Vancomycin >> x 7 days  Microbiology results: 6/10 BCx: NG Pending   Thank you for allowing pharmacy to be a part of this patient's care.  Gardner Candle, PharmD, BCPS Clinical Pharmacist 04/30/2023 12:52 PM

## 2023-04-30 NOTE — Consult Note (Addendum)
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 to alter prosthesis and prevent further trauma  Reason for Consult: L stump/L knee wounds  Wound type: full thickness, traumatic from rubbing of prosthesis per patient  Pressure Injury POA: NA  Measurement: L medial knee total area 5 cm x 5 cm erythema with a 3 cm x 3 cm area of yellow brown slough; at posterior aspect of stump is a full thickness area measuring 1 cm x 1 cm x 0.3 cm that patient states has been there for a long time 100% dry red tissue; L lateral knee with 0.8 cm x 0.8 cm area of 100% brown red slough   Drainage (amount, consistency, odor) minimal serosanguinous from medial aspect, none noted to posterior or lateral wounds  Periwound: erythema and warmth surrounding medial wound, lateral wound with erythema and warmth, ? fluctuance to this area  Dressing procedure/placement/frequency: Clean stump wounds with NS, apply Medihoney to wound beds for debridement of necrotic tissue daily and cover with dry gauze.  Secure dressing with Kerlix roll gauze and tape.    Patient states he has had to have surgery for debridement of similar wounds in the past. Is awaiting results of MRI and consultation by ortho/general surgery for possible surgical intervention.  Patient is receiving Vancomycin and Zosyn for cellulitis at this time.    POC discussed with patient and bedside nurse.  WOC team will not follow at this time. Re-consult if further needs arise.   Thank you,    Priscella Mann MSN, RN-BC, [employer] [phone]

## 2023-05-01 DIAGNOSIS — L03116 Cellulitis of left lower limb: Secondary | ICD-10-CM | POA: Diagnosis not present

## 2023-05-01 LAB — BASIC METABOLIC PANEL
Anion gap: 8 (ref 5–15)
BUN: 17 mg/dL (ref 6–20)
CO2: 19 mmol/L — ABNORMAL LOW (ref 22–32)
Calcium: 7.3 mg/dL — ABNORMAL LOW (ref 8.9–10.3)
Chloride: 113 mmol/L — ABNORMAL HIGH (ref 98–111)
Creatinine, Ser: 1.76 mg/dL — ABNORMAL HIGH (ref 0.61–1.24)
GFR, Estimated: 47 mL/min — ABNORMAL LOW (ref >60)
Glucose, Bld: 94 mg/dL (ref 70–99)
Potassium: 3.5 mmol/L (ref 3.5–5.1)
Sodium: 140 mmol/L (ref 135–145)

## 2023-05-01 LAB — CULTURE, BLOOD (ROUTINE X 2)

## 2023-05-01 MED ORDER — HYDROCODONE-ACETAMINOPHEN 7.5-325 MG PO TABS
1.0000 | ORAL_TABLET | Freq: Four times a day (QID) | ORAL | Status: DC | PRN
  Filled 2023-05-01: qty 1

## 2023-05-01 MED ORDER — HYDROCODONE-ACETAMINOPHEN 10-325 MG PO TABS
1.0000 | ORAL_TABLET | Freq: Four times a day (QID) | ORAL | Status: DC | PRN
  Administered 2023-05-02 – 2023-05-03 (×2): 1 via ORAL
  Filled 2023-05-01 (×3): qty 1

## 2023-05-01 MED ORDER — HYDROMORPHONE HCL 1 MG/ML IJ SOLN
0.5000 mg | INTRAMUSCULAR | Status: DC | PRN
  Administered 2023-05-01 – 2023-05-03 (×8): 0.5 mg via INTRAVENOUS
  Filled 2023-05-01 (×9): qty 1

## 2023-05-01 MED ORDER — VANCOMYCIN HCL 1500 MG/300ML IV SOLN
1500.0000 mg | INTRAVENOUS | Status: DC
  Administered 2023-05-01 – 2023-05-02 (×2): 1500 mg via INTRAVENOUS
  Filled 2023-05-01 (×3): qty 300

## 2023-05-01 NOTE — Progress Notes (Signed)
Rounding on pt at this time. Pt soundly(snoring) asleep without distress. Pt with unlabored breathing. Chest rise and fall equal bilaterally. Continued plan of care.

## 2023-05-01 NOTE — Progress Notes (Signed)
PROGRESS NOTE    Richard Mendoza   [id number] DOB: January 13, 1976  DOA: 04/29/2023 Date of Service: 05/01/23 PCP: Barbette Reichmann, MD     Brief Narrative / Hospital Course:  Richard Mendoza is a 47 year old male with history of CKD 3A, history of necrotizing fasciitis status post L BKA, insulin-dependent diabetes mellitus type 1, hypertension, depression, anxiety, neuropathy, chronic pain syndrome, who presents to the emergency department for chief concerns of infection of the left lower extremity. 06/10: VSS, hypokalemia, AKI, leukocytosis, Admitted for cellulitis LLE.  06/11: MRI shows abscess, vascular surgery consult planning I&D 06/13, WBC trend down 06/12: stable, continue abx and pain control. BCx NGx2d.     Consultants:  Vascular surgery   Procedures: [facility] I&D abscess LLE tomorrow 05/02/2023 w/ vascular surgery]     ASSESSMENT & PLAN:   Principal Problem:   Left leg cellulitis Active Problems:   Essential hypertension   Diabetic neuropathy, type I diabetes mellitus (HCC)   Chronic pain syndrome   Diabetic peripheral neuropathy associated with type 2 diabetes mellitus (HCC)   Hyperlipidemia associated with type 2 diabetes mellitus (HCC)   Tobacco abuse disorder   History of depression   Necrotizing fasciitis of ankle and foot (HCC)   S/P BKA (below knee amputation), left (HCC)   Constipation   Major depressive disorder, recurrent, in remission (HCC)   PAD (peripheral artery disease) (HCC)   Hypokalemia   Tobacco use   Left leg cellulitis LLE abscess  Blood cultures x 2 NGx2d Continue Vancomycin and cefepime  Pain control w/ hydrocodone-APAP 7.5-325 mg q6h prn + dilaudid for breakthrough pain (pt reports itching w/ oxycodone and morphine) Wound care following  Vascular team following - plan for I&D Thurs 06/13  Hypokalemia - resolved  serum magnesium level on admission was normal monitor BMP   Essential hypertension Hydralazine 5 mg IV every  6 hours as needed for SBP greater than 175, 4 days ordered  Diabetic neuropathy, type I diabetes mellitus (HCC) Insulin pump per home regimen  Chronic pain syndrome PDMP reviewed - he is on hydrocodone-APAP 5-325 at home Pain control here as above   Tobacco use As needed nicotine patch ordered Counseling given, patient does not appear to be ready to quit     DVT prophylaxis: heparin  Pertinent IV fluids/nutrition: no continuous IV fluids  Central lines / invasive devices: none  Code Status: FULL CODE ACP documentation reviewed: 04/30/23 none on file   Current Admission Status: inpatient   TOC needs / Dispo plan: likely for home when stable, no TOC needs at this time  Barriers to discharge / significant pending items: surgery tomorrow and PT/OT following then. If blood cultures remain negative and pending surgery recs may be able to discharge 06/14              Subjective / Brief ROS:  Patient reports feeling ok today, some itching yesterday w/ oxycodone but this has resolved Glc low this morning  Denies CP/SOB.  Pain not well controlled at this time but he notes he's due for Dilaudid  Denies new weakness.  Tolerating diet.  Reports no concerns w/ urination/defecation.   Family Communication: support person at bedside on rounds     Objective Findings:  Vitals:   04/30/23 1056 04/30/23 1445 04/30/23 2143 05/01/23 0821  BP:  (!) 149/89 (!) 121/105 (!) 145/88  Pulse:  78 72 75  Resp:  15 18 17   Temp: 97.7 F (36.5 C) 98.1 F (36.7 C) 98.8 F (37.1  C) 97.9 F (36.6 C)  TempSrc: Oral Oral  Oral  SpO2:  99% 100% 100%  Weight:      Height:        Intake/Output Summary (Last 24 hours) at 05/01/2023 1018 Last data filed at 05/01/2023 0500 Gross per 24 hour  Intake 662.63 ml  Output 550 ml  Net 112.63 ml   Filed Weights   04/29/23 1923  Weight: 93 kg    Examination:  Physical Exam Constitutional:      General: He is not in acute distress.     Appearance: He is not toxic-appearing.  Cardiovascular:     Rate and Rhythm: Normal rate and regular rhythm.  Pulmonary:     Effort: Pulmonary effort is normal.     Breath sounds: Normal breath sounds.  Abdominal:     Palpations: Abdomen is soft.  Musculoskeletal:     Comments: See photos   Neurological:     General: No focal deficit present.     Mental Status: He is alert. Mental status is at baseline.  Psychiatric:        Mood and Affect: Mood normal.        Behavior: Behavior normal.        Thought Content: Thought content normal.          Scheduled Medications:   atorvastatin  40 mg Oral Daily   benazepril  20 mg Oral Daily   heparin  5,000 Units Subcutaneous Q8H   insulin pump   Subcutaneous TID WC, HS, 0200   leptospermum manuka honey  1 Application Topical Daily   pregabalin  75 mg Oral BID    Continuous Infusions:  sodium chloride 10 mL/hr at 04/30/23 2346   piperacillin-tazobactam (ZOSYN)  IV 3.375 g (05/01/23 0603)   vancomycin      PRN Medications:  sodium chloride, acetaminophen **OR** acetaminophen, hydrALAZINE, HYDROcodone-acetaminophen, HYDROmorphone (DILAUDID) injection, melatonin, nicotine, ondansetron **OR** ondansetron (ZOFRAN) IV, senna-docusate  Antimicrobials from admission:  Anti-infectives (From admission, onward)    Start     Dose/Rate Route Frequency Ordered Stop   05/01/23 2200  vancomycin (VANCOREADY) IVPB 1500 mg/300 mL        1,500 mg 150 mL/hr over 120 Minutes Intravenous Every 24 hours 05/01/23 0935 05/06/23 2159   04/30/23 2200  vancomycin (VANCOREADY) IVPB 1500 mg/300 mL  Status:  Discontinued        1,500 mg 150 mL/hr over 120 Minutes Intravenous Every 24 hours 04/29/23 2309 04/30/23 1256   04/30/23 2200  vancomycin (VANCOREADY) IVPB 1750 mg/350 mL  Status:  Discontinued        1,750 mg 175 mL/hr over 120 Minutes Intravenous Every 24 hours 04/30/23 1256 05/01/23 0935   04/30/23 0400  piperacillin-tazobactam (ZOSYN) IVPB 3.375  g        3.375 g 12.5 mL/hr over 240 Minutes Intravenous Every 8 hours 04/29/23 2304 05/06/23 2159   04/29/23 2130  vancomycin (VANCOREADY) IVPB 2000 mg/400 mL        2,000 mg 200 mL/hr over 120 Minutes Intravenous  Once 04/29/23 2120 04/29/23 2354   04/29/23 2115  piperacillin-tazobactam (ZOSYN) IVPB 3.375 g        3.375 g 100 mL/hr over 30 Minutes Intravenous  Once 04/29/23 2108 04/29/23 2224           Data Reviewed:  I have personally reviewed the following...  CBC: Recent Labs  Lab 04/29/23 1923 04/30/23 0540  WBC 16.6* 12.6*  NEUTROABS 13.6*  --   HGB  12.3* 10.5*  HCT 36.8* 31.2*  MCV 83.6 84.1  PLT 224 176   Basic Metabolic Panel: Recent Labs  Lab 04/29/23 1923 04/30/23 0540 05/01/23 0813  NA 133* 136 140  K 2.7* 2.8* 3.5  CL 102 109 113*  CO2 21* 20* 19*  GLUCOSE 124* 161* 94  BUN 18 16 17   CREATININE 1.83* 1.54* 1.76*  CALCIUM 8.5* 7.6* 7.3*  MG 2.2  --   --    GFR: Estimated Creatinine Clearance: 59.4 mL/min (A) (by C-G formula based on SCr of 1.76 mg/dL (H)). Liver Function Tests: Recent Labs  Lab 04/29/23 1923  AST 19  ALT 12  ALKPHOS 109  BILITOT 1.4*  PROT 7.3  ALBUMIN 3.3*   No results for input(s): "LIPASE", "AMYLASE" in the last 168 hours. No results for input(s): "AMMONIA" in the last 168 hours. Coagulation Profile: No results for input(s): "INR", "PROTIME" in the last 168 hours. Cardiac Enzymes: No results for input(s): "CKTOTAL", "CKMB", "CKMBINDEX", "TROPONINI" in the last 168 hours. BNP (last 3 results) No results for input(s): "PROBNP" in the last 8760 hours. HbA1C: Recent Labs    04/30/23 0540  HGBA1C 12.2*   CBG: Recent Labs  Lab 04/30/23 0231  GLUCAP 195*   Lipid Profile: No results for input(s): "CHOL", "HDL", "LDLCALC", "TRIG", "CHOLHDL", "LDLDIRECT" in the last 72 hours. Thyroid Function Tests: No results for input(s): "TSH", "T4TOTAL", "FREET4", "T3FREE", "THYROIDAB" in the last 72 hours. Anemia  Panel: No results for input(s): "VITAMINB12", "FOLATE", "FERRITIN", "TIBC", "IRON", "RETICCTPCT" in the last 72 hours. Most Recent Urinalysis On File:     Component Value Date/Time   COLORURINE STRAW (A) 04/30/2023 0540   APPEARANCEUR CLEAR (A) 04/30/2023 0540   LABSPEC 1.015 04/30/2023 0540   PHURINE 6.0 04/30/2023 0540   GLUCOSEU 150 (A) 04/30/2023 0540   HGBUR SMALL (A) 04/30/2023 0540   BILIRUBINUR NEGATIVE 04/30/2023 0540   KETONESUR NEGATIVE 04/30/2023 0540   PROTEINUR 100 (A) 04/30/2023 0540   UROBILINOGEN 0.2 07/31/2011 2217   NITRITE NEGATIVE 04/30/2023 0540   LEUKOCYTESUR NEGATIVE 04/30/2023 0540   Sepsis Labs: @LABRCNTIP (procalcitonin:4,lacticidven:4) Microbiology: Recent Results (from the past 240 hour(s))  Culture, blood (routine x 2)     Status: None (Preliminary result)   Collection Time: 04/29/23  9:39 PM   Specimen: BLOOD  Result Value Ref Range Status   Specimen Description BLOOD RIGHT ARM  Final   Special Requests   Final    BOTTLES DRAWN AEROBIC AND ANAEROBIC Blood Culture adequate volume   Culture   Final    NO GROWTH 2 DAYS Performed at [facility], [address] [place], [place] [address]    Report Status PENDING  Incomplete  Culture, blood (routine x 2)     Status: None (Preliminary result)   Collection Time: 04/29/23  9:43 PM   Specimen: BLOOD  Result Value Ref Range Status   Specimen Description BLOOD LEFT ARM  Final   Special Requests   Final    BOTTLES DRAWN AEROBIC AND ANAEROBIC Blood Culture adequate volume   Culture   Final    NO GROWTH 2 DAYS Performed at [facility], [address] [place], [place] [address]    Report Status PENDING  Incomplete      Radiology Studies last 3 days: MR KNEE LEFT W WO CONTRAST  Result Date: 04/30/2023 CLINICAL DATA:  Left below-the-knee amputation. Leg infection. Fever and chills. Redness to knee wound. EXAM: MR OF THE LEFT LOWER EXTREMITY WITHOUT AND WITH CONTRAST;  MRI OF THE  LEFT KNEE WITHOUT AND WITH CONTRAST TECHNIQUE: Multiplanar, multisequence MR imaging of the left femur was performed both before and after administration of intravenous contrast. Multiplanar, multisequence MR imaging of the left knee was performed both before and after administration of intravenous contrast. CONTRAST:  9mL GADAVIST GADOBUTROL 1 MMOL/ML IV SOLN COMPARISON:  left knee radiographs 04/29/2023, 11/25/2022, 07/12/2022 FINDINGS: Left femur: Bones/Joint/Cartilage Normal marrow signal within the left femur. The cortices are intact. No marrow edema. Enlarged of the images there is moderate bilateral femoroacetabular cartilage thinning and femoral head-neck junction degenerative osteophytes. There is a 13 mm decreased T1 decreased T2 signal sclerotic focus within the distal medial right femoral neck junction with the intertrochanteric region, compatible with a benign bone island. Overlying cortex is intact (coronal series 7, image 18). Muscles and Tendons Normal signal and size of the left thigh musculature. Soft tissues There is moderate subcutaneous fat edema and swelling within the distal 50% of the medial left thigh. At the level of the medial femoral condyle there is an oval predominantly decreased T1 increased T2 signal walled-off collection with internal septations and peripheral wall enhancement, measuring up to approximately 1.8 x 2.3 x 2.9 cm (transverse by AP by craniocaudal (axial series 17, image 84 and coronal series 11 image 16). This is suspicious for an abscess, superficial to the medial collateral ligament. There is mild coalescing fluid within the subcutaneous fat around this walled-off abscess. There are at least 5 enlarged left inguinal lymph nodes, measuring up to 1.9 x 2.6 cm in transverse dimensions (axial series 17, image 25). These are presumably reactive. -- Left knee: MENISCI Medial meniscus:  Intact. Lateral meniscus:  Intact. LIGAMENTS Cruciates: The ACL and PCL are intact.  Collaterals: The medial collateral ligament is intact. The fibular collateral ligament, biceps femoris tendon, iliotibial band, and popliteus tendon are intact. CARTILAGE Patellofemoral:  Intact. Medial:  Intact. Lateral:  Intact. Joint: Nojoint effusion. Normal Hoffa's fat pad. No plical thickening. Extensor Mechanism: Mild chronic enthesopathic change at the quadriceps insertion on the patella. The quadriceps tendon insertion is intact. Moderate chronic enthesopathic change at the patellar tendon origin. Moderate intermediate T2 signal proximal patellar tendinosis. Moderate chronic enthesopathic change at the patellar tendon insertion on the tibial tubercle with moderate intermediate T2 signal and thickening insertional tendinosis. Bones: Postsurgical changes are seen of amputation at the level of the proximal tibial diaphysis. The cortices are intact. No acute fracture. Soft tissues: There is moderate edema and swelling of the medial knee subcutaneous fat, contiguous with the distal thigh described above. Rim enhancing abscess within the subcutaneous fat medial to the medial femoral condyle as described above. There is metallic susceptibility artifact within the distal left lower extremity amputation soft tissues consistent with postsurgical change. There is mild-to-moderate edema and swelling of the subcutaneous fat at the distal amputation stump, tracking along the distal lateral subcutaneous fat greater than distal medial subcutaneous fat (coronal series 13 images 4 through 19 and sagittal series 16 images 12 through 37). This subcutaneous fat edema extends to the level of the distal tibial bone amputation site, however no definite edema or enhancement is seen within the bone marrow to indicate acute osteomyelitis. IMPRESSION: 1. There is moderate subcutaneous fat edema and swelling within the distal 50% of the medial left thigh and within the medial left knee. Within this subcutaneous fat swelling there is a  walled-off abscess medial to the left medial femoral condyle measuring up to 2.9 cm in greatest dimension. 2. There is  mild-to-moderate subcutaneous fat edema and swelling at the distal left tibial amputation stump, tracking along the distal lateral greater than distal medial subcutaneous fat. This subcutaneous fat edema extends to the level of the distal tibial bone amputation site, however no definite edema or enhancement is seen within the bone marrow to indicate acute osteomyelitis at this time. 3. There are at least 5 enlarged left inguinal lymph nodes, measuring up to 1.9 x 2.6 cm in transverse dimensions. These are presumably reactive. Electronically Signed   By: Neita Garnet M.D.   On: 04/30/2023 09:49   MR FEMUR LEFT W WO CONTRAST  Result Date: 04/30/2023 CLINICAL DATA:  Left below-the-knee amputation. Leg infection. Fever and chills. Redness to knee wound. EXAM: MR OF THE LEFT LOWER EXTREMITY WITHOUT AND WITH CONTRAST; MRI OF THE LEFT KNEE WITHOUT AND WITH CONTRAST TECHNIQUE: Multiplanar, multisequence MR imaging of the left femur was performed both before and after administration of intravenous contrast. Multiplanar, multisequence MR imaging of the left knee was performed both before and after administration of intravenous contrast. CONTRAST:  9mL GADAVIST GADOBUTROL 1 MMOL/ML IV SOLN COMPARISON:  left knee radiographs 04/29/2023, 11/25/2022, 07/12/2022 FINDINGS: Left femur: Bones/Joint/Cartilage Normal marrow signal within the left femur. The cortices are intact. No marrow edema. Enlarged of the images there is moderate bilateral femoroacetabular cartilage thinning and femoral head-neck junction degenerative osteophytes. There is a 13 mm decreased T1 decreased T2 signal sclerotic focus within the distal medial right femoral neck junction with the intertrochanteric region, compatible with a benign bone island. Overlying cortex is intact (coronal series 7, image 18). Muscles and Tendons Normal signal  and size of the left thigh musculature. Soft tissues There is moderate subcutaneous fat edema and swelling within the distal 50% of the medial left thigh. At the level of the medial femoral condyle there is an oval predominantly decreased T1 increased T2 signal walled-off collection with internal septations and peripheral wall enhancement, measuring up to approximately 1.8 x 2.3 x 2.9 cm (transverse by AP by craniocaudal (axial series 17, image 84 and coronal series 11 image 16). This is suspicious for an abscess, superficial to the medial collateral ligament. There is mild coalescing fluid within the subcutaneous fat around this walled-off abscess. There are at least 5 enlarged left inguinal lymph nodes, measuring up to 1.9 x 2.6 cm in transverse dimensions (axial series 17, image 25). These are presumably reactive. -- Left knee: MENISCI Medial meniscus:  Intact. Lateral meniscus:  Intact. LIGAMENTS Cruciates: The ACL and PCL are intact. Collaterals: The medial collateral ligament is intact. The fibular collateral ligament, biceps femoris tendon, iliotibial band, and popliteus tendon are intact. CARTILAGE Patellofemoral:  Intact. Medial:  Intact. Lateral:  Intact. Joint: Nojoint effusion. Normal Hoffa's fat pad. No plical thickening. Extensor Mechanism: Mild chronic enthesopathic change at the quadriceps insertion on the patella. The quadriceps tendon insertion is intact. Moderate chronic enthesopathic change at the patellar tendon origin. Moderate intermediate T2 signal proximal patellar tendinosis. Moderate chronic enthesopathic change at the patellar tendon insertion on the tibial tubercle with moderate intermediate T2 signal and thickening insertional tendinosis. Bones: Postsurgical changes are seen of amputation at the level of the proximal tibial diaphysis. The cortices are intact. No acute fracture. Soft tissues: There is moderate edema and swelling of the medial knee subcutaneous fat, contiguous with the  distal thigh described above. Rim enhancing abscess within the subcutaneous fat medial to the medial femoral condyle as described above. There is metallic susceptibility artifact within the distal left lower extremity  amputation soft tissues consistent with postsurgical change. There is mild-to-moderate edema and swelling of the subcutaneous fat at the distal amputation stump, tracking along the distal lateral subcutaneous fat greater than distal medial subcutaneous fat (coronal series 13 images 4 through 19 and sagittal series 16 images 12 through 37). This subcutaneous fat edema extends to the level of the distal tibial bone amputation site, however no definite edema or enhancement is seen within the bone marrow to indicate acute osteomyelitis. IMPRESSION: 1. There is moderate subcutaneous fat edema and swelling within the distal 50% of the medial left thigh and within the medial left knee. Within this subcutaneous fat swelling there is a walled-off abscess medial to the left medial femoral condyle measuring up to 2.9 cm in greatest dimension. 2. There is mild-to-moderate subcutaneous fat edema and swelling at the distal left tibial amputation stump, tracking along the distal lateral greater than distal medial subcutaneous fat. This subcutaneous fat edema extends to the level of the distal tibial bone amputation site, however no definite edema or enhancement is seen within the bone marrow to indicate acute osteomyelitis at this time. 3. There are at least 5 enlarged left inguinal lymph nodes, measuring up to 1.9 x 2.6 cm in transverse dimensions. These are presumably reactive. Electronically Signed   By: Neita Garnet M.D.   On: 04/30/2023 09:49   DG Knee Complete 4 Views Left  Result Date: 04/29/2023 CLINICAL DATA:  Pressure wound, cellulitis. EXAM: LEFT KNEE - COMPLETE 4+ VIEW COMPARISON:  Knee radiographs 11/25/2022. FINDINGS: The patient is status post below-knee amputation. There is soft tissue  irregularity overlying the distal aspect of the stump which may correspond to the reported wound, with surrounding swelling. There is no osseous erosion or destruction in the underlying bone. Knee alignment is maintained. The joint spaces are preserved. IMPRESSION: Status post below-knee amputation with soft tissue irregularity overlying the distal aspect of the stump which may correspond to the reported wound, with surrounding swelling. No erosion or destruction of the underlying bone. Electronically Signed   By: Lesia Hausen M.D.   On: 04/29/2023 21:37             LOS: 2 days        Sunnie Nielsen, DO [facility] 05/01/2023, 10:18 AM    Dictation software may have been used to generate the above note. Typos may occur and escape review in typed/dictated notes. Please contact Dr Lyn Hollingshead directly for clarity if needed.  Staff may message me via secure chat in Epic  but this may not receive an immediate response,  please page me for urgent matters!  If 7PM-7AM, please contact night coverage [email or web address]

## 2023-05-01 NOTE — Progress Notes (Signed)
Pharmacy Antibiotic Note  Richard Mendoza is a 47 y.o. male admitted on 04/29/2023 with cellulitis.  Pharmacy has been consulted for Zosyn & Vancomycin dosing for 7 days.  Scr: 1.83>>1.54>>1.76  Plan: Zosyn 3.375g IV q8h (4 hour infusion).  Adjust vancomycin to 1500 mg IV every 24 hours Estimated AUC:  458.4, Cmin 10.8 Wt 93 kg (used IBW), Scr 1.76, Vd coefficient 0.72   Pharmacy will continue to follow and will adjust abx dosing  based on renal function.  Temp (24hrs), Avg:98.1 F (36.7 C), Min:97.7 F (36.5 C), Max:98.8 F (37.1 C)   Recent Labs  Lab 04/29/23 1923 04/30/23 0540 05/01/23 0813  WBC 16.6* 12.6*  --   CREATININE 1.83* 1.54* 1.76*  LATICACIDVEN 1.3  --   --      Estimated Creatinine Clearance: 59.4 mL/min (A) (by C-G formula based on SCr of 1.76 mg/dL (H)).    No Known Allergies  Antimicrobials this admission: 6/10 Zosyn >> x 7 days 6/10 Vancomycin >> x 7 days  Microbiology results: 6/10 BCx: NG Pending   Thank you for allowing pharmacy to be a part of this patient's care.  Barrie Folk, PharmD Clinical Pharmacist 05/01/2023 9:35 AM

## 2023-05-01 NOTE — TOC Initial Note (Signed)
Transition of Care [facility]) - Initial/Assessment Note    Patient Details  Name: Richard Mendoza MRN: [id number] Date of Birth: 02-28-1976  Transition of Care [facility]) CM/SW Contact:    Garret Reddish, RN Phone Number: 05/01/2023, 1:10 PM  Clinical Narrative:   Chart reviewed.  Patient was admitted for LLE cellulitis.  Noted that MRI showed abscess.  Noted that Vascular Surgery has been consulted.  Noted plan for I and D on 05/02/2023.Noted that patient also has hx of necrotizing fasciitis status post BKA of left extrem.  I have spoken with [name].  He informs me that prior to admission he lived at home with his wife.  He reports that he was able to bath and dress himself.  He was able to drive.  Patient would talk himself to appointments and his wife would also assist with appointments.  Patient PCP is the [facility].  Patient reports that medications are affordable.  [facility]  Patient reports that he does have a prothesis for his left stump.  Patient reports that has a walk in shower that he build that has a seat in the shower.    Patient reports that he would like a wheelchair and 2 wheeled rolling walker on discharge.    TOC will continue to follow for discharge planning.                    Expected Discharge Plan: Home w Home Health Services Barriers to Discharge: No Barriers Identified   Patient Goals and CMS Choice            Expected Discharge Plan and Services   Discharge Planning Services: CM Consult Post Acute Care Choice: Home Health (Pending Medical workup) Living arrangements for the past 2 months: Single Family Home                                      Prior Living Arrangements/Services Living arrangements for the past 2 months: Single Family Home Lives with:: Adult Children, Minor Children Patient language and need for interpreter reviewed:: Yes Do you feel safe going back to the place where you live?: Yes      Need for Family Participation  in Patient Care: Yes (Comment) (Supportive Wife)   Current home services: DME (patient has walk in shower at home.  He would like a wheelchair and 2 wheeled rolling walker on discharge.  Patietn has a prothesis)    Activities of Daily Living Home Assistive Devices/Equipment: Prosthesis, Cane (specify quad or straight) ADL Screening (condition at time of admission) Patient's cognitive ability adequate to safely complete daily activities?: Yes Is the patient deaf or have difficulty hearing?: No Does the patient have difficulty seeing, even when wearing glasses/contacts?: No Does the patient have difficulty concentrating, remembering, or making decisions?: No Patient able to express need for assistance with ADLs?: Yes Does the patient have difficulty dressing or bathing?: No Independently performs ADLs?: Yes (appropriate for developmental age) Does the patient have difficulty walking or climbing stairs?: No Weakness of Legs: None Weakness of Arms/Hands: None  Permission Sought/Granted   Permission granted to share information with : Yes, Verbal Permission Granted              Emotional Assessment       Orientation: : Oriented to Self, Oriented to Place, Oriented to  Time, Oriented to Situation      Admission  diagnosis:  Hypokalemia [E87.6] Cellulitis of left lower extremity [L03.116] Left leg cellulitis [L03.116] Cellulitis and abscess of left leg [L03.116, L02.416] Patient Active Problem List   Diagnosis Date Noted   Cellulitis and abscess of left leg 04/30/2023   Left leg cellulitis 04/29/2023   Tobacco use 04/29/2023   Carpal tunnel syndrome 07/12/2022   Hyperglycemia due to type 2 diabetes mellitus (HCC) 07/12/2022   Polyneuropathy 07/12/2022   Ulnar neuropathy 07/12/2022   Acute epigastric pain 07/12/2022   Gastro-esophageal reflux disease without esophagitis 07/12/2022   Nausea and vomiting 07/12/2022   Abscess of right upper extremity 02/26/2022   Cellulitis of  left lower extremity without foot    Sepsis (HCC) 09/08/2021   Hypokalemia 09/06/2021   Hyponatremia 09/06/2021   PAD (peripheral artery disease) (HCC) 05/26/2021   Acute renal failure superimposed on stage 2 chronic kidney disease (HCC) 04/03/2021   Syncope 04/03/2021   Neck pain    Abnormal weight loss 12/09/2020   Change in bowel habit 12/09/2020   Constipation 12/09/2020   Epigastric pain 12/09/2020   Drug-induced constipation 12/09/2020   Periumbilical pain 12/09/2020   Fatty liver 12/09/2020   S/P BKA (below knee amputation), left (HCC) 11/20/2020   Necrotizing fasciitis of ankle and foot (HCC) 11/07/2020   History of 2019 novel coronavirus disease (COVID-19) 09/01/2020   Major depressive disorder, recurrent, in remission (HCC) 09/01/2020   Pain management contract signed 09/07/2019   Evaluation by psychiatric service required 08/18/2019   History of depression 08/18/2019   Primary osteoarthritis of both shoulders 07/16/2019   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) 07/16/2019   Chronic pain of both shoulders 07/16/2019   Cervical radicular pain 07/16/2019   S/P cervical spinal fusion 07/16/2019   Cervical spondylosis 07/16/2019   Cervical facet joint syndrome 07/16/2019   Chronic pain syndrome 07/16/2019   Anemia of chronic disease 05/25/2019   Osteomyelitis of right foot (HCC) 05/25/2019   Diabetic foot infection (HCC) 03/05/2019   Type 2 diabetes mellitus without complication (HCC) 01/01/2019   Abscess 11/25/2018   Diabetic peripheral neuropathy associated with type 2 diabetes mellitus (HCC) 06/16/2018   Hyperlipidemia associated with type 2 diabetes mellitus (HCC) 06/16/2018   Intractable nausea and vomiting 10/21/2017   HNP (herniated nucleus pulposus), cervical 09/09/2017   Displacement of cervical intervertebral disc without myelopathy 08/27/2017   Erectile dysfunction 07/31/2017   Gastroesophageal reflux disease 07/31/2017   Rotator cuff syndrome of  left shoulder 07/31/2017   Cellulitis of right leg 07/30/2016   Tobacco abuse disorder 07/30/2016   Routine general medical examination at a health care facility 01/20/2014   Diabetic neuropathy, type I diabetes mellitus (HCC) 01/20/2014   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   HYPERLIPIDEMIA 03/17/2009   DM type 2 with diabetic peripheral neuropathy (HCC) 03/02/2009   Uncontrolled diabetes mellitus with hyperglycemia (HCC) 12/08/2008   OBSTRUCTIVE SLEEP APNEA 12/08/2008   Essential hypertension 12/08/2008   Allergic rhinitis 12/15/2007   Other malaise and fatigue 12/15/2007   PCP:  Barbette Reichmann, MD Pharmacy:   [id number] [place], [place] - [address] [place] [address] Phone: [phone] Fax: [phone]  [id number] [place], [place] - [address] [facility] [address] [place] [address] Phone: [phone] Fax: [phone]     Social Determinants of Health (SDOH) Social History: SDOH Screenings   Food Insecurity: No Food Insecurity (04/30/2023)  Housing: Low Risk  (04/30/2023)  Transportation Needs: No Transportation Needs (04/30/2023)  Utilities: Not At Risk (04/30/2023)  Depression (PHQ2-9): Low Risk  (09/21/2019)  Tobacco Use: Medium Risk (04/30/2023)   SDOH Interventions:     Readmission Risk Interventions     No data to display

## 2023-05-01 NOTE — Plan of Care (Signed)
  Problem: Safety: Goal: Ability to remain free from injury will improve Outcome: Progressing   Problem: Skin Integrity: Goal: Risk for impaired skin integrity will decrease Outcome: Progressing   Problem: Pain Managment: Goal: General experience of comfort will improve Outcome: Progressing   Problem: Elimination: Goal: Will not experience complications related to bowel motility Outcome:  Problem: Education: Goal: Knowledge of General Education information will improve Description: Including pain rating scale, medication(s)/side effects and non-pharmacologic comfort measures Outcome: Progressing

## 2023-05-02 ENCOUNTER — Encounter
Admission: EM | Disposition: A | Discharge status: Home / Self Care | Payer: Self-pay | Source: Home / Self Care | Attending: Osteopathic Medicine

## 2023-05-02 ENCOUNTER — Encounter: Payer: Self-pay | Admitting: Osteopathic Medicine

## 2023-05-02 ENCOUNTER — Inpatient Hospital Stay: Payer: 59 | Admitting: Anesthesiology

## 2023-05-02 DIAGNOSIS — L03116 Cellulitis of left lower limb: Secondary | ICD-10-CM | POA: Diagnosis not present

## 2023-05-02 DIAGNOSIS — L02416 Cutaneous abscess of left lower limb: Secondary | ICD-10-CM | POA: Diagnosis not present

## 2023-05-02 HISTORY — PX: INCISION AND DRAINAGE ABSCESS: SHX5864

## 2023-05-02 LAB — CBC
HCT: 30 % — ABNORMAL LOW (ref 39.0–52.0)
Hemoglobin: 10 g/dL — ABNORMAL LOW (ref 13.0–17.0)
MCH: 28.3 pg (ref 26.0–34.0)
MCHC: 33.3 g/dL (ref 30.0–36.0)
MCV: 85 fL (ref 80.0–100.0)
Platelets: 214 10*3/uL (ref 150–400)
RBC: 3.53 MIL/uL — ABNORMAL LOW (ref 4.22–5.81)
RDW: 14.8 % (ref 11.5–15.5)
WBC: 8.4 10*3/uL (ref 4.0–10.5)
nRBC: 0 % (ref 0.0–0.2)

## 2023-05-02 LAB — BASIC METABOLIC PANEL
Anion gap: 8 (ref 5–15)
BUN: 16 mg/dL (ref 6–20)
CO2: 20 mmol/L — ABNORMAL LOW (ref 22–32)
Calcium: 8.4 mg/dL — ABNORMAL LOW (ref 8.9–10.3)
Chloride: 111 mmol/L (ref 98–111)
Creatinine, Ser: 1.73 mg/dL — ABNORMAL HIGH (ref 0.61–1.24)
GFR, Estimated: 48 mL/min — ABNORMAL LOW (ref >60)
Glucose, Bld: 164 mg/dL — ABNORMAL HIGH (ref 70–99)
Potassium: 3.1 mmol/L — ABNORMAL LOW (ref 3.5–5.1)
Sodium: 139 mmol/L (ref 135–145)

## 2023-05-02 LAB — GLUCOSE, CAPILLARY
Glucose-Capillary: 124 mg/dL — ABNORMAL HIGH (ref 70–99)
Glucose-Capillary: 152 mg/dL — ABNORMAL HIGH (ref 70–99)

## 2023-05-02 SURGERY — INCISION AND DRAINAGE, ABSCESS
Anesthesia: General | Laterality: Left

## 2023-05-02 MED ORDER — DIPHENHYDRAMINE HCL 50 MG/ML IJ SOLN
INTRAMUSCULAR | Status: DC | PRN
  Administered 2023-05-02: 12.5 mg via INTRAVENOUS

## 2023-05-02 MED ORDER — CHLORHEXIDINE GLUCONATE 0.12 % MT SOLN
15.0000 mL | Freq: Once | OROMUCOSAL | Status: AC
  Administered 2023-05-02: 15 mL via OROMUCOSAL

## 2023-05-02 MED ORDER — FENTANYL CITRATE (PF) 100 MCG/2ML IJ SOLN
INTRAMUSCULAR | Status: DC | PRN
  Administered 2023-05-02: 50 ug via INTRAVENOUS
  Administered 2023-05-02 (×2): 25 ug via INTRAVENOUS

## 2023-05-02 MED ORDER — CHLORHEXIDINE GLUCONATE 0.12 % MT SOLN: 15.0000 mL | Freq: Once | OROMUCOSAL | Status: DC

## 2023-05-02 MED ORDER — LIDOCAINE HCL (PF) 2 % IJ SOLN
INTRAMUSCULAR | Status: AC
  Filled 2023-05-02: qty 5

## 2023-05-02 MED ORDER — MIDAZOLAM HCL 2 MG/2ML IJ SOLN
INTRAMUSCULAR | Status: DC | PRN
  Administered 2023-05-02: 2 mg via INTRAVENOUS

## 2023-05-02 MED ORDER — PHENYLEPHRINE HCL (PRESSORS) 10 MG/ML IV SOLN
INTRAVENOUS | Status: DC | PRN
  Administered 2023-05-02: 120 ug via INTRAVENOUS
  Administered 2023-05-02: 80 ug via INTRAVENOUS

## 2023-05-02 MED ORDER — CEFAZOLIN SODIUM-DEXTROSE 2-4 GM/100ML-% IV SOLN
2.0000 g | INTRAVENOUS | Status: AC
  Administered 2023-05-02: 2 g via INTRAVENOUS

## 2023-05-02 MED ORDER — CHLORHEXIDINE GLUCONATE 0.12 % MT SOLN
OROMUCOSAL | Status: AC
  Filled 2023-05-02: qty 15

## 2023-05-02 MED ORDER — ONDANSETRON HCL 4 MG/2ML IJ SOLN
INTRAMUSCULAR | Status: DC | PRN
  Administered 2023-05-02: 4 mg via INTRAVENOUS

## 2023-05-02 MED ORDER — HYDROMORPHONE HCL 1 MG/ML IJ SOLN
1.0000 mg | Freq: Once | INTRAMUSCULAR | Status: AC | PRN
  Administered 2023-05-02: 1 mg via INTRAVENOUS

## 2023-05-02 MED ORDER — FENTANYL CITRATE (PF) 100 MCG/2ML IJ SOLN
INTRAMUSCULAR | Status: AC
  Filled 2023-05-02: qty 2

## 2023-05-02 MED ORDER — ONDANSETRON HCL 4 MG/2ML IJ SOLN: 4.0000 mg | Freq: Once | INTRAMUSCULAR | Status: DC | PRN

## 2023-05-02 MED ORDER — 0.9 % SODIUM CHLORIDE (POUR BTL) OPTIME
TOPICAL | Status: DC | PRN
  Administered 2023-05-02: 300 mL

## 2023-05-02 MED ORDER — PHENYLEPHRINE 80 MCG/ML (10ML) SYRINGE FOR IV PUSH (FOR BLOOD PRESSURE SUPPORT)
PREFILLED_SYRINGE | INTRAVENOUS | Status: AC
  Filled 2023-05-02: qty 10

## 2023-05-02 MED ORDER — CEFAZOLIN SODIUM-DEXTROSE 2-4 GM/100ML-% IV SOLN
INTRAVENOUS | Status: AC
  Filled 2023-05-02: qty 100

## 2023-05-02 MED ORDER — ACETAMINOPHEN 10 MG/ML IV SOLN
INTRAVENOUS | Status: DC | PRN
  Administered 2023-05-02: 1000 mg via INTRAVENOUS

## 2023-05-02 MED ORDER — EPHEDRINE 5 MG/ML INJ
INTRAVENOUS | Status: AC
  Filled 2023-05-02: qty 5

## 2023-05-02 MED ORDER — ONDANSETRON HCL 4 MG/2ML IJ SOLN
INTRAMUSCULAR | Status: AC
  Filled 2023-05-02: qty 2

## 2023-05-02 MED ORDER — ACETAMINOPHEN 10 MG/ML IV SOLN
INTRAVENOUS | Status: AC
  Filled 2023-05-02: qty 100

## 2023-05-02 MED ORDER — MIDAZOLAM HCL 2 MG/2ML IJ SOLN
INTRAMUSCULAR | Status: AC
  Filled 2023-05-02: qty 2

## 2023-05-02 MED ORDER — DIPHENHYDRAMINE HCL 50 MG/ML IJ SOLN: 50.0000 mg | Freq: Once | INTRAMUSCULAR | Status: DC | PRN

## 2023-05-02 MED ORDER — PROPOFOL 10 MG/ML IV BOLUS
INTRAVENOUS | Status: AC
  Filled 2023-05-02: qty 20

## 2023-05-02 MED ORDER — FENTANYL CITRATE PF 50 MCG/ML IJ SOSY: 12.5000 ug | PREFILLED_SYRINGE | Freq: Once | INTRAMUSCULAR | Status: DC | PRN

## 2023-05-02 MED ORDER — FENTANYL CITRATE (PF) 100 MCG/2ML IJ SOLN: 25.0000 ug | INTRAMUSCULAR | Status: DC | PRN

## 2023-05-02 MED ORDER — CHLORHEXIDINE GLUCONATE 4 % EX SOLN
60.0000 mL | Freq: Once | CUTANEOUS | Status: DC
  Administered 2023-05-02: 4 via TOPICAL

## 2023-05-02 MED ORDER — POTASSIUM CHLORIDE CRYS ER 20 MEQ PO TBCR
40.0000 meq | EXTENDED_RELEASE_TABLET | Freq: Once | ORAL | Status: AC
  Administered 2023-05-02: 40 meq via ORAL
  Filled 2023-05-02: qty 2

## 2023-05-02 MED ORDER — MIDAZOLAM HCL 2 MG/ML PO SYRP: 8.0000 mg | ORAL_SOLUTION | Freq: Once | ORAL | Status: DC | PRN

## 2023-05-02 MED ORDER — PROPOFOL 10 MG/ML IV BOLUS
INTRAVENOUS | Status: DC | PRN
  Administered 2023-05-02: 200 mg via INTRAVENOUS

## 2023-05-02 MED ORDER — DIPHENHYDRAMINE HCL 50 MG/ML IJ SOLN
INTRAMUSCULAR | Status: AC
  Filled 2023-05-02: qty 1

## 2023-05-02 MED ORDER — CHLORHEXIDINE GLUCONATE 4 % EX SOLN: 60.0000 mL | Freq: Once | CUTANEOUS | Status: DC

## 2023-05-02 MED ORDER — LIDOCAINE HCL (CARDIAC) PF 100 MG/5ML IV SOSY
PREFILLED_SYRINGE | INTRAVENOUS | Status: DC | PRN
  Administered 2023-05-02: 100 mg via INTRAVENOUS

## 2023-05-02 MED ORDER — METHYLPREDNISOLONE SODIUM SUCC 125 MG IJ SOLR: 125.0000 mg | Freq: Once | INTRAMUSCULAR | Status: DC | PRN

## 2023-05-02 MED ORDER — SODIUM CHLORIDE 0.9 % IV SOLN: INTRAVENOUS | Status: DC

## 2023-05-02 MED ORDER — ONDANSETRON HCL 4 MG/2ML IJ SOLN: 4.0000 mg | Freq: Four times a day (QID) | INTRAMUSCULAR | Status: DC | PRN

## 2023-05-02 MED ORDER — FAMOTIDINE 20 MG PO TABS: 40.0000 mg | ORAL_TABLET | Freq: Once | ORAL | Status: DC | PRN

## 2023-05-02 MED ORDER — EPHEDRINE SULFATE (PRESSORS) 50 MG/ML IJ SOLN
INTRAMUSCULAR | Status: DC | PRN
  Administered 2023-05-02: 7.5 mg via INTRAVENOUS

## 2023-05-02 SURGICAL SUPPLY — 42 items
APL PRP STRL LF DISP 70% ISPRP (MISCELLANEOUS)
BNDG GAUZE DERMACEA FLUFF 4 (GAUZE/BANDAGES/DRESSINGS) IMPLANT
BNDG GZE DERMACEA 4 6PLY (GAUZE/BANDAGES/DRESSINGS) ×1
BRUSH SCRUB EZ 4% CHG (MISCELLANEOUS) ×1 IMPLANT
CANISTER WOUND CARE 500ML ATS (WOUND CARE) IMPLANT
CHLORAPREP W/TINT 26 (MISCELLANEOUS) ×1 IMPLANT
DRAPE INCISE IOBAN 66X45 STRL (DRAPES) ×1 IMPLANT
DRSG TEGADERM 4X4.75 (GAUZE/BANDAGES/DRESSINGS) IMPLANT
DRSG VAC GRANUFOAM MED (GAUZE/BANDAGES/DRESSINGS) IMPLANT
ELECT CAUTERY BLADE 6.4 (BLADE) ×1 IMPLANT
ELECT REM PT RETURN 9FT ADLT (ELECTROSURGICAL) ×1
ELECTRODE REM PT RTRN 9FT ADLT (ELECTROSURGICAL) ×1 IMPLANT
GAUZE PACKING IODOFORM 1/2INX (GAUZE/BANDAGES/DRESSINGS) IMPLANT
GAUZE SPONGE 4X4 12PLY STRL (GAUZE/BANDAGES/DRESSINGS) IMPLANT
GLOVE BIO SURGEON STRL SZ7 (GLOVE) ×2 IMPLANT
GOWN STRL REUS W/ TWL LRG LVL3 (GOWN DISPOSABLE) ×2 IMPLANT
GOWN STRL REUS W/ TWL XL LVL3 (GOWN DISPOSABLE) ×1 IMPLANT
GOWN STRL REUS W/TWL LRG LVL3 (GOWN DISPOSABLE) ×2
GOWN STRL REUS W/TWL XL LVL3 (GOWN DISPOSABLE) ×1
IV NS 1000ML (IV SOLUTION) ×1
IV NS 1000ML BAXH (IV SOLUTION) ×1 IMPLANT
KIT TURNOVER KIT A (KITS) ×1 IMPLANT
LABEL OR SOLS (LABEL) ×1 IMPLANT
MANIFOLD NEPTUNE II (INSTRUMENTS) ×1 IMPLANT
NS IRRIG 500ML POUR BTL (IV SOLUTION) ×1 IMPLANT
PACK EXTREMITY ARMC (MISCELLANEOUS) ×1 IMPLANT
PACKING GAUZE IODOFORM 1INX5YD (GAUZE/BANDAGES/DRESSINGS) IMPLANT
PAD PREP OB/GYN DISP 24X41 (PERSONAL CARE ITEMS) ×1 IMPLANT
PULSAVAC PLUS IRRIG FAN TIP (DISPOSABLE)
SOL PREP PVP 2OZ (MISCELLANEOUS) ×1
SOLUTION PREP PVP 2OZ (MISCELLANEOUS) ×1 IMPLANT
SPONGE T-LAP 18X18 ~~LOC~~+RFID (SPONGE) ×1 IMPLANT
SUT ETHILON 4-0 (SUTURE)
SUT ETHILON 4-0 FS2 18XMFL BLK (SUTURE)
SUT VIC AB 3-0 SH 27 (SUTURE)
SUT VIC AB 3-0 SH 27X BRD (SUTURE) IMPLANT
SUTURE ETHLN 4-0 FS2 18XMF BLK (SUTURE) IMPLANT
SWAB CULTURE AMIES ANAERIB BLU (MISCELLANEOUS) ×1 IMPLANT
SYR BULB IRRIG 60ML STRL (SYRINGE) ×1 IMPLANT
TIP FAN IRRIG PULSAVAC PLUS (DISPOSABLE) ×1 IMPLANT
TRAP FLUID SMOKE EVACUATOR (MISCELLANEOUS) ×1 IMPLANT
WATER STERILE IRR 500ML POUR (IV SOLUTION) ×1 IMPLANT

## 2023-05-02 NOTE — Op Note (Signed)
    OPERATIVE NOTE   PROCEDURE: Incision and drainage of left medial lower thigh  PRE-OPERATIVE DIAGNOSIS: Abscess left medial lower thigh  POST-OPERATIVE DIAGNOSIS: Same as above  SURGEON: Festus Barren, MD  ASSISTANT(S): Rolla Plate, NP  ANESTHESIA: general  ESTIMATED BLOOD LOSS: 10 cc  FINDING(S): None  SPECIMEN(S):  culture swab sent  INDICATIONS:   Richard Mendoza is a 47 y.o. male who presents with an abscess on the left medial thigh.  He has imaging study that shows a fluid collection and this needs drained.  Risks and benefits are discussed. An assistant was present during the procedure to help facilitate the exposure and expedite the procedure.   DESCRIPTION: After obtaining full informed written consent, the patient was brought back to the operating room and placed supine upon the operating table.  The patient received IV antibiotics prior to induction.  After obtaining adequate anesthesia, the patient was prepped and draped in the standard fashion. The assistant provided retraction and mobilization to help facilitate exposure and expedite the procedure throughout the entire procedure.  This included following suture, using retractors, and optimizing lighting.   The area with a small opening was probed and a culture swab was sent.  The wound was then opened superiorly about 3 to 4 cm overlying the soft ballotable area and a significant amount of purulent material was evacuated.  The wound was irrigated copiously with sterile saline.  After all clearly non-viable tissue was removed, the wound was packed with 1 inch iodoform gauze and then a Curlex wrap was placed. The patient was then awakened from anesthesia and taken to the recovery room in stable condition having tolerated the procedure well.  COMPLICATIONS: none  CONDITION: stable  Festus Barren  05/02/2023, 1:34 PM   This note was created with Dragon Medical transcription system. Any errors in dictation are purely  unintentional.

## 2023-05-02 NOTE — Inpatient Diabetes Management (Signed)
Received Secure Chat from LPN caring for pt this AM.  Per LPN, CGM reading this AM on pt's Dexcom was 118.  Pt requesting to stop Insulin pump for now.      I responded back to the LPN with the following message: I would recommend that pt leave his insulin pump on for now. He does have the Dexcom G6 monitor where he can keep a close eye on his glucose levels. I saw his reading was 118 this AM.  If you could please tell him to leave his pump on for now and please use his Dexcom to watch his sugars closely. We really want good control especially since he's having surgery today. If his glucose drops below 90-100 mg/dl and he gets worried, he may suspend the pump for about 1 hour.     --Will follow patient during hospitalization--  Ambrose Finland RN, MSN, CDCES Diabetes Coordinator Inpatient Glycemic Control Team Team Pager: [phone] (8a-5p)

## 2023-05-02 NOTE — Interval H&P Note (Signed)
History and Physical Interval Note:  05/02/2023 12:04 PM  Richard Mendoza  has presented today for surgery, with the diagnosis of Left lower extremity abscess.  The various methods of treatment have been discussed with the patient and family. After consideration of risks, benefits and other options for treatment, the patient has consented to  Procedure(s): INCISION AND DRAINAGE ABSCESS LEFT LOWER EXTREMITY (Left) as a surgical intervention.  The patient's history has been reviewed, patient examined, no change in status, stable for surgery.  I have reviewed the patient's chart and labs.  Questions were answered to the patient's satisfaction.     Festus Barren

## 2023-05-02 NOTE — Progress Notes (Signed)
Pharmacy Antibiotic Note  Richard Mendoza is a 47 y.o. male admitted on 04/29/2023 with cellulitis.  Pharmacy has been consulted for Zosyn & Vancomycin dosing for 7 days.  Scr: 1.83>>1.54>>1.76>>1.73  Plan: Continue vancomycin 1500 mg IV every 24 hours Vancomycin peak ordered for 6/14 @ 0130 and vancomycin trough ordered for 6/14 @ 2100 Continue Zosyn 3.375 grams IV every 8 hours (4 hour infusion) Follow-up levels and renal function for adjustments  Temp (24hrs), Avg:98.2 F (36.8 C), Min:97.8 F (36.6 C), Max:98.6 F (37 C)   Recent Labs  Lab 04/29/23 1923 04/30/23 0540 05/01/23 0813 05/02/23 0349  WBC 16.6* 12.6*  --  8.4  CREATININE 1.83* 1.54* 1.76* 1.73*  LATICACIDVEN 1.3  --   --   --      Estimated Creatinine Clearance: 60.5 mL/min (A) (by C-G formula based on SCr of 1.73 mg/dL (H)).    No Known Allergies  Antimicrobials this admission: 6/10 Zosyn >> x 7 days 6/10 Vancomycin >> x 7 days  Microbiology results: 6/10 BCx: ngtd  Thank you for allowing pharmacy to be a part of this patient's care.  Barrie Folk, PharmD Clinical Pharmacist 05/02/2023 9:39 AM

## 2023-05-02 NOTE — Transfer of Care (Signed)
Immediate Anesthesia Transfer of Care Note  Patient: Richard Mendoza  Procedure(s) Performed: INCISION AND DRAINAGE ABSCESS LEFT LOWER EXTREMITY (Left)  Patient Location: PACU  Anesthesia Type:General  Level of Consciousness: drowsy  Airway & Oxygen Therapy: Patient Spontanous Breathing and Patient connected to face mask oxygen  Post-op Assessment: Report given to RN and Post -op Vital signs reviewed and stable  Post vital signs: Reviewed and stable  Last Vitals:  Vitals Value Taken Time  BP 135/78 05/02/23 1326  Temp 36.6 C 05/02/23 1326  Pulse 81 05/02/23 1333  Resp 0 05/02/23 1333  SpO2 100 % 05/02/23 1333  Vitals shown include unvalidated device data.  Last Pain:  Vitals:   05/02/23 1117  TempSrc: Oral  PainSc: 8          Complications: No notable events documented.

## 2023-05-02 NOTE — Progress Notes (Signed)
Per patient request this nurse reached out to RN diabetes coordinator regarding patient request to turn off his insulin pump. RN diabetes coordinator recommended patient leave insulin pump on (see notes.) This nurse informed patient of diabetes coordinators recommendation to leave insulin pump on. Patient stated "I know my body and my blood sugar will just keep dropping, I'm turning it off." Patient educated and Diabetes Coordinator notified.

## 2023-05-02 NOTE — Plan of Care (Signed)

## 2023-05-02 NOTE — Anesthesia Preprocedure Evaluation (Signed)
Anesthesia Evaluation  Patient identified by MRN, date of birth, ID band Patient awake    Reviewed: Allergy & Precautions, NPO status , Patient's Chart, lab work & pertinent test results  Airway Mallampati: II  TM Distance: >3 FB Neck ROM: Full    Dental  (+) Edentulous Upper, Edentulous Lower   Pulmonary neg pulmonary ROS, sleep apnea , COPD, Current Smoker and Patient abstained from smoking.   Pulmonary exam normal  + decreased breath sounds      Cardiovascular Exercise Tolerance: Poor hypertension, Pt. on medications + Peripheral Vascular Disease  negative cardio ROS Normal cardiovascular exam Rhythm:Regular Rate:Normal     Neuro/Psych    Depression    negative neurological ROS  negative psych ROS   GI/Hepatic negative GI ROS, Neg liver ROS,GERD  Medicated,,  Endo/Other  negative endocrine ROSdiabetes, Insulin Dependent  Morbid obesity  Renal/GU      Musculoskeletal   Abdominal  (+) + obese  Peds negative pediatric ROS (+)  Hematology negative hematology ROS (+)   Anesthesia Other Findings Past Medical History: 03/17/2009: DIABETES MELLITUS, TYPE II, UNCONTROLLED 12/08/2008: DM 03/17/2009: HYPERLIPIDEMIA 12/08/2008: HYPERTENSION 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  Past Surgical History: 11/07/2020: AMPUTATION; Left     Comment:  Procedure: AMPUTATION LEFT THIRD TOE WITH PARTIAL RAY               RESECTION;  Surgeon: Linus Galas, DPM;  Location: [facility]               ORS;  Service: Podiatry;  Laterality: Left; 11/16/2020: AMPUTATION; Left     Comment:  Procedure: AMPUTATION BELOW KNEE;  Surgeon: Annice Needy, MD;  Location: [facility] ORS;  Service: Vascular;                Laterality: Left; 09/09/2017: ANTERIOR CERVICAL DECOMP/DISCECTOMY FUSION; N/A     Comment:  Procedure: ANTERIOR CERVICAL DECOMPRESSION/DISCECTOMY               FUSION CERVICAL 6- CERVICAL 7;  Surgeon: Coletta Memos,               MD;   Location: [facility] OR;  Service: Neurosurgery;                Laterality: N/A;  ANTERIOR CERVICAL               DECOMPRESSION/DISCECTOMY FUSION CERVICAL 6- CERVICAL 7 No date: APPENDECTOMY 10/03/2017: I & D EXTREMITY; Right     Comment:  Procedure: IRRIGATION AND DEBRIDEMENT RIGHT WRIST;                Surgeon: Betha Loa, MD;  Location: [facility] OR;  Service:               Orthopedics;  Laterality: Right; 11/26/2018: I & D EXTREMITY; Right     Comment:  Procedure: IRRIGATION AND DEBRIDEMENT FASCIA ON RIGHT               FOOT;  Surgeon: Gwyneth Revels, DPM;  Location: [facility] ORS;              Service: Podiatry;  Laterality: Right; 03/06/2019: INCISION AND DRAINAGE; Right     Comment:  Procedure: INCISION AND DRAINAGE RIGHT FOOT, WITH 4th               RAY AMPUTATION;  Surgeon: Gwyneth Revels, DPM;  Location:  [facility] ORS;  Service: Podiatry;  Laterality: Right; 11/07/2020: INCISION AND DRAINAGE; Left     Comment:  Procedure: INCISION AND DRAINAGE;  Surgeon: Linus Galas,              DPM;  Location: [facility] ORS;  Service: Podiatry;                Laterality: Left; 11/11/2020: IRRIGATION AND DEBRIDEMENT FOOT; Left     Comment:  Procedure: IRRIGATION AND DEBRIDEMENT FOOT;  Surgeon:               Linus Galas, DPM;  Location: [facility] ORS;  Service:               Podiatry;  Laterality: Left; 05/15/2019: METATARSAL HEAD EXCISION; Right     Comment:  Procedure: OSTECTOMY;MET HEAD 5;  Surgeon: Gwyneth Revels, DPM;  Location: [facility] ORS;  Service: Podiatry;                Laterality: Right; No date: osteomylitis No date: ROTATOR CUFF REPAIR; Left  BMI    Body Mass Index: 29.42 kg/m      Reproductive/Obstetrics negative OB ROS                             Anesthesia Physical Anesthesia Plan  ASA: 3  Anesthesia Plan: General   Post-op Pain Management:    Induction: Intravenous  PONV Risk Score and Plan: Dexamethasone, Ondansetron, Midazolam and Treatment  may vary due to age or medical condition  Airway Management Planned: LMA  Additional Equipment:   Intra-op Plan:   Post-operative Plan: Extubation in OR  Informed Consent: I have reviewed the patients History and Physical, chart, labs and discussed the procedure including the risks, benefits and alternatives for the proposed anesthesia with the patient or authorized representative who has indicated his/her understanding and acceptance.     Dental Advisory Given  Plan Discussed with: CRNA and Surgeon  Anesthesia Plan Comments:        Anesthesia Quick Evaluation

## 2023-05-02 NOTE — Progress Notes (Signed)
PROGRESS NOTE    Richard Mendoza   ZOX:[id number] DOB: December 18, 1975  DOA: 04/29/2023 Date of Service: 05/02/23 PCP: Barbette Reichmann, MD     Brief Narrative / Hospital Course:  [name] is a 47 year old male with history of CKD 3A, history of necrotizing fasciitis status post L BKA, insulin-dependent diabetes mellitus type 1, hypertension, depression, anxiety, neuropathy, chronic pain syndrome, who presents to the emergency department for chief concerns of infection of the left lower extremity. 06/10: VSS, hypokalemia, AKI, leukocytosis, Admitted for cellulitis LLE.  06/11: MRI shows abscess adjacent to medial femoral condyle, vascular surgery consult planning I&D 06/13, WBC trend down 06/12: stable, continue abx and pain control. BCx NGx2d.   06/13: to OR for I&D LLE.    Consultants:  Vascular surgery   Procedures: [facility] I&D abscess LLE tomorrow 05/02/2023 w/ vascular surgery]     ASSESSMENT & PLAN:   Principal Problem:   Left leg cellulitis Active Problems:   Essential hypertension   Diabetic neuropathy, type I diabetes mellitus (HCC)   Chronic pain syndrome   Diabetic peripheral neuropathy associated with type 2 diabetes mellitus (HCC)   Hyperlipidemia associated with type 2 diabetes mellitus (HCC)   Tobacco abuse disorder   History of depression   Necrotizing fasciitis of ankle and foot (HCC)   S/P BKA (below knee amputation), left (HCC)   Constipation   Major depressive disorder, recurrent, in remission (HCC)   PAD (peripheral artery disease) (HCC)   Hypokalemia   Tobacco use   Left leg cellulitis LLE abscess  Blood cultures x 2 NGx2d Continue Vancomycin and cefepime  Pain control w/ hydrocodone-APAP 7.5-325 mg q6h prn + dilaudid for breakthrough pain (pt reports itching w/ oxycodone and morphine) Wound care following  Vascular team following - plan for I&D Thurs 06/13  Hypokalemia - resolved  serum magnesium level on admission was  normal monitor BMP   Essential hypertension Hydralazine 5 mg IV every 6 hours as needed for SBP greater than 175, 4 days ordered  Diabetic neuropathy, type I diabetes mellitus (HCC) Insulin pump per home regimen  Chronic pain syndrome PDMP reviewed - he is on hydrocodone-APAP 5-325 at home Pain control here as above   Tobacco use As needed nicotine patch ordered Counseling given, patient does not appear to be ready to quit     DVT prophylaxis: heparin  Pertinent IV fluids/nutrition: no continuous IV fluids  Central lines / invasive devices: none  Code Status: FULL CODE ACP documentation reviewed: 04/30/23 none on file   Current Admission Status: inpatient   TOC needs / Dispo plan: likely for home when stable, no TOC needs at this time  Barriers to discharge / significant pending items: surgery tomorrow and PT/OT following then. If blood cultures remain negative and pending surgery recs may be able to discharge 06/14              Subjective / Brief ROS:  Patient resting comfortably No concerns from RN  Family Communication: none at this time    Objective Findings:  Vitals:   05/02/23 1345 05/02/23 1400 05/02/23 1415 05/02/23 1452  BP: (!) 151/90 (!) 151/87 (!) 153/94 (!) 157/94  Pulse: 79 76 73 74  Resp: 15 12 (!) 0 16  Temp:    98 F (36.7 C)  TempSrc:    Oral  SpO2: 100% 99% 99% 99%  Weight:      Height:        Intake/Output Summary (Last 24 hours) at 05/02/2023 1820 Last  data filed at 05/02/2023 1558 Gross per 24 hour  Intake 600 ml  Output 902 ml  Net -302 ml    Filed Weights   04/29/23 1923 05/02/23 1117  Weight: 93 kg 93 kg    Examination:  Physical Exam Constitutional:      General: He is not in acute distress.    Appearance: He is not ill-appearing or toxic-appearing.  Cardiovascular:     Rate and Rhythm: Normal rate and regular rhythm.  Pulmonary:     Effort: Pulmonary effort is normal.  Musculoskeletal:     Comments: See  photos           Scheduled Medications:   atorvastatin  40 mg Oral Daily   benazepril  20 mg Oral Daily   chlorhexidine  15 mL Mouth/Throat Once   heparin  5,000 Units Subcutaneous Q8H   insulin pump   Subcutaneous TID WC, HS, 0200   leptospermum manuka honey  1 Application Topical Daily   pregabalin  75 mg Oral BID    Continuous Infusions:  sodium chloride 10 mL/hr at 04/30/23 2346   piperacillin-tazobactam (ZOSYN)  IV 3.375 g (05/02/23 0511)   vancomycin 1,500 mg (05/01/23 2230)    PRN Medications:  sodium chloride, acetaminophen **OR** acetaminophen, fentaNYL (SUBLIMAZE) injection, hydrALAZINE, HYDROcodone-acetaminophen, HYDROmorphone (DILAUDID) injection, melatonin, nicotine, ondansetron **OR** ondansetron (ZOFRAN) IV, ondansetron (ZOFRAN) IV, senna-docusate  Antimicrobials from admission:  Anti-infectives (From admission, onward)    Start     Dose/Rate Route Frequency Ordered Stop   05/02/23 0932  ceFAZolin (ANCEF) IVPB 2g/100 mL premix        2 g 200 mL/hr over 30 Minutes Intravenous 30 min pre-op 05/02/23 0932 05/02/23 1259   05/01/23 2200  vancomycin (VANCOREADY) IVPB 1500 mg/300 mL        1,500 mg 150 mL/hr over 120 Minutes Intravenous Every 24 hours 05/01/23 0935 05/06/23 2159   04/30/23 2200  vancomycin (VANCOREADY) IVPB 1500 mg/300 mL  Status:  Discontinued        1,500 mg 150 mL/hr over 120 Minutes Intravenous Every 24 hours 04/29/23 2309 04/30/23 1256   04/30/23 2200  vancomycin (VANCOREADY) IVPB 1750 mg/350 mL  Status:  Discontinued        1,750 mg 175 mL/hr over 120 Minutes Intravenous Every 24 hours 04/30/23 1256 05/01/23 0935   04/30/23 0400  piperacillin-tazobactam (ZOSYN) IVPB 3.375 g        3.375 g 12.5 mL/hr over 240 Minutes Intravenous Every 8 hours 04/29/23 2304 05/06/23 2159   04/29/23 2130  vancomycin (VANCOREADY) IVPB 2000 mg/400 mL        2,000 mg 200 mL/hr over 120 Minutes Intravenous  Once 04/29/23 2120 04/29/23 2354   04/29/23 2115   piperacillin-tazobactam (ZOSYN) IVPB 3.375 g        3.375 g 100 mL/hr over 30 Minutes Intravenous  Once 04/29/23 2108 04/29/23 2224           Data Reviewed:  I have personally reviewed the following...  CBC: Recent Labs  Lab 04/29/23 1923 04/30/23 0540 05/02/23 0349  WBC 16.6* 12.6* 8.4  NEUTROABS 13.6*  --   --   HGB 12.3* 10.5* 10.0*  HCT 36.8* 31.2* 30.0*  MCV 83.6 84.1 85.0  PLT 224 176 214    Basic Metabolic Panel: Recent Labs  Lab 04/29/23 1923 04/30/23 0540 05/01/23 0813 05/02/23 0349  NA 133* 136 140 139  K 2.7* 2.8* 3.5 3.1*  CL 102 109 113* 111  CO2 21* 20* 19*  20*  GLUCOSE 124* 161* 94 164*  BUN 18 16 17 16   CREATININE 1.83* 1.54* 1.76* 1.73*  CALCIUM 8.5* 7.6* 7.3* 8.4*  MG 2.2  --   --   --     GFR: Estimated Creatinine Clearance: 60.5 mL/min (A) (by C-G formula based on SCr of 1.73 mg/dL (H)). Liver Function Tests: Recent Labs  Lab 04/29/23 1923  AST 19  ALT 12  ALKPHOS 109  BILITOT 1.4*  PROT 7.3  ALBUMIN 3.3*    No results for input(s): "LIPASE", "AMYLASE" in the last 168 hours. No results for input(s): "AMMONIA" in the last 168 hours. Coagulation Profile: No results for input(s): "INR", "PROTIME" in the last 168 hours. Cardiac Enzymes: No results for input(s): "CKTOTAL", "CKMB", "CKMBINDEX", "TROPONINI" in the last 168 hours. BNP (last 3 results) No results for input(s): "PROBNP" in the last 8760 hours. HbA1C: Recent Labs    04/30/23 0540  HGBA1C 12.2*    CBG: Recent Labs  Lab 04/30/23 0231 05/02/23 1136 05/02/23 1329  GLUCAP 195* 124* 152*    Lipid Profile: No results for input(s): "CHOL", "HDL", "LDLCALC", "TRIG", "CHOLHDL", "LDLDIRECT" in the last 72 hours. Thyroid Function Tests: No results for input(s): "TSH", "T4TOTAL", "FREET4", "T3FREE", "THYROIDAB" in the last 72 hours. Anemia Panel: No results for input(s): "VITAMINB12", "FOLATE", "FERRITIN", "TIBC", "IRON", "RETICCTPCT" in the last 72 hours. Most  Recent Urinalysis On File:     Component Value Date/Time   COLORURINE STRAW (A) 04/30/2023 0540   APPEARANCEUR CLEAR (A) 04/30/2023 0540   LABSPEC 1.015 04/30/2023 0540   PHURINE 6.0 04/30/2023 0540   GLUCOSEU 150 (A) 04/30/2023 0540   HGBUR SMALL (A) 04/30/2023 0540   BILIRUBINUR NEGATIVE 04/30/2023 0540   KETONESUR NEGATIVE 04/30/2023 0540   PROTEINUR 100 (A) 04/30/2023 0540   UROBILINOGEN 0.2 07/31/2011 2217   NITRITE NEGATIVE 04/30/2023 0540   LEUKOCYTESUR NEGATIVE 04/30/2023 0540   Sepsis Labs: @LABRCNTIP (procalcitonin:4,lacticidven:4) Microbiology: Recent Results (from the past 240 hour(s))  Culture, blood (routine x 2)     Status: None (Preliminary result)   Collection Time: 04/29/23  9:39 PM   Specimen: BLOOD  Result Value Ref Range Status   Specimen Description BLOOD RIGHT ARM  Final   Special Requests   Final    BOTTLES DRAWN AEROBIC AND ANAEROBIC Blood Culture adequate volume   Culture   Final    NO GROWTH 2 DAYS Performed at [facility], [address] [place], [place] [address]    Report Status PENDING  Incomplete  Culture, blood (routine x 2)     Status: None (Preliminary result)   Collection Time: 04/29/23  9:43 PM   Specimen: BLOOD  Result Value Ref Range Status   Specimen Description BLOOD LEFT ARM  Final   Special Requests   Final    BOTTLES DRAWN AEROBIC AND ANAEROBIC Blood Culture adequate volume   Culture   Final    NO GROWTH 2 DAYS Performed at [facility], [address], [place], [place] [address]    Report Status PENDING  Incomplete      Radiology Studies last 3 days: MR KNEE LEFT W WO CONTRAST  Result Date: 04/30/2023 CLINICAL DATA:  Left below-the-knee amputation. Leg infection. Fever and chills. Redness to knee wound. EXAM: MR OF THE LEFT LOWER EXTREMITY WITHOUT AND WITH CONTRAST; MRI OF THE LEFT KNEE WITHOUT AND WITH CONTRAST TECHNIQUE: Multiplanar, multisequence MR imaging of the left femur was performed both  before and after administration of intravenous contrast. Multiplanar, multisequence  MR imaging of the left knee was performed both before and after administration of intravenous contrast. CONTRAST:  9mL GADAVIST GADOBUTROL 1 MMOL/ML IV SOLN COMPARISON:  left knee radiographs 04/29/2023, 11/25/2022, 07/12/2022 FINDINGS: Left femur: Bones/Joint/Cartilage Normal marrow signal within the left femur. The cortices are intact. No marrow edema. Enlarged of the images there is moderate bilateral femoroacetabular cartilage thinning and femoral head-neck junction degenerative osteophytes. There is a 13 mm decreased T1 decreased T2 signal sclerotic focus within the distal medial right femoral neck junction with the intertrochanteric region, compatible with a benign bone island. Overlying cortex is intact (coronal series 7, image 18). Muscles and Tendons Normal signal and size of the left thigh musculature. Soft tissues There is moderate subcutaneous fat edema and swelling within the distal 50% of the medial left thigh. At the level of the medial femoral condyle there is an oval predominantly decreased T1 increased T2 signal walled-off collection with internal septations and peripheral wall enhancement, measuring up to approximately 1.8 x 2.3 x 2.9 cm (transverse by AP by craniocaudal (axial series 17, image 84 and coronal series 11 image 16). This is suspicious for an abscess, superficial to the medial collateral ligament. There is mild coalescing fluid within the subcutaneous fat around this walled-off abscess. There are at least 5 enlarged left inguinal lymph nodes, measuring up to 1.9 x 2.6 cm in transverse dimensions (axial series 17, image 25). These are presumably reactive. -- Left knee: MENISCI Medial meniscus:  Intact. Lateral meniscus:  Intact. LIGAMENTS Cruciates: The ACL and PCL are intact. Collaterals: The medial collateral ligament is intact. The fibular collateral ligament, biceps femoris tendon, iliotibial band,  and popliteus tendon are intact. CARTILAGE Patellofemoral:  Intact. Medial:  Intact. Lateral:  Intact. Joint: Nojoint effusion. Normal Hoffa's fat pad. No plical thickening. Extensor Mechanism: Mild chronic enthesopathic change at the quadriceps insertion on the patella. The quadriceps tendon insertion is intact. Moderate chronic enthesopathic change at the patellar tendon origin. Moderate intermediate T2 signal proximal patellar tendinosis. Moderate chronic enthesopathic change at the patellar tendon insertion on the tibial tubercle with moderate intermediate T2 signal and thickening insertional tendinosis. Bones: Postsurgical changes are seen of amputation at the level of the proximal tibial diaphysis. The cortices are intact. No acute fracture. Soft tissues: There is moderate edema and swelling of the medial knee subcutaneous fat, contiguous with the distal thigh described above. Rim enhancing abscess within the subcutaneous fat medial to the medial femoral condyle as described above. There is metallic susceptibility artifact within the distal left lower extremity amputation soft tissues consistent with postsurgical change. There is mild-to-moderate edema and swelling of the subcutaneous fat at the distal amputation stump, tracking along the distal lateral subcutaneous fat greater than distal medial subcutaneous fat (coronal series 13 images 4 through 19 and sagittal series 16 images 12 through 37). This subcutaneous fat edema extends to the level of the distal tibial bone amputation site, however no definite edema or enhancement is seen within the bone marrow to indicate acute osteomyelitis. IMPRESSION: 1. There is moderate subcutaneous fat edema and swelling within the distal 50% of the medial left thigh and within the medial left knee. Within this subcutaneous fat swelling there is a walled-off abscess medial to the left medial femoral condyle measuring up to 2.9 cm in greatest dimension. 2. There is  mild-to-moderate subcutaneous fat edema and swelling at the distal left tibial amputation stump, tracking along the distal lateral greater than distal medial subcutaneous fat. This subcutaneous fat edema extends to the  level of the distal tibial bone amputation site, however no definite edema or enhancement is seen within the bone marrow to indicate acute osteomyelitis at this time. 3. There are at least 5 enlarged left inguinal lymph nodes, measuring up to 1.9 x 2.6 cm in transverse dimensions. These are presumably reactive. Electronically Signed   By: Neita Garnet M.D.   On: 04/30/2023 09:49   MR FEMUR LEFT W WO CONTRAST  Result Date: 04/30/2023 CLINICAL DATA:  Left below-the-knee amputation. Leg infection. Fever and chills. Redness to knee wound. EXAM: MR OF THE LEFT LOWER EXTREMITY WITHOUT AND WITH CONTRAST; MRI OF THE LEFT KNEE WITHOUT AND WITH CONTRAST TECHNIQUE: Multiplanar, multisequence MR imaging of the left femur was performed both before and after administration of intravenous contrast. Multiplanar, multisequence MR imaging of the left knee was performed both before and after administration of intravenous contrast. CONTRAST:  9mL GADAVIST GADOBUTROL 1 MMOL/ML IV SOLN COMPARISON:  left knee radiographs 04/29/2023, 11/25/2022, 07/12/2022 FINDINGS: Left femur: Bones/Joint/Cartilage Normal marrow signal within the left femur. The cortices are intact. No marrow edema. Enlarged of the images there is moderate bilateral femoroacetabular cartilage thinning and femoral head-neck junction degenerative osteophytes. There is a 13 mm decreased T1 decreased T2 signal sclerotic focus within the distal medial right femoral neck junction with the intertrochanteric region, compatible with a benign bone island. Overlying cortex is intact (coronal series 7, image 18). Muscles and Tendons Normal signal and size of the left thigh musculature. Soft tissues There is moderate subcutaneous fat edema and swelling within the  distal 50% of the medial left thigh. At the level of the medial femoral condyle there is an oval predominantly decreased T1 increased T2 signal walled-off collection with internal septations and peripheral wall enhancement, measuring up to approximately 1.8 x 2.3 x 2.9 cm (transverse by AP by craniocaudal (axial series 17, image 84 and coronal series 11 image 16). This is suspicious for an abscess, superficial to the medial collateral ligament. There is mild coalescing fluid within the subcutaneous fat around this walled-off abscess. There are at least 5 enlarged left inguinal lymph nodes, measuring up to 1.9 x 2.6 cm in transverse dimensions (axial series 17, image 25). These are presumably reactive. -- Left knee: MENISCI Medial meniscus:  Intact. Lateral meniscus:  Intact. LIGAMENTS Cruciates: The ACL and PCL are intact. Collaterals: The medial collateral ligament is intact. The fibular collateral ligament, biceps femoris tendon, iliotibial band, and popliteus tendon are intact. CARTILAGE Patellofemoral:  Intact. Medial:  Intact. Lateral:  Intact. Joint: Nojoint effusion. Normal Hoffa's fat pad. No plical thickening. Extensor Mechanism: Mild chronic enthesopathic change at the quadriceps insertion on the patella. The quadriceps tendon insertion is intact. Moderate chronic enthesopathic change at the patellar tendon origin. Moderate intermediate T2 signal proximal patellar tendinosis. Moderate chronic enthesopathic change at the patellar tendon insertion on the tibial tubercle with moderate intermediate T2 signal and thickening insertional tendinosis. Bones: Postsurgical changes are seen of amputation at the level of the proximal tibial diaphysis. The cortices are intact. No acute fracture. Soft tissues: There is moderate edema and swelling of the medial knee subcutaneous fat, contiguous with the distal thigh described above. Rim enhancing abscess within the subcutaneous fat medial to the medial femoral condyle as  described above. There is metallic susceptibility artifact within the distal left lower extremity amputation soft tissues consistent with postsurgical change. There is mild-to-moderate edema and swelling of the subcutaneous fat at the distal amputation stump, tracking along the distal lateral subcutaneous fat greater than  distal medial subcutaneous fat (coronal series 13 images 4 through 19 and sagittal series 16 images 12 through 37). This subcutaneous fat edema extends to the level of the distal tibial bone amputation site, however no definite edema or enhancement is seen within the bone marrow to indicate acute osteomyelitis. IMPRESSION: 1. There is moderate subcutaneous fat edema and swelling within the distal 50% of the medial left thigh and within the medial left knee. Within this subcutaneous fat swelling there is a walled-off abscess medial to the left medial femoral condyle measuring up to 2.9 cm in greatest dimension. 2. There is mild-to-moderate subcutaneous fat edema and swelling at the distal left tibial amputation stump, tracking along the distal lateral greater than distal medial subcutaneous fat. This subcutaneous fat edema extends to the level of the distal tibial bone amputation site, however no definite edema or enhancement is seen within the bone marrow to indicate acute osteomyelitis at this time. 3. There are at least 5 enlarged left inguinal lymph nodes, measuring up to 1.9 x 2.6 cm in transverse dimensions. These are presumably reactive. Electronically Signed   By: Neita Garnet M.D.   On: 04/30/2023 09:49   DG Knee Complete 4 Views Left  Result Date: 04/29/2023 CLINICAL DATA:  Pressure wound, cellulitis. EXAM: LEFT KNEE - COMPLETE 4+ VIEW COMPARISON:  Knee radiographs 11/25/2022. FINDINGS: The patient is status post below-knee amputation. There is soft tissue irregularity overlying the distal aspect of the stump which may correspond to the reported wound, with surrounding swelling. There  is no osseous erosion or destruction in the underlying bone. Knee alignment is maintained. The joint spaces are preserved. IMPRESSION: Status post below-knee amputation with soft tissue irregularity overlying the distal aspect of the stump which may correspond to the reported wound, with surrounding swelling. No erosion or destruction of the underlying bone. Electronically Signed   By: Lesia Hausen M.D.   On: 04/29/2023 21:37             LOS: 3 days        Sunnie Nielsen, DO [facility] 05/02/2023, 6:20 PM    Dictation software may have been used to generate the above note. Typos may occur and escape review in typed/dictated notes. Please contact Dr Lyn Hollingshead directly for clarity if needed.  Staff may message me via secure chat in Epic  but this may not receive an immediate response,  please page me for urgent matters!  If 7PM-7AM, please contact night coverage [email or web address]

## 2023-05-02 NOTE — Anesthesia Postprocedure Evaluation (Signed)
Anesthesia Post Note  Patient: Richard Mendoza  Procedure(s) Performed: INCISION AND DRAINAGE ABSCESS LEFT LOWER EXTREMITY (Left)  Patient location during evaluation: PACU Anesthesia Type: General Level of consciousness: awake Pain management: pain level controlled Respiratory status: nonlabored ventilation Cardiovascular status: stable Anesthetic complications: no   No notable events documented.   Last Vitals:  Vitals:   05/02/23 1400 05/02/23 1415  BP: (!) 151/87 (!) 153/94  Pulse: 76 73  Resp: 12 (!) 0  Temp:    SpO2: 99% 99%    Last Pain:  Vitals:   05/02/23 1326  TempSrc:   PainSc: Asleep                 VAN STAVEREN,Hannahgrace Lalli

## 2023-05-02 NOTE — Anesthesia Procedure Notes (Addendum)
Procedure Name: LMA Insertion Date/Time: 05/02/2023 12:40 PM  Performed by: Kit Mollett, [place], CRNAPre-anesthesia Checklist: Patient identified, Patient being monitored, Timeout performed, Emergency Drugs available and Suction available Patient Re-evaluated:Patient Re-evaluated prior to induction Oxygen Delivery Method: Circle system utilized Preoxygenation: Pre-oxygenation with 100% oxygen Induction Type: IV induction Ventilation: Mask ventilation without difficulty LMA: LMA inserted LMA Size: 4.0 Tube type: Oral Number of attempts: 1 Placement Confirmation: positive ETCO2 and breath sounds checked- equal and bilateral Tube secured with: Tape Dental Injury: Teeth and Oropharynx as per pre-operative assessment

## 2023-05-03 ENCOUNTER — Encounter: Payer: Self-pay | Admitting: Vascular Surgery

## 2023-05-03 ENCOUNTER — Ambulatory Visit (INDEPENDENT_AMBULATORY_CARE_PROVIDER_SITE_OTHER): Payer: 59 | Admitting: Nurse Practitioner

## 2023-05-03 DIAGNOSIS — L03116 Cellulitis of left lower limb: Secondary | ICD-10-CM | POA: Diagnosis not present

## 2023-05-03 LAB — BASIC METABOLIC PANEL
Anion gap: 8 (ref 5–15)
BUN: 18 mg/dL (ref 6–20)
CO2: 21 mmol/L — ABNORMAL LOW (ref 22–32)
Calcium: 8.3 mg/dL — ABNORMAL LOW (ref 8.9–10.3)
Chloride: 110 mmol/L (ref 98–111)
Creatinine, Ser: 1.58 mg/dL — ABNORMAL HIGH (ref 0.61–1.24)
GFR, Estimated: 54 mL/min — ABNORMAL LOW (ref >60)
Glucose, Bld: 237 mg/dL — ABNORMAL HIGH (ref 70–99)
Potassium: 3.6 mmol/L (ref 3.5–5.1)
Sodium: 139 mmol/L (ref 135–145)

## 2023-05-03 LAB — CBC
HCT: 31.2 % — ABNORMAL LOW (ref 39.0–52.0)
Hemoglobin: 10.1 g/dL — ABNORMAL LOW (ref 13.0–17.0)
MCH: 28 pg (ref 26.0–34.0)
MCHC: 32.4 g/dL (ref 30.0–36.0)
MCV: 86.4 fL (ref 80.0–100.0)
Platelets: 195 10*3/uL (ref 150–400)
RBC: 3.61 MIL/uL — ABNORMAL LOW (ref 4.22–5.81)
RDW: 14.8 % (ref 11.5–15.5)
WBC: 6.7 10*3/uL (ref 4.0–10.5)
nRBC: 0 % (ref 0.0–0.2)

## 2023-05-03 LAB — AEROBIC/ANAEROBIC CULTURE W GRAM STAIN (SURGICAL/DEEP WOUND)

## 2023-05-03 LAB — VANCOMYCIN, PEAK: Vancomycin Pk: 23 ug/mL — ABNORMAL LOW (ref 30–40)

## 2023-05-03 LAB — CULTURE, BLOOD (ROUTINE X 2): Special Requests: ADEQUATE

## 2023-05-03 MED ORDER — HYDROCODONE-ACETAMINOPHEN 10-325 MG PO TABS: 1.0000 | ORAL_TABLET | Freq: Four times a day (QID) | ORAL | 0 refills | Status: DC | PRN

## 2023-05-03 MED ORDER — DOXYCYCLINE HYCLATE 100 MG PO TABS: 100.0000 mg | ORAL_TABLET | Freq: Two times a day (BID) | ORAL | 0 refills | Status: AC

## 2023-05-03 MED ORDER — HYDROMORPHONE HCL 1 MG/ML IJ SOLN
0.5000 mg | Freq: Once | INTRAMUSCULAR | Status: AC
  Administered 2023-05-03: 0.5 mg via INTRAVENOUS
  Filled 2023-05-03: qty 1

## 2023-05-03 NOTE — Plan of Care (Signed)
Problem: Education: Goal: Ability to describe self-care measures that may prevent or decrease complications (Diabetes Survival Skills Education) will improve 05/03/2023 1535 by Suezanne Cheshire, RN Outcome: Progressing 05/03/2023 1534 by Suezanne Cheshire, RN Outcome: Progressing Goal: Individualized Educational Video(s) 05/03/2023 1535 by Suezanne Cheshire, RN Outcome: Progressing 05/03/2023 1534 by Suezanne Cheshire, RN Outcome: Progressing   Problem: Coping: Goal: Ability to adjust to condition or change in health will improve 05/03/2023 1535 by Suezanne Cheshire, RN Outcome: Progressing 05/03/2023 1534 by Suezanne Cheshire, RN Outcome: Progressing   Problem: Fluid Volume: Goal: Ability to maintain a balanced intake and output will improve 05/03/2023 1535 by Suezanne Cheshire, RN Outcome: Progressing 05/03/2023 1534 by Suezanne Cheshire, RN Outcome: Progressing   Problem: Health Behavior/Discharge Planning: Goal: Ability to identify and utilize available resources and services will improve 05/03/2023 1535 by Suezanne Cheshire, RN Outcome: Progressing 05/03/2023 1534 by Suezanne Cheshire, RN Outcome: Progressing Goal: Ability to manage health-related needs will improve 05/03/2023 1535 by Suezanne Cheshire, RN Outcome: Progressing 05/03/2023 1534 by Suezanne Cheshire, RN Outcome: Progressing   Problem: Metabolic: Goal: Ability to maintain appropriate glucose levels will improve 05/03/2023 1535 by Suezanne Cheshire, RN Outcome: Progressing 05/03/2023 1534 by Suezanne Cheshire, RN Outcome: Progressing   Problem: Nutritional: Goal: Maintenance of adequate nutrition will improve 05/03/2023 1535 by Suezanne Cheshire, RN Outcome: Progressing 05/03/2023 1534 by Suezanne Cheshire, RN Outcome: Progressing Goal: Progress toward achieving an optimal weight will improve 05/03/2023 1535 by Suezanne Cheshire, RN Outcome: Progressing 05/03/2023 1534 by Suezanne Cheshire,  RN Outcome: Progressing   Problem: Skin Integrity: Goal: Risk for impaired skin integrity will decrease 05/03/2023 1535 by Suezanne Cheshire, RN Outcome: Progressing 05/03/2023 1534 by Suezanne Cheshire, RN Outcome: Progressing   Problem: Tissue Perfusion: Goal: Adequacy of tissue perfusion will improve 05/03/2023 1535 by Suezanne Cheshire, RN Outcome: Progressing 05/03/2023 1534 by Suezanne Cheshire, RN Outcome: Progressing   Problem: Education: Goal: Knowledge of General Education information will improve Description: Including pain rating scale, medication(s)/side effects and non-pharmacologic comfort measures 05/03/2023 1535 by Suezanne Cheshire, RN Outcome: Progressing 05/03/2023 1534 by Suezanne Cheshire, RN Outcome: Progressing   Problem: Health Behavior/Discharge Planning: Goal: Ability to manage health-related needs will improve 05/03/2023 1535 by Suezanne Cheshire, RN Outcome: Progressing 05/03/2023 1534 by Suezanne Cheshire, RN Outcome: Progressing   Problem: Clinical Measurements: Goal: Ability to maintain clinical measurements within normal limits will improve 05/03/2023 1535 by Suezanne Cheshire, RN Outcome: Progressing 05/03/2023 1534 by Suezanne Cheshire, RN Outcome: Progressing Goal: Will remain free from infection 05/03/2023 1535 by Suezanne Cheshire, RN Outcome: Progressing 05/03/2023 1534 by Suezanne Cheshire, RN Outcome: Progressing Goal: Diagnostic test results will improve 05/03/2023 1535 by Suezanne Cheshire, RN Outcome: Progressing 05/03/2023 1534 by Suezanne Cheshire, RN Outcome: Progressing Goal: Respiratory complications will improve 05/03/2023 1535 by Suezanne Cheshire, RN Outcome: Progressing 05/03/2023 1534 by Suezanne Cheshire, RN Outcome: Progressing Goal: Cardiovascular complication will be avoided 05/03/2023 1535 by Suezanne Cheshire, RN Outcome: Progressing 05/03/2023 1534 by Suezanne Cheshire, RN Outcome: Progressing    Problem: Activity: Goal: Risk for activity intolerance will decrease 05/03/2023 1535 by Suezanne Cheshire, RN Outcome: Progressing 05/03/2023 1534 by Suezanne Cheshire, RN Outcome: Progressing   Problem: Nutrition: Goal: Adequate nutrition will be maintained 05/03/2023 1535 by Suezanne Cheshire, RN Outcome: Progressing 05/03/2023 1534 by Suezanne Cheshire, RN Outcome: Progressing  Problem: Coping: Goal: Level of anxiety will decrease 05/03/2023 1535 by Suezanne Cheshire, RN Outcome: Progressing 05/03/2023 1534 by Suezanne Cheshire, RN Outcome: Progressing   Problem: Elimination: Goal: Will not experience complications related to bowel motility 05/03/2023 1535 by Suezanne Cheshire, RN Outcome: Progressing 05/03/2023 1534 by Suezanne Cheshire, RN Outcome: Progressing Goal: Will not experience complications related to urinary retention 05/03/2023 1535 by Suezanne Cheshire, RN Outcome: Progressing 05/03/2023 1534 by Suezanne Cheshire, RN Outcome: Progressing   Problem: Pain Managment: Goal: General experience of comfort will improve 05/03/2023 1535 by Suezanne Cheshire, RN Outcome: Progressing 05/03/2023 1534 by Suezanne Cheshire, RN Outcome: Progressing   Problem: Safety: Goal: Ability to remain free from injury will improve 05/03/2023 1535 by Suezanne Cheshire, RN Outcome: Progressing 05/03/2023 1534 by Suezanne Cheshire, RN Outcome: Progressing   Problem: Skin Integrity: Goal: Risk for impaired skin integrity will decrease 05/03/2023 1535 by Suezanne Cheshire, RN Outcome: Progressing 05/03/2023 1534 by Suezanne Cheshire, RN Outcome: Progressing

## 2023-05-03 NOTE — Plan of Care (Signed)

## 2023-05-03 NOTE — Progress Notes (Signed)
OT Cancellation Note  Patient Details Name: Richard Mendoza MRN: [id number] DOB: [birth year]   Cancelled Treatment:    Reason Eval/Treat Not Completed: OT screened, no needs identified, will sign off. Chart reviewed. Pt reports at baseline functional independence, denies needs, requests DME and refuses attempts at education. No OT needs, will sign off.   Kathie Dike, M.S. OTR/L  05/03/23, 10:04 AM  ascom [phone]

## 2023-05-03 NOTE — Progress Notes (Signed)
Pharmacy Antibiotic Note  Richard Mendoza is a 47 y.o. male admitted on 04/29/2023 with cellulitis.  Pharmacy has been consulted for Zosyn & Vancomycin dosing for 7 days.  Scr: 1.83>>1.54>>1.76>>1.73>>1.58  Plan: Continue vancomycin 1500 mg IV every 24 hours Last vancomycin dose 6/13 @ 2140 Vancomycin peak 6/14 @ 0154 = 23 Vancomycin trough scheduled for 6/14 @ 2100 Continue Zosyn 3.375 grams IV every 8 hours (4 hour infusion) Follow-up levels and renal function for adjustments  Temp (24hrs), Avg:98 F (36.7 C), Min:97.8 F (36.6 C), Max:98.3 F (36.8 C)   Recent Labs  Lab 04/29/23 1923 04/30/23 0540 05/01/23 0813 05/02/23 0349 05/03/23 0154 05/03/23 0851  WBC 16.6* 12.6*  --  8.4  --  6.7  CREATININE 1.83* 1.54* 1.76* 1.73*  --  1.58*  LATICACIDVEN 1.3  --   --   --   --   --   VANCOPEAK  --   --   --   --  23*  --      Estimated Creatinine Clearance: 66.2 mL/min (A) (by C-G formula based on SCr of 1.58 mg/dL (H)).    No Known Allergies  Antimicrobials this admission: 6/10 Zosyn >> x 7 days 6/10 Vancomycin >> x 7 days  Microbiology results: 6/10 BCx: ngtd 6/13 Surgical culture: pending  Thank you for allowing pharmacy to be a part of this patient's care.  Barrie Folk, PharmD Clinical Pharmacist 05/03/2023 9:16 AM

## 2023-05-03 NOTE — Discharge Summary (Signed)
Physician Discharge Summary   Patient: Richard Mendoza MRN: [id number]  DOB: 04/24/1976   Admit:     Date of Admission: 04/29/2023 Admitted from: home   Discharge: Date of discharge: 05/03/23 Disposition: Home Condition at discharge: good  CODE STATUS: FULL CODE     Discharge Physician: Sunnie Nielsen, DO [facility]     PCP: Barbette Reichmann, MD  Recommendations for Outpatient Follow-up:  Follow up with PCP Barbette Reichmann, MD in 2-4 weeks Follow w/ Dr Wyn Quaker as directed - please call his office if you do not hear about a follow-up appointment by Monday  Please obtain labs/tests: CBC, BMP in 2-4 weeks Please follow up on the following pending results: final wound cultures  PCP AND OTHER OUTPATIENT PROVIDERS: SEE BELOW FOR SPECIFIC DISCHARGE INSTRUCTIONS PRINTED FOR PATIENT IN ADDITION TO GENERIC AVS PATIENT INFO     Discharge Instructions     Call MD for:  redness, tenderness, or signs of infection (pain, swelling, redness, odor or green/yellow discharge around incision site)   Complete by: As directed    Call MD for:  severe uncontrolled pain   Complete by: As directed    Call MD for:  temperature >100.4   Complete by: As directed    Diet - low sodium heart healthy   Complete by: As directed    Discharge wound care:   Complete by: As directed    Dressing change Once daily. Remove packing and replace with 1 inch iodoform guaze packing tape. Cover with Guaze and Tegaderm.   Increase activity slowly   Complete by: As directed          Discharge Diagnoses: Principal Problem:   Left leg cellulitis Active Problems:   Essential hypertension   Diabetic neuropathy, type I diabetes mellitus (HCC)   Chronic pain syndrome   Diabetic peripheral neuropathy associated with type 2 diabetes mellitus (HCC)   Hyperlipidemia associated with type 2 diabetes mellitus (HCC)   Tobacco abuse disorder   History of depression   Necrotizing fasciitis of ankle and  foot (HCC)   S/P BKA (below knee amputation), left (HCC)   Constipation   Major depressive disorder, recurrent, in remission (HCC)   PAD (peripheral artery disease) (HCC)   Hypokalemia   Tobacco use   Cellulitis and abscess of left leg       Hospital Course: Richard Mendoza is a 47 year old male with history of CKD 3A, history of necrotizing fasciitis status post L BKA, insulin-dependent diabetes mellitus type 1, hypertension, depression, anxiety, neuropathy, chronic pain syndrome, who presents to the emergency department for chief concerns of infection of the left lower extremity. 06/10: VSS, hypokalemia, AKI, leukocytosis, Admitted for cellulitis LLE.  06/11: MRI shows abscess adjacent to medial femoral condyle, vascular surgery consult planning I&D 06/13, WBC trend down 06/12: stable, continue abx and pain control. BCx NGx2d.   06/13: to OR for I&D LLE.  06/14: stable postop, DME arranged, pt clear for d/c by vascular team    Consultants:  Vascular surgery   Procedures: I&D abscess LLE 05/02/2023 w/ vascular surgery     ASSESSMENT & PLAN:   Left leg cellulitis LLE abscess  Blood cultures x 2 NGx2d Doxycycline on discharge  Vascular team following - s/p I&D Thurs 06/13  Hypokalemia - resolved  serum magnesium level on admission was normal monitor BMP   Essential hypertension Monitor outpatient   Diabetic neuropathy, type I diabetes mellitus (HCC) Insulin pump per home regimen  Chronic pain syndrome PDMP  reviewed - he is on hydrocodone-APAP 5-325 at home Pain control - increased home Norco to 10-325 po q6h prn temporarily for #20 tabs total  Tobacco use As needed nicotine patch ordered Counseling given, patient does not appear to be ready to quit              Discharge Instructions  Allergies as of 05/03/2023   No Known Allergies      Medication List     STOP taking these medications    pregabalin 75 MG capsule Commonly known as:  Lyrica       TAKE these medications    atorvastatin 40 MG tablet Commonly known as: LIPITOR Take 40 mg by mouth daily.   benazepril 20 MG tablet Commonly known as: LOTENSIN Take 20 mg by mouth daily.   [facility] Senna 8.6 MG tablet Generic drug: senna Take 1 tablet by mouth 2 (two) times daily as needed for constipation.   doxycycline 100 MG tablet Commonly known as: VIBRA-TABS Take 1 tablet (100 mg total) by mouth 2 (two) times daily for 5 days. Start taking on: May 04, 2023   Fiasp 100 UNIT/ML Soln Generic drug: Insulin Aspart (w/Niacinamide) Inject into the skin continuous. Via pump   HYDROcodone-acetaminophen 10-325 MG tablet Commonly known as: NORCO Take 1 tablet by mouth every 6 (six) hours as needed for moderate pain or severe pain.               Durable Medical Equipment  (From admission, onward)           Start     Ordered   05/03/23 1219  DME Walker  Once       Question Answer Comment  Walker: With 5 Inch Wheels   Patient needs a walker to treat with the following condition Cellulitis and abscess of left leg      05/03/23 1220   05/03/23 1219  DME standard manual wheelchair with seat cushion  Once       Comments: Patient suffers from abscess LLE and s/p BKA LLE which impairs their ability to perform daily activities like bathing, dressing, and toileting in the home.  A cane, crutch, or walker will not resolve issue with performing activities of daily living. A wheelchair will allow patient to safely perform daily activities. Patient can safely propel the wheelchair in the home or has a caregiver who can provide assistance. Length of need 6 months . Accessories: elevating leg rests (ELRs), wheel locks, extensions and anti-tippers.   05/03/23 1220              Discharge Care Instructions  (From admission, onward)           Start     Ordered   05/03/23 0000  Discharge wound care:       Comments: Dressing change Once daily. Remove packing  and replace with 1 inch iodoform guaze packing tape. Cover with Guaze and Tegaderm.   05/03/23 1220             Follow-up Information     Dew, Marlow Baars, MD Follow up.   Specialties: Vascular Surgery, Radiology, Interventional Cardiology Contact information: [address] [place] [address] [phone]                 No Known Allergies   Subjective: pt feeling better this morning, no concerns after surgery   Discharge Exam: BP (!) 182/92 (BP Location: Right Arm)   Pulse 71  Temp 97.8 F (36.6 C)   Resp 18   Ht 5\' 10"  (1.778 m)   Wt 93 kg   SpO2 100%   BMI 29.42 kg/m  General: Pt is alert, awake, not in acute distress Cardiovascular: RRR, S1/S2 +, no rubs, no gallops Respiratory: CTA bilaterally, no wheezing, no rhonchi Abdominal: Soft, NT, ND, bowel sounds + Extremities: no edema, no cyanosis     The results of significant diagnostics from this hospitalization (including imaging, microbiology, ancillary and laboratory) are listed below for reference.     Microbiology: Recent Results (from the past 240 hour(s))  Culture, blood (routine x 2)     Status: None (Preliminary result)   Collection Time: 04/29/23  9:39 PM   Specimen: BLOOD  Result Value Ref Range Status   Specimen Description BLOOD RIGHT ARM  Final   Special Requests   Final    BOTTLES DRAWN AEROBIC AND ANAEROBIC Blood Culture adequate volume   Culture   Final    NO GROWTH 4 DAYS Performed at [facility], [address] [place], [place] [address]    Report Status PENDING  Incomplete  Culture, blood (routine x 2)     Status: None (Preliminary result)   Collection Time: 04/29/23  9:43 PM   Specimen: BLOOD  Result Value Ref Range Status   Specimen Description BLOOD LEFT ARM  Final   Special Requests   Final    BOTTLES DRAWN AEROBIC AND ANAEROBIC Blood Culture adequate volume   Culture   Final    NO GROWTH 4 DAYS Performed at [facility],  [address] [place], [place] [address]    Report Status PENDING  Incomplete  Aerobic/Anaerobic Culture w Gram Stain (surgical/deep wound)     Status: None (Preliminary result)   Collection Time: 05/02/23  1:00 PM   Specimen: PATH Amputaion Arm/Leg; Tissue  Result Value Ref Range Status   Specimen Description   Final    ABSCESS Performed at [facility], [address] [place], [place] [address]    Special Requests   Final    AMPUTATION L LOWER EXTREMITY Performed at [facility], [address] [place], [place] [address]    Gram Stain   Final    FEW WBC PRESENT, PREDOMINANTLY PMN RARE GRAM POSITIVE COCCI    Culture   Final    NO GROWTH < 12 HOURS Performed at [facility], [address], [place], [place] [address]    Report Status PENDING  Incomplete     Labs: BNP (last 3 results) No results for input(s): "BNP" in the last 8760 hours. Basic Metabolic Panel: Recent Labs  Lab 04/29/23 1923 04/30/23 0540 05/01/23 0813 05/02/23 0349 05/03/23 0851  NA 133* 136 140 139 139  K 2.7* 2.8* 3.5 3.1* 3.6  CL 102 109 113* 111 110  CO2 21* 20* 19* 20* 21*  GLUCOSE 124* 161* 94 164* 237*  BUN 18 16 17 16 18   CREATININE 1.83* 1.54* 1.76* 1.73* 1.58*  CALCIUM 8.5* 7.6* 7.3* 8.4* 8.3*  MG 2.2  --   --   --   --    Liver Function Tests: Recent Labs  Lab 04/29/23 1923  AST 19  ALT 12  ALKPHOS 109  BILITOT 1.4*  PROT 7.3  ALBUMIN 3.3*   No results for input(s): "LIPASE", "AMYLASE" in the last 168 hours. No results for input(s): "AMMONIA" in the last 168 hours. CBC: Recent Labs  Lab 04/29/23 1923 04/30/23 0540  05/02/23 0349 05/03/23 0851  WBC 16.6* 12.6* 8.4 6.7  NEUTROABS 13.6*  --   --   --   HGB 12.3* 10.5* 10.0* 10.1*  HCT 36.8* 31.2* 30.0* 31.2*  MCV 83.6 84.1 85.0 86.4  PLT 224 176 214 195   Cardiac Enzymes: No results for input(s): "CKTOTAL", "CKMB", "CKMBINDEX", "TROPONINI" in the last 168 hours. BNP: Invalid input(s):  "POCBNP" CBG: Recent Labs  Lab 04/30/23 0231 05/02/23 1136 05/02/23 1329  GLUCAP 195* 124* 152*   D-Dimer No results for input(s): "DDIMER" in the last 72 hours. Hgb A1c No results for input(s): "HGBA1C" in the last 72 hours. Lipid Profile No results for input(s): "CHOL", "HDL", "LDLCALC", "TRIG", "CHOLHDL", "LDLDIRECT" in the last 72 hours. Thyroid function studies No results for input(s): "TSH", "T4TOTAL", "T3FREE", "THYROIDAB" in the last 72 hours.  Invalid input(s): "FREET3" Anemia work up No results for input(s): "VITAMINB12", "FOLATE", "FERRITIN", "TIBC", "IRON", "RETICCTPCT" in the last 72 hours. Urinalysis    Component Value Date/Time   COLORURINE STRAW (A) 04/30/2023 0540   APPEARANCEUR CLEAR (A) 04/30/2023 0540   LABSPEC 1.015 04/30/2023 0540   PHURINE 6.0 04/30/2023 0540   GLUCOSEU 150 (A) 04/30/2023 0540   HGBUR SMALL (A) 04/30/2023 0540   BILIRUBINUR NEGATIVE 04/30/2023 0540   KETONESUR NEGATIVE 04/30/2023 0540   PROTEINUR 100 (A) 04/30/2023 0540   UROBILINOGEN 0.2 07/31/2011 2217   NITRITE NEGATIVE 04/30/2023 0540   LEUKOCYTESUR NEGATIVE 04/30/2023 0540   Sepsis Labs Recent Labs  Lab 04/29/23 1923 04/30/23 0540 05/02/23 0349 05/03/23 0851  WBC 16.6* 12.6* 8.4 6.7   Microbiology Recent Results (from the past 240 hour(s))  Culture, blood (routine x 2)     Status: None (Preliminary result)   Collection Time: 04/29/23  9:39 PM   Specimen: BLOOD  Result Value Ref Range Status   Specimen Description BLOOD RIGHT ARM  Final   Special Requests   Final    BOTTLES DRAWN AEROBIC AND ANAEROBIC Blood Culture adequate volume   Culture   Final    NO GROWTH 4 DAYS Performed at [facility], [address] [place], [place] [address]    Report Status PENDING  Incomplete  Culture, blood (routine x 2)     Status: None (Preliminary result)   Collection Time: 04/29/23  9:43 PM   Specimen: BLOOD  Result Value Ref Range Status   Specimen Description  BLOOD LEFT ARM  Final   Special Requests   Final    BOTTLES DRAWN AEROBIC AND ANAEROBIC Blood Culture adequate volume   Culture   Final    NO GROWTH 4 DAYS Performed at [facility], [address] [place], [place] [address]    Report Status PENDING  Incomplete  Aerobic/Anaerobic Culture w Gram Stain (surgical/deep wound)     Status: None (Preliminary result)   Collection Time: 05/02/23  1:00 PM   Specimen: PATH Amputaion Arm/Leg; Tissue  Result Value Ref Range Status   Specimen Description   Final    ABSCESS Performed at [facility], [address] [place], [place] [address]    Special Requests   Final    AMPUTATION L LOWER EXTREMITY Performed at [facility], [address] [place], [place] [address]    Gram Stain   Final    FEW WBC PRESENT, PREDOMINANTLY PMN RARE GRAM POSITIVE COCCI    Culture   Final    NO GROWTH < 12 HOURS Performed at [facility], [address] [place],  [place] [address]    Report Status PENDING  Incomplete   Imaging MR KNEE LEFT W WO CONTRAST  Result Date: 04/30/2023 CLINICAL DATA:  Left below-the-knee amputation. Leg infection. Fever and chills. Redness to knee wound. EXAM: MR OF THE LEFT LOWER EXTREMITY WITHOUT AND WITH CONTRAST; MRI OF THE LEFT KNEE WITHOUT AND WITH CONTRAST TECHNIQUE: Multiplanar, multisequence MR imaging of the left femur was performed both before and after administration of intravenous contrast. Multiplanar, multisequence MR imaging of the left knee was performed both before and after administration of intravenous contrast. CONTRAST:  9mL GADAVIST GADOBUTROL 1 MMOL/ML IV SOLN COMPARISON:  left knee radiographs 04/29/2023, 11/25/2022, 07/12/2022 FINDINGS: Left femur: Bones/Joint/Cartilage Normal marrow signal within the left femur. The cortices are intact. No marrow edema. Enlarged of the images there is moderate bilateral femoroacetabular cartilage thinning and femoral head-neck junction  degenerative osteophytes. There is a 13 mm decreased T1 decreased T2 signal sclerotic focus within the distal medial right femoral neck junction with the intertrochanteric region, compatible with a benign bone island. Overlying cortex is intact (coronal series 7, image 18). Muscles and Tendons Normal signal and size of the left thigh musculature. Soft tissues There is moderate subcutaneous fat edema and swelling within the distal 50% of the medial left thigh. At the level of the medial femoral condyle there is an oval predominantly decreased T1 increased T2 signal walled-off collection with internal septations and peripheral wall enhancement, measuring up to approximately 1.8 x 2.3 x 2.9 cm (transverse by AP by craniocaudal (axial series 17, image 84 and coronal series 11 image 16). This is suspicious for an abscess, superficial to the medial collateral ligament. There is mild coalescing fluid within the subcutaneous fat around this walled-off abscess. There are at least 5 enlarged left inguinal lymph nodes, measuring up to 1.9 x 2.6 cm in transverse dimensions (axial series 17, image 25). These are presumably reactive. -- Left knee: MENISCI Medial meniscus:  Intact. Lateral meniscus:  Intact. LIGAMENTS Cruciates: The ACL and PCL are intact. Collaterals: The medial collateral ligament is intact. The fibular collateral ligament, biceps femoris tendon, iliotibial band, and popliteus tendon are intact. CARTILAGE Patellofemoral:  Intact. Medial:  Intact. Lateral:  Intact. Joint: Nojoint effusion. Normal Hoffa's fat pad. No plical thickening. Extensor Mechanism: Mild chronic enthesopathic change at the quadriceps insertion on the patella. The quadriceps tendon insertion is intact. Moderate chronic enthesopathic change at the patellar tendon origin. Moderate intermediate T2 signal proximal patellar tendinosis. Moderate chronic enthesopathic change at the patellar tendon insertion on the tibial tubercle with moderate  intermediate T2 signal and thickening insertional tendinosis. Bones: Postsurgical changes are seen of amputation at the level of the proximal tibial diaphysis. The cortices are intact. No acute fracture. Soft tissues: There is moderate edema and swelling of the medial knee subcutaneous fat, contiguous with the distal thigh described above. Rim enhancing abscess within the subcutaneous fat medial to the medial femoral condyle as described above. There is metallic susceptibility artifact within the distal left lower extremity amputation soft tissues consistent with postsurgical change. There is mild-to-moderate edema and swelling of the subcutaneous fat at the distal amputation stump, tracking along the distal lateral subcutaneous fat greater than distal medial subcutaneous fat (coronal series 13 images 4 through 19 and sagittal series 16 images 12 through 37). This subcutaneous fat edema extends to the level of the distal tibial bone amputation site, however no definite edema or enhancement is seen within the bone marrow to indicate acute osteomyelitis. IMPRESSION: 1. There is moderate  subcutaneous fat edema and swelling within the distal 50% of the medial left thigh and within the medial left knee. Within this subcutaneous fat swelling there is a walled-off abscess medial to the left medial femoral condyle measuring up to 2.9 cm in greatest dimension. 2. There is mild-to-moderate subcutaneous fat edema and swelling at the distal left tibial amputation stump, tracking along the distal lateral greater than distal medial subcutaneous fat. This subcutaneous fat edema extends to the level of the distal tibial bone amputation site, however no definite edema or enhancement is seen within the bone marrow to indicate acute osteomyelitis at this time. 3. There are at least 5 enlarged left inguinal lymph nodes, measuring up to 1.9 x 2.6 cm in transverse dimensions. These are presumably reactive. Electronically Signed   By:  Neita Garnet M.D.   On: 04/30/2023 09:49   MR FEMUR LEFT W WO CONTRAST  Result Date: 04/30/2023 CLINICAL DATA:  Left below-the-knee amputation. Leg infection. Fever and chills. Redness to knee wound. EXAM: MR OF THE LEFT LOWER EXTREMITY WITHOUT AND WITH CONTRAST; MRI OF THE LEFT KNEE WITHOUT AND WITH CONTRAST TECHNIQUE: Multiplanar, multisequence MR imaging of the left femur was performed both before and after administration of intravenous contrast. Multiplanar, multisequence MR imaging of the left knee was performed both before and after administration of intravenous contrast. CONTRAST:  9mL GADAVIST GADOBUTROL 1 MMOL/ML IV SOLN COMPARISON:  left knee radiographs 04/29/2023, 11/25/2022, 07/12/2022 FINDINGS: Left femur: Bones/Joint/Cartilage Normal marrow signal within the left femur. The cortices are intact. No marrow edema. Enlarged of the images there is moderate bilateral femoroacetabular cartilage thinning and femoral head-neck junction degenerative osteophytes. There is a 13 mm decreased T1 decreased T2 signal sclerotic focus within the distal medial right femoral neck junction with the intertrochanteric region, compatible with a benign bone island. Overlying cortex is intact (coronal series 7, image 18). Muscles and Tendons Normal signal and size of the left thigh musculature. Soft tissues There is moderate subcutaneous fat edema and swelling within the distal 50% of the medial left thigh. At the level of the medial femoral condyle there is an oval predominantly decreased T1 increased T2 signal walled-off collection with internal septations and peripheral wall enhancement, measuring up to approximately 1.8 x 2.3 x 2.9 cm (transverse by AP by craniocaudal (axial series 17, image 84 and coronal series 11 image 16). This is suspicious for an abscess, superficial to the medial collateral ligament. There is mild coalescing fluid within the subcutaneous fat around this walled-off abscess. There are at least 5  enlarged left inguinal lymph nodes, measuring up to 1.9 x 2.6 cm in transverse dimensions (axial series 17, image 25). These are presumably reactive. -- Left knee: MENISCI Medial meniscus:  Intact. Lateral meniscus:  Intact. LIGAMENTS Cruciates: The ACL and PCL are intact. Collaterals: The medial collateral ligament is intact. The fibular collateral ligament, biceps femoris tendon, iliotibial band, and popliteus tendon are intact. CARTILAGE Patellofemoral:  Intact. Medial:  Intact. Lateral:  Intact. Joint: Nojoint effusion. Normal Hoffa's fat pad. No plical thickening. Extensor Mechanism: Mild chronic enthesopathic change at the quadriceps insertion on the patella. The quadriceps tendon insertion is intact. Moderate chronic enthesopathic change at the patellar tendon origin. Moderate intermediate T2 signal proximal patellar tendinosis. Moderate chronic enthesopathic change at the patellar tendon insertion on the tibial tubercle with moderate intermediate T2 signal and thickening insertional tendinosis. Bones: Postsurgical changes are seen of amputation at the level of the proximal tibial diaphysis. The cortices are intact. No acute fracture.  Soft tissues: There is moderate edema and swelling of the medial knee subcutaneous fat, contiguous with the distal thigh described above. Rim enhancing abscess within the subcutaneous fat medial to the medial femoral condyle as described above. There is metallic susceptibility artifact within the distal left lower extremity amputation soft tissues consistent with postsurgical change. There is mild-to-moderate edema and swelling of the subcutaneous fat at the distal amputation stump, tracking along the distal lateral subcutaneous fat greater than distal medial subcutaneous fat (coronal series 13 images 4 through 19 and sagittal series 16 images 12 through 37). This subcutaneous fat edema extends to the level of the distal tibial bone amputation site, however no definite edema or  enhancement is seen within the bone marrow to indicate acute osteomyelitis. IMPRESSION: 1. There is moderate subcutaneous fat edema and swelling within the distal 50% of the medial left thigh and within the medial left knee. Within this subcutaneous fat swelling there is a walled-off abscess medial to the left medial femoral condyle measuring up to 2.9 cm in greatest dimension. 2. There is mild-to-moderate subcutaneous fat edema and swelling at the distal left tibial amputation stump, tracking along the distal lateral greater than distal medial subcutaneous fat. This subcutaneous fat edema extends to the level of the distal tibial bone amputation site, however no definite edema or enhancement is seen within the bone marrow to indicate acute osteomyelitis at this time. 3. There are at least 5 enlarged left inguinal lymph nodes, measuring up to 1.9 x 2.6 cm in transverse dimensions. These are presumably reactive. Electronically Signed   By: Neita Garnet M.D.   On: 04/30/2023 09:49   DG Knee Complete 4 Views Left  Result Date: 04/29/2023 CLINICAL DATA:  Pressure wound, cellulitis. EXAM: LEFT KNEE - COMPLETE 4+ VIEW COMPARISON:  Knee radiographs 11/25/2022. FINDINGS: The patient is status post below-knee amputation. There is soft tissue irregularity overlying the distal aspect of the stump which may correspond to the reported wound, with surrounding swelling. There is no osseous erosion or destruction in the underlying bone. Knee alignment is maintained. The joint spaces are preserved. IMPRESSION: Status post below-knee amputation with soft tissue irregularity overlying the distal aspect of the stump which may correspond to the reported wound, with surrounding swelling. No erosion or destruction of the underlying bone. Electronically Signed   By: Lesia Hausen M.D.   On: 04/29/2023 21:37      Time coordinating discharge: over 30 minutes  SIGNED:  Sunnie Nielsen DO [facility]

## 2023-05-03 NOTE — TOC Transition Note (Signed)
Transition of Care [facility]) - CM/SW Discharge Note   Patient Details  Name: Richard Mendoza MRN: [id number] Date of Birth: 02-28-1976  Transition of Care [facility]) CM/SW Contact:  Garret Reddish, RN Phone Number: 05/03/2023, 2:00 PM   Clinical Narrative:   Chart reviewed. Patient is  POD#1 from incision and drainage of infected abscess in left lower extremity medial knee area.    Noted that patient has orders for discharge today.  Patient has requested DME: wheelchair and a rolling walker.  I have informed patient that insurance will only cover one or the other.  Patient has chosen to have DME agency provide the wheelchair.  Patient will purchase the rolling walker on his own.    I have asked Barbara Cower with [employer] to provide patient a wheelchair.    Patient has not other discharge needs.  I have made staff nurse aware.      Final next level of care:  (Home) Barriers to Discharge: No Barriers Identified   Patient Goals and CMS Choice CMS Medicare.gov Compare Post Acute Care list provided to:: Patient Choice offered to / list presented to : Patient  Discharge Placement                      Patient and family notified of of transfer: 05/03/23  Discharge Plan and Services Additional resources added to the After Visit Summary for     Discharge Planning Services: CM Consult Post Acute Care Choice: Home Health (Pending Medical workup)          DME Arranged: Wheelchair manual DME Agency: [employer] Date DME Agency Contacted: 05/03/23 Time DME Agency Contacted: 1330 Representative spoke with at DME Agency: Barbara Cower            Social Determinants of Health (SDOH) Interventions SDOH Screenings   Food Insecurity: No Food Insecurity (04/30/2023)  Housing: Low Risk  (04/30/2023)  Transportation Needs: No Transportation Needs (04/30/2023)  Utilities: Not At Risk (04/30/2023)  Depression (PHQ2-9): Low Risk  (09/21/2019)  Tobacco Use: Medium Risk (05/03/2023)     Readmission Risk  Interventions     No data to display

## 2023-05-03 NOTE — Progress Notes (Signed)
PT Cancellation Note  Patient Details Name: Richard Mendoza MRN: [id number] DOB: July 11, 1976   Cancelled Treatment:    Reason Eval/Treat Not Completed: PT screened, no needs identified, will sign off Per patient, he is independent with RW and states "I never had PT to teach me how to use my leg and I don't need it now." Patient inquiring about w/c and RW per discussion with OT and states "I'll find another doctor to get me one." Patient has been up with nursing staff to/from bathroom with RW. Patient declines acute PT services. PT will sign off.   Maylon Peppers, PT, DPT Physical Therapist - [facility]  [facility]   Ajiah Mcglinn A Jock Mahon 05/03/2023, 9:38 AM

## 2023-05-03 NOTE — Progress Notes (Signed)
Patient will require a wheelchair due to Patient suffers from abscess LLE and s/p BKA LLE which impairs their ability to perform daily activities like bathing, dressing, and toileting in the home.  A cane, crutch, or walker will not resolve issue with performing activities of daily living. A wheelchair will allow patient to safely perform daily activities. Patient can safely propel the wheelchair in the home or has a caregiver who can provide assistance. Length of need 6 months .  Accessories: elevating leg rests (ELRs), wheel locks, extensions and anti-tippers.

## 2023-05-03 NOTE — Progress Notes (Signed)
  Progress Note    05/03/2023 9:42 AM 1 Day Post-Op  Subjective:  Richard Mendoza is a 47 y.o. male who is now POD#1 from incision and drainage of infected abscess in left lower extremity medial knee area.  On exam this morning patient is resting comfortably.  No complaints overnight.  Patient does endorse soreness to his left which is expected.  Patient appears to be recovering as expected.  Vitals all remained stable.   Vitals:   05/03/23 0448 05/03/23 0926  BP: (!) 171/97 (!) 182/92  Pulse: 75 71  Resp: 18 18  Temp: 97.8 F (36.6 C) 97.8 F (36.6 C)  SpO2: 100% 100%   Physical Exam: Cardiac:  RRR, normal S1, S2.  No rubs clicks or gallops or murmurs. Lungs: Clear on auscultation throughout.  Normal respiratory effort. Incisions: Left lower extremity incision from surgery yesterday.  Dressing clean dry and intact with packing.  No complications to note Extremities: History of right BKA.  Left lower extremity warm to touch with palpable pulses.  Incision to left knee area due to abscess.  Now incised and drained. Abdomen: Positive bowel sounds throughout, soft, flat, nontender and nondistended. Neurologic: Alert and oriented x 3 follows all commands appropriately.  Answers all questions appropriately.  CBC    Component Value Date/Time   WBC 6.7 05/03/2023 0851   RBC 3.61 (L) 05/03/2023 0851   HGB 10.1 (L) 05/03/2023 0851   HCT 31.2 (L) 05/03/2023 0851   PLT 195 05/03/2023 0851   MCV 86.4 05/03/2023 0851   MCH 28.0 05/03/2023 0851   MCHC 32.4 05/03/2023 0851   RDW 14.8 05/03/2023 0851   LYMPHSABS 1.9 04/29/2023 1923   MONOABS 0.9 04/29/2023 1923   EOSABS 0.1 04/29/2023 1923   BASOSABS 0.1 04/29/2023 1923    BMET    Component Value Date/Time   NA 139 05/03/2023 0851   K 3.6 05/03/2023 0851   CL 110 05/03/2023 0851   CO2 21 (L) 05/03/2023 0851   GLUCOSE 237 (H) 05/03/2023 0851   BUN 18 05/03/2023 0851   CREATININE 1.58 (H) 05/03/2023 0851   CALCIUM 8.3 (L)  05/03/2023 0851   GFRNONAA 54 (L) 05/03/2023 0851   GFRAA >60 05/05/2020 1930    INR    Component Value Date/Time   INR 1.1 09/06/2021 1708     Intake/Output Summary (Last 24 hours) at 05/03/2023 0942 Last data filed at 05/03/2023 0527 Gross per 24 hour  Intake 1200 ml  Output 1102 ml  Net 98 ml     Assessment/Plan:  47 y.o. male is s/p incision and drainage of abscess of left knee Infection.  1 Day Post-Op   PLAN: Dressing changes daily.  1 inch iodoform packing to the wound covered with gauze secured with Tegaderm. Continue antibiotics as prescribed by the medical team. Okay to discharge home with primary team is in agreement.  DVT prophylaxis: Heparin 5000 units subcu every 8 hours   Marcie Bal [facility] 05/03/2023 9:42 AM

## 2023-05-03 NOTE — Plan of Care (Signed)
  Problem: Education: Goal: Knowledge of General Education information will improve Description: Including pain rating scale, medication(s)/side effects and non-pharmacologic comfort measures Outcome: Progressing   Problem: Health Behavior/Discharge Planning: Goal: Ability to manage health-related needs will improve Outcome: Progressing   Problem: Clinical Measurements: Goal: Diagnostic test results will improve Outcome: Progressing   Problem: Activity: Goal: Risk for activity intolerance will decrease Outcome: Progressing   Problem: Nutrition: Goal: Adequate nutrition will be maintained Outcome: Progressing   Problem: Elimination: Goal: Will not experience complications related to bowel motility Outcome: Progressing Goal: Will not experience complications related to urinary retention Outcome: Progressing   Problem: Pain Managment: Goal: General experience of comfort will improve Outcome: Progressing   

## 2023-05-04 LAB — AEROBIC/ANAEROBIC CULTURE W GRAM STAIN (SURGICAL/DEEP WOUND)

## 2023-05-04 LAB — CULTURE, BLOOD (ROUTINE X 2)
Culture: NO GROWTH
Culture: NO GROWTH
Special Requests: ADEQUATE

## 2023-05-05 LAB — AEROBIC/ANAEROBIC CULTURE W GRAM STAIN (SURGICAL/DEEP WOUND)

## 2023-05-07 LAB — AEROBIC/ANAEROBIC CULTURE W GRAM STAIN (SURGICAL/DEEP WOUND)

## 2023-05-14 ENCOUNTER — Ambulatory Visit (INDEPENDENT_AMBULATORY_CARE_PROVIDER_SITE_OTHER): Payer: Medicaid Other | Admitting: Nurse Practitioner

## 2023-05-14 ENCOUNTER — Encounter (INDEPENDENT_AMBULATORY_CARE_PROVIDER_SITE_OTHER): Payer: Self-pay | Admitting: Nurse Practitioner

## 2023-05-14 VITALS — BP 171/120 | HR 94 | Resp 18 | Ht 70.0 in | Wt 205.0 lb

## 2023-05-14 DIAGNOSIS — Z89512 Acquired absence of left leg below knee: Secondary | ICD-10-CM

## 2023-05-14 DIAGNOSIS — T879 Unspecified complications of amputation stump: Secondary | ICD-10-CM

## 2023-05-14 NOTE — Progress Notes (Signed)
Subjective:    Patient ID: Richard Mendoza, male    DOB: December 06, 1975, 47 y.o.   MRN: [id number] Chief Complaint  Patient presents with   Follow-up    f/u in1 week with no studies    Richard Mendoza is a 47 year old male who returns today for wound evaluation.  He was recently in the emergency room where he had an abscess located on his medial portion of his left knee.  It was felt that this was because due to his prosthetic and issues with how it fits.  The patient underwent a debridement and has been doing well since that time.  He has been able to change his wound dressings.  Today is clean dry and intact with no foul-smelling drainage.    Review of Systems  Musculoskeletal:  Positive for gait problem.  Skin:  Positive for wound.  All other systems reviewed and are negative.      Objective:   Physical Exam Vitals reviewed.  HENT:     Head: Normocephalic.  Cardiovascular:     Rate and Rhythm: Normal rate.  Pulmonary:     Effort: Pulmonary effort is normal.  Musculoskeletal:     Left Lower Extremity: Left leg is amputated below knee.  Skin:    General: Skin is warm and dry.  Neurological:     Mental Status: He is alert and oriented to person, place, and time.  Psychiatric:        Mood and Affect: Mood normal.        Behavior: Behavior normal.        Thought Content: Thought content normal.        Judgment: Judgment normal.     BP (!) 171/120 (BP Location: Right Arm)   Pulse 94   Resp 18   Ht 5\' 10"  (1.778 m)   Wt 205 lb (93 kg)   BMI 29.41 kg/m   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    Social History   Socioeconomic History   Marital status: Married    Spouse name: Not on file   Number of children: 4   Years of education: Not on file   Highest education level: Not on file  Occupational History   Not on  file  Tobacco Use   Smoking status: Former    Packs/day: 1.00    Years: 17.00    Additional pack years: 0.00    Total pack years: 17.00    Types: Cigarettes    Quit date: 01/14/2019    Years since quitting: 4.3   Smokeless tobacco: Never  Vaping Use   Vaping Use: Never used  Substance and Sexual Activity   Alcohol use: Yes    Comment: rare   Drug use: No   Sexual activity: Yes  Other Topics Concern   Not on file  Social History Narrative   Not on file   Social Determinants of Health   Financial Resource Strain: Not on file  Food Insecurity: No Food Insecurity (04/30/2023)   Hunger Vital Sign    Worried About Running Out of Food in the Last Year: Never true    Ran Out of Food in the Last Year: Never true  Transportation Needs: No Transportation Needs (04/30/2023)   PRAPARE - Administrator, Civil Service (Medical): No    Lack of Transportation (Non-Medical):  No  Physical Activity: Not on file  Stress: Not on file  Social Connections: Not on file  Intimate Partner Violence: Not At Risk (04/30/2023)   Humiliation, Afraid, Rape, and Kick questionnaire    Fear of Current or Ex-Partner: No    Emotionally Abused: No    Physically Abused: No    Sexually Abused: No    Past Surgical History:  Procedure Laterality Date   AMPUTATION Left 11/07/2020   Procedure: 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;  Surgeon: Linus Galas, DPM;  Location: [facility];  Service: Podiatry;  Laterality: Left;   AMPUTATION Left 11/16/2020   Procedure: AMPUTATION BELOW KNEE;  Surgeon: Annice Needy, MD;  Location: [facility];  Service: Vascular;  Laterality: Left;   ANTERIOR CERVICAL DECOMP/DISCECTOMY FUSION N/A 09/09/2017   Procedure: ANTERIOR CERVICAL DECOMPRESSION/DISCECTOMY FUSION CERVICAL 6- CERVICAL 7;  Surgeon: Coletta Memos, MD;  Location: [facility];  Service: Neurosurgery;  Laterality: N/A;  ANTERIOR CERVICAL DECOMPRESSION/DISCECTOMY FUSION CERVICAL 6- CERVICAL 7   APPENDECTOMY      I & D EXTREMITY Right 10/03/2017   Procedure: IRRIGATION AND DEBRIDEMENT RIGHT WRIST;  Surgeon: Betha Loa, MD;  Location: [facility];  Service: Orthopedics;  Laterality: Right;   I & D EXTREMITY Right 11/26/2018   Procedure: IRRIGATION AND DEBRIDEMENT FASCIA ON RIGHT FOOT;  Surgeon: Gwyneth Revels, DPM;  Location: [facility];  Service: Podiatry;  Laterality: Right;   INCISION AND DRAINAGE Right 03/06/2019   Procedure: INCISION AND DRAINAGE RIGHT FOOT, WITH 4th RAY AMPUTATION;  Surgeon: Gwyneth Revels, DPM;  Location: [facility];  Service: Podiatry;  Laterality: Right;   INCISION AND DRAINAGE Left 11/07/2020   Procedure: INCISION AND DRAINAGE;  Surgeon: Linus Galas, DPM;  Location: [facility];  Service: Podiatry;  Laterality: Left;   INCISION AND DRAINAGE ABSCESS Left 05/02/2023   Procedure: INCISION AND DRAINAGE ABSCESS LEFT LOWER EXTREMITY;  Surgeon: Annice Needy, MD;  Location: [facility];  Service: Vascular;  Laterality: Left;   IRRIGATION AND DEBRIDEMENT FOOT Left 11/11/2020   Procedure: IRRIGATION AND DEBRIDEMENT FOOT;  Surgeon: Linus Galas, DPM;  Location: [facility];  Service: Podiatry;  Laterality: Left;   METATARSAL HEAD EXCISION Right 05/15/2019   Procedure: OSTECTOMY;MET HEAD 5;  Surgeon: Gwyneth Revels, DPM;  Location: [facility];  Service: Podiatry;  Laterality: Right;   osteomylitis     ROTATOR CUFF REPAIR Left     Family History  Problem Relation Age of Onset   Diabetes Mother    Heart disease Father    Diabetes Father    Arthritis Other    Hyperlipidemia Other    Hypertension Other    Cancer Other        breast   Mental illness Neg Hx     No Known Allergies     Latest Ref Rng & Units 05/03/2023    8:51 AM 05/02/2023    3:49 AM 04/30/2023    5:40 AM  CBC  WBC 4.0 - 10.5 K/uL 6.7  8.4  12.6   Hemoglobin 13.0 - 17.0 g/dL 16.1  09.6  04.5   Hematocrit 39.0 - 52.0 % 31.2  30.0  31.2   Platelets 150 - 400 K/uL 195  214  176       CMP     Component Value Date/Time   NA 139  05/03/2023 0851   K 3.6 05/03/2023 0851   CL 110 05/03/2023 0851   CO2 21 (L) 05/03/2023 0851   GLUCOSE 237 (H) 05/03/2023 4098  BUN 18 05/03/2023 0851   CREATININE 1.58 (H) 05/03/2023 0851   CALCIUM 8.3 (L) 05/03/2023 0851   PROT 7.3 04/29/2023 1923   ALBUMIN 3.3 (L) 04/29/2023 1923   AST 19 04/29/2023 1923   ALT 12 04/29/2023 1923   ALKPHOS 109 04/29/2023 1923   BILITOT 1.4 (H) 04/29/2023 1923   GFR 118.24 10/24/2011 1654   GFRNONAA 54 (L) 05/03/2023 0851     No results found.     Assessment & Plan:   1. BKA stump complication (HCC) The patient's wound is doing well.  The tunneling area has greatly decreased.  I suspect that in 2 weeks we will be able to stop packing and change to a different wound care modality which will help with wound healing.  2. S/P BKA (below knee amputation), left (HCC) Currently the patient's socket no longer is fitting because of anatomical changes in the patient's foot is worn out beyond repair.  He will require update to prevent recurrent wound.   Current Outpatient Medications on File Prior to Visit  Medication Sig Dispense Refill   atorvastatin (LIPITOR) 40 MG tablet Take 40 mg by mouth daily.  3   benazepril (LOTENSIN) 20 MG tablet Take 20 mg by mouth daily.     [facility] SENNA 8.6 MG tablet Take 1 tablet by mouth 2 (two) times daily as needed for constipation.  5   FIASP 100 UNIT/ML SOLN Inject into the skin continuous. Via pump     HYDROcodone-acetaminophen (NORCO) 10-325 MG tablet Take 1 tablet by mouth every 6 (six) hours as needed for moderate pain or severe pain. 20 tablet 0   No current facility-administered medications on file prior to visit.    There are no Patient Instructions on file for this visit. No follow-ups on file.   Georgiana Spinner, NP

## 2023-05-28 ENCOUNTER — Encounter (INDEPENDENT_AMBULATORY_CARE_PROVIDER_SITE_OTHER): Payer: Self-pay | Admitting: Nurse Practitioner

## 2023-05-28 ENCOUNTER — Ambulatory Visit (INDEPENDENT_AMBULATORY_CARE_PROVIDER_SITE_OTHER): Payer: Medicaid Other | Admitting: Nurse Practitioner

## 2023-05-28 VITALS — BP 175/110 | HR 96 | Resp 18 | Ht 70.0 in | Wt 194.4 lb

## 2023-05-28 DIAGNOSIS — T879 Unspecified complications of amputation stump: Secondary | ICD-10-CM

## 2023-05-28 NOTE — Progress Notes (Signed)
Subjective:    Patient ID: Richard Mendoza, male    DOB: [birth year], 47 y.o.   MRN: [id number] Chief Complaint  Patient presents with   Follow-up    2 weeks no studies    Richard Mendoza is a 47 year old male who returns today for wound evaluation.  He was recently in the emergency room where he had an abscess located on his medial portion of his left knee.  It was felt that this was because due to his prosthetic and issues with how it fits.  The patient underwent a debridement and has been doing well since that time.  He has been able to change his wound dressings.  Today is clean dry and intact with no foul-smelling drainage.  Since his last visit the patient had tunneling which is resolved.  He still has an open wound area but it has good granulation tissue.    Review of Systems  Musculoskeletal:  Positive for gait problem.  Skin:  Positive for wound.  All other systems reviewed and are negative.      Objective:   Physical Exam Vitals reviewed.  HENT:     Head: Normocephalic.  Cardiovascular:     Rate and Rhythm: Normal rate.  Pulmonary:     Effort: Pulmonary effort is normal.  Musculoskeletal:     Left Lower Extremity: Left leg is amputated below knee.  Skin:    General: Skin is warm and dry.  Neurological:     Mental Status: He is alert and oriented to person, place, and time.  Psychiatric:        Mood and Affect: Mood normal.        Behavior: Behavior normal.        Thought Content: Thought content normal.        Judgment: Judgment normal.     BP (!) 175/110 (BP Location: Right Arm)   Pulse 96   Resp 18   Ht 5\' 10"  (1.778 m)   Wt 194 lb 6.4 oz (88.2 kg)   BMI 27.89 kg/m   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    Social History   Socioeconomic History   Marital status: Married    Spouse name: Not on file    Number of children: 4   Years of education: Not on file   Highest education level: Not on file  Occupational History   Not on file  Tobacco Use   Smoking status: Former    Packs/day: 1.00    Years: 17.00    Additional pack years: 0.00    Total pack years: 17.00    Types: Cigarettes    Quit date: 01/14/2019    Years since quitting: 4.3   Smokeless tobacco: Never  Vaping Use   Vaping Use: Never used  Substance and Sexual Activity   Alcohol use: Yes    Comment: rare   Drug use: No   Sexual activity: Yes  Other Topics Concern   Not on file  Social History Narrative   Not on file   Social Determinants of Health   Financial Resource Strain: Not on file  Food Insecurity: No Food Insecurity (04/30/2023)   Hunger Vital Sign    Worried About Running Out of Food in the Last Year: Never true    Ran Out of Food in the Last Year: Never true  Transportation Needs: No Transportation Needs (04/30/2023)   PRAPARE - Administrator, Civil Service (Medical): No    Lack of Transportation (Non-Medical): No  Physical Activity: Not on file  Stress: Not on file  Social Connections: Not on file  Intimate Partner Violence: Not At Risk (04/30/2023)   Humiliation, Afraid, Rape, and Kick questionnaire    Fear of Current or Ex-Partner: No    Emotionally Abused: No    Physically Abused: No    Sexually Abused: No    Past Surgical History:  Procedure Laterality Date   AMPUTATION Left 11/07/2020   Procedure: 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;  Surgeon: Linus Galas, DPM;  Location: [facility];  Service: Podiatry;  Laterality: Left;   AMPUTATION Left 11/16/2020   Procedure: AMPUTATION BELOW KNEE;  Surgeon: Annice Needy, MD;  Location: [facility];  Service: Vascular;  Laterality: Left;   ANTERIOR CERVICAL DECOMP/DISCECTOMY FUSION N/A 09/09/2017   Procedure: ANTERIOR CERVICAL DECOMPRESSION/DISCECTOMY FUSION CERVICAL 6- CERVICAL 7;  Surgeon: Coletta Memos, MD;  Location: [facility];  Service: Neurosurgery;  Laterality: N/A;  ANTERIOR CERVICAL DECOMPRESSION/DISCECTOMY FUSION CERVICAL 6- CERVICAL 7   APPENDECTOMY     I & D EXTREMITY Right 10/03/2017   Procedure: IRRIGATION AND DEBRIDEMENT RIGHT WRIST;  Surgeon: Betha Loa, MD;  Location: [facility];  Service: Orthopedics;  Laterality: Right;   I & D EXTREMITY Right 11/26/2018   Procedure: IRRIGATION AND DEBRIDEMENT FASCIA ON RIGHT FOOT;  Surgeon: Gwyneth Revels, DPM;  Location: [facility];  Service: Podiatry;  Laterality: Right;   INCISION AND DRAINAGE Right 03/06/2019   Procedure: INCISION AND DRAINAGE RIGHT FOOT, WITH 4th RAY AMPUTATION;  Surgeon: Gwyneth Revels, DPM;  Location: [facility];  Service: Podiatry;  Laterality: Right;   INCISION AND DRAINAGE Left 11/07/2020   Procedure: INCISION AND DRAINAGE;  Surgeon: Linus Galas, DPM;  Location: [facility];  Service: Podiatry;  Laterality: Left;   INCISION AND DRAINAGE ABSCESS Left 05/02/2023   Procedure: INCISION AND DRAINAGE ABSCESS LEFT LOWER EXTREMITY;  Surgeon: Annice Needy, MD;  Location: [facility];  Service: Vascular;  Laterality: Left;   IRRIGATION AND DEBRIDEMENT FOOT Left 11/11/2020   Procedure: IRRIGATION AND DEBRIDEMENT FOOT;  Surgeon: Linus Galas, DPM;  Location: [facility];  Service: Podiatry;  Laterality: Left;   METATARSAL HEAD EXCISION Right 05/15/2019   Procedure: OSTECTOMY;MET HEAD 5;  Surgeon: Gwyneth Revels, DPM;  Location: [facility];  Service: Podiatry;  Laterality: Right;   osteomylitis     ROTATOR CUFF REPAIR Left     Family History  Problem Relation Age of Onset   Diabetes Mother    Heart disease Father    Diabetes Father    Arthritis Other    Hyperlipidemia Other    Hypertension Other    Cancer Other        breast   Mental illness Neg Hx     No Known Allergies     Latest Ref Rng & Units 05/03/2023    8:51 AM 05/02/2023    3:49 AM 04/30/2023    5:40 AM  CBC  WBC 4.0 - 10.5 K/uL 6.7  8.4  12.6   Hemoglobin 13.0 - 17.0 g/dL 16.1  09.6  04.5    Hematocrit 39.0 - 52.0 % 31.2  30.0  31.2   Platelets 150 - 400 K/uL 195  214  176       CMP     Component Value Date/Time   NA 139 05/03/2023 0851  K 3.6 05/03/2023 0851   CL 110 05/03/2023 0851   CO2 21 (L) 05/03/2023 0851   GLUCOSE 237 (H) 05/03/2023 0851   BUN 18 05/03/2023 0851   CREATININE 1.58 (H) 05/03/2023 0851   CALCIUM 8.3 (L) 05/03/2023 0851   PROT 7.3 04/29/2023 1923   ALBUMIN 3.3 (L) 04/29/2023 1923   AST 19 04/29/2023 1923   ALT 12 04/29/2023 1923   ALKPHOS 109 04/29/2023 1923   BILITOT 1.4 (H) 04/29/2023 1923   GFR 118.24 10/24/2011 1654   GFRNONAA 54 (L) 05/03/2023 0851     No results found.     Assessment & Plan:   1. BKA stump complication (HCC) Today the wound has progressed.  There is no more tunneling and so packing is no longer necessary.  Will have the patient placed Aquacel on the wound.  In between dressing changes patient advised to clean the wound clean and dry.  Will plan on having the patient return in 3 to 4 weeks for wound reevaluation or sooner if issues apply.   Current Outpatient Medications on File Prior to Visit  Medication Sig Dispense Refill   atorvastatin (LIPITOR) 40 MG tablet Take 40 mg by mouth daily.  3   benazepril (LOTENSIN) 20 MG tablet Take 20 mg by mouth daily.     [facility] SENNA 8.6 MG tablet Take 1 tablet by mouth 2 (two) times daily as needed for constipation.  5   FIASP 100 UNIT/ML SOLN Inject into the skin continuous. Via pump     HYDROcodone-acetaminophen (NORCO) 10-325 MG tablet Take 1 tablet by mouth every 6 (six) hours as needed for moderate pain or severe pain. 20 tablet 0   HYDROmorphone (DILAUDID) 2 MG tablet Take 2 mg by mouth every 8 (eight) hours.     No current facility-administered medications on file prior to visit.    There are no Patient Instructions on file for this visit. No follow-ups on file.   Georgiana Spinner, NP

## 2023-05-31 DIAGNOSIS — E876 Hypokalemia: Secondary | ICD-10-CM | POA: Diagnosis not present

## 2023-05-31 DIAGNOSIS — Z79891 Long term (current) use of opiate analgesic: Secondary | ICD-10-CM

## 2023-05-31 DIAGNOSIS — Z833 Family history of diabetes mellitus: Secondary | ICD-10-CM

## 2023-05-31 DIAGNOSIS — L03116 Cellulitis of left lower limb: Secondary | ICD-10-CM | POA: Diagnosis present

## 2023-05-31 DIAGNOSIS — I129 Hypertensive chronic kidney disease with stage 1 through stage 4 chronic kidney disease, or unspecified chronic kidney disease: Secondary | ICD-10-CM | POA: Diagnosis present

## 2023-05-31 DIAGNOSIS — T8744 Infection of amputation stump, left lower extremity: Principal | ICD-10-CM | POA: Diagnosis present

## 2023-05-31 DIAGNOSIS — Z79899 Other long term (current) drug therapy: Secondary | ICD-10-CM

## 2023-05-31 DIAGNOSIS — E1022 Type 1 diabetes mellitus with diabetic chronic kidney disease: Secondary | ICD-10-CM | POA: Diagnosis present

## 2023-05-31 DIAGNOSIS — Z794 Long term (current) use of insulin: Secondary | ICD-10-CM

## 2023-05-31 DIAGNOSIS — E785 Hyperlipidemia, unspecified: Secondary | ICD-10-CM | POA: Diagnosis present

## 2023-05-31 DIAGNOSIS — F17210 Nicotine dependence, cigarettes, uncomplicated: Secondary | ICD-10-CM | POA: Diagnosis present

## 2023-05-31 DIAGNOSIS — Z9049 Acquired absence of other specified parts of digestive tract: Secondary | ICD-10-CM

## 2023-05-31 DIAGNOSIS — Z8614 Personal history of Methicillin resistant Staphylococcus aureus infection: Secondary | ICD-10-CM

## 2023-05-31 DIAGNOSIS — E1042 Type 1 diabetes mellitus with diabetic polyneuropathy: Secondary | ICD-10-CM | POA: Diagnosis present

## 2023-05-31 DIAGNOSIS — L089 Local infection of the skin and subcutaneous tissue, unspecified: Secondary | ICD-10-CM | POA: Diagnosis not present

## 2023-05-31 DIAGNOSIS — Z9641 Presence of insulin pump (external) (internal): Secondary | ICD-10-CM | POA: Diagnosis present

## 2023-05-31 DIAGNOSIS — Y835 Amputation of limb(s) as the cause of abnormal reaction of the patient, or of later complication, without mention of misadventure at the time of the procedure: Secondary | ICD-10-CM | POA: Diagnosis present

## 2023-05-31 DIAGNOSIS — N1831 Chronic kidney disease, stage 3a: Secondary | ICD-10-CM | POA: Diagnosis present

## 2023-05-31 DIAGNOSIS — Z8619 Personal history of other infectious and parasitic diseases: Secondary | ICD-10-CM

## 2023-05-31 DIAGNOSIS — G894 Chronic pain syndrome: Secondary | ICD-10-CM | POA: Diagnosis present

## 2023-05-31 DIAGNOSIS — Z8249 Family history of ischemic heart disease and other diseases of the circulatory system: Secondary | ICD-10-CM

## 2023-06-01 ENCOUNTER — Inpatient Hospital Stay
Admission: EM | Admit: 2023-06-01 | Discharge: 2023-06-03 | DRG: 565 | Disposition: A | Discharge status: Home / Self Care | Payer: Medicaid Other | Attending: Internal Medicine | Admitting: Internal Medicine

## 2023-06-01 ENCOUNTER — Emergency Department: Payer: Medicaid Other

## 2023-06-01 ENCOUNTER — Other Ambulatory Visit: Payer: Self-pay

## 2023-06-01 ENCOUNTER — Inpatient Hospital Stay: Payer: Medicaid Other

## 2023-06-01 DIAGNOSIS — N1831 Chronic kidney disease, stage 3a: Secondary | ICD-10-CM | POA: Diagnosis not present

## 2023-06-01 DIAGNOSIS — S81002A Unspecified open wound, left knee, initial encounter: Secondary | ICD-10-CM | POA: Diagnosis not present

## 2023-06-01 DIAGNOSIS — E1040 Type 1 diabetes mellitus with diabetic neuropathy, unspecified: Secondary | ICD-10-CM | POA: Diagnosis not present

## 2023-06-01 DIAGNOSIS — Z794 Long term (current) use of insulin: Secondary | ICD-10-CM | POA: Diagnosis not present

## 2023-06-01 DIAGNOSIS — I129 Hypertensive chronic kidney disease with stage 1 through stage 4 chronic kidney disease, or unspecified chronic kidney disease: Secondary | ICD-10-CM | POA: Diagnosis present

## 2023-06-01 DIAGNOSIS — B9562 Methicillin resistant Staphylococcus aureus infection as the cause of diseases classified elsewhere: Secondary | ICD-10-CM | POA: Diagnosis not present

## 2023-06-01 DIAGNOSIS — Z8614 Personal history of Methicillin resistant Staphylococcus aureus infection: Secondary | ICD-10-CM | POA: Diagnosis not present

## 2023-06-01 DIAGNOSIS — T8744 Infection of amputation stump, left lower extremity: Secondary | ICD-10-CM

## 2023-06-01 DIAGNOSIS — I10 Essential (primary) hypertension: Secondary | ICD-10-CM | POA: Diagnosis not present

## 2023-06-01 DIAGNOSIS — E1142 Type 2 diabetes mellitus with diabetic polyneuropathy: Secondary | ICD-10-CM | POA: Diagnosis not present

## 2023-06-01 DIAGNOSIS — E785 Hyperlipidemia, unspecified: Secondary | ICD-10-CM

## 2023-06-01 DIAGNOSIS — Z79899 Other long term (current) drug therapy: Secondary | ICD-10-CM | POA: Diagnosis not present

## 2023-06-01 DIAGNOSIS — Z9049 Acquired absence of other specified parts of digestive tract: Secondary | ICD-10-CM | POA: Diagnosis not present

## 2023-06-01 DIAGNOSIS — Z8619 Personal history of other infectious and parasitic diseases: Secondary | ICD-10-CM | POA: Diagnosis not present

## 2023-06-01 DIAGNOSIS — Z79891 Long term (current) use of opiate analgesic: Secondary | ICD-10-CM | POA: Diagnosis not present

## 2023-06-01 DIAGNOSIS — L089 Local infection of the skin and subcutaneous tissue, unspecified: Secondary | ICD-10-CM | POA: Diagnosis not present

## 2023-06-01 DIAGNOSIS — M7989 Other specified soft tissue disorders: Secondary | ICD-10-CM | POA: Diagnosis not present

## 2023-06-01 DIAGNOSIS — Z9641 Presence of insulin pump (external) (internal): Secondary | ICD-10-CM | POA: Diagnosis present

## 2023-06-01 DIAGNOSIS — E876 Hypokalemia: Secondary | ICD-10-CM | POA: Diagnosis not present

## 2023-06-01 DIAGNOSIS — G894 Chronic pain syndrome: Secondary | ICD-10-CM | POA: Diagnosis present

## 2023-06-01 DIAGNOSIS — Z8249 Family history of ischemic heart disease and other diseases of the circulatory system: Secondary | ICD-10-CM | POA: Diagnosis not present

## 2023-06-01 DIAGNOSIS — M7652 Patellar tendinitis, left knee: Secondary | ICD-10-CM | POA: Diagnosis not present

## 2023-06-01 DIAGNOSIS — L97829 Non-pressure chronic ulcer of other part of left lower leg with unspecified severity: Secondary | ICD-10-CM | POA: Diagnosis not present

## 2023-06-01 DIAGNOSIS — L03116 Cellulitis of left lower limb: Secondary | ICD-10-CM | POA: Diagnosis not present

## 2023-06-01 DIAGNOSIS — Z833 Family history of diabetes mellitus: Secondary | ICD-10-CM | POA: Diagnosis not present

## 2023-06-01 DIAGNOSIS — T8140XA Infection following a procedure, unspecified, initial encounter: Secondary | ICD-10-CM | POA: Diagnosis not present

## 2023-06-01 DIAGNOSIS — Y835 Amputation of limb(s) as the cause of abnormal reaction of the patient, or of later complication, without mention of misadventure at the time of the procedure: Secondary | ICD-10-CM | POA: Diagnosis present

## 2023-06-01 DIAGNOSIS — F17210 Nicotine dependence, cigarettes, uncomplicated: Secondary | ICD-10-CM | POA: Diagnosis present

## 2023-06-01 DIAGNOSIS — Z720 Tobacco use: Secondary | ICD-10-CM | POA: Diagnosis present

## 2023-06-01 DIAGNOSIS — E1042 Type 1 diabetes mellitus with diabetic polyneuropathy: Secondary | ICD-10-CM | POA: Diagnosis present

## 2023-06-01 DIAGNOSIS — E1022 Type 1 diabetes mellitus with diabetic chronic kidney disease: Secondary | ICD-10-CM | POA: Diagnosis present

## 2023-06-01 DIAGNOSIS — R269 Unspecified abnormalities of gait and mobility: Secondary | ICD-10-CM | POA: Diagnosis not present

## 2023-06-01 DIAGNOSIS — R609 Edema, unspecified: Secondary | ICD-10-CM | POA: Diagnosis not present

## 2023-06-01 LAB — COMPREHENSIVE METABOLIC PANEL
ALT: 12 U/L (ref 0–44)
AST: 16 U/L (ref 15–41)
Albumin: 3.3 g/dL — ABNORMAL LOW (ref 3.5–5.0)
Alkaline Phosphatase: 154 U/L — ABNORMAL HIGH (ref 38–126)
Anion gap: 10 (ref 5–15)
BUN: 21 mg/dL — ABNORMAL HIGH (ref 6–20)
CO2: 24 mmol/L (ref 22–32)
Calcium: 9.1 mg/dL (ref 8.9–10.3)
Chloride: 100 mmol/L (ref 98–111)
Creatinine, Ser: 1.68 mg/dL — ABNORMAL HIGH (ref 0.61–1.24)
GFR, Estimated: 50 mL/min — ABNORMAL LOW (ref >60)
Glucose, Bld: 261 mg/dL — ABNORMAL HIGH (ref 70–99)
Potassium: 2.9 mmol/L — ABNORMAL LOW (ref 3.5–5.1)
Sodium: 134 mmol/L — ABNORMAL LOW (ref 135–145)
Total Bilirubin: 0.5 mg/dL (ref 0.3–1.2)
Total Protein: 7.5 g/dL (ref 6.5–8.1)

## 2023-06-01 LAB — GLUCOSE, CAPILLARY
Glucose-Capillary: 132 mg/dL — ABNORMAL HIGH (ref 70–99)
Glucose-Capillary: 77 mg/dL (ref 70–99)
Glucose-Capillary: 155 mg/dL — ABNORMAL HIGH (ref 70–99)
Glucose-Capillary: 185 mg/dL — ABNORMAL HIGH (ref 70–99)
Glucose-Capillary: 224 mg/dL — ABNORMAL HIGH (ref 70–99)

## 2023-06-01 LAB — CBC WITH DIFFERENTIAL/PLATELET
Abs Immature Granulocytes: 0.03 10*3/uL (ref 0.00–0.07)
Basophils Absolute: 0.1 10*3/uL (ref 0.0–0.1)
Basophils Relative: 1 %
Eosinophils Absolute: 0.2 10*3/uL (ref 0.0–0.5)
Eosinophils Relative: 2 %
HCT: 38.7 % — ABNORMAL LOW (ref 39.0–52.0)
Hemoglobin: 13.4 g/dL (ref 13.0–17.0)
Immature Granulocytes: 0 %
Lymphocytes Relative: 21 %
Lymphs Abs: 2.3 10*3/uL (ref 0.7–4.0)
MCH: 28.1 pg (ref 26.0–34.0)
MCHC: 34.6 g/dL (ref 30.0–36.0)
MCV: 81.1 fL (ref 80.0–100.0)
Monocytes Absolute: 0.6 10*3/uL (ref 0.1–1.0)
Monocytes Relative: 5 %
Neutro Abs: 7.6 10*3/uL (ref 1.7–7.7)
Neutrophils Relative %: 71 %
Platelets: 278 10*3/uL (ref 150–400)
RBC: 4.77 MIL/uL (ref 4.22–5.81)
RDW: 13.3 % (ref 11.5–15.5)
WBC: 10.7 10*3/uL — ABNORMAL HIGH (ref 4.0–10.5)
nRBC: 0 % (ref 0.0–0.2)

## 2023-06-01 LAB — LACTIC ACID, PLASMA
Lactic Acid, Venous: 1.6 mmol/L (ref 0.5–1.9)
Lactic Acid, Venous: 1.9 mmol/L (ref 0.5–1.9)

## 2023-06-01 LAB — APTT: aPTT: 28 seconds (ref 24–36)

## 2023-06-01 LAB — PROTIME-INR
INR: 1 (ref 0.8–1.2)
Prothrombin Time: 13.2 seconds (ref 11.4–15.2)

## 2023-06-01 LAB — SEDIMENTATION RATE: Sed Rate: 56 mm/hr — ABNORMAL HIGH (ref 0–15)

## 2023-06-01 LAB — HIV ANTIBODY (ROUTINE TESTING W REFLEX): HIV Screen 4th Generation wRfx: NONREACTIVE

## 2023-06-01 LAB — MAGNESIUM: Magnesium: 2.2 mg/dL (ref 1.7–2.4)

## 2023-06-01 MED ORDER — MAGNESIUM HYDROXIDE 400 MG/5ML PO SUSP: 30.0000 mL | Freq: Every day | ORAL | Status: DC | PRN

## 2023-06-01 MED ORDER — VANCOMYCIN HCL 2000 MG/400ML IV SOLN
2000.0000 mg | Freq: Once | INTRAVENOUS | Status: AC
  Administered 2023-06-01: 2000 mg via INTRAVENOUS
  Filled 2023-06-01: qty 400

## 2023-06-01 MED ORDER — HYDROCODONE-ACETAMINOPHEN 10-325 MG PO TABS
1.0000 | ORAL_TABLET | Freq: Four times a day (QID) | ORAL | Status: DC | PRN
  Filled 2023-06-01: qty 1

## 2023-06-01 MED ORDER — HEPARIN SODIUM (PORCINE) 5000 UNIT/ML IJ SOLN
5000.0000 [IU] | Freq: Three times a day (TID) | INTRAMUSCULAR | Status: DC
  Administered 2023-06-01 – 2023-06-03 (×7): 5000 [IU] via SUBCUTANEOUS
  Filled 2023-06-01 (×7): qty 1

## 2023-06-01 MED ORDER — LABETALOL HCL 5 MG/ML IV SOLN: 20.0000 mg | INTRAVENOUS | Status: DC | PRN

## 2023-06-01 MED ORDER — SODIUM CHLORIDE 0.9 % IV BOLUS
1000.0000 mL | Freq: Once | INTRAVENOUS | Status: AC
  Administered 2023-06-01: 1000 mL via INTRAVENOUS

## 2023-06-01 MED ORDER — HYDROMORPHONE HCL 1 MG/ML IJ SOLN
1.0000 mg | Freq: Once | INTRAMUSCULAR | Status: AC
  Administered 2023-06-01: 1 mg via INTRAVENOUS
  Filled 2023-06-01: qty 1

## 2023-06-01 MED ORDER — SODIUM CHLORIDE 0.9 % IV SOLN: 2.0000 g | Freq: Once | INTRAVENOUS | Status: DC

## 2023-06-01 MED ORDER — VANCOMYCIN HCL IN DEXTROSE 1-5 GM/200ML-% IV SOLN: 1000.0000 mg | Freq: Once | INTRAVENOUS | Status: DC

## 2023-06-01 MED ORDER — HYDROMORPHONE HCL 2 MG PO TABS
2.0000 mg | ORAL_TABLET | Freq: Three times a day (TID) | ORAL | Status: DC
  Administered 2023-06-01 – 2023-06-03 (×8): 2 mg via ORAL
  Filled 2023-06-01 (×8): qty 1

## 2023-06-01 MED ORDER — ATORVASTATIN CALCIUM 20 MG PO TABS
40.0000 mg | ORAL_TABLET | Freq: Every day | ORAL | Status: DC
  Administered 2023-06-01 – 2023-06-03 (×3): 40 mg via ORAL
  Filled 2023-06-01 (×4): qty 2

## 2023-06-01 MED ORDER — ACETAMINOPHEN 650 MG RE SUPP: 650.0000 mg | Freq: Four times a day (QID) | RECTAL | Status: DC | PRN

## 2023-06-01 MED ORDER — SODIUM CHLORIDE 0.9 % IV SOLN
2.0000 g | Freq: Three times a day (TID) | INTRAVENOUS | Status: DC
  Administered 2023-06-01 – 2023-06-03 (×6): 2 g via INTRAVENOUS
  Filled 2023-06-01 (×7): qty 12.5

## 2023-06-01 MED ORDER — SODIUM CHLORIDE 0.9 % IV SOLN
2.0000 g | Freq: Once | INTRAVENOUS | Status: AC
  Administered 2023-06-01: 2 g via INTRAVENOUS
  Filled 2023-06-01: qty 12.5

## 2023-06-01 MED ORDER — POTASSIUM CHLORIDE IN NACL 20-0.9 MEQ/L-% IV SOLN
INTRAVENOUS | Status: DC
  Filled 2023-06-01: qty 1000

## 2023-06-01 MED ORDER — POTASSIUM CHLORIDE CRYS ER 20 MEQ PO TBCR
40.0000 meq | EXTENDED_RELEASE_TABLET | Freq: Once | ORAL | Status: AC
  Administered 2023-06-01: 40 meq via ORAL
  Filled 2023-06-01: qty 2

## 2023-06-01 MED ORDER — ENOXAPARIN SODIUM 40 MG/0.4ML IJ SOSY
40.0000 mg | PREFILLED_SYRINGE | INTRAMUSCULAR | Status: DC
  Filled 2023-06-01: qty 0.4

## 2023-06-01 MED ORDER — NICOTINE 21 MG/24HR TD PT24
21.0000 mg | MEDICATED_PATCH | Freq: Every day | TRANSDERMAL | Status: DC
  Administered 2023-06-03: 21 mg via TRANSDERMAL
  Filled 2023-06-01 (×2): qty 1

## 2023-06-01 MED ORDER — HYDRALAZINE HCL 20 MG/ML IJ SOLN: 5.0000 mg | INTRAMUSCULAR | Status: DC | PRN

## 2023-06-01 MED ORDER — SENNA 8.6 MG PO TABS: 1.0000 | ORAL_TABLET | Freq: Two times a day (BID) | ORAL | Status: DC | PRN

## 2023-06-01 MED ORDER — LABETALOL HCL 5 MG/ML IV SOLN
INTRAVENOUS | Status: AC
  Administered 2023-06-01: 20 mg via INTRAVENOUS
  Filled 2023-06-01: qty 4

## 2023-06-01 MED ORDER — ONDANSETRON HCL 4 MG PO TABS: 4.0000 mg | ORAL_TABLET | Freq: Four times a day (QID) | ORAL | Status: DC | PRN

## 2023-06-01 MED ORDER — ONDANSETRON HCL 4 MG/2ML IJ SOLN: 4.0000 mg | Freq: Four times a day (QID) | INTRAMUSCULAR | Status: DC | PRN

## 2023-06-01 MED ORDER — INSULIN PUMP
Freq: Three times a day (TID) | SUBCUTANEOUS | Status: DC
  Filled 2023-06-01: qty 1

## 2023-06-01 MED ORDER — TRAZODONE HCL 50 MG PO TABS: 25.0000 mg | ORAL_TABLET | Freq: Every evening | ORAL | Status: DC | PRN

## 2023-06-01 MED ORDER — MORPHINE SULFATE (PF) 2 MG/ML IV SOLN
2.0000 mg | INTRAVENOUS | Status: DC | PRN
  Administered 2023-06-01 – 2023-06-03 (×7): 2 mg via INTRAVENOUS
  Filled 2023-06-01 (×7): qty 1

## 2023-06-01 MED ORDER — BENAZEPRIL HCL 20 MG PO TABS
20.0000 mg | ORAL_TABLET | Freq: Every day | ORAL | Status: DC
  Administered 2023-06-01 – 2023-06-03 (×3): 20 mg via ORAL
  Filled 2023-06-01 (×3): qty 1

## 2023-06-01 MED ORDER — POTASSIUM CHLORIDE 20 MEQ PO PACK
40.0000 meq | PACK | Freq: Once | ORAL | Status: AC
  Administered 2023-06-01: 40 meq via ORAL
  Filled 2023-06-01: qty 2

## 2023-06-01 MED ORDER — ACETAMINOPHEN 325 MG PO TABS: 650.0000 mg | ORAL_TABLET | Freq: Four times a day (QID) | ORAL | Status: DC | PRN

## 2023-06-01 MED ORDER — VANCOMYCIN HCL 1500 MG/300ML IV SOLN
1500.0000 mg | INTRAVENOUS | Status: DC
  Administered 2023-06-01 – 2023-06-02 (×2): 1500 mg via INTRAVENOUS
  Filled 2023-06-01 (×2): qty 300

## 2023-06-01 NOTE — Consult Note (Incomplete)
[facility] [place]  ASSESSMENT / PLAN: 47 y.o. male with left below knee amputation infection. Recent I&D by Dr. Wyn Quaker 05/02/23.  Last A1c 12.2. MRI not definitive for osteomyeltitis, but abnormality seen at medial tibial plateau adjacent to healing ulcer, which could represent early osteomyelitis. No urgent indication for intervention at this moment. Conversion to above knee amputation would be definitive, but patient strongly prefers to avoid AKA. I think the best course of action would be treatment with a 6 week course of IV antibiotics with serial outpatient reassessment. Will review with Dr. Wyn Quaker tonight.    CHIEF COMPLAINT: left BKA infection  HISTORY OF PRESENT ILLNESS: Richard Mendoza is a 47 y.o. male admitted to the hospital for BKA stump infection.  He is well-known to [facility] service, having undergone left below-knee amputation in 2021.  More recently, you underwent incision and drainage of a distal thigh abscess on 05/02/2023.  Has been followed in the office and has seen appropriate healing from this.  Over the past several days the patient noticed increasing redness around the left knee.  This prompted his presentation to the ER.  Patient was started on IV antibiotics.  An MRI of the left knee was performed.  This showed possible early osteomyelitis..  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;  Surgeon: Linus Galas, DPM;  Location: [facility];  Service: Podiatry;  Laterality: Left;   AMPUTATION Left 11/16/2020   Procedure: AMPUTATION BELOW KNEE;  Surgeon: Annice Needy, MD;  Location: [facility];  Service: Vascular;  Laterality: Left;   ANTERIOR CERVICAL DECOMP/DISCECTOMY FUSION  N/A 09/09/2017   Procedure: ANTERIOR CERVICAL DECOMPRESSION/DISCECTOMY FUSION CERVICAL 6- CERVICAL 7;  Surgeon: Coletta Memos, MD;  [facility];  Service: Neurosurgery;  Laterality: N/A;  ANTERIOR CERVICAL DECOMPRESSION/DISCECTOMY FUSION CERVICAL 6- CERVICAL 7   APPENDECTOMY     I & D EXTREMITY Right 10/03/2017   Procedure: IRRIGATION AND DEBRIDEMENT RIGHT WRIST;  Surgeon: Betha Loa, MD;  Location: [facility];  Service: Orthopedics;  Laterality: Right;   I & D EXTREMITY Right 11/26/2018   Procedure: IRRIGATION AND DEBRIDEMENT FASCIA ON RIGHT FOOT;  Surgeon: Gwyneth Revels, DPM;  Location: [facility];  Service: Podiatry;  Laterality: Right;   INCISION AND DRAINAGE Right 03/06/2019   Procedure: INCISION AND DRAINAGE RIGHT FOOT, WITH 4th RAY AMPUTATION;  Surgeon: Gwyneth Revels, DPM;  Location: [facility];  Service: Podiatry;  Laterality: Right;   INCISION AND DRAINAGE Left 11/07/2020   Procedure: INCISION AND DRAINAGE;  Surgeon: Linus Galas, DPM;  Location: [facility];  Service: Podiatry;  Laterality: Left;   INCISION AND DRAINAGE ABSCESS Left 05/02/2023   Procedure: INCISION AND DRAINAGE ABSCESS LEFT LOWER EXTREMITY;  Surgeon: Annice Needy, MD;  Location: [facility];  Service: Vascular;  Laterality: Left;   IRRIGATION AND DEBRIDEMENT FOOT Left 11/11/2020   Procedure: IRRIGATION AND DEBRIDEMENT FOOT;  Surgeon: Linus Galas, DPM;  Location: [facility];  Service: Podiatry;  Laterality: Left;   METATARSAL HEAD EXCISION Right 05/15/2019   Procedure: OSTECTOMY;MET HEAD 5;  Surgeon: Gwyneth Revels, DPM;  Location: [facility];  Service: Podiatry;  Laterality: Right;   osteomylitis     ROTATOR CUFF REPAIR Left     Family  History  Problem Relation Age of Onset   Diabetes Mother    Heart disease Father    Diabetes Father    Arthritis Other    Hyperlipidemia Other    Hypertension Other    Cancer Other        breast   Mental illness Neg Hx     Social History   Socioeconomic History   Marital status: Married     Spouse name: Not on file   Number of children: 4   Years of education: Not on file   Highest education level: Not on file  Occupational History   Not on file  Tobacco Use   Smoking status: Some Days    Current packs/day: 0.00    Average packs/day: 1 pack/day for 17.0 years (17.0 ttl pk-yrs)    Types: Cigarettes    Start date: 01/14/2002    Last attempt to quit: 01/14/2019    Years since quitting: 4.3   Smokeless tobacco: Never  Vaping Use   Vaping status: Never Used  Substance and Sexual Activity   Alcohol use: Yes    Comment: rare   Drug use: No   Sexual activity: Yes  Other Topics Concern   Not on file  Social History Narrative   Not on file   Social Determinants of Health   Financial Resource Strain: Low Risk  (07/24/2022)   Received from [facility], [facility]   Overall Financial Resource Strain (CARDIA)    Difficulty of Paying Living Expenses: Not hard at all  Food Insecurity: No Food Insecurity (06/01/2023)   Hunger Vital Sign    Worried About Running Out of Food in the Last Year: Never true    Ran Out of Food in the Last Year: Never true  Transportation Needs: No Transportation Needs (06/01/2023)   PRAPARE - Administrator, Civil Service (Medical): No    Lack of Transportation (Non-Medical): No  Physical Activity: Not on file  Stress: No Stress Concern Present (07/24/2022)   Received from [facility], [facility] - Occupational Stress Questionnaire    Feeling of Stress : Not at all  Social Connections: Unknown (03/21/2022)   Received from [facility], [facility]   Social Network    Social Network: Not on file  Intimate Partner Violence: Not At Risk (06/01/2023)   Humiliation, Afraid, Rape, and Kick questionnaire    Fear of Current or Ex-Partner: No    Emotionally Abused: No    Physically Abused: No    Sexually Abused: No    No Known Allergies  Current Facility-Administered Medications   Medication Dose Route Frequency Provider Last Rate Last Admin   acetaminophen (TYLENOL) tablet 650 mg  650 mg Oral Q6H PRN Mansy, Jan A, MD       Or   acetaminophen (TYLENOL) suppository 650 mg  650 mg Rectal Q6H PRN Mansy, Jan A, MD       atorvastatin (LIPITOR) tablet 40 mg  40 mg Oral Daily Mansy, Jan A, MD   40 mg at 06/01/23 0909   benazepril (LOTENSIN) tablet 20 mg  20 mg Oral Daily Mansy, Jan A, MD   20 mg at 06/01/23 0909   ceFEPIme (MAXIPIME) 2 g in sodium chloride 0.9 % 100 mL IVPB  2 g Intravenous Q8H Belue, Nathan S, RPH       heparin injection 5,000 Units  5,000 Units Subcutaneous Q8H Lorretta Harp, MD  hydrALAZINE (APRESOLINE) injection 5 mg  5 mg Intravenous Q2H PRN Lorretta Harp, MD       HYDROcodone-acetaminophen [facility]) 10-325 MG per tablet 1 tablet  1 tablet Oral Q6H PRN Mansy, Jan A, MD       HYDROmorphone (DILAUDID) tablet 2 mg  2 mg Oral Q8H Mansy, Jan A, MD   2 mg at 06/01/23 1610   insulin pump   Subcutaneous TID WC, HS, 0200 Lorretta Harp, MD       magnesium hydroxide (MILK OF MAGNESIA) suspension 30 mL  30 mL Oral Daily PRN Mansy, Jan A, MD       morphine (PF) 2 MG/ML injection 2 mg  2 mg Intravenous Q4H PRN Lorretta Harp, MD   2 mg at 06/01/23 1035   nicotine (NICODERM CQ - dosed in mg/24 hours) patch 21 mg  21 mg Transdermal Daily Lorretta Harp, MD       ondansetron [facility]) tablet 4 mg  4 mg Oral Q6H PRN Mansy, Jan A, MD       Or   ondansetron [facility]) injection 4 mg  4 mg Intravenous Q6H PRN Mansy, Vernetta Honey, MD       senna (SENOKOT) tablet 8.6 mg  1 tablet Oral BID PRN Mansy, Jan A, MD       traZODone (DESYREL) tablet 25 mg  25 mg Oral QHS PRN Mansy, Jan A, MD       [START ON 06/02/2023] vancomycin (VANCOREADY) IVPB 1500 mg/300 mL  1,500 mg Intravenous Q24H Otelia Sergeant, RPH        PHYSICAL EXAM Vitals:   06/01/23 0430 06/01/23 0500 06/01/23 0523 06/01/23 1035  BP: (!) 187/99 (!) 168/90 (!) 145/128 (!) 141/77  Pulse: 69 69 73 65  Resp:   20 18  Temp:   97.7 F (36.5  C) 98 F (36.7 C)  TempSrc:    Oral  SpO2: 98% 100% 100% 100%  Weight:      Height:       Middle-age man in no acute distress Regular rate and rhythm Unlabored breathing Left below-knee amputation.  Distal stump has a chronic callus from prosthetic use.  Medially there is a quarter sized ulcer which appears to be healing well.  There is no surrounding induration erythema or warmth.  It is mildly tender.    PERTINENT LABORATORY AND RADIOLOGIC DATA  Most recent CBC    Latest Ref Rng & Units 06/01/2023   12:14 AM 05/03/2023    8:51 AM 05/02/2023    3:49 AM  CBC  WBC 4.0 - 10.5 K/uL 10.7  6.7  8.4   Hemoglobin 13.0 - 17.0 g/dL 96.0  45.4  09.8   Hematocrit 39.0 - 52.0 % 38.7  31.2  30.0   Platelets 150 - 400 K/uL 278  195  214      Most recent CMP    Latest Ref Rng & Units 06/01/2023   12:14 AM 05/03/2023    8:51 AM 05/02/2023    3:49 AM  CMP  Glucose 70 - 99 mg/dL 119  147  829   BUN 6 - 20 mg/dL 21  18  16    Creatinine 0.61 - 1.24 mg/dL 5.62  1.30  8.65   Sodium 135 - 145 mmol/L 134  139  139   Potassium 3.5 - 5.1 mmol/L 2.9  3.6  3.1   Chloride 98 - 111 mmol/L 100  110  111   CO2 22 - 32 mmol/L 24  21  20   Calcium 8.9 - 10.3 mg/dL 9.1  8.3  8.4   Total Protein 6.5 - 8.1 g/dL 7.5     Total Bilirubin 0.3 - 1.2 mg/dL 0.5     Alkaline Phos 38 - 126 U/L 154     AST 15 - 41 U/L 16     ALT 0 - 44 U/L 12       Renal function Estimated Creatinine Clearance: 60.7 mL/min (A) (by C-G formula based on SCr of 1.68 mg/dL (H)).  Hgb A1c MFr Bld (%)  Date Value  04/30/2023 12.2 (H)    Direct LDL  Date Value Ref Range Status  10/24/2011 130.2 mg/dL Final    Comment:    Optimal:  <100 mg/dLNear or Above Optimal:  100-129 mg/dLBorderline High:  130-159 mg/dLHigh:  160-189 mg/dLVery High:  >190 mg/dL    MRI of left knee shows abnormal marrow edema along the medial tibial plateau.  The radiologist is concerned this could represent early osteomyelitis.  This was not seen on MRI  of 04/30/2023.  Rande Brunt. Lenell Antu, MD [facility] Phone Number: [phone] 06/01/2023 12:20 PM   Total time spent on preparing this encounter including chart review, data review, collecting history, examining the patient, coordinating care for this new patient, 60 minutes.  Portions of this report may have been transcribed using voice recognition software.  Every effort has been made to ensure accuracy; however, inadvertent computerized transcription errors may still be present.

## 2023-06-01 NOTE — H&P (Signed)
History and Physical    Richard Mendoza ZOX:[id number] DOB: 28-Aug-1976 DOA: 06/01/2023  Referring MD/NP/PA:   PCP: Barbette Reichmann, MD   Patient coming from:  The patient is coming from home.     Chief Complaint: wound infection in left BKA stump  HPI: Richard Mendoza is a 47 y.o. male with medical history significant of necrotizing fasciitis status post left BKA, DM-type I on insulin pump, hypertension, chronic pain syndrome, CKD-3a, HLD, tobacco abuse, who presents with wound infection in left BKA stump.  Pt was recently hospitalized from 6/10 - 6/14 due to wound infection in the left BKA stump. MRI showed an abscess adjacent to medial femoral condyle. I&D was performed by Dr. Wyn Quaker of [facility]. Pt was discharged home with packing/dressing changes and antibiotics x 1 week. He had recent follow-up with vascular surgery on 05/28/2023, and his wound was healing nicely, packing was discontinued and changed to Aquacel. Since yesterday, his stump wound began to become squishy, mildly red and more painful.  The pain is constant, sharp, severe, nonradiating.  No fever or chills. He states that these are the exact symptoms when he has cellulitis leading to sepsis.  Patient does not have chest pain, cough, shortness of breath.  No nausea, vomiting, diarrhea or abdominal pain.  No symptoms of UTI.   Data reviewed independently and ED Course: pt was found to have WBC 10.7, stable renal function, temperature normal, blood pressure 145/128, heart rate 99 --> 73, RR 20, oxygen saturation 100% on room air.  Patient is admitted to MedSurg bed as inpatient.  EKG:  Not done in ED, will get one.    X-ray of left knee: 1. Soft tissue swelling and superficial ulceration involving the terminal soft tissues of the lower extremity stump. No radiographic evidence of osteomyelitis.   Review of Systems:   General: no fevers, chills, no body weight gain, fatigue HEENT: no blurry vision, hearing changes or sore  throat Respiratory: no dyspnea, coughing, wheezing CV: no chest pain, no palpitations GI: no nausea, vomiting, abdominal pain, diarrhea, constipation GU: no dysuria, burning on urination, increased urinary frequency, hematuria  Ext: no leg edema. S/p of left BKA. Has wound in left BKA stump with pain Neuro: no unilateral weakness, numbness, or tingling, no vision change or hearing loss Skin: no rash, no skin tear. MSK: No muscle spasm, no deformity, no limitation of range of movement in spin Heme: No easy bruising.  Travel history: No recent long distant travel.   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;  Surgeon: Linus Galas, DPM;  Location: [facility];  Service: Podiatry;  Laterality: Left;   AMPUTATION Left 11/16/2020   Procedure: AMPUTATION BELOW KNEE;  Surgeon: Annice Needy, MD;  Location: [facility];  Service: Vascular;  Laterality: Left;   ANTERIOR CERVICAL DECOMP/DISCECTOMY FUSION N/A 09/09/2017   Procedure: ANTERIOR CERVICAL DECOMPRESSION/DISCECTOMY FUSION CERVICAL 6- CERVICAL 7;  Surgeon: Coletta Memos, MD;  [facility]: [facility];  Service: Neurosurgery;  Laterality: N/A;  ANTERIOR CERVICAL DECOMPRESSION/DISCECTOMY FUSION CERVICAL 6- CERVICAL 7   APPENDECTOMY     I & D EXTREMITY Right 10/03/2017   Procedure: IRRIGATION AND DEBRIDEMENT RIGHT WRIST;  Surgeon: Betha Loa, MD;  Location: [facility];  Service: Orthopedics;  Laterality: Right;   I & D EXTREMITY Right 11/26/2018   Procedure: IRRIGATION AND DEBRIDEMENT FASCIA ON RIGHT FOOT;  Surgeon: Gwyneth Revels, DPM;  Location: [facility];  Service: Podiatry;  Laterality: Right;   INCISION AND DRAINAGE Right 03/06/2019    Procedure: INCISION AND DRAINAGE RIGHT FOOT, WITH 4th RAY AMPUTATION;  Surgeon: Gwyneth Revels, DPM;  Location: [facility];  Service: Podiatry;  Laterality: Right;   INCISION AND DRAINAGE Left 11/07/2020   Procedure: INCISION AND DRAINAGE;  Surgeon: Linus Galas, DPM;  Location: [facility];  Service: Podiatry;  Laterality: Left;   INCISION AND DRAINAGE ABSCESS Left 05/02/2023   Procedure: INCISION AND DRAINAGE ABSCESS LEFT LOWER EXTREMITY;  Surgeon: Annice Needy, MD;  Location: [facility];  Service: Vascular;  Laterality: Left;   IRRIGATION AND DEBRIDEMENT FOOT Left 11/11/2020   Procedure: IRRIGATION AND DEBRIDEMENT FOOT;  Surgeon: Linus Galas, DPM;  Location: [facility];  Service: Podiatry;  Laterality: Left;   METATARSAL HEAD EXCISION Right 05/15/2019   Procedure: OSTECTOMY;MET HEAD 5;  Surgeon: Gwyneth Revels, DPM;  Location: [facility];  Service: Podiatry;  Laterality: Right;   osteomylitis     ROTATOR CUFF REPAIR Left     Social History:  reports that he has been smoking cigarettes. He started smoking about 21 years ago. He has a 17 pack-year smoking history. He has never used smokeless tobacco. He reports current alcohol use. He reports that he does not use drugs.  Family History:  Family History  Problem Relation Age of Onset   Diabetes Mother    Heart disease Father    Diabetes Father    Arthritis Other    Hyperlipidemia Other    Hypertension Other    Cancer Other        breast   Mental illness Neg Hx      Prior to Admission medications   Medication Sig Start Date End Date Taking? Authorizing Provider  atorvastatin (LIPITOR) 40 MG tablet Take 40 mg by mouth daily. 08/23/17  Yes [provider]  benazepril (LOTENSIN) 20 MG tablet Take 20 mg by mouth daily. 06/01/21  Yes [provider]  [facility] SENNA 8.6 MG tablet Take 1 tablet by mouth 2 (two) times daily as needed for constipation. 10/31/17  Yes [provider]  FIASP 100 UNIT/ML SOLN Inject into the skin  continuous. Via pump 10/09/20  Yes [provider]  HYDROcodone-acetaminophen (NORCO) 10-325 MG tablet Take 1 tablet by mouth every 6 (six) hours as needed for moderate pain or severe pain. 05/03/23  Yes Sunnie Nielsen, DO  HYDROmorphone (DILAUDID) 2 MG tablet Take 2 mg by mouth every 8 (eight) hours. 05/15/23  Yes [provider]    Physical Exam: Vitals:   06/01/23 0430 06/01/23 0500 06/01/23 0523 06/01/23 1035  BP: (!) 187/99 (!) 168/90 (!) 145/128 (!) 141/77  Pulse: 69 69 73 65  Resp:   20 18  Temp:   97.7 F (36.5 C) 98 F (36.7 C)  TempSrc:    Oral  SpO2: 98% 100% 100% 100%  Weight:      Height:       General: Not in acute distress HEENT:       Eyes: PERRL, EOMI, no jaundice       ENT: No discharge from the ears and nose, no pharynx injection, no tonsillar enlargement.        Neck: No JVD, no bruit, no mass felt. Heme: No neck lymph node enlargement.  Cardiac: S1/S2, RRR, No murmurs, No gallops or rubs. Respiratory: No rales, wheezing, rhonchi or rubs. GI: Soft, nondistended, nontender, no rebound pain, no organomegaly, BS present. GU: No hematuria Ext: No pitting leg edema bilaterally. 1+DP/PT pulse bilaterally. Musculoskeletal: No joint deformities, No joint redness or warmth, no limitation of ROM in spin. Skin: has an approximately quarter sized wound in BKA stump, with granulation tissues, with mild surrounding erythema, tenderness and warmth, with mild fluctuation.     Neuro: Alert, oriented X3, cranial nerves II-XII grossly intact, moves all extremities normally.  Psych: Patient is not psychotic, no suicidal or hemocidal ideation.  Labs on Admission: I have personally reviewed following labs and imaging studies  CBC: Recent Labs  Lab 06/01/23 0014  WBC 10.7*  NEUTROABS 7.6  HGB 13.4  HCT 38.7*  MCV 81.1  PLT 278   Basic Metabolic Panel: Recent Labs  Lab 06/01/23 0014 06/01/23 0611  NA 134*  --   K 2.9*  --   CL 100  --   CO2  24  --   GLUCOSE 261*  --   BUN 21*  --   CREATININE 1.68*  --   CALCIUM 9.1  --   MG  --  2.2   GFR: Estimated Creatinine Clearance: 60.7 mL/min (A) (by C-G formula based on SCr of 1.68 mg/dL (H)). Liver Function Tests: Recent Labs  Lab 06/01/23 0014  AST 16  ALT 12  ALKPHOS 154*  BILITOT 0.5  PROT 7.5  ALBUMIN 3.3*   No results for input(s): "LIPASE", "AMYLASE" in the last 168 hours. No results for input(s): "AMMONIA" in the last 168 hours. Coagulation Profile: No results for input(s): "INR", "PROTIME" in the last 168 hours. Cardiac Enzymes: No results for input(s): "CKTOTAL", "CKMB", "CKMBINDEX", "TROPONINI" in the last 168 hours. BNP (last 3 results) No results for input(s): "PROBNP" in the last 8760 hours. HbA1C: No results for input(s): "HGBA1C" in the last 72 hours. CBG: Recent Labs  Lab 06/01/23 0528 06/01/23 0810  GLUCAP 132* 77   Lipid Profile: No results for input(s): "CHOL", "HDL", "LDLCALC", "TRIG", "CHOLHDL", "LDLDIRECT" in the last 72 hours. Thyroid Function Tests: No results for input(s): "TSH", "T4TOTAL", "FREET4", "T3FREE", "THYROIDAB" in the last 72 hours. Anemia Panel: No results for input(s): "VITAMINB12", "FOLATE", "FERRITIN", "TIBC", "IRON", "RETICCTPCT" in the last 72 hours. Urine analysis:    Component Value Date/Time   COLORURINE STRAW (A) 04/30/2023 0540   APPEARANCEUR CLEAR (A) 04/30/2023 0540   LABSPEC 1.015 04/30/2023 0540   PHURINE 6.0 04/30/2023 0540   GLUCOSEU 150 (A) 04/30/2023 0540   HGBUR SMALL (A) 04/30/2023 0540   BILIRUBINUR NEGATIVE 04/30/2023 0540   KETONESUR NEGATIVE 04/30/2023 0540   PROTEINUR 100 (A) 04/30/2023 0540   UROBILINOGEN 0.2 07/31/2011 2217   NITRITE NEGATIVE 04/30/2023 0540   LEUKOCYTESUR NEGATIVE 04/30/2023 0540   Sepsis Labs: @LABRCNTIP (procalcitonin:4,lacticidven:4) )No results found for this or any previous visit (from the past 240 hour(s)).   Radiological Exams on Admission: DG Knee Complete 4  Views Left  Result Date: 06/01/2023 CLINICAL DATA:  Postoperative infection, medial left knee wound EXAM: LEFT KNEE - COMPLETE 4+ VIEW COMPARISON:  None Available. FINDINGS: Surgical changes of left below-knee amputation are seen. No acute fracture or dislocation. No osseous erosion identified to suggest osteomyelitis. There is soft tissue swelling and superficial ulceration involving the terminal soft tissues of the lower extremity stump. No subcutaneous gas or retained radiopaque foreign body. There is soft tissue swelling seen medial to the left knee joint  space. No effusion. IMPRESSION: 1. Soft tissue swelling and superficial ulceration involving the terminal soft tissues of the lower extremity stump. No radiographic evidence of osteomyelitis. Electronically Signed   By: Helyn Numbers M.D.   On: 06/01/2023 03:05      Assessment/Plan Principal Problem:   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) Active Problems:   Diabetic neuropathy, type I diabetes mellitus (HCC)   Essential hypertension   Chronic kidney disease, stage 3a (HCC)   HLD (hyperlipidemia)   Tobacco abuse   Chronic pain syndrome   Assessment and Plan:  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): X-ray of left knee is negative for osteomyelitis.  On examination, patient has mild fluctuation, cannot rule out abscess.  Will get an MRI of left knee for further evaluation.  Consulted Dr. Lenell Antu of [facility]  - will admit to med-surg bed as inpatient - Empiric antimicrobial treatment with vancomycin, cefepime - PRN Zofran for nausea -Pain control: Continue home as needed Norco, oral Dilaudid, IV as needed morphine, Tylenol - Blood cultures x 2  - ESR and CRP - wound care consult - MRI-left knee  Diabetic neuropathy, type I diabetes mellitus (HCC): Recent A1c 12.2, poorly controlled.  Patient's using insulin pump -Continue insulin pump  Essential hypertension -IV hydralazine as  needed -Lotensin  Chronic kidney disease, stage 3a (HCC): Stable.  Recent baseline creatinine 1.5-1.8.  His creatinine is 1.68, BUN 20, GFR 50 -Follow up with BMP  HLD (hyperlipidemia) -Lipitor  Tobacco abuse -Routine patch  Chronic pain syndrome -Current continue home as needed Norco, oral Dilaudid    DVT ppx: SQ Heparin    Code Status: Full code   Family Communication: not done, no family member is at bed side.   Disposition Plan:  Anticipate discharge back to previous environment  Consults called: Dr. Etter Sjogren of [facility]  Admission status and Level of care: Med-Surg:   as inpt      Dispo: The patient is from: Home              Anticipated d/c is to: Home              Anticipated d/c date is: 2 days              Patient currently is not medically stable to d/c.    Severity of Illness:  The appropriate patient status for this patient is INPATIENT. Inpatient status is judged to be reasonable and necessary in order to provide the required intensity of service to ensure the patient's safety. The patient's presenting symptoms, physical exam findings, and initial radiographic and laboratory data in the context of their chronic comorbidities is felt to place them at high risk for further clinical deterioration. Furthermore, it is not anticipated that the patient will be medically stable for discharge from the hospital within 2 midnights of admission.   * I certify that at the point of admission it is my clinical judgment that the patient will require inpatient hospital care spanning beyond 2 midnights from the point of admission due to high intensity of service, high risk for further deterioration and high frequency of surveillance required.*       Date of Service 06/01/2023    Lorretta Harp [facility]   If 7PM-7AM, please contact night-coverage [email or web address] 06/01/2023, 11:24 AM

## 2023-06-01 NOTE — ED Notes (Signed)
New dressing placed with aquacell, curlex, non adherent dressing and coban. Pt tolerated well

## 2023-06-01 NOTE — Plan of Care (Signed)

## 2023-06-01 NOTE — Progress Notes (Signed)
Patient off the floor for MRI. Patient well known to [facility] service admitted to IM service for BKA infection. Agree with broad spectrum Abx. If MRI shows osteomyelitis of tibia, patient may need conversion to above knee amputation. I will check back in later today or early tomorrow morning and leave formal consultation.  Rande Brunt. Lenell Antu, MD [facility] Phone Number: [phone] 06/01/2023 12:36 PM

## 2023-06-01 NOTE — ED Provider Notes (Signed)
[facility] Provider Note    Event Date/Time   First MD Initiated Contact with Patient 06/01/23 0202     (approximate)   History   Wound Infection   HPI  Richard Mendoza is a 47 y.o. male who presents to the ED from home with a chief complaint of wound infection.  Patient with a history of CKD 3, necrotizing fasciitis status post left BKA, diabetes type 1, hypertension, chronic pain syndrome.  Admitted for left stump cellulitis 1 month ago requiring IV vancomycin.  Discharged home with packing/dressing changes and antibiotics x 1 week.  Recent follow-up with vascular surgery on 05/28/2023, wound was healing nicely, packing discontinued and changed to Aquacel.  Patient states yesterday area around his stump wound began to become red and squishy.  States these are the exact symptoms when he has cellulitis leading to sepsis.  Also experiencing increased pain in stump.  Dates he never mounts a fever or has chills.  Denies chest pain, shortness of breath, abdominal pain, nausea, vomiting or dizziness.     Past Medical History   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     Active Problem List   Patient Active Problem List   Diagnosis Date Noted   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) 06/01/2023   Cellulitis and abscess of left leg 04/30/2023   Left leg cellulitis 04/29/2023   Tobacco use 04/29/2023   Carpal tunnel syndrome 07/12/2022   Hyperglycemia due to type 2 diabetes mellitus (HCC) 07/12/2022   Polyneuropathy 07/12/2022   Ulnar neuropathy 07/12/2022   Acute epigastric pain 07/12/2022   Gastro-esophageal reflux disease without esophagitis 07/12/2022   Nausea and vomiting 07/12/2022   Abscess of right upper extremity 02/26/2022   Cellulitis of left lower extremity without foot     Sepsis (HCC) 09/08/2021   Hypokalemia 09/06/2021   Hyponatremia 09/06/2021   PAD (peripheral artery disease) (HCC) 05/26/2021   Acute renal failure superimposed on stage 2 chronic kidney disease (HCC) 04/03/2021   Syncope 04/03/2021   Neck pain    Abnormal weight loss 12/09/2020   Change in bowel habit 12/09/2020   Constipation 12/09/2020   Epigastric pain 12/09/2020   Drug-induced constipation 12/09/2020   Periumbilical pain 12/09/2020   Fatty liver 12/09/2020   S/P BKA (below knee amputation), left (HCC) 11/20/2020   Necrotizing fasciitis of ankle and foot (HCC) 11/07/2020   History of 2019 novel coronavirus disease (COVID-19) 09/01/2020   Major depressive disorder, recurrent, in remission (HCC) 09/01/2020   Pain management contract signed 09/07/2019   Evaluation by psychiatric service required 08/18/2019   History of depression 08/18/2019   Primary osteoarthritis of both shoulders 07/16/2019   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) 07/16/2019   Chronic pain of both shoulders 07/16/2019   Cervical radicular pain 07/16/2019   S/P cervical spinal fusion 07/16/2019   Cervical spondylosis 07/16/2019   Cervical facet joint syndrome 07/16/2019   Chronic pain syndrome 07/16/2019   Anemia of chronic disease 05/25/2019   Osteomyelitis of right foot (HCC) 05/25/2019   Diabetic foot infection (HCC) 03/05/2019   Type 2 diabetes mellitus without complication (HCC) 01/01/2019   Abscess 11/25/2018   Diabetic peripheral neuropathy associated with type 2 diabetes mellitus (HCC) 06/16/2018   Hyperlipidemia associated with type 2 diabetes mellitus (HCC) 06/16/2018   Intractable nausea and vomiting  10/21/2017   HNP (herniated nucleus pulposus), cervical 09/09/2017   Displacement of cervical intervertebral disc without myelopathy 08/27/2017   Erectile dysfunction 07/31/2017   Gastroesophageal reflux disease 07/31/2017   Rotator cuff syndrome of left shoulder 07/31/2017   Cellulitis  of right leg 07/30/2016   Tobacco abuse disorder 07/30/2016   Routine general medical examination at a health care facility 01/20/2014   Diabetic neuropathy, type I diabetes mellitus (HCC) 01/20/2014   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   HYPERLIPIDEMIA 03/17/2009   DM type 2 with diabetic peripheral neuropathy (HCC) 03/02/2009   Uncontrolled diabetes mellitus with hyperglycemia (HCC) 12/08/2008   OBSTRUCTIVE SLEEP APNEA 12/08/2008   Essential hypertension 12/08/2008   Allergic rhinitis 12/15/2007   Other malaise and fatigue 12/15/2007     Past Surgical History   Past Surgical History:  Procedure Laterality Date   AMPUTATION Left 11/07/2020   Procedure: 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;  Surgeon: Linus Galas, DPM;  Location: [facility];  Service: Podiatry;  Laterality: Left;   AMPUTATION Left 11/16/2020   Procedure: AMPUTATION BELOW KNEE;  Surgeon: Annice Needy, MD;  Location: [facility];  Service: Vascular;  Laterality: Left;   ANTERIOR CERVICAL DECOMP/DISCECTOMY FUSION N/A 09/09/2017   Procedure: ANTERIOR CERVICAL DECOMPRESSION/DISCECTOMY FUSION CERVICAL 6- CERVICAL 7;  Surgeon: Coletta Memos, MD;  Location: [facility];  Service: Neurosurgery;  Laterality: N/A;  ANTERIOR CERVICAL DECOMPRESSION/DISCECTOMY FUSION CERVICAL 6- CERVICAL 7   APPENDECTOMY     I & D EXTREMITY Right 10/03/2017   Procedure: IRRIGATION AND DEBRIDEMENT RIGHT WRIST;  Surgeon: Betha Loa, MD;  Location: [facility];  Service: Orthopedics;  Laterality: Right;   I & D EXTREMITY Right 11/26/2018   Procedure: IRRIGATION AND DEBRIDEMENT FASCIA ON RIGHT FOOT;  Surgeon: Gwyneth Revels, DPM;  Location: [facility];  Service: Podiatry;  Laterality: Right;   INCISION AND DRAINAGE Right 03/06/2019   Procedure: INCISION AND DRAINAGE RIGHT FOOT, WITH 4th RAY AMPUTATION;  Surgeon: Gwyneth Revels, DPM;  Location: [facility];  Service: Podiatry;  Laterality: Right;   INCISION AND DRAINAGE Left 11/07/2020   Procedure: INCISION AND  DRAINAGE;  Surgeon: Linus Galas, DPM;  Location: [facility];  Service: Podiatry;  Laterality: Left;   INCISION AND DRAINAGE ABSCESS Left 05/02/2023   Procedure: INCISION AND DRAINAGE ABSCESS LEFT LOWER EXTREMITY;  Surgeon: Annice Needy, MD;  Location: [facility];  Service: Vascular;  Laterality: Left;   IRRIGATION AND DEBRIDEMENT FOOT Left 11/11/2020   Procedure: IRRIGATION AND DEBRIDEMENT FOOT;  Surgeon: Linus Galas, DPM;  Location: [facility];  Service: Podiatry;  Laterality: Left;   METATARSAL HEAD EXCISION Right 05/15/2019   Procedure: OSTECTOMY;MET HEAD 5;  Surgeon: Gwyneth Revels, DPM;  Location: [facility];  Service: Podiatry;  Laterality: Right;   osteomylitis     ROTATOR CUFF REPAIR Left      Home Medications   Prior to Admission medications   Medication Sig Start Date End Date Taking? Authorizing Provider  atorvastatin (LIPITOR) 40 MG tablet Take 40 mg by mouth daily. 08/23/17  Yes [provider]  benazepril (LOTENSIN) 20 MG tablet Take 20 mg by mouth daily. 06/01/21  Yes [provider]  [facility] SENNA 8.6 MG tablet Take 1 tablet by mouth 2 (two) times daily as needed for constipation. 10/31/17  Yes [provider]  FIASP 100 UNIT/ML SOLN Inject into the skin continuous. Via pump 10/09/20  Yes [provider]  HYDROcodone-acetaminophen (NORCO) 10-325 MG tablet Take 1 tablet by mouth every 6 (six) hours as needed for  moderate pain or severe pain. 05/03/23  Yes Sunnie Nielsen, DO  HYDROmorphone (DILAUDID) 2 MG tablet Take 2 mg by mouth every 8 (eight) hours. 05/15/23  Yes [provider]     Allergies  Patient has no known allergies.   Family History   Family History  Problem Relation Age of Onset   Diabetes Mother    Heart disease Father    Diabetes Father    Arthritis Other    Hyperlipidemia Other    Hypertension Other    Cancer Other        breast   Mental illness Neg Hx      Physical Exam  Triage Vital Signs: ED Triage Vitals   Encounter Vitals Group     BP 06/01/23 0011 (!) 179/150     Systolic BP Percentile --      Diastolic BP Percentile --      Pulse Rate 06/01/23 0011 99     Resp 06/01/23 0011 16     Temp 06/01/23 0011 98.1 F (36.7 C)     Temp Source 06/01/23 0011 Oral     SpO2 06/01/23 0011 99 %     Weight 06/01/23 0012 194 lb 0.1 oz (88 kg)     Height 06/01/23 0012 5\' 10"  (1.778 m)     Head Circumference --      Peak Flow --      Pain Score 06/01/23 0011 0     Pain Loc --      Pain Education --      Exclude from Growth Chart --     Updated Vital Signs: BP (!) 145/128 (BP Location: Left Arm)   Pulse 73   Temp 97.7 F (36.5 C)   Resp 20   Ht 5\' 10"  (1.778 m)   Wt 88 kg   SpO2 100%   BMI 27.84 kg/m    General: Awake, no distress.  CV:  Good peripheral perfusion.  Resp:  Normal effort. Abd:  No distention.  Other:  Left knee stump: Approximately quarter sized well-healed, good looking granulation tissue wound to medial aspect.  Warmth and erythema surrounding with mild fluctuance.  No streaking.   ED Results / Procedures / Treatments  Labs (all labs ordered are listed, but only abnormal results are displayed) Labs Reviewed  COMPREHENSIVE METABOLIC PANEL - Abnormal; Notable for the following components:      Result Value   Sodium 134 (*)    Potassium 2.9 (*)    Glucose, Bld 261 (*)    BUN 21 (*)    Creatinine, Ser 1.68 (*)    Albumin 3.3 (*)    Alkaline Phosphatase 154 (*)    GFR, Estimated 50 (*)    All other components within normal limits  CBC WITH DIFFERENTIAL/PLATELET - Abnormal; Notable for the following components:   WBC 10.7 (*)    HCT 38.7 (*)    All other components within normal limits  GLUCOSE, CAPILLARY - Abnormal; Notable for the following components:   Glucose-Capillary 132 (*)    All other components within normal limits  CULTURE, BLOOD (ROUTINE X 2)  CULTURE, BLOOD (ROUTINE X 2)  LACTIC ACID, PLASMA  LACTIC ACID, PLASMA  URINALYSIS, ROUTINE W REFLEX  MICROSCOPIC  HIV ANTIBODY (ROUTINE TESTING W REFLEX)  MAGNESIUM     EKG  None   RADIOLOGY I have independently visualized and interpreted patient's x-rays as well as noted the radiology interpretation:  Knee x-rays: No osteomyelitis  Official radiology report(s): DG  Knee Complete 4 Views Left  Result Date: 06/01/2023 CLINICAL DATA:  Postoperative infection, medial left knee wound EXAM: LEFT KNEE - COMPLETE 4+ VIEW COMPARISON:  None Available. FINDINGS: Surgical changes of left below-knee amputation are seen. No acute fracture or dislocation. No osseous erosion identified to suggest osteomyelitis. There is soft tissue swelling and superficial ulceration involving the terminal soft tissues of the lower extremity stump. No subcutaneous gas or retained radiopaque foreign body. There is soft tissue swelling seen medial to the left knee joint space. No effusion. IMPRESSION: 1. Soft tissue swelling and superficial ulceration involving the terminal soft tissues of the lower extremity stump. No radiographic evidence of osteomyelitis. Electronically Signed   By: Helyn Numbers M.D.   On: 06/01/2023 03:05     PROCEDURES:  Critical Care performed: No  .1-3 Lead EKG Interpretation  Performed by: Irean Hong, MD Authorized by: Irean Hong, MD     Interpretation: normal     ECG rate:  95   ECG rate assessment: normal     Rhythm: sinus rhythm     Ectopy: none     Conduction: normal   Comments:     Patient placed on cardiac monitor to evaluate for arrhythmias    MEDICATIONS ORDERED IN ED: Medications  labetalol (NORMODYNE) injection 20 mg (20 mg Intravenous Given 06/01/23 0442)  HYDROcodone-acetaminophen (NORCO) 10-325 MG per tablet 1 tablet (has no administration in time range)  HYDROmorphone (DILAUDID) tablet 2 mg (has no administration in time range)  atorvastatin (LIPITOR) tablet 40 mg (has no administration in time range)  benazepril (LOTENSIN) tablet 20 mg (has no administration  in time range)  senna (SENOKOT) tablet 8.6 mg (has no administration in time range)  enoxaparin (LOVENOX) injection 40 mg (has no administration in time range)  0.9 % NaCl with KCl 20 mEq/ L  infusion ( Intravenous New Bag/Given 06/01/23 0553)  potassium chloride (KLOR-CON) packet 40 mEq (has no administration in time range)  acetaminophen (TYLENOL) tablet 650 mg (has no administration in time range)    Or  acetaminophen (TYLENOL) suppository 650 mg (has no administration in time range)  traZODone (DESYREL) tablet 25 mg (has no administration in time range)  magnesium hydroxide (MILK OF MAGNESIA) suspension 30 mL (has no administration in time range)  ondansetron (ZOFRAN) tablet 4 mg (has no administration in time range)    Or  ondansetron (ZOFRAN) injection 4 mg (has no administration in time range)  ceFEPIme (MAXIPIME) 2 g in sodium chloride 0.9 % 100 mL IVPB (has no administration in time range)  vancomycin (VANCOREADY) IVPB 1500 mg/300 mL (has no administration in time range)  HYDROmorphone (DILAUDID) injection 1 mg (1 mg Intravenous Given 06/01/23 0320)  sodium chloride 0.9 % bolus 1,000 mL (0 mLs Intravenous Stopped 06/01/23 0402)  ceFEPIme (MAXIPIME) 2 g in sodium chloride 0.9 % 100 mL IVPB (0 g Intravenous Stopped 06/01/23 0350)  vancomycin (VANCOREADY) IVPB 2000 mg/400 mL (0 mg Intravenous Stopped 06/01/23 0554)     IMPRESSION / MDM / ASSESSMENT AND PLAN / ED COURSE  I reviewed the triage vital signs and the nursing notes.                             47 year old male presenting with painful and red left knee stump.  Differential diagnosis includes but is not limited to sepsis, SIRS, cellulitis, abscess, osteomyelitis, abscess, etc.  I personally reviewed patient's records and note his vascular surgery  office visit from 05/28/2023 as well as his hospitalization from June 2024.  Patient's presentation is most consistent with acute presentation with potential threat to life or bodily  function.  The patient is on the cardiac monitor to evaluate for evidence of arrhythmia and/or significant heart rate changes.  Gwyndolyn Kaufman results demonstrate WBC 10.7 which is elevated from discharge labs, mild hypokalemia with potassium 2.9, stable creatinine.  Lactic acid is 1.9.  Will obtain blood cultures, x-ray to evaluate for osteomyelitis.  Start vancomycin and cefepime for broad-spectrum antibiotic coverage, IV Dilaudid for pain.  Will consult hospitalist services for evaluation and admission.  Clinical Course as of 06/01/23 0558  Sat Jun 01, 2023  7829 X-rays demonstrate no osteomyelitis.  Will discuss with hospital services for evaluation and admission. [JS]    Clinical Course User Index [JS] Irean Hong, MD     FINAL CLINICAL IMPRESSION(S) / ED DIAGNOSES   Final diagnoses:  Cellulitis of left lower extremity  Wound infection  Hypokalemia     Rx / DC Orders   ED Discharge Orders     None        Note:  This document was prepared using Dragon voice recognition software and may include unintentional dictation errors.   Irean Hong, MD 06/01/23 [phone]

## 2023-06-01 NOTE — ED Triage Notes (Signed)
Pt to ed from home POV for wound infection. Pt is a below the knee amputee. Pt was just seen recently for same and was admitted. Pt is afebrile. Pt is caox4, in no acute distress in triage.

## 2023-06-01 NOTE — Progress Notes (Signed)
Pharmacy Antibiotic Note  Richard Mendoza is a 47 y.o. male admitted on 06/01/2023 with cellulitis.  Pharmacy has been consulted for Cefepime & Vancomycin dosing for 7 days.  Plan: Cefepime 2 gm q8hr per indication & renal fxn.  Pt given Vancomycin 2000 mg once. Vancomycin 1500 mg IV Q 24 hrs. Goal AUC 400-550. Expected AUC: 464 SCr used: 1.68  Pharmacy will continue to follow and will adjust abx dosing whenever warranted.  Temp (24hrs), Avg:97.9 F (36.6 C), Min:97.7 F (36.5 C), Max:98.1 F (36.7 C)   Recent Labs  Lab 06/01/23 0014 06/01/23 0318  WBC 10.7*  --   CREATININE 1.68*  --   LATICACIDVEN 1.9 1.6    Estimated Creatinine Clearance: 60.7 mL/min (A) (by C-G formula based on SCr of 1.68 mg/dL (H)).    No Known Allergies  Antimicrobials this admission: 7/13 Cefepime >> x 7 days 7/13 Vancomycin >> x 7 days  Microbiology results: 7/13 BCx: Pending  Thank you for allowing pharmacy to be a part of this patient's care.  Otelia Sergeant, PharmD, [facility] 06/01/2023 5:41 AM

## 2023-06-02 DIAGNOSIS — T8744 Infection of amputation stump, left lower extremity: Secondary | ICD-10-CM | POA: Diagnosis not present

## 2023-06-02 LAB — BLOOD CULTURE ID PANEL (REFLEXED) - BCID2

## 2023-06-02 LAB — GLUCOSE, CAPILLARY
Glucose-Capillary: 175 mg/dL — ABNORMAL HIGH (ref 70–99)
Glucose-Capillary: 116 mg/dL — ABNORMAL HIGH (ref 70–99)
Glucose-Capillary: 132 mg/dL — ABNORMAL HIGH (ref 70–99)
Glucose-Capillary: 176 mg/dL — ABNORMAL HIGH (ref 70–99)
Glucose-Capillary: 187 mg/dL — ABNORMAL HIGH (ref 70–99)

## 2023-06-02 LAB — BASIC METABOLIC PANEL
Anion gap: 5 (ref 5–15)
BUN: 17 mg/dL (ref 6–20)
CO2: 21 mmol/L — ABNORMAL LOW (ref 22–32)
Calcium: 8.4 mg/dL — ABNORMAL LOW (ref 8.9–10.3)
Chloride: 110 mmol/L (ref 98–111)
Creatinine, Ser: 1.58 mg/dL — ABNORMAL HIGH (ref 0.61–1.24)
GFR, Estimated: 54 mL/min — ABNORMAL LOW (ref >60)
Glucose, Bld: 146 mg/dL — ABNORMAL HIGH (ref 70–99)
Potassium: 3.1 mmol/L — ABNORMAL LOW (ref 3.5–5.1)
Sodium: 136 mmol/L (ref 135–145)

## 2023-06-02 LAB — CBC
HCT: 32.9 % — ABNORMAL LOW (ref 39.0–52.0)
Hemoglobin: 11.1 g/dL — ABNORMAL LOW (ref 13.0–17.0)
MCH: 27.8 pg (ref 26.0–34.0)
MCHC: 33.7 g/dL (ref 30.0–36.0)
MCV: 82.3 fL (ref 80.0–100.0)
Platelets: 206 10*3/uL (ref 150–400)
RBC: 4 MIL/uL — ABNORMAL LOW (ref 4.22–5.81)
RDW: 13.8 % (ref 11.5–15.5)
WBC: 7.7 10*3/uL (ref 4.0–10.5)
nRBC: 0 % (ref 0.0–0.2)

## 2023-06-02 LAB — C-REACTIVE PROTEIN: CRP: 2.1 mg/dL — ABNORMAL HIGH (ref <1.0)

## 2023-06-02 MED ORDER — INSULIN ASPART 100 UNIT/ML IJ SOLN: 0.0000 [IU] | Freq: Three times a day (TID) | INTRAMUSCULAR | Status: DC

## 2023-06-02 MED ORDER — INSULIN ASPART 100 UNIT/ML IJ SOLN: 0.0000 [IU] | Freq: Every day | INTRAMUSCULAR | Status: DC

## 2023-06-02 NOTE — Progress Notes (Signed)
PHARMACY - PHYSICIAN COMMUNICATION CRITICAL VALUE ALERT - BLOOD CULTURE IDENTIFICATION (BCID)  Results for orders placed or performed during the hospital encounter of 06/01/23  Culture, blood (routine x 2)     Status: None (Preliminary result)   Collection Time: 06/01/23  3:17 AM   Specimen: BLOOD RIGHT FOREARM  Result Value Ref Range Status   Specimen Description BLOOD RIGHT FOREARM  Final   Special Requests   Final    BOTTLES DRAWN AEROBIC AND ANAEROBIC Blood Culture adequate volume   Culture  Setup Time   Final    GRAM NEGATIVE RODS AEROBIC BOTTLE ONLY Organism ID to follow CRITICAL RESULT CALLED TO, READ BACK BY AND VERIFIED WITH: NAHTAN Erva Koke @ 0033 06/02/23 LFD Performed at [facility], [address] [place], [place] [address]    Culture GRAM NEGATIVE RODS  Final   Report Status PENDING  Incomplete  Blood Culture ID Panel (Reflexed)     Status: None   Collection Time: 06/01/23  3:17 AM  Result Value Ref Range Status   Enterococcus faecalis NOT DETECTED NOT DETECTED Final   Enterococcus Faecium NOT DETECTED NOT DETECTED Final   Listeria monocytogenes NOT DETECTED NOT DETECTED Final   Staphylococcus species NOT DETECTED NOT DETECTED Final   Staphylococcus aureus (BCID) NOT DETECTED NOT DETECTED Final   Staphylococcus epidermidis NOT DETECTED NOT DETECTED Final   Staphylococcus lugdunensis NOT DETECTED NOT DETECTED Final   Streptococcus species NOT DETECTED NOT DETECTED Final   Streptococcus agalactiae NOT DETECTED NOT DETECTED Final   Streptococcus pneumoniae NOT DETECTED NOT DETECTED Final   Streptococcus pyogenes NOT DETECTED NOT DETECTED Final   A.calcoaceticus-baumannii NOT DETECTED NOT DETECTED Final   Bacteroides fragilis NOT DETECTED NOT DETECTED Final   Enterobacterales NOT DETECTED NOT DETECTED Final   Enterobacter cloacae complex NOT DETECTED NOT DETECTED Final   Escherichia coli NOT DETECTED NOT DETECTED Final   Klebsiella aerogenes NOT DETECTED NOT  DETECTED Final   Klebsiella oxytoca NOT DETECTED NOT DETECTED Final   Klebsiella pneumoniae NOT DETECTED NOT DETECTED Final   Proteus species NOT DETECTED NOT DETECTED Final   Salmonella species NOT DETECTED NOT DETECTED Final   Serratia marcescens NOT DETECTED NOT DETECTED Final   Haemophilus influenzae NOT DETECTED NOT DETECTED Final   Neisseria meningitidis NOT DETECTED NOT DETECTED Final   Pseudomonas aeruginosa NOT DETECTED NOT DETECTED Final   Stenotrophomonas maltophilia NOT DETECTED NOT DETECTED Final   Candida albicans NOT DETECTED NOT DETECTED Final   Candida auris NOT DETECTED NOT DETECTED Final   Candida glabrata NOT DETECTED NOT DETECTED Final   Candida krusei NOT DETECTED NOT DETECTED Final   Candida parapsilosis NOT DETECTED NOT DETECTED Final   Candida tropicalis NOT DETECTED NOT DETECTED Final   Cryptococcus neoformans/gattii NOT DETECTED NOT DETECTED Final    Comment: Performed at [facility] [address] [place], [place] [address]    BCx Results: 1 (aerobic) of 4 bottles w/ GNR, no BCID.  Pt currently on Cefepime and Vancomycin.  Name of provider contacted: Andrez Grime, MD   Changes to prescribed antibiotics required: No changes at this time.  Otelia Sergeant, PharmD, MBA 06/02/2023 1:00 AM

## 2023-06-02 NOTE — Progress Notes (Signed)
[facility]  - [place] at [facility]   PATIENT NAME: Richard Mendoza    MR#:  [id number]  DATE OF BIRTH:  1976/06/01  SUBJECTIVE:  no family at bedside. Patient seen earlier. Came in with infection on the left BKA stump. Was recently in the hospital from 6/10--6/14 patient recently had incision and drainage performed by Dr. Wyn Quaker was discharged home with packing and dressing. Stump had healed. However started noticing some drainage from that side for last couple days. Denies any fever.       VITALS:  Blood pressure (!) 140/75, pulse 71, temperature 98.2 F (36.8 C), resp. rate 18, height 5\' 10"  (1.778 m), weight 88 kg, SpO2 100%.  PHYSICAL EXAMINATION:   GENERAL:  47 y.o.-year-old patient with no acute distress.  LUNGS: Normal breath sounds bilaterally, no wheezing CARDIOVASCULAR: S1, S2 normal. No murmur   ABDOMEN: Soft, nontender, nondistended. Bowel sounds present.  EXTREMITIES: left amputation stump    NEUROLOGIC: nonfocal  patient is alert and awake   LABORATORY PANEL:  CBC Recent Labs  Lab 06/02/23 0348  WBC 7.7  HGB 11.1*  HCT 32.9*  PLT 206    Chemistries  Recent Labs  Lab 06/01/23 0014 06/01/23 0611 06/02/23 0348  NA 134*  --  136  K 2.9*  --  3.1*  CL 100  --  110  CO2 24  --  21*  GLUCOSE 261*  --  146*  BUN 21*  --  17  CREATININE 1.68*  --  1.58*  CALCIUM 9.1  --  8.4*  MG  --  2.2  --   AST 16  --   --   ALT 12  --   --   ALKPHOS 154*  --   --   BILITOT 0.5  --   --    Cardiac Enzymes No results for input(s): "TROPONINI" in the last 168 hours. 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 EXAM: MRI OF THE LEFT KNEE WITHOUT CONTRAST TECHNIQUE: Multiplanar, multisequence MR imaging of the knee was performed. No intravenous contrast was administered. COMPARISON:  04/30/2023 FINDINGS: Today's exam expanded the field of view  to include the stump, including more region at the expense of spatial resolution. MENISCI Medial meniscus:  Unremarkable Lateral meniscus:  Unremarkable LIGAMENTS Cruciates:  Unremarkable Collaterals:  Unremarkable CARTILAGE Patellofemoral:  Unremarkable Medial:  Unremarkable Lateral:  Unremarkable Joint:  Upper normal amount of fluid in the knee joint. Popliteal Fossa:  Unremarkable Extensor Mechanism: Substantial patellar tendinopathy similar prior. Bones: Abnormal marrow edema along the medial tibial plateau anteriorly, especially subcortically Other: Cutaneous ulceration medial to the medial compartment on image 14 series 23 with underlying subcutaneous fluid and infiltrative edema measuring about 6.2 by 0.6 by 7.1 cm. Most of this is infiltrative although a small component of abscess is difficult to exclude on today's noncontrast exam. I do not see substantial gas tracking in the soft tissues. Some of this edema is adjacent to the posterior portion of the medial patellar retinaculum and medial patellofemoral ligament, and also adjacent to the upper part of the pes anserinus and MCL. It is not an conceivable that the edema signal in the proximal tibial plateau anteriorly could represent early infection given the proximity to this process and the lack of similar edema signal on 04/30/2023. IMPRESSION: 1. Cutaneous ulceration medial to the medial compartment with underlying subcutaneous fluid and infiltrative edema  measuring about 6.2 by 0.6 by 7.1 cm. Most of this is infiltrative although a small component of abscess is difficult to exclude on today's noncontrast exam. I do not see substantial gas tracking in the soft tissues. 2. New abnormal marrow edema along the medial tibial plateau anteriorly, especially subcortically. This could represent early osteomyelitis given the proximity to this process and the lack of similar edema signal on 04/30/2023. 3. Substantial patellar tendinopathy similar prior.  Electronically Signed   By: Gaylyn Rong M.D.   On: 06/01/2023 16:38   DG Knee Complete 4 Views Left  Result Date: 06/01/2023 CLINICAL DATA:  Postoperative infection, medial left knee wound EXAM: LEFT KNEE - COMPLETE 4+ VIEW COMPARISON:  None Available. FINDINGS: Surgical changes of left below-knee amputation are seen. No acute fracture or dislocation. No osseous erosion identified to suggest osteomyelitis. There is soft tissue swelling and superficial ulceration involving the terminal soft tissues of the lower extremity stump. No subcutaneous gas or retained radiopaque foreign body. There is soft tissue swelling seen medial to the left knee joint space. No effusion. IMPRESSION: 1. Soft tissue swelling and superficial ulceration involving the terminal soft tissues of the lower extremity stump. No radiographic evidence of osteomyelitis. Electronically Signed   By: Helyn Numbers M.D.   On: 06/01/2023 03:05    Assessment and Plan   Keandre Poyer is a 47 y.o. male with medical history significant of necrotizing fasciitis status post left BKA, DM-type I on insulin pump, hypertension, chronic pain syndrome, CKD-3a, HLD, tobacco abuse, who presents with wound infection in left BKA stump.   Pt was recently hospitalized from 6/10 - 6/14 due to wound infection in the left BKA stump. MRI showed an abscess adjacent to medial femoral condyle. I&D was performed by Dr. Wyn Quaker of [facility]. Pt was discharged home with packing/dressing changes and antibiotics x 1 week. He had recent follow-up with vascular surgery on 05/28/2023, and his wound was healing nicely, packing was discontinued and changed to Aquacel. Since yesterday, his stump wound began to become squishy, mildly red and more painful.  MRI left knee 06/01/23: 1. Cutaneous ulceration medial to the medial compartment with underlying subcutaneous fluid and infiltrative edema measuring about 6.2 by 0.6 by 7.1 cm. Most of this is infiltrative although a  small component of abscess is difficult to exclude on today's noncontrast exam. I do not see substantial gas tracking in the soft tissues. 2. New abnormal marrow edema along the medial tibial plateau anteriorly, especially subcortically. This could represent early osteomyelitis given the proximity to this process and the lack of similar edema signal on 04/30/2023. 3. Substantial patellar tendinopathy similar prior.  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):  X-ray of left   knee is negative for osteomyelitis.  On examination, patient has mild fluctuation, cannot rule out abscess.  Will get an MRI of left knee for further evaluation.   --Consulted Dr. Lenell Antu of [facility]-- given MRI results were discussed with patient and he prefers to continue IV antibiotic. Patient understands he may need antibiotic for few weeks -- Will get infectious disease consultation tomorrow - Empiric antimicrobial treatment with vancomycin, cefepime - PRN Zofran for nausea -Pain control: Continue home as needed Norco, oral Dilaudid, IV as needed morphine, Tylenol - Blood cultures x 2 --negative -- wound care consult   Diabetic neuropathy, type I diabetes mellitus (HCC): Recent A1c 12.2, poorly controlled.   --Patient's using insulin pump -Continue insulin pump   Essential hypertension -IV hydralazine as needed -  Lotensin   Chronic kidney disease, stage 3a (HCC): Stable.  Recent baseline creatinine 1.5-1.8.  His creatinine is 1.68, BUN 20, GFR 50 -Follow up with BMP   HLD (hyperlipidemia) -Lipitor   Tobacco abuse -Routine patch   Chronic pain syndrome -Current continue home as needed Norco, oral Dilaudid       DVT ppx: SQ Heparin    Code Status: Full code   Family Communication: not done, no family member is at bed side.   Disposition Plan:  Anticipate discharge back to previous environment  Consults called: Dr. Lenell Antu of [facility] Level of care: Med-Surg Status is: Inpatient Remains  inpatient appropriate because: Infection of amputation stump    TOTAL TIME TAKING CARE OF THIS PATIENT: 35 minutes.  >50% time spent on counselling and coordination of care  Note: This dictation was prepared with Dragon dictation along with smaller phrase technology. Any transcriptional errors that result from this process are unintentional.  Enedina Finner M.D    [facility]   CC: Primary care physician; Barbette Reichmann, MD

## 2023-06-02 NOTE — Plan of Care (Signed)
  Problem: Education: Goal: Knowledge of General Education information will improve Description: Including pain rating scale, medication(s)/side effects and non-pharmacologic comfort measures Outcome: Progressing   Problem: Health Behavior/Discharge Planning: Goal: Ability to manage health-related needs will improve Outcome: Progressing   Problem: Clinical Measurements: Goal: Diagnostic test results will improve Outcome: Progressing Goal: Cardiovascular complication will be avoided Outcome: Progressing   Problem: Activity: Goal: Risk for activity intolerance will decrease Outcome: Progressing   Problem: Nutrition: Goal: Adequate nutrition will be maintained Outcome: Progressing   Problem: Elimination: Goal: Will not experience complications related to bowel motility Outcome: Progressing Goal: Will not experience complications related to urinary retention Outcome: Progressing   Problem: Pain Managment: Goal: General experience of comfort will improve Outcome: Progressing   

## 2023-06-02 NOTE — TOC CM/SW Note (Signed)
Transition of Care [facility]) - Inpatient Brief Assessment   Patient Details  Name: Richard Mendoza MRN: [id number] Date of Birth: 13-Jan-1976  Transition of [facility]) CM/SW Contact:    Kemper Durie, RN Phone Number: 06/02/2023, 1:44 PM   Clinical Narrative:    Transition of Care Asessment: Insurance and Status: Insurance coverage has been reviewed Patient has primary care physician: Yes Home environment has been reviewed: [id number] [place] [id number] Prior level of function:: Independent Prior/Current Home Services: No current home services Social Determinants of Health Reivew: SDOH reviewed no interventions necessary Readmission risk has been reviewed: Yes Transition of care needs: no transition of care needs at this time

## 2023-06-02 NOTE — Plan of Care (Signed)
  Problem: Education: Goal: Knowledge of General Education information will improve Description: Including pain rating scale, medication(s)/side effects and non-pharmacologic comfort measures Outcome: Progressing   Problem: Health Behavior/Discharge Planning: Goal: Ability to manage health-related needs will improve Outcome: Progressing   Problem: Clinical Measurements: Goal: Ability to maintain clinical measurements within normal limits will improve Outcome: Progressing Goal: Will remain free from infection Outcome: Progressing Goal: Diagnostic test results will improve Outcome: Progressing Goal: Respiratory complications will improve Outcome: Progressing Goal: Cardiovascular complication will be avoided Outcome: Progressing   Problem: Activity: Goal: Risk for activity intolerance will decrease Outcome: Progressing   Problem: Nutrition: Goal: Adequate nutrition will be maintained Outcome: Progressing   Problem: Coping: Goal: Level of anxiety will decrease Outcome: Progressing   Problem: Elimination: Goal: Will not experience complications related to bowel motility Outcome: Progressing Goal: Will not experience complications related to urinary retention Outcome: Progressing   Problem: Pain Managment: Goal: General experience of comfort will improve Outcome: Progressing   Problem: Safety: Goal: Ability to remain free from injury will improve Outcome: Progressing   Problem: Skin Integrity: Goal: Risk for impaired skin integrity will decrease Outcome: Progressing   Problem: Education: Goal: Knowledge of General Education information will improve Description: Including pain rating scale, medication(s)/side effects and non-pharmacologic comfort measures Outcome: Progressing   Problem: Health Behavior/Discharge Planning: Goal: Ability to manage health-related needs will improve Outcome: Progressing   Problem: Clinical Measurements: Goal: Ability to maintain  clinical measurements within normal limits will improve Outcome: Progressing Goal: Will remain free from infection Outcome: Progressing Goal: Diagnostic test results will improve Outcome: Progressing Goal: Respiratory complications will improve Outcome: Progressing Goal: Cardiovascular complication will be avoided Outcome: Progressing   Problem: Activity: Goal: Risk for activity intolerance will decrease Outcome: Progressing   Problem: Nutrition: Goal: Adequate nutrition will be maintained Outcome: Progressing   Problem: Coping: Goal: Level of anxiety will decrease Outcome: Progressing   Problem: Elimination: Goal: Will not experience complications related to bowel motility Outcome: Progressing Goal: Will not experience complications related to urinary retention Outcome: Progressing   Problem: Pain Managment: Goal: General experience of comfort will improve Outcome: Progressing   Problem: Safety: Goal: Ability to remain free from injury will improve Outcome: Progressing   Problem: Skin Integrity: Goal: Risk for impaired skin integrity will decrease Outcome: Progressing   Problem: Education: Goal: Ability to describe self-care measures that may prevent or decrease complications (Diabetes Survival Skills Education) will improve Outcome: Progressing Goal: Individualized Educational Video(s) Outcome: Progressing   Problem: Coping: Goal: Ability to adjust to condition or change in health will improve Outcome: Progressing   Problem: Fluid Volume: Goal: Ability to maintain a balanced intake and output will improve Outcome: Progressing   Problem: Health Behavior/Discharge Planning: Goal: Ability to identify and utilize available resources and services will improve Outcome: Progressing Goal: Ability to manage health-related needs will improve Outcome: Progressing   Problem: Metabolic: Goal: Ability to maintain appropriate glucose levels will improve Outcome:  Progressing   Problem: Nutritional: Goal: Maintenance of adequate nutrition will improve Outcome: Progressing Goal: Progress toward achieving an optimal weight will improve Outcome: Progressing   Problem: Skin Integrity: Goal: Risk for impaired skin integrity will decrease Outcome: Progressing   Problem: Tissue Perfusion: Goal: Adequacy of tissue perfusion will improve Outcome: Progressing   

## 2023-06-02 NOTE — Consult Note (Signed)
WOC Nurse Consult Note: Reason for Consult:left medial stump with small full thickness wound as noted by Dr. Lenell Antu. Wound type: non healing full thickness Pressure Injury POA: N/A Measurement:Bedside RN to measure and document measurements with next dressing application Wound bed:red, dry Drainage (amount, consistency, odor) none Periwound: intact  Dressing procedure/placement/frequency: I have provided conservative wound care guidance for this wound using a daily NS cleanse followed by placement of a size appropriate piece of xeroform gauze. This is to be topped with dry gauze and secured with a silicone foam.  WOC nursing team will not follow, but will remain available to this patient, the nursing and medical teams.  Please re-consult if needed.  Thank you for inviting [place] to participate in this patient's Plan of Care.  Ladona Mow, MSN, RN, CNS, GNP, Leda Min, [employer], [employer] phone:  [phone]

## 2023-06-02 NOTE — Progress Notes (Signed)
Daily dressing change completed per orders.

## 2023-06-03 ENCOUNTER — Other Ambulatory Visit: Payer: Self-pay

## 2023-06-03 ENCOUNTER — Other Ambulatory Visit (HOSPITAL_COMMUNITY): Payer: Self-pay

## 2023-06-03 DIAGNOSIS — E1142 Type 2 diabetes mellitus with diabetic polyneuropathy: Secondary | ICD-10-CM

## 2023-06-03 DIAGNOSIS — T8744 Infection of amputation stump, left lower extremity: Secondary | ICD-10-CM | POA: Diagnosis not present

## 2023-06-03 DIAGNOSIS — B9562 Methicillin resistant Staphylococcus aureus infection as the cause of diseases classified elsewhere: Secondary | ICD-10-CM

## 2023-06-03 LAB — GLUCOSE, CAPILLARY
Glucose-Capillary: 223 mg/dL — ABNORMAL HIGH (ref 70–99)
Glucose-Capillary: 64 mg/dL — ABNORMAL LOW (ref 70–99)
Glucose-Capillary: 166 mg/dL — ABNORMAL HIGH (ref 70–99)
Glucose-Capillary: 134 mg/dL — ABNORMAL HIGH (ref 70–99)

## 2023-06-03 LAB — CULTURE, BLOOD (ROUTINE X 2): Special Requests: ADEQUATE

## 2023-06-03 MED ORDER — LINEZOLID 600 MG PO TABS
600.0000 mg | ORAL_TABLET | Freq: Two times a day (BID) | ORAL | Status: DC
  Filled 2023-06-03: qty 1

## 2023-06-03 MED ORDER — LINEZOLID 600 MG PO TABS
600.0000 mg | ORAL_TABLET | Freq: Two times a day (BID) | ORAL | 0 refills | Status: DC
  Filled 2023-06-03 (×2): qty 21, 11d supply, fill #0

## 2023-06-03 MED ORDER — LINEZOLID 600 MG PO TABS: 600.0000 mg | ORAL_TABLET | Freq: Two times a day (BID) | ORAL | 0 refills | Status: DC

## 2023-06-03 NOTE — Discharge Instructions (Signed)
 While on the antibiotic linezolid avoid or minimize following foods and drinks as they may interact with linezolid.  These foods and drinks contain tyramine.  Tyramine is a natural product found in some plants and animals.  Too much tyramine in combination with linezolid can cause high levels of serotonin in the body.  Serotonin is a chemical in our body that controls mood, sleep, digestion, and other functions.  Several signs of too much serotonin in the body (also called serotonin syndrome) are fast heart rate, sweating, fevers, high blood pressure, muscle twitching, and confusion.  It is important to seek medical attention if you have these symptoms.   Avoid foods and beverages that are high in tyramine while taking linezolid, including: Alcoholic beverages (such as beer, red wine, vermouth, sherry) Aged cheeses (such as cheddar, blue cheese, swiss, feta, parmesan, camembert) Fermented or pickled foods (such as sauerkraut, pickled beets, pickled peppers, pickled cucumbers/pickles, kim chee/kimchi)  Dried/aged, smoked or processed meats and sausages (such as salami, pepperoni, liverwurst, hot dogs, bologna, bacon) Soybean products (such as soy sauce, tofu) Preserved fish (such as pickled herring) Products that contain large amounts of yeast (such as bouillon cubes, powdered soup/gravy, homemade or sourdough bread)  Broad/fava beans  Following foods are OK to eat while taking linezolid: Pasteurized cheeses (such as [place], ricotta, cottage cheese, cream cheese) Vegetables (not fermented or pickled) Non-cured or smoked meats

## 2023-06-03 NOTE — Consult Note (Signed)
NAME: Richard Mendoza  DOB: [birth year]  MRN: [id number]  Date/Time: 06/03/2023 12:18 PM  REQUESTING PROVIDERDr.Patel Subjective:  REASON FOR CONSULT: left thigh wound ? Richard Mendoza is a 47 y.o. 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  He underwent I&D of the left medial thigh abscess on 05/02/2023 And culture was MRSA.  He was sent home on doxycycline.  He saw vascular as outpatient on 05/28/2023 and the wound was doing good. He presented to the ED on 06/01/2023 with the wound becoming red painful and squishy.  He did not have any fever or chills. In the ED BP was 144/75, temperature 97.8, pulse 73, respiratory rate 19 and sats 99%.  WBC was 10.7, Hb 13.4, platelet 270 and creatinine 1.68.  He was started on IV vancomycin. cefepime I am asked to see the patient for the infection. Pt states that it was the prosthesis that was digging into the thigh and caused a wound. HE has an appt for fitting of new prosthesis 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;  Surgeon: Linus Galas, DPM;  Location: [facility];  Service: Podiatry;  Laterality: Left;   AMPUTATION Left 11/16/2020   Procedure: AMPUTATION BELOW KNEE;  Surgeon: Annice Needy, MD;  Location: [facility];  Service: Vascular;  Laterality: Left;   ANTERIOR CERVICAL DECOMP/DISCECTOMY FUSION N/A 09/09/2017   Procedure: ANTERIOR CERVICAL DECOMPRESSION/DISCECTOMY FUSION CERVICAL 6- CERVICAL 7;  Surgeon: Coletta Memos, MD;  Location: [facility];  Service: Neurosurgery;  Laterality: N/A;  ANTERIOR CERVICAL  DECOMPRESSION/DISCECTOMY FUSION CERVICAL 6- CERVICAL 7   APPENDECTOMY     I & D EXTREMITY Right 10/03/2017   Procedure: IRRIGATION AND DEBRIDEMENT RIGHT WRIST;  Surgeon: Betha Loa, MD;  Location: [facility];  Service: Orthopedics;  Laterality: Right;   I & D EXTREMITY Right 11/26/2018   Procedure: IRRIGATION AND DEBRIDEMENT FASCIA ON RIGHT FOOT;  Surgeon: Gwyneth Revels, DPM;  Location: [facility];  Service: Podiatry;  Laterality: Right;   INCISION AND DRAINAGE Right 03/06/2019   Procedure: INCISION AND DRAINAGE RIGHT FOOT, WITH 4th RAY AMPUTATION;  Surgeon: Gwyneth Revels, DPM;  Location: [facility];  Service: Podiatry;  Laterality: Right;   INCISION AND DRAINAGE Left 11/07/2020   Procedure: INCISION AND DRAINAGE;  Surgeon: Linus Galas, DPM;  Location: [facility];  Service: Podiatry;  Laterality: Left;   INCISION AND DRAINAGE ABSCESS Left 05/02/2023   Procedure: INCISION AND DRAINAGE ABSCESS LEFT LOWER EXTREMITY;  Surgeon: Annice Needy, MD;  Location: [facility];  Service: Vascular;  Laterality: Left;   IRRIGATION AND DEBRIDEMENT FOOT Left 11/11/2020   Procedure: IRRIGATION AND DEBRIDEMENT FOOT;  Surgeon: Linus Galas, DPM;  Location: [facility];  Service: Podiatry;  Laterality: Left;   METATARSAL HEAD EXCISION Right 05/15/2019   Procedure: OSTECTOMY;MET HEAD 5;  Surgeon: Gwyneth Revels, DPM;  Location: [facility];  Service: Podiatry;  Laterality: Right;   osteomylitis     ROTATOR CUFF REPAIR Left     Social History   Socioeconomic History   Marital status: Married    Spouse name: Not on file   Number of children: 4  Years of education: Not on file   Highest education level: Not on file  Occupational History   Not on file  Tobacco Use   Smoking status: Some Days    Current packs/day: 0.00    Average packs/day: 1 pack/day for 17.0 years (17.0 ttl pk-yrs)    Types: Cigarettes    Start date: 01/14/2002    Last attempt to quit: 01/14/2019    Years since quitting: 4.3   Smokeless tobacco: Never  Vaping  Use   Vaping status: Never Used  Substance and Sexual Activity   Alcohol use: Yes    Comment: rare   Drug use: No   Sexual activity: Yes  Other Topics Concern   Not on file  Social History Narrative   Not on file   Social Determinants of Health   Financial Resource Strain: Low Risk  (07/24/2022)   Received from [facility]   Overall Financial Resource Strain (CARDIA)    Difficulty of Paying Living Expenses: Not hard at all  Food Insecurity: No Food Insecurity (06/01/2023)   Hunger Vital Sign    Worried About Running Out of Food in the Last Year: Never true    Ran Out of Food in the Last Year: Never true  Transportation Needs: No Transportation Needs (06/01/2023)   PRAPARE - Administrator, Civil Service (Medical): No    Lack of Transportation (Non-Medical): No  Physical Activity: Not on file  Stress: No Stress Concern Present (07/24/2022)   Received from [facility], [facility] of Occupational Health - Occupational Stress Questionnaire    Feeling of Stress : Not at all  Social Connections: Unknown (03/21/2022)   Received from [facility], [facility]   Social Network    Social Network: Not on file  Intimate Partner Violence: Not At Risk (06/01/2023)   Humiliation, Afraid, Rape, and Kick questionnaire    Fear of Current or Ex-Partner: No    Emotionally Abused: No    Physically Abused: No    Sexually Abused: No    Family History  Problem Relation Age of Onset   Diabetes Mother    Heart disease Father    Diabetes Father    Arthritis Other    Hyperlipidemia Other    Hypertension Other    Cancer Other        breast   Mental illness Neg Hx    No Known Allergies I? Current Facility-Administered Medications  Medication Dose Route Frequency Provider Last Rate Last Admin   acetaminophen (TYLENOL) tablet 650 mg  650 mg Oral Q6H PRN Mansy, Jan A, MD       Or   acetaminophen (TYLENOL) suppository 650 mg  650 mg Rectal Q6H  PRN Mansy, Jan A, MD       atorvastatin (LIPITOR) tablet 40 mg  40 mg Oral Daily Mansy, Jan A, MD   40 mg at 06/03/23 0943   benazepril (LOTENSIN) tablet 20 mg  20 mg Oral Daily Mansy, Jan A, MD   20 mg at 06/03/23 0943   ceFEPIme (MAXIPIME) 2 g in sodium chloride 0.9 % 100 mL IVPB  2 g Intravenous Q8H BelueLendon Collar, RPH 200 mL/hr at 06/03/23 0540 2 g at 06/03/23 0540   heparin injection 5,000 Units  5,000 Units Subcutaneous Q8H Lorretta Harp, MD   5,000 Units at 06/03/23 0536   hydrALAZINE (APRESOLINE) injection 5 mg  5 mg Intravenous Q2H PRN Lorretta Harp, MD  HYDROcodone-acetaminophen (NORCO) 10-325 MG per tablet 1 tablet  1 tablet Oral Q6H PRN Mansy, Jan A, MD       HYDROmorphone (DILAUDID) tablet 2 mg  2 mg Oral Q8H Mansy, Jan A, MD   2 mg at 06/03/23 0535   insulin pump   Subcutaneous TID WC, HS, 0200 Lorretta Harp, MD   Given at 06/03/23 0800   magnesium hydroxide (MILK OF MAGNESIA) suspension 30 mL  30 mL Oral Daily PRN Mansy, Jan A, MD       morphine (PF) 2 MG/ML injection 2 mg  2 mg Intravenous Q4H PRN Lorretta Harp, MD   2 mg at 06/03/23 0730   nicotine (NICODERM CQ - dosed in mg/24 hours) patch 21 mg  21 mg Transdermal Daily Lorretta Harp, MD   21 mg at 06/03/23 0944   ondansetron (ZOFRAN) tablet 4 mg  4 mg Oral Q6H PRN Mansy, Jan A, MD       Or   ondansetron [facility]) injection 4 mg  4 mg Intravenous Q6H PRN Mansy, Vernetta Honey, MD       senna (SENOKOT) tablet 8.6 mg  1 tablet Oral BID PRN Mansy, Jan A, MD       traZODone (DESYREL) tablet 25 mg  25 mg Oral QHS PRN Mansy, Jan A, MD       vancomycin (VANCOREADY) IVPB 1500 mg/300 mL  1,500 mg Intravenous Q24H Otelia Sergeant, RPH   Stopped at 06/03/23 0112     Abtx:  Anti-infectives (From admission, onward)    Start     Dose/Rate Route Frequency Ordered Stop   06/02/23 0000  vancomycin (VANCOREADY) IVPB 1500 mg/300 mL        1,500 mg 150 mL/hr over 120 Minutes Intravenous Every 24 hours 06/01/23 0549 06/07/23 2359   06/01/23 1300  ceFEPIme  (MAXIPIME) 2 g in sodium chloride 0.9 % 100 mL IVPB        2 g 200 mL/hr over 30 Minutes Intravenous Every 8 hours 06/01/23 0544 06/08/23 0559   06/01/23 0630  vancomycin (VANCOCIN) IVPB 1000 mg/200 mL premix  Status:  Discontinued        1,000 mg 200 mL/hr over 60 Minutes Intravenous  Once 06/01/23 0532 06/01/23 0541   06/01/23 0630  ceFEPIme (MAXIPIME) 2 g in sodium chloride 0.9 % 100 mL IVPB  Status:  Discontinued        2 g 200 mL/hr over 30 Minutes Intravenous  Once 06/01/23 0532 06/01/23 0544   06/01/23 0230  vancomycin (VANCOCIN) IVPB 1000 mg/200 mL premix  Status:  Discontinued        1,000 mg 200 mL/hr over 60 Minutes Intravenous  Once 06/01/23 0219 06/01/23 0220   06/01/23 0230  ceFEPIme (MAXIPIME) 2 g in sodium chloride 0.9 % 100 mL IVPB        2 g 200 mL/hr over 30 Minutes Intravenous  Once 06/01/23 0219 06/01/23 0350   06/01/23 0230  vancomycin (VANCOREADY) IVPB 2000 mg/400 mL        2,000 mg 200 mL/hr over 120 Minutes Intravenous  Once 06/01/23 0220 06/01/23 0554       REVIEW OF SYSTEMS:  Const: negative fever, negative chills, negative weight loss Eyes: negative diplopia or visual changes, negative eye pain ENT: negative coryza, negative sore throat Resp: negative cough, hemoptysis, dyspnea Cards: negative for chest pain, palpitations, lower extremity edema GU: negative for frequency, dysuria and hematuria GI: Negative for abdominal pain, diarrhea, bleeding, constipation Skin: as above Heme:  negative for easy bruising and gum/nose bleeding MS: negative for myalgias, arthralgias, back pain and muscle weakness Neurolo:negative for headaches, dizziness, vertigo, memory problems  Psych: negative for feelings of anxiety, depression  Endocrine: , diabetes Allergy/Immunology- NKDA Objective:  VITALS:  BP (!) 189/95 (BP Location: Left Arm)   Pulse 75   Temp 97.7 F (36.5 C)   Resp 17   Ht 5\' 10"  (1.778 m)   Wt 88 kg   SpO2 100%   BMI 27.84 kg/m   PHYSICAL  EXAM:  General: Alert, cooperative, no distress, appears stated age.  Head: Normocephalic, without obvious abnormality, atraumatic. Eyes: Conjunctivae clear, anicteric sclerae. Pupils are equal ENT Nares normal. No drainage or sinus tenderness. Lips, mucosa, and tongue normal. No Thrush Neck: Supple, symmetrical, no adenopathy, thyroid: non tender no carotid bruit and no JVD. Back: No CVA tenderness. Lungs: Clear to auscultation bilaterally. No Wheezing or Rhonchi. No rales. Heart: Regular rate and rhythm, no murmur, rub or gallop. Abdomen: Soft, non-tender,not distended. Bowel sounds normal. No masses Extremities: left BKA = stump is fine Wound on the medial side of the thigh about the knee- granulation tissue  Skin: No rashes or lesions. Or bruising Lymph: Cervical, supraclavicular normal. Neurologic: Grossly non-focal Pertinent Labs Lab Results CBC    Component Value Date/Time   WBC 7.7 06/02/2023 0348   RBC 4.00 (L) 06/02/2023 0348   HGB 11.1 (L) 06/02/2023 0348   HCT 32.9 (L) 06/02/2023 0348   PLT 206 06/02/2023 0348   MCV 82.3 06/02/2023 0348   MCH 27.8 06/02/2023 0348   MCHC 33.7 06/02/2023 0348   RDW 13.8 06/02/2023 0348   LYMPHSABS 2.3 06/01/2023 0014   MONOABS 0.6 06/01/2023 0014   EOSABS 0.2 06/01/2023 0014   BASOSABS 0.1 06/01/2023 0014       Latest Ref Rng & Units 06/02/2023    3:48 AM 06/01/2023   12:14 AM 05/03/2023    8:51 AM  CMP  Glucose 70 - 99 mg/dL 409  811  914   BUN 6 - 20 mg/dL 17  21  18    Creatinine 0.61 - 1.24 mg/dL 7.82  9.56  2.13   Sodium 135 - 145 mmol/L 136  134  139   Potassium 3.5 - 5.1 mmol/L 3.1  2.9  3.6   Chloride 98 - 111 mmol/L 110  100  110   CO2 22 - 32 mmol/L 21  24  21    Calcium 8.9 - 10.3 mg/dL 8.4  9.1  8.3   Total Protein 6.5 - 8.1 g/dL  7.5    Total Bilirubin 0.3 - 1.2 mg/dL  0.5    Alkaline Phos 38 - 126 U/L  154    AST 15 - 41 U/L  16    ALT 0 - 44 U/L  12        Microbiology: Recent Results (from the past  240 hour(s))  Culture, blood (routine x 2)     Status: None (Preliminary result)   Collection Time: 06/01/23  3:17 AM   Specimen: Right Antecubital; Blood  Result Value Ref Range Status   Specimen Description RIGHT ANTECUBITAL  Final   Special Requests   Final    BOTTLES DRAWN AEROBIC AND ANAEROBIC Blood Culture adequate volume   Culture   Final    NO GROWTH 2 DAYS Performed at [facility], [address] [place], [place] [address]    Report Status PENDING  Incomplete  Culture, blood (routine x 2)  Status: None (Preliminary result)   Collection Time: 06/01/23  3:17 AM   Specimen: BLOOD RIGHT FOREARM  Result Value Ref Range Status   Specimen Description BLOOD RIGHT FOREARM  Final   Special Requests   Final    BOTTLES DRAWN AEROBIC AND ANAEROBIC Blood Culture adequate volume   Culture  Setup Time   Final    GRAM NEGATIVE RODS AEROBIC BOTTLE ONLY Organism ID to follow CRITICAL RESULT CALLED TO, READ BACK BY AND VERIFIED WITH: NAHTAN BELUE @ 0033 06/02/23 LFD Performed at [facility], [address] [place], [place] [address]    Culture GRAM NEGATIVE RODS  Final   Report Status PENDING  Incomplete  Blood Culture ID Panel (Reflexed)     Status: None   Collection Time: 06/01/23  3:17 AM  Result Value Ref Range Status   Enterococcus faecalis NOT DETECTED NOT DETECTED Final   Enterococcus Faecium NOT DETECTED NOT DETECTED Final   Listeria monocytogenes NOT DETECTED NOT DETECTED Final   Staphylococcus species NOT DETECTED NOT DETECTED Final   Staphylococcus aureus (BCID) NOT DETECTED NOT DETECTED Final   Staphylococcus epidermidis NOT DETECTED NOT DETECTED Final   Staphylococcus lugdunensis NOT DETECTED NOT DETECTED Final   Streptococcus species NOT DETECTED NOT DETECTED Final   Streptococcus agalactiae NOT DETECTED NOT DETECTED Final   Streptococcus pneumoniae NOT DETECTED NOT DETECTED Final   Streptococcus pyogenes NOT DETECTED NOT DETECTED Final    A.calcoaceticus-baumannii NOT DETECTED NOT DETECTED Final   Bacteroides fragilis NOT DETECTED NOT DETECTED Final   Enterobacterales NOT DETECTED NOT DETECTED Final   Enterobacter cloacae complex NOT DETECTED NOT DETECTED Final   Escherichia coli NOT DETECTED NOT DETECTED Final   Klebsiella aerogenes NOT DETECTED NOT DETECTED Final   Klebsiella oxytoca NOT DETECTED NOT DETECTED Final   Klebsiella pneumoniae NOT DETECTED NOT DETECTED Final   Proteus species NOT DETECTED NOT DETECTED Final   Salmonella species NOT DETECTED NOT DETECTED Final   Serratia marcescens NOT DETECTED NOT DETECTED Final   Haemophilus influenzae NOT DETECTED NOT DETECTED Final   Neisseria meningitidis NOT DETECTED NOT DETECTED Final   Pseudomonas aeruginosa NOT DETECTED NOT DETECTED Final   Stenotrophomonas maltophilia NOT DETECTED NOT DETECTED Final   Candida albicans NOT DETECTED NOT DETECTED Final   Candida auris NOT DETECTED NOT DETECTED Final   Candida glabrata NOT DETECTED NOT DETECTED Final   Candida krusei NOT DETECTED NOT DETECTED Final   Candida parapsilosis NOT DETECTED NOT DETECTED Final   Candida tropicalis NOT DETECTED NOT DETECTED Final   Cryptococcus neoformans/gattii NOT DETECTED NOT DETECTED Final    Comment: Performed at [facility], [address] [place], [place] [address]    IMAGING RESULTS: MRI left knee Cutaneous ulceration with [place] fluid- new abnormal marrow edema  ? Impression/Recommendation ? Superficial wound due to prosthesis pressure- Do not think there is osteomyelitis- MRSA in culture- will treat with linezolid and evaluate the patient in 10 days as OP Discussed the side effects of linezolid and foods and meds to avoid  Left BKA  DM with peripheral neuropathy ? H/o MRSA infection  H/o GBS bacteremia  ___________________________________________________ Discussed with patient, requesting provider Will follow patient in my office Note:  This document was  prepared using Dragon voice recognition software and may include unintentional dictation errors.

## 2023-06-03 NOTE — Plan of Care (Signed)
  Problem: Education: Goal: Knowledge of General Education information will improve Description: Including pain rating scale, medication(s)/side effects and non-pharmacologic comfort measures Outcome: Progressing   Problem: Health Behavior/Discharge Planning: Goal: Ability to manage health-related needs will improve Outcome: Progressing   Problem: Clinical Measurements: Goal: Ability to maintain clinical measurements within normal limits will improve Outcome: Progressing Goal: Will remain free from infection Outcome: Progressing Goal: Diagnostic test results will improve Outcome: Progressing Goal: Respiratory complications will improve Outcome: Progressing Goal: Cardiovascular complication will be avoided Outcome: Progressing   Problem: Activity: Goal: Risk for activity intolerance will decrease Outcome: Progressing   Problem: Nutrition: Goal: Adequate nutrition will be maintained Outcome: Progressing   Problem: Coping: Goal: Level of anxiety will decrease Outcome: Progressing   Problem: Elimination: Goal: Will not experience complications related to bowel motility Outcome: Progressing Goal: Will not experience complications related to urinary retention Outcome: Progressing   Problem: Pain Managment: Goal: General experience of comfort will improve Outcome: Progressing   Problem: Safety: Goal: Ability to remain free from injury will improve Outcome: Progressing   Problem: Skin Integrity: Goal: Risk for impaired skin integrity will decrease Outcome: Progressing   Problem: Education: Goal: Knowledge of General Education information will improve Description: Including pain rating scale, medication(s)/side effects and non-pharmacologic comfort measures Outcome: Progressing   Problem: Health Behavior/Discharge Planning: Goal: Ability to manage health-related needs will improve Outcome: Progressing   Problem: Clinical Measurements: Goal: Ability to maintain  clinical measurements within normal limits will improve Outcome: Progressing Goal: Will remain free from infection Outcome: Progressing Goal: Diagnostic test results will improve Outcome: Progressing Goal: Respiratory complications will improve Outcome: Progressing Goal: Cardiovascular complication will be avoided Outcome: Progressing   Problem: Activity: Goal: Risk for activity intolerance will decrease Outcome: Progressing   Problem: Nutrition: Goal: Adequate nutrition will be maintained Outcome: Progressing   Problem: Coping: Goal: Level of anxiety will decrease Outcome: Progressing   Problem: Elimination: Goal: Will not experience complications related to bowel motility Outcome: Progressing Goal: Will not experience complications related to urinary retention Outcome: Progressing   Problem: Pain Managment: Goal: General experience of comfort will improve Outcome: Progressing   Problem: Safety: Goal: Ability to remain free from injury will improve Outcome: Progressing   Problem: Skin Integrity: Goal: Risk for impaired skin integrity will decrease Outcome: Progressing   Problem: Education: Goal: Ability to describe self-care measures that may prevent or decrease complications (Diabetes Survival Skills Education) will improve Outcome: Progressing Goal: Individualized Educational Video(s) Outcome: Progressing   Problem: Coping: Goal: Ability to adjust to condition or change in health will improve Outcome: Progressing   Problem: Fluid Volume: Goal: Ability to maintain a balanced intake and output will improve Outcome: Progressing   Problem: Health Behavior/Discharge Planning: Goal: Ability to identify and utilize available resources and services will improve Outcome: Progressing Goal: Ability to manage health-related needs will improve Outcome: Progressing   Problem: Metabolic: Goal: Ability to maintain appropriate glucose levels will improve Outcome:  Progressing   Problem: Nutritional: Goal: Maintenance of adequate nutrition will improve Outcome: Progressing Goal: Progress toward achieving an optimal weight will improve Outcome: Progressing   Problem: Skin Integrity: Goal: Risk for impaired skin integrity will decrease Outcome: Progressing   Problem: Tissue Perfusion: Goal: Adequacy of tissue perfusion will improve Outcome: Progressing   

## 2023-06-03 NOTE — Progress Notes (Signed)
Progress Note    06/03/2023 2:49 PM * No surgery found *  Subjective:  Richard Mendoza is a 47 y.o. male with medical history significant of necrotizing fasciitis status post left BKA, DM-type I on insulin pump, hypertension, chronic pain syndrome, CKD-3a, HLD, tobacco abuse, who presents with wound infection in left BKA stump.   On exam this morning patient resting comfortably in bed.  He endorses that he does not wish to pursue any type of interventional vascular procedure at this time.  He would like to proceed with a PICC line and IV antibiotics to go home with to help treat infection.  I did discuss in detail the results of the MRI.  The patient was informed that there was new abnormal marrow edema along the medial tibial plateau anteriorly especially subcortical.  This could represent early osteomyelitis given the proximity of this process to the wound.  Patient verbalizes understanding.  We also had a detailed discussion about his sugar intake and his hemoglobin A1c being 12.2.  Patient was counseled to try to move away from regular soda that he currently has on his tray to diet soda to try to help make his antibiotics more effective.  Again patient verbalizes understanding.  No other complaints today.  Patient would like to be discharged home.  Vitals all remained stable.  Vitals:   06/03/23 0039 06/03/23 0757  BP: (!) 159/85 (!) 189/95  Pulse: 66 75  Resp: 18 17  Temp: 98 F (36.7 C) 97.7 F (36.5 C)  SpO2: 98% 100%   Physical Exam: Cardiac:  RRR, Normal S1, S2  Lungs:  Clear on auscultation throughout, No rales, rhonchi or wheezing. Incisions:  None Extremities:  Right BKA with ulceration/wound noted. See media Picture.  Abdomen:  Positive bowel sounds, soft, non tender and non distended.  Neurologic: AAOX4, follows commands and answers all questions appropriately.   CBC    Component Value Date/Time   WBC 7.7 06/02/2023 0348   RBC 4.00 (L) 06/02/2023 0348   HGB 11.1 (L)  06/02/2023 0348   HCT 32.9 (L) 06/02/2023 0348   PLT 206 06/02/2023 0348   MCV 82.3 06/02/2023 0348   MCH 27.8 06/02/2023 0348   MCHC 33.7 06/02/2023 0348   RDW 13.8 06/02/2023 0348   LYMPHSABS 2.3 06/01/2023 0014   MONOABS 0.6 06/01/2023 0014   EOSABS 0.2 06/01/2023 0014   BASOSABS 0.1 06/01/2023 0014    BMET    Component Value Date/Time   NA 136 06/02/2023 0348   K 3.1 (L) 06/02/2023 0348   CL 110 06/02/2023 0348   CO2 21 (L) 06/02/2023 0348   GLUCOSE 146 (H) 06/02/2023 0348   BUN 17 06/02/2023 0348   CREATININE 1.58 (H) 06/02/2023 0348   CALCIUM 8.4 (L) 06/02/2023 0348   GFRNONAA 54 (L) 06/02/2023 0348   GFRAA >60 05/05/2020 1930    INR    Component Value Date/Time   INR 1.0 06/01/2023 1535     Intake/Output Summary (Last 24 hours) at 06/03/2023 1449 Last data filed at 06/03/2023 1041 Gross per 24 hour  Intake 540 ml  Output 1425 ml  Net -885 ml     Assessment/Plan:  47 y.o. male who was admitted to [facility] for infection to Right lower extremity BKA.  * No surgery found *   PLAN: Infectious Disease consulted for long term antibiotics. Picc Placement needed to go home with IV Antibiotics Continue wound care and Antibiotics with HH. Okay to discharge per vascular surgery when all care  at home set up.   Follow up with Vascular Surgery as scheduled.   DVT prophylaxis:  Heparin 5000 units SQ Q8hrs   Richard Mendoza R Carm[facility] 06/03/2023 2:49 PM

## 2023-06-03 NOTE — Inpatient Diabetes Management (Addendum)
Inpatient Diabetes Program Recommendations  AACE/ADA: New Consensus Statement on Inpatient Glycemic Control (2015)  Target Ranges:  Prepandial:   less than 140 mg/dL      Peak postprandial:   less than 180 mg/dL (1-2 hours)      Critically ill patients:  140 - 180 mg/dL    Latest Reference Range & Units 06/02/23 02:02 06/02/23 08:40 06/02/23 12:43 06/02/23 17:04 06/02/23 21:27  Glucose-Capillary 70 - 99 mg/dL 161 (H) 096 (H) 045 (H) 176 (H) 187 (H)  (H): Data is abnormally high  Latest Reference Range & Units 06/03/23 02:29 06/03/23 07:58  Glucose-Capillary 70 - 99 mg/dL 409 (H) 64 (L)  (H): Data is abnormally high (L): Data is abnormally low   Admit with: Wound infection in BKA stump   History: DM2, CKD  Home DM Meds: Omni Pod Insulin Pump with Fiasp Insulin        Dexcom G6 CGM  Current Orders: Insulin Pump    Note A1c was 12.2% June 2024 Diabetes Coordinator met with pt in June during Admission and provided counseling Pt was supposed to switch from the Medtronic Insulin Pump to the [employer] Insulin Pump  Basal insulin  12A      3.6 units/hour Total daily basal insulin: 86.4 units/24 hours   Carb Coverage 1:4       1 unit for every 4 grams of carbohydrates   Insulin Sensitivity 1:20     1 unit drops blood glucose 20 mg/dl   Target Glucose Goals 12A100-110 mg/dl   Active insulin: 4 hours   Addendum 11:18am--Met w/ pt at bedside.  Pt A&O and able to independently manage insulin pump.  Reviewed HYPO event with pt this AM--Pt told me he usually has a small snack at night and did not snack last PM and that this may have led to his HYPO event this AM.  Remains on the Medtronic pump but is working with ENDO to switch to the [employer] pump.  Changed pump reservoir this AM.  Planning to change site either later today or tomorrow AM (has supplies at bedside).  Confirmed all insulin pump settings with pt (see above for pump settings).  Not currently wearing his Dexcom G6 sensor  but plans to restart sensor when he goes home.  RNs are checking fingerstick CBGs with hospital meter and pt telling RNs how much he is bolusing self with the pump.  Pt appreciative of visit and did not h ave any questions for me at this time.    --Will follow patient during hospitalization--  Ambrose Finland RN, MSN, CDCES Diabetes Coordinator Inpatient Glycemic Control Team Team Pager: [phone] (8a-5p)

## 2023-06-03 NOTE — Progress Notes (Signed)
[facility]  -  at [facility]   PATIENT NAME: Richard Mendoza    MR#:  [id number]  DATE OF BIRTH:  [birth year]  SUBJECTIVE:  no family at bedside. Patient seen earlier. Came in with infection on the left BKA stump. Was recently in the hospital from 6/10--6/14 patient recently had incision and drainage performed by Dr. Wyn Quaker was discharged home with packing and dressing. Stump had healed. However started noticing some drainage from that side for last couple days. Denies any fever. Pt asking as to when he can go home      VITALS:  Blood pressure (!) 189/95, pulse 75, temperature 97.7 F (36.5 C), resp. rate 17, height 5\' 10"  (1.778 m), weight 88 kg, SpO2 100%.  PHYSICAL EXAMINATION:   GENERAL:  47 y.o.-year-old patient with no acute distress.  LUNGS: Normal breath sounds bilaterally, no wheezing CARDIOVASCULAR: S1, S2 normal. No murmur   ABDOMEN: Soft, nontender, nondistended. Bowel sounds present.  EXTREMITIES: left amputation stump    NEUROLOGIC: nonfocal  patient is alert and awake   LABORATORY PANEL:  CBC Recent Labs  Lab 06/02/23 0348  WBC 7.7  HGB 11.1*  HCT 32.9*  PLT 206    Chemistries  Recent Labs  Lab 06/01/23 0014 06/01/23 0611 06/02/23 0348  NA 134*  --  136  K 2.9*  --  3.1*  CL 100  --  110  CO2 24  --  21*  GLUCOSE 261*  --  146*  BUN 21*  --  17  CREATININE 1.68*  --  1.58*  CALCIUM 9.1  --  8.4*  MG  --  2.2  --   AST 16  --   --   ALT 12  --   --   ALKPHOS 154*  --   --   BILITOT 0.5  --   --    Cardiac Enzymes No results for input(s): "TROPONINI" in the last 168 hours. RADIOLOGY:  No results found.  Assessment and Plan   Christine Schiefelbein is a 47 y.o. male with medical history significant of necrotizing fasciitis status post left BKA, DM-type I on insulin pump, hypertension, chronic pain syndrome, CKD-3a, HLD, tobacco abuse, who presents with wound infection in left BKA stump.   Pt was recently hospitalized from  6/10 - 6/14 due to wound infection in the left BKA stump. MRI showed an abscess adjacent to medial femoral condyle. I&D was performed by Dr. Wyn Quaker of [facility]. Pt was discharged home with packing/dressing changes and antibiotics x 1 week. He had recent follow-up with vascular surgery on 05/28/2023, and his wound was healing nicely, packing was discontinued and changed to Aquacel. Since yesterday, his stump wound began to become squishy, mildly red and more painful.  MRI left knee 06/01/23: 1. Cutaneous ulceration medial to the medial compartment with underlying subcutaneous fluid and infiltrative edema measuring about 6.2 by 0.6 by 7.1 cm. Most of this is infiltrative although a small component of abscess is difficult to exclude on today's noncontrast exam. I do not see substantial gas tracking in the soft tissues. 2. New abnormal marrow edema along the medial tibial plateau anteriorly, especially subcortically. This could represent early osteomyelitis given the proximity to this process and the lack of similar edema signal on 04/30/2023. 3. Substantial patellar tendinopathy similar prior.  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):  X-ray of left   knee is negative for osteomyelitis.  On examination, patient has mild fluctuation, cannot rule out abscess.  Will get an MRI of left knee for further evaluation.   --Consulted Dr. Lenell Antu of [facility]-- given MRI results were discussed with patient and he prefers to continue IV antibiotic. Patient understands he may need antibiotic for few weeks -- Will get infectious disease consultation - Empiric antimicrobial treatment with vancomycin, cefepime - PRN Zofran for nausea -Pain control: Continue home as needed Norco, oral Dilaudid, IV as needed morphine, Tylenol - Blood cultures x 2 --negative -- wound care consult --7/14-- no new complaints. CBG 68. Wondering when he can go home --1/2 BC GNR. Dr Rivka Safer to see today   Diabetic neuropathy,  type I diabetes mellitus (HCC): Recent A1c 12.2, poorly controlled.   -Continue insulin pump   Essential hypertension -IV hydralazine as needed -Lotensin   Chronic kidney disease, stage 3a (HCC): Stable.  Recent baseline creatinine 1.5-1.8.   --creat down to 1.58  HLD (hyperlipidemia) -Lipitor   Tobacco abuse -Routine patch   Chronic pain syndrome -Current continue home as needed Norco, oral Dilaudid       DVT ppx: SQ Heparin    Code Status: Full code   Family Communication:  no family member is at bed side.   Disposition Plan:  Anticipate discharge back to previous environment  Consults called: ID and vascular surgery Level of care: Med-Surg Status is: Inpatient Remains inpatient appropriate because: Infection of amputation stump    TOTAL TIME TAKING CARE OF THIS PATIENT: 35 minutes.  >50% time spent on counselling and coordination of care  Note: This dictation was prepared with Dragon dictation along with smaller phrase technology. Any transcriptional errors that result from this process are unintentional.  Enedina Finner M.D    [facility]   CC: Primary care physician; Barbette Reichmann, MD

## 2023-06-03 NOTE — Plan of Care (Signed)
  Problem: Education: Goal: Knowledge of General Education information will improve Description: Including pain rating scale, medication(s)/side effects and non-pharmacologic comfort measures Outcome: Progressing   Problem: Health Behavior/Discharge Planning: Goal: Ability to manage health-related needs will improve Outcome: Progressing   Problem: Clinical Measurements: Goal: Ability to maintain clinical measurements within normal limits will improve Outcome: Progressing Goal: Diagnostic test results will improve Outcome: Progressing Goal: Cardiovascular complication will be avoided Outcome: Progressing   Problem: Activity: Goal: Risk for activity intolerance will decrease Outcome: Progressing   Problem: Nutrition: Goal: Adequate nutrition will be maintained Outcome: Progressing   Problem: Coping: Goal: Level of anxiety will decrease Outcome: Progressing   Problem: Elimination: Goal: Will not experience complications related to bowel motility Outcome: Progressing Goal: Will not experience complications related to urinary retention Outcome: Progressing   Problem: Pain Managment: Goal: General experience of comfort will improve Outcome: Progressing   

## 2023-06-03 NOTE — Discharge Summary (Signed)
Physician Discharge Summary   Patient: Richard Mendoza MRN: [id number] DOB: [birth year]  Admit date:     06/01/2023  Discharge date: 06/03/23  Discharge Physician: Enedina Finner   PCP: Barbette Reichmann, MD   Recommendations at discharge:    F/u Dr Dew/Fallon Manson Passey, NP in 1-2 weeks F/u PCP in 1-2 weeks F/u Dr Rivka Safer in 2 weeks  Discharge Diagnoses: Principal Problem:   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) Active Problems:   Diabetic neuropathy, type I diabetes mellitus (HCC)   Essential hypertension   Chronic kidney disease, stage 3a (HCC)   HLD (hyperlipidemia)   Tobacco abuse   Chronic pain syndrome    Richard Mendoza is a 47 y.o. male with medical history significant of necrotizing fasciitis status post left BKA, DM-type I on insulin pump, hypertension, chronic pain syndrome, CKD-3a, HLD, tobacco abuse, who presents with wound infection in left BKA stump.   Pt was recently hospitalized from 6/10 - 6/14 due to wound infection in the left BKA stump. MRI showed an abscess adjacent to medial femoral condyle. I&D was performed by Dr. Wyn Quaker of [facility]. Pt was discharged home with packing/dressing changes and antibiotics x 1 week. He had recent follow-up with vascular surgery on 05/28/2023, and his wound was healing nicely, packing was discontinued and changed to Aquacel. Since yesterday, his stump wound began to become squishy, mildly red and more painful.   MRI left knee 06/01/23: 1. Cutaneous ulceration medial to the medial compartment with underlying subcutaneous fluid and infiltrative edema measuring about 6.2 by 0.6 by 7.1 cm. Most of this is infiltrative although a small component of abscess is difficult to exclude on today's noncontrast exam. I do not see substantial gas tracking in the soft tissues. 2. New abnormal marrow edema along the medial tibial plateau anteriorly, especially subcortically. This could represent early osteomyelitis given the proximity to  this process and the lack of similar edema signal on 04/30/2023. 3. Substantial patellar tendinopathy similar prior.   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):  X-ray of left   knee is negative for osteomyelitis.  On examination, patient has mild fluctuation, cannot rule out abscess.  Will get an MRI of left knee for further evaluation.   --Consulted Dr. Lenell Antu of [facility]-- given MRI results were discussed with patient and he prefers to continue IV antibiotic. Patient understands he may need antibiotic for few weeks -- Will get infectious disease consultation - Empiric antimicrobial treatment with vancomycin, cefepime - PRN Zofran for nausea -Pain control: Continue home as needed Norco, oral Dilaudid, IV as needed morphine, Tylenol - Blood cultures x 2 --negative -- wound care consult --7/14-- no new complaints. CBG 68. Wondering when he can go home --7/15--1/2 BC GNR. Dr Rivka Safer to see today--po linezolid for 10 days. Pt got the rx thru [facility] pharmacy --he is ok to go home--cont dressing changes as instructed   Diabetic neuropathy, type I diabetes mellitus (HCC): Recent A1c 12.2, poorly controlled.   -Continue insulin pump   Essential hypertension -IV hydralazine as needed -Lotensin   Chronic kidney disease, stage 3a (HCC): Stable.  Recent baseline creatinine 1.5-1.8.   --creat down to 1.58   HLD (hyperlipidemia) -Lipitor   Tobacco abuse -Routine patch   Chronic pain syndrome -Current continue home as needed Norco, oral Dilaudid  D/c home today.pt agreeable       DVT ppx: SQ Heparin    Code Status: Full code   Family Communication:  no family member is  at bed side.   Disposition Plan:  Anticipate discharge back to previous environment  Consults called: ID and vascular surgery     Pain control - [place] Controlled Substance Reporting System database was reviewed. and patient was instructed, not to drive, operate heavy machinery, perform  activities at heights, swimming or participation in water activities or provide baby-sitting services while on Pain, Sleep and Anxiety Medications; until their outpatient Physician has advised to do so again. Also recommended to not to take more than prescribed Pain, Sleep and Anxiety Medications.  Disposition: Home Diet recommendation:  Discharge Diet Orders (From admission, onward)     Start     Ordered   06/03/23 0000  Diet - low sodium heart healthy        06/03/23 1732           Cardiac and Carb modified diet DISCHARGE MEDICATION: Allergies as of 06/03/2023   No Known Allergies      Medication List     TAKE these medications    atorvastatin 40 MG tablet Commonly known as: LIPITOR Take 40 mg by mouth daily.   benazepril 20 MG tablet Commonly known as: LOTENSIN Take 20 mg by mouth daily.   [facility] Senna 8.6 MG tablet Generic drug: senna Take 1 tablet by mouth 2 (two) times daily as needed for constipation.   Fiasp 100 UNIT/ML Soln Generic drug: Insulin Aspart (w/Niacinamide) Inject into the skin continuous. Via pump   HYDROcodone-acetaminophen 10-325 MG tablet Commonly known as: NORCO Take 1 tablet by mouth every 6 (six) hours as needed for moderate pain or severe pain.   HYDROmorphone 2 MG tablet Commonly known as: DILAUDID Take 2 mg by mouth every 8 (eight) hours.   linezolid 600 MG tablet Commonly known as: ZYVOX Take 1 tablet (600 mg total) by mouth 2 (two) times daily.               Discharge Care Instructions  (From admission, onward)           Start     Ordered   06/03/23 0000  Discharge wound care:       Comments: 06/03/23 0500    Wound care  Daily      Comments: Wound care to left medial stump wound, full thickness: Cleanse with NS, pat dry. Cover with size appropriate piece of xeroform gauze [employer] [place] # [id number]), top with dry gauze and secure with silicone foam. Change daily.  06/02/23 1022   06/03/23 1732            Follow-up  Information     Barbette Reichmann, MD. Schedule an appointment as soon as possible for a visit in 1 week(s).   Specialty: Internal Medicine Contact information: [address] [place] [address] [phone]         Georgiana Spinner, NP. Schedule an appointment as soon as possible for a visit in 10 day(s).   Specialty: Vascular Surgery Why: left knee infection Contact information: [address] [place] [address] [phone]         Lynn Ito, MD Follow up in 2 week(s).   Specialty: Infectious Diseases Contact information: [address] [place] [address] [phone]                Discharge Exam: Ceasar Mons Weights   06/01/23 0012  Weight: 88 kg   GENERAL:  47 y.o.-year-old patient with no acute distress.  LUNGS: Normal breath sounds bilaterally,  no wheezing CARDIOVASCULAR: S1, S2 normal. No murmur   ABDOMEN: Soft, nontender, nondistended. Bowel sounds present.  EXTREMITIES: left amputation stump    Condition at discharge: fair  The results of significant diagnostics from this hospitalization (including imaging, microbiology, ancillary and laboratory) are listed below for reference.   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 EXAM: MRI OF THE LEFT KNEE WITHOUT CONTRAST TECHNIQUE: Multiplanar, multisequence MR imaging of the knee was performed. No intravenous contrast was administered. COMPARISON:  04/30/2023 FINDINGS: Today's exam expanded the field of view to include the stump, including more region at the expense of spatial resolution. MENISCI Medial meniscus:  Unremarkable Lateral meniscus:  Unremarkable LIGAMENTS Cruciates:  Unremarkable Collaterals:  Unremarkable CARTILAGE Patellofemoral:  Unremarkable Medial:  Unremarkable Lateral:  Unremarkable Joint:  Upper normal amount  of fluid in the knee joint. Popliteal Fossa:  Unremarkable Extensor Mechanism: Substantial patellar tendinopathy similar prior. Bones: Abnormal marrow edema along the medial tibial plateau anteriorly, especially subcortically Other: Cutaneous ulceration medial to the medial compartment on image 14 series 23 with underlying subcutaneous fluid and infiltrative edema measuring about 6.2 by 0.6 by 7.1 cm. Most of this is infiltrative although a small component of abscess is difficult to exclude on today's noncontrast exam. I do not see substantial gas tracking in the soft tissues. Some of this edema is adjacent to the posterior portion of the medial patellar retinaculum and medial patellofemoral ligament, and also adjacent to the upper part of the pes anserinus and MCL. It is not an conceivable that the edema signal in the proximal tibial plateau anteriorly could represent early infection given the proximity to this process and the lack of similar edema signal on 04/30/2023. IMPRESSION: 1. Cutaneous ulceration medial to the medial compartment with underlying subcutaneous fluid and infiltrative edema measuring about 6.2 by 0.6 by 7.1 cm. Most of this is infiltrative although a small component of abscess is difficult to exclude on today's noncontrast exam. I do not see substantial gas tracking in the soft tissues. 2. New abnormal marrow edema along the medial tibial plateau anteriorly, especially subcortically. This could represent early osteomyelitis given the proximity to this process and the lack of similar edema signal on 04/30/2023. 3. Substantial patellar tendinopathy similar prior. Electronically Signed   By: Gaylyn Rong M.D.   On: 06/01/2023 16:38   DG Knee Complete 4 Views Left  Result Date: 06/01/2023 CLINICAL DATA:  Postoperative infection, medial left knee wound EXAM: LEFT KNEE - COMPLETE 4+ VIEW COMPARISON:  None Available. FINDINGS: Surgical changes of left below-knee amputation are seen. No acute  fracture or dislocation. No osseous erosion identified to suggest osteomyelitis. There is soft tissue swelling and superficial ulceration involving the terminal soft tissues of the lower extremity stump. No subcutaneous gas or retained radiopaque foreign body. There is soft tissue swelling seen medial to the left knee joint space. No effusion. IMPRESSION: 1. Soft tissue swelling and superficial ulceration involving the terminal soft tissues of the lower extremity stump. No radiographic evidence of osteomyelitis. Electronically Signed   By: Helyn Numbers M.D.   On: 06/01/2023 03:05    Microbiology: Results for orders placed or performed during the hospital encounter of 06/01/23  Culture, blood (routine x 2)     Status: None (Preliminary result)   Collection Time: 06/01/23  3:17 AM   Specimen: Right Antecubital; Blood  Result Value Ref Range Status   Specimen  Description RIGHT ANTECUBITAL  Final   Special Requests   Final    BOTTLES DRAWN AEROBIC AND ANAEROBIC Blood Culture adequate volume   Culture   Final    NO GROWTH 2 DAYS Performed at [facility], [address] [place], [place] [address]    Report Status PENDING  Incomplete  Culture, blood (routine x 2)     Status: Abnormal   Collection Time: 06/01/23  3:17 AM   Specimen: BLOOD RIGHT FOREARM  Result Value Ref Range Status   Specimen Description   Final    BLOOD RIGHT FOREARM Performed at [facility], [address] [place], [place] [address]    Special Requests   Final    BOTTLES DRAWN AEROBIC AND ANAEROBIC Blood Culture adequate volume Performed at [facility], [address], [place], [place] [address]    Culture  Setup Time   Final    GRAM POSITIVE RODS AEROBIC BOTTLE ONLY Organism ID to follow CORRECTED RESULTS PREVIOUSLY REPORTED AS: GRAM NEGATIVE RODS CORRECTED RESULTS CALLED TO: C. CHILDS PHARMD, AT 1457 06/03/23 D. VANHOOK    Culture (A)  Final    BACILLUS SPECIES Standardized  susceptibility testing for this organism is not available. Performed at [facility], [address], [place], [place] [address]    Report Status 06/03/2023 FINAL  Final  Blood Culture ID Panel (Reflexed)     Status: None   Collection Time: 06/01/23  3:17 AM  Result Value Ref Range Status   Enterococcus faecalis NOT DETECTED NOT DETECTED Final   Enterococcus Faecium NOT DETECTED NOT DETECTED Final   Listeria monocytogenes NOT DETECTED NOT DETECTED Final   Staphylococcus species NOT DETECTED NOT DETECTED Final   Staphylococcus aureus (BCID) NOT DETECTED NOT DETECTED Final   Staphylococcus epidermidis NOT DETECTED NOT DETECTED Final   Staphylococcus lugdunensis NOT DETECTED NOT DETECTED Final   Streptococcus species NOT DETECTED NOT DETECTED Final   Streptococcus agalactiae NOT DETECTED NOT DETECTED Final   Streptococcus pneumoniae NOT DETECTED NOT DETECTED Final   Streptococcus pyogenes NOT DETECTED NOT DETECTED Final   A.calcoaceticus-baumannii NOT DETECTED NOT DETECTED Final   Bacteroides fragilis NOT DETECTED NOT DETECTED Final   Enterobacterales NOT DETECTED NOT DETECTED Final   Enterobacter cloacae complex NOT DETECTED NOT DETECTED Final   Escherichia coli NOT DETECTED NOT DETECTED Final   Klebsiella aerogenes NOT DETECTED NOT DETECTED Final   Klebsiella oxytoca NOT DETECTED NOT DETECTED Final   Klebsiella pneumoniae NOT DETECTED NOT DETECTED Final   Proteus species NOT DETECTED NOT DETECTED Final   Salmonella species NOT DETECTED NOT DETECTED Final   Serratia marcescens NOT DETECTED NOT DETECTED Final   Haemophilus influenzae NOT DETECTED NOT DETECTED Final   Neisseria meningitidis NOT DETECTED NOT DETECTED Final   Pseudomonas aeruginosa NOT DETECTED NOT DETECTED Final   Stenotrophomonas maltophilia NOT DETECTED NOT DETECTED Final   Candida albicans NOT DETECTED NOT DETECTED Final   Candida auris NOT DETECTED NOT DETECTED Final   Candida glabrata NOT DETECTED NOT  DETECTED Final   Candida krusei NOT DETECTED NOT DETECTED Final   Candida parapsilosis NOT DETECTED NOT DETECTED Final   Candida tropicalis NOT DETECTED NOT DETECTED Final   Cryptococcus neoformans/gattii NOT DETECTED NOT DETECTED Final    Comment: Performed at [facility], [address] [place], [place] [address]    Labs: CBC: Recent Labs  Lab 06/01/23 0014 06/02/23 0348  WBC 10.7* 7.7  NEUTROABS 7.6  --   HGB 13.4 11.1*  HCT 38.7* 32.9*  MCV 81.1 82.3  PLT 278 206   Basic Metabolic Panel: Recent Labs  Lab 06/01/23 0014 06/01/23 0611 06/02/23 0348  NA 134*  --  136  K 2.9*  --  3.1*  CL 100  --  110  CO2 24  --  21*  GLUCOSE 261*  --  146*  BUN 21*  --  17  CREATININE 1.68*  --  1.58*  CALCIUM 9.1  --  8.4*  MG  --  2.2  --    Liver Function Tests: Recent Labs  Lab 06/01/23 0014  AST 16  ALT 12  ALKPHOS 154*  BILITOT 0.5  PROT 7.5  ALBUMIN 3.3*   CBG: Recent Labs  Lab 06/02/23 2127 06/03/23 0229 06/03/23 0758 06/03/23 1125 06/03/23 1604  GLUCAP 187* 223* 64* 166* 134*    Discharge time spent: greater than 30 minutes.  Signed: Enedina Finner, MD [facility] 06/03/2023

## 2023-06-04 DIAGNOSIS — Z981 Arthrodesis status: Secondary | ICD-10-CM | POA: Diagnosis not present

## 2023-06-04 DIAGNOSIS — G546 Phantom limb syndrome with pain: Secondary | ICD-10-CM | POA: Diagnosis not present

## 2023-06-04 DIAGNOSIS — M5412 Radiculopathy, cervical region: Secondary | ICD-10-CM | POA: Diagnosis not present

## 2023-06-04 DIAGNOSIS — M25511 Pain in right shoulder: Secondary | ICD-10-CM | POA: Diagnosis not present

## 2023-06-04 DIAGNOSIS — Z79891 Long term (current) use of opiate analgesic: Secondary | ICD-10-CM | POA: Diagnosis not present

## 2023-06-04 DIAGNOSIS — M5020 Other cervical disc displacement, unspecified cervical region: Secondary | ICD-10-CM | POA: Diagnosis not present

## 2023-06-04 DIAGNOSIS — G894 Chronic pain syndrome: Secondary | ICD-10-CM | POA: Diagnosis not present

## 2023-06-04 DIAGNOSIS — Z79899 Other long term (current) drug therapy: Secondary | ICD-10-CM | POA: Diagnosis not present

## 2023-06-06 LAB — CULTURE, BLOOD (ROUTINE X 2)
Culture: NO GROWTH
Special Requests: ADEQUATE

## 2023-06-06 LAB — AEROBIC CULTURE W GRAM STAIN (SUPERFICIAL SPECIMEN)

## 2023-06-13 ENCOUNTER — Inpatient Hospital Stay: Payer: Medicaid Other | Admitting: Infectious Diseases

## 2023-06-25 ENCOUNTER — Ambulatory Visit (INDEPENDENT_AMBULATORY_CARE_PROVIDER_SITE_OTHER): Payer: Medicaid Other | Admitting: Vascular Surgery

## 2023-07-03 DIAGNOSIS — R269 Unspecified abnormalities of gait and mobility: Secondary | ICD-10-CM | POA: Diagnosis not present

## 2023-07-21 DIAGNOSIS — R269 Unspecified abnormalities of gait and mobility: Secondary | ICD-10-CM | POA: Diagnosis not present

## 2023-07-30 ENCOUNTER — Telehealth (INDEPENDENT_AMBULATORY_CARE_PROVIDER_SITE_OTHER): Payer: Self-pay

## 2023-07-30 NOTE — Telephone Encounter (Signed)
Patient called stating he don't know what he did to his below knee amputation but he is unable to put pressure or barely stand on it for the last 3 days. He stated It has a lot of soreness. He was wondering if could come in and have Richard Mendoza look at or get any ultrasound if needed.   Please advise

## 2023-07-30 NOTE — Telephone Encounter (Signed)
LVM for pt TCB and schedule appt.

## 2023-07-30 NOTE — Telephone Encounter (Signed)
He can come in and we can look and see

## 2023-08-01 NOTE — Telephone Encounter (Signed)
No just a visit is fine

## 2023-08-01 NOTE — Telephone Encounter (Signed)
Richard Mendoza did you just want a visit and no [place] ??? I only did OV because I didn't see anything in the notes to sch [place] too

## 2023-08-02 ENCOUNTER — Ambulatory Visit (INDEPENDENT_AMBULATORY_CARE_PROVIDER_SITE_OTHER): Payer: Medicaid Other | Admitting: Vascular Surgery

## 2023-08-03 DIAGNOSIS — R269 Unspecified abnormalities of gait and mobility: Secondary | ICD-10-CM | POA: Diagnosis not present

## 2023-08-20 ENCOUNTER — Emergency Department: Payer: Medicaid Other

## 2023-08-20 ENCOUNTER — Inpatient Hospital Stay
Admission: EM | Admit: 2023-08-20 | Discharge: 2023-08-30 | DRG: 565 | Disposition: A | Discharge status: Home Health | Payer: Medicaid Other | Attending: Internal Medicine | Admitting: Internal Medicine

## 2023-08-20 ENCOUNTER — Inpatient Hospital Stay: Payer: Medicaid Other

## 2023-08-20 DIAGNOSIS — E1122 Type 2 diabetes mellitus with diabetic chronic kidney disease: Secondary | ICD-10-CM | POA: Diagnosis present

## 2023-08-20 DIAGNOSIS — B951 Streptococcus, group B, as the cause of diseases classified elsewhere: Secondary | ICD-10-CM | POA: Diagnosis present

## 2023-08-20 DIAGNOSIS — E11649 Type 2 diabetes mellitus with hypoglycemia without coma: Secondary | ICD-10-CM | POA: Diagnosis not present

## 2023-08-20 DIAGNOSIS — A419 Sepsis, unspecified organism: Secondary | ICD-10-CM

## 2023-08-20 DIAGNOSIS — L03116 Cellulitis of left lower limb: Principal | ICD-10-CM

## 2023-08-20 DIAGNOSIS — Z91199 Patient's noncompliance with other medical treatment and regimen due to unspecified reason: Secondary | ICD-10-CM

## 2023-08-20 DIAGNOSIS — T8744 Infection of amputation stump, left lower extremity: Secondary | ICD-10-CM | POA: Diagnosis not present

## 2023-08-20 DIAGNOSIS — D638 Anemia in other chronic diseases classified elsewhere: Secondary | ICD-10-CM | POA: Diagnosis present

## 2023-08-20 DIAGNOSIS — M869 Osteomyelitis, unspecified: Secondary | ICD-10-CM | POA: Diagnosis not present

## 2023-08-20 DIAGNOSIS — E876 Hypokalemia: Secondary | ICD-10-CM | POA: Diagnosis not present

## 2023-08-20 DIAGNOSIS — N1831 Chronic kidney disease, stage 3a: Secondary | ICD-10-CM | POA: Diagnosis not present

## 2023-08-20 DIAGNOSIS — N179 Acute kidney failure, unspecified: Secondary | ICD-10-CM | POA: Diagnosis present

## 2023-08-20 DIAGNOSIS — Z794 Long term (current) use of insulin: Secondary | ICD-10-CM

## 2023-08-20 DIAGNOSIS — L97929 Non-pressure chronic ulcer of unspecified part of left lower leg with unspecified severity: Secondary | ICD-10-CM | POA: Diagnosis present

## 2023-08-20 DIAGNOSIS — E785 Hyperlipidemia, unspecified: Secondary | ICD-10-CM | POA: Diagnosis present

## 2023-08-20 DIAGNOSIS — R7881 Bacteremia: Secondary | ICD-10-CM

## 2023-08-20 DIAGNOSIS — E1165 Type 2 diabetes mellitus with hyperglycemia: Secondary | ICD-10-CM | POA: Diagnosis present

## 2023-08-20 DIAGNOSIS — Z79899 Other long term (current) drug therapy: Secondary | ICD-10-CM

## 2023-08-20 DIAGNOSIS — Z981 Arthrodesis status: Secondary | ICD-10-CM

## 2023-08-20 DIAGNOSIS — B954 Other streptococcus as the cause of diseases classified elsewhere: Secondary | ICD-10-CM | POA: Diagnosis present

## 2023-08-20 DIAGNOSIS — E871 Hypo-osmolality and hyponatremia: Secondary | ICD-10-CM | POA: Diagnosis not present

## 2023-08-20 DIAGNOSIS — G8929 Other chronic pain: Secondary | ICD-10-CM | POA: Diagnosis present

## 2023-08-20 DIAGNOSIS — E872 Acidosis, unspecified: Secondary | ICD-10-CM | POA: Diagnosis present

## 2023-08-20 DIAGNOSIS — D509 Iron deficiency anemia, unspecified: Secondary | ICD-10-CM | POA: Diagnosis not present

## 2023-08-20 DIAGNOSIS — K219 Gastro-esophageal reflux disease without esophagitis: Secondary | ICD-10-CM | POA: Diagnosis present

## 2023-08-20 DIAGNOSIS — I10 Essential (primary) hypertension: Secondary | ICD-10-CM | POA: Diagnosis not present

## 2023-08-20 DIAGNOSIS — D631 Anemia in chronic kidney disease: Secondary | ICD-10-CM | POA: Diagnosis present

## 2023-08-20 DIAGNOSIS — I38 Endocarditis, valve unspecified: Secondary | ICD-10-CM | POA: Diagnosis not present

## 2023-08-20 DIAGNOSIS — M25562 Pain in left knee: Secondary | ICD-10-CM | POA: Diagnosis not present

## 2023-08-20 DIAGNOSIS — T8740 Infection of amputation stump, unspecified extremity: Secondary | ICD-10-CM | POA: Diagnosis not present

## 2023-08-20 DIAGNOSIS — B9562 Methicillin resistant Staphylococcus aureus infection as the cause of diseases classified elsewhere: Secondary | ICD-10-CM | POA: Diagnosis not present

## 2023-08-20 DIAGNOSIS — E1169 Type 2 diabetes mellitus with other specified complication: Secondary | ICD-10-CM | POA: Diagnosis not present

## 2023-08-20 DIAGNOSIS — Z720 Tobacco use: Secondary | ICD-10-CM | POA: Diagnosis present

## 2023-08-20 DIAGNOSIS — Z833 Family history of diabetes mellitus: Secondary | ICD-10-CM

## 2023-08-20 DIAGNOSIS — N189 Chronic kidney disease, unspecified: Secondary | ICD-10-CM | POA: Diagnosis present

## 2023-08-20 DIAGNOSIS — L02416 Cutaneous abscess of left lower limb: Secondary | ICD-10-CM | POA: Diagnosis present

## 2023-08-20 DIAGNOSIS — Z792 Long term (current) use of antibiotics: Secondary | ICD-10-CM

## 2023-08-20 DIAGNOSIS — E861 Hypovolemia: Secondary | ICD-10-CM | POA: Diagnosis not present

## 2023-08-20 DIAGNOSIS — M75101 Unspecified rotator cuff tear or rupture of right shoulder, not specified as traumatic: Secondary | ICD-10-CM | POA: Diagnosis present

## 2023-08-20 DIAGNOSIS — M00011 Staphylococcal arthritis, right shoulder: Secondary | ICD-10-CM | POA: Diagnosis present

## 2023-08-20 DIAGNOSIS — I129 Hypertensive chronic kidney disease with stage 1 through stage 4 chronic kidney disease, or unspecified chronic kidney disease: Secondary | ICD-10-CM | POA: Diagnosis present

## 2023-08-20 DIAGNOSIS — E663 Overweight: Secondary | ICD-10-CM | POA: Insufficient documentation

## 2023-08-20 DIAGNOSIS — M7551 Bursitis of right shoulder: Secondary | ICD-10-CM | POA: Insufficient documentation

## 2023-08-20 DIAGNOSIS — Z885 Allergy status to narcotic agent status: Secondary | ICD-10-CM

## 2023-08-20 DIAGNOSIS — M86162 Other acute osteomyelitis, left tibia and fibula: Secondary | ICD-10-CM | POA: Diagnosis not present

## 2023-08-20 DIAGNOSIS — Z716 Tobacco abuse counseling: Secondary | ICD-10-CM

## 2023-08-20 DIAGNOSIS — R739 Hyperglycemia, unspecified: Secondary | ICD-10-CM | POA: Diagnosis not present

## 2023-08-20 DIAGNOSIS — Y835 Amputation of limb(s) as the cause of abnormal reaction of the patient, or of later complication, without mention of misadventure at the time of the procedure: Secondary | ICD-10-CM | POA: Diagnosis present

## 2023-08-20 DIAGNOSIS — R269 Unspecified abnormalities of gait and mobility: Secondary | ICD-10-CM | POA: Diagnosis not present

## 2023-08-20 DIAGNOSIS — F17210 Nicotine dependence, cigarettes, uncomplicated: Secondary | ICD-10-CM | POA: Diagnosis present

## 2023-08-20 DIAGNOSIS — B9561 Methicillin susceptible Staphylococcus aureus infection as the cause of diseases classified elsewhere: Secondary | ICD-10-CM

## 2023-08-20 DIAGNOSIS — Z8249 Family history of ischemic heart disease and other diseases of the circulatory system: Secondary | ICD-10-CM

## 2023-08-20 DIAGNOSIS — M65811 Other synovitis and tenosynovitis, right shoulder: Secondary | ICD-10-CM | POA: Diagnosis present

## 2023-08-20 LAB — BASIC METABOLIC PANEL
Anion gap: 11 (ref 5–15)
Anion gap: 12 (ref 5–15)
Anion gap: 10 (ref 5–15)
BUN: 32 mg/dL — ABNORMAL HIGH (ref 6–20)
BUN: 33 mg/dL — ABNORMAL HIGH (ref 6–20)
BUN: 32 mg/dL — ABNORMAL HIGH (ref 6–20)
CO2: 18 mmol/L — ABNORMAL LOW (ref 22–32)
CO2: 19 mmol/L — ABNORMAL LOW (ref 22–32)
CO2: 18 mmol/L — ABNORMAL LOW (ref 22–32)
Calcium: 7.9 mg/dL — ABNORMAL LOW (ref 8.9–10.3)
Calcium: 8 mg/dL — ABNORMAL LOW (ref 8.9–10.3)
Calcium: 7.9 mg/dL — ABNORMAL LOW (ref 8.9–10.3)
Chloride: 98 mmol/L (ref 98–111)
Chloride: 98 mmol/L (ref 98–111)
Chloride: 101 mmol/L (ref 98–111)
Creatinine, Ser: 2.75 mg/dL — ABNORMAL HIGH (ref 0.61–1.24)
Creatinine, Ser: 2.63 mg/dL — ABNORMAL HIGH (ref 0.61–1.24)
Creatinine, Ser: 2.4 mg/dL — ABNORMAL HIGH (ref 0.61–1.24)
GFR, Estimated: 28 mL/min — ABNORMAL LOW (ref >60)
GFR, Estimated: 29 mL/min — ABNORMAL LOW (ref >60)
GFR, Estimated: 33 mL/min — ABNORMAL LOW (ref >60)
Glucose, Bld: 397 mg/dL — ABNORMAL HIGH (ref 70–99)
Glucose, Bld: 359 mg/dL — ABNORMAL HIGH (ref 70–99)
Glucose, Bld: 294 mg/dL — ABNORMAL HIGH (ref 70–99)
Potassium: 2.7 mmol/L — CL (ref 3.5–5.1)
Potassium: 3.3 mmol/L — ABNORMAL LOW (ref 3.5–5.1)
Potassium: 2.8 mmol/L — ABNORMAL LOW (ref 3.5–5.1)
Sodium: 127 mmol/L — ABNORMAL LOW (ref 135–145)
Sodium: 129 mmol/L — ABNORMAL LOW (ref 135–145)
Sodium: 129 mmol/L — ABNORMAL LOW (ref 135–145)

## 2023-08-20 LAB — BLOOD GAS, VENOUS
Acid-base deficit: 8.3 mmol/L — ABNORMAL HIGH (ref 0.0–2.0)
Bicarbonate: 18.4 mmol/L — ABNORMAL LOW (ref 20.0–28.0)
O2 Saturation: 67 %
Patient temperature: 37
pCO2, Ven: 41 mm[Hg] — ABNORMAL LOW (ref 44–60)
pH, Ven: 7.26 (ref 7.25–7.43)
pO2, Ven: 39 mm[Hg] (ref 32–45)

## 2023-08-20 LAB — CBC WITH DIFFERENTIAL/PLATELET
Abs Immature Granulocytes: 0.08 10*3/uL — ABNORMAL HIGH (ref 0.00–0.07)
Basophils Absolute: 0 10*3/uL (ref 0.0–0.1)
Basophils Relative: 0 %
Eosinophils Absolute: 0 10*3/uL (ref 0.0–0.5)
Eosinophils Relative: 0 %
HCT: 32.3 % — ABNORMAL LOW (ref 39.0–52.0)
Hemoglobin: 10.7 g/dL — ABNORMAL LOW (ref 13.0–17.0)
Immature Granulocytes: 1 %
Lymphocytes Relative: 3 %
Lymphs Abs: 0.4 10*3/uL — ABNORMAL LOW (ref 0.7–4.0)
MCH: 27 pg (ref 26.0–34.0)
MCHC: 33.1 g/dL (ref 30.0–36.0)
MCV: 81.6 fL (ref 80.0–100.0)
Monocytes Absolute: 0.2 10*3/uL (ref 0.1–1.0)
Monocytes Relative: 2 %
Neutro Abs: 13.7 10*3/uL — ABNORMAL HIGH (ref 1.7–7.7)
Neutrophils Relative %: 94 %
Platelets: 316 10*3/uL (ref 150–400)
RBC: 3.96 MIL/uL — ABNORMAL LOW (ref 4.22–5.81)
RDW: 14.3 % (ref 11.5–15.5)
WBC: 14.4 10*3/uL — ABNORMAL HIGH (ref 4.0–10.5)
nRBC: 0 % (ref 0.0–0.2)

## 2023-08-20 LAB — COMPREHENSIVE METABOLIC PANEL
ALT: 12 U/L (ref 0–44)
AST: 15 U/L (ref 15–41)
Albumin: 2.6 g/dL — ABNORMAL LOW (ref 3.5–5.0)
Alkaline Phosphatase: 105 U/L (ref 38–126)
Anion gap: 12 (ref 5–15)
BUN: 32 mg/dL — ABNORMAL HIGH (ref 6–20)
CO2: 19 mmol/L — ABNORMAL LOW (ref 22–32)
Calcium: 8.3 mg/dL — ABNORMAL LOW (ref 8.9–10.3)
Chloride: 94 mmol/L — ABNORMAL LOW (ref 98–111)
Creatinine, Ser: 2.89 mg/dL — ABNORMAL HIGH (ref 0.61–1.24)
GFR, Estimated: 26 mL/min — ABNORMAL LOW (ref >60)
Glucose, Bld: 513 mg/dL (ref 70–99)
Potassium: 2.3 mmol/L — CL (ref 3.5–5.1)
Sodium: 125 mmol/L — ABNORMAL LOW (ref 135–145)
Total Bilirubin: 0.6 mg/dL (ref 0.3–1.2)
Total Protein: 7.5 g/dL (ref 6.5–8.1)

## 2023-08-20 LAB — GLUCOSE, CAPILLARY
Glucose-Capillary: 366 mg/dL — ABNORMAL HIGH (ref 70–99)
Glucose-Capillary: 335 mg/dL — ABNORMAL HIGH (ref 70–99)
Glucose-Capillary: 285 mg/dL — ABNORMAL HIGH (ref 70–99)
Glucose-Capillary: 291 mg/dL — ABNORMAL HIGH (ref 70–99)
Glucose-Capillary: 276 mg/dL — ABNORMAL HIGH (ref 70–99)

## 2023-08-20 LAB — PREALBUMIN: Prealbumin: 8 mg/dL — ABNORMAL LOW (ref 18–38)

## 2023-08-20 LAB — BETA-HYDROXYBUTYRIC ACID: Beta-Hydroxybutyric Acid: 0.45 mmol/L — ABNORMAL HIGH (ref 0.05–0.27)

## 2023-08-20 LAB — SEDIMENTATION RATE: Sed Rate: >140 mm/h — ABNORMAL HIGH (ref 0–15)

## 2023-08-20 LAB — HEMOGLOBIN A1C
Hgb A1c MFr Bld: 13.2 % — ABNORMAL HIGH (ref 4.8–5.6)
Mean Plasma Glucose: 332.14 mg/dL

## 2023-08-20 LAB — OSMOLALITY: Osmolality: 299 mosm/kg — ABNORMAL HIGH (ref 275–295)

## 2023-08-20 LAB — MAGNESIUM: Magnesium: 2.3 mg/dL (ref 1.7–2.4)

## 2023-08-20 LAB — CBG MONITORING, ED: Glucose-Capillary: 442 mg/dL — ABNORMAL HIGH (ref 70–99)

## 2023-08-20 LAB — LACTIC ACID, PLASMA
Lactic Acid, Venous: 1.2 mmol/L (ref 0.5–1.9)
Lactic Acid, Venous: 1.4 mmol/L (ref 0.5–1.9)

## 2023-08-20 LAB — C-REACTIVE PROTEIN: CRP: 24.1 mg/dL — ABNORMAL HIGH (ref <1.0)

## 2023-08-20 LAB — MRSA NEXT GEN BY PCR, NASAL: MRSA by PCR Next Gen: DETECTED — AB

## 2023-08-20 MED ORDER — METRONIDAZOLE 500 MG/100ML IV SOLN
500.0000 mg | Freq: Two times a day (BID) | INTRAVENOUS | Status: DC
  Filled 2023-08-20: qty 100

## 2023-08-20 MED ORDER — CYCLOBENZAPRINE HCL 10 MG PO TABS
5.0000 mg | ORAL_TABLET | Freq: Two times a day (BID) | ORAL | Status: DC | PRN
  Administered 2023-08-20 – 2023-08-26 (×7): 5 mg via ORAL
  Filled 2023-08-20 (×8): qty 1

## 2023-08-20 MED ORDER — GADOBUTROL 1 MMOL/ML IV SOLN
7.5000 mL | Freq: Once | INTRAVENOUS | Status: AC | PRN
  Administered 2023-08-20: 7.5 mL via INTRAVENOUS

## 2023-08-20 MED ORDER — SODIUM CHLORIDE 0.9 % IV SOLN: 2.0000 g | Freq: Two times a day (BID) | INTRAVENOUS | Status: DC

## 2023-08-20 MED ORDER — ENOXAPARIN SODIUM 40 MG/0.4ML IJ SOSY
40.0000 mg | PREFILLED_SYRINGE | INTRAMUSCULAR | Status: DC
  Administered 2023-08-20 – 2023-08-26 (×6): 40 mg via SUBCUTANEOUS
  Filled 2023-08-20 (×7): qty 0.4

## 2023-08-20 MED ORDER — LIDOCAINE 5 % EX PTCH
1.0000 | MEDICATED_PATCH | CUTANEOUS | Status: DC
  Administered 2023-08-20 – 2023-08-26 (×7): 1 via TRANSDERMAL
  Filled 2023-08-20 (×8): qty 1

## 2023-08-20 MED ORDER — POTASSIUM CHLORIDE 10 MEQ/100ML IV SOLN: 10.0000 meq | INTRAVENOUS | Status: DC

## 2023-08-20 MED ORDER — VANCOMYCIN HCL IN DEXTROSE 1-5 GM/200ML-% IV SOLN
1000.0000 mg | Freq: Once | INTRAVENOUS | Status: AC
  Administered 2023-08-20: 1000 mg via INTRAVENOUS
  Filled 2023-08-20: qty 200

## 2023-08-20 MED ORDER — ADULT MULTIVITAMIN W/MINERALS CH
1.0000 | ORAL_TABLET | Freq: Every day | ORAL | Status: DC
  Administered 2023-08-21 – 2023-08-30 (×9): 1 via ORAL
  Filled 2023-08-20 (×9): qty 1

## 2023-08-20 MED ORDER — POTASSIUM CHLORIDE 2 MEQ/ML IV SOLN
INTRAVENOUS | Status: DC
Start: 2023-08-20 — End: 2023-08-20

## 2023-08-20 MED ORDER — LACTATED RINGERS IV BOLUS
1000.0000 mL | Freq: Once | INTRAVENOUS | Status: AC
  Administered 2023-08-20: 1000 mL via INTRAVENOUS

## 2023-08-20 MED ORDER — SODIUM CHLORIDE 0.9 % IV SOLN: INTRAVENOUS | Status: DC

## 2023-08-20 MED ORDER — MUPIROCIN 2 % EX OINT
1.0000 | TOPICAL_OINTMENT | Freq: Two times a day (BID) | CUTANEOUS | Status: AC
  Administered 2023-08-20 – 2023-08-24 (×9): 1 via NASAL
  Filled 2023-08-20 (×2): qty 22

## 2023-08-20 MED ORDER — DEXTROSE IN LACTATED RINGERS 5 % IV SOLN: INTRAVENOUS | Status: DC

## 2023-08-20 MED ORDER — METRONIDAZOLE 500 MG/100ML IV SOLN
500.0000 mg | Freq: Two times a day (BID) | INTRAVENOUS | Status: DC
  Administered 2023-08-20: 500 mg via INTRAVENOUS
  Filled 2023-08-20: qty 100

## 2023-08-20 MED ORDER — ZINC SULFATE 220 (50 ZN) MG PO CAPS
220.0000 mg | ORAL_CAPSULE | Freq: Every day | ORAL | Status: DC
  Administered 2023-08-21 – 2023-08-30 (×9): 220 mg via ORAL
  Filled 2023-08-20 (×9): qty 1

## 2023-08-20 MED ORDER — VITAMIN C 500 MG PO TABS
500.0000 mg | ORAL_TABLET | Freq: Two times a day (BID) | ORAL | Status: DC
  Administered 2023-08-20 – 2023-08-30 (×19): 500 mg via ORAL
  Filled 2023-08-20 (×19): qty 1

## 2023-08-20 MED ORDER — DEXTROSE 50 % IV SOLN: 0.0000 mL | INTRAVENOUS | Status: DC | PRN

## 2023-08-20 MED ORDER — LACTATED RINGERS IV SOLN: INTRAVENOUS | Status: DC

## 2023-08-20 MED ORDER — POTASSIUM CHLORIDE 10 MEQ/100ML IV SOLN
10.0000 meq | INTRAVENOUS | Status: AC
  Administered 2023-08-20 – 2023-08-21 (×4): 10 meq via INTRAVENOUS
  Filled 2023-08-20 (×4): qty 100

## 2023-08-20 MED ORDER — POTASSIUM CHLORIDE CRYS ER 20 MEQ PO TBCR
40.0000 meq | EXTENDED_RELEASE_TABLET | Freq: Once | ORAL | Status: AC
  Administered 2023-08-20: 40 meq via ORAL
  Filled 2023-08-20: qty 2

## 2023-08-20 MED ORDER — POTASSIUM CHLORIDE 20 MEQ PO PACK
40.0000 meq | PACK | Freq: Once | ORAL | Status: AC
  Administered 2023-08-20: 40 meq via ORAL
  Filled 2023-08-20: qty 2

## 2023-08-20 MED ORDER — ENSURE MAX PROTEIN PO LIQD
11.0000 [oz_av] | Freq: Two times a day (BID) | ORAL | Status: DC
  Administered 2023-08-22 – 2023-08-29 (×6): 11 [oz_av] via ORAL
  Filled 2023-08-20: qty 330

## 2023-08-20 MED ORDER — METRONIDAZOLE 500 MG/100ML IV SOLN: 500.0000 mg | Freq: Once | INTRAVENOUS | Status: DC

## 2023-08-20 MED ORDER — SODIUM CHLORIDE 0.9 % IV SOLN
2.0000 g | Freq: Two times a day (BID) | INTRAVENOUS | Status: DC
  Administered 2023-08-20: 2 g via INTRAVENOUS
  Filled 2023-08-20 (×2): qty 12.5

## 2023-08-20 MED ORDER — POTASSIUM CHLORIDE 10 MEQ/100ML IV SOLN
10.0000 meq | INTRAVENOUS | Status: AC
  Administered 2023-08-20 (×4): 10 meq via INTRAVENOUS
  Filled 2023-08-20 (×4): qty 100

## 2023-08-20 MED ORDER — HYDROMORPHONE HCL 1 MG/ML IJ SOLN
1.0000 mg | INTRAMUSCULAR | Status: DC | PRN
  Administered 2023-08-20 – 2023-08-21 (×5): 1 mg via INTRAVENOUS
  Filled 2023-08-20 (×5): qty 1

## 2023-08-20 MED ORDER — POTASSIUM CHLORIDE 10 MEQ/100ML IV SOLN
10.0000 meq | INTRAVENOUS | Status: AC
  Administered 2023-08-20 (×2): 10 meq via INTRAVENOUS
  Filled 2023-08-20 (×2): qty 100

## 2023-08-20 MED ORDER — NICOTINE 14 MG/24HR TD PT24
14.0000 mg | MEDICATED_PATCH | Freq: Every day | TRANSDERMAL | Status: DC
  Administered 2023-08-20 – 2023-08-30 (×11): 14 mg via TRANSDERMAL
  Filled 2023-08-20 (×12): qty 1

## 2023-08-20 MED ORDER — SODIUM CHLORIDE 0.9 % IV SOLN: 2.0000 g | Freq: Once | INTRAVENOUS | Status: DC

## 2023-08-20 MED ORDER — CHLORHEXIDINE GLUCONATE CLOTH 2 % EX PADS
6.0000 | MEDICATED_PAD | Freq: Every day | CUTANEOUS | Status: DC
  Administered 2023-08-20 – 2023-08-29 (×9): 6 via TOPICAL

## 2023-08-20 MED ORDER — ONDANSETRON HCL 4 MG PO TABS: 4.0000 mg | ORAL_TABLET | Freq: Four times a day (QID) | ORAL | Status: DC | PRN

## 2023-08-20 MED ORDER — VANCOMYCIN HCL IN DEXTROSE 1-5 GM/200ML-% IV SOLN: 1000.0000 mg | Freq: Once | INTRAVENOUS | Status: DC

## 2023-08-20 MED ORDER — INSULIN REGULAR(HUMAN) IN NACL 100-0.9 UT/100ML-% IV SOLN
INTRAVENOUS | Status: DC
  Administered 2023-08-20: 10 [IU]/h via INTRAVENOUS
  Administered 2023-08-20: 11 [IU]/h via INTRAVENOUS
  Filled 2023-08-20 (×2): qty 100

## 2023-08-20 MED ORDER — ATORVASTATIN CALCIUM 20 MG PO TABS
40.0000 mg | ORAL_TABLET | Freq: Every day | ORAL | Status: DC
  Administered 2023-08-21 – 2023-08-30 (×9): 40 mg via ORAL
  Filled 2023-08-20 (×9): qty 2

## 2023-08-20 MED ORDER — ONDANSETRON HCL 4 MG/2ML IJ SOLN
4.0000 mg | Freq: Four times a day (QID) | INTRAMUSCULAR | Status: DC | PRN
  Administered 2023-08-27: 4 mg via INTRAVENOUS

## 2023-08-20 MED ORDER — HYDROMORPHONE HCL 1 MG/ML IJ SOLN
1.0000 mg | Freq: Once | INTRAMUSCULAR | Status: AC
  Administered 2023-08-20: 1 mg via INTRAVENOUS
  Filled 2023-08-20: qty 1

## 2023-08-20 NOTE — ED Triage Notes (Signed)
Left BKA four years ago.  Has been having difficulty with current prosthetic and has had on ongoing pressure area to left stump.  States area started to drain small amounts last week, but today drainage has increased and also feeling pressure to stump and left knee.    1 com round area draining cloudy serous drainage.  DSD applied.

## 2023-08-20 NOTE — ED Notes (Addendum)
Dr Fuller Plan notified of potassium 2.3, glucose 513. Orders to be placed as needed

## 2023-08-20 NOTE — Consult Note (Signed)
Hospital Consult    Reason for Consult:  Left Stump infection  Requesting Physician:  Dr Doree Albee MD MRN #:  [id number]  History of Present Illness: This is a 47 y.o. male who presents to [facility] emergency department with left BKA draining infection.  On 05/02/2023 patient had incision and drainage of an abscess of the left lower extremity post his BKA.  He has been on multiple antibiotics.  Patient endorses today he came to the emergency room due to continued drainage and pain to that area.  Upon exam there is an open area of the bottom of his left stump left BKA.  Dressing had been just changed so no noticed drainage but patient and nurse both endorse he had large amount of drainage coming from this area.  Patient states the area will fill up with fluid and then just released.  He currently denies any chest pain shortness of breath dizziness or blurred vision.  He denies any recent fevers.  He does endorse pain to this area.  Vascular surgery was consulted to evaluate.  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;  Surgeon: Linus Galas, DPM;  Location: [facility];  Service: Podiatry;  Laterality: Left;   AMPUTATION Left 11/16/2020   Procedure: AMPUTATION BELOW KNEE;  Surgeon: Annice Needy, MD;  Location: [facility];  Service: Vascular;  Laterality: Left;   ANTERIOR CERVICAL DECOMP/DISCECTOMY FUSION N/A 09/09/2017   Procedure: ANTERIOR CERVICAL DECOMPRESSION/DISCECTOMY FUSION CERVICAL 6- CERVICAL 7;  Surgeon: Coletta Memos, MD;  [facility]: [facility];  Service: Neurosurgery;  Laterality: N/A;  ANTERIOR CERVICAL DECOMPRESSION/DISCECTOMY FUSION CERVICAL 6- CERVICAL 7   APPENDECTOMY     I & D EXTREMITY Right  10/03/2017   Procedure: IRRIGATION AND DEBRIDEMENT RIGHT WRIST;  Surgeon: Betha Loa, MD;  Location: [facility];  Service: Orthopedics;  Laterality: Right;   I & D EXTREMITY Right 11/26/2018   Procedure: IRRIGATION AND DEBRIDEMENT FASCIA ON RIGHT FOOT;  Surgeon: Gwyneth Revels, DPM;  Location: [facility];  Service: Podiatry;  Laterality: Right;   INCISION AND DRAINAGE Right 03/06/2019   Procedure: INCISION AND DRAINAGE RIGHT FOOT, WITH 4th RAY AMPUTATION;  Surgeon: Gwyneth Revels, DPM;  Location: [facility];  Service: Podiatry;  Laterality: Right;   INCISION AND DRAINAGE Left 11/07/2020   Procedure: INCISION AND DRAINAGE;  Surgeon: Linus Galas, DPM;  Location: [facility];  Service: Podiatry;  Laterality: Left;   INCISION AND DRAINAGE ABSCESS Left 05/02/2023   Procedure: INCISION AND DRAINAGE ABSCESS LEFT LOWER EXTREMITY;  Surgeon: Annice Needy, MD;  Location: [facility];  Service: Vascular;  Laterality: Left;   IRRIGATION AND DEBRIDEMENT FOOT Left 11/11/2020   Procedure: IRRIGATION AND DEBRIDEMENT FOOT;  Surgeon: Linus Galas, DPM;  Location: [facility];  Service: Podiatry;  Laterality: Left;   METATARSAL HEAD EXCISION Right 05/15/2019   Procedure: OSTECTOMY;MET HEAD 5;  Surgeon: Gwyneth Revels, DPM;  Location: [facility];  Service: Podiatry;  Laterality: Right;   osteomylitis     ROTATOR CUFF REPAIR Left     No Known Allergies  Prior to Admission medications   Medication Sig Start Date End Date Taking? Authorizing Provider  [facility] SENNA 8.6 MG tablet Take 1 tablet by mouth 2 (two) times daily as  needed for constipation. 10/31/17  Yes [provider]  FIASP 100 UNIT/ML SOLN Inject into the skin continuous. Via pump 10/09/20  Yes [provider]  HYDROcodone-acetaminophen (NORCO) 10-325 MG tablet Take 1 tablet by mouth every 6 (six) hours as needed for moderate pain or severe pain. 05/03/23  Yes Sunnie Nielsen, DO  HYDROmorphone (DILAUDID) 2 MG tablet Take 2 mg by mouth every 8 (eight) hours.  05/15/23  Yes [provider]  atorvastatin (LIPITOR) 40 MG tablet Take 40 mg by mouth daily. Patient not taking: Reported on 08/20/2023 08/23/17   [provider]  benazepril (LOTENSIN) 20 MG tablet Take 20 mg by mouth daily. Patient not taking: Reported on 08/20/2023 06/01/21   [provider]  linezolid (ZYVOX) 600 MG tablet Take 1 tablet (600 mg total) by mouth 2 (two) times daily. Patient not taking: Reported on 08/20/2023 06/03/23   Lynn Ito, MD    Social History   Socioeconomic History   Marital status: Married    Spouse name: Not on file   Number of children: 4   Years of education: Not on file   Highest education level: Not on file  Occupational History   Not on file  Tobacco Use   Smoking status: Some Days    Current packs/day: 0.00    Average packs/day: 1 pack/day for 17.0 years (17.0 ttl pk-yrs)    Types: Cigarettes    Start date: 01/14/2002    Last attempt to quit: 01/14/2019    Years since quitting: 4.6   Smokeless tobacco: Never  Vaping Use   Vaping status: Never Used  Substance and Sexual Activity   Alcohol use: Yes    Comment: rare   Drug use: No   Sexual activity: Yes  Other Topics Concern   Not on file  Social History Narrative   Not on file   Social Determinants of Health   Financial Resource Strain: Low Risk  (07/24/2022)   Received from [facility], [facility]   Overall Financial Resource Strain (CARDIA)    Difficulty of Paying Living Expenses: Not hard at all  Food Insecurity: No Food Insecurity (06/01/2023)   Hunger Vital Sign    Worried About Running Out of Food in the Last Year: Never true    Ran Out of Food in the Last Year: Never true  Transportation Needs: No Transportation Needs (06/01/2023)   PRAPARE - Administrator, Civil Service (Medical): No    Lack of Transportation (Non-Medical): No  Physical Activity: Not on file  Stress: No Stress Concern Present (07/24/2022)   Received from [facility], [facility] of Occupational Health - Occupational Stress Questionnaire    Feeling of Stress : Not at all  Social Connections: Unknown (03/21/2022)   Received from [facility], [facility]   Social Network    Social Network: Not on file  Intimate Partner Violence: Not At Risk (06/01/2023)   Humiliation, Afraid, Rape, and Kick questionnaire    Fear of Current or Ex-Partner: No    Emotionally Abused: No    Physically Abused: No    Sexually Abused: No     Family History  Problem Relation Age of Onset   Diabetes Mother    Heart disease Father    Diabetes Father    Arthritis Other    Hyperlipidemia Other    Hypertension Other    Cancer Other        breast   Mental illness Neg  Hx     ROS: Otherwise negative unless mentioned in HPI  Physical Examination  Vitals:   08/20/23 1100 08/20/23 1130  BP: 129/86 123/79  Pulse: 78 80  Resp:    Temp:    SpO2: 100% 100%   Body mass index is 27.84 kg/m.  General:  WDWN in NAD Gait: Not observed HENT: WNL, normocephalic Pulmonary: normal non-labored breathing, without Rales, rhonchi,  wheezing Cardiac: regular, without  Murmurs, rubs or gallops; without carotid bruits Abdomen: Positive bowel Sounds, soft, NT/ND, no masses Skin: without rashes Vascular Exam/Pulses: Unable to palpate pulse in right lower extremity and Left BKA with open ulcer with positive drainage.  Extremities: with ischemic changes, without Gangrene , with cellulitis; with open wounds;  Musculoskeletal: no muscle wasting or atrophy  Neurologic: A&O X 3;  No focal weakness or paresthesias are detected; speech is fluent/normal Psychiatric:  The pt has Normal affect. Lymph:  Unremarkable  CBC    Component Value Date/Time   WBC 14.4 (H) 08/20/2023 0902   RBC 3.96 (L) 08/20/2023 0902   HGB 10.7 (L) 08/20/2023 0902   HCT 32.3 (L) 08/20/2023 0902   PLT 316 08/20/2023 0902   MCV 81.6 08/20/2023 0902   MCH 27.0 08/20/2023 0902    MCHC 33.1 08/20/2023 0902   RDW 14.3 08/20/2023 0902   LYMPHSABS 0.4 (L) 08/20/2023 0902   MONOABS 0.2 08/20/2023 0902   EOSABS 0.0 08/20/2023 0902   BASOSABS 0.0 08/20/2023 0902    BMET    Component Value Date/Time   NA 125 (L) 08/20/2023 0902   K 2.3 (LL) 08/20/2023 0902   CL 94 (L) 08/20/2023 0902   CO2 19 (L) 08/20/2023 0902   GLUCOSE 513 (HH) 08/20/2023 0902   BUN 32 (H) 08/20/2023 0902   CREATININE 2.89 (H) 08/20/2023 0902   CALCIUM 8.3 (L) 08/20/2023 0902   GFRNONAA 26 (L) 08/20/2023 0902   GFRAA >60 05/05/2020 1930    COAGS: Lab Results  Component Value Date   INR 1.0 06/01/2023   INR 1.1 09/06/2021   INR 0.9 12/21/2020     Non-Invasive Vascular Imaging:   MRI Left Lower extremity Completed without results at this time.   Statin:  Yes.   Beta Blocker:  No. Aspirin:  No. ACEI:  Yes.   ARB:  No. CCB use:  No Other antiplatelets/anticoagulants:  No.    ASSESSMENT/PLAN: This is a 47 y.o. male who presents to [facility] emergency department with draining left BKA stump ulcer/wound.  Patient does not take care of himself.  Patient is known sugar today was over 500 upon admission.  This uncontrolled diabetes is most likely the cause of his continued infection to his left lower extremity and possible osteo myelitis.  Vascular surgery was consulted to evaluate.  PLAN: Vascular surgery recommends MRI of the left lower extremity rule out continuing osteomyelitis.  In the setting of osteomyelitis vascular surgery recommends left AKA to remove the infected bone.  Vascular surgery recommends strict diabetes mellitus care and the cessation of smoking immediately postoperatively in order for a left AKA to heal properly and stay healed.  In a detailed discussion at the bedside in ICU this afternoon the patient currently does not wish to entertain any surgical procedure to fix his draining left BKA ulcer.  He would prefer IV antibiotics.  I informed him I would be back to discussed  with him the options after the MRI was complete and read.  He agrees with this plan at this time.   -  I discussed the plan in detail with Dr. Festus Barren MD and he is in agreement with the plan   Marcie Bal [facility] 08/20/2023 12:27 PM

## 2023-08-20 NOTE — Assessment & Plan Note (Signed)
Hemoglobin stable at 10.7 Monitor

## 2023-08-20 NOTE — Assessment & Plan Note (Signed)
PPI  ? ?

## 2023-08-20 NOTE — Assessment & Plan Note (Signed)
Blood sugar in 500s with bicarb of 19 Urinalysis and VBG pending to correlate Will start on hyperglycemia protocol Beta hydroxybutyrate A1c Discussed the importance of blood sugar control at length with patient the bedside Monitor

## 2023-08-20 NOTE — H&P (Addendum)
History and Physical    Patient: Richard Mendoza XLK:[id number] DOB: [birth year] DOA: 08/20/2023 DOS: the patient was seen and examined on 08/20/2023 PCP: Barbette Reichmann, MD  Patient coming from: Home  Chief Complaint:  Chief Complaint  Patient presents with   Wound Infection   HPI: Richard Mendoza is a 47 y.o. male with medical history significant of uncontrolled type 2 diabetes, tobacco abuse, hypertension, hyperlipidemia, history of BKA presenting with diabetic stump infection.  Patient reports worsening drainage at the base of his stump over the past 1 to 2 weeks.  No fevers or chills.  No nausea or vomiting.  Noted admission July of this year for similar issues.  In review of documentation, patient deferred any potential AKA surgery.  Received extended course of IV antibiotics.  Noted blood culture 1 out of 2 with gram-negative rods.  Infectious disease consulted with patient having extended course of oral linezolid.  Patient reports compliance with linezolid.  Blood sugars have been in the 150s+ at home.  Still smoking 1/2 pack/day.  No reported alcohol use. Presented to the ER afebrile, hemodynamically stable.  Satting well on room air.  White count 14.4, hemoglobin 10.7, platelets 316, creatinine 2.9, glucose 513, bicarb 19, potassium 2.3.  Left knee plain films concerning for possible tibia stump osteomyelitis. Review of Systems: As mentioned in the history of present illness. All other systems reviewed and are negative. 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   Past Surgical History:  Procedure Laterality Date   AMPUTATION Left 11/07/2020   Procedure: 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;  Surgeon: Linus Galas, DPM;  Location: [facility];  Service: Podiatry;  Laterality: Left;   AMPUTATION Left 11/16/2020   Procedure:  AMPUTATION BELOW KNEE;  Surgeon: Annice Needy, MD;  Location: [facility];  Service: Vascular;  Laterality: Left;   ANTERIOR CERVICAL DECOMP/DISCECTOMY FUSION N/A 09/09/2017   Procedure: ANTERIOR CERVICAL DECOMPRESSION/DISCECTOMY FUSION CERVICAL 6- CERVICAL 7;  Surgeon: Coletta Memos, MD;  Location: [facility];  Service: Neurosurgery;  Laterality: N/A;  ANTERIOR CERVICAL DECOMPRESSION/DISCECTOMY FUSION CERVICAL 6- CERVICAL 7   APPENDECTOMY     I & D EXTREMITY Right 10/03/2017   Procedure: IRRIGATION AND DEBRIDEMENT RIGHT WRIST;  Surgeon: Betha Loa, MD;  Location: [facility];  Service: Orthopedics;  Laterality: Right;   I & D EXTREMITY Right 11/26/2018   Procedure: IRRIGATION AND DEBRIDEMENT FASCIA ON RIGHT FOOT;  Surgeon: Gwyneth Revels, DPM;  Location: [facility];  Service: Podiatry;  Laterality: Right;   INCISION AND DRAINAGE Right 03/06/2019   Procedure: INCISION AND DRAINAGE RIGHT FOOT, WITH 4th RAY AMPUTATION;  Surgeon: Gwyneth Revels, DPM;  Location: [facility];  Service: Podiatry;  Laterality: Right;   INCISION AND DRAINAGE Left 11/07/2020   Procedure: INCISION AND DRAINAGE;  Surgeon: Linus Galas, DPM;  Location: [facility];  Service: Podiatry;  Laterality: Left;   INCISION AND DRAINAGE ABSCESS Left 05/02/2023   Procedure: INCISION AND DRAINAGE ABSCESS LEFT LOWER EXTREMITY;  Surgeon: Annice Needy, MD;  Location: [facility];  Service: Vascular;  Laterality: Left;   IRRIGATION AND DEBRIDEMENT FOOT Left 11/11/2020   Procedure: IRRIGATION AND DEBRIDEMENT FOOT;  Surgeon: Linus Galas, DPM;  Location: [facility];  Service: Podiatry;  Laterality: Left;   METATARSAL HEAD EXCISION Right 05/15/2019   Procedure: OSTECTOMY;MET HEAD 5;  Surgeon: Gwyneth Revels, DPM;  Location: [facility];  Service: Podiatry;  Laterality: Right;   osteomylitis     ROTATOR CUFF REPAIR Left    Social History:  reports that he has been smoking cigarettes. He started smoking about 21 years ago. He has a 17 pack-year smoking history. He has never used  smokeless tobacco. He reports current alcohol use. He reports that he does not use drugs.  No Known Allergies  Family History  Problem Relation Age of Onset   Diabetes Mother    Heart disease Father    Diabetes Father    Arthritis Other    Hyperlipidemia Other    Hypertension Other    Cancer Other        breast   Mental illness Neg Hx     Prior to Admission medications   Medication Sig Start Date End Date Taking? Authorizing Provider  FIASP 100 UNIT/ML SOLN Inject into the skin continuous. Via pump 10/09/20  Yes [provider]  atorvastatin (LIPITOR) 40 MG tablet Take 40 mg by mouth daily. Patient not taking: Reported on 08/20/2023 08/23/17   [provider]  benazepril (LOTENSIN) 20 MG tablet Take 20 mg by mouth daily. Patient not taking: Reported on 08/20/2023 06/01/21   [provider]  [employer] SENNA 8.6 MG tablet Take 1 tablet by mouth 2 (two) times daily as needed for constipation. 10/31/17   [provider]  HYDROcodone-acetaminophen (NORCO) 10-325 MG tablet Take 1 tablet by mouth every 6 (six) hours as needed for moderate pain or severe pain. 05/03/23   Sunnie Nielsen, DO  HYDROmorphone (DILAUDID) 2 MG tablet Take 2 mg by mouth every 8 (eight) hours. 05/15/23   [provider]  linezolid (ZYVOX) 600 MG tablet Take 1 tablet (600 mg total) by mouth 2 (two) times daily. 06/03/23   Lynn Ito, MD    Physical Exam: Vitals:   08/20/23 1000 08/20/23 1030 08/20/23 1100 08/20/23 1130  BP: 116/73 110/71 129/86 123/79  Pulse: 77 74 78 80  Resp:      Temp:      TempSrc:      SpO2: 98% 98% 100% 100%  Weight:      Height:       Physical Exam Constitutional:      Appearance: He is normal weight.  HENT:     Head: Normocephalic and atraumatic.     Nose: Nose normal.     Mouth/Throat:     Mouth: Mucous membranes are moist.  Eyes:     Pupils: Pupils are equal, round, and reactive to light.  Cardiovascular:     Rate and  Rhythm: Normal rate and regular rhythm.  Pulmonary:     Effort: Pulmonary effort is normal.  Abdominal:     General: Bowel sounds are normal.  Musculoskeletal:     Comments: See picture    Skin:    Comments: See picture   Neurological:     General: No focal deficit present.         Data Reviewed:  There are no new results to review at this time.  DG Knee 1-2 Views Left CLINICAL DATA:  Wound infection  EXAM: LEFT KNEE - 2 VIEW  COMPARISON:  06/01/2023  FINDINGS: Postop below-the-knee amputation. Erosion of the cortex of the lateral stump of the tibia suggests osteomyelitis and represents an interval change. Superior and inferior patellar spurs. No knee joint effusion.  IMPRESSION: Postop changes status post BKA. Lateral cortical disc continuity of the tibia stump suggests the presence of osteomyelitis.  Electronically Signed  By: Layla Maw M.D.   On: 08/20/2023 10:32  Lab Results  Component Value Date   WBC 14.4 (H) 08/20/2023   HGB 10.7 (L) 08/20/2023   HCT 32.3 (L) 08/20/2023   MCV 81.6 08/20/2023   PLT 316 08/20/2023   Last metabolic panel Lab Results  Component Value Date   GLUCOSE 513 (HH) 08/20/2023   NA 125 (L) 08/20/2023   K 2.3 (LL) 08/20/2023   CL 94 (L) 08/20/2023   CO2 19 (L) 08/20/2023   BUN 32 (H) 08/20/2023   CREATININE 2.89 (H) 08/20/2023   GFRNONAA 26 (L) 08/20/2023   CALCIUM 8.3 (L) 08/20/2023   PHOS 3.5 11/29/2018   PROT 7.5 08/20/2023   ALBUMIN 2.6 (L) 08/20/2023   BILITOT 0.6 08/20/2023   ALKPHOS 105 08/20/2023   AST 15 08/20/2023   ALT 12 08/20/2023   ANIONGAP 12 08/20/2023    Assessment and Plan: 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 linezolid during prior 05/2023 Noted active tobacco abuse and poor blood sugar control are major confounders Vascular surgery made aware of case Broached issue of likely need for AKA  MRI of the left knee pending Will place on cefepime, Flagyl and vancomycin for broad-spectrum coverage Follow-up vascular surgery recommendations Infectious disease consultation as clinically indicated   Tobacco abuse 1/2 to 1 pack/day smoker Discussed cessation at length Nicotine patch  Gastro-esophageal reflux disease without esophagitis PPI  Hyperglycemia due to type 2 diabetes mellitus (HCC) Blood sugar in 500s with bicarb of 19 Urinalysis and VBG pending to correlate Will start on hyperglycemia protocol Beta hydroxybutyrate A1c Discussed the importance of blood sugar control at length with patient the bedside Monitor   Acute kidney injury superimposed on chronic kidney disease (HCC) Baseline creatinine 1.5-1.7 with GFR in the 50s Creatinine 2.9 today with GFR in the 20s Mildly dry Also suspect progression of renal disease in setting of poorly controlled diabetes IV fluid hydration Hold nephrotoxic agents Nephrology consult as clinic indicated  Anemia of chronic disease Hemoglobin stable at 10.7 Monitor      Advance Care Planning:   Code Status: Full Code   Consults: Vascular Surgery   Family Communication: No family at the bedside   Severity of Illness: The appropriate patient status for this patient is INPATIENT. Inpatient status is judged to be reasonable and necessary in order to provide the required intensity of service to ensure the patient's safety. The patient's presenting symptoms, physical exam findings, and initial radiographic and laboratory data in the context of their chronic comorbidities is felt to place them at high risk for further clinical deterioration. Furthermore, it is not anticipated that the patient will be medically stable for discharge from the  hospital within 2 midnights of admission.   * I certify that at the point of admission it is my clinical judgment that the patient will require inpatient hospital care spanning beyond 2 midnights from the point of admission due to high intensity of service, high risk for further deterioration and high frequency of surveillance required.*  Author: Floydene Flock, MD 08/20/2023 12:02 PM  For on call review [email or web address].

## 2023-08-20 NOTE — Progress Notes (Signed)
Initial Nutrition Assessment  DOCUMENTATION CODES:   Not applicable  INTERVENTION:   Ensure Max protein supplement po BID, each supplement provides 150kcal and 30g of protein.  MVI po daily   Vitamin C 500mg  po BID   Zinc 220mg  po daily x 30 days  Pt at high refeed risk; recommend monitor potassium, magnesium and phosphorus labs daily until stable  Daily weights  NUTRITION DIAGNOSIS:   Increased nutrient needs related to wound healing as evidenced by estimated needs.  GOAL:   Patient will meet greater than or equal to 90% of their needs  MONITOR:   PO intake, Supplement acceptance, Labs, Weight trends, I & O's, Skin  REASON FOR ASSESSMENT:   Consult Wound healing  ASSESSMENT:   47 y/o male with h/o CKD, DM, gastroparesis, GERD, HLD, OSA, HTN, chronic pain, MDD, PAD and diabetic foot ulcer s/p L BKA (2021) who is admitted with stump infection and AKI.  Pt unavailable at time of RD visit; pt having MRI. Pt with increased estimated needs secondary to wound healing. RD will add supplements and vitamins to help pt meet his estimated needs. Pt is at high refeed risk. No new weight this admission but pt does appear to be down ~13lbs from his UBW from his last weight from July. RD will follow up to obtain history and NFPE. RD will provide diabetes diet education when appropriate.   Medications reviewed and include: lovenox, nicotine, cefepime, LRS @ w/ 5% dextrose @125ml /hr, insulin, metronidazole, vancomycin   Labs reviewed: Na 127(L), K 2.7(L), BUN 32(H), creat 2.75(H), Mg 2.3 wnl Wbc- 14.4(H), Hgb 10.7(L), Hct 32.3(L) Cbgs- 366, 442 x 24 hrs  AIC 13.2(H)- 10/1  NUTRITION - FOCUSED PHYSICAL EXAM: Unable to perform at this time   Diet Order:   Diet Order             Diet heart healthy/carb modified Room service appropriate? Yes; Fluid consistency: Thin  Diet effective now                  EDUCATION NEEDS:   Not appropriate for education at this  time  Skin:  Skin Assessment: Reviewed RN Assessment  Last BM:  9/30  Height:   Ht Readings from Last 1 Encounters:  08/20/23 5\' 10"  (1.778 m)    Weight:   Wt Readings from Last 1 Encounters:  08/20/23 88 kg    Ideal Body Weight:  75.45 kg  BMI:  Body mass index is 27.84 kg/m.  Estimated Nutritional Needs:   Kcal:  2100-2400kcal/day  Protein:  105-120g/day  Fluid:  2.3-2.6L/day  Betsey Holiday MS, RD, LDN Please refer to [facility] for RD and/or RD on-call/weekend/after hours pager

## 2023-08-20 NOTE — Assessment & Plan Note (Addendum)
Recurrent left stump infection with noted recent admission July of this year for similar issue Evaluated by vascular surgery with patient deferring conversion from BKA to AKA Noted 1 out of 2 blood cultures with gram-negative rods requiring extended course of p.o. linezolid during prior 05/2023 Noted active tobacco abuse and poor blood sugar control are major confounders Vascular surgery made aware of case Broached issue of likely need for AKA  MRI of the left knee pending Will place on cefepime, Flagyl and vancomycin for broad-spectrum coverage Follow-up vascular surgery recommendations Infectious disease consultation as clinically indicated

## 2023-08-20 NOTE — Consult Note (Addendum)
PHARMACY CONSULT NOTE - ELECTROLYTES  Pharmacy Consult for Electrolyte Monitoring and Replacement   Recent Labs: Potassium (mmol/L)  Date Value  08/20/2023 2.3 (LL)   Magnesium (mg/dL)  Date Value  16/08/9603 2.3   Calcium (mg/dL)  Date Value  54/07/8118 8.3 (L)   Albumin (g/dL)  Date Value  14/78/2956 2.6 (L)   Phosphorus (mg/dL)  Date Value  21/30/8657 3.5   Sodium (mmol/L)  Date Value  08/20/2023 125 (L)    Height: 5\' 10"  (177.8 cm) Weight: 88 kg (194 lb 0.1 oz) IBW/kg (Calculated) : 73 Estimated Creatinine Clearance: 35.3 mL/min (A) (by C-G formula based on SCr of 2.89 mg/dL (H)).  Assessment  Richard Mendoza is a 47 y.o. male presenting with drainage from left BKA stump. 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 Pharmacy has been consulted to monitor and replace electrolytes.  Diet: Heart healthy / carb modified MIVF: Fluids ordered per hyperglycemic emergency protocol Pertinent medications: Kcl 40 mEq PO x 1 and Kcl 10 mEq IV x 2 ordered already for starting potassium of 2.3  Goal of Therapy: Electrolytes within normal limits  Plan:  Pseudohyponatremia in setting of hyperglycemia. Will resolve with glucose management K 2.3 on presentation with mild metabolic acidosis. Suspect large potassium deficit. Continue to run Kcl 10 mEq q1h with new insulin gtt order will be checking BMP q4h for at least a couple checks. Suspect won't be on insulin gtt for long  Thank you for allowing pharmacy to be a part of this patient's care.  Tressie Ellis 08/20/2023 11:49 AM

## 2023-08-20 NOTE — Inpatient Diabetes Management (Signed)
Inpatient Diabetes Program Recommendations  AACE/ADA: New Consensus Statement on Inpatient Glycemic Control (2015)  Target Ranges:  Prepandial:   less than 140 mg/dL      Peak postprandial:   less than 180 mg/dL (1-2 hours)      Critically ill patients:  140 - 180 mg/dL   Lab Results  Component Value Date   GLUCAP 442 (H) 08/20/2023   HGBA1C 12.2 (H) 04/30/2023    Latest Reference Range & Units Most Recent  Sodium 135 - 145 mmol/L 125 (L) 08/20/23 09:02  Potassium 3.5 - 5.1 mmol/L 2.3 (LL) 08/20/23 09:02  Chloride 98 - 111 mmol/L 94 (L) 08/20/23 09:02  CO2 22 - 32 mmol/L 19 (L) 08/20/23 09:02  Glucose 70 - 99 mg/dL 578 (HH) 46/9/62 95:28  Mean Plasma Glucose mg/dL 413.24 02/17/01 72:53  BUN 6 - 20 mg/dL 32 (H) 66/4/40 34:74  Creatinine 0.61 - 1.24 mg/dL 2.59 (H) 56/3/87 56:43  Calcium 8.9 - 10.3 mg/dL 8.3 (L) 32/9/51 88:41  Anion gap 5 - 15  12 08/20/23 09:02  (LL): Data is critically low (HH): Data is critically high (L): Data is abnormally low (H): Data is abnormally high  Admit with: Wound infection in BKA stump    History: DM2, CKD   Home DM Meds: Omni Pod Insulin Pump with Fiasp Insulin                              Dexcom G6 CGM   Current Orders: IV insulin  Inpatient Diabetes Program Recommendations:   Note A1c was 12.2% June 2024 Diabetes Coordinator met with pt in June and July during Admissions and provided counseling Pt was supposed to switch from the [employer] Insulin Pump to the [employer] Insulin Pump but unable to due to insurance coverage (spoke with wife via phone).   Basal insulin  12A      3.6 units/hour Total daily basal insulin: 86.4 units/24 hours   Carb Coverage 1:4       1 unit for every 4 grams of carbohydrates   Insulin Sensitivity 1:20     1 unit drops blood glucose 20 mg/dl   Target Glucose Goals 12A100-110 mg/dl   Active insulin: 4 hours  K+ is currently 2.3. Dr. Alvester Morin gives order to hold insulin drip until K+ improved.  Spoke  with wife via phone (wife not @ hospital). Patient has not been wearing a glucose sensor due to insurance not covering. Patient had been wearing Dexcom G6 which communicated with his insulin pump. Will plan to provide patient with a Dexcom G7 prior to discharge but will need to be updated @ physician's office to communicate with insulin pump.  Thank you, Billy Fischer. Ozell Ferrera, RN, MSN, CDE  Diabetes Coordinator Inpatient Glycemic Control Team Team Pager [phone] (8am-5pm) 08/20/2023 1:18 PM

## 2023-08-20 NOTE — Consult Note (Signed)
PHARMACY CONSULT NOTE - ELECTROLYTES  Pharmacy Consult for Electrolyte Monitoring and Replacement   Recent Labs: Potassium (mmol/L)  Date Value  08/20/2023 3.3 (L)   Magnesium (mg/dL)  Date Value  95/62/1308 2.3   Calcium (mg/dL)  Date Value  65/78/4696 8.0 (L)   Albumin (g/dL)  Date Value  29/52/8413 2.6 (L)   Phosphorus (mg/dL)  Date Value  24/40/1027 3.5   Sodium (mmol/L)  Date Value  08/20/2023 129 (L)    Height: 5\' 10"  (177.8 cm) Weight: 88 kg (194 lb 0.1 oz) IBW/kg (Calculated) : 73 Estimated Creatinine Clearance: 38.8 mL/min (A) (by C-G formula based on SCr of 2.63 mg/dL (H)).  Assessment  Richard Mendoza is a 47 y.o. male presenting with drainage from left BKA stump. 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 Pharmacy has been consulted to monitor and replace electrolytes.  Diet: Heart healthy / carb modified MIVF: Fluids ordered per hyperglycemic emergency protocol Pertinent medications: N/A  Goal of Therapy: Electrolytes within normal limits  Plan:  Pseudohyponatremia in setting of hyperglycemia. Will resolve with glucose management K low on presentation with mild metabolic acidosis. Suspect large potassium deficit. Give KCL 10 mEq IV x4 runs Check K+ q4h  Thank you for involving pharmacy in this patient's care.   Rockwell Alexandria, PharmD Clinical Pharmacist 08/20/2023 8:02 PM

## 2023-08-20 NOTE — Consult Note (Signed)
Pharmacy Antibiotic Note  Richard Mendoza is a 47 y.o. 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 Pharmacy has been consulted for cefepime and vancomycin dosing. Patient is also ordered metronidazole.  Labs on admission notable for AKI with Scr 2.89. Patient appears to have component of CKD at baseline with Scr 1 . 6 - 1.8 in 06/07 of 2024. 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  Plan:  Cefepime 2 g IV q12h  Vancomycin 2 g IV LD followed by dose per levels given un-stable renal function --Will check Scr tomorrow AM and decide on ordering maintenance regimen versus need to order level  Height: 5\' 10"  (177.8 cm) Weight: 88 kg (194 lb 0.1 oz) IBW/kg (Calculated) : 73  Temp (24hrs), Avg:97.9 F (36.6 C), Min:97.9 F (36.6 C), Max:97.9 F (36.6 C)  Recent Labs  Lab 08/20/23 0902  WBC 14.4*  CREATININE 2.89*  LATICACIDVEN 1.2    Estimated Creatinine Clearance: 35.3 mL/min (A) (by C-G formula based on SCr of 2.89 mg/dL (H)).    No Known Allergies  Antimicrobials this admission: Cefepime 10/1 >>  Vancomycin 10/1 >>  Metronidazole 10/1 >>   Dose adjustments this admission: N/A  Microbiology results: 10/2 BCx: pending  Thank you for allowing pharmacy to be a part of this patient's care.  Tressie Ellis 08/20/2023 11:35 AM

## 2023-08-20 NOTE — ED Provider Notes (Signed)
[facility] Provider Note    Event Date/Time   First MD Initiated Contact with Patient 08/20/23 0920     (approximate)   History   Wound Infection   HPI  Richard Mendoza is a 47 y.o. 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  Reports that about a week ago there was a small amount of drainage but today it is increased.   I reviewed the note where patient was seen on 7/15 and admitted to the hospital.  Patient also has a history of of type 1 diabetes, chronic pain, CKD, tobacco abuse.  In June he had a abscess and had I&D done by Dr. Debroah Loop.  He then had another MRI that was concerning for osteomyelitis.  Patient was on IV antibiotics and on linozolid   Patient reports that he was has a little bit of a wound on the bottom of his stump secondary to his prosthetic however he reports over the past couple days has had increasing discomfort and noticing small amounts of drainage but today he has not had significant amount of clearish drainage coming out of it.  He denies any fevers but states he does not typically get fevers when he has had prior infections in the leg.  He reports significant pain in the lower stump as well.  He is not any longer on a pain contract does not take any opioids at home.   Physical Exam   Triage Vital Signs: ED Triage Vitals  Encounter Vitals Group     BP 08/20/23 0851 131/76     Systolic BP Percentile --      Diastolic BP Percentile --      Pulse Rate 08/20/23 0851 83     Resp 08/20/23 0851 18     Temp 08/20/23 0851 97.9 F (36.6 C)     Temp Source 08/20/23 0851 Oral     SpO2 08/20/23 0851 100 %     Weight 08/20/23 0852 194 lb 0.1 oz (88 kg)     Height 08/20/23 0852 5\' 10"  (1.778 m)     Head Circumference --      Peak Flow --      Pain Score 08/20/23 0852 10     Pain Loc --      Pain Education --      Exclude from Growth Chart --     Most recent  vital signs: Vitals:   08/20/23 0851  BP: 131/76  Pulse: 83  Resp: 18  Temp: 97.9 F (36.6 C)  SpO2: 100%     General: Awake, no distress.  CV:  Good peripheral perfusion.  Resp:  Normal effort.  Abd:  No distention.  Other:  Patient's left stump noted to have ulceration with clearish drainage coming out of it there is some surrounding erythema noted to the ulceration.  No discoloration   ED Results / Procedures / Treatments   Labs (all labs ordered are listed, but only abnormal results are displayed) Labs Reviewed  LACTIC ACID, PLASMA  LACTIC ACID, PLASMA  COMPREHENSIVE METABOLIC PANEL  CBC WITH DIFFERENTIAL/PLATELET      RADIOLOGY I have reviewed the xray personally and interpreted no evidence of any free air but some concern for bony destruction   PROCEDURES:  Critical Care performed: No  .Critical Care  Performed by: Concha Se, MD Authorized by: Concha Se, MD   Critical care provider  statement:    Critical care time (minutes):  30   Critical care was necessary to treat or prevent imminent or life-threatening deterioration of the following conditions:  Sepsis   Critical care was time spent personally by me on the following activities:  Development of treatment plan with patient or surrogate, discussions with consultants, evaluation of patient's response to treatment, examination of patient, ordering and review of laboratory studies, ordering and review of radiographic studies, ordering and performing treatments and interventions, pulse oximetry, re-evaluation of patient's condition and review of old charts    MEDICATIONS ORDERED IN ED: Medications  metroNIDAZOLE (FLAGYL) IVPB 500 mg (has no administration in time range)  potassium chloride 10 mEq in 100 mL IVPB (0 mEq Intravenous Stopped 08/20/23 1059)  ceFEPIme (MAXIPIME) 2 g in sodium chloride 0.9 % 100 mL IVPB (has no administration in time range)  lactated ringers bolus 1,000 mL (has no  administration in time range)  vancomycin (VANCOCIN) IVPB 1000 mg/200 mL premix (0 mg Intravenous Stopped 08/20/23 1059)  HYDROmorphone (DILAUDID) injection 1 mg (1 mg Intravenous Given 08/20/23 0952)  potassium chloride SA (KLOR-CON M) CR tablet 40 mEq (40 mEq Oral Given 08/20/23 1008)     IMPRESSION / MDM / ASSESSMENT AND PLAN / ED COURSE  I reviewed the triage vital signs and the nursing notes.   Patient's presentation is most consistent with exacerbation of chronic illness.   Patient comes in with concern for an acute on chronic exacerbation of his left BKA.  This is concerning for possible sepsis.  Initial heart rate was over 90 and white count is elevated therefore broad-spectrum antibiotics were started patient was not hypotensive and only start off with 1 L of fluid given resolution of heart rates. Full fluid was not given to prevent fluid overload.  X-ray was ordered this concerning for possible osteo consider the possibility of abscess versus osteo but no obvious fluctuation at this time but deferred to the inpatient team to order MRI in conjunction with vascular's consultation.  Patient's labs do show slight AKI, elevated glucose and low potassium.  Will start on IV potassium hold off on any insulin until potassium is better corrected.  I did discuss the case with the vascular team NP pace and I did discuss the case with the hospitalist for admission.     FINAL CLINICAL IMPRESSION(S) / ED DIAGNOSES   Final diagnoses:  Cellulitis of left lower extremity  Osteomyelitis, unspecified site, unspecified type (HCC)  Sepsis, due to unspecified organism, unspecified whether acute organ dysfunction present [facility])     Rx / DC Orders   ED Discharge Orders     None        Note:  This document was prepared using Dragon voice recognition software and may include unintentional dictation errors.   Concha Se, MD 08/20/23 [phone]

## 2023-08-20 NOTE — Assessment & Plan Note (Signed)
Baseline creatinine 1.5-1.7 with GFR in the 50s Creatinine 2.9 today with GFR in the 20s Mildly dry Also suspect progression of renal disease in setting of poorly controlled diabetes IV fluid hydration Hold nephrotoxic agents Nephrology consult as clinic indicated

## 2023-08-20 NOTE — Assessment & Plan Note (Signed)
1/2 to 1 pack/day smoker Discussed cessation at length Nicotine patch

## 2023-08-20 NOTE — Consult Note (Signed)
PHARMACY -  BRIEF ANTIBIOTIC NOTE   Pharmacy has received consult(s) for cefepime and vancomycin from an ED provider. Patient is also ordered metronidazole. The patient's profile has been reviewed for ht/wt/allergies/indication/available labs.    One time order(s) placed for  --Vancomycin 1 g IV --Cefepime 2 g IV  Further antibiotics/pharmacy consults should be ordered by admitting physician if indicated.                       Thank you, Tressie Ellis 08/20/2023  9:55 AM

## 2023-08-20 NOTE — Consult Note (Signed)
PHARMACY CONSULT NOTE - ELECTROLYTES  Pharmacy Consult for Electrolyte Monitoring and Replacement   Recent Labs: Potassium (mmol/L)  Date Value  08/20/2023 2.7 (LL)   Magnesium (mg/dL)  Date Value  95/62/1308 2.3   Calcium (mg/dL)  Date Value  65/78/4696 7.9 (L)   Albumin (g/dL)  Date Value  29/52/8413 2.6 (L)   Phosphorus (mg/dL)  Date Value  24/40/1027 3.5   Sodium (mmol/L)  Date Value  08/20/2023 127 (L)    Height: 5\' 10"  (177.8 cm) Weight: 88 kg (194 lb 0.1 oz) IBW/kg (Calculated) : 73 Estimated Creatinine Clearance: 37.1 mL/min (A) (by C-G formula based on SCr of 2.75 mg/dL (H)).  Assessment  Cortez Flippen is a 47 y.o. male presenting with drainage from left BKA stump. 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 Pharmacy has been consulted to monitor and replace electrolytes.  Diet: Heart healthy / carb modified MIVF: Fluids ordered per hyperglycemic emergency protocol Pertinent medications: Kcl 40 mEq PO x 1 and Kcl 10 mEq IV x 2 ordered already for starting potassium of 2.3  Goal of Therapy: Electrolytes within normal limits  Plan:  Pseudohyponatremia in setting of hyperglycemia. Will resolve with glucose management K 2.3 >> 2.7 on presentation with mild metabolic acidosis. Suspect large potassium deficit. Continue to run Kcl 10 mEq q1h with new insulin gtt order will be checking BMP q4h for at least a couple checks. Suspect won't be on insulin gtt for long Re-check 2.7, Kcl 40 mEq PO x 1 ordered by provider Continue to run Kcl 10 mEq q1h IV additionally Check K+ q4h  Thank you for allowing pharmacy to be a part of this patient's care.  Tressie Ellis 08/20/2023 3:47 PM

## 2023-08-20 NOTE — Progress Notes (Signed)
CODE SEPSIS - PHARMACY COMMUNICATION  **Broad Spectrum Antibiotics should be administered within 1 hour of Sepsis diagnosis**  Time Code Sepsis Called/Page Received: 0930  Antibiotics Ordered: Cefepime + vancomycin + metronidazole  Time of 1st antibiotic administration: 0955  Additional action taken by pharmacy: N/A  Tressie Ellis 08/20/2023  9:56 AM

## 2023-08-21 ENCOUNTER — Inpatient Hospital Stay: Payer: Medicaid Other

## 2023-08-21 DIAGNOSIS — Z794 Long term (current) use of insulin: Secondary | ICD-10-CM

## 2023-08-21 DIAGNOSIS — E1165 Type 2 diabetes mellitus with hyperglycemia: Secondary | ICD-10-CM

## 2023-08-21 DIAGNOSIS — M86162 Other acute osteomyelitis, left tibia and fibula: Secondary | ICD-10-CM | POA: Diagnosis not present

## 2023-08-21 DIAGNOSIS — N189 Chronic kidney disease, unspecified: Secondary | ICD-10-CM

## 2023-08-21 DIAGNOSIS — T8740 Infection of amputation stump, unspecified extremity: Secondary | ICD-10-CM | POA: Diagnosis not present

## 2023-08-21 DIAGNOSIS — N179 Acute kidney failure, unspecified: Secondary | ICD-10-CM | POA: Diagnosis not present

## 2023-08-21 DIAGNOSIS — E1169 Type 2 diabetes mellitus with other specified complication: Secondary | ICD-10-CM

## 2023-08-21 DIAGNOSIS — T8744 Infection of amputation stump, left lower extremity: Principal | ICD-10-CM

## 2023-08-21 LAB — COMPREHENSIVE METABOLIC PANEL
ALT: 18 U/L (ref 0–44)
AST: 34 U/L (ref 15–41)
Albumin: 2.2 g/dL — ABNORMAL LOW (ref 3.5–5.0)
Alkaline Phosphatase: 84 U/L (ref 38–126)
Anion gap: 6 (ref 5–15)
BUN: 33 mg/dL — ABNORMAL HIGH (ref 6–20)
CO2: 19 mmol/L — ABNORMAL LOW (ref 22–32)
Calcium: 8.1 mg/dL — ABNORMAL LOW (ref 8.9–10.3)
Chloride: 107 mmol/L (ref 98–111)
Creatinine, Ser: 2.26 mg/dL — ABNORMAL HIGH (ref 0.61–1.24)
GFR, Estimated: 35 mL/min — ABNORMAL LOW (ref >60)
Glucose, Bld: 147 mg/dL — ABNORMAL HIGH (ref 70–99)
Potassium: 3.4 mmol/L — ABNORMAL LOW (ref 3.5–5.1)
Sodium: 132 mmol/L — ABNORMAL LOW (ref 135–145)
Total Bilirubin: 0.4 mg/dL (ref 0.3–1.2)
Total Protein: 6.6 g/dL (ref 6.5–8.1)

## 2023-08-21 LAB — CBC
HCT: 26.9 % — ABNORMAL LOW (ref 39.0–52.0)
Hemoglobin: 9.2 g/dL — ABNORMAL LOW (ref 13.0–17.0)
MCH: 27.5 pg (ref 26.0–34.0)
MCHC: 34.2 g/dL (ref 30.0–36.0)
MCV: 80.5 fL (ref 80.0–100.0)
Platelets: 248 10*3/uL (ref 150–400)
RBC: 3.34 MIL/uL — ABNORMAL LOW (ref 4.22–5.81)
RDW: 14.6 % (ref 11.5–15.5)
WBC: 8.5 10*3/uL (ref 4.0–10.5)
nRBC: 0 % (ref 0.0–0.2)

## 2023-08-21 LAB — BASIC METABOLIC PANEL
Anion gap: 5 (ref 5–15)
BUN: 30 mg/dL — ABNORMAL HIGH (ref 6–20)
CO2: 17 mmol/L — ABNORMAL LOW (ref 22–32)
Calcium: 7.9 mg/dL — ABNORMAL LOW (ref 8.9–10.3)
Chloride: 107 mmol/L (ref 98–111)
Creatinine, Ser: 2.08 mg/dL — ABNORMAL HIGH (ref 0.61–1.24)
GFR, Estimated: 39 mL/min — ABNORMAL LOW (ref >60)
Glucose, Bld: 144 mg/dL — ABNORMAL HIGH (ref 70–99)
Potassium: 3.2 mmol/L — ABNORMAL LOW (ref 3.5–5.1)
Sodium: 129 mmol/L — ABNORMAL LOW (ref 135–145)

## 2023-08-21 LAB — IRON AND TIBC
Iron: 11 ug/dL — ABNORMAL LOW (ref 45–182)
Saturation Ratios: 7 % — ABNORMAL LOW (ref 17.9–39.5)
TIBC: 155 ug/dL — ABNORMAL LOW (ref 250–450)
UIBC: 144 ug/dL

## 2023-08-21 LAB — GLUCOSE, CAPILLARY
Glucose-Capillary: 123 mg/dL — ABNORMAL HIGH (ref 70–99)
Glucose-Capillary: 131 mg/dL — ABNORMAL HIGH (ref 70–99)
Glucose-Capillary: 132 mg/dL — ABNORMAL HIGH (ref 70–99)
Glucose-Capillary: 132 mg/dL — ABNORMAL HIGH (ref 70–99)
Glucose-Capillary: 132 mg/dL — ABNORMAL HIGH (ref 70–99)
Glucose-Capillary: 148 mg/dL — ABNORMAL HIGH (ref 70–99)
Glucose-Capillary: 152 mg/dL — ABNORMAL HIGH (ref 70–99)
Glucose-Capillary: 154 mg/dL — ABNORMAL HIGH (ref 70–99)
Glucose-Capillary: 183 mg/dL — ABNORMAL HIGH (ref 70–99)
Glucose-Capillary: 203 mg/dL — ABNORMAL HIGH (ref 70–99)

## 2023-08-21 LAB — VITAMIN B12: Vitamin B-12: 606 pg/mL (ref 180–914)

## 2023-08-21 LAB — PHOSPHORUS: Phosphorus: 1.9 mg/dL — ABNORMAL LOW (ref 2.5–4.6)

## 2023-08-21 LAB — FERRITIN: Ferritin: 412 ng/mL — ABNORMAL HIGH (ref 24–336)

## 2023-08-21 LAB — MAGNESIUM: Magnesium: 2.2 mg/dL (ref 1.7–2.4)

## 2023-08-21 MED ORDER — HYDROMORPHONE HCL 1 MG/ML IJ SOLN
1.0000 mg | INTRAMUSCULAR | Status: DC | PRN
  Administered 2023-08-21 – 2023-08-24 (×14): 1 mg via INTRAVENOUS
  Filled 2023-08-21 (×14): qty 1

## 2023-08-21 MED ORDER — POTASSIUM CHLORIDE 10 MEQ/100ML IV SOLN
10.0000 meq | INTRAVENOUS | Status: AC
  Administered 2023-08-21 (×3): 10 meq via INTRAVENOUS
  Filled 2023-08-21 (×2): qty 100

## 2023-08-21 MED ORDER — VANCOMYCIN VARIABLE DOSE PER UNSTABLE RENAL FUNCTION (PHARMACIST DOSING): Status: DC

## 2023-08-21 MED ORDER — SODIUM CHLORIDE 0.9 % IV SOLN: 2.0000 g | Freq: Two times a day (BID) | INTRAVENOUS | Status: DC

## 2023-08-21 MED ORDER — INSULIN GLARGINE-YFGN 100 UNIT/ML ~~LOC~~ SOLN
24.0000 [IU] | Freq: Every day | SUBCUTANEOUS | Status: DC
  Filled 2023-08-21: qty 0.24

## 2023-08-21 MED ORDER — INSULIN PUMP
Freq: Three times a day (TID) | SUBCUTANEOUS | Status: DC
  Administered 2023-08-24: 4.3 via SUBCUTANEOUS
  Administered 2023-08-24: 3.2 via SUBCUTANEOUS
  Administered 2023-08-24: 2.2 via SUBCUTANEOUS
  Administered 2023-08-25: 1 via SUBCUTANEOUS
  Administered 2023-08-27: 17.3 via SUBCUTANEOUS
  Administered 2023-08-28 (×2): 9 via SUBCUTANEOUS
  Filled 2023-08-21: qty 1

## 2023-08-21 MED ORDER — ORAL CARE MOUTH RINSE: 15.0000 mL | OROMUCOSAL | Status: DC | PRN

## 2023-08-21 MED ORDER — POTASSIUM CHLORIDE CRYS ER 20 MEQ PO TBCR
40.0000 meq | EXTENDED_RELEASE_TABLET | Freq: Once | ORAL | Status: AC
  Administered 2023-08-21: 40 meq via ORAL
  Filled 2023-08-21: qty 2

## 2023-08-21 MED ORDER — OXYCODONE-ACETAMINOPHEN 5-325 MG PO TABS: 1.0000 | ORAL_TABLET | ORAL | Status: DC | PRN

## 2023-08-21 MED ORDER — POTASSIUM CHLORIDE 10 MEQ/100ML IV SOLN
10.0000 meq | INTRAVENOUS | Status: AC
  Administered 2023-08-21 (×4): 10 meq via INTRAVENOUS
  Filled 2023-08-21 (×4): qty 100

## 2023-08-21 MED ORDER — STERILE WATER FOR INJECTION IV SOLN
INTRAVENOUS | Status: AC
  Filled 2023-08-21: qty 1000
  Filled 2023-08-21 (×2): qty 150

## 2023-08-21 MED ORDER — POTASSIUM PHOSPHATES 15 MMOLE/5ML IV SOLN
30.0000 mmol | Freq: Once | INTRAVENOUS | Status: AC
  Administered 2023-08-21: 30 mmol via INTRAVENOUS
  Filled 2023-08-21: qty 10

## 2023-08-21 MED ORDER — INSULIN ASPART 100 UNIT/ML IJ SOLN: 3.0000 [IU] | Freq: Three times a day (TID) | INTRAMUSCULAR | Status: DC

## 2023-08-21 MED ORDER — INSULIN ASPART 100 UNIT/ML IJ SOLN: 0.0000 [IU] | Freq: Three times a day (TID) | INTRAMUSCULAR | Status: DC

## 2023-08-21 MED ORDER — HYDROMORPHONE HCL 1 MG/ML IJ SOLN
0.5000 mg | INTRAMUSCULAR | Status: DC | PRN
  Administered 2023-08-21: 0.5 mg via INTRAVENOUS
  Filled 2023-08-21: qty 1

## 2023-08-21 MED ORDER — POTASSIUM CHLORIDE 2 MEQ/ML IV SOLN
INTRAVENOUS | Status: DC
  Filled 2023-08-21: qty 1000

## 2023-08-21 MED ORDER — VANCOMYCIN HCL 1750 MG/350ML IV SOLN
1750.0000 mg | Freq: Once | INTRAVENOUS | Status: AC
  Administered 2023-08-21: 1750 mg via INTRAVENOUS
  Filled 2023-08-21: qty 350

## 2023-08-21 NOTE — Consult Note (Addendum)
NAMEMarvyn Mendoza  DOB: Jun 12, 1976  MRN: [id number]  Date/Time: 08/21/2023 6:32 PM  REQUESTING PROVIDER: Dr. Chipper Herb Subjective:  REASON FOR CONSULT: MRSA bacteremia ? Richard Mendoza is a 47 y.o. 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 As per patient the drainage was clear and profuse but different than previous MRSA infection where it was purulent and the skin was erythematous  Vitals in the ED  08/20/23  BP 111/73  Temp 98.2 F (36.8 C)  Pulse Rate 76  Resp 18  SpO2 97 %    Latest Reference Range & Units 08/20/23  WBC 4.0 - 10.5 K/uL 14.4 (H)  Hemoglobin 13.0 - 17.0 g/dL 47.8 (L)  HCT 29.5 - 62.1 % 32.3 (L)  Platelets 150 - 400 K/uL 316  Creatinine 0.61 - 1.24 mg/dL 3.08 (H)  Blood culture sent Started broad spectrum antibiotic MRI left knee showed synovial fluid knee joint- abscess at the ulcer site and  tibia osteo   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;  Surgeon: Linus Galas, DPM;  Location: [facility];  Service: Podiatry;  Laterality: Left;   AMPUTATION Left 11/16/2020   Procedure: AMPUTATION BELOW KNEE;  Surgeon: Annice Needy, MD;  Location: [facility];  Service: Vascular;  Laterality: Left;   ANTERIOR CERVICAL DECOMP/DISCECTOMY FUSION N/A 09/09/2017   Procedure: ANTERIOR CERVICAL  DECOMPRESSION/DISCECTOMY FUSION CERVICAL 6- CERVICAL 7;  Surgeon: Coletta Memos, MD;  Location: [facility];  Service: Neurosurgery;  Laterality: N/A;  ANTERIOR CERVICAL DECOMPRESSION/DISCECTOMY FUSION CERVICAL 6- CERVICAL 7   APPENDECTOMY     I & D EXTREMITY Right 10/03/2017   Procedure: IRRIGATION AND DEBRIDEMENT RIGHT WRIST;  Surgeon: Betha Loa, MD;  Location: [facility];  Service: Orthopedics;  Laterality: Right;   I & D EXTREMITY Right 11/26/2018   Procedure: IRRIGATION AND DEBRIDEMENT FASCIA ON RIGHT FOOT;  Surgeon: Gwyneth Revels, DPM;  Location: [facility];  Service: Podiatry;  Laterality: Right;   INCISION AND DRAINAGE Right 03/06/2019   Procedure: INCISION AND DRAINAGE RIGHT FOOT, WITH 4th RAY AMPUTATION;  Surgeon: Gwyneth Revels, DPM;  Location: [facility];  Service: Podiatry;  Laterality: Right;   INCISION AND DRAINAGE Left 11/07/2020   Procedure: INCISION AND DRAINAGE;  Surgeon: Linus Galas, DPM;  Location: [facility];  Service: Podiatry;  Laterality: Left;   INCISION AND DRAINAGE ABSCESS Left 05/02/2023   Procedure: INCISION AND DRAINAGE ABSCESS LEFT LOWER EXTREMITY;  Surgeon: Annice Needy, MD;  Location: [facility];  Service: Vascular;  Laterality: Left;   IRRIGATION AND DEBRIDEMENT FOOT Left 11/11/2020   Procedure: IRRIGATION AND DEBRIDEMENT FOOT;  Surgeon: Linus Galas, DPM;  Location: [facility];  Service: Podiatry;  Laterality: Left;   METATARSAL HEAD EXCISION Right 05/15/2019   Procedure: OSTECTOMY;MET HEAD 5;  Surgeon: Gwyneth Revels, DPM;  Location: [facility];  Service: Podiatry;  Laterality: Right;   osteomylitis     ROTATOR CUFF REPAIR Left     Social History   Socioeconomic History   Marital status: Married    Spouse name: Not on file   Number of children: 4   Years of education: Not on file   Highest education level: Not on file  Occupational History   Not on file  Tobacco Use   Smoking status: Some Days    Current packs/day: 0.00    Average packs/day: 1 pack/day for 17.0 years (17.0  ttl pk-yrs)    Types: Cigarettes    Start date: 01/14/2002    Last attempt to quit: 01/14/2019    Years since quitting: 4.6   Smokeless tobacco: Never  Vaping Use   Vaping status: Never Used  Substance and Sexual Activity   Alcohol use: Yes    Comment: rare   Drug use: No   Sexual activity: Yes  Other Topics Concern   Not on file  Social History Narrative   Not on file   Social Determinants of Health   Financial Resource Strain: Low Risk  (07/24/2022)   Received from [facility], [facility]   Overall Financial Resource Strain (CARDIA)    Difficulty of Paying Living Expenses: Not hard at all  Food Insecurity: No Food Insecurity (06/01/2023)   Hunger Vital Sign    Worried About Running Out of Food in the Last Year: Never true    Ran Out of Food in the Last Year: Never true  Transportation Needs: No Transportation Needs (06/01/2023)   PRAPARE - Administrator, Civil Service (Medical): No    Lack of Transportation (Non-Medical): No  Physical Activity: Not on file  Stress: No Stress Concern Present (07/24/2022)   Received from [facility], [facility] - Occupational Stress Questionnaire    Feeling of Stress : Not at all  Social Connections: Unknown (03/21/2022)   Received from [facility], [facility]   Social Network    Social Network: Not on file  Intimate Partner Violence: Not At Risk (06/01/2023)   Humiliation, Afraid, Rape, and Kick questionnaire    Fear of Current or Ex-Partner: No    Emotionally Abused: No    Physically Abused: No    Sexually Abused: No    Family History  Problem Relation Age of Onset   Diabetes Mother    Heart disease Father    Diabetes Father    Arthritis Other    Hyperlipidemia Other    Hypertension Other    Cancer Other        breast   Mental illness Neg Hx    Allergies  Allergen Reactions   Oxycodone Itching   I? Current Facility-Administered Medications  Medication Dose  Route Frequency Provider Last Rate Last Admin   ascorbic acid (VITAMIN C) tablet 500 mg  500 mg Oral BID Floydene Flock, MD   500 mg at 08/21/23 4098   atorvastatin (LIPITOR) tablet 40 mg  40 mg Oral Daily Floydene Flock, MD   40 mg at 08/21/23 1191   Chlorhexidine Gluconate Cloth 2 % PADS 6 each  6 each Topical Daily Floydene Flock, MD   6 each at 08/21/23 0939   cyclobenzaprine (FLEXERIL) tablet 5 mg  5 mg Oral BID PRN Floydene Flock, MD   5 mg at 08/20/23 1804   dextrose 50 % solution 0-50 mL  0-50 mL Intravenous PRN  Floydene Flock, MD       enoxaparin (LOVENOX) injection 40 mg  40 mg Subcutaneous Q24H Floydene Flock, MD   40 mg at 08/21/23 1421   HYDROmorphone (DILAUDID) injection 1 mg  1 mg Intravenous Q4H PRN Marrion Coy, MD   1 mg at 08/21/23 1807   insulin pump   Subcutaneous TID WC, HS, 0200 Marrion Coy, MD   Given at 08/21/23 1723   lidocaine (LIDODERM) 5 % 1 patch  1 patch Transdermal Q24H Andris Baumann, MD   1 patch at 08/20/23 2225   multivitamin with minerals tablet 1 tablet  1 tablet Oral Daily Floydene Flock, MD   1 tablet at 08/21/23 1610   mupirocin ointment (BACTROBAN) 2 % 1 Application  1 Application Nasal BID Floydene Flock, MD   1 Application at 08/21/23 [phone]   nicotine (NICODERM CQ - dosed in mg/24 hours) patch 14 mg  14 mg Transdermal Daily Floydene Flock, MD   14 mg at 08/21/23 0937   ondansetron (ZOFRAN) tablet 4 mg  4 mg Oral Q6H PRN Floydene Flock, MD       Or   ondansetron [facility]) injection 4 mg  4 mg Intravenous Q6H PRN Floydene Flock, MD       Oral care mouth rinse  15 mL Mouth Rinse PRN Floydene Flock, MD       protein supplement (ENSURE MAX) liquid  11 oz Oral BID Floydene Flock, MD       sodium bicarbonate 150 mEq in sterile water 1,150 mL infusion   Intravenous Continuous Marrion Coy, MD 100 mL/hr at 08/21/23 1257 Infusion Verify at 08/21/23 1257   vancomycin variable dose per unstable renal function (pharmacist dosing)   Does not  apply See admin instructions Littie Deeds, RPH       zinc sulfate capsule 220 mg  220 mg Oral Daily Floydene Flock, MD   220 mg at 08/21/23 5409     Abtx:  Anti-infectives (From admission, onward)    Start     Dose/Rate Route Frequency Ordered Stop   08/21/23 1625  vancomycin variable dose per unstable renal function (pharmacist dosing)         Does not apply See admin instructions 08/21/23 1626     08/21/23 0700  vancomycin (VANCOREADY) IVPB 1750 mg/350 mL        1,750 mg 175 mL/hr over 120 Minutes Intravenous  Once 08/21/23 0550 08/21/23 1806   08/21/23 0600  ceFEPIme (MAXIPIME) 2 g in sodium chloride 0.9 % 100 mL IVPB  Status:  Discontinued        2 g 200 mL/hr over 30 Minutes Intravenous Every 12 hours 08/21/23 0313 08/21/23 0418   08/20/23 2100  metroNIDAZOLE (FLAGYL) IVPB 500 mg  Status:  Discontinued        500 mg 100 mL/hr over 60 Minutes Intravenous Every 12 hours 08/20/23 1123 08/20/23 1405   08/20/23 1600  metroNIDAZOLE (FLAGYL) IVPB 500 mg  Status:  Discontinued        500 mg 100 mL/hr over 60 Minutes Intravenous Every 12 hours 08/20/23 1405 08/21/23 0418   08/20/23 1500  ceFEPIme (MAXIPIME) 2 g in sodium chloride 0.9 % 100 mL IVPB  Status:  Discontinued        2 g 200 mL/hr over 30 Minutes Intravenous Every 12 hours 08/20/23 1405 08/21/23 0313   08/20/23 1200  ceFEPIme (MAXIPIME) 2 g in sodium chloride 0.9 % 100  mL IVPB  Status:  Discontinued        2 g 200 mL/hr over 30 Minutes Intravenous Every 12 hours 08/20/23 1138 08/20/23 1405   08/20/23 1145  vancomycin (VANCOCIN) IVPB 1000 mg/200 mL premix  Status:  Discontinued        1,000 mg 200 mL/hr over 60 Minutes Intravenous  Once 08/20/23 1137 08/21/23 0550   08/20/23 1000  ceFEPIme (MAXIPIME) 2 g in sodium chloride 0.9 % 100 mL IVPB  Status:  Discontinued        2 g 200 mL/hr over 30 Minutes Intravenous  Once 08/20/23 0955 08/20/23 1138   08/20/23 0945  vancomycin (VANCOCIN) IVPB 1000 mg/200 mL premix        1,000  mg 200 mL/hr over 60 Minutes Intravenous  Once 08/20/23 0930 08/20/23 1059   08/20/23 0945  metroNIDAZOLE (FLAGYL) IVPB 500 mg  Status:  Discontinued        500 mg 100 mL/hr over 60 Minutes Intravenous  Once 08/20/23 0930 08/20/23 1404       REVIEW OF SYSTEMS:  Const: negative fever, negative chills, negative weight loss Eyes: negative diplopia or visual changes, negative eye pain ENT: negative coryza, negative sore throat Resp: +cough, hemoptysis, dyspnea Cards: negative for chest pain, palpitations, lower extremity edema GU: negative for frequency, dysuria and hematuria GI: Negative for abdominal pain, diarrhea, bleeding, constipation Skin: negative for rash and pruritus Heme: negative for easy bruising and gum/nose bleeding MS: as above Neurolo:negative for headaches, dizziness, vertigo, memory problems  Psych: depression  Endocrine:  diabetes Allergy/Immunology- oxycodone Objective:  VITALS:  BP (!) 158/96 (BP Location: Left Arm)   Pulse 87   Temp 98.7 F (37.1 C) (Oral)   Resp 20   Ht 5\' 10"  (1.778 m)   Wt 89 kg   SpO2 100%   BMI 28.15 kg/m   PHYSICAL EXAM:  General: Alert, cooperative, no distress, appears stated age.  Head: Normocephalic, without obvious abnormality, atraumatic. Eyes: Conjunctivae clear, anicteric sclerae. Pupils are equal ENT Nares normal. No drainage or sinus tenderness. Lips, mucosa, and tongue normal. No Thrush Neck: Supple, symmetrical, no adenopathy, thyroid: non tender no carotid bruit and no JVD. Back: No CVA tenderness. Lungs: Clear to auscultation bilaterally. No Wheezing or Rhonchi. No rales. Heart: Regular rate and rhythm, no murmur, rub or gallop. Abdomen: Soft, non-tender,not distended. Bowel sounds normal. No masses Extremities:left bka- stump ulcer Skin: No rashes or lesions. Or bruising Lymph: Cervical, supraclavicular normal. Neurologic: Grossly non-focal Pertinent Labs Lab Results CBC    Component Value Date/Time    WBC 8.5 08/21/2023 0303   RBC 3.34 (L) 08/21/2023 0303   HGB 9.2 (L) 08/21/2023 0303   HCT 26.9 (L) 08/21/2023 0303   PLT 248 08/21/2023 0303   MCV 80.5 08/21/2023 0303   MCH 27.5 08/21/2023 0303   MCHC 34.2 08/21/2023 0303   RDW 14.6 08/21/2023 0303   LYMPHSABS 0.4 (L) 08/20/2023 0902   MONOABS 0.2 08/20/2023 0902   EOSABS 0.0 08/20/2023 0902   BASOSABS 0.0 08/20/2023 0902       Latest Ref Rng & Units 08/21/2023    7:23 AM 08/21/2023    3:03 AM 08/20/2023   10:59 PM  CMP  Glucose 70 - 99 mg/dL 086  578  469   BUN 6 - 20 mg/dL 30  33  32   Creatinine 0.61 - 1.24 mg/dL 6.29  5.28  4.13   Sodium 135 - 145 mmol/L 129  132  129  Potassium 3.5 - 5.1 mmol/L 3.2  3.4  2.8   Chloride 98 - 111 mmol/L 107  107  101   CO2 22 - 32 mmol/L 17  19  18    Calcium 8.9 - 10.3 mg/dL 7.9  8.1  7.9   Total Protein 6.5 - 8.1 g/dL  6.6    Total Bilirubin 0.3 - 1.2 mg/dL  0.4    Alkaline Phos 38 - 126 U/L  84    AST 15 - 41 U/L  34    ALT 0 - 44 U/L  18        Microbiology: Recent Results (from the past 240 hour(s))  Blood culture (routine x 2)     Status: None (Preliminary result)   Collection Time: 08/20/23  9:45 AM   Specimen: BLOOD LEFT ARM  Result Value Ref Range Status   Specimen Description BLOOD LEFT ARM  Final   Special Requests   Final    BOTTLES DRAWN AEROBIC AND ANAEROBIC Blood Culture adequate volume   Culture  Setup Time   Final    GRAM POSITIVE COCCI IN BOTH AEROBIC AND ANAEROBIC BOTTLES CRITICAL VALUE NOTED.  VALUE IS CONSISTENT WITH PREVIOUSLY REPORTED AND CALLED VALUE. Performed at [facility], [address] [place], [place] [address]    Culture [facility] POSITIVE COCCI  Final   Report Status PENDING  Incomplete  Blood culture (routine x 2)     Status: None (Preliminary result)   Collection Time: 08/20/23  9:45 AM   Specimen: BLOOD LEFT ARM  Result Value Ref Range Status   Specimen Description   Final    BLOOD LEFT ARM Performed at [facility],  [address], [place], [place] [address]    Special Requests   Final    BOTTLES DRAWN AEROBIC AND ANAEROBIC Blood Culture adequate volume Performed at [facility], [address] [place], [place] [address]    Culture  Setup Time   Final    Organism ID to follow GRAM POSITIVE COCCI IN BOTH AEROBIC AND ANAEROBIC BOTTLES CRITICAL RESULT CALLED TO, READ BACK BY AND VERIFIED WITH: NATHAN BELUE PHARMD @0208  08/21/23 ASW    Culture GRAM POSITIVE COCCI  Final   Report Status PENDING  Incomplete  Blood Culture ID Panel (Reflexed)     Status: Abnormal   Collection Time: 08/20/23  9:45 AM  Result Value Ref Range Status   Enterococcus faecalis NOT DETECTED NOT DETECTED Final   Enterococcus Faecium NOT DETECTED NOT DETECTED Final   Listeria monocytogenes NOT DETECTED NOT DETECTED Final   Staphylococcus species DETECTED (A) NOT DETECTED Final    Comment: CRITICAL RESULT CALLED TO, READ BACK BY AND VERIFIED WITH: NATHAN BELUE PHARMD @0208  08/21/23 ASW    Staphylococcus aureus (BCID) DETECTED (A) NOT DETECTED Final    Comment: Methicillin (oxacillin)-resistant Staphylococcus aureus (MRSA). MRSA is predictably resistant to beta-lactam antibiotics (except ceftaroline). Preferred therapy is vancomycin unless clinically contraindicated. Patient requires contact precautions if  hospitalized. CRITICAL RESULT CALLED TO, READ BACK BY AND VERIFIED WITH: NATHAN BELUE PHARMD @0208  08/21/23 ASW    Staphylococcus epidermidis NOT DETECTED NOT DETECTED Final   Staphylococcus lugdunensis NOT DETECTED NOT DETECTED Final   Streptococcus species DETECTED (A) NOT DETECTED Final    Comment: CRITICAL RESULT CALLED TO, READ BACK BY AND VERIFIED WITH: NATHAN BELUE PHARMD @0208  08/21/23 ASW    Streptococcus agalactiae DETECTED (A) NOT DETECTED Final   Streptococcus pneumoniae NOT DETECTED NOT DETECTED Final   Streptococcus pyogenes NOT  DETECTED NOT DETECTED Final   A.calcoaceticus-baumannii NOT DETECTED  NOT DETECTED Final   Bacteroides fragilis NOT DETECTED NOT DETECTED Final   Enterobacterales NOT DETECTED NOT DETECTED Final   Enterobacter cloacae complex NOT DETECTED NOT DETECTED Final   Escherichia coli NOT DETECTED NOT DETECTED Final   Klebsiella aerogenes NOT DETECTED NOT DETECTED Final   Klebsiella oxytoca NOT DETECTED NOT DETECTED Final   Klebsiella pneumoniae NOT DETECTED NOT DETECTED Final   Proteus species NOT DETECTED NOT DETECTED Final   Salmonella species NOT DETECTED NOT DETECTED Final   Serratia marcescens NOT DETECTED NOT DETECTED Final   Haemophilus influenzae NOT DETECTED NOT DETECTED Final   Neisseria meningitidis NOT DETECTED NOT DETECTED Final   Pseudomonas aeruginosa NOT DETECTED NOT DETECTED Final   Stenotrophomonas maltophilia NOT DETECTED NOT DETECTED Final   Candida albicans NOT DETECTED NOT DETECTED Final   Candida auris NOT DETECTED NOT DETECTED Final   Candida glabrata NOT DETECTED NOT DETECTED Final   Candida krusei NOT DETECTED NOT DETECTED Final   Candida parapsilosis NOT DETECTED NOT DETECTED Final   Candida tropicalis NOT DETECTED NOT DETECTED Final   Cryptococcus neoformans/gattii NOT DETECTED NOT DETECTED Final   Meth resistant mecA/C and MREJ DETECTED (A) NOT DETECTED Final    Comment: CRITICAL RESULT CALLED TO, READ BACK BY AND VERIFIED WITH: NATHAN BELUE PHARMD @0208  08/21/23 ASW Performed at [facility], [address] [place], [place] [address]   MRSA Next Gen by PCR, Nasal     Status: Abnormal   Collection Time: 08/20/23 12:26 PM   Specimen: Nasal Mucosa; Nasal Swab  Result Value Ref Range Status   MRSA by PCR Next Gen DETECTED (A) NOT DETECTED Final    Comment: RESULT CALLED TO, READ BACK BY AND VERIFIED WITH: KATIE LUMOS 08/20/23 1407 KLW (NOTE) The GeneXpert MRSA Assay (FDA approved for NASAL specimens only), is one component of a comprehensive MRSA colonization surveillance program. It is not intended to diagnose MRSA  infection nor to guide or monitor treatment for MRSA infections. Test performance is not FDA approved in patients less than 34 years old. Performed at [facility], [address] [place], [place] [address]     IMAGING RESULTS: MRI left knee reviewed MRI left knee showed synovial fluid knee joint- abscess at the ulcer site and  tibia osteo I have personally reviewed the films ? Impression/Recommendation ?MRSA bacteremia Source lef BKA site infection Pt is currently on vanco- change to dapto because of ckd  Left BKA stump infection with osteomyelitis - seen by vascular- will discuss with them regarding debridment- currently no plans He will need 6 weeks of IV follow ed by indefinite suppressive therapy because of repeated MRSA infection Took culture  Anemia  DM  HTN  CKD  Discussed the management with patient and his wife ? ? ___________________________________________________ Discussed with patient, requesting provider Note:  This document was prepared using Dragon voice recognition software and may include unintentional dictation errors.

## 2023-08-21 NOTE — Progress Notes (Signed)
Progress Note    08/21/2023 1:39 PM * No surgery found *  Subjective:  Richard Mendoza is a 47 yo male  with medical history significant of uncontrolled type 2 diabetes, tobacco abuse, hypertension, hyperlipidemia, history of BKA presenting with diabetic stump infection. Patient reports worsening drainage at the base of his stump over the past 1 to 2 weeks.   On exam this afternoon the patient is resting comfortably in bed.  Patient endorses since starting antibiotics that the drainage to his leg is decreased some.  Patient underwent MRI of his left lower extremity yesterday.  Results are listed below.  EXAM: 08/20/23 MRI OF THE LEFT KNEE WITHOUT AND WITH CONTRAST   TECHNIQUE: Multiplanar, multisequence MR imaging of the left knee was performed both before and after administration of intravenous contrast.   CONTRAST:  7.59mL GADAVIST GADOBUTROL 1 MMOL/ML IV SOLN   COMPARISON:  Radiographs 08/20/2023 and 06/01/2023. MRI 06/01/2023 and 04/30/2023.   FINDINGS: Bones/Joint/Cartilage   Status post below the knee amputation. Compared with the previous MRI, there is significantly increasing T2 hyperintensity and heterogeneous enhancement throughout the remaining tibia, highly suspicious for osteomyelitis. Probable mild cortical destruction along the lateral aspect of the distal stump. The remaining fibula appears intact. No abnormality of the distal femur or patella identified. A moderate-sized knee joint effusion has enlarged from the prior study and is associated with mild diffuse synovial enhancement. There are no associated erosive changes.   Ligaments   The cruciate and collateral ligaments are intact. The menisci are intact.   Muscles and Tendons   Interval increased edema and heterogeneous enhancement throughout the muscles of the lower leg. The visualized quadriceps and patellar tendons are intact.   Soft tissues   New peripherally enhancing fluid collection  extending inferiorly from the tibial stump, measuring up to 1.5 x 1.1 cm transverse and 2.6 cm in length. This extends nearly to the skin surface and is consistent with a small soft tissue abscess. There is skin irregularity along the distal aspect of the stump. No other focal fluid collections are identified. Scattered postsurgical susceptibility artifact within the distal soft tissues.   IMPRESSION: 1. New small soft tissue abscess extending inferiorly from the tibial stump. 2. Significant interval increase in T2 hyperintensity and enhancement throughout the remaining tibia, highly suspicious for osteomyelitis. 3. Enlarging knee joint effusion with mild synovial enhancement, nonspecific. Cannot exclude early septic arthritis. No evidence of osteomyelitis within the distal femur, patella or proximal fibula.  Vitals:   08/21/23 1200 08/21/23 1306  BP: (!) 166/93 (!) 158/96  Pulse: 85 87  Resp: 16 20  Temp:  98.7 F (37.1 C)  SpO2: 98% 100%   Physical Exam: Cardiac:  RRR, Normal S1, S2.  No murmurs appreciated. Lungs: Normal respiratory effort, clear on auscultation throughout.  No rales rhonchi or wheezing noted Incisions: None Extremities: Left lower BKA with noted ulcer with decreasing drainage today about the size of a quarter.  Stump cellulitis appears to be better today. Abdomen: Positive bowel sounds throughout, soft, nontender and nondistended. Neurologic: Alert and oriented x 4, answers questions and follows commands appropriately.  CBC    Component Value Date/Time   WBC 8.5 08/21/2023 0303   RBC 3.34 (L) 08/21/2023 0303   HGB 9.2 (L) 08/21/2023 0303   HCT 26.9 (L) 08/21/2023 0303   PLT 248 08/21/2023 0303   MCV 80.5 08/21/2023 0303   MCH 27.5 08/21/2023 0303   MCHC 34.2 08/21/2023 0303   RDW 14.6 08/21/2023 0303  LYMPHSABS 0.4 (L) 08/20/2023 0902   MONOABS 0.2 08/20/2023 0902   EOSABS 0.0 08/20/2023 0902   BASOSABS 0.0 08/20/2023 0902    BMET     Component Value Date/Time   NA 129 (L) 08/21/2023 0723   K 3.2 (L) 08/21/2023 0723   CL 107 08/21/2023 0723   CO2 17 (L) 08/21/2023 0723   GLUCOSE 144 (H) 08/21/2023 0723   BUN 30 (H) 08/21/2023 0723   CREATININE 2.08 (H) 08/21/2023 0723   CALCIUM 7.9 (L) 08/21/2023 0723   GFRNONAA 39 (L) 08/21/2023 0723   GFRAA >60 05/05/2020 1930    INR    Component Value Date/Time   INR 1.0 06/01/2023 1535     Intake/Output Summary (Last 24 hours) at 08/21/2023 1339 Last data filed at 08/21/2023 1257 Gross per 24 hour  Intake 5564.03 ml  Output 1400 ml  Net 4164.03 ml     Assessment/Plan:  47 y.o. male is HD #2 left below-knee amputation infection.* No surgery found *   PLAN: Vascular surgery had a detailed conversation with the patient today regarding the findings of the patient's MRI.  Patient was informed that the small tissue abscess extending inferiorly from the tibial stump is most likely what is draining.  Currently on antibiotics the drainage seems to be decreasing which is a good sign.  He was also alerted that there was a hyperintensity and enhancement with the remaining tibia which could be suspicious for osteomyelitis.  We discussed in detail the difficulties of treating osteomyelitis with antibiotics only.  We also discussed his enlarging knee joint effusion with mild synovial enhancement which could include septic arthritis.  He was also informed that there was no evidence of osteomyelitis within the distal femur, patella or proximal fibula.  I discussed in detail today with Henley Edgecombe what makes treating this difficult is his noncompliance in taking care of his diabetes mellitus 2.  Patient entered the hospital with blood sugars above 500 and therefore he would be a very difficult candidate to do any surgical procedure on with respect to him healing properly postoperatively.  He endorses his understanding and agrees.  He also endorses that he is not looking for a surgical fix  or any surgical procedure to help correct the infection to his left lower stump/extremity ie above the knee amputation.  He would like to treat conservatively with antibiotics.  Therefore vascular surgery recommends at this time that the patient continue with current IV antibiotics in the hopes that this will progressively clear his possible osteomyelitis and abscess.  Patient was informed he may need to have go home on long-term antibiotics and this would require him having a PICC line placed.  If in the future his infection does not clear he will require an above-the-knee amputation.  He verbalizes understanding to this.  As for his septic knee vascular surgery recommends consult to orthopedics for possible needle drainage as this may be the least invasive procedure that can be done to clear this and help with his overall infectious status.  Again he verbalizes understanding and would like to proceed.  Patient will need strict and very tight control of his DM2 if he is to have any chance of clearing the infection with antibiotics.   Vascular surgery will not plan any surgical intervention at this time.    DVT prophylaxis:  Lovenox 40 mg Q 24 Hrs.    Marcie [facility] 08/21/2023 1:39 PM

## 2023-08-21 NOTE — Progress Notes (Signed)
PHARMACY - PHYSICIAN COMMUNICATION CRITICAL VALUE ALERT - BLOOD CULTURE IDENTIFICATION (BCID)  Results for orders placed or performed during the hospital encounter of 08/20/23  Blood culture (routine x 2)     Status: None (Preliminary result)   Collection Time: 08/20/23  9:45 AM   Specimen: BLOOD LEFT ARM  Result Value Ref Range Status   Specimen Description BLOOD LEFT ARM  Final   Special Requests   Final    BOTTLES DRAWN AEROBIC AND ANAEROBIC Blood Culture adequate volume   Culture  Setup Time   Final    Organism ID to follow GRAM POSITIVE COCCI IN BOTH AEROBIC AND ANAEROBIC BOTTLES CRITICAL RESULT CALLED TO, READ BACK BY AND VERIFIED WITH: Mayte Diers PHARMD @0208  08/21/23 ASW Performed at [facility], [address] [place], [place] [address]    Culture GRAM POSITIVE COCCI  Final   Report Status PENDING  Incomplete  Blood Culture ID Panel (Reflexed)     Status: Abnormal   Collection Time: 08/20/23  9:45 AM  Result Value Ref Range Status   Enterococcus faecalis NOT DETECTED NOT DETECTED Final   Enterococcus Faecium NOT DETECTED NOT DETECTED Final   Listeria monocytogenes NOT DETECTED NOT DETECTED Final   Staphylococcus species DETECTED (A) NOT DETECTED Final    Comment: CRITICAL RESULT CALLED TO, READ BACK BY AND VERIFIED WITH: Cari Vandeberg PHARMD @0208  08/21/23 ASW    Staphylococcus aureus (BCID) DETECTED (A) NOT DETECTED Final    Comment: Methicillin (oxacillin)-resistant Staphylococcus aureus (MRSA). MRSA is predictably resistant to beta-lactam antibiotics (except ceftaroline). Preferred therapy is vancomycin unless clinically contraindicated. Patient requires contact precautions if  hospitalized. CRITICAL RESULT CALLED TO, READ BACK BY AND VERIFIED WITH: Tavari Loadholt PHARMD @0208  08/21/23 ASW    Staphylococcus epidermidis NOT DETECTED NOT DETECTED Final   Staphylococcus lugdunensis NOT DETECTED NOT DETECTED Final   Streptococcus species DETECTED (A) NOT  DETECTED Final    Comment: CRITICAL RESULT CALLED TO, READ BACK BY AND VERIFIED WITH: Bee Marchiano PHARMD @0208  08/21/23 ASW    Streptococcus agalactiae DETECTED (A) NOT DETECTED Final   Streptococcus pneumoniae NOT DETECTED NOT DETECTED Final   Streptococcus pyogenes NOT DETECTED NOT DETECTED Final   A.calcoaceticus-baumannii NOT DETECTED NOT DETECTED Final   Bacteroides fragilis NOT DETECTED NOT DETECTED Final   Enterobacterales NOT DETECTED NOT DETECTED Final   Enterobacter cloacae complex NOT DETECTED NOT DETECTED Final   Escherichia coli NOT DETECTED NOT DETECTED Final   Klebsiella aerogenes NOT DETECTED NOT DETECTED Final   Klebsiella oxytoca NOT DETECTED NOT DETECTED Final   Klebsiella pneumoniae NOT DETECTED NOT DETECTED Final   Proteus species NOT DETECTED NOT DETECTED Final   Salmonella species NOT DETECTED NOT DETECTED Final   Serratia marcescens NOT DETECTED NOT DETECTED Final   Haemophilus influenzae NOT DETECTED NOT DETECTED Final   Neisseria meningitidis NOT DETECTED NOT DETECTED Final   Pseudomonas aeruginosa NOT DETECTED NOT DETECTED Final   Stenotrophomonas maltophilia NOT DETECTED NOT DETECTED Final   Candida albicans NOT DETECTED NOT DETECTED Final   Candida auris NOT DETECTED NOT DETECTED Final   Candida glabrata NOT DETECTED NOT DETECTED Final   Candida krusei NOT DETECTED NOT DETECTED Final   Candida parapsilosis NOT DETECTED NOT DETECTED Final   Candida tropicalis NOT DETECTED NOT DETECTED Final   Cryptococcus neoformans/gattii NOT DETECTED NOT DETECTED Final   Meth resistant mecA/C and MREJ DETECTED (A) NOT DETECTED Final    Comment: CRITICAL RESULT CALLED TO, READ BACK BY AND VERIFIED WITH: Yoali Conry PHARMD @  5409 08/21/23 ASW Performed at [facility], [address] [place], [place] [address]   MRSA Next Gen by PCR, Nasal     Status: Abnormal   Collection Time: 08/20/23 12:26 PM   Specimen: Nasal Mucosa; Nasal Swab  Result Value Ref Range  Status   MRSA by PCR Next Gen DETECTED (A) NOT DETECTED Final    Comment: RESULT CALLED TO, READ BACK BY AND VERIFIED WITH: KATIE LUMOS 08/20/23 1407 KLW (NOTE) The GeneXpert MRSA Assay (FDA approved for NASAL specimens only), is one component of a comprehensive MRSA colonization surveillance program. It is not intended to diagnose MRSA infection nor to guide or monitor treatment for MRSA infections. Test performance is not FDA approved in patients less than 60 years old. Performed at [facility], [address] [place], [place] [address]     BCID Results: 2 (same set) of 4 bottles with Staph Aureus (mecA/C & MREJ detected) and Strep agalactiae.MRSA by PCR also detected.  Pt currently on Cefepime, Flagyl, and Vancomycin.  Name of provider contacted: Doylene Canard, MD   Changes to prescribed antibiotics required: Stop Cefepime & Flagyl, continue Vancomycin.  Otelia Sergeant, PharmD, [facility] 08/21/2023 3:57 AM

## 2023-08-21 NOTE — Hospital Course (Addendum)
Richard Mendoza is a 47 y.o. male with medical history significant of uncontrolled type 2 diabetes, tobacco abuse, hypertension, hyperlipidemia, history of BKA presenting with diabetic stump infection.  Patient reports worsening drainage at the base of his stump over the past 1 to 2 weeks.  Patient is seen by vascular surgery, started on broad-spectrum antibiotics with cefepime, Flagyl and vancomycin.  MRI of the left lower extremity showed soft tissue abscess, tibial osteomyelitis, knee joint effusion. Blood culture 1 set positive for Staph aureus, epidermidis and strep agalactiae.  Patient is seen by ID, treated with vancomycin then changed to daptomycin. Patient also found to have significant bursitis in the right shoulder MRI, joint aspiration consistent with septic joint.  Joint wash 10/8.

## 2023-08-21 NOTE — Inpatient Diabetes Management (Addendum)
Inpatient Diabetes Program Recommendations  AACE/ADA: New Consensus Statement on Inpatient Glycemic Control (2015)  Target Ranges:  Prepandial:   less than 140 mg/dL      Peak postprandial:   less than 180 mg/dL (1-2 hours)      Critically ill patients:  140 - 180 mg/dL   Lab Results  Component Value Date   GLUCAP 123 (H) 08/21/2023   HGBA1C 13.2 (H) 08/20/2023    Latest Reference Range & Units Most Recent  Sodium 135 - 145 mmol/L 129 (L) 08/21/23 07:23  Potassium 3.5 - 5.1 mmol/L 3.2 (L) 08/21/23 07:23  Chloride 98 - 111 mmol/L 107 08/21/23 07:23  CO2 22 - 32 mmol/L 17 (L) 08/21/23 07:23  Glucose 70 - 99 mg/dL 161 (H) 07/26/03 54:09  Mean Plasma Glucose mg/dL 811.91 47/8/29 56:21  BUN 6 - 20 mg/dL 30 (H) 30/8/65 78:46  Creatinine 0.61 - 1.24 mg/dL 9.62 (H) 95/2/84 13:24  Calcium 8.9 - 10.3 mg/dL 7.9 (L) 40/1/02 72:53  Anion gap 5 - 15  5 08/21/23 07:23  (L): Data is abnormally low (H): Data is abnormally high Noted CO2 17.  Inpatient Diabetes Program Recommendations:   Patient orders to D/C IV insulin drip. Discussed with Dr. Chipper Herb the option to restart insulin pump with new insertion site and patient is alert and oriented and able to manage his own insulin pump. Patient to restart insulin pump and has all supplies and restarting with a new insertion site. Will continue IV insulin until one hour after insulin pump has been restarted. Spoke with RN Yehuda Budd regarding plan and Shelly Coss has not be administered yet.  Met with patient @ bedside. Reviewed A1c 13.2 (average blood glucose 332 over the past 2-3 months). Patient shared that he has been trying to work with his doctors to help blood sugars to decrease, but was not able to get sensors so has not been checking his CBGs as he should. Gave patient two Dexcom G7 CGMs for home use per request. Patient has been using Dexcom G6 until he was not able to get them. Discussed risks of elevated blood glucose and patient  acknowledged understanding.  Thank you, Billy Fischer. Jacquie Lukes, RN, MSN, CDE  Diabetes Coordinator Inpatient Glycemic Control Team Team Pager [phone] (8am-5pm) 08/21/2023 9:18 AM

## 2023-08-21 NOTE — Progress Notes (Addendum)
1308 Contacted Dr Para March in regards to patients blood sugar of 335 and patients Potassium of 3.3. Dr Para March instructed nurse to restart insulin drip and to continue potassium replacement.  0001  Dr Para March notified of patients potassium of 2.8 and insulin drips current rate of 14. 4 more runs of K and oral potassium replacement ordered.

## 2023-08-21 NOTE — Consult Note (Signed)
Pharmacy Antibiotic Note  Richard Mendoza is a 47 y.o. 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 Pharmacy has been consulted for cefepime and vancomycin dosing. Patient is also ordered metronidazole.  Labs on admission notable for AKI with Scr 2.89. Patient appears to have component of CKD at baseline with Scr 1 . 6 - 1.8 in 06/07 of 2024. 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  Plan:  Cefepime 2 g IV q12h  Vancomycin 2 g IV LD followed by dose per levels given un-stable renal function --Will check Scr tomorrow AM and decide on ordering maintenance regimen versus need to order level  10/02  SCr still trending down.  RN contacted pharmacy.  2nd Vancomycin 1 gm bag scheduled for 10/01 never given (~ 20 hrs since 1st dose.)  Ordered additional dose of Vancomycin 1750 mg x 1.  Pharmacy will continue to follow and recheck SCr tomorrow AM.  Height: 5\' 10"  (177.8 cm) Weight: 89 kg (196 lb 3.4 oz) IBW/kg (Calculated) : 73  Temp (24hrs), Avg:98.1 F (36.7 C), Min:97.9 F (36.6 C), Max:98.2 F (36.8 C)  Recent Labs  Lab 08/20/23 0902 08/20/23 1505 08/20/23 1918 08/20/23 2259 08/21/23 0303  WBC 14.4*  --   --   --  8.5  CREATININE 2.89* 2.75* 2.63* 2.40* 2.26*  LATICACIDVEN 1.2 1.4  --   --   --     Estimated Creatinine Clearance: 45.4 mL/min (A) (by C-G formula based on SCr of 2.26 mg/dL (H)).    No Known Allergies  Antimicrobials this admission: Cefepime 10/1 >>  Vancomycin 10/1 >>  Metronidazole 10/1 >>   Dose adjustments this admission: N/A  Microbiology results: 10/2 BCx: pending  Thank you for allowing pharmacy to be a part of this patient's care.  Otelia Sergeant, PharmD, [facility] 08/21/2023 6:06 AM

## 2023-08-21 NOTE — TOC Initial Note (Signed)
Transition of Care [facility]) - Initial/Assessment Note    Patient Details  Name: Richard Mendoza MRN: [id number] Date of Birth: [birth year]  Transition of Care [facility]) CM/SW Contact:    Margarito Liner, LCSW Phone Number: 08/21/2023, 9:05 AM  Clinical Narrative:  Readmission prevention screen complete. CSW met with patient. No supports at bedside. CSW introduced role and explained that discharge planning would be discussed. PCP is Dr. Marcello Fennel. Patient drives himself to appointments. [facility] [address] [facility] [place]. Patient stated he somehow got [facility]. He has taken this off at his doctors offices, etc but whenever he gets it taken off at his pharmacy, they add it back. Patient reports this is affecting the cost of his medications. CSW notified the ICU pharmacist and [facility] to see if [facility] can be [facility]. Patient lives home with his wife, 3 sons, and 2 of his sons' girlfriends. No home health prior to admission. He uses a RW at home. No further concerns. CSW encouraged patient to contact CSW as needed. CSW will continue to follow patient for support and facilitate return home once stable. His wife or oldest son will transport him home at discharge.                Expected Discharge Plan: Home/Self Care Barriers to Discharge: Continued Medical Work up   Patient Goals and CMS Choice            Expected Discharge Plan and Services     Post Acute Care Choice: NA Living arrangements for the past 2 months: Single Family Home                                      Prior Living Arrangements/Services Living arrangements for the past 2 months: Single Family Home Lives with:: Adult Children, Spouse, Other (Comment) (Two of his sons' girlfriends) Patient language and need for interpreter reviewed:: Yes Do you feel safe going back to the place where you live?: Yes      Need for Family Participation in Patient Care: Yes (Comment) Care giver support system in  place?: Yes (comment) Current home services: DME Criminal Activity/Legal Involvement Pertinent to Current Situation/Hospitalization: No - Comment as needed  Activities of Daily Living      Permission Sought/Granted                  Emotional Assessment Appearance:: Appears stated age Attitude/Demeanor/Rapport: Engaged, Gracious Affect (typically observed): Accepting, Appropriate, Calm, Pleasant Orientation: : Oriented to Self, Oriented to Place, Oriented to  Time, Oriented to Situation Alcohol / Substance Use: Not Applicable Psych Involvement: No (comment)  Admission diagnosis:  Amputation stump infection (HCC) [T87.40] Cellulitis of left lower extremity [L03.116] Osteomyelitis, unspecified site, unspecified type (HCC) [M86.9] Sepsis, due to unspecified organism, unspecified whether acute organ dysfunction present [facility]) [A41.9] Patient Active Problem List   Diagnosis Date Noted   Amputation stump infection (HCC) 08/20/2023   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) 06/01/2023   Chronic kidney disease, stage 3a (HCC) 06/01/2023   Tobacco abuse 06/01/2023   Cellulitis and abscess of left leg 04/30/2023   Cellulitis of left lower extremity 04/29/2023   Tobacco use 04/29/2023   Carpal tunnel syndrome 07/12/2022   Hyperglycemia due to type 2 diabetes mellitus (HCC) 07/12/2022   Polyneuropathy 07/12/2022   Ulnar neuropathy 07/12/2022   Acute epigastric pain 07/12/2022  Gastro-esophageal reflux disease without esophagitis 07/12/2022   Nausea and vomiting 07/12/2022   Abscess of right upper extremity 02/26/2022   Cellulitis of left lower extremity without foot    Sepsis (HCC) 09/08/2021   Hypokalemia 09/06/2021   Hyponatremia 09/06/2021   PAD (peripheral artery disease) (HCC) 05/26/2021   Acute kidney injury superimposed on chronic kidney disease (HCC) 04/03/2021   Syncope 04/03/2021   Neck pain    Abnormal weight loss 12/09/2020   Change in bowel  habit 12/09/2020   Constipation 12/09/2020   Epigastric pain 12/09/2020   Drug-induced constipation 12/09/2020   Periumbilical pain 12/09/2020   Fatty liver 12/09/2020   S/P BKA (below knee amputation), left (HCC) 11/20/2020   Necrotizing fasciitis of ankle and foot (HCC) 11/07/2020   History of 2019 novel coronavirus disease (COVID-19) 09/01/2020   Major depressive disorder, recurrent, in remission (HCC) 09/01/2020   Pain management contract signed 09/07/2019   Evaluation by psychiatric service required 08/18/2019   History of depression 08/18/2019   Primary osteoarthritis of both shoulders 07/16/2019   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) 07/16/2019   Chronic pain of both shoulders 07/16/2019   Cervical radicular pain 07/16/2019   S/P cervical spinal fusion 07/16/2019   Cervical spondylosis 07/16/2019   Cervical facet joint syndrome 07/16/2019   Chronic pain syndrome 07/16/2019   Anemia of chronic disease 05/25/2019   Osteomyelitis (HCC) 05/25/2019   Diabetic foot infection (HCC) 03/05/2019   Type 2 diabetes mellitus without complication (HCC) 01/01/2019   Abscess 11/25/2018   Diabetic peripheral neuropathy associated with type 2 diabetes mellitus (HCC) 06/16/2018   Hyperlipidemia associated with type 2 diabetes mellitus (HCC) 06/16/2018   Intractable nausea and vomiting 10/21/2017   HNP (herniated nucleus pulposus), cervical 09/09/2017   Displacement of cervical intervertebral disc without myelopathy 08/27/2017   Erectile dysfunction 07/31/2017   Gastroesophageal reflux disease 07/31/2017   Rotator cuff syndrome of left shoulder 07/31/2017   Cellulitis of right leg 07/30/2016   Tobacco abuse disorder 07/30/2016   Routine general medical examination at a health care facility 01/20/2014   Diabetic neuropathy, type I diabetes mellitus (HCC) 01/20/2014   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   HLD (hyperlipidemia) 03/17/2009   DM type 2 with diabetic peripheral neuropathy  (HCC) 03/02/2009   Uncontrolled diabetes mellitus with hyperglycemia (HCC) 12/08/2008   OBSTRUCTIVE SLEEP APNEA 12/08/2008   Essential hypertension 12/08/2008   Allergic rhinitis 12/15/2007   Other malaise and fatigue 12/15/2007   PCP:  Barbette Reichmann, MD Pharmacy:   [id number] [place], [place] - [address] [place] [address] Phone: [phone] Fax: [phone]  [id number] [place], [place] - [address] AT [address] [place] [address] Phone: [phone] Fax: [phone]  [id number] [place], [place] - [address] AT [facility] OF [address] [place] [address] Phone: [phone] Fax: [phone]  [facility] [address] [place] [address] Phone: [phone] Fax: [phone]     Social Determinants of Health (SDOH) Social History: SDOH Screenings   Food Insecurity: No Food Insecurity (06/01/2023)  Housing: High Risk (06/01/2023)  Transportation Needs: No Transportation Needs (06/01/2023)  Utilities: Not At Risk (06/01/2023)  Depression (PHQ2-9): Low Risk  (09/21/2019)  Financial Resource Strain: Low Risk  (07/24/2022)   Received from [facility]  Social Connections: Unknown (03/21/2022)   Received from [facility],  [facility]  Stress: No Stress Concern Present (07/24/2022)   Received from [facility], [facility]  Tobacco Use: High Risk (06/01/2023)   SDOH Interventions:     Readmission Risk Interventions    08/21/2023    9:03 AM  Readmission Risk Prevention Plan  Transportation Screening Complete  PCP or Specialist Appt within 3-5 Days Complete  Social Work Consult for Recovery Care Planning/Counseling Complete  Palliative Care Screening Not Applicable  Medication Review [employer]) Complete

## 2023-08-21 NOTE — Progress Notes (Signed)
Verbal conversation with Dawayne Cirri [facility] regarding patients Morning Dose of Vancomycin Admin instruction reads Give in addition to 1G dose already Administered to complete total loading dose of 2G. Current ordered dose of 1750mg  would make a total loading dose of 2.75G instead of the instructed 2G. Pharmacist confirmed he wanted the 1750mg  administrated and to disregard the admin instruction on the order.

## 2023-08-21 NOTE — Progress Notes (Signed)
Progress Note   Patient: Richard Mendoza ZOX:[id number] DOB: [birth year] DOA: 08/20/2023     1 DOS: the patient was seen and examined on 08/21/2023   Brief hospital course: Richard Mendoza is a 47 y.o. male with medical history significant of uncontrolled type 2 diabetes, tobacco abuse, hypertension, hyperlipidemia, history of BKA presenting with diabetic stump infection.  Patient reports worsening drainage at the base of his stump over the past 1 to 2 weeks.  Patient is seen by vascular surgery, started on broad-spectrum antibiotics with cefepime, Flagyl and vancomycin.  MRI of the left lower extremity showed soft tissue abscess, tibial osteomyelitis, knee joint effusion. Patient also had significant hyperglycemia, treated with insulin drip.  Glucose is better.   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 Tibial osteomyelitis. Large left knee joint effusion, possible septic joint. Recurrent left stump infection with noted recent admission July of this year for similar issue Evaluated by vascular surgery with patient deferring conversion from BKA to AKA Blood culture 1 set positive for Staph aureus, epidermidis and strep agalactiae.  repeated blood culture was sent out today. Patient currently receiving vancomycin after each dialysis. Based on MRI results, vascular surgery is considering AKA.  Will continue current antibiotics.  Acute kidney injury on chronic kidney disease stage IIIa. Hyponatremia. Metabolic  acidosis. Hypokalemia. Hypophosphatemia. Patient renal function still not at baseline, still has significant metabolic acidosis.  Will start sodium bicarb drip.  Continue given potassium. Patient will also receiving potassium and phosphate for phosphate of 1.9.  Hyperglycemia due to uncontrolled type 2 diabetes mellitus (HCC) Patient is placed on an drip, glucose much better today.  Restart insulin pump.  Tobacco abuse Advised to quit, continue nicotine patch.  Gastro-esophageal reflux disease without esophagitis PPI  Anemia of chronic disease Monitor CBC, check iron B12 level.  Left shoulder pain. Obtain x-ray.    Subjective:  Complaining significant right shoulder pain, denies any nausea vomiting abdominal pain. Complaining of left leg pain.  Physical Exam: Vitals:   08/21/23 0800 08/21/23 0930 08/21/23 1000 08/21/23 1030  BP:   132/81 137/81  Pulse:  79 71 75  Resp:  19 (!) 0 (!) 0  Temp: 98.4 F (36.9 C)     TempSrc: Oral     SpO2:  100% 98% 95%  Weight:      Height:       General exam: Appears calm and comfortable  Respiratory system: Clear to auscultation. Respiratory effort normal. Cardiovascular system: S1 & S2 heard, RRR. No JVD, murmurs, rubs, gallops or clicks. No pedal edema. Gastrointestinal system: Abdomen is nondistended, soft and nontender. No organomegaly or masses felt. Normal bowel sounds heard. Central nervous system: Alert and oriented. No focal neurological deficits. Extremities: Left BKA with ulceration in the stump. Skin: No rashes, lesions or ulcers Psychiatry: Judgement and insight appear normal. Mood & affect appropriate.    Data Reviewed:  Reviewed MRI results, lab results.  Family Communication: None  Disposition: Status is: Inpatient Remains inpatient appropriate because: Severity of disease, IV treatment, pending inpatient procedure.  Time spent: 55 minutes  Author: Marrion Coy, MD 08/21/2023 12:06 PM  For on call  review [email or web address].

## 2023-08-21 NOTE — Consult Note (Signed)
PHARMACY CONSULT NOTE - ELECTROLYTES  Pharmacy Consult for Electrolyte Monitoring and Replacement   Recent Labs: Potassium (mmol/L)  Date Value  08/20/2023 2.8 (L)   Magnesium (mg/dL)  Date Value  63/87/5643 2.3   Calcium (mg/dL)  Date Value  32/95/1884 7.9 (L)   Albumin (g/dL)  Date Value  16/60/6301 2.6 (L)   Phosphorus (mg/dL)  Date Value  60/08/9322 3.5   Sodium (mmol/L)  Date Value  08/20/2023 129 (L)    Height: 5\' 10"  (177.8 cm) Weight: 88 kg (194 lb 0.1 oz) IBW/kg (Calculated) : 73 Estimated Creatinine Clearance: 42.5 mL/min (A) (by C-G formula based on SCr of 2.4 mg/dL (H)).  Assessment  Richard Mendoza is a 47 y.o. male presenting with drainage from left BKA stump. 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 Pharmacy has been consulted to monitor and replace electrolytes.  Diet: Heart healthy / carb modified MIVF: Fluids ordered per hyperglycemic emergency protocol Pertinent medications: N/A  Goal of Therapy: Electrolytes within normal limits  Plan:  Pseudohyponatremia in setting of hyperglycemia. Will resolve with glucose management K low on presentation with mild metabolic acidosis. Suspect large potassium deficit. Give KCL 10 mEq IV x4 runs Recheck BMP q4hr  Thank you for involving pharmacy in this patient's care.   Otelia Sergeant, PharmD, [facility] 08/21/2023 12:07 AM

## 2023-08-22 ENCOUNTER — Inpatient Hospital Stay: Payer: Medicaid Other

## 2023-08-22 DIAGNOSIS — T8740 Infection of amputation stump, unspecified extremity: Secondary | ICD-10-CM | POA: Diagnosis not present

## 2023-08-22 DIAGNOSIS — B9562 Methicillin resistant Staphylococcus aureus infection as the cause of diseases classified elsewhere: Secondary | ICD-10-CM

## 2023-08-22 DIAGNOSIS — N179 Acute kidney failure, unspecified: Secondary | ICD-10-CM | POA: Diagnosis not present

## 2023-08-22 DIAGNOSIS — E663 Overweight: Secondary | ICD-10-CM | POA: Insufficient documentation

## 2023-08-22 DIAGNOSIS — M86162 Other acute osteomyelitis, left tibia and fibula: Secondary | ICD-10-CM | POA: Diagnosis not present

## 2023-08-22 DIAGNOSIS — N189 Chronic kidney disease, unspecified: Secondary | ICD-10-CM | POA: Diagnosis not present

## 2023-08-22 DIAGNOSIS — R7881 Bacteremia: Secondary | ICD-10-CM

## 2023-08-22 LAB — BASIC METABOLIC PANEL
Anion gap: 6 (ref 5–15)
BUN: 26 mg/dL — ABNORMAL HIGH (ref 6–20)
CO2: 20 mmol/L — ABNORMAL LOW (ref 22–32)
Calcium: 8 mg/dL — ABNORMAL LOW (ref 8.9–10.3)
Chloride: 105 mmol/L (ref 98–111)
Creatinine, Ser: 1.6 mg/dL — ABNORMAL HIGH (ref 0.61–1.24)
GFR, Estimated: 53 mL/min — ABNORMAL LOW (ref >60)
Glucose, Bld: 67 mg/dL — ABNORMAL LOW (ref 70–99)
Potassium: 2.7 mmol/L — CL (ref 3.5–5.1)
Sodium: 131 mmol/L — ABNORMAL LOW (ref 135–145)

## 2023-08-22 LAB — GLUCOSE, CAPILLARY
Glucose-Capillary: 55 mg/dL — ABNORMAL LOW (ref 70–99)
Glucose-Capillary: 89 mg/dL (ref 70–99)
Glucose-Capillary: 84 mg/dL (ref 70–99)
Glucose-Capillary: 61 mg/dL — ABNORMAL LOW (ref 70–99)

## 2023-08-22 LAB — CBC
HCT: 24.3 % — ABNORMAL LOW (ref 39.0–52.0)
Hemoglobin: 8.7 g/dL — ABNORMAL LOW (ref 13.0–17.0)
MCH: 27.3 pg (ref 26.0–34.0)
MCHC: 35.8 g/dL (ref 30.0–36.0)
MCV: 76.2 fL — ABNORMAL LOW (ref 80.0–100.0)
Platelets: 253 10*3/uL (ref 150–400)
RBC: 3.19 MIL/uL — ABNORMAL LOW (ref 4.22–5.81)
RDW: 14.6 % (ref 11.5–15.5)
WBC: 9.5 10*3/uL (ref 4.0–10.5)
nRBC: 0 % (ref 0.0–0.2)

## 2023-08-22 LAB — BLOOD CULTURE ID PANEL (REFLEXED) - BCID2
A.calcoaceticus-baumannii: NOT DETECTED
Bacteroides fragilis: NOT DETECTED
Candida albicans: NOT DETECTED
Candida auris: NOT DETECTED
Candida glabrata: NOT DETECTED
Candida krusei: NOT DETECTED
Candida parapsilosis: NOT DETECTED
Candida tropicalis: NOT DETECTED
Cryptococcus neoformans/gattii: NOT DETECTED
Enterobacter cloacae complex: NOT DETECTED
Enterobacterales: NOT DETECTED
Enterococcus Faecium: NOT DETECTED
Enterococcus faecalis: NOT DETECTED
Escherichia coli: NOT DETECTED
Haemophilus influenzae: NOT DETECTED
Klebsiella aerogenes: NOT DETECTED
Klebsiella oxytoca: NOT DETECTED
Klebsiella pneumoniae: NOT DETECTED
Listeria monocytogenes: NOT DETECTED
Meth resistant mecA/C and MREJ: DETECTED — AB
Neisseria meningitidis: NOT DETECTED
Proteus species: NOT DETECTED
Pseudomonas aeruginosa: NOT DETECTED
Salmonella species: NOT DETECTED
Serratia marcescens: NOT DETECTED
Staphylococcus aureus (BCID): DETECTED — AB
Staphylococcus epidermidis: NOT DETECTED
Staphylococcus lugdunensis: NOT DETECTED
Staphylococcus species: DETECTED — AB
Stenotrophomonas maltophilia: NOT DETECTED
Streptococcus agalactiae: DETECTED — AB
Streptococcus pneumoniae: NOT DETECTED
Streptococcus pyogenes: NOT DETECTED
Streptococcus species: DETECTED — AB

## 2023-08-22 LAB — SYNOVIAL CELL COUNT + DIFF, W/ CRYSTALS
Crystals, Fluid: NONE SEEN
Eosinophils-Synovial: 0 %
Lymphocytes-Synovial Fld: 1 %
Monocyte-Macrophage-Synovial Fluid: 6 %
Neutrophil, Synovial: 93 %
WBC, Synovial: 2113 /mm3 — ABNORMAL HIGH (ref 0–200)

## 2023-08-22 LAB — VANCOMYCIN, RANDOM: Vancomycin Rm: 14 ug/mL

## 2023-08-22 LAB — MAGNESIUM: Magnesium: 2 mg/dL (ref 1.7–2.4)

## 2023-08-22 LAB — PHOSPHORUS: Phosphorus: 2.1 mg/dL — ABNORMAL LOW (ref 2.5–4.6)

## 2023-08-22 LAB — POTASSIUM: Potassium: 3.3 mmol/L — ABNORMAL LOW (ref 3.5–5.1)

## 2023-08-22 LAB — CK: Total CK: 16 U/L — ABNORMAL LOW (ref 49–397)

## 2023-08-22 MED ORDER — POTASSIUM CHLORIDE CRYS ER 20 MEQ PO TBCR
40.0000 meq | EXTENDED_RELEASE_TABLET | ORAL | Status: AC
  Administered 2023-08-22 (×2): 40 meq via ORAL
  Filled 2023-08-22 (×2): qty 2

## 2023-08-22 MED ORDER — POTASSIUM CHLORIDE 10 MEQ/100ML IV SOLN
10.0000 meq | Freq: Once | INTRAVENOUS | Status: AC
  Administered 2023-08-22: 10 meq via INTRAVENOUS
  Filled 2023-08-22: qty 100

## 2023-08-22 MED ORDER — VANCOMYCIN HCL 1750 MG/350ML IV SOLN
1750.0000 mg | INTRAVENOUS | Status: DC
  Filled 2023-08-22: qty 350

## 2023-08-22 MED ORDER — POTASSIUM CHLORIDE 10 MEQ/100ML IV SOLN
10.0000 meq | INTRAVENOUS | Status: AC
  Administered 2023-08-22 (×3): 10 meq via INTRAVENOUS
  Filled 2023-08-22 (×2): qty 100

## 2023-08-22 MED ORDER — SODIUM CHLORIDE 0.9 % IV SOLN
800.0000 mg | Freq: Every day | INTRAVENOUS | Status: DC
  Administered 2023-08-22 – 2023-08-29 (×8): 800 mg via INTRAVENOUS
  Filled 2023-08-22 (×9): qty 16

## 2023-08-22 MED ORDER — POTASSIUM PHOSPHATES 15 MMOLE/5ML IV SOLN
15.0000 mmol | Freq: Once | INTRAVENOUS | Status: AC
  Administered 2023-08-22: 15 mmol via INTRAVENOUS
  Filled 2023-08-22: qty 5

## 2023-08-22 NOTE — Consult Note (Signed)
ORTHOPAEDIC CONSULTATION  REQUESTING PHYSICIAN: Marrion Coy, MD  Chief Complaint:   Left knee pain/swelling and right shoulder pain  History of Present Illness: Richard Mendoza is a 47 y.o. 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  Of note, he has had prior infection requiring irrigation debridement on 05/02/2023.  He reports that the drainage at the distal aspect of his stump is more clear whereas previously it has been purulent.  Recent MRI on 08/20/2023 showed an underlying abscess as well as potential tibial osteomyelitis.  There is also an increased effusion of the knee.  The patient did not wish further surgical intervention and was hoping to be treated conservatively.  I was consulted to rule out septic knee given the MRI findings.  Additionally, the patient notes that 3 days ago, he was attempting to put on his prosthesis and felt a sharp pain in the right shoulder.  Since that time, he has been unable to lift his arm over his head or out to the side without assistance from the other arm.  He is also having significant pain with motion of the shoulder.  Prior to this episode, he did not have any significant dysfunction in the right shoulder.  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   Past Surgical History:  Procedure Laterality Date   AMPUTATION Left 11/07/2020   Procedure: 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;  Surgeon: Linus Galas, DPM;  Location: [facility];  Service: Podiatry;  Laterality: Left;   AMPUTATION Left 11/16/2020    Procedure: AMPUTATION BELOW KNEE;  Surgeon: Annice Needy, MD;  Location: [facility];  Service: Vascular;  Laterality: Left;   ANTERIOR CERVICAL DECOMP/DISCECTOMY FUSION N/A 09/09/2017   Procedure: ANTERIOR CERVICAL DECOMPRESSION/DISCECTOMY FUSION CERVICAL 6- CERVICAL 7;  Surgeon: Coletta Memos, MD;  Location: [facility];  Service: Neurosurgery;  Laterality: N/A;  ANTERIOR CERVICAL DECOMPRESSION/DISCECTOMY FUSION CERVICAL 6- CERVICAL 7   APPENDECTOMY     I & D EXTREMITY Right 10/03/2017   Procedure: IRRIGATION AND DEBRIDEMENT RIGHT WRIST;  Surgeon: Betha Loa, MD;  Location: [facility];  Service: Orthopedics;  Laterality: Right;   I & D EXTREMITY Right 11/26/2018   Procedure: IRRIGATION AND DEBRIDEMENT FASCIA ON RIGHT FOOT;  Surgeon: Gwyneth Revels, DPM;  Location: [facility];  Service: Podiatry;  Laterality: Right;   INCISION AND DRAINAGE Right 03/06/2019   Procedure: INCISION AND DRAINAGE RIGHT FOOT, WITH 4th RAY AMPUTATION;  Surgeon: Gwyneth Revels, DPM;  Location: [facility];  Service: Podiatry;  Laterality: Right;   INCISION AND DRAINAGE Left 11/07/2020   Procedure: INCISION AND DRAINAGE;  Surgeon: Linus Galas, DPM;  Location: [facility];  Service: Podiatry;  Laterality: Left;   INCISION AND DRAINAGE ABSCESS Left 05/02/2023   Procedure: INCISION AND DRAINAGE ABSCESS LEFT LOWER EXTREMITY;  Surgeon: Annice Needy, MD;  Location: [facility];  Service: Vascular;  Laterality: Left;   IRRIGATION AND DEBRIDEMENT FOOT Left 11/11/2020   Procedure: IRRIGATION AND DEBRIDEMENT FOOT;  Surgeon: Linus Galas, DPM;  Location: [facility];  Service: Podiatry;  Laterality: Left;   METATARSAL HEAD EXCISION Right 05/15/2019   Procedure: OSTECTOMY;MET HEAD 5;  Surgeon: Ether Griffins,  Jill Alexanders, DPM;  Location: [facility];  Service: Podiatry;  Laterality: Right;   osteomylitis     ROTATOR CUFF REPAIR Left    Social History   Socioeconomic History   Marital status: Married    Spouse name: Not on file   Number of children: 4   Years of education:  Not on file   Highest education level: Not on file  Occupational History   Not on file  Tobacco Use   Smoking status: Some Days    Current packs/day: 0.00    Average packs/day: 1 pack/day for 17.0 years (17.0 ttl pk-yrs)    Types: Cigarettes    Start date: 01/14/2002    Last attempt to quit: 01/14/2019    Years since quitting: 4.6   Smokeless tobacco: Never  Vaping Use   Vaping status: Never Used  Substance and Sexual Activity   Alcohol use: Yes    Comment: rare   Drug use: No   Sexual activity: Yes  Other Topics Concern   Not on file  Social History Narrative   Not on file   Social Determinants of Health   Financial Resource Strain: Low Risk  (07/24/2022)   Received from [facility], [facility]   Overall Financial Resource Strain (CARDIA)    Difficulty of Paying Living Expenses: Not hard at all  Food Insecurity: No Food Insecurity (06/01/2023)   Hunger Vital Sign    Worried About Running Out of Food in the Last Year: Never true    Ran Out of Food in the Last Year: Never true  Transportation Needs: No Transportation Needs (06/01/2023)   PRAPARE - Administrator, Civil Service (Medical): No    Lack of Transportation (Non-Medical): No  Physical Activity: Not on file  Stress: No Stress Concern Present (07/24/2022)   Received from [facility], [facility] Occup[facility] - Occupational Stress Questionnaire    Feeling of Stress : Not at all  Social Connections: Unknown (03/21/2022)   Received from [facility], [facility]   Social Network    Social Network: Not on file   Family History  Problem Relation Age of Onset   Diabetes Mother    Heart disease Father    Diabetes Father    Arthritis Other    Hyperlipidemia Other    Hypertension Other    Cancer Other        breast   Mental illness Neg Hx    Allergies  Allergen Reactions   Oxycodone Itching   Prior to Admission medications   Medication Sig Start Date End Date  Taking? Authorizing Provider  [facility] SENNA 8.6 MG tablet Take 1 tablet by mouth 2 (two) times daily as needed for constipation. 10/31/17  Yes [provider]  FIASP 100 UNIT/ML SOLN Inject into the skin continuous. Via pump 10/09/20  Yes [provider]  HYDROcodone-acetaminophen (NORCO) 10-325 MG tablet Take 1 tablet by mouth every 6 (six) hours as needed for moderate pain or severe pain. 05/03/23  Yes Sunnie Nielsen, DO  HYDROmorphone (DILAUDID) 2 MG tablet Take 2 mg by mouth every 8 (eight) hours. 05/15/23  Yes [provider]  atorvastatin (LIPITOR) 40 MG tablet Take 40 mg by mouth daily. Patient not taking: Reported on 08/20/2023 08/23/17   [provider]  benazepril (LOTENSIN) 20 MG tablet Take 20 mg by mouth daily. Patient not taking: Reported on 08/20/2023 06/01/21   [provider]  linezolid (ZYVOX) 600 MG tablet Take  1 tablet (600 mg total) by mouth 2 (two) times daily. Patient not taking: Reported on 08/20/2023 06/03/23   Lynn Ito, MD   Recent Labs    08/20/23 0902 08/20/23 1505 08/21/23 0303 08/21/23 0723 08/22/23 0316  WBC 14.4*  --  8.5  --  9.5  HGB 10.7*  --  9.2*  --  8.7*  HCT 32.3*  --  26.9*  --  24.3*  PLT 316  --  248  --  253  K 2.3*   < > 3.4* 3.2* 2.7*  CL 94*   < > 107 107 105  CO2 19*   < > 19* 17* 20*  BUN 32*   < > 33* 30* 26*  CREATININE 2.89*   < > 2.26* 2.08* 1.60*  GLUCOSE 513*   < > 147* 144* 67*  CALCIUM 8.3*   < > 8.1* 7.9* 8.0*   < > = values in this interval not displayed.   DG Shoulder Right  Result Date: 08/21/2023 CLINICAL DATA:  Right shoulder pain.  Fall. EXAM: RIGHT SHOULDER - 2+ VIEW COMPARISON:  None Available. FINDINGS: No acute fracture or dislocation is identified. Mild-to-moderate glenohumeral joint space narrowing and marginal spurring are noted. The acromioclavicular joint is unremarkable. The soft tissues are unremarkable. IMPRESSION: 1. No acute osseous abnormality. 2.  Glenohumeral osteoarthrosis. Electronically Signed   By: Sebastian Ache M.D.   On: 08/21/2023 13:28    MRI L knee (08/20/23): IMPRESSION: 1. New small soft tissue abscess extending inferiorly from the tibial stump. 2. Significant interval increase in T2 hyperintensity and enhancement throughout the remaining tibia, highly suspicious for osteomyelitis. 3. Enlarging knee joint effusion with mild synovial enhancement, nonspecific. Cannot exclude early septic arthritis. No evidence of osteomyelitis within the distal femur, patella or proximal fibula.  Xray L knee (08/20/23); IMPRESSION: Postop changes status post BKA. Lateral cortical disc continuity of the tibia stump suggests the presence of osteomyelitis. Marland Kitchen  Physical Exam: BP (!) 155/87 (BP Location: Left Arm)   Pulse 75   Temp 98.6 F (37 C) (Oral)   Resp 16   Ht 5\' 10"  (1.778 m)   Wt 94.2 kg   SpO2 100%   BMI 29.80 kg/m  General:  Alert, no acute distress Psychiatric:  Patient is competent for consent with normal mood and affect     Orthopedic Exam:  LLE: BKA stump with ulcerated lesion approximately 2-3 cm with mild drainage.  Patient able to extend and flex knee from 0-1 10 without significant difficulty.  Knee joint effusion is present.  No significant erythema about the knee joint itself.  There is significant tenderness to palpation about the patella.  RUE: +ain/pin/u motor SILT r/u/m/ax +rad pulse Unable to forward flex or externally rotate arm actively.  Able to passively flex arm to 60 degrees and externally rotate to 10 degrees before significant pain. 4/5 external rotation strength, 2/5 supraspinous strength   Imaging:  As above: Knee imaging concerning for tibial osteomyelitis and superficial abscess adjacent to the distal stump.  Knee joint effusion is also present.  Shoulder radiographs do not show any significant acute changes.   Knee Joint Aspirate Results: Component     Latest Ref Rng 08/22/2023   Color, Synovial     YELLOW  YELLOW   Appearance-Synovial     CLEAR  CLOUDY !   Crystals, Fluid NO CRYSTALS SEEN   WBC, Synovial     0 - 200 /cu mm 2,113 (H)   Neutrophil, Synovial     %  93   Lymphocytes-Synovial Fld     % 1   Monocyte-Macrophage-Synovial Fluid     % 6   Eosinophils-Synovial     % 0        Assessment/Plan: 47 year old male with history of left BKA now with concern for tibial osteomyelitis and knee effusion.  He is also having right shoulder pain after an acute, traumatic event with current signs and symptoms concerning for rotator cuff tear. 1.  I discussed the imaging findings with the patient.  We agreed to proceed with an aspiration of the left knee joint.  Please see procedure note below for further details.  2.  Aspirate results showed 2113 WBCs with 93% PMNs without crystals.  His clinical exam in addition to these results suggest that this is not consistent with a septic knee. There may be some other underlying inflammatory process given the high percentage of PMNs.  Can follow-up cultures to ensure no infection.  3. We discussed the patient's clinical findings regarding the right shoulder.  There is concern for an acute full-thickness rotator cuff tear.  I am recommending an MRI to further evaluate these findings. If this does show a full-thickness rotator cuff tear, we would likely need to manage this as an outpatient.  He would likely need to have cleared infections and appropriate blood glucose control prior to undergoing surgical intervention.  4.  Will plan to follow peripherally.  Please page with any further questions.   Procedure Note - Left Knee Aspiration I reviewed with the patient the procedure of L knee joint aspiration and discussed the risks, benefits, and alternative treatments. We discussed the potential risks of infection, increased pain, and incomplete relief or temporary relief of symptoms. Verbal consent was obtained.  A time-out was  conducted to verify correct patient identity, procedure to be performed, and correct side and site. The skin was marked and then prepped in usual sterile fashion. Superolateral approach was used, and I obtained ~35cc of hazy, yellow synovial fluid.  Fluid did not appear grossly purulent.  The patient tolerated the procedure adequately. Aspiration sent for cell count, culture/gram stain, and crystals.    Signa Kell   08/22/2023 2:54 PM

## 2023-08-22 NOTE — Inpatient Diabetes Management (Signed)
Inpatient Diabetes Program Recommendations  AACE/ADA: New Consensus Statement on Inpatient Glycemic Control (2015)  Target Ranges:  Prepandial:   less than 140 mg/dL      Peak postprandial:   less than 180 mg/dL (1-2 hours)      Critically ill patients:  140 - 180 mg/dL    Latest Reference Range & Units 08/22/23 03:09  Glucose-Capillary 70 - 99 mg/dL 55 (L)  (L): Data is abnormally low   Admit with: Wound infection in BKA stump    Home DM Meds: Omni Pod Insulin Pump with Fiasp Insulin                              Dexcom G6 CGM  Current Orders: Insulin Pump    HYPO at 3am  HYPO at 8am (Dexcom reading 52)   Addendum 11am--Met w/ pt at bedside.  His insulin pump was currently disconnected from the insertion site b/c his Dexcom G7 sensor reading was in the low 80's.  Pt told me he had HYPO earlier this AM (3am and 8am) and he has been temporarily disconnecting when the glucose is low and re-connecting once the glucose goes back up to 90 or higher.  Current Basal rate on his Medtronic insulin pump is 3.6 units/hr.  I asked pt if he knows how to create and use a temporary basal rate and he stated "No".  Pt went on to tell me that he has a brand new Omni Pod insulin pump at home but that no one from his ENDO office will call him back to help him get the new pump set up.  I discussed with pt that the RNs will be checking his CBGs with the hospital meter and pt was OK with that--Pt also using Dexcom sensor readings at bedside.  I told pt I would call his ENDO office to see if I could speak with someone about having him change/reduce the basal rate temporarily while pt having issues with Hypoglycemia.  I called ENDO office and left message with office rep for call back from provider or CDE for assistance.  Office rep told me pt never called the office back since June to get help with set up of new insulin pump.  Pt also was "No Show" at last ENDO appt in March 2024.    Diabetes Coordinator met  with pt in June and July during Admissions and provided counseling Pt was supposed to switch from the Medtronic Insulin Pump to the [employer] Insulin Pump but unable to due to insurance coverage.  Basal insulin  12A      3.6 units/hour Total daily basal insulin: 86.4 units/24 hours  Carb Coverage 1:4       1 unit for every 4 grams of carbohydrates  Insulin Sensitivity 1:20     1 unit drops blood glucose 20 mg/dl  Target Glucose Goals 16X 100-110 mg/dl     --Will follow patient during hospitalization--  Ambrose Finland RN, MSN, CDCES Diabetes Coordinator Inpatient Glycemic Control Team Team Pager: [phone] (8a-5p)

## 2023-08-22 NOTE — Progress Notes (Signed)
Hypoglycemic Event   CBG: 52 per pt's dexcom  Treament: Pt is eating breakfast at this time   Symptoms: NONE  Follow-up CBG: Time: 0805  CBG Result 70  Possible Reasons for Event: UNKNOWN   Comments/MD Notification: Pt's glucose reading with Dexcom read 52. Pt on home insulin pump which was off at the time of glucose check. Dietary was entering the room with pt's meal tray. Insulin Pump has now been cut back on

## 2023-08-22 NOTE — Progress Notes (Signed)
Date of Admission:  08/20/2023     ID: Richard Mendoza is a 47 y.o. 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)   Tobacco abuse   Overweight (BMI 25.0-29.9)    Subjective: C/o rt shoulder pain  Medications:   vitamin C  500 mg Oral BID   atorvastatin  40 mg Oral Daily   Chlorhexidine Gluconate Cloth  6 each Topical Daily   enoxaparin (LOVENOX) injection  40 mg Subcutaneous Q24H   insulin pump   Subcutaneous TID WC, HS, 0200   lidocaine  1 patch Transdermal Q24H   multivitamin with minerals  1 tablet Oral Daily   mupirocin ointment  1 Application Nasal BID   nicotine  14 mg Transdermal Daily   Ensure Max Protein  11 oz Oral BID   zinc sulfate  220 mg Oral Daily    Objective: Vital signs in last 24 hours: Patient Vitals for the past 24 hrs:  BP Temp Temp src Pulse Resp SpO2 Weight  08/22/23 1631 (!) 179/96 97.7 F (36.5 C) Oral 87 18 100 % --  08/22/23 0756 (!) 155/87 98.6 F (37 C) Oral 75 16 100 % --  08/22/23 0351 (!) 147/80 98.8 F (37.1 C) Oral 78 18 100 % 94.2 kg  08/21/23 1948 (!) 111/98 98.2 F (36.8 C) Oral 86 18 99 % --       PHYSICAL EXAM:  General: Alert, cooperative, no distress, appears stated age.  Head: Normocephalic, without obvious abnormality, atraumatic. Eyes: Conjunctivae clear, anicteric sclerae. Pupils are equal ENT Nares normal. No drainage or sinus tenderness. Lips, mucosa, and tongue normal. No Thrush Neck: Supple, symmetrical, no adenopathy, thyroid: non tender no carotid bruit and no JVD. Back: No CVA tenderness. Lungs: Clear to auscultation bilaterally. No Wheezing or Rhonchi.  No rales. Heart: Regular rate and rhythm, no murmur, rub or gallop. Abdomen: Soft, non-tender,not distended. Bowel sounds normal. No masses Extremities: rt shoulder restricted mobility Skin: No rashes or lesions. Or bruising Lymph: Cervical, supraclavicular normal. Neurologic: Grossly non-focal  Lab Results    Latest Ref Rng & Units 08/22/2023    3:16 AM 08/21/2023    3:03 AM 08/20/2023    9:02 AM  CBC  WBC 4.0 - 10.5 K/uL 9.5  8.5  14.4   Hemoglobin 13.0 - 17.0 g/dL 8.7  9.2  16.1   Hematocrit 39.0 - 52.0 % 24.3  26.9  32.3   Platelets 150 - 400 K/uL 253  248  316        Latest Ref Rng & Units 08/22/2023    2:45 PM 08/22/2023    3:16 AM 08/21/2023    7:23 AM  CMP  Glucose 70 - 99 mg/dL  67  096   BUN 6 - 20 mg/dL  26  30   Creatinine 0.45 - 1.24 mg/dL  4.09  8.11   Sodium 914 - 145 mmol/L  131  129   Potassium 3.5 - 5.1 mmol/L 3.3  2.7  3.2   Chloride 98 - 111 mmol/L  105  107   CO2 22 - 32 mmol/L  20  17   Calcium 8.9 -  10.3 mg/dL  8.0  7.9       Microbiology: MRSA bc Wc pending Studies/Results: DG Shoulder Right  Result Date: 08/21/2023 CLINICAL DATA:  Right shoulder pain.  Fall. EXAM: RIGHT SHOULDER - 2+ VIEW COMPARISON:  None Available. FINDINGS: No acute fracture or dislocation is identified. Mild-to-moderate glenohumeral joint space narrowing and marginal spurring are noted. The acromioclavicular joint is unremarkable. The soft tissues are unremarkable. IMPRESSION: 1. No acute osseous abnormality. 2. Glenohumeral osteoarthrosis. Electronically Signed   By: Sebastian Ache M.D.   On: 08/21/2023 13:28     Assessment/Plan: ?MRSA bacteremia Source lef BKA site infection On Daptomycin   Left BKA stump infection with osteomyelitis - seen by vascular- will discuss with them regarding debridment- currently no plans He will need 6 weeks of IV followed by indefinite suppressive therapy because of repeated MRSA infection  Aspiration of left knee ( 2113 wbc) Culture pending-  less likely to be infection   Anemia   DM   HTN   AKI on CKDimproving Hypokalemia improving  PICC can be placed tomorrow if blood culture from 10/2 is negative Pt awaiting MRI rt shoulder for severe shoulder pain and restricted mobility  Discussed the management with patient

## 2023-08-22 NOTE — Progress Notes (Signed)
Progress Note   Patient: Richard Mendoza ZDG:[id number] DOB: 06-15-1976 DOA: 08/20/2023     2 DOS: the patient was seen and examined on 08/22/2023   Brief hospital course: Richard Mendoza is a 47 y.o. male with medical history significant of uncontrolled type 2 diabetes, tobacco abuse, hypertension, hyperlipidemia, history of BKA presenting with diabetic stump infection.  Patient reports worsening drainage at the base of his stump over the past 1 to 2 weeks.  Patient is seen by vascular surgery, started on broad-spectrum antibiotics with cefepime, Flagyl and vancomycin.  MRI of the left lower extremity showed soft tissue abscess, tibial osteomyelitis, knee joint effusion. Blood culture 1 set positive for Staph aureus, epidermidis and strep agalactiae.  Patient is seen by ID, treated with vancomycin.   Principal Problem:   Amputation stump infection (HCC) Active Problems:   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)   Tobacco abuse   Anemia of chronic disease   Osteomyelitis (HCC)   Acute kidney injury superimposed on chronic kidney disease (HCC)   Hyperglycemia due to type 2 diabetes mellitus (HCC)   Gastro-esophageal reflux disease without esophagitis   Cellulitis of left lower extremity   Assessment and Plan: 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) with abscess. Tibial osteomyelitis. Large left knee joint effusion, possible septic joint. MRSA bacteremia. Recurrent left stump infection with noted recent admission July of this year for similar issue Evaluated by vascular surgery with patient deferring conversion from BKA to AKA Blood culture 1 set positive for Staph aureus, epidermidis and strep agalactiae.  repeated blood culture was sent out today. Patient currently receiving vancomycin.  Discussed with orthopedic surgery, will perform knee tap to rule out septic joint.  Vascular surgery will consider options after knee tap.   Acute kidney injury  on chronic kidney disease stage IIIa. Hyponatremia. Metabolic acidosis. Hypokalemia. Hypophosphatemia. Renal function has back to baseline, continue replete potassium.  Continue given phosphate.   Hyperglycemia due to uncontrolled type 2 diabetes mellitus (HCC) Patient is placed on an drip, glucose much better today.  Restarted insulin pump.   Tobacco abuse Advised to quit, continue nicotine patch.   Gastro-esophageal reflux disease without esophagitis PPI   Anemia of chronic disease Monitor CBC, check iron B12 level.   Left shoulder pain. No acute changes on x-ray.      Subjective:  Patient feels better today, had some loose stools, but no abdominal pain nausea vomiting.  Physical Exam: Vitals:   08/21/23 1306 08/21/23 1948 08/22/23 0351 08/22/23 0756  BP: (!) 158/96 (!) 111/98 (!) 147/80 (!) 155/87  Pulse: 87 86 78 75  Resp: 20 18 18 16   Temp: 98.7 F (37.1 C) 98.2 F (36.8 C) 98.8 F (37.1 C) 98.6 F (37 C)  TempSrc: Oral Oral Oral Oral  SpO2: 100% 99% 100% 100%  Weight:   94.2 kg   Height:       General exam: Appears calm and comfortable  Respiratory system: Clear to auscultation. Respiratory effort normal. Cardiovascular system: S1 & S2 heard, RRR. No JVD, murmurs, rubs, gallops or clicks. No pedal edema. Gastrointestinal system: Abdomen is nondistended, soft and nontender. No organomegaly or masses felt. Normal bowel sounds heard. Central nervous system: Alert and oriented. No focal neurological deficits. Extremities: Symmetric 5 x 5 power. Skin: No rashes, lesions or ulcers Psychiatry: Judgement and insight appear normal. Mood & affect appropriate.    Data Reviewed:  Lab results reviewed.  Family Communication: None  Disposition: Status is: Inpatient  Remains inpatient appropriate because: Severity of disease, IV treatment.     Time spent: 35 minutes  Author: Marrion Coy, MD 08/22/2023 12:41 PM  For on call review [email or web address].

## 2023-08-22 NOTE — TOC Progression Note (Signed)
Transition of Care [facility]) - Progression Note    Patient Details  Name: Richard Mendoza MRN: [id number] Date of Birth: [birth year]  Transition of [facility]) CM/SW Contact  Chapman Fitch, RN Phone Number: 08/22/2023, 11:28 AM  Clinical Narrative:     Per ID patient will require IV antibiotics at discharge  Met with patient at bedside.  He is in agreement to home IV antibiotics, and states he has done them before in the past.  He state he does not have a preference of infusion company.   Referral made to [facility] with [employer]    Expected Discharge Plan: Home/Self Care Barriers to Discharge: Continued Medical Work up  Expected Discharge Plan and Services     Post Acute Care Choice: NA Living arrangements for the past 2 months: Single Family Home                                       Social Determinants of Health (SDOH) Interventions SDOH Screenings   Food Insecurity: No Food Insecurity (06/01/2023)  Housing: High Risk (06/01/2023)  Transportation Needs: No Transportation Needs (06/01/2023)  Utilities: Not At Risk (06/01/2023)  Depression (PHQ2-9): Low Risk  (09/21/2019)  Financial Resource Strain: Low Risk  (07/24/2022)   Received from [facility], [facility]  Social Connections: Unknown (03/21/2022)   Received from [facility], [facility]  Stress: No Stress Concern Present (07/24/2022)   Received from [facility], [facility]  Tobacco Use: High Risk (06/01/2023)    Readmission Risk Interventions    08/21/2023    9:03 AM  Readmission Risk Prevention Plan  Transportation Screening Complete  PCP or Specialist Appt within 3-5 Days Complete  Social Work Consult for Recovery Care Planning/Counseling Complete  Palliative Care Screening Not Applicable  Medication Review Oceanographer) Complete

## 2023-08-22 NOTE — Progress Notes (Signed)
Hypoglycemic Event  CBG: 58 per pts dexcom  Treatment: 8 oz juice/soda  Symptoms: None  Follow-up CBG: Time:0331 CBG Result:89  Possible Reasons for Event: Unknown  Comments/MD notified: Blood sugar checked on hospital glucometer and read 55. Patient on home insulin pump with continuous infusion of 2 units/hr, plus correction dose if blood sugar >113. Pt cut insulin pump off completely. Protocol followed.    Richard Mendoza

## 2023-08-22 NOTE — Consult Note (Signed)
Pharmacy Antibiotic Note  Richard Mendoza is a 47 y.o. 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 Pharmacy has been consulted for daptomycin dosing. Patient is also ordered metronidazole.Labs on admission notable for AKI with Scr 2.89. Patient appears to have component of CKD at baseline with Scr 1 . 6 - 1.8 in 06/07 of 2024. 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  Today, 08/22/2023 Vancomycin discontinued and replaced with daptomycin  WBC 9.5 today Afebrile  SCr improving at 1.6 today   Plan Initiate daptomycin 800 mg (8 mg/kg) daily  Baseline CK 16 Will monitor CK weekly while patient is on daptomycin  Patient is on atorvastatin 40 mg daily  F/u blood culture susceptibilities and identification of GPCs on wound culture F/u daptomycin MIC - sent today  Pharmacy will continue to monitor and dose adjust appropriately   Height: 5\' 10"  (177.8 cm) Weight: 94.2 kg (207 lb 10.8 oz) IBW/kg (Calculated) : 73  Temp (24hrs), Avg:98.5 F (36.9 C), Min:98.2 F (36.8 C), Max:98.8 F (37.1 C)  Recent Labs  Lab 08/20/23 0902 08/20/23 1505 08/20/23 1918 08/20/23 2259 08/21/23 0303 08/21/23 0723 08/22/23 0316  WBC 14.4*  --   --   --  8.5  --  9.5  CREATININE 2.89* 2.75* 2.63* 2.40* 2.26* 2.08* 1.60*  LATICACIDVEN 1.2 1.4  --   --   --   --   --   VANCORANDOM  --   --   --   --   --   --  14    Estimated Creatinine Clearance: 65.8 mL/min (A) (by C-G formula based on SCr of 1.6 mg/dL (H)).    Antimicrobials this admission: Cefepime 10/1 x 1   Metronidazole 10/1 x 1  Vancomycin 10/1 >> 10/2 Daptomycin 10/3 >>  Dose adjustments this admission: N/A  Microbiology results: 10/1 MRSA nares: detected 10/1 BCx: MRSA + group B strep  10/2 BCx: NGTD 10/2 Wcx: GPCs on gram stain  10/3 Synovial fluid: sent   Thank you for allowing  pharmacy to be a part of this patient's care.  Littie Deeds, PharmD PGY1 Pharmacy Resident 08/22/2023 4:26 PM

## 2023-08-23 DIAGNOSIS — T8740 Infection of amputation stump, unspecified extremity: Secondary | ICD-10-CM | POA: Diagnosis not present

## 2023-08-23 DIAGNOSIS — D509 Iron deficiency anemia, unspecified: Secondary | ICD-10-CM | POA: Insufficient documentation

## 2023-08-23 DIAGNOSIS — M86162 Other acute osteomyelitis, left tibia and fibula: Secondary | ICD-10-CM | POA: Diagnosis not present

## 2023-08-23 DIAGNOSIS — N179 Acute kidney failure, unspecified: Secondary | ICD-10-CM | POA: Diagnosis not present

## 2023-08-23 DIAGNOSIS — N189 Chronic kidney disease, unspecified: Secondary | ICD-10-CM | POA: Diagnosis not present

## 2023-08-23 LAB — CBC
HCT: 24.2 % — ABNORMAL LOW (ref 39.0–52.0)
Hemoglobin: 8.1 g/dL — ABNORMAL LOW (ref 13.0–17.0)
MCH: 26.7 pg (ref 26.0–34.0)
MCHC: 33.5 g/dL (ref 30.0–36.0)
MCV: 79.9 fL — ABNORMAL LOW (ref 80.0–100.0)
Platelets: 244 10*3/uL (ref 150–400)
RBC: 3.03 MIL/uL — ABNORMAL LOW (ref 4.22–5.81)
RDW: 15.2 % (ref 11.5–15.5)
WBC: 8 10*3/uL (ref 4.0–10.5)
nRBC: 0 % (ref 0.0–0.2)

## 2023-08-23 LAB — BASIC METABOLIC PANEL
Anion gap: 10 (ref 5–15)
BUN: 23 mg/dL — ABNORMAL HIGH (ref 6–20)
CO2: 21 mmol/L — ABNORMAL LOW (ref 22–32)
Calcium: 8.2 mg/dL — ABNORMAL LOW (ref 8.9–10.3)
Chloride: 103 mmol/L (ref 98–111)
Creatinine, Ser: 1.4 mg/dL — ABNORMAL HIGH (ref 0.61–1.24)
GFR, Estimated: >60 mL/min (ref >60)
Glucose, Bld: 74 mg/dL (ref 70–99)
Potassium: 3.5 mmol/L (ref 3.5–5.1)
Sodium: 134 mmol/L — ABNORMAL LOW (ref 135–145)

## 2023-08-23 LAB — GLUCOSE, CAPILLARY
Glucose-Capillary: 98 mg/dL (ref 70–99)
Glucose-Capillary: 48 mg/dL — ABNORMAL LOW (ref 70–99)
Glucose-Capillary: 153 mg/dL — ABNORMAL HIGH (ref 70–99)
Glucose-Capillary: 164 mg/dL — ABNORMAL HIGH (ref 70–99)
Glucose-Capillary: 160 mg/dL — ABNORMAL HIGH (ref 70–99)
Glucose-Capillary: 98 mg/dL (ref 70–99)

## 2023-08-23 LAB — MAGNESIUM: Magnesium: 2 mg/dL (ref 1.7–2.4)

## 2023-08-23 LAB — PHOSPHORUS: Phosphorus: 2.5 mg/dL (ref 2.5–4.6)

## 2023-08-23 MED ORDER — POTASSIUM CHLORIDE CRYS ER 20 MEQ PO TBCR
40.0000 meq | EXTENDED_RELEASE_TABLET | ORAL | Status: AC
  Administered 2023-08-23: 40 meq via ORAL
  Filled 2023-08-23: qty 2

## 2023-08-23 MED ORDER — POLYSACCHARIDE IRON COMPLEX 150 MG PO CAPS
150.0000 mg | ORAL_CAPSULE | Freq: Every day | ORAL | Status: DC
  Administered 2023-08-23 – 2023-08-30 (×7): 150 mg via ORAL
  Filled 2023-08-23 (×8): qty 1

## 2023-08-23 MED ORDER — SODIUM BICARBONATE 650 MG PO TABS
650.0000 mg | ORAL_TABLET | Freq: Two times a day (BID) | ORAL | Status: DC
  Administered 2023-08-23 – 2023-08-24 (×3): 650 mg via ORAL
  Filled 2023-08-23 (×3): qty 1

## 2023-08-23 MED ORDER — POTASSIUM CHLORIDE CRYS ER 20 MEQ PO TBCR
40.0000 meq | EXTENDED_RELEASE_TABLET | Freq: Once | ORAL | Status: AC
  Administered 2023-08-23: 40 meq via ORAL
  Filled 2023-08-23: qty 2

## 2023-08-23 NOTE — TOC Progression Note (Signed)
Transition of Care [facility]) - Progression Note    Patient Details  Name: Richard Mendoza MRN: [id number] Date of Birth: 18-Jun-1976  Transition of Care [facility]) CM/SW Contact  Chapman Fitch, RN Phone Number: 08/23/2023, 2:33 PM  Clinical Narrative:     Housing resources added to AVS   Expected Discharge Plan: Home/Self Care Barriers to Discharge: Continued Medical Work up  Expected Discharge Plan and Services     Post Acute Care Choice: NA Living arrangements for the past 2 months: Single Family Home                                       Social Determinants of Health (SDOH) Interventions SDOH Screenings   Food Insecurity: No Food Insecurity (06/01/2023)  Housing: High Risk (06/01/2023)  Transportation Needs: No Transportation Needs (06/01/2023)  Utilities: Not At Risk (06/01/2023)  Depression (PHQ2-9): Low Risk  (09/21/2019)  Financial Resource Strain: Low Risk  (07/24/2022)   Received from [facility], [facility]  Social Connections: Unknown (03/21/2022)   Received from [facility], [facility]  Stress: No Stress Concern Present (07/24/2022)   Received from [facility], [facility]  Tobacco Use: High Risk (06/01/2023)    Readmission Risk Interventions    08/21/2023    9:03 AM  Readmission Risk Prevention Plan  Transportation Screening Complete  PCP or Specialist Appt within 3-5 Days Complete  Social Work Consult for Recovery Care Planning/Counseling Complete  Palliative Care Screening Not Applicable  Medication Review Oceanographer) Complete

## 2023-08-23 NOTE — Progress Notes (Signed)
Progress Note   Patient: Richard Mendoza ZHY:[id number] DOB: Oct 01, 1976 DOA: 08/20/2023     3 DOS: the patient was seen and examined on 08/23/2023   Brief hospital course: Quantarius Swider is a 47 y.o. male with medical history significant of uncontrolled type 2 diabetes, tobacco abuse, hypertension, hyperlipidemia, history of BKA presenting with diabetic stump infection.  Patient reports worsening drainage at the base of his stump over the past 1 to 2 weeks.  Patient is seen by vascular surgery, started on broad-spectrum antibiotics with cefepime, Flagyl and vancomycin.  MRI of the left lower extremity showed soft tissue abscess, tibial osteomyelitis, knee joint effusion. Blood culture 1 set positive for Staph aureus, epidermidis and strep agalactiae.  Patient is seen by ID, treated with vancomycin then changed to daptomycin.   Principal Problem:   Amputation stump infection (HCC) Active Problems:   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)   Tobacco abuse   Anemia of chronic disease   Osteomyelitis (HCC)   Acute kidney injury superimposed on chronic kidney disease (HCC)   Hyperglycemia due to type 2 diabetes mellitus (HCC)   Gastro-esophageal reflux disease without esophagitis   Cellulitis of left lower extremity   Overweight (BMI 25.0-29.9)   MRSA bacteremia   Assessment and Plan: nfection of amputation stump of left lower extremity (HCC) with abscess. Tibial osteomyelitis. Large left knee joint effusion, possible septic joint. MRSA bacteremia. Recurrent left stump infection with noted recent admission July of this year for similar issue Evaluated by vascular surgery with patient deferring conversion from BKA to AKA Blood culture 1 set positive for Staph aureus, epidermidis and strep agalactiae.  repeated blood culture no growth. Patient is seen by orthopedics, knee tap showed increased WBC in the joint, but culture pending. Followed by ID, antibiotic switched to  daptomycin 10/3.    Acute kidney injury on chronic kidney disease stage IIIa. Hyponatremia. Metabolic acidosis. Hypokalemia. Hypophosphatemia. Condition improved, potassium 3.5 today, will give additional 80 mEq of oral KCl.  Also give sodium bicarb for mild metabolic acidosis.     Hyperglycemia due to uncontrolled type 2 diabetes mellitus (HCC) Hypoglycemia. Glucose running low, decrease dose on insulin pump.   Tobacco abuse Advised to quit, continue nicotine patch.   Gastro-esophageal reflux disease without esophagitis PPI   Anemia of chronic disease Monitor CBC, check iron B12 level.   Left shoulder pain. No acute changes on x-ray.        Subjective:  Patient feels much better today, had a normal bowel movement today.  No nausea vomiting.  No fever or chills.  Physical Exam: Vitals:   08/22/23 1631 08/22/23 2002 08/23/23 0300 08/23/23 0410  BP: (!) 179/96 (!) 190/95  (!) 145/84  Pulse: 87 83  74  Resp: 18 18  18   Temp: 97.7 F (36.5 C) 99.6 F (37.6 C)  98.8 F (37.1 C)  TempSrc: Oral Oral  Oral  SpO2: 100% 99%  100%  Weight:   92.5 kg   Height:       General exam: Appears calm and comfortable  Respiratory system: Clear to auscultation. Respiratory effort normal. Cardiovascular system: S1 & S2 heard, RRR. No JVD, murmurs, rubs, gallops or clicks. No pedal edema. Gastrointestinal system: Abdomen is nondistended, soft and nontender. No organomegaly or masses felt. Normal bowel sounds heard. Central nervous system: Alert and oriented. No focal neurological deficits. Extremities: Left BKA. Skin: No rashes, lesions or ulcers Psychiatry: Judgement and insight appear normal. Mood & affect appropriate.  Data Reviewed:  Lab results reviewed.  Family Communication: None  Disposition: Status is: Inpatient Remains inpatient appropriate because: Severity of disease, IV treatment.     Time spent: 35 minutes  Author: Marrion Coy, MD 08/23/2023 10:37  AM  For on call review [email or web address].

## 2023-08-23 NOTE — Progress Notes (Signed)
PHARMACY CONSULT NOTE FOR:  OUTPATIENT  PARENTERAL ANTIBIOTIC THERAPY (OPAT)  Indication: MRSA bacteremia / Osteomyelitis  Regimen: Daptomycin 800 mg Q24H End date: 10/01/2023  While on daptomycin closely monitor CK and watch for rhabdomyolysis, eosinophilia, & pneumonia  Adjust Daptomycin frequency if CrCl < 30 mL/min Labs - Once weekly:  CBC/D, BMP, and CPK  Labs - Once weekly: ESR and CRP  Fax weekly lab results promptly to [phone]  Please pull PIC at completion of IV antibiotics   IV antibiotic discharge orders are pended. To discharging provider: please sign these orders via discharge navigator,  Select New Orders & click on the button choice - Manage This Unsigned Work.    Thank you for allowing pharmacy to be a part of this patient's care.  Littie Deeds, PharmD PGY1 Pharmacy Resident 08/23/2023, 4:42 PM

## 2023-08-23 NOTE — Progress Notes (Signed)
Date of Admission:  08/20/2023     ID: Richard Mendoza is a 47 y.o. 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)   Tobacco abuse   Overweight (BMI 25.0-29.9)   MRSA bacteremia   Iron deficiency anemia, unspecified    Subjective: Pt c/o severe pain  Medications:   vitamin C  500 mg Oral BID   atorvastatin  40 mg Oral Daily   Chlorhexidine Gluconate Cloth  6 each Topical Daily   enoxaparin (LOVENOX) injection  40 mg Subcutaneous Q24H   insulin pump   Subcutaneous TID WC, HS, 0200   iron polysaccharides  150 mg Oral Daily   lidocaine  1 patch Transdermal Q24H   multivitamin with minerals  1 tablet Oral Daily   mupirocin ointment  1 Application Nasal BID   nicotine  14 mg Transdermal Daily   potassium chloride  40 mEq Oral Once   Ensure Max Protein  11 oz Oral BID   sodium bicarbonate  650 mg Oral BID   zinc sulfate  220 mg Oral Daily    Objective: Vital signs in last 24 hours: Patient Vitals for the past 24 hrs:  BP Temp Temp src Pulse Resp SpO2 Weight  08/23/23 1612 (!) 176/92 98 F (36.7 C) -- 74 18 99 % --  08/23/23 0855 (!) 170/83 98 F (36.7 C) -- 81 18 99 % --  08/23/23 0410 (!) 145/84 98.8 F (37.1 C) Oral 74 18 100 % --  08/23/23 0300 -- -- -- -- -- -- 92.5 kg  08/22/23 2002 (!) 190/95 99.6 F (37.6 C) Oral 83 18 99 % --       PHYSICAL EXAM:  General: in distress, appears stated age.  Lungs: Clear to auscultation bilaterally. No Wheezing or Rhonchi. No rales. Heart: Regular rate and rhythm, no murmur, rub or gallop. Abdomen: did not examine Extremities: rt shoulder painfully restricted  mobility Skin: No rashes or lesions. Or bruising Lymph: Cervical, supraclavicular normal. Neurologic: Grossly non-focal  Lab Results    Latest Ref Rng & Units 08/23/2023    4:31 AM 08/22/2023    3:16 AM 08/21/2023    3:03 AM  CBC  WBC 4.0 - 10.5 K/uL 8.0  9.5  8.5   Hemoglobin 13.0 - 17.0 g/dL 8.1  8.7  9.2   Hematocrit 39.0 - 52.0 % 24.2  24.3  26.9   Platelets 150 - 400 K/uL 244  253  248        Latest Ref Rng & Units 08/23/2023    4:31 AM 08/22/2023    2:45 PM 08/22/2023    3:16 AM  CMP  Glucose 70 - 99 mg/dL 74   67   BUN 6 - 20 mg/dL 23   26   Creatinine 0.86 - 1.24 mg/dL 5.78   4.69   Sodium 629 - 145 mmol/L 134   131   Potassium 3.5 - 5.1 mmol/L 3.5  3.3  2.7   Chloride 98 - 111 mmol/L 103   105   CO2 22 - 32 mmol/L 21   20   Calcium 8.9 - 10.3 mg/dL 8.2   8.0  Microbiology: MRSA bc Wc pending Studies/Results: MR SHOULDER RIGHT WO CONTRAST  Result Date: 08/23/2023 CLINICAL DATA:  Sharp pain in the right shoulder EXAM: MRI OF THE RIGHT SHOULDER WITHOUT CONTRAST TECHNIQUE: Multiplanar, multisequence MR imaging of the shoulder was performed. No intravenous contrast was administered. COMPARISON:  Report from 01/28/2019, and radiographs 08/21/2023 FINDINGS: Rotator cuff: Prominent distal anterior supraspinatus tendinopathy with partial thickness articular surface tearing extending towards but not definitely reaching the bursal surface. Moderate to prominent subscapularis and mild infraspinatus tendinopathy. Muscles: Abnormal intramuscular edema anteriorly and laterally in the deltoid muscle, and along the periphery of the posterior deltoid. Edema tracks along the scapular margin of the infraspinatus. Biceps long head: Moderate tendinopathy of the intra-articular segment. Acromioclavicular Joint: Moderate inferior spurring of the [facility] joint compatible with moderate degenerative arthropathy. Type II acromion. Extensive fluid in the subacromial subdeltoid bursa compatible with  bursitis. Glenohumeral Joint: Substantial joint effusion. Mild degenerative chondral thinning. Fluid and potentially synovitis in the rotator interval. Labrum:  Grossly intact. Bones: No significant extra-articular osseous abnormalities identified. Other: No supplemental non-categorized findings. IMPRESSION: 1. Prominent distal anterior supraspinatus tendinopathy with partial thickness articular surface tearing extending towards but not definitely reaching the bursal surface. 2. Moderate to prominent subscapularis and mild infraspinatus tendinopathy. 3. Moderate tendinopathy of the intra-articular segment of the long head of the biceps. 4. Moderate degenerative arthropathy of the [facility] joint. 5. Extensive fluid in the subacromial subdeltoid bursa compatible with bursitis. 6. Substantial joint effusion with fluid and potentially synovitis in the rotator interval. 7. Abnormal intramuscular edema anteriorly and laterally in the deltoid muscle, and along the periphery of the posterior deltoid. Appearance favors muscle strain/tear. Electronically Signed   By: Gaylyn Rong M.D.   On: 08/23/2023 10:08     Assessment/Plan: ?MRSA bacteremia Source lef BKA site infection On Daptomycin   Left BKA stump infection with osteomyelitis - seen by vascular- currently no plans for surgical intervention He will need 6 weeks of IV daptomycin followed by indefinite suppressive therapy because of repeated MRSA infection  Aspiration of left knee ( 2113 wbc) Culture pending- less likely to be infection   Anemia   DM   HTN   AKI on CKDimproving Hypokalemia improving  Rt should pain and restricted mobility- bursitis, partial tear of tendon Followed by ortho  PICC can be placed tomorrow if blood culture from 10/2 is negative Pt awaiting MRI rt shoulder for severe shoulder pain and restricted mobility  Discussed the management with patient and hospitalist  On call ID available by phone this weekend for urgent  issues

## 2023-08-23 NOTE — Inpatient Diabetes Management (Addendum)
Inpatient Diabetes Program Recommendations  AACE/ADA: New Consensus Statement on Inpatient Glycemic Control (2015)  Target Ranges:  Prepandial:   less than 140 mg/dL      Peak postprandial:   less than 180 mg/dL (1-2 hours)      Critically ill patients:  140 - 180 mg/dL    Latest Reference Range & Units 08/22/23 03:09 08/22/23 03:31 08/22/23 12:18 08/22/23 16:23  Glucose-Capillary 70 - 99 mg/dL 55 (L) 89 84 61 (L)  (L): Data is abnormally low  Latest Reference Range & Units 08/23/23 05:44 08/23/23 07:51 08/23/23 08:55  Glucose-Capillary 70 - 99 mg/dL 98 48 (L) 191 (H)  (L): Data is abnormally low (H): Data is abnormally high   Admit with: Wound infection in BKA stump      Home DM Meds: Medtronic Insulin Pump with Fiasp Insulin                              Dexcom G6 CGM   Current Orders: Insulin Pump     This Diabetes Coordinator spoke with Milana Na, CDCES at [facility] about this pt.  Reviewed continued issues with Hypoglycemia while pt using his home insulin pump.  Pt showed me yesterday that his basal rate on his pump is 3.6 units/hr for 24H per day.  Ms. Orson Slick suggested we have pt reduce the basal rate on his pump by 20% to 2.88 units/hr.  I also relayed to Ms. Orson Slick that pt complaining about not having an appt to get his new Omni Pod Insulin pump set up.  Ms. Orson Slick gave me an appt for pt on Tuesday Oct 8th at 10am.  Will relay this info to pt and if he cannot make this appt than he can call Ms. Harmon to change the appt.  Sent message to MD with recommendations to reduce pt's basal rate on his pump to 2.88 units/hr.  MD gave mer permission to have pt reduce the basal rate to 2.88 units/hr.  Addendum 11am--Met w/ pt at bedside.  Pt told me he got a text message from [facility] with appt for pump training for next Tuesday Oct 8th.  Pt knows to call Monday and change appt if he will still be here in the hospital next Tuesday.  Discussed w/ pt that the CDE at the ENDO  office suggested he decrease the basal rate on his pump by 20% to 2.88 units/hr--Dr. Chipper Herb agrees with this and gave me orders to have pt reduce the basal rate on his pump.  Pt told me he doesn't know how to change the basal rate.  With pt's permission, I was able to reduce the basal rate on his pump to 2.9 units/hr (basal rate was not able to be the exact rate of 2.88 units/hr--It could only be 2.8 or 2.9 units/hr so we rounded up to the closest rate of 2.9 units/hr).  Pt watched me complete the entire task and we reviewed the new rate twice together--pt stated agreement of the new basal rate 2.9 units/hr.  RN made aware of the change and is still checking CBGs TID AC + HS + 2am with the hospital meter.      Diabetes Coordinator met with pt in June and July during Admissions and provided counseling Pt was supposed to switch from the Medtronic Insulin Pump to the [employer] Insulin Pump but unable to due to insurance coverage.  Basal insulin  12A  3.6 units/hour Total daily basal insulin: 86.4 units/24 hours  Carb Coverage 1:4       1 unit for every 4 grams of carbohydrates  Insulin Sensitivity 1:20     1 unit drops blood glucose 20 mg/dl  Target Glucose Goals 16X 100-110 mg/dl    --Will follow patient during hospitalization--  Ambrose Finland RN, MSN, CDCES Diabetes Coordinator Inpatient Glycemic Control Team Team Pager: [phone] (8a-5p)

## 2023-08-23 NOTE — Treatment Plan (Signed)
Diagnosis: MRSA bacteremia Osteomyelitis of the stump  Baseline Creatinine around 1.8  Culture Result: MRSA  Allergies  Allergen Reactions   Oxycodone Itching    OPAT Orders Discharge antibiotics: Daptomycin 800mg  IVPB every 24 hours For 6 weeks  End Date:Oct 01, 2023  While on Daptomycin closely monitor CK and watch for Trhabdomyolysis Eosnophilia, pneumonia Adjust Daptomycin frequency if crcl< 30  PIC Care Per Protocol:  Labs weekly while on IV antibiotics: _X_ CBC with differential X  CK _X_ CMP _X_ CRP _X_ ESR   _X_ Please pull PIC at completion of IV antibiotics  Fax weekly lab results  promptly to [phone]  Clinic Follow Up Appt: with Dr.Shantese Raven NOV 5, at 10.45 Am [address]  Medical arts building   Call [phone] with any questions or concerns

## 2023-08-24 DIAGNOSIS — M7551 Bursitis of right shoulder: Secondary | ICD-10-CM | POA: Diagnosis not present

## 2023-08-24 DIAGNOSIS — T8740 Infection of amputation stump, unspecified extremity: Secondary | ICD-10-CM | POA: Diagnosis not present

## 2023-08-24 DIAGNOSIS — N179 Acute kidney failure, unspecified: Secondary | ICD-10-CM | POA: Diagnosis not present

## 2023-08-24 DIAGNOSIS — B9562 Methicillin resistant Staphylococcus aureus infection as the cause of diseases classified elsewhere: Secondary | ICD-10-CM

## 2023-08-24 DIAGNOSIS — R7881 Bacteremia: Secondary | ICD-10-CM | POA: Diagnosis not present

## 2023-08-24 LAB — GLUCOSE, CAPILLARY
Glucose-Capillary: 118 mg/dL — ABNORMAL HIGH (ref 70–99)
Glucose-Capillary: 147 mg/dL — ABNORMAL HIGH (ref 70–99)
Glucose-Capillary: 210 mg/dL — ABNORMAL HIGH (ref 70–99)
Glucose-Capillary: 207 mg/dL — ABNORMAL HIGH (ref 70–99)
Glucose-Capillary: 181 mg/dL — ABNORMAL HIGH (ref 70–99)

## 2023-08-24 LAB — MAGNESIUM: Magnesium: 1.9 mg/dL (ref 1.7–2.4)

## 2023-08-24 LAB — BASIC METABOLIC PANEL
Anion gap: 8 (ref 5–15)
BUN: 28 mg/dL — ABNORMAL HIGH (ref 6–20)
CO2: 22 mmol/L (ref 22–32)
Calcium: 8.3 mg/dL — ABNORMAL LOW (ref 8.9–10.3)
Chloride: 103 mmol/L (ref 98–111)
Creatinine, Ser: 1.41 mg/dL — ABNORMAL HIGH (ref 0.61–1.24)
GFR, Estimated: >60 mL/min (ref >60)
Glucose, Bld: 131 mg/dL — ABNORMAL HIGH (ref 70–99)
Potassium: 3.5 mmol/L (ref 3.5–5.1)
Sodium: 133 mmol/L — ABNORMAL LOW (ref 135–145)

## 2023-08-24 MED ORDER — HYDROMORPHONE HCL 2 MG PO TABS
2.0000 mg | ORAL_TABLET | Freq: Four times a day (QID) | ORAL | Status: DC | PRN
  Administered 2023-08-24 – 2023-08-29 (×12): 2 mg via ORAL
  Filled 2023-08-24 (×14): qty 1

## 2023-08-24 MED ORDER — POTASSIUM CHLORIDE CRYS ER 20 MEQ PO TBCR
40.0000 meq | EXTENDED_RELEASE_TABLET | Freq: Once | ORAL | Status: AC
  Administered 2023-08-24: 40 meq via ORAL
  Filled 2023-08-24: qty 2

## 2023-08-24 NOTE — Progress Notes (Signed)
Progress Note   Patient: Richard Mendoza LOV:[id number] DOB: August 04, 1976 DOA: 08/20/2023     4 DOS: the patient was seen and examined on 08/24/2023   Brief hospital course: Richard Mendoza is a 47 y.o. male with medical history significant of uncontrolled type 2 diabetes, tobacco abuse, hypertension, hyperlipidemia, history of BKA presenting with diabetic stump infection.  Patient reports worsening drainage at the base of his stump over the past 1 to 2 weeks.  Patient is seen by vascular surgery, started on broad-spectrum antibiotics with cefepime, Flagyl and vancomycin.  MRI of the left lower extremity showed soft tissue abscess, tibial osteomyelitis, knee joint effusion. Blood culture 1 set positive for Staph aureus, epidermidis and strep agalactiae.  Patient is seen by ID, treated with vancomycin then changed to daptomycin.   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 Tibial osteomyelitis. Large left knee joint effusion, possible septic joint. MRSA bacteremia. Recurrent left stump infection with noted recent admission July of this year for similar issue Evaluated by vascular surgery with patient deferring conversion from BKA to AKA Blood culture 1 set positive for Staph aureus, epidermidis and strep agalactiae.  Wound culture grow Staph aureus and group B strep. Patient is seen by orthopedics, knee tap  showed increased WBC in the joint, but culture negative for 2 days. Followed by ID, antibiotic switched to daptomycin 10/3. Will continue current antibiotics, likely discharge early next week.     Acute kidney injury on chronic kidney disease stage IIIa. Hyponatremia. Metabolic acidosis. Hypokalemia. Hypophosphatemia. Potassium still 3.5, give another dose of potassium.  Magnesium normalized.  CO2 level normalized.     Hyperglycemia due to uncontrolled type 2 diabetes mellitus (HCC) Hypoglycemia. Glucose running low, decrease dose on insulin pump.   Tobacco abuse Advised to quit, continue nicotine patch.   Gastro-esophageal reflux disease without esophagitis PPI   Anemia of chronic disease Monitor CBC, B12 level normal.  Iron level low, ferritin elevated.  Added iron supplement.   Left shoulder bursitis. MRI shoulder showed bursitis, will be followed by orthopedics.            Subjective:  Patient doing well today, mild pain in left lower extremity.  Physical Exam: Vitals:   08/23/23 1612 08/23/23 1924 08/24/23 0306 08/24/23 0439  BP: (!) 176/92 (!) 177/93 (!) 158/85   Pulse: 74 82 75   Resp: 18 20 14    Temp: 98 F (36.7 C) 98.9 F (37.2 C) 98.6 F (37 C)   TempSrc:      SpO2: 99% 100% 99%   Weight:    89.5 kg  Height:       General exam: Appears calm and comfortable  Respiratory system: Clear to auscultation. Respiratory effort normal. Cardiovascular system: S1 & S2 heard, RRR. No JVD, murmurs, rubs, gallops or clicks. No pedal edema. Gastrointestinal system: Abdomen is nondistended, soft and nontender. No organomegaly or masses felt. Normal bowel sounds heard. Central  nervous system: Alert and oriented. No focal neurological deficits. Extremities: Left BKA. Skin: No rashes, lesions or ulcers Psychiatry: Judgement and insight appear normal. Mood & affect appropriate.    Data Reviewed:  Lab results reviewed.  Family Communication:  None  Disposition: Status is: Inpatient Remains inpatient appropriate because: Severity of disease, IV treatment.     Time spent: 35 minutes  Author: Marrion Coy, MD 08/24/2023 1:48 PM  For on call review [email or web address].

## 2023-08-25 DIAGNOSIS — M7551 Bursitis of right shoulder: Secondary | ICD-10-CM | POA: Diagnosis not present

## 2023-08-25 DIAGNOSIS — T8740 Infection of amputation stump, unspecified extremity: Secondary | ICD-10-CM | POA: Diagnosis not present

## 2023-08-25 DIAGNOSIS — N189 Chronic kidney disease, unspecified: Secondary | ICD-10-CM | POA: Diagnosis not present

## 2023-08-25 DIAGNOSIS — N179 Acute kidney failure, unspecified: Secondary | ICD-10-CM | POA: Diagnosis not present

## 2023-08-25 LAB — BASIC METABOLIC PANEL
Anion gap: 10 (ref 5–15)
BUN: 29 mg/dL — ABNORMAL HIGH (ref 6–20)
CO2: 22 mmol/L (ref 22–32)
Calcium: 8.2 mg/dL — ABNORMAL LOW (ref 8.9–10.3)
Chloride: 102 mmol/L (ref 98–111)
Creatinine, Ser: 1.43 mg/dL — ABNORMAL HIGH (ref 0.61–1.24)
GFR, Estimated: >60 mL/min (ref >60)
Glucose, Bld: 105 mg/dL — ABNORMAL HIGH (ref 70–99)
Potassium: 3.8 mmol/L (ref 3.5–5.1)
Sodium: 134 mmol/L — ABNORMAL LOW (ref 135–145)

## 2023-08-25 LAB — GLUCOSE, CAPILLARY
Glucose-Capillary: 104 mg/dL — ABNORMAL HIGH (ref 70–99)
Glucose-Capillary: 140 mg/dL — ABNORMAL HIGH (ref 70–99)
Glucose-Capillary: 195 mg/dL — ABNORMAL HIGH (ref 70–99)
Glucose-Capillary: 266 mg/dL — ABNORMAL HIGH (ref 70–99)
Glucose-Capillary: 115 mg/dL — ABNORMAL HIGH (ref 70–99)

## 2023-08-25 LAB — AEROBIC CULTURE W GRAM STAIN (SUPERFICIAL SPECIMEN)

## 2023-08-25 LAB — MISC LABCORP TEST (SEND OUT): Labcorp test code: 96388

## 2023-08-25 LAB — CBC
HCT: 26.5 % — ABNORMAL LOW (ref 39.0–52.0)
Hemoglobin: 9 g/dL — ABNORMAL LOW (ref 13.0–17.0)
MCH: 27 pg (ref 26.0–34.0)
MCHC: 34 g/dL (ref 30.0–36.0)
MCV: 79.6 fL — ABNORMAL LOW (ref 80.0–100.0)
Platelets: 307 10*3/uL (ref 150–400)
RBC: 3.33 MIL/uL — ABNORMAL LOW (ref 4.22–5.81)
RDW: 15.5 % (ref 11.5–15.5)
WBC: 10.8 10*3/uL — ABNORMAL HIGH (ref 4.0–10.5)
nRBC: 0 % (ref 0.0–0.2)

## 2023-08-25 LAB — BODY FLUID CULTURE W GRAM STAIN: Culture: NO GROWTH

## 2023-08-25 MED ORDER — ACETAMINOPHEN 325 MG PO TABS
650.0000 mg | ORAL_TABLET | Freq: Four times a day (QID) | ORAL | Status: DC | PRN
  Administered 2023-08-25 – 2023-08-26 (×2): 650 mg via ORAL
  Filled 2023-08-25 (×2): qty 2

## 2023-08-25 NOTE — Plan of Care (Signed)
  Problem: Coping: Goal: Ability to adjust to condition or change in health will improve Outcome: Progressing   Problem: Safety: Goal: Ability to remain free from injury will improve Outcome: Progressing   Problem: Coping: Goal: Level of anxiety will decrease Outcome: Not Progressing

## 2023-08-26 ENCOUNTER — Other Ambulatory Visit: Payer: Self-pay

## 2023-08-26 DIAGNOSIS — M7551 Bursitis of right shoulder: Secondary | ICD-10-CM | POA: Diagnosis not present

## 2023-08-26 DIAGNOSIS — T8740 Infection of amputation stump, unspecified extremity: Secondary | ICD-10-CM | POA: Diagnosis not present

## 2023-08-26 DIAGNOSIS — B9562 Methicillin resistant Staphylococcus aureus infection as the cause of diseases classified elsewhere: Secondary | ICD-10-CM | POA: Diagnosis not present

## 2023-08-26 DIAGNOSIS — R7881 Bacteremia: Secondary | ICD-10-CM | POA: Diagnosis not present

## 2023-08-26 LAB — CULTURE, BLOOD (ROUTINE X 2)
Culture: NO GROWTH
Culture: NO GROWTH
Special Requests: ADEQUATE
Special Requests: ADEQUATE

## 2023-08-26 LAB — SYNOVIAL CELL COUNT + DIFF, W/ CRYSTALS
Crystals, Fluid: NONE SEEN
Eosinophils-Synovial: 0 %
Lymphocytes-Synovial Fld: 1 %
Monocyte-Macrophage-Synovial Fluid: 4 %
Neutrophil, Synovial: 95 %
WBC, Synovial: 55944 /mm3 — ABNORMAL HIGH (ref 0–200)

## 2023-08-26 LAB — GLUCOSE, CAPILLARY
Glucose-Capillary: 131 mg/dL — ABNORMAL HIGH (ref 70–99)
Glucose-Capillary: 52 mg/dL — ABNORMAL LOW (ref 70–99)
Glucose-Capillary: 159 mg/dL — ABNORMAL HIGH (ref 70–99)
Glucose-Capillary: 201 mg/dL — ABNORMAL HIGH (ref 70–99)
Glucose-Capillary: 177 mg/dL — ABNORMAL HIGH (ref 70–99)
Glucose-Capillary: 204 mg/dL — ABNORMAL HIGH (ref 70–99)

## 2023-08-26 LAB — MIC RESULT

## 2023-08-26 MED ORDER — ATORVASTATIN CALCIUM 40 MG PO TABS: 40.0000 mg | ORAL_TABLET | Freq: Every day | ORAL | 0 refills | Status: DC

## 2023-08-26 MED ORDER — SODIUM CHLORIDE 0.9% FLUSH
10.0000 mL | Freq: Two times a day (BID) | INTRAVENOUS | Status: DC
  Administered 2023-08-26 – 2023-08-30 (×9): 10 mL

## 2023-08-26 MED ORDER — HYDROMORPHONE HCL 2 MG PO TABS: 2.0000 mg | ORAL_TABLET | Freq: Three times a day (TID) | ORAL | 0 refills | Status: DC

## 2023-08-26 MED ORDER — POLYSACCHARIDE IRON COMPLEX 150 MG PO CAPS: 150.0000 mg | ORAL_CAPSULE | Freq: Every day | ORAL | 0 refills | Status: DC

## 2023-08-26 MED ORDER — NICOTINE 14 MG/24HR TD PT24: 14.0000 mg | MEDICATED_PATCH | Freq: Every day | TRANSDERMAL | 0 refills | Status: DC

## 2023-08-26 MED ORDER — DAPTOMYCIN IV (FOR PTA / DISCHARGE USE ONLY): 800.0000 mg | INTRAVENOUS | 0 refills | Status: DC

## 2023-08-26 MED ORDER — SODIUM CHLORIDE 0.9% FLUSH: 10.0000 mL | INTRAVENOUS | Status: DC | PRN

## 2023-08-26 NOTE — TOC Progression Note (Signed)
Transition of Care [facility]) - Progression Note    Patient Details  Name: Numa Schroeter MRN: [id number] Date of Birth: 1976/03/21  [facility]) CM/SW Contact  Chapman Fitch, RN Phone Number: 08/26/2023, 9:59 AM  Clinical Narrative:     Elita Quick with [employer] to provide bedside teaching today for home infusion.  Per Pam they are still awaiting insurance verification.  MD aware    Expected Discharge Plan: Home/Self Care Barriers to Discharge: Continued Medical Work up  Expected Discharge Plan and Services     Post Acute Care Choice: NA Living arrangements for the past 2 months: Single Family Home                                       Social Determinants of Health (SDOH) Interventions SDOH Screenings   Food Insecurity: No Food Insecurity (06/01/2023)  Housing: High Risk (06/01/2023)  Transportation Needs: No Transportation Needs (06/01/2023)  Utilities: Not At Risk (06/01/2023)  Depression (PHQ2-9): Low Risk  (09/21/2019)  Financial Resource Strain: Low Risk  (07/24/2022)   Received from [facility], [facility]  Social Connections: Unknown (03/21/2022)   Received from [facility], [facility]  Stress: No Stress Concern Present (07/24/2022)   Received from [facility], [facility]  Tobacco Use: High Risk (06/01/2023)    Readmission Risk Interventions    08/21/2023    9:03 AM  Readmission Risk Prevention Plan  Transportation Screening Complete  PCP or Specialist Appt within 3-5 Days Complete  Social Work Consult for Recovery Care Planning/Counseling Complete  Palliative Care Screening Not Applicable  Medication Review Oceanographer) Complete

## 2023-08-26 NOTE — Progress Notes (Signed)
Progress Note   Patient: Richard Mendoza LKG:[id number] DOB: 21-Feb-1976 DOA: 08/20/2023     6 DOS: the patient was seen and examined on 08/25/2023   Brief hospital course: Richard Mendoza is a 47 y.o. male with medical history significant of uncontrolled type 2 diabetes, tobacco abuse, hypertension, hyperlipidemia, history of BKA presenting with diabetic stump infection.  Patient reports worsening drainage at the base of his stump over the past 1 to 2 weeks.  Patient is seen by vascular surgery, started on broad-spectrum antibiotics with cefepime, Flagyl and vancomycin.  MRI of the left lower extremity showed soft tissue abscess, tibial osteomyelitis, knee joint effusion. Blood culture 1 set positive for Staph aureus, epidermidis and strep agalactiae.  Patient is seen by ID, treated with vancomycin then changed to daptomycin.   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 Tibial osteomyelitis. Large left knee joint effusion, possible septic joint. MRSA bacteremia. Recurrent left stump infection with noted recent admission July of this year for similar issue Evaluated by vascular surgery with patient deferring conversion from BKA to AKA Blood culture 1 set positive for Staph aureus, epidermidis and strep agalactiae.  Wound culture grow Staph aureus and group B strep. Patient is seen by orthopedics, knee tap  showed increased WBC in the joint, but culture negative for 2 days. Followed by ID, antibiotic switched to daptomycin 10/3. Patient condition is stable, most likely will place a PICC line tomorrow for long-term antibiotics.     Acute kidney injury on chronic kidney disease stage IIIa. Hyponatremia. Metabolic acidosis. Hypokalemia. Hypophosphatemia. Potassium still 3.5, give another dose of potassium.  Magnesium normalized.  CO2 level normalized.     Hyperglycemia due to uncontrolled type 2 diabetes mellitus (HCC) Hypoglycemia. Glucose running low, decrease dose on insulin pump.   Tobacco abuse Advised to quit, continue nicotine patch.   Gastro-esophageal reflux disease without esophagitis PPI   Anemia of chronic disease Monitor CBC, B12 level normal.  Iron level low, ferritin elevated.  Added iron supplement.   Left shoulder bursitis. MRI shoulder showed bursitis, will be followed by orthopedics.     Subjective:  Patient doing well, has normal looking bowel movement.  Physical Exam: Vitals:   08/25/23 1532 08/25/23 2020 08/26/23 0436 08/26/23 0744  BP: (!) 153/69 (!) 171/82 (!) 150/80 (!) 162/81  Pulse: 76 75 75 86  Resp: 18 18 18 18   Temp: 98.2 F (36.8 C) 98.2 F (36.8 C) 98.2 F (36.8 C) 98 F (36.7 C)  TempSrc: Oral     SpO2: 96% 98% 97% 98%  Weight:      Height:       General exam: Appears calm and comfortable  Respiratory system: Clear to auscultation. Respiratory effort normal. Cardiovascular system: S1 & S2 heard, RRR. No JVD, murmurs, rubs, gallops or clicks. No pedal edema. Gastrointestinal system: Abdomen is nondistended, soft and nontender. No organomegaly or masses felt. Normal bowel sounds heard.  Central nervous system: Alert and oriented x3. No focal neurological deficits. Extremities: Left BKA. Skin: No rashes, lesions or ulcers Psychiatry: Judgement and insight appear normal. Mood & affect appropriate.    Data Reviewed:  Lab results  reviewed.  Family Communication: None  Disposition: Status is: Inpatient Remains inpatient appropriate because: Severity of disease, IV treatment.     Time spent: 23 minutes  Author: Marrion Coy, MD   For on call review [email or web address].

## 2023-08-26 NOTE — Plan of Care (Signed)
  Problem: Nutritional: Goal: Maintenance of adequate nutrition will improve Outcome: Progressing Goal: Maintenance of adequate weight for body size and type will improve Outcome: Progressing   Problem: Clinical Measurements: Goal: Ability to maintain clinical measurements within normal limits will improve Outcome: Progressing Goal: Will remain free from infection Outcome: Progressing Goal: Diagnostic test results will improve Outcome: Progressing Goal: Respiratory complications will improve Outcome: Progressing Goal: Cardiovascular complication will be avoided Outcome: Progressing   Problem: Pain Managment: Goal: General experience of comfort will improve Outcome: Progressing   Problem: Safety: Goal: Ability to remain free from injury will improve Outcome: Progressing

## 2023-08-26 NOTE — TOC Progression Note (Addendum)
Transition of Care [facility]) - Progression Note    Patient Details  Name: Richard Mendoza MRN: [id number] Date of Birth: [birth year]  Transition [facility] Care [facility]) CM/SW Contact  Chapman Fitch, RN Phone Number: 08/26/2023, 2:37 PM  Clinical Narrative:     Per Elita Quick with [employer] education completed and insurance verification complete. MD notified.  Infusion will me managed by [employer] RN.  Home medications will be delivered to the house today   Expected Discharge Plan: Home/Self Care Barriers to Discharge: Continued Medical Work up  Expected Discharge Plan and Services     Post Acute Care Choice: NA Living arrangements for the past 2 months: Single Family Home                                       Social Determinants of Health (SDOH) Interventions SDOH Screenings   Food Insecurity: No Food Insecurity (06/01/2023)  Housing: High Risk (06/01/2023)  Transportation Needs: No Transportation Needs (06/01/2023)  Utilities: Not At Risk (06/01/2023)  Depression (PHQ2-9): Low Risk  (09/21/2019)  Financial Resource Strain: Low Risk  (07/24/2022)   Received [facility], [facility]  Social Connections: Unknown (03/21/2022)   Received [facility], [facility]  Stress: No Stress Concern Present (07/24/2022)   Received from [facility], [facility]  Tobacco Use: High Risk (06/01/2023)    Readmission Risk Interventions    08/21/2023    9:03 AM  Readmission Risk Prevention Plan  Transportation Screening Complete  PCP or Specialist Appt within 3-5 Days Complete  Social Work Consult for Recovery Care Planning/Counseling Complete  Palliative Care Screening Not Applicable  Medication Review Oceanographer) Complete

## 2023-08-26 NOTE — Progress Notes (Signed)
Date of Admission:  08/20/2023     ID: Richard Mendoza is a 47 y.o. 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)   Tobacco abuse   Overweight (BMI 25.0-29.9)   MRSA bacteremia   Iron deficiency anemia, unspecified   Acute bursitis of right shoulder    Subjective: Pt c/o severe pain rt shoulder Not able to move the arm  Medications:   vitamin C  500 mg Oral BID   atorvastatin  40 mg Oral Daily   Chlorhexidine Gluconate Cloth  6 each Topical Daily   enoxaparin (LOVENOX) injection  40 mg Subcutaneous Q24H   insulin pump   Subcutaneous TID WC, HS, 0200   iron polysaccharides  150 mg Oral Daily   lidocaine  1 patch Transdermal Q24H   multivitamin with minerals  1 tablet Oral Daily   nicotine  14 mg Transdermal Daily   Ensure Max Protein  11 oz Oral BID   sodium chloride flush  10-40 mL Intracatheter Q12H   zinc sulfate  220 mg Oral Daily    Objective: Vital signs in last 24 hours: Patient Vitals for the past 24 hrs:  BP Temp Temp src Pulse Resp SpO2  08/26/23 0744 (!) 162/81 98 F (36.7 C) -- 86 18 98 %  08/26/23 0436 (!) 150/80 98.2 F (36.8 C) -- 75 18 97 %  08/25/23 2020 (!) 171/82 98.2 F (36.8 C) -- 75 18 98 %  08/25/23 1532 (!) 153/69 98.2 F (36.8 C) Oral 76 18 96 %       PHYSICAL EXAM:  General: In some distress Lungs: Clear to auscultation bilaterally. No Wheezing or Rhonchi. No rales. Heart: Regular rate and rhythm, no murmur, rub or gallop. Abdomen: did not examine Extremities: rt shoulder painfully restricted mobility Left BKA Skin: No rashes or lesions. Or bruising Lymph: Cervical,  supraclavicular normal. Neurologic: Grossly non-focal  Lab Results    Latest Ref Rng & Units 08/25/2023    3:34 AM 08/23/2023    4:31 AM 08/22/2023    3:16 AM  CBC  WBC 4.0 - 10.5 K/uL 10.8  8.0  9.5   Hemoglobin 13.0 - 17.0 g/dL 9.0  8.1  8.7   Hematocrit 39.0 - 52.0 % 26.5  24.2  24.3   Platelets 150 - 400 K/uL 307  244  253        Latest Ref Rng & Units 08/25/2023    3:34 AM 08/24/2023    4:09 AM 08/23/2023    4:31 AM  CMP  Glucose 70 - 99 mg/dL 956  213  74   BUN 6 - 20 mg/dL 29  28  23    Creatinine 0.61 - 1.24 mg/dL 0.86  5.78  4.69   Sodium 135 - 145 mmol/L 134  133  134   Potassium 3.5 - 5.1 mmol/L 3.8  3.5  3.5   Chloride 98 - 111 mmol/L 102  103  103   CO2 22 - 32 mmol/L 22  22  21    Calcium 8.9 - 10.3 mg/dL 8.2  8.3  8.2       Microbiology: BC - MSSA WC -  MSSA Studies/Results: [place] EKG SITE RITE  Result Date: 08/26/2023 If Site Rite image not attached, placement could not be confirmed due to current cardiac rhythm.    Assessment/Plan: ?staph aureus bacteremia BCID mentioned MC A gene But culture ID MSSA Asked lab to reconfirm Currently on Daptomycin IF MSSA is confirmed can change to cefazolin  Source left BKA site infection- MSSA- but different susceptibility On Daptomycin   Left BKA stump infection with osteomyelitis - seen by vascular- currently no plans for surgical intervention He will need 6 weeks of IV antibiotic -   Aspiration of left knee ( 2113 wbc) Culture NG   Anemia   DM   HTN   AKI on CKDimproving Hypokalemia improving  Rt should pain and restricted mobility- r/o septic arthritis bursitis, partial tear of tendon Followed by ortho Fluid aspirated > 50K wbc-  Pt will be taken for wash out    Discussed the management with patient and hospitalist

## 2023-08-26 NOTE — Discharge Summary (Addendum)
Physician Discharge Summary   Patient: Richard Mendoza MRN: [id number] DOB: [birth year]  Admit date:     08/20/2023  Discharge date: 08/26/23  Discharge Physician: Marrion Coy   PCP: Barbette Reichmann, MD   Recommendations at discharge:   Follow-up with PCP in 1 week. Follow-up with infect disease in 2 or 3 weeks. Follow-up with vascular surgery in 1 to 2 weeks. Follow up with orthopedics in 1 to 2 weeks.  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 *  Hospital Course: Richard Mendoza is a 47 y.o. male with medical history significant of uncontrolled type 2 diabetes, tobacco abuse, hypertension, hyperlipidemia, history of BKA presenting with diabetic stump infection.  Patient reports worsening drainage at the base of his stump over the past 1 to 2 weeks.  Patient is seen by vascular surgery, started on broad-spectrum antibiotics with cefepime, Flagyl and vancomycin.  MRI of the left lower extremity showed soft tissue abscess, tibial osteomyelitis, knee joint effusion. Blood culture 1 set positive for Staph aureus, epidermidis and strep agalactiae.  Patient is seen by ID, treated with vancomycin then changed to daptomycin.  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 Tibial osteomyelitis. Large left knee joint effusion, possible septic joint. MRSA bacteremia. Recurrent left  stump infection with noted recent admission July of this year for similar issue Evaluated by vascular surgery with patient deferring conversion from BKA to AKA Blood culture 1 set positive for Staph aureus, epidermidis and strep agalactiae.  Wound culture grow Staph aureus and group B strep. Patient is seen by orthopedics, knee tap showed increased WBC in the joint, but culture negative for 2 days. Followed by ID, antibiotic switched to daptomycin 10/3. Patient condition is stable, discussed with ID, patient be treated with daptomycin for 6 weeks.  Currently he is medically stable for discharge.     Acute kidney injury on chronic kidney disease stage IIIa. Hyponatremia. Metabolic acidosis. Hypokalemia. Hypophosphatemia. Condition are improved, creatinine dropped down to 1.43 with GFR more than 60.       Hyperglycemia due to uncontrolled type 2 diabetes mellitus (HCC) Hypoglycemia. Follow-up with PCP and endocrinology for insulin pump.  Dose has decreased yesterday.   Tobacco abuse Advised to quit, continue nicotine patch.   Gastro-esophageal reflux disease without esophagitis PPI   Anemia of chronic disease Monitor CBC, B12 level normal.  Iron level low, ferritin elevated.  Added iron supplement.   Left shoulder bursitis. MRI shoulder showed bursitis, will be followed by orthopedic as outpatient.    Discussed with Dr. Allena Katz, discharge canceled for now, Right shoulder tap was performed.  Will await for final culture results before discharge to make sure does not have any infection.     Consultants: ID, orthopedics, vascular surgery. Procedures performed: None  Disposition: Home health Diet recommendation:  Carb modified diet DISCHARGE MEDICATION: Allergies as of 08/26/2023       Reactions   Oxycodone  Itching        Medication List     STOP taking these medications    benazepril 20 MG tablet Commonly known as: LOTENSIN   HYDROcodone-acetaminophen 10-325 MG  tablet Commonly known as: NORCO   linezolid 600 MG tablet Commonly known as: ZYVOX       TAKE these medications    atorvastatin 40 MG tablet Commonly known as: LIPITOR Take 1 tablet (40 mg total) by mouth daily.   [facility] Senna 8.6 MG tablet Generic drug: senna Take 1 tablet by mouth 2 (two) times daily as needed for constipation.   daptomycin IVPB Commonly known as: CUBICIN Inject 800 mg into the vein daily. Indication: MRSA bacteremia/osteomyelitis of stump First Dose: Yes Last Day of Therapy: 10/01/2023 While on Daptomycin closely monitor CK and watch for rhabdomyolysis, eosinophilia, pneumonia Adjust Daptomycin frequency if crcl< 30 Labs - Once weekly:  CBC/D, BMP, and CPK Labs - Once weekly: ESR and CRP Fax weekly lab results  promptly to [phone] Please pull PIC at completion of IV antibiotics Method of administration: IV Push Method of administration may be changed at the discretion of home infusion pharmacist based upon assessment of the patient and/or caregiver's ability to self-administer the medication ordered.   Fiasp 100 UNIT/ML Soln Generic drug: Insulin Aspart (w/Niacinamide) Inject into the skin continuous. Via pump   HYDROmorphone 2 MG tablet Commonly known as: DILAUDID Take 1 tablet (2 mg total) by mouth every 8 (eight) hours.   iron polysaccharides 150 MG capsule Commonly known as: NIFEREX Take 1 capsule (150 mg total) by mouth daily. Start taking on: August 27, 2023   nicotine 14 mg/24hr patch Commonly known as: NICODERM CQ - dosed in mg/24 hours Place 1 patch (14 mg total) onto the skin daily. Start taking on: August 27, 2023               Discharge Care Instructions  (From admission, onward)           Start     Ordered   08/26/23 0000  Change dressing on IV access line weekly and PRN  (Home infusion instructions - [facility] )        08/26/23 1454            Follow-up Information     Barbette Reichmann, MD  Follow up in 1 week(s).   Specialty: Internal Medicine Contact information: [address] [place] [address] [phone]         Lynn Ito, MD Follow up in 3 week(s).   Specialty: Infectious Diseases Contact information: [address] [place] [address] [phone]         Annice Needy, MD Follow up in 1 week(s).   Specialties: Vascular Surgery, Radiology, Interventional Cardiology Contact information: [address] [place] [address] [phone]         Reinaldo Berber, MD Follow up in 1 week(s).   Specialty: Orthopedic Surgery Contact information: [address] [place] [address] [phone]                Discharge Exam: Ceasar Mons Weights   08/23/23 0300 08/24/23 0439 08/25/23 0500  Weight: 92.5 kg 89.5 kg 87.7 kg   General exam: Appears calm and comfortable  Respiratory system: Clear to auscultation. Respiratory effort normal. Cardiovascular system: S1 & S2 heard, RRR. No JVD, murmurs, rubs, gallops or clicks. No pedal edema. Gastrointestinal system: Abdomen is nondistended, soft and nontender.  No organomegaly or masses felt. Normal bowel sounds heard. Central nervous system: Alert and oriented. No focal neurological deficits. Extremities: Left AKA. Skin: No rashes, lesions or ulcers Psychiatry: Judgement and insight appear normal. Mood & affect appropriate.    Condition at discharge: good  The results of significant diagnostics from this hospitalization (including imaging, microbiology, ancillary and laboratory) are listed below for reference.   Imaging Studies: [place] EKG SITE RITE  Result Date: 08/26/2023 If Site Rite image not attached, placement could not be confirmed due to current cardiac rhythm.  MR SHOULDER RIGHT WO CONTRAST  Result Date: 08/23/2023 CLINICAL DATA:  Sharp pain in the right shoulder EXAM: MRI OF THE RIGHT SHOULDER WITHOUT CONTRAST  TECHNIQUE: Multiplanar, multisequence MR imaging of the shoulder was performed. No intravenous contrast was administered. COMPARISON:  Report from 01/28/2019, and radiographs 08/21/2023 FINDINGS: Rotator cuff: Prominent distal anterior supraspinatus tendinopathy with partial thickness articular surface tearing extending towards but not definitely reaching the bursal surface. Moderate to prominent subscapularis and mild infraspinatus tendinopathy. Muscles: Abnormal intramuscular edema anteriorly and laterally in the deltoid muscle, and along the periphery of the posterior deltoid. Edema tracks along the scapular margin of the infraspinatus. Biceps long head: Moderate tendinopathy of the intra-articular segment. Acromioclavicular Joint: Moderate inferior spurring of the [facility] joint compatible with moderate degenerative arthropathy. Type II acromion. Extensive fluid in the subacromial subdeltoid bursa compatible with bursitis. Glenohumeral Joint: Substantial joint effusion. Mild degenerative chondral thinning. Fluid and potentially synovitis in the rotator interval. Labrum:  Grossly intact. Bones: No significant extra-articular osseous abnormalities identified. Other: No supplemental non-categorized findings. IMPRESSION: 1. Prominent distal anterior supraspinatus tendinopathy with partial thickness articular surface tearing extending towards but not definitely reaching the bursal surface. 2. Moderate to prominent subscapularis and mild infraspinatus tendinopathy. 3. Moderate tendinopathy of the intra-articular segment of the long head of the biceps. 4. Moderate degenerative arthropathy of the [facility] joint. 5. Extensive fluid in the subacromial subdeltoid bursa compatible with bursitis. 6. Substantial joint effusion with fluid and potentially synovitis in the rotator interval. 7. Abnormal intramuscular edema anteriorly and laterally in the deltoid muscle, and along the periphery of the posterior deltoid. Appearance favors  muscle strain/tear. Electronically Signed   By: Gaylyn Rong M.D.   On: 08/23/2023 10:08   DG Shoulder Right  Result Date: 08/21/2023 CLINICAL DATA:  Right shoulder pain.  Fall. EXAM: RIGHT SHOULDER - 2+ VIEW COMPARISON:  None Available. FINDINGS: No acute fracture or dislocation is identified. Mild-to-moderate glenohumeral joint space narrowing and marginal spurring are noted. The acromioclavicular joint is unremarkable. The soft tissues are unremarkable. IMPRESSION: 1. No acute osseous abnormality. 2. Glenohumeral osteoarthrosis. Electronically Signed   By: Sebastian Ache M.D.   On: 08/21/2023 13:28   MR KNEE LEFT W WO CONTRAST  Result Date: 08/20/2023 CLINICAL DATA:  Below the knee amputation in 2021 with increasing drainage at the left knee stump over the past 1-2 weeks. Uncontrolled diabetes. EXAM: MRI OF THE LEFT KNEE WITHOUT AND WITH CONTRAST TECHNIQUE: Multiplanar, multisequence MR imaging of the left knee was performed both before and after administration of intravenous contrast. CONTRAST:  7.54mL GADAVIST GADOBUTROL 1 MMOL/ML IV SOLN COMPARISON:  Radiographs 08/20/2023 and 06/01/2023. MRI 06/01/2023 and 04/30/2023. FINDINGS: Bones/Joint/Cartilage Status post below the knee amputation. Compared with the previous MRI, there is significantly increasing T2 hyperintensity and heterogeneous enhancement throughout the remaining tibia, highly suspicious for osteomyelitis. Probable mild cortical destruction along the lateral aspect of the distal stump. The remaining fibula appears intact. No abnormality of the  distal femur or patella identified. A moderate-sized knee joint effusion has enlarged from the prior study and is associated with mild diffuse synovial enhancement. There are no associated erosive changes. Ligaments The cruciate and collateral ligaments are intact. The menisci are intact. Muscles and Tendons Interval increased edema and heterogeneous enhancement throughout the muscles of the lower  leg. The visualized quadriceps and patellar tendons are intact. Soft tissues New peripherally enhancing fluid collection extending inferiorly from the tibial stump, measuring up to 1.5 x 1.1 cm transverse and 2.6 cm in length. This extends nearly to the skin surface and is consistent with a small soft tissue abscess. There is skin irregularity along the distal aspect of the stump. No other focal fluid collections are identified. Scattered postsurgical susceptibility artifact within the distal soft tissues. IMPRESSION: 1. New small soft tissue abscess extending inferiorly from the tibial stump. 2. Significant interval increase in T2 hyperintensity and enhancement throughout the remaining tibia, highly suspicious for osteomyelitis. 3. Enlarging knee joint effusion with mild synovial enhancement, nonspecific. Cannot exclude early septic arthritis. No evidence of osteomyelitis within the distal femur, patella or proximal fibula. Electronically Signed   By: Carey Bullocks M.D.   On: 08/20/2023 16:47   DG Knee 1-2 Views Left  Result Date: 08/20/2023 CLINICAL DATA:  Wound infection EXAM: LEFT KNEE - 2 VIEW COMPARISON:  06/01/2023 FINDINGS: Postop below-the-knee amputation. Erosion of the cortex of the lateral stump of the tibia suggests osteomyelitis and represents an interval change. Superior and inferior patellar spurs. No knee joint effusion. IMPRESSION: Postop changes status post BKA. Lateral cortical disc continuity of the tibia stump suggests the presence of osteomyelitis. Electronically Signed   By: Layla Maw M.D.   On: 08/20/2023 10:32    Microbiology: Results for orders placed or performed during the hospital encounter of 08/20/23  Blood culture (routine x 2)     Status: Abnormal (Preliminary result)   Collection Time: 08/20/23  9:45 AM   Specimen: BLOOD LEFT ARM  Result Value Ref Range Status   Specimen Description   Final    BLOOD LEFT ARM Performed at [facility], [address] [place], [place] [address]    Special Requests   Final    BOTTLES DRAWN AEROBIC AND ANAEROBIC Blood Culture adequate volume Performed at [facility], [address] [place], [place] [address]    Culture  Setup Time   Final    GRAM POSITIVE COCCI IN BOTH AEROBIC AND ANAEROBIC BOTTLES CRITICAL VALUE NOTED.  VALUE IS CONSISTENT WITH PREVIOUSLY REPORTED AND CALLED VALUE. Performed at [facility], [address] [place], [place] [address]    Culture (A)  Final    STAPHYLOCOCCUS AUREUS Sent to [facility] for further susceptibility testing. Performed at [facility], [address] [place], [place] [address]    Report Status PENDING  Incomplete   Organism ID, Bacteria STAPHYLOCOCCUS AUREUS  Final      Susceptibility   Staphylococcus aureus - MIC*    CIPROFLOXACIN <=0.5 SENSITIVE Sensitive     ERYTHROMYCIN >=8 RESISTANT Resistant     GENTAMICIN <=0.5 SENSITIVE Sensitive     OXACILLIN 0.5 SENSITIVE Sensitive     TETRACYCLINE >=16 RESISTANT Resistant     VANCOMYCIN 1 SENSITIVE Sensitive     TRIMETH/SULFA <=10 SENSITIVE Sensitive     CLINDAMYCIN <=0.25 SENSITIVE Sensitive     RIFAMPIN <=0.5 SENSITIVE Sensitive     Inducible Clindamycin NEGATIVE Sensitive     LINEZOLID 2 SENSITIVE Sensitive     *  STAPHYLOCOCCUS AUREUS  Blood culture (routine x 2)     Status: Abnormal   Collection Time: 08/20/23  9:45 AM   Specimen: BLOOD LEFT ARM  Result Value Ref Range Status   Specimen Description   Final    BLOOD LEFT ARM Performed at [facility], [address], [place], [place] [address]    Special Requests   Final    BOTTLES DRAWN AEROBIC AND ANAEROBIC Blood Culture adequate volume Performed at [facility], [address] [place], [place] [address]    Culture  Setup Time   Final    GRAM POSITIVE COCCI IN BOTH AEROBIC AND ANAEROBIC BOTTLES CRITICAL RESULT CALLED TO, READ BACK BY AND VERIFIED WITH: NATHAN BELUE PHARMD @0208  08/21/23 ASW     Culture (A)  Final    STAPHYLOCOCCUS AUREUS SUSCEPTIBILITIES PERFORMED ON PREVIOUS CULTURE WITHIN THE LAST 5 DAYS. GROUP B STREP(S.AGALACTIAE)ISOLATED    Report Status 08/24/2023 FINAL  Final   Organism ID, Bacteria GROUP B STREP(S.AGALACTIAE)ISOLATED  Final      Susceptibility   Group b strep(s.agalactiae)isolated - MIC*    CLINDAMYCIN >=1 RESISTANT Resistant     AMPICILLIN <=0.25 SENSITIVE Sensitive     VANCOMYCIN 0.5 SENSITIVE Sensitive     CEFTRIAXONE <=0.12 SENSITIVE Sensitive     PENICILLIN <=0.06 SENSITIVE Sensitive     * GROUP B STREP(S.AGALACTIAE)ISOLATED  Blood Culture ID Panel (Reflexed)     Status: Abnormal   Collection Time: 08/20/23  9:45 AM  Result Value Ref Range Status   Enterococcus faecalis NOT DETECTED NOT DETECTED Final   Enterococcus Faecium NOT DETECTED NOT DETECTED Final   Listeria monocytogenes NOT DETECTED NOT DETECTED Final   Staphylococcus species DETECTED (A) NOT DETECTED Final    Comment: CRITICAL RESULT CALLED TO, READ BACK BY AND VERIFIED WITH: NATHAN BELUE PHARMD @0208  08/21/23 ASW    Staphylococcus aureus (BCID) DETECTED (A) NOT DETECTED Final    Comment: Methicillin (oxacillin)-resistant Staphylococcus aureus (MRSA). MRSA is predictably resistant to beta-lactam antibiotics (except ceftaroline). Preferred therapy is vancomycin unless clinically contraindicated. Patient requires contact precautions if  hospitalized. CRITICAL RESULT CALLED TO, READ BACK BY AND VERIFIED WITH: NATHAN BELUE PHARMD @0208  08/21/23 ASW    Staphylococcus epidermidis NOT DETECTED NOT DETECTED Final   Staphylococcus lugdunensis NOT DETECTED NOT DETECTED Final   Streptococcus species DETECTED (A) NOT DETECTED Final    Comment: CRITICAL RESULT CALLED TO, READ BACK BY AND VERIFIED WITH: NATHAN BELUE PHARMD @0208  08/21/23 ASW    Streptococcus agalactiae DETECTED (A) NOT DETECTED Final    Comment: CRITICAL RESULT CALLED TO, READ BACK BY AND VERIFIED WITH:   Streptococcus  pneumoniae NOT DETECTED NOT DETECTED Final    Comment: NATHAN BELUE PHARMD @0208  08/21/23 ASW   Streptococcus pyogenes NOT DETECTED NOT DETECTED Final   A.calcoaceticus-baumannii NOT DETECTED NOT DETECTED Final   Bacteroides fragilis NOT DETECTED NOT DETECTED Final   Enterobacterales NOT DETECTED NOT DETECTED Final   Enterobacter cloacae complex NOT DETECTED NOT DETECTED Final   Escherichia coli NOT DETECTED NOT DETECTED Final   Klebsiella aerogenes NOT DETECTED NOT DETECTED Final   Klebsiella oxytoca NOT DETECTED NOT DETECTED Final   Klebsiella pneumoniae NOT DETECTED NOT DETECTED Final   Proteus species NOT DETECTED NOT DETECTED Final   Salmonella species NOT DETECTED NOT DETECTED Final   Serratia marcescens NOT DETECTED NOT DETECTED Final   Haemophilus influenzae NOT DETECTED NOT DETECTED Final   Neisseria meningitidis NOT DETECTED NOT DETECTED Final   Pseudomonas aeruginosa NOT DETECTED NOT DETECTED  Final   Stenotrophomonas maltophilia NOT DETECTED NOT DETECTED Final   Candida albicans NOT DETECTED NOT DETECTED Final   Candida auris NOT DETECTED NOT DETECTED Final   Candida glabrata NOT DETECTED NOT DETECTED Final   Candida krusei NOT DETECTED NOT DETECTED Final   Candida parapsilosis NOT DETECTED NOT DETECTED Final   Candida tropicalis NOT DETECTED NOT DETECTED Final   Cryptococcus neoformans/gattii NOT DETECTED NOT DETECTED Final   Meth resistant mecA/C and MREJ DETECTED (A) NOT DETECTED Final    Comment: CRITICAL RESULT CALLED TO, READ BACK BY AND VERIFIED WITH: NATHAN BELUE PHARMD @0208  08/21/23 ASW Performed at [facility], [address] [place], [place] [address]   MRSA Next Gen by PCR, Nasal     Status: Abnormal   Collection Time: 08/20/23 12:26 PM   Specimen: Nasal Mucosa; Nasal Swab  Result Value Ref Range Status   MRSA by PCR Next Gen DETECTED (A) NOT DETECTED Final    Comment: RESULT CALLED TO, READ BACK BY AND VERIFIED WITH: KATIE LUMOS 08/20/23 1407  KLW (NOTE) The GeneXpert MRSA Assay (FDA approved for NASAL specimens only), is one component of a comprehensive MRSA colonization surveillance program. It is not intended to diagnose MRSA infection nor to guide or monitor treatment for MRSA infections. Test performance is not FDA approved in patients less than 62 years old. Performed at [facility], [address] [place], [place] [address]   Culture, blood (Routine X 2) w Reflex to ID Panel     Status: None (Preliminary result)   Collection Time: 08/21/23  8:10 AM   Specimen: BLOOD  Result Value Ref Range Status   Specimen Description BLOOD BLOOD LEFT HAND  Final   Special Requests   Final    BOTTLES DRAWN AEROBIC AND ANAEROBIC Blood Culture adequate volume   Culture   Final    NO GROWTH 4 DAYS Performed at [facility], [address], [place], [place] [address]    Report Status PENDING  Incomplete  Culture, blood (Routine X 2) w Reflex to ID Panel     Status: None (Preliminary result)   Collection Time: 08/21/23  8:18 AM   Specimen: BLOOD  Result Value Ref Range Status   Specimen Description BLOOD BLOOD RIGHT HAND  Final   Special Requests   Final    BOTTLES DRAWN AEROBIC ONLY Blood Culture adequate volume   Culture   Final    NO GROWTH 4 DAYS Performed at [facility], [address] [place], [place] [address]    Report Status PENDING  Incomplete  Aerobic Culture w Gram Stain (superficial specimen)     Status: None   Collection Time: 08/21/23  8:38 PM   Specimen: Wound  Result Value Ref Range Status   Specimen Description   Final    WOUND LEFT LEG STUMP Performed at [facility], [address], [place], [place] [address]    Special Requests   Final    NONE Performed at [facility], [address] [place], [place] [address]    Gram Stain   Final    RARE WBC PRESENT, PREDOMINANTLY PMN RARE GRAM POSITIVE COCCI    Culture   Final    FEW STAPHYLOCOCCUS  AUREUS FEW GROUP B STREP(S.AGALACTIAE)ISOLATED TESTING AGAINST S. AGALACTIAE NOT ROUTINELY PERFORMED DUE TO PREDICTABILITY OF AMP/PEN/VAN SUSCEPTIBILITY. Performed at [facility], [address], [place], [place] [address]    Report Status 08/25/2023 FINAL  Final  Organism ID, Bacteria STAPHYLOCOCCUS AUREUS  Final      Susceptibility   Staphylococcus aureus - MIC*    CIPROFLOXACIN <=0.5 SENSITIVE Sensitive     ERYTHROMYCIN <=0.25 SENSITIVE Sensitive     GENTAMICIN <=0.5 SENSITIVE Sensitive     OXACILLIN 0.5 SENSITIVE Sensitive     TETRACYCLINE <=1 SENSITIVE Sensitive     VANCOMYCIN 1 SENSITIVE Sensitive     TRIMETH/SULFA <=10 SENSITIVE Sensitive     CLINDAMYCIN <=0.25 SENSITIVE Sensitive     RIFAMPIN <=0.5 SENSITIVE Sensitive     Inducible Clindamycin NEGATIVE Sensitive     LINEZOLID 2 SENSITIVE Sensitive     * FEW STAPHYLOCOCCUS AUREUS  Minimum Inhibitory Conc. (1 Drug)     Status: None (Preliminary result)   Collection Time: 08/22/23 10:04 AM  Result Value Ref Range Status   Min Inhibitory Conc (1 Drug) Preliminary report  Final    Comment: (NOTE) Performed At: [facility] [address] [place], [place] [id number] Jolene Schimke MD OE:[id number]    Source CRE PENDING  Incomplete  Body fluid culture w Gram Stain     Status: None   Collection Time: 08/22/23  2:00 PM   Specimen: Synovium; Body Fluid  Result Value Ref Range Status   Specimen Description   Final    SYNOVIAL Performed at [facility] [place], [address], [place], [place] [address]    Special Requests   Final     LEFT KNEE SYNOVIAL Performed at [facility], [address] [place], [place] [address]    Gram Stain   Final    FEW WBC PRESENT, PREDOMINANTLY PMN NO ORGANISMS SEEN    Culture   Final    NO GROWTH 3 DAYS Performed at [facility], [address] [place], [place] [address]    Report Status 08/25/2023 FINAL  Final    Labs: CBC: Recent Labs  Lab  08/20/23 0902 08/21/23 0303 08/22/23 0316 08/23/23 0431 08/25/23 0334  WBC 14.4* 8.5 9.5 8.0 10.8*  NEUTROABS 13.7*  --   --   --   --   HGB 10.7* 9.2* 8.7* 8.1* 9.0*  HCT 32.3* 26.9* 24.3* 24.2* 26.5*  MCV 81.6 80.5 76.2* 79.9* 79.6*  PLT 316 248 253 244 307   Basic Metabolic Panel: Recent Labs  Lab 08/20/23 0902 08/20/23 1505 08/21/23 0303 08/21/23 0723 08/22/23 0316 08/22/23 1445 08/23/23 0431 08/24/23 0409 08/25/23 0334  NA 125*   < > 132* 129* 131*  --  134* 133* 134*  K 2.3*   < > 3.4* 3.2* 2.7* 3.3* 3.5 3.5 3.8  CL 94*   < > 107 107 105  --  103 103 102  CO2 19*   < > 19* 17* 20*  --  21* 22 22  GLUCOSE 513*   < > 147* 144* 67*  --  74 131* 105*  BUN 32*   < > 33* 30* 26*  --  23* 28* 29*  CREATININE 2.89*   < > 2.26* 2.08* 1.60*  --  1.40* 1.41* 1.43*  CALCIUM 8.3*   < > 8.1* 7.9* 8.0*  --  8.2* 8.3* 8.2*  MG 2.3  --  2.2  --  2.0  --  2.0 1.9  --   PHOS  --   --  1.9*  --  2.1*  --  2.5  --   --    < > = values in this interval not displayed.   Liver Function Tests: Recent  Labs  Lab 08/20/23 0902 08/21/23 0303  AST 15 34  ALT 12 18  ALKPHOS 105 84  BILITOT 0.6 0.4  PROT 7.5 6.6  ALBUMIN 2.6* 2.2*   CBG: Recent Labs  Lab 08/25/23 2153 08/26/23 0434 08/26/23 0745 08/26/23 0904 08/26/23 1135  GLUCAP 115* 131* 52* 159* 201*    Discharge time spent: greater than 30 minutes.  Signed: Marrion Coy, MD [facility] 08/26/2023

## 2023-08-26 NOTE — Progress Notes (Addendum)
There was initial plan for discharge for this patient today.  However, he is continuing to have quite severe right shoulder pain.  Initially when I examined him on 08/22/2023, he had described an incident where he was putting his prosthesis on and having a fall afterwards with his shoulder hurting much more after that incident.  Given the traumatic history and his significant weakness to rotator cuff testing, there was initial concern for a acute rotator cuff tear.  Since that time, an MRI was obtained.  Results as below:   IMPRESSION: 1. Prominent distal anterior supraspinatus tendinopathy with partial thickness articular surface tearing extending towards but not definitely reaching the bursal surface. 2. Moderate to prominent subscapularis and mild infraspinatus tendinopathy. 3. Moderate tendinopathy of the intra-articular segment of the long head of the biceps. 4. Moderate degenerative arthropathy of the [place] joint. 5. Extensive fluid in the subacromial subdeltoid bursa compatible with bursitis. 6. Substantial joint effusion with fluid and potentially synovitis in the rotator interval. 7. Abnormal intramuscular edema anteriorly and laterally in the deltoid muscle, and along the periphery of the posterior deltoid. Appearance favors muscle strain/tear.  Patient was reexamined in the setting of these findings of relatively normal rotator cuff tear with significant fluid about the shoulder and joint effusion.  Exam: RUE: +ain/pin/u motor SILT r/u/m/ax +rad pulse RoM shoulder: Very minimal active range of motion, can reach approximately 50 degrees forward flexion passively, 30 degrees external rotation passively before severe pain. There is significant weakness with strength testing as well, although this appears to be related to his pain.   A/P: Given all of these findings, there is concern that this may be a septic right shoulder, especially in the setting of MRSA bacteremia. 1.  We  discussed the patient's symptoms and his imaging findings.  This traumatic mechanism, MRI did not suggest rotator cuff pathology.  However there was a significant amount of fluid in the subacromial space and glenohumeral joint, which could be consistent with a septic joint, especially in the setting of MRSA bacteria.  After discussion of risk, benefits, and alternatives, we agreed to proceed with an aspiration of the right shoulder joint.  Please see procedure note below for further details.  2.  Further plan based on aspiration results.  I did discuss with the patient that if there is concern for septic joint, I would likely recommend right shoulder arthroscopic irrigation and debridement.  We can plan to perform this tomorrow given that the patient is clinically stable.  Patient was in agreement with this plan after we discussed the risk, benefits, and alternatives to surgery if this was the case.    Procedure Note - Right Shoulder Aspiration I reviewed with the patient the procedure of L shoulder joint aspiration and discussed the risks, benefits, and alternative treatments. We discussed the potential risks of infection, increased pain, and incomplete relief or temporary relief of symptoms. Verbal consent was obtained.  A time-out was conducted to verify correct patient identity, procedure to be performed, and correct side and site. The skin was marked and then prepped in usual sterile fashion.  Using a posterior approach, I was able to aspirate approximately 5 cc of cloudy, yellow, synovial fluid from the glenohumeral joint.  Aspiration sent for cell count, culture, and crystals.  Patient tolerated procedure well.    ADDENDUM (08/27/23): Aspirate results show 55K WBCs, 95% PMNs, no crystals. This along with patient's exam is consistent with septic arthritis of the shoulder.   Plan for R shoulder arthroscopic  I&D on 08/27/23. Keep NPO until OR. Hold anticoagulation.   After discussion of risks,  benefits, and alternatives to surgery, the patient elected to proceed in this fashion.

## 2023-08-26 NOTE — Treatment Plan (Signed)
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 Baseline Creatinine 1.43    Allergies  Allergen Reactions   Oxycodone Itching    OPAT Orders Discharge antibiotics: Daptomycin 800mg  IV every 24 hours Duration: 6 weeks End Date: 10/01/23  P.S- the daptomycin may be  changed to another antibiotic like cefazolin once the  culture susceptibility of Staph aureus is back from [facility] - will let infusion complany know once the result is available  [facility] Care Per Protocol:  Labs weekly while on IV antibiotics: _X_ CBC with differential _X_ CK _X_ CMP _X_ CRP _X_ ESR   _X_ Please pull PIC at completion of IV antibiotics   Fax weekly lab results  promptly to [phone]  Clinic Follow Up Appt:09/24/23 at 10.45 AM with Dr.Silvia Hightower   Call [phone] with any  questions or critical values

## 2023-08-26 NOTE — Progress Notes (Signed)
Peripherally Inserted Central Catheter Placement  The IV Nurse has discussed with the patient and/or persons authorized to consent for the patient, the purpose of this procedure and the potential benefits and risks involved with this procedure.  The benefits include less needle sticks, lab draws from the catheter, and the patient may be discharged home with the catheter. Risks include, but not limited to, infection, bleeding, blood clot (thrombus formation), and puncture of an artery; nerve damage and irregular heartbeat and possibility to perform a PICC exchange if needed/ordered by physician.  Alternatives to this procedure were also discussed.  Bard Power PICC patient education guide, fact sheet on infection prevention and patient information card has been provided to patient /or left at bedside.    PICC Placement Documentation  PICC Single Lumen 08/26/23 Left Basilic 44 cm 0 cm (Active)  Indication for Insertion or Continuance of Line Home intravenous therapies (PICC only) 08/26/23 1259  Exposed Catheter (cm) 0 cm 08/26/23 1259  Site Assessment Clean, Dry, Intact 08/26/23 1259  Line Status Flushed;Saline locked;Blood return noted 08/26/23 1259  Dressing Type Transparent 08/26/23 1259  Dressing Status Antimicrobial disc in place;Clean, Dry, Intact 08/26/23 1259  Line Care Connections checked and tightened 08/26/23 1259  Line Adjustment (NICU/IV Team Only) No 08/26/23 1259  Dressing Intervention New dressing;Adhesive placed at insertion site (IV team only);Other (Comment) 08/26/23 1259  Dressing Change Due 09/02/23 08/26/23 1259       Annett Fabian 08/26/2023, 1:01 PM

## 2023-08-26 NOTE — Progress Notes (Signed)
Hypoglycemic Event  CBG: 52  Treatment: 4 oz juice/soda  Symptoms: None  Follow-up CBG: Time:0906 CBG Result:159  Possible Reasons for Event: Inadequate meal intake  Comments/MD notified patient ate breakfast as well    Richard Mendoza

## 2023-08-27 ENCOUNTER — Inpatient Hospital Stay: Payer: Medicaid Other | Admitting: Anesthesiology

## 2023-08-27 ENCOUNTER — Other Ambulatory Visit: Payer: Self-pay

## 2023-08-27 ENCOUNTER — Encounter: Payer: Self-pay | Admitting: Family Medicine

## 2023-08-27 ENCOUNTER — Encounter
Admission: EM | Disposition: A | Discharge status: Home Health | Payer: Self-pay | Source: Home / Self Care | Attending: Internal Medicine

## 2023-08-27 ENCOUNTER — Inpatient Hospital Stay
Admit: 2023-08-27 | Discharge: 2023-08-27 | Disposition: A | Discharge status: Home / Self Care | Payer: Medicaid Other | Attending: Internal Medicine

## 2023-08-27 ENCOUNTER — Inpatient Hospital Stay: Payer: Medicaid Other

## 2023-08-27 DIAGNOSIS — T8740 Infection of amputation stump, unspecified extremity: Secondary | ICD-10-CM | POA: Diagnosis not present

## 2023-08-27 DIAGNOSIS — R7881 Bacteremia: Secondary | ICD-10-CM | POA: Diagnosis not present

## 2023-08-27 DIAGNOSIS — N179 Acute kidney failure, unspecified: Secondary | ICD-10-CM | POA: Diagnosis not present

## 2023-08-27 DIAGNOSIS — M7551 Bursitis of right shoulder: Secondary | ICD-10-CM | POA: Diagnosis not present

## 2023-08-27 HISTORY — PX: SHOULDER ARTHROSCOPY WITH DEBRIDEMENT AND BICEP TENDON REPAIR: SHX5690

## 2023-08-27 LAB — GLUCOSE, CAPILLARY
Glucose-Capillary: 179 mg/dL — ABNORMAL HIGH (ref 70–99)
Glucose-Capillary: 146 mg/dL — ABNORMAL HIGH (ref 70–99)
Glucose-Capillary: 218 mg/dL — ABNORMAL HIGH (ref 70–99)
Glucose-Capillary: 457 mg/dL — ABNORMAL HIGH (ref 70–99)

## 2023-08-27 SURGERY — SHOULDER ARTHROSCOPY WITH DEBRIDEMENT AND BICEP TENDON REPAIR
Anesthesia: General | Site: Shoulder | Laterality: Right

## 2023-08-27 MED ORDER — HYDROMORPHONE HCL 1 MG/ML IJ SOLN
1.0000 mg | INTRAMUSCULAR | Status: DC | PRN
  Administered 2023-08-27 – 2023-08-28 (×4): 1 mg via INTRAVENOUS
  Filled 2023-08-27 (×4): qty 1

## 2023-08-27 MED ORDER — HYDROCODONE-ACETAMINOPHEN 10-325 MG PO TABS
1.0000 | ORAL_TABLET | Freq: Four times a day (QID) | ORAL | Status: DC | PRN
  Administered 2023-08-27 – 2023-08-29 (×3): 1 via ORAL
  Filled 2023-08-27 (×3): qty 1

## 2023-08-27 MED ORDER — KETAMINE HCL 10 MG/ML IJ SOLN
INTRAMUSCULAR | Status: DC | PRN
Start: 2023-08-27 — End: 2023-08-27
  Administered 2023-08-27: 20 mg via INTRAVENOUS
  Administered 2023-08-27: 30 mg via INTRAVENOUS

## 2023-08-27 MED ORDER — BUPIVACAINE HCL (PF) 0.5 % IJ SOLN
INTRAMUSCULAR | Status: AC
  Filled 2023-08-27: qty 30

## 2023-08-27 MED ORDER — LIDOCAINE HCL (CARDIAC) PF 100 MG/5ML IV SOSY
PREFILLED_SYRINGE | INTRAVENOUS | Status: DC | PRN
  Administered 2023-08-27: 80 mg via INTRAVENOUS

## 2023-08-27 MED ORDER — GLYCOPYRROLATE 0.2 MG/ML IJ SOLN
INTRAMUSCULAR | Status: DC | PRN
  Administered 2023-08-27: .2 mg via INTRAVENOUS

## 2023-08-27 MED ORDER — DEXAMETHASONE SODIUM PHOSPHATE 10 MG/ML IJ SOLN
INTRAMUSCULAR | Status: DC | PRN
  Administered 2023-08-27: 5 mg via INTRAVENOUS

## 2023-08-27 MED ORDER — BUPIVACAINE HCL (PF) 0.5 % IJ SOLN
INTRAMUSCULAR | Status: AC
  Filled 2023-08-27: qty 10

## 2023-08-27 MED ORDER — ACETAMINOPHEN 500 MG PO TABS
1000.0000 mg | ORAL_TABLET | Freq: Three times a day (TID) | ORAL | Status: DC
  Administered 2023-08-27 – 2023-08-30 (×8): 1000 mg via ORAL
  Filled 2023-08-27 (×9): qty 2

## 2023-08-27 MED ORDER — KETAMINE HCL 50 MG/5ML IJ SOSY
PREFILLED_SYRINGE | INTRAMUSCULAR | Status: AC
  Filled 2023-08-27: qty 5

## 2023-08-27 MED ORDER — PROPOFOL 10 MG/ML IV BOLUS
INTRAVENOUS | Status: AC
  Filled 2023-08-27: qty 20

## 2023-08-27 MED ORDER — SUGAMMADEX SODIUM 200 MG/2ML IV SOLN
INTRAVENOUS | Status: DC | PRN
  Administered 2023-08-27: 200 mg via INTRAVENOUS

## 2023-08-27 MED ORDER — DAPTOMYCIN IV (FOR PTA / DISCHARGE USE ONLY): 800.0000 mg | INTRAVENOUS | 0 refills | Status: AC

## 2023-08-27 MED ORDER — FENTANYL CITRATE (PF) 100 MCG/2ML IJ SOLN
INTRAMUSCULAR | Status: DC | PRN
  Administered 2023-08-27: 75 ug via INTRAVENOUS
  Administered 2023-08-27: 25 ug via INTRAVENOUS
  Administered 2023-08-27 (×2): 50 ug via INTRAVENOUS

## 2023-08-27 MED ORDER — LACTATED RINGERS IV SOLN: INTRAVENOUS | Status: DC | PRN

## 2023-08-27 MED ORDER — BUPIVACAINE LIPOSOME 1.3 % IJ SUSP
INTRAMUSCULAR | Status: AC
  Filled 2023-08-27: qty 10

## 2023-08-27 MED ORDER — NAPROXEN 500 MG PO TABS
500.0000 mg | ORAL_TABLET | Freq: Two times a day (BID) | ORAL | Status: DC
  Administered 2023-08-27: 500 mg via ORAL
  Filled 2023-08-27 (×2): qty 1

## 2023-08-27 MED ORDER — LIDOCAINE HCL (PF) 1 % IJ SOLN
INTRAMUSCULAR | Status: AC
  Filled 2023-08-27: qty 5

## 2023-08-27 MED ORDER — RINGERS IRRIGATION IR SOLN
Status: DC | PRN
  Administered 2023-08-27 (×2): 3000 mL

## 2023-08-27 MED ORDER — LIDOCAINE HCL (PF) 1 % IJ SOLN
INTRAMUSCULAR | Status: DC | PRN
Start: 2023-08-27 — End: 2023-08-27
  Administered 2023-08-27: 3 mL

## 2023-08-27 MED ORDER — FENTANYL CITRATE (PF) 100 MCG/2ML IJ SOLN
INTRAMUSCULAR | Status: AC
  Filled 2023-08-27: qty 2

## 2023-08-27 MED ORDER — MIDAZOLAM HCL 2 MG/2ML IJ SOLN
INTRAMUSCULAR | Status: AC
  Filled 2023-08-27: qty 2

## 2023-08-27 MED ORDER — NEOMYCIN-POLYMYXIN B GU 40-200000 IR SOLN
Status: DC | PRN
  Administered 2023-08-27: 6022 mL

## 2023-08-27 MED ORDER — BUPIVACAINE LIPOSOME 1.3 % IJ SUSP
INTRAMUSCULAR | Status: DC | PRN
Start: 2023-08-27 — End: 2023-08-27
  Administered 2023-08-27: 10 mL

## 2023-08-27 MED ORDER — ASPIRIN 325 MG PO TBEC
325.0000 mg | DELAYED_RELEASE_TABLET | Freq: Every day | ORAL | Status: DC
  Filled 2023-08-27: qty 1

## 2023-08-27 MED ORDER — BUPIVACAINE HCL (PF) 0.5 % IJ SOLN
INTRAMUSCULAR | Status: DC | PRN
Start: 2023-08-27 — End: 2023-08-27
  Administered 2023-08-27: 10 mL

## 2023-08-27 MED ORDER — MIDAZOLAM HCL 2 MG/2ML IJ SOLN
2.0000 mg | Freq: Once | INTRAMUSCULAR | Status: AC
  Administered 2023-08-27: 2 mg via INTRAVENOUS

## 2023-08-27 MED ORDER — TRIAMCINOLONE ACETONIDE 40 MG/ML IJ SUSP
INTRAMUSCULAR | Status: AC
  Filled 2023-08-27: qty 1

## 2023-08-27 MED ORDER — ROCURONIUM BROMIDE 100 MG/10ML IV SOLN
INTRAVENOUS | Status: DC | PRN
  Administered 2023-08-27: 50 mg via INTRAVENOUS

## 2023-08-27 MED ORDER — PROPOFOL 10 MG/ML IV BOLUS
INTRAVENOUS | Status: DC | PRN
  Administered 2023-08-27: 160 mg via INTRAVENOUS

## 2023-08-27 MED ORDER — ENOXAPARIN SODIUM 40 MG/0.4ML IJ SOSY
40.0000 mg | PREFILLED_SYRINGE | INTRAMUSCULAR | Status: DC
  Administered 2023-08-28 – 2023-08-30 (×3): 40 mg via SUBCUTANEOUS
  Filled 2023-08-27 (×3): qty 0.4

## 2023-08-27 SURGICAL SUPPLY — 35 items
ADPR IRR PORT MULTIBAG TUBE (MISCELLANEOUS)
APL PRP STRL LF DISP 70% ISPRP (MISCELLANEOUS) ×1
BLADE SHAVER 4.5X7 STR FR (MISCELLANEOUS) ×1 IMPLANT
CANNULA PART THRD DISP 5.75X7 (CANNULA) ×1 IMPLANT
CHLORAPREP W/TINT 26 (MISCELLANEOUS) ×1 IMPLANT
COOLER POLAR GLACIER W/PUMP (MISCELLANEOUS) ×1 IMPLANT
DRAPE U-SHAPE 47X51 STRL (DRAPES) ×2 IMPLANT
DRSG TEGADERM 4X4.75 (GAUZE/BANDAGES/DRESSINGS) ×2 IMPLANT
ELECT REM PT RETURN 9FT ADLT (ELECTROSURGICAL) ×1
ELECTRODE REM PT RTRN 9FT ADLT (ELECTROSURGICAL) ×1 IMPLANT
EVACUATOR 1/8 PVC DRAIN (DRAIN) IMPLANT
GAUZE SPONGE 4X4 12PLY STRL (GAUZE/BANDAGES/DRESSINGS) ×1 IMPLANT
GAUZE XEROFORM 1X8 LF (GAUZE/BANDAGES/DRESSINGS) ×1 IMPLANT
GLOVE BIO SURGEON STRL SZ8 (GLOVE) ×1 IMPLANT
GLOVE BIOGEL PI IND STRL 7.5 (GLOVE) ×1 IMPLANT
GOWN STRL REUS W/ TWL LRG LVL3 (GOWN DISPOSABLE) ×1 IMPLANT
GOWN STRL REUS W/ TWL XL LVL3 (GOWN DISPOSABLE) ×1 IMPLANT
GOWN STRL REUS W/TWL LRG LVL3 (GOWN DISPOSABLE) ×1
GOWN STRL REUS W/TWL XL LVL3 (GOWN DISPOSABLE) ×1
IV LACTATED RINGER IRRG 3000ML (IV SOLUTION) ×4
IV LR IRRIG 3000ML ARTHROMATIC (IV SOLUTION) ×6 IMPLANT
KIT STABILIZATION SHOULDER (MISCELLANEOUS) ×1 IMPLANT
KIT TURNOVER KIT A (KITS) ×1 IMPLANT
MANIFOLD NEPTUNE II (INSTRUMENTS) ×2 IMPLANT
MASK FACE SPIDER DISP (MASK) ×1 IMPLANT
MAT ABSORB FLUID 56X50 GRAY (MISCELLANEOUS) ×2 IMPLANT
PACK ARTHROSCOPY SHOULDER (MISCELLANEOUS) ×1 IMPLANT
PAD WRAPON POLAR SHDR XLG (MISCELLANEOUS) ×1 IMPLANT
SET Y ADAPTER MULIT-BAG IRRIG (MISCELLANEOUS) ×1 IMPLANT
SLING ARM LRG DEEP (SOFTGOODS) IMPLANT
SYR 5ML LL (SYRINGE) ×1 IMPLANT
TUBE SET DOUBLEFLO OUTFLOW (TUBING) ×1 IMPLANT
TUBING INFLOW SET DBFLO PUMP (TUBING) ×1 IMPLANT
WAND WEREWOLF FLOW 90D (MISCELLANEOUS) ×1 IMPLANT
WRAPON POLAR PAD SHDR XLG (MISCELLANEOUS) ×1

## 2023-08-27 NOTE — H&P (Signed)
H&P reviewed. Patient developed R shoulder septic arthritis. See note from yesterday for further details.

## 2023-08-27 NOTE — Transfer of Care (Signed)
Immediate Anesthesia Transfer of Care Note  Patient: Richard Mendoza  Procedure(s) Performed: SHOULDER ARTHROSCOPY WITH INCISION AND DRAINAGE (Right: Shoulder)  Patient Location: PACU  Anesthesia Type:General  Level of Consciousness: awake, alert , and oriented  Airway & Oxygen Therapy: Patient Spontanous Breathing and Patient connected to face mask oxygen  Post-op Assessment: Report given to RN and Post -op Vital signs reviewed and stable  Post vital signs: stable  Last Vitals:  Vitals Value Taken Time  BP 114/69 08/27/23 1406  Temp    Pulse 73 08/27/23 1413  Resp 21 08/27/23 1413  SpO2 99 % 08/27/23 1413  Vitals shown include unfiled device data.  Last Pain:  Vitals:   08/27/23 1111  TempSrc: Temporal  PainSc: 7       Patients Stated Pain Goal: 0 (08/27/23 1111)  Complications: No notable events documented.

## 2023-08-27 NOTE — TOC Progression Note (Signed)
Transition of Care [facility]) - Progression Note    Patient Details  Name: Richard Mendoza MRN: [id number] Date of Birth: 1976/01/06  Transition of [facility]) CM/SW Contact  Chapman Fitch, RN Phone Number: 08/27/2023, 3:22 PM  Clinical Narrative:     Per MD patient anticipated to be in the hospital 2 more days.  Pam with [employer] notified   Expected Discharge Plan: Home/Self Care Barriers to Discharge: Continued Medical Work up  Expected Discharge Plan and Services     Post Acute Care Choice: NA Living arrangements for the past 2 months: Single Family Home                                       Social Determinants of Health (SDOH) Interventions SDOH Screenings   Food Insecurity: No Food Insecurity (06/01/2023)  Housing: High Risk (06/01/2023)  Transportation Needs: No Transportation Needs (06/01/2023)  Utilities: Not At Risk (06/01/2023)  Depression (PHQ2-9): Low Risk  (09/21/2019)  Financial Resource Strain: Low Risk  (07/24/2022)   Received from [facility], [facility]  Social Connections: Unknown (03/21/2022)   Received from [facility], [facility]  Stress: No Stress Concern Present (07/24/2022)   Received from [facility], [facility]  Tobacco Use: High Risk (08/27/2023)    Readmission Risk Interventions    08/21/2023    9:03 AM  Readmission Risk Prevention Plan  Transportation Screening Complete  PCP or Specialist Appt within 3-5 Days Complete  Social Work Consult for Recovery Care Planning/Counseling Complete  Palliative Care Screening Not Applicable  Medication Review Oceanographer) Complete

## 2023-08-27 NOTE — Anesthesia Procedure Notes (Signed)
Anesthesia Regional Block: Interscalene brachial plexus block   Pre-Anesthetic Checklist: , timeout performed,  Correct Patient, Correct Site, Correct Laterality,  Correct Procedure, Correct Position, site marked,  Risks and benefits discussed,  Surgical consent,  Pre-op evaluation,  At surgeon's request and post-op pain management  Laterality: Right  Prep: alcohol swabs       Needles:  Injection technique: Single-shot  Needle Type: Echogenic Needle     Needle Length: 4cm  Needle Gauge: 25     Additional Needles:   Procedures:,,,, ultrasound used (permanent image in chart),,    Narrative:  Start time: 08/27/2023 11:47 AM End time: 08/27/2023 11:49 AM Injection made incrementally with aspirations every 5 mL.  Performed by: Personally  Anesthesiologist: Louie Boston, MD  Additional Notes: Patient's chart reviewed and they were deemed appropriate candidate for procedure, at surgeon's request. Patient educated about risks, benefits, and alternatives of the block including but not limited to: temporary or permanent nerve damage, bleeding, infection, damage to surround tissues, pneumothorax, hemidiaphragmatic paralysis, unilateral Horner's syndrome, block failure, local anesthetic toxicity. Patient expressed understanding. A formal time-out was conducted consistent with institution rules.  Monitors were applied, and minimal sedation used (see nursing record). The site was prepped with skin prep and allowed to dry, and sterile gloves were used. A high frequency linear ultrasound probe with probe cover was utilized throughout. C5-7 nerve roots located and appeared anatomically normal, local anesthetic injected around them, and echogenic block needle trajectory was monitored throughout. Aspiration performed every 5ml. Lung and blood vessels were avoided. All injections were performed without resistance and free of blood and paresthesias. The patient tolerated the procedure  well.  Injectate: 10ml exparel + 10ml 0.5% bupivacaine

## 2023-08-27 NOTE — Progress Notes (Signed)
Tech arrived to administer Echocardiogram. Patient just returned from surgery. Patient asked if Tech could return later. Per Tech can come back tomorrow.   Madie Reno, RN

## 2023-08-27 NOTE — Progress Notes (Signed)
Nutrition Follow Up Note   DOCUMENTATION CODES:   Not applicable  INTERVENTION:   Ensure Max protein supplement po BID, each supplement provides 150kcal and 30g of protein.  MVI po daily   Vitamin C 500mg  po BID   Zinc 220mg  po daily x 30 days  NUTRITION DIAGNOSIS:   Increased nutrient needs related to wound healing as evidenced by estimated needs.  GOAL:   Patient will meet greater than or equal to 90% of their needs -progressing   MONITOR:   PO intake, Supplement acceptance, Labs, Weight trends, I & O's, Skin  ASSESSMENT:   47 y/o male with h/o CKD, DM, gastroparesis, GERD, HLD, OSA, HTN, chronic pain, MDD, PAD and diabetic foot ulcer s/p L BKA (2021) who is admitted with stump infection, MRSA bacteremia and AKI complicated by R shoulder septic arthritis s/p R arthroscopic irrigation and debridement and arthroscopic complete synovectomy 10/8.  Met with pt in room today. Pt reports decreased appetite and oral intake for several days pta and for the first few days of admission. Pt reports that he is eating 100% of meals now. Pt had just returned from surgery today and reports that he is ready to eat. Pt reports drinking a few of the Ensure supplements but reports that he does not drink most of them. RD discussed with pt the importance of adequate nutrition needed to preserve lean muscle and to support wound healing. RD also discussed diabetes with pt and how this affects wound healing. Pt reports that he is willing to drink the Ensure now that he knows what they are for (prefers strawberry and chocolate). Recommended for pt to continue supplements and vitamins until wound healing is complete. Recommended diabetes diet and controlling blood sugars to support healing. Per chart, pt appears weight stable since admission.      Medications reviewed and include: vitamin C, aspirin, lovenox, insulin, iron, nicotine, MVI, zinc, daptomycin   Labs reviewed: Na 134(L), K 3.8 wnl, BUN 29(H),  creat 1.43(H)- 10/6 Wbc- 10.8(H), Hgb 9.0(L), Hct 26.5(L)- 10/6 Cbgs- 218, 146, 179 x 24 hrs   NUTRITION - FOCUSED PHYSICAL EXAM:  Flowsheet Row Most Recent Value  Orbital Region No depletion  Upper Arm Region Mild depletion  Thoracic and Lumbar Region No depletion  Buccal Region No depletion  Temple Region No depletion  Clavicle Bone Region Mild depletion  Clavicle and Acromion Bone Region Mild depletion  Scapular Bone Region No depletion  Dorsal Hand No depletion  Patellar Region Mild depletion  Anterior Thigh Region Mild depletion  Posterior Calf Region Mild depletion  Edema (RD Assessment) None  Hair Reviewed  Eyes Reviewed  Mouth Reviewed  Skin Reviewed  Nails Reviewed   Diet Order:   Diet Order             Diet Carb Modified Fluid consistency: Thin; Room service appropriate? Yes  Diet effective now                  EDUCATION NEEDS:   Not appropriate for education at this time  Skin:  Skin Assessment: Reviewed RN Assessment (incision R shoulder, infected stump s/p L BKA)  Last BM:  9/30  Height:   Ht Readings from Last 1 Encounters:  08/27/23 5\' 10"  (1.778 m)    Weight:   Wt Readings from Last 1 Encounters:  08/27/23 87.7 kg    Ideal Body Weight:  75.45 kg  BMI:  Body mass index is 27.74 kg/m.  Estimated Nutritional Needs:   Kcal:  2100-2400kcal/day  Protein:  105-120g/day  Fluid:  2.3-2.6L/day  Betsey Holiday MS, RD, LDN Please refer to [facility] for RD and/or RD on-call/weekend/after hours pager

## 2023-08-27 NOTE — Plan of Care (Signed)
  Problem: Coping: Goal: Ability to adjust to condition or change in health will improve Outcome: Progressing   Problem: Skin Integrity: Goal: Risk for impaired skin integrity will decrease Outcome: Progressing   

## 2023-08-27 NOTE — Progress Notes (Signed)
ID Pt had rt shoulder arthroscopy and debridement Cultures sent  He is back from OR Comfortable Rt arm in sling BP (!) 184/99   Pulse 82   Temp 98 F (36.7 C) (Oral)   Resp 20   Ht 5\' 10"  (1.778 m)   Wt 87.7 kg   SpO2 96%   BMI 27.74 kg/m   Rt arm surgical dressing Chest b/l air entry [id number] Left BKA   Labs    Latest Ref Rng & Units 08/25/2023    3:34 AM 08/23/2023    4:31 AM 08/22/2023    3:16 AM  CBC  WBC 4.0 - 10.5 K/uL 10.8  8.0  9.5   Hemoglobin 13.0 - 17.0 g/dL 9.0  8.1  8.7   Hematocrit 39.0 - 52.0 % 26.5  24.2  24.3   Platelets 150 - 400 K/uL 307  244  253        Latest Ref Rng & Units 08/25/2023    3:34 AM 08/24/2023    4:09 AM 08/23/2023    4:31 AM  CMP  Glucose 70 - 99 mg/dL 161  096  74   BUN 6 - 20 mg/dL 29  28  23    Creatinine 0.61 - 1.24 mg/dL 0.45  4.09  8.11   Sodium 135 - 145 mmol/L 134  133  134   Potassium 3.5 - 5.1 mmol/L 3.8  3.5  3.5   Chloride 98 - 111 mmol/L 102  103  103   CO2 22 - 32 mmol/L 22  22  21    Calcium 8.9 - 10.3 mg/dL 8.2  8.3  8.2      Impression/recommendation staph aureus bacteremia BCID mentioned MEC A gene But culture ID MSSA Asked lab to reconfirm Currently on Daptomycin IF MSSA is confirmed can change to cefazolin   Source left BKA site infection- MSSA- but different susceptibility On Daptomycin   Left BKA stump infection with osteomyelitis - seen by vascular- currently no plans for surgical intervention He will need 6 weeks of IV antibiotic -    Aspiration of left knee ( 2113 wbc) Culture NG   Anemia   DM   HTN   AKI on CKDimproving Hypokalemia improving   Rt should pain and restricted mobility-  septic arthritis likely  Underwent washout and culture sent  Discussed the management with patient and Dr.patel

## 2023-08-27 NOTE — Anesthesia Postprocedure Evaluation (Signed)
Anesthesia Post Note  Patient: Richard Mendoza  Procedure(s) Performed: SHOULDER ARTHROSCOPY WITH INCISION AND DRAINAGE (Right: Shoulder)  Patient location during evaluation: PACU Anesthesia Type: General Level of consciousness: awake and alert Pain management: pain level controlled Vital Signs Assessment: post-procedure vital signs reviewed and stable Respiratory status: spontaneous breathing, nonlabored ventilation, respiratory function stable and patient connected to nasal cannula oxygen Cardiovascular status: blood pressure returned to baseline and stable Postop Assessment: no apparent nausea or vomiting Anesthetic complications: no   There were no known notable events for this encounter.   Last Vitals:  Vitals:   08/27/23 1432 08/27/23 1446  BP: (!) 158/91 (!) 167/96  Pulse: 86 90  Resp: (!) 21 (!) 23  Temp:    SpO2: 96% 96%    Last Pain:  Vitals:   08/27/23 1432  TempSrc:   PainSc: 0-No pain                 Louie Boston

## 2023-08-27 NOTE — Progress Notes (Addendum)
Progress Note   Patient: Richard Mendoza MWU:[id number] DOB: 30-May-1976 DOA: 08/20/2023     7 DOS: the patient was seen and examined on 08/27/2023   Brief hospital course: Richard Mendoza is a 47 y.o. male with medical history significant of uncontrolled type 2 diabetes, tobacco abuse, hypertension, hyperlipidemia, history of BKA presenting with diabetic stump infection.  Patient reports worsening drainage at the base of his stump over the past 1 to 2 weeks.  Patient is seen by vascular surgery, started on broad-spectrum antibiotics with cefepime, Flagyl and vancomycin.  MRI of the left lower extremity showed soft tissue abscess, tibial osteomyelitis, knee joint effusion. Blood culture 1 set positive for Staph aureus, epidermidis and strep agalactiae.  Patient is seen by ID, treated with vancomycin then changed to daptomycin. Patient also found to have significant bursitis in the right shoulder MRI, joint aspiration consistent with septic joint.  Joint wash 10/8.   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 Tibial osteomyelitis. Large left knee joint effusion, possible septic joint. MRSA bacteremia. Recurrent left stump infection with noted recent admission July of this year for similar issue Evaluated by vascular surgery with patient deferring conversion from BKA to AKA Blood culture 1 set positive for Staph  aureus, epidermidis and strep agalactiae.  Wound culture grow Staph aureus and group B strep. Patient is seen by orthopedics, knee tap showed increased WBC in the joint, but culture negative.  Followed by ID, antibiotic switched to daptomycin 10/3. Patient condition is stable, discussed with ID, patient be treated with daptomycin for 6 weeks.   Echocardiogram obtained to rule out valvular abnormality with possible endocarditis.  May not need a TEE if there is no major valvular abnormality as patient will be treated with antibiotics for 6 weeks.  Left shoulder bursitis. Possible septic joint of the left shoulder. MRI shoulder showed bursitis with large amount of effusion. Joint tap showed significant white cells, joint washout today.  Culture pending.  Antibiotics still on daptomycin.     Acute kidney injury on chronic kidney disease stage IIIa. Hyponatremia. Metabolic acidosis. Hypokalemia. Hypophosphatemia. Condition are improved, creatinine dropped down to 1.43 with GFR more than 60.       Hyperglycemia due to uncontrolled type 2 diabetes mellitus (HCC) Hypoglycemia. Continue insulin pump, dose decreased for hypoglycemia.   Tobacco abuse Advised to quit, continue nicotine patch.   Gastro-esophageal reflux disease without esophagitis PPI   Anemia of chronic disease Monitor CBC, B12 level normal.  Iron level low, ferritin elevated.  Added iron supplement.         Subjective:  Still significant shoulder pain.  Otherwise doing well.  Physical Exam: Vitals:   08/26/23 2007 08/27/23 0343 08/27/23 0739 08/27/23 1111  BP: (!) 162/86 (!) 150/79 (!) 145/82 (!) 146/80  Pulse: 78 77 78 78  Resp: 16 16 16 16   Temp: 98.8 F (37.1 C) 98.6 F (37 C) 98.7 F (37.1  C) 98.7 F (37.1 C)  TempSrc: Oral Oral Oral Temporal  SpO2: 99% 96% 99% 100%  Weight:    87.7 kg  Height:    5\' 10"  (1.778 m)   General exam: Appears calm and comfortable  Respiratory system: Clear to  auscultation. Respiratory effort normal. Cardiovascular system: S1 & S2 heard, RRR. No JVD, murmurs, rubs, gallops or clicks. No pedal edema. Gastrointestinal system: Abdomen is nondistended, soft and nontender. No organomegaly or masses felt. Normal bowel sounds heard. Central nervous system: Alert and oriented. No focal neurological deficits. Extremities: Left BKA. Skin: No rashes, lesions or ulcers Psychiatry: Judgement and insight appear normal. Mood & affect appropriate.    Data Reviewed:  Lab results reviewed.  Family Communication: None  Disposition: Status is: Inpatient Remains inpatient appropriate because: Severity of disease, IV treatment, inpatient procedure.     Time spent: 35 minutes  Author: Marrion Coy, MD 08/27/2023 11:15 AM  For on call review [email or web address].

## 2023-08-27 NOTE — Anesthesia Procedure Notes (Signed)
Procedure Name: Intubation Date/Time: 08/27/2023 12:44 PM  Performed by: Maryla Morrow., CRNAPre-anesthesia Checklist: Patient identified, Patient being monitored, Timeout performed, Emergency Drugs available and Suction available Patient Re-evaluated:Patient Re-evaluated prior to induction Oxygen Delivery Method: Circle system utilized Preoxygenation: Pre-oxygenation with 100% oxygen Induction Type: IV induction Ventilation: Mask ventilation without difficulty Laryngoscope Size: McGraph and 4 Grade View: Grade I Tube type: Oral Tube size: 7.5 mm Number of attempts: 1 Airway Equipment and Method: Stylet Placement Confirmation: ETT inserted through vocal cords under direct vision, positive ETCO2 and breath sounds checked- equal and bilateral Secured at: 22 cm Tube secured with: Tape Dental Injury: Teeth and Oropharynx as per pre-operative assessment

## 2023-08-27 NOTE — Discharge Instructions (Signed)
   Information for Discharge Teaching:  DO NOT REMOVE TEAL EXPAREL BRACELET FOR 4 Days, (96 hours) 08/31/2023 EXPAREL (bupivacaine liposome injectable suspension)   Pain relief is important to your recovery. The goal is to control your pain so you can move easier and return to your normal activities as soon as possible after your procedure. Your physician may use several types of medicines to manage pain, swelling, and more.  Your surgeon or anesthesiologist gave you EXPAREL(bupivacaine) to help control your pain after surgery.  EXPAREL is a local anesthetic designed to release slowly over an extended period of time to provide pain relief by numbing the tissue around the surgical site. EXPAREL is designed to release pain medication over time and can control pain for up to 72 hours. Depending on how you respond to EXPAREL, you may require less pain medication during your recovery. EXPAREL can help reduce or eliminate the need for opioids during the first few days after surgery when pain relief is needed the most. EXPAREL is not an opioid and is not addictive. It does not cause sleepiness or sedation.   Important! A teal colored band has been placed on your arm with the date, time and amount of EXPAREL you have received. Please leave this armband in place for the full 96 hours following administration, and then you may remove the band. If you return to the hospital for any reason within 96 hours following the administration of EXPAREL, the armband provides important information that your health care providers to know, and alerts them that you have received this anesthetic.    Possible side effects of EXPAREL: Temporary loss of sensation or ability to move in the area where medication was injected. Nausea, vomiting, constipation Rarely, numbness and tingling in your mouth or lips, lightheadedness, or anxiety may occur. Call your doctor right away if you think you may be experiencing any of these  sensations, or if you have other questions regarding possible side effects.  Follow all other discharge instructions given to you by your surgeon or nurse. Eat a healthy diet and drink plenty of water or other fluids.

## 2023-08-27 NOTE — Anesthesia Preprocedure Evaluation (Signed)
Anesthesia Evaluation  Patient identified by MRN, date of birth, ID band Patient awake    Reviewed: Allergy & Precautions, NPO status , Patient's Chart, lab work & pertinent test results  History of Anesthesia Complications Negative for: history of anesthetic complications  Airway Mallampati: III  TM Distance: >3 FB Neck ROM: full    Dental no notable dental hx.    Pulmonary sleep apnea , Current Smoker   Pulmonary exam normal        Cardiovascular hypertension, On Medications + Peripheral Vascular Disease  Normal cardiovascular exam     Neuro/Psych  PSYCHIATRIC DISORDERS  Depression     Neuromuscular disease    GI/Hepatic Neg liver ROS,GERD  Controlled,,  Endo/Other  negative endocrine ROSdiabetes, Poorly Controlled, Type 2, Insulin Dependent    Renal/GU Renal disease     Musculoskeletal   Abdominal   Peds  Hematology  (+) Blood dyscrasia, anemia   Anesthesia Other Findings Past Medical History: 03/17/2009: DIABETES MELLITUS, TYPE II, UNCONTROLLED 12/08/2008: DM 03/17/2009: HYPERLIPIDEMIA 12/08/2008: HYPERTENSION 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  Past Surgical History: 11/07/2020: AMPUTATION; Left     Comment:  Procedure: AMPUTATION LEFT THIRD TOE WITH PARTIAL RAY               RESECTION;  Surgeon: Linus Galas, DPM;  Location: [facility]               ORS;  Service: Podiatry;  Laterality: Left; 11/16/2020: AMPUTATION; Left     Comment:  Procedure: AMPUTATION BELOW KNEE;  Surgeon: Annice Needy, MD;  Location: [facility] ORS;  Service: Vascular;                Laterality: Left; 09/09/2017: ANTERIOR CERVICAL DECOMP/DISCECTOMY FUSION; N/A     Comment:  Procedure: ANTERIOR CERVICAL DECOMPRESSION/DISCECTOMY               FUSION CERVICAL 6- CERVICAL 7;  Surgeon: Coletta Memos,               MD;  Location: [facility] OR;  Service: Neurosurgery;                Laterality: N/A;  ANTERIOR CERVICAL                DECOMPRESSION/DISCECTOMY FUSION CERVICAL 6- CERVICAL 7 No date: APPENDECTOMY 10/03/2017: I & D EXTREMITY; Right     Comment:  Procedure: IRRIGATION AND DEBRIDEMENT RIGHT WRIST;                Surgeon: Betha Loa, MD;  Location: [facility] OR;  Service:               Orthopedics;  Laterality: Right; 11/26/2018: I & D EXTREMITY; Right     Comment:  Procedure: IRRIGATION AND DEBRIDEMENT FASCIA ON RIGHT               FOOT;  Surgeon: Gwyneth Revels, DPM;  Location: [facility] ORS;              Service: Podiatry;  Laterality: Right; 03/06/2019: INCISION AND DRAINAGE; Right     Comment:  Procedure: INCISION AND DRAINAGE RIGHT FOOT, WITH 4th               RAY AMPUTATION;  Surgeon: Gwyneth Revels, DPM;  Location:              [facility] ORS;  Service: Podiatry;  Laterality: Right; 11/07/2020: INCISION AND DRAINAGE; Left  Comment:  Procedure: INCISION AND DRAINAGE;  Surgeon: Linus Galas,              DPM;  Location: [facility] ORS;  Service: Podiatry;                Laterality: Left; 05/02/2023: INCISION AND DRAINAGE ABSCESS; Left     Comment:  Procedure: INCISION AND DRAINAGE ABSCESS LEFT LOWER               EXTREMITY;  Surgeon: Annice Needy, MD;  Location: [facility]               ORS;  Service: Vascular;  Laterality: Left; 11/11/2020: IRRIGATION AND DEBRIDEMENT FOOT; Left     Comment:  Procedure: IRRIGATION AND DEBRIDEMENT FOOT;  Surgeon:               Linus Galas, DPM;  Location: [facility] ORS;  Service:               Podiatry;  Laterality: Left; 05/15/2019: METATARSAL HEAD EXCISION; Right     Comment:  Procedure: OSTECTOMY;MET HEAD 5;  Surgeon: Gwyneth Revels, DPM;  Location: [facility] ORS;  Service: Podiatry;                Laterality: Right; No date: osteomylitis No date: ROTATOR CUFF REPAIR; Left  BMI    Body Mass Index: 27.74 kg/m      Reproductive/Obstetrics negative OB ROS                             Anesthesia Physical Anesthesia Plan  ASA: 3  Anesthesia Plan: General ETT    Post-op Pain Management: Regional block*   Induction: Intravenous  PONV Risk Score and Plan: 2 and Ondansetron, Dexamethasone, Midazolam and Treatment may vary due to age or medical condition  Airway Management Planned: Oral ETT  Additional Equipment:   Intra-op Plan:   Post-operative Plan: Extubation in OR  Informed Consent: I have reviewed the patients History and Physical, chart, labs and discussed the procedure including the risks, benefits and alternatives for the proposed anesthesia with the patient or authorized representative who has indicated his/her understanding and acceptance.     Dental Advisory Given  Plan Discussed with: Anesthesiologist, CRNA and Surgeon  Anesthesia Plan Comments: (Patient consented for risks of anesthesia including but not limited to:  - adverse reactions to medications - damage to eyes, teeth, lips or other oral mucosa - nerve damage due to positioning  - sore throat or hoarseness - Damage to heart, brain, nerves, lungs, other parts of body or loss of life  Patient voiced understanding and assent.)        Anesthesia Quick Evaluation

## 2023-08-27 NOTE — Op Note (Signed)
Operative Note    SURGERY DATE: 08/27/2023   PRE-OP DIAGNOSIS:  1. Septic arthritis of Right shoulder   POST-OP DIAGNOSIS:  1. Septic arthritis of Right shoulder   PROCEDURES:  1. Right shoulder arthroscopic irrigation and debridement  2. Right shoulder arthroscopic complete synovectomy   SURGEON: Rosealee Albee, MD  ASSISTANTS: none   ANESTHESIA: Gen    ESTIMATED BLOOD LOSS: 5cc   TOTAL IV FLUIDS: per anesthesia  DRAIN: Hemovac x 2   INDICATION(S):  Richard Mendoza is a 47 y.o. male with acute onset of severe shoulder pain that started approximately 1 week ago.  There was a traumatic event associated around onset of time of injury.  MRI did not show a large full-thickness tear, but did show significant fluid about the shoulder.  Given continued lack of improvement and MRSA bacteremia, shoulder aspiration was performed.  This showed over 55,000 WBCs and negative crystal exam.  These findings were consistent with septic arthritis of the shoulder. After discussion of risks, benefits, and alternatives to surgery, the patient elected to proceed.    OPERATIVE FINDINGS: No gross purulence, but cloudy fluid returned from the glenohumeral joint.  Significant fibrinous material in the glenohumeral joint and subacromial space consistent with diagnosis of septic arthritis.  Small full-thickness supraspinatus tear also identified.   Examination under anesthesia: A careful examination under anesthesia was performed.  Passive range of motion was: FF: 130; ER at side: 40; ER in abduction:80; IR in abduction:30.  Anterior load shift: NT.  Posterior load shift: NT.  Sulcus in neutral: NT.  Sulcus in ER: NT.    Intra-operative findings: A thorough arthroscopic examination of the shoulder was performed.  The findings are: 1. Biceps tendon: Intact, mild tendinopathy about biceps anchor 2. Superior labrum: Normal 3. Posterior labrum and capsule: Severe synovitis of capsule 4. Inferior capsule and  inferior recess: Severe synovitis 5. Glenoid cartilage surface: Normal 6. Supraspinatus attachment: Small full-thickness tear 7. Posterior rotator cuff attachment: normal 8. Humeral head articular cartilage: Small usually, focal areas of grade 3 degenerative change; otherwise normal 9. Rotator interval: Severe synovitis 10: Subscapularis tendon: attachment intact 11. Anterior labrum: Significant erythema 12. IGHL: Synovitic    OPERATIVE REPORT:   I identified Richard Mendoza in the pre-operative holding area. Informed consent was obtained and the surgical site was marked. I reviewed the risks and benefits of the proposed surgical intervention and the patient (and/or patient's guardian) wished to proceed. The patient was transferred to the operative suite and general anesthesia was administered. The patient was placed in the beach chair position with the head of the bed elevated approximately 90 degrees. All down side pressure points were appropriately padded. The extremity was then prepped and draped in standard fashion. A time out was performed confirming the correct extremity, correct patient, and correct procedure.   I then created a standard posterior portal with an 11 blade. The glenohumeral joint was easily entered with a blunt trocar. There was return of cloudy, yellow synovial fluid that returned out of the trocar.  There was no gross pus.  Culture samples were obtained from this fluid.  The arthroscope was introduced. The findings of diagnostic arthroscopy are described above. I debrided degenerative tissue including cartilaginous surfaces. I debrided and excised and then also coagulated the inflamed synovium using an Arthrocare radiofrequency device.  There is also significant fibrinous material about the anterior, inferior, and posterior portions of the joint as well as overlying the glenoid and humeral head.  This was debrided  using an oscillating shaver.  Essentially, a complete synovectomy  was performed to remove all infectious material.  Copious fluid was run through the joint.   The arthroscope was then placed in the subacromial space. An accessory lateral portal was established. A complete subacromial bursectomy and debridement of the gutters was carried out with a shaver.  There was also significant fibrinous appearing material throughout the subacromial space, which was also removed with an oscillating shaver.  ArthroCare was used to control bleeding.  Of note, there was a small, full-thickness tear of the supraspinatus.  Copious fluid was run through the subacromial space.  A drain was then placed through the lateral portal into the subacromial space.  Next, I reentered the glenohumeral joint and a drain was placed through the anterior portal and connected to a Hemovac.  We closed the portals with 3-0 Nylon sutures. Wounds were covered with xeroform and a sterile dressing. PolarCare and sling were applied. The patient was awakened from anesthesia without complication and taken to the PACU for further recovery.  POSTOPERATIVE PLAN: The patient will return to the floor. Continue IV Abx. ID Consult. F/U OR Culture results.  ASA 325 mg daily for DVT prophylaxis to start tomorrow.  Maintain drains times at least 2 days.  Sling x 2 weeks.  Patient may use wrist, hand, fingers, and elbow for daily activities.

## 2023-08-28 ENCOUNTER — Inpatient Hospital Stay (HOSPITAL_COMMUNITY)
Admit: 2023-08-28 | Discharge: 2023-08-28 | Disposition: A | Discharge status: Home / Self Care | Payer: Medicaid Other | Attending: Internal Medicine | Admitting: Internal Medicine

## 2023-08-28 ENCOUNTER — Encounter: Payer: Self-pay | Admitting: Orthopedic Surgery

## 2023-08-28 DIAGNOSIS — I38 Endocarditis, valve unspecified: Secondary | ICD-10-CM | POA: Diagnosis not present

## 2023-08-28 DIAGNOSIS — T8740 Infection of amputation stump, unspecified extremity: Secondary | ICD-10-CM | POA: Diagnosis not present

## 2023-08-28 LAB — URINE DRUG SCREEN, QUALITATIVE (ARMC ONLY)
Amphetamines, Ur Screen: NOT DETECTED
Barbiturates, Ur Screen: NOT DETECTED
Benzodiazepine, Ur Scrn: POSITIVE — AB
Cannabinoid 50 Ng, Ur ~~LOC~~: NOT DETECTED
Cocaine Metabolite,Ur ~~LOC~~: NOT DETECTED
MDMA (Ecstasy)Ur Screen: NOT DETECTED
Methadone Scn, Ur: NOT DETECTED
Opiate, Ur Screen: POSITIVE — AB
Phencyclidine (PCP) Ur S: NOT DETECTED
Tricyclic, Ur Screen: NOT DETECTED

## 2023-08-28 LAB — BLOOD GAS, VENOUS
Acid-base deficit: 2.5 mmol/L — ABNORMAL HIGH (ref 0.0–2.0)
Bicarbonate: 22.5 mmol/L (ref 20.0–28.0)
O2 Saturation: 75.2 %
Patient temperature: 37
pCO2, Ven: 39 mm[Hg] — ABNORMAL LOW (ref 44–60)
pH, Ven: 7.37 (ref 7.25–7.43)
pO2, Ven: 43 mm[Hg] (ref 32–45)

## 2023-08-28 LAB — ECHOCARDIOGRAM COMPLETE
AR max vel: 3.29 cm2
AV Area VTI: 3.36 cm2
AV Area mean vel: 3.34 cm2
AV Mean grad: 4 mm[Hg]
AV Peak grad: 6.7 mm[Hg]
Ao pk vel: 1.29 m/s
Area-P 1/2: 3.87 cm2
Height: 70 in
MV VTI: 4.1 cm2
S' Lateral: 3.4 cm
Weight: 3093.49 [oz_av]

## 2023-08-28 LAB — BASIC METABOLIC PANEL
Anion gap: 8 (ref 5–15)
BUN: 36 mg/dL — ABNORMAL HIGH (ref 6–20)
CO2: 22 mmol/L (ref 22–32)
Calcium: 8.1 mg/dL — ABNORMAL LOW (ref 8.9–10.3)
Chloride: 98 mmol/L (ref 98–111)
Creatinine, Ser: 1.9 mg/dL — ABNORMAL HIGH (ref 0.61–1.24)
GFR, Estimated: 43 mL/min — ABNORMAL LOW (ref >60)
Glucose, Bld: 469 mg/dL — ABNORMAL HIGH (ref 70–99)
Potassium: 5.2 mmol/L — ABNORMAL HIGH (ref 3.5–5.1)
Sodium: 128 mmol/L — ABNORMAL LOW (ref 135–145)

## 2023-08-28 LAB — CK: Total CK: 440 U/L — ABNORMAL HIGH (ref 49–397)

## 2023-08-28 LAB — GLUCOSE, CAPILLARY
Glucose-Capillary: 255 mg/dL — ABNORMAL HIGH (ref 70–99)
Glucose-Capillary: 256 mg/dL — ABNORMAL HIGH (ref 70–99)
Glucose-Capillary: 278 mg/dL — ABNORMAL HIGH (ref 70–99)
Glucose-Capillary: 339 mg/dL — ABNORMAL HIGH (ref 70–99)
Glucose-Capillary: 396 mg/dL — ABNORMAL HIGH (ref 70–99)
Glucose-Capillary: 433 mg/dL — ABNORMAL HIGH (ref 70–99)
Glucose-Capillary: 449 mg/dL — ABNORMAL HIGH (ref 70–99)
Glucose-Capillary: 455 mg/dL — ABNORMAL HIGH (ref 70–99)
Glucose-Capillary: 457 mg/dL — ABNORMAL HIGH (ref 70–99)
Glucose-Capillary: 510 mg/dL (ref 70–99)

## 2023-08-28 LAB — CBC
HCT: 26.6 % — ABNORMAL LOW (ref 39.0–52.0)
Hemoglobin: 8.9 g/dL — ABNORMAL LOW (ref 13.0–17.0)
MCH: 27.3 pg (ref 26.0–34.0)
MCHC: 33.5 g/dL (ref 30.0–36.0)
MCV: 81.6 fL (ref 80.0–100.0)
Platelets: 375 10*3/uL (ref 150–400)
RBC: 3.26 MIL/uL — ABNORMAL LOW (ref 4.22–5.81)
RDW: 15.2 % (ref 11.5–15.5)
WBC: 15 10*3/uL — ABNORMAL HIGH (ref 4.0–10.5)
nRBC: 0 % (ref 0.0–0.2)

## 2023-08-28 LAB — BETA-HYDROXYBUTYRIC ACID: Beta-Hydroxybutyric Acid: 0.08 mmol/L (ref 0.05–0.27)

## 2023-08-28 LAB — MAGNESIUM: Magnesium: 2.4 mg/dL (ref 1.7–2.4)

## 2023-08-28 MED ORDER — HYDRALAZINE HCL 20 MG/ML IJ SOLN
5.0000 mg | INTRAMUSCULAR | Status: DC | PRN
  Administered 2023-08-28: 5 mg via INTRAVENOUS
  Filled 2023-08-28: qty 1

## 2023-08-28 MED ORDER — LACTATED RINGERS IV SOLN: INTRAVENOUS | Status: AC

## 2023-08-28 MED ORDER — INSULIN ASPART 100 UNIT/ML IJ SOLN
0.0000 [IU] | INTRAMUSCULAR | Status: DC
  Administered 2023-08-28: 11 [IU] via SUBCUTANEOUS
  Administered 2023-08-28: 15 [IU] via SUBCUTANEOUS
  Filled 2023-08-28 (×2): qty 1

## 2023-08-28 MED ORDER — INSULIN ASPART 100 UNIT/ML IJ SOLN
20.0000 [IU] | Freq: Once | INTRAMUSCULAR | Status: AC
  Administered 2023-08-28: 20 [IU] via SUBCUTANEOUS
  Filled 2023-08-28: qty 1

## 2023-08-28 NOTE — Progress Notes (Signed)
ID Await rt should fluid culture CPK [id number]- could be due to surgery Follow cloeseylas on dapto Check with lab regarding MRSA vS MSSA

## 2023-08-28 NOTE — Inpatient Diabetes Management (Signed)
Inpatient Diabetes Program Recommendations  AACE/ADA: New Consensus Statement on Inpatient Glycemic Control (2015)  Target Ranges:  Prepandial:   less than 140 mg/dL      Peak postprandial:   less than 180 mg/dL (1-2 hours)      Critically ill patients:  140 - 180 mg/dL   Lab Results  Component Value Date   GLUCAP 256 (H) 08/28/2023   HGBA1C 13.2 (H) 08/20/2023    Latest Reference Range & Units 08/27/23 20:31 08/28/23 00:36 08/28/23 01:17 08/28/23 01:21 08/28/23 02:36 08/28/23 02:55 08/28/23 03:57 08/28/23 05:15 08/28/23 08:07  Glucose-Capillary 70 - 99 mg/dL 469 (H) 629 (H) 528 (HH) 457 (H) 449 (H) 433 (H) 396 (H) 339 (H) 256 (H)  (HH): Data is critically high (H): Data is abnormally high  Review of Glycemic Control  Diabetes history: DM Outpatient Diabetes medications: Medtronic Insulin Pump with Fiasp Insulin                              Dexcom G6 CGM Current orders for Inpatient glycemic control: Novolog 0-20 units q 4 hrs.  Inpatient Diabetes Program Recommendations:   Decadron 5 mg given prior to surgery @ 12:26 pm 08/27/23 and CBGs elevated >400. Insulin pump was removed @ 3:30 am due to CBGs remaining high after insulin correction given. Discussed with Dr. Ophelia Charter and patient. Patient reconnected insulin pump and new Dexcom G7 placed on left arm per orders Dr. Ophelia Charter. Patient was unaware he had received steroids prior to surgery and discussed rise in CBGs post Decadron. Patient stated "that answers my questions why my blood sugar went up so high". Will follow during hospitalization. Patient plans to make endo appt. As soon as he is out of the hospital for insulin pump rates.  Thank you, Richard Mendoza. Richard Harbeson, RN, MSN, CDE  Diabetes Coordinator Inpatient Glycemic Control Team Team Pager [phone] (8am-5pm) 08/28/2023 9:35 AM

## 2023-08-28 NOTE — Progress Notes (Signed)
Progress Note   Patient: Richard Mendoza YNW:[id number] DOB: [birth year] DOA: 08/20/2023     8 DOS: the patient was seen and examined on 08/28/2023   Brief hospital course: 47yo with medical history significant of uncontrolled type 2 diabetes, tobacco abuse, hypertension, hyperlipidemia, and history of BKA who presented on 10/1 with diabetic stump infection.  Patient is seen by vascular surgery, started on broad-spectrum antibiotics with cefepime, Flagyl and vancomycin.  MRI of the left lower extremity showed soft tissue abscess, tibial osteomyelitis, knee joint effusion. Blood culture 1 set positive for Staph aureus, epidermidis and strep agalactiae.  Patient seen by ID, treated with vancomycin then changed to daptomycin.  Patient also found to have significant bursitis in the right shoulder MRI, joint aspiration consistent with septic joint.  Joint washout on 10/8.  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  Wound culture grew Staph aureus and group B strep. Patient seen by orthopedics, knee tap showed increased WBC in the joint, but culture negative.  Followed by ID, antibiotic switched to daptomycin 10/3. Patient condition is stable, discussed with ID, patient be treated with daptomycin for 6 weeks, has PICC line.   Echocardiogram obtained to rule out valvular abnormality with possible endocarditis, no obvious valvular abnormality noted  Will not plan TEE at this time, as patient will be treated with antibiotics for 6 weeks   Left shoulder bursitis, Possible septic arthritis MRI shoulder showed bursitis with large amount of effusion Joint tap showed significant white  cells Shoulder arthroscopy on 10/8, culture pending On daptomycin   Acute kidney injury on chronic kidney disease stage IIIa Baseline creatinine 1.58, GFR 54 on 7/14 Creatinine 2.89 on admission with GFR 26, new presumed hypovolemic hyponatremia This was improving but appears to have worsened overnight Glucose was suboptimally controlled so possibly recurrent hypovolemia  Will give LR at 100 cc/hr x 10 hours and recheck BMP in AM   Uncontrolled type 2 diabetes mellitus  A1c 13.2, very poor control He was given steroids in the OR yesterday and had elevated glucose Insulin pump was stopped and he was made NPO overnight Will resume insulin pump and allow patient to eat Continue to follow   Tobacco abuse Advised to quit Continue nicotine patch.   Gastro-esophageal reflux disease without esophagitis PPI   Anemia of chronic disease Monitor CBC, B12 level normal.  Iron level low, ferritin elevated.  Added iron supplement.   Increased nutrient needs Nutrition Problem: Increased nutrient needs Etiology: wound healing Signs/Symptoms: estimated needs         Consultants: Vascular surgery Orthopedics ID TOC team Nutrition  Procedures: Shoulder arthroscopy 10/8 Echocardiogram 10/9  Antibiotics: Daptomycin 10/3- Vancomycin 10/1-3  30 Day Unplanned Readmission Risk Score    Flowsheet Row ED to Hosp-Admission (Current) from 08/20/2023 in [facility]  30 Day Unplanned Readmission Risk Score (%) 22.91 Filed at 08/28/2023 0801       This score is the patient's risk of an unplanned readmission within 30 days of being discharged (0 -100%). The score is based on dignosis, age, lab data, medications, orders, and past utilization.   Low:  0-14.9   Medium: 15-21.9   High: 22-29.9   Extreme: 30 and above  Subjective: Feeling better.  Thinks he will be able to use his R arm to assist with mobility soon.  Reports that drains will  come out tomorrow.  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   Objective: Vitals:   08/28/23 1018 08/28/23 1505  BP: (!) 166/88 (!) 166/84  Pulse:  86  Resp:  16  Temp:  98.1 F (36.7 C)  SpO2:  99%    Intake/Output Summary (Last 24 hours) at 08/28/2023 1559 Last data filed at 08/28/2023 1506 Gross per 24 hour  Intake 1320 ml  Output 2040 ml  Net -720 ml   Filed Weights   08/25/23 0500 08/27/23 1111 08/28/23 0711  Weight: 87.7 kg 87.7 kg 88.6 kg    Exam:  General:  Appears calm and comfortable and is in NAD Eyes:  EOMI, normal lids, iris ENT:  grossly normal hearing, lips & tongue, mmm; edentulous Neck:  no LAD, masses or thyromegaly Cardiovascular:  RRR, no m/r/g. No LE edema.  Respiratory:   CTA bilaterally with no wheezes/rales/rhonchi.  Normal respiratory effort. Abdomen:  soft, NT, ND Skin:  no rash or induration seen on limited exam Musculoskeletal:  RUE in sling with drain in place, bandage on L BKA stump is C/D/I Psychiatric:  grossly normal mood and affect, speech fluent and appropriate, AOx3 Neurologic:  CN 2-12 grossly intact, moves all extremities in coordinated fashion other than RUE  Data Reviewed: I have reviewed the patient's lab results since admission.  Pertinent labs for today include:   Na++ 129, slightly improved K+ 5.2 Glucose 469 BUN 36/Creatinine 1.9/GFR 43 - improving; baseline creatinine 1.6, GFR 50s CK 440 WBC 15 Hgb 8.9, 10.7 on presentation  VBG 7.37/39/22.5   Family Communication: Mother and another family member were present throughout evaluation  Disposition: Status is: Inpatient Remains inpatient appropriate because: ongoing antibiotics     Time spent: 50 minutes  Unresulted Labs (From admission, onward)     Start     Ordered   08/29/23 0500  CBC with Differential/Platelet  Tomorrow morning,   R       Question:   Specimen collection method  Answer:  Lab=Lab collect   08/28/23 1549   08/29/23 0500  Basic metabolic panel  Tomorrow morning,   R       Question:  Specimen collection method  Answer:  Lab=Lab collect   08/28/23 1549   08/28/23 0500  CK  Weekly,   TIMED     Question:  Specimen collection method  Answer:  Lab=Lab collect   08/27/23 1130             Author: Jonah Blue, MD 08/28/2023 3:59 PM  For on call review [email or web address].

## 2023-08-28 NOTE — Progress Notes (Signed)
*  PRELIMINARY RESULTS* Echocardiogram 2D Echocardiogram has been performed.  Cristela Blue 08/28/2023, 8:48 AM

## 2023-08-28 NOTE — Progress Notes (Addendum)
Patient had a CBG of 457. After patient administered 17.3 units of insulin from insulin pump, cbg remained 457. Patient self adminstered another 14 units, then 10 units via insulin pump.MD notified. Verbal order for 20 units of novolog and recheck cbg in 1 hour. Patient agreeable. 20 units of novolog administered. After 1 hour cbg is 457, did recheck after 5 minutes cbg is 433. MD notified and placed another verbal order for 20 units of novolog. MD Ordered bnp, and venous blood gas. Will recheck cbg 1 hour after administration of 2nd dose of novolog. per verbal order. Recheck cbg showed 396. MD notified.   4132: MD ordered NPO, and sliding scale insulin. Insulin pump already removed.

## 2023-08-28 NOTE — Plan of Care (Signed)
  Problem: Education: Goal: Ability to describe self-care measures that may prevent or decrease complications (Diabetes Survival Skills Education) will improve Outcome: Progressing Goal: Individualized Educational Video(s) Outcome: Progressing   Problem: Coping: Goal: Ability to adjust to condition or change in health will improve Outcome: Progressing   Problem: Fluid Volume: Goal: Ability to maintain a balanced intake and output will improve Outcome: Progressing   Problem: Health Behavior/Discharge Planning: Goal: Ability to identify and utilize available resources and services will improve Outcome: Progressing Goal: Ability to manage health-related needs will improve Outcome: Progressing   Problem: Nutritional: Goal: Maintenance of adequate nutrition will improve Outcome: Progressing Goal: Progress toward achieving an optimal weight will improve Outcome: Progressing   Problem: Skin Integrity: Goal: Risk for impaired skin integrity will decrease Outcome: Progressing   Problem: Tissue Perfusion: Goal: Adequacy of tissue perfusion will improve Outcome: Progressing   Problem: Cardiac: Goal: Ability to maintain an adequate cardiac output will improve Outcome: Progressing   Problem: Education: Goal: Ability to describe self-care measures that may prevent or decrease complications (Diabetes Survival Skills Education) will improve Outcome: Progressing Goal: Individualized Educational Video(s) Outcome: Progressing   Problem: Health Behavior/Discharge Planning: Goal: Ability to identify and utilize available resources and services will improve Outcome: Progressing Goal: Ability to manage health-related needs will improve Outcome: Progressing   Problem: Fluid Volume: Goal: Ability to achieve a balanced intake and output will improve Outcome: Progressing   Problem: Respiratory: Goal: Will regain and/or maintain adequate ventilation Outcome: Progressing   Problem:  Nutritional: Goal: Maintenance of adequate nutrition will improve Outcome: Progressing Goal: Maintenance of adequate weight for body size and type will improve Outcome: Progressing   Problem: Urinary Elimination: Goal: Ability to achieve and maintain adequate renal perfusion and functioning will improve Outcome: Progressing   Problem: Education: Goal: Knowledge of General Education information will improve Description: Including pain rating scale, medication(s)/side effects and non-pharmacologic comfort measures Outcome: Progressing   Problem: Clinical Measurements: Goal: Ability to maintain clinical measurements within normal limits will improve Outcome: Progressing Goal: Will remain free from infection Outcome: Progressing Goal: Diagnostic test results will improve Outcome: Progressing Goal: Respiratory complications will improve Outcome: Progressing Goal: Cardiovascular complication will be avoided Outcome: Progressing   Problem: Skin Integrity: Goal: Risk for impaired skin integrity will decrease Outcome: Progressing   Problem: Safety: Goal: Ability to remain free from injury will improve Outcome: Progressing   Problem: Pain Managment: Goal: General experience of comfort will improve Outcome: Progressing   Problem: Elimination: Goal: Will not experience complications related to bowel motility Outcome: Progressing Goal: Will not experience complications related to urinary retention Outcome: Progressing

## 2023-08-28 NOTE — Plan of Care (Signed)
  Problem: Education: Goal: Ability to describe self-care measures that may prevent or decrease complications (Diabetes Survival Skills Education) will improve Outcome: Progressing   Problem: Coping: Goal: Ability to adjust to condition or change in health will improve Outcome: Progressing   Problem: Fluid Volume: Goal: Ability to maintain a balanced intake and output will improve Outcome: Progressing   

## 2023-08-28 NOTE — Plan of Care (Signed)
  Problem: Education: Goal: Ability to describe self-care measures that may prevent or decrease complications (Diabetes Survival Skills Education) will improve Outcome: Progressing Goal: Individualized Educational Video(s) Outcome: Progressing   Problem: Coping: Goal: Ability to adjust to condition or change in health will improve Outcome: Progressing   Problem: Fluid Volume: Goal: Ability to maintain a balanced intake and output will improve Outcome: Progressing   Problem: Health Behavior/Discharge Planning: Goal: Ability to identify and utilize available resources and services will improve Outcome: Progressing Goal: Ability to manage health-related needs will improve Outcome: Progressing   Problem: Metabolic: Goal: Ability to maintain appropriate glucose levels will improve Outcome: Progressing   Problem: Nutritional: Goal: Maintenance of adequate nutrition will improve Outcome: Progressing Goal: Progress toward achieving an optimal weight will improve Outcome: Progressing   Problem: Skin Integrity: Goal: Risk for impaired skin integrity will decrease Outcome: Progressing   Problem: Tissue Perfusion: Goal: Adequacy of tissue perfusion will improve Outcome: Progressing   Problem: Education: Goal: Ability to describe self-care measures that may prevent or decrease complications (Diabetes Survival Skills Education) will improve Outcome: Progressing Goal: Individualized Educational Video(s) Outcome: Progressing   Problem: Cardiac: Goal: Ability to maintain an adequate cardiac output will improve Outcome: Progressing   Problem: Health Behavior/Discharge Planning: Goal: Ability to identify and utilize available resources and services will improve Outcome: Progressing Goal: Ability to manage health-related needs will improve Outcome: Progressing   Problem: Fluid Volume: Goal: Ability to achieve a balanced intake and output will improve Outcome: Progressing    Problem: Metabolic: Goal: Ability to maintain appropriate glucose levels will improve Outcome: Progressing   Problem: Nutritional: Goal: Maintenance of adequate nutrition will improve Outcome: Progressing Goal: Maintenance of adequate weight for body size and type will improve Outcome: Progressing   Problem: Respiratory: Goal: Will regain and/or maintain adequate ventilation Outcome: Progressing   Problem: Urinary Elimination: Goal: Ability to achieve and maintain adequate renal perfusion and functioning will improve Outcome: Progressing   Problem: Clinical Measurements: Goal: Ability to maintain clinical measurements within normal limits will improve Outcome: Progressing Goal: Will remain free from infection Outcome: Progressing Goal: Diagnostic test results will improve Outcome: Progressing Goal: Respiratory complications will improve Outcome: Progressing Goal: Cardiovascular complication will be avoided Outcome: Progressing   Problem: Pain Managment: Goal: General experience of comfort will improve Outcome: Progressing   Problem: Elimination: Goal: Will not experience complications related to bowel motility Outcome: Progressing Goal: Will not experience complications related to urinary retention Outcome: Progressing   Problem: Safety: Goal: Ability to remain free from injury will improve Outcome: Progressing   Problem: Skin Integrity: Goal: Risk for impaired skin integrity will decrease Outcome: Progressing

## 2023-08-28 NOTE — Progress Notes (Signed)
Insulin pump removed at 330 AM

## 2023-08-28 NOTE — Progress Notes (Signed)
  Subjective: 1 Day Post-Op Procedure(s) (LRB): SHOULDER ARTHROSCOPY WITH INCISION AND DRAINAGE (Right) Patient reports pain as mild.  Nerve block still functioning.   Patient is well, and has had no acute complaints or problems Plan is to go Home after hospital stay. Negative for chest pain and shortness of breath Fever: no Gastrointestinal:Negative for nausea and vomiting  Objective: Vital signs in last 24 hours: Temp:  [97.4 F (36.3 C)-98.7 F (37.1 C)] 98.1 F (36.7 C) (10/09 0349) Pulse Rate:  [70-91] 73 (10/09 0349) Resp:  [16-30] 18 (10/09 0349) BP: (114-188)/(69-99) 188/96 (10/09 0349) SpO2:  [94 %-100 %] 100 % (10/09 0349) Weight:  [87.7 kg] 87.7 kg (10/08 1111)  Intake/Output from previous day:  Intake/Output Summary (Last 24 hours) at 08/28/2023 0726 Last data filed at 08/28/2023 0026 Gross per 24 hour  Intake 1400 ml  Output 1330 ml  Net 70 ml    Intake/Output this shift: No intake/output data recorded.  Labs: Recent Labs    08/28/23 0330  HGB 8.9*   Recent Labs    08/28/23 0330  WBC 15.0*  RBC 3.26*  HCT 26.6*  PLT 375   Recent Labs    08/28/23 0330  NA 128*  K 5.2*  CL 98  CO2 22  BUN 36*  CREATININE 1.90*  GLUCOSE 469*  CALCIUM 8.1*   No results for input(s): "LABPT", "INR" in the last 72 hours.   EXAM General - Patient is Alert and Oriented Extremity - Neurovascular intact Sensation intact distally Dorsiflexion/Plantar flexion intact Dressing/Incision - clean, dry, with hemovac in place Motor Function - intact, moving fingers and wrist well on exam.   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    Assessment/Plan: 1 Day Post-Op Procedure(s) (LRB): SHOULDER ARTHROSCOPY WITH INCISION AND DRAINAGE (Right) Principal Problem:   Amputation stump infection (HCC) Active Problems:    Anemia of chronic disease   Osteomyelitis (HCC)   Acute kidney injury superimposed on chronic kidney disease (HCC)   Hyperglycemia due to type 2 diabetes mellitus (HCC)   Gastro-esophageal reflux disease without esophagitis   Cellulitis of left lower extremity   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   Weight as of this encounter: 87.7 kg. Advance diet Up with therapy  Continue Abx therapy.    Discharge plan:  Plan to remove hemovac tomorrow.  Plan to follow up in 2 weeks for suture removal at [facility].    DVT Prophylaxis - Aspirin Sling to be used for 2 weeks  Dedra Skeens, PA-C Orthopaedic Surgery 08/28/2023, 7:26 AM

## 2023-08-29 DIAGNOSIS — M00011 Staphylococcal arthritis, right shoulder: Secondary | ICD-10-CM

## 2023-08-29 DIAGNOSIS — R7881 Bacteremia: Secondary | ICD-10-CM

## 2023-08-29 DIAGNOSIS — T8740 Infection of amputation stump, unspecified extremity: Secondary | ICD-10-CM | POA: Diagnosis not present

## 2023-08-29 DIAGNOSIS — B9561 Methicillin susceptible Staphylococcus aureus infection as the cause of diseases classified elsewhere: Secondary | ICD-10-CM

## 2023-08-29 LAB — CBC WITH DIFFERENTIAL/PLATELET
Abs Immature Granulocytes: 0.13 10*3/uL — ABNORMAL HIGH (ref 0.00–0.07)
Basophils Absolute: 0 10*3/uL (ref 0.0–0.1)
Basophils Relative: 0 %
Eosinophils Absolute: 0.2 10*3/uL (ref 0.0–0.5)
Eosinophils Relative: 1 %
HCT: 26.7 % — ABNORMAL LOW (ref 39.0–52.0)
Hemoglobin: 8.7 g/dL — ABNORMAL LOW (ref 13.0–17.0)
Immature Granulocytes: 1 %
Lymphocytes Relative: 16 %
Lymphs Abs: 2.1 10*3/uL (ref 0.7–4.0)
MCH: 27.8 pg (ref 26.0–34.0)
MCHC: 32.6 g/dL (ref 30.0–36.0)
MCV: 85.3 fL (ref 80.0–100.0)
Monocytes Absolute: 0.5 10*3/uL (ref 0.1–1.0)
Monocytes Relative: 4 %
Neutro Abs: 10.3 10*3/uL — ABNORMAL HIGH (ref 1.7–7.7)
Neutrophils Relative %: 78 %
Platelets: 392 10*3/uL (ref 150–400)
RBC: 3.13 MIL/uL — ABNORMAL LOW (ref 4.22–5.81)
RDW: 15.4 % (ref 11.5–15.5)
WBC: 13.3 10*3/uL — ABNORMAL HIGH (ref 4.0–10.5)
nRBC: 0 % (ref 0.0–0.2)

## 2023-08-29 LAB — BASIC METABOLIC PANEL
Anion gap: 8 (ref 5–15)
BUN: 31 mg/dL — ABNORMAL HIGH (ref 6–20)
CO2: 23 mmol/L (ref 22–32)
Calcium: 8.1 mg/dL — ABNORMAL LOW (ref 8.9–10.3)
Chloride: 105 mmol/L (ref 98–111)
Creatinine, Ser: 1.41 mg/dL — ABNORMAL HIGH (ref 0.61–1.24)
GFR, Estimated: >60 mL/min (ref >60)
Glucose, Bld: 144 mg/dL — ABNORMAL HIGH (ref 70–99)
Potassium: 4.1 mmol/L (ref 3.5–5.1)
Sodium: 136 mmol/L (ref 135–145)

## 2023-08-29 LAB — CULTURE, BLOOD (ROUTINE X 2): Special Requests: ADEQUATE

## 2023-08-29 LAB — GLUCOSE, CAPILLARY
Glucose-Capillary: 101 mg/dL — ABNORMAL HIGH (ref 70–99)
Glucose-Capillary: 110 mg/dL — ABNORMAL HIGH (ref 70–99)
Glucose-Capillary: 124 mg/dL — ABNORMAL HIGH (ref 70–99)
Glucose-Capillary: 129 mg/dL — ABNORMAL HIGH (ref 70–99)
Glucose-Capillary: 156 mg/dL — ABNORMAL HIGH (ref 70–99)
Glucose-Capillary: 173 mg/dL — ABNORMAL HIGH (ref 70–99)
Glucose-Capillary: 215 mg/dL — ABNORMAL HIGH (ref 70–99)

## 2023-08-29 LAB — CK: Total CK: 274 U/L (ref 49–397)

## 2023-08-29 MED ORDER — BENAZEPRIL HCL 20 MG PO TABS
20.0000 mg | ORAL_TABLET | Freq: Every day | ORAL | Status: DC
  Administered 2023-08-29 – 2023-08-30 (×2): 20 mg via ORAL
  Filled 2023-08-29 (×2): qty 1

## 2023-08-29 NOTE — Progress Notes (Signed)
Progress Note   Patient: Richard Mendoza ZOX:[id number] DOB: [birth year] DOA: 08/20/2023     9 DOS: the patient was seen and examined on 08/29/2023   Brief hospital course: 47yo with medical history significant of uncontrolled type 2 diabetes, tobacco abuse, hypertension, hyperlipidemia, and history of BKA who presented on 10/1 with diabetic stump infection.  Patient is seen by vascular surgery, started on broad-spectrum antibiotics with cefepime, Flagyl and vancomycin.  MRI of the left lower extremity showed soft tissue abscess, tibial osteomyelitis, knee joint effusion. Blood culture 1 set positive for Staph aureus, epidermidis and strep agalactiae.  Patient seen by ID, treated with vancomycin then changed to daptomycin.  Patient also found to have significant bursitis in the right shoulder MRI, joint aspiration consistent with septic joint.  Joint washout on 10/8.  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  Wound culture grew Staph aureus and group B strep. Patient seen by orthopedics, knee tap showed increased WBC in the joint, but culture negative.  Followed by ID, antibiotic switched to daptomycin 10/3. Patient condition is stable, discussed with ID, patient be treated with daptomycin for 6 weeks, has PICC line.   Echocardiogram obtained to rule out valvular abnormality with possible endocarditis, no obvious valvular abnormality noted  Will not plan TEE at this time, as patient will be treated with antibiotics for 6 weeks Will plan for dc tomorrow (10/11) with home health to assist for Daptomycin dosing   Left shoulder bursitis, Possible septic arthritis MRI shoulder showed  bursitis with large amount of effusion Joint tap showed significant white cells Shoulder arthroscopy on 10/8, culture pending On daptomycin   Acute kidney injury on chronic kidney disease stage IIIa Baseline creatinine 1.58, GFR 54 on 7/14 Creatinine 2.89 on admission with GFR 26, new presumed hypovolemic hyponatremia This appears to have normalized and is back to his baseline Repeat BMP in AM   Uncontrolled type 2 diabetes mellitus  A1c 13.2, very poor control He was given steroids in the OR yesterday and had elevated glucose Insulin pump was stopped and he was made NPO overnight Resumed insulin pump and allowed patient to eat on 10/9 Glycemic control is significantly improved   Tobacco abuse Advised to quit Continue nicotine patch.   Gastro-esophageal reflux disease without esophagitis PPI   Anemia of chronic disease Monitor CBC, B12 level normal.  Iron level low, ferritin elevated.  Added iron supplement.   Increased nutrient needs Nutrition Problem: Increased nutrient needs Etiology: wound healing Signs/Symptoms: estimated needs  HTN Benazepril held on 10/9 due to AKI Resumed on 10/10 Also with prn IV hydralazine  HLD Not currently taking atorvastatin at home Encourage outpatient discussion with PCP          Consultants: Vascular surgery Orthopedics ID [facility] team Nutrition   Procedures: Shoulder arthroscopy 10/8 Echocardiogram 10/9   Antibiotics: Daptomycin 10/3- Vancomycin 10/1-3   30 Day Unplanned Readmission Risk Score    Flowsheet Row ED to Hosp-Admission (Current) from 08/20/2023 in [facility]  30 Day Unplanned Readmission Risk Score (%) 29.25 Filed at 08/29/2023 0400       This score is the patient's risk of an unplanned readmission within 30 days of being discharged (0 -100%). The  score is based on dignosis, age, lab data, medications, orders, and past utilization.   Low:  0-14.9   Medium: 15-21.9    High: 22-29.9   Extreme: 30 and above           Subjective: Shoulder block is wearing off and he is having some pain today.  Feels ok with dc today but prefers tomorrow to ensure that pain is effectively controlled and everything is set up.   Objective: Vitals:   08/29/23 0752 08/29/23 1509  BP: (!) 195/111 (!) 148/87  Pulse: 85 89  Resp: 20 17  Temp: 98.1 F (36.7 C) 98.4 F (36.9 C)  SpO2: 100% 100%    Intake/Output Summary (Last 24 hours) at 08/29/2023 1514 Last data filed at 08/29/2023 0742 Gross per 24 hour  Intake 1649.97 ml  Output 1535 ml  Net 114.97 ml   Filed Weights   08/25/23 0500 08/27/23 1111 08/28/23 0711  Weight: 87.7 kg 87.7 kg 88.6 kg    Exam:  General:  Appears calm and comfortable and is in NAD Eyes:  EOMI, normal lids, iris ENT:  grossly normal hearing, lips & tongue, mmm; edentulous Neck:  no LAD, masses or thyromegaly Cardiovascular:  RRR, no m/r/g. No LE edema.  Respiratory:   CTA bilaterally with no wheezes/rales/rhonchi.  Normal respiratory effort. Abdomen:  soft, NT, ND Skin:  no rash or induration seen on limited exam Musculoskeletal:  RUE in sling with drain in place, bandage on L BKA stump is C/D/I Psychiatric:  grossly normal mood and affect, speech fluent and appropriate, AOx3 Neurologic:  CN 2-12 grossly intact, moves all extremities in coordinated fashion other than RUE  Data Reviewed: I have reviewed the patient's lab results since admission.  Pertinent labs for today include:   Glucose 144 BUN 31/Creatinine 1.41/GFR >60, back to baseline CK 274 WBC 13.3 Hgb 8.7  UDS + BZD, opiates   Family Communication: None present  Disposition: Status is: Inpatient Remains inpatient appropriate because: ongoing coordination of care     Time spent: 50 minutes  Unresulted Labs (From admission, onward)     Start     Ordered   08/30/23 0500  CBC with Differential/Platelet  Tomorrow morning,   R       Question:  Specimen  collection method  Answer:  Lab=Lab collect   08/29/23 1510   08/30/23 0500  Basic metabolic panel  Tomorrow morning,   R       Question:  Specimen collection method  Answer:  Lab=Lab collect   08/29/23 1510   08/28/23 0500  CK  Weekly,   TIMED     Question:  Specimen collection method  Answer:  Lab=Lab collect   08/27/23 1130             Author: Jonah Blue, MD 08/29/2023 3:14 PM  For on call review [email or web address].

## 2023-08-29 NOTE — Progress Notes (Signed)
Progress Note    08/29/2023 4:06 PM 2 Days Post-Op  Subjective:   Richard Mendoza is a 47 yo male  with medical history significant of uncontrolled type 2 diabetes, tobacco abuse, hypertension, hyperlipidemia, history of BKA presenting with diabetic stump infection. Patient reports worsening drainage at the base of his stump and presented to [facility] emergency department.  After evaluated it was determined that he possibly had tibial osteomyelitis and therefore vascular surgery recommends him undergoing an AKA.  Patient at that time refused surgery would like to treat with antibiotics.  It was also noted on scan that the patient had possible septic arthritis of the knee.  Orthopedics saw the patient and drained the fluid collection in his knee.  At this time he had also complained of right shoulder pain.  He was noted to have bursitis of the right shoulder and underwent I&D on 08/27/2023.  Patient endorses he will be going home possibly later today or tomorrow with 6 weeks of IV antibiotics.  He endorses that there is very slight drainage from his BKA at this point in time.  He states that this infection and drainage was most likely coming from his septic knee.  On exam patient appears to be healing as expected all of his surgical interventions.  In a discussion today he again outlined that he does not wish to have an above-the-knee amputation.  Vascular surgery is in agreement with this plan at this time to go home on 6 weeks of IV antibiotics.  We can see the patient back in clinic after he completes his antibiotics for further pain and any drainage to his stump.     Vitals:   08/29/23 0752 08/29/23 1509  BP: (!) 195/111 (!) 148/87  Pulse: 85 89  Resp: 20 17  Temp: 98.1 F (36.7 C) 98.4 F (36.9 C)  SpO2: 100% 100%   Physical Exam: Cardiac:  RRR, Normal S1, S2.  No murmurs appreciated. Lungs: Normal respiratory effort, clear on auscultation throughout.  No rales rhonchi or wheezing  noted Incisions: None Extremities: Left lower BKA with noted ulcer with decreasing drainage today about the size of a quarter.  Stump cellulitis appears to be better today. Abdomen: Positive bowel sounds throughout, soft, nontender and nondistended. Neurologic: Alert and oriented x 4, answers questions and follows commands appropriately.  CBC    Component Value Date/Time   WBC 13.3 (H) 08/29/2023 0509   RBC 3.13 (L) 08/29/2023 0509   HGB 8.7 (L) 08/29/2023 0509   HCT 26.7 (L) 08/29/2023 0509   PLT 392 08/29/2023 0509   MCV 85.3 08/29/2023 0509   MCH 27.8 08/29/2023 0509   MCHC 32.6 08/29/2023 0509   RDW 15.4 08/29/2023 0509   LYMPHSABS 2.1 08/29/2023 0509   MONOABS 0.5 08/29/2023 0509   EOSABS 0.2 08/29/2023 0509   BASOSABS 0.0 08/29/2023 0509    BMET    Component Value Date/Time   NA 136 08/29/2023 0509   K 4.1 08/29/2023 0509   CL 105 08/29/2023 0509   CO2 23 08/29/2023 0509   GLUCOSE 144 (H) 08/29/2023 0509   BUN 31 (H) 08/29/2023 0509   CREATININE 1.41 (H) 08/29/2023 0509   CALCIUM 8.1 (L) 08/29/2023 0509   GFRNONAA >60 08/29/2023 0509   GFRAA >60 05/05/2020 1930    INR    Component Value Date/Time   INR 1.0 06/01/2023 1535     Intake/Output Summary (Last 24 hours) at 08/29/2023 1606 Last data filed at 08/29/2023 0742 Gross per 24 hour  Intake 1649.97 ml  Output 1535 ml  Net 114.97 ml     Assessment/Plan:  47 y.o. male is s/p left knee tap all and right shoulder bursitis with I&D.  2 Days Post-Op   PLAN: Vascular surgery is okay with patient discharge at this time if medically appropriate.  Vascular surgery will see the patient back in clinic in 6 weeks post completion of IV antibiotic therapy.  Patient was instructed to call us to set up an appointment at that time.  DVT prophylaxis: Lovenox 40 mg SQ every 24 hours   Marcie Bal [facility] 08/29/2023 4:06 PM

## 2023-08-29 NOTE — TOC Progression Note (Addendum)
Transition of Care [facility]) - Progression Note    Patient Details  Name: Richard Mendoza MRN: [id number] Date of Birth: 1976-06-19  Transition of Care [facility]) CM/SW Contact  Margarito Liner, LCSW Phone Number: 08/29/2023, 12:35 PM  Clinical Narrative:  Patient is agreeable to outpatient PT once cleared by ortho and DME recommendation for 3-in-1. He prefers [employer]. TOC supervisor sent the order to their office. CSW asked MD to cosign 3-in-1 narrative and enter DME order.   1:07 pm: CSW ordered 3-in-1 through [employer].  3:33 pm: Patient confirmed his IV abx have already been delivered to the home and are in his refrigerator. Will plan on discharge tomorrow. [employer] liaison is aware.  Expected Discharge Plan: Home/Self Care Barriers to Discharge: Continued Medical Work up  Expected Discharge Plan and Services     Post Acute Care Choice: NA Living arrangements for the past 2 months: Single Family Home                                       Social Determinants of Health (SDOH) Interventions SDOH Screenings   Food Insecurity: No Food Insecurity (06/01/2023)  Housing: High Risk (06/01/2023)  Transportation Needs: No Transportation Needs (06/01/2023)  Utilities: Not At Risk (06/01/2023)  Depression (PHQ2-9): Low Risk  (09/21/2019)  Financial Resource Strain: Low Risk  (07/24/2022)   Received from [facility], [facility]  Social Connections: Unknown (03/21/2022)   Received from [facility], [facility]  Stress: No Stress Concern Present (07/24/2022)   Received from [facility], [facility]  Tobacco Use: High Risk (08/27/2023)    Readmission Risk Interventions    08/21/2023    9:03 AM  Readmission Risk Prevention Plan  Transportation Screening Complete  PCP or Specialist Appt within 3-5 Days Complete  Social Work Consult for Recovery Care Planning/Counseling Complete  Palliative Care Screening Not Applicable  Medication Review [employer]) Complete

## 2023-08-29 NOTE — TOC CM/SW Note (Signed)
Patient is not able to walk the distance required to go the bathroom, or he/she is unable to safely negotiate stairs required to access the bathroom.  A 3in1 BSC will alleviate this problem  

## 2023-08-29 NOTE — Evaluation (Signed)
Physical Therapy Evaluation & Discharge  Patient Details Name: Richard Mendoza MRN: [id number] DOB: 1975/11/29 Today's Date: 08/29/2023  History of Present Illness  47 y/o male presented to ED on 08/20/23 for L BKA drainage. MRI suspicious for osteomyelitis of L tibia and abscess. Patient opting to treat conservatively with IV antibiotics. Patient reports fall prior to admission resulting in R shoulder pain and inability to lift arm above head. MRI R shoulder found substantial joint effusion in rotator interval and subacromial subdeltoid bursa. S/p R shoulder I&D on 10/8. PMH: hx of necrotizing fascitis s/p L BKA, CKD 3A, T1DM, HTN, depression, anxiety, neuropathy, chronic pain syndrome.  Clinical Impression  Patient admitted with the above. PTA, patient lives with wife and other family members and reports independence with use of Rw and prosthetic leg on L. Currently, patient limited by inability to use L prosthetic due to abscess and R shoulder NWB and use of sling. Educated patient on limited use of R UE and L LE and impact on mobility. Able to complete bed mobility and squat pivot transfer to/from chair independently without use of RUE. Patient feels confident in his ability to perform transfers and mobilize within his home. No further skilled PT needs identified acutely. Recommend OPPT at discharge for R UE strength and ROM.         If plan is discharge home, recommend the following: Help with stairs or ramp for entrance   Can travel by private vehicle        Equipment Recommendations BSC/3in1  Recommendations for Other Services       Functional Status Assessment Patient has had a recent decline in their functional status and demonstrates the ability to make significant improvements in function in a reasonable and predictable amount of time.     Precautions / Restrictions Precautions Precautions: None Required Braces or Orthoses: Sling Restrictions Weight Bearing Restrictions:  Yes RUE Weight Bearing: Non weight bearing (per MD, can bear weight for limited periods of time with use of RW)      Mobility  Bed Mobility Overal bed mobility: Independent                  Transfers Overall transfer level: Independent                 General transfer comment: able to complete squat pivot transfer with no difficulty    Ambulation/Gait                  Stairs            Wheelchair Mobility     Tilt Bed    Modified Rankin (Stroke Patients Only)       Balance Overall balance assessment: Independent                                           Pertinent Vitals/Pain Pain Assessment Pain Assessment: Faces Faces Pain Scale: Hurts little more Pain Location: R shoulder Pain Descriptors / Indicators: Sore Pain Intervention(s): Monitored during session    Home Living Family/patient expects to be discharged to:: Private residence Living Arrangements: Spouse/significant other;Other relatives Available Help at Discharge: Family;Available 24 hours/day Type of Home: House Home Access: Ramped entrance       Home Layout: One level Home Equipment: Agricultural consultant (2 wheels);Wheelchair - manual      Prior Function Prior Level of Function : Independent/Modified  Independent                     Extremity/Trunk Assessment   Upper Extremity Assessment Upper Extremity Assessment: RUE deficits/detail RUE Deficits / Details: limited ability to assess due to immobilization    Lower Extremity Assessment Lower Extremity Assessment: LLE deficits/detail LLE Deficits / Details: hx of L BKA - unable to use prosthetic at this time due to abscess    Cervical / Trunk Assessment Cervical / Trunk Assessment: Normal  Communication   Communication Communication: No apparent difficulties  Cognition Arousal: Alert Behavior During Therapy: WFL for tasks assessed/performed Overall Cognitive Status: Within Functional  Limits for tasks assessed                                          General Comments      Exercises     Assessment/Plan    PT Assessment Patient does not need any further PT services  PT Problem List         PT Treatment Interventions      PT Goals (Current goals can be found in the Care Plan section)  Acute Rehab PT Goals Patient Stated Goal: to figure out when I can use this arm PT Goal Formulation: All assessment and education complete, DC therapy Potential to Achieve Goals: Good    Frequency       Co-evaluation               AM-PAC PT "6 Clicks" Mobility  Outcome Measure Help needed turning from your back to your side while in a flat bed without using bedrails?: None Help needed moving from lying on your back to sitting on the side of a flat bed without using bedrails?: None Help needed moving to and from a bed to a chair (including a wheelchair)?: None Help needed standing up from a chair using your arms (e.g., wheelchair or bedside chair)?: None Help needed to walk in hospital room?: A Lot Help needed climbing 3-5 steps with a railing? : A Lot 6 Click Score: 20    End of Session   Activity Tolerance: Patient tolerated treatment well Patient left: in bed;with call bell/phone within reach Nurse Communication: Mobility status PT Visit Diagnosis: Muscle weakness (generalized) (M62.81)    Time: 5284-1324 PT Time Calculation (min) (ACUTE ONLY): 16 min   Charges:   PT Evaluation $PT Eval Low Complexity: 1 Low   PT General Charges $$ ACUTE PT VISIT: 1 Visit         Maylon Peppers, PT, DPT Physical Therapist - [facility]  [facility]   Damesha Lawler A Kody Brandl 08/29/2023, 10:31 AM

## 2023-08-29 NOTE — Progress Notes (Signed)
  Subjective: 2 Days Post-Op Procedure(s) (LRB): SHOULDER ARTHROSCOPY WITH INCISION AND DRAINAGE (Right) Patient reports pain as mild.  Nerve block wearing off.   Patient is well, and has had no acute complaints or problems Plan is to go Home after hospital stay. Negative for chest pain and shortness of breath Fever: no Gastrointestinal:Negative for nausea and vomiting  Objective: Vital signs in last 24 hours: Temp:  [97.9 F (36.6 C)-98.2 F (36.8 C)] 97.9 F (36.6 C) (10/10 0410) Pulse Rate:  [75-86] 75 (10/10 0410) Resp:  [16-20] 20 (10/10 0410) BP: (156-181)/(83-105) 177/87 (10/10 0410) SpO2:  [99 %-100 %] 100 % (10/10 0410)  Intake/Output from previous day:  Intake/Output Summary (Last 24 hours) at 08/29/2023 0720 Last data filed at 08/29/2023 0521 Gross per 24 hour  Intake 2369.97 ml  Output 2265 ml  Net 104.97 ml    Intake/Output this shift: No intake/output data recorded.  Labs: Recent Labs    08/28/23 0330 08/29/23 0509  HGB 8.9* 8.7*   Recent Labs    08/28/23 0330 08/29/23 0509  WBC 15.0* 13.3*  RBC 3.26* 3.13*  HCT 26.6* 26.7*  PLT 375 392   Recent Labs    08/28/23 0330 08/29/23 0509  NA 128* 136  K 5.2* 4.1  CL 98 105  CO2 22 23  BUN 36* 31*  CREATININE 1.90* 1.41*  GLUCOSE 469* 144*  CALCIUM 8.1* 8.1*   No results for input(s): "LABPT", "INR" in the last 72 hours.   EXAM General - Patient is Alert and Oriented Extremity - Neurovascular intact Sensation intact distally Dorsiflexion/Plantar flexion intact Dressing/Incision - clean, dry, with hemovac removed with no complication Motor Function - intact, moving fingers and wrist well on exam.   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    Assessment/Plan: 2 Days Post-Op Procedure(s) (LRB): SHOULDER ARTHROSCOPY WITH  INCISION AND DRAINAGE (Right) Principal Problem:   Amputation stump infection (HCC) Active Problems:   Anemia of chronic disease   Osteomyelitis (HCC)   Acute kidney injury superimposed on chronic kidney disease (HCC)   Hyperglycemia due to type 2 diabetes mellitus (HCC)   Gastro-esophageal reflux disease without esophagitis   Cellulitis of left lower extremity   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   Weight as of this encounter: 88.6 kg. Advance diet Up with therapy  Continue Abx therapy.  Awaiting culture results  Discharge plan:  Plan to follow up in 2 weeks for suture removal at [facility].    DVT Prophylaxis - Aspirin Sling to be used for 2 weeks  Dedra Skeens, PA-C Orthopaedic Surgery 08/29/2023, 7:20 AM

## 2023-08-29 NOTE — Progress Notes (Signed)
ID Drain removed from rt shoulder He is doing better Says pain rt shoulder better  Comfortable Rt arm in sling BP (!) 148/87 (BP Location: Right Leg)   Pulse 89   Temp 98.4 F (36.9 C) (Oral)   Resp 17   Ht 5\' 10"  (1.778 m)   Wt 88.6 kg   SpO2 100%   BMI 28.04 kg/m   Chest b/l air entry [id number] Left BKA   Labs    Latest Ref Rng & Units 08/29/2023    5:09 AM 08/28/2023    3:30 AM 08/25/2023    3:34 AM  CBC  WBC 4.0 - 10.5 K/uL 13.3  15.0  10.8   Hemoglobin 13.0 - 17.0 g/dL 8.7  8.9  9.0   Hematocrit 39.0 - 52.0 % 26.7  26.6  26.5   Platelets 150 - 400 K/uL 392  375  307        Latest Ref Rng & Units 08/29/2023    5:09 AM 08/28/2023    3:30 AM 08/25/2023    3:34 AM  CMP  Glucose 70 - 99 mg/dL 191  478  295   BUN 6 - 20 mg/dL 31  36  29   Creatinine 0.61 - 1.24 mg/dL 6.21  3.08  6.57   Sodium 135 - 145 mmol/L 136  128  134   Potassium 3.5 - 5.1 mmol/L 4.1  5.2  3.8   Chloride 98 - 111 mmol/L 105  98  102   CO2 22 - 32 mmol/L 23  22  22    Calcium 8.9 - 10.3 mg/dL 8.1  8.1  8.2      Impression/recommendation staph aureus bacteremia BCID mentioned MEC A gene But culture ID MSSA Asked lab to reconfirm- pending Currently on Daptomycin IF MSSA is confirmed can change to cefazolin   Source left BKA site infection- MSSA- but different susceptibility On Daptomycin   Left BKA stump infection with osteomyelitis - seen by vascular- currently no plans for surgical intervention He will need 6 weeks of IV antibiotic -    Aspiration of left knee ( 2113 wbc) Culture NG   Anemia   DM   HTN   AKI on CKDimproving Hypokalemia improving   Rt should pain and restricted mobility-  septic arthritis   Underwent washout and culture so far neg  Discussed the management with patient  in great detail OPAT order in place

## 2023-08-30 ENCOUNTER — Other Ambulatory Visit: Payer: Self-pay

## 2023-08-30 DIAGNOSIS — T8740 Infection of amputation stump, unspecified extremity: Secondary | ICD-10-CM | POA: Diagnosis not present

## 2023-08-30 LAB — CBC WITH DIFFERENTIAL/PLATELET
Abs Immature Granulocytes: 0.16 10*3/uL — ABNORMAL HIGH (ref 0.00–0.07)
Basophils Absolute: 0 10*3/uL (ref 0.0–0.1)
Basophils Relative: 0 %
Eosinophils Absolute: 0.2 10*3/uL (ref 0.0–0.5)
Eosinophils Relative: 1 %
HCT: 28.9 % — ABNORMAL LOW (ref 39.0–52.0)
Hemoglobin: 9.2 g/dL — ABNORMAL LOW (ref 13.0–17.0)
Immature Granulocytes: 1 %
Lymphocytes Relative: 13 %
Lymphs Abs: 1.9 10*3/uL (ref 0.7–4.0)
MCH: 27.1 pg (ref 26.0–34.0)
MCHC: 31.8 g/dL (ref 30.0–36.0)
MCV: 85.3 fL (ref 80.0–100.0)
Monocytes Absolute: 0.7 10*3/uL (ref 0.1–1.0)
Monocytes Relative: 4 %
Neutro Abs: 12.1 10*3/uL — ABNORMAL HIGH (ref 1.7–7.7)
Neutrophils Relative %: 81 %
Platelets: 470 10*3/uL — ABNORMAL HIGH (ref 150–400)
RBC: 3.39 MIL/uL — ABNORMAL LOW (ref 4.22–5.81)
RDW: 15.6 % — ABNORMAL HIGH (ref 11.5–15.5)
WBC: 15.1 10*3/uL — ABNORMAL HIGH (ref 4.0–10.5)
nRBC: 0 % (ref 0.0–0.2)

## 2023-08-30 LAB — BASIC METABOLIC PANEL
Anion gap: 9 (ref 5–15)
BUN: 31 mg/dL — ABNORMAL HIGH (ref 6–20)
CO2: 22 mmol/L (ref 22–32)
Calcium: 8.5 mg/dL — ABNORMAL LOW (ref 8.9–10.3)
Chloride: 107 mmol/L (ref 98–111)
Creatinine, Ser: 1.42 mg/dL — ABNORMAL HIGH (ref 0.61–1.24)
GFR, Estimated: >60 mL/min (ref >60)
Glucose, Bld: 79 mg/dL (ref 70–99)
Potassium: 4.4 mmol/L (ref 3.5–5.1)
Sodium: 138 mmol/L (ref 135–145)

## 2023-08-30 LAB — BODY FLUID CULTURE W GRAM STAIN: Culture: NO GROWTH

## 2023-08-30 LAB — GLUCOSE, CAPILLARY
Glucose-Capillary: 105 mg/dL — ABNORMAL HIGH (ref 70–99)
Glucose-Capillary: 66 mg/dL — ABNORMAL LOW (ref 70–99)
Glucose-Capillary: 97 mg/dL (ref 70–99)
Glucose-Capillary: 118 mg/dL — ABNORMAL HIGH (ref 70–99)

## 2023-08-30 MED ORDER — ASPIRIN 81 MG PO TBEC
81.0000 mg | DELAYED_RELEASE_TABLET | Freq: Every day | ORAL | Status: AC
Start: 2023-08-30 — End: 2023-09-29

## 2023-08-30 MED ORDER — HYDROCODONE-ACETAMINOPHEN 10-325 MG PO TABS
1.0000 | ORAL_TABLET | Freq: Four times a day (QID) | ORAL | 0 refills | Status: DC | PRN
Start: 2023-08-30 — End: 2023-09-24
  Filled 2023-08-30: qty 20, 5d supply, fill #0

## 2023-08-30 MED ORDER — BENAZEPRIL HCL 20 MG PO TABS
20.0000 mg | ORAL_TABLET | Freq: Every day | ORAL | 1 refills | Status: DC
  Filled 2023-08-30: qty 30, 30d supply, fill #0

## 2023-08-30 NOTE — Plan of Care (Signed)
[facility] plan of care. Patient adequate for discharge.  Richard Mendoza

## 2023-08-30 NOTE — Progress Notes (Signed)
Discharge instructions given to the patient. Patient sent out via  wheelchair

## 2023-08-30 NOTE — Progress Notes (Signed)
Hypoglycemic Event  CBG: 66  Treatment: ate breakfast  Symptoms: None  Follow-up CBG: Time:0856 CBG Result:97  Possible Reasons for Event: Other: had not eaten breakfast yet  Comments/MD notified:n/a    Retia Passe

## 2023-08-30 NOTE — Discharge Summary (Addendum)
Physician Discharge Summary   Patient: Richard Mendoza MRN: [id number] DOB: [birth year]  Admit date:     08/20/2023  Discharge date: 08/30/23  Discharge Physician: Jonah Blue   PCP: Barbette Reichmann, MD   Recommendations at discharge:   You are being discharged with home health for IV antibiotics through 11/12 Call to arrange outpatient physical therapy at [facility] Follow up with Dr. Marcello Fennel next week Follow up with Dr. Allena Katz (orthopedics) in 2 weeks for recheck and suture removal Follow up with Dr. Rivka Safer (ID) in 3 weeks Follow up with Dr. Wyn Quaker (vascular surgery) in 6 weeks after completion of antibiotic therapy STOP smoking!  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)   Tobacco abuse   Anemia of chronic disease   Osteomyelitis (HCC)   Acute kidney injury superimposed on chronic kidney disease (HCC)   Hyperglycemia due to type 2 diabetes mellitus (HCC)   Gastro-esophageal reflux disease without esophagitis   Cellulitis of left lower extremity   Overweight (BMI 25.0-29.9)   MRSA bacteremia   Iron deficiency anemia, unspecified   Acute bursitis of right shoulder   Staphylococcal arthritis of right shoulder (HCC)   Bacteremia due to Staphylococcus aureus    Hospital Course: 47yo with medical history significant of uncontrolled type 2 diabetes, tobacco abuse, hypertension, hyperlipidemia, and history of BKA who presented on 10/1 with diabetic stump infection.  Patient is seen by vascular surgery, started on broad-spectrum antibiotics with cefepime, Flagyl and vancomycin.  MRI of the left lower extremity showed soft tissue abscess, tibial osteomyelitis, knee joint effusion. Blood culture 1 set positive for Staph aureus, epidermidis and strep agalactiae.  Patient seen by ID, treated with vancomycin then changed to daptomycin.  Patient also found to have significant  bursitis in the right shoulder MRI, joint aspiration consistent with septic joint.  Joint washout on 10/8.  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  Wound culture grew Staph aureus and group B strep. Patient seen by orthopedics, knee tap showed increased WBC in the joint, but culture negative.  Followed by ID, antibiotic switched to daptomycin 10/3. Patient condition is stable, discussed with ID, patient be treated with daptomycin for 6 weeks, has PICC line.   Echocardiogram obtained to rule out valvular abnormality with possible endocarditis, no obvious valvular abnormality noted  Will not plan TEE at this time, as patient will be treated with antibiotics for 6 weeks, though 11/12 Will plan for dc with home health to assist for Daptomycin dosing Pain control with as needed Dilaudid and Norco   Right shoulder bursitis, Possible septic arthritis MRI shoulder showed bursitis with large amount of effusion Joint tap showed significant white cells Shoulder arthroscopy on 10/8, culture pending On daptomycin   Acute kidney injury on chronic kidney disease stage IIIa Baseline creatinine 1.58, GFR 54 on 7/14 Creatinine 2.89 on admission with GFR 26, new presumed hypovolemic hyponatremia This appears to have normalized and is back to his baseline   Uncontrolled type 2 diabetes mellitus  A1c 13.2, very poor control He was given steroids in the OR yesterday and had elevated glucose Insulin pump was stopped and he was  L BKA stump is C/D/I Psychiatric:  grossly normal mood and affect, speech fluent and appropriate, AOx3 Neurologic:  CN 2-12 grossly intact, moves all extremities in coordinated fashion other than RUE  Data Reviewed: I have reviewed the patient's lab results since admission.  Pertinent labs for today include:   BUN 31/Creatinine 1.42/GFR >60 - stable WBC 15.1 Hgb 9.2 Platelets 470    Condition at discharge: stable  The results of significant diagnostics from this hospitalization (including imaging, microbiology, ancillary and laboratory) are listed below for reference.   Imaging Studies: ECHOCARDIOGRAM COMPLETE  Result Date: 08/28/2023    ECHOCARDIOGRAM REPORT   Patient Name:   Richard Mendoza Date of Exam: 08/28/2023 Medical Rec #:  [id number]      Height:       70.0 in Accession #:    [id number]     Weight:       193.3 lb Date of Birth:  [birth year]      BSA:          2.058 m Patient Age:    47 years       BP:           188/96 mmHg Patient Gender: M              HR:           73 bpm. Exam Location:  [facility] Procedure: 2D Echo, Cardiac Doppler and Color Doppler Indications:     Endocarditis I38  History:         Patient has prior history of Echocardiogram examinations, most                  recent 04/04/2021. Risk Factors:Hypertension and Diabetes.  Sonographer:     Cristela Blue Referring Phys:  [id number] Marrion Coy Diagnosing Phys: Yvonne Kendall MD IMPRESSIONS  1. Left ventricular ejection fraction, by estimation, is 55 to 60%. The left ventricle has normal function. The left ventricle has no regional wall motion abnormalities. There is mild left ventricular hypertrophy. Left ventricular diastolic parameters are consistent with Grade I diastolic dysfunction (impaired relaxation).  2. Right ventricular systolic function is normal. The right ventricular size is normal. Tricuspid regurgitation signal is inadequate for assessing PA pressure.  3. The mitral valve is normal in structure. Trivial mitral valve regurgitation. No evidence of mitral stenosis.  4. The aortic valve is tricuspid. Aortic valve regurgitation is not visualized. No aortic stenosis is present.  5. The inferior vena cava is normal in size with <50% respiratory variability, suggesting right atrial pressure of 8 mmHg. FINDINGS  Left Ventricle: Left ventricular ejection fraction, by estimation, is 55 to 60%. The left ventricle has normal function. The left ventricle has no regional wall motion abnormalities. The left ventricular internal cavity size was normal in size. There is  mild left ventricular hypertrophy. Left ventricular diastolic parameters are consistent with Grade I diastolic dysfunction (impaired relaxation). Right Ventricle: The right ventricular size is normal. No increase in right ventricular wall thickness. Right ventricular systolic function is normal. Tricuspid regurgitation signal is inadequate for assessing PA pressure. Left Atrium: Left atrial size was normal in size. Right Atrium: Right atrial size was normal in size. Pericardium: There is no evidence of pericardial effusion. Mitral Valve: The mitral valve is normal in structure. Trivial mitral valve regurgitation. No evidence of mitral valve stenosis. MV peak gradient, 3.7 mmHg. The mean mitral valve gradient is 2.0 mmHg. Tricuspid Valve: The tricuspid valve is normal in structure.  Tricuspid valve regurgitation  Physician Discharge Summary   Patient: Richard Mendoza MRN: [id number] DOB: [birth year]  Admit date:     08/20/2023  Discharge date: 08/30/23  Discharge Physician: Jonah Blue   PCP: Barbette Reichmann, MD   Recommendations at discharge:   You are being discharged with home health for IV antibiotics through 11/12 Call to arrange outpatient physical therapy at [facility] Follow up with Dr. Marcello Fennel next week Follow up with Dr. Allena Katz (orthopedics) in 2 weeks for recheck and suture removal Follow up with Dr. Rivka Safer (ID) in 3 weeks Follow up with Dr. Wyn Quaker (vascular surgery) in 6 weeks after completion of antibiotic therapy STOP smoking!  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)   Tobacco abuse   Anemia of chronic disease   Osteomyelitis (HCC)   Acute kidney injury superimposed on chronic kidney disease (HCC)   Hyperglycemia due to type 2 diabetes mellitus (HCC)   Gastro-esophageal reflux disease without esophagitis   Cellulitis of left lower extremity   Overweight (BMI 25.0-29.9)   MRSA bacteremia   Iron deficiency anemia, unspecified   Acute bursitis of right shoulder   Staphylococcal arthritis of right shoulder (HCC)   Bacteremia due to Staphylococcus aureus    Hospital Course: 47yo with medical history significant of uncontrolled type 2 diabetes, tobacco abuse, hypertension, hyperlipidemia, and history of BKA who presented on 10/1 with diabetic stump infection.  Patient is seen by vascular surgery, started on broad-spectrum antibiotics with cefepime, Flagyl and vancomycin.  MRI of the left lower extremity showed soft tissue abscess, tibial osteomyelitis, knee joint effusion. Blood culture 1 set positive for Staph aureus, epidermidis and strep agalactiae.  Patient seen by ID, treated with vancomycin then changed to daptomycin.  Patient also found to have significant  bursitis in the right shoulder MRI, joint aspiration consistent with septic joint.  Joint washout on 10/8.  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  Wound culture grew Staph aureus and group B strep. Patient seen by orthopedics, knee tap showed increased WBC in the joint, but culture negative.  Followed by ID, antibiotic switched to daptomycin 10/3. Patient condition is stable, discussed with ID, patient be treated with daptomycin for 6 weeks, has PICC line.   Echocardiogram obtained to rule out valvular abnormality with possible endocarditis, no obvious valvular abnormality noted  Will not plan TEE at this time, as patient will be treated with antibiotics for 6 weeks, though 11/12 Will plan for dc with home health to assist for Daptomycin dosing Pain control with as needed Dilaudid and Norco   Right shoulder bursitis, Possible septic arthritis MRI shoulder showed bursitis with large amount of effusion Joint tap showed significant white cells Shoulder arthroscopy on 10/8, culture pending On daptomycin   Acute kidney injury on chronic kidney disease stage IIIa Baseline creatinine 1.58, GFR 54 on 7/14 Creatinine 2.89 on admission with GFR 26, new presumed hypovolemic hyponatremia This appears to have normalized and is back to his baseline   Uncontrolled type 2 diabetes mellitus  A1c 13.2, very poor control He was given steroids in the OR yesterday and had elevated glucose Insulin pump was stopped and he was  L BKA stump is C/D/I Psychiatric:  grossly normal mood and affect, speech fluent and appropriate, AOx3 Neurologic:  CN 2-12 grossly intact, moves all extremities in coordinated fashion other than RUE  Data Reviewed: I have reviewed the patient's lab results since admission.  Pertinent labs for today include:   BUN 31/Creatinine 1.42/GFR >60 - stable WBC 15.1 Hgb 9.2 Platelets 470    Condition at discharge: stable  The results of significant diagnostics from this hospitalization (including imaging, microbiology, ancillary and laboratory) are listed below for reference.   Imaging Studies: ECHOCARDIOGRAM COMPLETE  Result Date: 08/28/2023    ECHOCARDIOGRAM REPORT   Patient Name:   Richard Mendoza Date of Exam: 08/28/2023 Medical Rec #:  [id number]      Height:       70.0 in Accession #:    [id number]     Weight:       193.3 lb Date of Birth:  [birth year]      BSA:          2.058 m Patient Age:    47 years       BP:           188/96 mmHg Patient Gender: M              HR:           73 bpm. Exam Location:  [facility] Procedure: 2D Echo, Cardiac Doppler and Color Doppler Indications:     Endocarditis I38  History:         Patient has prior history of Echocardiogram examinations, most                  recent 04/04/2021. Risk Factors:Hypertension and Diabetes.  Sonographer:     Cristela Blue Referring Phys:  [id number] Marrion Coy Diagnosing Phys: Yvonne Kendall MD IMPRESSIONS  1. Left ventricular ejection fraction, by estimation, is 55 to 60%. The left ventricle has normal function. The left ventricle has no regional wall motion abnormalities. There is mild left ventricular hypertrophy. Left ventricular diastolic parameters are consistent with Grade I diastolic dysfunction (impaired relaxation).  2. Right ventricular systolic function is normal. The right ventricular size is normal. Tricuspid regurgitation signal is inadequate for assessing PA pressure.  3. The mitral valve is normal in structure. Trivial mitral valve regurgitation. No evidence of mitral stenosis.  4. The aortic valve is tricuspid. Aortic valve regurgitation is not visualized. No aortic stenosis is present.  5. The inferior vena cava is normal in size with <50% respiratory variability, suggesting right atrial pressure of 8 mmHg. FINDINGS  Left Ventricle: Left ventricular ejection fraction, by estimation, is 55 to 60%. The left ventricle has normal function. The left ventricle has no regional wall motion abnormalities. The left ventricular internal cavity size was normal in size. There is  mild left ventricular hypertrophy. Left ventricular diastolic parameters are consistent with Grade I diastolic dysfunction (impaired relaxation). Right Ventricle: The right ventricular size is normal. No increase in right ventricular wall thickness. Right ventricular systolic function is normal. Tricuspid regurgitation signal is inadequate for assessing PA pressure. Left Atrium: Left atrial size was normal in size. Right Atrium: Right atrial size was normal in size. Pericardium: There is no evidence of pericardial effusion. Mitral Valve: The mitral valve is normal in structure. Trivial mitral valve regurgitation. No evidence of mitral valve stenosis. MV peak gradient, 3.7 mmHg. The mean mitral valve gradient is 2.0 mmHg. Tricuspid Valve: The tricuspid valve is normal in structure.  Tricuspid valve regurgitation  Physician Discharge Summary   Patient: Richard Mendoza MRN: [id number] DOB: [birth year]  Admit date:     08/20/2023  Discharge date: 08/30/23  Discharge Physician: Jonah Blue   PCP: Barbette Reichmann, MD   Recommendations at discharge:   You are being discharged with home health for IV antibiotics through 11/12 Call to arrange outpatient physical therapy at [facility] Follow up with Dr. Marcello Fennel next week Follow up with Dr. Allena Katz (orthopedics) in 2 weeks for recheck and suture removal Follow up with Dr. Rivka Safer (ID) in 3 weeks Follow up with Dr. Wyn Quaker (vascular surgery) in 6 weeks after completion of antibiotic therapy STOP smoking!  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)   Tobacco abuse   Anemia of chronic disease   Osteomyelitis (HCC)   Acute kidney injury superimposed on chronic kidney disease (HCC)   Hyperglycemia due to type 2 diabetes mellitus (HCC)   Gastro-esophageal reflux disease without esophagitis   Cellulitis of left lower extremity   Overweight (BMI 25.0-29.9)   MRSA bacteremia   Iron deficiency anemia, unspecified   Acute bursitis of right shoulder   Staphylococcal arthritis of right shoulder (HCC)   Bacteremia due to Staphylococcus aureus    Hospital Course: 47yo with medical history significant of uncontrolled type 2 diabetes, tobacco abuse, hypertension, hyperlipidemia, and history of BKA who presented on 10/1 with diabetic stump infection.  Patient is seen by vascular surgery, started on broad-spectrum antibiotics with cefepime, Flagyl and vancomycin.  MRI of the left lower extremity showed soft tissue abscess, tibial osteomyelitis, knee joint effusion. Blood culture 1 set positive for Staph aureus, epidermidis and strep agalactiae.  Patient seen by ID, treated with vancomycin then changed to daptomycin.  Patient also found to have significant  bursitis in the right shoulder MRI, joint aspiration consistent with septic joint.  Joint washout on 10/8.  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  Wound culture grew Staph aureus and group B strep. Patient seen by orthopedics, knee tap showed increased WBC in the joint, but culture negative.  Followed by ID, antibiotic switched to daptomycin 10/3. Patient condition is stable, discussed with ID, patient be treated with daptomycin for 6 weeks, has PICC line.   Echocardiogram obtained to rule out valvular abnormality with possible endocarditis, no obvious valvular abnormality noted  Will not plan TEE at this time, as patient will be treated with antibiotics for 6 weeks, though 11/12 Will plan for dc with home health to assist for Daptomycin dosing Pain control with as needed Dilaudid and Norco   Right shoulder bursitis, Possible septic arthritis MRI shoulder showed bursitis with large amount of effusion Joint tap showed significant white cells Shoulder arthroscopy on 10/8, culture pending On daptomycin   Acute kidney injury on chronic kidney disease stage IIIa Baseline creatinine 1.58, GFR 54 on 7/14 Creatinine 2.89 on admission with GFR 26, new presumed hypovolemic hyponatremia This appears to have normalized and is back to his baseline   Uncontrolled type 2 diabetes mellitus  A1c 13.2, very poor control He was given steroids in the OR yesterday and had elevated glucose Insulin pump was stopped and he was  Physician Discharge Summary   Patient: Richard Mendoza MRN: [id number] DOB: [birth year]  Admit date:     08/20/2023  Discharge date: 08/30/23  Discharge Physician: Jonah Blue   PCP: Barbette Reichmann, MD   Recommendations at discharge:   You are being discharged with home health for IV antibiotics through 11/12 Call to arrange outpatient physical therapy at [facility] Follow up with Dr. Marcello Fennel next week Follow up with Dr. Allena Katz (orthopedics) in 2 weeks for recheck and suture removal Follow up with Dr. Rivka Safer (ID) in 3 weeks Follow up with Dr. Wyn Quaker (vascular surgery) in 6 weeks after completion of antibiotic therapy STOP smoking!  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)   Tobacco abuse   Anemia of chronic disease   Osteomyelitis (HCC)   Acute kidney injury superimposed on chronic kidney disease (HCC)   Hyperglycemia due to type 2 diabetes mellitus (HCC)   Gastro-esophageal reflux disease without esophagitis   Cellulitis of left lower extremity   Overweight (BMI 25.0-29.9)   MRSA bacteremia   Iron deficiency anemia, unspecified   Acute bursitis of right shoulder   Staphylococcal arthritis of right shoulder (HCC)   Bacteremia due to Staphylococcus aureus    Hospital Course: 47yo with medical history significant of uncontrolled type 2 diabetes, tobacco abuse, hypertension, hyperlipidemia, and history of BKA who presented on 10/1 with diabetic stump infection.  Patient is seen by vascular surgery, started on broad-spectrum antibiotics with cefepime, Flagyl and vancomycin.  MRI of the left lower extremity showed soft tissue abscess, tibial osteomyelitis, knee joint effusion. Blood culture 1 set positive for Staph aureus, epidermidis and strep agalactiae.  Patient seen by ID, treated with vancomycin then changed to daptomycin.  Patient also found to have significant  bursitis in the right shoulder MRI, joint aspiration consistent with septic joint.  Joint washout on 10/8.  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  Wound culture grew Staph aureus and group B strep. Patient seen by orthopedics, knee tap showed increased WBC in the joint, but culture negative.  Followed by ID, antibiotic switched to daptomycin 10/3. Patient condition is stable, discussed with ID, patient be treated with daptomycin for 6 weeks, has PICC line.   Echocardiogram obtained to rule out valvular abnormality with possible endocarditis, no obvious valvular abnormality noted  Will not plan TEE at this time, as patient will be treated with antibiotics for 6 weeks, though 11/12 Will plan for dc with home health to assist for Daptomycin dosing Pain control with as needed Dilaudid and Norco   Right shoulder bursitis, Possible septic arthritis MRI shoulder showed bursitis with large amount of effusion Joint tap showed significant white cells Shoulder arthroscopy on 10/8, culture pending On daptomycin   Acute kidney injury on chronic kidney disease stage IIIa Baseline creatinine 1.58, GFR 54 on 7/14 Creatinine 2.89 on admission with GFR 26, new presumed hypovolemic hyponatremia This appears to have normalized and is back to his baseline   Uncontrolled type 2 diabetes mellitus  A1c 13.2, very poor control He was given steroids in the OR yesterday and had elevated glucose Insulin pump was stopped and he was  Physician Discharge Summary   Patient: Richard Mendoza MRN: [id number] DOB: [birth year]  Admit date:     08/20/2023  Discharge date: 08/30/23  Discharge Physician: Jonah Blue   PCP: Barbette Reichmann, MD   Recommendations at discharge:   You are being discharged with home health for IV antibiotics through 11/12 Call to arrange outpatient physical therapy at [facility] Follow up with Dr. Marcello Fennel next week Follow up with Dr. Allena Katz (orthopedics) in 2 weeks for recheck and suture removal Follow up with Dr. Rivka Safer (ID) in 3 weeks Follow up with Dr. Wyn Quaker (vascular surgery) in 6 weeks after completion of antibiotic therapy STOP smoking!  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)   Tobacco abuse   Anemia of chronic disease   Osteomyelitis (HCC)   Acute kidney injury superimposed on chronic kidney disease (HCC)   Hyperglycemia due to type 2 diabetes mellitus (HCC)   Gastro-esophageal reflux disease without esophagitis   Cellulitis of left lower extremity   Overweight (BMI 25.0-29.9)   MRSA bacteremia   Iron deficiency anemia, unspecified   Acute bursitis of right shoulder   Staphylococcal arthritis of right shoulder (HCC)   Bacteremia due to Staphylococcus aureus    Hospital Course: 47yo with medical history significant of uncontrolled type 2 diabetes, tobacco abuse, hypertension, hyperlipidemia, and history of BKA who presented on 10/1 with diabetic stump infection.  Patient is seen by vascular surgery, started on broad-spectrum antibiotics with cefepime, Flagyl and vancomycin.  MRI of the left lower extremity showed soft tissue abscess, tibial osteomyelitis, knee joint effusion. Blood culture 1 set positive for Staph aureus, epidermidis and strep agalactiae.  Patient seen by ID, treated with vancomycin then changed to daptomycin.  Patient also found to have significant  bursitis in the right shoulder MRI, joint aspiration consistent with septic joint.  Joint washout on 10/8.  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  Wound culture grew Staph aureus and group B strep. Patient seen by orthopedics, knee tap showed increased WBC in the joint, but culture negative.  Followed by ID, antibiotic switched to daptomycin 10/3. Patient condition is stable, discussed with ID, patient be treated with daptomycin for 6 weeks, has PICC line.   Echocardiogram obtained to rule out valvular abnormality with possible endocarditis, no obvious valvular abnormality noted  Will not plan TEE at this time, as patient will be treated with antibiotics for 6 weeks, though 11/12 Will plan for dc with home health to assist for Daptomycin dosing Pain control with as needed Dilaudid and Norco   Right shoulder bursitis, Possible septic arthritis MRI shoulder showed bursitis with large amount of effusion Joint tap showed significant white cells Shoulder arthroscopy on 10/8, culture pending On daptomycin   Acute kidney injury on chronic kidney disease stage IIIa Baseline creatinine 1.58, GFR 54 on 7/14 Creatinine 2.89 on admission with GFR 26, new presumed hypovolemic hyponatremia This appears to have normalized and is back to his baseline   Uncontrolled type 2 diabetes mellitus  A1c 13.2, very poor control He was given steroids in the OR yesterday and had elevated glucose Insulin pump was stopped and he was  Physician Discharge Summary   Patient: Richard Mendoza MRN: [id number] DOB: [birth year]  Admit date:     08/20/2023  Discharge date: 08/30/23  Discharge Physician: Jonah Blue   PCP: Barbette Reichmann, MD   Recommendations at discharge:   You are being discharged with home health for IV antibiotics through 11/12 Call to arrange outpatient physical therapy at [facility] Follow up with Dr. Marcello Fennel next week Follow up with Dr. Allena Katz (orthopedics) in 2 weeks for recheck and suture removal Follow up with Dr. Rivka Safer (ID) in 3 weeks Follow up with Dr. Wyn Quaker (vascular surgery) in 6 weeks after completion of antibiotic therapy STOP smoking!  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)   Tobacco abuse   Anemia of chronic disease   Osteomyelitis (HCC)   Acute kidney injury superimposed on chronic kidney disease (HCC)   Hyperglycemia due to type 2 diabetes mellitus (HCC)   Gastro-esophageal reflux disease without esophagitis   Cellulitis of left lower extremity   Overweight (BMI 25.0-29.9)   MRSA bacteremia   Iron deficiency anemia, unspecified   Acute bursitis of right shoulder   Staphylococcal arthritis of right shoulder (HCC)   Bacteremia due to Staphylococcus aureus    Hospital Course: 47yo with medical history significant of uncontrolled type 2 diabetes, tobacco abuse, hypertension, hyperlipidemia, and history of BKA who presented on 10/1 with diabetic stump infection.  Patient is seen by vascular surgery, started on broad-spectrum antibiotics with cefepime, Flagyl and vancomycin.  MRI of the left lower extremity showed soft tissue abscess, tibial osteomyelitis, knee joint effusion. Blood culture 1 set positive for Staph aureus, epidermidis and strep agalactiae.  Patient seen by ID, treated with vancomycin then changed to daptomycin.  Patient also found to have significant  bursitis in the right shoulder MRI, joint aspiration consistent with septic joint.  Joint washout on 10/8.  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  Wound culture grew Staph aureus and group B strep. Patient seen by orthopedics, knee tap showed increased WBC in the joint, but culture negative.  Followed by ID, antibiotic switched to daptomycin 10/3. Patient condition is stable, discussed with ID, patient be treated with daptomycin for 6 weeks, has PICC line.   Echocardiogram obtained to rule out valvular abnormality with possible endocarditis, no obvious valvular abnormality noted  Will not plan TEE at this time, as patient will be treated with antibiotics for 6 weeks, though 11/12 Will plan for dc with home health to assist for Daptomycin dosing Pain control with as needed Dilaudid and Norco   Right shoulder bursitis, Possible septic arthritis MRI shoulder showed bursitis with large amount of effusion Joint tap showed significant white cells Shoulder arthroscopy on 10/8, culture pending On daptomycin   Acute kidney injury on chronic kidney disease stage IIIa Baseline creatinine 1.58, GFR 54 on 7/14 Creatinine 2.89 on admission with GFR 26, new presumed hypovolemic hyponatremia This appears to have normalized and is back to his baseline   Uncontrolled type 2 diabetes mellitus  A1c 13.2, very poor control He was given steroids in the OR yesterday and had elevated glucose Insulin pump was stopped and he was  L BKA stump is C/D/I Psychiatric:  grossly normal mood and affect, speech fluent and appropriate, AOx3 Neurologic:  CN 2-12 grossly intact, moves all extremities in coordinated fashion other than RUE  Data Reviewed: I have reviewed the patient's lab results since admission.  Pertinent labs for today include:   BUN 31/Creatinine 1.42/GFR >60 - stable WBC 15.1 Hgb 9.2 Platelets 470    Condition at discharge: stable  The results of significant diagnostics from this hospitalization (including imaging, microbiology, ancillary and laboratory) are listed below for reference.   Imaging Studies: ECHOCARDIOGRAM COMPLETE  Result Date: 08/28/2023    ECHOCARDIOGRAM REPORT   Patient Name:   Richard Mendoza Date of Exam: 08/28/2023 Medical Rec #:  [id number]      Height:       70.0 in Accession #:    [id number]     Weight:       193.3 lb Date of Birth:  [birth year]      BSA:          2.058 m Patient Age:    47 years       BP:           188/96 mmHg Patient Gender: M              HR:           73 bpm. Exam Location:  [facility] Procedure: 2D Echo, Cardiac Doppler and Color Doppler Indications:     Endocarditis I38  History:         Patient has prior history of Echocardiogram examinations, most                  recent 04/04/2021. Risk Factors:Hypertension and Diabetes.  Sonographer:     Cristela Blue Referring Phys:  [id number] Marrion Coy Diagnosing Phys: Yvonne Kendall MD IMPRESSIONS  1. Left ventricular ejection fraction, by estimation, is 55 to 60%. The left ventricle has normal function. The left ventricle has no regional wall motion abnormalities. There is mild left ventricular hypertrophy. Left ventricular diastolic parameters are consistent with Grade I diastolic dysfunction (impaired relaxation).  2. Right ventricular systolic function is normal. The right ventricular size is normal. Tricuspid regurgitation signal is inadequate for assessing PA pressure.  3. The mitral valve is normal in structure. Trivial mitral valve regurgitation. No evidence of mitral stenosis.  4. The aortic valve is tricuspid. Aortic valve regurgitation is not visualized. No aortic stenosis is present.  5. The inferior vena cava is normal in size with <50% respiratory variability, suggesting right atrial pressure of 8 mmHg. FINDINGS  Left Ventricle: Left ventricular ejection fraction, by estimation, is 55 to 60%. The left ventricle has normal function. The left ventricle has no regional wall motion abnormalities. The left ventricular internal cavity size was normal in size. There is  mild left ventricular hypertrophy. Left ventricular diastolic parameters are consistent with Grade I diastolic dysfunction (impaired relaxation). Right Ventricle: The right ventricular size is normal. No increase in right ventricular wall thickness. Right ventricular systolic function is normal. Tricuspid regurgitation signal is inadequate for assessing PA pressure. Left Atrium: Left atrial size was normal in size. Right Atrium: Right atrial size was normal in size. Pericardium: There is no evidence of pericardial effusion. Mitral Valve: The mitral valve is normal in structure. Trivial mitral valve regurgitation. No evidence of mitral valve stenosis. MV peak gradient, 3.7 mmHg. The mean mitral valve gradient is 2.0 mmHg. Tricuspid Valve: The tricuspid valve is normal in structure.  Tricuspid valve regurgitation  Physician Discharge Summary   Patient: Richard Mendoza MRN: [id number] DOB: [birth year]  Admit date:     08/20/2023  Discharge date: 08/30/23  Discharge Physician: Jonah Blue   PCP: Barbette Reichmann, MD   Recommendations at discharge:   You are being discharged with home health for IV antibiotics through 11/12 Call to arrange outpatient physical therapy at [facility] Follow up with Dr. Marcello Fennel next week Follow up with Dr. Allena Katz (orthopedics) in 2 weeks for recheck and suture removal Follow up with Dr. Rivka Safer (ID) in 3 weeks Follow up with Dr. Wyn Quaker (vascular surgery) in 6 weeks after completion of antibiotic therapy STOP smoking!  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)   Tobacco abuse   Anemia of chronic disease   Osteomyelitis (HCC)   Acute kidney injury superimposed on chronic kidney disease (HCC)   Hyperglycemia due to type 2 diabetes mellitus (HCC)   Gastro-esophageal reflux disease without esophagitis   Cellulitis of left lower extremity   Overweight (BMI 25.0-29.9)   MRSA bacteremia   Iron deficiency anemia, unspecified   Acute bursitis of right shoulder   Staphylococcal arthritis of right shoulder (HCC)   Bacteremia due to Staphylococcus aureus    Hospital Course: 47yo with medical history significant of uncontrolled type 2 diabetes, tobacco abuse, hypertension, hyperlipidemia, and history of BKA who presented on 10/1 with diabetic stump infection.  Patient is seen by vascular surgery, started on broad-spectrum antibiotics with cefepime, Flagyl and vancomycin.  MRI of the left lower extremity showed soft tissue abscess, tibial osteomyelitis, knee joint effusion. Blood culture 1 set positive for Staph aureus, epidermidis and strep agalactiae.  Patient seen by ID, treated with vancomycin then changed to daptomycin.  Patient also found to have significant  bursitis in the right shoulder MRI, joint aspiration consistent with septic joint.  Joint washout on 10/8.  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  Wound culture grew Staph aureus and group B strep. Patient seen by orthopedics, knee tap showed increased WBC in the joint, but culture negative.  Followed by ID, antibiotic switched to daptomycin 10/3. Patient condition is stable, discussed with ID, patient be treated with daptomycin for 6 weeks, has PICC line.   Echocardiogram obtained to rule out valvular abnormality with possible endocarditis, no obvious valvular abnormality noted  Will not plan TEE at this time, as patient will be treated with antibiotics for 6 weeks, though 11/12 Will plan for dc with home health to assist for Daptomycin dosing Pain control with as needed Dilaudid and Norco   Right shoulder bursitis, Possible septic arthritis MRI shoulder showed bursitis with large amount of effusion Joint tap showed significant white cells Shoulder arthroscopy on 10/8, culture pending On daptomycin   Acute kidney injury on chronic kidney disease stage IIIa Baseline creatinine 1.58, GFR 54 on 7/14 Creatinine 2.89 on admission with GFR 26, new presumed hypovolemic hyponatremia This appears to have normalized and is back to his baseline   Uncontrolled type 2 diabetes mellitus  A1c 13.2, very poor control He was given steroids in the OR yesterday and had elevated glucose Insulin pump was stopped and he was  Physician Discharge Summary   Patient: Richard Mendoza MRN: [id number] DOB: [birth year]  Admit date:     08/20/2023  Discharge date: 08/30/23  Discharge Physician: Jonah Blue   PCP: Barbette Reichmann, MD   Recommendations at discharge:   You are being discharged with home health for IV antibiotics through 11/12 Call to arrange outpatient physical therapy at [facility] Follow up with Dr. Marcello Fennel next week Follow up with Dr. Allena Katz (orthopedics) in 2 weeks for recheck and suture removal Follow up with Dr. Rivka Safer (ID) in 3 weeks Follow up with Dr. Wyn Quaker (vascular surgery) in 6 weeks after completion of antibiotic therapy STOP smoking!  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)   Tobacco abuse   Anemia of chronic disease   Osteomyelitis (HCC)   Acute kidney injury superimposed on chronic kidney disease (HCC)   Hyperglycemia due to type 2 diabetes mellitus (HCC)   Gastro-esophageal reflux disease without esophagitis   Cellulitis of left lower extremity   Overweight (BMI 25.0-29.9)   MRSA bacteremia   Iron deficiency anemia, unspecified   Acute bursitis of right shoulder   Staphylococcal arthritis of right shoulder (HCC)   Bacteremia due to Staphylococcus aureus    Hospital Course: 47yo with medical history significant of uncontrolled type 2 diabetes, tobacco abuse, hypertension, hyperlipidemia, and history of BKA who presented on 10/1 with diabetic stump infection.  Patient is seen by vascular surgery, started on broad-spectrum antibiotics with cefepime, Flagyl and vancomycin.  MRI of the left lower extremity showed soft tissue abscess, tibial osteomyelitis, knee joint effusion. Blood culture 1 set positive for Staph aureus, epidermidis and strep agalactiae.  Patient seen by ID, treated with vancomycin then changed to daptomycin.  Patient also found to have significant  bursitis in the right shoulder MRI, joint aspiration consistent with septic joint.  Joint washout on 10/8.  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  Wound culture grew Staph aureus and group B strep. Patient seen by orthopedics, knee tap showed increased WBC in the joint, but culture negative.  Followed by ID, antibiotic switched to daptomycin 10/3. Patient condition is stable, discussed with ID, patient be treated with daptomycin for 6 weeks, has PICC line.   Echocardiogram obtained to rule out valvular abnormality with possible endocarditis, no obvious valvular abnormality noted  Will not plan TEE at this time, as patient will be treated with antibiotics for 6 weeks, though 11/12 Will plan for dc with home health to assist for Daptomycin dosing Pain control with as needed Dilaudid and Norco   Right shoulder bursitis, Possible septic arthritis MRI shoulder showed bursitis with large amount of effusion Joint tap showed significant white cells Shoulder arthroscopy on 10/8, culture pending On daptomycin   Acute kidney injury on chronic kidney disease stage IIIa Baseline creatinine 1.58, GFR 54 on 7/14 Creatinine 2.89 on admission with GFR 26, new presumed hypovolemic hyponatremia This appears to have normalized and is back to his baseline   Uncontrolled type 2 diabetes mellitus  A1c 13.2, very poor control He was given steroids in the OR yesterday and had elevated glucose Insulin pump was stopped and he was

## 2023-08-30 NOTE — TOC Transition Note (Signed)
Transition of Care [facility]) - CM/SW Discharge Note   Patient Details  Name: Richard Mendoza MRN: [id number] Date of Birth: 23-Apr-1976  [facility] CM/SW Contact:  Margarito Liner, LCSW Phone Number: 08/30/2023, 10:42 AM   Clinical Narrative:   Patient has orders to discharge home today. [facility] liaison is aware. 3-in-1 has been delivered. No further concerns. CSW signing off.  Final next level of care: Home w Home Health Services Barriers to Discharge: Barriers Resolved   Patient Goals and CMS Choice      Discharge Placement                  Patient to be transferred to facility by: Wife or son   Patient and family notified of of transfer: 08/30/23  Discharge Plan and Services Additional resources added to the After Visit Summary for       Post Acute Care Choice: NA                    HH Arranged: RN, IV Antibiotics HH Agency: [facility] Date HH Agency Contacted: 08/30/23   Representative spoke with at [facility] Agency: Jeri Modena  Social Determinants of Health (SDOH) Interventions SDOH Screenings   Food Insecurity: No Food Insecurity (06/01/2023)  Housing: High Risk (06/01/2023)  Transportation Needs: No Transportation Needs (06/01/2023)  Utilities: Not At Risk (06/01/2023)  Depression (PHQ2-9): Low Risk  (09/21/2019)  Financial Resource Strain: Low Risk  (07/24/2022)   Received from [facility]  Social Connections: Unknown (03/21/2022)   Received from [facility], [facility]  Stress: No Stress Concern Present (07/24/2022)   Received from [facility], [facility]  Tobacco Use: High Risk (08/27/2023)     Readmission Risk Interventions    08/21/2023    9:03 AM  Readmission Risk Prevention Plan  Transportation Screening Complete  PCP or Specialist Appt within 3-5 Days Complete  Social Work Consult for Recovery Care Planning/Counseling Complete  Palliative Care Screening Not Applicable  Medication Review [employer]) Complete

## 2023-08-31 LAB — CULTURE, BLOOD (ROUTINE X 2): Special Requests: ADEQUATE

## 2023-09-01 LAB — AEROBIC/ANAEROBIC CULTURE W GRAM STAIN (SURGICAL/DEEP WOUND): Culture: NO GROWTH

## 2023-09-02 ENCOUNTER — Ambulatory Visit: Payer: Medicaid Other | Attending: Internal Medicine

## 2023-09-02 DIAGNOSIS — R269 Unspecified abnormalities of gait and mobility: Secondary | ICD-10-CM | POA: Diagnosis not present

## 2023-09-03 DIAGNOSIS — M8600 Acute hematogenous osteomyelitis, unspecified site: Secondary | ICD-10-CM | POA: Diagnosis not present

## 2023-09-03 DIAGNOSIS — B9562 Methicillin resistant Staphylococcus aureus infection as the cause of diseases classified elsewhere: Secondary | ICD-10-CM | POA: Diagnosis not present

## 2023-09-03 DIAGNOSIS — T8744 Infection of amputation stump, left lower extremity: Secondary | ICD-10-CM | POA: Diagnosis not present

## 2023-09-04 ENCOUNTER — Telehealth: Payer: Self-pay

## 2023-09-04 ENCOUNTER — Other Ambulatory Visit: Payer: Self-pay

## 2023-09-04 MED ORDER — ONDANSETRON HCL 4 MG PO TABS: 4.0000 mg | ORAL_TABLET | Freq: Three times a day (TID) | ORAL | 1 refills | Status: DC | PRN

## 2023-09-04 NOTE — Telephone Encounter (Signed)
Patient spouse stating that patient is having nausea from abx.   Per Dr.Ravishankar - ok to send in zofran 4 mg PRN Q 8    RX sent to [facility] on [address].   Derren Suydam Lesli Albee, CMA

## 2023-09-06 ENCOUNTER — Encounter (INDEPENDENT_AMBULATORY_CARE_PROVIDER_SITE_OTHER): Payer: Self-pay | Admitting: Vascular Surgery

## 2023-09-06 ENCOUNTER — Ambulatory Visit (INDEPENDENT_AMBULATORY_CARE_PROVIDER_SITE_OTHER): Payer: Medicaid Other | Admitting: Vascular Surgery

## 2023-09-06 VITALS — BP 119/80 | HR 99

## 2023-09-06 DIAGNOSIS — Z89512 Acquired absence of left leg below knee: Secondary | ICD-10-CM

## 2023-09-06 DIAGNOSIS — E1142 Type 2 diabetes mellitus with diabetic polyneuropathy: Secondary | ICD-10-CM

## 2023-09-06 DIAGNOSIS — I10 Essential (primary) hypertension: Secondary | ICD-10-CM

## 2023-09-06 DIAGNOSIS — E785 Hyperlipidemia, unspecified: Secondary | ICD-10-CM

## 2023-09-06 NOTE — Assessment & Plan Note (Signed)
blood glucose control important in reducing the progression of atherosclerotic disease. Also, involved in wound healing. On appropriate medications.  

## 2023-09-06 NOTE — Progress Notes (Signed)
Result Value Ref Range   Sodium 138 135 - 145 mmol/L   Potassium 4.4 3.5 - 5.1 mmol/L   Chloride 107 98 - 111 mmol/L   CO2 22 22 - 32 mmol/L    Glucose, Bld 79 70 - 99 mg/dL    Comment: Glucose reference range applies only to samples taken after fasting for at least 8 hours.   BUN 31 (H) 6 - 20 mg/dL   Creatinine, Ser 1.02 (H) 0.61 - 1.24 mg/dL   Calcium 8.5 (L) 8.9 - 10.3 mg/dL   GFR, Estimated >72 >53 mL/min    Comment: (NOTE) Calculated using the CKD-EPI Creatinine Equation (2021)    Anion gap 9 5 - 15    Comment: Performed at [facility], [address] [place], [place] [address]  Glucose, capillary     Status: Abnormal   Collection Time: 08/30/23  8:25 AM  Result Value Ref Range   Glucose-Capillary 66 (L) 70 - 99 mg/dL    Comment: Glucose reference range applies only to samples taken after fasting for at least 8 hours.  Glucose, capillary     Status: None   Collection Time: 08/30/23  8:55 AM  Result Value Ref Range   Glucose-Capillary 97 70 - 99 mg/dL    Comment: Glucose reference range applies only to samples taken after fasting for at least 8 hours.  Glucose, capillary     Status: Abnormal   Collection Time: 08/30/23 11:22 AM  Result Value Ref Range   Glucose-Capillary 118 (H) 70 - 99 mg/dL    Comment: Glucose reference range applies only to samples taken after fasting for at least 8 hours.    Radiology ECHOCARDIOGRAM COMPLETE  Result Date: 08/28/2023    ECHOCARDIOGRAM REPORT   Patient Name:   Richard Mendoza Date of Exam: 08/28/2023 Medical Rec #:  [id number]      Height:       70.0 in Accession #:    [id number]     Weight:       193.3 lb Date of Birth:  05/23/1976      BSA:          2.058 m Patient Age:    47 years       BP:           188/96 mmHg Patient Gender: M              HR:           73 bpm. Exam Location:  [facility] Procedure: 2D Echo, Cardiac Doppler and Color Doppler Indications:     Endocarditis I38  History:         Patient has prior history of Echocardiogram examinations, most                  recent 04/04/2021. Risk Factors:Hypertension and Diabetes.  Sonographer:     Cristela Blue Referring Phys:   [id number] Marrion Coy Diagnosing Phys: Yvonne Kendall MD IMPRESSIONS  1. Left ventricular ejection fraction, by estimation, is 55 to 60%. The left ventricle has normal function. The left ventricle has no regional wall motion abnormalities. There is mild left ventricular hypertrophy. Left ventricular diastolic parameters are consistent with Grade I diastolic dysfunction (impaired relaxation).  2. Right ventricular systolic function is normal. The right ventricular size is normal. Tricuspid regurgitation signal is inadequate for assessing PA pressure.  3. The mitral valve is normal in structure. Trivial mitral valve regurgitation. No evidence of mitral stenosis.  4. The aortic valve  Result Value Ref Range   Sodium 138 135 - 145 mmol/L   Potassium 4.4 3.5 - 5.1 mmol/L   Chloride 107 98 - 111 mmol/L   CO2 22 22 - 32 mmol/L    Glucose, Bld 79 70 - 99 mg/dL    Comment: Glucose reference range applies only to samples taken after fasting for at least 8 hours.   BUN 31 (H) 6 - 20 mg/dL   Creatinine, Ser 1.02 (H) 0.61 - 1.24 mg/dL   Calcium 8.5 (L) 8.9 - 10.3 mg/dL   GFR, Estimated >72 >53 mL/min    Comment: (NOTE) Calculated using the CKD-EPI Creatinine Equation (2021)    Anion gap 9 5 - 15    Comment: Performed at [facility], [address] [place], [place] [address]  Glucose, capillary     Status: Abnormal   Collection Time: 08/30/23  8:25 AM  Result Value Ref Range   Glucose-Capillary 66 (L) 70 - 99 mg/dL    Comment: Glucose reference range applies only to samples taken after fasting for at least 8 hours.  Glucose, capillary     Status: None   Collection Time: 08/30/23  8:55 AM  Result Value Ref Range   Glucose-Capillary 97 70 - 99 mg/dL    Comment: Glucose reference range applies only to samples taken after fasting for at least 8 hours.  Glucose, capillary     Status: Abnormal   Collection Time: 08/30/23 11:22 AM  Result Value Ref Range   Glucose-Capillary 118 (H) 70 - 99 mg/dL    Comment: Glucose reference range applies only to samples taken after fasting for at least 8 hours.    Radiology ECHOCARDIOGRAM COMPLETE  Result Date: 08/28/2023    ECHOCARDIOGRAM REPORT   Patient Name:   Richard Mendoza Date of Exam: 08/28/2023 Medical Rec #:  [id number]      Height:       70.0 in Accession #:    [id number]     Weight:       193.3 lb Date of Birth:  05/23/1976      BSA:          2.058 m Patient Age:    47 years       BP:           188/96 mmHg Patient Gender: M              HR:           73 bpm. Exam Location:  [facility] Procedure: 2D Echo, Cardiac Doppler and Color Doppler Indications:     Endocarditis I38  History:         Patient has prior history of Echocardiogram examinations, most                  recent 04/04/2021. Risk Factors:Hypertension and Diabetes.  Sonographer:     Cristela Blue Referring Phys:   [id number] Marrion Coy Diagnosing Phys: Yvonne Kendall MD IMPRESSIONS  1. Left ventricular ejection fraction, by estimation, is 55 to 60%. The left ventricle has normal function. The left ventricle has no regional wall motion abnormalities. There is mild left ventricular hypertrophy. Left ventricular diastolic parameters are consistent with Grade I diastolic dysfunction (impaired relaxation).  2. Right ventricular systolic function is normal. The right ventricular size is normal. Tricuspid regurgitation signal is inadequate for assessing PA pressure.  3. The mitral valve is normal in structure. Trivial mitral valve regurgitation. No evidence of mitral stenosis.  4. The aortic valve  MRN : [id number]  Richard Mendoza is a 47 y.o. (06-28-1976) male who presents with chief complaint of  Chief Complaint  Patient presents with   Follow-up    Checking left stump  .  History of Present Illness: Patient returns today in follow up of left below-knee amputation stump.  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  Ideally, he would stay off the leg and not use a prosthesis until this wound is completely healed.  The infectious component seems much better.  His shoulder is also doing better after having a septic joint and having this washed out.  Current Outpatient Medications  Medication Sig Dispense Refill   aspirin EC 81 MG tablet Take 1 tablet (81 mg total) by mouth daily. Swallow whole.     atorvastatin (LIPITOR) 40 MG tablet Take 1 tablet (40 mg total) by mouth daily. 30 tablet 0   benazepril (LOTENSIN) 20 MG tablet Take 1 tablet (20 mg total) by mouth daily. 30 tablet 1   [employer] SENNA 8.6 MG tablet Take 1 tablet by mouth 2 (two) times daily as needed for constipation.  5   daptomycin (CUBICIN) IVPB Inject 800 mg into the vein daily. Indication: MRSA bacteremia/osteomyelitis of stump First Dose: Yes Last Day of Therapy: 10/01/2023 While on Daptomycin closely monitor CK and watch for rhabdomyolysis, eosinophilia, pneumonia Adjust Daptomycin frequency if crcl< 30 Labs - Once weekly:  CBC/D, CMP, and CPK Labs - Once weekly: ESR and CRP Fax weekly lab results  promptly to [phone] Please pull PIC at completion of IV antibiotics Method of administration: IV Push Method of administration may be changed at the discretion of home infusion pharmacist based upon assessment of the patient and/or caregiver's ability to self-administer the medication ordered. 35 Units 0   FIASP 100 UNIT/ML SOLN Inject into  the skin continuous. Via pump     HYDROcodone-acetaminophen (NORCO) 10-325 MG tablet Take 1 tablet by mouth every 6 (six) hours as needed for moderate pain or severe pain. 30 tablet 0   iron polysaccharides (NIFEREX) 150 MG capsule Take 1 capsule (150 mg total) by mouth daily. 30 capsule 0   nicotine (NICODERM CQ - DOSED IN MG/24 HOURS) 14 mg/24hr patch Place 1 patch (14 mg total) onto the skin daily. 28 patch 0   ondansetron (ZOFRAN) 4 MG tablet Take 1 tablet (4 mg total) by mouth every 8 (eight) hours as needed for nausea or vomiting. 20 tablet 1   HYDROmorphone (DILAUDID) 2 MG tablet Take 1 tablet (2 mg total) by mouth every 8 (eight) hours. (Patient not taking: Reported on 09/06/2023) 16 tablet 0   No current facility-administered medications for this visit.    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 (06-28-1976) male who presents with chief complaint of  Chief Complaint  Patient presents with   Follow-up    Checking left stump  .  History of Present Illness: Patient returns today in follow up of left below-knee amputation stump.  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  Ideally, he would stay off the leg and not use a prosthesis until this wound is completely healed.  The infectious component seems much better.  His shoulder is also doing better after having a septic joint and having this washed out.  Current Outpatient Medications  Medication Sig Dispense Refill   aspirin EC 81 MG tablet Take 1 tablet (81 mg total) by mouth daily. Swallow whole.     atorvastatin (LIPITOR) 40 MG tablet Take 1 tablet (40 mg total) by mouth daily. 30 tablet 0   benazepril (LOTENSIN) 20 MG tablet Take 1 tablet (20 mg total) by mouth daily. 30 tablet 1   [employer] SENNA 8.6 MG tablet Take 1 tablet by mouth 2 (two) times daily as needed for constipation.  5   daptomycin (CUBICIN) IVPB Inject 800 mg into the vein daily. Indication: MRSA bacteremia/osteomyelitis of stump First Dose: Yes Last Day of Therapy: 10/01/2023 While on Daptomycin closely monitor CK and watch for rhabdomyolysis, eosinophilia, pneumonia Adjust Daptomycin frequency if crcl< 30 Labs - Once weekly:  CBC/D, CMP, and CPK Labs - Once weekly: ESR and CRP Fax weekly lab results  promptly to [phone] Please pull PIC at completion of IV antibiotics Method of administration: IV Push Method of administration may be changed at the discretion of home infusion pharmacist based upon assessment of the patient and/or caregiver's ability to self-administer the medication ordered. 35 Units 0   FIASP 100 UNIT/ML SOLN Inject into  the skin continuous. Via pump     HYDROcodone-acetaminophen (NORCO) 10-325 MG tablet Take 1 tablet by mouth every 6 (six) hours as needed for moderate pain or severe pain. 30 tablet 0   iron polysaccharides (NIFEREX) 150 MG capsule Take 1 capsule (150 mg total) by mouth daily. 30 capsule 0   nicotine (NICODERM CQ - DOSED IN MG/24 HOURS) 14 mg/24hr patch Place 1 patch (14 mg total) onto the skin daily. 28 patch 0   ondansetron (ZOFRAN) 4 MG tablet Take 1 tablet (4 mg total) by mouth every 8 (eight) hours as needed for nausea or vomiting. 20 tablet 1   HYDROmorphone (DILAUDID) 2 MG tablet Take 1 tablet (2 mg total) by mouth every 8 (eight) hours. (Patient not taking: Reported on 09/06/2023) 16 tablet 0   No current facility-administered medications for this visit.    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   BUN 31 (H) 6 - 20 mg/dL   Creatinine, Ser 1.02 (H) 0.61 - 1.24 mg/dL   Calcium 8.5 (L) 8.9 - 10.3 mg/dL   GFR, Estimated >72 >53 mL/min    Comment: (NOTE) Calculated using the CKD-EPI Creatinine Equation (2021)    Anion gap 9 5 - 15    Comment: Performed at [facility], [address] [place], [place] [address]  Glucose, capillary     Status: Abnormal   Collection Time: 08/30/23  8:25 AM  Result Value Ref Range   Glucose-Capillary 66 (L) 70 - 99 mg/dL    Comment: Glucose reference range applies only to samples taken after fasting for at least 8 hours.  Glucose, capillary     Status: None   Collection Time: 08/30/23  8:55 AM  Result Value Ref Range   Glucose-Capillary 97 70 - 99 mg/dL    Comment: Glucose reference range applies only to samples taken after fasting for at least 8 hours.  Glucose, capillary     Status: Abnormal   Collection Time: 08/30/23 11:22 AM  Result Value Ref Range   Glucose-Capillary 118 (H) 70 - 99 mg/dL    Comment: Glucose reference range applies only to samples taken after fasting for at least 8 hours.    Radiology ECHOCARDIOGRAM COMPLETE  Result Date: 08/28/2023    ECHOCARDIOGRAM REPORT   Patient Name:   Richard Mendoza Date of Exam: 08/28/2023 Medical Rec #:  [id number]      Height:       70.0 in Accession #:    [id number]     Weight:       193.3 lb Date of Birth:  05/23/1976      BSA:          2.058 m Patient Age:    47 years       BP:           188/96 mmHg Patient Gender: M              HR:           73 bpm. Exam Location:  [facility] Procedure: 2D Echo, Cardiac Doppler and Color Doppler Indications:     Endocarditis I38  History:         Patient has prior history of Echocardiogram examinations, most                  recent 04/04/2021. Risk Factors:Hypertension and Diabetes.  Sonographer:     Cristela Blue Referring Phys:   [id number] Marrion Coy Diagnosing Phys: Yvonne Kendall MD IMPRESSIONS  1. Left ventricular ejection fraction, by estimation, is 55 to 60%. The left ventricle has normal function. The left ventricle has no regional wall motion abnormalities. There is mild left ventricular hypertrophy. Left ventricular diastolic parameters are consistent with Grade I diastolic dysfunction (impaired relaxation).  2. Right ventricular systolic function is normal. The right ventricular size is normal. Tricuspid regurgitation signal is inadequate for assessing PA pressure.  3. The mitral valve is normal in structure. Trivial mitral valve regurgitation. No evidence of mitral stenosis.  4. The aortic valve  MRN : [id number]  Richard Mendoza is a 47 y.o. (06-28-1976) male who presents with chief complaint of  Chief Complaint  Patient presents with   Follow-up    Checking left stump  .  History of Present Illness: Patient returns today in follow up of left below-knee amputation stump.  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  Ideally, he would stay off the leg and not use a prosthesis until this wound is completely healed.  The infectious component seems much better.  His shoulder is also doing better after having a septic joint and having this washed out.  Current Outpatient Medications  Medication Sig Dispense Refill   aspirin EC 81 MG tablet Take 1 tablet (81 mg total) by mouth daily. Swallow whole.     atorvastatin (LIPITOR) 40 MG tablet Take 1 tablet (40 mg total) by mouth daily. 30 tablet 0   benazepril (LOTENSIN) 20 MG tablet Take 1 tablet (20 mg total) by mouth daily. 30 tablet 1   [employer] SENNA 8.6 MG tablet Take 1 tablet by mouth 2 (two) times daily as needed for constipation.  5   daptomycin (CUBICIN) IVPB Inject 800 mg into the vein daily. Indication: MRSA bacteremia/osteomyelitis of stump First Dose: Yes Last Day of Therapy: 10/01/2023 While on Daptomycin closely monitor CK and watch for rhabdomyolysis, eosinophilia, pneumonia Adjust Daptomycin frequency if crcl< 30 Labs - Once weekly:  CBC/D, CMP, and CPK Labs - Once weekly: ESR and CRP Fax weekly lab results  promptly to [phone] Please pull PIC at completion of IV antibiotics Method of administration: IV Push Method of administration may be changed at the discretion of home infusion pharmacist based upon assessment of the patient and/or caregiver's ability to self-administer the medication ordered. 35 Units 0   FIASP 100 UNIT/ML SOLN Inject into  the skin continuous. Via pump     HYDROcodone-acetaminophen (NORCO) 10-325 MG tablet Take 1 tablet by mouth every 6 (six) hours as needed for moderate pain or severe pain. 30 tablet 0   iron polysaccharides (NIFEREX) 150 MG capsule Take 1 capsule (150 mg total) by mouth daily. 30 capsule 0   nicotine (NICODERM CQ - DOSED IN MG/24 HOURS) 14 mg/24hr patch Place 1 patch (14 mg total) onto the skin daily. 28 patch 0   ondansetron (ZOFRAN) 4 MG tablet Take 1 tablet (4 mg total) by mouth every 8 (eight) hours as needed for nausea or vomiting. 20 tablet 1   HYDROmorphone (DILAUDID) 2 MG tablet Take 1 tablet (2 mg total) by mouth every 8 (eight) hours. (Patient not taking: Reported on 09/06/2023) 16 tablet 0   No current facility-administered medications for this visit.    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 (06-28-1976) male who presents with chief complaint of  Chief Complaint  Patient presents with   Follow-up    Checking left stump  .  History of Present Illness: Patient returns today in follow up of left below-knee amputation stump.  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  Ideally, he would stay off the leg and not use a prosthesis until this wound is completely healed.  The infectious component seems much better.  His shoulder is also doing better after having a septic joint and having this washed out.  Current Outpatient Medications  Medication Sig Dispense Refill   aspirin EC 81 MG tablet Take 1 tablet (81 mg total) by mouth daily. Swallow whole.     atorvastatin (LIPITOR) 40 MG tablet Take 1 tablet (40 mg total) by mouth daily. 30 tablet 0   benazepril (LOTENSIN) 20 MG tablet Take 1 tablet (20 mg total) by mouth daily. 30 tablet 1   [employer] SENNA 8.6 MG tablet Take 1 tablet by mouth 2 (two) times daily as needed for constipation.  5   daptomycin (CUBICIN) IVPB Inject 800 mg into the vein daily. Indication: MRSA bacteremia/osteomyelitis of stump First Dose: Yes Last Day of Therapy: 10/01/2023 While on Daptomycin closely monitor CK and watch for rhabdomyolysis, eosinophilia, pneumonia Adjust Daptomycin frequency if crcl< 30 Labs - Once weekly:  CBC/D, CMP, and CPK Labs - Once weekly: ESR and CRP Fax weekly lab results  promptly to [phone] Please pull PIC at completion of IV antibiotics Method of administration: IV Push Method of administration may be changed at the discretion of home infusion pharmacist based upon assessment of the patient and/or caregiver's ability to self-administer the medication ordered. 35 Units 0   FIASP 100 UNIT/ML SOLN Inject into  the skin continuous. Via pump     HYDROcodone-acetaminophen (NORCO) 10-325 MG tablet Take 1 tablet by mouth every 6 (six) hours as needed for moderate pain or severe pain. 30 tablet 0   iron polysaccharides (NIFEREX) 150 MG capsule Take 1 capsule (150 mg total) by mouth daily. 30 capsule 0   nicotine (NICODERM CQ - DOSED IN MG/24 HOURS) 14 mg/24hr patch Place 1 patch (14 mg total) onto the skin daily. 28 patch 0   ondansetron (ZOFRAN) 4 MG tablet Take 1 tablet (4 mg total) by mouth every 8 (eight) hours as needed for nausea or vomiting. 20 tablet 1   HYDROmorphone (DILAUDID) 2 MG tablet Take 1 tablet (2 mg total) by mouth every 8 (eight) hours. (Patient not taking: Reported on 09/06/2023) 16 tablet 0   No current facility-administered medications for this visit.    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 (06-28-1976) male who presents with chief complaint of  Chief Complaint  Patient presents with   Follow-up    Checking left stump  .  History of Present Illness: Patient returns today in follow up of left below-knee amputation stump.  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  Ideally, he would stay off the leg and not use a prosthesis until this wound is completely healed.  The infectious component seems much better.  His shoulder is also doing better after having a septic joint and having this washed out.  Current Outpatient Medications  Medication Sig Dispense Refill   aspirin EC 81 MG tablet Take 1 tablet (81 mg total) by mouth daily. Swallow whole.     atorvastatin (LIPITOR) 40 MG tablet Take 1 tablet (40 mg total) by mouth daily. 30 tablet 0   benazepril (LOTENSIN) 20 MG tablet Take 1 tablet (20 mg total) by mouth daily. 30 tablet 1   [employer] SENNA 8.6 MG tablet Take 1 tablet by mouth 2 (two) times daily as needed for constipation.  5   daptomycin (CUBICIN) IVPB Inject 800 mg into the vein daily. Indication: MRSA bacteremia/osteomyelitis of stump First Dose: Yes Last Day of Therapy: 10/01/2023 While on Daptomycin closely monitor CK and watch for rhabdomyolysis, eosinophilia, pneumonia Adjust Daptomycin frequency if crcl< 30 Labs - Once weekly:  CBC/D, CMP, and CPK Labs - Once weekly: ESR and CRP Fax weekly lab results  promptly to [phone] Please pull PIC at completion of IV antibiotics Method of administration: IV Push Method of administration may be changed at the discretion of home infusion pharmacist based upon assessment of the patient and/or caregiver's ability to self-administer the medication ordered. 35 Units 0   FIASP 100 UNIT/ML SOLN Inject into  the skin continuous. Via pump     HYDROcodone-acetaminophen (NORCO) 10-325 MG tablet Take 1 tablet by mouth every 6 (six) hours as needed for moderate pain or severe pain. 30 tablet 0   iron polysaccharides (NIFEREX) 150 MG capsule Take 1 capsule (150 mg total) by mouth daily. 30 capsule 0   nicotine (NICODERM CQ - DOSED IN MG/24 HOURS) 14 mg/24hr patch Place 1 patch (14 mg total) onto the skin daily. 28 patch 0   ondansetron (ZOFRAN) 4 MG tablet Take 1 tablet (4 mg total) by mouth every 8 (eight) hours as needed for nausea or vomiting. 20 tablet 1   HYDROmorphone (DILAUDID) 2 MG tablet Take 1 tablet (2 mg total) by mouth every 8 (eight) hours. (Patient not taking: Reported on 09/06/2023) 16 tablet 0   No current facility-administered medications for this visit.    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   BUN 31 (H) 6 - 20 mg/dL   Creatinine, Ser 1.02 (H) 0.61 - 1.24 mg/dL   Calcium 8.5 (L) 8.9 - 10.3 mg/dL   GFR, Estimated >72 >53 mL/min    Comment: (NOTE) Calculated using the CKD-EPI Creatinine Equation (2021)    Anion gap 9 5 - 15    Comment: Performed at [facility], [address] [place], [place] [address]  Glucose, capillary     Status: Abnormal   Collection Time: 08/30/23  8:25 AM  Result Value Ref Range   Glucose-Capillary 66 (L) 70 - 99 mg/dL    Comment: Glucose reference range applies only to samples taken after fasting for at least 8 hours.  Glucose, capillary     Status: None   Collection Time: 08/30/23  8:55 AM  Result Value Ref Range   Glucose-Capillary 97 70 - 99 mg/dL    Comment: Glucose reference range applies only to samples taken after fasting for at least 8 hours.  Glucose, capillary     Status: Abnormal   Collection Time: 08/30/23 11:22 AM  Result Value Ref Range   Glucose-Capillary 118 (H) 70 - 99 mg/dL    Comment: Glucose reference range applies only to samples taken after fasting for at least 8 hours.    Radiology ECHOCARDIOGRAM COMPLETE  Result Date: 08/28/2023    ECHOCARDIOGRAM REPORT   Patient Name:   Richard Mendoza Date of Exam: 08/28/2023 Medical Rec #:  [id number]      Height:       70.0 in Accession #:    [id number]     Weight:       193.3 lb Date of Birth:  05/23/1976      BSA:          2.058 m Patient Age:    47 years       BP:           188/96 mmHg Patient Gender: M              HR:           73 bpm. Exam Location:  [facility] Procedure: 2D Echo, Cardiac Doppler and Color Doppler Indications:     Endocarditis I38  History:         Patient has prior history of Echocardiogram examinations, most                  recent 04/04/2021. Risk Factors:Hypertension and Diabetes.  Sonographer:     Cristela Blue Referring Phys:   [id number] Marrion Coy Diagnosing Phys: Yvonne Kendall MD IMPRESSIONS  1. Left ventricular ejection fraction, by estimation, is 55 to 60%. The left ventricle has normal function. The left ventricle has no regional wall motion abnormalities. There is mild left ventricular hypertrophy. Left ventricular diastolic parameters are consistent with Grade I diastolic dysfunction (impaired relaxation).  2. Right ventricular systolic function is normal. The right ventricular size is normal. Tricuspid regurgitation signal is inadequate for assessing PA pressure.  3. The mitral valve is normal in structure. Trivial mitral valve regurgitation. No evidence of mitral stenosis.  4. The aortic valve  MRN : [id number]  Richard Mendoza is a 47 y.o. (06-28-1976) male who presents with chief complaint of  Chief Complaint  Patient presents with   Follow-up    Checking left stump  .  History of Present Illness: Patient returns today in follow up of left below-knee amputation stump.  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  Ideally, he would stay off the leg and not use a prosthesis until this wound is completely healed.  The infectious component seems much better.  His shoulder is also doing better after having a septic joint and having this washed out.  Current Outpatient Medications  Medication Sig Dispense Refill   aspirin EC 81 MG tablet Take 1 tablet (81 mg total) by mouth daily. Swallow whole.     atorvastatin (LIPITOR) 40 MG tablet Take 1 tablet (40 mg total) by mouth daily. 30 tablet 0   benazepril (LOTENSIN) 20 MG tablet Take 1 tablet (20 mg total) by mouth daily. 30 tablet 1   [employer] SENNA 8.6 MG tablet Take 1 tablet by mouth 2 (two) times daily as needed for constipation.  5   daptomycin (CUBICIN) IVPB Inject 800 mg into the vein daily. Indication: MRSA bacteremia/osteomyelitis of stump First Dose: Yes Last Day of Therapy: 10/01/2023 While on Daptomycin closely monitor CK and watch for rhabdomyolysis, eosinophilia, pneumonia Adjust Daptomycin frequency if crcl< 30 Labs - Once weekly:  CBC/D, CMP, and CPK Labs - Once weekly: ESR and CRP Fax weekly lab results  promptly to [phone] Please pull PIC at completion of IV antibiotics Method of administration: IV Push Method of administration may be changed at the discretion of home infusion pharmacist based upon assessment of the patient and/or caregiver's ability to self-administer the medication ordered. 35 Units 0   FIASP 100 UNIT/ML SOLN Inject into  the skin continuous. Via pump     HYDROcodone-acetaminophen (NORCO) 10-325 MG tablet Take 1 tablet by mouth every 6 (six) hours as needed for moderate pain or severe pain. 30 tablet 0   iron polysaccharides (NIFEREX) 150 MG capsule Take 1 capsule (150 mg total) by mouth daily. 30 capsule 0   nicotine (NICODERM CQ - DOSED IN MG/24 HOURS) 14 mg/24hr patch Place 1 patch (14 mg total) onto the skin daily. 28 patch 0   ondansetron (ZOFRAN) 4 MG tablet Take 1 tablet (4 mg total) by mouth every 8 (eight) hours as needed for nausea or vomiting. 20 tablet 1   HYDROmorphone (DILAUDID) 2 MG tablet Take 1 tablet (2 mg total) by mouth every 8 (eight) hours. (Patient not taking: Reported on 09/06/2023) 16 tablet 0   No current facility-administered medications for this visit.    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 (06-28-1976) male who presents with chief complaint of  Chief Complaint  Patient presents with   Follow-up    Checking left stump  .  History of Present Illness: Patient returns today in follow up of left below-knee amputation stump.  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  Ideally, he would stay off the leg and not use a prosthesis until this wound is completely healed.  The infectious component seems much better.  His shoulder is also doing better after having a septic joint and having this washed out.  Current Outpatient Medications  Medication Sig Dispense Refill   aspirin EC 81 MG tablet Take 1 tablet (81 mg total) by mouth daily. Swallow whole.     atorvastatin (LIPITOR) 40 MG tablet Take 1 tablet (40 mg total) by mouth daily. 30 tablet 0   benazepril (LOTENSIN) 20 MG tablet Take 1 tablet (20 mg total) by mouth daily. 30 tablet 1   [employer] SENNA 8.6 MG tablet Take 1 tablet by mouth 2 (two) times daily as needed for constipation.  5   daptomycin (CUBICIN) IVPB Inject 800 mg into the vein daily. Indication: MRSA bacteremia/osteomyelitis of stump First Dose: Yes Last Day of Therapy: 10/01/2023 While on Daptomycin closely monitor CK and watch for rhabdomyolysis, eosinophilia, pneumonia Adjust Daptomycin frequency if crcl< 30 Labs - Once weekly:  CBC/D, CMP, and CPK Labs - Once weekly: ESR and CRP Fax weekly lab results  promptly to [phone] Please pull PIC at completion of IV antibiotics Method of administration: IV Push Method of administration may be changed at the discretion of home infusion pharmacist based upon assessment of the patient and/or caregiver's ability to self-administer the medication ordered. 35 Units 0   FIASP 100 UNIT/ML SOLN Inject into  the skin continuous. Via pump     HYDROcodone-acetaminophen (NORCO) 10-325 MG tablet Take 1 tablet by mouth every 6 (six) hours as needed for moderate pain or severe pain. 30 tablet 0   iron polysaccharides (NIFEREX) 150 MG capsule Take 1 capsule (150 mg total) by mouth daily. 30 capsule 0   nicotine (NICODERM CQ - DOSED IN MG/24 HOURS) 14 mg/24hr patch Place 1 patch (14 mg total) onto the skin daily. 28 patch 0   ondansetron (ZOFRAN) 4 MG tablet Take 1 tablet (4 mg total) by mouth every 8 (eight) hours as needed for nausea or vomiting. 20 tablet 1   HYDROmorphone (DILAUDID) 2 MG tablet Take 1 tablet (2 mg total) by mouth every 8 (eight) hours. (Patient not taking: Reported on 09/06/2023) 16 tablet 0   No current facility-administered medications for this visit.    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 (06-28-1976) male who presents with chief complaint of  Chief Complaint  Patient presents with   Follow-up    Checking left stump  .  History of Present Illness: Patient returns today in follow up of left below-knee amputation stump.  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  Ideally, he would stay off the leg and not use a prosthesis until this wound is completely healed.  The infectious component seems much better.  His shoulder is also doing better after having a septic joint and having this washed out.  Current Outpatient Medications  Medication Sig Dispense Refill   aspirin EC 81 MG tablet Take 1 tablet (81 mg total) by mouth daily. Swallow whole.     atorvastatin (LIPITOR) 40 MG tablet Take 1 tablet (40 mg total) by mouth daily. 30 tablet 0   benazepril (LOTENSIN) 20 MG tablet Take 1 tablet (20 mg total) by mouth daily. 30 tablet 1   [employer] SENNA 8.6 MG tablet Take 1 tablet by mouth 2 (two) times daily as needed for constipation.  5   daptomycin (CUBICIN) IVPB Inject 800 mg into the vein daily. Indication: MRSA bacteremia/osteomyelitis of stump First Dose: Yes Last Day of Therapy: 10/01/2023 While on Daptomycin closely monitor CK and watch for rhabdomyolysis, eosinophilia, pneumonia Adjust Daptomycin frequency if crcl< 30 Labs - Once weekly:  CBC/D, CMP, and CPK Labs - Once weekly: ESR and CRP Fax weekly lab results  promptly to [phone] Please pull PIC at completion of IV antibiotics Method of administration: IV Push Method of administration may be changed at the discretion of home infusion pharmacist based upon assessment of the patient and/or caregiver's ability to self-administer the medication ordered. 35 Units 0   FIASP 100 UNIT/ML SOLN Inject into  the skin continuous. Via pump     HYDROcodone-acetaminophen (NORCO) 10-325 MG tablet Take 1 tablet by mouth every 6 (six) hours as needed for moderate pain or severe pain. 30 tablet 0   iron polysaccharides (NIFEREX) 150 MG capsule Take 1 capsule (150 mg total) by mouth daily. 30 capsule 0   nicotine (NICODERM CQ - DOSED IN MG/24 HOURS) 14 mg/24hr patch Place 1 patch (14 mg total) onto the skin daily. 28 patch 0   ondansetron (ZOFRAN) 4 MG tablet Take 1 tablet (4 mg total) by mouth every 8 (eight) hours as needed for nausea or vomiting. 20 tablet 1   HYDROmorphone (DILAUDID) 2 MG tablet Take 1 tablet (2 mg total) by mouth every 8 (eight) hours. (Patient not taking: Reported on 09/06/2023) 16 tablet 0   No current facility-administered medications for this visit.    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 (06-28-1976) male who presents with chief complaint of  Chief Complaint  Patient presents with   Follow-up    Checking left stump  .  History of Present Illness: Patient returns today in follow up of left below-knee amputation stump.  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  Ideally, he would stay off the leg and not use a prosthesis until this wound is completely healed.  The infectious component seems much better.  His shoulder is also doing better after having a septic joint and having this washed out.  Current Outpatient Medications  Medication Sig Dispense Refill   aspirin EC 81 MG tablet Take 1 tablet (81 mg total) by mouth daily. Swallow whole.     atorvastatin (LIPITOR) 40 MG tablet Take 1 tablet (40 mg total) by mouth daily. 30 tablet 0   benazepril (LOTENSIN) 20 MG tablet Take 1 tablet (20 mg total) by mouth daily. 30 tablet 1   [employer] SENNA 8.6 MG tablet Take 1 tablet by mouth 2 (two) times daily as needed for constipation.  5   daptomycin (CUBICIN) IVPB Inject 800 mg into the vein daily. Indication: MRSA bacteremia/osteomyelitis of stump First Dose: Yes Last Day of Therapy: 10/01/2023 While on Daptomycin closely monitor CK and watch for rhabdomyolysis, eosinophilia, pneumonia Adjust Daptomycin frequency if crcl< 30 Labs - Once weekly:  CBC/D, CMP, and CPK Labs - Once weekly: ESR and CRP Fax weekly lab results  promptly to [phone] Please pull PIC at completion of IV antibiotics Method of administration: IV Push Method of administration may be changed at the discretion of home infusion pharmacist based upon assessment of the patient and/or caregiver's ability to self-administer the medication ordered. 35 Units 0   FIASP 100 UNIT/ML SOLN Inject into  the skin continuous. Via pump     HYDROcodone-acetaminophen (NORCO) 10-325 MG tablet Take 1 tablet by mouth every 6 (six) hours as needed for moderate pain or severe pain. 30 tablet 0   iron polysaccharides (NIFEREX) 150 MG capsule Take 1 capsule (150 mg total) by mouth daily. 30 capsule 0   nicotine (NICODERM CQ - DOSED IN MG/24 HOURS) 14 mg/24hr patch Place 1 patch (14 mg total) onto the skin daily. 28 patch 0   ondansetron (ZOFRAN) 4 MG tablet Take 1 tablet (4 mg total) by mouth every 8 (eight) hours as needed for nausea or vomiting. 20 tablet 1   HYDROmorphone (DILAUDID) 2 MG tablet Take 1 tablet (2 mg total) by mouth every 8 (eight) hours. (Patient not taking: Reported on 09/06/2023) 16 tablet 0   No current facility-administered medications for this visit.    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 (06-28-1976) male who presents with chief complaint of  Chief Complaint  Patient presents with   Follow-up    Checking left stump  .  History of Present Illness: Patient returns today in follow up of left below-knee amputation stump.  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  Ideally, he would stay off the leg and not use a prosthesis until this wound is completely healed.  The infectious component seems much better.  His shoulder is also doing better after having a septic joint and having this washed out.  Current Outpatient Medications  Medication Sig Dispense Refill   aspirin EC 81 MG tablet Take 1 tablet (81 mg total) by mouth daily. Swallow whole.     atorvastatin (LIPITOR) 40 MG tablet Take 1 tablet (40 mg total) by mouth daily. 30 tablet 0   benazepril (LOTENSIN) 20 MG tablet Take 1 tablet (20 mg total) by mouth daily. 30 tablet 1   [employer] SENNA 8.6 MG tablet Take 1 tablet by mouth 2 (two) times daily as needed for constipation.  5   daptomycin (CUBICIN) IVPB Inject 800 mg into the vein daily. Indication: MRSA bacteremia/osteomyelitis of stump First Dose: Yes Last Day of Therapy: 10/01/2023 While on Daptomycin closely monitor CK and watch for rhabdomyolysis, eosinophilia, pneumonia Adjust Daptomycin frequency if crcl< 30 Labs - Once weekly:  CBC/D, CMP, and CPK Labs - Once weekly: ESR and CRP Fax weekly lab results  promptly to [phone] Please pull PIC at completion of IV antibiotics Method of administration: IV Push Method of administration may be changed at the discretion of home infusion pharmacist based upon assessment of the patient and/or caregiver's ability to self-administer the medication ordered. 35 Units 0   FIASP 100 UNIT/ML SOLN Inject into  the skin continuous. Via pump     HYDROcodone-acetaminophen (NORCO) 10-325 MG tablet Take 1 tablet by mouth every 6 (six) hours as needed for moderate pain or severe pain. 30 tablet 0   iron polysaccharides (NIFEREX) 150 MG capsule Take 1 capsule (150 mg total) by mouth daily. 30 capsule 0   nicotine (NICODERM CQ - DOSED IN MG/24 HOURS) 14 mg/24hr patch Place 1 patch (14 mg total) onto the skin daily. 28 patch 0   ondansetron (ZOFRAN) 4 MG tablet Take 1 tablet (4 mg total) by mouth every 8 (eight) hours as needed for nausea or vomiting. 20 tablet 1   HYDROmorphone (DILAUDID) 2 MG tablet Take 1 tablet (2 mg total) by mouth every 8 (eight) hours. (Patient not taking: Reported on 09/06/2023) 16 tablet 0   No current facility-administered medications for this visit.    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   BUN 31 (H) 6 - 20 mg/dL   Creatinine, Ser 1.02 (H) 0.61 - 1.24 mg/dL   Calcium 8.5 (L) 8.9 - 10.3 mg/dL   GFR, Estimated >72 >53 mL/min    Comment: (NOTE) Calculated using the CKD-EPI Creatinine Equation (2021)    Anion gap 9 5 - 15    Comment: Performed at [facility], [address] [place], [place] [address]  Glucose, capillary     Status: Abnormal   Collection Time: 08/30/23  8:25 AM  Result Value Ref Range   Glucose-Capillary 66 (L) 70 - 99 mg/dL    Comment: Glucose reference range applies only to samples taken after fasting for at least 8 hours.  Glucose, capillary     Status: None   Collection Time: 08/30/23  8:55 AM  Result Value Ref Range   Glucose-Capillary 97 70 - 99 mg/dL    Comment: Glucose reference range applies only to samples taken after fasting for at least 8 hours.  Glucose, capillary     Status: Abnormal   Collection Time: 08/30/23 11:22 AM  Result Value Ref Range   Glucose-Capillary 118 (H) 70 - 99 mg/dL    Comment: Glucose reference range applies only to samples taken after fasting for at least 8 hours.    Radiology ECHOCARDIOGRAM COMPLETE  Result Date: 08/28/2023    ECHOCARDIOGRAM REPORT   Patient Name:   Richard Mendoza Date of Exam: 08/28/2023 Medical Rec #:  [id number]      Height:       70.0 in Accession #:    [id number]     Weight:       193.3 lb Date of Birth:  05/23/1976      BSA:          2.058 m Patient Age:    47 years       BP:           188/96 mmHg Patient Gender: M              HR:           73 bpm. Exam Location:  [facility] Procedure: 2D Echo, Cardiac Doppler and Color Doppler Indications:     Endocarditis I38  History:         Patient has prior history of Echocardiogram examinations, most                  recent 04/04/2021. Risk Factors:Hypertension and Diabetes.  Sonographer:     Cristela Blue Referring Phys:   [id number] Marrion Coy Diagnosing Phys: Yvonne Kendall MD IMPRESSIONS  1. Left ventricular ejection fraction, by estimation, is 55 to 60%. The left ventricle has normal function. The left ventricle has no regional wall motion abnormalities. There is mild left ventricular hypertrophy. Left ventricular diastolic parameters are consistent with Grade I diastolic dysfunction (impaired relaxation).  2. Right ventricular systolic function is normal. The right ventricular size is normal. Tricuspid regurgitation signal is inadequate for assessing PA pressure.  3. The mitral valve is normal in structure. Trivial mitral valve regurgitation. No evidence of mitral stenosis.  4. The aortic valve  Result Value Ref Range   Sodium 138 135 - 145 mmol/L   Potassium 4.4 3.5 - 5.1 mmol/L   Chloride 107 98 - 111 mmol/L   CO2 22 22 - 32 mmol/L    Glucose, Bld 79 70 - 99 mg/dL    Comment: Glucose reference range applies only to samples taken after fasting for at least 8 hours.   BUN 31 (H) 6 - 20 mg/dL   Creatinine, Ser 1.02 (H) 0.61 - 1.24 mg/dL   Calcium 8.5 (L) 8.9 - 10.3 mg/dL   GFR, Estimated >72 >53 mL/min    Comment: (NOTE) Calculated using the CKD-EPI Creatinine Equation (2021)    Anion gap 9 5 - 15    Comment: Performed at [facility], [address] [place], [place] [address]  Glucose, capillary     Status: Abnormal   Collection Time: 08/30/23  8:25 AM  Result Value Ref Range   Glucose-Capillary 66 (L) 70 - 99 mg/dL    Comment: Glucose reference range applies only to samples taken after fasting for at least 8 hours.  Glucose, capillary     Status: None   Collection Time: 08/30/23  8:55 AM  Result Value Ref Range   Glucose-Capillary 97 70 - 99 mg/dL    Comment: Glucose reference range applies only to samples taken after fasting for at least 8 hours.  Glucose, capillary     Status: Abnormal   Collection Time: 08/30/23 11:22 AM  Result Value Ref Range   Glucose-Capillary 118 (H) 70 - 99 mg/dL    Comment: Glucose reference range applies only to samples taken after fasting for at least 8 hours.    Radiology ECHOCARDIOGRAM COMPLETE  Result Date: 08/28/2023    ECHOCARDIOGRAM REPORT   Patient Name:   Richard Mendoza Date of Exam: 08/28/2023 Medical Rec #:  [id number]      Height:       70.0 in Accession #:    [id number]     Weight:       193.3 lb Date of Birth:  05/23/1976      BSA:          2.058 m Patient Age:    47 years       BP:           188/96 mmHg Patient Gender: M              HR:           73 bpm. Exam Location:  [facility] Procedure: 2D Echo, Cardiac Doppler and Color Doppler Indications:     Endocarditis I38  History:         Patient has prior history of Echocardiogram examinations, most                  recent 04/04/2021. Risk Factors:Hypertension and Diabetes.  Sonographer:     Cristela Blue Referring Phys:   [id number] Marrion Coy Diagnosing Phys: Yvonne Kendall MD IMPRESSIONS  1. Left ventricular ejection fraction, by estimation, is 55 to 60%. The left ventricle has normal function. The left ventricle has no regional wall motion abnormalities. There is mild left ventricular hypertrophy. Left ventricular diastolic parameters are consistent with Grade I diastolic dysfunction (impaired relaxation).  2. Right ventricular systolic function is normal. The right ventricular size is normal. Tricuspid regurgitation signal is inadequate for assessing PA pressure.  3. The mitral valve is normal in structure. Trivial mitral valve regurgitation. No evidence of mitral stenosis.  4. The aortic valve  MRN : [id number]  Richard Mendoza is a 47 y.o. (06-28-1976) male who presents with chief complaint of  Chief Complaint  Patient presents with   Follow-up    Checking left stump  .  History of Present Illness: Patient returns today in follow up of left below-knee amputation stump.  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  Ideally, he would stay off the leg and not use a prosthesis until this wound is completely healed.  The infectious component seems much better.  His shoulder is also doing better after having a septic joint and having this washed out.  Current Outpatient Medications  Medication Sig Dispense Refill   aspirin EC 81 MG tablet Take 1 tablet (81 mg total) by mouth daily. Swallow whole.     atorvastatin (LIPITOR) 40 MG tablet Take 1 tablet (40 mg total) by mouth daily. 30 tablet 0   benazepril (LOTENSIN) 20 MG tablet Take 1 tablet (20 mg total) by mouth daily. 30 tablet 1   [employer] SENNA 8.6 MG tablet Take 1 tablet by mouth 2 (two) times daily as needed for constipation.  5   daptomycin (CUBICIN) IVPB Inject 800 mg into the vein daily. Indication: MRSA bacteremia/osteomyelitis of stump First Dose: Yes Last Day of Therapy: 10/01/2023 While on Daptomycin closely monitor CK and watch for rhabdomyolysis, eosinophilia, pneumonia Adjust Daptomycin frequency if crcl< 30 Labs - Once weekly:  CBC/D, CMP, and CPK Labs - Once weekly: ESR and CRP Fax weekly lab results  promptly to [phone] Please pull PIC at completion of IV antibiotics Method of administration: IV Push Method of administration may be changed at the discretion of home infusion pharmacist based upon assessment of the patient and/or caregiver's ability to self-administer the medication ordered. 35 Units 0   FIASP 100 UNIT/ML SOLN Inject into  the skin continuous. Via pump     HYDROcodone-acetaminophen (NORCO) 10-325 MG tablet Take 1 tablet by mouth every 6 (six) hours as needed for moderate pain or severe pain. 30 tablet 0   iron polysaccharides (NIFEREX) 150 MG capsule Take 1 capsule (150 mg total) by mouth daily. 30 capsule 0   nicotine (NICODERM CQ - DOSED IN MG/24 HOURS) 14 mg/24hr patch Place 1 patch (14 mg total) onto the skin daily. 28 patch 0   ondansetron (ZOFRAN) 4 MG tablet Take 1 tablet (4 mg total) by mouth every 8 (eight) hours as needed for nausea or vomiting. 20 tablet 1   HYDROmorphone (DILAUDID) 2 MG tablet Take 1 tablet (2 mg total) by mouth every 8 (eight) hours. (Patient not taking: Reported on 09/06/2023) 16 tablet 0   No current facility-administered medications for this visit.    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   BUN 31 (H) 6 - 20 mg/dL   Creatinine, Ser 1.02 (H) 0.61 - 1.24 mg/dL   Calcium 8.5 (L) 8.9 - 10.3 mg/dL   GFR, Estimated >72 >53 mL/min    Comment: (NOTE) Calculated using the CKD-EPI Creatinine Equation (2021)    Anion gap 9 5 - 15    Comment: Performed at [facility], [address] [place], [place] [address]  Glucose, capillary     Status: Abnormal   Collection Time: 08/30/23  8:25 AM  Result Value Ref Range   Glucose-Capillary 66 (L) 70 - 99 mg/dL    Comment: Glucose reference range applies only to samples taken after fasting for at least 8 hours.  Glucose, capillary     Status: None   Collection Time: 08/30/23  8:55 AM  Result Value Ref Range   Glucose-Capillary 97 70 - 99 mg/dL    Comment: Glucose reference range applies only to samples taken after fasting for at least 8 hours.  Glucose, capillary     Status: Abnormal   Collection Time: 08/30/23 11:22 AM  Result Value Ref Range   Glucose-Capillary 118 (H) 70 - 99 mg/dL    Comment: Glucose reference range applies only to samples taken after fasting for at least 8 hours.    Radiology ECHOCARDIOGRAM COMPLETE  Result Date: 08/28/2023    ECHOCARDIOGRAM REPORT   Patient Name:   Richard Mendoza Date of Exam: 08/28/2023 Medical Rec #:  [id number]      Height:       70.0 in Accession #:    [id number]     Weight:       193.3 lb Date of Birth:  05/23/1976      BSA:          2.058 m Patient Age:    47 years       BP:           188/96 mmHg Patient Gender: M              HR:           73 bpm. Exam Location:  [facility] Procedure: 2D Echo, Cardiac Doppler and Color Doppler Indications:     Endocarditis I38  History:         Patient has prior history of Echocardiogram examinations, most                  recent 04/04/2021. Risk Factors:Hypertension and Diabetes.  Sonographer:     Cristela Blue Referring Phys:   [id number] Marrion Coy Diagnosing Phys: Yvonne Kendall MD IMPRESSIONS  1. Left ventricular ejection fraction, by estimation, is 55 to 60%. The left ventricle has normal function. The left ventricle has no regional wall motion abnormalities. There is mild left ventricular hypertrophy. Left ventricular diastolic parameters are consistent with Grade I diastolic dysfunction (impaired relaxation).  2. Right ventricular systolic function is normal. The right ventricular size is normal. Tricuspid regurgitation signal is inadequate for assessing PA pressure.  3. The mitral valve is normal in structure. Trivial mitral valve regurgitation. No evidence of mitral stenosis.  4. The aortic valve  MRN : [id number]  Richard Mendoza is a 47 y.o. (06-28-1976) male who presents with chief complaint of  Chief Complaint  Patient presents with   Follow-up    Checking left stump  .  History of Present Illness: Patient returns today in follow up of left below-knee amputation stump.  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  Ideally, he would stay off the leg and not use a prosthesis until this wound is completely healed.  The infectious component seems much better.  His shoulder is also doing better after having a septic joint and having this washed out.  Current Outpatient Medications  Medication Sig Dispense Refill   aspirin EC 81 MG tablet Take 1 tablet (81 mg total) by mouth daily. Swallow whole.     atorvastatin (LIPITOR) 40 MG tablet Take 1 tablet (40 mg total) by mouth daily. 30 tablet 0   benazepril (LOTENSIN) 20 MG tablet Take 1 tablet (20 mg total) by mouth daily. 30 tablet 1   [facility] SENNA 8.6 MG tablet Take 1 tablet by mouth 2 (two) times daily as needed for constipation.  5   daptomycin (CUBICIN) IVPB Inject 800 mg into the vein daily. Indication: MRSA bacteremia/osteomyelitis of stump First Dose: Yes Last Day of Therapy: 10/01/2023 While on Daptomycin closely monitor CK and watch for rhabdomyolysis, eosinophilia, pneumonia Adjust Daptomycin frequency if crcl< 30 Labs - Once weekly:  CBC/D, CMP, and CPK Labs - Once weekly: ESR and CRP Fax weekly lab results  promptly to [phone] Please pull PIC at completion of IV antibiotics Method of administration: IV Push Method of administration may be changed at the discretion of home infusion pharmacist based upon assessment of the patient and/or caregiver's ability to self-administer the medication ordered. 35 Units 0   FIASP 100 UNIT/ML SOLN Inject into  the skin continuous. Via pump     HYDROcodone-acetaminophen (NORCO) 10-325 MG tablet Take 1 tablet by mouth every 6 (six) hours as needed for moderate pain or severe pain. 30 tablet 0   iron polysaccharides (NIFEREX) 150 MG capsule Take 1 capsule (150 mg total) by mouth daily. 30 capsule 0   nicotine (NICODERM CQ - DOSED IN MG/24 HOURS) 14 mg/24hr patch Place 1 patch (14 mg total) onto the skin daily. 28 patch 0   ondansetron (ZOFRAN) 4 MG tablet Take 1 tablet (4 mg total) by mouth every 8 (eight) hours as needed for nausea or vomiting. 20 tablet 1   HYDROmorphone (DILAUDID) 2 MG tablet Take 1 tablet (2 mg total) by mouth every 8 (eight) hours. (Patient not taking: Reported on 09/06/2023) 16 tablet 0   No current facility-administered medications for this visit.    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   BUN 31 (H) 6 - 20 mg/dL   Creatinine, Ser 1.02 (H) 0.61 - 1.24 mg/dL   Calcium 8.5 (L) 8.9 - 10.3 mg/dL   GFR, Estimated >72 >53 mL/min    Comment: (NOTE) Calculated using the CKD-EPI Creatinine Equation (2021)    Anion gap 9 5 - 15    Comment: Performed at [facility], [address] [place], [place] [address]  Glucose, capillary     Status: Abnormal   Collection Time: 08/30/23  8:25 AM  Result Value Ref Range   Glucose-Capillary 66 (L) 70 - 99 mg/dL    Comment: Glucose reference range applies only to samples taken after fasting for at least 8 hours.  Glucose, capillary     Status: None   Collection Time: 08/30/23  8:55 AM  Result Value Ref Range   Glucose-Capillary 97 70 - 99 mg/dL    Comment: Glucose reference range applies only to samples taken after fasting for at least 8 hours.  Glucose, capillary     Status: Abnormal   Collection Time: 08/30/23 11:22 AM  Result Value Ref Range   Glucose-Capillary 118 (H) 70 - 99 mg/dL    Comment: Glucose reference range applies only to samples taken after fasting for at least 8 hours.    Radiology ECHOCARDIOGRAM COMPLETE  Result Date: 08/28/2023    ECHOCARDIOGRAM REPORT   Patient Name:   Richard Mendoza Date of Exam: 08/28/2023 Medical Rec #:  [id number]      Height:       70.0 in Accession #:    [id number]     Weight:       193.3 lb Date of Birth:  05/23/1976      BSA:          2.058 m Patient Age:    47 years       BP:           188/96 mmHg Patient Gender: M              HR:           73 bpm. Exam Location:  [facility] Procedure: 2D Echo, Cardiac Doppler and Color Doppler Indications:     Endocarditis I38  History:         Patient has prior history of Echocardiogram examinations, most                  recent 04/04/2021. Risk Factors:Hypertension and Diabetes.  Sonographer:     Cristela Blue Referring Phys:   [id number] Marrion Coy Diagnosing Phys: Yvonne Kendall MD IMPRESSIONS  1. Left ventricular ejection fraction, by estimation, is 55 to 60%. The left ventricle has normal function. The left ventricle has no regional wall motion abnormalities. There is mild left ventricular hypertrophy. Left ventricular diastolic parameters are consistent with Grade I diastolic dysfunction (impaired relaxation).  2. Right ventricular systolic function is normal. The right ventricular size is normal. Tricuspid regurgitation signal is inadequate for assessing PA pressure.  3. The mitral valve is normal in structure. Trivial mitral valve regurgitation. No evidence of mitral stenosis.  4. The aortic valve  MRN : [id number]  Richard Mendoza is a 47 y.o. (06-28-1976) male who presents with chief complaint of  Chief Complaint  Patient presents with   Follow-up    Checking left stump  .  History of Present Illness: Patient returns today in follow up of left below-knee amputation stump.  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  Ideally, he would stay off the leg and not use a prosthesis until this wound is completely healed.  The infectious component seems much better.  His shoulder is also doing better after having a septic joint and having this washed out.  Current Outpatient Medications  Medication Sig Dispense Refill   aspirin EC 81 MG tablet Take 1 tablet (81 mg total) by mouth daily. Swallow whole.     atorvastatin (LIPITOR) 40 MG tablet Take 1 tablet (40 mg total) by mouth daily. 30 tablet 0   benazepril (LOTENSIN) 20 MG tablet Take 1 tablet (20 mg total) by mouth daily. 30 tablet 1   [facility] SENNA 8.6 MG tablet Take 1 tablet by mouth 2 (two) times daily as needed for constipation.  5   daptomycin (CUBICIN) IVPB Inject 800 mg into the vein daily. Indication: MRSA bacteremia/osteomyelitis of stump First Dose: Yes Last Day of Therapy: 10/01/2023 While on Daptomycin closely monitor CK and watch for rhabdomyolysis, eosinophilia, pneumonia Adjust Daptomycin frequency if crcl< 30 Labs - Once weekly:  CBC/D, CMP, and CPK Labs - Once weekly: ESR and CRP Fax weekly lab results  promptly to [phone] Please pull PIC at completion of IV antibiotics Method of administration: IV Push Method of administration may be changed at the discretion of home infusion pharmacist based upon assessment of the patient and/or caregiver's ability to self-administer the medication ordered. 35 Units 0   FIASP 100 UNIT/ML SOLN Inject into  the skin continuous. Via pump     HYDROcodone-acetaminophen (NORCO) 10-325 MG tablet Take 1 tablet by mouth every 6 (six) hours as needed for moderate pain or severe pain. 30 tablet 0   iron polysaccharides (NIFEREX) 150 MG capsule Take 1 capsule (150 mg total) by mouth daily. 30 capsule 0   nicotine (NICODERM CQ - DOSED IN MG/24 HOURS) 14 mg/24hr patch Place 1 patch (14 mg total) onto the skin daily. 28 patch 0   ondansetron (ZOFRAN) 4 MG tablet Take 1 tablet (4 mg total) by mouth every 8 (eight) hours as needed for nausea or vomiting. 20 tablet 1   HYDROmorphone (DILAUDID) 2 MG tablet Take 1 tablet (2 mg total) by mouth every 8 (eight) hours. (Patient not taking: Reported on 09/06/2023) 16 tablet 0   No current facility-administered medications for this visit.    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   BUN 31 (H) 6 - 20 mg/dL   Creatinine, Ser 1.02 (H) 0.61 - 1.24 mg/dL   Calcium 8.5 (L) 8.9 - 10.3 mg/dL   GFR, Estimated >72 >53 mL/min    Comment: (NOTE) Calculated using the CKD-EPI Creatinine Equation (2021)    Anion gap 9 5 - 15    Comment: Performed at [facility], [address] [place], [place] [address]  Glucose, capillary     Status: Abnormal   Collection Time: 08/30/23  8:25 AM  Result Value Ref Range   Glucose-Capillary 66 (L) 70 - 99 mg/dL    Comment: Glucose reference range applies only to samples taken after fasting for at least 8 hours.  Glucose, capillary     Status: None   Collection Time: 08/30/23  8:55 AM  Result Value Ref Range   Glucose-Capillary 97 70 - 99 mg/dL    Comment: Glucose reference range applies only to samples taken after fasting for at least 8 hours.  Glucose, capillary     Status: Abnormal   Collection Time: 08/30/23 11:22 AM  Result Value Ref Range   Glucose-Capillary 118 (H) 70 - 99 mg/dL    Comment: Glucose reference range applies only to samples taken after fasting for at least 8 hours.    Radiology ECHOCARDIOGRAM COMPLETE  Result Date: 08/28/2023    ECHOCARDIOGRAM REPORT   Patient Name:   Richard Mendoza Date of Exam: 08/28/2023 Medical Rec #:  [id number]      Height:       70.0 in Accession #:    [id number]     Weight:       193.3 lb Date of Birth:  05/23/1976      BSA:          2.058 m Patient Age:    47 years       BP:           188/96 mmHg Patient Gender: M              HR:           73 bpm. Exam Location:  [facility] Procedure: 2D Echo, Cardiac Doppler and Color Doppler Indications:     Endocarditis I38  History:         Patient has prior history of Echocardiogram examinations, most                  recent 04/04/2021. Risk Factors:Hypertension and Diabetes.  Sonographer:     Cristela Blue Referring Phys:   [id number] Marrion Coy Diagnosing Phys: Yvonne Kendall MD IMPRESSIONS  1. Left ventricular ejection fraction, by estimation, is 55 to 60%. The left ventricle has normal function. The left ventricle has no regional wall motion abnormalities. There is mild left ventricular hypertrophy. Left ventricular diastolic parameters are consistent with Grade I diastolic dysfunction (impaired relaxation).  2. Right ventricular systolic function is normal. The right ventricular size is normal. Tricuspid regurgitation signal is inadequate for assessing PA pressure.  3. The mitral valve is normal in structure. Trivial mitral valve regurgitation. No evidence of mitral stenosis.  4. The aortic valve  MRN : [id number]  Richard Mendoza is a 47 y.o. (06-28-1976) male who presents with chief complaint of  Chief Complaint  Patient presents with   Follow-up    Checking left stump  .  History of Present Illness: Patient returns today in follow up of left below-knee amputation stump.  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  Ideally, he would stay off the leg and not use a prosthesis until this wound is completely healed.  The infectious component seems much better.  His shoulder is also doing better after having a septic joint and having this washed out.  Current Outpatient Medications  Medication Sig Dispense Refill   aspirin EC 81 MG tablet Take 1 tablet (81 mg total) by mouth daily. Swallow whole.     atorvastatin (LIPITOR) 40 MG tablet Take 1 tablet (40 mg total) by mouth daily. 30 tablet 0   benazepril (LOTENSIN) 20 MG tablet Take 1 tablet (20 mg total) by mouth daily. 30 tablet 1   [facility] SENNA 8.6 MG tablet Take 1 tablet by mouth 2 (two) times daily as needed for constipation.  5   daptomycin (CUBICIN) IVPB Inject 800 mg into the vein daily. Indication: MRSA bacteremia/osteomyelitis of stump First Dose: Yes Last Day of Therapy: 10/01/2023 While on Daptomycin closely monitor CK and watch for rhabdomyolysis, eosinophilia, pneumonia Adjust Daptomycin frequency if crcl< 30 Labs - Once weekly:  CBC/D, CMP, and CPK Labs - Once weekly: ESR and CRP Fax weekly lab results  promptly to [phone] Please pull PIC at completion of IV antibiotics Method of administration: IV Push Method of administration may be changed at the discretion of home infusion pharmacist based upon assessment of the patient and/or caregiver's ability to self-administer the medication ordered. 35 Units 0   FIASP 100 UNIT/ML SOLN Inject into  the skin continuous. Via pump     HYDROcodone-acetaminophen (NORCO) 10-325 MG tablet Take 1 tablet by mouth every 6 (six) hours as needed for moderate pain or severe pain. 30 tablet 0   iron polysaccharides (NIFEREX) 150 MG capsule Take 1 capsule (150 mg total) by mouth daily. 30 capsule 0   nicotine (NICODERM CQ - DOSED IN MG/24 HOURS) 14 mg/24hr patch Place 1 patch (14 mg total) onto the skin daily. 28 patch 0   ondansetron (ZOFRAN) 4 MG tablet Take 1 tablet (4 mg total) by mouth every 8 (eight) hours as needed for nausea or vomiting. 20 tablet 1   HYDROmorphone (DILAUDID) 2 MG tablet Take 1 tablet (2 mg total) by mouth every 8 (eight) hours. (Patient not taking: Reported on 09/06/2023) 16 tablet 0   No current facility-administered medications for this visit.    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 (06-28-1976) male who presents with chief complaint of  Chief Complaint  Patient presents with   Follow-up    Checking left stump  .  History of Present Illness: Patient returns today in follow up of left below-knee amputation stump.  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  Ideally, he would stay off the leg and not use a prosthesis until this wound is completely healed.  The infectious component seems much better.  His shoulder is also doing better after having a septic joint and having this washed out.  Current Outpatient Medications  Medication Sig Dispense Refill   aspirin EC 81 MG tablet Take 1 tablet (81 mg total) by mouth daily. Swallow whole.     atorvastatin (LIPITOR) 40 MG tablet Take 1 tablet (40 mg total) by mouth daily. 30 tablet 0   benazepril (LOTENSIN) 20 MG tablet Take 1 tablet (20 mg total) by mouth daily. 30 tablet 1   [facility]S SENNA 8.6 MG tablet Take 1 tablet by mouth 2 (two) times daily as needed for constipation.  5   daptomycin (CUBICIN) IVPB Inject 800 mg into the vein daily. Indication: MRSA bacteremia/osteomyelitis of stump First Dose: Yes Last Day of Therapy: 10/01/2023 While on Daptomycin closely monitor CK and watch for rhabdomyolysis, eosinophilia, pneumonia Adjust Daptomycin frequency if crcl< 30 Labs - Once weekly:  CBC/D, CMP, and CPK Labs - Once weekly: ESR and CRP Fax weekly lab results  promptly to [phone] Please pull PIC at completion of IV antibiotics Method of administration: IV Push Method of administration may be changed at the discretion of home infusion pharmacist based upon assessment of the patient and/or caregiver's ability to self-administer the medication ordered. 35 Units 0   FIASP 100 UNIT/ML SOLN Inject into  the skin continuous. Via pump     HYDROcodone-acetaminophen (NORCO) 10-325 MG tablet Take 1 tablet by mouth every 6 (six) hours as needed for moderate pain or severe pain. 30 tablet 0   iron polysaccharides (NIFEREX) 150 MG capsule Take 1 capsule (150 mg total) by mouth daily. 30 capsule 0   nicotine (NICODERM CQ - DOSED IN MG/24 HOURS) 14 mg/24hr patch Place 1 patch (14 mg total) onto the skin daily. 28 patch 0   ondansetron (ZOFRAN) 4 MG tablet Take 1 tablet (4 mg total) by mouth every 8 (eight) hours as needed for nausea or vomiting. 20 tablet 1   HYDROmorphone (DILAUDID) 2 MG tablet Take 1 tablet (2 mg total) by mouth every 8 (eight) hours. (Patient not taking: Reported on 09/06/2023) 16 tablet 0   No current facility-administered medications for this visit.    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   BUN 31 (H) 6 - 20 mg/dL   Creatinine, Ser 1.02 (H) 0.61 - 1.24 mg/dL   Calcium 8.5 (L) 8.9 - 10.3 mg/dL   GFR, Estimated >72 >53 mL/min    Comment: (NOTE) Calculated using the CKD-EPI Creatinine Equation (2021)    Anion gap 9 5 - 15    Comment: Performed at [facility], [address] [place], [place] [address]  Glucose, capillary     Status: Abnormal   Collection Time: 08/30/23  8:25 AM  Result Value Ref Range   Glucose-Capillary 66 (L) 70 - 99 mg/dL    Comment: Glucose reference range applies only to samples taken after fasting for at least 8 hours.  Glucose, capillary     Status: None   Collection Time: 08/30/23  8:55 AM  Result Value Ref Range   Glucose-Capillary 97 70 - 99 mg/dL    Comment: Glucose reference range applies only to samples taken after fasting for at least 8 hours.  Glucose, capillary     Status: Abnormal   Collection Time: 08/30/23 11:22 AM  Result Value Ref Range   Glucose-Capillary 118 (H) 70 - 99 mg/dL    Comment: Glucose reference range applies only to samples taken after fasting for at least 8 hours.    Radiology ECHOCARDIOGRAM COMPLETE  Result Date: 08/28/2023    ECHOCARDIOGRAM REPORT   Patient Name:   Richard Mendoza Date of Exam: 08/28/2023 Medical Rec #:  [id number]      Height:       70.0 in Accession #:    [id number]     Weight:       193.3 lb Date of Birth:  05/23/1976      BSA:          2.058 m Patient Age:    47 years       BP:           188/96 mmHg Patient Gender: M              HR:           73 bpm. Exam Location:  [facility] Procedure: 2D Echo, Cardiac Doppler and Color Doppler Indications:     Endocarditis I38  History:         Patient has prior history of Echocardiogram examinations, most                  recent 04/04/2021. Risk Factors:Hypertension and Diabetes.  Sonographer:     Cristela Blue Referring Phys:   [id number] Marrion Coy Diagnosing Phys: Yvonne Kendall MD IMPRESSIONS  1. Left ventricular ejection fraction, by estimation, is 55 to 60%. The left ventricle has normal function. The left ventricle has no regional wall motion abnormalities. There is mild left ventricular hypertrophy. Left ventricular diastolic parameters are consistent with Grade I diastolic dysfunction (impaired relaxation).  2. Right ventricular systolic function is normal. The right ventricular size is normal. Tricuspid regurgitation signal is inadequate for assessing PA pressure.  3. The mitral valve is normal in structure. Trivial mitral valve regurgitation. No evidence of mitral stenosis.  4. The aortic valve

## 2023-09-06 NOTE — Assessment & Plan Note (Signed)
Wound is stable.  Infection is better. Recheck in 4-6 weeks

## 2023-09-06 NOTE — Assessment & Plan Note (Signed)
lipid control important in reducing the progression of atherosclerotic disease. Continue statin therapy  

## 2023-09-06 NOTE — Assessment & Plan Note (Signed)
blood pressure control important in reducing the progression of atherosclerotic disease. On appropriate oral medications.  

## 2023-09-08 DIAGNOSIS — T8744 Infection of amputation stump, left lower extremity: Secondary | ICD-10-CM | POA: Diagnosis not present

## 2023-09-10 DIAGNOSIS — E109 Type 1 diabetes mellitus without complications: Secondary | ICD-10-CM | POA: Diagnosis not present

## 2023-09-10 DIAGNOSIS — B9562 Methicillin resistant Staphylococcus aureus infection as the cause of diseases classified elsewhere: Secondary | ICD-10-CM | POA: Diagnosis not present

## 2023-09-10 DIAGNOSIS — T8744 Infection of amputation stump, left lower extremity: Secondary | ICD-10-CM | POA: Diagnosis not present

## 2023-09-15 DIAGNOSIS — T8744 Infection of amputation stump, left lower extremity: Secondary | ICD-10-CM | POA: Diagnosis not present

## 2023-09-17 DIAGNOSIS — B9562 Methicillin resistant Staphylococcus aureus infection as the cause of diseases classified elsewhere: Secondary | ICD-10-CM | POA: Diagnosis not present

## 2023-09-17 DIAGNOSIS — L03116 Cellulitis of left lower limb: Secondary | ICD-10-CM | POA: Diagnosis not present

## 2023-09-19 ENCOUNTER — Telehealth (INDEPENDENT_AMBULATORY_CARE_PROVIDER_SITE_OTHER): Payer: Self-pay

## 2023-09-19 NOTE — Telephone Encounter (Signed)
Judy Pimple from [facility] would like you to contact him regarding this patient at [phone]. Per him [employer] has denied the patient's coating and/or his prosthetic leg and require a peer to peer with the person that wrote the prescription.

## 2023-09-20 DIAGNOSIS — R269 Unspecified abnormalities of gait and mobility: Secondary | ICD-10-CM | POA: Diagnosis not present

## 2023-09-20 NOTE — Telephone Encounter (Signed)
I spoke with Judy Pimple and contact the peer to peer line.  The time frame for being able to request a P2P had elapsed and he would require an appeal at this time.

## 2023-09-23 DIAGNOSIS — T8744 Infection of amputation stump, left lower extremity: Secondary | ICD-10-CM | POA: Diagnosis not present

## 2023-09-24 ENCOUNTER — Encounter: Payer: Self-pay | Admitting: Infectious Diseases

## 2023-09-24 ENCOUNTER — Ambulatory Visit: Payer: Medicaid Other | Attending: Infectious Diseases | Admitting: Infectious Diseases

## 2023-09-24 VITALS — BP 136/89 | HR 94 | Temp 97.9°F | Ht 70.0 in | Wt 184.0 lb

## 2023-09-24 DIAGNOSIS — D649 Anemia, unspecified: Secondary | ICD-10-CM | POA: Insufficient documentation

## 2023-09-24 DIAGNOSIS — B9561 Methicillin susceptible Staphylococcus aureus infection as the cause of diseases classified elsewhere: Secondary | ICD-10-CM | POA: Insufficient documentation

## 2023-09-24 DIAGNOSIS — Z794 Long term (current) use of insulin: Secondary | ICD-10-CM | POA: Diagnosis not present

## 2023-09-24 DIAGNOSIS — R7881 Bacteremia: Secondary | ICD-10-CM | POA: Insufficient documentation

## 2023-09-24 DIAGNOSIS — E1122 Type 2 diabetes mellitus with diabetic chronic kidney disease: Secondary | ICD-10-CM | POA: Diagnosis not present

## 2023-09-24 DIAGNOSIS — Z89512 Acquired absence of left leg below knee: Secondary | ICD-10-CM | POA: Insufficient documentation

## 2023-09-24 DIAGNOSIS — Y848 Other medical procedures as the cause of abnormal reaction of the patient, or of later complication, without mention of misadventure at the time of the procedure: Secondary | ICD-10-CM | POA: Diagnosis not present

## 2023-09-24 DIAGNOSIS — I129 Hypertensive chronic kidney disease with stage 1 through stage 4 chronic kidney disease, or unspecified chronic kidney disease: Secondary | ICD-10-CM | POA: Diagnosis not present

## 2023-09-24 DIAGNOSIS — T8744 Infection of amputation stump, left lower extremity: Secondary | ICD-10-CM | POA: Diagnosis present

## 2023-09-24 MED ORDER — CHLORHEXIDINE GLUCONATE 4 % EX SOLN: Freq: Every day | CUTANEOUS | 0 refills | Status: DC | PRN

## 2023-09-24 MED ORDER — MUPIROCIN 2 % EX OINT: 1.0000 | TOPICAL_OINTMENT | Freq: Two times a day (BID) | CUTANEOUS | 0 refills | Status: DC

## 2023-09-24 MED ORDER — CEFADROXIL 500 MG PO CAPS: 500.0000 mg | ORAL_CAPSULE | Freq: Two times a day (BID) | ORAL | 0 refills | Status: DC

## 2023-09-24 NOTE — Progress Notes (Unsigned)
NAME: Richard Mendoza  DOB: 26-Nov-1975  MRN: [id number]  Date/Time: 09/24/2023 11:32 AM  Subjective:  Discussed the use of AI scribe software for clinical note transcription with the patient, who gave verbal consent to proceed.  History of Present Illness          ? Richard Mendoza is a 47 y.o. 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 The patient reports that the stump infection has healed completely and the prosthetic specialist has also confirmed the same. The patient also reports that the shoulder infection has improved significantly after a washout procedure and antibiotic treatment. The patient also mentions that he has quit smoking recently and is trying to manage his blood glucose levels better. The patient also reports job loss due to the inability to work because of the leg amputation and subsequent issues. The patient is trying to apply for disability.   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;  Surgeon: Linus Galas, DPM;  Location: [facility];  Service: Podiatry;  Laterality: Left;   AMPUTATION Left 11/16/2020   Procedure: AMPUTATION BELOW KNEE;  Surgeon: Annice Needy, MD;  Location: [facility];  Service: Vascular;  Laterality: Left;   ANTERIOR CERVICAL  DECOMP/DISCECTOMY FUSION N/A 09/09/2017   Procedure: ANTERIOR CERVICAL DECOMPRESSION/DISCECTOMY FUSION CERVICAL 6- CERVICAL 7;  Surgeon: Coletta Memos, MD;  Location: [facility];  Service: Neurosurgery;  Laterality: N/A;  ANTERIOR CERVICAL DECOMPRESSION/DISCECTOMY FUSION CERVICAL 6- CERVICAL 7   APPENDECTOMY     I & D EXTREMITY Right 10/03/2017   Procedure: IRRIGATION AND DEBRIDEMENT RIGHT WRIST;  Surgeon: Betha Loa, MD;  Location: [facility] OR;  Service: Orthopedics;  Laterality: Right;   I & D EXTREMITY Right 11/26/2018   Procedure: IRRIGATION AND DEBRIDEMENT FASCIA ON RIGHT FOOT;  Surgeon: Gwyneth Revels, DPM;  Location: [facility];  Service: Podiatry;  Laterality: Right;   INCISION AND DRAINAGE Right 03/06/2019   Procedure: INCISION AND DRAINAGE RIGHT FOOT, WITH 4th RAY AMPUTATION;  Surgeon: Gwyneth Revels, DPM;  Location: [facility];  Service: Podiatry;  Laterality: Right;   INCISION AND DRAINAGE Left 11/07/2020   Procedure: INCISION AND DRAINAGE;  Surgeon: Linus Galas, DPM;  Location: [facility];  Service: Podiatry;  Laterality: Left;   INCISION AND DRAINAGE ABSCESS Left 05/02/2023   Procedure: INCISION AND DRAINAGE ABSCESS LEFT LOWER EXTREMITY;  Surgeon: Annice Needy, MD;  Location: [facility];  Service: Vascular;  Laterality: Left;   IRRIGATION AND DEBRIDEMENT FOOT Left 11/11/2020   Procedure: IRRIGATION AND DEBRIDEMENT FOOT;  Surgeon: Linus Galas, DPM;  Location: [facility];  Service: Podiatry;  Laterality: Left;   METATARSAL HEAD EXCISION Right 05/15/2019   Procedure: OSTECTOMY;MET HEAD 5;  Surgeon: Gwyneth Revels, DPM;  Location: [facility];  Service: Podiatry;  Laterality: Right;   osteomylitis  ROTATOR CUFF REPAIR Left    SHOULDER ARTHROSCOPY WITH DEBRIDEMENT AND BICEP TENDON REPAIR Right 08/27/2023   Procedure: SHOULDER ARTHROSCOPY WITH INCISION AND DRAINAGE;  Surgeon: Signa Kell, MD;  Location: [facility];  Service: Orthopedics;  Laterality: Right;    Social History   Socioeconomic History   Marital  status: Married    Spouse name: Not on file   Number of children: 4   Years of education: Not on file   Highest education level: Not on file  Occupational History   Not on file  Tobacco Use   Smoking status: Some Days    Current packs/day: 0.00    Average packs/day: 1 pack/day for 17.0 years (17.0 ttl pk-yrs)    Types: Cigarettes    Start date: 01/14/2002    Last attempt to quit: 01/14/2019    Years since quitting: 4.6   Smokeless tobacco: Never  Vaping Use   Vaping status: Never Used  Substance and Sexual Activity   Alcohol use: Yes    Comment: rare   Drug use: No   Sexual activity: Yes  Other Topics Concern   Not on file  Social History Narrative   Not on file   Social Determinants of Health   Financial Resource Strain: Low Risk  (07/24/2022)   Received from [facility], [facility]   Overall Financial Resource Strain (CARDIA)    Difficulty of Paying Living Expenses: Not hard at all  Food Insecurity: No Food Insecurity (06/01/2023)   Hunger Vital Sign    Worried About Running Out of Food in the Last Year: Never true    Ran Out of Food in the Last Year: Never true  Transportation Needs: No Transportation Needs (06/01/2023)   PRAPARE - Administrator, Civil Service (Medical): No    Lack of Transportation (Non-Medical): No  Physical Activity: Not on file  Stress: No Stress Concern Present (07/24/2022)   Received from [facility], [facility] - Occupational Stress Questionnaire    Feeling of Stress : Not at all  Social Connections: Unknown (03/21/2022)   Received from [facility], [facility]   Social Network    Social Network: Not on file  Intimate Partner Violence: Not At Risk (06/01/2023)   Humiliation, Afraid, Rape, and Kick questionnaire    Fear of Current or Ex-Partner: No    Emotionally Abused: No    Physically Abused: No    Sexually Abused: No    Family History  Problem Relation Age of Onset    Diabetes Mother    Heart disease Father    Diabetes Father    Arthritis Other    Hyperlipidemia Other    Hypertension Other    Cancer Other        breast   Mental illness Neg Hx    Allergies  Allergen Reactions   Oxycodone Itching   I? Current Outpatient Medications  Medication Sig Dispense Refill   aspirin EC 81 MG tablet Take 1 tablet (81 mg total) by mouth daily. Swallow whole.     benazepril (LOTENSIN) 20 MG tablet Take 1 tablet (20 mg total) by mouth daily. 30 tablet 1   Continuous Glucose Sensor (DEXCOM G6 SENSOR) MISC SMARTSIG:Topical Every 10 Days     Continuous Glucose Transmitter (DEXCOM G6 TRANSMITTER) MISC      [employer] SENNA 8.6 MG tablet Take 1 tablet by mouth 2 (two) times daily as needed for constipation.  5   daptomycin (CUBICIN)  IVPB Inject 800 mg into the vein daily. Indication: MRSA bacteremia/osteomyelitis of stump First Dose: Yes Last Day of Therapy: 10/01/2023 While on Daptomycin closely monitor CK and watch for rhabdomyolysis, eosinophilia, pneumonia Adjust Daptomycin frequency if crcl< 30 Labs - Once weekly:  CBC/D, CMP, and CPK Labs - Once weekly: ESR and CRP Fax weekly lab results  promptly to [phone] Please pull PIC at completion of IV antibiotics Method of administration: IV Push Method of administration may be changed at the discretion of home infusion pharmacist based upon assessment of the patient and/or caregiver's ability to self-administer the medication ordered. 35 Units 0   fenofibrate (TRICOR) 48 MG tablet Take 48 mg by mouth daily.     FIASP 100 UNIT/ML SOLN Inject into the skin continuous. Via pump     Insulin Disposable Pump (OMNIPOD 5 [id number] PODS GEN 5) MISC SMARTSIG:1 Device SUB-Q Every Other Day     iron polysaccharides (NIFEREX) 150 MG capsule Take 1 capsule (150 mg total) by mouth daily. 30 capsule 0   ondansetron (ZOFRAN) 4 MG tablet Take 1 tablet (4 mg total) by mouth every 8 (eight) hours as needed for nausea or vomiting. 20  tablet 1   pantoprazole (PROTONIX) 40 MG tablet Take 40 mg by mouth daily.     traZODone (DESYREL) 100 MG tablet Take 100 mg by mouth at bedtime.     No current facility-administered medications for this visit.     Abtx:  Anti-infectives (From admission, onward)    None       REVIEW OF SYSTEMS:  Const: negative fever, negative chills, negative weight loss Eyes: negative diplopia or visual changes, negative eye pain ENT: negative coryza, negative sore throat Resp: negative cough, hemoptysis, dyspnea Cards: negative for chest pain, palpitations, lower extremity edema GU: negative for frequency, dysuria and hematuria GI: Negative for abdominal pain, diarrhea, bleeding, constipation Skin: negative for rash and pruritus Heme: negative for easy bruising and gum/nose bleeding MS: rt shoulder stiffness Neurolo:negative for headaches, dizziness, vertigo, memory problems  Psych: negative for feelings of anxiety, depression  Endocrine:  diabetes Allergy/Immunology- oxycodone Objective:  VITALS:  BP 136/89   Pulse 94   Temp 97.9 F (36.6 C) (Temporal)   Ht 5\' 10"  (1.778 m)   Wt 184 lb (83.5 kg)   BMI 26.40 kg/m  LDA Picc line PHYSICAL EXAM:  General: Alert, cooperative, no distress, appears stated age.  Head: Normocephalic, without obvious abnormality, atraumatic. Eyes: Conjunctivae clear, anicteric sclerae. Pupils are equal ENT Nares normal. No drainage or sinus tenderness. Lips, mucosa, and tongue normal. No Thrush Neck: Supple, symmetrical, no adenopathy, thyroid: non tender no carotid bruit and no JVD. Back: No CVA tenderness. Lungs: Clear to auscultation bilaterally. No Wheezing or Rhonchi. No rales. Heart: Regular rate and rhythm, no murmur, rub or gallop. Abdomen: Soft, non-tender,not distended. Bowel sounds normal. No masses Extremities: left prosthetic leg Skin: No rashes or lesions. Or bruising Lymph: Cervical, supraclavicular normal. Neurologic: Grossly  non-focal Pertinent Labs  On 09/17/2023 CK 88 CRP 32 was higher than 1 week before that ESR 67 was 58 a week before Creatinine 1.46 was 1.61 a week before Hemoglobin 9 was 8 before   ? Impression/Recommendation ?Stump Infection Resolved. Prosthesis causing friction and recurrent infections. Patient is working on disability claim to reduce activity and prevent future infections. -Continue current care plan.  Shoulder Infection Suspected infection, no growth on culture likely due to concurrent antibiotic treatment for stump infection. -Continue IV antibiotics until 10/01/2023, then switch  to oral antibiotics ( cefadroxil 500mg  PO BID for 2 weeks) for a couple of weeks to ensure complete resolution.  Anemia Hemoglobin improved from 8 to 9,  -Eat iron-rich foods and consider over-the-counter iron supplement.  Chronic Kidney Disease Improved. -Continue current care plan and follow up with nephrologist.  General Health Maintenance -Quit smoking, continue abstaining. -Continue blood pressure and cholesterol medications. -Check kidney function and inflammatory markers in two weeks. ?  ________________________________________________ Discussed with patient, in detail Note:  This document was prepared using Dragon voice recognition software and may include unintentional dictation errors.

## 2023-09-24 NOTE — Patient Instructions (Signed)
ou visited [place] today to follow up on your recent hospital admission for a stump infection and other related health issues. Your stump infection has healed completely, and your shoulder infection has improved significantly. You have also made positive lifestyle changes, such as quitting smoking and managing your blood glucose levels better. We discussed your job loss and your efforts to apply for disability benefits.  YOUR PLAN:  -STUMP INFECTION: Your stump infection has resolved, but your prosthesis is causing friction and recurrent infections. You are working on a disability claim to reduce activity and prevent future infections. Please continue with your current care plan.  -SHOULDER INFECTION: You had a suspected shoulder infection, which has improved significantly after a washout procedure and antibiotic treatment. Continue IV antibiotics until October 01, 2023, and then switch to oral antibiotics for a couple of weeks to ensure complete resolution.  -ANEMIA: Your hemoglobin levels have improved from 8 to 9, likely due to the resolution of your infections. Please eat iron-rich foods and consider taking an over-the-counter iron supplement.  -CHRONIC KIDNEY DISEASE: Your chronic kidney disease has shown improvement. Continue with your current care plan and follow up with your nephrologist.  -GENERAL HEALTH MAINTENANCE: You have successfully quit smoking; please continue to abstain. Continue taking your blood pressure and cholesterol medications. We will check your kidney function and inflammatory markers in two weeks.  INSTRUCTIONS:  Please continue with your current care plan for your stump and shoulder infections. Follow up with your nephrologist for your chronic kidney disease. We will check your kidney function and inflammatory markers in two weeks. Continue to eat iron-rich foods and consider an over-the-counter iron supplement for anemia. Keep up the good work with managing your blood  glucose levels and staying smoke-free.

## 2023-09-25 NOTE — Therapy (Unsigned)
OUTPATIENT PHYSICAL THERAPY UPPER EXTREMITY EVALUATION   Patient Name: Richard Mendoza MRN: [id number] DOB:[birth year], 47 y.o., male Today's Date: 09/26/2023  END OF SESSION:  PT End of Session - 09/26/23 0750     Visit Number 1    Date for PT Re-Evaluation 12/05/23    PT Start Time 0750    PT Stop Time 0830    PT Time Calculation (min) 40 min    Activity Tolerance Patient tolerated treatment well    Behavior During Therapy [facility] for tasks assessed/performed             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   Past Surgical History:  Procedure Laterality Date   AMPUTATION Left 11/07/2020   Procedure: 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;  Surgeon: Linus Galas, DPM;  Location: [facility];  Service: Podiatry;  Laterality: Left;   AMPUTATION Left 11/16/2020   Procedure: AMPUTATION BELOW KNEE;  Surgeon: Annice Needy, MD;  Location: [facility];  Service: Vascular;  Laterality: Left;   ANTERIOR CERVICAL DECOMP/DISCECTOMY FUSION N/A 09/09/2017   Procedure: ANTERIOR CERVICAL DECOMPRESSION/DISCECTOMY FUSION CERVICAL 6- CERVICAL 7;  Surgeon: Coletta Memos, MD;  Location: [facility];  Service: Neurosurgery;  Laterality: N/A;  ANTERIOR CERVICAL DECOMPRESSION/DISCECTOMY FUSION CERVICAL 6- CERVICAL 7   APPENDECTOMY     I & D EXTREMITY Right 10/03/2017   Procedure: IRRIGATION AND DEBRIDEMENT RIGHT WRIST;  Surgeon: Betha Loa, MD;  Location: [facility];  Service: Orthopedics;  Laterality: Right;   I & D EXTREMITY Right 11/26/2018   Procedure: IRRIGATION AND DEBRIDEMENT FASCIA ON RIGHT FOOT;  Surgeon: Gwyneth Revels, DPM;  Location: [facility];  Service: Podiatry;  Laterality: Right;   INCISION AND DRAINAGE Right 03/06/2019   Procedure: INCISION AND DRAINAGE RIGHT FOOT, WITH 4th RAY AMPUTATION;  Surgeon: Gwyneth Revels, DPM;  Location: [facility];   Service: Podiatry;  Laterality: Right;   INCISION AND DRAINAGE Left 11/07/2020   Procedure: INCISION AND DRAINAGE;  Surgeon: Linus Galas, DPM;  Location: [facility];  Service: Podiatry;  Laterality: Left;   INCISION AND DRAINAGE ABSCESS Left 05/02/2023   Procedure: INCISION AND DRAINAGE ABSCESS LEFT LOWER EXTREMITY;  Surgeon: Annice Needy, MD;  Location: [facility];  Service: Vascular;  Laterality: Left;   IRRIGATION AND DEBRIDEMENT FOOT Left 11/11/2020   Procedure: IRRIGATION AND DEBRIDEMENT FOOT;  Surgeon: Linus Galas, DPM;  Location: [facility];  Service: Podiatry;  Laterality: Left;   METATARSAL HEAD EXCISION Right 05/15/2019   Procedure: OSTECTOMY;MET HEAD 5;  Surgeon: Gwyneth Revels, DPM;  Location: [facility];  Service: Podiatry;  Laterality: Right;   osteomylitis     ROTATOR CUFF REPAIR Left    SHOULDER ARTHROSCOPY WITH DEBRIDEMENT AND BICEP TENDON REPAIR Right 08/27/2023   Procedure: SHOULDER ARTHROSCOPY WITH INCISION AND DRAINAGE;  Surgeon: Signa Kell, MD;  Location: [facility];  Service: Orthopedics;  Laterality: Right;   Patient Active Problem List   Diagnosis Date Noted   Staphylococcal arthritis of right shoulder (HCC) 08/29/2023   Bacteremia due to Staphylococcus aureus 08/29/2023   Acute bursitis of right shoulder 08/24/2023   Iron deficiency anemia, unspecified 08/23/2023   Overweight (BMI 25.0-29.9) 08/22/2023   MRSA bacteremia 08/22/2023   Amputation stump infection (HCC) 08/20/2023   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) 06/01/2023   Chronic kidney disease, stage 3a (HCC) 06/01/2023   Tobacco  abuse 06/01/2023   Cellulitis and abscess of left leg 04/30/2023   Cellulitis of left lower extremity 04/29/2023   Tobacco use 04/29/2023   Carpal tunnel syndrome 07/12/2022   Hyperglycemia due to type 2 diabetes mellitus (HCC) 07/12/2022   Polyneuropathy 07/12/2022   Ulnar neuropathy 07/12/2022   Acute epigastric pain 07/12/2022   Gastro-esophageal reflux disease  without esophagitis 07/12/2022   Nausea and vomiting 07/12/2022   Abscess of right upper extremity 02/26/2022   Cellulitis of left lower extremity without foot    Sepsis (HCC) 09/08/2021   Hypokalemia 09/06/2021   Hyponatremia 09/06/2021   PAD (peripheral artery disease) (HCC) 05/26/2021   Acute kidney injury superimposed on chronic kidney disease (HCC) 04/03/2021   Syncope 04/03/2021   Neck pain    Abnormal weight loss 12/09/2020   Change in bowel habit 12/09/2020   Constipation 12/09/2020   Epigastric pain 12/09/2020   Drug-induced constipation 12/09/2020   Periumbilical pain 12/09/2020   Fatty liver 12/09/2020   S/P BKA (below knee amputation), left (HCC) 11/20/2020   Necrotizing fasciitis of ankle and foot (HCC) 11/07/2020   History of 2019 novel coronavirus disease (COVID-19) 09/01/2020   Major depressive disorder, recurrent, in remission (HCC) 09/01/2020   Pain management contract signed 09/07/2019   Evaluation by psychiatric service required 08/18/2019   History of depression 08/18/2019   Primary osteoarthritis of both shoulders 07/16/2019   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) 07/16/2019   Chronic pain of both shoulders 07/16/2019   Cervical radicular pain 07/16/2019   S/P cervical spinal fusion 07/16/2019   Cervical spondylosis 07/16/2019   Cervical facet joint syndrome 07/16/2019   Chronic pain syndrome 07/16/2019   Anemia of chronic disease 05/25/2019   Osteomyelitis (HCC) 05/25/2019   Diabetic foot infection (HCC) 03/05/2019   Type 2 diabetes mellitus without complication (HCC) 01/01/2019   Abscess 11/25/2018   Diabetic peripheral neuropathy associated with type 2 diabetes mellitus (HCC) 06/16/2018   Hyperlipidemia associated with type 2 diabetes mellitus (HCC) 06/16/2018   Intractable nausea and vomiting 10/21/2017   HNP (herniated nucleus pulposus), cervical 09/09/2017   Displacement of cervical intervertebral disc without myelopathy 08/27/2017    Erectile dysfunction 07/31/2017   Gastroesophageal reflux disease 07/31/2017   Rotator cuff syndrome of left shoulder 07/31/2017   Cellulitis of right leg 07/30/2016   Tobacco abuse disorder 07/30/2016   Routine general medical examination at a health care facility 01/20/2014   Diabetic neuropathy, type I diabetes mellitus (HCC) 01/20/2014   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   HLD (hyperlipidemia) 03/17/2009   DM type 2 with diabetic peripheral neuropathy (HCC) 03/02/2009   Uncontrolled diabetes mellitus with hyperglycemia (HCC) 12/08/2008   OBSTRUCTIVE SLEEP APNEA 12/08/2008   Essential hypertension 12/08/2008   Allergic rhinitis 12/15/2007   Other malaise and fatigue 12/15/2007    PCP: Barbette Reichmann, MD   REFERRING PROVIDER: Novella Olive, MD   REFERRING DIAG: osteomyelitis  THERAPY DIAG:  Abnormal posture  Difficulty in walking, not elsewhere classified  Muscle weakness (generalized)  Unsteadiness on feet  Acute pain of right shoulder  Stiffness of right shoulder, not elsewhere classified  Rationale for Evaluation and Treatment: Rehabilitation  ONSET DATE: 08/29/23  SUBJECTIVE:   SUBJECTIVE STATEMENT: Patient reports that he had an infection in his tibia which spread to his R shoulder. He reports that he has recently been cleared for limited WB through his prosthesis and cleared for shoulder rehab.  PERTINENT HISTORY: Per referring physician note: Hospital Course: 47yo with medical history significant of uncontrolled  type 2 diabetes, tobacco abuse, hypertension, hyperlipidemia, and history of BKA who presented on 10/1 with diabetic stump infection.  Patient is seen by vascular surgery, started on broad-spectrum antibiotics with cefepime, Flagyl and vancomycin.  MRI of the left lower extremity showed soft tissue abscess, tibial osteomyelitis, knee joint effusion. Blood culture 1 set positive for Staph aureus, epidermidis and strep agalactiae.  Patient seen by  ID, treated with vancomycin then changed to daptomycin.  Patient also found to have significant bursitis in the right shoulder MRI, joint aspiration consistent with septic joint.  Joint washout on 10/8. Acute on chronic kidney disease, uncontrolled DM2, reflux, tobacco abuse, anemia-chronic, HTN, hyperlipidemia Per Orthopedic surgeon note F/U 09/10/23: Impression: Status post arthroscopy of right shoulder [Z98.890] Status post right shoulder irrigation and debridement for septic shoulder (primary encounter diagnosis)  Plan:  1. Continue with physical therapy. Discontinue sling. Progress with range of motion exercises. Continue with IV antibiotics and follow-up with ID. Follow-up with Dr. Allena Katz in 4 weeks for 6-week postop check. This note was generated in part with voice recognition software and I apologize for any typographical errors that were not detected and corrected.    PAIN:  Are you having pain? Patient denies pain, but reports some soreness after using his R arm  PRECAUTIONS: Fall  RED FLAGS: None   WEIGHT BEARING RESTRICTIONS: No  FALLS:  Has patient fallen in last 6 months? No  LIVING ENVIRONMENT: Lives with: lives with their family Lives in: House/apartment Stairs: No ramped entrance Has following equipment at home: Dan Humphreys - 2 wheeled and Wheelchair (manual)  OCCUPATION: Not at this time.  PLOF: Independent  PATIENT GOALS: Patient would like to recover the strength and ROM in his R shoulder to be able to use it more functionally. Right now it is too weak to use normally  NEXT MD VISIT: Allena Katz- around 3-5 weeks  OBJECTIVE:  Note: Objective measures were completed at Evaluation unless otherwise noted.  DIAGNOSTIC FINDINGS:  MRI L knee 08/20/23 IMPRESSION: 1. New small soft tissue abscess extending inferiorly from the tibial stump. 2. Significant interval increase in T2 hyperintensity and enhancement throughout the remaining tibia, highly suspicious  for osteomyelitis. 3. Enlarging knee joint effusion with mild synovial enhancement, nonspecific. Cannot exclude early septic arthritis. No evidence of osteomyelitis within the distal femur, patella or proximal fibula.  R shoulder MRI 08/22/23 IMPRESSION: 1. Prominent distal anterior supraspinatus tendinopathy with partial thickness articular surface tearing extending towards but not definitely reaching the bursal surface. 2. Moderate to prominent subscapularis and mild infraspinatus tendinopathy. 3. Moderate tendinopathy of the intra-articular segment of the long head of the biceps. 4. Moderate degenerative arthropathy of the [facility] joint. 5. Extensive fluid in the subacromial subdeltoid bursa compatible with bursitis. 6. Substantial joint effusion with fluid and potentially synovitis in the rotator interval. 7. Abnormal intramuscular edema anteriorly and laterally in the deltoid muscle, and along the periphery of the posterior deltoid. Appearance favors muscle strain/tear.  COGNITION: Overall cognitive status: Within functional limits for tasks assessed     SENSATION: Patient reports N & T in R forearm and 4th and 5th digits.  EDEMA:  Patient denies swelling  POSTURE: rounded shoulders and R scapula held in depression and abd and slight downward rotation  PALPATION: TTP medial scap border, supraspinatus tendon, deltoid  UPPER EXTREMITY ROM: L WNL, R shoulder AROM-flex 43, abd 67, ER 15, IR WNL, B elbows lack full ext   UPPER EXTREMITY MMT: LUE WNL, R shoulder- 2+/5, elbow, wrist, hand 4/5,  Shoulder shrug 5/5 B, able to activate scapular stabilizers.   UPPER EXTREMITY SPECIAL TESTS:  Full can, empty can drop arm all (+), belly press, lift off (+), additional testing deferred due to inability to assume test position  JOINT MOBILITY TESTING: Scapular mobility WNL, GH joint severely restricted in all planes  FUNCTIONAL TESTS:  Quick Dash- TBD  GAIT: Distance walked: In  clinic distances Assistive device utilized: None Level of assistance: Modified independence Comments: Wearing his old prosthesis which is partially broken. Told to limit time on the limb. He is getting a new prosthesis in about a week.   TODAY'S TREATMENT:                                                                                                                              DATE:  09/26/23 Education  PATIENT EDUCATION:  Education details: POC Person educated: Patient Education method: Explanation Education comprehension: verbalized understanding  HOME EXERCISE PROGRAM: Verbally instructed in B scap retraction, seated press ups, wall slides. [id number]  ASSESSMENT:  CLINICAL IMPRESSION: Patient is a 47 y.o. who was seen today for physical therapy evaluation and treatment for L Residual limb osteomyelitis, R shoulder bursitis, multiple tendinopathies, and sepsis. Evaluation for LLE deferred as patient has some limits on the amount of WBing and is also awaiting arrival of new prosthesis. His current one is partially broken. R shoulder has been cleared for therapy. He presents with severe weakness and tightness in the R shoulder, affecting his ability to utilize the RUE functionally. He will benefit from PT to address his deficits in the R shoulder and to facilitate improved functional use of the limb. He will also benefit from further assessment of his LLE once he has been cleared, in order to ensure he has adequate strength and flexibility to safely utilize the limb functionally.  OBJECTIVE IMPAIRMENTS: decreased activity tolerance, decreased balance, decreased mobility, difficulty walking, decreased ROM, decreased strength, impaired flexibility, impaired UE functional use, postural dysfunction, and pain.   ACTIVITY LIMITATIONS: carrying, lifting, bending, standing, stairs, transfers, and locomotion level  PARTICIPATION LIMITATIONS: meal prep, cleaning, laundry, driving, shopping, and  community activity  PERSONAL FACTORS: Past/current experiences are also affecting patient's functional outcome.   REHAB POTENTIAL: Good  CLINICAL DECISION MAKING: Evolving/moderate complexity  EVALUATION COMPLEXITY: High   GOALS: Goals reviewed with patient? No  SHORT TERM GOALS: Target date: 10/17/23 I with initial HEP Baseline: Goal status: INITIAL  2.  Increase R shoulder AROM by at least 10 degrees in flex, abd, ER Baseline:  Goal status: INITIAL  3.  Increase PROM of R shoulder to 90 + degrees in flexion and abd, 45 degrees in ER Baseline:  Goal status: INITIAL  LONG TERM GOALS: Target date: 12/05/23  I with final HEP Baseline:  Goal status: INITIAL  2.  Increase R shoulder AROM to at least 120 degrees for flex and abd, 80 degrees ER Baseline:  Goal status: INITIAL  3.  Increase  R shoulder strength to at least 4/5 in all planes of movement Baseline:  Goal status: INITIAL  4.  Improve scapular stabilized position on trunk to equal L scapula Baseline: R scap sits in depression, abd, downward rotation Goal status: INITIAL  5.  Patient will report the ability to use his RUE functionally 80% of the time as needed Baseline:  Goal status: INITIAL  6.  Improve Quick Dash score by at least 9% to indicate clinically relevant improvement. Baseline:  Goal status: INITIAL   PLAN:  PT FREQUENCY: 2x/week  PT DURATION: 10 weeks  PLANNED INTERVENTIONS: 97110-Therapeutic exercises, 97530- Therapeutic activity, 97112- Neuromuscular re-education, 97535- Self Care, 86578- Manual therapy, [id number]- Gait training, [id number]- Electrical stimulation (unattended), [phone]- Ionotophoresis 4mg /ml Dexamethasone, Patient/Family education, Balance training, Dry Needling, Joint mobilization, Cryotherapy, and Moist heat  PLAN FOR NEXT SESSION: A and PROM of R shoulder, joint mobs, STM, initiate strengthening and stability re-education. Neldon Mc, HEP   Iona Beard, DPT 09/26/2023, 9:55  AM

## 2023-09-26 ENCOUNTER — Encounter: Payer: Self-pay | Admitting: Physical Therapy

## 2023-09-26 ENCOUNTER — Ambulatory Visit: Payer: Medicaid Other | Attending: Internal Medicine | Admitting: Physical Therapy

## 2023-09-26 DIAGNOSIS — R293 Abnormal posture: Secondary | ICD-10-CM

## 2023-09-26 DIAGNOSIS — M25611 Stiffness of right shoulder, not elsewhere classified: Secondary | ICD-10-CM

## 2023-09-26 DIAGNOSIS — M869 Osteomyelitis, unspecified: Secondary | ICD-10-CM | POA: Diagnosis not present

## 2023-09-26 DIAGNOSIS — M25511 Pain in right shoulder: Secondary | ICD-10-CM

## 2023-09-26 DIAGNOSIS — M6281 Muscle weakness (generalized): Secondary | ICD-10-CM

## 2023-09-26 DIAGNOSIS — R262 Difficulty in walking, not elsewhere classified: Secondary | ICD-10-CM

## 2023-09-26 DIAGNOSIS — R2681 Unsteadiness on feet: Secondary | ICD-10-CM

## 2023-09-28 DIAGNOSIS — T8744 Infection of amputation stump, left lower extremity: Secondary | ICD-10-CM | POA: Diagnosis not present

## 2023-10-01 DIAGNOSIS — B9562 Methicillin resistant Staphylococcus aureus infection as the cause of diseases classified elsewhere: Secondary | ICD-10-CM | POA: Diagnosis not present

## 2023-10-01 DIAGNOSIS — M8600 Acute hematogenous osteomyelitis, unspecified site: Secondary | ICD-10-CM | POA: Diagnosis not present

## 2023-10-02 ENCOUNTER — Encounter: Payer: Self-pay | Admitting: Physical Therapy

## 2023-10-02 ENCOUNTER — Ambulatory Visit: Payer: Medicaid Other | Admitting: Physical Therapy

## 2023-10-02 DIAGNOSIS — R262 Difficulty in walking, not elsewhere classified: Secondary | ICD-10-CM

## 2023-10-02 DIAGNOSIS — M6281 Muscle weakness (generalized): Secondary | ICD-10-CM

## 2023-10-02 DIAGNOSIS — R2681 Unsteadiness on feet: Secondary | ICD-10-CM

## 2023-10-02 DIAGNOSIS — M25611 Stiffness of right shoulder, not elsewhere classified: Secondary | ICD-10-CM

## 2023-10-02 DIAGNOSIS — R293 Abnormal posture: Secondary | ICD-10-CM

## 2023-10-02 DIAGNOSIS — M25511 Pain in right shoulder: Secondary | ICD-10-CM

## 2023-10-02 NOTE — Therapy (Signed)
OUTPATIENT PHYSICAL THERAPY UPPER EXTREMITY EVALUATION   Patient Name: Richard Mendoza MRN: [id number] DOB:October 19, 1976, 47 y.o., male Today's Date: 10/02/2023  END OF SESSION:  PT End of Session - 10/02/23 1014     Visit Number 2    Date for PT Re-Evaluation 12/05/23    PT Start Time 1015    PT Stop Time 1100    PT Time Calculation (min) 45 min    Activity Tolerance Patient tolerated treatment well    Behavior During Therapy [facility] for tasks assessed/performed             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   Past Surgical History:  Procedure Laterality Date   AMPUTATION Left 11/07/2020   Procedure: 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;  Surgeon: Linus Galas, DPM;  Location: [facility];  Service: Podiatry;  Laterality: Left;   AMPUTATION Left 11/16/2020   Procedure: AMPUTATION BELOW KNEE;  Surgeon: Annice Needy, MD;  Location: [facility];  Service: Vascular;  Laterality: Left;   ANTERIOR CERVICAL DECOMP/DISCECTOMY FUSION N/A 09/09/2017   Procedure: ANTERIOR CERVICAL DECOMPRESSION/DISCECTOMY FUSION CERVICAL 6- CERVICAL 7;  Surgeon: Coletta Memos, MD;  Location: [facility];  Service: Neurosurgery;  Laterality: N/A;  ANTERIOR CERVICAL DECOMPRESSION/DISCECTOMY FUSION CERVICAL 6- CERVICAL 7   APPENDECTOMY     I & D EXTREMITY Right 10/03/2017   Procedure: IRRIGATION AND DEBRIDEMENT RIGHT WRIST;  Surgeon: Betha Loa, MD;  Location: [facility];  Service: Orthopedics;  Laterality: Right;   I & D EXTREMITY Right 11/26/2018   Procedure: IRRIGATION AND DEBRIDEMENT FASCIA ON RIGHT FOOT;  Surgeon: Gwyneth Revels, DPM;  Location: [facility];  Service: Podiatry;  Laterality: Right;   INCISION AND DRAINAGE Right 03/06/2019   Procedure: INCISION AND DRAINAGE RIGHT FOOT, WITH 4th RAY AMPUTATION;  Surgeon: Gwyneth Revels, DPM;  Location: [facility];   Service: Podiatry;  Laterality: Right;   INCISION AND DRAINAGE Left 11/07/2020   Procedure: INCISION AND DRAINAGE;  Surgeon: Linus Galas, DPM;  Location: [facility];  Service: Podiatry;  Laterality: Left;   INCISION AND DRAINAGE ABSCESS Left 05/02/2023   Procedure: INCISION AND DRAINAGE ABSCESS LEFT LOWER EXTREMITY;  Surgeon: Annice Needy, MD;  Location: [facility];  Service: Vascular;  Laterality: Left;   IRRIGATION AND DEBRIDEMENT FOOT Left 11/11/2020   Procedure: IRRIGATION AND DEBRIDEMENT FOOT;  Surgeon: Linus Galas, DPM;  Location: [facility];  Service: Podiatry;  Laterality: Left;   METATARSAL HEAD EXCISION Right 05/15/2019   Procedure: OSTECTOMY;MET HEAD 5;  Surgeon: Gwyneth Revels, DPM;  Location: [facility];  Service: Podiatry;  Laterality: Right;   osteomylitis     ROTATOR CUFF REPAIR Left    SHOULDER ARTHROSCOPY WITH DEBRIDEMENT AND BICEP TENDON REPAIR Right 08/27/2023   Procedure: SHOULDER ARTHROSCOPY WITH INCISION AND DRAINAGE;  Surgeon: Signa Kell, MD;  Location: [facility];  Service: Orthopedics;  Laterality: Right;   Patient Active Problem List   Diagnosis Date Noted   Staphylococcal arthritis of right shoulder (HCC) 08/29/2023   Bacteremia due to Staphylococcus aureus 08/29/2023   Acute bursitis of right shoulder 08/24/2023   Iron deficiency anemia, unspecified 08/23/2023   Overweight (BMI 25.0-29.9) 08/22/2023   MRSA bacteremia 08/22/2023   Amputation stump infection (HCC) 08/20/2023   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) 06/01/2023   Chronic kidney disease, stage 3a (HCC) 06/01/2023   Tobacco  abuse 06/01/2023   Cellulitis and abscess of left leg 04/30/2023   Cellulitis of left lower extremity 04/29/2023   Tobacco use 04/29/2023   Carpal tunnel syndrome 07/12/2022   Hyperglycemia due to type 2 diabetes mellitus (HCC) 07/12/2022   Polyneuropathy 07/12/2022   Ulnar neuropathy 07/12/2022   Acute epigastric pain 07/12/2022   Gastro-esophageal reflux disease  without esophagitis 07/12/2022   Nausea and vomiting 07/12/2022   Abscess of right upper extremity 02/26/2022   Cellulitis of left lower extremity without foot    Sepsis (HCC) 09/08/2021   Hypokalemia 09/06/2021   Hyponatremia 09/06/2021   PAD (peripheral artery disease) (HCC) 05/26/2021   Acute kidney injury superimposed on chronic kidney disease (HCC) 04/03/2021   Syncope 04/03/2021   Neck pain    Abnormal weight loss 12/09/2020   Change in bowel habit 12/09/2020   Constipation 12/09/2020   Epigastric pain 12/09/2020   Drug-induced constipation 12/09/2020   Periumbilical pain 12/09/2020   Fatty liver 12/09/2020   S/P BKA (below knee amputation), left (HCC) 11/20/2020   Necrotizing fasciitis of ankle and foot (HCC) 11/07/2020   History of 2019 novel coronavirus disease (COVID-19) 09/01/2020   Major depressive disorder, recurrent, in remission (HCC) 09/01/2020   Pain management contract signed 09/07/2019   Evaluation by psychiatric service required 08/18/2019   History of depression 08/18/2019   Primary osteoarthritis of both shoulders 07/16/2019   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) 07/16/2019   Chronic pain of both shoulders 07/16/2019   Cervical radicular pain 07/16/2019   S/P cervical spinal fusion 07/16/2019   Cervical spondylosis 07/16/2019   Cervical facet joint syndrome 07/16/2019   Chronic pain syndrome 07/16/2019   Anemia of chronic disease 05/25/2019   Osteomyelitis (HCC) 05/25/2019   Diabetic foot infection (HCC) 03/05/2019   Type 2 diabetes mellitus without complication (HCC) 01/01/2019   Abscess 11/25/2018   Diabetic peripheral neuropathy associated with type 2 diabetes mellitus (HCC) 06/16/2018   Hyperlipidemia associated with type 2 diabetes mellitus (HCC) 06/16/2018   Intractable nausea and vomiting 10/21/2017   HNP (herniated nucleus pulposus), cervical 09/09/2017   Displacement of cervical intervertebral disc without myelopathy 08/27/2017    Erectile dysfunction 07/31/2017   Gastroesophageal reflux disease 07/31/2017   Rotator cuff syndrome of left shoulder 07/31/2017   Cellulitis of right leg 07/30/2016   Tobacco abuse disorder 07/30/2016   Routine general medical examination at a health care facility 01/20/2014   Diabetic neuropathy, type I diabetes mellitus (HCC) 01/20/2014   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   HLD (hyperlipidemia) 03/17/2009   DM type 2 with diabetic peripheral neuropathy (HCC) 03/02/2009   Uncontrolled diabetes mellitus with hyperglycemia (HCC) 12/08/2008   OBSTRUCTIVE SLEEP APNEA 12/08/2008   Essential hypertension 12/08/2008   Allergic rhinitis 12/15/2007   Other malaise and fatigue 12/15/2007    PCP: Barbette Reichmann, MD   REFERRING PROVIDER: Novella Olive, MD   REFERRING DIAG: osteomyelitis  THERAPY DIAG:  Abnormal posture  Difficulty in walking, not elsewhere classified  Muscle weakness (generalized)  Unsteadiness on feet  Acute pain of right shoulder  Stiffness of right shoulder, not elsewhere classified  Rationale for Evaluation and Treatment: Rehabilitation  ONSET DATE: 08/29/23  SUBJECTIVE:   SUBJECTIVE STATEMENT: HEP is slightly helping, shoulder is stiff no pain. Sore with activity  PERTINENT HISTORY: Per referring physician note: Hospital Course: 47yo with medical history significant of uncontrolled type 2 diabetes, tobacco abuse, hypertension, hyperlipidemia, and history of BKA who presented on 10/1 with diabetic stump infection.  Patient is seen  by vascular surgery, started on broad-spectrum antibiotics with cefepime, Flagyl and vancomycin.  MRI of the left lower extremity showed soft tissue abscess, tibial osteomyelitis, knee joint effusion. Blood culture 1 set positive for Staph aureus, epidermidis and strep agalactiae.  Patient seen by ID, treated with vancomycin then changed to daptomycin.  Patient also found to have significant bursitis in the right shoulder MRI,  joint aspiration consistent with septic joint.  Joint washout on 10/8. Acute on chronic kidney disease, uncontrolled DM2, reflux, tobacco abuse, anemia-chronic, HTN, hyperlipidemia Per Orthopedic surgeon note F/U 09/10/23: Impression: Status post arthroscopy of right shoulder [Z98.890] Status post right shoulder irrigation and debridement for septic shoulder (primary encounter diagnosis)  Plan:  1. Continue with physical therapy. Discontinue sling. Progress with range of motion exercises. Continue with IV antibiotics and follow-up with ID. Follow-up with Dr. Allena Katz in 4 weeks for 6-week postop check. This note was generated in part with voice recognition software and I apologize for any typographical errors that were not detected and corrected.    PAIN:  Are you having pain? Patient denies pain, but reports some soreness after using his R arm  PRECAUTIONS: Fall  RED FLAGS: None   WEIGHT BEARING RESTRICTIONS: No  FALLS:  Has patient fallen in last 6 months? No  LIVING ENVIRONMENT: Lives with: lives with their family Lives in: House/apartment Stairs: No ramped entrance Has following equipment at home: Dan Humphreys - 2 wheeled and Wheelchair (manual)  OCCUPATION: Not at this time.  PLOF: Independent  PATIENT GOALS: Patient would like to recover the strength and ROM in his R shoulder to be able to use it more functionally. Right now it is too weak to use normally  NEXT MD VISIT: Allena Katz- around 3-5 weeks  OBJECTIVE:  Note: Objective measures were completed at Evaluation unless otherwise noted.  DIAGNOSTIC FINDINGS:  MRI L knee 08/20/23 IMPRESSION: 1. New small soft tissue abscess extending inferiorly from the tibial stump. 2. Significant interval increase in T2 hyperintensity and enhancement throughout the remaining tibia, highly suspicious for osteomyelitis. 3. Enlarging knee joint effusion with mild synovial enhancement, nonspecific. Cannot exclude early septic arthritis. No  evidence of osteomyelitis within the distal femur, patella or proximal fibula.  R shoulder MRI 08/22/23 IMPRESSION: 1. Prominent distal anterior supraspinatus tendinopathy with partial thickness articular surface tearing extending towards but not definitely reaching the bursal surface. 2. Moderate to prominent subscapularis and mild infraspinatus tendinopathy. 3. Moderate tendinopathy of the intra-articular segment of the long head of the biceps. 4. Moderate degenerative arthropathy of the [facility] joint. 5. Extensive fluid in the subacromial subdeltoid bursa compatible with bursitis. 6. Substantial joint effusion with fluid and potentially synovitis in the rotator interval. 7. Abnormal intramuscular edema anteriorly and laterally in the deltoid muscle, and along the periphery of the posterior deltoid. Appearance favors muscle strain/tear.  COGNITION: Overall cognitive status: Within functional limits for tasks assessed     SENSATION: Patient reports N & T in R forearm and 4th and 5th digits.  EDEMA:  Patient denies swelling  POSTURE: rounded shoulders and R scapula held in depression and abd and slight downward rotation  PALPATION: TTP medial scap border, supraspinatus tendon, deltoid  UPPER EXTREMITY ROM: L WNL, R shoulder AROM-flex 43, abd 67, ER 15, IR WNL, B elbows lack full ext   UPPER EXTREMITY MMT: LUE WNL, R shoulder- 2+/5, elbow, wrist, hand 4/5, Shoulder shrug 5/5 B, able to activate scapular stabilizers.   UPPER EXTREMITY SPECIAL TESTS:  Full can, empty can drop arm all (+),  belly press, lift off (+), additional testing deferred due to inability to assume test position  JOINT MOBILITY TESTING: Scapular mobility WNL, GH joint severely restricted in all planes  FUNCTIONAL TESTS:  Quick Dash- TBD  GAIT: Distance walked: In clinic distances Assistive device utilized: None Level of assistance: Modified independence Comments: Wearing his old prosthesis which is  partially broken. Told to limit time on the limb. He is getting a new prosthesis in about a week.   TODAY'S TREATMENT:                                                                                                                              DATE:  10/02/23 UBE L1 x 3 min each AAROM Flex, Ext, IR up back 1lb WaTE x10, LUE  abd dowel x10 Rows green 2x10 Shoulder Ext green x10 RUE ER/IR yellow 2x10  RUE PROM with end range holds  R HG mobs 2-3  09/26/23 Education  PATIENT EDUCATION:  Education details: POC Person educated: Patient Education method: Explanation Education comprehension: verbalized understanding  HOME EXERCISE PROGRAM: Verbally instructed in B scap retraction, seated press ups, wall slides. [id number]  ASSESSMENT:  CLINICAL IMPRESSION: Patient is a 47 y.o. who was seen today for physical therapy treatment for L Residual limb osteomyelitis, R shoulder bursitis, multiple tendinopathies, and sepsis. Pt enters doing well having a positive response to HEP. He stated his LLE was good. Initiated RUE strength, scapular stability, and ROM. AAROM went well, R shoulder tightness reported with Rows and extensions. Initial pain with passive shoulder flex that eased up as reps progressed.  OBJECTIVE IMPAIRMENTS: decreased activity tolerance, decreased balance, decreased mobility, difficulty walking, decreased ROM, decreased strength, impaired flexibility, impaired UE functional use, postural dysfunction, and pain.   ACTIVITY LIMITATIONS: carrying, lifting, bending, standing, stairs, transfers, and locomotion level  PARTICIPATION LIMITATIONS: meal prep, cleaning, laundry, driving, shopping, and community activity  PERSONAL FACTORS: Past/current experiences are also affecting patient's functional outcome.   REHAB POTENTIAL: Good  CLINICAL DECISION MAKING: Evolving/moderate complexity  EVALUATION COMPLEXITY: High   GOALS: Goals reviewed with patient? No  SHORT TERM GOALS:  Target date: 10/17/23 I with initial HEP Baseline: Goal status: INITIAL  2.  Increase R shoulder AROM by at least 10 degrees in flex, abd, ER Baseline:  Goal status: INITIAL  3.  Increase PROM of R shoulder to 90 + degrees in flexion and abd, 45 degrees in ER Baseline:  Goal status: INITIAL  LONG TERM GOALS: Target date: 12/05/23  I with final HEP Baseline:  Goal status: INITIAL  2.  Increase R shoulder AROM to at least 120 degrees for flex and abd, 80 degrees ER Baseline:  Goal status: INITIAL  3.  Increase R shoulder strength to at least 4/5 in all planes of movement Baseline:  Goal status: INITIAL  4.  Improve scapular stabilized position on trunk to equal L scapula Baseline: R scap sits in depression, abd, downward rotation Goal status: INITIAL  5.  Patient will report the ability to use his RUE functionally 80% of the time as needed Baseline:  Goal status: INITIAL  6.  Improve Quick Dash score by at least 9% to indicate clinically relevant improvement. Baseline:  Goal status: INITIAL   PLAN:  PT FREQUENCY: 2x/week  PT DURATION: 10 weeks  PLANNED INTERVENTIONS: 97110-Therapeutic exercises, 97530- Therapeutic activity, [id number]- Neuromuscular re-education, 97535- Self Care, 32440- Manual therapy, [id number]- Gait training, 97014- Electrical stimulation (unattended), [phone]- Ionotophoresis 4mg /ml Dexamethasone, Patient/Family education, Balance training, Dry Needling, Joint mobilization, Cryotherapy, and Moist heat  PLAN FOR NEXT SESSION: A and PROM of R shoulder, joint mobs, STM, initiate strengthening and stability re-education. Neldon Mc, HEP   Iona Beard, DPT 10/02/2023, 10:14 AM

## 2023-10-03 DIAGNOSIS — R269 Unspecified abnormalities of gait and mobility: Secondary | ICD-10-CM | POA: Diagnosis not present

## 2023-10-08 ENCOUNTER — Encounter: Payer: Self-pay | Admitting: Physical Therapy

## 2023-10-08 ENCOUNTER — Ambulatory Visit: Payer: Medicaid Other | Admitting: Physical Therapy

## 2023-10-08 DIAGNOSIS — M25511 Pain in right shoulder: Secondary | ICD-10-CM

## 2023-10-08 DIAGNOSIS — R262 Difficulty in walking, not elsewhere classified: Secondary | ICD-10-CM

## 2023-10-08 DIAGNOSIS — M25611 Stiffness of right shoulder, not elsewhere classified: Secondary | ICD-10-CM

## 2023-10-08 DIAGNOSIS — R293 Abnormal posture: Secondary | ICD-10-CM | POA: Diagnosis not present

## 2023-10-08 DIAGNOSIS — M6281 Muscle weakness (generalized): Secondary | ICD-10-CM

## 2023-10-08 NOTE — Therapy (Signed)
OUTPATIENT PHYSICAL THERAPY UPPER EXTREMITY EVALUATION   Patient Name: Richard Mendoza MRN: [id number] DOB:[birth year], 47 y.o., male Today's Date: 10/08/2023  END OF SESSION:  PT End of Session - 10/08/23 0757     Visit Number 3    Date for PT Re-Evaluation 12/05/23    PT Start Time 0800    PT Stop Time 0845    PT Time Calculation (min) 45 min    Activity Tolerance Patient tolerated treatment well    Behavior During Therapy [facility] for tasks assessed/performed             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   Past Surgical History:  Procedure Laterality Date   AMPUTATION Left 11/07/2020   Procedure: 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;  Surgeon: Linus Galas, DPM;  Location: [facility];  Service: Podiatry;  Laterality: Left;   AMPUTATION Left 11/16/2020   Procedure: AMPUTATION BELOW KNEE;  Surgeon: Annice Needy, MD;  Location: [facility];  Service: Vascular;  Laterality: Left;   ANTERIOR CERVICAL DECOMP/DISCECTOMY FUSION N/A 09/09/2017   Procedure: ANTERIOR CERVICAL DECOMPRESSION/DISCECTOMY FUSION CERVICAL 6- CERVICAL 7;  Surgeon: Coletta Memos, MD;  Location: [facility];  Service: Neurosurgery;  Laterality: N/A;  ANTERIOR CERVICAL DECOMPRESSION/DISCECTOMY FUSION CERVICAL 6- CERVICAL 7   APPENDECTOMY     I & D EXTREMITY Right 10/03/2017   Procedure: IRRIGATION AND DEBRIDEMENT RIGHT WRIST;  Surgeon: Betha Loa, MD;  Location: [facility];  Service: Orthopedics;  Laterality: Right;   I & D EXTREMITY Right 11/26/2018   Procedure: IRRIGATION AND DEBRIDEMENT FASCIA ON RIGHT FOOT;  Surgeon: Gwyneth Revels, DPM;  Location: [facility];  Service: Podiatry;  Laterality: Right;   INCISION AND DRAINAGE Right 03/06/2019   Procedure: INCISION AND DRAINAGE RIGHT FOOT, WITH 4th RAY AMPUTATION;  Surgeon: Gwyneth Revels, DPM;  Location: [facility];   Service: Podiatry;  Laterality: Right;   INCISION AND DRAINAGE Left 11/07/2020   Procedure: INCISION AND DRAINAGE;  Surgeon: Linus Galas, DPM;  Location: [facility];  Service: Podiatry;  Laterality: Left;   INCISION AND DRAINAGE ABSCESS Left 05/02/2023   Procedure: INCISION AND DRAINAGE ABSCESS LEFT LOWER EXTREMITY;  Surgeon: Annice Needy, MD;  Location: [facility];  Service: Vascular;  Laterality: Left;   IRRIGATION AND DEBRIDEMENT FOOT Left 11/11/2020   Procedure: IRRIGATION AND DEBRIDEMENT FOOT;  Surgeon: Linus Galas, DPM;  Location: [facility];  Service: Podiatry;  Laterality: Left;   METATARSAL HEAD EXCISION Right 05/15/2019   Procedure: OSTECTOMY;MET HEAD 5;  Surgeon: Gwyneth Revels, DPM;  Location: [facility];  Service: Podiatry;  Laterality: Right;   osteomylitis     ROTATOR CUFF REPAIR Left    SHOULDER ARTHROSCOPY WITH DEBRIDEMENT AND BICEP TENDON REPAIR Right 08/27/2023   Procedure: SHOULDER ARTHROSCOPY WITH INCISION AND DRAINAGE;  Surgeon: Signa Kell, MD;  Location: [facility];  Service: Orthopedics;  Laterality: Right;   Patient Active Problem List   Diagnosis Date Noted   Staphylococcal arthritis of right shoulder (HCC) 08/29/2023   Bacteremia due to Staphylococcus aureus 08/29/2023   Acute bursitis of right shoulder 08/24/2023   Iron deficiency anemia, unspecified 08/23/2023   Overweight (BMI 25.0-29.9) 08/22/2023   MRSA bacteremia 08/22/2023   Amputation stump infection (HCC) 08/20/2023   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) 06/01/2023   Chronic kidney disease, stage 3a (HCC) 06/01/2023   Tobacco  abuse 06/01/2023   Cellulitis and abscess of left leg 04/30/2023   Cellulitis of left lower extremity 04/29/2023   Tobacco use 04/29/2023   Carpal tunnel syndrome 07/12/2022   Hyperglycemia due to type 2 diabetes mellitus (HCC) 07/12/2022   Polyneuropathy 07/12/2022   Ulnar neuropathy 07/12/2022   Acute epigastric pain 07/12/2022   Gastro-esophageal reflux disease  without esophagitis 07/12/2022   Nausea and vomiting 07/12/2022   Abscess of right upper extremity 02/26/2022   Cellulitis of left lower extremity without foot    Sepsis (HCC) 09/08/2021   Hypokalemia 09/06/2021   Hyponatremia 09/06/2021   PAD (peripheral artery disease) (HCC) 05/26/2021   Acute kidney injury superimposed on chronic kidney disease (HCC) 04/03/2021   Syncope 04/03/2021   Neck pain    Abnormal weight loss 12/09/2020   Change in bowel habit 12/09/2020   Constipation 12/09/2020   Epigastric pain 12/09/2020   Drug-induced constipation 12/09/2020   Periumbilical pain 12/09/2020   Fatty liver 12/09/2020   S/P BKA (below knee amputation), left (HCC) 11/20/2020   Necrotizing fasciitis of ankle and foot (HCC) 11/07/2020   History of 2019 novel coronavirus disease (COVID-19) 09/01/2020   Major depressive disorder, recurrent, in remission (HCC) 09/01/2020   Pain management contract signed 09/07/2019   Evaluation by psychiatric service required 08/18/2019   History of depression 08/18/2019   Primary osteoarthritis of both shoulders 07/16/2019   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) 07/16/2019   Chronic pain of both shoulders 07/16/2019   Cervical radicular pain 07/16/2019   S/P cervical spinal fusion 07/16/2019   Cervical spondylosis 07/16/2019   Cervical facet joint syndrome 07/16/2019   Chronic pain syndrome 07/16/2019   Anemia of chronic disease 05/25/2019   Osteomyelitis (HCC) 05/25/2019   Diabetic foot infection (HCC) 03/05/2019   Type 2 diabetes mellitus without complication (HCC) 01/01/2019   Abscess 11/25/2018   Diabetic peripheral neuropathy associated with type 2 diabetes mellitus (HCC) 06/16/2018   Hyperlipidemia associated with type 2 diabetes mellitus (HCC) 06/16/2018   Intractable nausea and vomiting 10/21/2017   HNP (herniated nucleus pulposus), cervical 09/09/2017   Displacement of cervical intervertebral disc without myelopathy 08/27/2017    Erectile dysfunction 07/31/2017   Gastroesophageal reflux disease 07/31/2017   Rotator cuff syndrome of left shoulder 07/31/2017   Cellulitis of right leg 07/30/2016   Tobacco abuse disorder 07/30/2016   Routine general medical examination at a health care facility 01/20/2014   Diabetic neuropathy, type I diabetes mellitus (HCC) 01/20/2014   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   HLD (hyperlipidemia) 03/17/2009   DM type 2 with diabetic peripheral neuropathy (HCC) 03/02/2009   Uncontrolled diabetes mellitus with hyperglycemia (HCC) 12/08/2008   OBSTRUCTIVE SLEEP APNEA 12/08/2008   Essential hypertension 12/08/2008   Allergic rhinitis 12/15/2007   Other malaise and fatigue 12/15/2007    PCP: Barbette Reichmann, MD   REFERRING PROVIDER: Novella Olive, MD   REFERRING DIAG: osteomyelitis  THERAPY DIAG:  Abnormal posture  Difficulty in walking, not elsewhere classified  Muscle weakness (generalized)  Stiffness of right shoulder, not elsewhere classified  Acute pain of right shoulder  Rationale for Evaluation and Treatment: Rehabilitation  ONSET DATE: 08/29/23  SUBJECTIVE:   SUBJECTIVE STATEMENT: Shoulder is sore form helping with a bathroom remodel   PERTINENT HISTORY: Per referring physician note: Hospital Course: 47yo with medical history significant of uncontrolled type 2 diabetes, tobacco abuse, hypertension, hyperlipidemia, and history of BKA who presented on 10/1 with diabetic stump infection.  Patient is seen by vascular surgery, started on broad-spectrum  antibiotics with cefepime, Flagyl and vancomycin.  MRI of the left lower extremity showed soft tissue abscess, tibial osteomyelitis, knee joint effusion. Blood culture 1 set positive for Staph aureus, epidermidis and strep agalactiae.  Patient seen by ID, treated with vancomycin then changed to daptomycin.  Patient also found to have significant bursitis in the right shoulder MRI, joint aspiration consistent with septic  joint.  Joint washout on 10/8. Acute on chronic kidney disease, uncontrolled DM2, reflux, tobacco abuse, anemia-chronic, HTN, hyperlipidemia Per Orthopedic surgeon note F/U 09/10/23: Impression: Status post arthroscopy of right shoulder [Z98.890] Status post right shoulder irrigation and debridement for septic shoulder (primary encounter diagnosis)  Plan:  1. Continue with physical therapy. Discontinue sling. Progress with range of motion exercises. Continue with IV antibiotics and follow-up with ID. Follow-up with Dr. Allena Katz in 4 weeks for 6-week postop check. This note was generated in part with voice recognition software and I apologize for any typographical errors that were not detected and corrected.    PAIN:  Are you having pain? Patient denies pain, but soreness after using his R arm  PRECAUTIONS: Fall  RED FLAGS: None   WEIGHT BEARING RESTRICTIONS: No  FALLS:  Has patient fallen in last 6 months? No  LIVING ENVIRONMENT: Lives with: lives with their family Lives in: House/apartment Stairs: No ramped entrance Has following equipment at home: Dan Humphreys - 2 wheeled and Wheelchair (manual)  OCCUPATION: Not at this time.  PLOF: Independent  PATIENT GOALS: Patient would like to recover the strength and ROM in his R shoulder to be able to use it more functionally. Right now it is too weak to use normally  NEXT MD VISIT: Allena Katz- around 3-5 weeks  OBJECTIVE:  Note: Objective measures were completed at Evaluation unless otherwise noted.  DIAGNOSTIC FINDINGS:  MRI L knee 08/20/23 IMPRESSION: 1. New small soft tissue abscess extending inferiorly from the tibial stump. 2. Significant interval increase in T2 hyperintensity and enhancement throughout the remaining tibia, highly suspicious for osteomyelitis. 3. Enlarging knee joint effusion with mild synovial enhancement, nonspecific. Cannot exclude early septic arthritis. No evidence of osteomyelitis within the distal femur,  patella or proximal fibula.  R shoulder MRI 08/22/23 IMPRESSION: 1. Prominent distal anterior supraspinatus tendinopathy with partial thickness articular surface tearing extending towards but not definitely reaching the bursal surface. 2. Moderate to prominent subscapularis and mild infraspinatus tendinopathy. 3. Moderate tendinopathy of the intra-articular segment of the long head of the biceps. 4. Moderate degenerative arthropathy of the [facility] joint. 5. Extensive fluid in the subacromial subdeltoid bursa compatible with bursitis. 6. Substantial joint effusion with fluid and potentially synovitis in the rotator interval. 7. Abnormal intramuscular edema anteriorly and laterally in the deltoid muscle, and along the periphery of the posterior deltoid. Appearance favors muscle strain/tear.  COGNITION: Overall cognitive status: Within functional limits for tasks assessed     SENSATION: Patient reports N & T in R forearm and 4th and 5th digits.  EDEMA:  Patient denies swelling  POSTURE: rounded shoulders and R scapula held in depression and abd and slight downward rotation  PALPATION: TTP medial scap border, supraspinatus tendon, deltoid  UPPER EXTREMITY ROM: L WNL, R shoulder AROM-flex 43, abd 67, ER 15, IR WNL, B elbows lack full ext   UPPER EXTREMITY MMT: LUE WNL, R shoulder- 2+/5, elbow, wrist, hand 4/5, Shoulder shrug 5/5 B, able to activate scapular stabilizers.   UPPER EXTREMITY SPECIAL TESTS:  Full can, empty can drop arm all (+), belly press, lift off (+), additional testing  deferred due to inability to assume test position  JOINT MOBILITY TESTING: Scapular mobility WNL, GH joint severely restricted in all planes  FUNCTIONAL TESTS:  Quick Dash- TBD  GAIT: Distance walked: In clinic distances Assistive device utilized: None Level of assistance: Modified independence Comments: Wearing his old prosthesis which is partially broken. Told to limit time on the limb. He is  getting a new prosthesis in about a week.   TODAY'S TREATMENT:                                                                                                                              DATE:  10/08/23 UBE L2 x 3 min each Seated Rows & Lats 25lb 2x10 RUE ER/IR yellow 2x10 AAROM Flex, Ext, IR up back 2lb WaTE x10, LUE  abd dowel x10 Shoulder abd 2x10 RUE PROM with end range holds  R GH mobs 2-3  10/02/23 UBE L1 x 3 min each AAROM Flex, Ext, IR up back 1lb WaTE x10, LUE  abd dowel x10 Rows green 2x10 Shoulder Ext green x10 RUE ER/IR yellow 2x10  RUE PROM with end range holds  R HG mobs 2-3  09/26/23 Education  PATIENT EDUCATION:  Education details: POC Person educated: Patient Education method: Explanation Education comprehension: verbalized understanding  HOME EXERCISE PROGRAM: Verbally instructed in B scap retraction, seated press ups, wall slides. [id number]  ASSESSMENT:  CLINICAL IMPRESSION: Patient is a 47 y.o. who was seen today for physical therapy treatment for L Residual limb osteomyelitis, R shoulder bursitis, multiple tendinopathies, and sepsis. Pt enters doing well overall but reporting some sourness. Continued with RUE strength, scapular stability, and ROM. AAROM went well. R shoulder ER and abduction is very weak.  Initial pain with passive shoulder flex that eased up as reps progressed. Pt able to get to full PROM.  OBJECTIVE IMPAIRMENTS: decreased activity tolerance, decreased balance, decreased mobility, difficulty walking, decreased ROM, decreased strength, impaired flexibility, impaired UE functional use, postural dysfunction, and pain.   ACTIVITY LIMITATIONS: carrying, lifting, bending, standing, stairs, transfers, and locomotion level  PARTICIPATION LIMITATIONS: meal prep, cleaning, laundry, driving, shopping, and community activity  PERSONAL FACTORS: Past/current experiences are also affecting patient's functional outcome.   REHAB POTENTIAL:  Good  CLINICAL DECISION MAKING: Evolving/moderate complexity  EVALUATION COMPLEXITY: High   GOALS: Goals reviewed with patient? No  SHORT TERM GOALS: Target date: 10/17/23 I with initial HEP Baseline: Goal status: Met 10/08/23  2.  Increase R shoulder AROM by at least 10 degrees in flex, abd, ER Baseline:  Goal status: INITIAL  3.  Increase PROM of R shoulder to 90 + degrees in flexion and abd, 45 degrees in ER Baseline:  Goal status: INITIAL  LONG TERM GOALS: Target date: 12/05/23  I with final HEP Baseline:  Goal status: INITIAL  2.  Increase R shoulder AROM to at least 120 degrees for flex and abd, 80 degrees ER Baseline:  Goal status: INITIAL  3.  Increase  R shoulder strength to at least 4/5 in all planes of movement Baseline:  Goal status: INITIAL  4.  Improve scapular stabilized position on trunk to equal L scapula Baseline: R scap sits in depression, abd, downward rotation Goal status: INITIAL  5.  Patient will report the ability to use his RUE functionally 80% of the time as needed Baseline:  Goal status: INITIAL  6.  Improve Quick Dash score by at least 9% to indicate clinically relevant improvement. Baseline:  Goal status: INITIAL   PLAN:  PT FREQUENCY: 2x/week  PT DURATION: 10 weeks  PLANNED INTERVENTIONS: 97110-Therapeutic exercises, 97530- Therapeutic activity, [id number]- Neuromuscular re-education, 97535- Self Care, 16109- Manual therapy, [id number]- Gait training, 97014- Electrical stimulation (unattended), [phone]- Ionotophoresis 4mg /ml Dexamethasone, Patient/Family education, Balance training, Dry Needling, Joint mobilization, Cryotherapy, and Moist heat  PLAN FOR NEXT SESSION: A and PROM of R shoulder, joint mobs, STM, initiate strengthening and stability re-education. Neldon Mc, HEP   Iona Beard, DPT 10/08/2023, 7:58 AM

## 2023-10-11 ENCOUNTER — Ambulatory Visit (INDEPENDENT_AMBULATORY_CARE_PROVIDER_SITE_OTHER): Payer: Medicaid Other | Admitting: Nurse Practitioner

## 2023-10-11 ENCOUNTER — Encounter: Payer: Self-pay | Admitting: Physical Therapy

## 2023-10-11 ENCOUNTER — Ambulatory Visit: Payer: Medicaid Other | Admitting: Physical Therapy

## 2023-10-11 DIAGNOSIS — R293 Abnormal posture: Secondary | ICD-10-CM | POA: Diagnosis not present

## 2023-10-11 DIAGNOSIS — M6281 Muscle weakness (generalized): Secondary | ICD-10-CM

## 2023-10-11 DIAGNOSIS — R262 Difficulty in walking, not elsewhere classified: Secondary | ICD-10-CM

## 2023-10-11 DIAGNOSIS — M25611 Stiffness of right shoulder, not elsewhere classified: Secondary | ICD-10-CM

## 2023-10-11 NOTE — Therapy (Signed)
OUTPATIENT PHYSICAL THERAPY UPPER EXTREMITY EVALUATION   Patient Name: Richard Mendoza MRN: [id number] DOB:1976-09-30, 47 y.o., male Today's Date: 10/11/2023  END OF SESSION:  PT End of Session - 10/11/23 0848     Visit Number 4    Date for PT Re-Evaluation 12/05/23    PT Start Time 0845    PT Stop Time 0930    PT Time Calculation (min) 45 min    Activity Tolerance Patient tolerated treatment well    Behavior During Therapy [facility] for tasks assessed/performed             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   Past Surgical History:  Procedure Laterality Date   AMPUTATION Left 11/07/2020   Procedure: 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;  Surgeon: Linus Galas, DPM;  Location: [facility];  Service: Podiatry;  Laterality: Left;   AMPUTATION Left 11/16/2020   Procedure: AMPUTATION BELOW KNEE;  Surgeon: Annice Needy, MD;  Location: [facility];  Service: Vascular;  Laterality: Left;   ANTERIOR CERVICAL DECOMP/DISCECTOMY FUSION N/A 09/09/2017   Procedure: ANTERIOR CERVICAL DECOMPRESSION/DISCECTOMY FUSION CERVICAL 6- CERVICAL 7;  Surgeon: Coletta Memos, MD;  Location: [facility];  Service: Neurosurgery;  Laterality: N/A;  ANTERIOR CERVICAL DECOMPRESSION/DISCECTOMY FUSION CERVICAL 6- CERVICAL 7   APPENDECTOMY     I & D EXTREMITY Right 10/03/2017   Procedure: IRRIGATION AND DEBRIDEMENT RIGHT WRIST;  Surgeon: Betha Loa, MD;  Location: [facility];  Service: Orthopedics;  Laterality: Right;   I & D EXTREMITY Right 11/26/2018   Procedure: IRRIGATION AND DEBRIDEMENT FASCIA ON RIGHT FOOT;  Surgeon: Gwyneth Revels, DPM;  Location: [facility];  Service: Podiatry;  Laterality: Right;   INCISION AND DRAINAGE Right 03/06/2019   Procedure: INCISION AND DRAINAGE RIGHT FOOT, WITH 4th RAY AMPUTATION;  Surgeon: Gwyneth Revels, DPM;  Location: [facility];   Service: Podiatry;  Laterality: Right;   INCISION AND DRAINAGE Left 11/07/2020   Procedure: INCISION AND DRAINAGE;  Surgeon: Linus Galas, DPM;  Location: [facility];  Service: Podiatry;  Laterality: Left;   INCISION AND DRAINAGE ABSCESS Left 05/02/2023   Procedure: INCISION AND DRAINAGE ABSCESS LEFT LOWER EXTREMITY;  Surgeon: Annice Needy, MD;  Location: [facility];  Service: Vascular;  Laterality: Left;   IRRIGATION AND DEBRIDEMENT FOOT Left 11/11/2020   Procedure: IRRIGATION AND DEBRIDEMENT FOOT;  Surgeon: Linus Galas, DPM;  Location: [facility];  Service: Podiatry;  Laterality: Left;   METATARSAL HEAD EXCISION Right 05/15/2019   Procedure: OSTECTOMY;MET HEAD 5;  Surgeon: Gwyneth Revels, DPM;  Location: [facility];  Service: Podiatry;  Laterality: Right;   osteomylitis     ROTATOR CUFF REPAIR Left    SHOULDER ARTHROSCOPY WITH DEBRIDEMENT AND BICEP TENDON REPAIR Right 08/27/2023   Procedure: SHOULDER ARTHROSCOPY WITH INCISION AND DRAINAGE;  Surgeon: Signa Kell, MD;  Location: [facility];  Service: Orthopedics;  Laterality: Right;   Patient Active Problem List   Diagnosis Date Noted   Staphylococcal arthritis of right shoulder (HCC) 08/29/2023   Bacteremia due to Staphylococcus aureus 08/29/2023   Acute bursitis of right shoulder 08/24/2023   Iron deficiency anemia, unspecified 08/23/2023   Overweight (BMI 25.0-29.9) 08/22/2023   MRSA bacteremia 08/22/2023   Amputation stump infection (HCC) 08/20/2023   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) 06/01/2023   Chronic kidney disease, stage 3a (HCC) 06/01/2023   Tobacco  abuse 06/01/2023   Cellulitis and abscess of left leg 04/30/2023   Cellulitis of left lower extremity 04/29/2023   Tobacco use 04/29/2023   Carpal tunnel syndrome 07/12/2022   Hyperglycemia due to type 2 diabetes mellitus (HCC) 07/12/2022   Polyneuropathy 07/12/2022   Ulnar neuropathy 07/12/2022   Acute epigastric pain 07/12/2022   Gastro-esophageal reflux disease  without esophagitis 07/12/2022   Nausea and vomiting 07/12/2022   Abscess of right upper extremity 02/26/2022   Cellulitis of left lower extremity without foot    Sepsis (HCC) 09/08/2021   Hypokalemia 09/06/2021   Hyponatremia 09/06/2021   PAD (peripheral artery disease) (HCC) 05/26/2021   Acute kidney injury superimposed on chronic kidney disease (HCC) 04/03/2021   Syncope 04/03/2021   Neck pain    Abnormal weight loss 12/09/2020   Change in bowel habit 12/09/2020   Constipation 12/09/2020   Epigastric pain 12/09/2020   Drug-induced constipation 12/09/2020   Periumbilical pain 12/09/2020   Fatty liver 12/09/2020   S/P BKA (below knee amputation), left (HCC) 11/20/2020   Necrotizing fasciitis of ankle and foot (HCC) 11/07/2020   History of 2019 novel coronavirus disease (COVID-19) 09/01/2020   Major depressive disorder, recurrent, in remission (HCC) 09/01/2020   Pain management contract signed 09/07/2019   Evaluation by psychiatric service required 08/18/2019   History of depression 08/18/2019   Primary osteoarthritis of both shoulders 07/16/2019   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) 07/16/2019   Chronic pain of both shoulders 07/16/2019   Cervical radicular pain 07/16/2019   S/P cervical spinal fusion 07/16/2019   Cervical spondylosis 07/16/2019   Cervical facet joint syndrome 07/16/2019   Chronic pain syndrome 07/16/2019   Anemia of chronic disease 05/25/2019   Osteomyelitis (HCC) 05/25/2019   Diabetic foot infection (HCC) 03/05/2019   Type 2 diabetes mellitus without complication (HCC) 01/01/2019   Abscess 11/25/2018   Diabetic peripheral neuropathy associated with type 2 diabetes mellitus (HCC) 06/16/2018   Hyperlipidemia associated with type 2 diabetes mellitus (HCC) 06/16/2018   Intractable nausea and vomiting 10/21/2017   HNP (herniated nucleus pulposus), cervical 09/09/2017   Displacement of cervical intervertebral disc without myelopathy 08/27/2017    Erectile dysfunction 07/31/2017   Gastroesophageal reflux disease 07/31/2017   Rotator cuff syndrome of left shoulder 07/31/2017   Cellulitis of right leg 07/30/2016   Tobacco abuse disorder 07/30/2016   Routine general medical examination at a health care facility 01/20/2014   Diabetic neuropathy, type I diabetes mellitus (HCC) 01/20/2014   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   HLD (hyperlipidemia) 03/17/2009   DM type 2 with diabetic peripheral neuropathy (HCC) 03/02/2009   Uncontrolled diabetes mellitus with hyperglycemia (HCC) 12/08/2008   OBSTRUCTIVE SLEEP APNEA 12/08/2008   Essential hypertension 12/08/2008   Allergic rhinitis 12/15/2007   Other malaise and fatigue 12/15/2007    PCP: Barbette Reichmann, MD   REFERRING PROVIDER: Novella Olive, MD   REFERRING DIAG: osteomyelitis  THERAPY DIAG:  Abnormal posture  Difficulty in walking, not elsewhere classified  Stiffness of right shoulder, not elsewhere classified  Muscle weakness (generalized)  Rationale for Evaluation and Treatment: Rehabilitation  ONSET DATE: 08/29/23  SUBJECTIVE:   SUBJECTIVE STATEMENT: Shoulder is sore form helping with a bathroom remodel   PERTINENT HISTORY: Per referring physician note: Hospital Course: 47yo with medical history significant of uncontrolled type 2 diabetes, tobacco abuse, hypertension, hyperlipidemia, and history of BKA who presented on 10/1 with diabetic stump infection.  Patient is seen by vascular surgery, started on broad-spectrum antibiotics with cefepime, Flagyl and vancomycin.  MRI of the left lower extremity showed soft tissue abscess, tibial osteomyelitis, knee joint effusion. Blood culture 1 set positive for Staph aureus, epidermidis and strep agalactiae.  Patient seen by ID, treated with vancomycin then changed to daptomycin.  Patient also found to have significant bursitis in the right shoulder MRI, joint aspiration consistent with septic joint.  Joint washout on  10/8. Acute on chronic kidney disease, uncontrolled DM2, reflux, tobacco abuse, anemia-chronic, HTN, hyperlipidemia Per Orthopedic surgeon note F/U 09/10/23: Impression: Status post arthroscopy of right shoulder [Z98.890] Status post right shoulder irrigation and debridement for septic shoulder (primary encounter diagnosis)  Plan:  1. Continue with physical therapy. Discontinue sling. Progress with range of motion exercises. Continue with IV antibiotics and follow-up with ID. Follow-up with Dr. Allena Katz in 4 weeks for 6-week postop check. This note was generated in part with voice recognition software and I apologize for any typographical errors that were not detected and corrected.    PAIN:  Are you having pain? Patient denies pain, but soreness after using his R arm  PRECAUTIONS: Fall  RED FLAGS: None   WEIGHT BEARING RESTRICTIONS: No  FALLS:  Has patient fallen in last 6 months? No  LIVING ENVIRONMENT: Lives with: lives with their family Lives in: House/apartment Stairs: No ramped entrance Has following equipment at home: Dan Humphreys - 2 wheeled and Wheelchair (manual)  OCCUPATION: Not at this time.  PLOF: Independent  PATIENT GOALS: Patient would like to recover the strength and ROM in his R shoulder to be able to use it more functionally. Right now it is too weak to use normally  NEXT MD VISIT: Allena Katz- around 3-5 weeks  OBJECTIVE:  Note: Objective measures were completed at Evaluation unless otherwise noted.  DIAGNOSTIC FINDINGS:  MRI L knee 08/20/23 IMPRESSION: 1. New small soft tissue abscess extending inferiorly from the tibial stump. 2. Significant interval increase in T2 hyperintensity and enhancement throughout the remaining tibia, highly suspicious for osteomyelitis. 3. Enlarging knee joint effusion with mild synovial enhancement, nonspecific. Cannot exclude early septic arthritis. No evidence of osteomyelitis within the distal femur, patella or proximal  fibula.  R shoulder MRI 08/22/23 IMPRESSION: 1. Prominent distal anterior supraspinatus tendinopathy with partial thickness articular surface tearing extending towards but not definitely reaching the bursal surface. 2. Moderate to prominent subscapularis and mild infraspinatus tendinopathy. 3. Moderate tendinopathy of the intra-articular segment of the long head of the biceps. 4. Moderate degenerative arthropathy of the [facility] joint. 5. Extensive fluid in the subacromial subdeltoid bursa compatible with bursitis. 6. Substantial joint effusion with fluid and potentially synovitis in the rotator interval. 7. Abnormal intramuscular edema anteriorly and laterally in the deltoid muscle, and along the periphery of the posterior deltoid. Appearance favors muscle strain/tear.  COGNITION: Overall cognitive status: Within functional limits for tasks assessed     SENSATION: Patient reports N & T in R forearm and 4th and 5th digits.  EDEMA:  Patient denies swelling  POSTURE: rounded shoulders and R scapula held in depression and abd and slight downward rotation  PALPATION: TTP medial scap border, supraspinatus tendon, deltoid  UPPER EXTREMITY ROM: L WNL, R shoulder AROM-flex 43, abd 67, ER 15, IR WNL, B elbows lack full ext   UPPER EXTREMITY MMT: LUE WNL, R shoulder- 2+/5, elbow, wrist, hand 4/5, Shoulder shrug 5/5 B, able to activate scapular stabilizers.   UPPER EXTREMITY SPECIAL TESTS:  Full can, empty can drop arm all (+), belly press, lift off (+), additional testing deferred due to inability to assume test  position  JOINT MOBILITY TESTING: Scapular mobility WNL, GH joint severely restricted in all planes  FUNCTIONAL TESTS:  Quick Dash- TBD  GAIT: Distance walked: In clinic distances Assistive device utilized: None Level of assistance: Modified independence Comments: Wearing his old prosthesis which is partially broken. Told to limit time on the limb. He is getting a new  prosthesis in about a week.   TODAY'S TREATMENT:                                                                                                                              DATE:  10/11/23 UBE L2.5 x 6 min RUE AROM   Flex 102  Abd 80  ER 49 RUE Flex x10 & abd x5 up wall w/ pillow case  Seated Rows & Lats 25lb 2x10 Chest press 10lb 2x10 RUE ER/IR yellow 2x12 RUE PROM with end range holds  R GH mobs 2-3 Supine RUE ER/IR 2lb x 10    10/08/23 UBE L2 x 3 min each Seated Rows & Lats 25lb 2x10 RUE ER/IR yellow 2x10 AAROM Flex, Ext, IR up back 2lb WaTE x10, LUE  abd dowel x10 Shoulder abd 2x10 RUE PROM with end range holds  R GH mobs 2-3  10/02/23 UBE L1 x 3 min each AAROM Flex, Ext, IR up back 1lb WaTE x10, LUE  abd dowel x10 Rows green 2x10 Shoulder Ext green x10 RUE ER/IR yellow 2x10  RUE PROM with end range holds  R HG mobs 2-3  09/26/23 Education  PATIENT EDUCATION:  Education details: POC Person educated: Patient Education method: Explanation Education comprehension: verbalized understanding  HOME EXERCISE PROGRAM: Verbally instructed in B scap retraction, seated press ups, wall slides. [id number]  ASSESSMENT:  CLINICAL IMPRESSION: Patient is a 46 y.o. who was seen today for physical therapy treatment for L Residual limb osteomyelitis, R shoulder bursitis, multiple tendinopathies, and sepsis. Pt enters with reports of sourness int his R shoulder. Despite complaints he has progressed increasing his R shoulder AROM. He has full R shoulder PROM.  Continued with RUE strength, scapular stability, and ROM. R shoulder ER and abduction is very weak. Pt will benefit from skilled PT to increase R shoulder strength to improve his function.  OBJECTIVE IMPAIRMENTS: decreased activity tolerance, decreased balance, decreased mobility, difficulty walking, decreased ROM, decreased strength, impaired flexibility, impaired UE functional use, postural dysfunction, and pain.    ACTIVITY LIMITATIONS: carrying, lifting, bending, standing, stairs, transfers, and locomotion level  PARTICIPATION LIMITATIONS: meal prep, cleaning, laundry, driving, shopping, and community activity  PERSONAL FACTORS: Past/current experiences are also affecting patient's functional outcome.   REHAB POTENTIAL: Good  CLINICAL DECISION MAKING: Evolving/moderate complexity  EVALUATION COMPLEXITY: High   GOALS: Goals reviewed with patient? No  SHORT TERM GOALS: Target date: 10/17/23 I with initial HEP Baseline: Goal status: Met 10/08/23  2.  Increase R shoulder AROM by at least 10 degrees in flex, abd, ER Baseline:  Goal status: Met 10/11/23  3.  Increase PROM of R shoulder to 90 + degrees in flexion and abd, 45 degrees in ER Baseline:  Goal status: Met 10/11/23  LONG TERM GOALS: Target date: 12/05/23  I with final HEP Baseline:  Goal status: INITIAL  2.  Increase R shoulder AROM to at least 120 degrees for flex and abd, 80 degrees ER Baseline:  Goal status: Met 10/11/23  3.  Increase R shoulder strength to at least 4/5 in all planes of movement Baseline:  Goal status: INITIAL  4.  Improve scapular stabilized position on trunk to equal L scapula Baseline: R scap sits in depression, abd, downward rotation Goal status: INITIAL  5.  Patient will report the ability to use his RUE functionally 80% of the time as needed Baseline:  Goal status: INITIAL  6.  Improve Quick Dash score by at least 9% to indicate clinically relevant improvement. Baseline:  Goal status: INITIAL   PLAN:  PT FREQUENCY: 2x/week  PT DURATION: 10 weeks  PLANNED INTERVENTIONS: 97110-Therapeutic exercises, 97530- Therapeutic activity, 97112- Neuromuscular re-education, 97535- Self Care, [id number]- Manual therapy, [id number]- Gait training, 97014- Electrical stimulation (unattended), [phone]- Ionotophoresis 4mg /ml Dexamethasone, Patient/Family education, Balance training, Dry Needling, Joint  mobilization, Cryotherapy, and Moist heat  PLAN FOR NEXT SESSION: A and PROM of R shoulder, joint mobs, STM, initiate strengthening and stability re-education. Neldon Mc, HEP   Iona Beard, DPT 10/11/2023, 8:49 AM

## 2023-10-15 ENCOUNTER — Ambulatory Visit: Payer: Medicaid Other | Admitting: Physical Therapy

## 2023-10-15 ENCOUNTER — Encounter: Payer: Self-pay | Admitting: Physical Therapy

## 2023-10-15 DIAGNOSIS — R262 Difficulty in walking, not elsewhere classified: Secondary | ICD-10-CM

## 2023-10-15 DIAGNOSIS — M25511 Pain in right shoulder: Secondary | ICD-10-CM

## 2023-10-15 DIAGNOSIS — R293 Abnormal posture: Secondary | ICD-10-CM | POA: Diagnosis not present

## 2023-10-15 DIAGNOSIS — M6281 Muscle weakness (generalized): Secondary | ICD-10-CM

## 2023-10-15 DIAGNOSIS — M25611 Stiffness of right shoulder, not elsewhere classified: Secondary | ICD-10-CM

## 2023-10-15 NOTE — Therapy (Signed)
OUTPATIENT PHYSICAL THERAPY UPPER EXTREMITY EVALUATION   Patient Name: Richard Mendoza MRN: [id number] DOB:03-29-1976, 47 y.o., male Today's Date: 10/15/2023  END OF SESSION:  PT End of Session - 10/15/23 0848     Visit Number 5    Date for PT Re-Evaluation 12/05/23    PT Start Time 0846    PT Stop Time 0930    PT Time Calculation (min) 44 min    Activity Tolerance Patient tolerated treatment well    Behavior During Therapy [facility] for tasks assessed/performed             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   Past Surgical History:  Procedure Laterality Date   AMPUTATION Left 11/07/2020   Procedure: 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;  Surgeon: Linus Galas, DPM;  Location: [facility];  Service: Podiatry;  Laterality: Left;   AMPUTATION Left 11/16/2020   Procedure: AMPUTATION BELOW KNEE;  Surgeon: Annice Needy, MD;  Location: [facility];  Service: Vascular;  Laterality: Left;   ANTERIOR CERVICAL DECOMP/DISCECTOMY FUSION N/A 09/09/2017   Procedure: ANTERIOR CERVICAL DECOMPRESSION/DISCECTOMY FUSION CERVICAL 6- CERVICAL 7;  Surgeon: Coletta Memos, MD;  Location: [facility];  Service: Neurosurgery;  Laterality: N/A;  ANTERIOR CERVICAL DECOMPRESSION/DISCECTOMY FUSION CERVICAL 6- CERVICAL 7   APPENDECTOMY     I & D EXTREMITY Right 10/03/2017   Procedure: IRRIGATION AND DEBRIDEMENT RIGHT WRIST;  Surgeon: Betha Loa, MD;  Location: [facility];  Service: Orthopedics;  Laterality: Right;   I & D EXTREMITY Right 11/26/2018   Procedure: IRRIGATION AND DEBRIDEMENT FASCIA ON RIGHT FOOT;  Surgeon: Gwyneth Revels, DPM;  Location: [facility];  Service: Podiatry;  Laterality: Right;   INCISION AND DRAINAGE Right 03/06/2019   Procedure: INCISION AND DRAINAGE RIGHT FOOT, WITH 4th RAY AMPUTATION;  Surgeon: Gwyneth Revels, DPM;  Location: [facility];   Service: Podiatry;  Laterality: Right;   INCISION AND DRAINAGE Left 11/07/2020   Procedure: INCISION AND DRAINAGE;  Surgeon: Linus Galas, DPM;  Location: [facility];  Service: Podiatry;  Laterality: Left;   INCISION AND DRAINAGE ABSCESS Left 05/02/2023   Procedure: INCISION AND DRAINAGE ABSCESS LEFT LOWER EXTREMITY;  Surgeon: Annice Needy, MD;  Location: [facility];  Service: Vascular;  Laterality: Left;   IRRIGATION AND DEBRIDEMENT FOOT Left 11/11/2020   Procedure: IRRIGATION AND DEBRIDEMENT FOOT;  Surgeon: Linus Galas, DPM;  Location: [facility];  Service: Podiatry;  Laterality: Left;   METATARSAL HEAD EXCISION Right 05/15/2019   Procedure: OSTECTOMY;MET HEAD 5;  Surgeon: Gwyneth Revels, DPM;  Location: [facility];  Service: Podiatry;  Laterality: Right;   osteomylitis     ROTATOR CUFF REPAIR Left    SHOULDER ARTHROSCOPY WITH DEBRIDEMENT AND BICEP TENDON REPAIR Right 08/27/2023   Procedure: SHOULDER ARTHROSCOPY WITH INCISION AND DRAINAGE;  Surgeon: Signa Kell, MD;  Location: [facility];  Service: Orthopedics;  Laterality: Right;   Patient Active Problem List   Diagnosis Date Noted   Staphylococcal arthritis of right shoulder (HCC) 08/29/2023   Bacteremia due to Staphylococcus aureus 08/29/2023   Acute bursitis of right shoulder 08/24/2023   Iron deficiency anemia, unspecified 08/23/2023   Overweight (BMI 25.0-29.9) 08/22/2023   MRSA bacteremia 08/22/2023   Amputation stump infection (HCC) 08/20/2023   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) 06/01/2023   Chronic kidney disease, stage 3a (HCC) 06/01/2023   Tobacco  abuse 06/01/2023   Cellulitis and abscess of left leg 04/30/2023   Cellulitis of left lower extremity 04/29/2023   Tobacco use 04/29/2023   Carpal tunnel syndrome 07/12/2022   Hyperglycemia due to type 2 diabetes mellitus (HCC) 07/12/2022   Polyneuropathy 07/12/2022   Ulnar neuropathy 07/12/2022   Acute epigastric pain 07/12/2022   Gastro-esophageal reflux disease  without esophagitis 07/12/2022   Nausea and vomiting 07/12/2022   Abscess of right upper extremity 02/26/2022   Cellulitis of left lower extremity without foot    Sepsis (HCC) 09/08/2021   Hypokalemia 09/06/2021   Hyponatremia 09/06/2021   PAD (peripheral artery disease) (HCC) 05/26/2021   Acute kidney injury superimposed on chronic kidney disease (HCC) 04/03/2021   Syncope 04/03/2021   Neck pain    Abnormal weight loss 12/09/2020   Change in bowel habit 12/09/2020   Constipation 12/09/2020   Epigastric pain 12/09/2020   Drug-induced constipation 12/09/2020   Periumbilical pain 12/09/2020   Fatty liver 12/09/2020   S/P BKA (below knee amputation), left (HCC) 11/20/2020   Necrotizing fasciitis of ankle and foot (HCC) 11/07/2020   History of 2019 novel coronavirus disease (COVID-19) 09/01/2020   Major depressive disorder, recurrent, in remission (HCC) 09/01/2020   Pain management contract signed 09/07/2019   Evaluation by psychiatric service required 08/18/2019   History of depression 08/18/2019   Primary osteoarthritis of both shoulders 07/16/2019   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) 07/16/2019   Chronic pain of both shoulders 07/16/2019   Cervical radicular pain 07/16/2019   S/P cervical spinal fusion 07/16/2019   Cervical spondylosis 07/16/2019   Cervical facet joint syndrome 07/16/2019   Chronic pain syndrome 07/16/2019   Anemia of chronic disease 05/25/2019   Osteomyelitis (HCC) 05/25/2019   Diabetic foot infection (HCC) 03/05/2019   Type 2 diabetes mellitus without complication (HCC) 01/01/2019   Abscess 11/25/2018   Diabetic peripheral neuropathy associated with type 2 diabetes mellitus (HCC) 06/16/2018   Hyperlipidemia associated with type 2 diabetes mellitus (HCC) 06/16/2018   Intractable nausea and vomiting 10/21/2017   HNP (herniated nucleus pulposus), cervical 09/09/2017   Displacement of cervical intervertebral disc without myelopathy 08/27/2017    Erectile dysfunction 07/31/2017   Gastroesophageal reflux disease 07/31/2017   Rotator cuff syndrome of left shoulder 07/31/2017   Cellulitis of right leg 07/30/2016   Tobacco abuse disorder 07/30/2016   Routine general medical examination at a health care facility 01/20/2014   Diabetic neuropathy, type I diabetes mellitus (HCC) 01/20/2014   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   HLD (hyperlipidemia) 03/17/2009   DM type 2 with diabetic peripheral neuropathy (HCC) 03/02/2009   Uncontrolled diabetes mellitus with hyperglycemia (HCC) 12/08/2008   OBSTRUCTIVE SLEEP APNEA 12/08/2008   Essential hypertension 12/08/2008   Allergic rhinitis 12/15/2007   Other malaise and fatigue 12/15/2007    PCP: Barbette Reichmann, MD   REFERRING PROVIDER: Novella Olive, MD   REFERRING DIAG: osteomyelitis  THERAPY DIAG:  Abnormal posture  Difficulty in walking, not elsewhere classified  Stiffness of right shoulder, not elsewhere classified  Muscle weakness (generalized)  Acute pain of right shoulder  Rationale for Evaluation and Treatment: Rehabilitation  ONSET DATE: 08/29/23  SUBJECTIVE:   SUBJECTIVE STATEMENT: Was doing good up until yesterday, pt reports that his shoulder felt loose over the weekends, but tighten up on him yesterday.   PERTINENT HISTORY: Per referring physician note: Hospital Course: 47yo with medical history significant of uncontrolled type 2 diabetes, tobacco abuse, hypertension, hyperlipidemia, and history of BKA who presented on 10/1 with  diabetic stump infection.  Patient is seen by vascular surgery, started on broad-spectrum antibiotics with cefepime, Flagyl and vancomycin.  MRI of the left lower extremity showed soft tissue abscess, tibial osteomyelitis, knee joint effusion. Blood culture 1 set positive for Staph aureus, epidermidis and strep agalactiae.  Patient seen by ID, treated with vancomycin then changed to daptomycin.  Patient also found to have significant  bursitis in the right shoulder MRI, joint aspiration consistent with septic joint.  Joint washout on 10/8. Acute on chronic kidney disease, uncontrolled DM2, reflux, tobacco abuse, anemia-chronic, HTN, hyperlipidemia Per Orthopedic surgeon note F/U 09/10/23: Impression: Status post arthroscopy of right shoulder [Z98.890] Status post right shoulder irrigation and debridement for septic shoulder (primary encounter diagnosis)  Plan:  1. Continue with physical therapy. Discontinue sling. Progress with range of motion exercises. Continue with IV antibiotics and follow-up with ID. Follow-up with Dr. Allena Katz in 4 weeks for 6-week postop check. This note was generated in part with voice recognition software and I apologize for any typographical errors that were not detected and corrected.    PAIN:  Are you having pain? Patient denies pain, but soreness after using his R arm  PRECAUTIONS: Fall  RED FLAGS: None   WEIGHT BEARING RESTRICTIONS: No  FALLS:  Has patient fallen in last 6 months? No  LIVING ENVIRONMENT: Lives with: lives with their family Lives in: House/apartment Stairs: No ramped entrance Has following equipment at home: Dan Humphreys - 2 wheeled and Wheelchair (manual)  OCCUPATION: Not at this time.  PLOF: Independent  PATIENT GOALS: Patient would like to recover the strength and ROM in his R shoulder to be able to use it more functionally. Right now it is too weak to use normally  NEXT MD VISIT: Allena Katz- around 3-5 weeks  OBJECTIVE:  Note: Objective measures were completed at Evaluation unless otherwise noted.  DIAGNOSTIC FINDINGS:  MRI L knee 08/20/23 IMPRESSION: 1. New small soft tissue abscess extending inferiorly from the tibial stump. 2. Significant interval increase in T2 hyperintensity and enhancement throughout the remaining tibia, highly suspicious for osteomyelitis. 3. Enlarging knee joint effusion with mild synovial enhancement, nonspecific. Cannot exclude early  septic arthritis. No evidence of osteomyelitis within the distal femur, patella or proximal fibula.  R shoulder MRI 08/22/23 IMPRESSION: 1. Prominent distal anterior supraspinatus tendinopathy with partial thickness articular surface tearing extending towards but not definitely reaching the bursal surface. 2. Moderate to prominent subscapularis and mild infraspinatus tendinopathy. 3. Moderate tendinopathy of the intra-articular segment of the long head of the biceps. 4. Moderate degenerative arthropathy of the [facility] joint. 5. Extensive fluid in the subacromial subdeltoid bursa compatible with bursitis. 6. Substantial joint effusion with fluid and potentially synovitis in the rotator interval. 7. Abnormal intramuscular edema anteriorly and laterally in the deltoid muscle, and along the periphery of the posterior deltoid. Appearance favors muscle strain/tear.  COGNITION: Overall cognitive status: Within functional limits for tasks assessed     SENSATION: Patient reports N & T in R forearm and 4th and 5th digits.  EDEMA:  Patient denies swelling  POSTURE: rounded shoulders and R scapula held in depression and abd and slight downward rotation  PALPATION: TTP medial scap border, supraspinatus tendon, deltoid  UPPER EXTREMITY ROM: L WNL, R shoulder AROM-flex 43, abd 67, ER 15, IR WNL, B elbows lack full ext   UPPER EXTREMITY MMT: LUE WNL, R shoulder- 2+/5, elbow, wrist, hand 4/5, Shoulder shrug 5/5 B, able to activate scapular stabilizers.   UPPER EXTREMITY SPECIAL TESTS:  Full can,  empty can drop arm all (+), belly press, lift off (+), additional testing deferred due to inability to assume test position  JOINT MOBILITY TESTING: Scapular mobility WNL, GH joint severely restricted in all planes  FUNCTIONAL TESTS:  Quick Dash- TBD  GAIT: Distance walked: In clinic distances Assistive device utilized: None Level of assistance: Modified independence Comments: Wearing his old  prosthesis which is partially broken. Told to limit time on the limb. He is getting a new prosthesis in about a week.   TODAY'S TREATMENT:                                                                                                                              DATE:  10/15/23 UBE L2.5 x 3 min each  Seated Rows & Lats 25lb 2x15 Overhead circles pillow case on wall CW/CCW x10 Seated OHP 3lb WaTE 2x10 RUE ER/IR yellow 2x12 RUE PROM with end range holds  R GH mobs 2-3 Supine RUE ER/IR 2lb x 10   10/11/23 UBE L2.5 x 6 min RUE AROM   Flex 102  Abd 80  ER 49 RUE Flex x10 & abd x5 up wall w/ pillow case  Seated Rows & Lats 25lb 2x10 Chest press 10lb 2x10 RUE ER/IR yellow 2x12 RUE PROM with end range holds  R GH mobs 2-3 Supine RUE ER/IR 2lb x 10    10/08/23 UBE L2 x 3 min each Seated Rows & Lats 25lb 2x10 RUE ER/IR yellow 2x10 AAROM Flex, Ext, IR up back 2lb WaTE x10, LUE  abd dowel x10 Shoulder abd 2x10 RUE PROM with end range holds  R GH mobs 2-3  10/02/23 UBE L1 x 3 min each AAROM Flex, Ext, IR up back 1lb WaTE x10, LUE  abd dowel x10 Rows green 2x10 Shoulder Ext green x10 RUE ER/IR yellow 2x10  RUE PROM with end range holds  R HG mobs 2-3  09/26/23 Education  PATIENT EDUCATION:  Education details: POC Person educated: Patient Education method: Explanation Education comprehension: verbalized understanding  HOME EXERCISE PROGRAM: Verbally instructed in B scap retraction, seated press ups, wall slides. [id number]  ASSESSMENT:  CLINICAL IMPRESSION: Patient is a 47 y.o. who was seen today for physical therapy treatment for L Residual limb osteomyelitis, R shoulder bursitis, multiple tendinopathies, and sepsis. Pt enters with reports of sourness int his R shoulder. Pt repots a few days of improvement followed by a day of tightness and discomfort. Increase reps tolerated with seated rows and lats. More emphasize on overhead work due to pt reporting of an upcoming  project of hanging sheet rock. All interventions completed well. Increase fatigue with overhead activities. Full PROM achieved during session. Pt will benefit from skilled PT to increase R shoulder strength to improve his function.  OBJECTIVE IMPAIRMENTS: decreased activity tolerance, decreased balance, decreased mobility, difficulty walking, decreased ROM, decreased strength, impaired flexibility, impaired UE functional use, postural dysfunction, and pain.   ACTIVITY LIMITATIONS: carrying, lifting, bending, standing,  stairs, transfers, and locomotion level  PARTICIPATION LIMITATIONS: meal prep, cleaning, laundry, driving, shopping, and community activity  PERSONAL FACTORS: Past/current experiences are also affecting patient's functional outcome.   REHAB POTENTIAL: Good  CLINICAL DECISION MAKING: Evolving/moderate complexity  EVALUATION COMPLEXITY: High   GOALS: Goals reviewed with patient? No  SHORT TERM GOALS: Target date: 10/17/23 I with initial HEP Baseline: Goal status: Met 10/08/23  2.  Increase R shoulder AROM by at least 10 degrees in flex, abd, ER Baseline:  Goal status: Met 10/11/23  3.  Increase PROM of R shoulder to 90 + degrees in flexion and abd, 45 degrees in ER Baseline:  Goal status: Met 10/11/23  LONG TERM GOALS: Target date: 12/05/23  I with final HEP Baseline:  Goal status: INITIAL  2.  Increase R shoulder AROM to at least 120 degrees for flex and abd, 80 degrees ER Baseline:  Goal status: Met 10/11/23  3.  Increase R shoulder strength to at least 4/5 in all planes of movement Baseline:  Goal status: INITIAL  4.  Improve scapular stabilized position on trunk to equal L scapula Baseline: R scap sits in depression, abd, downward rotation Goal status: INITIAL  5.  Patient will report the ability to use his RUE functionally 80% of the time as needed Baseline:  Goal status: INITIAL  6.  Improve Quick Dash score by at least 9% to indicate clinically  relevant improvement. Baseline:  Goal status: INITIAL   PLAN:  PT FREQUENCY: 2x/week  PT DURATION: 10 weeks  PLANNED INTERVENTIONS: 97110-Therapeutic exercises, 97530- Therapeutic activity, [id number]- Neuromuscular re-education, 97535- Self Care, [id number]- Manual therapy, [id number]- Gait training, 97014- Electrical stimulation (unattended), [phone]- Ionotophoresis 4mg /ml Dexamethasone, Patient/Family education, Balance training, Dry Needling, Joint mobilization, Cryotherapy, and Moist heat  PLAN FOR NEXT SESSION: A and PROM of R shoulder, joint mobs, STM, initiate strengthening and stability re-education. Neldon Mc, HEP   Iona Beard, DPT 10/15/2023, 8:48 AM

## 2023-10-20 DIAGNOSIS — R269 Unspecified abnormalities of gait and mobility: Secondary | ICD-10-CM | POA: Diagnosis not present

## 2023-10-30 DIAGNOSIS — Z89512 Acquired absence of left leg below knee: Secondary | ICD-10-CM | POA: Diagnosis not present

## 2023-11-02 DIAGNOSIS — R269 Unspecified abnormalities of gait and mobility: Secondary | ICD-10-CM | POA: Diagnosis not present

## 2023-11-20 DIAGNOSIS — R269 Unspecified abnormalities of gait and mobility: Secondary | ICD-10-CM | POA: Diagnosis not present

## 2023-12-03 DIAGNOSIS — R269 Unspecified abnormalities of gait and mobility: Secondary | ICD-10-CM | POA: Diagnosis not present

## 2023-12-19 LAB — MINIMUM INHIBITORY CONC. (1 DRUG)

## 2023-12-21 DIAGNOSIS — R269 Unspecified abnormalities of gait and mobility: Secondary | ICD-10-CM | POA: Diagnosis not present

## 2024-01-03 DIAGNOSIS — R269 Unspecified abnormalities of gait and mobility: Secondary | ICD-10-CM | POA: Diagnosis not present

## 2024-01-18 DIAGNOSIS — R269 Unspecified abnormalities of gait and mobility: Secondary | ICD-10-CM | POA: Diagnosis not present

## 2024-01-23 ENCOUNTER — Inpatient Hospital Stay

## 2024-01-23 ENCOUNTER — Emergency Department

## 2024-01-23 ENCOUNTER — Other Ambulatory Visit: Payer: Self-pay

## 2024-01-23 ENCOUNTER — Inpatient Hospital Stay
Admission: EM | Admit: 2024-01-23 | Discharge: 2024-02-05 | DRG: 475 | Disposition: A | Discharge status: Skilled Nursing Facility | Attending: Osteopathic Medicine | Admitting: Osteopathic Medicine

## 2024-01-23 ENCOUNTER — Encounter: Payer: Self-pay | Admitting: Emergency Medicine

## 2024-01-23 DIAGNOSIS — E1169 Type 2 diabetes mellitus with other specified complication: Secondary | ICD-10-CM | POA: Diagnosis not present

## 2024-01-23 DIAGNOSIS — M86162 Other acute osteomyelitis, left tibia and fibula: Secondary | ICD-10-CM | POA: Diagnosis not present

## 2024-01-23 DIAGNOSIS — E559 Vitamin D deficiency, unspecified: Secondary | ICD-10-CM | POA: Diagnosis present

## 2024-01-23 DIAGNOSIS — Z9641 Presence of insulin pump (external) (internal): Secondary | ICD-10-CM | POA: Diagnosis not present

## 2024-01-23 DIAGNOSIS — I10 Essential (primary) hypertension: Secondary | ICD-10-CM | POA: Diagnosis not present

## 2024-01-23 DIAGNOSIS — Y835 Amputation of limb(s) as the cause of abnormal reaction of the patient, or of later complication, without mention of misadventure at the time of the procedure: Secondary | ICD-10-CM | POA: Diagnosis present

## 2024-01-23 DIAGNOSIS — T8744 Infection of amputation stump, left lower extremity: Principal | ICD-10-CM | POA: Diagnosis present

## 2024-01-23 DIAGNOSIS — E1122 Type 2 diabetes mellitus with diabetic chronic kidney disease: Secondary | ICD-10-CM | POA: Diagnosis not present

## 2024-01-23 DIAGNOSIS — E876 Hypokalemia: Secondary | ICD-10-CM | POA: Diagnosis not present

## 2024-01-23 DIAGNOSIS — Z89512 Acquired absence of left leg below knee: Secondary | ICD-10-CM

## 2024-01-23 DIAGNOSIS — Z9889 Other specified postprocedural states: Secondary | ICD-10-CM | POA: Diagnosis not present

## 2024-01-23 DIAGNOSIS — E874 Mixed disorder of acid-base balance: Secondary | ICD-10-CM | POA: Diagnosis present

## 2024-01-23 DIAGNOSIS — E663 Overweight: Secondary | ICD-10-CM | POA: Diagnosis present

## 2024-01-23 DIAGNOSIS — Z8659 Personal history of other mental and behavioral disorders: Secondary | ICD-10-CM

## 2024-01-23 DIAGNOSIS — I129 Hypertensive chronic kidney disease with stage 1 through stage 4 chronic kidney disease, or unspecified chronic kidney disease: Secondary | ICD-10-CM | POA: Diagnosis not present

## 2024-01-23 DIAGNOSIS — Z885 Allergy status to narcotic agent status: Secondary | ICD-10-CM

## 2024-01-23 DIAGNOSIS — E871 Hypo-osmolality and hyponatremia: Secondary | ICD-10-CM | POA: Diagnosis not present

## 2024-01-23 DIAGNOSIS — E1165 Type 2 diabetes mellitus with hyperglycemia: Secondary | ICD-10-CM | POA: Diagnosis present

## 2024-01-23 DIAGNOSIS — I70262 Atherosclerosis of native arteries of extremities with gangrene, left leg: Secondary | ICD-10-CM | POA: Diagnosis not present

## 2024-01-23 DIAGNOSIS — L97826 Non-pressure chronic ulcer of other part of left lower leg with bone involvement without evidence of necrosis: Secondary | ICD-10-CM | POA: Diagnosis present

## 2024-01-23 DIAGNOSIS — F3340 Major depressive disorder, recurrent, in remission, unspecified: Secondary | ICD-10-CM | POA: Diagnosis not present

## 2024-01-23 DIAGNOSIS — K219 Gastro-esophageal reflux disease without esophagitis: Secondary | ICD-10-CM | POA: Diagnosis not present

## 2024-01-23 DIAGNOSIS — D631 Anemia in chronic kidney disease: Secondary | ICD-10-CM | POA: Diagnosis present

## 2024-01-23 DIAGNOSIS — Z833 Family history of diabetes mellitus: Secondary | ICD-10-CM

## 2024-01-23 DIAGNOSIS — N1831 Chronic kidney disease, stage 3a: Secondary | ICD-10-CM | POA: Diagnosis present

## 2024-01-23 DIAGNOSIS — Z981 Arthrodesis status: Secondary | ICD-10-CM

## 2024-01-23 DIAGNOSIS — L03116 Cellulitis of left lower limb: Secondary | ICD-10-CM | POA: Diagnosis present

## 2024-01-23 DIAGNOSIS — M79605 Pain in left leg: Secondary | ICD-10-CM | POA: Diagnosis not present

## 2024-01-23 DIAGNOSIS — Z794 Long term (current) use of insulin: Secondary | ICD-10-CM | POA: Diagnosis not present

## 2024-01-23 DIAGNOSIS — D62 Acute posthemorrhagic anemia: Secondary | ICD-10-CM | POA: Diagnosis not present

## 2024-01-23 DIAGNOSIS — L0390 Cellulitis, unspecified: Secondary | ICD-10-CM | POA: Diagnosis not present

## 2024-01-23 DIAGNOSIS — Z89612 Acquired absence of left leg above knee: Secondary | ICD-10-CM

## 2024-01-23 DIAGNOSIS — Z720 Tobacco use: Secondary | ICD-10-CM | POA: Diagnosis present

## 2024-01-23 DIAGNOSIS — E785 Hyperlipidemia, unspecified: Secondary | ICD-10-CM | POA: Diagnosis not present

## 2024-01-23 DIAGNOSIS — N179 Acute kidney failure, unspecified: Secondary | ICD-10-CM | POA: Diagnosis not present

## 2024-01-23 DIAGNOSIS — M86662 Other chronic osteomyelitis, left tibia and fibula: Secondary | ICD-10-CM | POA: Diagnosis not present

## 2024-01-23 DIAGNOSIS — E1152 Type 2 diabetes mellitus with diabetic peripheral angiopathy with gangrene: Secondary | ICD-10-CM | POA: Diagnosis present

## 2024-01-23 DIAGNOSIS — Z79899 Other long term (current) drug therapy: Secondary | ICD-10-CM

## 2024-01-23 DIAGNOSIS — L02416 Cutaneous abscess of left lower limb: Secondary | ICD-10-CM | POA: Diagnosis not present

## 2024-01-23 DIAGNOSIS — M869 Osteomyelitis, unspecified: Secondary | ICD-10-CM | POA: Diagnosis not present

## 2024-01-23 DIAGNOSIS — S78112A Complete traumatic amputation at level between left hip and knee, initial encounter: Secondary | ICD-10-CM | POA: Diagnosis not present

## 2024-01-23 DIAGNOSIS — Z8249 Family history of ischemic heart disease and other diseases of the circulatory system: Secondary | ICD-10-CM

## 2024-01-23 DIAGNOSIS — I739 Peripheral vascular disease, unspecified: Secondary | ICD-10-CM | POA: Diagnosis present

## 2024-01-23 DIAGNOSIS — M86352 Chronic multifocal osteomyelitis, left femur: Secondary | ICD-10-CM | POA: Diagnosis not present

## 2024-01-23 DIAGNOSIS — F17210 Nicotine dependence, cigarettes, uncomplicated: Secondary | ICD-10-CM | POA: Diagnosis present

## 2024-01-23 LAB — COMPREHENSIVE METABOLIC PANEL
ALT: 15 U/L (ref 0–44)
AST: 31 U/L (ref 15–41)
Albumin: 3 g/dL — ABNORMAL LOW (ref 3.5–5.0)
Alkaline Phosphatase: 110 U/L (ref 38–126)
Anion gap: 10 (ref 5–15)
BUN: 28 mg/dL — ABNORMAL HIGH (ref 6–20)
CO2: 18 mmol/L — ABNORMAL LOW (ref 22–32)
Calcium: 8.7 mg/dL — ABNORMAL LOW (ref 8.9–10.3)
Chloride: 97 mmol/L — ABNORMAL LOW (ref 98–111)
Creatinine, Ser: 2.51 mg/dL — ABNORMAL HIGH (ref 0.61–1.24)
GFR, Estimated: 31 mL/min — ABNORMAL LOW (ref >60)
Glucose, Bld: 622 mg/dL (ref 70–99)
Potassium: 2.4 mmol/L — CL (ref 3.5–5.1)
Sodium: 125 mmol/L — ABNORMAL LOW (ref 135–145)
Total Bilirubin: 1 mg/dL (ref 0.0–1.2)
Total Protein: 7.7 g/dL (ref 6.5–8.1)

## 2024-01-23 LAB — CBC WITH DIFFERENTIAL/PLATELET
Abs Immature Granulocytes: 0.12 10*3/uL — ABNORMAL HIGH (ref 0.00–0.07)
Basophils Absolute: 0 10*3/uL (ref 0.0–0.1)
Basophils Relative: 0 %
Eosinophils Absolute: 0 10*3/uL (ref 0.0–0.5)
Eosinophils Relative: 0 %
HCT: 34.7 % — ABNORMAL LOW (ref 39.0–52.0)
Hemoglobin: 12 g/dL — ABNORMAL LOW (ref 13.0–17.0)
Immature Granulocytes: 1 %
Lymphocytes Relative: 6 %
Lymphs Abs: 1 10*3/uL (ref 0.7–4.0)
MCH: 26.5 pg (ref 26.0–34.0)
MCHC: 34.6 g/dL (ref 30.0–36.0)
MCV: 76.8 fL — ABNORMAL LOW (ref 80.0–100.0)
Monocytes Absolute: 0.7 10*3/uL (ref 0.1–1.0)
Monocytes Relative: 4 %
Neutro Abs: 15.9 10*3/uL — ABNORMAL HIGH (ref 1.7–7.7)
Neutrophils Relative %: 89 %
Platelets: 322 10*3/uL (ref 150–400)
RBC: 4.52 MIL/uL (ref 4.22–5.81)
RDW: 15.9 % — ABNORMAL HIGH (ref 11.5–15.5)
WBC: 17.8 10*3/uL — ABNORMAL HIGH (ref 4.0–10.5)
nRBC: 0 % (ref 0.0–0.2)

## 2024-01-23 LAB — CBG MONITORING, ED
Glucose-Capillary: 563 mg/dL (ref 70–99)
Glucose-Capillary: 424 mg/dL — ABNORMAL HIGH (ref 70–99)
Glucose-Capillary: 481 mg/dL — ABNORMAL HIGH (ref 70–99)
Glucose-Capillary: 332 mg/dL — ABNORMAL HIGH (ref 70–99)

## 2024-01-23 LAB — LACTIC ACID, PLASMA
Lactic Acid, Venous: 1.1 mmol/L (ref 0.5–1.9)
Lactic Acid, Venous: 1 mmol/L (ref 0.5–1.9)

## 2024-01-23 MED ORDER — POTASSIUM CHLORIDE 10 MEQ/100ML IV SOLN
10.0000 meq | INTRAVENOUS | Status: AC
  Administered 2024-01-23 (×5): 10 meq via INTRAVENOUS
  Filled 2024-01-23 (×3): qty 100

## 2024-01-23 MED ORDER — VANCOMYCIN HCL 750 MG/150ML IV SOLN
750.0000 mg | Freq: Once | INTRAVENOUS | Status: AC
  Administered 2024-01-23: 750 mg via INTRAVENOUS
  Filled 2024-01-23: qty 150

## 2024-01-23 MED ORDER — HEPARIN SODIUM (PORCINE) 5000 UNIT/ML IJ SOLN
5000.0000 [IU] | Freq: Three times a day (TID) | INTRAMUSCULAR | Status: AC
  Administered 2024-01-23 (×2): 5000 [IU] via SUBCUTANEOUS
  Filled 2024-01-23 (×2): qty 1

## 2024-01-23 MED ORDER — ONDANSETRON HCL 4 MG/2ML IJ SOLN: 4.0000 mg | Freq: Four times a day (QID) | INTRAMUSCULAR | Status: AC | PRN

## 2024-01-23 MED ORDER — NICOTINE 21 MG/24HR TD PT24: 21.0000 mg | MEDICATED_PATCH | Freq: Every day | TRANSDERMAL | Status: AC | PRN

## 2024-01-23 MED ORDER — INSULIN ASPART 100 UNIT/ML IJ SOLN
0.0000 [IU] | Freq: Three times a day (TID) | INTRAMUSCULAR | Status: DC
  Administered 2024-01-23: 9 [IU] via SUBCUTANEOUS
  Administered 2024-01-24: 3 [IU] via SUBCUTANEOUS
  Administered 2024-01-24: 5 [IU] via SUBCUTANEOUS
  Administered 2024-01-24 – 2024-01-25 (×2): 7 [IU] via SUBCUTANEOUS
  Administered 2024-01-25: 2 [IU] via SUBCUTANEOUS
  Administered 2024-01-25 – 2024-01-26 (×2): 7 [IU] via SUBCUTANEOUS
  Administered 2024-01-26: 5 [IU] via SUBCUTANEOUS
  Administered 2024-01-26: 7 [IU] via SUBCUTANEOUS
  Administered 2024-01-27: 2 [IU] via SUBCUTANEOUS
  Administered 2024-01-27 (×2): 3 [IU] via SUBCUTANEOUS
  Administered 2024-01-28: 1 [IU] via SUBCUTANEOUS
  Administered 2024-01-30 (×2): 7 [IU] via SUBCUTANEOUS
  Administered 2024-01-30: 5 [IU] via SUBCUTANEOUS
  Administered 2024-01-31: 9 [IU] via SUBCUTANEOUS
  Administered 2024-01-31: 7 [IU] via SUBCUTANEOUS
  Administered 2024-01-31 – 2024-02-01 (×4): 5 [IU] via SUBCUTANEOUS
  Administered 2024-02-02 (×3): 3 [IU] via SUBCUTANEOUS
  Administered 2024-02-03: 5 [IU] via SUBCUTANEOUS
  Administered 2024-02-03: 3 [IU] via SUBCUTANEOUS
  Administered 2024-02-03: 5 [IU] via SUBCUTANEOUS
  Administered 2024-02-04 (×3): 3 [IU] via SUBCUTANEOUS
  Administered 2024-02-05 (×2): 2 [IU] via SUBCUTANEOUS
  Filled 2024-01-23 (×34): qty 1

## 2024-01-23 MED ORDER — POTASSIUM CHLORIDE CRYS ER 20 MEQ PO TBCR
40.0000 meq | EXTENDED_RELEASE_TABLET | Freq: Once | ORAL | Status: AC
  Administered 2024-01-23: 40 meq via ORAL
  Filled 2024-01-23: qty 2

## 2024-01-23 MED ORDER — FENTANYL CITRATE PF 50 MCG/ML IJ SOSY
25.0000 ug | PREFILLED_SYRINGE | INTRAMUSCULAR | Status: DC | PRN
  Administered 2024-01-23 – 2024-01-24 (×2): 25 ug via INTRAVENOUS
  Filled 2024-01-23 (×2): qty 1

## 2024-01-23 MED ORDER — INSULIN ASPART 100 UNIT/ML IJ SOLN
12.0000 [IU] | Freq: Once | INTRAMUSCULAR | Status: AC
  Administered 2024-01-23: 12 [IU] via SUBCUTANEOUS
  Filled 2024-01-23: qty 1

## 2024-01-23 MED ORDER — LACTATED RINGERS IV SOLN: INTRAVENOUS | Status: AC

## 2024-01-23 MED ORDER — MORPHINE SULFATE (PF) 4 MG/ML IV SOLN
4.0000 mg | Freq: Once | INTRAVENOUS | Status: AC
  Administered 2024-01-23: 4 mg via INTRAVENOUS
  Filled 2024-01-23: qty 1

## 2024-01-23 MED ORDER — SODIUM CHLORIDE 0.9 % IV SOLN
2.0000 g | Freq: Two times a day (BID) | INTRAVENOUS | Status: DC
  Administered 2024-01-23 – 2024-01-30 (×14): 2 g via INTRAVENOUS
  Filled 2024-01-23 (×16): qty 12.5

## 2024-01-23 MED ORDER — INSULIN ASPART 100 UNIT/ML IJ SOLN
0.0000 [IU] | Freq: Every day | INTRAMUSCULAR | Status: DC
  Administered 2024-01-23 – 2024-01-26 (×3): 4 [IU] via SUBCUTANEOUS
  Administered 2024-01-29: 2 [IU] via SUBCUTANEOUS
  Administered 2024-01-30 – 2024-02-03 (×5): 3 [IU] via SUBCUTANEOUS
  Administered 2024-02-04: 2 [IU] via SUBCUTANEOUS
  Filled 2024-01-23 (×10): qty 1

## 2024-01-23 MED ORDER — MORPHINE SULFATE (PF) 4 MG/ML IV SOLN
4.0000 mg | INTRAVENOUS | Status: AC | PRN
  Filled 2024-01-23: qty 1

## 2024-01-23 MED ORDER — ACETAMINOPHEN 325 MG PO TABS
650.0000 mg | ORAL_TABLET | Freq: Four times a day (QID) | ORAL | Status: AC | PRN
  Administered 2024-01-28: 650 mg via ORAL
  Filled 2024-01-23: qty 2

## 2024-01-23 MED ORDER — POTASSIUM CHLORIDE CRYS ER 20 MEQ PO TBCR
20.0000 meq | EXTENDED_RELEASE_TABLET | Freq: Once | ORAL | Status: AC
  Administered 2024-01-23: 20 meq via ORAL
  Filled 2024-01-23: qty 1

## 2024-01-23 MED ORDER — ONDANSETRON HCL 4 MG PO TABS: 4.0000 mg | ORAL_TABLET | Freq: Four times a day (QID) | ORAL | Status: AC | PRN

## 2024-01-23 MED ORDER — FENOFIBRATE 54 MG PO TABS
54.0000 mg | ORAL_TABLET | Freq: Every day | ORAL | Status: DC
  Administered 2024-01-24 – 2024-01-28 (×5): 54 mg via ORAL
  Filled 2024-01-23 (×6): qty 1

## 2024-01-23 MED ORDER — VANCOMYCIN VARIABLE DOSE PER UNSTABLE RENAL FUNCTION (PHARMACIST DOSING)
Status: DC
Start: 2024-01-24 — End: 2024-01-27

## 2024-01-23 MED ORDER — HYDROMORPHONE HCL 1 MG/ML IJ SOLN
0.5000 mg | INTRAMUSCULAR | Status: DC | PRN
  Administered 2024-01-23 – 2024-01-24 (×4): 0.5 mg via INTRAVENOUS
  Filled 2024-01-23 (×4): qty 0.5

## 2024-01-23 MED ORDER — VANCOMYCIN HCL IN DEXTROSE 1-5 GM/200ML-% IV SOLN
1000.0000 mg | Freq: Once | INTRAVENOUS | Status: AC
  Administered 2024-01-23: 1000 mg via INTRAVENOUS
  Filled 2024-01-23: qty 200

## 2024-01-23 MED ORDER — TRAZODONE HCL 100 MG PO TABS
100.0000 mg | ORAL_TABLET | Freq: Every day | ORAL | Status: DC
  Administered 2024-01-23 – 2024-02-04 (×13): 100 mg via ORAL
  Filled 2024-01-23 (×3): qty 1
  Filled 2024-01-23: qty 2
  Filled 2024-01-23 (×8): qty 1
  Filled 2024-01-23: qty 2

## 2024-01-23 MED ORDER — ACETAMINOPHEN 650 MG RE SUPP: 650.0000 mg | Freq: Four times a day (QID) | RECTAL | Status: AC | PRN

## 2024-01-23 MED ORDER — LACTATED RINGERS IV BOLUS
1000.0000 mL | Freq: Once | INTRAVENOUS | Status: AC
  Administered 2024-01-23: 1000 mL via INTRAVENOUS

## 2024-01-23 MED ORDER — SENNA 8.6 MG PO TABS
1.0000 | ORAL_TABLET | Freq: Two times a day (BID) | ORAL | Status: DC
  Administered 2024-01-24 – 2024-01-31 (×7): 8.6 mg via ORAL
  Filled 2024-01-23 (×16): qty 1

## 2024-01-23 MED ORDER — SENNOSIDES-DOCUSATE SODIUM 8.6-50 MG PO TABS: 1.0000 | ORAL_TABLET | Freq: Every evening | ORAL | Status: DC | PRN

## 2024-01-23 MED ORDER — GADOBUTROL 1 MMOL/ML IV SOLN
7.0000 mL | Freq: Once | INTRAVENOUS | Status: AC | PRN
  Administered 2024-01-23: 7 mL via INTRAVENOUS

## 2024-01-23 NOTE — Hospital Course (Addendum)
 Hospital course / significant events:  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 03/06 admitted to hospitalist service. Repeat MRI showing interval increase in subcutaneous fat edema and swelling as well as new abscesses in the anterior aspect. continued chronic appearing proximal tibial osteomyelitis.  Vascular surgery consult. Pt on abx. Eventually decided for AKA which was done 03/11. ABLA postop complicated by underlying anemia chronic inflammation / CKD, transfused 1 unit PRBC 03/14.   Consultants:  Vascular Surgery   Procedures/Surgeries: 01/29/24: left AKA - Dr Wyn Quaker      ASSESSMENT & PLAN:    Left tibial osteomyelitis and stump abscess, now s/p AKA done on 3/12 Mepilex dressing w/ coban compression wrap. Cleared by vasc sx to dc to SNF and f/u in 1 wk as an out patient. Lyrica 50 mg p.o. 3 times daily    Severe hypokalemia - resolved Potassium repleted. Monitor BMP    Acute kidney injury on CKD 3a - improved  Monitor BMP    Metabolic acidosis due to renal failure - improved Continue oral bicarbonate. Repeat BMP daily   Hypertension, hydralazine as needed Monitor BP and titrate medications accordingly   Uncontrolled insulin-dependent diabetes mellitus A1c 13.6 suggesting very poor control  Titration basal and sliding scale insulin    Tobacco use Counseled on cessation.  Nicotine patch as needed   Major depressive disorder, recurrent, in remission  Continue trazodone 100 mg nightly   Vitamin D deficiency: Supplementing  follow with PCP to repeat vitamin D level after 3 to 6 months.   Anemia due to Acute blood loss s/p Left AKA 3/14 Hb 7.1, transfused 1 PRBC Monitor H&H    Anemia due to mild iron deficiency and CKD Folic acid and B12 within normal range Transferrin  saturation 20% wnl but iron level is 42 slightly low Started oral iron supplement with vitamin C   No concerns based on BMI: Body mass index is 24.39 kg/m.  Underweight - under 18  overweight - 25 to 29 obese - 30 or more Class 1 obesity: BMI of 30.0 to 34 Class 2 obesity: BMI of 35.0 to 39 Class 3 obesity: BMI of 40.0 to 49 Super Morbid Obesity: BMI 50-59 Super-super Morbid Obesity: BMI 60+ Significantly low or high BMI is associated with higher medical risk.  Weight management advised as adjunct to other disease management and risk reduction treatments    DVT prophylaxis: lovenox IV fluids: no continuous IV fluids  Nutrition: carb modified diet  Central lines / other devices: none  Code Status: FULL CODE ACP documentation reviewed: none on file in [facility]  [facility] needs: SNF rehab placement, ?CIR? Medical barriers to dispo: placement. Medical readiness for discharge was 02/03/24.

## 2024-01-23 NOTE — Consult Note (Signed)
 Hospital Consult    Reason for Consult:  Left BKA Stump Infection.  Requesting Physician:  Dr Phineas Semen MD MRN #:  [id number]  History of Present Illness: This is a 48 y.o. male with history of insulin-dependent diabetes mellitus on insulin pump, GERD, hypertension, who presents emergency department for chief concerns of wound check in the left leg at site of amputation.  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  Patient recently underwent an incision and drainage of the left lower stump on 05/02/2023.  He returns to [facility] emergency department today with concerns of redness pain and swelling that is extending from the stump area up through his knee.  Pictures were taken and placed in the media file.  Patient denies any recent trauma or falls to his extremity.  Patient endorses this started 2 to 3 days ago and has progressively gotten worse and was concerned so he came to the emergency department.  Patient is currently being followed by infectious disease.  He endorses placing antibiotic cream on the area of concern as instructed to do so by infectious disease.  He feels this is not working at this time.  Vascular surgery was consulted to evaluate.  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;  Surgeon: Linus Galas, DPM;  Location: [facility] ORS;  Service: Podiatry;  Laterality: Left;   AMPUTATION Left 11/16/2020   Procedure: AMPUTATION BELOW KNEE;  Surgeon: Annice Needy, MD;  Location: [facility] OR[facility];  Service: Vascular;  Laterality: Left;   ANTERIOR CERVICAL DECOMP/DISCECTOMY FUSION N/A 09/09/2017   Procedure: ANTERIOR CERVICAL DECOMPRESSION/DISCECTOMY FUSION  CERVICAL 6- CERVICAL 7;  Surgeon: Coletta Memos, MD;  Location: [facility];  Service: Neurosurgery;  Laterality: N/A;  ANTERIOR CERVICAL DECOMPRESSION/DISCECTOMY FUSION CERVICAL 6- CERVICAL 7   APPENDECTOMY     I & D EXTREMITY Right 10/03/2017   Procedure: IRRIGATION AND DEBRIDEMENT RIGHT WRIST;  Surgeon: Betha Loa, MD;  Location: [facility] OR;  Service: Orthopedics;  Laterality: Right;   I & D EXTREMITY Right 11/26/2018   Procedure: IRRIGATION AND DEBRIDEMENT FASCIA ON RIGHT FOOT;  Surgeon: Gwyneth Revels, DPM;  Location: [facility] OR[facility];  Service: Podiatry;  Laterality: Right;   INCISION AND DRAINAGE Right 03/06/2019   Procedure: INCISION AND DRAINAGE RIGHT FOOT, WITH 4th RAY AMPUTATION;  Surgeon: Gwyneth Revels, DPM;  Location: [facility];  Service: Podiatry;  Laterality: Right;   INCISION AND DRAINAGE Left 11/07/2020   Procedure: INCISION AND DRAINAGE;  Surgeon: Linus Galas, DPM;  Location: [facility] OR[facility];  Service: Podiatry;  Laterality: Left;   INCISION AND DRAINAGE ABSCESS Left 05/02/2023   Procedure: INCISION AND DRAINAGE ABSCESS LEFT LOWER EXTREMITY;  Surgeon: Annice Needy, MD;  Location: [facility];  Service: Vascular;  Laterality: Left;   IRRIGATION AND DEBRIDEMENT FOOT Left 11/11/2020   Procedure: IRRIGATION AND DEBRIDEMENT FOOT;  Surgeon: Linus Galas, DPM;  Location: [facility];  Service: Podiatry;  Laterality: Left;   METATARSAL HEAD EXCISION Right 05/15/2019   Procedure: OSTECTOMY;MET HEAD 5;  Surgeon: Gwyneth Revels, DPM;  Location: [facility];  Service: Podiatry;  Laterality: Right;   osteomylitis     ROTATOR CUFF REPAIR Left    SHOULDER ARTHROSCOPY WITH DEBRIDEMENT AND BICEP TENDON  REPAIR Right 08/27/2023   Procedure: SHOULDER ARTHROSCOPY WITH INCISION AND DRAINAGE;  Surgeon: Signa Kell, MD;  Location: [facility];  Service: Orthopedics;  Laterality: Right;    Allergies  Allergen Reactions   Oxycodone Itching    Prior to Admission medications   Medication Sig Start Date End Date Taking? Authorizing Provider   benazepril (LOTENSIN) 20 MG tablet Take 1 tablet (20 mg total) by mouth daily. 08/31/23   Jonah Blue, MD  cefadroxil (DURICEF) 500 MG capsule Take 1 capsule (500 mg total) by mouth 2 (two) times daily. 10/02/23   Lynn Ito, MD  chlorhexidine (HIBICLENS) 4 % external liquid Apply topically daily as needed. 09/24/23   Lynn Ito, MD  Continuous Glucose Sensor (DEXCOM G6 SENSOR) MISC SMARTSIG:Topical Every 10 Days 09/02/23   [provider]  Continuous Glucose Transmitter (DEXCOM G6 TRANSMITTER) MISC  08/31/23   [provider]  [employer] SENNA 8.6 MG tablet Take 1 tablet by mouth 2 (two) times daily as needed for constipation. 10/31/17   [provider]  fenofibrate (TRICOR) 48 MG tablet Take 48 mg by mouth daily. 08/31/23   [provider]  FIASP 100 UNIT/ML SOLN Inject into the skin continuous. Via pump 10/09/20   [provider]  Insulin Disposable Pump (OMNIPOD 5 DEXG7G6 PODS GEN 5) MISC SMARTSIG:1 Device SUB-Q Every Other Day 09/03/23   [provider]  iron polysaccharides (NIFEREX) 150 MG capsule Take 1 capsule (150 mg total) by mouth daily. 08/27/23   Marrion Coy, MD  mupirocin ointment (BACTROBAN) 2 % Apply 1 Application topically 2 (two) times daily. 09/24/23   Lynn Ito, MD  ondansetron (ZOFRAN) 4 MG tablet Take 1 tablet (4 mg total) by mouth every 8 (eight) hours as needed for nausea or vomiting. 09/04/23   Lynn Ito, MD  pantoprazole (PROTONIX) 40 MG tablet Take 40 mg by mouth daily. 09/16/17   [provider]  traZODone (DESYREL) 100 MG tablet Take 100 mg by mouth at bedtime. 08/31/23   [provider]    Social History   Socioeconomic History   Marital status: Married    Spouse name: Not on file   Number of children: 4   Years of education: Not on file   Highest education level: Not on file  Occupational History   Not on file  Tobacco Use   Smoking status:  Some Days    Current packs/day: 0.00    Average packs/day: 1 pack/day for 17.0 years (17.0 ttl pk-yrs)    Types: Cigarettes    Start date: 01/14/2002    Last attempt to quit: 01/14/2019    Years since quitting: 5.0   Smokeless tobacco: Never  Vaping Use   Vaping status: Never Used  Substance and Sexual Activity   Alcohol use: Yes    Comment: rare   Drug use: No   Sexual activity: Yes  Other Topics Concern   Not on file  Social History Narrative   Not on file   Social Drivers of Health   Financial Resource Strain: Low Risk  (07/24/2022)   Received from [facility], [facility]   Overall Financial Resource Strain (CARDIA)    Difficulty of Paying Living Expenses: Not hard at all  Food Insecurity: No Food Insecurity (06/01/2023)   Hunger Vital Sign    Worried About Running Out of Food in the Last Year: Never true    Ran Out of Food in the Last Year: Never true  Transportation Needs: No Transportation Needs (06/01/2023)  PRAPARE - Administrator, Civil Service (Medical): No    Lack of Transportation (Non-Medical): No  Physical Activity: Not on file  Stress: No Stress Concern Present (07/24/2022)   Received from [facility], [facility] - Occupational Stress Questionnaire    Feeling of Stress : Not at all  Social Connections: Unknown (03/21/2022)   Received from [facility]   Social Network    Social Network: Not on file  Intimate Partner Violence: Not At Risk (06/01/2023)   Humiliation, Afraid, Rape, and Kick questionnaire    Fear of Current or Ex-Partner: No    Emotionally Abused: No    Physically Abused: No    Sexually Abused: No     Family History  Problem Relation Age of Onset   Diabetes Mother    Heart disease Father    Diabetes Father    Arthritis Other    Hyperlipidemia Other    Hypertension Other    Cancer Other        breast   Mental illness Neg Hx     ROS: Otherwise negative  unless mentioned in HPI  Physical Examination  Vitals:   01/23/24 1429  BP: 136/85  Pulse: 94  Resp: 18  Temp: 98.3 F (36.8 C)  SpO2: 100%   Body mass index is 25.11 kg/m.  General:  WDWN in NAD Gait: Not observed HENT: WNL, normocephalic Pulmonary: normal non-labored breathing, without Rales, rhonchi,  wheezing Cardiac: regular, without  Murmurs, rubs or gallops; without carotid bruits Abdomen: Positive bowel sounds throughout soft, NT/ND, no masses Skin: without rashes Vascular Exam/Pulses: Right lower extremity warm to touch with palpable PT DP pulses.  Left lower extremity with BKA.  Stump site red cellulitic with +1 edema and scab to the anterior portion.  No drainage noted. Extremities: without ischemic changes, without Gangrene , with cellulitis; with open wounds;  Musculoskeletal: no muscle wasting or atrophy  Neurologic: A&O X 3;  No focal weakness or paresthesias are detected; speech is fluent/normal Psychiatric:  The pt has Normal affect. Lymph:  Unremarkable  CBC    Component Value Date/Time   WBC 17.8 (H) 01/23/2024 1429   RBC 4.52 01/23/2024 1429   HGB 12.0 (L) 01/23/2024 1429   HCT 34.7 (L) 01/23/2024 1429   PLT 322 01/23/2024 1429   MCV 76.8 (L) 01/23/2024 1429   MCH 26.5 01/23/2024 1429   MCHC 34.6 01/23/2024 1429   RDW 15.9 (H) 01/23/2024 1429   LYMPHSABS 1.0 01/23/2024 1429   MONOABS 0.7 01/23/2024 1429   EOSABS 0.0 01/23/2024 1429   BASOSABS 0.0 01/23/2024 1429    BMET    Component Value Date/Time   NA 125 (L) 01/23/2024 1429   K 2.4 (LL) 01/23/2024 1429   CL 97 (L) 01/23/2024 1429   CO2 18 (L) 01/23/2024 1429   GLUCOSE 622 (HH) 01/23/2024 1429   BUN 28 (H) 01/23/2024 1429   CREATININE 2.51 (H) 01/23/2024 1429   CALCIUM 8.7 (L) 01/23/2024 1429   GFRNONAA 31 (L) 01/23/2024 1429   GFRAA >60 05/05/2020 1930    COAGS: Lab Results  Component Value Date   INR 1.0 06/01/2023   INR 1.1 09/06/2021   INR 0.9 12/21/2020      Non-Invasive Vascular Imaging:   EXAM:01/23/24 MRI OF LOWER LEFT EXTREMITY WITHOUT AND WITH CONTRAST   TECHNIQUE: Multiplanar, multisequence MR imaging of the left distal femur through proximal tibia was performed both before and after administration  of intravenous contrast.   CONTRAST:  7mL GADAVIST GADOBUTROL 1 MMOL/ML IV SOLN   COMPARISON:  Left tibia and fibula radiographs 01/23/2024, left knee radiographs 08/20/2023, 06/01/2023; MRI left knee without and with contrast 08/20/2023   FINDINGS: Bones/Joint/Cartilage   Previously there was scattered decreased T1 and increased T2 signal throughout the majority of the remaining proximal left tibia status post below-the-knee amputation. There are now more distinct decreased T1 and increased T2 signal "channels" or "tracts "extending proximally from the lateral aspect of the distal tibial amputation stump. These are in a similar course compared to the prior scattered edema and more scattered likely bone abscesses previously seen. These channels measure up to approximately 3 cm in transverse dimension and 1.8 cm in AP dimension and extend up to the proximal aspect of the tibial metaphysis along an approximate 8.3 cm craniocaudal length (coronal series 7, image 15). There is marrow edema also extending more proximally into the tibial spines. The slightly decreased geographic distribution and more defined walls of the signal abnormality suggest evolving more subacute to chronic osteomyelitis.   Again no cortical erosion or signal abnormality to indicate acute osteomyelitis within the distal femur, patella, or proximal fibula.   Ligaments   The medial collateral ligament and fibular collateral ligament appear intact. The ACL and PCL are grossly intact.   Muscles and Tendons   There is moderate edema within the anterior proximal calf muscle compartment, mildly decreased from prior. Mild edema within the posterior tibial  muscle is decreased. Near resolution of the prior medial head of the gastrocnemius muscle edema.   Soft tissues   There is metallic susceptibility artifact within the distal left below-the-knee amputation stump and postsurgical change.   On 08/20/2023, there was a peripherally enhancing fluid collection/sinus tract extending distally from the distal lateral aspect of the tibial stump distally near the skin surface. This now appears slightly smaller, and is less distinct. However there remains significant decreased T1 and increased T2 signal edema, swelling, and enhancement of the distal tibial subcutaneous fat, and it actually appears increased, with the depth of the subcutaneous fat measuring up to approximately 1.2 to 1.8 cm compared to 0.7 to 1.2 cm in similar regions previously. There is lobular decreased T1 and increased T2 signal that may represent inflammatory phlegmon versus a partially walled-off abscess just distal to the distal tibial amputation stump, overall in a region measuring up to approximately 2.8 x 2.5 x 1.1 cm (transverse by AP by craniocaudal, coronal image 12 and sagittal image 14). There is chronic erosion of the distal lateral aspect of the tibial stump, similar to prior.   There is new lobular decreased T1 increased T2 signal pockets of abscesses within the subcutaneous fat anterior to the tibial stump. The greatest focal region of these abscesses is seen bordering the distal anterior tibial stump and measures up to approximately 2.5 x 1.0 x 3.7 cm (transverse by AP by craniocaudal, axial image 25 and sagittal image 17). Another dominant pocket of abscess is seen just anterior to the tibial tubercle and patellar tendon insertion, measuring up to approximately 1.3 x 0.4 x 1.4 cm (transverse by AP by craniocaudal, axial image 14 and sagittal image 17).   No definitive new distal tibial stump cortical erosion is seen.   --   A portion of the right calf is  included on coronal large field-of-view images. No abnormal marrow signal is seen. Mild lateral knee compartment joint space narrowing and peripheral osteophytosis.   IMPRESSION: Compared to  08/20/2023:   1. Interval decreased definition of the previously seen abscess just distal to the lateral aspect of the tibial stump that previously contacted the distal tibial cortex. However, overall there is interval increase in subcutaneous fat edema and swelling bordering the anterior and distal aspects of the distal tibial stump, and there are some new pockets of rim enhancing abscesses within the subcutaneous fat anterior to the remaining proximal tibia. 2. The prior proximal tibial osteomyelitis abnormal signal now appears more well-defined as rim enhancing "channels/tracts" and overall findings appear to represent evolution of the prior acute osteomyelitis into more chronic "channels" of osteomyelitis. While there likely still remains proximal tibial bone infection, this does not appear to have increased in size or extent. No new cortical erosion is seen. 3. Interval decrease in muscle edema and enhancement within the postsurgical left calf.  Statin:  No. Beta Blocker:  No. Aspirin:  No. ACEI:  Yes.   ARB:  No. CCB use:  No Other antiplatelets/anticoagulants:  No.    ASSESSMENT/PLAN: This is a 48 y.o. male who presents to [facility] emergency department with left lower extremity BKA redness and swelling with radiating pain to his knee.  Patient is now status post left lower stump incision and drainage from 05/02/2023 with what appears to be a recurrent cellulitis surrounding a skin ulceration on the anterior part of his stump.  MRI was obtained to assess osteomyelitis.  PLAN Admit to Hospitalist Service Consult ID for antibiotic therapy for cellulitis possible osteomyelitis. MRI of left lower extremity reveals a more well-defined rim-enhancing osteomyelitis with channels and tracks.   This represents evolution of the prior acute osteomyelitis and to more of a chronic osteomyelitis. Vascular surgery recommends a left above-the-knee amputation at this time.  Patient would rather undergo long-term antibiotics for now as he is not ready for an above-the-knee amputation.  He endorses he has had a PICC line and had antibiotics in the past.  He verbalizes that he understands at some point in time he may need an above-the-knee amputation but wishes to have long-term antibiotics prior to agreeing to a left above-the-knee amputation.   -I discussed the case with Dr. Festus Barren MD and he agrees with plan   Marcie Bal [facility] 01/23/2024 4:25 PM

## 2024-01-23 NOTE — Assessment & Plan Note (Signed)
 -  As needed nicotine patch ordered ?

## 2024-01-23 NOTE — Assessment & Plan Note (Signed)
 Corrected serum sodium is 138

## 2024-01-23 NOTE — ED Provider Notes (Signed)
 [facility] Provider Note    Event Date/Time   First MD Initiated Contact with Patient 01/23/24 1507     (approximate)   History   Wound Check   HPI  Richard Mendoza is a 48 y.o. male  who presents to the emergency department today because of concern for infection to his left bka.  Patient started noticing symptoms yesterday. Has had some swelling and pain. Noticed redness that was spreading up from the wound. Says he has history of infections to his bka in the past, and that he was told if it reached his bone he would need a higher amputation. He denies any fevers, chills. Does have history of diabetes and says his sugars have been running high for the past few days.    Physical Exam   Triage Vital Signs: ED Triage Vitals  Encounter Vitals Group     BP 01/23/24 1429 136/85     Systolic BP Percentile --      Diastolic BP Percentile --      Pulse Rate 01/23/24 1429 94     Resp 01/23/24 1429 18     Temp 01/23/24 1429 98.3 F (36.8 C)     Temp Source 01/23/24 1429 Oral     SpO2 01/23/24 1429 100 %     Weight 01/23/24 1425 175 lb (79.4 kg)     Height 01/23/24 1425 5\' 10"  (1.778 m)     Head Circumference --      Peak Flow --      Pain Score 01/23/24 1425 9     Pain Loc --      Pain Education --      Exclude from Growth Chart --     Most recent vital signs: Vitals:   01/23/24 1429  BP: 136/85  Pulse: 94  Resp: 18  Temp: 98.3 F (36.8 C)  SpO2: 100%   General: Awake, alert, oriented. CV:  Good peripheral perfusion. Regular rate and rhythm. Resp:  Normal effort. Lungs clear. Abd:  No distention.  Other:  Left bka with area of erythema and tenderness. No fluctuance. No drainage.    ED Results / Procedures / Treatments   Labs (all labs ordered are listed, but only abnormal results are displayed) Labs Reviewed  COMPREHENSIVE METABOLIC PANEL - Abnormal; Notable for the following components:      Result Value   Sodium 125 (*)    Potassium  2.4 (*)    Chloride 97 (*)    CO2 18 (*)    Glucose, Bld 622 (*)    BUN 28 (*)    Creatinine, Ser 2.51 (*)    Calcium 8.7 (*)    Albumin 3.0 (*)    GFR, Estimated 31 (*)    All other components within normal limits  CBC WITH DIFFERENTIAL/PLATELET - Abnormal; Notable for the following components:   WBC 17.8 (*)    Hemoglobin 12.0 (*)    HCT 34.7 (*)    MCV 76.8 (*)    RDW 15.9 (*)    Neutro Abs 15.9 (*)    Abs Immature Granulocytes 0.12 (*)    All other components within normal limits  LACTIC ACID, PLASMA  LACTIC ACID, PLASMA     EKG None   RADIOLOGY I independently interpreted and visualized the left tib fib. My interpretation: no osseous erosion. Radiology report: Pending at time of admission.  PROCEDURES:  Critical Care performed: No   MEDICATIONS ORDERED IN ED: Medications - No data  to display   IMPRESSION / MDM / ASSESSMENT AND PLAN / ED COURSE  I reviewed the triage vital signs and the nursing notes.                              Differential diagnosis includes, but is not limited to, cellulitis, abscess, osteomyelitis.  Patient's presentation is most consistent with acute presentation with potential threat to life or bodily function.  Patient presented to the emergency department today because of concerns for wound to his left BKA.  On exam there is an area of erythema and tenderness.  No appreciable fluctuance.  I did obtain an x-ray which did not show any obvious osseous erosion to my interpretation.  Did discuss with vascular surgery who recommended MRI to further evaluate for possible osteo-.  Blood work shows significant hyperglycemia, however no anion gap at this time. Discussed with Dr. Sedalia Muta with possible service who will evaluate for admission.      FINAL CLINICAL IMPRESSION(S) / ED DIAGNOSES   Final diagnoses:  Cellulitis, unspecified cellulitis site     Note:  This document was prepared using Dragon voice recognition software and may include  unintentional dictation errors.    Phineas Semen, MD 01/23/24 [phone]

## 2024-01-23 NOTE — Assessment & Plan Note (Addendum)
 EKG check ordered Magnesium checked on admission, pending collection at the time of this dictation Status post potassium chloride 40 mEq p.o. one-time dose Potassium chloride 10 mill equivalent IV, 5 doses ordered on admission and potassium chloride 20 mEq PO once ordered on admission Continue with LR infusion at 125 mL/h, 1 day ordered Discussed with nursing to hang the potassium chloride by IV Timed potassium recheck for 2200 hrs. on day of admission

## 2024-01-23 NOTE — Assessment & Plan Note (Addendum)
     With pain at the stump Blood cultures x 2 are in process Vancomycin and cefepime per pharmacy Vascular has been consulted by EDP MRI tibia fibula of the left with and without contrast has been ordered by EDP and pending at this time LR infusion at 125 mL/h, 1 day ordered If needed, a.m. team to consult infectious disease specialist pending MRI Symptomatic support: Morphine 4 mg IV every 4 hours as needed for moderate pain, 20 hours ordered; fentanyl 25 mcg IV every 4 hours as needed for severe pain, 20 hours ordered Dilaudid 0.5 mg every 4 hours as needed for severe pain not sponsored to IV fentanyl, 20 hours of coverage ordered

## 2024-01-23 NOTE — H&P (Signed)
 History and Physical   Richard Mendoza ZOX:[id number] DOB: [birth year] DOA: 01/23/2024  PCP: Barbette Reichmann, MD  Outpatient Specialists: Dr. Rivka Safer Patient coming from: Home  I have personally briefly reviewed patient's old medical records in [facility] EMR.  Chief Concern: Left leg wound check  HPI: 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  Vitals in the ED showed temperature of 98.3, respiration 18, heart rate 94, blood pressure 136/85, SpO2 100% on room air.  Serum sodium is 125, potassium 2.4, chloride 97, bicarb 18, BUN of 28, serum creatinine of 2.51, EGFR 31, nonfasting blood glucose 622, WBC 17.8, hemoglobin 12.0, platelets of 322.  ED treatment: Potassium chloride 40 mill equivalent p.o. one-time dose, morphine 4 mg IV one-time dose, LR 1 L bolus, vancomycin per pharmacy. ---------------------------------- At bedside, patient is able to tell me his first and last name, age, location, current calendar year.  He reports he noticed that his left stump has been painful and swelling and he is not able to put on his prosthetic due to the swelling.  The left stump is red and swollen and he noticed the symptoms on 01/22/2024.  He denies bleeding or purulent drainage.  He denies fever, chills, nausea, vomiting, dysuria, hematuria, diarrhea, blood in his stool.  He denies syncope and loss of consciousness.  Social history: He is at home.  He is a former tobacco user, quitting in October 2024.  He denies EtOH and recreational drug use.  He is disabled.  ROS: Constitutional: no weight change, no fever ENT/Mouth: no sore throat, no rhinorrhea Eyes: no eye pain, no vision changes Cardiovascular: no chest pain, no dyspnea,  no edema, no palpitations Respiratory: no cough, no sputum, no  wheezing Gastrointestinal: no nausea, no vomiting, no diarrhea, no constipation Genitourinary: no urinary incontinence, no dysuria, no hematuria Musculoskeletal: no arthralgias, no myalgias Skin: + skin lesions, no pruritus, + left stump redness + swelling Neuro: + weakness, no loss of consciousness, no syncope Psych: no anxiety, no depression, + decrease appetite Heme/Lymph: no bruising, no bleeding  ED Course: Discussed with EDP, patient requiring hospitalization for chief concerns of cellulitis and some concern for osteomyelitis.  Assessment/Plan  Principal Problem:   Wound cellulitis Active Problems:   Uncontrolled diabetes mellitus with hyperglycemia (HCC)   Hyperlipidemia associated with type 2 diabetes mellitus (HCC)   History of depression   S/P BKA (below knee amputation), left (HCC)   Major depressive disorder, recurrent, in remission (HCC)   PAD (peripheral artery disease) (HCC)   Hypokalemia   Hyponatremia   Tobacco use   Overweight (BMI 25.0-29.9)   Assessment and Plan:  * Wound cellulitis     With pain at the stump Blood cultures x 2 are in process Vancomycin and cefepime per pharmacy Vascular has been consulted by EDP MRI tibia fibula of the left with and without contrast has been ordered by EDP and pending at this time LR infusion at 125 mL/h, 1 day ordered If needed, a.m. team to consult infectious disease specialist pending MRI Symptomatic support: Morphine 4 mg IV every 4 hours as needed for moderate pain, 20 hours ordered; fentanyl 25 mcg IV every 4 hours as needed for severe pain, 20 hours ordered Dilaudid 0.5 mg every 4 hours as needed for severe pain not sponsored to IV fentanyl, 20 hours of coverage ordered  Tobacco use As needed nicotine patch ordered  Hyponatremia Corrected serum sodium is 138  Hypokalemia Magnesium checked on admission, pending collection at the time of this dictation Status post potassium chloride 40 mEq p.o. one-time  dose Potassium chloride 10 mill equivalent IV, 5 doses ordered on admission and potassium chloride 20 mEq PO once ordered on admission Continue with LR infusion at 125 mL/h, 1 day ordered Discussed with nursing to hang the potassium chloride by IV Timed potassium recheck for 2200 hrs. on day of admission  Major depressive disorder, recurrent, in remission (HCC) Trazodone 100 mg nightly resumed  Uncontrolled diabetes mellitus with hyperglycemia (HCC) Complicated by active cellulitis/wound infection One-time dose of insulin 12 units once ordered Insulin SSI with at bedtime coverage ordered Goal inpatient blood glucose levels 140-180  Chart reviewed.   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   Past Surgical History:  Procedure Laterality Date   AMPUTATION Left 11/07/2020   Procedure: 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;  Surgeon: Linus Galas, DPM;  Location: [facility];  Service: Podiatry;  Laterality: Left;   AMPUTATION Left 11/16/2020   Procedure: AMPUTATION BELOW KNEE;  Surgeon: Annice Needy, MD;  Location: [facility];  Service: Vascular;  Laterality: Left;   ANTERIOR CERVICAL DECOMP/DISCECTOMY FUSION N/A 09/09/2017   Procedure: ANTERIOR CERVICAL DECOMPRESSION/DISCECTOMY FUSION CERVICAL 6- CERVICAL 7;  Surgeon: Coletta Memos, MD;  Location: [facility];  Service: Neurosurgery;  Laterality: N/A;  ANTERIOR CERVICAL DECOMPRESSION/DISCECTOMY FUSION CERVICAL 6- CERVICAL 7   APPENDECTOMY     I & D EXTREMITY  Right 10/03/2017   Procedure: IRRIGATION AND DEBRIDEMENT RIGHT WRIST;  Surgeon: Betha Loa, MD;  Location: [facility];  Service: Orthopedics;  Laterality: Right;   I & D EXTREMITY Right 11/26/2018   Procedure: IRRIGATION AND DEBRIDEMENT FASCIA ON RIGHT FOOT;  Surgeon: Gwyneth Revels, DPM;  Location: [facility];  Service: Podiatry;  Laterality: Right;   INCISION AND DRAINAGE Right 03/06/2019   Procedure: INCISION AND DRAINAGE RIGHT FOOT, WITH 4th RAY AMPUTATION;  Surgeon: Gwyneth Revels, DPM;  Location: [facility];  Service: Podiatry;  Laterality: Right;   INCISION AND DRAINAGE Left 11/07/2020   Procedure: INCISION AND DRAINAGE;  Surgeon: Linus Galas, DPM;  Location: [facility];  Service: Podiatry;  Laterality: Left;   INCISION AND DRAINAGE ABSCESS Left 05/02/2023   Procedure: INCISION AND DRAINAGE ABSCESS LEFT LOWER EXTREMITY;  Surgeon: Annice Needy, MD;  Location: [facility];  Service: Vascular;  Laterality: Left;   IRRIGATION AND DEBRIDEMENT FOOT Left 11/11/2020   Procedure: IRRIGATION AND DEBRIDEMENT FOOT;  Surgeon: Linus Galas, DPM;  Location: [facility];  Service: Podiatry;  Laterality: Left;   METATARSAL HEAD EXCISION Right 05/15/2019   Procedure: OSTECTOMY;MET HEAD 5;  Surgeon: Gwyneth Revels, DPM;  Location: [facility];  Service: Podiatry;  Laterality: Right;   osteomylitis     ROTATOR CUFF REPAIR Left    SHOULDER ARTHROSCOPY WITH DEBRIDEMENT AND BICEP TENDON REPAIR Right 08/27/2023   Procedure: SHOULDER ARTHROSCOPY WITH INCISION AND DRAINAGE;  Surgeon: Signa Kell, MD;  Location: [facility];  Service: Orthopedics;  Laterality: Right;   Social History:  reports that he has been smoking cigarettes. He started smoking about 22 years ago. He has a 17 pack-year smoking history. He has never used smokeless tobacco. He reports current alcohol use. He reports that he does not use drugs.  Allergies  Allergen Reactions   Oxycodone Itching   Family History  Problem Relation Age of Onset   Diabetes Mother     Heart disease Father    Diabetes Father    Arthritis Other    Hyperlipidemia Other    Hypertension Other    Cancer Other        breast   Mental illness Neg Hx    Family history: Family history reviewed and not pertinent.  Prior to Admission medications   Medication Sig Start Date End Date Taking? Authorizing Provider  benazepril (LOTENSIN) 20 MG tablet Take 1 tablet (20 mg total) by mouth daily. 08/31/23   Jonah Blue, MD  cefadroxil (DURICEF) 500 MG capsule Take 1 capsule (500 mg total) by mouth 2 (two) times daily. 10/02/23   Lynn Ito, MD  chlorhexidine (HIBICLENS) 4 % external liquid Apply topically daily as needed. 09/24/23   Lynn Ito, MD  Continuous Glucose Sensor (DEXCOM G6 SENSOR) MISC SMARTSIG:Topical Every 10 Days 09/02/23   [provider]  Continuous Glucose Transmitter (DEXCOM G6 TRANSMITTER) MISC  08/31/23   [provider]  [employer] SENNA 8.6 MG tablet Take 1 tablet by mouth 2 (two) times daily as needed for constipation. 10/31/17   [provider]  fenofibrate (TRICOR) 48 MG tablet Take 48 mg by mouth daily. 08/31/23   [provider]  FIASP 100 UNIT/ML SOLN Inject into the skin continuous. Via pump 10/09/20   [provider]  Insulin Disposable Pump (OMNIPOD 5 DEXG7G6 PODS GEN 5) MISC SMARTSIG:1 Device SUB-Q Every Other Day 09/03/23   [provider]  iron polysaccharides (NIFEREX) 150 MG capsule Take 1 capsule (150 mg total) by mouth daily. 08/27/23   Marrion Coy, MD  mupirocin ointment (BACTROBAN) 2 % Apply 1 Application topically 2 (two) times daily. 09/24/23   Lynn Ito, MD  ondansetron (ZOFRAN) 4 MG tablet Take 1 tablet (4 mg total) by mouth every 8 (eight) hours as needed for nausea or vomiting. 09/04/23   Lynn Ito, MD  pantoprazole (PROTONIX) 40 MG tablet Take 40 mg by mouth daily. 09/16/17   [provider]  traZODone (DESYREL) 100 MG tablet Take 100 mg  by mouth at bedtime. 08/31/23   [provider]   Physical Exam: Vitals:   01/23/24 1425 01/23/24 1429  BP:  136/85  Pulse:  94  Resp:  18  Temp:  98.3 F (36.8 C)  TempSrc:  Oral  SpO2:  100%  Weight: 79.4 kg   Height: 5\' 10"  (1.778 m)    Constitutional: appears older than chronological age, chronically ill Eyes: PERRL, lids and conjunctivae normal ENMT: Mucous membranes are moist. Posterior pharynx clear of any exudate or lesions. Age-appropriate dentition. Hearing appropriate Neck: normal, supple, no masses, no thyromegaly Respiratory: clear to auscultation bilaterally, no wheezing, no crackles. Normal respiratory effort. No accessory muscle use.  Cardiovascular: Regular rate and rhythm, no murmurs / rubs / gallops. No extremity edema. 2+ pedal pulses. No carotid bruits.  Abdomen: no tenderness, no masses palpated, no hepatosplenomegaly. Bowel sounds positive.  Musculoskeletal: no clubbing / cyanosis. Good ROM, no contractures, no atrophy. Normal muscle tone.  BKA of the left lower extremity Skin: Redness and swelling at the left lower extremity stump.  Neurologic: Sensation intact. Strength 5/5 in all 4.  Psychiatric: Normal judgment and insight. Alert and oriented x 3. Normal mood.   EKG: Ordered and pending completion at this time  X-ray on Admission: I personally reviewed and I agree with radiologist reading as below.  DG Tibia/Fibula Left Result Date: 01/23/2024 CLINICAL DATA:  Wound infection.  Prior BKA. EXAM: LEFT TIBIA AND FIBULA - 2 VIEW COMPARISON:  X-ray 08/20/2023. FINDINGS: Again evidence of below-the-knee amputation. The amputation margin of the tibia and fibula are grossly preserved. No erosive changes at the margins. Preserved joint spaces of the knee. Trace joint fluid. Hyperostosis along the patella and tibial tubercle. Preserved joint spaces except for hypertrophic degenerative changes of the proximal tibia fibular joint. Osteopenia.  IMPRESSION: Surgical changes of previous below the knee amputation. No definite erosive changes at this time. If there is further concern of bone or soft tissue infection additional cross-sectional imaging study could be considered for further sensitivity such as bone scan or MRI. Electronically Signed   By: Karen Kays M.D.   On: 01/23/2024 17:40   Labs on Admission: I have personally reviewed following labs  CBC: Recent Labs  Lab 01/23/24 1429  WBC 17.8*  NEUTROABS 15.9*  HGB 12.0*  HCT 34.7*  MCV 76.8*  PLT 322   Basic Metabolic Panel: Recent Labs  Lab 01/23/24 1429  NA 125*  K 2.4*  CL 97*  CO2 18*  GLUCOSE 622*  BUN 28*  CREATININE 2.51*  CALCIUM 8.7*   GFR: Estimated Creatinine Clearance: 37.6 mL/min (A) (by C-G formula based on SCr of 2.51 mg/dL (H)).  Liver Function Tests: Recent Labs  Lab 01/23/24 1429  AST 31  ALT 15  ALKPHOS 110  BILITOT 1.0  PROT 7.7  ALBUMIN 3.0*   CBG: Recent Labs  Lab 01/23/24 1630 01/23/24 1801  GLUCAP 563* 424*   Urine analysis:    Component Value Date/Time   COLORURINE STRAW (A) 04/30/2023 0540   APPEARANCEUR CLEAR (A) 04/30/2023 0540   LABSPEC 1.015 04/30/2023 0540   PHURINE 6.0 04/30/2023 0540   GLUCOSEU 150 (A) 04/30/2023 0540   HGBUR SMALL (A) 04/30/2023 0540   BILIRUBINUR NEGATIVE 04/30/2023 0540   KETONESUR NEGATIVE 04/30/2023 0540   PROTEINUR 100 (A) 04/30/2023 0540   UROBILINOGEN 0.2 07/31/2011 2217   NITRITE NEGATIVE 04/30/2023 0540   LEUKOCYTESUR NEGATIVE 04/30/2023 0540   CRITICAL CARE Performed by: Dr. Sedalia Muta  Total critical care time: 32 minutes  Critical care time was exclusive of separately billable procedures and treating other patients.  Critical care was necessary to treat or prevent imminent or life-threatening deterioration.  Critical care was time spent personally by me on the following activities: development of treatment plan with patient as well as nursing, discussions with consultants,  evaluation of patient's response to treatment, examination of patient, obtaining history from patient or surrogate, ordering and performing treatments and interventions, ordering and review of laboratory studies, ordering and review of radiographic studies, pulse oximetry and re-evaluation of patient's condition.  This document was prepared using Dragon Voice Recognition software and may include unintentional dictation errors.  Dr. Sedalia Muta [facility]  If 7PM-7AM, please contact overnight-coverage provider If 7AM-7PM, please contact day attending provider [email or web address]  01/23/2024, 6:19 PM

## 2024-01-23 NOTE — Assessment & Plan Note (Signed)
-   Trazodone 100 mg nightly resumed

## 2024-01-23 NOTE — Consult Note (Signed)
 Pharmacy Antibiotic Note  Richard Mendoza is a 48 y.o. male admitted on 01/23/2024 with cellulitis.  Pharmacy has been consulted for Vancomycin and cefepime dosing. PMH includes recurrent MRSA infection,DM,and Staph aureus bacteremia.  Noted AKI on admission with Scr 2.51 (BL ~1.4)  Plan: Cefepime 2gm IV q 12 hours Vancomycin 1000mg  given, will add 750mg  IV x 1 dose to complete 1750mg  IV loading dose. Renal dosing appropriate at this time due to AKI. Plan to re-assess renal function on AM before any additional vanco dosing.   Height: 5\' 10"  (177.8 cm) Weight: 79.4 kg (175 lb) IBW/kg (Calculated) : 73  Temp (24hrs), Avg:98.3 F (36.8 C), Min:98.3 F (36.8 C), Max:98.3 F (36.8 C)  Recent Labs  Lab 01/23/24 1428 01/23/24 1429 01/23/24 1549  WBC  --  17.8*  --   CREATININE  --  2.51*  --   LATICACIDVEN 1.1  --  1.0    Estimated Creatinine Clearance: 37.6 mL/min (A) (by C-G formula based on SCr of 2.51 mg/dL (H)).    Allergies  Allergen Reactions   Oxycodone Itching    Antimicrobials this admission: 3/6 Vancomycin >>  3/6 Cefepime >>   Dose adjustments this admission: n/a  Microbiology results:  Thank you for allowing pharmacy to be a part of this patient's care.  Richard Mendoza PharmD, BCPS 01/23/2024 4:38 PM

## 2024-01-23 NOTE — ED Triage Notes (Addendum)
 Pt via POV from home. Pt c/o wound check. Pt noticed a wound on the L leg with pain and swelling. Pt was unable to put on his prostethic due to the swelling. L leg is red and swollen. Pt has a hx of BKA on the L leg. Pt has a hx of DM. Denies fevers. Pt is A&Ox4 and NAD

## 2024-01-23 NOTE — Assessment & Plan Note (Signed)
 Complicated by active cellulitis/wound infection One-time dose of insulin 12 units once ordered Insulin SSI with at bedtime coverage ordered Goal inpatient blood glucose levels 140-180

## 2024-01-24 ENCOUNTER — Telehealth (HOSPITAL_COMMUNITY): Payer: Self-pay | Admitting: Pharmacy Technician

## 2024-01-24 ENCOUNTER — Other Ambulatory Visit (HOSPITAL_COMMUNITY): Payer: Self-pay

## 2024-01-24 DIAGNOSIS — L0390 Cellulitis, unspecified: Secondary | ICD-10-CM | POA: Diagnosis not present

## 2024-01-24 DIAGNOSIS — M86352 Chronic multifocal osteomyelitis, left femur: Secondary | ICD-10-CM

## 2024-01-24 DIAGNOSIS — L03116 Cellulitis of left lower limb: Secondary | ICD-10-CM | POA: Diagnosis not present

## 2024-01-24 LAB — BASIC METABOLIC PANEL
Anion gap: 10 (ref 5–15)
Anion gap: 10 (ref 5–15)
BUN: 25 mg/dL — ABNORMAL HIGH (ref 6–20)
BUN: 23 mg/dL — ABNORMAL HIGH (ref 6–20)
CO2: 17 mmol/L — ABNORMAL LOW (ref 22–32)
CO2: 18 mmol/L — ABNORMAL LOW (ref 22–32)
Calcium: 8.8 mg/dL — ABNORMAL LOW (ref 8.9–10.3)
Calcium: 8.6 mg/dL — ABNORMAL LOW (ref 8.9–10.3)
Chloride: 104 mmol/L (ref 98–111)
Chloride: 106 mmol/L (ref 98–111)
Creatinine, Ser: 2.25 mg/dL — ABNORMAL HIGH (ref 0.61–1.24)
Creatinine, Ser: 2.09 mg/dL — ABNORMAL HIGH (ref 0.61–1.24)
GFR, Estimated: 35 mL/min — ABNORMAL LOW (ref >60)
GFR, Estimated: 39 mL/min — ABNORMAL LOW (ref >60)
Glucose, Bld: 257 mg/dL — ABNORMAL HIGH (ref 70–99)
Glucose, Bld: 260 mg/dL — ABNORMAL HIGH (ref 70–99)
Potassium: 2.7 mmol/L — CL (ref 3.5–5.1)
Potassium: 3.1 mmol/L — ABNORMAL LOW (ref 3.5–5.1)
Sodium: 131 mmol/L — ABNORMAL LOW (ref 135–145)
Sodium: 134 mmol/L — ABNORMAL LOW (ref 135–145)

## 2024-01-24 LAB — CBC
HCT: 31.2 % — ABNORMAL LOW (ref 39.0–52.0)
Hemoglobin: 10.7 g/dL — ABNORMAL LOW (ref 13.0–17.0)
MCH: 26.4 pg (ref 26.0–34.0)
MCHC: 34.3 g/dL (ref 30.0–36.0)
MCV: 77 fL — ABNORMAL LOW (ref 80.0–100.0)
Platelets: 264 10*3/uL (ref 150–400)
RBC: 4.05 MIL/uL — ABNORMAL LOW (ref 4.22–5.81)
RDW: 16.2 % — ABNORMAL HIGH (ref 11.5–15.5)
WBC: 12.6 10*3/uL — ABNORMAL HIGH (ref 4.0–10.5)
nRBC: 0 % (ref 0.0–0.2)

## 2024-01-24 LAB — BLOOD GAS, VENOUS
Acid-base deficit: 9 mmol/L — ABNORMAL HIGH (ref 0.0–2.0)
Bicarbonate: 17.5 mmol/L — ABNORMAL LOW (ref 20.0–28.0)
O2 Saturation: 92 %
Patient temperature: 37
pCO2, Ven: 39 mmHg — ABNORMAL LOW (ref 44–60)
pH, Ven: 7.26 (ref 7.25–7.43)
pO2, Ven: 60 mmHg — ABNORMAL HIGH (ref 32–45)

## 2024-01-24 LAB — HEMOGLOBIN A1C
Hgb A1c MFr Bld: 13.6 % — ABNORMAL HIGH (ref 4.8–5.6)
Mean Plasma Glucose: 343.62 mg/dL

## 2024-01-24 LAB — SEDIMENTATION RATE: Sed Rate: 81 mm/h — ABNORMAL HIGH (ref 0–15)

## 2024-01-24 LAB — VANCOMYCIN, RANDOM: Vancomycin Rm: 12 ug/mL

## 2024-01-24 LAB — MAGNESIUM
Magnesium: 2.3 mg/dL (ref 1.7–2.4)
Magnesium: 2.3 mg/dL (ref 1.7–2.4)

## 2024-01-24 LAB — CBG MONITORING, ED
Glucose-Capillary: 257 mg/dL — ABNORMAL HIGH (ref 70–99)
Glucose-Capillary: 339 mg/dL — ABNORMAL HIGH (ref 70–99)
Glucose-Capillary: 231 mg/dL — ABNORMAL HIGH (ref 70–99)

## 2024-01-24 LAB — POTASSIUM: Potassium: 2.9 mmol/L — ABNORMAL LOW (ref 3.5–5.1)

## 2024-01-24 LAB — GLUCOSE, CAPILLARY: Glucose-Capillary: 191 mg/dL — ABNORMAL HIGH (ref 70–99)

## 2024-01-24 LAB — PHOSPHORUS: Phosphorus: 3.2 mg/dL (ref 2.5–4.6)

## 2024-01-24 MED ORDER — INSULIN GLARGINE 100 UNIT/ML ~~LOC~~ SOLN
25.0000 [IU] | Freq: Every day | SUBCUTANEOUS | Status: DC
  Administered 2024-01-24 – 2024-01-25 (×2): 25 [IU] via SUBCUTANEOUS
  Filled 2024-01-24 (×2): qty 0.25

## 2024-01-24 MED ORDER — METRONIDAZOLE 500 MG/100ML IV SOLN
500.0000 mg | Freq: Two times a day (BID) | INTRAVENOUS | Status: DC
  Administered 2024-01-24 – 2024-01-29 (×12): 500 mg via INTRAVENOUS
  Filled 2024-01-24 (×15): qty 100

## 2024-01-24 MED ORDER — POTASSIUM CHLORIDE CRYS ER 20 MEQ PO TBCR
40.0000 meq | EXTENDED_RELEASE_TABLET | Freq: Once | ORAL | Status: AC
  Administered 2024-01-24: 40 meq via ORAL
  Filled 2024-01-24: qty 2

## 2024-01-24 MED ORDER — POTASSIUM CHLORIDE CRYS ER 20 MEQ PO TBCR
40.0000 meq | EXTENDED_RELEASE_TABLET | ORAL | Status: AC
Start: 2024-01-24 — End: 2024-01-24
  Administered 2024-01-24 (×2): 40 meq via ORAL
  Filled 2024-01-24 (×2): qty 2

## 2024-01-24 MED ORDER — HYDROMORPHONE HCL 1 MG/ML IJ SOLN
1.0000 mg | Freq: Once | INTRAMUSCULAR | Status: AC
  Administered 2024-01-24: 1 mg via INTRAVENOUS
  Filled 2024-01-24: qty 1

## 2024-01-24 MED ORDER — OXYCODONE HCL 5 MG PO TABS: 5.0000 mg | ORAL_TABLET | ORAL | Status: DC | PRN

## 2024-01-24 MED ORDER — HYDROMORPHONE HCL 1 MG/ML IJ SOLN
0.5000 mg | Freq: Once | INTRAMUSCULAR | Status: AC
  Administered 2024-01-24: 0.5 mg via INTRAVENOUS
  Filled 2024-01-24: qty 0.5

## 2024-01-24 MED ORDER — MORPHINE SULFATE (PF) 2 MG/ML IV SOLN
2.0000 mg | INTRAVENOUS | Status: DC | PRN
  Administered 2024-01-25 – 2024-01-26 (×5): 2 mg via INTRAVENOUS
  Filled 2024-01-24 (×5): qty 1

## 2024-01-24 MED ORDER — LACTATED RINGERS IV BOLUS
1000.0000 mL | Freq: Once | INTRAVENOUS | Status: AC
  Administered 2024-01-24: 1000 mL via INTRAVENOUS

## 2024-01-24 MED ORDER — POTASSIUM CHLORIDE 10 MEQ/100ML IV SOLN: 10.0000 meq | INTRAVENOUS | Status: AC

## 2024-01-24 MED ORDER — INSULIN GLARGINE-YFGN 100 UNIT/ML ~~LOC~~ SOPN: 25.0000 [IU] | PEN_INJECTOR | SUBCUTANEOUS | Status: DC

## 2024-01-24 MED ORDER — POTASSIUM CHLORIDE 10 MEQ/100ML IV SOLN
10.0000 meq | INTRAVENOUS | Status: AC
  Administered 2024-01-24 (×4): 10 meq via INTRAVENOUS
  Filled 2024-01-24: qty 100

## 2024-01-24 MED ORDER — VANCOMYCIN HCL IN DEXTROSE 1-5 GM/200ML-% IV SOLN
1000.0000 mg | INTRAVENOUS | Status: DC
  Administered 2024-01-24 – 2024-01-27 (×4): 1000 mg via INTRAVENOUS
  Filled 2024-01-24 (×5): qty 200

## 2024-01-24 MED ORDER — KETOROLAC TROMETHAMINE 15 MG/ML IJ SOLN
15.0000 mg | Freq: Four times a day (QID) | INTRAMUSCULAR | Status: DC
  Administered 2024-01-24 – 2024-01-28 (×13): 15 mg via INTRAVENOUS
  Filled 2024-01-24 (×13): qty 1

## 2024-01-24 MED ORDER — HYDROMORPHONE HCL 2 MG PO TABS
4.0000 mg | ORAL_TABLET | Freq: Once | ORAL | Status: AC
  Administered 2024-01-24: 4 mg via ORAL
  Filled 2024-01-24: qty 2

## 2024-01-24 NOTE — Progress Notes (Signed)
  Progress Note   Patient: Richard Mendoza ZOX:[id number] DOB: 12-Mar-1976 DOA: 01/23/2024     1 DOS: the patient was seen and examined on 01/24/2024   Brief hospital course: 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  Assessment and Plan:  LLE stump infection - Presentation with worsening erythema and edema and ill fitting prosthesis.  Patient reports.  Multiple previous hospitalizations for stump infection, most recent in October 2020 for, treated conservatively with antibiotics.  Previous recommendations by vascular surgery patient made require conversion from BKA to AKA.  Patient however is resistant against this recommendation.  When compared against last presentation, repeat Pete MRI showing interval increase in subcutaneous fat edema and swelling as well as new abscesses in the anterior aspect.  I also noting continued chronic appearing proximal tibial osteomyelitis.  Vascular surgery consulted by ED physician.  Placed on empiric cefepime, Flagyl, vancomycin for the time being.  Monitor edema and erythema.  Will recheck CBC in AM.  Severe hypokalemia - Potassium 2.4 on presentation.  Similar to previous hospitalizations.  IV and p.o. supplementation on board.  Will recheck BMP and magnesium later this afternoon and tomorrow morning.  Acute kidney injury on CKD 3a - Previous creatinine baseline 1.4, now up to 2.5 on presentation.  Mild acidemia noted as well.  Likely prerenal etiology exacerbation from uncontrolled diabetes.  IV fluid hydration on board.  Will monitor urine output and recheck BMP in AM.  Uncontrolled insulin-dependent diabetes mellitus - Exacerbated by infection.  Glucose greater than 600 on presentation.  Responded well to insulin coverage.  Pending A1c.  Continues on insulin sliding  scale.  Tobacco use -Counseled on cessation.  Nicotine patch as needed  Major depressive disorder, recurrent, in remission (HCC) -Continue trazodone 100 mg nightly          Subjective: Patient sitting up in bed, comfortable.  Denies any fever, chills, chest pain, nausea, vomiting, abdominal pain.  Admitted that his prosthetic does not fit well given his increased swelling in his left stump.  Physical Exam: Vitals:   01/24/24 0000 01/24/24 0230 01/24/24 0430 01/24/24 0727  BP: 130/78 115/72 124/71 (!) 147/88  Pulse: 67 72 77 77  Resp: 18 18 16 15   Temp:    98.7 F (37.1 C)  TempSrc:    Oral  SpO2: 100% 96% 90% 100%  Weight:      Height:       GENERAL:  Alert, pleasant, no acute distress  HEENT:  EOMI CARDIOVASCULAR:  RRR, no murmurs appreciated RESPIRATORY:  Clear to auscultation, no wheezing, rales, or rhonchi GASTROINTESTINAL:  Soft, nontender, nondistended EXTREMITIES: Left BKA NEURO:  No new focal deficits appreciated SKIN: Left BKA stump erythema and edema with tenderness and mild fluctuance.  See pictures on H&P.   PSYCH:  Appropriate mood and affect   Data Reviewed:  There are no new results to review at this time.  Family Communication: None at bedside  Disposition: Status is: Inpatient Remains inpatient appropriate because: Stump infection requiring IV antibiotics  Planned Discharge Destination: Home with Home Health    Time spent: 36 minutes  Author: Deanna Artis, DO 01/24/2024 12:59 PM  For on call review [email or web address].

## 2024-01-24 NOTE — Inpatient Diabetes Management (Addendum)
 Inpatient Diabetes Program Recommendations  AACE/ADA: New Consensus Statement on Inpatient Glycemic Control (2015)  Target Ranges:  Prepandial:   less than 140 mg/dL      Peak postprandial:   less than 180 mg/dL (1-2 hours)      Critically ill patients:  140 - 180 mg/dL   Lab Results  Component Value Date   GLUCAP 339 (H) 01/24/2024   HGBA1C 13.2 (H) 08/20/2023    Review of Glycemic Control  Latest Reference Range & Units 01/23/24 16:30 01/23/24 18:01 01/23/24 19:20 01/23/24 21:07 01/24/24 08:39 01/24/24 11:29  Glucose-Capillary 70 - 99 mg/dL 161 (HH) 096 (H) 045 (H) 332 (H) 257 (H) 339 (H)  (HH): Data is critically high (H): Data is abnormally high  Diabetes history: DM2  Outpatient Diabetes medications:  Medtronic insulin pump with Dexcom G6 fiasp, g6 Basal insulin  12A      3.6 units/hour Total daily basal insulin: 86.4 units/24 hours  Carb Coverage 1:4       1 unit for every 4 grams of carbohydrates  Insulin Sensitivity 1:20     1 unit drops blood glucose 20 mg/dl  Target Glucose Goals 40J 100-110 mg/dl  Current orders for Inpatient glycemic control:  Novolog 0-9units TID and 0-5 units QHS  Inpatient Diabetes Program Recommendations:    Semglee 25 units every day Novolog 0-15 units TID and 0-5 units at bedtime Please obtain a current A1C  Last A1C was 13.5% on October 2024.  Met with patient at bedside.  He presents to the ED with right BKA stump infection.  He uses the Medtronic insulin pump with Fiasp and the Dexcom G6.  He was hospitalized last October and missed his endocrinology appointment.  Has not been able to reschedule nor get refills on insulin.  This DM RN called Meagan Younts office in [facility] to make an appointment.  Meagan Younts is no longer with the practice.  Their system is currently down and they will call him with a post hospital appointment.  Notified patient; told him to call the office if they do not call him in the next couple of days.   Also made a new patient appointment with Ronny Bacon with Dr. Isidoro Donning (endocrinology) office for June 4th.    Patient has Omnipod's which Meagan Younts prescribed back in October; he needs to see endocrinology for teaching and settings.  He would like to switch to the [facility] G7.  I will provide him with a sample.    He states his BG started increasing last week with his infection and his pump has not been able to keep up with his glucose trends.  I am recommending he discharge on basal/bolus until he follows up with endocrinology.    Asked [facility] pharmacy for benefit check on insulins.  He tells me Candie Mile is on a Sport and exercise psychologist.    Will continue to follow while inpatient.  Thank you, Dulce Sellar, MSN, CDCES Diabetes Coordinator Inpatient Diabetes Program [phone] (team pager from 8a-5p)

## 2024-01-24 NOTE — Progress Notes (Signed)
       CROSS COVER NOTE  NAME: Richard Mendoza MRN: [id number] DOB : December 15, 1975 ATTENDING PHYSICIAN: Cox, Amy Dorris Carnes, DO    Date of Service   01/24/2024   HPI/Events of Note   Nurse reports critical K level 2.7. Previous was 2.9 with Mag level 2.3 at 2316 on 01/23/2024 not reported to me Patient admitted with cellulitis of left BKA and hyperglycemia   Interventions   Assessment/Plan: BMP   Latest Reference Range & Units 01/24/24 04:48  Sodium 135 - 145 mmol/L 131 (L)  Potassium 3.5 - 5.1 mmol/L 2.7 (LL)  Chloride 98 - 111 mmol/L 104  CO2 22 - 32 mmol/L 17 (L)  Glucose 70 - 99 mg/dL 387 (H)  BUN 6 - 20 mg/dL 25 (H)  Creatinine 5.64 - 1.24 mg/dL 3.32 (H)  Calcium 8.9 - 10.3 mg/dL 8.8 (L)  Anion gap 5 - 15  10  (LL): Data is critically low (L): Data is abnormally low (H): Data is abnormally high   Stat VBG Mag and pos added to blood in lab K replacement initiated with 40 MeQ oral and 40 IV      Donnie Mesa NP [facility] Cross Cover 7pm-7am - check amion for availability Pager [phone]

## 2024-01-24 NOTE — Consult Note (Signed)
 Pharmacy Antibiotic Note  Richard Mendoza is a 48 y.o. male admitted on 01/23/2024 with cellulitis.  Pharmacy has been consulted for Vancomycin and cefepime dosing. PMH includes recurrent MRSA infection,DM,and Staph aureus bacteremia.  Noted AKI on admission with Scr 2.51 (BL ~1.4)  Plan: Cefepime 2gm IV q 12 hours Vancomycin 1000 mg IV Q 24 hrs. Goal AUC 400-550. Expected AUC: 418 Expected Cmin: 10.5 SCr used: 2.09, Vd used: 10.5   Height: 5\' 10"  (177.8 cm) Weight: 79.4 kg (175 lb) IBW/kg (Calculated) : 73  Temp (24hrs), Avg:98.4 F (36.9 C), Min:98.1 F (36.7 C), Max:98.7 F (37.1 C)  Recent Labs  Lab 01/23/24 1428 01/23/24 1429 01/23/24 1549 01/24/24 0448 01/24/24 1759  WBC  --  17.8*  --  12.6*  --   CREATININE  --  2.51*  --  2.25* 2.09*  LATICACIDVEN 1.1  --  1.0  --   --   VANCORANDOM  --   --   --   --  12    Estimated Creatinine Clearance: 45.1 mL/min (A) (by C-G formula based on SCr of 2.09 mg/dL (H)).    Allergies  Allergen Reactions   Oxycodone Itching    Antimicrobials this admission: 3/6 Vancomycin >>  3/6 Cefepime >>   Dose adjustments this admission: n/a  Microbiology results: 3/6 bcx: NGTD  Thank you for allowing pharmacy to be a part of this patient's care.  Bettey Costa, PharmD Clinical Pharmacist 01/24/2024 7:02 PM

## 2024-01-24 NOTE — TOC Initial Note (Signed)
 Transition of Care [facility]) - Initial/Assessment Note    Patient Details  Name: Richard Mendoza MRN: [id number] Date of Birth: Aug 21, 1976  [facility]) CM/SW Contact:    Richard Broach, LCSW Phone Number: 01/24/2024, 12:42 PM  Clinical Narrative:                  CM met with patient to complete initial assessment.  Demographic and insurance information verified.  Patient reports that his PCP is Richard Reichmann, MD.  He reports no concerns about getting his medications.  [facility] [address]  He lives in the home with his wife, Richard Mendoza, and kids.  He has the following DME:  WC, BSC, shower chair, cane and walker.  At d/c, patient will be transported home by his wife.  CM to continue to follow of any discharge needs that may arise.        Patient Goals and CMS Choice            Expected Discharge Plan and Services                                              Prior Living Arrangements/Services                       Activities of Daily Living      Permission Sought/Granted                  Emotional Assessment              Admission diagnosis:  Wound cellulitis [L03.90] Patient Active Problem List   Diagnosis Date Noted   Wound cellulitis 01/23/2024   Staphylococcal arthritis of right shoulder (HCC) 08/29/2023   Bacteremia due to Staphylococcus aureus 08/29/2023   Acute bursitis of right shoulder 08/24/2023   Iron deficiency anemia, unspecified 08/23/2023   Overweight (BMI 25.0-29.9) 08/22/2023   MRSA bacteremia 08/22/2023   Amputation stump infection (HCC) 08/20/2023   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) 06/01/2023   Chronic kidney disease, stage 3a (HCC) 06/01/2023   Tobacco abuse 06/01/2023   Cellulitis and abscess of left leg 04/30/2023   Cellulitis of left lower extremity 04/29/2023   Tobacco use 04/29/2023   Carpal tunnel syndrome 07/12/2022   Hyperglycemia due to type 2  diabetes mellitus (HCC) 07/12/2022   Polyneuropathy 07/12/2022   Ulnar neuropathy 07/12/2022   Acute epigastric pain 07/12/2022   Gastro-esophageal reflux disease without esophagitis 07/12/2022   Nausea and vomiting 07/12/2022   Abscess of right upper extremity 02/26/2022   Cellulitis of left lower extremity without foot    Sepsis (HCC) 09/08/2021   Hypokalemia 09/06/2021   Hyponatremia 09/06/2021   PAD (peripheral artery disease) (HCC) 05/26/2021   Acute kidney injury superimposed on chronic kidney disease (HCC) 04/03/2021   Syncope 04/03/2021   Neck pain    Abnormal weight loss 12/09/2020   Change in bowel habit 12/09/2020   Constipation 12/09/2020   Epigastric pain 12/09/2020   Drug-induced constipation 12/09/2020   Periumbilical pain 12/09/2020   Fatty liver 12/09/2020   S/P BKA (below knee amputation), left (HCC) 11/20/2020   Necrotizing fasciitis of ankle and foot (HCC) 11/07/2020   History of 2019 novel coronavirus disease (COVID-19) 09/01/2020   Major depressive disorder, recurrent, in remission (HCC) 09/01/2020   Pain management contract  signed 09/07/2019   Evaluation by psychiatric service required 08/18/2019   History of depression 08/18/2019   Primary osteoarthritis of both shoulders 07/16/2019   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) 07/16/2019   Chronic pain of both shoulders 07/16/2019   Cervical radicular pain 07/16/2019   S/P cervical spinal fusion 07/16/2019   Cervical spondylosis 07/16/2019   Cervical facet joint syndrome 07/16/2019   Chronic pain syndrome 07/16/2019   Anemia of chronic disease 05/25/2019   Osteomyelitis (HCC) 05/25/2019   Diabetic foot infection (HCC) 03/05/2019   Abscess 11/25/2018   Diabetic peripheral neuropathy associated with type 2 diabetes mellitus (HCC) 06/16/2018   Hyperlipidemia associated with type 2 diabetes mellitus (HCC) 06/16/2018   Intractable nausea and vomiting 10/21/2017   HNP (herniated nucleus pulposus),  cervical 09/09/2017   Displacement of cervical intervertebral disc without myelopathy 08/27/2017   Erectile dysfunction 07/31/2017   Gastroesophageal reflux disease 07/31/2017   Rotator cuff syndrome of left shoulder 07/31/2017   Cellulitis of right leg 07/30/2016   Tobacco abuse disorder 07/30/2016   Routine general medical examination at a health care facility 01/20/2014   Diabetic neuropathy, type I diabetes mellitus (HCC) 01/20/2014   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   HLD (hyperlipidemia) 03/17/2009   DM type 2 with diabetic peripheral neuropathy (HCC) 03/02/2009   Uncontrolled diabetes mellitus with hyperglycemia (HCC) 12/08/2008   OBSTRUCTIVE SLEEP APNEA 12/08/2008   Essential hypertension 12/08/2008   Allergic rhinitis 12/15/2007   Other malaise and fatigue 12/15/2007   PCP:  Richard Reichmann, MD Pharmacy:   [id number] [place] - [address] [place] [address] Phone: [phone] Fax: [phone]  [id number] [place], [place] - [address] AT [facility] [address] [place] [address] Phone: [phone] Fax: [phone]  [id number] [place], [place] - [address] AT [facility] [address] [place] [address] Phone: [phone] Fax: [phone]  [facility] [address] [place] [address] Phone: [phone] Fax: [phone]     Social Drivers of Health (SDOH) Social History: SDOH Screenings   Food Insecurity: No Food Insecurity (06/01/2023)  Housing: High Risk (06/01/2023)  Transportation Needs: No Transportation Needs (06/01/2023)  Utilities: Not At Risk (06/01/2023)  Depression (PHQ2-9): Low Risk  (09/24/2023)  Financial Resource Strain: Low Risk  (07/24/2022)   Received from [facility], [facility]  Social Connections: Unknown (03/21/2022)   Received  from [facility], [facility]  Stress: No Stress Concern Present (07/24/2022)   Received from [facility], [facility]  Tobacco Use: High Risk (01/23/2024)   SDOH Interventions:     Readmission Risk Interventions    01/24/2024   12:35 PM 08/21/2023    9:03 AM  Readmission Risk Prevention Plan  Transportation Screening Complete Complete  PCP or Specialist Appt within 3-5 Days Complete Complete  HRI or Home Care Consult Complete   Social Work Consult for Recovery Care Planning/Counseling Complete Complete  Palliative Care Screening Not Applicable Not Applicable  Medication Review Oceanographer) Complete Complete

## 2024-01-24 NOTE — Telephone Encounter (Signed)
 Patient Product/process development scientist completed.    The patient is insured through [facility] MEDICAID.     Ran test claim for Lantus Pen and the current 30 day co-pay is $4.00.  Ran test claim for Novolog FLexPen and the current 30 day co-pay is $4.00.  Ran test claim for Dexcom G7 Sensor and Requires Prior Authorization   This test claim was processed through [employer]- copay amounts may vary at other pharmacies due to [employer], or as the patient moves through the different stages of their insurance plan.     Roland Earl, CPHT Pharmacy Technician III Certified Patient Advocate [facility] Pharmacy Patient Advocate Team Direct Number: [phone]  Fax: [phone]

## 2024-01-25 DIAGNOSIS — L03116 Cellulitis of left lower limb: Secondary | ICD-10-CM | POA: Diagnosis not present

## 2024-01-25 DIAGNOSIS — L0390 Cellulitis, unspecified: Secondary | ICD-10-CM | POA: Diagnosis not present

## 2024-01-25 LAB — CBC
HCT: 25.8 % — ABNORMAL LOW (ref 39.0–52.0)
Hemoglobin: 8.9 g/dL — ABNORMAL LOW (ref 13.0–17.0)
MCH: 26.6 pg (ref 26.0–34.0)
MCHC: 34.5 g/dL (ref 30.0–36.0)
MCV: 77.2 fL — ABNORMAL LOW (ref 80.0–100.0)
Platelets: 244 10*3/uL (ref 150–400)
RBC: 3.34 MIL/uL — ABNORMAL LOW (ref 4.22–5.81)
RDW: 16.3 % — ABNORMAL HIGH (ref 11.5–15.5)
WBC: 10 10*3/uL (ref 4.0–10.5)
nRBC: 0 % (ref 0.0–0.2)

## 2024-01-25 LAB — COMPREHENSIVE METABOLIC PANEL
ALT: 12 U/L (ref 0–44)
AST: 17 U/L (ref 15–41)
Albumin: 2.2 g/dL — ABNORMAL LOW (ref 3.5–5.0)
Alkaline Phosphatase: 79 U/L (ref 38–126)
Anion gap: 9 (ref 5–15)
BUN: 24 mg/dL — ABNORMAL HIGH (ref 6–20)
CO2: 17 mmol/L — ABNORMAL LOW (ref 22–32)
Calcium: 8.4 mg/dL — ABNORMAL LOW (ref 8.9–10.3)
Chloride: 109 mmol/L (ref 98–111)
Creatinine, Ser: 2.09 mg/dL — ABNORMAL HIGH (ref 0.61–1.24)
GFR, Estimated: 39 mL/min — ABNORMAL LOW (ref >60)
Glucose, Bld: 190 mg/dL — ABNORMAL HIGH (ref 70–99)
Potassium: 3.2 mmol/L — ABNORMAL LOW (ref 3.5–5.1)
Sodium: 135 mmol/L (ref 135–145)
Total Bilirubin: 1 mg/dL (ref 0.0–1.2)
Total Protein: 6 g/dL — ABNORMAL LOW (ref 6.5–8.1)

## 2024-01-25 LAB — PROTIME-INR
INR: 1.1 (ref 0.8–1.2)
Prothrombin Time: 14.4 s (ref 11.4–15.2)

## 2024-01-25 LAB — GLUCOSE, CAPILLARY
Glucose-Capillary: 190 mg/dL — ABNORMAL HIGH (ref 70–99)
Glucose-Capillary: 308 mg/dL — ABNORMAL HIGH (ref 70–99)
Glucose-Capillary: 344 mg/dL — ABNORMAL HIGH (ref 70–99)
Glucose-Capillary: 306 mg/dL — ABNORMAL HIGH (ref 70–99)

## 2024-01-25 LAB — MAGNESIUM: Magnesium: 2.1 mg/dL (ref 1.7–2.4)

## 2024-01-25 MED ORDER — INSULIN GLARGINE 100 UNIT/ML ~~LOC~~ SOLN
30.0000 [IU] | Freq: Every day | SUBCUTANEOUS | Status: DC
  Filled 2024-01-25: qty 0.3

## 2024-01-25 MED ORDER — POTASSIUM CHLORIDE 10 MEQ/100ML IV SOLN
10.0000 meq | INTRAVENOUS | Status: AC
  Administered 2024-01-25 (×2): 10 meq via INTRAVENOUS
  Filled 2024-01-25 (×2): qty 100

## 2024-01-25 MED ORDER — POTASSIUM CHLORIDE 20 MEQ PO PACK
40.0000 meq | PACK | Freq: Two times a day (BID) | ORAL | Status: AC
  Administered 2024-01-25 (×2): 40 meq via ORAL
  Filled 2024-01-25 (×2): qty 2

## 2024-01-25 MED ORDER — SODIUM BICARBONATE 650 MG PO TABS
650.0000 mg | ORAL_TABLET | Freq: Three times a day (TID) | ORAL | Status: DC
  Administered 2024-01-25 – 2024-02-02 (×26): 650 mg via ORAL
  Filled 2024-01-25 (×27): qty 1

## 2024-01-25 NOTE — Plan of Care (Signed)

## 2024-01-25 NOTE — Progress Notes (Signed)
 Progress Note   Patient: Richard Mendoza WUJ:[id number] DOB: 1976/01/07 DOA: 01/23/2024     2 DOS: the patient was seen and examined on 01/25/2024   Brief hospital course: 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  Assessment and Plan:  LLE stump infection - Presentation with worsening erythema and edema and ill fitting prosthesis.  Patient reports.  Multiple previous hospitalizations for stump infection, most recent in October 2020 for, treated conservatively with antibiotics.  Previous recommendations by vascular surgery patient made require conversion from BKA to AKA.  Patient however is resistant against this recommendation.  When compared against last presentation, repeat MRI showing interval increase in subcutaneous fat edema and swelling as well as new abscesses in the anterior aspect.  I also noting continued chronic appearing proximal tibial osteomyelitis.  Vascular surgery consulted and following closely.  Continues on empiric cefepime, Flagyl, vancomycin for the time being.  Monitor edema and erythema.  Will recheck CBC in AM.  Likely that patient will need to pursue AKA.  Severe hypokalemia - Potassium 2.4 on presentation.  Similar to previous hospitalizations.  IV and p.o. supplementation on board.  Showing improvement, up to 3.2 this morning.  Will recheck BMP and magnesium tomorrow morning.  Acute kidney injury on CKD 3a - Previous creatinine baseline 1.4, now up to 2.5 on presentation.  Likely prerenal etiology exacerbation from uncontrolled diabetes.  IV fluid hydration on board.  Showing slow improvement.  Will monitor urine output and recheck BMP in AM.  Mixed acid-base disorder - BMP remarkable for persistent mild acidemia.  Likely in setting of CKD will initiate p.o. sodium bicarb 650 mg  3 times daily.  Will recheck BMP in AM.  Uncontrolled insulin-dependent diabetes mellitus - Exacerbated by infection.  Glucose greater than 600 on presentation.  Responded well to insulin coverage.  A1c 13.6 suggesting very poor control of her complaints prior to admission.  Still not well-controlled while inpatient.  Will increase insulin glargine from 25 units daily to 30 units daily.  Continue insulin sliding scale.  Tobacco use -Counseled on cessation.  Nicotine patch as needed  Major depressive disorder, recurrent, in remission (HCC) -Continue trazodone 100 mg nightly          Subjective: Patient sitting up in bed, comfortable.  Denies any fever, chills, chest pain, nausea, vomiting, abdominal pain.  Concerned about his pain regiment, intermittently getting sharp stabbing and throbbing pain in his left stump.  Does admit that it appears less red and less painful than yesterday.  Physical Exam: Vitals:   01/24/24 2015 01/24/24 2335 01/25/24 0533 01/25/24 0830  BP: (!) 167/90 130/74 (!) 149/90 (!) 144/83  Pulse: 81 72 76 71  Resp: 19 19 20 16   Temp: 98.3 F (36.8 C) 98.5 F (36.9 C) 97.8 F (36.6 C) 98.2 F (36.8 C)  TempSrc: Oral Oral Oral   SpO2: 99% 98% 98% 98%  Weight:      Height:       GENERAL:  Alert, pleasant, no acute distress  HEENT:  EOMI CARDIOVASCULAR:  RRR, no murmurs appreciated RESPIRATORY:  Clear to auscultation, no wheezing, rales, or rhonchi GASTROINTESTINAL:  Soft, nontender, nondistended EXTREMITIES: Left BKA NEURO:  No new focal deficits appreciated SKIN: Left BKA stump erythema and edema with tenderness and mild fluctuance.  See pictures on H&P.   PSYCH:  Appropriate mood and affect   Data Reviewed:  There are no new results to review at this time.  Family Communication: None at bedside  Disposition: Status is: Inpatient Remains inpatient appropriate because: Stump infection requiring IV antibiotics  Planned Discharge Destination: Home  with Home Health    Time spent: 35 minutes  Author: Deanna Artis, DO 01/25/2024 12:05 PM  For on call review [email or web address].

## 2024-01-26 DIAGNOSIS — L03116 Cellulitis of left lower limb: Secondary | ICD-10-CM | POA: Diagnosis not present

## 2024-01-26 DIAGNOSIS — L0390 Cellulitis, unspecified: Secondary | ICD-10-CM | POA: Diagnosis not present

## 2024-01-26 LAB — CBC
HCT: 26.4 % — ABNORMAL LOW (ref 39.0–52.0)
Hemoglobin: 8.9 g/dL — ABNORMAL LOW (ref 13.0–17.0)
MCH: 26.1 pg (ref 26.0–34.0)
MCHC: 33.7 g/dL (ref 30.0–36.0)
MCV: 77.4 fL — ABNORMAL LOW (ref 80.0–100.0)
Platelets: 239 10*3/uL (ref 150–400)
RBC: 3.41 MIL/uL — ABNORMAL LOW (ref 4.22–5.81)
RDW: 16.3 % — ABNORMAL HIGH (ref 11.5–15.5)
WBC: 7.1 10*3/uL (ref 4.0–10.5)
nRBC: 0 % (ref 0.0–0.2)

## 2024-01-26 LAB — COMPREHENSIVE METABOLIC PANEL
ALT: 11 U/L (ref 0–44)
AST: 12 U/L — ABNORMAL LOW (ref 15–41)
Albumin: 2.1 g/dL — ABNORMAL LOW (ref 3.5–5.0)
Alkaline Phosphatase: 73 U/L (ref 38–126)
Anion gap: 6 (ref 5–15)
BUN: 30 mg/dL — ABNORMAL HIGH (ref 6–20)
CO2: 17 mmol/L — ABNORMAL LOW (ref 22–32)
Calcium: 8.4 mg/dL — ABNORMAL LOW (ref 8.9–10.3)
Chloride: 111 mmol/L (ref 98–111)
Creatinine, Ser: 2.22 mg/dL — ABNORMAL HIGH (ref 0.61–1.24)
GFR, Estimated: 36 mL/min — ABNORMAL LOW (ref >60)
Glucose, Bld: 296 mg/dL — ABNORMAL HIGH (ref 70–99)
Potassium: 3.5 mmol/L (ref 3.5–5.1)
Sodium: 134 mmol/L — ABNORMAL LOW (ref 135–145)
Total Bilirubin: 0.6 mg/dL (ref 0.0–1.2)
Total Protein: 6.1 g/dL — ABNORMAL LOW (ref 6.5–8.1)

## 2024-01-26 LAB — GLUCOSE, CAPILLARY
Glucose-Capillary: 318 mg/dL — ABNORMAL HIGH (ref 70–99)
Glucose-Capillary: 335 mg/dL — ABNORMAL HIGH (ref 70–99)
Glucose-Capillary: 295 mg/dL — ABNORMAL HIGH (ref 70–99)
Glucose-Capillary: 313 mg/dL — ABNORMAL HIGH (ref 70–99)

## 2024-01-26 LAB — MAGNESIUM: Magnesium: 2.3 mg/dL (ref 1.7–2.4)

## 2024-01-26 MED ORDER — SODIUM BICARBONATE 4.2 % IV SOLN
50.0000 meq | Freq: Once | INTRAVENOUS | Status: AC
  Administered 2024-01-26: 50 meq via INTRAVENOUS
  Filled 2024-01-26: qty 100

## 2024-01-26 MED ORDER — INSULIN GLARGINE 100 UNIT/ML ~~LOC~~ SOLN
35.0000 [IU] | Freq: Every day | SUBCUTANEOUS | Status: DC
  Administered 2024-01-26 – 2024-01-28 (×3): 35 [IU] via SUBCUTANEOUS
  Filled 2024-01-26 (×4): qty 0.35

## 2024-01-26 MED ORDER — FENTANYL CITRATE PF 50 MCG/ML IJ SOSY
25.0000 ug | PREFILLED_SYRINGE | INTRAMUSCULAR | Status: DC | PRN
  Administered 2024-01-26 – 2024-01-27 (×2): 25 ug via INTRAVENOUS
  Filled 2024-01-26 (×2): qty 1

## 2024-01-26 NOTE — Plan of Care (Signed)

## 2024-01-26 NOTE — Plan of Care (Signed)
  Problem: Fluid Volume: Goal: Ability to maintain a balanced intake and output will improve Outcome: Progressing   Problem: Health Behavior/Discharge Planning: Goal: Ability to manage health-related needs will improve Outcome: Progressing   Problem: Nutritional: Goal: Maintenance of adequate nutrition will improve Outcome: Progressing   Problem: Tissue Perfusion: Goal: Adequacy of tissue perfusion will improve Outcome: Progressing   Problem: Education: Goal: Knowledge of General Education information will improve Description: Including pain rating scale, medication(s)/side effects and non-pharmacologic comfort measures Outcome: Progressing   Problem: Clinical Measurements: Goal: Respiratory complications will improve Outcome: Progressing   Problem: Nutrition: Goal: Adequate nutrition will be maintained Outcome: Progressing   Problem: Pain Managment: Goal: General experience of comfort will improve and/or be controlled Outcome: Progressing   Problem: Safety: Goal: Ability to remain free from injury will improve Outcome: Progressing

## 2024-01-26 NOTE — Progress Notes (Signed)
 Progress Note   Patient: Richard Mendoza ZOX:[id number] DOB: 01/29/1976 DOA: 01/23/2024     3 DOS: the patient was seen and examined on 01/26/2024   Brief hospital course: 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  Assessment and Plan:  LLE stump infection - Presentation with worsening erythema and edema and ill fitting prosthesis.  Patient reports.  Multiple previous hospitalizations for stump infection, most recent in October 2020 for, treated conservatively with antibiotics.  Previous recommendations by vascular surgery patient made require conversion from BKA to AKA.  Patient however is resistant against this recommendation.  When compared against last presentation, repeat MRI showing interval increase in subcutaneous fat edema and swelling as well as new abscesses in the anterior aspect.  I also noting continued chronic appearing proximal tibial osteomyelitis.  Vascular surgery consulted and following closely.  Continues on empiric cefepime, Flagyl, vancomycin for the time being.  Monitor edema and erythema.  Will recheck CBC in AM.  Likely that patient will need to pursue AKA.  Severe hypokalemia - Potassium 2.4 on presentation.  Similar to previous hospitalizations.  IV and p.o. supplementation on board.  Showing improvement, up to 3.5 this morning.  Will recheck BMP and magnesium tomorrow morning.  Acute kidney injury on CKD 3a - Previous creatinine baseline 1.4, now up to 2.5 on presentation.  Likely prerenal etiology exacerbation from uncontrolled diabetes.  IV fluid hydration on board.  Showing slow improvement.  Will monitor urine output and recheck BMP in AM.  Mixed acid-base disorder - BMP remarkable for persistent mild acidemia.  Likely in setting of CKD will initiate p.o. sodium bicarb 650 mg  3 times daily.  Will recheck BMP in AM.  Uncontrolled insulin-dependent diabetes mellitus - Exacerbated by infection.  Glucose greater than 600 on presentation.  Responded well to insulin coverage.  A1c 13.6 suggesting very poor control of her complaints prior to admission.  Still not well-controlled while inpatient.  Will increase insulin glargine from 30 units daily to 35 units daily.  Continue insulin sliding scale.  Tobacco use -Counseled on cessation.  Nicotine patch as needed  Major depressive disorder, recurrent, in remission (HCC) -Continue trazodone 100 mg nightly          Subjective: Patient sitting up in bed, comfortable.  Denies any fever, chills, chest pain, nausea, vomiting, abdominal pain.  Does admit that it appears less red and less painful than yesterday.  Physical Exam: Vitals:   01/26/24 0018 01/26/24 0547 01/26/24 0746 01/26/24 1147  BP: 134/80 137/78 (!) 162/91 (!) 159/86  Pulse: (!) 54 (!) 57 66 71  Resp: 16 15    Temp: 98.3 F (36.8 C) 98 F (36.7 C) 97.7 F (36.5 C) 97.9 F (36.6 C)  TempSrc: Oral Oral    SpO2: 100% 99% 99% 100%  Weight:      Height:       GENERAL:  Alert, pleasant, no acute distress  HEENT:  EOMI CARDIOVASCULAR:  RRR, no murmurs appreciated RESPIRATORY:  Clear to auscultation, no wheezing, rales, or rhonchi GASTROINTESTINAL:  Soft, nontender, nondistended EXTREMITIES: Left BKA NEURO:  No new focal deficits appreciated SKIN: Left BKA stump improved erythema and edema.   PSYCH:  Appropriate mood and affect   Data Reviewed:  There are no new results to review at this time.  Family Communication: None at bedside  Disposition: Status is: Inpatient Remains inpatient appropriate because: Stump infection requiring IV antibiotics  Planned Discharge Destination: Home with Home Health    Time spent: 31 minutes  Author: Deanna Artis, DO 01/26/2024 12:16 PM  For on call review [email or web address].

## 2024-01-27 DIAGNOSIS — L03116 Cellulitis of left lower limb: Secondary | ICD-10-CM | POA: Diagnosis not present

## 2024-01-27 LAB — CBC
HCT: 25.8 % — ABNORMAL LOW (ref 39.0–52.0)
Hemoglobin: 8.5 g/dL — ABNORMAL LOW (ref 13.0–17.0)
MCH: 26 pg (ref 26.0–34.0)
MCHC: 32.9 g/dL (ref 30.0–36.0)
MCV: 78.9 fL — ABNORMAL LOW (ref 80.0–100.0)
Platelets: 257 10*3/uL (ref 150–400)
RBC: 3.27 MIL/uL — ABNORMAL LOW (ref 4.22–5.81)
RDW: 16.1 % — ABNORMAL HIGH (ref 11.5–15.5)
WBC: 8.7 10*3/uL (ref 4.0–10.5)
nRBC: 0 % (ref 0.0–0.2)

## 2024-01-27 LAB — GLUCOSE, CAPILLARY
Glucose-Capillary: 212 mg/dL — ABNORMAL HIGH (ref 70–99)
Glucose-Capillary: 221 mg/dL — ABNORMAL HIGH (ref 70–99)
Glucose-Capillary: 194 mg/dL — ABNORMAL HIGH (ref 70–99)
Glucose-Capillary: 128 mg/dL — ABNORMAL HIGH (ref 70–99)

## 2024-01-27 LAB — BASIC METABOLIC PANEL
Anion gap: 6 (ref 5–15)
BUN: 37 mg/dL — ABNORMAL HIGH (ref 6–20)
CO2: 18 mmol/L — ABNORMAL LOW (ref 22–32)
Calcium: 8.3 mg/dL — ABNORMAL LOW (ref 8.9–10.3)
Chloride: 111 mmol/L (ref 98–111)
Creatinine, Ser: 2.34 mg/dL — ABNORMAL HIGH (ref 0.61–1.24)
GFR, Estimated: 34 mL/min — ABNORMAL LOW (ref >60)
Glucose, Bld: 256 mg/dL — ABNORMAL HIGH (ref 70–99)
Potassium: 3.5 mmol/L (ref 3.5–5.1)
Sodium: 135 mmol/L (ref 135–145)

## 2024-01-27 LAB — VITAMIN B12: Vitamin B-12: 555 pg/mL (ref 180–914)

## 2024-01-27 LAB — PROTIME-INR
INR: 1.1 (ref 0.8–1.2)
Prothrombin Time: 14.3 s (ref 11.4–15.2)

## 2024-01-27 LAB — IRON AND TIBC
Iron: 42 ug/dL — ABNORMAL LOW (ref 45–182)
Saturation Ratios: 20 % (ref 17.9–39.5)
TIBC: 213 ug/dL — ABNORMAL LOW (ref 250–450)
UIBC: 171 ug/dL

## 2024-01-27 LAB — VITAMIN D 25 HYDROXY (VIT D DEFICIENCY, FRACTURES): Vit D, 25-Hydroxy: 15.07 ng/mL — ABNORMAL LOW (ref 30–100)

## 2024-01-27 LAB — FOLATE: Folate: 9.2 ng/mL (ref >5.9)

## 2024-01-27 LAB — MAGNESIUM: Magnesium: 2.2 mg/dL (ref 1.7–2.4)

## 2024-01-27 MED ORDER — MORPHINE SULFATE (PF) 2 MG/ML IV SOLN
2.0000 mg | INTRAVENOUS | Status: DC | PRN
  Administered 2024-01-27: 2 mg via INTRAVENOUS
  Filled 2024-01-27: qty 1

## 2024-01-27 MED ORDER — MORPHINE SULFATE 15 MG PO TABS
15.0000 mg | ORAL_TABLET | Freq: Four times a day (QID) | ORAL | Status: DC | PRN
  Administered 2024-01-27 (×2): 15 mg via ORAL
  Filled 2024-01-27 (×2): qty 1

## 2024-01-27 MED ORDER — ENOXAPARIN SODIUM 40 MG/0.4ML IJ SOSY
40.0000 mg | PREFILLED_SYRINGE | Freq: Every evening | INTRAMUSCULAR | Status: DC
  Administered 2024-01-27 – 2024-02-04 (×8): 40 mg via SUBCUTANEOUS
  Filled 2024-01-27 (×10): qty 0.4

## 2024-01-27 NOTE — TOC Initial Note (Signed)
 Transition of Care [facility]) - Initial/Assessment Note    Patient Details  Name: Richard Mendoza MRN: [id number] Date of Birth: 12/24/1975  [facility]) CM/SW Contact:    Truddie Hidden, RN Phone Number: 01/27/2024, 2:59 PM  Clinical Narrative:                 Spoke with patient regarding discharge. He inquired about IV antibiotics and HH stating options this RNCM give him will help him make a decision about surgery.  He was advised due to his payor source not being widely accepted [facility] is highly unlikely. He was advised at discharge he would likely have to participate in outpatient therapy and outpatient infusions. He inquired about transportation to outpatient appointments. He was advised of his medicaid benefit for transportation. Patient stated he currently lives with son but unsure if he is able to return to that address. He is also separated with from his wife, the address currently listed on file. RNCM advised housing resources would be provided.   Housing resources added to AVS.         Patient Goals and CMS Choice            Expected Discharge Plan and Services                                              Prior Living Arrangements/Services                       Activities of Daily Living   ADL Screening (condition at time of admission) Independently performs ADLs?: Yes (appropriate for developmental age) Is the patient deaf or have difficulty hearing?: No Does the patient have difficulty seeing, even when wearing glasses/contacts?: No Does the patient have difficulty concentrating, remembering, or making decisions?: No  Permission Sought/Granted                  Emotional Assessment              Admission diagnosis:  Wound cellulitis [L03.90] Cellulitis, unspecified cellulitis site [L03.90] Patient Active Problem List   Diagnosis Date Noted   Cellulitis 01/23/2024   Staphylococcal arthritis of right shoulder (HCC)  08/29/2023   Bacteremia due to Staphylococcus aureus 08/29/2023   Acute bursitis of right shoulder 08/24/2023   Iron deficiency anemia, unspecified 08/23/2023   Overweight (BMI 25.0-29.9) 08/22/2023   MRSA bacteremia 08/22/2023   Amputation stump infection (HCC) 08/20/2023   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) 06/01/2023   Chronic kidney disease, stage 3a (HCC) 06/01/2023   Tobacco abuse 06/01/2023   Cellulitis and abscess of left leg 04/30/2023   Cellulitis of left lower extremity 04/29/2023   Tobacco use 04/29/2023   Carpal tunnel syndrome 07/12/2022   Hyperglycemia due to type 2 diabetes mellitus (HCC) 07/12/2022   Polyneuropathy 07/12/2022   Ulnar neuropathy 07/12/2022   Acute epigastric pain 07/12/2022   Gastro-esophageal reflux disease without esophagitis 07/12/2022   Nausea and vomiting 07/12/2022   Abscess of right upper extremity 02/26/2022   Cellulitis of left lower extremity without foot    Sepsis (HCC) 09/08/2021   Hypokalemia 09/06/2021   Hyponatremia 09/06/2021   PAD (peripheral artery disease) (HCC) 05/26/2021   Acute kidney injury superimposed on chronic kidney disease (HCC) 04/03/2021   Syncope 04/03/2021   Neck pain    Abnormal  weight loss 12/09/2020   Change in bowel habit 12/09/2020   Constipation 12/09/2020   Epigastric pain 12/09/2020   Drug-induced constipation 12/09/2020   Periumbilical pain 12/09/2020   Fatty liver 12/09/2020   S/P BKA (below knee amputation), left (HCC) 11/20/2020   Necrotizing fasciitis of ankle and foot (HCC) 11/07/2020   History of 2019 novel coronavirus disease (COVID-19) 09/01/2020   Major depressive disorder, recurrent, in remission (HCC) 09/01/2020   Pain management contract signed 09/07/2019   Evaluation by psychiatric service required 08/18/2019   History of depression 08/18/2019   Primary osteoarthritis of both shoulders 07/16/2019   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) 07/16/2019    Chronic pain of both shoulders 07/16/2019   Cervical radicular pain 07/16/2019   S/P cervical spinal fusion 07/16/2019   Cervical spondylosis 07/16/2019   Cervical facet joint syndrome 07/16/2019   Chronic pain syndrome 07/16/2019   Anemia of chronic disease 05/25/2019   Osteomyelitis (HCC) 05/25/2019   Diabetic foot infection (HCC) 03/05/2019   Abscess 11/25/2018   Diabetic peripheral neuropathy associated with type 2 diabetes mellitus (HCC) 06/16/2018   Hyperlipidemia associated with type 2 diabetes mellitus (HCC) 06/16/2018   Intractable nausea and vomiting 10/21/2017   HNP (herniated nucleus pulposus), cervical 09/09/2017   Displacement of cervical intervertebral disc without myelopathy 08/27/2017   Erectile dysfunction 07/31/2017   Gastroesophageal reflux disease 07/31/2017   Rotator cuff syndrome of left shoulder 07/31/2017   Cellulitis of right leg 07/30/2016   Tobacco abuse disorder 07/30/2016   Routine general medical examination at a health care facility 01/20/2014   Diabetic neuropathy, type I diabetes mellitus (HCC) 01/20/2014   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   HLD (hyperlipidemia) 03/17/2009   DM type 2 with diabetic peripheral neuropathy (HCC) 03/02/2009   Uncontrolled diabetes mellitus with hyperglycemia (HCC) 12/08/2008   OBSTRUCTIVE SLEEP APNEA 12/08/2008   Essential hypertension 12/08/2008   Allergic rhinitis 12/15/2007   Other malaise and fatigue 12/15/2007   PCP:  Barbette Reichmann, MD Pharmacy:   [id number] [place], [place] - [address] [place] [address] Phone: [phone] Fax: [phone]  [id number] - [place], [place] - [address] AT [address] [place] [address] Phone: [phone] Fax: [phone]  [id number] [place], [place] - [address] AT [place] [facility] OF [address] [place]   [address] Phone: [phone] Fax: [phone]  [facility] [address] [place] [address] Phone: [phone] Fax: [phone]     Social Drivers of Health (SDOH) Social History: SDOH Screenings   Food Insecurity: No Food Insecurity (01/25/2024)  Housing: Unknown (01/25/2024)  Transportation Needs: No Transportation Needs (01/25/2024)  Utilities: Not At Risk (01/25/2024)  Depression (PHQ2-9): Low Risk  (09/24/2023)  Financial Resource Strain: Low Risk  (07/24/2022)   Received from [facility]  Social Connections: Unknown (03/21/2022)   Received from [facility], [facility]  Stress: No Stress Concern Present (07/24/2022)   Received from [facility], [facility]  Tobacco Use: High Risk (01/23/2024)   SDOH Interventions:     Readmission Risk Interventions    01/24/2024   12:35 PM 08/21/2023    9:03 AM  Readmission Risk Prevention Plan  Transportation Screening Complete Complete  PCP or Specialist Appt within 3-5 Days Complete Complete  HRI or Home Care Consult Complete   Social Work Consult for Recovery Care Planning/Counseling Complete Complete  Palliative Care Screening Not Applicable Not Applicable  Medication Review (RN Care Manager) Complete Complete

## 2024-01-27 NOTE — Plan of Care (Signed)
  Problem: Education: Goal: Ability to describe self-care measures that may prevent or decrease complications (Diabetes Survival Skills Education) will improve Outcome: Progressing Goal: Individualized Educational Video(s) Outcome: Progressing   Problem: Coping: Goal: Ability to adjust to condition or change in health will improve Outcome: Progressing   Problem: Fluid Volume: Goal: Ability to maintain a balanced intake and output will improve Outcome: Progressing   Problem: Health Behavior/Discharge Planning: Goal: Ability to identify and utilize available resources and services will improve Outcome: Progressing Goal: Ability to manage health-related needs will improve Outcome: Progressing   Problem: Metabolic: Goal: Ability to maintain appropriate glucose levels will improve Outcome: Progressing   Problem: Nutritional: Goal: Maintenance of adequate nutrition will improve Outcome: Progressing Goal: Progress toward achieving an optimal weight will improve Outcome: Progressing   Problem: Skin Integrity: Goal: Risk for impaired skin integrity will decrease Outcome: Progressing   Problem: Tissue Perfusion: Goal: Adequacy of tissue perfusion will improve Outcome: Progressing   Problem: Education: Goal: Knowledge of General Education information will improve Description: Including pain rating scale, medication(s)/side effects and non-pharmacologic comfort measures Outcome: Progressing   Problem: Health Behavior/Discharge Planning: Goal: Ability to manage health-related needs will improve Outcome: Progressing   Problem: Clinical Measurements: Goal: Ability to maintain clinical measurements within normal limits will improve Outcome: Progressing Goal: Will remain free from infection Outcome: Progressing Goal: Diagnostic test results will improve Outcome: Progressing Goal: Respiratory complications will improve Outcome: Progressing Goal: Cardiovascular complication will  be avoided Outcome: Progressing   Problem: Activity: Goal: Risk for activity intolerance will decrease Outcome: Progressing   Problem: Nutrition: Goal: Adequate nutrition will be maintained Outcome: Progressing   Problem: Elimination: Goal: Will not experience complications related to bowel motility Outcome: Progressing   Problem: Elimination: Goal: Will not experience complications related to bowel motility Outcome: Progressing Goal: Will not experience complications related to urinary retention Outcome: Progressing   Problem: Coping: Goal: Level of anxiety will decrease Outcome: Progressing   Problem: Pain Managment: Goal: General experience of comfort will improve and/or be controlled Outcome: Progressing   Problem: Safety: Goal: Ability to remain free from injury will improve Outcome: Progressing   Problem: Skin Integrity: Goal: Risk for impaired skin integrity will decrease Outcome: Progressing   Problem: Clinical Measurements: Goal: Ability to avoid or minimize complications of infection will improve Outcome: Progressing   Problem: Skin Integrity: Goal: Skin integrity will improve Outcome: Progressing

## 2024-01-27 NOTE — Progress Notes (Signed)
 Progress Note   Patient: Richard Mendoza ZOX:[id number] DOB: [birth year] DOA: 01/23/2024     4 DOS: the patient was seen and examined on 01/27/2024   Brief hospital course: 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  Assessment and Plan:  Left tibial osteomyelitis and stump abscess Presented with worsening erythema and edema and ill fitting prosthesis.   S/p Multiple previous hospitalizations for stump infection, most recent in October 2020 for, treated conservatively with antibiotics.  Previous recommendations by vascular surgery patient made require conversion from BKA to AKA.  Patient however is resistant against this recommendation.  When compared against last presentation, repeat MRI showing interval increase in subcutaneous fat edema and swelling as well as new abscesses in the anterior aspect. continued chronic appearing proximal tibial osteomyelitis.   Patient was sent by vascular surgery for admission, they are aware, when patient will agree for AKA then please consult vascular surgery Continues on empiric cefepime, Flagyl, vancomycin for the time being.   Monitor edema and erythema.   Will recheck CBC in AM.  Likely that patient will need to pursue AKA. 3/10 d/w patient to make a final decision to go home with IV antibiotics versus above-knee amputation so that we can call appropriate consult ID versus vascular surgery and plan accordingly.   Severe hypokalemia, resolved Potassium repleted. Monitor electrolytes daily.  Acute kidney injury on CKD 3a - Previous creatinine baseline 1.4, now up to 2.5 on presentation.  Likely prerenal etiology exacerbation from uncontrolled diabetes.   Creatinine 2.34 Continue to monitor renal functions Continue oral hydration Repeat BMP tomorrow  a.m.  Metabolic acidosis due to renal failure Continue oral bicarbonate. Repeat BMP daily   Uncontrolled insulin-dependent diabetes mellitus - Exacerbated by infection.  Glucose greater than 600 on presentation.  Responded well to insulin coverage.  A1c 13.6 suggesting very poor control of her complaints prior to admission.  Still not well-controlled while inpatient.  Will increase insulin glargine from 30 units daily to 35 units daily.  Continue insulin sliding scale.  Tobacco use -Counseled on cessation.  Nicotine patch as needed  Major depressive disorder, recurrent, in remission (HCC) -Continue trazodone 100 mg nightly  Anemia of chronic disease most likely due to CKD Iron profile, B12 and folate level within normal range Monitor H&H and transfuse hemoglobin less than 7   DVT prophylaxis: Lovenox [place]   Active Pressure Injury/Wound(s)     Pressure Ulcer  Duration          Pressure Injury 04/30/23 Knee Left;Medial Red erythema, abrasion to distal L knee 272 days            Subjective: Patient sitting up in bed, comfortable.  Denies any fever, chills, chest pain, nausea, vomiting, abdominal pain.  Does admit that it appears less red and less painful than yesterday.  Physical Exam: Vitals:   01/27/24 0834 01/27/24 0920 01/27/24 1144 01/27/24 1517  BP: (!) 159/93  (!) 161/92 (!) 149/84  Pulse: 68  67 62  Resp:  18 19 17   Temp: (!) 97.4 F (36.3 C)  97.6 F (36.4 C) 97.6 F (36.4 C)  TempSrc:   Oral Oral  SpO2: 97%  100% 100%  Weight:      Height:       GENERAL:  Alert, pleasant, no acute distress  HEENT:  EOMI CARDIOVASCULAR:  RRR,  no murmurs appreciated RESPIRATORY:  Clear to auscultation, no wheezing, rales, or rhonchi GASTROINTESTINAL:  Soft, nontender, nondistended EXTREMITIES: Left BKA NEURO:  No new focal deficits appreciated SKIN: Left BKA stump erythema, edema and tenderness PSYCH:  Appropriate mood and affect   Data Reviewed:  There are no new  results to review at this time.  Family Communication: None at bedside   Disposition: Status is: Inpatient Remains inpatient appropriate because: Stump infection requiring IV antibiotics  Planned Discharge Destination: Home with Home Health    Time spent: 35 minutes  Author: Gillis Santa, MD 01/27/2024 5:47 PM  For on call review [email or web address].

## 2024-01-27 NOTE — Inpatient Diabetes Management (Addendum)
 Inpatient Diabetes Program Recommendations  AACE/ADA: New Consensus Statement on Inpatient Glycemic Control (2015)  Target Ranges:  Prepandial:   less than 140 mg/dL      Peak postprandial:   less than 180 mg/dL (1-2 hours)      Critically ill patients:  140 - 180 mg/dL    Latest Reference Range & Units 01/26/24 08:01 01/26/24 11:42 01/26/24 16:28 01/26/24 21:37  Glucose-Capillary 70 - 99 mg/dL 213 (H)  7 units Novolog  35 units Lantus  335 (H)  7 units Novolog  295 (H)  5 units Novolog  313 (H)  4 units Novolog   (H): Data is abnormally high  Latest Reference Range & Units 01/27/24 08:36  Glucose-Capillary 70 - 99 mg/dL 086 (H)  3 units Novolog  35 units Lantus  (H): Data is abnormally high  Admit with: R BKA Stump Infection  History: DM2  Home DM Meds: Medtronic insulin pump Fiasp Insulin Dexcom G6 CGM  Current Orders: Lantus 35 units daily Novolog Sensitive Correction Scale/ SSI (0-9 units) TID AC + HS    MD- Please consider:  1) Increase Lantus to 40 units daily  2) Start Novolog Meal Coverage: Novolog 4 units TID with meals HOLD if pt NPO HOLD if pt eats <50% meals  3. Pt requesting Rx for Novolog to use in his insulin pump at time of discharge    Addendum 11:50am--Met w/ pt at bedside.  Confirmed pt is using his older Medtronic insulin pump at home.  Ran out of Fiasp Insulin and couldn't get refill b/c his former ENDO left the practice.  Pt went to [employer] and bought Regular (R) insulin OTC and placed this insulin in his pump.  Has all pump supplies at home.  Just needs insulin.  He is hoping [facility] will be able to work him in with a new MD over at the ENDO practice but for now he has an appt with Dr. Fransico Him in [place] [facility] in June.  Wants to resume his insulin pump when he goes home and requested Rx for Novolog.  Usually uses Fiasp insulin but was told there is a shortage of this insulin in the [place] and is OK using Novolog.   Understands  that he will need to be mindful of when last dose Basal insulin given in hospital so he will know when to resume the basal rates on his pump when he goes home.  When he finally sees ENDO, plans to get help setting up his Omnipod insulin pump and will stop his old insulin pump.  Told me he has to make a decision by tomorrow about whether or not to proceed with AKA of his R Stump.  Explained to pt that I have requested insulin adjustments for him in the hospital and that our team will follow his CBGs and help the MD with adjustments.  Pt appreciative of visit.      He was hospitalized last October and missed his endocrinology appointment Has not been able to reschedule nor get refills on insulin  Meagan Younts is no longer with the practice. Their system is currently down and they will call him with a post hospital appointment. Notified patient; told him to call the office if they do not call him in the next couple of days.  Also made a new patient appointment with Ronny Bacon with Dr. Isidoro Donning (endocrinology) office for June 4th.  Patient has Omnipod pump which Meagan Younts prescribed back in October; he needs to  see endocrinology for teaching and settings  Provided with Dexcom G7 sample 03/07    --Will follow patient during hospitalization--  Ambrose Finland RN, MSN, CDCES Diabetes Coordinator Inpatient Glycemic Control Team Team Pager: [phone] (8a-5p)

## 2024-01-28 DIAGNOSIS — L03116 Cellulitis of left lower limb: Secondary | ICD-10-CM | POA: Diagnosis not present

## 2024-01-28 LAB — CULTURE, BLOOD (ROUTINE X 2)
Culture: NO GROWTH
Culture: NO GROWTH
Special Requests: ADEQUATE

## 2024-01-28 LAB — BASIC METABOLIC PANEL
Anion gap: 8 (ref 5–15)
BUN: 36 mg/dL — ABNORMAL HIGH (ref 6–20)
CO2: 18 mmol/L — ABNORMAL LOW (ref 22–32)
Calcium: 8.4 mg/dL — ABNORMAL LOW (ref 8.9–10.3)
Chloride: 109 mmol/L (ref 98–111)
Creatinine, Ser: 2.33 mg/dL — ABNORMAL HIGH (ref 0.61–1.24)
GFR, Estimated: 34 mL/min — ABNORMAL LOW (ref >60)
Glucose, Bld: 89 mg/dL (ref 70–99)
Potassium: 3.3 mmol/L — ABNORMAL LOW (ref 3.5–5.1)
Sodium: 135 mmol/L (ref 135–145)

## 2024-01-28 LAB — GLUCOSE, CAPILLARY
Glucose-Capillary: 104 mg/dL — ABNORMAL HIGH (ref 70–99)
Glucose-Capillary: 129 mg/dL — ABNORMAL HIGH (ref 70–99)
Glucose-Capillary: 93 mg/dL (ref 70–99)
Glucose-Capillary: 88 mg/dL (ref 70–99)

## 2024-01-28 LAB — CBC
HCT: 26.6 % — ABNORMAL LOW (ref 39.0–52.0)
Hemoglobin: 9 g/dL — ABNORMAL LOW (ref 13.0–17.0)
MCH: 26.5 pg (ref 26.0–34.0)
MCHC: 33.8 g/dL (ref 30.0–36.0)
MCV: 78.5 fL — ABNORMAL LOW (ref 80.0–100.0)
Platelets: 292 10*3/uL (ref 150–400)
RBC: 3.39 MIL/uL — ABNORMAL LOW (ref 4.22–5.81)
RDW: 15.9 % — ABNORMAL HIGH (ref 11.5–15.5)
WBC: 8.9 10*3/uL (ref 4.0–10.5)
nRBC: 0 % (ref 0.0–0.2)

## 2024-01-28 LAB — MAGNESIUM: Magnesium: 2.3 mg/dL (ref 1.7–2.4)

## 2024-01-28 LAB — VANCOMYCIN, PEAK: Vancomycin Pk: 38 ug/mL (ref 30–40)

## 2024-01-28 LAB — PHOSPHORUS: Phosphorus: 4 mg/dL (ref 2.5–4.6)

## 2024-01-28 LAB — VANCOMYCIN, RANDOM: Vancomycin Rm: 22 ug/mL

## 2024-01-28 MED ORDER — CEFAZOLIN SODIUM-DEXTROSE 2-4 GM/100ML-% IV SOLN
2.0000 g | INTRAVENOUS | Status: AC
  Administered 2024-01-29: 2 g via INTRAVENOUS

## 2024-01-28 MED ORDER — SODIUM CHLORIDE 0.9 % IV SOLN: INTRAVENOUS | Status: DC

## 2024-01-28 MED ORDER — POLYSACCHARIDE IRON COMPLEX 150 MG PO CAPS
150.0000 mg | ORAL_CAPSULE | Freq: Every day | ORAL | Status: DC
  Administered 2024-01-28: 150 mg via ORAL
  Filled 2024-01-28 (×2): qty 1

## 2024-01-28 MED ORDER — VITAMIN D (ERGOCALCIFEROL) 1.25 MG (50000 UNIT) PO CAPS
50000.0000 [IU] | ORAL_CAPSULE | ORAL | Status: DC
  Administered 2024-01-28 – 2024-02-04 (×2): 50000 [IU] via ORAL
  Filled 2024-01-28 (×2): qty 1

## 2024-01-28 MED ORDER — VITAMIN C 500 MG PO TABS
500.0000 mg | ORAL_TABLET | Freq: Every day | ORAL | Status: DC
  Administered 2024-01-28 – 2024-02-05 (×8): 500 mg via ORAL
  Filled 2024-01-28 (×8): qty 1

## 2024-01-28 MED ORDER — CHLORHEXIDINE GLUCONATE 4 % EX SOLN
60.0000 mL | Freq: Once | CUTANEOUS | Status: AC
  Administered 2024-01-28: 4 via TOPICAL

## 2024-01-28 MED ORDER — HYDROMORPHONE HCL 2 MG PO TABS
2.0000 mg | ORAL_TABLET | Freq: Four times a day (QID) | ORAL | Status: DC | PRN
  Administered 2024-01-28 – 2024-02-02 (×6): 2 mg via ORAL
  Filled 2024-01-28 (×8): qty 1

## 2024-01-28 MED ORDER — VANCOMYCIN HCL 750 MG/150ML IV SOLN
750.0000 mg | INTRAVENOUS | Status: DC
  Administered 2024-01-29 (×2): 750 mg via INTRAVENOUS
  Filled 2024-01-28 (×2): qty 150

## 2024-01-28 MED ORDER — HYDROMORPHONE HCL 1 MG/ML IJ SOLN: 1.0000 mg | Freq: Three times a day (TID) | INTRAMUSCULAR | Status: DC | PRN

## 2024-01-28 MED ORDER — CHLORHEXIDINE GLUCONATE 4 % EX SOLN
60.0000 mL | Freq: Once | CUTANEOUS | Status: AC
  Administered 2024-01-29: 4 via TOPICAL

## 2024-01-28 MED ORDER — KETOROLAC TROMETHAMINE 15 MG/ML IJ SOLN
15.0000 mg | Freq: Two times a day (BID) | INTRAMUSCULAR | Status: AC | PRN
  Administered 2024-01-28 – 2024-01-30 (×3): 15 mg via INTRAVENOUS
  Filled 2024-01-28 (×3): qty 1

## 2024-01-28 MED ORDER — POTASSIUM CHLORIDE CRYS ER 20 MEQ PO TBCR
40.0000 meq | EXTENDED_RELEASE_TABLET | Freq: Once | ORAL | Status: AC
  Administered 2024-01-28: 40 meq via ORAL
  Filled 2024-01-28: qty 2

## 2024-01-28 NOTE — Plan of Care (Signed)
  Problem: Education: Goal: Ability to describe self-care measures that may prevent or decrease complications (Diabetes Survival Skills Education) will improve Outcome: Progressing   Problem: Nutritional: Goal: Maintenance of adequate nutrition will improve Outcome: Progressing   Problem: Skin Integrity: Goal: Risk for impaired skin integrity will decrease Outcome: Progressing   Problem: Tissue Perfusion: Goal: Adequacy of tissue perfusion will improve Outcome: Progressing   Problem: Clinical Measurements: Goal: Ability to maintain clinical measurements within normal limits will improve Outcome: Progressing   Problem: Pain Managment: Goal: General experience of comfort will improve and/or be controlled Outcome: Progressing

## 2024-01-28 NOTE — Progress Notes (Signed)
 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  On workup via MRI the patient was noted to have osteomyelitis of the proximal tibia with an increase in subcutaneous fat edema.  Initial consult vascular surgery recommends BKA to AKA surgical procedure due to the osteomyelitis.  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   Vitals:   01/28/24 0511 01/28/24 0756  BP: 113/77 (!) 169/88  Pulse: (!) 56 69  Resp: 18 18  Temp: 97.9 F (36.6 C) 97.6 F (36.4 C)  SpO2: 100% 100%   Physical Exam: Cardiac:  RRR, normal S1-S2 Lungs: Lear on auscultation throughout.  Normal labored breathing. Incisions: None Extremities: Left lower extremity BKA with cellulitis and osteomyelitis.  Stump reddened due to cellulitis.  Drainage or infection noted. Abdomen: Positive bowel sounds throughout, soft, nontender nondistended. Neurologic: And oriented x 4, answers all questions and follows commands appropriately.  CBC    Component Value Date/Time   WBC 8.9 01/28/2024 0547   RBC 3.39 (L) 01/28/2024 0547   HGB 9.0 (L) 01/28/2024 0547   HCT 26.6 (L) 01/28/2024 0547   PLT 292 01/28/2024 0547   MCV 78.5 (L) 01/28/2024 0547   MCH 26.5 01/28/2024 0547   MCHC 33.8 01/28/2024 0547   RDW 15.9 (H) 01/28/2024 0547   LYMPHSABS 1.0 01/23/2024 1429   MONOABS 0.7 01/23/2024 1429   EOSABS 0.0 01/23/2024 1429   BASOSABS 0.0 01/23/2024 1429    BMET    Component Value Date/Time   NA 135 01/28/2024 0547   K 3.3 (L) 01/28/2024 0547   CL 109 01/28/2024 0547   CO2 18 (L) 01/28/2024 0547   GLUCOSE 89 01/28/2024 0547   BUN 36 (H) 01/28/2024 0547   CREATININE 2.33 (H) 01/28/2024 0547   CALCIUM 8.4 (L) 01/28/2024 0547    GFRNONAA 34 (L) 01/28/2024 0547   GFRAA >60 05/05/2020 1930    INR    Component Value Date/Time   INR 1.1 01/27/2024 0417     Intake/Output Summary (Last 24 hours) at 01/28/2024 1005 Last data filed at 01/28/2024 0700 Gross per 24 hour  Intake 400 ml  Output 2575 ml  Net -2175 ml     Assessment/Plan:  48 y.o. male who presents to [facility] emergency department with concerns of infection of left lower extremity BKA stump.* No surgery found *   PLAN On MRI of the patient's left lower extremity BKA patient was noted to have continued if not worsening left tibial osteomyelitis.  Is also noted to have soft tissue edema consistent with cellulitis.  Patient was seen back on 01/23/2024 in consultation for this issue.  We recommended BKA to AKA of the left lower extremity at that time.  Patient is now agreeable with having the procedure done.  Vascular surgery plans on taking the patient to the operating room tomorrow on 01/29/2024 for revision of the BKA to AKA of the left lower extremity.  I discussed in detail at the bedside with the patient, his mother and a family member the surgical procedure, benefits, risk, and complications.  All verbalized her understanding and wished to proceed.  Answered all their questions this morning.  Patient will be made n.p.o. after midnight for surgical procedure tomorrow.  I  discussed the case in detail with Dr. Festus Barren MD and he is in agreement with the plan.   Marcie Bal [facility] 01/28/2024 10:05 AM

## 2024-01-28 NOTE — Progress Notes (Signed)
 Progress Note   Patient: Richard Mendoza ZOX:[id number] DOB: Mar 31, 1976 DOA: 01/23/2024     5 DOS: the patient was seen and examined on 01/28/2024   Brief hospital course: Mr. 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  Assessment and Plan:  Left tibial osteomyelitis and stump abscess Presented with worsening erythema and edema and ill fitting prosthesis.   S/p Multiple previous hospitalizations for stump infection, most recent in October 2020 for, treated conservatively with antibiotics.  Previous recommendations by vascular surgery patient made require conversion from BKA to AKA.  Patient however is resistant against this recommendation.  When compared against last presentation, repeat MRI showing interval increase in subcutaneous fat edema and swelling as well as new abscesses in the anterior aspect. continued chronic appearing proximal tibial osteomyelitis.   Patient was sent by vascular surgery for admission, they are aware, when patient will agree for AKA then please consult vascular surgery Continues on empiric cefepime, Flagyl, vancomycin for the time being.   Monitor edema and erythema.   Will recheck CBC in AM.  Likely that patient will need to pursue AKA. 3/10 d/w patient to make a final decision to go home with IV antibiotics versus above-knee amputation so that we can call appropriate consult ID versus vascular surgery and plan accordingly. 3/11 patient agreed for appointment amputation.  Informed vascular surgery, we will keep n.p.o. for amputation tomorrow a.m.  Severe hypokalemia, resolved Potassium repleted. Monitor electrolytes daily.  Acute kidney injury on CKD 3a - Previous creatinine baseline 1.4, now up to 2.5 on presentation.  Likely prerenal etiology exacerbation from uncontrolled  diabetes.   Creatinine 2.34 Continue to monitor renal functions Continue oral hydration Repeat BMP tomorrow a.m.  Metabolic acidosis due to renal failure Continue oral bicarbonate. Repeat BMP daily   Uncontrolled insulin-dependent diabetes mellitus - Exacerbated by infection.  Glucose greater than 600 on presentation.  Responded well to insulin coverage.  A1c 13.6 suggesting very poor control of her complaints prior to admission.  Still not well-controlled while inpatient.  Will increase insulin glargine from 30 units daily to 35 units daily.  Continue insulin sliding scale.  Tobacco use -Counseled on cessation.  Nicotine patch as needed  Major depressive disorder, recurrent, in remission (HCC) -Continue trazodone 100 mg nightly  Vitamin D deficiency: started vitamin D 50,000 units p.o. weekly, follow with PCP to repeat vitamin D level after 3 to 6 months.  Anemia due to mild iron deficiency and CKD Folic acid and B12 within normal range Transferrin saturation 20% wnl but iron level is 42 slightly low Started oral iron supplement with vitamin C   DVT prophylaxis: Lovenox [place]   Active Pressure Injury/Wound(s)     Pressure Ulcer  Duration          Pressure Injury 04/30/23 Knee Left;Medial Red erythema, abrasion to distal L knee 272 days            Subjective: No significant events overnight, patient had pain in the left BKA stump 2/10, well-controlled with current medications. Patient agreed for left BKA, informed to vascular surgery.  Physical Exam: Vitals:   01/27/24 1949 01/27/24 2311 01/28/24 0511 01/28/24 0756  BP: (!) 172/90 (!) 141/84 113/77 (!) 169/88  Pulse: 63 (!) 57 (!) 56 69  Resp: 18 18 18 18   Temp: 97.8 F (36.6 C) 97.8 F (36.6 C)  97.9 F (36.6 C) 97.6 F (36.4 C)  TempSrc:  Oral  Oral  SpO2: 100% 100% 100% 100%  Weight:   91.5 kg   Height:   5\' 10"  (1.778 m)    GENERAL:  Alert, pleasant, no acute distress  HEENT:  EOMI CARDIOVASCULAR:  RRR,  no murmurs appreciated RESPIRATORY:  Clear to auscultation, no wheezing, rales, or rhonchi GASTROINTESTINAL:  Soft, nontender, nondistended EXTREMITIES: Left BKA stump with mild erythema and tenderness NEURO:  No new focal deficits appreciated SKIN: Left BKA stump erythema, edema and tenderness PSYCH:  Appropriate mood and affect   Data Reviewed:  There are no new results to review at this time.  Family Communication: None at bedside   Disposition: Status is: Inpatient Remains inpatient appropriate because: Stump infection requiring IV antibiotics  Planned Discharge Destination: Home with Home Health    Time spent: 35 minutes  Author: Gillis Santa, MD 01/28/2024 4:01 PM  For on call review [email or web address].

## 2024-01-28 NOTE — Consult Note (Signed)
 Pharmacy Antibiotic Note  Richard Mendoza is a 48 y.o. male admitted on 01/23/2024 with recurrent stump infection (L BKA).  Pharmacy has been consulted for Vancomycin and cefepime dosing. PMH includes recurrent MRSA infection,DM,and MSSA and group B streptococcus bacteremia in October 2024.  Today, 01/28/2024 Day # 5 vancomycin, cefepime, metronidazole Renal: SCr 2.33 - CKD (? SCr above baseline) WBC WNL Afebrile 3/6 blood cx: NGTD MRI - new abscesses in distal tibial stump (previous abscess improved), proximal tibial osteomyelitis - more well defined signal vs.previous scan but has not increased in size or extent  Vancomycin levels: Vancomycin dose 1gm IV q24h (last dose given 3/10 at 22:21 (levels with the 5th vancomycin dose)  Vancomycin peak 3/11 at 00:33 = 38 mcg/mL Vancomycin random 3/11 at 14:29 = 22 mcg/ml Calculated AUC 631.6  (Ke 0.0392, t1/2 =17.7hr) True Cmax = 39.8 mcg/ml and True Cmin = 16.2 mcg/mL   Plan: Based on vancomycin levels 3/11, AUC is SUPRAtherapetuic on current dose of vancomycin 1gm IV q24h.  Adjust vancomycin to 750mg  IV q24h New AUC = 473.6 (Goal 400-600) Cmax = 30 mcg/ml and Cmin 12.1 mcg/mL  Cefepime 2gm IV q 12 hours Await vascular surgery input to see if will have I&D or other surgery  Monitor renal function  Recheck vancomycin level in 4-5 days if remains on vancomycin    Height: 5\' 10"  (177.8 cm) Weight: 91.5 kg (201 lb 11.5 oz) IBW/kg (Calculated) : 73  Temp (24hrs), Avg:97.7 F (36.5 C), Min:97.6 F (36.4 C), Max:97.9 F (36.6 C)  Recent Labs  Lab 01/23/24 1428 01/23/24 1429 01/23/24 1549 01/24/24 0448 01/24/24 1759 01/25/24 0528 01/26/24 0509 01/27/24 0417 01/28/24 0033 01/28/24 0547  WBC  --    < >  --  12.6*  --  10.0 7.1 8.7  --  8.9  CREATININE  --    < >  --  2.25* 2.09* 2.09* 2.22* 2.34*  --  2.33*  LATICACIDVEN 1.1  --  1.0  --   --   --   --   --   --   --   VANCOPEAK  --   --   --   --   --   --   --   --  38  --    VANCORANDOM  --   --   --   --  12  --   --   --   --   --    < > = values in this interval not displayed.    Estimated Creatinine Clearance: 44.6 mL/min (A) (by C-G formula based on SCr of 2.33 mg/dL (H)).    Allergies  Allergen Reactions   Oxycodone Itching    Antimicrobials this admission: 3/6 Vancomycin >>  3/6 Cefepime >>    Thank you for allowing pharmacy to be a part of this patient's care.  Juliette Alcide, PharmD, BCPS, BCIDP Work Cell: [phone] 01/28/2024 3:27 PM

## 2024-01-28 NOTE — H&P (View-Only) (Signed)
 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  On workup via MRI the patient was noted to have osteomyelitis of the proximal tibia with an increase in subcutaneous fat edema.  Initial consult vascular surgery recommends BKA to AKA surgical procedure due to the osteomyelitis.  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   Vitals:   01/28/24 0511 01/28/24 0756  BP: 113/77 (!) 169/88  Pulse: (!) 56 69  Resp: 18 18  Temp: 97.9 F (36.6 C) 97.6 F (36.4 C)  SpO2: 100% 100%   Physical Exam: Cardiac:  RRR, normal S1-S2 Lungs: Lear on auscultation throughout.  Normal labored breathing. Incisions: None Extremities: Left lower extremity BKA with cellulitis and osteomyelitis.  Stump reddened due to cellulitis.  Drainage or infection noted. Abdomen: Positive bowel sounds throughout, soft, nontender nondistended. Neurologic: And oriented x 4, answers all questions and follows commands appropriately.  CBC    Component Value Date/Time   WBC 8.9 01/28/2024 0547   RBC 3.39 (L) 01/28/2024 0547   HGB 9.0 (L) 01/28/2024 0547   HCT 26.6 (L) 01/28/2024 0547   PLT 292 01/28/2024 0547   MCV 78.5 (L) 01/28/2024 0547   MCH 26.5 01/28/2024 0547   MCHC 33.8 01/28/2024 0547   RDW 15.9 (H) 01/28/2024 0547   LYMPHSABS 1.0 01/23/2024 1429   MONOABS 0.7 01/23/2024 1429   EOSABS 0.0 01/23/2024 1429   BASOSABS 0.0 01/23/2024 1429    BMET    Component Value Date/Time   NA 135 01/28/2024 0547   K 3.3 (L) 01/28/2024 0547   CL 109 01/28/2024 0547   CO2 18 (L) 01/28/2024 0547   GLUCOSE 89 01/28/2024 0547   BUN 36 (H) 01/28/2024 0547   CREATININE 2.33 (H) 01/28/2024 0547   CALCIUM 8.4 (L) 01/28/2024 0547    GFRNONAA 34 (L) 01/28/2024 0547   GFRAA >60 05/05/2020 1930    INR    Component Value Date/Time   INR 1.1 01/27/2024 0417     Intake/Output Summary (Last 24 hours) at 01/28/2024 1005 Last data filed at 01/28/2024 0700 Gross per 24 hour  Intake 400 ml  Output 2575 ml  Net -2175 ml     Assessment/Plan:  48 y.o. male who presents to [facility] emergency department with concerns of infection of left lower extremity BKA stump.* No surgery found *   PLAN On MRI of the patient's left lower extremity BKA patient was noted to have continued if not worsening left tibial osteomyelitis.  Is also noted to have soft tissue edema consistent with cellulitis.  Patient was seen back on 01/23/2024 in consultation for this issue.  We recommended BKA to AKA of the left lower extremity at that time.  Patient is now agreeable with having the procedure done.  Vascular surgery plans on taking the patient to the operating room tomorrow on 01/29/2024 for revision of the BKA to AKA of the left lower extremity.  I discussed in detail at the bedside with the patient, his mother and a family member the surgical procedure, benefits, risk, and complications.  All verbalized her understanding and wished to proceed.  Answered all their questions this morning.  Patient will be made n.p.o. after midnight for surgical procedure tomorrow.  I  discussed the case in detail with Dr. Festus Barren MD and he is in agreement with the plan.   Marcie [facility] 01/28/2024 10:05 AM

## 2024-01-29 ENCOUNTER — Other Ambulatory Visit: Payer: Self-pay

## 2024-01-29 ENCOUNTER — Encounter
Admission: EM | Disposition: A | Discharge status: Skilled Nursing Facility | Payer: Self-pay | Source: Home / Self Care | Attending: Student

## 2024-01-29 ENCOUNTER — Inpatient Hospital Stay: Admitting: Certified Registered"

## 2024-01-29 ENCOUNTER — Encounter: Payer: Self-pay | Admitting: Internal Medicine

## 2024-01-29 DIAGNOSIS — Z89512 Acquired absence of left leg below knee: Secondary | ICD-10-CM | POA: Diagnosis not present

## 2024-01-29 DIAGNOSIS — M869 Osteomyelitis, unspecified: Secondary | ICD-10-CM | POA: Diagnosis not present

## 2024-01-29 DIAGNOSIS — M86662 Other chronic osteomyelitis, left tibia and fibula: Secondary | ICD-10-CM | POA: Diagnosis not present

## 2024-01-29 DIAGNOSIS — L03116 Cellulitis of left lower limb: Secondary | ICD-10-CM | POA: Diagnosis not present

## 2024-01-29 DIAGNOSIS — Z9889 Other specified postprocedural states: Secondary | ICD-10-CM | POA: Diagnosis not present

## 2024-01-29 DIAGNOSIS — E1169 Type 2 diabetes mellitus with other specified complication: Secondary | ICD-10-CM | POA: Diagnosis not present

## 2024-01-29 HISTORY — PX: AMPUTATION: SHX166

## 2024-01-29 LAB — GLUCOSE, CAPILLARY
Glucose-Capillary: 74 mg/dL (ref 70–99)
Glucose-Capillary: 60 mg/dL — ABNORMAL LOW (ref 70–99)
Glucose-Capillary: 79 mg/dL (ref 70–99)
Glucose-Capillary: 99 mg/dL (ref 70–99)
Glucose-Capillary: 235 mg/dL — ABNORMAL HIGH (ref 70–99)

## 2024-01-29 LAB — MAGNESIUM: Magnesium: 2.5 mg/dL — ABNORMAL HIGH (ref 1.7–2.4)

## 2024-01-29 LAB — BASIC METABOLIC PANEL
Anion gap: 8 (ref 5–15)
BUN: 35 mg/dL — ABNORMAL HIGH (ref 6–20)
CO2: 17 mmol/L — ABNORMAL LOW (ref 22–32)
Calcium: 8.5 mg/dL — ABNORMAL LOW (ref 8.9–10.3)
Chloride: 113 mmol/L — ABNORMAL HIGH (ref 98–111)
Creatinine, Ser: 2.06 mg/dL — ABNORMAL HIGH (ref 0.61–1.24)
GFR, Estimated: 39 mL/min — ABNORMAL LOW (ref >60)
Glucose, Bld: 85 mg/dL (ref 70–99)
Potassium: 3.6 mmol/L (ref 3.5–5.1)
Sodium: 138 mmol/L (ref 135–145)

## 2024-01-29 LAB — CBC
HCT: 28.3 % — ABNORMAL LOW (ref 39.0–52.0)
Hemoglobin: 9.5 g/dL — ABNORMAL LOW (ref 13.0–17.0)
MCH: 27.1 pg (ref 26.0–34.0)
MCHC: 33.6 g/dL (ref 30.0–36.0)
MCV: 80.9 fL (ref 80.0–100.0)
Platelets: 296 10*3/uL (ref 150–400)
RBC: 3.5 MIL/uL — ABNORMAL LOW (ref 4.22–5.81)
RDW: 16.2 % — ABNORMAL HIGH (ref 11.5–15.5)
WBC: 8.3 10*3/uL (ref 4.0–10.5)
nRBC: 0 % (ref 0.0–0.2)

## 2024-01-29 LAB — PHOSPHORUS: Phosphorus: 3.7 mg/dL (ref 2.5–4.6)

## 2024-01-29 SURGERY — AMPUTATION, ABOVE KNEE
Anesthesia: General | Site: Knee | Laterality: Left

## 2024-01-29 MED ORDER — METHOCARBAMOL 1000 MG/10ML IJ SOLN: 500.0000 mg | Freq: Three times a day (TID) | INTRAMUSCULAR | Status: DC | PRN

## 2024-01-29 MED ORDER — ACETAMINOPHEN 10 MG/ML IV SOLN
INTRAVENOUS | Status: DC | PRN
  Administered 2024-01-29: 1000 mg via INTRAVENOUS

## 2024-01-29 MED ORDER — GLYCOPYRROLATE 0.2 MG/ML IJ SOLN
INTRAMUSCULAR | Status: DC | PRN
Start: 2024-01-29 — End: 2024-01-29
  Administered 2024-01-29: .2 mg via INTRAVENOUS

## 2024-01-29 MED ORDER — AMLODIPINE BESYLATE 5 MG PO TABS
5.0000 mg | ORAL_TABLET | Freq: Every evening | ORAL | Status: DC
  Administered 2024-01-29 – 2024-02-03 (×6): 5 mg via ORAL
  Filled 2024-01-29 (×6): qty 1

## 2024-01-29 MED ORDER — LIDOCAINE HCL (PF) 2 % IJ SOLN
INTRAMUSCULAR | Status: AC
  Filled 2024-01-29: qty 5

## 2024-01-29 MED ORDER — METHOCARBAMOL 500 MG PO TABS
500.0000 mg | ORAL_TABLET | Freq: Three times a day (TID) | ORAL | Status: DC | PRN
  Administered 2024-01-29 – 2024-02-05 (×14): 500 mg via ORAL
  Filled 2024-01-29 (×14): qty 1

## 2024-01-29 MED ORDER — POLYSACCHARIDE IRON COMPLEX 150 MG PO CAPS
150.0000 mg | ORAL_CAPSULE | Freq: Every day | ORAL | Status: DC
  Administered 2024-01-30 – 2024-02-05 (×7): 150 mg via ORAL
  Filled 2024-01-29 (×8): qty 1

## 2024-01-29 MED ORDER — HYDROMORPHONE HCL 1 MG/ML IJ SOLN
INTRAMUSCULAR | Status: DC | PRN
  Administered 2024-01-29 (×2): .5 mg via INTRAVENOUS

## 2024-01-29 MED ORDER — EPHEDRINE 5 MG/ML INJ
INTRAVENOUS | Status: AC
  Filled 2024-01-29: qty 5

## 2024-01-29 MED ORDER — MIDAZOLAM HCL 2 MG/2ML IJ SOLN
INTRAMUSCULAR | Status: AC
  Filled 2024-01-29: qty 2

## 2024-01-29 MED ORDER — HYDROCODONE-ACETAMINOPHEN 5-325 MG PO TABS
1.0000 | ORAL_TABLET | Freq: Four times a day (QID) | ORAL | Status: DC | PRN
  Administered 2024-01-30 – 2024-01-31 (×2): 2 via ORAL
  Filled 2024-01-29 (×3): qty 2

## 2024-01-29 MED ORDER — HYDROMORPHONE HCL 1 MG/ML IJ SOLN
0.5000 mg | INTRAMUSCULAR | Status: DC | PRN
  Administered 2024-01-29 (×2): 0.5 mg via INTRAVENOUS

## 2024-01-29 MED ORDER — FENTANYL CITRATE (PF) 100 MCG/2ML IJ SOLN
INTRAMUSCULAR | Status: DC | PRN
  Administered 2024-01-29: 75 ug via INTRAVENOUS
  Administered 2024-01-29: 25 ug via INTRAVENOUS

## 2024-01-29 MED ORDER — INSULIN GLARGINE 100 UNIT/ML ~~LOC~~ SOLN
35.0000 [IU] | Freq: Every day | SUBCUTANEOUS | Status: DC
  Administered 2024-01-30: 35 [IU] via SUBCUTANEOUS
  Filled 2024-01-29 (×2): qty 0.35

## 2024-01-29 MED ORDER — GLYCOPYRROLATE 0.2 MG/ML IJ SOLN
INTRAMUSCULAR | Status: AC
  Filled 2024-01-29: qty 1

## 2024-01-29 MED ORDER — FENTANYL CITRATE (PF) 100 MCG/2ML IJ SOLN
INTRAMUSCULAR | Status: AC
  Filled 2024-01-29: qty 2

## 2024-01-29 MED ORDER — PHENYLEPHRINE 80 MCG/ML (10ML) SYRINGE FOR IV PUSH (FOR BLOOD PRESSURE SUPPORT)
PREFILLED_SYRINGE | INTRAVENOUS | Status: AC
  Filled 2024-01-29: qty 10

## 2024-01-29 MED ORDER — ACETAMINOPHEN 10 MG/ML IV SOLN
INTRAVENOUS | Status: AC
  Filled 2024-01-29: qty 100

## 2024-01-29 MED ORDER — KETAMINE HCL 50 MG/5ML IJ SOSY
PREFILLED_SYRINGE | INTRAMUSCULAR | Status: DC | PRN
Start: 2024-01-29 — End: 2024-01-29
  Administered 2024-01-29: 30 mg via INTRAVENOUS

## 2024-01-29 MED ORDER — EPHEDRINE SULFATE-NACL 50-0.9 MG/10ML-% IV SOSY
PREFILLED_SYRINGE | INTRAVENOUS | Status: DC | PRN
Start: 2024-01-29 — End: 2024-01-29
  Administered 2024-01-29: 10 mg via INTRAVENOUS

## 2024-01-29 MED ORDER — PROPOFOL 10 MG/ML IV BOLUS
INTRAVENOUS | Status: AC
  Filled 2024-01-29: qty 20

## 2024-01-29 MED ORDER — HYDROMORPHONE HCL 1 MG/ML IJ SOLN
1.0000 mg | INTRAMUSCULAR | Status: DC | PRN
  Administered 2024-01-29 – 2024-02-02 (×19): 1 mg via INTRAVENOUS
  Filled 2024-01-29 (×20): qty 1

## 2024-01-29 MED ORDER — 0.9 % SODIUM CHLORIDE (POUR BTL) OPTIME
TOPICAL | Status: DC | PRN
  Administered 2024-01-29: 1000 mL

## 2024-01-29 MED ORDER — CEFAZOLIN SODIUM-DEXTROSE 2-4 GM/100ML-% IV SOLN
INTRAVENOUS | Status: AC
  Filled 2024-01-29: qty 100

## 2024-01-29 MED ORDER — HYDROMORPHONE HCL 1 MG/ML IJ SOLN
INTRAMUSCULAR | Status: AC
  Filled 2024-01-29: qty 1

## 2024-01-29 MED ORDER — ONDANSETRON HCL 4 MG/2ML IJ SOLN
INTRAMUSCULAR | Status: AC
  Filled 2024-01-29: qty 2

## 2024-01-29 MED ORDER — MIDAZOLAM HCL 2 MG/2ML IJ SOLN
INTRAMUSCULAR | Status: DC | PRN
Start: 2024-01-29 — End: 2024-01-29
  Administered 2024-01-29: 2 mg via INTRAVENOUS

## 2024-01-29 MED ORDER — HYDROMORPHONE HCL 1 MG/ML IJ SOLN
1.0000 mg | Freq: Once | INTRAMUSCULAR | Status: AC
  Administered 2024-02-01: 1 mg via INTRAVENOUS
  Filled 2024-01-29: qty 1

## 2024-01-29 MED ORDER — ONDANSETRON HCL 4 MG/2ML IJ SOLN
INTRAMUSCULAR | Status: DC | PRN
  Administered 2024-01-29: 4 mg via INTRAVENOUS

## 2024-01-29 MED ORDER — PROPOFOL 10 MG/ML IV BOLUS
INTRAVENOUS | Status: DC | PRN
  Administered 2024-01-29: 120 mg via INTRAVENOUS

## 2024-01-29 MED ORDER — LIDOCAINE HCL (CARDIAC) PF 100 MG/5ML IV SOSY
PREFILLED_SYRINGE | INTRAVENOUS | Status: DC | PRN
Start: 2024-01-29 — End: 2024-01-29
  Administered 2024-01-29: 60 mg via INTRAVENOUS

## 2024-01-29 MED ORDER — DEXAMETHASONE SODIUM PHOSPHATE 10 MG/ML IJ SOLN
INTRAMUSCULAR | Status: AC
  Filled 2024-01-29: qty 1

## 2024-01-29 MED ORDER — DEXAMETHASONE SODIUM PHOSPHATE 10 MG/ML IJ SOLN
INTRAMUSCULAR | Status: DC | PRN
Start: 2024-01-29 — End: 2024-01-29
  Administered 2024-01-29: 5 mg via INTRAVENOUS

## 2024-01-29 MED ORDER — PHENYLEPHRINE 80 MCG/ML (10ML) SYRINGE FOR IV PUSH (FOR BLOOD PRESSURE SUPPORT)
PREFILLED_SYRINGE | INTRAVENOUS | Status: DC | PRN
Start: 2024-01-29 — End: 2024-01-29
  Administered 2024-01-29: 80 ug via INTRAVENOUS

## 2024-01-29 MED ORDER — GABAPENTIN 100 MG PO CAPS
200.0000 mg | ORAL_CAPSULE | Freq: Three times a day (TID) | ORAL | Status: DC
  Administered 2024-01-29 – 2024-02-04 (×18): 200 mg via ORAL
  Filled 2024-01-29 (×18): qty 2

## 2024-01-29 MED ORDER — FENTANYL CITRATE (PF) 100 MCG/2ML IJ SOLN
25.0000 ug | INTRAMUSCULAR | Status: DC | PRN
  Administered 2024-01-29 (×2): 50 ug via INTRAVENOUS

## 2024-01-29 MED ORDER — FENOFIBRATE 54 MG PO TABS
54.0000 mg | ORAL_TABLET | Freq: Every day | ORAL | Status: DC
  Administered 2024-01-30 – 2024-02-05 (×7): 54 mg via ORAL
  Filled 2024-01-29 (×7): qty 1

## 2024-01-29 MED ORDER — KETAMINE HCL 50 MG/5ML IJ SOSY
PREFILLED_SYRINGE | INTRAMUSCULAR | Status: AC
  Filled 2024-01-29: qty 5

## 2024-01-29 SURGICAL SUPPLY — 34 items
BLADE SAGITTAL WIDE XTHICK NO (BLADE) ×1 IMPLANT
BNDG COHESIVE 4X5 TAN STRL LF (GAUZE/BANDAGES/DRESSINGS) ×1 IMPLANT
BNDG ELASTIC 6X5.8 VLCR NS LF (GAUZE/BANDAGES/DRESSINGS) ×1 IMPLANT
BNDG GAUZE DERMACEA FLUFF 4 (GAUZE/BANDAGES/DRESSINGS) ×2 IMPLANT
BRUSH SCRUB EZ 4% CHG (MISCELLANEOUS) ×1 IMPLANT
CHLORAPREP W/TINT 26 (MISCELLANEOUS) ×1 IMPLANT
CLEANSER WND VASHE 34 (WOUND CARE) IMPLANT
DRAPE INCISE IOBAN 66X45 STRL (DRAPES) ×1 IMPLANT
DRAPE INCISE IOBAN 66X60 STRL (DRAPES) ×1 IMPLANT
ELECT CAUTERY BLADE 6.4 (BLADE) ×1 IMPLANT
ELECT REM PT RETURN 9FT ADLT (ELECTROSURGICAL) ×1 IMPLANT
ELECTRODE REM PT RTRN 9FT ADLT (ELECTROSURGICAL) ×1 IMPLANT
GAUZE XEROFORM 1X8 LF (GAUZE/BANDAGES/DRESSINGS) ×2 IMPLANT
GLOVE BIO SURGEON STRL SZ7 (GLOVE) ×2 IMPLANT
GOWN STRL REUS W/ TWL LRG LVL3 (GOWN DISPOSABLE) ×2 IMPLANT
GOWN STRL REUS W/TWL 2XL LVL3 (GOWN DISPOSABLE) ×2 IMPLANT
HANDLE YANKAUER SUCT BULB TIP (MISCELLANEOUS) ×1 IMPLANT
KIT TURNOVER KIT A (KITS) ×1 IMPLANT
LABEL OR SOLS (LABEL) ×1 IMPLANT
MANIFOLD NEPTUNE II (INSTRUMENTS) ×1 IMPLANT
MAT ABSORB FLUID 56X50 GRAY (MISCELLANEOUS) ×1 IMPLANT
NS IRRIG 1000ML POUR BTL (IV SOLUTION) ×1 IMPLANT
PACK EXTREMITY ARMC (MISCELLANEOUS) ×1 IMPLANT
PAD ABD DERMACEA PRESS 5X9 (GAUZE/BANDAGES/DRESSINGS) ×2 IMPLANT
PAD PREP OB/GYN DISP 24X41 (PERSONAL CARE ITEMS) ×1 IMPLANT
SPONGE T-LAP 18X18 ~~LOC~~+RFID (SPONGE) ×2 IMPLANT
STAPLER SKIN PROX 35W (STAPLE) ×1 IMPLANT
STOCKINETTE M/LG 89821 (MISCELLANEOUS) ×1 IMPLANT
SUT SILK 2 0 SH (SUTURE) ×2 IMPLANT
SUT SILK 2-0 18XBRD TIE 12 (SUTURE) ×1 IMPLANT
SUT VIC AB 0 CT1 36 (SUTURE) ×2 IMPLANT
SUT VIC AB 2-0 CT1 (SUTURE) ×2 IMPLANT
TRAP FLUID SMOKE EVACUATOR (MISCELLANEOUS) ×1 IMPLANT
WATER STERILE IRR 500ML POUR (IV SOLUTION) ×1 IMPLANT

## 2024-01-29 NOTE — Anesthesia Postprocedure Evaluation (Signed)
 Anesthesia Post Note  Patient: Richard Mendoza  Procedure(s) Performed: AMPUTATION, ABOVE KNEE (Left: Knee)  Patient location during evaluation: PACU Anesthesia Type: General Level of consciousness: awake and alert Pain management: pain level controlled Vital Signs Assessment: post-procedure vital signs reviewed and stable Respiratory status: spontaneous breathing, nonlabored ventilation, respiratory function stable and patient connected to nasal cannula oxygen Cardiovascular status: blood pressure returned to baseline and stable Postop Assessment: no apparent nausea or vomiting Anesthetic complications: no  No notable events documented.   Last Vitals:  Vitals:   01/29/24 1500 01/29/24 1506  BP: (!) 155/85   Pulse: 92 95  Resp: 12 18  Temp:    SpO2: 95% 94%    Last Pain:  Vitals:   01/29/24 1506  TempSrc:   PainSc: 7                  Stephanie Coup

## 2024-01-29 NOTE — Progress Notes (Signed)
 Progress Note   Patient: Richard Mendoza YNW:[id number] DOB: 05-02-1976 DOA: 01/23/2024     6 DOS: the patient was seen and examined on 01/29/2024   Brief hospital course: Richard Mendoza is a 48 year old male with history of insulin-dependent diabetes mellitus on insulin pump, GERD, hypertension, who presents emergency department for chief concerns of wound check in the left leg at site of amputation.  Assessment and Plan:  Left tibial osteomyelitis and stump abscess Presented with worsening erythema and edema and ill fitting prosthesis.   S/p Multiple previous hospitalizations for stump infection, most recent in October 2020 for, treated conservatively with antibiotics.  Previous recommendations by vascular surgery patient made require conversion from BKA to AKA.  Patient however is resistant against this recommendation.  When compared against last presentation, repeat MRI showing interval increase in subcutaneous fat edema and swelling as well as new abscesses in the anterior aspect. continued chronic appearing proximal tibial osteomyelitis.   Patient was sent by vascular surgery for admission, they are aware, when patient will agree for AKA then please consult vascular surgery Continues on empiric cefepime, Flagyl, vancomycin for the time being.   Monitor edema and erythema.   Will recheck CBC in AM.  Likely that patient will need to pursue AKA. 3/10 d/w patient to make a final decision to go home with IV antibiotics versus above-knee amputation so that we can call appropriate consult ID versus vascular surgery and plan accordingly. 3/11 patient agreed for appointment amputation.  Informed vascular surgery, we will keep n.p.o. for amputation tomorrow a.m. 3/12 s/p left AKA, seen after the procedure c/o severe pain Started Gabapentin 200 po TID and Robaxin prn and Dilaudid prn    Severe hypokalemia, resolved Potassium repleted. Monitor electrolytes daily.  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  Likely prerenal etiology exacerbation from uncontrolled diabetes.   Creatinine 2.34 Continue to monitor renal functions Continue oral hydration Repeat BMP tomorrow a.m.  Metabolic acidosis due to renal failure Continue oral bicarbonate. Repeat BMP daily   Uncontrolled insulin-dependent diabetes mellitus - Exacerbated by infection.  Glucose greater than 600 on presentation.  Responded well to insulin coverage.  A1c 13.6 suggesting very poor control of her complaints prior to admission.  Still not well-controlled while inpatient.  Will increase insulin glargine from 30 units daily to 35 units daily.  Continue insulin sliding scale.  Tobacco use -Counseled on cessation.  Nicotine patch as needed  Major depressive disorder, recurrent, in remission (HCC) -Continue trazodone 100 mg nightly  Vitamin D deficiency: started vitamin D 50,000 units p.o. weekly, follow with PCP to repeat vitamin D level after 3 to 6 months.  Anemia due to mild iron deficiency and CKD Folic acid and B12 within normal range Transferrin saturation 20% wnl but iron level is 42 slightly low Started oral iron supplement with vitamin C   DVT prophylaxis: Lovenox [place]   Active Pressure Injury/Wound(s)     Pressure Ulcer  Duration          Pressure Injury 04/30/23 Knee Left;Medial Red erythema, abrasion to distal L knee 272 days            Subjective: No significant events overnight, patient was seen after left AKA.  Complaining of severe pain after the procedure.  Denied any other complaints   Physical Exam: Vitals:   01/28/24 2132 01/28/24 2132 01/29/24 0430 01/29/24 0846  BP: (!) 152/87 (!) 152/87 (!) 162/92 (!) 169/91  Pulse: (!) 58 (!) 57 70 69  Resp: 18 18 18 19   Temp: 97.6 F (36.4 C) 97.6 F (36.4 C) 97.7 F (36.5 C) (!) 97.5 F (36.4 C)  TempSrc:      SpO2: 100% 99% 99% 100%  Weight:      Height:       GENERAL:   Alert, pleasant, no acute distress  HEENT:  EOMI CARDIOVASCULAR:  RRR, no murmurs appreciated RESPIRATORY:  Clear to auscultation, no wheezing, rales, or rhonchi GASTROINTESTINAL:  Soft, nontender, nondistended EXTREMITIES: s/p Left AKA dressing CDI NEURO:  No new focal deficits appreciated SKIN: no rashes PSYCH:  Appropriate mood and affect   Data Reviewed:  There are no new results to review at this time.  Family Communication: None at bedside   Disposition: Status is: Inpatient Remains inpatient appropriate because: Stump infection requiring IV antibiotics  Planned Discharge Destination: Home with Home Health    Time spent: 35 minutes  Author: Gillis Santa, MD 01/29/2024 10:13 AM  For on call review [email or web address].

## 2024-01-29 NOTE — Transfer of Care (Signed)
 Immediate Anesthesia Transfer of Care Note  Patient: Richard Mendoza  Procedure(s) Performed: AMPUTATION, ABOVE KNEE (Left: Knee)  Patient Location: PACU  Anesthesia Type:General  Level of Consciousness: drowsy  Airway & Oxygen Therapy: Patient Spontanous Breathing and Patient connected to face mask oxygen  Post-op Assessment: Report given to RN and Post -op Vital signs reviewed and stable  Post vital signs: Reviewed and stable  Last Vitals:  Vitals Value Taken Time  BP 116/74 01/29/24 1410  Temp 36.2 C 01/29/24 1410  Pulse 86 01/29/24 1413  Resp 15 01/29/24 1413  SpO2 98 % 01/29/24 1413  Vitals shown include unfiled device data.  Last Pain:  Vitals:   01/29/24 1410  TempSrc:   PainSc: Asleep      Patients Stated Pain Goal: 0 (01/27/24 2148)  Complications: No notable events documented.

## 2024-01-29 NOTE — Plan of Care (Signed)
  Problem: Coping: Goal: Ability to adjust to condition or change in health will improve Outcome: Progressing   Problem: Nutritional: Goal: Maintenance of adequate nutrition will improve Outcome: Progressing   Problem: Skin Integrity: Goal: Risk for impaired skin integrity will decrease Outcome: Progressing   Problem: Tissue Perfusion: Goal: Adequacy of tissue perfusion will improve Outcome: Progressing   Problem: Education: Goal: Knowledge of General Education information will improve Description: Including pain rating scale, medication(s)/side effects and non-pharmacologic comfort measures Outcome: Progressing   Problem: Activity: Goal: Risk for activity intolerance will decrease Outcome: Progressing   Problem: Pain Managment: Goal: General experience of comfort will improve and/or be controlled Outcome: Progressing   Problem: Safety: Goal: Ability to remain free from injury will improve Outcome: Progressing

## 2024-01-29 NOTE — Interval H&P Note (Signed)
 History and Physical Interval Note:  01/29/2024 12:32 PM  Richard Mendoza  has presented today for surgery, with the diagnosis of Osteomyelitis.  The various methods of treatment have been discussed with the patient and family. After consideration of risks, benefits and other options for treatment, the patient has consented to  Procedure(s): AMPUTATION, ABOVE KNEE (Left) as a surgical intervention.  The patient's history has been reviewed, patient examined, no change in status, stable for surgery.  I have reviewed the patient's chart and labs.  Questions were answered to the patient's satisfaction.     Festus Barren

## 2024-01-29 NOTE — Inpatient Diabetes Management (Addendum)
 Inpatient Diabetes Program Recommendations  AACE/ADA: New Consensus Statement on Inpatient Glycemic Control (2015)  Target Ranges:  Prepandial:   less than 140 mg/dL      Peak postprandial:   less than 180 mg/dL (1-2 hours)      Critically ill patients:  140 - 180 mg/dL   Lab Results  Component Value Date   GLUCAP 74 01/29/2024   HGBA1C 13.6 (H) 01/24/2024    Latest Reference Range & Units 01/27/24 08:36 01/27/24 11:40 01/27/24 16:39 01/27/24 21:02 01/28/24 07:57 01/28/24 11:44 01/28/24 16:33 01/28/24 21:33 01/29/24 07:44  Glucose-Capillary 70 - 99 mg/dL 469 (H) 629 (H) 528 (H) 128 (H) 104 (H) 129 (H) 93 88 74  (H): Data is abnormally high  Review of Glycemic Control  History: DM2   Home DM Meds: Medtronic insulin pump Fiasp Insulin Dexcom G6 CGM   Current Orders: Lantus 35 units daily Novolog Sensitive Correction Scale/ SSI (0-9 units) TID AC + HS  Inpatient Diabetes Program Recommendations:   Fasting CBG 74. Patient is NPO for surgery today. Please consider: -Discussed with Dr. Lucianne Muss and Plans to reevaluate CBG post op and determine amount and need of lantus dose for today.  Thank you, Billy Fischer. Tylie Golonka, RN, MSN, CDCES  Diabetes Coordinator Inpatient Glycemic Control Team Team Pager [phone] (8am-5pm) 01/29/2024 10:04 AM

## 2024-01-29 NOTE — Op Note (Signed)
 [facility]   OPERATIVE NOTE   PROCEDURE:  Left above-the-knee amputation  PRE-OPERATIVE DIAGNOSIS: Left tibial osteomyelitis s/p left BKA in the past  POST-OPERATIVE DIAGNOSIS: same as above  SURGEON:  Festus Barren, MD  ASSISTANT(S): Rolla Plate, NP  ANESTHESIA: general  ESTIMATED BLOOD LOSS: 250 cc  FINDING(S): none  SPECIMEN(S):  Left above-the-knee amputation  INDICATIONS:   Richard Mendoza is a 48 y.o. male who presents with continued infectious issues well remote from a left BKA.  MRI is positive for tibial osteomyelitis and really the only way to clear this will be with an above-knee amputation.  The patient is scheduled for a left above-the-knee amputation.  I discussed in depth with the patient the risks, benefits, and alternatives to this procedure.  The patient is aware that the risk of this operation included but are not limited to:  bleeding, infection, myocardial infarction, stroke, death, failure to heal amputation wound, and possible need for more proximal amputation.  The patient is aware of the risks and agrees proceed forward with the procedure. An assistant was present during the procedure to help facilitate the exposure and expedite the procedure.   DESCRIPTION: After full informed written consent was obtained from the patient, the patient was taken to the operating room, and placed supine upon the operating table.  Prior to induction, the patient received IV antibiotics.  The assistant provided retraction and mobilization to help facilitate exposure and expedite the procedure throughout the entire procedure.  This included following suture, using retractors, and optimizing lighting.  The patient was then prepped and draped in the standard fashion for a left above-the-knee amputation.  After obtaining adequate anesthesia, the patient was prepped and draped in the standard fashion for a above-the-knee amputation.  I marked out the anterior and  posterior flaps for a fish-mouth type of amputation.  I made the incisions for these flaps, and then dissected through the subcutaneous tissue, fascia, and muscles circumferentially.  I elevated  the periosteal tissue 4-5 cm more proximal than the anterior skin flap.  I then transected the femur with a power saw at this level.  Then I smoothed out the rough edges of the bone.  At this point, the specimen was passed off the field as the above-the-knee amputation.  At this point, I clamped all visibly bleeding arteries and veins using a combination of suture ligation with silk suture and electrocautery.   Bleeding continued to be controlled with electrocautery and suture ligature.  The stump was washed off with sterile normal saline and no further active bleeding was noted.  I reapproximated the anterior and posterior fascia  with interrupted stitches of 0 Vicryl.  This was completed along the entire length of anterior and posterior fascia until there were no more loose space in the fascial line. The subcutaneous tissue was then approximated with 2-0 vicryl sutures. The skin was then  reapproximated with staples.  The stump was washed off and dried.  The incision was dressed with Xeroform and ABD pads, and  then fluffs were applied.  Kerlix was wrapped around the leg and then gently an ACE wrap was applied.  A large Ioban was then placed over the ACE wrap to secure the dressing. The patient was then awakened and take to the recovery room in stable condition.   COMPLICATIONS: none  CONDITION: stable  Festus Barren  01/29/2024, 1:58 PM   This note was created with Dragon Medical transcription system. Any errors in dictation are  purely unintentional.

## 2024-01-29 NOTE — Anesthesia Preprocedure Evaluation (Signed)
 Anesthesia Evaluation  Patient identified by MRN, date of birth, ID band Patient awake    Reviewed: Allergy & Precautions, NPO status , Patient's Chart, lab work & pertinent test results  History of Anesthesia Complications Negative for: history of anesthetic complications  Airway Mallampati: III  TM Distance: >3 FB Neck ROM: full    Dental no notable dental hx. (+) Dental Advidsory Given, Edentulous Lower, Edentulous Upper   Pulmonary sleep apnea , Current Smoker   Pulmonary exam normal        Cardiovascular hypertension, On Medications + Peripheral Vascular Disease  Normal cardiovascular exam     Neuro/Psych  PSYCHIATRIC DISORDERS  Depression     Neuromuscular disease    GI/Hepatic Neg liver ROS,GERD  Controlled,,  Endo/Other  negative endocrine ROSdiabetes, Poorly Controlled, Type 2, Insulin Dependent    Renal/GU Renal disease     Musculoskeletal   Abdominal   Peds  Hematology  (+) Blood dyscrasia, anemia   Anesthesia Other Findings Past Medical History: 03/17/2009: DIABETES MELLITUS, TYPE II, UNCONTROLLED 12/08/2008: DM 03/17/2009: HYPERLIPIDEMIA 12/08/2008: HYPERTENSION 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  Past Surgical History: 11/07/2020: AMPUTATION; Left     Comment:  Procedure: AMPUTATION LEFT THIRD TOE WITH PARTIAL RAY               RESECTION;  Surgeon: Linus Galas, DPM;  Location: [facility]               ORS;  Service: Podiatry;  Laterality: Left; 11/16/2020: AMPUTATION; Left     Comment:  Procedure: AMPUTATION BELOW KNEE;  Surgeon: Annice Needy, MD;  Location: [facility] ORS;  Service: Vascular;                Laterality: Left; 09/09/2017: ANTERIOR CERVICAL DECOMP/DISCECTOMY FUSION; N/A     Comment:  Procedure: ANTERIOR CERVICAL DECOMPRESSION/DISCECTOMY               FUSION CERVICAL 6- CERVICAL 7;  Surgeon: Coletta Memos,               MD;  Location: [facility] OR;  Service: Neurosurgery;                 Laterality: N/A;  ANTERIOR CERVICAL               DECOMPRESSION/DISCECTOMY FUSION CERVICAL 6- CERVICAL 7 No date: APPENDECTOMY 10/03/2017: I & D EXTREMITY; Right     Comment:  Procedure: IRRIGATION AND DEBRIDEMENT RIGHT WRIST;                Surgeon: Betha Loa, MD;  Location: [facility] OR;  Service:               Orthopedics;  Laterality: Right; 11/26/2018: I & D EXTREMITY; Right     Comment:  Procedure: IRRIGATION AND DEBRIDEMENT FASCIA ON RIGHT               FOOT;  Surgeon: Gwyneth Revels, DPM;  Location: [facility] ORS;              Service: Podiatry;  Laterality: Right; 03/06/2019: INCISION AND DRAINAGE; Right     Comment:  Procedure: INCISION AND DRAINAGE RIGHT FOOT, WITH 4th               RAY AMPUTATION;  Surgeon: Gwyneth Revels, DPM;  Location:              [facility] ORS;  Service: Podiatry;  Laterality: Right; 11/07/2020:  INCISION AND DRAINAGE; Left     Comment:  Procedure: INCISION AND DRAINAGE;  Surgeon: Linus Galas,              DPM;  Location: [facility] ORS;  Service: Podiatry;                Laterality: Left; 05/02/2023: INCISION AND DRAINAGE ABSCESS; Left     Comment:  Procedure: INCISION AND DRAINAGE ABSCESS LEFT LOWER               EXTREMITY;  Surgeon: Annice Needy, MD;  Location: [facility]               ORS;  Service: Vascular;  Laterality: Left; 11/11/2020: IRRIGATION AND DEBRIDEMENT FOOT; Left     Comment:  Procedure: IRRIGATION AND DEBRIDEMENT FOOT;  Surgeon:               Linus Galas, DPM;  Location: [facility] ORS;  Service:               Podiatry;  Laterality: Left; 05/15/2019: METATARSAL HEAD EXCISION; Right     Comment:  Procedure: OSTECTOMY;MET HEAD 5;  Surgeon: Gwyneth Revels, DPM;  Location: [facility] ORS;  Service: Podiatry;                Laterality: Right; No date: osteomylitis No date: ROTATOR CUFF REPAIR; Left  BMI    Body Mass Index: 27.74 kg/m      Reproductive/Obstetrics negative OB ROS                             Anesthesia  Physical Anesthesia Plan  ASA: 3  Anesthesia Plan: General   Post-op Pain Management:    Induction: Intravenous  PONV Risk Score and Plan: 2 and Ondansetron and Dexamethasone  Airway Management Planned: LMA  Additional Equipment:   Intra-op Plan:   Post-operative Plan: Extubation in OR  Informed Consent: I have reviewed the patients History and Physical, chart, labs and discussed the procedure including the risks, benefits and alternatives for the proposed anesthesia with the patient or authorized representative who has indicated his/her understanding and acceptance.     Dental Advisory Given  Plan Discussed with: Anesthesiologist, CRNA and Surgeon  Anesthesia Plan Comments: (Patient consented for risks of anesthesia including but not limited to:  - adverse reactions to medications - damage to eyes, teeth, lips or other oral mucosa - nerve damage due to positioning  - sore throat or hoarseness - Damage to heart, brain, nerves, lungs, other parts of body or loss of life  Patient voiced understanding and assent.)       Anesthesia Quick Evaluation

## 2024-01-29 NOTE — Plan of Care (Signed)
  Problem: Coping: Goal: Ability to adjust to condition or change in health will improve Outcome: Progressing   Problem: Fluid Volume: Goal: Ability to maintain a balanced intake and output will improve Outcome: Progressing   Problem: Nutritional: Goal: Maintenance of adequate nutrition will improve Outcome: Progressing   Problem: Skin Integrity: Goal: Risk for impaired skin integrity will decrease Outcome: Progressing   Problem: Health Behavior/Discharge Planning: Goal: Ability to manage health-related needs will improve Outcome: Progressing   Problem: Nutrition: Goal: Adequate nutrition will be maintained Outcome: Progressing   Problem: Coping: Goal: Level of anxiety will decrease Outcome: Progressing

## 2024-01-30 ENCOUNTER — Inpatient Hospital Stay

## 2024-01-30 ENCOUNTER — Encounter: Payer: Self-pay | Admitting: Vascular Surgery

## 2024-01-30 DIAGNOSIS — L03116 Cellulitis of left lower limb: Secondary | ICD-10-CM | POA: Diagnosis not present

## 2024-01-30 DIAGNOSIS — S78112A Complete traumatic amputation at level between left hip and knee, initial encounter: Secondary | ICD-10-CM | POA: Diagnosis not present

## 2024-01-30 LAB — BASIC METABOLIC PANEL
Anion gap: 8 (ref 5–15)
BUN: 41 mg/dL — ABNORMAL HIGH (ref 6–20)
CO2: 16 mmol/L — ABNORMAL LOW (ref 22–32)
Calcium: 7.8 mg/dL — ABNORMAL LOW (ref 8.9–10.3)
Chloride: 107 mmol/L (ref 98–111)
Creatinine, Ser: 2.39 mg/dL — ABNORMAL HIGH (ref 0.61–1.24)
GFR, Estimated: 33 mL/min — ABNORMAL LOW (ref >60)
Glucose, Bld: 359 mg/dL — ABNORMAL HIGH (ref 70–99)
Potassium: 4.5 mmol/L (ref 3.5–5.1)
Sodium: 131 mmol/L — ABNORMAL LOW (ref 135–145)

## 2024-01-30 LAB — GLUCOSE, CAPILLARY
Glucose-Capillary: 329 mg/dL — ABNORMAL HIGH (ref 70–99)
Glucose-Capillary: 340 mg/dL — ABNORMAL HIGH (ref 70–99)
Glucose-Capillary: 264 mg/dL — ABNORMAL HIGH (ref 70–99)
Glucose-Capillary: 297 mg/dL — ABNORMAL HIGH (ref 70–99)

## 2024-01-30 LAB — HEMOGLOBIN AND HEMATOCRIT, BLOOD
HCT: 23.4 % — ABNORMAL LOW (ref 39.0–52.0)
Hemoglobin: 7.7 g/dL — ABNORMAL LOW (ref 13.0–17.0)

## 2024-01-30 LAB — CBC
HCT: 23.5 % — ABNORMAL LOW (ref 39.0–52.0)
Hemoglobin: 7.7 g/dL — ABNORMAL LOW (ref 13.0–17.0)
MCH: 26.7 pg (ref 26.0–34.0)
MCHC: 32.8 g/dL (ref 30.0–36.0)
MCV: 81.6 fL (ref 80.0–100.0)
Platelets: 309 10*3/uL (ref 150–400)
RBC: 2.88 MIL/uL — ABNORMAL LOW (ref 4.22–5.81)
RDW: 16 % — ABNORMAL HIGH (ref 11.5–15.5)
WBC: 12.1 10*3/uL — ABNORMAL HIGH (ref 4.0–10.5)
nRBC: 0 % (ref 0.0–0.2)

## 2024-01-30 LAB — PROCALCITONIN: Procalcitonin: <0.1 ng/mL

## 2024-01-30 LAB — MAGNESIUM: Magnesium: 2.3 mg/dL (ref 1.7–2.4)

## 2024-01-30 LAB — PHOSPHORUS: Phosphorus: 4.1 mg/dL (ref 2.5–4.6)

## 2024-01-30 NOTE — Progress Notes (Signed)
 Progress Note   Patient: Richard Mendoza ZOX:[id number] DOB: June 17, 1976 DOA: 01/23/2024     7 DOS: the patient was seen and examined on 01/30/2024   Brief hospital course: Richard Mendoza is a 48 year old male with history of insulin-dependent diabetes mellitus on insulin pump, GERD, hypertension, who presents emergency department for chief concerns of wound check in the left leg at site of amputation.  Assessment and Plan:  Left tibial osteomyelitis and stump abscess Presented with worsening erythema and edema and ill fitting prosthesis.   S/p Multiple previous hospitalizations for stump infection, most recent in October 2020 for, treated conservatively with antibiotics.  Previous recommendations by vascular surgery patient made require conversion from BKA to AKA.  Patient however is resistant against this recommendation.  When compared against last presentation, repeat MRI showing interval increase in subcutaneous fat edema and swelling as well as new abscesses in the anterior aspect. continued chronic appearing proximal tibial osteomyelitis.   Patient was sent by vascular surgery for admission, they are aware, when patient will agree for AKA then please consult vascular surgery S/p cefepime, Flagyl, vancomycin, d/c'd Abx on 3/13, procal negative   Monitor edema and erythema.   Will recheck CBC in AM.  Likely that patient will need to pursue AKA. 3/10 d/w patient to make a final decision to go home with IV antibiotics versus above-knee amputation so that we can call appropriate consult ID versus vascular surgery and plan accordingly. 3/11 patient agreed for appointment amputation.  Informed vascular surgery, we will keep n.p.o. for amputation tomorrow a.m. 3/12 s/p left AKA, seen after the procedure c/o severe pain Started Gabapentin 200 po TID and Robaxin prn and Dilaudid prn    Severe hypokalemia, resolved Potassium repleted. Monitor electrolytes daily.  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  Likely prerenal etiology exacerbation from uncontrolled diabetes.   Creatinine 2.34--2.39 Continue to monitor renal functions Continue oral hydration Repeat BMP tomorrow a.m. Renal sonogram, rule out urinary retention  Metabolic acidosis due to renal failure Continue oral bicarbonate. Repeat BMP daily   Uncontrolled insulin-dependent diabetes mellitus - Exacerbated by infection.  Glucose greater than 600 on presentation.  Responded well to insulin coverage.  A1c 13.6 suggesting very poor control of her complaints prior to admission.  Still not well-controlled while inpatient.  Will increase insulin glargine from 30 units daily to 35 units daily.  Continue insulin sliding scale.  Tobacco use -Counseled on cessation.  Nicotine patch as needed  Major depressive disorder, recurrent, in remission (HCC) -Continue trazodone 100 mg nightly  Vitamin D deficiency: started vitamin D 50,000 units p.o. weekly, follow with PCP to repeat vitamin D level after 3 to 6 months.  Anemia due to mild iron deficiency and CKD Folic acid and B12 within normal range Transferrin saturation 20% wnl but iron level is 42 slightly low Started oral iron supplement with vitamin C   DVT prophylaxis: Lovenox [place]   Active Pressure Injury/Wound(s)     Pressure Ulcer  Duration          Pressure Injury 04/30/23 Knee Left;Medial Red erythema, abrasion to distal L knee 272 days            Subjective: No significant events overnight, patient had significant pain s.p left AKA which is gradually getting better with pain medications.  Patient stated he is making urine, denied any complaints.  Patient was informed that we will discontinue antibiotics and start kidney functions are  not back to his baseline.  We will continue to monitor.    Physical Exam: Vitals:   01/30/24 0012 01/30/24 0350 01/30/24 0759 01/30/24 1427  BP: 133/76 112/68 (!)  145/81 124/78  Pulse: 96 83 73 77  Resp: 18 19 18 20   Temp: 98.2 F (36.8 C) 98.3 F (36.8 C) 97.7 F (36.5 C) (!) 97.5 F (36.4 C)  TempSrc: Oral Oral Oral Oral  SpO2: 100% 97% 100% 100%  Weight:      Height:       GENERAL:  Alert, pleasant, no acute distress  HEENT:  EOMI CARDIOVASCULAR:  RRR, no murmurs appreciated RESPIRATORY:  Clear to auscultation, no wheezing, rales, or rhonchi GASTROINTESTINAL:  Soft, nontender, nondistended EXTREMITIES: s/p Left AKA dressing CDI NEURO:  No new focal deficits appreciated SKIN: no rashes PSYCH:  Appropriate mood and affect   Data Reviewed:  There are no new results to review at this time.  Family Communication: None at bedside   Disposition: Status is: Inpatient Remains inpatient appropriate because: Stump infection requiring IV antibiotics  Planned Discharge Destination: Home with Home Health    Time spent: 35 minutes  Author: Gillis Santa, MD 01/30/2024 2:55 PM  For on call review [email or web address].

## 2024-01-30 NOTE — Evaluation (Signed)
 Occupational Therapy Evaluation Patient Details Name: Niko Penson MRN: [id number] DOB: 1975-12-24 Today's Date: 01/30/2024   History of Present Illness   Pt is a 48 y.o. male presenting to hospital 01/23/24 with concerns for L BKA infection.  Pt admitted with LLE stump infection, hyponatremia, hypokalemia, uncontrolled DM with hyperglycemia.  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  PMH includes DM on insulin pump, htn, L BKA 2021, ACDF 2018, L RCR, R shoulder arthroscopy with debridement and bicep tenon repair.    Clinical Impressions Mr Krol was seen for OT evaluation this date. Prior to hospital admission, pt was IND using prosthetic. Pt lives with son in mobile home with STE. Pt currently requires MIN A x2 + RW for ADL t/f ~5 ft forward/backward, increased pain with mobility. SUPERVISION seated LB access. Pt would benefit from skilled OT to address noted impairments and functional limitations (see below for any additional details). Upon hospital discharge, recommend OT follow up.    If plan is discharge home, recommend the following:   A little help with walking and/or transfers;A little help with bathing/dressing/bathroom     Functional Status Assessment   Patient has had a recent decline in their functional status and demonstrates the ability to make significant improvements in function in a reasonable and predictable amount of time.     Equipment Recommendations   Tub/shower seat     Recommendations for Other Services         Precautions/Restrictions   Precautions Precautions: Fall Recall of Precautions/Restrictions: Intact Restrictions Weight Bearing Restrictions Per Provider Order: No     Mobility Bed Mobility Overal bed mobility: Modified Independent                  Transfers Overall transfer level: Needs assistance Equipment used: Rolling walker (2 wheels) Transfers: Sit to/from Stand, Bed to  chair/wheelchair/BSC Sit to Stand: Contact guard assist     Step pivot transfers: Min assist, +2 physical assistance            Balance Overall balance assessment: Needs assistance Sitting-balance support: No upper extremity supported, Feet supported Sitting balance-Leahy Scale: Good     Standing balance support: Bilateral upper extremity supported Standing balance-Leahy Scale: Poor                             ADL either performed or assessed with clinical judgement   ADL Overall ADL's : Needs assistance/impaired                                       General ADL Comments: MIN A x2 + RW for ADL t/f ~5 ft forward/backward.                  Pertinent Vitals/Pain Pain Assessment Pain Assessment: 0-10 Pain Score: 8  Pain Location: L residual limb Pain Descriptors / Indicators: Sharp, Shooting Pain Intervention(s): Limited activity within patient's tolerance, Premedicated before session     Extremity/Trunk Assessment Upper Extremity Assessment Upper Extremity Assessment: Overall WFL for tasks assessed   Lower Extremity Assessment Lower Extremity Assessment: Generalized weakness;LLE deficits/detail LLE Deficits / Details: at least 3/5 AROM hip flexion/aBd/aDduction; L hip appearing to in flexed position (anticipate impaired L hip extension)   Cervical / Trunk Assessment Cervical / Trunk Assessment: Normal   Communication Communication Communication: No apparent  difficulties   Cognition Arousal: Alert Behavior During Therapy: WFL for tasks assessed/performed Cognition: No apparent impairments             OT - Cognition Comments: poor safety awareness                 Following commands: Intact                  Home Living Family/patient expects to be discharged to:: Private residence Living Arrangements: Children (Pt's son) Available Help at Discharge: Family;Available PRN/intermittently Type of Home: Mobile  home Home Access: Stairs to enter Entrance Stairs-Number of Steps: 5 Entrance Stairs-Rails: Right;Left (per pt's mother the railings are not safe/secure) Home Layout: One level               Home Equipment: Agricultural consultant (2 wheels);Wheelchair - manual;Cane - single point;BSC/3in1          Prior Functioning/Environment Prior Level of Function : Independent/Modified Independent             Mobility Comments: Independent with prosthetic or modified independent using RW      OT Problem List: Decreased strength;Decreased range of motion;Decreased activity tolerance;Impaired balance (sitting and/or standing)   OT Treatment/Interventions: Self-care/ADL training;Therapeutic exercise;Energy conservation;DME and/or AE instruction;Therapeutic activities      OT Goals(Current goals can be found in the care plan section)   Acute Rehab OT Goals Patient Stated Goal: to go to rehab OT Goal Formulation: With patient/family Time For Goal Achievement: 02/13/24 Potential to Achieve Goals: Good ADL Goals Pt Will Perform Grooming: with contact guard assist;standing Pt Will Perform Lower Body Dressing: with modified independence;sit to/from stand Pt Will Transfer to Toilet: with modified independence;regular height toilet   OT Frequency:  Min 2X/week    Co-evaluation PT/OT/SLP Co-Evaluation/Treatment: Yes Reason for Co-Treatment: Necessary to address cognition/behavior during functional activity;To address functional/ADL transfers PT goals addressed during session: Mobility/safety with mobility OT goals addressed during session: ADL's and self-care      AM-PAC OT "6 Clicks" Daily Activity     Outcome Measure Help from another person eating meals?: None Help from another person taking care of personal grooming?: A Little Help from another person toileting, which includes using toliet, bedpan, or urinal?: A Little Help from another person bathing (including washing, rinsing,  drying)?: A Lot Help from another person to put on and taking off regular upper body clothing?: A Little Help from another person to put on and taking off regular lower body clothing?: A Little 6 Click Score: 18   End of Session Equipment Utilized During Treatment: Rolling walker (2 wheels);Gait belt Nurse Communication: Mobility status  Activity Tolerance: Patient tolerated treatment well Patient left: in bed;with call bell/phone within reach;with family/visitor present  OT Visit Diagnosis: Other abnormalities of gait and mobility (R26.89)                Time: 4098-1191 OT Time Calculation (min): 12 min Charges:  OT General Charges $OT Visit: 1 Visit OT Evaluation $OT Eval Low Complexity: 1 Low  Kathie Dike, M.S. OTR/L  01/30/24, 2:07 PM  ascom [phone]

## 2024-01-30 NOTE — Progress Notes (Signed)
  Progress Note    01/30/2024 3:34 PM 1 Day Post-Op  Subjective: Richard Mendoza is a 48 year old male now postop day 1 from a left above-the-knee amputation.  Patient was originally admitted for left below the knee amputation infection.  After workup patient was noted to have considerable osteomyelitis in his tibial bone and therefore required an AKA.  This morning on exam patient was resting comfortably in bed.  No distress noted.  Patient does endorse some pain but says it is tolerable at this time.  No other complaints overnight.  Dressings clean dry and intact.  Vitals are remained stable.   Vitals:   01/30/24 0759 01/30/24 1427  BP: (!) 145/81 124/78  Pulse: 73 77  Resp: 18 20  Temp: 97.7 F (36.5 C) (!) 97.5 F (36.4 C)  SpO2: 100% 100%   Physical Exam: Cardiac:  RRR, normal S1 and S2.  No murmurs present. Lungs: Lungs clear on auscultation.  No rales rhonchi or wheezing. Incisions: Left AKA incision with dressing clean dry and intact.  No noted drainage. Extremities: Right lower extremity warm to touch with palpable pulses.  Left lower extremity with new AKA.  Dressings clean dry and intact. Abdomen: Positive bowel sounds throughout, soft, nontender and nondistended. Neurologic: Alert and oriented x 3, answers all questions and follows commands appropriately.  CBC    Component Value Date/Time   WBC 12.1 (H) 01/30/2024 0428   RBC 2.88 (L) 01/30/2024 0428   HGB 7.7 (L) 01/30/2024 0428   HCT 23.5 (L) 01/30/2024 0428   PLT 309 01/30/2024 0428   MCV 81.6 01/30/2024 0428   MCH 26.7 01/30/2024 0428   MCHC 32.8 01/30/2024 0428   RDW 16.0 (H) 01/30/2024 0428   LYMPHSABS 1.0 01/23/2024 1429   MONOABS 0.7 01/23/2024 1429   EOSABS 0.0 01/23/2024 1429   BASOSABS 0.0 01/23/2024 1429    BMET    Component Value Date/Time   NA 131 (L) 01/30/2024 0428   K 4.5 01/30/2024 0428   CL 107 01/30/2024 0428   CO2 16 (L) 01/30/2024 0428   GLUCOSE 359 (H) 01/30/2024 0428   BUN  41 (H) 01/30/2024 0428   CREATININE 2.39 (H) 01/30/2024 0428   CALCIUM 7.8 (L) 01/30/2024 0428   GFRNONAA 33 (L) 01/30/2024 0428   GFRAA >60 05/05/2020 1930    INR    Component Value Date/Time   INR 1.1 01/27/2024 0417     Intake/Output Summary (Last 24 hours) at 01/30/2024 1534 Last data filed at 01/30/2024 1428 Gross per 24 hour  Intake 300 ml  Output 1225 ml  Net -925 ml     Assessment/Plan:  48 y.o. male is s/p left revision of BKA to AKA 1 Day Post-Op   PLAN Pain medication as needed. OOB to chair today. Advance diet as tolerated. Continue to work with PT/OT. First dressing change by vascular surgery over the weekend. Patient expresses he would like to go to rehab on discharge.  DVT prophylaxis: Lovenox 40 mg subcu every 24   Jayvon Mounger R Tajanay [facility]ley [facility] 01/30/2024 3:34 PM

## 2024-01-30 NOTE — Evaluation (Signed)
 Physical Therapy Evaluation Patient Details Name: Richard Mendoza MRN: [id number] DOB: 03/22/1976 Today's Date: 01/30/2024  History of Present Illness  Pt is a 48 y.o. male presenting to hospital 01/23/24 with concerns for L BKA infection.  Pt admitted with LLE stump infection, hyponatremia, hypokalemia, uncontrolled DM with hyperglycemia.  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  PMH includes DM on insulin pump, htn, L BKA 2021, ACDF 2018, L RCR, R shoulder arthroscopy with debridement and bicep tenon repair.  Clinical Impression  PT/OT co-evaluation performed.  Prior to recent medical concerns, pt reports being ambulatory (independent with prosthesis; modified independent with RW without prosthesis); lives with his son in 1 level home with 5 STE B railings.  L AKA pain 5-6/10 at rest beginning of session; 10/10 with activity; and 8/10 at rest end of session (pt pre-medicated with pain medication).  Currently pt is modified independent with bed mobility; CGA with transfer from bed using RW; and min assist x2 to ambulate 10 feet with RW use.  Pt demonstrating impaired balance with activities.  Pt would currently benefit from skilled PT to address noted impairments and functional limitations (see below for any additional details).  Upon hospital discharge, pt would benefit from ongoing therapy.     If plan is discharge home, recommend the following: A lot of help with walking and/or transfers;A little help with bathing/dressing/bathroom;Assistance with cooking/housework;Assist for transportation;Help with stairs or ramp for entrance   Can travel by private vehicle        Equipment Recommendations Other (comment) (shower chair)  Recommendations for Other Services       Functional Status Assessment Patient has had a recent decline in their functional status and demonstrates the ability to make significant improvements in function in a reasonable and predictable amount of  time.     Precautions / Restrictions Precautions Precautions: Fall Recall of Precautions/Restrictions: Intact Restrictions Weight Bearing Restrictions Per Provider Order: Yes LLE Weight Bearing Per Provider Order: Non weight bearing Other Position/Activity Restrictions: s/p L AKA      Mobility  Bed Mobility Overal bed mobility: Modified Independent             General bed mobility comments: Semi-supine to/from sitting    Transfers Overall transfer level: Needs assistance Equipment used: Rolling walker (2 wheels) Transfers: Sit to/from Stand Sit to Stand: Contact guard assist                Ambulation/Gait Ambulation/Gait assistance: Min assist, +2 physical assistance Gait Distance (Feet): 10 Feet Assistive device: Rolling walker (2 wheels)   Gait velocity: decreased     General Gait Details: assist to steady; "hop to" gait pattern  Stairs            Wheelchair Mobility     Tilt Bed    Modified Rankin (Stroke Patients Only)       Balance Overall balance assessment: Needs assistance Sitting-balance support: No upper extremity supported, Feet supported Sitting balance-Leahy Scale: Good Sitting balance - Comments: steady reaching within BOS   Standing balance support: Bilateral upper extremity supported, During functional activity, Reliant on assistive device for balance Standing balance-Leahy Scale: Poor Standing balance comment: assist to steady with dynamic standing activities                             Pertinent Vitals/Pain Pain Assessment Pain Assessment: 0-10 Pain Score: 8  Pain Location: L LE residual  limb Pain Descriptors / Indicators: Sharp, Shooting Pain Intervention(s): Limited activity within patient's tolerance, Monitored during session, Premedicated before session, Repositioned    Home Living Family/patient expects to be discharged to:: Private residence Living Arrangements: Children (Pt's son) Available  Help at Discharge: Family;Available PRN/intermittently Type of Home: Mobile home Home Access: Stairs to enter Entrance Stairs-Rails: Right;Left (per pt's mother the railings are not safe/secure) Entrance Stairs-Number of Steps: 5   Home Layout: One level Home Equipment: Agricultural consultant (2 wheels);Wheelchair - manual;Cane - single point;BSC/3in1      Prior Function Prior Level of Function : Independent/Modified Independent             Mobility Comments: Independent with prosthetic or modified independent using RW       Extremity/Trunk Assessment   Upper Extremity Assessment Upper Extremity Assessment: Overall WFL for tasks assessed    Lower Extremity Assessment Lower Extremity Assessment: Generalized weakness;LLE deficits/detail LLE Deficits / Details: at least 3/5 AROM hip flexion/aBd/aDduction; L hip appearing to in flexed position (anticipate impaired L hip extension)    Cervical / Trunk Assessment Cervical / Trunk Assessment: Normal  Communication   Communication Communication: No apparent difficulties    Cognition Arousal: Alert Behavior During Therapy: WFL for tasks assessed/performed   PT - Cognitive impairments: No apparent impairments                         Following commands: Intact       Cueing Cueing Techniques: Verbal cues     General Comments General comments (skin integrity, edema, etc.): L AKA dressing in place.    Exercises     Assessment/Plan    PT Assessment Patient needs continued PT services  PT Problem List Decreased strength;Decreased activity tolerance;Decreased balance;Decreased mobility;Decreased knowledge of use of DME;Decreased skin integrity;Pain       PT Treatment Interventions DME instruction;Gait training;Stair training;Functional mobility training;Therapeutic activities;Therapeutic exercise;Balance training;Patient/family education    PT Goals (Current goals can be found in the Care Plan section)  Acute Rehab  PT Goals Patient Stated Goal: to improve pain and functional mobility PT Goal Formulation: With patient Time For Goal Achievement: 02/13/24 Potential to Achieve Goals: Good    Frequency Min 2X/week     Co-evaluation PT/OT/SLP Co-Evaluation/Treatment: Yes Reason for Co-Treatment: Necessary to address cognition/behavior during functional activity;To address functional/ADL transfers PT goals addressed during session: Mobility/safety with mobility OT goals addressed during session: ADL's and self-care       AM-PAC PT "6 Clicks" Mobility  Outcome Measure Help needed turning from your back to your side while in a flat bed without using bedrails?: None Help needed moving from lying on your back to sitting on the side of a flat bed without using bedrails?: None Help needed moving to and from a bed to a chair (including a wheelchair)?: A Little Help needed standing up from a chair using your arms (e.g., wheelchair or bedside chair)?: A Little Help needed to walk in hospital room?: A Lot Help needed climbing 3-5 steps with a railing? : Total 6 Click Score: 17    End of Session Equipment Utilized During Treatment: Gait belt Activity Tolerance: Patient limited by pain Patient left: in bed;with call bell/phone within reach;with bed alarm set;with family/visitor present;Other (comment) (Transport present to take pt for testing) Nurse Communication: Mobility status;Precautions;Other (comment) (Pt's pain status) PT Visit Diagnosis: Other abnormalities of gait and mobility (R26.89);Unsteadiness on feet (R26.81);Muscle weakness (generalized) (M62.81);History of falling (Z91.81);Pain Pain - Right/Left: Left  Pain - part of body:  (AKA)    Time: 1478-2956 PT Time Calculation (min) (ACUTE ONLY): 13 min   Charges:   PT Evaluation $PT Eval Low Complexity: 1 Low   PT General Charges $$ ACUTE PT VISIT: 1 Visit        Hendricks Limes, PT 01/30/24, 2:10 PM

## 2024-01-30 NOTE — Plan of Care (Signed)
  Problem: Coping: Goal: Ability to adjust to condition or change in health will improve Outcome: Progressing   Problem: Nutritional: Goal: Maintenance of adequate nutrition will improve Outcome: Progressing   Problem: Coping: Goal: Level of anxiety will decrease Outcome: Progressing   Problem: Pain Managment: Goal: General experience of comfort will improve and/or be controlled Outcome: Progressing   Problem: Safety: Goal: Ability to remain free from injury will improve Outcome: Progressing

## 2024-01-30 NOTE — Plan of Care (Signed)
  Problem: Fluid Volume: Goal: Ability to maintain a balanced intake and output will improve Outcome: Progressing   Problem: Nutritional: Goal: Maintenance of adequate nutrition will improve Outcome: Progressing   Problem: Skin Integrity: Goal: Risk for impaired skin integrity will decrease Outcome: Progressing   Problem: Education: Goal: Knowledge of General Education information will improve Description: Including pain rating scale, medication(s)/side effects and non-pharmacologic comfort measures Outcome: Progressing   Problem: Activity: Goal: Risk for activity intolerance will decrease Outcome: Progressing   Problem: Nutrition: Goal: Adequate nutrition will be maintained Outcome: Progressing   Problem: Coping: Goal: Level of anxiety will decrease Outcome: Progressing   Problem: Elimination: Goal: Will not experience complications related to bowel motility Outcome: Progressing Goal: Will not experience complications related to urinary retention Outcome: Progressing   Problem: Pain Managment: Goal: General experience of comfort will improve and/or be controlled Outcome: Progressing   Problem: Safety: Goal: Ability to remain free from injury will improve Outcome: Progressing   Problem: Skin Integrity: Goal: Risk for impaired skin integrity will decrease Outcome: Progressing

## 2024-01-31 DIAGNOSIS — L03116 Cellulitis of left lower limb: Secondary | ICD-10-CM | POA: Diagnosis not present

## 2024-01-31 DIAGNOSIS — S78112A Complete traumatic amputation at level between left hip and knee, initial encounter: Secondary | ICD-10-CM | POA: Diagnosis not present

## 2024-01-31 DIAGNOSIS — R269 Unspecified abnormalities of gait and mobility: Secondary | ICD-10-CM | POA: Diagnosis not present

## 2024-01-31 LAB — MAGNESIUM: Magnesium: 2.5 mg/dL — ABNORMAL HIGH (ref 1.7–2.4)

## 2024-01-31 LAB — CBC
HCT: 21.6 % — ABNORMAL LOW (ref 39.0–52.0)
Hemoglobin: 7.1 g/dL — ABNORMAL LOW (ref 13.0–17.0)
MCH: 26.6 pg (ref 26.0–34.0)
MCHC: 32.9 g/dL (ref 30.0–36.0)
MCV: 80.9 fL (ref 80.0–100.0)
Platelets: 259 10*3/uL (ref 150–400)
RBC: 2.67 MIL/uL — ABNORMAL LOW (ref 4.22–5.81)
RDW: 16.3 % — ABNORMAL HIGH (ref 11.5–15.5)
WBC: 8.5 10*3/uL (ref 4.0–10.5)
nRBC: 0 % (ref 0.0–0.2)

## 2024-01-31 LAB — GLUCOSE, CAPILLARY
Glucose-Capillary: 360 mg/dL — ABNORMAL HIGH (ref 70–99)
Glucose-Capillary: 293 mg/dL — ABNORMAL HIGH (ref 70–99)
Glucose-Capillary: 341 mg/dL — ABNORMAL HIGH (ref 70–99)
Glucose-Capillary: 277 mg/dL — ABNORMAL HIGH (ref 70–99)

## 2024-01-31 LAB — BASIC METABOLIC PANEL
Anion gap: 6 (ref 5–15)
BUN: 42 mg/dL — ABNORMAL HIGH (ref 6–20)
CO2: 19 mmol/L — ABNORMAL LOW (ref 22–32)
Calcium: 7.8 mg/dL — ABNORMAL LOW (ref 8.9–10.3)
Chloride: 107 mmol/L (ref 98–111)
Creatinine, Ser: 2.42 mg/dL — ABNORMAL HIGH (ref 0.61–1.24)
GFR, Estimated: 32 mL/min — ABNORMAL LOW (ref >60)
Glucose, Bld: 344 mg/dL — ABNORMAL HIGH (ref 70–99)
Potassium: 4 mmol/L (ref 3.5–5.1)
Sodium: 132 mmol/L — ABNORMAL LOW (ref 135–145)

## 2024-01-31 LAB — TYPE AND SCREEN
ABO/RH(D): O POS
Antibody Screen: NEGATIVE

## 2024-01-31 LAB — PREPARE RBC (CROSSMATCH)

## 2024-01-31 LAB — PHOSPHORUS: Phosphorus: 3.9 mg/dL (ref 2.5–4.6)

## 2024-01-31 MED ORDER — INSULIN GLARGINE 100 UNIT/ML ~~LOC~~ SOLN
40.0000 [IU] | Freq: Every day | SUBCUTANEOUS | Status: DC
  Administered 2024-01-31 – 2024-02-01 (×2): 40 [IU] via SUBCUTANEOUS
  Filled 2024-01-31 (×3): qty 0.4

## 2024-01-31 MED ORDER — INSULIN ASPART 100 UNIT/ML IJ SOLN
3.0000 [IU] | Freq: Three times a day (TID) | INTRAMUSCULAR | Status: DC
  Administered 2024-01-31 – 2024-02-02 (×6): 3 [IU] via SUBCUTANEOUS
  Filled 2024-01-31 (×6): qty 1

## 2024-01-31 MED ORDER — SODIUM CHLORIDE 0.9% IV SOLUTION: Freq: Once | INTRAVENOUS | Status: AC

## 2024-01-31 NOTE — [place] FL2 (Signed)
 [place] MEDICAID FL2 LEVEL OF CARE FORM     IDENTIFICATION  Patient Name: Richard Mendoza Birthdate: 12/30/1975 Sex: male Admission Date (Current Location): 01/23/2024  [place] and [place] Number:  Chiropodist and Address:  [facility], [address], [place], [place] [address]      Provider Number: [id number]  Attending Physician Name and Address:  Gillis Santa, MD  Relative Name and Phone Number:  Dash, Cardarelli (Mother)  [phone] (Home Phone)    Current Level of Care: Hospital Recommended Level of Care: Skilled Nursing Facility Prior Approval Number:    Date Approved/Denied:   PASRR Number:    Discharge Plan:      Current Diagnoses: Patient Active Problem List   Diagnosis Date Noted   Cellulitis 01/23/2024   Staphylococcal arthritis of right shoulder (HCC) 08/29/2023   Bacteremia due to Staphylococcus aureus 08/29/2023   Acute bursitis of right shoulder 08/24/2023   Iron deficiency anemia, unspecified 08/23/2023   Overweight (BMI 25.0-29.9) 08/22/2023   MRSA bacteremia 08/22/2023   Amputation stump infection (HCC) 08/20/2023   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) 06/01/2023   Chronic kidney disease, stage 3a (HCC) 06/01/2023   Tobacco abuse 06/01/2023   Cellulitis and abscess of left leg 04/30/2023   Cellulitis of left lower extremity 04/29/2023   Tobacco use 04/29/2023   Carpal tunnel syndrome 07/12/2022   Hyperglycemia due to type 2 diabetes mellitus (HCC) 07/12/2022   Polyneuropathy 07/12/2022   Ulnar neuropathy 07/12/2022   Acute epigastric pain 07/12/2022   Gastro-esophageal reflux disease without esophagitis 07/12/2022   Nausea and vomiting 07/12/2022   Abscess of right upper extremity 02/26/2022   Cellulitis of left lower extremity without foot    Sepsis (HCC) 09/08/2021   Hypokalemia 09/06/2021   Hyponatremia 09/06/2021   PAD (peripheral artery disease) (HCC) 05/26/2021   Acute kidney  injury superimposed on chronic kidney disease (HCC) 04/03/2021   Syncope 04/03/2021   Neck pain    Abnormal weight loss 12/09/2020   Change in bowel habit 12/09/2020   Constipation 12/09/2020   Epigastric pain 12/09/2020   Drug-induced constipation 12/09/2020   Periumbilical pain 12/09/2020   Fatty liver 12/09/2020   S/P BKA (below knee amputation), left (HCC) 11/20/2020   Necrotizing fasciitis of ankle and foot (HCC) 11/07/2020   History of 2019 novel coronavirus disease (COVID-19) 09/01/2020   Major depressive disorder, recurrent, in remission (HCC) 09/01/2020   Pain management contract signed 09/07/2019   Evaluation by psychiatric service required 08/18/2019   History of depression 08/18/2019   Primary osteoarthritis of both shoulders 07/16/2019   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) 07/16/2019   Chronic pain of both shoulders 07/16/2019   Cervical radicular pain 07/16/2019   S/P cervical spinal fusion 07/16/2019   Cervical spondylosis 07/16/2019   Cervical facet joint syndrome 07/16/2019   Chronic pain syndrome 07/16/2019   Anemia of chronic disease 05/25/2019   Osteomyelitis (HCC) 05/25/2019   Diabetic foot infection (HCC) 03/05/2019   Abscess 11/25/2018   Diabetic peripheral neuropathy associated with type 2 diabetes mellitus (HCC) 06/16/2018   Hyperlipidemia associated with type 2 diabetes mellitus (HCC) 06/16/2018   Intractable nausea and vomiting 10/21/2017   HNP (herniated nucleus pulposus), cervical 09/09/2017   Displacement of cervical intervertebral disc without myelopathy 08/27/2017   Erectile dysfunction 07/31/2017   Gastroesophageal reflux disease 07/31/2017   Rotator cuff syndrome of left shoulder 07/31/2017   Cellulitis of right leg 07/30/2016   Tobacco abuse disorder 07/30/2016  Routine general medical examination at a health care facility 01/20/2014   Diabetic neuropathy, type I diabetes mellitus (HCC) 01/20/2014   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    HLD (hyperlipidemia) 03/17/2009   DM type 2 with diabetic peripheral neuropathy (HCC) 03/02/2009   Uncontrolled diabetes mellitus with hyperglycemia (HCC) 12/08/2008   OBSTRUCTIVE SLEEP APNEA 12/08/2008   Essential hypertension 12/08/2008   Allergic rhinitis 12/15/2007   Other malaise and fatigue 12/15/2007    Orientation RESPIRATION BLADDER Height & Weight     Self, Time, Situation, Place  Normal Continent Weight: 170 lb (77.1 kg) Height:  5\' 10"  (177.8 cm)  BEHAVIORAL SYMPTOMS/MOOD NEUROLOGICAL BOWEL NUTRITION STATUS      Continent Diet (carb modified)  AMBULATORY STATUS COMMUNICATION OF NEEDS Skin   Limited Assist Verbally Surgical wounds (L leg)                       Personal Care Assistance Level of Assistance  Bathing, Feeding, Dressing Bathing Assistance: Limited assistance Feeding assistance: Independent Dressing Assistance: Limited assistance     Functional Limitations Info             SPECIAL CARE FACTORS FREQUENCY  PT (By licensed PT), OT (By licensed OT)     PT Frequency: 5 times per week OT Frequency: 5 times per week            Contractures      Additional Factors Info  Code Status, Allergies Code Status Info: full Allergies Info: Oxycodone           Current Medications (01/31/2024):  This is the current hospital active medication list Current Facility-Administered Medications  Medication Dose Route Frequency Provider Last Rate Last Admin   0.9 %  sodium chloride infusion (Manually program via Guardrails IV Fluids)   Intravenous Once Gillis Santa, MD       amLODipine (NORVASC) tablet 5 mg  5 mg Oral QPM Annice Needy, MD   5 mg at 01/30/24 1756   ascorbic acid (VITAMIN C) tablet 500 mg  500 mg Oral Daily Annice Needy, MD   500 mg at 01/31/24 1045   enoxaparin (LOVENOX) injection 40 mg  40 mg Subcutaneous QPM Annice Needy, MD   40 mg at 01/30/24 1756   fenofibrate tablet 54 mg  54 mg Oral Daily Annice Needy, MD   54 mg at 01/31/24 1045    gabapentin (NEURONTIN) capsule 200 mg  200 mg Oral TID Gillis Santa, MD   200 mg at 01/31/24 1045   HYDROcodone-acetaminophen (NORCO/VICODIN) 5-325 MG per tablet 1-2 tablet  1-2 tablet Oral Q6H PRN Annice Needy, MD   2 tablet at 01/30/24 1756   HYDROmorphone (DILAUDID) injection 1 mg  1 mg Intravenous Q4H PRN Gillis Santa, MD   1 mg at 01/31/24 1239   HYDROmorphone (DILAUDID) injection 1 mg  1 mg Intravenous Once Pace, Brien R, NP       HYDROmorphone (DILAUDID) tablet 2 mg  2 mg Oral Q6H PRN Annice Needy, MD   2 mg at 01/31/24 1043   insulin aspart (novoLOG) injection 0-5 Units  0-5 Units Subcutaneous QHS Annice Needy, MD   3 Units at 01/30/24 2209   insulin aspart (novoLOG) injection 0-9 Units  0-9 Units Subcutaneous TID WC Annice Needy, MD   5 Units at 01/31/24 1232   insulin aspart (novoLOG) injection 3 Units  3 Units Subcutaneous TID WC Gillis Santa, MD   3 Units at  01/31/24 1234   insulin glargine (LANTUS) injection 40 Units  40 Units Subcutaneous Daily Gillis Santa, MD   40 Units at 01/31/24 1108   iron polysaccharides (NIFEREX) capsule 150 mg  150 mg Oral Daily Annice Needy, MD   150 mg at 01/31/24 1045   methocarbamol (ROBAXIN) tablet 500 mg  500 mg Oral Q8H PRN Gillis Santa, MD   500 mg at 01/31/24 1044   Or   methocarbamol (ROBAXIN) injection 500 mg  500 mg Intravenous Q8H PRN Gillis Santa, MD       senna (SENOKOT) tablet 8.6 mg  1 tablet Oral BID Annice Needy, MD   8.6 mg at 01/31/24 1045   senna-docusate (Senokot-S) tablet 1 tablet  1 tablet Oral QHS PRN Annice Needy, MD       sodium bicarbonate tablet 650 mg  650 mg Oral TID Annice Needy, MD   650 mg at 01/31/24 1045   traZODone (DESYREL) tablet 100 mg  100 mg Oral QHS Annice Needy, MD   100 mg at 01/30/24 2210   Vitamin D (Ergocalciferol) (DRISDOL) 1.25 MG (50000 UNIT) capsule 50,000 Units  50,000 Units Oral Q7 days Annice Needy, MD   50,000 Units at 01/28/24 1206     Discharge Medications: Please see discharge summary  for a list of discharge medications.  Relevant Imaging Results:  Relevant Lab Results:   Additional Information SS #: [id number]  Alex Mcmanigal E Theodore Rahrig, LCSW

## 2024-01-31 NOTE — TOC Progression Note (Signed)
 Transition of Care [facility]) - Progression Note    Patient Details  Name: Richard Mendoza MRN: [id number] Date of Birth: [birth year]  [facility] CM/SW Contact  Liliana Cline, LCSW Phone Number: 01/31/2024, 3:15 PM  Clinical Narrative:    CSW spoke with patient at bedside. Patient states he wants SNF. He prefers the [place] area. SNF work up started. PASRR IS PENDING.        Expected Discharge Plan and Services                                               Social Determinants of Health (SDOH) Interventions SDOH Screenings   Food Insecurity: No Food Insecurity (01/25/2024)  Housing: Unknown (01/25/2024)  Transportation Needs: No Transportation Needs (01/25/2024)  Utilities: Not At Risk (01/25/2024)  Depression (PHQ2-9): Low Risk  (09/24/2023)  Financial Resource Strain: Low Risk  (07/24/2022)   Received from [facility], [facility]  Social Connections: Unknown (03/21/2022)   Received from [facility], [facility]  Stress: No Stress Concern Present (07/24/2022)   Received from [facility], [facility]  Tobacco Use: High Risk (01/29/2024)    Readmission Risk Interventions    01/24/2024   12:35 PM 08/21/2023    9:03 AM  Readmission Risk Prevention Plan  Transportation Screening Complete Complete  PCP or Specialist Appt within 3-5 Days Complete Complete  HRI or Home Care Consult Complete   Social Work Consult for Recovery Care Planning/Counseling Complete Complete  Palliative Care Screening Not Applicable Not Applicable  Medication Review Oceanographer) Complete Complete

## 2024-01-31 NOTE — Inpatient Diabetes Management (Signed)
 Inpatient Diabetes Program Recommendations  AACE/ADA: New Consensus Statement on Inpatient Glycemic Control (2015)  Target Ranges:  Prepandial:   less than 140 mg/dL      Peak postprandial:   less than 180 mg/dL (1-2 hours)      Critically ill patients:  140 - 180 mg/dL   Lab Results  Component Value Date   GLUCAP 360 (H) 01/31/2024   HGBA1C 13.6 (H) 01/24/2024    Latest Reference Range & Units 01/30/24 07:58 01/30/24 12:06 01/30/24 16:41 01/30/24 20:34 01/31/24 08:10  Glucose-Capillary 70 - 99 mg/dL 161 (H) 096 (H) 045 (H) 297 (H) 360 (H)  (H): Data is abnormally high  History: DM2   Home DM Meds: Medtronic insulin pump Fiasp Insulin Dexcom G6 CGM   Current Orders: Lantus 35 units daily Novolog Sensitive Correction Scale/ SSI (0-9 units) TID AC + HS    MD- Please consider:   1) Increase Lantus to 40 units daily   2) Start Novolog Meal Coverage: Novolog 3 units TID with meals HOLD if pt NPO HOLD if pt eats <50% meals  3. Pt requesting Rx for Novolog to use in his insulin pump at time of discharge  Thank you, Billy Fischer. Leslee Haueter, RN, MSN, CDCES  Diabetes Coordinator Inpatient Glycemic Control Team Team Pager [phone] (8am-5pm) 01/31/2024 9:22 AM

## 2024-01-31 NOTE — Plan of Care (Signed)

## 2024-01-31 NOTE — TOC PASRR Note (Signed)
To Whom It May Concern:  Please be advised that the above-named patient will require a short-term nursing home stay - anticipated 30 days or less for rehabilitation and strengthening.  The plan is for return home.  

## 2024-01-31 NOTE — Progress Notes (Signed)
 Progress Note   Patient: Richard Mendoza ZOX:[id number] DOB: 1976/09/29 DOA: 01/23/2024     8 DOS: the patient was seen and examined on 01/31/2024   Brief hospital course: 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  Assessment and Plan:  Left tibial osteomyelitis and stump abscess Presented with worsening erythema and edema and ill fitting prosthesis.   S/p Multiple previous hospitalizations for stump infection, most recent in October 2020 for, treated conservatively with antibiotics.  Previous recommendations by vascular surgery patient made require conversion from BKA to AKA.  Patient however is resistant against this recommendation.  When compared against last presentation, repeat MRI showing interval increase in subcutaneous fat edema and swelling as well as new abscesses in the anterior aspect. continued chronic appearing proximal tibial osteomyelitis.   Patient was sent by vascular surgery for admission, they are aware, when patient will agree for AKA then please consult vascular surgery S/p cefepime, Flagyl, vancomycin, d/c'd Abx on 3/13, procal negative   Monitor edema and erythema.   Will recheck CBC in AM.  Likely that patient will need to pursue AKA. 3/10 d/w patient to make a final decision to go home with IV antibiotics versus above-knee amputation so that we can call appropriate consult ID versus vascular surgery and plan accordingly. 3/11 patient agreed for appointment amputation.  Informed vascular surgery, we will keep n.p.o. for amputation tomorrow a.m. 3/12 s/p left AKA, seen after the procedure c/o severe pain Started Gabapentin 200 po TID and Robaxin prn and Dilaudid prn    Severe hypokalemia, resolved Potassium repleted. Monitor electrolytes daily.  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  Likely prerenal etiology exacerbation from uncontrolled diabetes.   Creatinine 2.34--2.39--2.42 Continue to monitor renal functions Continue oral hydration [place] no obstruction, no hydro, ch.med ds. Repeat BMP tomorrow a.m. 3/14 Bladder scan 253 post void, pt does not want foley catheter, RN was advised to do bladder scan q-shift    Metabolic acidosis due to renal failure Continue oral bicarbonate. Repeat BMP daily   Uncontrolled insulin-dependent diabetes mellitus - Exacerbated by infection.  Glucose greater than 600 on presentation.  Responded well to insulin coverage.  A1c 13.6 suggesting very poor control of her complaints prior to admission.   Still not well-controlled while inpatient.   3/14 increased insulin glargine from 35 u to 40 u daily.  Continue insulin sliding scale. Novolog 3 u TID  Tobacco use -Counseled on cessation.  Nicotine patch as needed  Major depressive disorder, recurrent, in remission (HCC) -Continue trazodone 100 mg nightly  Vitamin D deficiency: started vitamin D 50,000 units p.o. weekly, follow with PCP to repeat vitamin D level after 3 to 6 months.  Anemia due to Acute blood loss s/p Left AKA 3/14 Hb 7.1, transfused 1 PRBC Monitor H&H  Anemia due to mild iron deficiency and CKD Folic acid and B12 within normal range Transferrin saturation 20% wnl but iron level is 42 slightly low Started oral iron supplement with vitamin C   DVT prophylaxis: Lovenox [place]   Active Pressure Injury/Wound(s)     Pressure Ulcer  Duration          Pressure Injury 04/30/23 Knee Left;Medial Red erythema, abrasion to distal L knee 272 days  Subjective: No significant events overnight, c/o pain 7-9/10, not well controlled today,  agreed for 1 u PRBC transfusion, does not want foley catheter insertion at this time.   Physical Exam: Vitals:   01/30/24 2012 01/31/24 0333 01/31/24 0727  01/31/24 1219  BP: (!) 147/83 116/69 (!) 146/80 (!) 162/86  Pulse: 72 70 80 80  Resp: 18 18 20 19   Temp: 97.6 F (36.4 C) 97.9 F (36.6 C) 97.9 F (36.6 C) 97.8 F (36.6 C)  TempSrc: Oral Oral Oral Oral  SpO2: 100% 99% 100% 100%  Weight:      Height:       GENERAL:  Alert, pleasant, no acute distress  HEENT:  EOMI CARDIOVASCULAR:  RRR, no murmurs appreciated RESPIRATORY:  Clear to auscultation, no wheezing, rales, or rhonchi GASTROINTESTINAL:  Soft, nontender, nondistended EXTREMITIES: s/p Left AKA dressing CDI NEURO:  No new focal deficits appreciated SKIN: no rashes PSYCH:  Appropriate mood and affect   Data Reviewed:  There are no new results to review at this time.  Family Communication: None at bedside   Disposition: Status is: Inpatient Remains inpatient appropriate because: Stump infection requiring IV antibiotics  Planned Discharge Destination: Home with Home Health    Time spent: 55 minutes  Author: Gillis Santa, MD 01/31/2024 12:39 PM  For on call review [email or web address].

## 2024-01-31 NOTE — Progress Notes (Signed)
  Progress Note    01/31/2024 11:24 AM 2 Days Post-Op  Subjective:  [name] is a 48 year old male now postop day 1 from a left above-the-knee amputation.  Patient was originally admitted for left below the knee amputation infection.  After workup patient was noted to have considerable osteomyelitis in his tibial bone and therefore required an AKA.   This morning on exam patient was resting comfortably in bed.  No distress noted.  Patient does endorse some pain but says it is tolerable at this time.  No other complaints overnight.  Dressings clean dry and intact.  Vitals are remained stabl   Vitals:   01/31/24 0333 01/31/24 0727  BP: 116/69 (!) 146/80  Pulse: 70 80  Resp: 18 20  Temp: 97.9 F (36.6 C) 97.9 F (36.6 C)  SpO2: 99% 100%   Physical Exam: Cardiac:  RRR, normal S1 and S2.  No murmurs present. Lungs: Lungs clear on auscultation.  No rales rhonchi or wheezing. Incisions: Left AKA incision with dressing clean dry and intact.  No noted drainage. Extremities: Right lower extremity warm to touch with palpable pulses.  Left lower extremity with new AKA.  Dressings clean dry and intact. Abdomen: Positive bowel sounds throughout, soft, nontender and nondistended. Neurologic: Alert and oriented x 3, answers all questions and follows commands appropriately.  CBC    Component Value Date/Time   WBC 8.5 01/31/2024 0454   RBC 2.67 (L) 01/31/2024 0454   HGB 7.1 (L) 01/31/2024 0454   HCT 21.6 (L) 01/31/2024 0454   PLT 259 01/31/2024 0454   MCV 80.9 01/31/2024 0454   MCH 26.6 01/31/2024 0454   MCHC 32.9 01/31/2024 0454   RDW 16.3 (H) 01/31/2024 0454   LYMPHSABS 1.0 01/23/2024 1429   MONOABS 0.7 01/23/2024 1429   EOSABS 0.0 01/23/2024 1429   BASOSABS 0.0 01/23/2024 1429    BMET    Component Value Date/Time   NA 132 (L) 01/31/2024 0454   K 4.0 01/31/2024 0454   CL 107 01/31/2024 0454   CO2 19 (L) 01/31/2024 0454   GLUCOSE 344 (H) 01/31/2024 0454   BUN 42 (H)  01/31/2024 0454   CREATININE 2.42 (H) 01/31/2024 0454   CALCIUM 7.8 (L) 01/31/2024 0454   GFRNONAA 32 (L) 01/31/2024 0454   GFRAA >60 05/05/2020 1930    INR    Component Value Date/Time   INR 1.1 01/27/2024 0417     Intake/Output Summary (Last 24 hours) at 01/31/2024 1124 Last data filed at 01/31/2024 0930 Gross per 24 hour  Intake 1000 ml  Output 2295 ml  Net -1295 ml     Assessment/Plan:  48 y.o. male is s/p left revision of BKA to AKA  2 Days Post-Op   PLAN Pain medication as needed. OOB to chair today. Advance diet as tolerated. Continue to work with PT/OT. First dressing change by vascular surgery over the weekend. Patient expresses he would like to go to rehab on discharge.   DVT prophylaxis: Lovenox 40 mg subcu every 24   Richard Mendoza R [facility] 01/31/2024 11:24 AM

## 2024-02-01 DIAGNOSIS — L03116 Cellulitis of left lower limb: Secondary | ICD-10-CM | POA: Diagnosis not present

## 2024-02-01 DIAGNOSIS — S78112A Complete traumatic amputation at level between left hip and knee, initial encounter: Secondary | ICD-10-CM | POA: Diagnosis not present

## 2024-02-01 LAB — GLUCOSE, CAPILLARY
Glucose-Capillary: 253 mg/dL — ABNORMAL HIGH (ref 70–99)
Glucose-Capillary: 272 mg/dL — ABNORMAL HIGH (ref 70–99)
Glucose-Capillary: 270 mg/dL — ABNORMAL HIGH (ref 70–99)
Glucose-Capillary: 290 mg/dL — ABNORMAL HIGH (ref 70–99)

## 2024-02-01 LAB — TYPE AND SCREEN
ABO/RH(D): O POS
Antibody Screen: NEGATIVE
Unit division: 0

## 2024-02-01 LAB — BASIC METABOLIC PANEL
Anion gap: 2 — ABNORMAL LOW (ref 5–15)
BUN: 32 mg/dL — ABNORMAL HIGH (ref 6–20)
CO2: 19 mmol/L — ABNORMAL LOW (ref 22–32)
Calcium: 7.9 mg/dL — ABNORMAL LOW (ref 8.9–10.3)
Chloride: 111 mmol/L (ref 98–111)
Creatinine, Ser: 2.17 mg/dL — ABNORMAL HIGH (ref 0.61–1.24)
GFR, Estimated: 37 mL/min — ABNORMAL LOW (ref >60)
Glucose, Bld: 256 mg/dL — ABNORMAL HIGH (ref 70–99)
Potassium: 3.6 mmol/L (ref 3.5–5.1)
Sodium: 132 mmol/L — ABNORMAL LOW (ref 135–145)

## 2024-02-01 LAB — BPAM RBC
Blood Product Expiration Date: 202504072359
ISSUE DATE / TIME: 202503141713
Unit Type and Rh: 5100

## 2024-02-01 LAB — CBC
HCT: 25.5 % — ABNORMAL LOW (ref 39.0–52.0)
Hemoglobin: 8.4 g/dL — ABNORMAL LOW (ref 13.0–17.0)
MCH: 27.1 pg (ref 26.0–34.0)
MCHC: 32.9 g/dL (ref 30.0–36.0)
MCV: 82.3 fL (ref 80.0–100.0)
Platelets: 297 10*3/uL (ref 150–400)
RBC: 3.1 MIL/uL — ABNORMAL LOW (ref 4.22–5.81)
RDW: 15.6 % — ABNORMAL HIGH (ref 11.5–15.5)
WBC: 8.6 10*3/uL (ref 4.0–10.5)
nRBC: 0 % (ref 0.0–0.2)

## 2024-02-01 LAB — MAGNESIUM: Magnesium: 2.3 mg/dL (ref 1.7–2.4)

## 2024-02-01 LAB — PHOSPHORUS: Phosphorus: 3.4 mg/dL (ref 2.5–4.6)

## 2024-02-01 MED ORDER — ACETAMINOPHEN 500 MG PO TABS
500.0000 mg | ORAL_TABLET | Freq: Three times a day (TID) | ORAL | Status: DC
Start: 2024-02-01 — End: 2024-02-05
  Administered 2024-02-01 – 2024-02-05 (×12): 500 mg via ORAL
  Filled 2024-02-01 (×12): qty 1

## 2024-02-01 NOTE — Progress Notes (Signed)
 Progress Note   Richard Mendoza: Richard Mendoza ZOX:[id number] DOB: [birth year] DOA: 01/23/2024     9 DOS: the Richard Mendoza was seen and examined on 02/01/2024   Brief hospital course: Richard Mendoza is a 48 year old male with history of insulin-dependent diabetes mellitus on insulin pump, GERD, hypertension, who presents emergency department for chief concerns of wound check in the left leg at site of amputation.  Assessment and Plan:  Left tibial osteomyelitis and stump abscess Presented with worsening erythema and edema and ill fitting prosthesis.   S/p Multiple previous hospitalizations for stump infection, most recent in October 2020 for, treated conservatively with antibiotics.  Previous recommendations by vascular surgery Richard Mendoza made require conversion from BKA to AKA.  Richard Mendoza however is resistant against this recommendation.  When compared against last presentation, repeat MRI showing interval increase in subcutaneous fat edema and swelling as well as new abscesses in the anterior aspect. continued chronic appearing proximal tibial osteomyelitis.   Richard Mendoza was sent by vascular surgery for admission, they are aware, when Richard Mendoza will agree for AKA then please consult vascular surgery S/p cefepime, Flagyl, vancomycin, d/c'd Abx on 3/13, procal negative   Monitor edema and erythema.   Will recheck CBC in AM.  Likely that Richard Mendoza will need to pursue AKA. 3/10 d/w Richard Mendoza to make a final decision to go home with IV antibiotics versus above-knee amputation so that we can call appropriate consult ID versus vascular surgery and plan accordingly. 3/11 Richard Mendoza agreed for appointment amputation.  Informed vascular surgery, we will keep n.p.o. for amputation tomorrow a.m. 3/12 s/p left AKA, seen after the procedure c/o severe pain Started Gabapentin 200 po TID and Robaxin prn and Dilaudid prn    Severe hypokalemia, resolved Potassium repleted. Monitor electrolytes daily.  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  Likely prerenal etiology exacerbation from uncontrolled diabetes.   Creatinine 2.34--2.39--2.42--2.17 Continue to monitor renal functions Continue oral hydration [place] no obstruction, no hydro, ch.med ds. Repeat BMP tomorrow a.m. 3/14 Bladder scan 253 post void, pt does not want foley catheter, RN was advised to do bladder scan q-shift    Metabolic acidosis due to renal failure Continue oral bicarbonate. Repeat BMP daily   Uncontrolled insulin-dependent diabetes mellitus - Exacerbated by infection.  Glucose greater than 600 on presentation.  Responded well to insulin coverage.  A1c 13.6 suggesting very poor control of her complaints prior to admission.   Still not well-controlled while inpatient.   3/14 increased insulin glargine from 35 u to 40 u daily.  Continue insulin sliding scale. Novolog 3 u TID  Tobacco use -Counseled on cessation.  Nicotine patch as needed  Major depressive disorder, recurrent, in remission (HCC) -Continue trazodone 100 mg nightly  Vitamin D deficiency: started vitamin D 50,000 units p.o. weekly, follow with PCP to repeat vitamin D level after 3 to 6 months.  Anemia due to Acute blood loss s/p Left AKA 3/14 Hb 7.1, transfused 1 PRBC 3/15 Hb 8.4 posttransfusion Monitor H&H   Anemia due to mild iron deficiency and CKD Folic acid and B12 within normal range Transferrin saturation 20% wnl but iron level is 42 slightly low Started oral iron supplement with vitamin C   DVT prophylaxis: Lovenox [place]   Active Pressure Injury/Wound(s)     Pressure Ulcer  Duration          Pressure Injury 04/30/23 Knee Left;Medial Red erythema, abrasion to distal L knee 272 days  Subjective: No significant events overnight, c/o pain 7-8/10 today, well controlled with pain meds. Voiding well, no problem with urination    Physical Exam: Vitals:   01/31/24 1747 01/31/24 2030 02/01/24  0424 02/01/24 0903  BP: (!) 163/87 (!) 168/85 (!) 153/87 (!) 151/82  Pulse: 74 78 80 80  Resp: 17 18 20 18   Temp: 98 F (36.7 C) 98 F (36.7 C) 98 F (36.7 C) 98.2 F (36.8 C)  TempSrc: Oral Oral    SpO2: 100% 100% 100% 100%  Weight:      Height:       GENERAL:  Alert, pleasant, no acute distress  HEENT:  EOMI CARDIOVASCULAR:  RRR, no murmurs appreciated RESPIRATORY:  Clear to auscultation, no wheezing, rales, or rhonchi GASTROINTESTINAL:  Soft, nontender, nondistended EXTREMITIES: s/p Left AKA dressing CDI NEURO:  No new focal deficits appreciated SKIN: no rashes PSYCH:  Appropriate mood and affect   Data Reviewed:  There are no new results to review at this time.  Family Communication: None at bedside   Disposition: Status is: Inpatient Remains inpatient appropriate because: Stump infection requiring IV antibiotics  Planned Discharge Destination: Home with Home Health    Time spent: 40 minutes  Author: Gillis Santa, MD 02/01/2024 1:57 PM  For on call review [email or web address].

## 2024-02-01 NOTE — TOC Progression Note (Addendum)
 Transition of Care [facility]) - Progression Note    Patient Details  Name: Richard Mendoza MRN: [id number] Date of Birth: 24-Dec-1975  Transition of Care [facility]) CM/SW Contact  Liliana Cline, LCSW Phone Number: 02/01/2024, 11:47 AM  Clinical Narrative:    Additional information uploaded to [facility]. PASRR is pending. Awaiting bed offers for SNF.       Expected Discharge Plan and Services                                               Social Determinants of Health (SDOH) Interventions SDOH Screenings   Food Insecurity: No Food Insecurity (01/25/2024)  Housing: Unknown (01/25/2024)  Transportation Needs: No Transportation Needs (01/25/2024)  Utilities: Not At Risk (01/25/2024)  Depression (PHQ2-9): Low Risk  (09/24/2023)  Financial Resource Strain: Low Risk  (07/24/2022)   Received from [facility], [facility]  Social Connections: Unknown (03/21/2022)   Received from [facility], [facility]  Stress: No Stress Concern Present (07/24/2022)   Received from [facility], [facility]  Tobacco Use: High Risk (01/29/2024)    Readmission Risk Interventions    01/24/2024   12:35 PM 08/21/2023    9:03 AM  Readmission Risk Prevention Plan  Transportation Screening Complete Complete  PCP or Specialist Appt within 3-5 Days Complete Complete  HRI or Home Care Consult Complete   Social Work Consult for Recovery Care Planning/Counseling Complete Complete  Palliative Care Screening Not Applicable Not Applicable  Medication Review [employer]) Complete Complete

## 2024-02-02 DIAGNOSIS — S78112A Complete traumatic amputation at level between left hip and knee, initial encounter: Secondary | ICD-10-CM | POA: Diagnosis not present

## 2024-02-02 DIAGNOSIS — L03116 Cellulitis of left lower limb: Secondary | ICD-10-CM | POA: Diagnosis not present

## 2024-02-02 LAB — BASIC METABOLIC PANEL
Anion gap: 8 (ref 5–15)
BUN: 32 mg/dL — ABNORMAL HIGH (ref 6–20)
CO2: 20 mmol/L — ABNORMAL LOW (ref 22–32)
Calcium: 8.2 mg/dL — ABNORMAL LOW (ref 8.9–10.3)
Chloride: 107 mmol/L (ref 98–111)
Creatinine, Ser: 1.98 mg/dL — ABNORMAL HIGH (ref 0.61–1.24)
GFR, Estimated: 41 mL/min — ABNORMAL LOW (ref >60)
Glucose, Bld: 264 mg/dL — ABNORMAL HIGH (ref 70–99)
Potassium: 3.7 mmol/L (ref 3.5–5.1)
Sodium: 135 mmol/L (ref 135–145)

## 2024-02-02 LAB — CBC
HCT: 24.3 % — ABNORMAL LOW (ref 39.0–52.0)
Hemoglobin: 7.9 g/dL — ABNORMAL LOW (ref 13.0–17.0)
MCH: 26.5 pg (ref 26.0–34.0)
MCHC: 32.5 g/dL (ref 30.0–36.0)
MCV: 81.5 fL (ref 80.0–100.0)
Platelets: 262 10*3/uL (ref 150–400)
RBC: 2.98 MIL/uL — ABNORMAL LOW (ref 4.22–5.81)
RDW: 15.7 % — ABNORMAL HIGH (ref 11.5–15.5)
WBC: 8.2 10*3/uL (ref 4.0–10.5)
nRBC: 0 % (ref 0.0–0.2)

## 2024-02-02 LAB — GLUCOSE, CAPILLARY
Glucose-Capillary: 291 mg/dL — ABNORMAL HIGH (ref 70–99)
Glucose-Capillary: 235 mg/dL — ABNORMAL HIGH (ref 70–99)
Glucose-Capillary: 203 mg/dL — ABNORMAL HIGH (ref 70–99)
Glucose-Capillary: 274 mg/dL — ABNORMAL HIGH (ref 70–99)

## 2024-02-02 MED ORDER — INSULIN GLARGINE 100 UNIT/ML ~~LOC~~ SOLN
45.0000 [IU] | Freq: Every day | SUBCUTANEOUS | Status: DC
  Administered 2024-02-02 – 2024-02-05 (×4): 45 [IU] via SUBCUTANEOUS
  Filled 2024-02-02 (×4): qty 0.45

## 2024-02-02 MED ORDER — HYDROMORPHONE HCL 1 MG/ML IJ SOLN
1.0000 mg | INTRAMUSCULAR | Status: DC | PRN
  Administered 2024-02-02 – 2024-02-05 (×14): 1 mg via INTRAVENOUS
  Filled 2024-02-02 (×17): qty 1

## 2024-02-02 MED ORDER — INSULIN ASPART 100 UNIT/ML IJ SOLN
8.0000 [IU] | Freq: Three times a day (TID) | INTRAMUSCULAR | Status: DC
  Administered 2024-02-02 – 2024-02-05 (×9): 8 [IU] via SUBCUTANEOUS
  Filled 2024-02-02 (×9): qty 1

## 2024-02-02 MED ORDER — HYDROMORPHONE HCL 2 MG PO TABS
2.0000 mg | ORAL_TABLET | ORAL | Status: DC | PRN
  Administered 2024-02-03 – 2024-02-04 (×3): 2 mg via ORAL
  Filled 2024-02-02 (×5): qty 1

## 2024-02-02 NOTE — Progress Notes (Signed)
 [facility]  Daily Progress Note   Subjective  -   No major complaints.  Left AKA is tender on dorsal thigh but not at the end.  Feels well otherwise.  Objective Vitals:   02/01/24 1721 02/01/24 2032 02/02/24 0354 02/02/24 0806  BP: (!) 154/83 (!) 153/84 134/79 (!) 165/86  Pulse: 75 75 66 76  Resp: 18 16 18 16   Temp: 98 F (36.7 C) 98.6 F (37 C) 98.5 F (36.9 C) 97.9 F (36.6 C)  TempSrc:  Oral    SpO2: 99% 99% 99% 100%  Weight:      Height:        Intake/Output Summary (Last 24 hours) at 02/02/2024 1047 Last data filed at 02/02/2024 0431 Gross per 24 hour  Intake 720 ml  Output 1750 ml  Net -1030 ml    PULM  CTAB CV  RRR VASC  Left AKA dressing taken down and incision is clean and dry and intact.  I replaced a Mepilex dressing and applied a Coban compression wrap.  Laboratory CBC    Component Value Date/Time   WBC 8.2 02/02/2024 0433   HGB 7.9 (L) 02/02/2024 0433   HCT 24.3 (L) 02/02/2024 0433   PLT 262 02/02/2024 0433    BMET    Component Value Date/Time   NA 135 02/02/2024 0433   K 3.7 02/02/2024 0433   CL 107 02/02/2024 0433   CO2 20 (L) 02/02/2024 0433   GLUCOSE 264 (H) 02/02/2024 0433   BUN 32 (H) 02/02/2024 0433   CREATININE 1.98 (H) 02/02/2024 0433   CALCIUM 8.2 (L) 02/02/2024 0433   GFRNONAA 41 (L) 02/02/2024 0433   GFRAA >60 05/05/2020 1930    Assessment/Planning: POD #3 s/p Left AKA  Incision looks great.  Redressed and this can stay for up to a week.  OK for placement/home/rehab from Vascular standpoint.   Iline Oven  02/02/2024, 10:47 AM

## 2024-02-02 NOTE — Plan of Care (Signed)
  Problem: Education: Goal: Ability to describe self-care measures that may prevent or decrease complications (Diabetes Survival Skills Education) will improve Outcome: Progressing Goal: Individualized Educational Video(s) Outcome: Progressing   Problem: Skin Integrity: Goal: Risk for impaired skin integrity will decrease Outcome: Progressing   Problem: Education: Goal: Knowledge of General Education information will improve Description: Including pain rating scale, medication(s)/side effects and non-pharmacologic comfort measures Outcome: Progressing   Problem: Pain Managment: Goal: General experience of comfort will improve and/or be controlled Outcome: Progressing   Problem: Clinical Measurements: Goal: Ability to avoid or minimize complications of infection will improve Outcome: Progressing

## 2024-02-02 NOTE — Progress Notes (Signed)
 Progress Note   Patient: Richard Mendoza ZOX:[id number] DOB: 06/25/1976 DOA: 01/23/2024     10 DOS: the patient was seen and examined on 02/02/2024   Brief hospital course: 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  Assessment and Plan:  # Left tibial osteomyelitis and stump abscess Presented with worsening erythema and edema and ill fitting prosthesis.   S/p Multiple previous hospitalizations for stump infection, most recent in October 2020 for, treated conservatively with antibiotics.  Previous recommendations by vascular surgery patient made require conversion from BKA to AKA.  Patient however is resistant against this recommendation.  When compared against last presentation, repeat MRI showing interval increase in subcutaneous fat edema and swelling as well as new abscesses in the anterior aspect. continued chronic appearing proximal tibial osteomyelitis.   Patient was sent by vascular surgery for admission, they are aware, when patient will agree for AKA then please consult vascular surgery S/p cefepime, Flagyl, vancomycin, d/c'd Abx on 3/13, procal negative   Monitor edema and erythema.   Will recheck CBC in AM.  Likely that patient will need to pursue AKA. 3/10 d/w patient to make a final decision to go home with IV antibiotics versus above-knee amputation so that we can call appropriate consult ID versus vascular surgery and plan accordingly. 3/11 patient agreed for appointment amputation.  Informed vascular surgery, we will keep n.p.o. for amputation tomorrow a.m. 3/12 s/p left AKA, seen after the procedure c/o severe pain Started Gabapentin 200 po TID and Robaxin prn and Dilaudid prn  3/16 left AKA dressing take down and replaced a Mepilex dressing and applied a coban compression wrap. This can  stay for up to a week. Cleared by vasc sx to dc to SNF and f/u in 1 wk as an out patient.   # Severe hypokalemia, resolved Potassium repleted. Monitor electrolytes daily.  # Acute kidney injury on CKD 3a - Previous creatinine baseline 1.4, now up to 2.5 on presentation.  Likely prerenal etiology exacerbation from uncontrolled diabetes.   Creatinine 2.34--2.39--2.42--2.17--1.98 Continue to monitor renal functions Continue oral hydration [place] no obstruction, no hydro, ch.med ds. Repeat BMP tomorrow a.m. 3/15 Bladder scan was zero post void   # Metabolic acidosis due to renal failure Continue oral bicarbonate. Repeat BMP daily   # Uncontrolled insulin-dependent diabetes mellitus - Exacerbated by infection.  Glucose greater than 600 on presentation.  Responded well to insulin coverage.  A1c 13.6 suggesting very poor control of her complaints prior to admission.   Still not well-controlled while inpatient.   3/14 increased insulin glargine from 35 u to 40 u daily.  Continue insulin sliding scale. Novolog 3 u TID  # Tobacco use -Counseled on cessation.  Nicotine patch as needed  # Major depressive disorder, recurrent, in remission (HCC) -Continue trazodone 100 mg nightly  # Vitamin D deficiency: started vitamin D 50,000 units p.o. weekly, follow with PCP to repeat vitamin D level after 3 to 6 months.  # Anemia due to Acute blood loss s/p Left AKA 3/14 Hb 7.1, transfused 1 PRBC 3/15 Hb 8.4 posttransfusion, on 3/16 Hb 7.9 Monitor H&H   # Anemia due to mild iron deficiency and CKD Folic acid and B12 within normal range Transferrin saturation 20% wnl but iron level is 42 slightly low Started oral iron supplement with vitamin C   DVT prophylaxis:  Lovenox [place]   Active Pressure Injury/Wound(s)     Pressure Ulcer  Duration          Pressure Injury 04/30/23 Knee Left;Medial Red erythema, abrasion to distal L knee 272 days            Subjective: No significant events  overnight, c/o pain 5/10 today, well controlled with pain meds.  Left AKA dressing was replaced per general surgery, tolerated well, denies any complaints.   Physical Exam: Vitals:   02/01/24 2032 02/02/24 0354 02/02/24 0806 02/02/24 1345  BP: (!) 153/84 134/79 (!) 165/86 (!) 142/78  Pulse: 75 66 76 76  Resp: 16 18 16 16   Temp: 98.6 F (37 C) 98.5 F (36.9 C) 97.9 F (36.6 C) 98.2 F (36.8 C)  TempSrc: Oral     SpO2: 99% 99% 100% 100%  Weight:      Height:       GENERAL:  Alert, pleasant, no acute distress  HEENT:  EOMI CARDIOVASCULAR:  RRR, no murmurs appreciated RESPIRATORY:  Clear to auscultation, no wheezing, rales, or rhonchi GASTROINTESTINAL:  Soft, nontender, nondistended EXTREMITIES: s/p Left AKA dressing CDI NEURO:  No new focal deficits appreciated SKIN: no rashes PSYCH:  Appropriate mood and affect   Data Reviewed:  There are no new results to review at this time.  Family Communication: None at bedside   Disposition: Status is: Inpatient Remains inpatient appropriate because: Stump infection requiring IV antibiotics  Planned Discharge Destination: Home with Home Health    Time spent: 40 minutes  Author: Gillis Santa, MD 02/02/2024 2:25 PM  For on call review [email or web address].

## 2024-02-03 DIAGNOSIS — L03116 Cellulitis of left lower limb: Secondary | ICD-10-CM | POA: Diagnosis not present

## 2024-02-03 LAB — GLUCOSE, CAPILLARY
Glucose-Capillary: 280 mg/dL — ABNORMAL HIGH (ref 70–99)
Glucose-Capillary: 219 mg/dL — ABNORMAL HIGH (ref 70–99)
Glucose-Capillary: 303 mg/dL — ABNORMAL HIGH (ref 70–99)
Glucose-Capillary: 259 mg/dL — ABNORMAL HIGH (ref 70–99)

## 2024-02-03 LAB — CBC
HCT: 25.4 % — ABNORMAL LOW (ref 39.0–52.0)
Hemoglobin: 8.5 g/dL — ABNORMAL LOW (ref 13.0–17.0)
MCH: 27.2 pg (ref 26.0–34.0)
MCHC: 33.5 g/dL (ref 30.0–36.0)
MCV: 81.4 fL (ref 80.0–100.0)
Platelets: 286 10*3/uL (ref 150–400)
RBC: 3.12 MIL/uL — ABNORMAL LOW (ref 4.22–5.81)
RDW: 15.9 % — ABNORMAL HIGH (ref 11.5–15.5)
WBC: 10.2 10*3/uL (ref 4.0–10.5)
nRBC: 0 % (ref 0.0–0.2)

## 2024-02-03 LAB — BASIC METABOLIC PANEL
Anion gap: 6 (ref 5–15)
BUN: 32 mg/dL — ABNORMAL HIGH (ref 6–20)
CO2: 22 mmol/L (ref 22–32)
Calcium: 8.6 mg/dL — ABNORMAL LOW (ref 8.9–10.3)
Chloride: 108 mmol/L (ref 98–111)
Creatinine, Ser: 2.18 mg/dL — ABNORMAL HIGH (ref 0.61–1.24)
GFR, Estimated: 37 mL/min — ABNORMAL LOW (ref >60)
Glucose, Bld: 284 mg/dL — ABNORMAL HIGH (ref 70–99)
Potassium: 4.2 mmol/L (ref 3.5–5.1)
Sodium: 136 mmol/L (ref 135–145)

## 2024-02-03 LAB — SURGICAL PATHOLOGY

## 2024-02-03 LAB — PHOSPHORUS: Phosphorus: 3.4 mg/dL (ref 2.5–4.6)

## 2024-02-03 LAB — MAGNESIUM: Magnesium: 2.2 mg/dL (ref 1.7–2.4)

## 2024-02-03 MED ORDER — HYDRALAZINE HCL 20 MG/ML IJ SOLN: 10.0000 mg | Freq: Four times a day (QID) | INTRAMUSCULAR | Status: DC | PRN

## 2024-02-03 MED ORDER — HYDRALAZINE HCL 50 MG PO TABS: 50.0000 mg | ORAL_TABLET | Freq: Four times a day (QID) | ORAL | Status: DC | PRN

## 2024-02-03 MED ORDER — SODIUM BICARBONATE 650 MG PO TABS
650.0000 mg | ORAL_TABLET | Freq: Two times a day (BID) | ORAL | Status: DC
  Administered 2024-02-03 – 2024-02-05 (×5): 650 mg via ORAL
  Filled 2024-02-03 (×5): qty 1

## 2024-02-03 NOTE — Progress Notes (Signed)
  Progress Note    02/03/2024 3:03 PM 5 Days Post-Op  Subjective:   [name] is a 48 year old male now postop day 1 from a left above-the-knee amputation.  Patient was originally admitted for left below the knee amputation infection.  After workup patient was noted to have considerable osteomyelitis in his tibial bone and therefore required an AKA.   This morning on exam patient was standing at the bedside using crutches to ambulate comfortably.  No distress noted.  Patient does endorse some pain but says it is tolerable at this time.  No other complaints overnight.  Dressings were changed over the weekend and remain clean dry and intact.  Vitals are remained stable.   Vitals:   02/03/24 0204 02/03/24 0839  BP: (!) 152/84 (!) 171/90  Pulse: 77 88  Resp: 18 14  Temp: 97.8 F (36.6 C) 97.7 F (36.5 C)  SpO2: 100% 100%   Physical Exam: Cardiac:  RRR, normal S1 and S2.  No murmurs present. Lungs: Lungs clear on auscultation.  No rales rhonchi or wheezing. Incisions: Left AKA incision with dressing clean dry and intact.  No noted drainage. Extremities: Right lower extremity warm to touch with palpable pulses.  Left lower extremity with new AKA.  Dressings clean dry and intact. Abdomen: Positive bowel sounds throughout, soft, nontender and nondistended. Neurologic: Alert and oriented x 3, answers all questions and follows commands appropriately.  CBC    Component Value Date/Time   WBC 10.2 02/03/2024 0640   RBC 3.12 (L) 02/03/2024 0640   HGB 8.5 (L) 02/03/2024 0640   HCT 25.4 (L) 02/03/2024 0640   PLT 286 02/03/2024 0640   MCV 81.4 02/03/2024 0640   MCH 27.2 02/03/2024 0640   MCHC 33.5 02/03/2024 0640   RDW 15.9 (H) 02/03/2024 0640   LYMPHSABS 1.0 01/23/2024 1429   MONOABS 0.7 01/23/2024 1429   EOSABS 0.0 01/23/2024 1429   BASOSABS 0.0 01/23/2024 1429    BMET    Component Value Date/Time   NA 136 02/03/2024 0640   K 4.2 02/03/2024 0640   CL 108 02/03/2024 0640    CO2 22 02/03/2024 0640   GLUCOSE 284 (H) 02/03/2024 0640   BUN 32 (H) 02/03/2024 0640   CREATININE 2.18 (H) 02/03/2024 0640   CALCIUM 8.6 (L) 02/03/2024 0640   GFRNONAA 37 (L) 02/03/2024 0640   GFRAA >60 05/05/2020 1930    INR    Component Value Date/Time   INR 1.1 01/27/2024 0417     Intake/Output Summary (Last 24 hours) at 02/03/2024 1503 Last data filed at 02/03/2024 1000 Gross per 24 hour  Intake --  Output 1325 ml  Net -1325 ml     Assessment/Plan:  49 y.o. 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 First dressing change completed by Vascular Surgery over the weekend.   DVT prophylaxis:  Lovenox 40 mg subcu every 24    Perry Brucato R Shaylin Blatt [facility] 02/03/2024 3:03 PM

## 2024-02-03 NOTE — Plan of Care (Signed)
  Problem: Education: Goal: Ability to describe self-care measures that may prevent or decrease complications (Diabetes Survival Skills Education) will improve Outcome: Progressing Goal: Individualized Educational Video(s) Outcome: Progressing   Problem: Fluid Volume: Goal: Ability to maintain a balanced intake and output will improve Outcome: Progressing   Problem: Coping: Goal: Ability to adjust to condition or change in health will improve Outcome: Progressing

## 2024-02-03 NOTE — Inpatient Diabetes Management (Signed)
 Inpatient Diabetes Program Recommendations  AACE/ADA: New Consensus Statement on Inpatient Glycemic Control   Target Ranges:  Prepandial:   less than 140 mg/dL      Peak postprandial:   less than 180 mg/dL (1-2 hours)      Critically ill patients:  140 - 180 mg/dL    Latest Reference Range & Units 02/02/24 07:57 02/02/24 11:44 02/02/24 16:36 02/02/24 21:21 02/03/24 08:38  Glucose-Capillary 70 - 99 mg/dL 161 (H) 096 (H) 045 (H) 274 (H) 280 (H)   Review of Glycemic Control  Diabetes history: DM2 Outpatient Diabetes medications: Medtronic insulin pump with Fiasp, Dexcom G6 Current orders for Inpatient glycemic control: Lantus 45 units daily, Novolog 8 units TID with meals, Novolog 0-9 units TID with meals, Novolog 0-5 units at bedtime  Inpatient Diabetes Program Recommendations:    Insulin: CBG 280 mg/dl this morning. Please consider increasing Lantus to 50 units daily.  Outpatient: Patient has requested Rx for Novolog vials (#[id number]) at discharge.  Thanks, Orlando Penner, RN, MSN, CDCES Diabetes Coordinator Inpatient Diabetes Program [phone] (Team Pager from 8am to 5pm)

## 2024-02-03 NOTE — TOC Progression Note (Signed)
 Transition of Care [facility]) - Progression Note    Patient Details  Name: Samay Delcarlo MRN: [id number] Date of Birth: May 13, 1976  Transition of Care [facility]) CM/SW Contact  Chapman Fitch, RN Phone Number: 02/03/2024, 1:27 PM  Clinical Narrative:      PASRR [id number] E valid till 03/04/24 No bed offers Search extended [facility] listed as considering, VM left for admission to review         Expected Discharge Plan and Services                                               Social Determinants of Health (SDOH) Interventions SDOH Screenings   Food Insecurity: No Food Insecurity (01/25/2024)  Housing: Unknown (01/25/2024)  Transportation Needs: No Transportation Needs (01/25/2024)  Utilities: Not At Risk (01/25/2024)  Depression (PHQ2-9): Low Risk  (09/24/2023)  Financial Resource Strain: Low Risk  (07/24/2022)   Received from [facility], [facility]  Social Connections: Unknown (03/21/2022)   Received from [facility], [facility]  Stress: No Stress Concern Present (07/24/2022)   Received from [facility], [facility]  Tobacco Use: High Risk (01/29/2024)    Readmission Risk Interventions    01/24/2024   12:35 PM 08/21/2023    9:03 AM  Readmission Risk Prevention Plan  Transportation Screening Complete Complete  PCP or Specialist Appt within 3-5 Days Complete Complete  HRI or Home Care Consult Complete   Social Work Consult for Recovery Care Planning/Counseling Complete Complete  Palliative Care Screening Not Applicable Not Applicable  Medication Review Oceanographer) Complete Complete

## 2024-02-03 NOTE — Progress Notes (Signed)
 Progress Note   Patient: Richard Mendoza ZOX:[id number] DOB: [birth year] DOA: 01/23/2024     11 DOS: the patient was seen and examined on 02/03/2024   Brief hospital course: 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  Assessment and Plan:  # Left tibial osteomyelitis and stump abscess Presented with worsening erythema and edema and ill fitting prosthesis.   S/p Multiple previous hospitalizations for stump infection, most recent in October 2020 for, treated conservatively with antibiotics.  Previous recommendations by vascular surgery patient made require conversion from BKA to AKA.  Patient however is resistant against this recommendation.  When compared against last presentation, repeat MRI showing interval increase in subcutaneous fat edema and swelling as well as new abscesses in the anterior aspect. continued chronic appearing proximal tibial osteomyelitis.   Patient was sent by vascular surgery for admission, they are aware, when patient will agree for AKA then please consult vascular surgery S/p cefepime, Flagyl, vancomycin, d/c'd Abx on 3/13, procal negative   Monitor edema and erythema.   Will recheck CBC in AM.  Likely that patient will need to pursue AKA. 3/10 d/w patient to make a final decision to go home with IV antibiotics versus above-knee amputation so that we can call appropriate consult ID versus vascular surgery and plan accordingly. 3/11 patient agreed for appointment amputation.  Informed vascular surgery, we will keep n.p.o. for amputation tomorrow a.m. 3/12 s/p left AKA, seen after the procedure c/o severe pain Started Gabapentin 200 po TID and Robaxin prn and Dilaudid prn  3/16 left AKA dressing take down and replaced a Mepilex dressing and applied a coban compression wrap. This can  stay for up to a week. Cleared by vasc sx to dc to SNF and f/u in 1 wk as an out patient.   # Severe hypokalemia, resolved Potassium repleted. Monitor electrolytes daily.  # Acute kidney injury on CKD 3a - Previous creatinine baseline 1.4, now up to 2.5 on presentation.  Likely prerenal etiology exacerbation from uncontrolled diabetes.   Creatinine 2.34--2.39--2.42--2.17--1.98 Continue to monitor renal functions Continue oral hydration [place] no obstruction, no hydro, ch.med ds. Repeat BMP tomorrow a.m. 3/15 Bladder scan was zero post void   # Metabolic acidosis due to renal failure Continue oral bicarbonate. Repeat BMP daily   # Uncontrolled insulin-dependent diabetes mellitus - Exacerbated by infection.  Glucose greater than 600 on presentation.  Responded well to insulin coverage.  A1c 13.6 suggesting very poor control of her complaints prior to admission.   Still not well-controlled while inpatient.   3/14 increased insulin glargine from 35 u to 40 u daily.  Continue insulin sliding scale. Novolog 3 u TID  # Tobacco use -Counseled on cessation.  Nicotine patch as needed  # Major depressive disorder, recurrent, in remission (HCC) -Continue trazodone 100 mg nightly  # Vitamin D deficiency: started vitamin D 50,000 units p.o. weekly, follow with PCP to repeat vitamin D level after 3 to 6 months.  # Anemia due to Acute blood loss s/p Left AKA 3/14 Hb 7.1, transfused 1 PRBC 3/15 Hb 8.4 posttransfusion, on 3/16 Hb 7.9 Monitor H&H   # Anemia due to mild iron deficiency and CKD Folic acid and B12 within normal range Transferrin saturation 20% wnl but iron level is 42 slightly low Started oral iron supplement with vitamin C   DVT prophylaxis:  Lovenox [place]   Active Pressure Injury/Wound(s)     Pressure Ulcer  Duration          Pressure Injury 04/30/23 Knee Left;Medial Red erythema, abrasion to distal L knee 272 days            Subjective: No significant events  overnight, pain is well controlled on oral meds, denies any complaints.   Physical Exam: Vitals:   02/02/24 1345 02/02/24 1948 02/03/24 0204 02/03/24 0839  BP: (!) 142/78 (!) 158/85 (!) 152/84 (!) 171/90  Pulse: 76 80 77 88  Resp: 16 18 18 14   Temp: 98.2 F (36.8 C) 98.5 F (36.9 C) 97.8 F (36.6 C) 97.7 F (36.5 C)  TempSrc:  Oral Oral Oral  SpO2: 100% 100% 100% 100%  Weight:      Height:       GENERAL:  Alert, pleasant, no acute distress  HEENT:  EOMI CARDIOVASCULAR:  RRR, no murmurs appreciated RESPIRATORY:  Clear to auscultation, no wheezing, rales, or rhonchi GASTROINTESTINAL:  Soft, nontender, nondistended EXTREMITIES: s/p Left AKA dressing CDI NEURO:  No new focal deficits appreciated SKIN: no rashes PSYCH:  Appropriate mood and affect   Data Reviewed:  There are no new results to review at this time.  Family Communication: None at bedside   Disposition: Status is: Inpatient Remains inpatient appropriate because: Stump infection requiring IV antibiotics  Planned Discharge Destination: Home with Home Health    Time spent: 40 minutes  Author: Gillis Santa, MD 02/03/2024 4:06 PM  For on call review [email or web address].

## 2024-02-03 NOTE — Progress Notes (Signed)
 Physical Therapy Treatment Patient Details Name: Richard Mendoza MRN: [id number] DOB: 1976/10/24 Today's Date: 02/03/2024   History of Present Illness Pt is a 48 y.o. male presenting to hospital 01/23/24 with concerns for L BKA infection.  Pt admitted with LLE stump infection, hyponatremia, hypokalemia, uncontrolled DM with hyperglycemia.  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  PMH includes DM on insulin pump, htn, L BKA 2021, ACDF 2018, L RCR, R shoulder arthroscopy with debridement and bicep tenon repair.    PT Comments  Patient is agreeable to PT session. He is eager to get to rehab before going home. Standing performed with rolling walker with supervision. 3 standing bouts performed. Patient able to advance gait distance with cues for technique using rolling walker using hop to pattern. He does complain of increased residual limb pain with walking. Residual limb hip flexor stretching performed while standing in preparation for eventual gait training with prosthesis. Recommend to continue PT to maximize independence and decrease caregiver burden.    If plan is discharge home, recommend the following: A little help with walking and/or transfers;A little help with bathing/dressing/bathroom;Help with stairs or ramp for entrance;Assist for transportation   Can travel by private vehicle     Yes  Equipment Recommendations   (shower chair)    Recommendations for Other Services       Precautions / Restrictions Precautions Precautions: Fall Restrictions LLE Weight Bearing Per Provider Order: Non weight bearing     Mobility  Bed Mobility Overal bed mobility: Modified Independent                  Transfers Overall transfer level: Needs assistance Equipment used: Rolling walker (2 wheels) Transfers: Sit to/from Stand Sit to Stand: Supervision           General transfer comment: several standing bouts performed from bed with supervision for safety.  good safety awareness demonstrated    Ambulation/Gait Ambulation/Gait assistance: Contact guard assist Gait Distance (Feet): 12 Feet Assistive device: Rolling walker (2 wheels)   Gait velocity: decreased     General Gait Details: cues for technique for hop gait pattern. patient does complain of increased residual limb pain towards the end of walking bout. cues for proper use of rolling walker which was adjusted for proper height   Stairs             Wheelchair Mobility     Tilt Bed    Modified Rankin (Stroke Patients Only)       Balance Overall balance assessment: Needs assistance Sitting-balance support: No upper extremity supported, Feet supported Sitting balance-Leahy Scale: Good     Standing balance support: Bilateral upper extremity supported, During functional activity, Reliant on assistive device for balance Standing balance-Leahy Scale: Poor Standing balance comment: external support required                            Communication Communication Communication: No apparent difficulties  Cognition Arousal: Alert Behavior During Therapy: WFL for tasks assessed/performed   PT - Cognitive impairments: No apparent impairments                         Following commands: Intact      Cueing Cueing Techniques: Verbal cues, Tactile cues  Exercises Other Exercises Other Exercises: Patient provided with post-op AKA exercise/stretching/information packet. hip flexor stretching performed x 20 seconds x 2 bouts on reisual  limb while standing with tacitle and verbal cues for technique    General Comments        Pertinent Vitals/Pain Pain Assessment Pain Assessment: Faces Faces Pain Scale: Hurts a little bit Pain Location: L residual limb with movement Pain Descriptors / Indicators: Tightness Pain Intervention(s): Monitored during session, Limited activity within patient's tolerance, Repositioned    Home Living                           Prior Function            PT Goals (current goals can now be found in the care plan section) Acute Rehab PT Goals Patient Stated Goal: rehab PT Goal Formulation: With patient Time For Goal Achievement: 02/13/24 Potential to Achieve Goals: Good Progress towards PT goals: Progressing toward goals    Frequency    Min 2X/week      PT Plan      Co-evaluation              AM-PAC PT "6 Clicks" Mobility   Outcome Measure  Help needed turning from your back to your side while in a flat bed without using bedrails?: None Help needed moving from lying on your back to sitting on the side of a flat bed without using bedrails?: None Help needed moving to and from a bed to a chair (including a wheelchair)?: A Little Help needed standing up from a chair using your arms (e.g., wheelchair or bedside chair)?: A Little Help needed to walk in hospital room?: A Lot Help needed climbing 3-5 steps with a railing? : Total 6 Click Score: 17    End of Session   Activity Tolerance: Patient tolerated treatment well Patient left: in bed;with call bell/phone within reach;with family/visitor present   PT Visit Diagnosis: Other abnormalities of gait and mobility (R26.89);Unsteadiness on feet (R26.81);Muscle weakness (generalized) (M62.81);History of falling (Z91.81);Pain     Time: 2130-8657 PT Time Calculation (min) (ACUTE ONLY): 21 min  Charges:    $Therapeutic Activity: 8-22 mins PT General Charges $$ ACUTE PT VISIT: 1 Visit                    Donna Bernard, PT, MPT  Ina Homes 02/03/2024, 1:13 PM

## 2024-02-04 DIAGNOSIS — Z89612 Acquired absence of left leg above knee: Secondary | ICD-10-CM | POA: Diagnosis not present

## 2024-02-04 LAB — CBC
HCT: 24.7 % — ABNORMAL LOW (ref 39.0–52.0)
Hemoglobin: 8.2 g/dL — ABNORMAL LOW (ref 13.0–17.0)
MCH: 27.4 pg (ref 26.0–34.0)
MCHC: 33.2 g/dL (ref 30.0–36.0)
MCV: 82.6 fL (ref 80.0–100.0)
Platelets: 297 10*3/uL (ref 150–400)
RBC: 2.99 MIL/uL — ABNORMAL LOW (ref 4.22–5.81)
RDW: 15.7 % — ABNORMAL HIGH (ref 11.5–15.5)
WBC: 9.7 10*3/uL (ref 4.0–10.5)
nRBC: 0 % (ref 0.0–0.2)

## 2024-02-04 LAB — BASIC METABOLIC PANEL
Anion gap: 6 (ref 5–15)
BUN: 32 mg/dL — ABNORMAL HIGH (ref 6–20)
CO2: 21 mmol/L — ABNORMAL LOW (ref 22–32)
Calcium: 8.5 mg/dL — ABNORMAL LOW (ref 8.9–10.3)
Chloride: 106 mmol/L (ref 98–111)
Creatinine, Ser: 2.17 mg/dL — ABNORMAL HIGH (ref 0.61–1.24)
GFR, Estimated: 37 mL/min — ABNORMAL LOW (ref >60)
Glucose, Bld: 333 mg/dL — ABNORMAL HIGH (ref 70–99)
Potassium: 3.9 mmol/L (ref 3.5–5.1)
Sodium: 133 mmol/L — ABNORMAL LOW (ref 135–145)

## 2024-02-04 LAB — MAGNESIUM: Magnesium: 2.1 mg/dL (ref 1.7–2.4)

## 2024-02-04 LAB — GLUCOSE, CAPILLARY
Glucose-Capillary: 229 mg/dL — ABNORMAL HIGH (ref 70–99)
Glucose-Capillary: 216 mg/dL — ABNORMAL HIGH (ref 70–99)
Glucose-Capillary: 197 mg/dL — ABNORMAL HIGH (ref 70–99)
Glucose-Capillary: 227 mg/dL — ABNORMAL HIGH (ref 70–99)

## 2024-02-04 LAB — PHOSPHORUS: Phosphorus: 2.8 mg/dL (ref 2.5–4.6)

## 2024-02-04 MED ORDER — AMLODIPINE BESYLATE 10 MG PO TABS
10.0000 mg | ORAL_TABLET | Freq: Every evening | ORAL | Status: DC
  Administered 2024-02-04: 10 mg via ORAL
  Filled 2024-02-04: qty 1

## 2024-02-04 MED ORDER — PREGABALIN 50 MG PO CAPS
50.0000 mg | ORAL_CAPSULE | Freq: Three times a day (TID) | ORAL | Status: DC
  Administered 2024-02-04 – 2024-02-05 (×3): 50 mg via ORAL
  Filled 2024-02-04 (×3): qty 1

## 2024-02-04 NOTE — Plan of Care (Signed)

## 2024-02-04 NOTE — Progress Notes (Signed)
 Progress Note   Patient: Richard Mendoza ION:[id number] DOB: [birth year] DOA: 01/23/2024     12 DOS: the patient was seen and examined on 02/04/2024   Brief hospital course: 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  Assessment and Plan:  # Left tibial osteomyelitis and stump abscess, now s/p AKA done on 3/12 Repeat MRI showing interval increase in subcutaneous fat edema and swelling as well as new abscesses in the anterior aspect. continued chronic appearing proximal tibial osteomyelitis.   Patient was sent by vascular surgery for admission, they are aware, when patient will agree for AKA then please consult vascular surgery S/p cefepime, Flagyl, vancomycin, d/c'd Abx on 3/13, procal negative   3/10 d/w patient to make a final decision to go home with IV antibiotics versus above-knee amputation so that we can call appropriate consult ID versus vascular surgery and plan accordingly. 3/11 patient agreed for appointment amputation.  Informed vascular surgery,  3/12 s/p left AKA, seen after the procedure c/o severe pain Continue Robaxin prn and Dilaudid prn  3/16 left AKA dressing take down and replaced a Mepilex dressing and applied a coban compression wrap. This can stay for up to a week. Cleared by vasc sx to dc to SNF and f/u in 1 wk as an out patient. S/p Gabapentin 200 po TID, started Lyrica 50 mg p.o. 3 times daily on 3/18 as per request by patient   # Severe hypokalemia, resolved Potassium repleted. Monitor electrolytes daily.  # Acute kidney injury on CKD 3a - Previous creatinine baseline 1.4, now up to 2.5 on presentation.  Likely prerenal etiology exacerbation from uncontrolled diabetes.   Creatinine 2.34--2.39--2.42--2.17--1.98 Continue to monitor renal functions Continue oral hydration [place]  no obstruction, no hydro, ch.med ds. Repeat BMP tomorrow a.m. 3/15 Bladder scan was zero post void   # Metabolic acidosis due to renal failure Continue oral bicarbonate. Repeat BMP daily  # Hypertension, 3/18 increased amlodipine from 5 to 10 mg p.o. daily due to elevated blood pressure Use hydralazine as needed Monitor BP and titrate medications accordingly  # Uncontrolled insulin-dependent diabetes mellitus - Exacerbated by infection.  Glucose greater than 600 on presentation.  Responded well to insulin coverage.  A1c 13.6 suggesting very poor control of her complaints prior to admission.   Still not well-controlled while inpatient.   3/14 increased insulin glargine from 35 u to 40 u daily.  Continue insulin sliding scale. Novolog 3 u TID  # Tobacco use -Counseled on cessation.  Nicotine patch as needed  # Major depressive disorder, recurrent, in remission (HCC) -Continue trazodone 100 mg nightly  # Vitamin D deficiency: started vitamin D 50,000 units p.o. weekly, follow with PCP to repeat vitamin D level after 3 to 6 months.  # Anemia due to Acute blood loss s/p Left AKA 3/14 Hb 7.1, transfused 1 PRBC 3/15 Hb 8.4 posttransfusion, on 3/16 Hb 7.9 Monitor H&H   # Anemia due to mild iron deficiency and CKD Folic acid and B12 within normal range Transferrin saturation 20% wnl but iron level is 42 slightly low Started oral iron supplement with vitamin C   DVT prophylaxis: Lovenox [place]   Active Pressure Injury/Wound(s)     Pressure Ulcer  Duration          Pressure Injury 04/30/23 Knee Left;Medial Red erythema, abrasion to distal L knee  272 days            Subjective: No significant events overnight, pain is well controlled on oral meds, denies any complaints.   Physical Exam: Vitals:   02/03/24 2142 02/03/24 2257 02/04/24 0357 02/04/24 0919  BP: (!) 169/93 133/74 (!) 152/89 (!) 163/83  Pulse: 84 74 71 77  Resp:   20 18  Temp:   98.1 F (36.7 C) 98.1 F (36.7  C)  TempSrc:   Oral   SpO2:   100% 100%  Weight:      Height:       GENERAL:  Alert, pleasant, no acute distress  HEENT:  EOMI CARDIOVASCULAR:  RRR, no murmurs appreciated RESPIRATORY:  Clear to auscultation, no wheezing, rales, or rhonchi GASTROINTESTINAL:  Soft, nontender, nondistended EXTREMITIES: s/p Left AKA dressing CDI NEURO:  No new focal deficits appreciated SKIN: no rashes PSYCH:  Appropriate mood and affect   Data Reviewed:  There are no new results to review at this time.  Family Communication: None at bedside   Disposition: Status is: Inpatient Remains inpatient appropriate because: Stump infection requiring IV antibiotics, now s/p AKA  Planned Discharge Destination: Home with Home Health    Patient is stable to discharge, awaiting for placement, TOC following.  Insurance Auth is pending  Time spent: 40 minutes  Author: Gillis Santa, MD 02/04/2024 4:07 PM  For on call review [email or web address].

## 2024-02-04 NOTE — TOC Progression Note (Signed)
 Transition of Care [facility]) - Progression Note    Patient Details  Name: Richard Mendoza MRN: [id number] Date of Birth: October 04, 1976  [facility] CM/SW Contact  Chapman Fitch, RN Phone Number: 02/04/2024, 3:33 PM  Clinical Narrative:     Bed offer presented to patient. Patient accepts bed offer at [facility].  Accepted in [place], and notified [place] at [employer].  They are to start insurance auth today       Expected Discharge Plan and Services                                               Social Determinants of Health (SDOH) Interventions SDOH Screenings   Food Insecurity: No Food Insecurity (01/25/2024)  Housing: Unknown (01/25/2024)  Transportation Needs: No Transportation Needs (01/25/2024)  Utilities: Not At Risk (01/25/2024)  Depression (PHQ2-9): Low Risk  (09/24/2023)  Financial Resource Strain: Low Risk  (07/24/2022)   Received from [facility], [facility]  Social Connections: Unknown (03/21/2022)   Received from [facility], [facility]  Stress: No Stress Concern Present (07/24/2022)   Received from [facility], [facility]  Tobacco Use: High Risk (01/29/2024)    Readmission Risk Interventions    01/24/2024   12:35 PM 08/21/2023    9:03 AM  Readmission Risk Prevention Plan  Transportation Screening Complete Complete  PCP or Specialist Appt within 3-5 Days Complete Complete  HRI or Home Care Consult Complete   Social Work Consult for Recovery Care Planning/Counseling Complete Complete  Palliative Care Screening Not Applicable Not Applicable  Medication Review Oceanographer) Complete Complete

## 2024-02-04 NOTE — Progress Notes (Signed)
 Inpatient Rehab Admissions Coordinator:   Per updated therapy recommendations pt was screened for CIR candidacy by Estill Dooms, PT, DPT.  Pt admitted with L BKA infection, s/p conversion to AKA on 3/12.  Post op therapy evaluations pt min assist x10' with RW.  Currently pt has progressed to CGA 24' with RW, no updates from OT.  Unfortunately, pt has progressed past the level at which he would have qualified for CIR as he no longer requires physical assist for mobility.  We will not pursue consult at this time.  Estill Dooms, PT, DPT Admissions Coordinator [phone] 02/04/24  11:37 AM

## 2024-02-04 NOTE — Progress Notes (Signed)
 Occupational Therapy Treatment Patient Details Name: Richard Mendoza MRN: [id number] DOB: 05/13/1976 Today's Date: 02/04/2024   History of present illness Pt is a 48 y.o. male presenting to hospital 01/23/24 with concerns for L BKA infection.  Pt admitted with LLE stump infection, hyponatremia, hypokalemia, uncontrolled DM with hyperglycemia.  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  PMH includes DM on insulin pump, htn, L BKA 2021, ACDF 2018, L RCR, R shoulder arthroscopy with debridement and bicep tenon repair.   OT comments  Upon entering the room, pt supine in bed and agreeable to OT intervention. Pt reports need to use bathroom. Pt stands from bed with supervision and hops into bathroom ~ 10' with CGA. Pt stands for toileting needs with CGA for balance during LB clothing management. Pt returning to bed at end of session. Education regarding Designer, multimedia for B LE skin concerns and pain management. Pt is a very active participant in discussion and asking appropriate questions. Call bell and all needed items within reach upon exiting the room.       If plan is discharge home, recommend the following:  A little help with walking and/or transfers;A little help with bathing/dressing/bathroom;Assistance with cooking/housework;Help with stairs or ramp for entrance;Assist for transportation   Equipment Recommendations  Tub/shower seat       Precautions / Restrictions Precautions Precautions: Fall Recall of Precautions/Restrictions: Intact Restrictions LLE Weight Bearing Per Provider Order: Non weight bearing Other Position/Activity Restrictions: s/p L AKA       Mobility Bed Mobility Overal bed mobility: Modified Independent                  Transfers Overall transfer level: Needs assistance Equipment used: Rolling walker (2 wheels) Transfers: Sit to/from Stand Sit to Stand: Supervision                 Balance Overall balance assessment:  Needs assistance Sitting-balance support: No upper extremity supported, Feet supported Sitting balance-Leahy Scale: Good     Standing balance support: Bilateral upper extremity supported, During functional activity, Reliant on assistive device for balance Standing balance-Leahy Scale: Poor Standing balance comment: heavy reliance on rolling walker for support in standing                           ADL either performed or assessed with clinical judgement   ADL Overall ADL's : Needs assistance/impaired                             Toileting- Clothing Manipulation and Hygiene: Contact guard assist;Sit to/from stand              Extremity/Trunk Assessment Upper Extremity Assessment Upper Extremity Assessment: Overall WFL for tasks assessed   Lower Extremity Assessment Lower Extremity Assessment: Generalized weakness;LLE deficits/detail        Vision Patient Visual Report: No change from baseline           Communication Communication Communication: No apparent difficulties   Cognition Arousal: Alert Behavior During Therapy: WFL for tasks assessed/performed Cognition: No apparent impairments                               Following commands: Intact        Cueing   Cueing Techniques: Verbal cues, Tactile cues  Exercises  Pertinent Vitals/ Pain       Pain Assessment Pain Assessment: No/denies pain         Frequency  Min 2X/week        Progress Toward Goals  OT Goals(current goals can now be found in the care plan section)  Progress towards OT goals: Progressing toward goals      AM-PAC OT "6 Clicks" Daily Activity     Outcome Measure   Help from another person eating meals?: None Help from another person taking care of personal grooming?: A Little Help from another person toileting, which includes using toliet, bedpan, or urinal?: A Little Help from another person bathing (including washing,  rinsing, drying)?: A Lot Help from another person to put on and taking off regular upper body clothing?: A Little Help from another person to put on and taking off regular lower body clothing?: A Little 6 Click Score: 18    End of Session Equipment Utilized During Treatment: Rolling walker (2 wheels)  OT Visit Diagnosis: Other abnormalities of gait and mobility (R26.89)   Activity Tolerance Patient tolerated treatment well   Patient Left in bed;with call bell/phone within reach   Nurse Communication Mobility status        Time: 0454-0981 OT Time Calculation (min): 18 min  Charges: OT General Charges $OT Visit: 1 Visit OT Treatments $Self Care/Home Management : 8-22 mins  Jackquline Denmark, MS, OTR/L , CBIS ascom [phone]  02/04/24, 4:40 PM

## 2024-02-04 NOTE — Progress Notes (Signed)
  Progress Note    02/04/2024 11:03 AM 6 Days Post-Op  Subjective:   [name] is a 48 year old male now postop day#6 from a left above-the-knee amputation.  Patient was originally admitted for left below the knee amputation infection.  After workup patient was noted to have considerable osteomyelitis in his tibial bone and therefore required an AKA.   This morning on exam patient was standing at the bedside using crutches to ambulate comfortably.  No distress noted.  Patient does endorse some pain but says it is tolerable at this time.  No other complaints overnight.  Dressings were changed over the weekend and remain clean dry and intact.  Vitals are remained stable.   Vitals:   02/04/24 0357 02/04/24 0919  BP: (!) 152/89 (!) 163/83  Pulse: 71 77  Resp: 20 18  Temp: 98.1 F (36.7 C) 98.1 F (36.7 C)  SpO2: 100% 100%   Physical Exam: Cardiac:  RRR, normal S1 and S2.  No murmurs present. Lungs: Lungs clear on auscultation.  No rales rhonchi or wheezing. Incisions: Left AKA incision with dressing clean dry and intact.  No noted drainage. Extremities: Right lower extremity warm to touch with palpable pulses.  Left lower extremity with new AKA.  Dressings clean dry and intact. Abdomen: Positive bowel sounds throughout, soft, nontender and nondistended. Neurologic: Alert and oriented x 3, answers all questions and follows commands appropriately.  CBC    Component Value Date/Time   WBC 9.7 02/04/2024 0335   RBC 2.99 (L) 02/04/2024 0335   HGB 8.2 (L) 02/04/2024 0335   HCT 24.7 (L) 02/04/2024 0335   PLT 297 02/04/2024 0335   MCV 82.6 02/04/2024 0335   MCH 27.4 02/04/2024 0335   MCHC 33.2 02/04/2024 0335   RDW 15.7 (H) 02/04/2024 0335   LYMPHSABS 1.0 01/23/2024 1429   MONOABS 0.7 01/23/2024 1429   EOSABS 0.0 01/23/2024 1429   BASOSABS 0.0 01/23/2024 1429    BMET    Component Value Date/Time   NA 133 (L) 02/04/2024 0335   K 3.9 02/04/2024 0335   CL 106 02/04/2024  0335   CO2 21 (L) 02/04/2024 0335   GLUCOSE 333 (H) 02/04/2024 0335   BUN 32 (H) 02/04/2024 0335   CREATININE 2.17 (H) 02/04/2024 0335   CALCIUM 8.5 (L) 02/04/2024 0335   GFRNONAA 37 (L) 02/04/2024 0335   GFRAA >60 05/05/2020 1930    INR    Component Value Date/Time   INR 1.1 01/27/2024 0417     Intake/Output Summary (Last 24 hours) at 02/04/2024 1103 Last data filed at 02/04/2024 0745 Gross per 24 hour  Intake --  Output 3425 ml  Net -3425 ml     Assessment/Plan:  48 y.o. male is s/p  left revision of BKA to AKA  6 Days Post-Op   PLAN Okay to discharge to [facility] when available. First dressing change completed by Vascular Surgery over the weekend.    DVT prophylaxis:  Lovenox 40 mg subcu every 24    Richard Mendoza [facility] 02/04/2024 11:03 AM

## 2024-02-04 NOTE — Progress Notes (Signed)
 Physical Therapy Treatment Patient Details Name: Richard Mendoza MRN: [id number] DOB: Dec 28, 1975 Today's Date: 02/04/2024   History of Present Illness Pt is a 48 y.o. male presenting to hospital 01/23/24 with concerns for L BKA infection.  Pt admitted with LLE stump infection, hyponatremia, hypokalemia, uncontrolled DM with hyperglycemia.  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  PMH includes DM on insulin pump, htn, L BKA 2021, ACDF 2018, L RCR, R shoulder arthroscopy with debridement and bicep tenon repair.    PT Comments  Patient is agreeable to PT session. He is motivated to participate with supportive family at bedside. Pain seems well controlled today. Increased ambulation distance this session. Visual and verbal cues for improved gait kinematics for progression of gait distance and to help with pain control with mobility. Reviewed desensitization techniques and stretching of residual limb for eventual prosthesis training. Anticipate patient could benefit from rehabilitation > 3 hours/day at this time. He is hopeful for rehab before going home.    If plan is discharge home, recommend the following: A little help with walking and/or transfers;A little help with bathing/dressing/bathroom;Help with stairs or ramp for entrance;Assist for transportation   Can travel by private vehicle     Yes  Equipment Recommendations   (shower chair)    Recommendations for Other Services Rehab consult     Precautions / Restrictions Precautions Precautions: Fall Restrictions LLE Weight Bearing Per Provider Order: Non weight bearing     Mobility  Bed Mobility Overal bed mobility: Modified Independent                  Transfers Overall transfer level: Needs assistance Equipment used: Rolling walker (2 wheels) Transfers: Sit to/from Stand Sit to Stand: Supervision           General transfer comment: reinforced safe hand placement technique with sitting and  standing with no difficulty with carry over    Ambulation/Gait Ambulation/Gait assistance: Contact guard assist Gait Distance (Feet): 24 Feet Assistive device: Rolling walker (2 wheels)   Gait velocity: decreased     General Gait Details: visual demonstration provided for correct technique, avoiding  forceful hop for pain control. patient demonstrated correct technique. arm fatigue and mild increased pain in the residual limb with increased ambulation distance today   Stairs             Wheelchair Mobility     Tilt Bed    Modified Rankin (Stroke Patients Only)       Balance Overall balance assessment: Needs assistance Sitting-balance support: No upper extremity supported, Feet supported Sitting balance-Leahy Scale: Good     Standing balance support: Bilateral upper extremity supported, During functional activity, Reliant on assistive device for balance Standing balance-Leahy Scale: Poor Standing balance comment: heavy reliance on rolling walker for support in standing                            Communication Communication Communication: No apparent difficulties  Cognition Arousal: Alert Behavior During Therapy: WFL for tasks assessed/performed   PT - Cognitive impairments: No apparent impairments                         Following commands: Intact      Cueing Cueing Techniques: Verbal cues, Tactile cues  Exercises Other Exercises Other Exercises: patient able to complete standing static stretch of left hip flexor in standing with tactile cues  for reinforcement of correct technique using rolling walker for support    General Comments General comments (skin integrity, edema, etc.): education provided on desensitization techniques and patient able to demonstrate correct technique with providing light touch (dressing is still in place)      Pertinent Vitals/Pain Pain Assessment Pain Assessment: Faces Faces Pain Scale: Hurts a little  bit Pain Location: L residual limb with movement Pain Descriptors / Indicators: Tightness Pain Intervention(s): Limited activity within patient's tolerance, Monitored during session, Repositioned    Home Living                          Prior Function            PT Goals (current goals can now be found in the care plan section) Acute Rehab PT Goals Patient Stated Goal: rehab PT Goal Formulation: With patient Time For Goal Achievement: 02/13/24 Potential to Achieve Goals: Good Progress towards PT goals: Progressing toward goals    Frequency    Min 2X/week      PT Plan      Co-evaluation              AM-PAC PT "6 Clicks" Mobility   Outcome Measure  Help needed turning from your back to your side while in a flat bed without using bedrails?: None Help needed moving from lying on your back to sitting on the side of a flat bed without using bedrails?: None Help needed moving to and from a bed to a chair (including a wheelchair)?: A Little Help needed standing up from a chair using your arms (e.g., wheelchair or bedside chair)?: A Little Help needed to walk in hospital room?: A Lot Help needed climbing 3-5 steps with a railing? : Total 6 Click Score: 17    End of Session   Activity Tolerance: Patient tolerated treatment well Patient left: in bed;with call bell/phone within reach;with family/visitor present   PT Visit Diagnosis: Other abnormalities of gait and mobility (R26.89);Unsteadiness on feet (R26.81);Muscle weakness (generalized) (M62.81);History of falling (Z91.81);Pain     Time: 1610-9604 PT Time Calculation (min) (ACUTE ONLY): 14 min  Charges:    $Gait Training: 8-22 mins PT General Charges $$ ACUTE PT VISIT: 1 Visit                     Donna Bernard, PT, MPT   Ina Homes 02/04/2024, 11:08 AM

## 2024-02-04 NOTE — Inpatient Diabetes Management (Signed)
 Inpatient Diabetes Program Recommendations  AACE/ADA: New Consensus Statement on Inpatient Glycemic Control   Target Ranges:  Prepandial:   less than 140 mg/dL      Peak postprandial:   less than 180 mg/dL (1-2 hours)      Critically ill patients:  140 - 180 mg/dL    Latest Reference Range & Units 02/04/24 03:35  Glucose 70 - 99 mg/dL 147 (H)    Latest Reference Range & Units 02/03/24 08:38 02/03/24 12:19 02/03/24 16:48 02/03/24 21:05  Glucose-Capillary 70 - 99 mg/dL 829 (H) 562 (H) 130 (H) 259 (H)   Review of Glycemic Control  Diabetes history: DM2 Outpatient Diabetes medications: Medtronic insulin pump with Fiasp, Dexcom G6 Current orders for Inpatient glycemic control: Lantus 45 units daily, Novolog 8 units TID with meals, Novolog 0-9 units TID with meals, Novolog 0-5 units at bedtime   Inpatient Diabetes Program Recommendations:     Insulin: Lab glucose 333 mg/dl this morning. Please consider increasing Lantus to 50 units daily and meal coverage to Novolog 11 units TID with meals.   Outpatient: Patient has requested Rx for Novolog vials (#[id number]) at discharge.   Thanks, Orlando Penner, RN, MSN, CDCES Diabetes Coordinator Inpatient Diabetes Program [phone] (Team Pager from 8am to 5pm)

## 2024-02-04 NOTE — Assessment & Plan Note (Signed)
 Done on 01/29/2024

## 2024-02-05 DIAGNOSIS — Z89612 Acquired absence of left leg above knee: Secondary | ICD-10-CM | POA: Diagnosis not present

## 2024-02-05 LAB — CBC
HCT: 24.6 % — ABNORMAL LOW (ref 39.0–52.0)
Hemoglobin: 8.1 g/dL — ABNORMAL LOW (ref 13.0–17.0)
MCH: 27.5 pg (ref 26.0–34.0)
MCHC: 32.9 g/dL (ref 30.0–36.0)
MCV: 83.4 fL (ref 80.0–100.0)
Platelets: 297 10*3/uL (ref 150–400)
RBC: 2.95 MIL/uL — ABNORMAL LOW (ref 4.22–5.81)
RDW: 15.8 % — ABNORMAL HIGH (ref 11.5–15.5)
WBC: 9.2 10*3/uL (ref 4.0–10.5)
nRBC: 0 % (ref 0.0–0.2)

## 2024-02-05 LAB — BASIC METABOLIC PANEL
Anion gap: 5 (ref 5–15)
BUN: 31 mg/dL — ABNORMAL HIGH (ref 6–20)
CO2: 25 mmol/L (ref 22–32)
Calcium: 8.3 mg/dL — ABNORMAL LOW (ref 8.9–10.3)
Chloride: 108 mmol/L (ref 98–111)
Creatinine, Ser: 2.09 mg/dL — ABNORMAL HIGH (ref 0.61–1.24)
GFR, Estimated: 39 mL/min — ABNORMAL LOW (ref >60)
Glucose, Bld: 187 mg/dL — ABNORMAL HIGH (ref 70–99)
Potassium: 3.9 mmol/L (ref 3.5–5.1)
Sodium: 138 mmol/L (ref 135–145)

## 2024-02-05 LAB — PHOSPHORUS: Phosphorus: 4 mg/dL (ref 2.5–4.6)

## 2024-02-05 LAB — MAGNESIUM: Magnesium: 2.2 mg/dL (ref 1.7–2.4)

## 2024-02-05 LAB — GLUCOSE, CAPILLARY
Glucose-Capillary: 177 mg/dL — ABNORMAL HIGH (ref 70–99)
Glucose-Capillary: 167 mg/dL — ABNORMAL HIGH (ref 70–99)

## 2024-02-05 MED ORDER — INSULIN ASPART 100 UNIT/ML IJ SOLN: 10.0000 [IU] | Freq: Three times a day (TID) | INTRAMUSCULAR | Status: DC

## 2024-02-05 MED ORDER — INSULIN ASPART 100 UNIT/ML IJ SOLN
0.0000 [IU] | Freq: Three times a day (TID) | INTRAMUSCULAR | Status: DC
Start: 2024-02-05 — End: 2024-03-11

## 2024-02-05 MED ORDER — ENOXAPARIN SODIUM 40 MG/0.4ML IJ SOSY: 40.0000 mg | PREFILLED_SYRINGE | Freq: Every evening | INTRAMUSCULAR | Status: DC

## 2024-02-05 MED ORDER — AMLODIPINE BESYLATE 10 MG PO TABS: 10.0000 mg | ORAL_TABLET | Freq: Every evening | ORAL | Status: DC

## 2024-02-05 MED ORDER — INSULIN GLARGINE 100 UNIT/ML ~~LOC~~ SOLN: 50.0000 [IU] | Freq: Every day | SUBCUTANEOUS | Status: DC

## 2024-02-05 MED ORDER — PREGABALIN 50 MG PO CAPS: 50.0000 mg | ORAL_CAPSULE | Freq: Three times a day (TID) | ORAL | 0 refills | Status: DC

## 2024-02-05 MED ORDER — ACETAMINOPHEN 500 MG PO TABS: 500.0000 mg | ORAL_TABLET | Freq: Three times a day (TID) | ORAL | Status: DC

## 2024-02-05 MED ORDER — ASCORBIC ACID 500 MG PO TABS: 500.0000 mg | ORAL_TABLET | Freq: Every day | ORAL | Status: DC

## 2024-02-05 MED ORDER — INSULIN ASPART 100 UNIT/ML IJ SOLN
10.0000 [IU] | Freq: Three times a day (TID) | INTRAMUSCULAR | Status: DC
  Administered 2024-02-05: 10 [IU] via SUBCUTANEOUS
  Filled 2024-02-05: qty 1

## 2024-02-05 MED ORDER — INSULIN ASPART 100 UNIT/ML IJ SOLN
0.0000 [IU] | Freq: Every day | INTRAMUSCULAR | Status: DC
Start: 2024-02-05 — End: 2024-03-11

## 2024-02-05 MED ORDER — INSULIN ASPART 100 UNIT/ML IJ SOLN
INTRAMUSCULAR | Status: DC
Start: 2024-02-05 — End: 2024-03-11

## 2024-02-05 MED ORDER — SENNOSIDES-DOCUSATE SODIUM 8.6-50 MG PO TABS: 1.0000 | ORAL_TABLET | Freq: Every evening | ORAL | Status: DC | PRN

## 2024-02-05 MED ORDER — HYDROMORPHONE HCL 2 MG PO TABS: 1.0000 mg | ORAL_TABLET | ORAL | 0 refills | Status: DC | PRN

## 2024-02-05 MED ORDER — SODIUM BICARBONATE 650 MG PO TABS: 650.0000 mg | ORAL_TABLET | Freq: Two times a day (BID) | ORAL | Status: DC

## 2024-02-05 MED ORDER — VITAMIN D (ERGOCALCIFEROL) 1.25 MG (50000 UNIT) PO CAPS: 50000.0000 [IU] | ORAL_CAPSULE | ORAL | Status: DC

## 2024-02-05 MED ORDER — METHOCARBAMOL 500 MG PO TABS: 500.0000 mg | ORAL_TABLET | Freq: Three times a day (TID) | ORAL | Status: DC | PRN

## 2024-02-05 MED ORDER — FENOFIBRATE 54 MG PO TABS: 54.0000 mg | ORAL_TABLET | Freq: Every day | ORAL | Status: DC

## 2024-02-05 NOTE — Progress Notes (Signed)
  Progress Note    02/05/2024 12:08 PM 7 Days Post-Op  Subjective:  [name] is a 48 year old male now postop day#7 from a left above-the-knee amputation.  Patient was originally admitted for left below the knee amputation infection.  After workup patient was noted to have considerable osteomyelitis in his tibial bone and therefore required an AKA.   This morning on exam patient was standing at the bedside using crutches to ambulate comfortably.  No distress noted.  Patient does endorse some pain but says it is tolerable at this time.  No other complaints overnight.  Dressings were changed over the weekend and remain clean dry and intact.  Vitals are remained stable.   Vitals:   02/05/24 0414 02/05/24 0951  BP: 121/83 (!) 157/80  Pulse: 65 83  Resp: 20 16  Temp: 97.7 F (36.5 C) 98 F (36.7 C)  SpO2: 99% 99%   Physical Exam: Cardiac:  RRR, normal S1 and S2.  No murmurs present. Lungs: Lungs clear on auscultation.  No rales rhonchi or wheezing. Incisions: Left AKA incision with dressing clean dry and intact.  No noted drainage. Extremities: Right lower extremity warm to touch with palpable pulses.  Left lower extremity with new AKA.  Dressings clean dry and intact. Abdomen: Positive bowel sounds throughout, soft, nontender and nondistended. Neurologic: Alert and oriented x 3, answers all questions and follows commands appropriately.  CBC    Component Value Date/Time   WBC 9.2 02/05/2024 0542   RBC 2.95 (L) 02/05/2024 0542   HGB 8.1 (L) 02/05/2024 0542   HCT 24.6 (L) 02/05/2024 0542   PLT 297 02/05/2024 0542   MCV 83.4 02/05/2024 0542   MCH 27.5 02/05/2024 0542   MCHC 32.9 02/05/2024 0542   RDW 15.8 (H) 02/05/2024 0542   LYMPHSABS 1.0 01/23/2024 1429   MONOABS 0.7 01/23/2024 1429   EOSABS 0.0 01/23/2024 1429   BASOSABS 0.0 01/23/2024 1429    BMET    Component Value Date/Time   NA 138 02/05/2024 0542   K 3.9 02/05/2024 0542   CL 108 02/05/2024 0542   CO2  25 02/05/2024 0542   GLUCOSE 187 (H) 02/05/2024 0542   BUN 31 (H) 02/05/2024 0542   CREATININE 2.09 (H) 02/05/2024 0542   CALCIUM 8.3 (L) 02/05/2024 0542   GFRNONAA 39 (L) 02/05/2024 0542   GFRAA >60 05/05/2020 1930    INR    Component Value Date/Time   INR 1.1 01/27/2024 0417     Intake/Output Summary (Last 24 hours) at 02/05/2024 1208 Last data filed at 02/05/2024 1024 Gross per 24 hour  Intake 500 ml  Output 2550 ml  Net -2050 ml     Assessment/Plan:  48 y.o. male is s/p  left revision of BKA to AKA    PLAN Cleared for discharge to Rehab.  Waiting for placement Will see patient back in clinic for staple removal in 3 weeks.   DVT prophylaxis:  Lovenox 40 mg subcu every 24    [facility] 02/05/2024 12:08 PM

## 2024-02-05 NOTE — Plan of Care (Signed)
  Problem: Education: Goal: Ability to describe self-care measures that may prevent or decrease complications (Diabetes Survival Skills Education) will improve Outcome: Adequate for Discharge Goal: Individualized Educational Video(s) Outcome: Adequate for Discharge   Problem: Coping: Goal: Ability to adjust to condition or change in health will improve Outcome: Adequate for Discharge   Problem: Fluid Volume: Goal: Ability to maintain a balanced intake and output will improve Outcome: Adequate for Discharge   Problem: Health Behavior/Discharge Planning: Goal: Ability to identify and utilize available resources and services will improve Outcome: Adequate for Discharge Goal: Ability to manage health-related needs will improve Outcome: Adequate for Discharge   Problem: Metabolic: Goal: Ability to maintain appropriate glucose levels will improve Outcome: Adequate for Discharge   Problem: Nutritional: Goal: Maintenance of adequate nutrition will improve Outcome: Adequate for Discharge Goal: Progress toward achieving an optimal weight will improve Outcome: Adequate for Discharge   Problem: Skin Integrity: Goal: Risk for impaired skin integrity will decrease Outcome: Adequate for Discharge   Problem: Tissue Perfusion: Goal: Adequacy of tissue perfusion will improve Outcome: Adequate for Discharge   Problem: Education: Goal: Knowledge of General Education information will improve Description: Including pain rating scale, medication(s)/side effects and non-pharmacologic comfort measures Outcome: Adequate for Discharge   Problem: Health Behavior/Discharge Planning: Goal: Ability to manage health-related needs will improve Outcome: Adequate for Discharge   Problem: Clinical Measurements: Goal: Ability to maintain clinical measurements within normal limits will improve Outcome: Adequate for Discharge Goal: Will remain free from infection Outcome: Adequate for Discharge Goal:  Diagnostic test results will improve Outcome: Adequate for Discharge Goal: Respiratory complications will improve Outcome: Adequate for Discharge Goal: Cardiovascular complication will be avoided Outcome: Adequate for Discharge   Problem: Activity: Goal: Risk for activity intolerance will decrease Outcome: Adequate for Discharge   Problem: Nutrition: Goal: Adequate nutrition will be maintained Outcome: Adequate for Discharge   Problem: Coping: Goal: Level of anxiety will decrease Outcome: Adequate for Discharge   Problem: Elimination: Goal: Will not experience complications related to bowel motility Outcome: Adequate for Discharge Goal: Will not experience complications related to urinary retention Outcome: Adequate for Discharge   Problem: Pain Managment: Goal: General experience of comfort will improve and/or be controlled Outcome: Adequate for Discharge   Problem: Safety: Goal: Ability to remain free from injury will improve Outcome: Adequate for Discharge   Problem: Skin Integrity: Goal: Risk for impaired skin integrity will decrease Outcome: Adequate for Discharge   Problem: Clinical Measurements: Goal: Ability to avoid or minimize complications of infection will improve Outcome: Adequate for Discharge   Problem: Skin Integrity: Goal: Skin integrity will improve Outcome: Adequate for Discharge

## 2024-02-05 NOTE — Discharge Summary (Signed)
 Physician Discharge Summary   Patient: Richard Mendoza MRN: [id number]  DOB: May 14, 1976   Admit:     Date of Admission: 01/23/2024 Admitted from: home   Discharge: Date of discharge: 02/05/24 Disposition: Skilled nursing facility Condition at discharge: good  CODE STATUS: FULL CODE     Discharge Physician: Sunnie Nielsen, DO [facility]     PCP: Barbette Reichmann, MD  Recommendations for Outpatient Follow-up:  Follow up with PCP Barbette Reichmann, MD in 1-2 weeks Follow up with vascular surgery as directed in 1 week  Please obtain labs/tests: BMP, CBC in 1 week Please follow up on the following pending results: none  Discharge Instructions     Diet - low sodium heart healthy   Complete by: As directed    Diet Carb Modified   Complete by: As directed    Increase activity slowly   Complete by: As directed          Discharge Diagnoses: Principal Problem:   S/P AKA (above knee amputation), left (HCC) Active Problems:   Uncontrolled diabetes mellitus with hyperglycemia (HCC)   Hyperlipidemia associated with type 2 diabetes mellitus (HCC)   History of depression   S/P BKA (below knee amputation), left (HCC)   Major depressive disorder, recurrent, in remission (HCC)   PAD (peripheral artery disease) (HCC)   Hypokalemia   Hyponatremia   Tobacco use   Overweight (BMI 25.0-29.9)   Cellulitis      Hospital course / significant events:  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 03/06 admitted to hospitalist service. Repeat MRI showing interval increase in subcutaneous fat edema and swelling as well as new abscesses in the anterior aspect. continued chronic appearing proximal tibial osteomyelitis.  Vascular surgery consult. Pt on abx. Eventually decided for AKA  which was done 03/11. ABLA postop complicated by underlying anemia chronic inflammation / CKD, transfused 1 unit PRBC 03/14.   Consultants:  Vascular Surgery   Procedures/Surgeries: 01/29/24: left AKA - Dr Wyn Quaker      ASSESSMENT & PLAN:    Left tibial osteomyelitis and stump abscess, now s/p AKA done on 3/12 Mepilex dressing w/ coban compression wrap. Cleared by vasc sx to dc to SNF and f/u in 1 wk as an out patient. Lyrica 50 mg p.o. 3 times daily    Severe hypokalemia - resolved Potassium repleted. Monitor BMP    Acute kidney injury on CKD 3a - improved  Monitor BMP    Metabolic acidosis due to renal failure - improved Continue oral bicarbonate. Monitor BMP    Hypertension, hydralazine as needed Monitor BP and titrate medications accordingly   Uncontrolled insulin-dependent diabetes mellitus A1c 13.6 suggesting very poor control  Lantus 50 mg daily to titrate up as needed Novolog SSI ac/hs  Resume insulin pump ON SNF DISCHARGE   Tobacco use Counseled on cessation.  Nicotine patch as needed   Major depressive disorder recurrent, in remission  Continue trazodone 100 mg nightly   Vitamin D deficiency: Supplementing  follow with PCP to repeat vitamin D level after 3 to 6 months.   Anemia due to Acute blood loss s/p Left AKA 3/14 Hb 7.1, transfused 1 PRBC Monitor H&H  Iron supplementation    Anemia due to mild iron deficiency and CKD Folic acid and B12 within normal range Transferrin saturation 20% wnl but  iron level is 42 slightly low Started oral iron supplement with vitamin C   No concerns based on BMI: Body mass index is 24.39 kg/m.  Underweight - under 18  overweight - 25 to 29 obese - 30 or more Class 1 obesity: BMI of 30.0 to 34 Class 2 obesity: BMI of 35.0 to 39 Class 3 obesity: BMI of 40.0 to 49 Super Morbid Obesity: BMI 50-59 Super-super Morbid Obesity: BMI 60+ Significantly low or high BMI is associated with higher medical risk.  Weight  management advised as adjunct to other disease management and risk reduction treatments            Discharge Instructions  Allergies as of 02/05/2024       Reactions   Oxycodone Itching        Medication List     STOP taking these medications    benazepril 20 MG tablet Commonly known as: LOTENSIN   cefadroxil 500 MG capsule Commonly known as: DURICEF   chlorhexidine 4 % external liquid Commonly known as: HIBICLENS   mupirocin ointment 2 % Commonly known as: BACTROBAN       TAKE these medications    acetaminophen 500 MG tablet Commonly known as: TYLENOL Take 1 tablet (500 mg total) by mouth 3 (three) times daily.   amLODipine 10 MG tablet Commonly known as: NORVASC Take 1 tablet (10 mg total) by mouth every evening.   ascorbic acid 500 MG tablet Commonly known as: VITAMIN C Take 1 tablet (500 mg total) by mouth daily. Start taking on: February 06, 2024   [employer] Senna 8.6 MG tablet Generic drug: senna Take 1 tablet by mouth 2 (two) times daily as needed for constipation.   Dexcom G6 Sensor Misc SMARTSIG:Topical Every 10 Days   Dexcom G6 Transmitter Misc   enoxaparin 40 MG/0.4ML injection Commonly known as: LOVENOX Inject 0.4 mLs (40 mg total) into the skin every evening.   fenofibrate 54 MG tablet Take 1 tablet (54 mg total) by mouth daily. Start taking on: February 06, 2024 What changed:  medication strength how much to take   Fiasp 100 UNIT/ML Soln Generic drug: Insulin Aspart (w/Niacinamide) Inject into the skin continuous. Via pump   HYDROmorphone 2 MG tablet Commonly known as: DILAUDID Take 0.5-1 tablets (1-2 mg total) by mouth every 4 (four) hours as needed for severe pain (pain score 7-10).   insulin aspart 100 UNIT/ML injection Commonly known as: novoLOG Inject 0-5 Units into the skin at bedtime.   insulin aspart 100 UNIT/ML injection Commonly known as: novoLOG Inject 0-9 Units into the skin 3 (three) times daily with meals. Do  NOT hold if patient is NPO. Sensitive Scale. CBG < 70: Implement Hypoglycemia Standing Orders and refer to Hypoglycemia Standing Orders sidebar report CBG 70 - 120: 0 units CBG 121 - 150: 1 unit CBG 151 - 200: 2 units CBG 201 - 250: 3 units CBG 251 - 300: 5 units CBG 301 - 350: 7 units CBG 351 - 400: 9 units CBG > 400: call MD and obtain STAT lab verification   insulin aspart 100 UNIT/ML injection Commonly known as: novoLOG Inject 10 Units into the skin 3 (three) times daily with meals.   insulin aspart 100 UNIT/ML injection Commonly known as: NovoLOG FOR USE WITH HOME INSULIN PUMP ON DISCHARGE FROM SNF   insulin glargine 100 UNIT/ML injection Commonly known as: LANTUS Inject 0.5 mLs (50 Units total) into the skin daily. Start taking on: February 06, 2024   iron  polysaccharides 150 MG capsule Commonly known as: NIFEREX Take 1 capsule (150 mg total) by mouth daily.   methocarbamol 500 MG tablet Commonly known as: ROBAXIN Take 1 tablet (500 mg total) by mouth every 8 (eight) hours as needed for muscle spasms.   Omnipod 5 DexG7G6 Pods Gen 5 Misc SMARTSIG:1 Device SUB-Q Every Other Day   ondansetron 4 MG tablet Commonly known as: Zofran Take 1 tablet (4 mg total) by mouth every 8 (eight) hours as needed for nausea or vomiting.   pantoprazole 40 MG tablet Commonly known as: PROTONIX Take 40 mg by mouth daily.   pregabalin 50 MG capsule Commonly known as: LYRICA Take 1 capsule (50 mg total) by mouth 3 (three) times daily.   senna-docusate 8.6-50 MG tablet Commonly known as: Senokot-S Take 1 tablet by mouth at bedtime as needed for mild constipation.   sodium bicarbonate 650 MG tablet Take 1 tablet (650 mg total) by mouth 2 (two) times daily.   traZODone 100 MG tablet Commonly known as: DESYREL Take 100 mg by mouth at bedtime.   Vitamin D (Ergocalciferol) 1.25 MG (50000 UNIT) Caps capsule Commonly known as: DRISDOL Take 1 capsule (50,000 Units total) by mouth every 7 (seven)  days. Start taking on: February 11, 2024         Follow-up Information     Barbette Reichmann, MD Follow up.   Specialty: Internal Medicine Why: Hospital follow up Contact information: [address] [place] [address] [phone]         Annice Needy, MD. Schedule an appointment as soon as possible for a visit.   Specialties: Vascular Surgery, Radiology, Interventional Cardiology Why: hospital follow up in 1 week Contact information: [address] [place] [address] [phone]                 Allergies  Allergen Reactions   Oxycodone Itching     Subjective: pt reports feeling well this morning, pain is fairy controlled, he notes he feels much better w/ the dilaudid + the muscle relaxer. Otherwise no concerns, denies chest pain, SOB. He is eating/drinking normally.   Discharge Exam: BP (!) 157/80 (BP Location: Right Arm)   Pulse 83   Temp 98 F (36.7 C) (Oral)   Resp 16   Ht 5\' 10"  (1.778 m)   Wt 77.1 kg   SpO2 99%   BMI 24.39 kg/m  General: Pt is alert, awake, not in acute distress Cardiovascular: RRR, S1/S2 +, no rubs, no gallops Respiratory: CTA bilaterally, no wheezing, no rhonchi Abdominal: Soft, NT, ND, bowel sounds + Extremities: no edema, no cyanosis RLE. LLE AKA bandage is intact and no soiling      The results of significant diagnostics from this hospitalization (including imaging, microbiology, ancillary and laboratory) are listed below for reference.     Microbiology: No results found for this or any previous visit (from the past 240 hours).   Labs: BNP (last 3 results) No results for input(s): "BNP" in the last 8760 hours. Basic Metabolic Panel: Recent Labs  Lab 01/31/24 0454 02/01/24 0436 02/02/24 0433 02/03/24 0640 02/04/24 0335 02/05/24 0542  NA 132* 132* 135 136 133* 138  K 4.0 3.6 3.7 4.2 3.9 3.9  CL 107 111 107 108 106 108  CO2 19* 19* 20* 22 21* 25  GLUCOSE  344* 256* 264* 284* 333* 187*  BUN 42* 32* 32* 32* 32* 31*  CREATININE 2.42* 2.17* 1.98* 2.18* 2.17* 2.09*  CALCIUM 7.8* 7.9* 8.2* 8.6* 8.5* 8.3*  MG 2.5* 2.3  --  2.2 2.1 2.2  PHOS 3.9 3.4  --  3.4 2.8 4.0   Liver Function Tests: No results for input(s): "AST", "ALT", "ALKPHOS", "BILITOT", "PROT", "ALBUMIN" in the last 168 hours. No results for input(s): "LIPASE", "AMYLASE" in the last 168 hours. No results for input(s): "AMMONIA" in the last 168 hours. CBC: Recent Labs  Lab 02/01/24 0436 02/02/24 0433 02/03/24 0640 02/04/24 0335 02/05/24 0542  WBC 8.6 8.2 10.2 9.7 9.2  HGB 8.4* 7.9* 8.5* 8.2* 8.1*  HCT 25.5* 24.3* 25.4* 24.7* 24.6*  MCV 82.3 81.5 81.4 82.6 83.4  PLT 297 262 286 297 297   Cardiac Enzymes: No results for input(s): "CKTOTAL", "CKMB", "CKMBINDEX", "TROPONINI" in the last 168 hours. BNP: Invalid input(s): "POCBNP" CBG: Recent Labs  Lab 02/04/24 0758 02/04/24 1156 02/04/24 1636 02/04/24 2124 02/05/24 0809  GLUCAP 229* 216* 197* 227* 177*   D-Dimer No results for input(s): "DDIMER" in the last 72 hours. Hgb A1c No results for input(s): "HGBA1C" in the last 72 hours. Lipid Profile No results for input(s): "CHOL", "HDL", "LDLCALC", "TRIG", "CHOLHDL", "LDLDIRECT" in the last 72 hours. Thyroid function studies No results for input(s): "TSH", "T4TOTAL", "T3FREE", "THYROIDAB" in the last 72 hours.  Invalid input(s): "FREET3" Anemia work up No results for input(s): "VITAMINB12", "FOLATE", "FERRITIN", "TIBC", "IRON", "RETICCTPCT" in the last 72 hours. Urinalysis    Component Value Date/Time   COLORURINE STRAW (A) 04/30/2023 0540   APPEARANCEUR CLEAR (A) 04/30/2023 0540   LABSPEC 1.015 04/30/2023 0540   PHURINE 6.0 04/30/2023 0540   GLUCOSEU 150 (A) 04/30/2023 0540   HGBUR SMALL (A) 04/30/2023 0540   BILIRUBINUR NEGATIVE 04/30/2023 0540   KETONESUR NEGATIVE 04/30/2023 0540   PROTEINUR 100 (A) 04/30/2023 0540   UROBILINOGEN 0.2 07/31/2011 2217    NITRITE NEGATIVE 04/30/2023 0540   LEUKOCYTESUR NEGATIVE 04/30/2023 0540   Sepsis Labs Recent Labs  Lab 02/02/24 0433 02/03/24 0640 02/04/24 0335 02/05/24 0542  WBC 8.2 10.2 9.7 9.2   Microbiology No results found for this or any previous visit (from the past 240 hours). Imaging MR TIBIA FIBULA LEFT W WO CONTRAST Result Date: 01/24/2024 CLINICAL DATA:  Infection. Patient noticed wound on left leg with pain and swelling. Patient is unable to put on his prosthetic due to the swelling. Left leg is red and swollen. History of below-the-knee amputation of left leg in 2021. History of diabetes EXAM: MRI OF LOWER LEFT EXTREMITY WITHOUT AND WITH CONTRAST TECHNIQUE: Multiplanar, multisequence MR imaging of the left distal femur through proximal tibia was performed both before and after administration of intravenous contrast. CONTRAST:  7mL GADAVIST GADOBUTROL 1 MMOL/ML IV SOLN COMPARISON:  Left tibia and fibula radiographs 01/23/2024, left knee radiographs 08/20/2023, 06/01/2023; MRI left knee without and with contrast 08/20/2023 FINDINGS: Bones/Joint/Cartilage Previously there was scattered decreased T1 and increased T2 signal throughout the majority of the remaining proximal left tibia status post below-the-knee amputation. There are now more distinct decreased T1 and increased T2 signal "channels" or "tracts "extending proximally from the lateral aspect of the distal tibial amputation stump. These are in a similar course compared to the prior scattered edema and more scattered likely bone abscesses previously seen. These channels measure up to approximately 3 cm in transverse dimension and 1.8 cm in AP dimension and extend up to the proximal aspect of the tibial metaphysis along an approximate 8.3 cm craniocaudal length (coronal series 7, image 15). There is marrow edema also extending  more proximally into the tibial spines. The slightly decreased geographic distribution and more defined walls of the signal  abnormality suggest evolving more subacute to chronic osteomyelitis. Again no cortical erosion or signal abnormality to indicate acute osteomyelitis within the distal femur, patella, or proximal fibula. Ligaments The medial collateral ligament and fibular collateral ligament appear intact. The ACL and PCL are grossly intact. Muscles and Tendons There is moderate edema within the anterior proximal calf muscle compartment, mildly decreased from prior. Mild edema within the posterior tibial muscle is decreased. Near resolution of the prior medial head of the gastrocnemius muscle edema. Soft tissues There is metallic susceptibility artifact within the distal left below-the-knee amputation stump and postsurgical change. On 08/20/2023, there was a peripherally enhancing fluid collection/sinus tract extending distally from the distal lateral aspect of the tibial stump distally near the skin surface. This now appears slightly smaller, and is less distinct. However there remains significant decreased T1 and increased T2 signal edema, swelling, and enhancement of the distal tibial subcutaneous fat, and it actually appears increased, with the depth of the subcutaneous fat measuring up to approximately 1.2 to 1.8 cm compared to 0.7 to 1.2 cm in similar regions previously. There is lobular decreased T1 and increased T2 signal that may represent inflammatory phlegmon versus a partially walled-off abscess just distal to the distal tibial amputation stump, overall in a region measuring up to approximately 2.8 x 2.5 x 1.1 cm (transverse by AP by craniocaudal, coronal image 12 and sagittal image 14). There is chronic erosion of the distal lateral aspect of the tibial stump, similar to prior. There is new lobular decreased T1 increased T2 signal pockets of abscesses within the subcutaneous fat anterior to the tibial stump. The greatest focal region of these abscesses is seen bordering the distal anterior tibial stump and measures up  to approximately 2.5 x 1.0 x 3.7 cm (transverse by AP by craniocaudal, axial image 25 and sagittal image 17). Another dominant pocket of abscess is seen just anterior to the tibial tubercle and patellar tendon insertion, measuring up to approximately 1.3 x 0.4 x 1.4 cm (transverse by AP by craniocaudal, axial image 14 and sagittal image 17). No definitive new distal tibial stump cortical erosion is seen. -- A portion of the right calf is included on coronal large field-of-view images. No abnormal marrow signal is seen. Mild lateral knee compartment joint space narrowing and peripheral osteophytosis. IMPRESSION: Compared to 08/20/2023: 1. Interval decreased definition of the previously seen abscess just distal to the lateral aspect of the tibial stump that previously contacted the distal tibial cortex. However, overall there is interval increase in subcutaneous fat edema and swelling bordering the anterior and distal aspects of the distal tibial stump, and there are some new pockets of rim enhancing abscesses within the subcutaneous fat anterior to the remaining proximal tibia. 2. The prior proximal tibial osteomyelitis abnormal signal now appears more well-defined as rim enhancing "channels/tracts" and overall findings appear to represent evolution of the prior acute osteomyelitis into more chronic "channels" of osteomyelitis. While there likely still remains proximal tibial bone infection, this does not appear to have increased in size or extent. No new cortical erosion is seen. 3. Interval decrease in muscle edema and enhancement within the postsurgical left calf. Electronically Signed   By: Neita Garnet M.D.   On: 01/24/2024 09:45   DG Tibia/Fibula Left Result Date: 01/23/2024 CLINICAL DATA:  Wound infection.  Prior BKA. EXAM: LEFT TIBIA AND FIBULA - 2 VIEW COMPARISON:  X-ray  08/20/2023. FINDINGS: Again evidence of below-the-knee amputation. The amputation margin of the tibia and fibula are grossly preserved.  No erosive changes at the margins. Preserved joint spaces of the knee. Trace joint fluid. Hyperostosis along the patella and tibial tubercle. Preserved joint spaces except for hypertrophic degenerative changes of the proximal tibia fibular joint. Osteopenia. IMPRESSION: Surgical changes of previous below the knee amputation. No definite erosive changes at this time. If there is further concern of bone or soft tissue infection additional cross-sectional imaging study could be considered for further sensitivity such as bone scan or MRI. Electronically Signed   By: Karen Kays M.D.   On: 01/23/2024 17:40      Time coordinating discharge: over 30 minutes  SIGNED:  Sunnie Nielsen DO [facility]

## 2024-02-05 NOTE — TOC Transition Note (Signed)
 Transition of Care [facility]) - Discharge Note   Patient Details  Name: Richard Mendoza MRN: [id number] Date of Birth: 1976-08-29  Transition of Care [facility]) CM/SW Contact:  Chapman Fitch, RN Phone Number: 02/05/2024, 2:15 PM   Clinical Narrative:    Notified by [place] at [facility] that Berkley Harvey has been obtained  Patient will DC to: [place]  Anticipated DC date: 02/05/24  Family notified: Mother at bedside Transport by:Mother  Bedside RN to send signed script in discharge packet   Per MD patient ready for DC to . RN, patient, patient's family, and facility notified of DC. Discharge Summary sent to facility. RN given number for report. DC packet on chart.  TOC signing off.          Patient Goals and CMS Choice            Discharge Placement                       Discharge Plan and Services Additional resources added to the After Visit Summary for                                       Social Drivers of Health (SDOH) Interventions SDOH Screenings   Food Insecurity: No Food Insecurity (01/25/2024)  Housing: Unknown (01/25/2024)  Transportation Needs: No Transportation Needs (01/25/2024)  Utilities: Not At Risk (01/25/2024)  Depression (PHQ2-9): Low Risk  (09/24/2023)  Financial Resource Strain: Low Risk  (07/24/2022)   Received from [facility], [facility]  Social Connections: Unknown (03/21/2022)   Received from [facility], [facility]  Stress: No Stress Concern Present (07/24/2022)   Received from [facility], [facility]  Tobacco Use: High Risk (01/29/2024)     Readmission Risk Interventions    01/24/2024   12:35 PM 08/21/2023    9:03 AM  Readmission Risk Prevention Plan  Transportation Screening Complete Complete  PCP or Specialist Appt within 3-5 Days Complete Complete  HRI or Home Care Consult Complete   Social Work Consult for Recovery Care Planning/Counseling Complete Complete  Palliative Care Screening Not Applicable Not  Applicable  Medication Review Oceanographer) Complete Complete

## 2024-02-05 NOTE — Inpatient Diabetes Management (Signed)
 Inpatient Diabetes Program Recommendations  AACE/ADA: New Consensus Statement on Inpatient Glycemic Control  Target Ranges:  Prepandial:   less than 140 mg/dL      Peak postprandial:   less than 180 mg/dL (1-2 hours)      Critically ill patients:  140 - 180 mg/dL    Latest Reference Range & Units 02/04/24 07:58 02/04/24 11:56 02/04/24 16:36 02/04/24 21:24 02/05/24 08:09  Glucose-Capillary 70 - 99 mg/dL 952 (H) 841 (H) 324 (H) 227 (H) 177 (H)   Review of Glycemic Control  Diabetes history: DM2 Outpatient Diabetes medications: Medtronic insulin pump with Fiasp, Dexcom G6 Current orders for Inpatient glycemic control: Lantus 45 units daily, Novolog 8 units TID with meals, Novolog 0-9 units TID with meals, Novolog 0-5 units at bedtime   Inpatient Diabetes Program Recommendations:     Insulin:  Please consider increasing Lantus to 50 units daily and meal coverage to Novolog 10 units TID with meals.   Outpatient: Patient has requested Rx for Novolog vials (#[id number]) at discharge.  Thanks, Orlando Penner, RN, MSN, CDCES Diabetes Coordinator Inpatient Diabetes Program [phone] (Team Pager from 8am to 5pm)

## 2024-02-05 NOTE — Plan of Care (Signed)
  Problem: Nutritional: Goal: Maintenance of adequate nutrition will improve Outcome: Progressing   Problem: Skin Integrity: Goal: Risk for impaired skin integrity will decrease Outcome: Progressing   Problem: Education: Goal: Knowledge of General Education information will improve Description: Including pain rating scale, medication(s)/side effects and non-pharmacologic comfort measures Outcome: Progressing   Problem: Activity: Goal: Risk for activity intolerance will decrease Outcome: Progressing   Problem: Nutrition: Goal: Adequate nutrition will be maintained Outcome: Progressing   Problem: Coping: Goal: Level of anxiety will decrease Outcome: Progressing   Problem: Pain Managment: Goal: General experience of comfort will improve and/or be controlled Outcome: Progressing   Problem: Skin Integrity: Goal: Risk for impaired skin integrity will decrease Outcome: Progressing

## 2024-02-06 DIAGNOSIS — M869 Osteomyelitis, unspecified: Secondary | ICD-10-CM | POA: Diagnosis not present

## 2024-02-06 DIAGNOSIS — D649 Anemia, unspecified: Secondary | ICD-10-CM | POA: Diagnosis not present

## 2024-02-06 DIAGNOSIS — S78112A Complete traumatic amputation at level between left hip and knee, initial encounter: Secondary | ICD-10-CM | POA: Diagnosis not present

## 2024-02-06 DIAGNOSIS — E1165 Type 2 diabetes mellitus with hyperglycemia: Secondary | ICD-10-CM | POA: Diagnosis not present

## 2024-02-06 DIAGNOSIS — M6281 Muscle weakness (generalized): Secondary | ICD-10-CM | POA: Diagnosis not present

## 2024-02-06 DIAGNOSIS — I10 Essential (primary) hypertension: Secondary | ICD-10-CM | POA: Diagnosis not present

## 2024-02-06 DIAGNOSIS — K219 Gastro-esophageal reflux disease without esophagitis: Secondary | ICD-10-CM | POA: Diagnosis not present

## 2024-02-07 DIAGNOSIS — M6281 Muscle weakness (generalized): Secondary | ICD-10-CM | POA: Diagnosis not present

## 2024-02-07 DIAGNOSIS — K219 Gastro-esophageal reflux disease without esophagitis: Secondary | ICD-10-CM | POA: Diagnosis not present

## 2024-02-07 DIAGNOSIS — E1165 Type 2 diabetes mellitus with hyperglycemia: Secondary | ICD-10-CM | POA: Diagnosis not present

## 2024-02-07 DIAGNOSIS — I10 Essential (primary) hypertension: Secondary | ICD-10-CM | POA: Diagnosis not present

## 2024-02-07 DIAGNOSIS — M869 Osteomyelitis, unspecified: Secondary | ICD-10-CM | POA: Diagnosis not present

## 2024-02-07 DIAGNOSIS — S78112A Complete traumatic amputation at level between left hip and knee, initial encounter: Secondary | ICD-10-CM | POA: Diagnosis not present

## 2024-02-07 DIAGNOSIS — D649 Anemia, unspecified: Secondary | ICD-10-CM | POA: Diagnosis not present

## 2024-02-10 ENCOUNTER — Telehealth (INDEPENDENT_AMBULATORY_CARE_PROVIDER_SITE_OTHER): Payer: Self-pay

## 2024-02-10 DIAGNOSIS — S78112A Complete traumatic amputation at level between left hip and knee, initial encounter: Secondary | ICD-10-CM | POA: Diagnosis not present

## 2024-02-10 DIAGNOSIS — M6281 Muscle weakness (generalized): Secondary | ICD-10-CM | POA: Diagnosis not present

## 2024-02-10 DIAGNOSIS — E1165 Type 2 diabetes mellitus with hyperglycemia: Secondary | ICD-10-CM | POA: Diagnosis not present

## 2024-02-10 DIAGNOSIS — M869 Osteomyelitis, unspecified: Secondary | ICD-10-CM | POA: Diagnosis not present

## 2024-02-10 DIAGNOSIS — I10 Essential (primary) hypertension: Secondary | ICD-10-CM | POA: Diagnosis not present

## 2024-02-10 DIAGNOSIS — K219 Gastro-esophageal reflux disease without esophagitis: Secondary | ICD-10-CM | POA: Diagnosis not present

## 2024-02-10 DIAGNOSIS — D649 Anemia, unspecified: Secondary | ICD-10-CM | POA: Diagnosis not present

## 2024-02-10 NOTE — Telephone Encounter (Signed)
 Left on your desk

## 2024-02-10 NOTE — Telephone Encounter (Signed)
 Dawn with [facility] requested for stump protector prescription left AKA. Please Advise

## 2024-02-10 NOTE — Telephone Encounter (Signed)
 sent

## 2024-02-11 ENCOUNTER — Encounter (INDEPENDENT_AMBULATORY_CARE_PROVIDER_SITE_OTHER): Payer: Self-pay | Admitting: Vascular Surgery

## 2024-02-11 ENCOUNTER — Ambulatory Visit (INDEPENDENT_AMBULATORY_CARE_PROVIDER_SITE_OTHER): Admitting: Vascular Surgery

## 2024-02-11 VITALS — BP 159/97 | HR 85 | Resp 18

## 2024-02-11 DIAGNOSIS — Z89612 Acquired absence of left leg above knee: Secondary | ICD-10-CM

## 2024-02-11 DIAGNOSIS — E1142 Type 2 diabetes mellitus with diabetic polyneuropathy: Secondary | ICD-10-CM

## 2024-02-11 NOTE — Assessment & Plan Note (Signed)
 He is 6 days postop so it is too early to remove his staples.  He will cover the wound during physical therapy and his facility has requested a stump protector which is fine.  I have given a prescription for this today.  Follow-up in about 3 weeks to remove staples.

## 2024-02-11 NOTE — Progress Notes (Signed)
 Patient ID: Richard Mendoza, male   DOB: August 17, 1976, 48 y.o.   MRN: [id number]  Chief Complaint  Patient presents with   Routine Post Op    [facility] 1 week follow up    HPI Richard Mendoza is a 48 y.o. male.  Patient follows up 6 days following left above-knee amputation.  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  He is doing well.  His wound is healing well.  He went to rehab and is currently working hard with rehab to get stronger and safer to go home.  He denies any fever or chills.  No significant drainage from the stump.   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;  Surgeon: Linus Galas, DPM;  Location: [facility];  Service: Podiatry;  Laterality: Left;   AMPUTATION Left 11/16/2020   Procedure: AMPUTATION BELOW KNEE;  Surgeon: Annice Needy, MD;  Location: [facility];  Service: Vascular;  Laterality: Left;   AMPUTATION Left 01/29/2024   Procedure: AMPUTATION, ABOVE KNEE;  Surgeon: Annice Needy, MD;  Location: [facility];  Service: General;  Laterality: Left;   ANTERIOR CERVICAL DECOMP/DISCECTOMY FUSION N/A 09/09/2017   Procedure: ANTERIOR CERVICAL DECOMPRESSION/DISCECTOMY FUSION CERVICAL 6- CERVICAL 7;  Surgeon: Coletta Memos, MD;  [facility];  Service: Neurosurgery;  Laterality: N/A;  ANTERIOR CERVICAL DECOMPRESSION/DISCECTOMY FUSION CERVICAL 6- CERVICAL 7   APPENDECTOMY     I & D EXTREMITY Right 10/03/2017   Procedure: IRRIGATION AND DEBRIDEMENT RIGHT WRIST;  Surgeon: Betha Loa, MD;  [facility];  Service: Orthopedics;  Laterality: Right;   I & D EXTREMITY Right 11/26/2018   Procedure: IRRIGATION AND DEBRIDEMENT FASCIA ON RIGHT FOOT;  Surgeon:  Gwyneth Revels, DPM;  Location: [facility];  Service: Podiatry;  Laterality: Right;   INCISION AND DRAINAGE Right 03/06/2019   Procedure: INCISION AND DRAINAGE RIGHT FOOT, WITH 4th RAY AMPUTATION;  Surgeon: Gwyneth Revels, DPM;  Location: [facility];  Service: Podiatry;  Laterality: Right;   INCISION AND DRAINAGE Left 11/07/2020   Procedure: INCISION AND DRAINAGE;  Surgeon: Linus Galas, DPM;  Location: [facility];  Service: Podiatry;  Laterality: Left;   INCISION AND DRAINAGE ABSCESS Left 05/02/2023   Procedure: INCISION AND DRAINAGE ABSCESS LEFT LOWER EXTREMITY;  Surgeon: Annice Needy, MD;  Location: [facility];  Service: Vascular;  Laterality: Left;   IRRIGATION AND DEBRIDEMENT FOOT Left 11/11/2020   Procedure: IRRIGATION AND DEBRIDEMENT FOOT;  Surgeon: Linus Galas, DPM;  Location: [facility];  Service: Podiatry;  Laterality: Left;   METATARSAL HEAD EXCISION Right 05/15/2019   Procedure: OSTECTOMY;MET HEAD 5;  Surgeon: Gwyneth Revels, DPM;  Location: [facility];  Service: Podiatry;  Laterality: Right;   osteomylitis     ROTATOR CUFF REPAIR Left    SHOULDER ARTHROSCOPY WITH DEBRIDEMENT AND BICEP TENDON REPAIR Right 08/27/2023   Procedure: SHOULDER ARTHROSCOPY WITH INCISION AND DRAINAGE;  Surgeon: Signa Kell, MD;  Location: [facility];  Service: Orthopedics;  Laterality: Right;      Allergies  Allergen Reactions   Oxycodone Itching    Current Outpatient Medications  Medication Sig Dispense Refill   acetaminophen (TYLENOL) 500 MG tablet Take 1 tablet (500 mg total) by  mouth 3 (three) times daily.     amLODipine (NORVASC) 10 MG tablet Take 1 tablet (10 mg total) by mouth every evening.     ascorbic acid (VITAMIN C) 500 MG tablet Take 1 tablet (500 mg total) by mouth daily.     benazepril (LOTENSIN) 40 MG tablet Take 40 mg by mouth daily.     Continuous Glucose Sensor (DEXCOM G6 SENSOR) MISC SMARTSIG:Topical Every 10 Days     Continuous Glucose Transmitter (DEXCOM G6 TRANSMITTER) MISC      enoxaparin  (LOVENOX) 40 MG/0.4ML injection Inject 0.4 mLs (40 mg total) into the skin every evening.     fenofibrate 54 MG tablet Take 1 tablet (54 mg total) by mouth daily.     FIASP 100 UNIT/ML SOLN Inject into the skin continuous. Via pump     HYDROmorphone (DILAUDID) 2 MG tablet Take 0.5-1 tablets (1-2 mg total) by mouth every 4 (four) hours as needed for severe pain (pain score 7-10). 30 tablet 0   insulin aspart (NOVOLOG) 100 UNIT/ML injection Inject 0-5 Units into the skin at bedtime.     insulin aspart (NOVOLOG) 100 UNIT/ML injection Inject 0-9 Units into the skin 3 (three) times daily with meals. Do NOT hold if patient is NPO. Sensitive Scale. CBG < 70: Implement Hypoglycemia Standing Orders and refer to Hypoglycemia Standing Orders sidebar report CBG 70 - 120: 0 units CBG 121 - 150: 1 unit CBG 151 - 200: 2 units CBG 201 - 250: 3 units CBG 251 - 300: 5 units CBG 301 - 350: 7 units CBG 351 - 400: 9 units CBG > 400: call MD and obtain STAT lab verification     insulin aspart (NOVOLOG) 100 UNIT/ML injection Inject 10 Units into the skin 3 (three) times daily with meals.     insulin aspart (NOVOLOG) 100 UNIT/ML injection FOR USE WITH HOME INSULIN PUMP ON DISCHARGE FROM SNF     Insulin Disposable Pump (OMNIPOD 5 DEXG7G6 PODS GEN 5) MISC SMARTSIG:1 Device SUB-Q Every Other Day     insulin glargine (LANTUS) 100 UNIT/ML injection Inject 0.5 mLs (50 Units total) into the skin daily.     methocarbamol (ROBAXIN) 500 MG tablet Take 1 tablet (500 mg total) by mouth every 8 (eight) hours as needed for muscle spasms.     pregabalin (LYRICA) 50 MG capsule Take 1 capsule (50 mg total) by mouth 3 (three) times daily. 45 capsule 0   senna-docusate (SENOKOT-S) 8.6-50 MG tablet Take 1 tablet by mouth at bedtime as needed for mild constipation.     sodium bicarbonate 650 MG tablet Take 1 tablet (650 mg total) by mouth 2 (two) times daily.     Vitamin D, Ergocalciferol, (DRISDOL) 1.25 MG (50000 UNIT) CAPS capsule Take 1  capsule (50,000 Units total) by mouth every 7 (seven) days.     [employer] SENNA 8.6 MG tablet Take 1 tablet by mouth 2 (two) times daily as needed for constipation. (Patient not taking: Reported on 01/23/2024)  5   iron polysaccharides (NIFEREX) 150 MG capsule Take 1 capsule (150 mg total) by mouth daily. (Patient not taking: Reported on 01/23/2024) 30 capsule 0   ondansetron (ZOFRAN) 4 MG tablet Take 1 tablet (4 mg total) by mouth every 8 (eight) hours as needed for nausea or vomiting. (Patient not taking: Reported on 01/23/2024) 20 tablet 1   pantoprazole (PROTONIX) 40 MG tablet Take 40 mg by mouth daily. (Patient not taking: Reported on 01/23/2024)     traZODone (DESYREL) 100  MG tablet Take 100 mg by mouth at bedtime. (Patient not taking: Reported on 01/23/2024)     No current facility-administered medications for this visit.        Physical Exam BP (!) 159/97   Pulse 85   Resp 18  Gen:  WD/WN, NAD Skin: incision C/D/I     Assessment/Plan:  S/P AKA (above knee amputation), left (HCC) He is 6 days postop so it is too early to remove his staples.  He will cover the wound during physical therapy and his facility has requested a stump protector which is fine.  I have given a prescription for this today.  Follow-up in about 3 weeks to remove staples.  DM type 2 with diabetic peripheral neuropathy (HCC) blood glucose control important in reducing the progression of atherosclerotic disease. Also, involved in wound healing. On appropriate medications.      Festus Barren 02/11/2024, 2:14 PM   This note was created with Dragon medical transcription system.  Any errors from dictation are unintentional.

## 2024-02-11 NOTE — Assessment & Plan Note (Signed)
 blood glucose control important in reducing the progression of atherosclerotic disease. Also, involved in wound healing. On appropriate medications.

## 2024-02-12 DIAGNOSIS — S78112A Complete traumatic amputation at level between left hip and knee, initial encounter: Secondary | ICD-10-CM | POA: Diagnosis not present

## 2024-02-12 DIAGNOSIS — N189 Chronic kidney disease, unspecified: Secondary | ICD-10-CM | POA: Diagnosis not present

## 2024-02-12 DIAGNOSIS — I10 Essential (primary) hypertension: Secondary | ICD-10-CM | POA: Diagnosis not present

## 2024-02-12 DIAGNOSIS — D649 Anemia, unspecified: Secondary | ICD-10-CM | POA: Diagnosis not present

## 2024-02-12 DIAGNOSIS — R4586 Emotional lability: Secondary | ICD-10-CM | POA: Diagnosis not present

## 2024-02-12 DIAGNOSIS — K219 Gastro-esophageal reflux disease without esophagitis: Secondary | ICD-10-CM | POA: Diagnosis not present

## 2024-02-12 DIAGNOSIS — N179 Acute kidney failure, unspecified: Secondary | ICD-10-CM | POA: Diagnosis not present

## 2024-02-12 DIAGNOSIS — E1165 Type 2 diabetes mellitus with hyperglycemia: Secondary | ICD-10-CM | POA: Diagnosis not present

## 2024-02-12 DIAGNOSIS — M6281 Muscle weakness (generalized): Secondary | ICD-10-CM | POA: Diagnosis not present

## 2024-02-14 DIAGNOSIS — S78112A Complete traumatic amputation at level between left hip and knee, initial encounter: Secondary | ICD-10-CM | POA: Diagnosis not present

## 2024-02-14 DIAGNOSIS — R809 Proteinuria, unspecified: Secondary | ICD-10-CM | POA: Diagnosis not present

## 2024-02-14 DIAGNOSIS — F329 Major depressive disorder, single episode, unspecified: Secondary | ICD-10-CM | POA: Diagnosis not present

## 2024-02-14 DIAGNOSIS — M869 Osteomyelitis, unspecified: Secondary | ICD-10-CM | POA: Diagnosis not present

## 2024-02-14 DIAGNOSIS — E1165 Type 2 diabetes mellitus with hyperglycemia: Secondary | ICD-10-CM | POA: Diagnosis not present

## 2024-02-14 DIAGNOSIS — D631 Anemia in chronic kidney disease: Secondary | ICD-10-CM | POA: Diagnosis not present

## 2024-02-14 DIAGNOSIS — K219 Gastro-esophageal reflux disease without esophagitis: Secondary | ICD-10-CM | POA: Diagnosis not present

## 2024-02-14 DIAGNOSIS — D649 Anemia, unspecified: Secondary | ICD-10-CM | POA: Diagnosis not present

## 2024-02-14 DIAGNOSIS — N2581 Secondary hyperparathyroidism of renal origin: Secondary | ICD-10-CM | POA: Diagnosis not present

## 2024-02-14 DIAGNOSIS — E1122 Type 2 diabetes mellitus with diabetic chronic kidney disease: Secondary | ICD-10-CM | POA: Diagnosis not present

## 2024-02-14 DIAGNOSIS — I129 Hypertensive chronic kidney disease with stage 1 through stage 4 chronic kidney disease, or unspecified chronic kidney disease: Secondary | ICD-10-CM | POA: Diagnosis not present

## 2024-02-14 DIAGNOSIS — I10 Essential (primary) hypertension: Secondary | ICD-10-CM | POA: Diagnosis not present

## 2024-02-14 DIAGNOSIS — M6281 Muscle weakness (generalized): Secondary | ICD-10-CM | POA: Diagnosis not present

## 2024-02-14 DIAGNOSIS — N1832 Chronic kidney disease, stage 3b: Secondary | ICD-10-CM | POA: Diagnosis not present

## 2024-02-18 DIAGNOSIS — F329 Major depressive disorder, single episode, unspecified: Secondary | ICD-10-CM | POA: Diagnosis not present

## 2024-02-18 DIAGNOSIS — S78112A Complete traumatic amputation at level between left hip and knee, initial encounter: Secondary | ICD-10-CM | POA: Diagnosis not present

## 2024-02-18 DIAGNOSIS — E1165 Type 2 diabetes mellitus with hyperglycemia: Secondary | ICD-10-CM | POA: Diagnosis not present

## 2024-02-18 DIAGNOSIS — D649 Anemia, unspecified: Secondary | ICD-10-CM | POA: Diagnosis not present

## 2024-02-18 DIAGNOSIS — M869 Osteomyelitis, unspecified: Secondary | ICD-10-CM | POA: Diagnosis not present

## 2024-02-18 DIAGNOSIS — K219 Gastro-esophageal reflux disease without esophagitis: Secondary | ICD-10-CM | POA: Diagnosis not present

## 2024-02-18 DIAGNOSIS — M6281 Muscle weakness (generalized): Secondary | ICD-10-CM | POA: Diagnosis not present

## 2024-02-18 DIAGNOSIS — I10 Essential (primary) hypertension: Secondary | ICD-10-CM | POA: Diagnosis not present

## 2024-02-18 DIAGNOSIS — R269 Unspecified abnormalities of gait and mobility: Secondary | ICD-10-CM | POA: Diagnosis not present

## 2024-02-21 DIAGNOSIS — S78112A Complete traumatic amputation at level between left hip and knee, initial encounter: Secondary | ICD-10-CM | POA: Diagnosis not present

## 2024-02-21 DIAGNOSIS — I10 Essential (primary) hypertension: Secondary | ICD-10-CM | POA: Diagnosis not present

## 2024-02-21 DIAGNOSIS — D649 Anemia, unspecified: Secondary | ICD-10-CM | POA: Diagnosis not present

## 2024-02-21 DIAGNOSIS — M6281 Muscle weakness (generalized): Secondary | ICD-10-CM | POA: Diagnosis not present

## 2024-02-21 DIAGNOSIS — F329 Major depressive disorder, single episode, unspecified: Secondary | ICD-10-CM | POA: Diagnosis not present

## 2024-02-21 DIAGNOSIS — E1165 Type 2 diabetes mellitus with hyperglycemia: Secondary | ICD-10-CM | POA: Diagnosis not present

## 2024-02-21 DIAGNOSIS — M869 Osteomyelitis, unspecified: Secondary | ICD-10-CM | POA: Diagnosis not present

## 2024-02-21 DIAGNOSIS — K219 Gastro-esophageal reflux disease without esophagitis: Secondary | ICD-10-CM | POA: Diagnosis not present

## 2024-02-24 DIAGNOSIS — I10 Essential (primary) hypertension: Secondary | ICD-10-CM | POA: Diagnosis not present

## 2024-02-24 DIAGNOSIS — R112 Nausea with vomiting, unspecified: Secondary | ICD-10-CM | POA: Diagnosis not present

## 2024-02-24 DIAGNOSIS — E1165 Type 2 diabetes mellitus with hyperglycemia: Secondary | ICD-10-CM | POA: Diagnosis not present

## 2024-02-24 DIAGNOSIS — Z794 Long term (current) use of insulin: Secondary | ICD-10-CM | POA: Diagnosis not present

## 2024-02-24 DIAGNOSIS — R2241 Localized swelling, mass and lump, right lower limb: Secondary | ICD-10-CM | POA: Diagnosis not present

## 2024-02-24 DIAGNOSIS — F419 Anxiety disorder, unspecified: Secondary | ICD-10-CM | POA: Diagnosis not present

## 2024-02-24 DIAGNOSIS — G479 Sleep disorder, unspecified: Secondary | ICD-10-CM | POA: Diagnosis not present

## 2024-02-24 DIAGNOSIS — S88912S Complete traumatic amputation of left lower leg, level unspecified, sequela: Secondary | ICD-10-CM | POA: Diagnosis not present

## 2024-02-24 DIAGNOSIS — K219 Gastro-esophageal reflux disease without esophagitis: Secondary | ICD-10-CM | POA: Diagnosis not present

## 2024-02-25 DIAGNOSIS — F329 Major depressive disorder, single episode, unspecified: Secondary | ICD-10-CM | POA: Diagnosis not present

## 2024-02-25 DIAGNOSIS — D649 Anemia, unspecified: Secondary | ICD-10-CM | POA: Diagnosis not present

## 2024-02-25 DIAGNOSIS — F331 Major depressive disorder, recurrent, moderate: Secondary | ICD-10-CM | POA: Diagnosis not present

## 2024-02-25 DIAGNOSIS — F413 Other mixed anxiety disorders: Secondary | ICD-10-CM | POA: Diagnosis not present

## 2024-02-25 DIAGNOSIS — M6281 Muscle weakness (generalized): Secondary | ICD-10-CM | POA: Diagnosis not present

## 2024-02-25 DIAGNOSIS — E1165 Type 2 diabetes mellitus with hyperglycemia: Secondary | ICD-10-CM | POA: Diagnosis not present

## 2024-02-25 DIAGNOSIS — K219 Gastro-esophageal reflux disease without esophagitis: Secondary | ICD-10-CM | POA: Diagnosis not present

## 2024-02-25 DIAGNOSIS — I10 Essential (primary) hypertension: Secondary | ICD-10-CM | POA: Diagnosis not present

## 2024-02-25 DIAGNOSIS — S78112A Complete traumatic amputation at level between left hip and knee, initial encounter: Secondary | ICD-10-CM | POA: Diagnosis not present

## 2024-02-25 DIAGNOSIS — M869 Osteomyelitis, unspecified: Secondary | ICD-10-CM | POA: Diagnosis not present

## 2024-02-25 DIAGNOSIS — F4320 Adjustment disorder, unspecified: Secondary | ICD-10-CM | POA: Diagnosis not present

## 2024-02-25 DIAGNOSIS — F5105 Insomnia due to other mental disorder: Secondary | ICD-10-CM | POA: Diagnosis not present

## 2024-02-28 DIAGNOSIS — K219 Gastro-esophageal reflux disease without esophagitis: Secondary | ICD-10-CM | POA: Diagnosis not present

## 2024-02-28 DIAGNOSIS — E1165 Type 2 diabetes mellitus with hyperglycemia: Secondary | ICD-10-CM | POA: Diagnosis not present

## 2024-02-28 DIAGNOSIS — S78112A Complete traumatic amputation at level between left hip and knee, initial encounter: Secondary | ICD-10-CM | POA: Diagnosis not present

## 2024-02-28 DIAGNOSIS — D649 Anemia, unspecified: Secondary | ICD-10-CM | POA: Diagnosis not present

## 2024-02-28 DIAGNOSIS — M869 Osteomyelitis, unspecified: Secondary | ICD-10-CM | POA: Diagnosis not present

## 2024-02-28 DIAGNOSIS — I10 Essential (primary) hypertension: Secondary | ICD-10-CM | POA: Diagnosis not present

## 2024-02-28 DIAGNOSIS — M6281 Muscle weakness (generalized): Secondary | ICD-10-CM | POA: Diagnosis not present

## 2024-02-28 DIAGNOSIS — F329 Major depressive disorder, single episode, unspecified: Secondary | ICD-10-CM | POA: Diagnosis not present

## 2024-03-02 DIAGNOSIS — R269 Unspecified abnormalities of gait and mobility: Secondary | ICD-10-CM | POA: Diagnosis not present

## 2024-03-03 ENCOUNTER — Encounter (INDEPENDENT_AMBULATORY_CARE_PROVIDER_SITE_OTHER): Payer: Self-pay | Admitting: Nurse Practitioner

## 2024-03-03 ENCOUNTER — Ambulatory Visit (INDEPENDENT_AMBULATORY_CARE_PROVIDER_SITE_OTHER): Admitting: Nurse Practitioner

## 2024-03-03 VITALS — BP 142/84 | HR 77 | Resp 18 | Ht 70.0 in | Wt 170.0 lb

## 2024-03-03 DIAGNOSIS — K219 Gastro-esophageal reflux disease without esophagitis: Secondary | ICD-10-CM | POA: Diagnosis not present

## 2024-03-03 DIAGNOSIS — M869 Osteomyelitis, unspecified: Secondary | ICD-10-CM | POA: Diagnosis not present

## 2024-03-03 DIAGNOSIS — S78112A Complete traumatic amputation at level between left hip and knee, initial encounter: Secondary | ICD-10-CM | POA: Diagnosis not present

## 2024-03-03 DIAGNOSIS — F329 Major depressive disorder, single episode, unspecified: Secondary | ICD-10-CM | POA: Diagnosis not present

## 2024-03-03 DIAGNOSIS — M6281 Muscle weakness (generalized): Secondary | ICD-10-CM | POA: Diagnosis not present

## 2024-03-03 DIAGNOSIS — D649 Anemia, unspecified: Secondary | ICD-10-CM | POA: Diagnosis not present

## 2024-03-03 DIAGNOSIS — E1165 Type 2 diabetes mellitus with hyperglycemia: Secondary | ICD-10-CM | POA: Diagnosis not present

## 2024-03-03 DIAGNOSIS — I10 Essential (primary) hypertension: Secondary | ICD-10-CM | POA: Diagnosis not present

## 2024-03-03 DIAGNOSIS — Z89612 Acquired absence of left leg above knee: Secondary | ICD-10-CM

## 2024-03-03 NOTE — Patient Instructions (Addendum)
 Majority of staples removed today, there is a an area of significant blood and scabbing in medial portion.  Recommend soaking with warm compress daily to help break up old blood.  Cover with xeroform and gauze daily.  Overall progressing well. Return in 1-2 weeks for further

## 2024-03-04 NOTE — Progress Notes (Signed)
 Subjective:    Patient ID: Richard Mendoza, male    DOB: 1976/11/18, 48 y.o.   MRN: [id number] Chief Complaint  Patient presents with   Follow-up    3 weeks staple removal    The patient presents today for staple removal after left above-knee amputation on 01/29/2024.  This was a revision from his below-knee amputation.  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  He has been tolerating the revision well.  He has no additional open wounds or ulcerations.    Review of Systems  Skin:  Positive for wound.  All other systems reviewed and are negative.      Objective:   Physical Exam Vitals reviewed.  HENT:     Head: Normocephalic.  Cardiovascular:     Rate and Rhythm: Normal rate.  Pulmonary:     Effort: Pulmonary effort is normal.  Skin:    General: Skin is warm and dry.  Neurological:     Mental Status: He is alert and oriented to person, place, and time.     Gait: Gait abnormal.  Psychiatric:        Mood and Affect: Mood normal.        Behavior: Behavior normal.        Thought Content: Thought content normal.        Judgment: Judgment normal.     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    Social History   Socioeconomic History   Marital status: Married    Spouse name: Not on file   Number of children: 4   Years of education: Not on file   Highest education level: Not on file  Occupational History   Not on file  Tobacco Use   Smoking status: Some Days    Current packs/day: 0.00    Average packs/day: 1 pack/day for 17.0 years (17.0 ttl pk-yrs)    Types: Cigarettes    Start date: 01/14/2002    Last attempt to quit: 01/14/2019    Years since quitting: 5.1   Smokeless  tobacco: Never  Vaping Use   Vaping status: Never Used  Substance and Sexual Activity   Alcohol use: Yes    Comment: rare   Drug use: No   Sexual activity: Yes  Other Topics Concern   Not on file  Social History Narrative   Not on file   Social Drivers of Health   Financial Resource Strain: Low Risk  (07/24/2022)   Received from [facility], [facility]   Overall Financial Resource Strain (CARDIA)    Difficulty of Paying Living Expenses: Not hard at all  Food Insecurity: No Food Insecurity (01/25/2024)   Hunger Vital Sign    Worried About Running Out of Food in the Last Year: Never true    Ran Out of Food in the Last Year: Never true  Transportation Needs: No Transportation Needs (01/25/2024)   PRAPARE - Administrator, Civil Service (Medical): No    Lack of Transportation (Non-Medical): No  Physical Activity: Not on file  Stress: No Stress Concern Present (07/24/2022)   Received from [facility], [facility]  [employer] of Occupational Health - Occupational Stress Questionnaire    Feeling of Stress : Not at all  Social Connections: Unknown (03/21/2022)   Received from [facility], [facility]   Social Network    Social Network: Not on file  Intimate Partner Violence: Not At Risk (01/25/2024)   Humiliation, Afraid, Rape, and Kick questionnaire    Fear of Current or Ex-Partner: No    Emotionally Abused: No    Physically Abused: No    Sexually Abused: No    Past Surgical History:  Procedure Laterality Date   AMPUTATION Left 11/07/2020   Procedure: 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;  Surgeon: Linus Galas, DPM;  Location: [facility];  Service: Podiatry;  Laterality: Left;   AMPUTATION Left 11/16/2020   Procedure: AMPUTATION BELOW KNEE;  Surgeon: Annice Needy, MD;  Location: [facility];  Service: Vascular;  Laterality: Left;   AMPUTATION Left 01/29/2024   Procedure: AMPUTATION, ABOVE KNEE;  Surgeon: Annice Needy, MD;  Location: [facility];   Service: General;  Laterality: Left;   ANTERIOR CERVICAL DECOMP/DISCECTOMY FUSION N/A 09/09/2017   Procedure: ANTERIOR CERVICAL DECOMPRESSION/DISCECTOMY FUSION CERVICAL 6- CERVICAL 7;  Surgeon: Coletta Memos, MD;  Location: [facility];  Service: Neurosurgery;  Laterality: N/A;  ANTERIOR CERVICAL DECOMPRESSION/DISCECTOMY FUSION CERVICAL 6- CERVICAL 7   APPENDECTOMY     I & D EXTREMITY Right 10/03/2017   Procedure: IRRIGATION AND DEBRIDEMENT RIGHT WRIST;  Surgeon: Betha Loa, MD;  Location: [facility];  Service: Orthopedics;  Laterality: Right;   I & D EXTREMITY Right 11/26/2018   Procedure: IRRIGATION AND DEBRIDEMENT FASCIA ON RIGHT FOOT;  Surgeon: Gwyneth Revels, DPM;  Location: [facility];  Service: Podiatry;  Laterality: Right;   INCISION AND DRAINAGE Right 03/06/2019   Procedure: INCISION AND DRAINAGE RIGHT FOOT, WITH 4th RAY AMPUTATION;  Surgeon: Gwyneth Revels, DPM;  Location: [facility];  Service: Podiatry;  Laterality: Right;   INCISION AND DRAINAGE Left 11/07/2020   Procedure: INCISION AND DRAINAGE;  Surgeon: Linus Galas, DPM;  Location: [facility];  Service: Podiatry;  Laterality: Left;   INCISION AND DRAINAGE ABSCESS Left 05/02/2023   Procedure: INCISION AND DRAINAGE ABSCESS LEFT LOWER EXTREMITY;  Surgeon: Annice Needy, MD;  Location: [facility];  Service: Vascular;  Laterality: Left;   IRRIGATION AND DEBRIDEMENT FOOT Left 11/11/2020   Procedure: IRRIGATION AND DEBRIDEMENT FOOT;  Surgeon: Linus Galas, DPM;  Location: [facility];  Service: Podiatry;  Laterality: Left;   METATARSAL HEAD EXCISION Right 05/15/2019   Procedure: OSTECTOMY;MET HEAD 5;  Surgeon: Gwyneth Revels, DPM;  Location: [facility];  Service: Podiatry;  Laterality: Right;   osteomylitis     ROTATOR CUFF REPAIR Left    SHOULDER ARTHROSCOPY WITH DEBRIDEMENT AND BICEP TENDON REPAIR Right 08/27/2023   Procedure: SHOULDER ARTHROSCOPY WITH INCISION AND DRAINAGE;  Surgeon: Signa Kell, MD;  Location: [facility];  Service: Orthopedics;  Laterality: Right;     Family History  Problem Relation Age of Onset   Diabetes Mother    Heart disease Father    Diabetes Father    Arthritis Other    Hyperlipidemia Other    Hypertension Other    Cancer Other        breast   Mental illness Neg Hx     Allergies  Allergen Reactions   Oxycodone Itching       Latest Ref Rng & Units 02/05/2024    5:42 AM 02/04/2024    3:35 AM 02/03/2024    6:40 AM  CBC  WBC 4.0 - 10.5 K/uL 9.2  9.7  10.2   Hemoglobin 13.0 - 17.0 g/dL 8.1  8.2  8.5   Hematocrit 39.0 - 52.0 % 24.6  24.7  25.4   Platelets 150 - 400 K/uL 297  297  286       CMP     Component Value Date/Time   NA 138 02/05/2024 0542   K 3.9 02/05/2024 0542   CL 108 02/05/2024 0542   CO2 25 02/05/2024 0542   GLUCOSE 187 (H) 02/05/2024 0542   BUN 31 (H) 02/05/2024 0542   CREATININE 2.09 (H) 02/05/2024 0542   CALCIUM 8.3 (L) 02/05/2024 0542   PROT 6.1 (L) 01/26/2024 0509   ALBUMIN 2.1 (L) 01/26/2024 0509   AST 12 (L) 01/26/2024 0509   ALT 11 01/26/2024 0509   ALKPHOS 73 01/26/2024 0509   BILITOT 0.6 01/26/2024 0509   GFR 118.24 10/24/2011 1654   GFRNONAA 39 (L) 02/05/2024 0542     No results found.     Assessment & Plan:   1. S/P AKA (above knee amputation), left (HCC) (Primary) Half of the patient's staples were removed today.  He had a lot of dried blood and scab near the medial portion and so he has been instructed to utilize warm compresses to try to help break this up prior to his follow-up visit.  Otherwise we will have him return in about 1 to 2 weeks for further staple removal.   Current Outpatient Medications on File Prior to Visit  Medication Sig Dispense Refill   acetaminophen (TYLENOL) 500 MG tablet Take 1 tablet (500 mg total) by mouth 3 (three) times daily.     amLODipine (NORVASC) 10 MG tablet Take 1 tablet (10 mg total) by mouth every evening.     ascorbic acid (VITAMIN C) 500 MG tablet Take 1 tablet (500 mg total) by mouth daily.     benazepril (LOTENSIN) 40 MG  tablet Take 40 mg by mouth daily.     buPROPion (WELLBUTRIN XL) 150 MG 24 hr tablet Take 150 mg by mouth daily.     enoxaparin (LOVENOX) 40 MG/0.4ML injection Inject 0.4 mLs (40 mg total) into the skin every evening.     fenofibrate 54 MG tablet Take 1 tablet (54 mg total) by mouth daily.     HYDROmorphone (DILAUDID) 2 MG tablet Take 0.5-1 tablets (1-2 mg total) by mouth every 4 (four) hours as needed for severe pain (pain score 7-10). 30 tablet 0   insulin aspart (NOVOLOG) 100 UNIT/ML injection Inject 0-5 Units into the skin at bedtime.     insulin aspart (NOVOLOG) 100 UNIT/ML injection Inject 0-9 Units into the skin 3 (three) times daily with meals. Do NOT hold if patient is NPO. Sensitive Scale. CBG < 70: Implement Hypoglycemia Standing Orders and refer to Hypoglycemia Standing Orders sidebar report CBG 70 - 120: 0 units CBG 121 - 150: 1 unit CBG 151 - 200: 2 units CBG 201 - 250: 3 units CBG 251 - 300: 5 units CBG 301 - 350: 7 units CBG 351 - 400: 9 units CBG > 400: call MD and obtain STAT lab verification     insulin glargine (LANTUS) 100 UNIT/ML injection Inject 0.5 mLs (50 Units total) into the skin daily.     iron polysaccharides (NIFEREX) 150 MG capsule Take 1 capsule (150 mg total) by mouth daily. 30 capsule 0   methocarbamol (ROBAXIN) 500 MG tablet Take 1 tablet (500 mg total) by mouth every  8 (eight) hours as needed for muscle spasms.     omeprazole (PRILOSEC) 20 MG capsule Take 20 mg by mouth daily.     ondansetron (ZOFRAN) 4 MG tablet Take 1 tablet (4 mg total) by mouth every 8 (eight) hours as needed for nausea or vomiting. (Patient taking differently: Take 4 mg by mouth as needed for nausea or vomiting.) 20 tablet 1   pantoprazole (PROTONIX) 40 MG tablet Take 40 mg by mouth daily.     pregabalin (LYRICA) 50 MG capsule Take 1 capsule (50 mg total) by mouth 3 (three) times daily. 45 capsule 0   sodium bicarbonate 650 MG tablet Take 1 tablet (650 mg total) by mouth 2 (two) times daily.      traZODone (DESYREL) 100 MG tablet Take 100 mg by mouth at bedtime.     Continuous Glucose Sensor (DEXCOM G6 SENSOR) MISC SMARTSIG:Topical Every 10 Days (Patient not taking: Reported on 03/03/2024)     Continuous Glucose Transmitter (DEXCOM G6 TRANSMITTER) MISC  (Patient not taking: Reported on 03/03/2024)     CVS SENNA 8.6 MG tablet Take 1 tablet by mouth 2 (two) times daily as needed for constipation. (Patient not taking: Reported on 01/23/2024)  5   FIASP 100 UNIT/ML SOLN Inject into the skin continuous. Via pump (Patient not taking: Reported on 03/03/2024)     insulin aspart (NOVOLOG) 100 UNIT/ML injection Inject 10 Units into the skin 3 (three) times daily with meals.     insulin aspart (NOVOLOG) 100 UNIT/ML injection FOR USE WITH HOME INSULIN PUMP ON DISCHARGE FROM SNF     Insulin Disposable Pump (OMNIPOD 5 DEXG7G6 PODS GEN 5) MISC SMARTSIG:1 Device SUB-Q Every Other Day (Patient not taking: Reported on 03/03/2024)     senna-docusate (SENOKOT-S) 8.6-50 MG tablet Take 1 tablet by mouth at bedtime as needed for mild constipation.     Vitamin D, Ergocalciferol, (DRISDOL) 1.25 MG (50000 UNIT) CAPS capsule Take 1 capsule (50,000 Units total) by mouth every 7 (seven) days. (Patient not taking: Reported on 03/03/2024)     No current facility-administered medications on file prior to visit.    Patient Instructions  Majority of staples removed today, there is a an area of significant blood and scabbing in medial portion.  Recommend soaking with warm compress daily to help break up old blood.  Cover with xeroform and gauze daily.  Overall progressing well. Return in 1-2 weeks for further  No follow-ups on file.   Juda Lajeunesse E Bearett Porcaro, NP

## 2024-03-06 DIAGNOSIS — E1165 Type 2 diabetes mellitus with hyperglycemia: Secondary | ICD-10-CM | POA: Diagnosis not present

## 2024-03-06 DIAGNOSIS — S78112A Complete traumatic amputation at level between left hip and knee, initial encounter: Secondary | ICD-10-CM | POA: Diagnosis not present

## 2024-03-06 DIAGNOSIS — M6281 Muscle weakness (generalized): Secondary | ICD-10-CM | POA: Diagnosis not present

## 2024-03-06 DIAGNOSIS — F329 Major depressive disorder, single episode, unspecified: Secondary | ICD-10-CM | POA: Diagnosis not present

## 2024-03-06 DIAGNOSIS — M25561 Pain in right knee: Secondary | ICD-10-CM | POA: Diagnosis not present

## 2024-03-06 DIAGNOSIS — I10 Essential (primary) hypertension: Secondary | ICD-10-CM | POA: Diagnosis not present

## 2024-03-06 DIAGNOSIS — M869 Osteomyelitis, unspecified: Secondary | ICD-10-CM | POA: Diagnosis not present

## 2024-03-06 DIAGNOSIS — D649 Anemia, unspecified: Secondary | ICD-10-CM | POA: Diagnosis not present

## 2024-03-06 DIAGNOSIS — K219 Gastro-esophageal reflux disease without esophagitis: Secondary | ICD-10-CM | POA: Diagnosis not present

## 2024-03-09 DIAGNOSIS — H5213 Myopia, bilateral: Secondary | ICD-10-CM | POA: Diagnosis not present

## 2024-03-10 DIAGNOSIS — E1165 Type 2 diabetes mellitus with hyperglycemia: Secondary | ICD-10-CM | POA: Diagnosis not present

## 2024-03-10 DIAGNOSIS — K219 Gastro-esophageal reflux disease without esophagitis: Secondary | ICD-10-CM | POA: Diagnosis not present

## 2024-03-10 DIAGNOSIS — M869 Osteomyelitis, unspecified: Secondary | ICD-10-CM | POA: Diagnosis not present

## 2024-03-10 DIAGNOSIS — M25561 Pain in right knee: Secondary | ICD-10-CM | POA: Diagnosis not present

## 2024-03-10 DIAGNOSIS — D649 Anemia, unspecified: Secondary | ICD-10-CM | POA: Diagnosis not present

## 2024-03-10 DIAGNOSIS — S78112A Complete traumatic amputation at level between left hip and knee, initial encounter: Secondary | ICD-10-CM | POA: Diagnosis not present

## 2024-03-10 DIAGNOSIS — I10 Essential (primary) hypertension: Secondary | ICD-10-CM | POA: Diagnosis not present

## 2024-03-10 DIAGNOSIS — F329 Major depressive disorder, single episode, unspecified: Secondary | ICD-10-CM | POA: Diagnosis not present

## 2024-03-10 DIAGNOSIS — M6281 Muscle weakness (generalized): Secondary | ICD-10-CM | POA: Diagnosis not present

## 2024-03-10 NOTE — Patient Instructions (Signed)

## 2024-03-11 ENCOUNTER — Encounter: Payer: Self-pay | Admitting: Nurse Practitioner

## 2024-03-11 ENCOUNTER — Ambulatory Visit (INDEPENDENT_AMBULATORY_CARE_PROVIDER_SITE_OTHER): Admitting: Nurse Practitioner

## 2024-03-11 ENCOUNTER — Telehealth: Payer: Self-pay

## 2024-03-11 VITALS — BP 138/82 | HR 80 | Ht 70.0 in | Wt 170.0 lb

## 2024-03-11 DIAGNOSIS — I10 Essential (primary) hypertension: Secondary | ICD-10-CM | POA: Diagnosis not present

## 2024-03-11 DIAGNOSIS — E1165 Type 2 diabetes mellitus with hyperglycemia: Secondary | ICD-10-CM

## 2024-03-11 DIAGNOSIS — Z794 Long term (current) use of insulin: Secondary | ICD-10-CM | POA: Diagnosis not present

## 2024-03-11 DIAGNOSIS — E782 Mixed hyperlipidemia: Secondary | ICD-10-CM

## 2024-03-11 MED ORDER — INSULIN ASPART 100 UNIT/ML IJ SOLN
10.0000 [IU] | Freq: Three times a day (TID) | INTRAMUSCULAR | 3 refills | Status: DC
Start: 2024-03-11 — End: 2024-09-23

## 2024-03-11 MED ORDER — DEXCOM G7 SENSOR MISC: 1.0000 | 3 refills | Status: DC

## 2024-03-11 MED ORDER — INSULIN GLARGINE 100 UNIT/ML ~~LOC~~ SOLN
50.0000 [IU] | Freq: Every day | SUBCUTANEOUS | 3 refills | Status: DC
Start: 2024-03-11 — End: 2024-06-12

## 2024-03-11 NOTE — Progress Notes (Signed)
 Endocrinology Consult Note       03/11/2024, 10:21 AM   Subjective:    Patient ID: Richard Mendoza, male    DOB: [birth year].  Richard Mendoza is being seen in consultation for management of currently uncontrolled symptomatic diabetes requested by  Zakhia, Karl, MD.  He previously saw Endocrinology at [facility] but he notes his previous provider left the practice without much notice.   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;  Surgeon: Angel Barba, DPM;  Location: [facility];  Service: Podiatry;  Laterality: Left;   AMPUTATION Left 11/16/2020   Procedure: AMPUTATION BELOW KNEE;  Surgeon: Celso College, MD;  Location: [facility];  Service: Vascular;  Laterality: Left;   AMPUTATION Left 01/29/2024   Procedure: AMPUTATION, ABOVE KNEE;  Surgeon: Celso College, MD;  Location: [facility];  Service: General;  Laterality: Left;   ANTERIOR CERVICAL DECOMP/DISCECTOMY FUSION N/A 09/09/2017   Procedure: ANTERIOR CERVICAL DECOMPRESSION/DISCECTOMY FUSION CERVICAL 6- CERVICAL 7;  Surgeon: Audie Bleacher, MD;  Location: [facility];  Service: Neurosurgery;  Laterality: N/A;  ANTERIOR CERVICAL DECOMPRESSION/DISCECTOMY FUSION CERVICAL 6- CERVICAL 7   APPENDECTOMY     I & D EXTREMITY Right 10/03/2017   Procedure: IRRIGATION AND DEBRIDEMENT RIGHT WRIST;  Surgeon: Brunilda Capra, MD;  Location: [facility] OR;  Service: Orthopedics;  Laterality: Right;   I & D EXTREMITY Right 11/26/2018   Procedure: IRRIGATION AND DEBRIDEMENT FASCIA ON RIGHT FOOT;  Surgeon: Anell Baptist, DPM;  Location: [facility];  Service: Podiatry;  Laterality: Right;   INCISION AND DRAINAGE Right 03/06/2019    Procedure: INCISION AND DRAINAGE RIGHT FOOT, WITH 4th RAY AMPUTATION;  Surgeon: Anell Baptist, DPM;  Location: [facility];  Service: Podiatry;  Laterality: Right;   INCISION AND DRAINAGE Left 11/07/2020   Procedure: INCISION AND DRAINAGE;  Surgeon: Angel Barba, DPM;  Location: [facility];  Service: Podiatry;  Laterality: Left;   INCISION AND DRAINAGE ABSCESS Left 05/02/2023   Procedure: INCISION AND DRAINAGE ABSCESS LEFT LOWER EXTREMITY;  Surgeon: Celso College, MD;  Location: [facility];  Service: Vascular;  Laterality: Left;   IRRIGATION AND DEBRIDEMENT FOOT Left 11/11/2020   Procedure: IRRIGATION AND DEBRIDEMENT FOOT;  Surgeon: Angel Barba, DPM;  Location: [facility];  Service: Podiatry;  Laterality: Left;   METATARSAL HEAD EXCISION Right 05/15/2019   Procedure: OSTECTOMY;MET HEAD 5;  Surgeon: Anell Baptist, DPM;  Location: [facility];  Service: Podiatry;  Laterality: Right;   osteomylitis     ROTATOR CUFF REPAIR Left    SHOULDER ARTHROSCOPY WITH DEBRIDEMENT AND BICEP TENDON REPAIR Right 08/27/2023   Procedure: SHOULDER ARTHROSCOPY WITH INCISION AND DRAINAGE;  Surgeon: Lorri Rota, MD;  Location: [facility];  Service: Orthopedics;  Laterality: Right;    Social History   Socioeconomic History   Marital status: Married    Spouse name: Not on file   Number of children: 4   Years of education: Not on file  Highest education level: Not on file  Occupational History   Not on file  Tobacco Use   Smoking status: Former    Current packs/day: 0.00    Average packs/day: 1 pack/day for 17.0 years (17.0 ttl pk-yrs)    Types: Cigarettes    Start date: 01/14/2002    Quit date: 2024    Years since quitting: 1.3   Smokeless tobacco: Never  Vaping Use   Vaping status: Never Used  Substance and Sexual Activity   Alcohol use: Not Currently    Comment: rare   Drug use: No   Sexual activity: Yes  Other Topics Concern   Not on file  Social History Narrative   Not on file   Social Drivers of Health    Financial Resource Strain: Low Risk  (07/24/2022)   Received from [facility]   Overall Financial Resource Strain (CARDIA)    Difficulty of Paying Living Expenses: Not hard at all  Food Insecurity: No Food Insecurity (01/25/2024)   Hunger Vital Sign    Worried About Running Out of Food in the Last Year: Never true    Ran Out of Food in the Last Year: Never true  Transportation Needs: No Transportation Needs (01/25/2024)   PRAPARE - Administrator, Civil Service (Medical): No    Lack of Transportation (Non-Medical): No  Physical Activity: Not on file  Stress: No Stress Concern Present (07/24/2022)   Received from [facility] - Occupational Stress Questionnaire    Feeling of Stress : Not at all  Social Connections: Unknown (03/21/2022)   Received from [facility]   Social Network    Social Network: Not on file    Family History  Problem Relation Age of Onset   Diabetes Mother    Heart disease Father    Diabetes Father    Arthritis Other    Hyperlipidemia Other    Hypertension Other    Cancer Other        breast   Mental illness Neg Hx     Outpatient Encounter Medications as of 03/11/2024  Medication Sig   acetaminophen  (TYLENOL ) 500 MG tablet Take 1 tablet (500 mg total) by mouth 3 (three) times daily.   amLODipine  (NORVASC ) 10 MG tablet Take 1 tablet (10 mg total) by mouth every evening.   ascorbic acid  (VITAMIN C ) 500 MG tablet Take 1 tablet (500 mg total) by mouth daily.   benazepril  (LOTENSIN ) 40 MG tablet Take 40 mg by mouth daily.   buPROPion  (WELLBUTRIN  XL) 150 MG 24 hr tablet Take 150 mg by mouth daily.   Continuous Glucose Sensor (DEXCOM G7 SENSOR) MISC Inject 1 Application into the skin as directed. Change sensor every 10 days as directed.   enoxaparin  (LOVENOX ) 40 MG/0.4ML injection Inject 0.4 mLs (40 mg total) into the skin every evening.   fenofibrate  54 MG tablet  Take 1 tablet (54 mg total) by mouth daily.   HYDROmorphone  (DILAUDID ) 2 MG tablet Take 0.5-1 tablets (1-2 mg total) by mouth every 4 (four) hours as needed for severe pain (pain score 7-10).   iron  polysaccharides (NIFEREX) 150 MG capsule Take 1 capsule (150 mg total) by mouth daily.   methocarbamol  (ROBAXIN ) 500 MG tablet Take 1 tablet (500 mg total) by mouth every 8 (eight) hours as needed for muscle spasms.   omeprazole (PRILOSEC) 20 MG capsule Take 20 mg by mouth daily.   pregabalin  (LYRICA )  50 MG capsule Take 1 capsule (50 mg total) by mouth 3 (three) times daily.   sodium bicarbonate  650 MG tablet Take 1 tablet (650 mg total) by mouth 2 (two) times daily.   traZODone  (DESYREL ) 100 MG tablet Take 100 mg by mouth at bedtime.   Vitamin D , Ergocalciferol , (DRISDOL ) 1.25 MG (50000 UNIT) CAPS capsule Take 1 capsule (50,000 Units total) by mouth every 7 (seven) days.   [DISCONTINUED] insulin  aspart (NOVOLOG ) 100 UNIT/ML injection Inject 0-9 Units into the skin 3 (three) times daily with meals. Do NOT hold if patient is NPO. Sensitive Scale. CBG < 70: Implement Hypoglycemia Standing Orders and refer to Hypoglycemia Standing Orders sidebar report CBG 70 - 120: 0 units CBG 121 - 150: 1 unit CBG 151 - 200: 2 units CBG 201 - 250: 3 units CBG 251 - 300: 5 units CBG 301 - 350: 7 units CBG 351 - 400: 9 units CBG > 400: call MD and obtain STAT lab verification   [DISCONTINUED] insulin  aspart (NOVOLOG ) 100 UNIT/ML injection Inject 10 Units into the skin 3 (three) times daily with meals.   [DISCONTINUED] insulin  glargine (LANTUS ) 100 UNIT/ML injection Inject 0.5 mLs (50 Units total) into the skin daily.   insulin  aspart (NOVOLOG ) 100 UNIT/ML injection Inject 10-16 Units into the skin 3 (three) times daily with meals. 90-150= 10 units; 151-200= 11 units; 201-250= 12 units; 251-300= 13 units; 301-350= 14 units; 351-400= 15 units; 401 or higher = 16 units   insulin  glargine (LANTUS ) 100 UNIT/ML injection Inject 0.5  mLs (50 Units total) into the skin at bedtime.   [DISCONTINUED] Continuous Glucose Sensor (DEXCOM G6 SENSOR) MISC SMARTSIG:Topical Every 10 Days (Patient not taking: Reported on 03/03/2024)   [DISCONTINUED] Continuous Glucose Transmitter (DEXCOM G6 TRANSMITTER) MISC  (Patient not taking: Reported on 03/03/2024)   [DISCONTINUED] [employer] SENNA 8.6 MG tablet Take 1 tablet by mouth 2 (two) times daily as needed for constipation. (Patient not taking: Reported on 01/23/2024)   [DISCONTINUED] FIASP  100 UNIT/ML SOLN Inject into the skin continuous. Via pump (Patient not taking: Reported on 03/03/2024)   [DISCONTINUED] insulin  aspart (NOVOLOG ) 100 UNIT/ML injection Inject 0-5 Units into the skin at bedtime. (Patient not taking: Reported on 03/11/2024)   [DISCONTINUED] insulin  aspart (NOVOLOG ) 100 UNIT/ML injection FOR USE WITH HOME INSULIN  PUMP ON DISCHARGE FROM SNF (Patient not taking: Reported on 03/11/2024)   [DISCONTINUED] Insulin  Disposable Pump (OMNIPOD 5 DEXG7G6 PODS GEN 5) MISC SMARTSIG:1 Device SUB-Q Every Other Day (Patient not taking: Reported on 03/03/2024)   [DISCONTINUED] ondansetron  (ZOFRAN ) 4 MG tablet Take 1 tablet (4 mg total) by mouth every 8 (eight) hours as needed for nausea or vomiting. (Patient not taking: Reported on 03/11/2024)   [DISCONTINUED] pantoprazole  (PROTONIX ) 40 MG tablet Take 40 mg by mouth daily. (Patient not taking: Reported on 03/11/2024)   [DISCONTINUED] senna-docusate (SENOKOT-S) 8.6-50 MG tablet Take 1 tablet by mouth at bedtime as needed for mild constipation. (Patient not taking: Reported on 03/11/2024)   No facility-administered encounter medications on file as of 03/11/2024.    ALLERGIES: Allergies  Allergen Reactions   Oxycodone  Itching    VACCINATION STATUS: Immunization History  Administered Date(s) Administered   Tdap 07/29/2016, 07/23/2018, 11/25/2018    Diabetes He presents for his initial diabetic visit. He has type 2 diabetes mellitus. Onset time: diagnosed at  approx age of 32. His disease course has been fluctuating. Hypoglycemia symptoms include nervousness/anxiousness, sweats and tremors. Associated symptoms include blurred vision, fatigue, foot paresthesias, polydipsia and polyuria. There are  no hypoglycemic complications. Diabetic complications include nephropathy, peripheral neuropathy and PVD (hx Left AKA). Risk factors for coronary artery disease include diabetes mellitus, dyslipidemia, male sex, hypertension, tobacco exposure, sedentary lifestyle and family history. Current diabetic treatment includes intensive insulin  program. He is compliant with treatment most of the time (currently in [facility] facility in [place]). His weight is fluctuating minimally. He is following a generally unhealthy diet. When asked about meal planning, he reported none. He has not had a previous visit with a dietitian. He participates in exercise intermittently. (He presents today for his consultation, accompanied by his mother, with no meter or readings to review.  He is currently at [facility] facility in [place] after recent L AKA.  He notes he will likely be there for another couple of months until he gets his new prosthetic leg.  Prior to his recent hospitalization for AKA, he was using insulin  pump (old Medtronic and Dexcom G6).  He notes he is interested in trying the Omnipod 5 and had the starter kit at home but never was able to get scheduled with his previous endocrinologist to get it programmed.  He drinks zero sugar drinks, water  (with SF flavoring), coffee with creamer, and eats 3 meals per day (at home he would only eat once per day).  He endorses staying active, was using prosthetic limb for BKA prior to AKA.  He is UTD on eye exam, has seen podiatry in the past as well (hx of right great toe amputation).) An ACE inhibitor/angiotensin II receptor blocker is being taken. He sees a podiatrist.Eye exam is current.     Review of  systems  Constitutional: + Minimally fluctuating body weight, current Body mass index is 24.39 kg/m., no fatigue, no subjective hyperthermia, no subjective hypothermia Eyes: no blurry vision, no xerophthalmia ENT: no sore throat, no nodules palpated in throat, no dysphagia/odynophagia, no hoarseness Cardiovascular: no chest pain, no shortness of breath, no palpitations, no leg swelling Respiratory: no cough, no shortness of breath Gastrointestinal: no nausea/vomiting/diarrhea Musculoskeletal: no muscle/joint aches, hx L AKA Skin: no rashes, no hyperemia Neurological: no tremors, no numbness, no tingling, no dizziness Psychiatric: no depression, no anxiety  Objective:     BP 138/82 (BP Location: Left Arm, Patient Position: Sitting, Cuff Size: Large)   Pulse 80   Ht 5\' 10"  (1.778 m)   Wt 170 lb (77.1 kg)   BMI 24.39 kg/m   Wt Readings from Last 3 Encounters:  03/11/24 170 lb (77.1 kg)  03/03/24 170 lb (77.1 kg)  01/29/24 170 lb (77.1 kg)     BP Readings from Last 3 Encounters:  03/11/24 138/82  03/03/24 (!) 142/84  02/11/24 (!) 159/97     Physical Exam- Limited  Constitutional:  Body mass index is 24.39 kg/m. , not in acute distress, normal state of mind Eyes:  EOMI, no exophthalmos Neck: Supple Cardiovascular: RRR, no murmurs, rubs, or gallops, no edema Respiratory: Adequate breathing efforts, no crackles, rales, rhonchi, or wheezing Musculoskeletal: L-AKA, strength intact in all four extremities, no gross restriction of joint movements Skin:  no rashes, no hyperemia Neurological: no tremor with outstretched hands   Diabetic Foot Exam - Simple   No data filed      CMP ( most recent) CMP     Component Value Date/Time   NA 138 02/05/2024 0542   K 3.9 02/05/2024 0542   CL 108 02/05/2024 0542   CO2 25 02/05/2024 0542   GLUCOSE 187 (H) 02/05/2024 0542   BUN 31 (  H) 02/05/2024 0542   CREATININE 2.09 (H) 02/05/2024 0542   CALCIUM  8.3 (L) 02/05/2024 0542   PROT  6.1 (L) 01/26/2024 0509   ALBUMIN 2.1 (L) 01/26/2024 0509   AST 12 (L) 01/26/2024 0509   ALT 11 01/26/2024 0509   ALKPHOS 73 01/26/2024 0509   BILITOT 0.6 01/26/2024 0509   GFR 118.24 10/24/2011 1654   GFRNONAA 39 (L) 02/05/2024 0542     Diabetic Labs (most recent): Lab Results  Component Value Date   HGBA1C 13.6 (H) 01/24/2024   HGBA1C 13.2 (H) 08/20/2023   HGBA1C 12.2 (H) 04/30/2023   MICROALBUR 0.4 11/23/2013   MICROALBUR 0.3 10/24/2011   MICROALBUR 2.2 (H) 06/02/2010     Lipid Panel ( most recent) Lipid Panel     Component Value Date/Time   CHOL 273 (H) 10/24/2011 1654   TRIG 827.0 (H) 10/24/2011 1654   HDL 35.90 (L) 10/24/2011 1654   CHOLHDL 8 10/24/2011 1654   VLDL 165.4 (H) 10/24/2011 1654   LDLDIRECT 130.2 10/24/2011 1654      No results found for: "TSH", "FREET4"         Assessment & Plan:   1) Type 2 diabetes mellitus with hyperglycemia, with long-term current use of insulin  (HCC) (Primary)  He presents today for his consultation, accompanied by his mother, with no meter or readings to review.  He is currently at [facility] facility in [place] after recent L AKA.  He notes he will likely be there for another couple of months until he gets his new prosthetic leg.  Prior to his recent hospitalization for AKA, he was using insulin  pump (old Medtronic and Dexcom G6).  He notes he is interested in trying the Omnipod 5 and had the starter kit at home but never was able to get scheduled with his previous endocrinologist to get it programmed.  He drinks zero sugar drinks, water  (with SF flavoring), coffee with creamer, and eats 3 meals per day (at home he would only eat once per day).  He endorses staying active, was using prosthetic limb for BKA prior to AKA.  He is UTD on eye exam, has seen podiatry in the past as well (hx of right great toe amputation).  Richard Mendoza has currently uncontrolled symptomatic type 2 DM since 48 years of age, with most  recent A1c of 13.6 %.   -Recent labs reviewed.  - I had a long discussion with him about the progressive nature of diabetes and the pathology behind its complications. -his diabetes is complicated by PVD (Hx L-AKA), CKD stage 3b, neuropathy and he remains at a high risk for more acute and chronic complications which include CAD, CVA, CKD, retinopathy, and neuropathy. These are all discussed in detail with him.  The following Lifestyle Medicine recommendations according to American College of Lifestyle Medicine [facility]) were discussed and offered to patient and he agrees to start the journey:  A. Whole Foods, Plant-based plate comprising of fruits and vegetables, plant-based proteins, whole-grain carbohydrates was discussed in detail with the patient.   A list for source of those nutrients were also provided to the patient.  Patient will use only water  or unsweetened tea for hydration. B.  The need to stay away from risky substances including alcohol, smoking; obtaining 7 to 9 hours of restorative sleep, at least 150 minutes of moderate intensity exercise weekly, the importance of healthy social connections,  and stress reduction techniques were discussed. C.  A full color page of  Calorie density  of various food groups per pound showing examples of each food groups was provided to the patient.  - I have counseled him on diet and weight management by adopting a carbohydrate restricted/protein rich diet. Patient is encouraged to switch to unprocessed or minimally processed complex starch and increased protein intake (animal or plant source), fruits, and vegetables. -  he is advised to stick to a routine mealtimes to eat 3 meals a day and avoid unnecessary snacks (to snack only to correct hypoglycemia).   - he acknowledges that there is a room for improvement in his food and drink choices. - Suggestion is made for him to avoid simple carbohydrates from his diet including Cakes, Sweet Desserts, Ice Cream,  Soda (diet and regular), Sweet Tea, Candies, Chips, Cookies, Store Bought Juices, Alcohol in Excess of 1-2 drinks a day, Artificial Sweeteners, Coffee Creamer, and "Sugar-free" Products. This will help patient to have more stable blood glucose profile and potentially avoid unintended weight gain.  - I have approached him with the following individualized plan to manage his diabetes and patient agrees:   -He is advised to change the timing of his Lantus  injections to night time, keeping it at 50 units SQ nightly.  He can continue his Novolog  10-16 units TID with meals if glucose is above 90 and he is eating (Specific instructions on how to titrate insulin  dosage based on glucose readings given to patient in writing).    -he is encouraged to start/continue monitoring glucose 4 times daily, before meals and before bed, to log their readings on the clinic sheets provided, and bring them to review at follow up appointment in 3 months.  I did send in Dexcom G7 for him as he is interested in moving forward with the Omnipod 5 pump when he gets released from rehab.  - he is warned not to take insulin  without proper monitoring per orders. - Adjustment parameters are given to him for hypo and hyperglycemia in writing. - he is encouraged to call clinic for blood glucose levels less than 70 or above 300 mg /dl.  - he is not a candidate for Metformin due to concurrent renal insufficiency.  He notes his nephrologist was discussing trying a different medication that may help with kidney function (perhaps [place]).  He was previously on Jardiance but was taken off around the same time as he was having osteomyelitis.  I do not feel that we need to initiate SGLT2i from a diabetes perspective as it has weak A1c reduction properties.  Due to side effects and cost, will keep him off of this for now.  - he is not an ideal candidate for incretin therapy given body habitus (BMI less than 25) and heavy smoking history (quit in  October 2024) which increases his risk of pancreatitis.  - Specific targets for  A1c; LDL, HDL, and Triglycerides were discussed with the patient.  2) Blood Pressure /Hypertension:  his blood pressure is controlled to target.   he is advised to continue his current medications as prescribed by his PCP.  3) Lipids/Hyperlipidemia:    There is no recent lipid panel available to review, nor does he appear to be on any statin medication.    4)  Weight/Diet:  his Body mass index is 24.39 kg/m.  -  he is NOT a candidate for weight loss.   Exercise, and detailed carbohydrates information provided  -  detailed on discharge instructions.  5) Chronic Care/Health Maintenance: -he is on ACEI/ARB and is not  on Statin medications and is encouraged to initiate and continue to follow up with Ophthalmology, Dentist, Podiatrist at least yearly or according to recommendations, and advised to stay away from smoking. I have recommended yearly flu vaccine and pneumonia vaccine at least every 5 years; moderate intensity exercise for up to 150 minutes weekly; and sleep for at least 7 hours a day.  - he is advised to maintain close follow up with Zakhia, Karl, MD for primary care needs, as well as his other providers for optimal and coordinated care.   - Time spent in this patient care: 60 min, which was spent in counseling him about his diabetes and the rest reviewing his blood glucose logs, discussing his hypoglycemia and hyperglycemia episodes, reviewing his current and previous labs/studies (including abstraction from other facilities) and medications doses and developing a long term treatment plan based on the latest standards of care/guidelines; and documenting his care.    Please refer to Patient Instructions for Blood Glucose Monitoring and Insulin /Medications Dosing Guide" in media tab for additional information. Please also refer to "Patient Self Inventory" in the Media tab for reviewed elements of pertinent  patient history.  Richard Mendoza participated in the discussions, expressed understanding, and voiced agreement with the above plans.  All questions were answered to his satisfaction. he is encouraged to contact clinic should he have any questions or concerns prior to his return visit.     Follow up plan: - Return in about 3 months (around 06/10/2024) for Diabetes F/U with A1c in office, No previsit labs, Bring meter and logs.    Hulon Magic, [facility] [address] [place], [place] [address] Phone: [phone] Fax: [phone]  03/11/2024, 10:21 AM

## 2024-03-11 NOTE — Telephone Encounter (Signed)
 Pharmacy Patient Advocate Encounter   Received notification from CoverMyMeds that prior authorization for Dexcom g7 sensor is required/requested.   Insurance verification completed.   The patient is insured through [facility] .   Per test claim: PA required; PA submitted to above mentioned insurance via CoverMyMeds Key/confirmation #/EOC [id number] Status is pending

## 2024-03-12 ENCOUNTER — Ambulatory Visit (INDEPENDENT_AMBULATORY_CARE_PROVIDER_SITE_OTHER): Admitting: Vascular Surgery

## 2024-03-12 ENCOUNTER — Encounter (INDEPENDENT_AMBULATORY_CARE_PROVIDER_SITE_OTHER): Payer: Self-pay | Admitting: Vascular Surgery

## 2024-03-12 VITALS — BP 137/75 | HR 73 | Resp 18 | Ht 70.0 in | Wt 170.0 lb

## 2024-03-12 DIAGNOSIS — Z89612 Acquired absence of left leg above knee: Secondary | ICD-10-CM

## 2024-03-12 NOTE — Telephone Encounter (Signed)
 He can come in this afternoon and see Richard Mendoza

## 2024-03-12 NOTE — Progress Notes (Signed)
 Subjective:    Patient ID: Richard Mendoza, male    DOB: [birth year], 48 y.o.   MRN: [id number] Chief Complaint  Patient presents with   Follow-up    fu urgent appt    Richard Mendoza is a 48 year old male now status post left AKA.  He returns to clinic today for the second half of his staple removal.  No signs or symptoms of infection hematoma or seroma to note.  The incision line is reddened due to the fact he continues to have some staples in place.  Those will be removed today.  He endorses he has a new endocrinologist which is helping him with his control of his blood sugars.  Seems in good spirits today.  Looking forward to getting a prosthetic in the future so he can start walking again.    Review of Systems  Constitutional: Negative.   Respiratory: Negative.    Cardiovascular: Negative.   Gastrointestinal: Negative.   Endocrine: Negative.        Hx of uncontrolled Diabetes  Genitourinary: Negative.   Neurological: Negative.   Psychiatric/Behavioral: Negative.    All other systems reviewed and are negative.      Objective:   Physical Exam Vitals reviewed.  Constitutional:      Appearance: Normal appearance. He is normal weight.  HENT:     Head: Normocephalic.  Eyes:     Pupils: Pupils are equal, round, and reactive to light.  Cardiovascular:     Rate and Rhythm: Normal rate and regular rhythm.  Pulmonary:     Effort: Pulmonary effort is normal.     Breath sounds: Normal breath sounds.  Abdominal:     General: Abdomen is flat.     Palpations: Abdomen is soft.  Musculoskeletal:        General: Normal range of motion.     Comments: History of Right AKA  Skin:    General: Skin is warm and dry.  Neurological:     General: No focal deficit present.     Mental Status: He is alert and oriented to person, place, and time.  Psychiatric:        Mood and Affect: Mood normal.        Behavior: Behavior normal.        Thought Content: Thought content normal.         Judgment: Judgment normal.     BP 137/75   Pulse 73   Resp 18   Ht 5\' 10"  (1.778 m)   Wt 170 lb (77.1 kg)   BMI 24.39 kg/m   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    Social History   Socioeconomic History   Marital status: Married    Spouse name: Not on file   Number of children: 4   Years of education: Not on file   Highest education level: Not on file  Occupational History   Not on file  Tobacco Use   Smoking status: Former    Current packs/day: 0.00    Average packs/day: 1 pack/day for 17.0 years (17.0 ttl pk-yrs)    Types: Cigarettes    Start date: 01/14/2002    Quit date: 2024    Years since quitting: 1.3   Smokeless tobacco: Never  Vaping Use   Vaping status: Never Used  Substance and Sexual Activity   Alcohol use:  Not Currently    Comment: rare   Drug use: No   Sexual activity: Yes  Other Topics Concern   Not on file  Social History Narrative   Not on file   Social Drivers of Health   Financial Resource Strain: Low Risk  (07/24/2022)   Received from [facility], [facility]   Overall Financial Resource Strain (CARDIA)    Difficulty of Paying Living Expenses: Not hard at all  Food Insecurity: No Food Insecurity (01/25/2024)   Hunger Vital Sign    Worried About Running Out of Food in the Last Year: Never true    Ran Out of Food in the Last Year: Never true  Transportation Needs: No Transportation Needs (01/25/2024)   PRAPARE - Administrator, Civil Service (Medical): No    Lack of Transportation (Non-Medical): No  Physical Activity: Not on file  Stress: No Stress Concern Present (07/24/2022)   Received from [facility], [facility] Occupational Health - Occupational Stress Questionnaire    Feeling of Stress : Not at all  Social Connections: Unknown (03/21/2022)    Received from [facility], [facility]   Social Network    Social Network: Not on file  Intimate Partner Violence: Not At Risk (01/25/2024)   Humiliation, Afraid, Rape, and Kick questionnaire    Fear of Current or Ex-Partner: No    Emotionally Abused: No    Physically Abused: No    Sexually Abused: No    Past Surgical History:  Procedure Laterality Date   AMPUTATION Left 11/07/2020   Procedure: 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;  Surgeon: Angel Barba, DPM;  Location: [facility];  Service: Podiatry;  Laterality: Left;   AMPUTATION Left 11/16/2020   Procedure: AMPUTATION BELOW KNEE;  Surgeon: Celso College, MD;  Location: [facility];  Service: Vascular;  Laterality: Left;   AMPUTATION Left 01/29/2024   Procedure: AMPUTATION, ABOVE KNEE;  Surgeon: Celso College, MD;  Location: [facility];  Service: General;  Laterality: Left;   ANTERIOR CERVICAL DECOMP/DISCECTOMY FUSION N/A 09/09/2017   Procedure: ANTERIOR CERVICAL DECOMPRESSION/DISCECTOMY FUSION CERVICAL 6- CERVICAL 7;  Surgeon: Audie Bleacher, MD;  Location: [facility];  Service: Neurosurgery;  Laterality: N/A;  ANTERIOR CERVICAL DECOMPRESSION/DISCECTOMY FUSION CERVICAL 6- CERVICAL 7   APPENDECTOMY     I & D EXTREMITY Right 10/03/2017   Procedure: IRRIGATION AND DEBRIDEMENT RIGHT WRIST;  Surgeon: Brunilda Capra, MD;  Location: [facility];  Service: Orthopedics;  Laterality: Right;   I & D EXTREMITY Right 11/26/2018   Procedure: IRRIGATION AND DEBRIDEMENT FASCIA ON RIGHT FOOT;  Surgeon: Anell Baptist, DPM;  Location: [facility];  Service: Podiatry;  Laterality: Right;   INCISION AND DRAINAGE Right 03/06/2019   Procedure: INCISION AND DRAINAGE RIGHT FOOT, WITH 4th RAY AMPUTATION;  Surgeon: Anell Baptist, DPM;  Location: [facility];  Service: Podiatry;  Laterality: Right;   INCISION AND DRAINAGE Left 11/07/2020   Procedure: INCISION AND DRAINAGE;  Surgeon: Angel Barba, DPM;  Location: [facility];  Service: Podiatry;  Laterality: Left;   INCISION  AND DRAINAGE ABSCESS Left 05/02/2023   Procedure: INCISION AND DRAINAGE ABSCESS LEFT LOWER EXTREMITY;  Surgeon: Celso College, MD;  Location: [facility];  Service: Vascular;  Laterality: Left;   IRRIGATION AND DEBRIDEMENT FOOT Left 11/11/2020   Procedure: IRRIGATION AND DEBRIDEMENT FOOT;  Surgeon: Angel Barba, DPM;  Location: [facility];  Service: Podiatry;  Laterality: Left;   METATARSAL HEAD EXCISION Right 05/15/2019   Procedure:  OSTECTOMY;MET HEAD 5;  Surgeon: Anell Baptist, DPM;  Location: [facility];  Service: Podiatry;  Laterality: Right;   osteomylitis     ROTATOR CUFF REPAIR Left    SHOULDER ARTHROSCOPY WITH DEBRIDEMENT AND BICEP TENDON REPAIR Right 08/27/2023   Procedure: SHOULDER ARTHROSCOPY WITH INCISION AND DRAINAGE;  Surgeon: Lorri Rota, MD;  Location: [facility];  Service: Orthopedics;  Laterality: Right;    Family History  Problem Relation Age of Onset   Diabetes Mother    Heart disease Father    Diabetes Father    Arthritis Other    Hyperlipidemia Other    Hypertension Other    Cancer Other        breast   Mental illness Neg Hx     Allergies  Allergen Reactions   Oxycodone  Itching       Latest Ref Rng & Units 02/05/2024    5:42 AM 02/04/2024    3:35 AM 02/03/2024    6:40 AM  CBC  WBC 4.0 - 10.5 K/uL 9.2  9.7  10.2   Hemoglobin 13.0 - 17.0 g/dL 8.1  8.2  8.5   Hematocrit 39.0 - 52.0 % 24.6  24.7  25.4   Platelets 150 - 400 K/uL 297  297  286       CMP     Component Value Date/Time   NA 138 02/05/2024 0542   K 3.9 02/05/2024 0542   CL 108 02/05/2024 0542   CO2 25 02/05/2024 0542   GLUCOSE 187 (H) 02/05/2024 0542   BUN 31 (H) 02/05/2024 0542   CREATININE 2.09 (H) 02/05/2024 0542   CALCIUM  8.3 (L) 02/05/2024 0542   PROT 6.1 (L) 01/26/2024 0509   ALBUMIN 2.1 (L) 01/26/2024 0509   AST 12 (L) 01/26/2024 0509   ALT 11 01/26/2024 0509   ALKPHOS 73 01/26/2024 0509   BILITOT 0.6 01/26/2024 0509   GFR 118.24 10/24/2011 1654   GFRNONAA 39 (L) 02/05/2024 0542      No results found.     Assessment & Plan:    1. S/P AKA (above knee amputation), left (HCC) (Primary) The second half of the patient's staples were removed today.  He had redness along the staple line but no dehiscence.  No drainage noted.  No pain on palpation.  I believe the redness is most likely related to the staples that were in place.  However patient was placed on Keflex  3 times a day by the physician at rehab and I agree with continuing with this plan as the patient has had difficulties prior with controlling infection due to his uncontrolled diabetes.  Patient is healing and recovering as expected.  Patient will follow-up in 1 month for wound check..   Current Outpatient Medications on File Prior to Visit  Medication Sig Dispense Refill   acetaminophen  (TYLENOL ) 500 MG tablet Take 1 tablet (500 mg total) by mouth 3 (three) times daily.     amLODipine  (NORVASC ) 10 MG tablet Take 1 tablet (10 mg total) by mouth every evening.     ascorbic acid  (VITAMIN C ) 500 MG tablet Take 1 tablet (500 mg total) by mouth daily.     benazepril  (LOTENSIN ) 40 MG tablet Take 40 mg by mouth daily.     buPROPion  (WELLBUTRIN  XL) 150 MG 24 hr tablet Take 150 mg by mouth daily.     cephALEXin  (KEFLEX ) 500 MG capsule Take 500 mg by mouth 3 (three) times daily.     Continuous Glucose Sensor (DEXCOM G7 SENSOR) MISC Inject  1 Application into the skin as directed. Change sensor every 10 days as directed. 9 each 3   ELIQUIS 5 MG TABS tablet Take 5 mg by mouth 2 (two) times daily.     enoxaparin  (LOVENOX ) 40 MG/0.4ML injection Inject 0.4 mLs (40 mg total) into the skin every evening.     fenofibrate  54 MG tablet Take 1 tablet (54 mg total) by mouth daily.     HYDROmorphone  (DILAUDID ) 2 MG tablet Take 0.5-1 tablets (1-2 mg total) by mouth every 4 (four) hours as needed for severe pain (pain score 7-10). 30 tablet 0   insulin  aspart (NOVOLOG ) 100 UNIT/ML injection Inject 10-16 Units into the skin 3 (three)  times daily with meals. 90-150= 10 units; 151-200= 11 units; 201-250= 12 units; 251-300= 13 units; 301-350= 14 units; 351-400= 15 units; 401 or higher = 16 units 45 mL 3   insulin  glargine (LANTUS ) 100 UNIT/ML injection Inject 0.5 mLs (50 Units total) into the skin at bedtime. 45 mL 3   iron  polysaccharides (NIFEREX) 150 MG capsule Take 1 capsule (150 mg total) by mouth daily. 30 capsule 0   methocarbamol  (ROBAXIN ) 500 MG tablet Take 1 tablet (500 mg total) by mouth every 8 (eight) hours as needed for muscle spasms.     omeprazole (PRILOSEC) 20 MG capsule Take 20 mg by mouth daily.     pregabalin  (LYRICA ) 50 MG capsule Take 1 capsule (50 mg total) by mouth 3 (three) times daily. 45 capsule 0   sodium bicarbonate  650 MG tablet Take 1 tablet (650 mg total) by mouth 2 (two) times daily.     traZODone  (DESYREL ) 100 MG tablet Take 100 mg by mouth at bedtime.     Vitamin D , Ergocalciferol , (DRISDOL ) 1.25 MG (50000 UNIT) CAPS capsule Take 1 capsule (50,000 Units total) by mouth every 7 (seven) days.     No current facility-administered medications on file prior to visit.    There are no Patient Instructions on file for this visit. No follow-ups on file.   Annamaria Barrette, NP

## 2024-03-17 ENCOUNTER — Ambulatory Visit (INDEPENDENT_AMBULATORY_CARE_PROVIDER_SITE_OTHER): Admitting: Nurse Practitioner

## 2024-03-17 DIAGNOSIS — R600 Localized edema: Secondary | ICD-10-CM | POA: Diagnosis not present

## 2024-03-17 DIAGNOSIS — I10 Essential (primary) hypertension: Secondary | ICD-10-CM | POA: Diagnosis not present

## 2024-03-17 DIAGNOSIS — M869 Osteomyelitis, unspecified: Secondary | ICD-10-CM | POA: Diagnosis not present

## 2024-03-17 DIAGNOSIS — M6281 Muscle weakness (generalized): Secondary | ICD-10-CM | POA: Diagnosis not present

## 2024-03-17 DIAGNOSIS — E1165 Type 2 diabetes mellitus with hyperglycemia: Secondary | ICD-10-CM | POA: Diagnosis not present

## 2024-03-17 DIAGNOSIS — F329 Major depressive disorder, single episode, unspecified: Secondary | ICD-10-CM | POA: Diagnosis not present

## 2024-03-17 DIAGNOSIS — D649 Anemia, unspecified: Secondary | ICD-10-CM | POA: Diagnosis not present

## 2024-03-17 DIAGNOSIS — K219 Gastro-esophageal reflux disease without esophagitis: Secondary | ICD-10-CM | POA: Diagnosis not present

## 2024-03-17 DIAGNOSIS — S78112A Complete traumatic amputation at level between left hip and knee, initial encounter: Secondary | ICD-10-CM | POA: Diagnosis not present

## 2024-03-17 DIAGNOSIS — L03115 Cellulitis of right lower limb: Secondary | ICD-10-CM | POA: Diagnosis not present

## 2024-03-20 DIAGNOSIS — I10 Essential (primary) hypertension: Secondary | ICD-10-CM | POA: Diagnosis not present

## 2024-03-20 DIAGNOSIS — K219 Gastro-esophageal reflux disease without esophagitis: Secondary | ICD-10-CM | POA: Diagnosis not present

## 2024-03-20 DIAGNOSIS — L03115 Cellulitis of right lower limb: Secondary | ICD-10-CM | POA: Diagnosis not present

## 2024-03-20 DIAGNOSIS — M6281 Muscle weakness (generalized): Secondary | ICD-10-CM | POA: Diagnosis not present

## 2024-03-20 DIAGNOSIS — D649 Anemia, unspecified: Secondary | ICD-10-CM | POA: Diagnosis not present

## 2024-03-20 DIAGNOSIS — E1165 Type 2 diabetes mellitus with hyperglycemia: Secondary | ICD-10-CM | POA: Diagnosis not present

## 2024-03-20 DIAGNOSIS — M869 Osteomyelitis, unspecified: Secondary | ICD-10-CM | POA: Diagnosis not present

## 2024-03-20 DIAGNOSIS — R600 Localized edema: Secondary | ICD-10-CM | POA: Diagnosis not present

## 2024-03-20 DIAGNOSIS — S78112A Complete traumatic amputation at level between left hip and knee, initial encounter: Secondary | ICD-10-CM | POA: Diagnosis not present

## 2024-03-20 DIAGNOSIS — F329 Major depressive disorder, single episode, unspecified: Secondary | ICD-10-CM | POA: Diagnosis not present

## 2024-03-22 ENCOUNTER — Encounter: Payer: Self-pay | Admitting: Nurse Practitioner

## 2024-03-23 MED ORDER — "INSULIN SYRINGE 29G X 1/2"" 0.5 ML MISC": 11 refills | Status: DC

## 2024-03-23 NOTE — Telephone Encounter (Signed)
 Is it for the  stump protector prescription left AKA? I gave that RX to April on 02/10/2024, can you see if she still has it

## 2024-03-23 NOTE — Telephone Encounter (Signed)
 On your desk

## 2024-03-23 NOTE — Telephone Encounter (Signed)
 Called patient he stated he needs a Above the prosthetic Rx.

## 2024-03-23 NOTE — Telephone Encounter (Signed)
 Patient states he needs a prescription sent to the [facility] clinic. Pt has an appointment with w/Mike at the [facility] on 04/09/24 and needs order sent before so they can order the parts.

## 2024-03-23 NOTE — Telephone Encounter (Addendum)
 She doesn't, can you write another one

## 2024-03-24 DIAGNOSIS — M6281 Muscle weakness (generalized): Secondary | ICD-10-CM | POA: Diagnosis not present

## 2024-03-24 DIAGNOSIS — F329 Major depressive disorder, single episode, unspecified: Secondary | ICD-10-CM | POA: Diagnosis not present

## 2024-03-24 DIAGNOSIS — E1165 Type 2 diabetes mellitus with hyperglycemia: Secondary | ICD-10-CM | POA: Diagnosis not present

## 2024-03-24 DIAGNOSIS — I10 Essential (primary) hypertension: Secondary | ICD-10-CM | POA: Diagnosis not present

## 2024-03-24 DIAGNOSIS — S78112A Complete traumatic amputation at level between left hip and knee, initial encounter: Secondary | ICD-10-CM | POA: Diagnosis not present

## 2024-03-24 DIAGNOSIS — D649 Anemia, unspecified: Secondary | ICD-10-CM | POA: Diagnosis not present

## 2024-03-24 DIAGNOSIS — L03115 Cellulitis of right lower limb: Secondary | ICD-10-CM | POA: Diagnosis not present

## 2024-03-24 DIAGNOSIS — M869 Osteomyelitis, unspecified: Secondary | ICD-10-CM | POA: Diagnosis not present

## 2024-03-24 DIAGNOSIS — K219 Gastro-esophageal reflux disease without esophagitis: Secondary | ICD-10-CM | POA: Diagnosis not present

## 2024-03-24 DIAGNOSIS — R600 Localized edema: Secondary | ICD-10-CM | POA: Diagnosis not present

## 2024-03-24 NOTE — Telephone Encounter (Signed)
 Pharmacy Patient Advocate Encounter  Received notification from [employer] that Prior Authorization for Dexcom G7 sensor has been APPROVED from 03/11/24 to 09/10/2024   PA #/Case ID/Reference #: [id number]

## 2024-03-24 NOTE — Telephone Encounter (Signed)
 Sent!

## 2024-03-26 ENCOUNTER — Other Ambulatory Visit (HOSPITAL_COMMUNITY): Payer: Self-pay

## 2024-03-26 NOTE — Telephone Encounter (Signed)
 Patient was called and made aware that the sensors had been covered. He is stating that insurance will not cover his insulin  needled. This will be sent to the PA Team to see if they have seen that a PA is needed. We have not received any notification at this time.

## 2024-03-27 DIAGNOSIS — M6281 Muscle weakness (generalized): Secondary | ICD-10-CM | POA: Diagnosis not present

## 2024-03-27 DIAGNOSIS — L03115 Cellulitis of right lower limb: Secondary | ICD-10-CM | POA: Diagnosis not present

## 2024-03-27 DIAGNOSIS — D649 Anemia, unspecified: Secondary | ICD-10-CM | POA: Diagnosis not present

## 2024-03-27 DIAGNOSIS — M19011 Primary osteoarthritis, right shoulder: Secondary | ICD-10-CM | POA: Diagnosis not present

## 2024-03-27 DIAGNOSIS — S78112A Complete traumatic amputation at level between left hip and knee, initial encounter: Secondary | ICD-10-CM | POA: Diagnosis not present

## 2024-03-27 DIAGNOSIS — I10 Essential (primary) hypertension: Secondary | ICD-10-CM | POA: Diagnosis not present

## 2024-03-27 DIAGNOSIS — M25511 Pain in right shoulder: Secondary | ICD-10-CM | POA: Diagnosis not present

## 2024-03-27 DIAGNOSIS — R600 Localized edema: Secondary | ICD-10-CM | POA: Diagnosis not present

## 2024-03-27 DIAGNOSIS — M869 Osteomyelitis, unspecified: Secondary | ICD-10-CM | POA: Diagnosis not present

## 2024-03-27 DIAGNOSIS — F329 Major depressive disorder, single episode, unspecified: Secondary | ICD-10-CM | POA: Diagnosis not present

## 2024-03-27 DIAGNOSIS — K219 Gastro-esophageal reflux disease without esophagitis: Secondary | ICD-10-CM | POA: Diagnosis not present

## 2024-03-31 DIAGNOSIS — K219 Gastro-esophageal reflux disease without esophagitis: Secondary | ICD-10-CM | POA: Diagnosis not present

## 2024-03-31 DIAGNOSIS — F329 Major depressive disorder, single episode, unspecified: Secondary | ICD-10-CM | POA: Diagnosis not present

## 2024-03-31 DIAGNOSIS — E1140 Type 2 diabetes mellitus with diabetic neuropathy, unspecified: Secondary | ICD-10-CM | POA: Diagnosis not present

## 2024-03-31 DIAGNOSIS — R600 Localized edema: Secondary | ICD-10-CM | POA: Diagnosis not present

## 2024-03-31 DIAGNOSIS — M6281 Muscle weakness (generalized): Secondary | ICD-10-CM | POA: Diagnosis not present

## 2024-03-31 DIAGNOSIS — M19011 Primary osteoarthritis, right shoulder: Secondary | ICD-10-CM | POA: Diagnosis not present

## 2024-03-31 DIAGNOSIS — M869 Osteomyelitis, unspecified: Secondary | ICD-10-CM | POA: Diagnosis not present

## 2024-03-31 DIAGNOSIS — L03115 Cellulitis of right lower limb: Secondary | ICD-10-CM | POA: Diagnosis not present

## 2024-03-31 DIAGNOSIS — I10 Essential (primary) hypertension: Secondary | ICD-10-CM | POA: Diagnosis not present

## 2024-03-31 DIAGNOSIS — D649 Anemia, unspecified: Secondary | ICD-10-CM | POA: Diagnosis not present

## 2024-03-31 DIAGNOSIS — S78112A Complete traumatic amputation at level between left hip and knee, initial encounter: Secondary | ICD-10-CM | POA: Diagnosis not present

## 2024-04-03 DIAGNOSIS — E1140 Type 2 diabetes mellitus with diabetic neuropathy, unspecified: Secondary | ICD-10-CM | POA: Diagnosis not present

## 2024-04-03 DIAGNOSIS — D649 Anemia, unspecified: Secondary | ICD-10-CM | POA: Diagnosis not present

## 2024-04-03 DIAGNOSIS — M19011 Primary osteoarthritis, right shoulder: Secondary | ICD-10-CM | POA: Diagnosis not present

## 2024-04-03 DIAGNOSIS — S78112A Complete traumatic amputation at level between left hip and knee, initial encounter: Secondary | ICD-10-CM | POA: Diagnosis not present

## 2024-04-03 DIAGNOSIS — I10 Essential (primary) hypertension: Secondary | ICD-10-CM | POA: Diagnosis not present

## 2024-04-03 DIAGNOSIS — K219 Gastro-esophageal reflux disease without esophagitis: Secondary | ICD-10-CM | POA: Diagnosis not present

## 2024-04-03 DIAGNOSIS — M869 Osteomyelitis, unspecified: Secondary | ICD-10-CM | POA: Diagnosis not present

## 2024-04-03 DIAGNOSIS — L03115 Cellulitis of right lower limb: Secondary | ICD-10-CM | POA: Diagnosis not present

## 2024-04-03 DIAGNOSIS — R600 Localized edema: Secondary | ICD-10-CM | POA: Diagnosis not present

## 2024-04-03 DIAGNOSIS — M6281 Muscle weakness (generalized): Secondary | ICD-10-CM | POA: Diagnosis not present

## 2024-04-03 DIAGNOSIS — F329 Major depressive disorder, single episode, unspecified: Secondary | ICD-10-CM | POA: Diagnosis not present

## 2024-04-06 DIAGNOSIS — E1140 Type 2 diabetes mellitus with diabetic neuropathy, unspecified: Secondary | ICD-10-CM | POA: Diagnosis not present

## 2024-04-06 DIAGNOSIS — L03115 Cellulitis of right lower limb: Secondary | ICD-10-CM | POA: Diagnosis not present

## 2024-04-06 DIAGNOSIS — D649 Anemia, unspecified: Secondary | ICD-10-CM | POA: Diagnosis not present

## 2024-04-06 DIAGNOSIS — M869 Osteomyelitis, unspecified: Secondary | ICD-10-CM | POA: Diagnosis not present

## 2024-04-06 DIAGNOSIS — F329 Major depressive disorder, single episode, unspecified: Secondary | ICD-10-CM | POA: Diagnosis not present

## 2024-04-06 DIAGNOSIS — K219 Gastro-esophageal reflux disease without esophagitis: Secondary | ICD-10-CM | POA: Diagnosis not present

## 2024-04-06 DIAGNOSIS — M6281 Muscle weakness (generalized): Secondary | ICD-10-CM | POA: Diagnosis not present

## 2024-04-06 DIAGNOSIS — M19011 Primary osteoarthritis, right shoulder: Secondary | ICD-10-CM | POA: Diagnosis not present

## 2024-04-06 DIAGNOSIS — R600 Localized edema: Secondary | ICD-10-CM | POA: Diagnosis not present

## 2024-04-06 DIAGNOSIS — I10 Essential (primary) hypertension: Secondary | ICD-10-CM | POA: Diagnosis not present

## 2024-04-06 DIAGNOSIS — S78112A Complete traumatic amputation at level between left hip and knee, initial encounter: Secondary | ICD-10-CM | POA: Diagnosis not present

## 2024-04-07 ENCOUNTER — Encounter (INDEPENDENT_AMBULATORY_CARE_PROVIDER_SITE_OTHER): Payer: Self-pay

## 2024-04-07 DIAGNOSIS — F331 Major depressive disorder, recurrent, moderate: Secondary | ICD-10-CM | POA: Diagnosis not present

## 2024-04-07 DIAGNOSIS — F5105 Insomnia due to other mental disorder: Secondary | ICD-10-CM | POA: Diagnosis not present

## 2024-04-07 DIAGNOSIS — F413 Other mixed anxiety disorders: Secondary | ICD-10-CM | POA: Diagnosis not present

## 2024-04-08 DIAGNOSIS — E1165 Type 2 diabetes mellitus with hyperglycemia: Secondary | ICD-10-CM | POA: Diagnosis not present

## 2024-04-08 DIAGNOSIS — S78112A Complete traumatic amputation at level between left hip and knee, initial encounter: Secondary | ICD-10-CM | POA: Diagnosis not present

## 2024-04-08 DIAGNOSIS — M869 Osteomyelitis, unspecified: Secondary | ICD-10-CM | POA: Diagnosis not present

## 2024-04-08 DIAGNOSIS — K219 Gastro-esophageal reflux disease without esophagitis: Secondary | ICD-10-CM | POA: Diagnosis not present

## 2024-04-08 DIAGNOSIS — I10 Essential (primary) hypertension: Secondary | ICD-10-CM | POA: Diagnosis not present

## 2024-04-08 DIAGNOSIS — F329 Major depressive disorder, single episode, unspecified: Secondary | ICD-10-CM | POA: Diagnosis not present

## 2024-04-08 DIAGNOSIS — E1140 Type 2 diabetes mellitus with diabetic neuropathy, unspecified: Secondary | ICD-10-CM | POA: Diagnosis not present

## 2024-04-09 DIAGNOSIS — F329 Major depressive disorder, single episode, unspecified: Secondary | ICD-10-CM | POA: Diagnosis not present

## 2024-04-09 DIAGNOSIS — S78112A Complete traumatic amputation at level between left hip and knee, initial encounter: Secondary | ICD-10-CM | POA: Diagnosis not present

## 2024-04-09 DIAGNOSIS — I10 Essential (primary) hypertension: Secondary | ICD-10-CM | POA: Diagnosis not present

## 2024-04-09 DIAGNOSIS — R600 Localized edema: Secondary | ICD-10-CM | POA: Diagnosis not present

## 2024-04-09 DIAGNOSIS — D649 Anemia, unspecified: Secondary | ICD-10-CM | POA: Diagnosis not present

## 2024-04-09 DIAGNOSIS — M869 Osteomyelitis, unspecified: Secondary | ICD-10-CM | POA: Diagnosis not present

## 2024-04-09 DIAGNOSIS — M6281 Muscle weakness (generalized): Secondary | ICD-10-CM | POA: Diagnosis not present

## 2024-04-09 DIAGNOSIS — K219 Gastro-esophageal reflux disease without esophagitis: Secondary | ICD-10-CM | POA: Diagnosis not present

## 2024-04-09 DIAGNOSIS — M19011 Primary osteoarthritis, right shoulder: Secondary | ICD-10-CM | POA: Diagnosis not present

## 2024-04-09 DIAGNOSIS — E1140 Type 2 diabetes mellitus with diabetic neuropathy, unspecified: Secondary | ICD-10-CM | POA: Diagnosis not present

## 2024-04-09 DIAGNOSIS — L03115 Cellulitis of right lower limb: Secondary | ICD-10-CM | POA: Diagnosis not present

## 2024-04-10 ENCOUNTER — Ambulatory Visit (INDEPENDENT_AMBULATORY_CARE_PROVIDER_SITE_OTHER): Admitting: Nurse Practitioner

## 2024-04-10 ENCOUNTER — Encounter (INDEPENDENT_AMBULATORY_CARE_PROVIDER_SITE_OTHER): Payer: Self-pay | Admitting: Nurse Practitioner

## 2024-04-10 VITALS — BP 119/83 | HR 90 | Resp 18 | Ht 70.0 in | Wt 192.0 lb

## 2024-04-10 DIAGNOSIS — Z89612 Acquired absence of left leg above knee: Secondary | ICD-10-CM

## 2024-04-13 ENCOUNTER — Encounter (INDEPENDENT_AMBULATORY_CARE_PROVIDER_SITE_OTHER): Payer: Self-pay | Admitting: Nurse Practitioner

## 2024-04-13 DIAGNOSIS — N186 End stage renal disease: Secondary | ICD-10-CM | POA: Diagnosis not present

## 2024-04-13 NOTE — Progress Notes (Addendum)
 Subjective:    Patient ID: Richard Mendoza, male    DOB: 1976-10-02, 48 y.o.   MRN: [id number] Chief Complaint  Patient presents with   Follow-up    1 month Follow Up    The patient returns today for evaluation of his left above-knee amputation.  Today it is currently well-healed but there was 1 remaining staple which was removed today.  No evidence of infection noted today.  He has been working with getting his new prosthetic.    Review of Systems  Neurological:  Positive for weakness.  All other systems reviewed and are negative.      Objective:    Physical Exam Vitals reviewed.  HENT:     Head: Normocephalic.  Cardiovascular:     Rate and Rhythm: Normal rate.  Pulmonary:     Effort: Pulmonary effort is normal.  Musculoskeletal:     Left Lower Extremity: Left leg is amputated above knee.  Skin:    General: Skin is warm and dry.  Neurological:     Mental Status: He is alert and oriented to person, place, and time.  Psychiatric:        Mood and Affect: Mood normal.        Behavior: Behavior normal.        Thought Content: Thought content normal.        Judgment: Judgment normal.     BP 119/83   Pulse 90   Resp 18   Ht 5' 10 (1.778 m)   Wt 192 lb (87.1 kg)   BMI 27.55 kg/m   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    Social History   Socioeconomic History   Marital status: Married    Spouse name: Not on file   Number of children: 4   Years of education: Not on file   Highest education level: Not on file  Occupational History   Not on file  Tobacco Use   Smoking status: Former    Current packs/day: 0.00    Average packs/day: 1 pack/day for 17.0 years (17.0 ttl pk-yrs)    Types: Cigarettes    Start date: 01/14/2002    Quit date: 2024    Years since quitting: 1.4   Smokeless tobacco: Never  Vaping  Use   Vaping status: Never Used  Substance and Sexual Activity   Alcohol use: Not Currently    Comment: rare   Drug use: No   Sexual activity: Yes  Other Topics Concern   Not on file  Social History Narrative   Not on file   Social Drivers of Health   Financial Resource Strain: Low Risk  (07/24/2022)   Received from [facility], [facility]   Overall Financial Resource Strain (CARDIA)    Difficulty of Paying Living Expenses: Not hard at all  Food Insecurity: No Food Insecurity (01/25/2024)   Hunger Vital Sign    Worried About Running Out of Food in the Last Year: Never true    Ran Out of Food in the Last Year: Never true  Transportation Needs: No Transportation Needs (01/25/2024)   PRAPARE - Administrator, Civil Service (Medical): No    Lack of Transportation (Non-Medical): No  Physical Activity: Not on file  Stress: No Stress Concern Present (07/24/2022)   Received from [facility], [facility]   [facility]  of Occupational Health - Occupational Stress Questionnaire    Feeling of Stress : Not at all  Social Connections: Unknown (03/21/2022)   Received from [facility], [facility]   Social Network    Social Network: Not on file  Intimate Partner Violence: Not At Risk (01/25/2024)   Humiliation, Afraid, Rape, and Kick questionnaire    Fear of Current or Ex-Partner: No    Emotionally Abused: No    Physically Abused: No    Sexually Abused: No    Past Surgical History:  Procedure Laterality Date   AMPUTATION Left 11/07/2020   Procedure: 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;  Surgeon: Neill Boas, DPM;  Location: [facility];  Service: Podiatry;  Laterality: Left;   AMPUTATION Left 11/16/2020   Procedure: AMPUTATION BELOW KNEE;  Surgeon: Marea Selinda RAMAN, MD;  Location: [facility];  Service: Vascular;  Laterality: Left;   AMPUTATION Left 01/29/2024   Procedure: AMPUTATION, ABOVE KNEE;  Surgeon: Marea Selinda RAMAN, MD;  Location: [facility];  Service:  General;  Laterality: Left;   ANTERIOR CERVICAL DECOMP/DISCECTOMY FUSION N/A 09/09/2017   Procedure: ANTERIOR CERVICAL DECOMPRESSION/DISCECTOMY FUSION CERVICAL 6- CERVICAL 7;  Surgeon: Gillie Duncans, MD;  Location: [facility];  Service: Neurosurgery;  Laterality: N/A;  ANTERIOR CERVICAL DECOMPRESSION/DISCECTOMY FUSION CERVICAL 6- CERVICAL 7   APPENDECTOMY     I & D EXTREMITY Right 10/03/2017   Procedure: IRRIGATION AND DEBRIDEMENT RIGHT WRIST;  Surgeon: Murrell Drivers, MD;  Location: [facility];  Service: Orthopedics;  Laterality: Right;   I & D EXTREMITY Right 11/26/2018   Procedure: IRRIGATION AND DEBRIDEMENT FASCIA ON RIGHT FOOT;  Surgeon: Ashley Soulier, DPM;  Location: [facility];  Service: Podiatry;  Laterality: Right;   INCISION AND DRAINAGE Right 03/06/2019   Procedure: INCISION AND DRAINAGE RIGHT FOOT, WITH 4th RAY AMPUTATION;  Surgeon: Ashley Soulier, DPM;  Location: [facility];  Service: Podiatry;  Laterality: Right;   INCISION AND DRAINAGE Left 11/07/2020   Procedure: INCISION AND DRAINAGE;  Surgeon: Neill Boas, DPM;  Location: [facility];  Service: Podiatry;  Laterality: Left;   INCISION AND DRAINAGE ABSCESS Left 05/02/2023   Procedure: INCISION AND DRAINAGE ABSCESS LEFT LOWER EXTREMITY;  Surgeon: Marea Selinda RAMAN, MD;  Location: [facility];  Service: Vascular;  Laterality: Left;   IRRIGATION AND DEBRIDEMENT FOOT Left 11/11/2020   Procedure: IRRIGATION AND DEBRIDEMENT FOOT;  Surgeon: Neill Boas, DPM;  Location: [facility];  Service: Podiatry;  Laterality: Left;   METATARSAL HEAD EXCISION Right 05/15/2019   Procedure: OSTECTOMY;MET HEAD 5;  Surgeon: Ashley Soulier, DPM;  Location: [facility];  Service: Podiatry;  Laterality: Right;   osteomylitis     ROTATOR CUFF REPAIR Left    SHOULDER ARTHROSCOPY WITH DEBRIDEMENT AND BICEP TENDON REPAIR Right 08/27/2023   Procedure: SHOULDER ARTHROSCOPY WITH INCISION AND DRAINAGE;  Surgeon: Tobie Priest, MD;  Location: [facility];  Service: Orthopedics;  Laterality: Right;     Family History  Problem Relation Age of Onset   Diabetes Mother    Heart disease Father    Diabetes Father    Arthritis Other    Hyperlipidemia Other    Hypertension Other    Cancer Other        breast   Mental illness Neg Hx     Allergies  Allergen Reactions   Oxycodone  Itching       Latest Ref Rng & Units 02/05/2024    5:42 AM 02/04/2024    3:35 AM 02/03/2024    6:40 AM  CBC  WBC 4.0 - 10.5 K/uL 9.2  9.7  10.2   Hemoglobin 13.0 - 17.0 g/dL 8.1  8.2  8.5   Hematocrit 39.0 - 52.0 % 24.6  24.7  25.4   Platelets 150 - 400 K/uL 297  297  286        CMP     Component Value Date/Time   NA 138 02/05/2024 0542   K 3.9 02/05/2024 0542   CL 108 02/05/2024 0542   CO2 25 02/05/2024 0542   GLUCOSE 187 (H) 02/05/2024 0542   BUN 31 (H) 02/05/2024 0542   CREATININE 2.09 (H) 02/05/2024 0542   CALCIUM  8.3 (L) 02/05/2024 0542   PROT 6.1 (L) 01/26/2024 0509   ALBUMIN 2.1 (L) 01/26/2024 0509   AST 12 (L) 01/26/2024 0509   ALT 11 01/26/2024 0509   ALKPHOS 73 01/26/2024 0509   BILITOT 0.6 01/26/2024 0509   GFR 118.24 10/24/2011 1654   GFRNONAA 39 (L) 02/05/2024 0542     No results found.     Assessment & Plan:   1. S/P AKA (above knee amputation), left (HCC) (Primary) The patient is doing well with his AKA.  He is fully healed.  Mr. Curl is a highly motivated K3 left above-knee amputee.  At this time his residual limb is well-healed and he is ready for prosthetic fitting.  I will be ordering left above-knee prosthesis with hydraulic knee and ankle and supplies.  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  This will accommodate his environment much better.  He is required to walk up and down inclines and on uneven surfaces.  I will also be ordering PT for prosthetic gait training once he is fitted with his new prosthesis.   Current Outpatient Medications on File Prior to Visit  Medication Sig Dispense Refill   acetaminophen  (TYLENOL ) 500 MG tablet  Take 1 tablet (500 mg total) by mouth 3 (three) times daily.     amLODipine  (NORVASC ) 10 MG tablet Take 1 tablet (10 mg total) by mouth every evening.     ascorbic acid  (VITAMIN C ) 500 MG tablet Take 1 tablet (500 mg total) by mouth daily.     benazepril  (LOTENSIN ) 40 MG tablet Take 40 mg by mouth daily.     buPROPion  (WELLBUTRIN  XL) 150 MG 24 hr tablet Take 150 mg by mouth daily.     cephALEXin  (KEFLEX ) 500 MG capsule Take 500 mg by mouth 3 (three) times daily.     Continuous Glucose Sensor (DEXCOM G7 SENSOR) MISC Inject 1 Application into the skin as directed. Change sensor every 10 days as directed. 9 each 3   ELIQUIS  5 MG TABS tablet Take 5 mg by mouth 2 (two) times daily.     enoxaparin  (LOVENOX ) 40 MG/0.4ML injection Inject 0.4 mLs (40 mg total) into the skin every evening.     fenofibrate  54 MG tablet Take 1 tablet (54 mg total) by mouth daily.     HYDROmorphone  (DILAUDID ) 2 MG tablet Take 0.5-1 tablets (1-2 mg total) by mouth every 4 (four) hours as needed for severe pain (pain score 7-10). 30 tablet 0   insulin  aspart (NOVOLOG ) 100 UNIT/ML injection Inject 10-16 Units into the skin 3 (three) times daily with meals. 90-150= 10 units; 151-200= 11 units; 201-250= 12 units; 251-300= 13 units; 301-350= 14 units; 351-400= 15 units; 401 or higher = 16 units 45 mL 3   insulin  glargine (LANTUS ) 100 UNIT/ML injection Inject 0.5 mLs (50 Units total) into the skin  at bedtime. 45 mL 3   INSULIN  SYRINGE .5CC/29G 29G X 1/2 0.5 ML MISC Use to inject insulin  4 times daily 200 each 11   iron  polysaccharides (NIFEREX) 150 MG capsule Take 1 capsule (150 mg total) by mouth daily. 30 capsule 0   methocarbamol  (ROBAXIN ) 500 MG tablet Take 1 tablet (500 mg total) by mouth every 8 (eight) hours as needed for muscle spasms.     omeprazole (PRILOSEC) 20 MG capsule Take 20 mg by mouth daily.     pregabalin  (LYRICA ) 50 MG capsule Take 1 capsule (50 mg total) by mouth 3 (three) times daily. (Patient taking differently:  Take 75 mg by mouth 3 (three) times daily.) 45 capsule 0   sodium bicarbonate  650 MG tablet Take 1 tablet (650 mg total) by mouth 2 (two) times daily.     traZODone  (DESYREL ) 100 MG tablet Take 100 mg by mouth at bedtime.     Vitamin D , Ergocalciferol , (DRISDOL ) 1.25 MG (50000 UNIT) CAPS capsule Take 1 capsule (50,000 Units total) by mouth every 7 (seven) days.     No current facility-administered medications on file prior to visit.    There are no Patient Instructions on file for this visit. No follow-ups on file.   Richard Mendoza E Isabella Roemmich, NP

## 2024-04-16 DIAGNOSIS — R739 Hyperglycemia, unspecified: Secondary | ICD-10-CM | POA: Diagnosis not present

## 2024-04-22 ENCOUNTER — Ambulatory Visit: Admitting: Nurse Practitioner

## 2024-04-23 ENCOUNTER — Encounter: Payer: Self-pay | Admitting: Nurse Practitioner

## 2024-04-24 DIAGNOSIS — M6281 Muscle weakness (generalized): Secondary | ICD-10-CM | POA: Diagnosis not present

## 2024-04-24 DIAGNOSIS — Z4781 Encounter for orthopedic aftercare following surgical amputation: Secondary | ICD-10-CM | POA: Diagnosis not present

## 2024-04-27 ENCOUNTER — Other Ambulatory Visit: Payer: Self-pay

## 2024-04-27 DIAGNOSIS — E1165 Type 2 diabetes mellitus with hyperglycemia: Secondary | ICD-10-CM

## 2024-04-27 MED ORDER — DEXCOM G7 SENSOR MISC: 1.0000 | 3 refills | Status: DC

## 2024-05-06 DIAGNOSIS — K219 Gastro-esophageal reflux disease without esophagitis: Secondary | ICD-10-CM | POA: Diagnosis not present

## 2024-05-06 DIAGNOSIS — Z79899 Other long term (current) drug therapy: Secondary | ICD-10-CM | POA: Diagnosis not present

## 2024-05-06 DIAGNOSIS — D638 Anemia in other chronic diseases classified elsewhere: Secondary | ICD-10-CM | POA: Diagnosis not present

## 2024-05-06 DIAGNOSIS — E559 Vitamin D deficiency, unspecified: Secondary | ICD-10-CM | POA: Diagnosis not present

## 2024-05-06 DIAGNOSIS — E1142 Type 2 diabetes mellitus with diabetic polyneuropathy: Secondary | ICD-10-CM | POA: Diagnosis not present

## 2024-05-06 DIAGNOSIS — G894 Chronic pain syndrome: Secondary | ICD-10-CM | POA: Diagnosis not present

## 2024-05-06 DIAGNOSIS — E569 Vitamin deficiency, unspecified: Secondary | ICD-10-CM | POA: Diagnosis not present

## 2024-05-06 DIAGNOSIS — I129 Hypertensive chronic kidney disease with stage 1 through stage 4 chronic kidney disease, or unspecified chronic kidney disease: Secondary | ICD-10-CM | POA: Diagnosis not present

## 2024-05-06 DIAGNOSIS — N1832 Chronic kidney disease, stage 3b: Secondary | ICD-10-CM | POA: Diagnosis not present

## 2024-05-06 DIAGNOSIS — F5104 Psychophysiologic insomnia: Secondary | ICD-10-CM | POA: Diagnosis not present

## 2024-05-06 DIAGNOSIS — F411 Generalized anxiety disorder: Secondary | ICD-10-CM | POA: Diagnosis not present

## 2024-05-06 DIAGNOSIS — F329 Major depressive disorder, single episode, unspecified: Secondary | ICD-10-CM | POA: Diagnosis not present

## 2024-05-12 DIAGNOSIS — M25511 Pain in right shoulder: Secondary | ICD-10-CM | POA: Diagnosis not present

## 2024-05-19 ENCOUNTER — Ambulatory Visit (INDEPENDENT_AMBULATORY_CARE_PROVIDER_SITE_OTHER): Payer: Self-pay | Admitting: Licensed Clinical Social Worker

## 2024-05-19 DIAGNOSIS — F332 Major depressive disorder, recurrent severe without psychotic features: Secondary | ICD-10-CM

## 2024-05-19 DIAGNOSIS — F411 Generalized anxiety disorder: Secondary | ICD-10-CM | POA: Diagnosis not present

## 2024-05-19 NOTE — Progress Notes (Signed)
 Comprehensive Clinical Assessment (CCA) Note  05/19/2024 Richard Mendoza [id number]  Chief Complaint:  Chief Complaint  Patient presents with   Depression   Anxiety   Medicall Stressors   Visit Diagnosis: Mood depressive disorder, recurrent severe generalized anxiety disorder  CCA Biopsychosocial Intake/Chief Complaint:  work on mental health symptoms. Diagnosed with depression. A lot to. Has had amputations. Started five years ago Amputated toe on right foot. Then a year later lost below the knee on left foot. Follow on for next 4 years constant infections hospital stays and surgeries which lead to the most recent amputation above the knee. patient diabetic. Very hard headed patient when came to diabetes never done about it until losing parts. Rough road for past five years. This last one in march just got out on rehab March 19 to June 9. Trazodone  on several years helps sleep. In rehab facility put on Wellbutrin  some but not much  Current Symptoms/Problems: anxiety, depression stressors   Patient Reported Schizophrenia/Schizoaffective Diagnosis in Past: No   Strengths: if wasn't at work doing something with the kids that was what life revolved work and the kids. Lately doesn't see personal qualities.  Preferences: see above  Abilities: doing Legos did [employer], likes the chameleon showed therapist a picture and therapist impressed, Holiday representative background a Proofreader, had own handyman business for eight years.   Type of Services Patient Feels are Needed: start with therapy go with one see how it goes   Initial Clinical Notes/Concerns: Mental health past treatment-awhile back meds through primary care before having problems with amputations. Waiting on a prosthetic. Once get that supposed to do outpatient physical therapy. Has kidney failure, high blood pressure-have everything but heart failure, circulation, severe neuropathy in both arms and one foot, T 6-7 to fuse together,  failed rotator cuff surgery on left arm, found out need on right arm high-can't do because on walker some to bathroom and back, high cholesterol. Kidney doctor, endocrinologist PCP, Surgeon who has done amputations. Referral to new PCP to pain clinic. Depression-cont-length 7-8 years. Family history-mom-bipolar, schizophrenic.   Mental Health Symptoms Depression:  Change in energy/activity; Fatigue; Difficulty Concentrating; Hopelessness; Increase/decrease in appetite; Weight gain/loss; Irritability; Sleep (too much or little); Tearfulness; Worthlessness (went from doing anything around the clock four boys run football, dirt bike racing since started took all this away.)   Duration of Depressive symptoms: Greater than two weeks   Mania:  None (at night can't calm down racing thoughts.)   Anxiety:   Difficulty concentrating; Fatigue; Irritability; Sleep; Worrying; Tension (stuck in a chair so hard to show restlessness but feels that way. Don't have body don't hurt on a daily basis.)   Psychosis:  None   Duration of Psychotic symptoms: n/a  Trauma:  -- (knows what want to do but what physically can't do like to be able to have old life.)   Obsessions:  None   Compulsions:  None   Inattention:  None   Hyperactivity/Impulsivity:  None   Oppositional/Defiant Behaviors:  None   Emotional Irregularity:  None   Other Mood/Personality Symptoms:  n/a   Mental Status Exam Appearance and self-care  Stature:  Average   Weight:  Average weight   Clothing:  Casual   Grooming:  Normal   Cosmetic use:  None   Posture/gait:  Normal   Motor activity:  Not Remarkable (in wheel chair uses a walker at home)   Sensorium  Attention:  Normal   Concentration:  Normal   Orientation:  X5   Recall/memory:  Normal   Affect and Mood  Affect:  Appropriate   Mood:  Anxious; Depressed   Relating  Eye contact:  Normal   Facial expression:  Responsive   Attitude toward examiner:   Cooperative   Thought and Language  Speech flow: Normal   Thought content:  Appropriate to Mood and Circumstances   Preoccupation:  None   Hallucinations:  None   Organization:  No data recorded  [employer] of Knowledge:  Average   Intelligence:  Average   Abstraction:  Normal   Judgement:  Fair   Dance movement psychotherapist:  Realistic   Insight:  Fair   Decision Making:  Normal (day to day decisions do pretty good)   Social Functioning  Social Maturity:  Isolates   Social Judgement:  Normal   Stress  Stressors:  Illness; Work; Surveyor, quantity; Family conflict (lack of ability to go to work. lack of being able to do things with family)   Coping Ability:  Overwhelmed; Exhausted   Skill Deficits:  -- (work on everything)   Supports:  Support needed (lives with older son)     Religion: Religion/Spirituality Are You A Religious Person?: No How Might This Affect Treatment?: n/a  Leisure/Recreation: Leisure / Recreation Do You Have Hobbies?: Yes Leisure and Hobbies: see above  Exercise/Diet: Exercise/Diet Do You Exercise?: No Have You Gained or Lost A Significant Amount of Weight in the Past Six Months?: Yes-Lost Number of Pounds Lost?: 20 Do You Follow a Special Diet?: No Do You Have Any Trouble Sleeping?: Yes Explanation of Sleeping Difficulties: getting to sleep, constant different thoughts running can't shut it off   CCA Employment/Education Employment/Work Situation: Employment / Work Situation Employment Situation:  (trying to get disability since 2017 denied, denied a little over year. 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?: Yes (Describe in comment) ([employer]-other than honorable discharge) Did You Receive Any Psychiatric Treatment/Services While in the [employer]?: No  Education: Education Is Patient Currently Attending School?: No Last Grade Completed: 12 Name of High School: [employer] Did Garment/textile technologist From [employer]?: Yes Did Theme park manager?: No Did You Attend Graduate School?: No Did You Have Any Special Interests In School?: science, math Did You Have An Individualized Education Program (IIEP): No Did You Have Any Difficulty At School?: No Patient's Education Has Been Impacted by Current Illness: No   CCA Family/Childhood History Family and Relationship History: Family history Marital status: Separated Separated, when?: since November-married 20 years What types of issues is patient dealing with in the relationship?: patient is trying to make it work can't tell if she is. More racing thoughts and confusion. She initiated it. Believes because of medical issues. Are you sexually active?: No What is your sexual orientation?: heterosexual Has your sexual activity been affected by drugs, alcohol, medication, or emotional stress?: n/a Does patient have children?: Yes How many children?: 4 How is patient's relationship with their children?: 24-Ryan, Zachary-20, Nick-19, Levi-10-living with older so hates him being there comments he makes the  other two haven't seen much since separation. Youngest one he is the odd one she accuses of treating differently. Not like the other boys not in sports, he is into video games understand but not his thing so having trouble connecting to hm. Hard when don't see too much. Saw Karleen first time since April. Zach haven't see since May, Levy-Father's Day last seen.  Childhood History:  Childhood History By whom was/is the patient raised?: Both  parents Additional childhood history information: good Description of patient's relationship with caregiver when they were a child: good Patient's description of current relationship with people who raised him/her: Dad's decease, Mom brought him today his support. Last amputation see her every day helps every day. How were you disciplined when you got in trouble as a child/adolescent?: normal. Does patient have siblings?: Yes Number of Siblings: 1 Description of patient's current relationship with siblings: younger sister three years younger not always really close, grew up [place]  mom and sister stayed out there when joined the service got them back here 10 years ago. Probably spend time with Mom since March since whole time been here. He has been here since 2001 Did patient suffer any verbal/emotional/physical/sexual abuse as a child?: No Did patient suffer from severe childhood neglect?: No Has patient ever been sexually abused/assaulted/raped as an adolescent or adult?: No Was the patient ever a victim of a crime or a disaster?: No Witnessed domestic violence?: No Has patient been affected by domestic violence as an adult?: No  Child/Adolescent Assessment: n/a     CCA Substance Use Alcohol/Drug Use: Alcohol / Drug Use Pain Medications: see med list-2 mg Dialudid-every eight hours Prescriptions: see med list Over the Counter: see mist History of alcohol / drug use?: No history of alcohol / drug abuse                         ASAM's:  Six Dimensions of Multidimensional Assessment  Dimension 1:  Acute Intoxication and/or Withdrawal Potential:      Dimension 2:  Biomedical Conditions and Complications:      Dimension 3:  Emotional, Behavioral, or Cognitive Conditions and Complications:     Dimension 4:  Readiness to Change:     Dimension 5:  Relapse, Continued use, or Continued Problem Potential:     Dimension 6:  Recovery/Living Environment:     ASAM Severity  Score:    ASAM Recommended Level of Treatment:     Substance use Disorder (SUD)-n/a    Recommendations for Services/Supports/Treatments: Recommendations for Services/Supports/Treatments Recommendations For Services/Supports/Treatments: Medication Management, Individual Therapy  DSM5 Diagnoses: Patient Active Problem List   Diagnosis Date Noted   S/P AKA (above knee amputation), left (HCC) 02/04/2024   Cellulitis 01/23/2024   Staphylococcal arthritis of right shoulder (HCC) 08/29/2023   Bacteremia due to Staphylococcus aureus 08/29/2023   Acute bursitis of right shoulder 08/24/2023   Iron  deficiency anemia, unspecified 08/23/2023   Overweight (BMI 25.0-29.9) 08/22/2023   MRSA bacteremia 08/22/2023   Amputation stump infection (HCC) 08/20/2023   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) 06/01/2023   Chronic kidney disease, stage 3a (HCC) 06/01/2023   Tobacco abuse 06/01/2023   Cellulitis and abscess of left leg 04/30/2023   Cellulitis of left lower extremity 04/29/2023   Tobacco use 04/29/2023   Carpal tunnel syndrome 07/12/2022   Hyperglycemia due to type 2 diabetes mellitus (HCC) 07/12/2022   Polyneuropathy 07/12/2022   Ulnar neuropathy 07/12/2022   Acute epigastric pain  07/12/2022   Gastro-esophageal reflux disease without esophagitis 07/12/2022   Nausea and vomiting 07/12/2022   Abscess of right upper extremity 02/26/2022   Cellulitis of left lower extremity without foot    Sepsis (HCC) 09/08/2021   Hypokalemia 09/06/2021   Hyponatremia 09/06/2021   PAD (peripheral artery disease) (HCC) 05/26/2021   Acute kidney injury superimposed on chronic kidney disease (HCC) 04/03/2021   Syncope 04/03/2021   Neck pain    Abnormal weight loss 12/09/2020   Change in bowel habit 12/09/2020   Constipation 12/09/2020   Epigastric pain 12/09/2020   Drug-induced constipation 12/09/2020   Periumbilical pain 12/09/2020   Fatty liver 12/09/2020   S/P BKA (below knee  amputation), left (HCC) 11/20/2020   Necrotizing fasciitis of ankle and foot (HCC) 11/07/2020   History of 2019 novel coronavirus disease (COVID-19) 09/01/2020   Major depressive disorder, recurrent, in remission (HCC) 09/01/2020   Pain management contract signed 09/07/2019   Evaluation by psychiatric service required 08/18/2019   History of depression 08/18/2019   Primary osteoarthritis of both shoulders 07/16/2019   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) 07/16/2019   Chronic pain of both shoulders 07/16/2019   Cervical radicular pain 07/16/2019   S/P cervical spinal fusion 07/16/2019   Cervical spondylosis 07/16/2019   Cervical facet joint syndrome 07/16/2019   Chronic pain syndrome 07/16/2019   Anemia of chronic disease 05/25/2019   Osteomyelitis (HCC) 05/25/2019   Diabetic foot infection (HCC) 03/05/2019   Abscess 11/25/2018   Diabetic peripheral neuropathy associated with type 2 diabetes mellitus (HCC) 06/16/2018   Hyperlipidemia associated with type 2 diabetes mellitus (HCC) 06/16/2018   Intractable nausea and vomiting 10/21/2017   HNP (herniated nucleus pulposus), cervical 09/09/2017   Displacement of cervical intervertebral disc without myelopathy 08/27/2017   Erectile dysfunction 07/31/2017   Gastroesophageal reflux disease 07/31/2017   Rotator cuff syndrome of left shoulder 07/31/2017   Cellulitis of right leg 07/30/2016   Tobacco abuse disorder 07/30/2016   Routine general medical examination at a health care facility 01/20/2014   Diabetic neuropathy, type I diabetes mellitus (HCC) 01/20/2014   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   HLD (hyperlipidemia) 03/17/2009   DM type 2 with diabetic peripheral neuropathy (HCC) 03/02/2009   Uncontrolled diabetes mellitus with hyperglycemia (HCC) 12/08/2008   OBSTRUCTIVE SLEEP APNEA 12/08/2008   Essential hypertension 12/08/2008   Allergic rhinitis 12/15/2007   Other malaise and fatigue 12/15/2007    Patient Centered  Plan: Patient is on the following Treatment Plan(s):  Anxiety, Depression, and Low Self-Esteem-patient has severe medical issues limited support we will work on helping him cope with medical issues with management of mental health Patient participated in the development of this treatment plan and verbalized his understanding/agreement with plan as listed.  Developed a safety plan patient will call 911 or [phone] go to local emergency room talk to his supports if symptoms escalate  Referrals to Alternative Service(s): Referred to Alternative Service(s):   Place:   Date:   Time:    Referred to Alternative Service(s):   Place:   Date:   Time:    Referred to Alternative Service(s):   Place:   Date:   Time:    Referred to Alternative Service(s):   Place:   Date:   Time:      Collaboration of Care: Other review of referral note from rehab as well as patient's completion of paperwork  Patient/Guardian was advised Release of Information must be obtained prior to any record release in order to collaborate their  care with an outside provider. Patient/Guardian was advised if they have not already done so to contact the registration department to sign all necessary forms in order for us  to release information regarding their care.   Consent: Patient/Guardian gives verbal consent for treatment and assignment of benefits for services provided during this visit. Patient/Guardian expressed understanding and agreed to proceed.   [name], LCSW

## 2024-05-23 ENCOUNTER — Inpatient Hospital Stay
Admission: EM | Admit: 2024-05-23 | Discharge: 2024-05-26 | DRG: 641 | Disposition: A | Discharge status: Home / Self Care | Attending: Internal Medicine | Admitting: Internal Medicine

## 2024-05-23 ENCOUNTER — Emergency Department

## 2024-05-23 ENCOUNTER — Other Ambulatory Visit: Payer: Self-pay

## 2024-05-23 DIAGNOSIS — R197 Diarrhea, unspecified: Secondary | ICD-10-CM | POA: Diagnosis not present

## 2024-05-23 DIAGNOSIS — R739 Hyperglycemia, unspecified: Secondary | ICD-10-CM | POA: Diagnosis not present

## 2024-05-23 DIAGNOSIS — E1142 Type 2 diabetes mellitus with diabetic polyneuropathy: Secondary | ICD-10-CM | POA: Diagnosis present

## 2024-05-23 DIAGNOSIS — K5903 Drug induced constipation: Secondary | ICD-10-CM | POA: Diagnosis present

## 2024-05-23 DIAGNOSIS — I10 Essential (primary) hypertension: Secondary | ICD-10-CM | POA: Diagnosis present

## 2024-05-23 DIAGNOSIS — N179 Acute kidney failure, unspecified: Secondary | ICD-10-CM | POA: Diagnosis not present

## 2024-05-23 DIAGNOSIS — R112 Nausea with vomiting, unspecified: Secondary | ICD-10-CM | POA: Diagnosis present

## 2024-05-23 DIAGNOSIS — Z87891 Personal history of nicotine dependence: Secondary | ICD-10-CM

## 2024-05-23 DIAGNOSIS — Z79899 Other long term (current) drug therapy: Secondary | ICD-10-CM

## 2024-05-23 DIAGNOSIS — E861 Hypovolemia: Secondary | ICD-10-CM | POA: Diagnosis present

## 2024-05-23 DIAGNOSIS — Z89512 Acquired absence of left leg below knee: Secondary | ICD-10-CM

## 2024-05-23 DIAGNOSIS — Z981 Arthrodesis status: Secondary | ICD-10-CM

## 2024-05-23 DIAGNOSIS — E1165 Type 2 diabetes mellitus with hyperglycemia: Secondary | ICD-10-CM | POA: Diagnosis present

## 2024-05-23 DIAGNOSIS — Z885 Allergy status to narcotic agent status: Secondary | ICD-10-CM

## 2024-05-23 DIAGNOSIS — Z794 Long term (current) use of insulin: Secondary | ICD-10-CM

## 2024-05-23 DIAGNOSIS — E785 Hyperlipidemia, unspecified: Secondary | ICD-10-CM | POA: Diagnosis present

## 2024-05-23 DIAGNOSIS — E1151 Type 2 diabetes mellitus with diabetic peripheral angiopathy without gangrene: Secondary | ICD-10-CM | POA: Diagnosis present

## 2024-05-23 DIAGNOSIS — K219 Gastro-esophageal reflux disease without esophagitis: Secondary | ICD-10-CM | POA: Diagnosis present

## 2024-05-23 DIAGNOSIS — N189 Chronic kidney disease, unspecified: Secondary | ICD-10-CM | POA: Diagnosis present

## 2024-05-23 DIAGNOSIS — E871 Hypo-osmolality and hyponatremia: Secondary | ICD-10-CM | POA: Diagnosis present

## 2024-05-23 DIAGNOSIS — Z8249 Family history of ischemic heart disease and other diseases of the circulatory system: Secondary | ICD-10-CM

## 2024-05-23 DIAGNOSIS — E860 Dehydration: Secondary | ICD-10-CM | POA: Diagnosis not present

## 2024-05-23 DIAGNOSIS — I739 Peripheral vascular disease, unspecified: Secondary | ICD-10-CM | POA: Diagnosis present

## 2024-05-23 DIAGNOSIS — N1831 Chronic kidney disease, stage 3a: Secondary | ICD-10-CM | POA: Diagnosis present

## 2024-05-23 DIAGNOSIS — I129 Hypertensive chronic kidney disease with stage 1 through stage 4 chronic kidney disease, or unspecified chronic kidney disease: Secondary | ICD-10-CM | POA: Diagnosis present

## 2024-05-23 DIAGNOSIS — K5900 Constipation, unspecified: Secondary | ICD-10-CM | POA: Diagnosis present

## 2024-05-23 DIAGNOSIS — Z833 Family history of diabetes mellitus: Secondary | ICD-10-CM

## 2024-05-23 DIAGNOSIS — Z7901 Long term (current) use of anticoagulants: Secondary | ICD-10-CM

## 2024-05-23 DIAGNOSIS — T402X5A Adverse effect of other opioids, initial encounter: Secondary | ICD-10-CM | POA: Diagnosis present

## 2024-05-23 DIAGNOSIS — E1122 Type 2 diabetes mellitus with diabetic chronic kidney disease: Secondary | ICD-10-CM | POA: Diagnosis present

## 2024-05-23 HISTORY — DX: Encounter for other specified aftercare: Z51.89

## 2024-05-23 HISTORY — DX: Disorder of kidney and ureter, unspecified: N28.9

## 2024-05-23 LAB — COMPREHENSIVE METABOLIC PANEL WITH GFR
ALT: 10 U/L (ref 0–44)
AST: 20 U/L (ref 15–41)
Albumin: 3.7 g/dL (ref 3.5–5.0)
Alkaline Phosphatase: 91 U/L (ref 38–126)
Anion gap: 8 (ref 5–15)
BUN: 54 mg/dL — ABNORMAL HIGH (ref 6–20)
CO2: 21 mmol/L — ABNORMAL LOW (ref 22–32)
Calcium: 8.7 mg/dL — ABNORMAL LOW (ref 8.9–10.3)
Chloride: 95 mmol/L — ABNORMAL LOW (ref 98–111)
Creatinine, Ser: 3 mg/dL — ABNORMAL HIGH (ref 0.61–1.24)
GFR, Estimated: 25 mL/min — ABNORMAL LOW (ref >60)
Glucose, Bld: 506 mg/dL (ref 70–99)
Potassium: 5.1 mmol/L (ref 3.5–5.1)
Sodium: 124 mmol/L — ABNORMAL LOW (ref 135–145)
Total Bilirubin: 1.4 mg/dL — ABNORMAL HIGH (ref 0.0–1.2)
Total Protein: 7.1 g/dL (ref 6.5–8.1)

## 2024-05-23 LAB — CBG MONITORING, ED
Glucose-Capillary: 464 mg/dL — ABNORMAL HIGH (ref 70–99)
Glucose-Capillary: 523 mg/dL (ref 70–99)
Glucose-Capillary: 300 mg/dL — ABNORMAL HIGH (ref 70–99)
Glucose-Capillary: 261 mg/dL — ABNORMAL HIGH (ref 70–99)

## 2024-05-23 LAB — URINALYSIS, ROUTINE W REFLEX MICROSCOPIC
Bacteria, UA: NONE SEEN
Bilirubin Urine: NEGATIVE
Glucose, UA: >=500 mg/dL — AB
Ketones, ur: NEGATIVE mg/dL
Leukocytes,Ua: NEGATIVE
Nitrite: NEGATIVE
Protein, ur: >=300 mg/dL — AB
Specific Gravity, Urine: 1.02 (ref 1.005–1.030)
pH: 5 (ref 5.0–8.0)

## 2024-05-23 LAB — BASIC METABOLIC PANEL WITH GFR
Anion gap: 4 — ABNORMAL LOW (ref 5–15)
BUN: 51 mg/dL — ABNORMAL HIGH (ref 6–20)
CO2: 21 mmol/L — ABNORMAL LOW (ref 22–32)
Calcium: 8 mg/dL — ABNORMAL LOW (ref 8.9–10.3)
Chloride: 104 mmol/L (ref 98–111)
Creatinine, Ser: 2.71 mg/dL — ABNORMAL HIGH (ref 0.61–1.24)
GFR, Estimated: 28 mL/min — ABNORMAL LOW (ref >60)
Glucose, Bld: 248 mg/dL — ABNORMAL HIGH (ref 70–99)
Potassium: 3.6 mmol/L (ref 3.5–5.1)
Sodium: 129 mmol/L — ABNORMAL LOW (ref 135–145)

## 2024-05-23 LAB — CBC
HCT: 38.5 % — ABNORMAL LOW (ref 39.0–52.0)
Hemoglobin: 13 g/dL (ref 13.0–17.0)
MCH: 24.7 pg — ABNORMAL LOW (ref 26.0–34.0)
MCHC: 33.8 g/dL (ref 30.0–36.0)
MCV: 73.2 fL — ABNORMAL LOW (ref 80.0–100.0)
Platelets: 298 K/uL (ref 150–400)
RBC: 5.26 MIL/uL (ref 4.22–5.81)
RDW: 13.9 % (ref 11.5–15.5)
WBC: 9.7 K/uL (ref 4.0–10.5)
nRBC: 0 % (ref 0.0–0.2)

## 2024-05-23 LAB — TSH: TSH: 0.581 u[IU]/mL (ref 0.350–4.500)

## 2024-05-23 LAB — GLUCOSE, CAPILLARY: Glucose-Capillary: 268 mg/dL — ABNORMAL HIGH (ref 70–99)

## 2024-05-23 LAB — BETA-HYDROXYBUTYRIC ACID: Beta-Hydroxybutyric Acid: 0.39 mmol/L — ABNORMAL HIGH (ref 0.05–0.27)

## 2024-05-23 LAB — LIPASE, BLOOD: Lipase: 59 U/L — ABNORMAL HIGH (ref 11–51)

## 2024-05-23 MED ORDER — PANTOPRAZOLE SODIUM 40 MG PO TBEC
40.0000 mg | DELAYED_RELEASE_TABLET | Freq: Every day | ORAL | Status: DC
  Administered 2024-05-24 – 2024-05-26 (×3): 40 mg via ORAL
  Filled 2024-05-23 (×3): qty 1

## 2024-05-23 MED ORDER — BUPROPION HCL ER (XL) 150 MG PO TB24: 150.0000 mg | ORAL_TABLET | Freq: Every day | ORAL | Status: DC

## 2024-05-23 MED ORDER — ONDANSETRON HCL 4 MG/2ML IJ SOLN
4.0000 mg | Freq: Once | INTRAMUSCULAR | Status: AC
  Administered 2024-05-23: 4 mg via INTRAVENOUS
  Filled 2024-05-23: qty 2

## 2024-05-23 MED ORDER — FENTANYL CITRATE PF 50 MCG/ML IJ SOSY: 12.5000 ug | PREFILLED_SYRINGE | INTRAMUSCULAR | Status: DC | PRN

## 2024-05-23 MED ORDER — ONDANSETRON HCL 4 MG/2ML IJ SOLN
4.0000 mg | Freq: Four times a day (QID) | INTRAMUSCULAR | Status: DC | PRN
  Filled 2024-05-23: qty 2

## 2024-05-23 MED ORDER — INSULIN ASPART 100 UNIT/ML IJ SOLN
0.0000 [IU] | Freq: Every day | INTRAMUSCULAR | Status: DC
  Administered 2024-05-23 – 2024-05-25 (×2): 3 [IU] via SUBCUTANEOUS
  Filled 2024-05-23 (×2): qty 1

## 2024-05-23 MED ORDER — POLYSACCHARIDE IRON COMPLEX 150 MG PO CAPS: 150.0000 mg | ORAL_CAPSULE | Freq: Every day | ORAL | Status: DC

## 2024-05-23 MED ORDER — LACTATED RINGERS IV SOLN
INTRAVENOUS | Status: AC
Start: 2024-05-23 — End: 2024-05-24

## 2024-05-23 MED ORDER — ACETAMINOPHEN 500 MG PO TABS
500.0000 mg | ORAL_TABLET | Freq: Three times a day (TID) | ORAL | Status: DC
  Administered 2024-05-23 – 2024-05-26 (×8): 500 mg via ORAL
  Filled 2024-05-23 (×8): qty 1

## 2024-05-23 MED ORDER — VITAMIN C 500 MG PO TABS
500.0000 mg | ORAL_TABLET | Freq: Every day | ORAL | Status: DC
  Administered 2024-05-24 – 2024-05-26 (×3): 500 mg via ORAL
  Filled 2024-05-23 (×3): qty 1

## 2024-05-23 MED ORDER — VITAMIN D (ERGOCALCIFEROL) 1.25 MG (50000 UNIT) PO CAPS
50000.0000 [IU] | ORAL_CAPSULE | ORAL | Status: DC
  Filled 2024-05-23: qty 1

## 2024-05-23 MED ORDER — SODIUM BICARBONATE 650 MG PO TABS
650.0000 mg | ORAL_TABLET | Freq: Two times a day (BID) | ORAL | Status: DC
  Administered 2024-05-23 – 2024-05-26 (×6): 650 mg via ORAL
  Filled 2024-05-23 (×6): qty 1

## 2024-05-23 MED ORDER — METHOCARBAMOL 500 MG PO TABS: 500.0000 mg | ORAL_TABLET | Freq: Three times a day (TID) | ORAL | Status: DC | PRN

## 2024-05-23 MED ORDER — INSULIN GLARGINE-YFGN 100 UNIT/ML ~~LOC~~ SOLN
50.0000 [IU] | Freq: Every day | SUBCUTANEOUS | Status: DC
  Administered 2024-05-23 – 2024-05-25 (×3): 50 [IU] via SUBCUTANEOUS
  Filled 2024-05-23 (×3): qty 0.5

## 2024-05-23 MED ORDER — DEXCOM G7 SENSOR MISC: 1.0000 | Status: DC

## 2024-05-23 MED ORDER — FENTANYL CITRATE PF 50 MCG/ML IJ SOSY
50.0000 ug | PREFILLED_SYRINGE | Freq: Once | INTRAMUSCULAR | Status: AC
  Administered 2024-05-23: 50 ug via INTRAVENOUS
  Filled 2024-05-23: qty 1

## 2024-05-23 MED ORDER — NICOTINE 14 MG/24HR TD PT24: 14.0000 mg | MEDICATED_PATCH | Freq: Every day | TRANSDERMAL | Status: DC

## 2024-05-23 MED ORDER — ACETAMINOPHEN 325 MG PO TABS: 650.0000 mg | ORAL_TABLET | Freq: Four times a day (QID) | ORAL | Status: DC | PRN

## 2024-05-23 MED ORDER — TRAZODONE HCL 100 MG PO TABS
100.0000 mg | ORAL_TABLET | Freq: Every day | ORAL | Status: DC
  Administered 2024-05-23 – 2024-05-25 (×3): 100 mg via ORAL
  Filled 2024-05-23 (×3): qty 1

## 2024-05-23 MED ORDER — HYDROMORPHONE HCL 2 MG PO TABS
1.0000 mg | ORAL_TABLET | ORAL | Status: DC | PRN
  Administered 2024-05-24 – 2024-05-26 (×6): 2 mg via ORAL
  Filled 2024-05-23 (×6): qty 1

## 2024-05-23 MED ORDER — LIDOCAINE VISCOUS HCL 2 % MT SOLN
15.0000 mL | Freq: Once | OROMUCOSAL | Status: AC
  Administered 2024-05-23: 15 mL via OROMUCOSAL
  Filled 2024-05-23: qty 15

## 2024-05-23 MED ORDER — PROCHLORPERAZINE EDISYLATE 10 MG/2ML IJ SOLN
5.0000 mg | Freq: Once | INTRAMUSCULAR | Status: AC
  Administered 2024-05-23: 5 mg via INTRAVENOUS
  Filled 2024-05-23: qty 2

## 2024-05-23 MED ORDER — METOPROLOL TARTRATE 5 MG/5ML IV SOLN
5.0000 mg | Freq: Four times a day (QID) | INTRAVENOUS | Status: DC | PRN
  Administered 2024-05-25: 5 mg via INTRAVENOUS
  Filled 2024-05-23: qty 5

## 2024-05-23 MED ORDER — INSULIN ASPART 100 UNIT/ML IJ SOLN
15.0000 [IU] | Freq: Once | INTRAMUSCULAR | Status: AC
  Administered 2024-05-23: 15 [IU] via INTRAVENOUS
  Filled 2024-05-23: qty 1

## 2024-05-23 MED ORDER — APIXABAN 5 MG PO TABS
5.0000 mg | ORAL_TABLET | Freq: Two times a day (BID) | ORAL | Status: DC
  Administered 2024-05-23 – 2024-05-26 (×6): 5 mg via ORAL
  Filled 2024-05-23 (×6): qty 1

## 2024-05-23 MED ORDER — SODIUM CHLORIDE 0.9 % IV BOLUS
1000.0000 mL | Freq: Once | INTRAVENOUS | Status: AC
  Administered 2024-05-23: 1000 mL via INTRAVENOUS

## 2024-05-23 MED ORDER — BENAZEPRIL HCL 20 MG PO TABS
40.0000 mg | ORAL_TABLET | Freq: Every day | ORAL | Status: DC
  Administered 2024-05-24 – 2024-05-26 (×3): 40 mg via ORAL
  Filled 2024-05-23 (×4): qty 2

## 2024-05-23 MED ORDER — ACETAMINOPHEN 650 MG RE SUPP: 650.0000 mg | Freq: Four times a day (QID) | RECTAL | Status: DC | PRN

## 2024-05-23 MED ORDER — SENNA 8.6 MG PO TABS
1.0000 | ORAL_TABLET | Freq: Two times a day (BID) | ORAL | Status: DC
  Administered 2024-05-23 – 2024-05-25 (×4): 8.6 mg via ORAL
  Filled 2024-05-23 (×4): qty 1

## 2024-05-23 MED ORDER — MORPHINE SULFATE (PF) 4 MG/ML IV SOLN
4.0000 mg | Freq: Once | INTRAVENOUS | Status: AC
  Administered 2024-05-23: 4 mg via INTRAVENOUS
  Filled 2024-05-23: qty 1

## 2024-05-23 MED ORDER — ONDANSETRON HCL 4 MG PO TABS
4.0000 mg | ORAL_TABLET | Freq: Four times a day (QID) | ORAL | Status: DC | PRN
  Administered 2024-05-23 – 2024-05-24 (×2): 4 mg via ORAL
  Filled 2024-05-23 (×2): qty 1

## 2024-05-23 MED ORDER — BISACODYL 10 MG RE SUPP: 10.0000 mg | Freq: Every day | RECTAL | Status: DC | PRN

## 2024-05-23 MED ORDER — ALUM & MAG HYDROXIDE-SIMETH 200-200-20 MG/5ML PO SUSP
30.0000 mL | Freq: Once | ORAL | Status: AC
  Administered 2024-05-23: 30 mL via ORAL
  Filled 2024-05-23: qty 30

## 2024-05-23 MED ORDER — INSULIN ASPART 100 UNIT/ML IJ SOLN: 10.0000 [IU] | Freq: Three times a day (TID) | INTRAMUSCULAR | Status: DC

## 2024-05-23 MED ORDER — FENOFIBRATE 54 MG PO TABS
54.0000 mg | ORAL_TABLET | Freq: Every day | ORAL | Status: DC
  Administered 2024-05-24 – 2024-05-26 (×3): 54 mg via ORAL
  Filled 2024-05-23 (×3): qty 1

## 2024-05-23 MED ORDER — AMLODIPINE BESYLATE 5 MG PO TABS: 10.0000 mg | ORAL_TABLET | Freq: Every evening | ORAL | Status: DC

## 2024-05-23 MED ORDER — POLYETHYLENE GLYCOL 3350 17 G PO PACK
17.0000 g | PACK | Freq: Every day | ORAL | Status: DC | PRN
  Administered 2024-05-25: 17 g via ORAL
  Filled 2024-05-23: qty 1

## 2024-05-23 MED ORDER — INSULIN ASPART 100 UNIT/ML IJ SOLN
0.0000 [IU] | Freq: Three times a day (TID) | INTRAMUSCULAR | Status: DC
  Administered 2024-05-23: 5 [IU] via SUBCUTANEOUS
  Administered 2024-05-24 (×2): 7 [IU] via SUBCUTANEOUS
  Administered 2024-05-24: 5 [IU] via SUBCUTANEOUS
  Administered 2024-05-25: 7 [IU] via SUBCUTANEOUS
  Administered 2024-05-25: 5 [IU] via SUBCUTANEOUS
  Administered 2024-05-25 – 2024-05-26 (×2): 7 [IU] via SUBCUTANEOUS
  Filled 2024-05-23 (×9): qty 1

## 2024-05-23 MED ORDER — PREGABALIN 50 MG PO CAPS
75.0000 mg | ORAL_CAPSULE | Freq: Three times a day (TID) | ORAL | Status: DC
  Administered 2024-05-23 – 2024-05-26 (×8): 75 mg via ORAL
  Filled 2024-05-23 (×8): qty 1

## 2024-05-23 NOTE — Hospital Course (Signed)
 Patient is a 48 year old with longstanding DM, HTN, HLD, CKD 3A, peripheral neuropathy status post AKA of left leg, chronic opioid use and significant constipation who presents with an 8-day history of constipation.  He is taken multiple stool softeners and laxatives to no avail.  Over the last 4 days he has been unable to tolerate anything by mouth.  He reports significant nausea vomiting as well as epigastric pain.  Patient reports significant hyperglycemia at home despite increasing doses of short acting insulin .  In the ED patient was noted to have acute kidney injury as well as dehydration.  He was given 2 L of fluids and antiemetics and insulin  and his sugar came down from 506-248.  Initial creatinine was 3 up from a baseline of approximately 2 but did improve to 2.7 with IV fluids.  CT of abdomen was negative.

## 2024-05-23 NOTE — Assessment & Plan Note (Signed)
 PPI ?

## 2024-05-23 NOTE — Assessment & Plan Note (Signed)
 Continue amlodipine  Continue BenzePrO

## 2024-05-23 NOTE — Assessment & Plan Note (Signed)
 Likely secondary to dehydration on top of chronic kidney disease related to HTN and DM Avoid nephrotoxic agents Trend IV fluid resuscitation will likely continue to help this.

## 2024-05-23 NOTE — Assessment & Plan Note (Signed)
 Continue fenofibrate  Status post AKA of left knee awaiting prosthetic

## 2024-05-23 NOTE — H&P (Signed)
 History and Physical    Patient: Richard Mendoza FMW:[id number] DOB: 07-19-1976 DOA: 05/23/2024 DOS: the patient was seen and examined on 05/23/2024 PCP: Carles Seltzer, MD  Patient coming from: Home  Chief Complaint:  Chief Complaint  Patient presents with   Abdominal Pain   Emesis   Constipation   HPI: Richard Mendoza is a 48 y.o. 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  He is taken multiple stool softeners and laxatives to no avail.  Over the last 4 days he has been unable to tolerate anything by mouth.  He reports significant nausea vomiting as well as epigastric pain.  Patient reports significant hyperglycemia at home despite increasing doses of short acting insulin .  In the ED patient was noted to have acute kidney injury as well as dehydration.  He was given 2 L of fluids and antiemetics and insulin  and his sugar came down from 506-248.  Initial creatinine was 3 up from a baseline of approximately 2 but did improve to 2.7 with IV fluids.  CT of abdomen was negative.  Review of Systems: As mentioned in the history of present illness. All other systems reviewed and are negative. 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   Past Surgical History:  Procedure Laterality Date   AMPUTATION Left 11/07/2020   Procedure: 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;  Surgeon: Neill Boas, DPM;  Location: [facility];  Service: Podiatry;  Laterality: Left;   AMPUTATION Left 11/16/2020   Procedure: AMPUTATION BELOW KNEE;  Surgeon: Marea Selinda RAMAN, MD;  Location: [facility];  Service:  Vascular;  Laterality: Left;   AMPUTATION Left 01/29/2024   Procedure: AMPUTATION, ABOVE KNEE;  Surgeon: Marea Selinda RAMAN, MD;  Location: [facility];  Service: General;  Laterality: Left;   ANTERIOR CERVICAL DECOMP/DISCECTOMY FUSION N/A 09/09/2017   Procedure: ANTERIOR CERVICAL DECOMPRESSION/DISCECTOMY FUSION CERVICAL 6- CERVICAL 7;  Surgeon: Gillie Duncans, MD;  Location: [facility];  Service: Neurosurgery;  Laterality: N/A;  ANTERIOR CERVICAL DECOMPRESSION/DISCECTOMY FUSION CERVICAL 6- CERVICAL 7   APPENDECTOMY     I & D EXTREMITY Right 10/03/2017   Procedure: IRRIGATION AND DEBRIDEMENT RIGHT WRIST;  Surgeon: Murrell Drivers, MD;  Location: [facility];  Service: Orthopedics;  Laterality: Right;   I & D EXTREMITY Right 11/26/2018   Procedure: IRRIGATION AND DEBRIDEMENT FASCIA ON RIGHT FOOT;  Surgeon: Ashley Soulier, DPM;  Location: [facility];  Service: Podiatry;  Laterality: Right;   INCISION AND DRAINAGE Right 03/06/2019   Procedure: INCISION AND DRAINAGE RIGHT FOOT, WITH 4th RAY AMPUTATION;  Surgeon: Ashley Soulier, DPM;  Location: [facility];  Service: Podiatry;  Laterality: Right;   INCISION AND DRAINAGE Left 11/07/2020   Procedure: INCISION AND DRAINAGE;  Surgeon: Neill Boas, DPM;  Location: [facility];  Service: Podiatry;  Laterality: Left;   INCISION AND DRAINAGE ABSCESS Left 05/02/2023   Procedure: INCISION AND DRAINAGE ABSCESS LEFT LOWER EXTREMITY;  Surgeon: Marea Selinda RAMAN, MD;  Location: [facility];  Service: Vascular;  Laterality: Left;   IRRIGATION AND DEBRIDEMENT FOOT Left 11/11/2020   Procedure: IRRIGATION AND DEBRIDEMENT FOOT;  Surgeon: Neill Boas, DPM;  Location: [facility];  Service: Podiatry;  Laterality: Left;   METATARSAL HEAD EXCISION Right 05/15/2019   Procedure: OSTECTOMY;MET HEAD 5;  Surgeon: Ashley Soulier, DPM;  Location: [facility];  Service: Podiatry;  Laterality: Right;   osteomylitis     ROTATOR CUFF REPAIR Left    SHOULDER ARTHROSCOPY WITH DEBRIDEMENT AND BICEP TENDON REPAIR Right 08/27/2023    Procedure: SHOULDER ARTHROSCOPY WITH INCISION AND DRAINAGE;  Surgeon: Tobie Priest, MD;  Location: [facility];  Service: Orthopedics;  Laterality: Right;   Social History:  reports that he quit smoking about 18 months ago. His smoking use included cigarettes. He started smoking about 22 years ago. He has a 17 pack-year smoking history. He has never used smokeless tobacco. He reports that he does not currently use alcohol. He reports that he does not use drugs.  Allergies  Allergen Reactions   Oxycodone  Itching    Family History  Problem Relation Age of Onset   Diabetes Mother    Heart disease Father    Diabetes Father    Arthritis Other    Hyperlipidemia Other    Hypertension Other    Cancer Other        breast   Mental illness Neg Hx     Prior to Admission medications   Medication Sig Start Date End Date Taking? Authorizing Provider  acetaminophen  (TYLENOL ) 500 MG tablet Take 1 tablet (500 mg total) by mouth 3 (three) times daily. 02/05/24   Alexander, Natalie, DO  amLODipine  (NORVASC ) 10 MG tablet Take 1 tablet (10 mg total) by mouth every evening. 02/05/24   Alexander, Natalie, DO  ascorbic acid  (VITAMIN C ) 500 MG tablet Take 1 tablet (500 mg total) by mouth daily. 02/06/24   Alexander, Natalie, DO  benazepril  (LOTENSIN ) 40 MG tablet Take 40 mg by mouth daily. 02/09/24   [provider]  buPROPion  (WELLBUTRIN  XL) 150 MG 24 hr tablet Take 150 mg by mouth daily. 02/15/24   [provider]  cephALEXin  (KEFLEX ) 500 MG capsule Take 500 mg by mouth 3 (three) times daily. 03/12/24   [provider]  Continuous Glucose Sensor (DEXCOM G7 SENSOR) MISC Inject 1 Application into the skin as directed. Change sensor every 10 days as directed. 04/27/24   Therisa Benton PARAS, NP  ELIQUIS  5 MG TABS tablet Take 5 mg by mouth 2 (two) times daily. 03/12/24   [provider]  enoxaparin  (LOVENOX ) 40 MG/0.4ML injection Inject 0.4 mLs (40 mg total) into the skin every evening.  02/05/24   Alexander, Natalie, DO  fenofibrate  54 MG tablet Take 1 tablet (54 mg total) by mouth daily. 02/06/24   Alexander, Natalie, DO  HYDROmorphone  (DILAUDID ) 2 MG tablet Take 0.5-1 tablets (1-2 mg total) by mouth every 4 (four) hours as needed for severe pain (pain score 7-10). 02/05/24   Alexander, Natalie, DO  insulin  aspart (NOVOLOG ) 100 UNIT/ML injection Inject 10-16 Units into the skin 3 (three) times daily with meals. 90-150= 10 units; 151-200= 11 units; 201-250= 12 units; 251-300= 13 units; 301-350= 14 units; 351-400= 15 units; 401 or higher = 16 units 03/11/24   Therisa Benton PARAS, NP  insulin  glargine (LANTUS ) 100 UNIT/ML injection Inject 0.5 mLs (50 Units total) into the skin at bedtime. 03/11/24   Therisa Benton PARAS, NP  INSULIN  SYRINGE .5CC/29G 29G X 1/2 0.5 ML MISC Use to inject insulin  4 times daily 03/23/24   Therisa Benton PARAS, NP  iron  polysaccharides (NIFEREX) 150 MG capsule Take 1 capsule (150 mg total) by mouth  daily. 08/27/23   Laurita Pillion, MD  methocarbamol  (ROBAXIN ) 500 MG tablet Take 1 tablet (500 mg total) by mouth every 8 (eight) hours as needed for muscle spasms. 02/05/24   Alexander, Natalie, DO  omeprazole (PRILOSEC) 20 MG capsule Take 20 mg by mouth daily.    [provider]  pregabalin  (LYRICA ) 50 MG capsule Take 1 capsule (50 mg total) by mouth 3 (three) times daily. Patient taking differently: Take 75 mg by mouth 3 (three) times daily. 02/05/24   Alexander, Natalie, DO  sodium bicarbonate  650 MG tablet Take 1 tablet (650 mg total) by mouth 2 (two) times daily. 02/05/24   Alexander, Natalie, DO  traZODone  (DESYREL ) 100 MG tablet Take 100 mg by mouth at bedtime. 08/31/23   [provider]  Vitamin D , Ergocalciferol , (DRISDOL ) 1.25 MG (50000 UNIT) CAPS capsule Take 1 capsule (50,000 Units total) by mouth every 7 (seven) days. 02/11/24   Marsa Edelman, DO    Physical Exam: Vitals:   05/23/24 1134 05/23/24 1359  BP: (!) 145/106 (!) 151/101  Pulse: 97  86  Resp: 20 20  Temp: 98 F (36.7 C)   TempSrc: Oral   SpO2: 100% 100%  Weight: 97.5 kg   Height: 5' 10 (1.778 m)    Physical Examination: General appearance - alert, well appearing, and in no distress Chest - clear to auscultation, no wheezes, rales or rhonchi, symmetric air entry Heart - normal rate, regular rhythm, normal S1, S2, no murmurs, rubs, clicks or gallops Abdomen - soft, nontender, nondistended, no masses or organomegaly Extremities -well-healed AKA on left  Data Reviewed: Results for orders placed or performed during the hospital encounter of 05/23/24 (from the past 24 hours)  CBG monitoring, ED     Status: Abnormal   Collection Time: 05/23/24 11:35 AM  Result Value Ref Range   Glucose-Capillary 464 (H) 70 - 99 mg/dL  Lipase, blood     Status: Abnormal   Collection Time: 05/23/24 11:50 AM  Result Value Ref Range   Lipase 59 (H) 11 - 51 U/L  Comprehensive metabolic panel     Status: Abnormal   Collection Time: 05/23/24 11:50 AM  Result Value Ref Range   Sodium 124 (L) 135 - 145 mmol/L   Potassium 5.1 3.5 - 5.1 mmol/L   Chloride 95 (L) 98 - 111 mmol/L   CO2 21 (L) 22 - 32 mmol/L   Glucose, Bld 506 (HH) 70 - 99 mg/dL   BUN 54 (H) 6 - 20 mg/dL   Creatinine, Ser 6.99 (H) 0.61 - 1.24 mg/dL   Calcium  8.7 (L) 8.9 - 10.3 mg/dL   Total Protein 7.1 6.5 - 8.1 g/dL   Albumin 3.7 3.5 - 5.0 g/dL   AST 20 15 - 41 U/L   ALT 10 0 - 44 U/L   Alkaline Phosphatase 91 38 - 126 U/L   Total Bilirubin 1.4 (H) 0.0 - 1.2 mg/dL   GFR, Estimated 25 (L) >60 mL/min   Anion gap 8 5 - 15  CBC     Status: Abnormal   Collection Time: 05/23/24 11:50 AM  Result Value Ref Range   WBC 9.7 4.0 - 10.5 K/uL   RBC 5.26 4.22 - 5.81 MIL/uL   Hemoglobin 13.0 13.0 - 17.0 g/dL   HCT 61.4 (L) 60.9 - 47.9 %   MCV 73.2 (L) 80.0 - 100.0 fL   MCH 24.7 (L) 26.0 - 34.0 pg   MCHC 33.8 30.0 - 36.0 g/dL   RDW 86.0 88.4 -  15.5 %   Platelets 298 150 - 400 K/uL   nRBC 0.0 0.0 - 0.2 %  Beta-hydroxybutyric  acid     Status: Abnormal   Collection Time: 05/23/24 11:50 AM  Result Value Ref Range   Beta-Hydroxybutyric Acid 0.39 (H) 0.05 - 0.27 mmol/L  Urinalysis, Routine w reflex microscopic -Urine, Clean Catch     Status: Abnormal   Collection Time: 05/23/24 11:58 AM  Result Value Ref Range   Color, Urine YELLOW (A) YELLOW   APPearance CLEAR (A) CLEAR   Specific Gravity, Urine 1.020 1.005 - 1.030   pH 5.0 5.0 - 8.0   Glucose, UA >=500 (A) NEGATIVE mg/dL   Hgb urine dipstick SMALL (A) NEGATIVE   Bilirubin Urine NEGATIVE NEGATIVE   Ketones, ur NEGATIVE NEGATIVE mg/dL   Protein, ur >=699 (A) NEGATIVE mg/dL   Nitrite NEGATIVE NEGATIVE   Leukocytes,Ua NEGATIVE NEGATIVE   RBC / HPF 0-5 0 - 5 RBC/hpf   WBC, UA 0-5 0 - 5 WBC/hpf   Bacteria, UA NONE SEEN NONE SEEN   Squamous Epithelial / HPF 0-5 0 - 5 /HPF  CBG monitoring, ED     Status: Abnormal   Collection Time: 05/23/24 12:38 PM  Result Value Ref Range   Glucose-Capillary 523 (HH) 70 - 99 mg/dL   Comment 1 Notify RN   CBG monitoring, ED     Status: Abnormal   Collection Time: 05/23/24  1:45 PM  Result Value Ref Range   Glucose-Capillary 300 (H) 70 - 99 mg/dL  Basic metabolic panel     Status: Abnormal   Collection Time: 05/23/24  2:45 PM  Result Value Ref Range   Sodium 129 (L) 135 - 145 mmol/L   Potassium 3.6 3.5 - 5.1 mmol/L   Chloride 104 98 - 111 mmol/L   CO2 21 (L) 22 - 32 mmol/L   Glucose, Bld 248 (H) 70 - 99 mg/dL   BUN 51 (H) 6 - 20 mg/dL   Creatinine, Ser 7.28 (H) 0.61 - 1.24 mg/dL   Calcium  8.0 (L) 8.9 - 10.3 mg/dL   GFR, Estimated 28 (L) >60 mL/min   Anion gap 4 (L) 5 - 15   CT ABDOMEN PELVIS WO CONTRAST Result Date: 05/23/2024 CLINICAL DATA:  Continuous nausea and vomiting. Epigastric pain. No recent bowel movements. EXAM: CT ABDOMEN AND PELVIS WITHOUT CONTRAST TECHNIQUE: Multidetector CT imaging of the abdomen and pelvis was performed following the standard protocol without IV contrast. RADIATION DOSE REDUCTION: This  exam was performed according to the departmental dose-optimization program which includes automated exposure control, adjustment of the mA and/or kV according to patient size and/or use of iterative reconstruction technique. COMPARISON:  None Available. FINDINGS: Lower chest: Lung bases are clear. Hepatobiliary: No focal hepatic lesion. Normal gallbladder. No biliary duct dilatation. Common bile duct is normal. Pancreas: Pancreas is normal. No ductal dilatation. No pancreatic inflammation. Spleen: Normal spleen Adrenals/urinary tract: Adrenal glands and kidneys are normal. The ureters and bladder normal. Stomach/Bowel: Stomach, small-bowel and cecum are normal. The appendix is not identified but there is no pericecal inflammation to suggest appendicitis. The colon and rectosigmoid colon are normal. Normal volume stool. No evidence obstruction. Vascular/Lymphatic: Abdominal aorta is normal caliber. No periportal or retroperitoneal adenopathy. No pelvic adenopathy. Reproductive: Unremarkable Other: No free fluid. Musculoskeletal: No aggressive osseous lesion. IMPRESSION: 1. No acute findings in the abdomen pelvis. 2. Normal bowel. Normal volume stool. 3. Normal gallbladder. Electronically Signed   By: Jackquline Boxer M.D.   On: 05/23/2024 13:34  Assessment and Plan: Essential hypertension Continue amlodipine  Continue BenzePrO  HLD (hyperlipidemia) Continue fenofibrate   Dehydration Secondary to nausea vomiting and possibly hyperglycemia Improved glycemic control Antiemetics He reports no history of gastroparesis but that this is common due to his chronic constipation related to chronic opioid use.  Hyponatremia Initially as low as 124 Improved to 129 with IV hydration Continue IV fluids Recheck sodium in the morning  PAD (peripheral artery disease) (HCC) Continue fenofibrate  Status post AKA of left knee awaiting prosthetic  Acute kidney injury superimposed on chronic kidney disease  (HCC) Likely secondary to dehydration on top of chronic kidney disease related to HTN and DM Avoid nephrotoxic agents Trend IV fluid resuscitation will likely continue to help this.  Constipation Likely secondary to chronic opioids MiraLAX  Dulcolax Senokot  Gastroesophageal reflux disease PPI  Intractable nausea and vomiting Questionably related to constipation Antiemetics IV fluid resuscitation If fails to improve consider right upper quadrant ultrasound      Advance Care Planning:   Code Status: Prior full  Consults: None  Family Communication: Patient's mother at bedside  Severity of Illness: The appropriate patient status for this patient is OBSERVATION. Observation status is judged to be reasonable and necessary in order to provide the required intensity of service to ensure the patient's safety. The patient's presenting symptoms, physical exam findings, and initial radiographic and laboratory data in the context of their medical condition is felt to place them at decreased risk for further clinical deterioration. Furthermore, it is anticipated that the patient will be medically stable for discharge from the hospital within 2 midnights of admission.   Author: Glenys GORMAN Birk, MD 05/23/2024 4:05 PM  For on call review [email or web address].

## 2024-05-23 NOTE — Assessment & Plan Note (Signed)
 Likely secondary to chronic opioids MiraLAX  Dulcolax Senokot

## 2024-05-23 NOTE — Assessment & Plan Note (Signed)
 Questionably related to constipation Antiemetics IV fluid resuscitation If fails to improve consider right upper quadrant ultrasound

## 2024-05-23 NOTE — Assessment & Plan Note (Signed)
 Secondary to nausea vomiting and possibly hyperglycemia Improved glycemic control Antiemetics He reports no history of gastroparesis but that this is common due to his chronic constipation related to chronic opioid use.

## 2024-05-23 NOTE — ED Triage Notes (Signed)
 To ED from home for N/V since 4 days and constipation since 8 days ago--LBM was 8 days ago. Also glucometer at home has been HI and has been taking extra insulin . Also upper burning abdominal pain since 4 days when N/V started. Has been taking stool softeners. Pt is AKA to L. Is type 2 DM. Hx DKA a long time ago.  CBG is 464.

## 2024-05-23 NOTE — Assessment & Plan Note (Signed)
 Initially as low as 124 Improved to 129 with IV hydration Continue IV fluids Recheck sodium in the morning

## 2024-05-23 NOTE — ED Notes (Signed)
 Pt in CT.

## 2024-05-23 NOTE — Assessment & Plan Note (Signed)
 Continue fenofibrate

## 2024-05-23 NOTE — ED Provider Notes (Signed)
 [facility] Provider Note    Event Date/Time   First MD Initiated Contact with Patient 05/23/24 1153     (approximate)   History   Abdominal Pain, Emesis, and Constipation   HPI Richard Mendoza is a 48 y.o. male with history of DM2, AKA, HTN, HLD, on Eliquis , CKD stage III presenting today for abdominal pain.  Patient states for the past 4 days he has had nausea and vomiting as well as 8 days of constipation.  He has been checking his glucose at home which is consistently reading high even when he takes extra insulin .  Mostly notes pain in the upper abdomen since onset of the nausea and vomiting.  Otherwise denies fever, congestion, cough, shortness of breath, chest pain, body aches, dysuria.  No prior abdominal surgeries.  Has been taking stool softeners for the past several days without relief.     Physical Exam   Triage Vital Signs: ED Triage Vitals  Encounter Vitals Group     BP 05/23/24 1134 (!) 145/106     Girls Systolic BP Percentile --      Girls Diastolic BP Percentile --      Boys Systolic BP Percentile --      Boys Diastolic BP Percentile --      Pulse Rate 05/23/24 1134 97     Resp 05/23/24 1134 20     Temp 05/23/24 1134 98 F (36.7 C)     Temp Source 05/23/24 1134 Oral     SpO2 05/23/24 1134 100 %     Weight 05/23/24 1134 215 lb (97.5 kg)     Height 05/23/24 1134 5' 10 (1.778 m)     Head Circumference --      Peak Flow --      Pain Score 05/23/24 1135 10     Pain Loc --      Pain Education --      Exclude from Growth Chart --     Most recent vital signs: Vitals:   05/23/24 1359 05/23/24 1610  BP: (!) 151/101   Pulse: 86   Resp: 20   Temp:  98 F (36.7 C)  SpO2: 100%    Physical Exam: I have reviewed the vital signs and nursing notes. General: Awake, alert, no acute distress.  Nontoxic appearing. Head:  Atraumatic, normocephalic.   ENT:  EOM intact, PERRL. Oral mucosa is pink and moist with no lesions. Neck: Neck is  supple with full range of motion, No meningeal signs. Cardiovascular:  RRR, No murmurs. Peripheral pulses palpable and equal bilaterally. Respiratory:  Symmetrical chest wall expansion.  No rhonchi, rales, or wheezes.  Good air movement throughout.  No use of accessory muscles.   Musculoskeletal:  No cyanosis or edema. Moving extremities with full ROM Abdomen:  Soft, mild TTP in epigastric region, nondistended. Neuro:  GCS 15, moving all four extremities, interacting appropriately. Speech clear. Psych:  Calm, appropriate.   Skin:  Warm, dry, no rash.    ED Results / Procedures / Treatments   Labs (all labs ordered are listed, but only abnormal results are displayed) Labs Reviewed  LIPASE, BLOOD - Abnormal; Notable for the following components:      Result Value   Lipase 59 (*)    All other components within normal limits  COMPREHENSIVE METABOLIC PANEL WITH GFR - Abnormal; Notable for the following components:   Sodium 124 (*)    Chloride 95 (*)    CO2 21 (*)  Glucose, Bld 506 (*)    BUN 54 (*)    Creatinine, Ser 3.00 (*)    Calcium  8.7 (*)    Total Bilirubin 1.4 (*)    GFR, Estimated 25 (*)    All other components within normal limits  CBC - Abnormal; Notable for the following components:   HCT 38.5 (*)    MCV 73.2 (*)    MCH 24.7 (*)    All other components within normal limits  URINALYSIS, ROUTINE W REFLEX MICROSCOPIC - Abnormal; Notable for the following components:   Color, Urine YELLOW (*)    APPearance CLEAR (*)    Glucose, UA >=500 (*)    Hgb urine dipstick SMALL (*)    Protein, ur >=300 (*)    All other components within normal limits  BETA-HYDROXYBUTYRIC ACID - Abnormal; Notable for the following components:   Beta-Hydroxybutyric Acid 0.39 (*)    All other components within normal limits  BASIC METABOLIC PANEL WITH GFR - Abnormal; Notable for the following components:   Sodium 129 (*)    CO2 21 (*)    Glucose, Bld 248 (*)    BUN 51 (*)    Creatinine, Ser  2.71 (*)    Calcium  8.0 (*)    GFR, Estimated 28 (*)    Anion gap 4 (*)    All other components within normal limits  CBG MONITORING, ED - Abnormal; Notable for the following components:   Glucose-Capillary 464 (*)    All other components within normal limits  CBG MONITORING, ED - Abnormal; Notable for the following components:   Glucose-Capillary 523 (*)    All other components within normal limits  CBG MONITORING, ED - Abnormal; Notable for the following components:   Glucose-Capillary 300 (*)    All other components within normal limits     EKG My EKG interpretation: Rate of 97, normal sinus rhythm with occasional PVCs.  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  No other acute ST elevations or depressions.     RADIOLOGY Independently interpreted CT abdomen/pelvis with no acute findings   PROCEDURES:  Critical Care performed: No  Procedures   MEDICATIONS ORDERED IN ED: Medications  fentaNYL  (SUBLIMAZE ) injection 50 mcg (has no administration in time range)  sodium chloride  0.9 % bolus 1,000 mL (0 mLs Intravenous Stopped 05/23/24 1354)  ondansetron  (ZOFRAN ) injection 4 mg (4 mg Intravenous Given 05/23/24 1210)  morphine  (PF) 4 MG/ML injection 4 mg (4 mg Intravenous Given 05/23/24 1210)  insulin  aspart (novoLOG ) injection 15 Units (15 Units Intravenous Given 05/23/24 1241)  sodium chloride  0.9 % bolus 1,000 mL (0 mLs Intravenous Stopped 05/23/24 1446)  prochlorperazine  (COMPAZINE ) injection 5 mg (5 mg Intravenous Given 05/23/24 1358)  alum & mag hydroxide-simeth (MAALOX/MYLANTA) 200-200-20 MG/5ML suspension 30 mL (30 mLs Oral Given 05/23/24 1603)  lidocaine  (XYLOCAINE ) 2 % viscous mouth solution 15 mL (15 mLs Mouth/Throat Given 05/23/24 1603)     IMPRESSION / MDM / ASSESSMENT AND PLAN / ED COURSE  I reviewed the triage vital signs and the nursing notes.                              Differential diagnosis includes, but is not limited to, DKA, symptomatic  hyperglycemia, pancreatitis, gastritis, SBO, constipation  Patient's presentation is most consistent with acute complicated illness / injury requiring diagnostic workup.  Patient is a 48 year old male presenting today for upper abdominal pain associated with nausea,  vomiting, diarrhea, and constipation.  Mild tenderness in the epigastric region but otherwise no tenderness elsewhere in the abdomen.  Vital signs are stable.  Does have notable hyperglycemia on arrival at 464.  Patient initially given 1 L fluids, Zofran , and morphine .  Laboratory workup as well as CT abdomen/pelvis ordered for further evaluation.  Laboratory workup shows bicarb of 21 but no anion gap acidosis.  Beta hydroxybutyrate level only at 0.39 and with no ketones in his urine, low suspicion for DKA.  Lipase slightly elevated at 59 although this may be secondary to vomiting less likely acute pancreatitis.  CT imaging of the abdomen/pelvis shows no acute findings.  Patient improvement in blood sugar to 300 and was given an additional 1 L fluids, and Compazine  given ongoing symptoms.  Recheck of his BMP afterwards showed ongoing AKI at 2.71 which is elevated from his baseline.  Patient was still symptomatic and given ongoing symptoms with dehydration and AKI, will admit to hospitalist for further care.  The patient is on the cardiac monitor to evaluate for evidence of arrhythmia and/or significant heart rate changes. Clinical Course as of 05/23/24 1621  Sat May 23, 2024  1231 Creatinine(!): 3.00 1.0 increase from baseline at 2 [DW]  1314 Sodium(!): 124 Sodium correction for hyperglycemia is 134 (hillier, 1999) [DW]  1411 Urinalysis, Routine w reflex microscopic -Urine, Clean Catch(!) No ketones in urine and along with very low beta hydroxybutyrate level at 0.39, very low suspicion for DKA [DW]  1552 Still having ongoing symptoms, given multiple fluid boluses and nausea medication, will admit to hospitalist for AKI and symptomatic  hyperglycemia [DW]    Clinical Course User Index [DW] Malvina Alm DASEN, MD     FINAL CLINICAL IMPRESSION(S) / ED DIAGNOSES   Final diagnoses:  AKI (acute kidney injury) (HCC)  Dehydration  Hyperglycemia     Rx / DC Orders   ED Discharge Orders     None        Note:  This document was prepared using Dragon voice recognition software and may include unintentional dictation errors.   Malvina Alm DASEN, MD 05/23/24 [phone]

## 2024-05-23 NOTE — Assessment & Plan Note (Deleted)
 Likely secondary to dehydration on top of chronic kidney disease related to HTN and DM Avoid nephrotoxic agents Trend IV fluid resuscitation will likely continue to help this.

## 2024-05-24 DIAGNOSIS — E860 Dehydration: Secondary | ICD-10-CM | POA: Diagnosis present

## 2024-05-24 DIAGNOSIS — E1122 Type 2 diabetes mellitus with diabetic chronic kidney disease: Secondary | ICD-10-CM | POA: Diagnosis present

## 2024-05-24 DIAGNOSIS — E1142 Type 2 diabetes mellitus with diabetic polyneuropathy: Secondary | ICD-10-CM | POA: Diagnosis present

## 2024-05-24 DIAGNOSIS — I129 Hypertensive chronic kidney disease with stage 1 through stage 4 chronic kidney disease, or unspecified chronic kidney disease: Secondary | ICD-10-CM | POA: Diagnosis present

## 2024-05-24 DIAGNOSIS — Z833 Family history of diabetes mellitus: Secondary | ICD-10-CM | POA: Diagnosis not present

## 2024-05-24 DIAGNOSIS — Z8249 Family history of ischemic heart disease and other diseases of the circulatory system: Secondary | ICD-10-CM | POA: Diagnosis not present

## 2024-05-24 DIAGNOSIS — R112 Nausea with vomiting, unspecified: Secondary | ICD-10-CM | POA: Diagnosis present

## 2024-05-24 DIAGNOSIS — Z79899 Other long term (current) drug therapy: Secondary | ICD-10-CM | POA: Diagnosis not present

## 2024-05-24 DIAGNOSIS — E871 Hypo-osmolality and hyponatremia: Secondary | ICD-10-CM | POA: Diagnosis present

## 2024-05-24 DIAGNOSIS — T402X5A Adverse effect of other opioids, initial encounter: Secondary | ICD-10-CM | POA: Diagnosis present

## 2024-05-24 DIAGNOSIS — Z885 Allergy status to narcotic agent status: Secondary | ICD-10-CM | POA: Diagnosis not present

## 2024-05-24 DIAGNOSIS — R197 Diarrhea, unspecified: Secondary | ICD-10-CM | POA: Diagnosis not present

## 2024-05-24 DIAGNOSIS — E1151 Type 2 diabetes mellitus with diabetic peripheral angiopathy without gangrene: Secondary | ICD-10-CM | POA: Diagnosis present

## 2024-05-24 DIAGNOSIS — E785 Hyperlipidemia, unspecified: Secondary | ICD-10-CM | POA: Diagnosis present

## 2024-05-24 DIAGNOSIS — N1831 Chronic kidney disease, stage 3a: Secondary | ICD-10-CM | POA: Diagnosis present

## 2024-05-24 DIAGNOSIS — E1165 Type 2 diabetes mellitus with hyperglycemia: Secondary | ICD-10-CM | POA: Diagnosis not present

## 2024-05-24 DIAGNOSIS — Z87891 Personal history of nicotine dependence: Secondary | ICD-10-CM | POA: Diagnosis not present

## 2024-05-24 DIAGNOSIS — N179 Acute kidney failure, unspecified: Secondary | ICD-10-CM | POA: Diagnosis not present

## 2024-05-24 DIAGNOSIS — K5903 Drug induced constipation: Secondary | ICD-10-CM | POA: Diagnosis present

## 2024-05-24 DIAGNOSIS — K219 Gastro-esophageal reflux disease without esophagitis: Secondary | ICD-10-CM | POA: Diagnosis present

## 2024-05-24 DIAGNOSIS — Z794 Long term (current) use of insulin: Secondary | ICD-10-CM | POA: Diagnosis not present

## 2024-05-24 DIAGNOSIS — Z7901 Long term (current) use of anticoagulants: Secondary | ICD-10-CM | POA: Diagnosis not present

## 2024-05-24 DIAGNOSIS — Z89512 Acquired absence of left leg below knee: Secondary | ICD-10-CM | POA: Diagnosis not present

## 2024-05-24 DIAGNOSIS — E861 Hypovolemia: Secondary | ICD-10-CM | POA: Diagnosis present

## 2024-05-24 LAB — BASIC METABOLIC PANEL WITH GFR
Anion gap: 7 (ref 5–15)
BUN: 42 mg/dL — ABNORMAL HIGH (ref 6–20)
CO2: 19 mmol/L — ABNORMAL LOW (ref 22–32)
Calcium: 8.6 mg/dL — ABNORMAL LOW (ref 8.9–10.3)
Chloride: 105 mmol/L (ref 98–111)
Creatinine, Ser: 2.25 mg/dL — ABNORMAL HIGH (ref 0.61–1.24)
GFR, Estimated: 35 mL/min — ABNORMAL LOW (ref >60)
Glucose, Bld: 181 mg/dL — ABNORMAL HIGH (ref 70–99)
Potassium: 3.8 mmol/L (ref 3.5–5.1)
Sodium: 131 mmol/L — ABNORMAL LOW (ref 135–145)

## 2024-05-24 LAB — CBC
HCT: 34.4 % — ABNORMAL LOW (ref 39.0–52.0)
Hemoglobin: 11.7 g/dL — ABNORMAL LOW (ref 13.0–17.0)
MCH: 24.7 pg — ABNORMAL LOW (ref 26.0–34.0)
MCHC: 34 g/dL (ref 30.0–36.0)
MCV: 72.6 fL — ABNORMAL LOW (ref 80.0–100.0)
Platelets: 238 K/uL (ref 150–400)
RBC: 4.74 MIL/uL (ref 4.22–5.81)
RDW: 14 % (ref 11.5–15.5)
WBC: 7.8 K/uL (ref 4.0–10.5)
nRBC: 0 % (ref 0.0–0.2)

## 2024-05-24 LAB — GLUCOSE, CAPILLARY
Glucose-Capillary: 279 mg/dL — ABNORMAL HIGH (ref 70–99)
Glucose-Capillary: 322 mg/dL — ABNORMAL HIGH (ref 70–99)
Glucose-Capillary: 326 mg/dL — ABNORMAL HIGH (ref 70–99)
Glucose-Capillary: 193 mg/dL — ABNORMAL HIGH (ref 70–99)

## 2024-05-24 NOTE — Progress Notes (Signed)
 Progress Note   Patient: Richard Mendoza FMW:[id number] DOB: 08-Feb-1976 DOA: 05/23/2024     0 DOS: the patient was seen and examined on 05/24/2024   Brief hospital course: Richard Mendoza is a 48 y.o. 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  He is taken multiple stool softeners and laxatives to no avail.  Over the last 4 days he has been unable to tolerate anything by mouth.  He reports significant nausea vomiting as well as epigastric pain.  Patient reports significant hyperglycemia at home despite increasing doses of short acting insulin .  In the ED patient was noted to have acute kidney injury as well as dehydration.  He was given 2 L of fluids and antiemetics and insulin  and his sugar came down from 506-248.  Initial creatinine was 3 up from a baseline of approximately 2 but did improve to 2.7 with IV fluids.  CT of abdomen was negative.  Review of Systems: As mentioned in the history of present illness. All other systems reviewed and are negative.  Assessment and Plan: Essential hypertension Continue amlodipine  Continue BenzePrO   HLD (hyperlipidemia) Continue fenofibrate    Dehydration Secondary to nausea vomiting and possibly hyperglycemia Improved glycemic control Continue antiemetics Continue glucose control    Hyponatremia Hyponatremia improving I continue IV fluid Recheck sodium in the morning   PAD (peripheral artery disease) (HCC) Continue fenofibrate  Status post AKA of left knee awaiting prosthetic   Acute kidney injury superimposed on chronic kidney disease (HCC) Likely secondary to dehydration on top of chronic kidney disease related to HTN and DM Avoid nephrotoxic agents Trend IV fluid resuscitation will likely continue to help this.   Constipation Likely secondary to chronic  opioids MiraLAX  Dulcolax Senokot   Gastroesophageal reflux disease PPI   Intractable nausea and vomiting Questionably related to constipation Antiemetics IV fluid resuscitation If fails to improve consider right upper quadrant ultrasound    Advance Care Planning:   Code Status: Prior full   Consults: None   Family Communication: Patient's mother at bedside    Subjective:  Patient seen and examined at bedside this morning Still having some nausea but vomiting improved, denies diarrhea has some epigastric pain Still requiring IV fluid therapy  Physical Exam:  General appearance - alert, well appearing, and in no distress Chest - clear to auscultation, no wheezes, rales or rhonchi, symmetric air entry Heart - normal rate, regular rhythm, normal S1, S2, no murmurs, rubs, clicks or gallops Abdomen - soft, nontender, nondistended, no masses or organomegaly Extremities -well-healed AKA on left  Vitals:   05/23/24 1837 05/23/24 1940 05/24/24 0402 05/24/24 0750  BP: (!) 173/96 (!) 153/91 (!) 140/87 (!) 145/84  Pulse: 82 75 63 77  Resp: 18 18 18 17   Temp:  98.6 F (37 C) 98.4 F (36.9 C) 97.8 F (36.6 C)  TempSrc:  Oral    SpO2: 99% 99% 99% 99%  Weight:      Height:        Data Reviewed: CT scan of the abdomen reviewed that did not show any acute intra-abdominal pathology    Latest Ref Rng & Units 05/24/2024    5:24 AM 05/23/2024   11:50 AM 02/05/2024    5:42 AM  CBC  WBC 4.0 - 10.5 K/uL 7.8  9.7  9.2   Hemoglobin 13.0 - 17.0 g/dL 88.2  13.0  8.1   Hematocrit 39.0 - 52.0 % 34.4  38.5  24.6   Platelets 150 - 400 K/uL 238  298  297        Latest Ref Rng & Units 05/24/2024    5:24 AM 05/23/2024    2:45 PM 05/23/2024   11:50 AM  BMP  Glucose 70 - 99 mg/dL 818  751  493   BUN 6 - 20 mg/dL 42  51  54   Creatinine 0.61 - 1.24 mg/dL 7.74  7.28  6.99   Sodium 135 - 145 mmol/L 131  129  124   Potassium 3.5 - 5.1 mmol/L 3.8  3.6  5.1   Chloride 98 - 111 mmol/L 105  104  95    CO2 22 - 32 mmol/L 19  21  21    Calcium  8.9 - 10.3 mg/dL 8.6  8.0  8.7       Time spent: 55 minutes  Author: Drue ONEIDA Potter, MD 05/24/2024 3:38 PM  For on call review [email or web address].

## 2024-05-24 NOTE — Plan of Care (Signed)

## 2024-05-25 DIAGNOSIS — E860 Dehydration: Secondary | ICD-10-CM | POA: Diagnosis not present

## 2024-05-25 LAB — CBC WITH DIFFERENTIAL/PLATELET
Abs Immature Granulocytes: 0.03 K/uL (ref 0.00–0.07)
Basophils Absolute: 0.1 K/uL (ref 0.0–0.1)
Basophils Relative: 1 %
Eosinophils Absolute: 0.2 K/uL (ref 0.0–0.5)
Eosinophils Relative: 2 %
HCT: 32.1 % — ABNORMAL LOW (ref 39.0–52.0)
Hemoglobin: 10.8 g/dL — ABNORMAL LOW (ref 13.0–17.0)
Immature Granulocytes: 0 %
Lymphocytes Relative: 17 %
Lymphs Abs: 1.4 K/uL (ref 0.7–4.0)
MCH: 25 pg — ABNORMAL LOW (ref 26.0–34.0)
MCHC: 33.6 g/dL (ref 30.0–36.0)
MCV: 74.3 fL — ABNORMAL LOW (ref 80.0–100.0)
Monocytes Absolute: 0.5 K/uL (ref 0.1–1.0)
Monocytes Relative: 7 %
Neutro Abs: 6.1 K/uL (ref 1.7–7.7)
Neutrophils Relative %: 73 %
Platelets: 204 K/uL (ref 150–400)
RBC: 4.32 MIL/uL (ref 4.22–5.81)
RDW: 14.5 % (ref 11.5–15.5)
WBC: 8.3 K/uL (ref 4.0–10.5)
nRBC: 0 % (ref 0.0–0.2)

## 2024-05-25 LAB — BASIC METABOLIC PANEL WITH GFR
Anion gap: 4 — ABNORMAL LOW (ref 5–15)
BUN: 40 mg/dL — ABNORMAL HIGH (ref 6–20)
CO2: 20 mmol/L — ABNORMAL LOW (ref 22–32)
Calcium: 8.7 mg/dL — ABNORMAL LOW (ref 8.9–10.3)
Chloride: 109 mmol/L (ref 98–111)
Creatinine, Ser: 2.11 mg/dL — ABNORMAL HIGH (ref 0.61–1.24)
GFR, Estimated: 38 mL/min — ABNORMAL LOW (ref >60)
Glucose, Bld: 305 mg/dL — ABNORMAL HIGH (ref 70–99)
Potassium: 4.2 mmol/L (ref 3.5–5.1)
Sodium: 133 mmol/L — ABNORMAL LOW (ref 135–145)

## 2024-05-25 LAB — HEMOGLOBIN A1C
Hgb A1c MFr Bld: 9.2 % — ABNORMAL HIGH (ref 4.8–5.6)
Mean Plasma Glucose: 217.34 mg/dL

## 2024-05-25 LAB — GLUCOSE, CAPILLARY
Glucose-Capillary: 295 mg/dL — ABNORMAL HIGH (ref 70–99)
Glucose-Capillary: 313 mg/dL — ABNORMAL HIGH (ref 70–99)
Glucose-Capillary: 306 mg/dL — ABNORMAL HIGH (ref 70–99)
Glucose-Capillary: 275 mg/dL — ABNORMAL HIGH (ref 70–99)

## 2024-05-25 MED ORDER — LACTULOSE 10 GM/15ML PO SOLN
30.0000 g | Freq: Once | ORAL | Status: AC
  Administered 2024-05-25: 30 g via ORAL
  Filled 2024-05-25: qty 60

## 2024-05-25 MED ORDER — GLUCERNA SHAKE PO LIQD
237.0000 mL | Freq: Three times a day (TID) | ORAL | Status: DC
  Administered 2024-05-25 – 2024-05-26 (×4): 237 mL via ORAL

## 2024-05-25 NOTE — Progress Notes (Signed)
 Patient has expressed concerns over his BS levels being high.  Nurse sent a secure chat message to the provider informing him of the last three blood sugar levels.  Awaiting a response from the provider.

## 2024-05-25 NOTE — Plan of Care (Signed)

## 2024-05-25 NOTE — Inpatient Diabetes Management (Addendum)
 Inpatient Diabetes Program Recommendations  AACE/ADA: New Consensus Statement on Inpatient Glycemic Control (2015)  Target Ranges:  Prepandial:   less than 140 mg/dL      Peak postprandial:   less than 180 mg/dL (1-2 hours)      Critically ill patients:  140 - 180 mg/dL   Lab Results  Component Value Date   GLUCAP 295 (H) 05/25/2024   HGBA1C 13.6 (H) 01/24/2024    Review of Glycemic Control  Latest Reference Range & Units 05/23/24 17:40 05/23/24 20:46 05/24/24 08:20 05/24/24 12:02 05/24/24 17:19 05/24/24 21:14 05/25/24 07:41  Glucose-Capillary 70 - 99 mg/dL 738 (H) 731 (H) 720 (H) 322 (H) 326 (H) 193 (H) 295 (H)   Diabetes history: DM 2 Outpatient Diabetes medications:  Dexcom G7 Novolog  10-16 units tid with meals Lantus  50 units q HS Actos 30 mg daily Current orders for Inpatient glycemic control:  Novolog  0-9 units tid with meals and HS Semglee  50 units daily  Inpatient Diabetes Program Recommendations:    May consider adding Novolog  6 units tid with meals (hold if patient eats less than 50% or NPO). Also please add A1C to labs.   Thanks,  Randall Bullocks, RN, BC-ADM Inpatient Diabetes Coordinator Pager [phone]  (8a-5p)

## 2024-05-25 NOTE — Plan of Care (Signed)
   Problem: Education: Goal: Knowledge of General Education information will improve Description: Including pain rating scale, medication(s)/side effects and non-pharmacologic comfort measures Outcome: Progressing   Problem: Clinical Measurements: Goal: Ability to maintain clinical measurements within normal limits will improve Outcome: Progressing Goal: Will remain free from infection Outcome: Progressing

## 2024-05-25 NOTE — Progress Notes (Signed)
 Progress Note   Patient: Richard Mendoza FMW:[id number] DOB: 04-11-1976 DOA: 05/23/2024     1 DOS: the patient was seen and examined on 05/25/2024      Brief hospital course: Richard Mendoza is a 48 y.o. 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  He is taken multiple stool softeners and laxatives to no avail.  Over the last 4 days he has been unable to tolerate anything by mouth.  He reports significant nausea vomiting as well as epigastric pain.  Patient reports significant hyperglycemia at home despite increasing doses of short acting insulin .  In the ED patient was noted to have acute kidney injury as well as dehydration.  He was given 2 L of fluids and antiemetics and insulin  and his sugar came down from 506-248.  Initial creatinine was 3 up from a baseline of approximately 2 but did improve to 2.7 with IV fluids.  CT of abdomen was negative.     Assessment and Plan:  Dehydration Secondary to nausea vomiting and possibly hyperglycemia Improved glycemic control Continue antiemetics Continue glucose control     Hyponatremia Hyponatremia improving Continue IV fluid Recheck sodium in the morning    Essential hypertension Continue amlodipine  Continue BenzePrO   HLD (hyperlipidemia) Continue fenofibrate    PAD (peripheral artery disease) (HCC) Continue fenofibrate  Status post AKA of left knee awaiting prosthetic   Acute kidney injury superimposed on chronic kidney disease (HCC) Likely secondary to dehydration on top of chronic kidney disease related to HTN and DM Avoid nephrotoxic agents Trend IV fluid resuscitation will likely continue to help this.   Constipation-improved Likely secondary to chronic opioids MiraLAX  Dulcolax Senokot   Gastroesophageal reflux disease PPI   Intractable  nausea and vomiting Questionably related to constipation Antiemetics IV fluid resuscitation If fails to improve consider right upper quadrant ultrasound      Advance Care Planning:   Code Status: Prior full   Consults: None   Family Communication: Patient's mother at bedside     Subjective:  Patient had an episode of diarrhea this morning He is worried he will be dehydrated and when he goes home We will continue to monitor closely Denies nausea vomiting worsening abdominal pain Laxatives are being withheld and nursing staff informed   Physical Exam:   General appearance - alert, well appearing, and in no distress Chest - clear to auscultation, no wheezes, rales or rhonchi, symmetric air entry Heart - normal rate, regular rhythm, normal S1, S2, no murmurs, rubs, clicks or gallops Abdomen - soft, nontender, nondistended, no masses or organomegaly Extremities -well-healed AKA on left    Data Reviewed:    Latest Ref Rng & Units 05/25/2024    3:23 AM 05/24/2024    5:24 AM 05/23/2024   11:50 AM  CBC  WBC 4.0 - 10.5 K/uL 8.3  7.8  9.7   Hemoglobin 13.0 - 17.0 g/dL 89.1  88.2  86.9   Hematocrit 39.0 - 52.0 % 32.1  34.4  38.5   Platelets 150 - 400 K/uL 204  238  298        Latest Ref Rng & Units 05/25/2024    3:23 AM 05/24/2024    5:24 AM 05/23/2024    2:45 PM  BMP  Glucose 70 - 99 mg/dL 694  818  751   BUN 6 - 20 mg/dL 40  42  51   Creatinine 0.61 - 1.24 mg/dL 7.88  7.74  7.28   Sodium 135 - 145 mmol/L 133  131  129   Potassium 3.5 - 5.1 mmol/L 4.2  3.8  3.6   Chloride 98 - 111 mmol/L 109  105  104   CO2 22 - 32 mmol/L 20  19  21    Calcium  8.9 - 10.3 mg/dL 8.7  8.6  8.0      Vitals:   05/25/24 0744 05/25/24 0800 05/25/24 1124 05/25/24 1547  BP: (!) 161/90 (!) 150/82 124/87 (!) 150/91  Pulse: 75 79 79 93  Resp: 15 16  16   Temp: 98.3 F (36.8 C) 98.3 F (36.8 C)  98.2 F (36.8 C)  TempSrc:  Oral  Oral  SpO2: 100% 100%  100%  Weight:      Height:          Author: Drue ONEIDA Potter, MD 05/25/2024 4:28 PM  For on call review [email or web address].

## 2024-05-26 DIAGNOSIS — E860 Dehydration: Secondary | ICD-10-CM | POA: Diagnosis not present

## 2024-05-26 LAB — CBC WITH DIFFERENTIAL/PLATELET
Abs Immature Granulocytes: 0.05 K/uL (ref 0.00–0.07)
Basophils Absolute: 0.1 K/uL (ref 0.0–0.1)
Basophils Relative: 1 %
Eosinophils Absolute: 0.3 K/uL (ref 0.0–0.5)
Eosinophils Relative: 3 %
HCT: 32.8 % — ABNORMAL LOW (ref 39.0–52.0)
Hemoglobin: 10.6 g/dL — ABNORMAL LOW (ref 13.0–17.0)
Immature Granulocytes: 1 %
Lymphocytes Relative: 14 %
Lymphs Abs: 1.3 K/uL (ref 0.7–4.0)
MCH: 24.4 pg — ABNORMAL LOW (ref 26.0–34.0)
MCHC: 32.3 g/dL (ref 30.0–36.0)
MCV: 75.6 fL — ABNORMAL LOW (ref 80.0–100.0)
Monocytes Absolute: 0.6 K/uL (ref 0.1–1.0)
Monocytes Relative: 6 %
Neutro Abs: 6.6 K/uL (ref 1.7–7.7)
Neutrophils Relative %: 75 %
Platelets: 209 K/uL (ref 150–400)
RBC: 4.34 MIL/uL (ref 4.22–5.81)
RDW: 14.8 % (ref 11.5–15.5)
WBC: 8.8 K/uL (ref 4.0–10.5)
nRBC: 0 % (ref 0.0–0.2)

## 2024-05-26 LAB — BASIC METABOLIC PANEL WITH GFR
Anion gap: 8 (ref 5–15)
BUN: 42 mg/dL — ABNORMAL HIGH (ref 6–20)
CO2: 20 mmol/L — ABNORMAL LOW (ref 22–32)
Calcium: 8.8 mg/dL — ABNORMAL LOW (ref 8.9–10.3)
Chloride: 105 mmol/L (ref 98–111)
Creatinine, Ser: 2.18 mg/dL — ABNORMAL HIGH (ref 0.61–1.24)
GFR, Estimated: 36 mL/min — ABNORMAL LOW (ref >60)
Glucose, Bld: 276 mg/dL — ABNORMAL HIGH (ref 70–99)
Potassium: 4.3 mmol/L (ref 3.5–5.1)
Sodium: 133 mmol/L — ABNORMAL LOW (ref 135–145)

## 2024-05-26 LAB — GLUCOSE, CAPILLARY: Glucose-Capillary: 316 mg/dL — ABNORMAL HIGH (ref 70–99)

## 2024-05-26 MED ORDER — INSULIN ASPART 100 UNIT/ML IJ SOLN
5.0000 [IU] | Freq: Once | INTRAMUSCULAR | Status: AC
  Administered 2024-05-26: 5 [IU] via SUBCUTANEOUS

## 2024-05-26 MED ORDER — INSULIN ASPART 100 UNIT/ML IJ SOLN: 5.0000 [IU] | Freq: Three times a day (TID) | INTRAMUSCULAR | Status: DC

## 2024-05-26 NOTE — Discharge Summary (Signed)
 Physician Discharge Summary   Patient: Richard Mendoza MRN: [id number] DOB: 13-Jul-1976  Admit date:     05/23/2024  Discharge date: 05/26/24  Discharge Physician: Drue ONEIDA Potter   PCP: Carles Seltzer, MD   Recommendations at discharge:  Follow-up with PCP  Discharge Diagnoses:  Dehydration Hyponatremia Essential hypertension HLD (hyperlipidemia) PAD (peripheral artery disease) (HCC) Acute kidney injury superimposed on chronic kidney disease (HCC) Constipation-improved Gastroesophageal reflux disease Intractable nausea and vomiting  Hospital Course: Richard Mendoza is a 48 y.o. 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  He is taken multiple stool softeners and laxatives to no avail.  Over the last 4 days for presentation he has been unable to tolerate anything by mouth. CT of abdomen was negative.  Patient was found to have severe dehydration with hypovolemic hyponatremia requiring IV fluid resuscitation.  Nausea and vomiting now resolved and patient able to pass stool.  Has been cleared for discharge today and will follow up with PCP.  Consultants: None Procedures performed: None Disposition: Home Diet recommendation:  Cardiac diet DISCHARGE MEDICATION: Allergies as of 05/26/2024       Reactions   Oxycodone  Itching   Codeine Itching        Medication List     TAKE these medications    acetaminophen  500 MG tablet Commonly known as: TYLENOL  Take 1 tablet (500 mg total) by mouth 3 (three) times daily.   amLODipine  10 MG tablet Commonly known as: NORVASC  Take 1 tablet (10 mg total) by mouth every evening.   ascorbic acid  500 MG tablet Commonly known as: VITAMIN C  Take 1 tablet (500 mg total) by mouth daily.   atorvastatin  40 MG tablet Commonly known as: LIPITOR Take 40 mg by mouth  daily.   benazepril  40 MG tablet Commonly known as: LOTENSIN  Take 40 mg by mouth daily.   busPIRone  10 MG tablet Commonly known as: BUSPAR  Take 10 mg by mouth 2 (two) times daily.   cyanocobalamin  1000 MCG tablet Commonly known as: VITAMIN B12 Take 1,000 mcg by mouth daily.   Dexcom G7 Sensor Misc Inject 1 Application into the skin as directed. Change sensor every 10 days as directed.   Eliquis  5 MG Tabs tablet Generic drug: apixaban  Take 5 mg by mouth 2 (two) times daily.   famotidine  10 MG tablet Commonly known as: PEPCID  Take 10 mg by mouth 2 (two) times daily.   fenofibrate  54 MG tablet Take 1 tablet (54 mg total) by mouth daily.   ferrous sulfate  325 (65 FE) MG tablet Take 325 mg by mouth daily with breakfast.   furosemide  40 MG tablet Commonly known as: LASIX  Take 40 mg by mouth daily as needed.   HYDROmorphone  2 MG tablet Commonly known as: DILAUDID  Take 0.5-1 tablets (1-2 mg total) by mouth every 4 (four) hours as needed for severe pain (pain score 7-10).   hydrOXYzine 25 MG tablet Commonly known as: ATARAX Take 25 mg by mouth every 8 (eight) hours.   insulin  aspart 100 UNIT/ML injection Commonly known as: novoLOG  Inject 10-16 Units into the skin 3 (three) times daily with meals. 90-150= 10 units; 151-200= 11 units; 201-250= 12 units; 251-300= 13 units; 301-350= 14 units; 351-400= 15 units; 401 or higher = 16 units   insulin  glargine 100 UNIT/ML injection Commonly known as: LANTUS  Inject 0.5 mLs (50 Units total)  into the skin at bedtime.   INSULIN  SYRINGE .5CC/29G 29G X 1/2 0.5 ML Misc Use to inject insulin  4 times daily   iron  polysaccharides 150 MG capsule Commonly known as: NIFEREX Take 1 capsule (150 mg total) by mouth daily.   methocarbamol  500 MG tablet Commonly known as: ROBAXIN  Take 1 tablet (500 mg total) by mouth every 8 (eight) hours as needed for muscle spasms.   metoCLOPramide  10 MG tablet Commonly known as: REGLAN  Take 10 mg by  mouth 4 (four) times daily.   ondansetron  8 MG tablet Commonly known as: ZOFRAN  Take 8 mg by mouth every 8 (eight) hours as needed for nausea or vomiting.   pantoprazole  40 MG tablet Commonly known as: PROTONIX  Take 40 mg by mouth daily.   pioglitazone 30 MG tablet Commonly known as: ACTOS Take 30 mg by mouth daily.   pregabalin  75 MG capsule Commonly known as: LYRICA  Take 75 mg by mouth 2 (two) times daily.   sertraline  100 MG tablet Commonly known as: ZOLOFT  Take 100 mg by mouth daily.   sodium bicarbonate  650 MG tablet Take 1 tablet (650 mg total) by mouth 2 (two) times daily.   tiZANidine  4 MG tablet Commonly known as: ZANAFLEX  Take 4 mg by mouth every 6 (six) hours as needed for muscle spasms.   traZODone  100 MG tablet Commonly known as: DESYREL  Take 100 mg by mouth at bedtime.   Vitamin D  (Ergocalciferol ) 1.25 MG (50000 UNIT) Caps capsule Commonly known as: DRISDOL  Take 1 capsule (50,000 Units total) by mouth every 7 (seven) days.        Discharge Exam: Filed Weights   05/23/24 1134  Weight: 97.5 kg   General appearance - alert, well appearing, and in no distress Chest - clear to auscultation, no wheezes, rales or rhonchi, symmetric air entry Heart - normal rate, regular rhythm, normal S1, S2, no murmurs, rubs, clicks or gallops Abdomen - soft, nontender, nondistended, no masses or organomegaly Extremities -well-healed AKA on left    Condition at discharge: good  The results of significant diagnostics from this hospitalization (including imaging, microbiology, ancillary and laboratory) are listed below for reference.   Imaging Studies: CT ABDOMEN PELVIS WO CONTRAST Result Date: 05/23/2024 CLINICAL DATA:  Continuous nausea and vomiting. Epigastric pain. No recent bowel movements. EXAM: CT ABDOMEN AND PELVIS WITHOUT CONTRAST TECHNIQUE: Multidetector CT imaging of the abdomen and pelvis was performed following the standard protocol without IV contrast.  RADIATION DOSE REDUCTION: This exam was performed according to the departmental dose-optimization program which includes automated exposure control, adjustment of the mA and/or kV according to patient size and/or use of iterative reconstruction technique. COMPARISON:  None Available. FINDINGS: Lower chest: Lung bases are clear. Hepatobiliary: No focal hepatic lesion. Normal gallbladder. No biliary duct dilatation. Common bile duct is normal. Pancreas: Pancreas is normal. No ductal dilatation. No pancreatic inflammation. Spleen: Normal spleen Adrenals/urinary tract: Adrenal glands and kidneys are normal. The ureters and bladder normal. Stomach/Bowel: Stomach, small-bowel and cecum are normal. The appendix is not identified but there is no pericecal inflammation to suggest appendicitis. The colon and rectosigmoid colon are normal. Normal volume stool. No evidence obstruction. Vascular/Lymphatic: Abdominal aorta is normal caliber. No periportal or retroperitoneal adenopathy. No pelvic adenopathy. Reproductive: Unremarkable Other: No free fluid. Musculoskeletal: No aggressive osseous lesion. IMPRESSION: 1. No acute findings in the abdomen pelvis. 2. Normal bowel. Normal volume stool. 3. Normal gallbladder. Electronically Signed   By: Jackquline Boxer M.D.   On: 05/23/2024 13:34  Microbiology: Results for orders placed or performed during the hospital encounter of 01/23/24  Blood culture (routine x 2)     Status: None   Collection Time: 01/23/24  2:28 PM   Specimen: BLOOD  Result Value Ref Range Status   Specimen Description BLOOD RIGHT ANTECUBITAL  Final   Special Requests   Final    BOTTLES DRAWN AEROBIC AND ANAEROBIC Blood Culture adequate volume   Culture   Final    NO GROWTH 5 DAYS Performed at [facility], [address] [place], [place] [address]    Report Status 01/28/2024 FINAL  Final  Blood culture (routine x 2)     Status: None   Collection Time: 01/23/24  3:35 PM   Specimen:  BLOOD  Result Value Ref Range Status   Specimen Description BLOOD BLOOD RIGHT FOREARM  Final   Special Requests   Final    BOTTLES DRAWN AEROBIC AND ANAEROBIC Blood Culture results may not be optimal due to an inadequate volume of blood received in culture bottles   Culture   Final    NO GROWTH 5 DAYS Performed at [facility], [address] [place], [place] [address]    Report Status 01/28/2024 FINAL  Final    Labs: CBC: Recent Labs  Lab 05/23/24 1150 05/24/24 0524 05/25/24 0323 05/26/24 0435  WBC 9.7 7.8 8.3 8.8  NEUTROABS  --   --  6.1 6.6  HGB 13.0 11.7* 10.8* 10.6*  HCT 38.5* 34.4* 32.1* 32.8*  MCV 73.2* 72.6* 74.3* 75.6*  PLT 298 238 204 209   Basic Metabolic Panel: Recent Labs  Lab 05/23/24 1150 05/23/24 1445 05/24/24 0524 05/25/24 0323 05/26/24 0435  NA 124* 129* 131* 133* 133*  K 5.1 3.6 3.8 4.2 4.3  CL 95* 104 105 109 105  CO2 21* 21* 19* 20* 20*  GLUCOSE 506* 248* 181* 305* 276*  BUN 54* 51* 42* 40* 42*  CREATININE 3.00* 2.71* 2.25* 2.11* 2.18*  CALCIUM  8.7* 8.0* 8.6* 8.7* 8.8*   Liver Function Tests: Recent Labs  Lab 05/23/24 1150  AST 20  ALT 10  ALKPHOS 91  BILITOT 1.4*  PROT 7.1  ALBUMIN 3.7   CBG: Recent Labs  Lab 05/25/24 0741 05/25/24 1157 05/25/24 1713 05/25/24 2127 05/26/24 0801  GLUCAP 295* 313* 306* 275* 316*    Discharge time spent:  38 minutes.  Signed: Drue ONEIDA Potter, MD [facility] 05/26/2024

## 2024-05-26 NOTE — Progress Notes (Signed)
 Inpatient Diabetes Program Recommendations  AACE/ADA: New Consensus Statement on Inpatient Glycemic Control (2015)  Target Ranges:  Prepandial:   less than 140 mg/dL      Peak postprandial:   less than 180 mg/dL (1-2 hours)      Critically ill patients:  140 - 180 mg/dL   Lab Results  Component Value Date   GLUCAP 316 (H) 05/26/2024   HGBA1C 9.2 (H) 05/25/2024    Review of Glycemic Control  Latest Reference Range & Units 05/25/24 07:41 05/25/24 11:57 05/25/24 17:13 05/25/24 21:27 05/26/24 08:01  Glucose-Capillary 70 - 99 mg/dL 704 (H) 686 (H) 693 (H) 275 (H) 316 (H)   Diabetes history: DM 2 Outpatient Diabetes medications:  Dexcom G7 Novolog  10-16 units tid with meals Lantus  50 units q HS Actos 30 mg daily Current orders for Inpatient glycemic control:  Novolog  0-9 units tid with meals and HS Semglee  50 units daily Novolog  5 units tid with meals   Inpatient Diabetes Program Recommendations:    Note A1C is still elevated but much improved from March 2025. Consider increasing Novolog  meal coverage to 10 units tid with meals (hold if patient eats less than 50% or NPO).   Thanks,  Randall Bullocks, RN, BC-ADM Inpatient Diabetes Coordinator Pager [phone]  (8a-5p)

## 2024-06-10 ENCOUNTER — Telehealth (INDEPENDENT_AMBULATORY_CARE_PROVIDER_SITE_OTHER): Payer: Self-pay

## 2024-06-10 NOTE — Telephone Encounter (Signed)
 Patient reached out in reference to notes from previous in office visit pertaining to his AKA so he can get his prosthetic. Patient needed last visit notes sent to [facility] for insurance purposes. Notes have been sent to [facility] at this time.

## 2024-06-12 ENCOUNTER — Other Ambulatory Visit (HOSPITAL_COMMUNITY): Payer: Self-pay

## 2024-06-12 ENCOUNTER — Ambulatory Visit (INDEPENDENT_AMBULATORY_CARE_PROVIDER_SITE_OTHER): Admitting: Nurse Practitioner

## 2024-06-12 ENCOUNTER — Encounter (INDEPENDENT_AMBULATORY_CARE_PROVIDER_SITE_OTHER): Payer: Self-pay

## 2024-06-12 ENCOUNTER — Telehealth: Payer: Self-pay | Admitting: *Deleted

## 2024-06-12 ENCOUNTER — Encounter: Payer: Self-pay | Admitting: Nurse Practitioner

## 2024-06-12 ENCOUNTER — Telehealth: Payer: Self-pay

## 2024-06-12 VITALS — BP 104/72 | HR 95 | Ht 70.0 in | Wt 205.0 lb

## 2024-06-12 DIAGNOSIS — E1165 Type 2 diabetes mellitus with hyperglycemia: Secondary | ICD-10-CM | POA: Diagnosis not present

## 2024-06-12 DIAGNOSIS — Z794 Long term (current) use of insulin: Secondary | ICD-10-CM | POA: Diagnosis not present

## 2024-06-12 DIAGNOSIS — I10 Essential (primary) hypertension: Secondary | ICD-10-CM | POA: Diagnosis not present

## 2024-06-12 DIAGNOSIS — E782 Mixed hyperlipidemia: Secondary | ICD-10-CM | POA: Diagnosis not present

## 2024-06-12 MED ORDER — INSULIN GLARGINE 100 UNIT/ML ~~LOC~~ SOLN: 60.0000 [IU] | Freq: Every day | SUBCUTANEOUS | 1 refills | Status: DC

## 2024-06-12 NOTE — Telephone Encounter (Signed)
 Patient seen in the office today. He states that he was told at his pharmacy that they had sent to our office a request for a PA to be completed for his Dexcom G 7 sensors. Patient has the last one that he has on his arm.  Will send to the PA team to see if they have seen this and get a status or ask that they please start a PA on behalf of the patient.

## 2024-06-12 NOTE — Progress Notes (Signed)
 Endocrinology Follow Up Note       06/12/2024, 10:51 AM   Subjective:    Patient ID: Richard Mendoza, male    DOB: [birth year].  Richard Mendoza is being seen in follow up after being seen in consultation for management of currently uncontrolled symptomatic diabetes requested by  Carles Seltzer, MD.  He previously saw Endocrinology at [facility] but he notes his previous provider left the practice without much notice.   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    Past Surgical History:  Procedure Laterality Date   AMPUTATION Left 11/07/2020   Procedure: 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;  Surgeon: Neill Boas, DPM;  Location: [facility];  Service: Podiatry;  Laterality: Left;   AMPUTATION Left 11/16/2020   Procedure: AMPUTATION BELOW KNEE;  Surgeon: Marea Selinda RAMAN, MD;  Location: [facility];  Service: Vascular;  Laterality: Left;   AMPUTATION Left 01/29/2024   Procedure: AMPUTATION, ABOVE KNEE;  Surgeon: Marea Selinda RAMAN, MD;  Location: [facility];  Service: General;  Laterality: Left;   ANTERIOR CERVICAL DECOMP/DISCECTOMY FUSION N/A 09/09/2017   Procedure: ANTERIOR CERVICAL DECOMPRESSION/DISCECTOMY FUSION CERVICAL 6- CERVICAL 7;  Surgeon: Gillie Duncans, MD;  Location: [facility];  Service: Neurosurgery;  Laterality: N/A;  ANTERIOR CERVICAL DECOMPRESSION/DISCECTOMY FUSION CERVICAL 6- CERVICAL 7   APPENDECTOMY     I & D EXTREMITY Right 10/03/2017   Procedure: IRRIGATION AND DEBRIDEMENT RIGHT WRIST;  Surgeon: Murrell Drivers, MD;  Location: [facility];  Service: Orthopedics;  Laterality: Right;   I & D EXTREMITY Right 11/26/2018   Procedure: IRRIGATION AND DEBRIDEMENT FASCIA ON RIGHT FOOT;   Surgeon: Ashley Soulier, DPM;  Location: [facility];  Service: Podiatry;  Laterality: Right;   INCISION AND DRAINAGE Right 03/06/2019   Procedure: INCISION AND DRAINAGE RIGHT FOOT, WITH 4th RAY AMPUTATION;  Surgeon: Ashley Soulier, DPM;  Location: [facility];  Service: Podiatry;  Laterality: Right;   INCISION AND DRAINAGE Left 11/07/2020   Procedure: INCISION AND DRAINAGE;  Surgeon: Neill Boas, DPM;  Location: [facility];  Service: Podiatry;  Laterality: Left;   INCISION AND DRAINAGE ABSCESS Left 05/02/2023   Procedure: INCISION AND DRAINAGE ABSCESS LEFT LOWER EXTREMITY;  Surgeon: Marea Selinda RAMAN, MD;  Location: [facility];  Service: Vascular;  Laterality: Left;   IRRIGATION AND DEBRIDEMENT FOOT Left 11/11/2020   Procedure: IRRIGATION AND DEBRIDEMENT FOOT;  Surgeon: Neill Boas, DPM;  Location: [facility];  Service: Podiatry;  Laterality: Left;   METATARSAL HEAD EXCISION Right 05/15/2019   Procedure: OSTECTOMY;MET HEAD 5;  Surgeon: Ashley Soulier, DPM;  Location: [facility];  Service: Podiatry;  Laterality: Right;   osteomylitis     ROTATOR CUFF REPAIR Left    SHOULDER ARTHROSCOPY WITH DEBRIDEMENT AND BICEP TENDON REPAIR Right 08/27/2023   Procedure: SHOULDER ARTHROSCOPY WITH INCISION AND DRAINAGE;  Surgeon: Tobie Priest, MD;  Location: [facility];  Service: Orthopedics;  Laterality: Right;    Social History   Socioeconomic History  Marital status: Married    Spouse name: Not on file   Number of children: 4   Years of education: Not on file   Highest education level: Not on file  Occupational History   Not on file  Tobacco Use   Smoking status: Former    Current packs/day: 0.00    Average packs/day: 1 pack/day for 17.0 years (17.0 ttl pk-yrs)    Types: Cigarettes    Start date: 01/14/2002    Quit date: 2024    Years since quitting: 1.5   Smokeless tobacco: Never  Vaping Use   Vaping status: Never Used  Substance and Sexual Activity   Alcohol use: Not Currently    Comment: rare   Drug use: No    Sexual activity: Yes  Other Topics Concern   Not on file  Social History Narrative   Not on file   Social Drivers of Health   Financial Resource Strain: Low Risk  (07/24/2022)   Received from [facility]   Overall Financial Resource Strain (CARDIA)    Difficulty of Paying Living Expenses: Not hard at all  Food Insecurity: Patient Declined (05/24/2024)   Hunger Vital Sign    Worried About Running Out of Food in the Last Year: Patient declined    Ran Out of Food in the Last Year: Patient declined  Transportation Needs: No Transportation Needs (05/24/2024)   PRAPARE - Administrator, Civil Service (Medical): No    Lack of Transportation (Non-Medical): No  Physical Activity: Not on file  Stress: No Stress Concern Present (07/24/2022)   Received from [facility] - Occupational Stress Questionnaire    Feeling of Stress : Not at all  Social Connections: Unknown (03/21/2022)   Received from [facility]   Social Network    Social Network: Not on file    Family History  Problem Relation Age of Onset   Diabetes Mother    Heart disease Father    Diabetes Father    Arthritis Other    Hyperlipidemia Other    Hypertension Other    Cancer Other        breast   Mental illness Neg Hx     Outpatient Encounter Medications as of 06/12/2024  Medication Sig   acetaminophen  (TYLENOL ) 500 MG tablet Take 1 tablet (500 mg total) by mouth 3 (three) times daily.   ascorbic acid  (VITAMIN C ) 500 MG tablet Take 1 tablet (500 mg total) by mouth daily.   atorvastatin  (LIPITOR) 40 MG tablet Take 40 mg by mouth daily.   benazepril  (LOTENSIN ) 40 MG tablet Take 40 mg by mouth daily.   busPIRone  (BUSPAR ) 10 MG tablet Take 10 mg by mouth 2 (two) times daily.   Continuous Glucose Sensor (DEXCOM G7 SENSOR) MISC Inject 1 Application into the skin as directed. Change sensor every 10 days as directed.   cyanocobalamin  (VITAMIN B12) 1000 MCG tablet Take 1,000  mcg by mouth daily.   ELIQUIS  5 MG TABS tablet Take 5 mg by mouth 2 (two) times daily.   famotidine  (PEPCID ) 10 MG tablet Take 10 mg by mouth 2 (two) times daily.   fenofibrate  54 MG tablet Take 1 tablet (54 mg total) by mouth daily.   ferrous sulfate  325 (65 FE) MG tablet Take 325 mg by mouth daily with breakfast.   furosemide  (LASIX ) 40 MG tablet Take 40 mg by mouth daily as needed.   HYDROmorphone  (DILAUDID ) 2 MG tablet Take 0.5-1  tablets (1-2 mg total) by mouth every 4 (four) hours as needed for severe pain (pain score 7-10).   hydrOXYzine (ATARAX) 25 MG tablet Take 25 mg by mouth every 8 (eight) hours.   insulin  aspart (NOVOLOG ) 100 UNIT/ML injection Inject 10-16 Units into the skin 3 (three) times daily with meals. 90-150= 10 units; 151-200= 11 units; 201-250= 12 units; 251-300= 13 units; 301-350= 14 units; 351-400= 15 units; 401 or higher = 16 units   INSULIN  SYRINGE .5CC/29G 29G X 1/2 0.5 ML MISC Use to inject insulin  4 times daily   methocarbamol  (ROBAXIN ) 500 MG tablet Take 1 tablet (500 mg total) by mouth every 8 (eight) hours as needed for muscle spasms.   metoCLOPramide  (REGLAN ) 10 MG tablet Take 10 mg by mouth 4 (four) times daily.   ondansetron  (ZOFRAN ) 8 MG tablet Take 8 mg by mouth every 8 (eight) hours as needed for nausea or vomiting.   pantoprazole  (PROTONIX ) 40 MG tablet Take 40 mg by mouth daily.   pregabalin  (LYRICA ) 75 MG capsule Take 75 mg by mouth 2 (two) times daily.   sertraline  (ZOLOFT ) 100 MG tablet Take 100 mg by mouth daily.   tiZANidine  (ZANAFLEX ) 4 MG tablet Take 4 mg by mouth every 6 (six) hours as needed for muscle spasms.   traZODone  (DESYREL ) 100 MG tablet Take 100 mg by mouth at bedtime.   [DISCONTINUED] insulin  glargine (LANTUS ) 100 UNIT/ML injection Inject 0.5 mLs (50 Units total) into the skin at bedtime.   insulin  glargine (LANTUS ) 100 UNIT/ML injection Inject 0.6 mLs (60 Units total) into the skin at bedtime.   Vitamin D , Ergocalciferol , (DRISDOL ) 1.25  MG (50000 UNIT) CAPS capsule Take 1 capsule (50,000 Units total) by mouth every 7 (seven) days.   [DISCONTINUED] amLODipine  (NORVASC ) 10 MG tablet Take 1 tablet (10 mg total) by mouth every evening. (Patient not taking: Reported on 06/12/2024)   [DISCONTINUED] iron  polysaccharides (NIFEREX) 150 MG capsule Take 1 capsule (150 mg total) by mouth daily. (Patient not taking: Reported on 06/12/2024)   [DISCONTINUED] pioglitazone (ACTOS) 30 MG tablet Take 30 mg by mouth daily. (Patient not taking: Reported on 06/12/2024)   [DISCONTINUED] sodium bicarbonate  650 MG tablet Take 1 tablet (650 mg total) by mouth 2 (two) times daily. (Patient not taking: Reported on 06/12/2024)   No facility-administered encounter medications on file as of 06/12/2024.    ALLERGIES: Allergies  Allergen Reactions   Oxycodone  Itching   Codeine Itching    VACCINATION STATUS: Immunization History  Administered Date(s) Administered   Tdap 07/29/2016, 07/23/2018, 11/25/2018    Diabetes He presents for his follow-up diabetic visit. He has type 2 diabetes mellitus. Onset time: diagnosed at approx age of 89. His disease course has been fluctuating. Hypoglycemia symptoms include nervousness/anxiousness, sweats and tremors. Associated symptoms include blurred vision, fatigue, foot paresthesias, polydipsia and polyuria. There are no hypoglycemic complications. Diabetic complications include nephropathy, peripheral neuropathy and PVD (hx Left AKA). Risk factors for coronary artery disease include diabetes mellitus, dyslipidemia, male sex, hypertension, tobacco exposure, sedentary lifestyle and family history. Current diabetic treatment includes intensive insulin  program. He is compliant with treatment most of the time. His weight is fluctuating minimally. He is following a generally unhealthy diet. When asked about meal planning, he reported none. He has not had a previous visit with a dietitian. He participates in exercise intermittently.  His home blood glucose trend is fluctuating dramatically. (He presents today with his CGM showing above target glycemic profile overall.  His most recent A1c, checked  on 7/7 was 9.2%, improving from last A1c of 13.6%.  Analysis of his CGM shows TIR 12%, TAR 88%, TBR 0% with a GMI of 10%.  He has been released from rehab facility where he was regaining strength from his amputation.  He notes he has struggled with meal patterns and routines which are contributing to his fluctuating glucose.  He is ready to switch to the Omnipod 5.) An ACE inhibitor/angiotensin II receptor blocker is being taken. He sees a podiatrist.Eye exam is current.     Review of systems  Constitutional: + increasing body weight, current Body mass index is 29.41 kg/m., no fatigue, no subjective hyperthermia, no subjective hypothermia Eyes: no blurry vision, no xerophthalmia ENT: no sore throat, no nodules palpated in throat, no dysphagia/odynophagia, no hoarseness Cardiovascular: no chest pain, no shortness of breath, no palpitations, no leg swelling Respiratory: no cough, no shortness of breath Gastrointestinal: no nausea/vomiting/diarrhea Musculoskeletal: no muscle/joint aches, hx L AKA- in WC-waiting on prosthesis to be delivered. Skin: no rashes, no hyperemia Neurological: no tremors, no numbness, no tingling, no dizziness Psychiatric: no depression, no anxiety  Objective:     BP 104/72 (BP Location: Left Arm, Patient Position: Sitting, Cuff Size: Large)   Pulse 95   Ht 5' 10 (1.778 m)   Wt 205 lb (93 kg) Comment: Patient reported - wheel chair/Amputee  BMI 29.41 kg/m   Wt Readings from Last 3 Encounters:  06/12/24 205 lb (93 kg)  05/23/24 215 lb (97.5 kg)  04/10/24 192 lb (87.1 kg)     BP Readings from Last 3 Encounters:  06/12/24 104/72  05/26/24 (!) 164/100  04/10/24 119/83     Physical Exam- Limited  Constitutional:  Body mass index is 29.41 kg/m. , not in acute distress, normal state of  mind Eyes:  EOMI, no exophthalmos Musculoskeletal: L-AKA- in WC, strength intact in all four extremities, no gross restriction of joint movements Skin:  no rashes, no hyperemia Neurological: no tremor with outstretched hands   Diabetic Foot Exam - Simple   No data filed      CMP ( most recent) CMP     Component Value Date/Time   NA 133 (L) 05/26/2024 0435   K 4.3 05/26/2024 0435   CL 105 05/26/2024 0435   CO2 20 (L) 05/26/2024 0435   GLUCOSE 276 (H) 05/26/2024 0435   BUN 42 (H) 05/26/2024 0435   CREATININE 2.18 (H) 05/26/2024 0435   CALCIUM  8.8 (L) 05/26/2024 0435   PROT 7.1 05/23/2024 1150   ALBUMIN 3.7 05/23/2024 1150   AST 20 05/23/2024 1150   ALT 10 05/23/2024 1150   ALKPHOS 91 05/23/2024 1150   BILITOT 1.4 (H) 05/23/2024 1150   GFR 118.24 10/24/2011 1654   GFRNONAA 36 (L) 05/26/2024 0435     Diabetic Labs (most recent): Lab Results  Component Value Date   HGBA1C 9.2 (H) 05/25/2024   HGBA1C 13.6 (H) 01/24/2024   HGBA1C 13.2 (H) 08/20/2023   MICROALBUR 2.2 (H) 06/02/2010     Lipid Panel ( most recent) Lipid Panel     Component Value Date/Time   CHOL 273 (H) 10/24/2011 1654   TRIG 827.0 (H) 10/24/2011 1654   HDL 35.90 (L) 10/24/2011 1654   CHOLHDL 8 10/24/2011 1654   VLDL 165.4 (H) 10/24/2011 1654   LDLDIRECT 130.2 10/24/2011 1654      Lab Results  Component Value Date   TSH 0.581 05/23/2024           Assessment & Plan:  1) Type 2 diabetes mellitus with hyperglycemia, with long-term current use of insulin  (HCC) (Primary)  He presents today with his CGM showing above target glycemic profile overall.  His most recent A1c, checked on 7/7 was 9.2%, improving from last A1c of 13.6%.  Analysis of his CGM shows TIR 12%, TAR 88%, TBR 0% with a GMI of 10%.  He has been released from rehab facility where he was regaining strength from his amputation.  He notes he has struggled with meal patterns and routines which are contributing to his fluctuating  glucose.  He is ready to switch to the Omnipod 5.  - Richard Mendoza has currently uncontrolled symptomatic type 2 DM since 48 years of age.   -Recent labs reviewed.  - I had a long discussion with him about the progressive nature of diabetes and the pathology behind its complications. -his diabetes is complicated by PVD (Hx L-AKA), CKD stage 3b, neuropathy and he remains at a high risk for more acute and chronic complications which include CAD, CVA, CKD, retinopathy, and neuropathy. These are all discussed in detail with him.  The following Lifestyle Medicine recommendations according to American College of Lifestyle Medicine [facility]) were discussed and offered to patient and he agrees to start the journey:  A. Whole Foods, Plant-based plate comprising of fruits and vegetables, plant-based proteins, whole-grain carbohydrates was discussed in detail with the patient.   A list for source of those nutrients were also provided to the patient.  Patient will use only water  or unsweetened tea for hydration. B.  The need to stay away from risky substances including alcohol, smoking; obtaining 7 to 9 hours of restorative sleep, at least 150 minutes of moderate intensity exercise weekly, the importance of healthy social connections,  and stress reduction techniques were discussed. C.  A full color page of  Calorie density of various food groups per pound showing examples of each food groups was provided to the patient.  - Nutritional counseling repeated at each appointment due to patients tendency to fall back in to old habits.  - The patient admits there is a room for improvement in their diet and drink choices. -  Suggestion is made for the patient to avoid simple carbohydrates from their diet including Cakes, Sweet Desserts / Pastries, Ice Cream, Soda (diet and regular), Sweet Tea, Candies, Chips, Cookies, Sweet Pastries, Store Bought Juices, Alcohol in Excess of 1-2 drinks a day, Artificial  Sweeteners, Coffee Creamer, and Sugar-free Products. This will help patient to have stable blood glucose profile and potentially avoid unintended weight gain.   - I encouraged the patient to switch to unprocessed or minimally processed complex starch and increased protein intake (animal or plant source), fruits, and vegetables.   - Patient is advised to stick to a routine mealtimes to eat 3 meals a day and avoid unnecessary snacks (to snack only to correct hypoglycemia).  - I have approached him with the following individualized plan to manage his diabetes and patient agrees:   -He is advised to increase Lantus  to 60 units SQ nightly and continue Novolog  10-16 units TID with meals if glucose is above 90 and he is eating (Specific instructions on how to titrate insulin  dosage based on glucose readings given to patient in writing).  Will start working on getting him set up to use his Omnipod moving forward.  He has the PDM and pods ready to start, just needs pump settings and a virtual training.  I did reach out to [place],  our Omnipod trainer to see if she can help facilitate this.  -he is encouraged to start/continue monitoring glucose 4 times daily, before meals and before bed (using his CGM), and to call the clinic if he has readings less than 70 or above 300 for 3 tests in a row.  - he is warned not to take insulin  without proper monitoring per orders. - Adjustment parameters are given to him for hypo and hyperglycemia in writing.  - he is not a candidate for Metformin due to concurrent renal insufficiency.  He notes his nephrologist was discussing trying a different medication that may help with kidney function (perhaps [place]).  He was previously on Jardiance but was taken off around the same time as he was having osteomyelitis.  I do not feel that we need to initiate SGLT2i from a diabetes perspective as it has weak A1c reduction properties.  Due to side effects and cost, will keep him off of  this for now.  - he is not an ideal candidate for incretin therapy given body habitus (BMI less than 25) and heavy smoking history (quit in October 2024) which increases his risk of pancreatitis.  - Specific targets for  A1c; LDL, HDL, and Triglycerides were discussed with the patient.  2) Blood Pressure /Hypertension:  his blood pressure is controlled to target.   he is advised to continue his current medications as prescribed by his PCP.  3) Lipids/Hyperlipidemia:    There is no recent lipid panel available to review, nor does he appear to be on any statin medication.    4)  Weight/Diet:  his Body mass index is 29.41 kg/m.  -  he is NOT a candidate for weight loss.   Exercise, and detailed carbohydrates information provided  -  detailed on discharge instructions.  5) Chronic Care/Health Maintenance: -he is on ACEI/ARB and is not on Statin medications and is encouraged to initiate and continue to follow up with Ophthalmology, Dentist, Podiatrist at least yearly or according to recommendations, and advised to stay away from smoking. I have recommended yearly flu vaccine and pneumonia vaccine at least every 5 years; moderate intensity exercise for up to 150 minutes weekly; and sleep for at least 7 hours a day.  - he is advised to maintain close follow up with Zakhia, Karl, MD for primary care needs, as well as his other providers for optimal and coordinated care.    I spent  49  minutes in the care of the patient today including review of labs from CMP, Lipids, Thyroid Function, Hematology (current and previous including abstractions from other facilities); face-to-face time discussing  his blood glucose readings/logs, discussing hypoglycemia and hyperglycemia episodes and symptoms, medications doses, his options of short and long term treatment based on the latest standards of care / guidelines;  discussion about incorporating lifestyle medicine;  and documenting the encounter. Risk reduction  counseling performed per USPSTF guidelines to reduce obesity and cardiovascular risk factors.     Please refer to Patient Instructions for Blood Glucose Monitoring and Insulin /Medications Dosing Guide  in media tab for additional information. Please  also refer to  Patient Self Inventory in the Media  tab for reviewed elements of pertinent patient history.  Ozell Hamilton Mower participated in the discussions, expressed understanding, and voiced agreement with the above plans.  All questions were answered to his satisfaction. he is encouraged to contact clinic should he have any questions or concerns prior to his return visit.     Follow  up plan: - Return in about 3 months (around 09/12/2024) for Diabetes F/U with A1c in office, No previsit labs, Bring meter and logs.   Benton Rio, [facility] [address] [place], [place] [address] Phone: [phone] Fax: [phone]  06/12/2024, 10:51 AM

## 2024-06-12 NOTE — Telephone Encounter (Signed)
 Pharmacy Patient Advocate Encounter   Received notification from Pt Calls Messages that prior authorization for Dexcom G7 sensor is required/requested.   Insurance verification completed.   The patient is insured through [facility] MEDICAID .   Per test claim: PA required and submitted KEY/EOC/Request #: [id number] WAPPROVED from 06/12/24 to 12/12/24. Ran test claim, Copay is $0. This test claim was processed through [facility]- copay amounts may vary at other pharmacies due to pharmacy/plan contracts, or as the patient moves through the different stages of their insurance plan.

## 2024-06-13 ENCOUNTER — Inpatient Hospital Stay
Admission: EM | Admit: 2024-06-13 | Discharge: 2024-06-25 | DRG: 854 | Disposition: A | Discharge status: Skilled Nursing Facility | Attending: Internal Medicine | Admitting: Internal Medicine

## 2024-06-13 ENCOUNTER — Other Ambulatory Visit: Payer: Self-pay

## 2024-06-13 ENCOUNTER — Emergency Department

## 2024-06-13 DIAGNOSIS — Z604 Social exclusion and rejection: Secondary | ICD-10-CM | POA: Diagnosis present

## 2024-06-13 DIAGNOSIS — I509 Heart failure, unspecified: Secondary | ICD-10-CM | POA: Diagnosis not present

## 2024-06-13 DIAGNOSIS — R6520 Severe sepsis without septic shock: Secondary | ICD-10-CM | POA: Diagnosis present

## 2024-06-13 DIAGNOSIS — R159 Full incontinence of feces: Secondary | ICD-10-CM | POA: Diagnosis not present

## 2024-06-13 DIAGNOSIS — I5032 Chronic diastolic (congestive) heart failure: Secondary | ICD-10-CM | POA: Diagnosis not present

## 2024-06-13 DIAGNOSIS — N1832 Chronic kidney disease, stage 3b: Secondary | ICD-10-CM | POA: Diagnosis not present

## 2024-06-13 DIAGNOSIS — N492 Inflammatory disorders of scrotum: Secondary | ICD-10-CM | POA: Diagnosis not present

## 2024-06-13 DIAGNOSIS — E1065 Type 1 diabetes mellitus with hyperglycemia: Secondary | ICD-10-CM | POA: Diagnosis present

## 2024-06-13 DIAGNOSIS — Z87891 Personal history of nicotine dependence: Secondary | ICD-10-CM

## 2024-06-13 DIAGNOSIS — N281 Cyst of kidney, acquired: Secondary | ICD-10-CM | POA: Diagnosis not present

## 2024-06-13 DIAGNOSIS — A429 Actinomycosis, unspecified: Secondary | ICD-10-CM | POA: Diagnosis not present

## 2024-06-13 DIAGNOSIS — Z89512 Acquired absence of left leg below knee: Secondary | ICD-10-CM | POA: Diagnosis not present

## 2024-06-13 DIAGNOSIS — I10 Essential (primary) hypertension: Secondary | ICD-10-CM | POA: Diagnosis not present

## 2024-06-13 DIAGNOSIS — E1122 Type 2 diabetes mellitus with diabetic chronic kidney disease: Secondary | ICD-10-CM | POA: Diagnosis not present

## 2024-06-13 DIAGNOSIS — Z1612 Extended spectrum beta lactamase (ESBL) resistance: Secondary | ICD-10-CM | POA: Diagnosis not present

## 2024-06-13 DIAGNOSIS — I700 Atherosclerosis of aorta: Secondary | ICD-10-CM | POA: Diagnosis present

## 2024-06-13 DIAGNOSIS — B9689 Other specified bacterial agents as the cause of diseases classified elsewhere: Secondary | ICD-10-CM | POA: Diagnosis not present

## 2024-06-13 DIAGNOSIS — Z96641 Presence of right artificial hip joint: Secondary | ICD-10-CM | POA: Diagnosis not present

## 2024-06-13 DIAGNOSIS — F32A Depression, unspecified: Secondary | ICD-10-CM | POA: Diagnosis present

## 2024-06-13 DIAGNOSIS — R000 Tachycardia, unspecified: Secondary | ICD-10-CM | POA: Diagnosis not present

## 2024-06-13 DIAGNOSIS — Z8614 Personal history of Methicillin resistant Staphylococcus aureus infection: Secondary | ICD-10-CM

## 2024-06-13 DIAGNOSIS — I739 Peripheral vascular disease, unspecified: Secondary | ICD-10-CM | POA: Diagnosis not present

## 2024-06-13 DIAGNOSIS — N179 Acute kidney failure, unspecified: Secondary | ICD-10-CM | POA: Diagnosis not present

## 2024-06-13 DIAGNOSIS — E785 Hyperlipidemia, unspecified: Secondary | ICD-10-CM | POA: Diagnosis present

## 2024-06-13 DIAGNOSIS — I96 Gangrene, not elsewhere classified: Secondary | ICD-10-CM | POA: Diagnosis present

## 2024-06-13 DIAGNOSIS — Z794 Long term (current) use of insulin: Secondary | ICD-10-CM | POA: Diagnosis not present

## 2024-06-13 DIAGNOSIS — M62838 Other muscle spasm: Secondary | ICD-10-CM | POA: Diagnosis not present

## 2024-06-13 DIAGNOSIS — B961 Klebsiella pneumoniae [K. pneumoniae] as the cause of diseases classified elsewhere: Secondary | ICD-10-CM | POA: Diagnosis not present

## 2024-06-13 DIAGNOSIS — I452 Bifascicular block: Secondary | ICD-10-CM | POA: Diagnosis present

## 2024-06-13 DIAGNOSIS — A419 Sepsis, unspecified organism: Principal | ICD-10-CM | POA: Diagnosis present

## 2024-06-13 DIAGNOSIS — D631 Anemia in chronic kidney disease: Secondary | ICD-10-CM | POA: Diagnosis present

## 2024-06-13 DIAGNOSIS — I130 Hypertensive heart and chronic kidney disease with heart failure and stage 1 through stage 4 chronic kidney disease, or unspecified chronic kidney disease: Secondary | ICD-10-CM | POA: Diagnosis not present

## 2024-06-13 DIAGNOSIS — D509 Iron deficiency anemia, unspecified: Secondary | ICD-10-CM | POA: Diagnosis not present

## 2024-06-13 DIAGNOSIS — D649 Anemia, unspecified: Secondary | ICD-10-CM | POA: Diagnosis not present

## 2024-06-13 DIAGNOSIS — R5381 Other malaise: Secondary | ICD-10-CM | POA: Diagnosis present

## 2024-06-13 DIAGNOSIS — Z6829 Body mass index (BMI) 29.0-29.9, adult: Secondary | ICD-10-CM

## 2024-06-13 DIAGNOSIS — K219 Gastro-esophageal reflux disease without esophagitis: Secondary | ICD-10-CM | POA: Diagnosis not present

## 2024-06-13 DIAGNOSIS — R590 Localized enlarged lymph nodes: Secondary | ICD-10-CM | POA: Diagnosis not present

## 2024-06-13 DIAGNOSIS — F419 Anxiety disorder, unspecified: Secondary | ICD-10-CM | POA: Diagnosis present

## 2024-06-13 DIAGNOSIS — N5089 Other specified disorders of the male genital organs: Secondary | ICD-10-CM | POA: Diagnosis not present

## 2024-06-13 DIAGNOSIS — L03315 Cellulitis of perineum: Secondary | ICD-10-CM | POA: Diagnosis present

## 2024-06-13 DIAGNOSIS — Z993 Dependence on wheelchair: Secondary | ICD-10-CM

## 2024-06-13 DIAGNOSIS — E1165 Type 2 diabetes mellitus with hyperglycemia: Secondary | ICD-10-CM | POA: Diagnosis not present

## 2024-06-13 DIAGNOSIS — Z89612 Acquired absence of left leg above knee: Secondary | ICD-10-CM | POA: Diagnosis not present

## 2024-06-13 DIAGNOSIS — G894 Chronic pain syndrome: Secondary | ICD-10-CM | POA: Diagnosis present

## 2024-06-13 DIAGNOSIS — E1040 Type 1 diabetes mellitus with diabetic neuropathy, unspecified: Secondary | ICD-10-CM | POA: Diagnosis not present

## 2024-06-13 DIAGNOSIS — N189 Chronic kidney disease, unspecified: Secondary | ICD-10-CM | POA: Diagnosis not present

## 2024-06-13 DIAGNOSIS — E1022 Type 1 diabetes mellitus with diabetic chronic kidney disease: Secondary | ICD-10-CM | POA: Diagnosis not present

## 2024-06-13 DIAGNOSIS — Z9641 Presence of insulin pump (external) (internal): Secondary | ICD-10-CM | POA: Diagnosis present

## 2024-06-13 DIAGNOSIS — N183 Chronic kidney disease, stage 3 unspecified: Secondary | ICD-10-CM | POA: Insufficient documentation

## 2024-06-13 DIAGNOSIS — E1052 Type 1 diabetes mellitus with diabetic peripheral angiopathy with gangrene: Secondary | ICD-10-CM | POA: Diagnosis present

## 2024-06-13 DIAGNOSIS — Z1152 Encounter for screening for COVID-19: Secondary | ICD-10-CM | POA: Diagnosis not present

## 2024-06-13 DIAGNOSIS — L02215 Cutaneous abscess of perineum: Secondary | ICD-10-CM | POA: Diagnosis not present

## 2024-06-13 DIAGNOSIS — E663 Overweight: Secondary | ICD-10-CM | POA: Diagnosis present

## 2024-06-13 DIAGNOSIS — Z7901 Long term (current) use of anticoagulants: Secondary | ICD-10-CM | POA: Diagnosis not present

## 2024-06-13 DIAGNOSIS — A498 Other bacterial infections of unspecified site: Secondary | ICD-10-CM | POA: Diagnosis not present

## 2024-06-13 DIAGNOSIS — L03314 Cellulitis of groin: Secondary | ICD-10-CM | POA: Diagnosis not present

## 2024-06-13 DIAGNOSIS — Z79899 Other long term (current) drug therapy: Secondary | ICD-10-CM

## 2024-06-13 DIAGNOSIS — N1831 Chronic kidney disease, stage 3a: Secondary | ICD-10-CM | POA: Diagnosis not present

## 2024-06-13 DIAGNOSIS — E1051 Type 1 diabetes mellitus with diabetic peripheral angiopathy without gangrene: Secondary | ICD-10-CM | POA: Diagnosis not present

## 2024-06-13 DIAGNOSIS — Z751 Person awaiting admission to adequate facility elsewhere: Secondary | ICD-10-CM

## 2024-06-13 DIAGNOSIS — E8721 Acute metabolic acidosis: Secondary | ICD-10-CM | POA: Diagnosis present

## 2024-06-13 DIAGNOSIS — R338 Other retention of urine: Secondary | ICD-10-CM | POA: Diagnosis present

## 2024-06-13 DIAGNOSIS — Z833 Family history of diabetes mellitus: Secondary | ICD-10-CM

## 2024-06-13 DIAGNOSIS — Z5982 Transportation insecurity: Secondary | ICD-10-CM

## 2024-06-13 DIAGNOSIS — G4700 Insomnia, unspecified: Secondary | ICD-10-CM | POA: Diagnosis not present

## 2024-06-13 DIAGNOSIS — Z885 Allergy status to narcotic agent status: Secondary | ICD-10-CM

## 2024-06-13 DIAGNOSIS — Z8249 Family history of ischemic heart disease and other diseases of the circulatory system: Secondary | ICD-10-CM

## 2024-06-13 LAB — URINALYSIS, W/ REFLEX TO CULTURE (INFECTION SUSPECTED)
Bacteria, UA: NONE SEEN
Bilirubin Urine: NEGATIVE
Glucose, UA: >=500 mg/dL — AB
Hgb urine dipstick: NEGATIVE
Ketones, ur: NEGATIVE mg/dL
Leukocytes,Ua: NEGATIVE
Nitrite: NEGATIVE
Protein, ur: 100 mg/dL — AB
Specific Gravity, Urine: 1.011 (ref 1.005–1.030)
pH: 5 (ref 5.0–8.0)

## 2024-06-13 LAB — COMPREHENSIVE METABOLIC PANEL WITH GFR
ALT: 13 U/L (ref 0–44)
AST: 16 U/L (ref 15–41)
Albumin: 3.4 g/dL — ABNORMAL LOW (ref 3.5–5.0)
Alkaline Phosphatase: 101 U/L (ref 38–126)
Anion gap: 13 (ref 5–15)
BUN: 31 mg/dL — ABNORMAL HIGH (ref 6–20)
CO2: 21 mmol/L — ABNORMAL LOW (ref 22–32)
Calcium: 9.1 mg/dL (ref 8.9–10.3)
Chloride: 100 mmol/L (ref 98–111)
Creatinine, Ser: 3.16 mg/dL — ABNORMAL HIGH (ref 0.61–1.24)
GFR, Estimated: 23 mL/min — ABNORMAL LOW (ref >60)
Glucose, Bld: 350 mg/dL — ABNORMAL HIGH (ref 70–99)
Potassium: 4 mmol/L (ref 3.5–5.1)
Sodium: 134 mmol/L — ABNORMAL LOW (ref 135–145)
Total Bilirubin: 0.8 mg/dL (ref 0.0–1.2)
Total Protein: 7.4 g/dL (ref 6.5–8.1)

## 2024-06-13 LAB — LACTIC ACID, PLASMA
Lactic Acid, Venous: 2.2 mmol/L (ref 0.5–1.9)
Lactic Acid, Venous: 2 mmol/L (ref 0.5–1.9)
Lactic Acid, Venous: 1.4 mmol/L (ref 0.5–1.9)
Lactic Acid, Venous: 0.9 mmol/L (ref 0.5–1.9)

## 2024-06-13 LAB — CBC WITH DIFFERENTIAL/PLATELET
Abs Immature Granulocytes: 0.11 K/uL — ABNORMAL HIGH (ref 0.00–0.07)
Basophils Absolute: 0.1 K/uL (ref 0.0–0.1)
Basophils Relative: 1 %
Eosinophils Absolute: 0.1 K/uL (ref 0.0–0.5)
Eosinophils Relative: 1 %
HCT: 35.4 % — ABNORMAL LOW (ref 39.0–52.0)
Hemoglobin: 11.4 g/dL — ABNORMAL LOW (ref 13.0–17.0)
Immature Granulocytes: 1 %
Lymphocytes Relative: 12 %
Lymphs Abs: 1.7 K/uL (ref 0.7–4.0)
MCH: 25.2 pg — ABNORMAL LOW (ref 26.0–34.0)
MCHC: 32.2 g/dL (ref 30.0–36.0)
MCV: 78.1 fL — ABNORMAL LOW (ref 80.0–100.0)
Monocytes Absolute: 1 K/uL (ref 0.1–1.0)
Monocytes Relative: 7 %
Neutro Abs: 10.8 K/uL — ABNORMAL HIGH (ref 1.7–7.7)
Neutrophils Relative %: 78 %
Platelets: 257 K/uL (ref 150–400)
RBC: 4.53 MIL/uL (ref 4.22–5.81)
RDW: 15.8 % — ABNORMAL HIGH (ref 11.5–15.5)
WBC: 13.7 K/uL — ABNORMAL HIGH (ref 4.0–10.5)
nRBC: 0 % (ref 0.0–0.2)

## 2024-06-13 LAB — SEDIMENTATION RATE: Sed Rate: 67 mm/h — ABNORMAL HIGH (ref 0–15)

## 2024-06-13 LAB — BRAIN NATRIURETIC PEPTIDE: B Natriuretic Peptide: 80.3 pg/mL (ref 0.0–100.0)

## 2024-06-13 LAB — PROTIME-INR
INR: 1.2 (ref 0.8–1.2)
Prothrombin Time: 15.6 s — ABNORMAL HIGH (ref 11.4–15.2)

## 2024-06-13 LAB — APTT: aPTT: 41 s — ABNORMAL HIGH (ref 24–36)

## 2024-06-13 LAB — CBG MONITORING, ED
Glucose-Capillary: 187 mg/dL — ABNORMAL HIGH (ref 70–99)
Glucose-Capillary: 162 mg/dL — ABNORMAL HIGH (ref 70–99)

## 2024-06-13 MED ORDER — PIPERACILLIN-TAZOBACTAM 3.375 G IVPB
3.3750 g | Freq: Three times a day (TID) | INTRAVENOUS | Status: DC
  Administered 2024-06-13 – 2024-06-15 (×5): 3.375 g via INTRAVENOUS
  Filled 2024-06-13 (×5): qty 50

## 2024-06-13 MED ORDER — ATORVASTATIN CALCIUM 20 MG PO TABS
40.0000 mg | ORAL_TABLET | Freq: Every day | ORAL | Status: DC
Start: 2024-06-14 — End: 2024-06-25
  Administered 2024-06-14 – 2024-06-25 (×12): 40 mg via ORAL
  Filled 2024-06-13 (×12): qty 2

## 2024-06-13 MED ORDER — PREGABALIN 75 MG PO CAPS
75.0000 mg | ORAL_CAPSULE | Freq: Two times a day (BID) | ORAL | Status: DC
Start: 2024-06-14 — End: 2024-06-25
  Administered 2024-06-14 – 2024-06-25 (×23): 75 mg via ORAL
  Filled 2024-06-13 (×23): qty 1

## 2024-06-13 MED ORDER — INSULIN GLARGINE-YFGN 100 UNIT/ML ~~LOC~~ SOLN
40.0000 [IU] | Freq: Every day | SUBCUTANEOUS | Status: DC
  Administered 2024-06-13 – 2024-06-15 (×3): 40 [IU] via SUBCUTANEOUS
  Filled 2024-06-13 (×4): qty 0.4

## 2024-06-13 MED ORDER — INSULIN ASPART 100 UNIT/ML IJ SOLN
0.0000 [IU] | Freq: Three times a day (TID) | INTRAMUSCULAR | Status: DC
  Administered 2024-06-14 – 2024-06-15 (×6): 2 [IU] via SUBCUTANEOUS
  Administered 2024-06-16: 5 [IU] via SUBCUTANEOUS
  Administered 2024-06-16 (×2): 3 [IU] via SUBCUTANEOUS
  Administered 2024-06-17: 2 [IU] via SUBCUTANEOUS
  Administered 2024-06-17: 1 [IU] via SUBCUTANEOUS
  Administered 2024-06-18: 5 [IU] via SUBCUTANEOUS
  Administered 2024-06-18 (×2): 7 [IU] via SUBCUTANEOUS
  Administered 2024-06-19 (×2): 1 [IU] via SUBCUTANEOUS
  Filled 2024-06-13 (×16): qty 1

## 2024-06-13 MED ORDER — SODIUM CHLORIDE 0.9 % IV BOLUS (SEPSIS)
1000.0000 mL | Freq: Once | INTRAVENOUS | Status: AC
  Administered 2024-06-13: 1000 mL via INTRAVENOUS

## 2024-06-13 MED ORDER — SODIUM CHLORIDE 0.9 % IV SOLN
2.0000 g | Freq: Once | INTRAVENOUS | Status: AC
  Administered 2024-06-13: 2 g via INTRAVENOUS
  Filled 2024-06-13: qty 12.5

## 2024-06-13 MED ORDER — SODIUM CHLORIDE 0.9 % IV BOLUS
1000.0000 mL | Freq: Once | INTRAVENOUS | Status: AC
  Administered 2024-06-13: 1000 mL via INTRAVENOUS

## 2024-06-13 MED ORDER — HYDROMORPHONE HCL 2 MG PO TABS: 1.0000 mg | ORAL_TABLET | ORAL | Status: DC | PRN

## 2024-06-13 MED ORDER — INSULIN ASPART 100 UNIT/ML IJ SOLN: 3.0000 [IU] | Freq: Once | INTRAMUSCULAR | Status: DC

## 2024-06-13 MED ORDER — PANTOPRAZOLE SODIUM 40 MG PO TBEC
40.0000 mg | DELAYED_RELEASE_TABLET | Freq: Every day | ORAL | Status: DC
Start: 2024-06-14 — End: 2024-06-25
  Administered 2024-06-14 – 2024-06-25 (×12): 40 mg via ORAL
  Filled 2024-06-13 (×12): qty 1

## 2024-06-13 MED ORDER — HYDRALAZINE HCL 20 MG/ML IJ SOLN
5.0000 mg | INTRAMUSCULAR | Status: DC | PRN
  Administered 2024-06-14: 5 mg via INTRAVENOUS
  Filled 2024-06-13: qty 1

## 2024-06-13 MED ORDER — SODIUM CHLORIDE 0.9 % IV SOLN: INTRAVENOUS | Status: AC

## 2024-06-13 MED ORDER — SERTRALINE HCL 50 MG PO TABS
100.0000 mg | ORAL_TABLET | Freq: Every day | ORAL | Status: DC
Start: 2024-06-14 — End: 2024-06-25
  Administered 2024-06-14 – 2024-06-25 (×12): 100 mg via ORAL
  Filled 2024-06-13 (×12): qty 2

## 2024-06-13 MED ORDER — VITAMIN B-12 1000 MCG PO TABS
1000.0000 ug | ORAL_TABLET | Freq: Every day | ORAL | Status: DC
  Administered 2024-06-14 – 2024-06-25 (×12): 1000 ug via ORAL
  Filled 2024-06-13 (×10): qty 1
  Filled 2024-06-13: qty 2
  Filled 2024-06-13: qty 1

## 2024-06-13 MED ORDER — HYDROMORPHONE HCL 2 MG PO TABS
1.0000 mg | ORAL_TABLET | ORAL | Status: DC | PRN
  Administered 2024-06-13: 1 mg via ORAL
  Filled 2024-06-13: qty 1

## 2024-06-13 MED ORDER — INSULIN ASPART 100 UNIT/ML IJ SOLN
0.0000 [IU] | Freq: Every day | INTRAMUSCULAR | Status: DC
  Administered 2024-06-14: 2 [IU] via SUBCUTANEOUS
  Filled 2024-06-13: qty 1

## 2024-06-13 MED ORDER — SODIUM CHLORIDE 0.9 % IV BOLUS
500.0000 mL | Freq: Once | INTRAVENOUS | Status: AC
  Administered 2024-06-13: 500 mL via INTRAVENOUS

## 2024-06-13 MED ORDER — FERROUS SULFATE 325 (65 FE) MG PO TABS
325.0000 mg | ORAL_TABLET | Freq: Every day | ORAL | Status: DC
  Administered 2024-06-14 – 2024-06-24 (×11): 325 mg via ORAL
  Filled 2024-06-13 (×11): qty 1

## 2024-06-13 MED ORDER — INSULIN ASPART 100 UNIT/ML IJ SOLN: 0.0000 [IU] | Freq: Every day | INTRAMUSCULAR | Status: DC

## 2024-06-13 MED ORDER — VANCOMYCIN HCL IN DEXTROSE 1-5 GM/200ML-% IV SOLN
1000.0000 mg | Freq: Once | INTRAVENOUS | Status: AC
  Administered 2024-06-13: 1000 mg via INTRAVENOUS
  Filled 2024-06-13: qty 200

## 2024-06-13 MED ORDER — APIXABAN 5 MG PO TABS
5.0000 mg | ORAL_TABLET | Freq: Two times a day (BID) | ORAL | Status: DC
  Administered 2024-06-13 – 2024-06-14 (×3): 5 mg via ORAL
  Filled 2024-06-13 (×3): qty 1

## 2024-06-13 MED ORDER — ACETAMINOPHEN 325 MG PO TABS
650.0000 mg | ORAL_TABLET | Freq: Four times a day (QID) | ORAL | Status: DC | PRN
  Administered 2024-06-13 – 2024-06-14 (×2): 650 mg via ORAL
  Filled 2024-06-13 (×2): qty 2

## 2024-06-13 MED ORDER — ONDANSETRON HCL 4 MG/2ML IJ SOLN
4.0000 mg | Freq: Three times a day (TID) | INTRAMUSCULAR | Status: DC | PRN
  Administered 2024-06-20 – 2024-06-24 (×4): 4 mg via INTRAVENOUS
  Filled 2024-06-13 (×4): qty 2

## 2024-06-13 MED ORDER — FENTANYL CITRATE PF 50 MCG/ML IJ SOSY
25.0000 ug | PREFILLED_SYRINGE | INTRAMUSCULAR | Status: DC | PRN
  Administered 2024-06-13 – 2024-06-14 (×4): 25 ug via INTRAVENOUS
  Filled 2024-06-13 (×4): qty 1

## 2024-06-13 MED ORDER — HYDROMORPHONE HCL 1 MG/ML IJ SOLN
0.5000 mg | Freq: Once | INTRAMUSCULAR | Status: AC
  Administered 2024-06-13: 0.5 mg via INTRAVENOUS
  Filled 2024-06-13: qty 0.5

## 2024-06-13 MED ORDER — METRONIDAZOLE 500 MG/100ML IV SOLN
500.0000 mg | Freq: Once | INTRAVENOUS | Status: AC
  Administered 2024-06-13: 500 mg via INTRAVENOUS
  Filled 2024-06-13: qty 100

## 2024-06-13 MED ORDER — VITAMIN C 500 MG PO TABS
500.0000 mg | ORAL_TABLET | Freq: Every day | ORAL | Status: DC
Start: 2024-06-14 — End: 2024-06-25
  Administered 2024-06-14 – 2024-06-25 (×12): 500 mg via ORAL
  Filled 2024-06-13 (×12): qty 1

## 2024-06-13 MED ORDER — FENOFIBRATE 54 MG PO TABS
54.0000 mg | ORAL_TABLET | Freq: Every day | ORAL | Status: DC
Start: 2024-06-14 — End: 2024-06-25
  Administered 2024-06-14 – 2024-06-25 (×12): 54 mg via ORAL
  Filled 2024-06-13 (×12): qty 1

## 2024-06-13 MED ORDER — TRAZODONE HCL 100 MG PO TABS
100.0000 mg | ORAL_TABLET | Freq: Every day | ORAL | Status: DC
  Administered 2024-06-13 – 2024-06-24 (×12): 100 mg via ORAL
  Filled 2024-06-13 (×12): qty 1

## 2024-06-13 MED ORDER — VANCOMYCIN VARIABLE DOSE PER UNSTABLE RENAL FUNCTION (PHARMACIST DOSING): Status: DC

## 2024-06-13 MED ORDER — FAMOTIDINE 20 MG PO TABS
10.0000 mg | ORAL_TABLET | Freq: Two times a day (BID) | ORAL | Status: DC
  Administered 2024-06-14 – 2024-06-25 (×23): 10 mg via ORAL
  Filled 2024-06-13 (×23): qty 1

## 2024-06-13 MED ORDER — MORPHINE SULFATE (PF) 4 MG/ML IV SOLN
4.0000 mg | Freq: Once | INTRAVENOUS | Status: AC
  Administered 2024-06-13: 4 mg via INTRAVENOUS
  Filled 2024-06-13: qty 1

## 2024-06-13 MED ORDER — HYDROXYZINE HCL 25 MG PO TABS
25.0000 mg | ORAL_TABLET | Freq: Three times a day (TID) | ORAL | Status: DC | PRN
  Administered 2024-06-14 – 2024-06-18 (×8): 25 mg via ORAL
  Filled 2024-06-13 (×8): qty 1

## 2024-06-13 NOTE — ED Notes (Signed)
 Patient given dinner tray.

## 2024-06-13 NOTE — H&P (Signed)
 History and Physical    Ramir Malerba Minerd FMW:[id number] DOB: 07-Oct-1976 DOA: 06/13/2024  Referring MD/NP/PA:   PCP: Carles Seltzer, MD   Patient coming from:  The patient is coming from home.     Chief Complaint: Scrotal pain, fever  HPI: Richard Mendoza is a 48 y.o. male with medical history significant of status post left AKA, PVD on Eliquis , DM-type I on insulin  pump, hypertension, chronic pain syndrome, CKD-3a, HLD, tobacco abuse, who presents with scrotal pain and fever.  Pt state that 3 days ago he had some irritation in his perineal area that he thought may be related to an ingrown hair.  Ever since then, he has developed pain and swelling in the left scrotal area, as well as of his penile foreskin.  The pain is constant, aching, severe, nonradiating, not aggravated or alleviated by any known factors. He has difficulty retracting his foreskin with difficult urinating, but still able to void.  He developed fever and chills.  His temperature is 101.2 in the ED.  Patient does not have chest pain, cough, SOB.  No nausea, vomiting, diarrhea or abdominal pain.  Data reviewed independently and ED Course: pt was found to have WBC 13.7, worsening renal function, lactic acid 2.2 --> 2.0, temperature 101.2, blood pressure 174/114, heart rate 120-130, RR 16, oxygen saturation 100% on room air.  Patient is admitted to telemetry bed as inpatient.  ED physician discussed with Dr. Nieves of urology.  CT of abdomen/pelvis: 1. Diffuse scrotal wall thickening and inflammatory changes in the left perineum. No drainable fluid collection or abscess. No soft tissue gas. 2.  Aortic Atherosclerosis (ICD10-I70.0).  EKG: I have personally reviewed.  Sinus rhythm, QTc 482, bifascicular block   Review of Systems:   General: has fevers, chills, no body weight gain,  has fatigue HEENT: no blurry vision, hearing changes or sore throat Respiratory: no dyspnea, coughing, wheezing CV: no chest pain, no  palpitations GI: no nausea, vomiting, abdominal pain, diarrhea, constipation GU: has difficult urinating and difficult retracting penile foreskin.  Has left scrotal pain and swelling Ext: no leg edema Neuro: no unilateral weakness, numbness, or tingling, no vision change or hearing loss Skin: no rash, no skin tear. MSK: No muscle spasm, no deformity, no limitation of range of movement in spin.  S/p of left AKA Heme: No easy bruising.  Travel history: No recent long distant travel.   Allergy:  Allergies  Allergen Reactions   Oxycodone  Itching   Codeine Itching    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    Past Surgical History:  Procedure Laterality Date   AMPUTATION Left 11/07/2020   Procedure: 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;  Surgeon: Neill Boas, DPM;  Location: [facility];  Service: Podiatry;  Laterality: Left;   AMPUTATION Left 11/16/2020   Procedure: AMPUTATION BELOW KNEE;  Surgeon: Marea Selinda RAMAN, MD;  Location: [facility];  Service: Vascular;  Laterality: Left;   AMPUTATION Left 01/29/2024   Procedure: AMPUTATION, ABOVE KNEE;  Surgeon: Marea Selinda RAMAN, MD;  Location: [facility];  Service: General;  Laterality: Left;   ANTERIOR CERVICAL DECOMP/DISCECTOMY FUSION N/A 09/09/2017   Procedure: ANTERIOR CERVICAL DECOMPRESSION/DISCECTOMY FUSION CERVICAL 6- CERVICAL 7;  Surgeon: Gillie Duncans, MD;  Location:  [facility];  Service: Neurosurgery;  Laterality: N/A;  ANTERIOR CERVICAL DECOMPRESSION/DISCECTOMY FUSION CERVICAL 6- CERVICAL 7   APPENDECTOMY     I & D EXTREMITY Right 10/03/2017   Procedure: IRRIGATION AND DEBRIDEMENT RIGHT WRIST;  Surgeon: Murrell Drivers, MD;  Location: [facility];  Service: Orthopedics;  Laterality: Right;   I & D EXTREMITY  Right 11/26/2018   Procedure: IRRIGATION AND DEBRIDEMENT FASCIA ON RIGHT FOOT;  Surgeon: Ashley Soulier, DPM;  Location: [facility];  Service: Podiatry;  Laterality: Right;   INCISION AND DRAINAGE Right 03/06/2019   Procedure: INCISION AND DRAINAGE RIGHT FOOT, WITH 4th RAY AMPUTATION;  Surgeon: Ashley Soulier, DPM;  Location: [facility];  Service: Podiatry;  Laterality: Right;   INCISION AND DRAINAGE Left 11/07/2020   Procedure: INCISION AND DRAINAGE;  Surgeon: Neill Boas, DPM;  Location: [facility];  Service: Podiatry;  Laterality: Left;   INCISION AND DRAINAGE ABSCESS Left 05/02/2023   Procedure: INCISION AND DRAINAGE ABSCESS LEFT LOWER EXTREMITY;  Surgeon: Marea Selinda RAMAN, MD;  Location: [facility];  Service: Vascular;  Laterality: Left;   IRRIGATION AND DEBRIDEMENT FOOT Left 11/11/2020   Procedure: IRRIGATION AND DEBRIDEMENT FOOT;  Surgeon: Neill Boas, DPM;  Location: [facility];  Service: Podiatry;  Laterality: Left;   METATARSAL HEAD EXCISION Right 05/15/2019   Procedure: OSTECTOMY;MET HEAD 5;  Surgeon: Ashley Soulier, DPM;  Location: [facility];  Service: Podiatry;  Laterality: Right;   osteomylitis     ROTATOR CUFF REPAIR Left    SHOULDER ARTHROSCOPY WITH DEBRIDEMENT AND BICEP TENDON REPAIR Right 08/27/2023   Procedure: SHOULDER ARTHROSCOPY WITH INCISION AND DRAINAGE;  Surgeon: Tobie Priest, MD;  Location: [facility];  Service: Orthopedics;  Laterality: Right;    Social History:  reports that he quit smoking about 18 months ago. His smoking use included cigarettes. He started smoking about 22 years ago. He has a 17 pack-year smoking history. He has never used smokeless tobacco. He reports that he does not currently use alcohol. He reports that he does not use drugs.  Family History:  Family History  Problem Relation Age of Onset   Diabetes Mother    Heart disease Father    Diabetes Father    Arthritis Other    Hyperlipidemia Other    Hypertension Other    Cancer Other        breast   Mental illness  Neg Hx      Prior to Admission medications   Medication Sig Start Date End Date Taking? Authorizing Provider  acetaminophen  (TYLENOL ) 500 MG tablet Take 1 tablet (500 mg total) by mouth 3 (three) times daily. 02/05/24   Alexander, Natalie, DO  ascorbic acid  (VITAMIN C ) 500 MG tablet Take 1 tablet (500 mg total) by mouth daily. 02/06/24   Alexander, Natalie, DO  atorvastatin  (LIPITOR) 40 MG tablet Take 40 mg by mouth daily.    [provider]  benazepril  (LOTENSIN ) 40 MG tablet Take 40 mg by mouth daily. 02/09/24   [provider]  busPIRone  (BUSPAR ) 10 MG tablet Take 10 mg by mouth 2 (two) times daily.    [provider]  Continuous Glucose Sensor (DEXCOM G7 SENSOR) MISC Inject 1 Application into the skin as directed. Change sensor every 10 days as directed. 04/27/24   Therisa Benton PARAS, NP  cyanocobalamin  (VITAMIN B12) 1000 MCG tablet Take 1,000 mcg by mouth daily.    [provider]  ELIQUIS  5 MG TABS tablet Take 5 mg by mouth 2 (two) times daily. 03/12/24   [provider]  famotidine  (PEPCID ) 10 MG tablet Take 10 mg by mouth 2 (two) times daily.    [provider]  fenofibrate  54 MG tablet Take 1 tablet (54 mg total) by mouth daily. 02/06/24   Alexander, Natalie, DO  ferrous sulfate  325 (65 FE) MG tablet Take 325 mg by mouth daily with breakfast.    [provider]  furosemide  (LASIX ) 40 MG tablet Take 40 mg by mouth daily as needed. 04/23/24   [provider]  HYDROmorphone  (DILAUDID ) 2 MG tablet Take 0.5-1 tablets (1-2 mg total) by mouth every 4 (four) hours as needed for severe pain (pain score 7-10). 02/05/24   Alexander, Natalie, DO  hydrOXYzine  (ATARAX ) 25 MG tablet Take 25 mg by mouth every 8 (eight) hours. 04/24/24   [provider]  insulin  aspart (NOVOLOG ) 100 UNIT/ML injection Inject 10-16 Units into the skin 3 (three) times daily with meals. 90-150= 10 units; 151-200= 11 units; 201-250= 12 units; 251-300= 13  units; 301-350= 14 units; 351-400= 15 units; 401 or higher = 16 units 03/11/24   Therisa Benton PARAS, NP  insulin  glargine (LANTUS ) 100 UNIT/ML injection Inject 0.6 mLs (60 Units total) into the skin at bedtime. 06/12/24   Therisa Benton PARAS, NP  INSULIN  SYRINGE .5CC/29G 29G X 1/2 0.5 ML MISC Use to inject insulin  4 times daily 03/23/24   Therisa Benton PARAS, NP  methocarbamol  (ROBAXIN ) 500 MG tablet Take 1 tablet (500 mg total) by mouth every 8 (eight) hours as needed for muscle spasms. 02/05/24   Alexander, Natalie, DO  metoCLOPramide  (REGLAN ) 10 MG tablet Take 10 mg by mouth 4 (four) times daily.    [provider]  ondansetron  (ZOFRAN ) 8 MG tablet Take 8 mg by mouth every 8 (eight) hours as needed for nausea or vomiting.    [provider]  pantoprazole  (PROTONIX ) 40 MG tablet Take 40 mg by mouth daily.    [provider]  pregabalin  (LYRICA ) 75 MG capsule Take 75 mg by mouth 2 (two) times daily. 04/01/24   [provider]  sertraline  (ZOLOFT ) 100 MG tablet Take 100 mg by mouth daily.    [provider]  tiZANidine  (ZANAFLEX ) 4 MG tablet Take 4 mg by mouth every 6 (six) hours as needed for muscle spasms.    [provider]  traZODone  (DESYREL ) 100 MG tablet Take 100 mg by mouth at bedtime. 08/31/23   [provider]  Vitamin D , Ergocalciferol , (DRISDOL ) 1.25 MG (50000 UNIT) CAPS capsule Take 1 capsule (50,000 Units total) by mouth every 7 (seven) days. 02/11/24   Marsa Edelman, DO    Physical Exam: Vitals:   06/13/24 2017 06/13/24 2205 06/14/24 0014 06/14/24 0015  BP:  (!) 161/89 (!) 174/110 (!) 174/110  Pulse:  (!) 113    Resp:  (!) 23    Temp: (!) 100.8 F (38.2 C) 99.4 F (37.4 C) (!) 100.4 F (38 C)   TempSrc: Oral Oral Oral   SpO2:  98%    Weight:      Height:       General: Not in acute distress HEENT:       Eyes: PERRL, EOMI, no jaundice       ENT: No discharge from the ears and nose, no pharynx injection, no  tonsillar enlargement.        Neck: No JVD, no bruit, no mass felt. Heme: No neck lymph node enlargement. Cardiac: S1/S2, RRR, No murmurs, No gallops or rubs. Respiratory: No rales, wheezing,  rhonchi or rubs. GI: Soft, nondistended, nontender, no rebound pain, no organomegaly, BS present. GU: has erythema, tenderness, warmth, swelling to left scrotum and penile foreskin.  No fluctuance.       Ext: No pitting leg edema bilaterally. S/p of left AKA Musculoskeletal: No joint deformities, No joint redness or warmth, no limitation of ROM in spin. Skin: No rashes.  Neuro: Alert, oriented X3, cranial nerves II-XII grossly intact, moves all extremities normally. Psych: Patient is not psychotic, no suicidal or hemocidal ideation.  Labs on Admission: I have personally reviewed following labs and imaging studies  CBC: Recent Labs  Lab 06/13/24 1532  WBC 13.7*  NEUTROABS 10.8*  HGB 11.4*  HCT 35.4*  MCV 78.1*  PLT 257   Basic Metabolic Panel: Recent Labs  Lab 06/13/24 1532  NA 134*  K 4.0  CL 100  CO2 21*  GLUCOSE 350*  BUN 31*  CREATININE 3.16*  CALCIUM  9.1   GFR: Estimated Creatinine Clearance: 32.6 mL/min (A) (by C-G formula based on SCr of 3.16 mg/dL (H)). Liver Function Tests: Recent Labs  Lab 06/13/24 1532  AST 16  ALT 13  ALKPHOS 101  BILITOT 0.8  PROT 7.4  ALBUMIN 3.4*   No results for input(s): LIPASE, AMYLASE in the last 168 hours. No results for input(s): AMMONIA in the last 168 hours. Coagulation Profile: Recent Labs  Lab 06/13/24 1936  INR 1.2   Cardiac Enzymes: No results for input(s): CKTOTAL, CKMB, CKMBINDEX, TROPONINI in the last 168 hours. BNP (last 3 results) No results for input(s): PROBNP in the last 8760 hours. HbA1C: No results for input(s): HGBA1C in the last 72 hours. CBG: Recent Labs  Lab 06/13/24 1944 06/13/24 2229  GLUCAP 187* 162*   Lipid Profile: No results for input(s): CHOL, HDL, LDLCALC,  TRIG, CHOLHDL, LDLDIRECT in the last 72 hours. Thyroid Function Tests: No results for input(s): TSH, T4TOTAL, FREET4, T3FREE, THYROIDAB in the last 72 hours. Anemia Panel: No results for input(s): VITAMINB12, FOLATE, FERRITIN, TIBC, IRON , RETICCTPCT in the last 72 hours. Urine analysis:    Component Value Date/Time   COLORURINE STRAW (A) 06/13/2024 1637   APPEARANCEUR CLEAR (A) 06/13/2024 1637   LABSPEC 1.011 06/13/2024 1637   PHURINE 5.0 06/13/2024 1637   GLUCOSEU >=500 (A) 06/13/2024 1637   HGBUR NEGATIVE 06/13/2024 1637   BILIRUBINUR NEGATIVE 06/13/2024 1637   KETONESUR NEGATIVE 06/13/2024 1637   PROTEINUR 100 (A) 06/13/2024 1637   UROBILINOGEN 0.2 07/31/2011 2217   NITRITE NEGATIVE 06/13/2024 1637   LEUKOCYTESUR NEGATIVE 06/13/2024 1637   Sepsis Labs: @LABRCNTIP (procalcitonin:4,lacticidven:4) )No results found for this or any previous visit (from the past 240 hours).   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  Patient meets criteria for sepsis with WBC 13.7, temperature 101.2, heart rate up to 130s.  Lactic acid 2.2 -> 2.0.  ED patient discussed with Dr. Nieves of urology. Does recommend reconsultation if patient has clinical change.   -  will admit to tele bed as inpatient - Empiric antimicrobial treatment with vancomycin  and zosyn  (patient received 1 dose of cefepime  and Flagyl  in  ED) - PRN Zofran  for nausea, and Tylenol , fentanyl , oral Dilaudid  for pain - Blood cultures x 2  - ESR and CRP - Tractic acid levels per sepsis protocol. - IVF: total of 2.5 L of NS bolus, followed by 75 cc/h  (patient has congestive heart failure, limiting aggressive IV fluids treatment).  Essential hypertension - IV hydralazine  as needed - Hold Lasix  and losartan due to sepsis and high risk of developing hypotension  PAD (peripheral artery disease) (HCC) - Continue Eliquis , Lipitor  Type 1 diabetes mellitus with diabetic neuropathy [facility]): Recent A1c 9.2, poorly controlled.  Patient taking Lantus  50 units daily and NovoLog  -Glargine insulin  40 units daily - SSI  Chronic diastolic CHF (congestive heart failure) (HCC): 2D echo on 08/28/2023 showed EF of 55-60% with grade 1 diastolic dysfunction.  Patient does not have leg edema.  BNP normal 80.3.  CHF is compensated. - Hold Lasix  due to sepsis  HLD (hyperlipidemia) -Lipitor, fenofibrate   Acute renal failure superimposed on stage 3b chronic kidney disease (HCC): Baseline creatinine 2.18 on 05/26/2024.  His creatinine is 3.16, GFR 23, BUN 31 today. -Hold Lasix , Lotensin  - IV fluid as above  Iron  deficiency anemia, unspecified: Hemoglobin stable, 11.4 today. - Continue iron  supplement  Chronic pain syndrome -Continue home oral Dilaudid , lyrica   Depression -Continue home medications  Overweight (BMI 25.0-29.9): Body weight 92 kg, BMI 29.10 - Encourage losing weight - Exercise and healthy diet     DVT ppx: on Eliquis   Code Status: Full code   Family Communication:     not done, no family member is at bed side.  Disposition Plan:  Anticipate discharge back to previous environment  Consults called:  ED physician discussed with Dr. Nieves of urology.  Admission status and Level of care: Progressive as inpt        Dispo: The patient is from: Home              Anticipated d/c is to: Home              Anticipated d/c  date is: 2 days              Patient currently is not medically stable to d/c.    Severity of Illness:  The appropriate patient status for this patient is INPATIENT. Inpatient status is judged to be reasonable and necessary in order to provide the required intensity of service to ensure the patient's safety. The patient's presenting symptoms, physical exam findings, and initial radiographic and laboratory data in the context of their chronic comorbidities is felt to place them at high risk for further clinical deterioration. Furthermore, it is not anticipated that the patient will be medically stable for discharge from the hospital within 2 midnights of admission.   * I certify that at the point of admission it is my clinical judgment that the patient will require inpatient hospital care spanning beyond 2 midnights from the point of admission due to high intensity of service, high risk for further deterioration and high frequency of surveillance required.*       Date of Service 06/14/2024    [facility]   If 7PM-7AM, please contact night-coverage [email or web address] 06/14/2024, 12:30 AM

## 2024-06-13 NOTE — Consult Note (Signed)
 CODE SEPSIS - PHARMACY COMMUNICATION  **Broad Spectrum Antibiotics should be administered within 1 hour of Sepsis diagnosis**  Time Code Sepsis Called/Page Received: 1629  Antibiotics Ordered: Cefepime , Vancomycin , Metronidazole    Time of 1st antibiotic administration: 1641  Additional action taken by pharmacy: N/A  If necessary, Name of Provider/Nurse Contacted: N/A  Evonnie Nieves ,PharmD Clinical Pharmacist  06/13/2024  4:29 PM

## 2024-06-13 NOTE — ED Triage Notes (Addendum)
 Pt to ed from home via POV for abscess to left side of his testicle. Pt advised his penis is swollen and is unable to pull the skin back to urinate. Pt has HX of infection/sepsis. Pt is caox4, in no acute distress and is a BTK left sided amputee pt. Pt thought it was first an ingrown hair and then it has gotten worse.

## 2024-06-13 NOTE — ED Provider Notes (Signed)
 [facility] Provider Note    Event Date/Time   First MD Initiated Contact with Patient 06/13/24 1615     (approximate)   History   Abscess   HPI  Richard Mendoza is a 48 year old male with history of DM, HTN, CKD, left BKA presenting to the emergency department for evaluation of scrotal pain.  Patient notes that 3 days ago he had some irritation in his perineal area that he thought may be related to an ingrown hair.  However since that time he has had significant increase swelling into his left scrotal area as well as swelling of his penile foreskin.  No discomfort over his right scrotal/testicular area.  Is able to void though he does have difficulty retracting his foreskin.  Reports history of prior MRSA infections.       Physical Exam   Triage Vital Signs: ED Triage Vitals  Encounter Vitals Group     BP 06/13/24 1528 (!) 174/114     Girls Systolic BP Percentile --      Girls Diastolic BP Percentile --      Boys Systolic BP Percentile --      Boys Diastolic BP Percentile --      Pulse Rate 06/13/24 1528 (!) 120     Resp 06/13/24 1528 16     Temp 06/13/24 1528 98.4 F (36.9 C)     Temp Source 06/13/24 1528 Oral     SpO2 06/13/24 1528 100 %     Weight 06/13/24 1529 202 lb 13.2 oz (92 kg)     Height 06/13/24 1529 5' 10 (1.778 m)     Head Circumference --      Peak Flow --      Pain Score 06/13/24 1529 9     Pain Loc --      Pain Education --      Exclude from Growth Chart --     Most recent vital signs: Vitals:   06/13/24 1845 06/13/24 1916  BP: (!) 178/98   Pulse: (!) 127   Resp: 19   Temp:  (!) 101.2 F (38.4 C)  SpO2: 98%      General: Awake, interactive  CV:  Tachycardic with regular rhythm, good peripheral perfusion Resp:  Unlabored respirations, lungs clear to auscultation Abd:  Nondistended, soft, nontender,  GU:  There is significant erythema and swelling of the left scrotum scrotum into the penile foreskin.  There  is an area of along the posterior aspect of the left scrotum without obvious fluctuance though exam is somewhat pain limited.  The area is tender to palpation.  See images below. Neuro:  Symmetric facial movement, fluid speech        ED Results / Procedures / Treatments   Labs (all labs ordered are listed, but only abnormal results are displayed) Labs Reviewed  LACTIC ACID, PLASMA - Abnormal; Notable for the following components:      Result Value   Lactic Acid, Venous 2.2 (*)    All other components within normal limits  LACTIC ACID, PLASMA - Abnormal; Notable for the following components:   Lactic Acid, Venous 2.0 (*)    All other components within normal limits  COMPREHENSIVE METABOLIC PANEL WITH GFR - Abnormal; Notable for the following components:   Sodium 134 (*)    CO2 21 (*)    Glucose, Bld 350 (*)    BUN 31 (*)    Creatinine, Ser 3.16 (*)    Albumin 3.4 (*)  GFR, Estimated 23 (*)    All other components within normal limits  CBC WITH DIFFERENTIAL/PLATELET - Abnormal; Notable for the following components:   WBC 13.7 (*)    Hemoglobin 11.4 (*)    HCT 35.4 (*)    MCV 78.1 (*)    MCH 25.2 (*)    RDW 15.8 (*)    Neutro Abs 10.8 (*)    Abs Immature Granulocytes 0.11 (*)    All other components within normal limits  URINALYSIS, W/ REFLEX TO CULTURE (INFECTION SUSPECTED) - Abnormal; Notable for the following components:   Color, Urine STRAW (*)    APPearance CLEAR (*)    Glucose, UA >=500 (*)    Protein, ur 100 (*)    All other components within normal limits  CULTURE, BLOOD (ROUTINE X 2)  CULTURE, BLOOD (ROUTINE X 2)  BRAIN NATRIURETIC PEPTIDE  C-REACTIVE PROTEIN  SEDIMENTATION RATE  LACTIC ACID, PLASMA  LACTIC ACID, PLASMA  PROTIME-INR  APTT  HIV ANTIBODY (ROUTINE TESTING W REFLEX)  BASIC METABOLIC PANEL WITH GFR  CBC     EKG EKG independently reviewed and interpreted by myself demonstrates:    RADIOLOGY Imaging independently reviewed and  interpreted by myself demonstrates:  CT pelvis with diffuse scrotal wall thickening and inflammatory changes without drainable fluid collection or soft tissue gas  Formal Radiology Read:  CT PELVIS WO CONTRAST Result Date: 06/13/2024 CLINICAL DATA:  Soft tissue infection left posterior scrotum or perineum. EXAM: CT PELVIS WITHOUT CONTRAST TECHNIQUE: Multidetector CT imaging of the pelvis was performed following the standard protocol without intravenous contrast. RADIATION DOSE REDUCTION: This exam was performed according to the departmental dose-optimization program which includes automated exposure control, adjustment of the mA and/or kV according to patient size and/or use of iterative reconstruction technique. COMPARISON:  CT abdomen pelvis dated 05/23/2024. FINDINGS: Evaluation of this exam is limited in the absence of intravenous contrast. Urinary Tract:  No abnormality visualized. Bowel:  Unremarkable visualized pelvic bowel loops. Vascular/Lymphatic: Mild aortoiliac atherosclerotic disease. The IVC is unremarkable. Reproductive: The prostate and seminal vesicles are grossly unremarkable. Other: There is diffuse scrotal wall thickening. There is inflammatory changes and thickening of the subcutaneous fat in the left perineum and along the left perineal fold. No drainable fluid collection or abscess. No soft tissue gas. Musculoskeletal: No suspicious bone lesions identified. IMPRESSION: 1. Diffuse scrotal wall thickening and inflammatory changes in the left perineum. No drainable fluid collection or abscess. No soft tissue gas. 2.  Aortic Atherosclerosis (ICD10-I70.0). Electronically Signed   By: Vanetta Chou M.D.   On: 06/13/2024 17:08    PROCEDURES:  Critical Care performed: Yes, see critical care procedure note(s)  CRITICAL CARE Performed by: Nilsa Dade   Total critical care time: 32 minutes  Critical care time was exclusive of separately billable procedures and treating other  patients.  Critical care was necessary to treat or prevent imminent or life-threatening deterioration.  Critical care was time spent personally by me on the following activities: development of treatment plan with patient and/or surrogate as well as nursing, discussions with consultants, evaluation of patient's response to treatment, examination of patient, obtaining history from patient or surrogate, ordering and performing treatments and interventions, ordering and review of laboratory studies, ordering and review of radiographic studies, pulse oximetry and re-evaluation of patient's condition.   Procedures   MEDICATIONS ORDERED IN ED: Medications  vancomycin  (VANCOCIN ) IVPB 1000 mg/200 mL premix (1,000 mg Intravenous New Bag/Given 06/13/24 1849)  acetaminophen  (TYLENOL ) tablet 650 mg (has no administration  in time range)  ondansetron  (ZOFRAN ) injection 4 mg (has no administration in time range)  hydrALAZINE  (APRESOLINE ) injection 5 mg (has no administration in time range)  HYDROmorphone  (DILAUDID ) tablet 1 mg (has no administration in time range)  fentaNYL  (SUBLIMAZE ) injection 25 mcg (has no administration in time range)  0.9 %  sodium chloride  infusion (has no administration in time range)  piperacillin -tazobactam (ZOSYN ) IVPB 3.375 g (has no administration in time range)  vancomycin  (VANCOCIN ) IVPB 1000 mg/200 mL premix (has no administration in time range)  vancomycin  variable dose per unstable renal function (pharmacist dosing) (has no administration in time range)  insulin  aspart (novoLOG ) injection 0-9 Units (has no administration in time range)  insulin  aspart (novoLOG ) injection 0-5 Units ( Subcutaneous Not Given 06/13/24 1944)  sodium chloride  0.9 % bolus 1,000 mL (0 mLs Intravenous Stopped 06/13/24 1845)  ceFEPIme  (MAXIPIME ) 2 g in sodium chloride  0.9 % 100 mL IVPB (0 g Intravenous Stopped 06/13/24 1724)  metroNIDAZOLE  (FLAGYL ) IVPB 500 mg (0 mg Intravenous Stopped 06/13/24 1845)   morphine  (PF) 4 MG/ML injection 4 mg (4 mg Intravenous Given 06/13/24 1724)  HYDROmorphone  (DILAUDID ) injection 0.5 mg (0.5 mg Intravenous Given 06/13/24 1846)     IMPRESSION / MDM / ASSESSMENT AND PLAN / ED COURSE  I reviewed the triage vital signs and the nursing notes.  Differential diagnosis includes, but is not limited to, scrotal abscess, perineal abscess, Fournier's gangrene, suspicion testicular torsion, epididymitis based on clinical history  Patient's presentation is most consistent with acute presentation with potential threat to life or bodily function.  48 year old male presenting to the emergency department for evaluation of left scrotal and perineal swelling.  Tachycardic on presentation.  Labs sent from triage notable for leukocytosis with WC of 13.7.  Stable anemia.  CMP with AKI on CKD with creatinine of 3.16.  Lactate elevated at 2.2.  Sepsis orders initiated with empiric cefepime , vancomycin , Flagyl  given history of prior MRSA infection.  Ordered for IV fluids.  Will obtain CT of the pelvis to further evaluate.   CT pelvis demonstrates extensive scrotal wall thickening and inflammatory changes without abscess or focal consolidation.  No air.  Will discuss with urology as the patient is high risk.   Clinical Course as of 06/13/24 1946  Sat Jun 13, 2024  8164 Case reviewed with Dr. Nieves.  He agrees with plan for IV antibiotics and admission, but no indication for surgical intervention at this time.  Does recommend reconsultation if patient has clinical change.  Will reach out to hospitalist team. [NR]  1906 Case reviewed with Dr. Hilma.  He will evaluate for anticipated admission. [NR]    Clinical Course User Index [NR] Levander Slate, MD     FINAL CLINICAL IMPRESSION(S) / ED DIAGNOSES   Final diagnoses:  Cellulitis of perineum     Rx / DC Orders   ED Discharge Orders     None        Note:  This document was prepared using Dragon voice recognition software  and may include unintentional dictation errors.   Levander Slate, MD 06/13/24 [id number]

## 2024-06-13 NOTE — ED Notes (Signed)
First set of cultures sent down 

## 2024-06-13 NOTE — Progress Notes (Signed)
 Pharmacy Antibiotic Note  Richard Mendoza is a 48 y.o. male admitted on 06/13/2024 with scrotal abscess. Patient presenting with increase pain and swelling in perineal area. PMH significant for DM, HTN, CKD, left BKA. In ED, patient is febrile (Tmax 101.2) with WBC 13.7. Pharmacy has been consulted for vancomycin  and Zosyn  dosing.   Scr 3.16 (BL ~2.3-2.7)  Plan: Start Zosyn  3.375 g IV every 8 hours Vancomycin  1000 mg IV given in ED - will give an additional 1000 mg IV x1 (for total fo 2000 mg load) F/u Scr in AM to assess the need to check 24-hour random level or schedule further vancomycin  doses Monitor renal function, clinical status, culture data, and LOT  Height: 5' 10 (177.8 cm) Weight: 92 kg (202 lb 13.2 oz) IBW/kg (Calculated) : 73  Temp (24hrs), Avg:99.8 F (37.7 C), Min:98.4 F (36.9 C), Max:101.2 F (38.4 C)  Recent Labs  Lab 06/13/24 1532 06/13/24 1729  WBC 13.7*  --   CREATININE 3.16*  --   LATICACIDVEN 2.2* 2.0*    Estimated Creatinine Clearance: 32.6 mL/min (A) (by C-G formula based on SCr of 3.16 mg/dL (H)).    Allergies  Allergen Reactions   Oxycodone  Itching   Codeine Itching    Antimicrobials this admission: cefepime  7/26 x1 metronidazole  7/26 x1 Vancomycin  7/26 >> Zosyn  7/26 >>  Dose adjustments this admission: N/A  Microbiology results: 7/26 BCx: pending  Thank you for involving pharmacy in this patient's care.   Damien Napoleon, PharmD Clinical Pharmacist 06/13/2024 7:25 PM

## 2024-06-13 NOTE — Progress Notes (Signed)
 Pt being followed by [facility] for Sepsis protocol.

## 2024-06-13 NOTE — ED Notes (Signed)
 Date and time results received: 06/13/24 1615  Test: lactic acid Critical Value: 2.2  Name of Provider Notified: MD Ray

## 2024-06-14 ENCOUNTER — Encounter: Payer: Self-pay | Admitting: Infectious Diseases

## 2024-06-14 DIAGNOSIS — N492 Inflammatory disorders of scrotum: Secondary | ICD-10-CM | POA: Diagnosis not present

## 2024-06-14 LAB — BASIC METABOLIC PANEL WITH GFR
Anion gap: 5 (ref 5–15)
BUN: 29 mg/dL — ABNORMAL HIGH (ref 6–20)
CO2: 18 mmol/L — ABNORMAL LOW (ref 22–32)
Calcium: 8 mg/dL — ABNORMAL LOW (ref 8.9–10.3)
Chloride: 110 mmol/L (ref 98–111)
Creatinine, Ser: 3.02 mg/dL — ABNORMAL HIGH (ref 0.61–1.24)
GFR, Estimated: 25 mL/min — ABNORMAL LOW (ref >60)
Glucose, Bld: 196 mg/dL — ABNORMAL HIGH (ref 70–99)
Potassium: 3.9 mmol/L (ref 3.5–5.1)
Sodium: 133 mmol/L — ABNORMAL LOW (ref 135–145)

## 2024-06-14 LAB — CBC
HCT: 26.8 % — ABNORMAL LOW (ref 39.0–52.0)
Hemoglobin: 8.8 g/dL — ABNORMAL LOW (ref 13.0–17.0)
MCH: 25.6 pg — ABNORMAL LOW (ref 26.0–34.0)
MCHC: 32.8 g/dL (ref 30.0–36.0)
MCV: 77.9 fL — ABNORMAL LOW (ref 80.0–100.0)
Platelets: 188 K/uL (ref 150–400)
RBC: 3.44 MIL/uL — ABNORMAL LOW (ref 4.22–5.81)
RDW: 15.8 % — ABNORMAL HIGH (ref 11.5–15.5)
WBC: 15.7 K/uL — ABNORMAL HIGH (ref 4.0–10.5)
nRBC: 0 % (ref 0.0–0.2)

## 2024-06-14 LAB — GLUCOSE, CAPILLARY
Glucose-Capillary: 185 mg/dL — ABNORMAL HIGH (ref 70–99)
Glucose-Capillary: 223 mg/dL — ABNORMAL HIGH (ref 70–99)

## 2024-06-14 LAB — CBG MONITORING, ED
Glucose-Capillary: 162 mg/dL — ABNORMAL HIGH (ref 70–99)
Glucose-Capillary: 189 mg/dL — ABNORMAL HIGH (ref 70–99)

## 2024-06-14 LAB — HIV ANTIBODY (ROUTINE TESTING W REFLEX): HIV Screen 4th Generation wRfx: NONREACTIVE

## 2024-06-14 LAB — C-REACTIVE PROTEIN: CRP: 11.4 mg/dL — ABNORMAL HIGH (ref <1.0)

## 2024-06-14 MED ORDER — HYDROMORPHONE HCL 1 MG/ML IJ SOLN
1.0000 mg | INTRAMUSCULAR | Status: DC | PRN
  Administered 2024-06-14 – 2024-06-16 (×13): 1 mg via INTRAVENOUS
  Filled 2024-06-14 (×13): qty 1

## 2024-06-14 MED ORDER — CHLORHEXIDINE GLUCONATE CLOTH 2 % EX PADS
6.0000 | MEDICATED_PAD | Freq: Every day | CUTANEOUS | Status: DC
  Administered 2024-06-14 – 2024-06-25 (×12): 6 via TOPICAL

## 2024-06-14 MED ORDER — VANCOMYCIN HCL IN DEXTROSE 1-5 GM/200ML-% IV SOLN
1000.0000 mg | INTRAVENOUS | Status: DC
  Administered 2024-06-14: 1000 mg via INTRAVENOUS
  Filled 2024-06-14: qty 200

## 2024-06-14 MED ORDER — IBUPROFEN 400 MG PO TABS
400.0000 mg | ORAL_TABLET | Freq: Once | ORAL | Status: AC
  Administered 2024-06-14: 400 mg via ORAL
  Filled 2024-06-14: qty 1

## 2024-06-14 NOTE — Progress Notes (Signed)
 Progress Note   Patient: Richard Mendoza FMW:[id number] DOB: [birth year] DOA: 06/13/2024     1 DOS: the patient was seen and examined on 06/14/2024   Brief hospital course:  Moriah Loughry Jarrells is a 48 y.o. male with medical history significant of status post left AKA, PVD on Eliquis , DM-type I on insulin  pump, hypertension, chronic pain syndrome, CKD-3a, HLD, tobacco abuse, who presents with scrotal pain and fever.  Pt state that 3 days ago he had some irritation in his perineal area that he thought may be related to an ingrown hair.  Ever since then, he has developed pain and swelling in the left scrotal area, as well as of his penile foreskin.  The pain is constant, aching, severe, nonradiating, not aggravated or alleviated by any known factors. He has difficulty retracting his foreskin with difficult urinating, but still able to void.  He developed fever and chills.  His temperature is 101.2 in the ED.  Patient does not have chest pain, cough, SOB.  No nausea, vomiting, diarrhea or abdominal pain.   Data reviewed independently and ED Course: pt was found to have WBC 13.7, worsening renal function, lactic acid 2.2 --> 2.0, temperature 101.2, blood pressure 174/114, heart rate 120-130, RR 16, oxygen saturation 100% on room air.  Patient is admitted to telemetry bed as inpatient.  ED physician discussed with Dr. Nieves of urology.   CT of abdomen/pelvis: 1. Diffuse scrotal wall thickening and inflammatory changes in the left perineum. No drainable fluid collection or abscess. No soft tissue gas. 2.  Aortic Atherosclerosis (ICD10-I70.0).   EKG: I have personally reviewed.  Sinus rhythm, QTc 482, bifascicular block      Assessment and Plan:  Sepsis due to cellulitis of scrotum:  No abscess formation by CT scan.   Patient met criteria for sepsis with WBC 13.7, temperature 101.2, heart rate up to 130s.  Lactic acid 2.2 -> 2.0.  ED patient discussed with Dr. Nieves of urology. Does  recommend reconsultation if patient has clinical change.  Continue empiric antibiotic therapy with vancomycin  and zosyn  (patient received 1 dose of cefepime  and Flagyl  in ED) Follow-up results of blood cultures.   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  Patient taking Lantus  50 units daily and NovoLog  Glargine insulin  40 units daily SSI   Chronic diastolic CHF (congestive heart failure) (HCC):  2D Echo on 08/28/2023 showed EF of 55-60% with grade 1 diastolic dysfunction. CHF is compensated. - Hold Lasix  due to sepsis    HLD (hyperlipidemia) -Lipitor, fenofibrate    Acute renal failure superimposed on stage 3b chronic kidney disease (HCC): Baseline creatinine 2.18 on 05/26/2024.   His creatinine on admission was 3.16, GFR 23, BUN 31 Continue to hold Lasix , Lotensin  IV fluid as above    Iron  deficiency anemia, unspecified: Hemoglobin stable, 11.4 today. - Continue iron  supplement   Chronic pain syndrome -Continue home oral Dilaudid , lyrica    Depression -Continue home medications   Overweight (BMI 25.0-29.9): Body weight 92 kg, BMI 29.10 - Encourage losing weight - Exercise and healthy diet          Subjective: Continues to complain of scrotal pain which he rates 8/10 in intensity at its worst.  Physical Exam: Vitals:   06/14/24 1230 06/14/24 1231 06/14/24 1330  06/14/24 1400  BP: 111/72  111/66 110/62  Pulse: 86  88 84  Resp: (!) 23  (!) 24 (!) 24  Temp:  98.6 F (37 C)  98.6 F (37 C)  TempSrc:  Oral    SpO2: 96%  94% 96%  Weight:      Height:       General: Not in acute distress HEENT:       Eyes: PERRL, EOMI, no jaundice       ENT: No discharge from the ears and nose, no pharynx injection, no tonsillar enlargement.         Neck: No JVD, no bruit, no mass felt. Heme: No neck lymph node enlargement. Cardiac: S1/S2, RRR, No murmurs, No gallops or rubs. Respiratory: No rales, wheezing, rhonchi or rubs. GI: Soft, nondistended, nontender, no rebound pain, no organomegaly, BS present. GU: has erythema, tenderness, warmth, swelling to left scrotum and penile foreskin.  No fluctuance.           Ext: No pitting leg edema bilaterally. S/p of left AKA Musculoskeletal: No joint deformities, No joint redness or warmth, no limitation of ROM in spin. Skin: No rashes.  Neuro: Alert, oriented X3, cranial nerves II-XII grossly intact, moves all extremities normally. Psych: Patient is not psychotic, no suicidal or hemocidal ideation.   Data Reviewed: Labs reviewed.  Sodium 133, bicarb 18, creatinine 3.02, white count 15.7 Labs reviewed  Family Communication: Plan of care discussed with patient in detail.  He verbalizes understanding and agrees with the plan.  Disposition: Status is: Inpatient Remains inpatient appropriate because: On IV antibiotics  Planned Discharge Destination: Home    Time spent: 40 minutes  Author: Aimee Somerset, MD 06/14/2024 2:37 PM  For on call review [email or web address].

## 2024-06-14 NOTE — ED Notes (Signed)
 Hospitalist at bedside

## 2024-06-14 NOTE — ED Notes (Signed)
 MD Hilma made aware of patient's continued tachycardia, increased BP and increased temp. See new orders.

## 2024-06-14 NOTE — Progress Notes (Signed)
 Pharmacy Antibiotic Note  Richard Mendoza is a 48 y.o. male admitted on 06/13/2024 with scrotal abscess. Patient presenting with increase pain and swelling in perineal area. PMH significant for DM, HTN, CKD, left BKA. In ED, patient is febrile (Tmax 101.2) with WBC 13.7. Pharmacy has been consulted for vancomycin  and Zosyn  dosing.   Scr 3.16 >> 3.02 (BL ~2.3-2.7)  Plan: -Continue Zosyn  3.375 g IV every 8 hours -Will order Vancomycin  1000 mg IV Q 24 hrs. (Received a loading dose in ED 7/26) Goal AUC 400-550. Expected AUC: 502 Cmin 14.7 SCr used: 3.02    Monitor renal function, clinical status, culture data, and LOT  Height: 5' 10 (177.8 cm) Weight: 92 kg (202 lb 13.2 oz) IBW/kg (Calculated) : 73  Temp (24hrs), Avg:99.8 F (37.7 C), Min:98.4 F (36.9 C), Max:101.2 F (38.4 C)  Recent Labs  Lab 06/13/24 1532 06/13/24 1729 06/13/24 2018 06/13/24 2223 06/14/24 0447  WBC 13.7*  --   --   --  15.7*  CREATININE 3.16*  --   --   --  3.02*  LATICACIDVEN 2.2* 2.0* 1.4 0.9  --     Estimated Creatinine Clearance: 34.1 mL/min (A) (by C-G formula based on SCr of 3.02 mg/dL (H)).    Allergies  Allergen Reactions   Oxycodone  Itching   Codeine Itching    Antimicrobials this admission: cefepime  7/26 x1 metronidazole  7/26 x1 Vancomycin  7/26 >> Zosyn  7/26 >>  Dose adjustments this admission: N/A  Microbiology results: 7/26 BCx: pending  Thank you for involving pharmacy in this patient's care.   Allean Haas PharmD Clinical Pharmacist 06/14/2024

## 2024-06-15 ENCOUNTER — Ambulatory Visit (INDEPENDENT_AMBULATORY_CARE_PROVIDER_SITE_OTHER): Admitting: Licensed Clinical Social Worker

## 2024-06-15 ENCOUNTER — Encounter (HOSPITAL_COMMUNITY): Payer: Self-pay

## 2024-06-15 DIAGNOSIS — N492 Inflammatory disorders of scrotum: Secondary | ICD-10-CM

## 2024-06-15 DIAGNOSIS — L03315 Cellulitis of perineum: Principal | ICD-10-CM

## 2024-06-15 DIAGNOSIS — Z8614 Personal history of Methicillin resistant Staphylococcus aureus infection: Secondary | ICD-10-CM

## 2024-06-15 DIAGNOSIS — F411 Generalized anxiety disorder: Secondary | ICD-10-CM

## 2024-06-15 DIAGNOSIS — F332 Major depressive disorder, recurrent severe without psychotic features: Secondary | ICD-10-CM

## 2024-06-15 LAB — CBC
HCT: 24.8 % — ABNORMAL LOW (ref 39.0–52.0)
Hemoglobin: 7.9 g/dL — ABNORMAL LOW (ref 13.0–17.0)
MCH: 25.2 pg — ABNORMAL LOW (ref 26.0–34.0)
MCHC: 31.9 g/dL (ref 30.0–36.0)
MCV: 79 fL — ABNORMAL LOW (ref 80.0–100.0)
Platelets: 157 K/uL (ref 150–400)
RBC: 3.14 MIL/uL — ABNORMAL LOW (ref 4.22–5.81)
RDW: 16.4 % — ABNORMAL HIGH (ref 11.5–15.5)
WBC: 13.2 K/uL — ABNORMAL HIGH (ref 4.0–10.5)
nRBC: 0 % (ref 0.0–0.2)

## 2024-06-15 LAB — GLUCOSE, CAPILLARY
Glucose-Capillary: 167 mg/dL — ABNORMAL HIGH (ref 70–99)
Glucose-Capillary: 191 mg/dL — ABNORMAL HIGH (ref 70–99)
Glucose-Capillary: 176 mg/dL — ABNORMAL HIGH (ref 70–99)
Glucose-Capillary: 301 mg/dL — ABNORMAL HIGH (ref 70–99)

## 2024-06-15 LAB — BASIC METABOLIC PANEL WITH GFR
Anion gap: 8 (ref 5–15)
BUN: 38 mg/dL — ABNORMAL HIGH (ref 6–20)
CO2: 19 mmol/L — ABNORMAL LOW (ref 22–32)
Calcium: 8.4 mg/dL — ABNORMAL LOW (ref 8.9–10.3)
Chloride: 108 mmol/L (ref 98–111)
Creatinine, Ser: 3.76 mg/dL — ABNORMAL HIGH (ref 0.61–1.24)
GFR, Estimated: 19 mL/min — ABNORMAL LOW (ref >60)
Glucose, Bld: 160 mg/dL — ABNORMAL HIGH (ref 70–99)
Potassium: 4.3 mmol/L (ref 3.5–5.1)
Sodium: 135 mmol/L (ref 135–145)

## 2024-06-15 LAB — IRON AND TIBC
Iron: 8 ug/dL — ABNORMAL LOW (ref 45–182)
Saturation Ratios: 4 % — ABNORMAL LOW (ref 17.9–39.5)
TIBC: 192 ug/dL — ABNORMAL LOW (ref 250–450)
UIBC: 184 ug/dL

## 2024-06-15 MED ORDER — GLUCERNA SHAKE PO LIQD
237.0000 mL | Freq: Three times a day (TID) | ORAL | Status: DC
  Administered 2024-06-15 – 2024-06-24 (×18): 237 mL via ORAL

## 2024-06-15 MED ORDER — LINEZOLID 600 MG/300ML IV SOLN
600.0000 mg | Freq: Two times a day (BID) | INTRAVENOUS | Status: DC
  Administered 2024-06-15 – 2024-06-20 (×11): 600 mg via INTRAVENOUS
  Filled 2024-06-15 (×12): qty 300

## 2024-06-15 MED ORDER — HEPARIN SODIUM (PORCINE) 5000 UNIT/ML IJ SOLN: 5000.0000 [IU] | Freq: Three times a day (TID) | INTRAMUSCULAR | Status: DC

## 2024-06-15 MED ORDER — SODIUM CHLORIDE 0.9 % IV SOLN
1.0000 g | Freq: Two times a day (BID) | INTRAVENOUS | Status: DC
  Administered 2024-06-15 (×2): 1 g via INTRAVENOUS
  Filled 2024-06-15 (×3): qty 20

## 2024-06-15 MED ORDER — ACETAMINOPHEN 325 MG PO TABS
650.0000 mg | ORAL_TABLET | Freq: Four times a day (QID) | ORAL | Status: DC | PRN
  Administered 2024-06-17 – 2024-06-24 (×2): 650 mg via ORAL
  Filled 2024-06-15 (×2): qty 2

## 2024-06-15 NOTE — TOC Progression Note (Signed)
 Transition of Care [facility]) - Progression Note    Patient Details  Name: Richard Mendoza MRN: [id number] Date of Birth: 26-Mar-1976  Transition of [facility]) CM/SW Contact  Tomasa JAYSON Childes, RN Phone Number: 06/15/2024, 3:43 PM  Clinical Narrative:    TOC continuing to follow patient's progress throughout discharge planning.                     Expected Discharge Plan and Services                                               Social Drivers of Health (SDOH) Interventions SDOH Screenings   Food Insecurity: No Food Insecurity (06/14/2024)  Housing: High Risk (06/14/2024)  Transportation Needs: Unmet Transportation Needs (06/14/2024)  Utilities: Not At Risk (06/14/2024)  Depression (PHQ2-9): High Risk (05/19/2024)  Financial Resource Strain: Low Risk  (07/24/2022)   Received from [facility]  Social Connections: Socially Isolated (06/14/2024)  Stress: No Stress Concern Present (07/24/2022)   Received from [facility]  Tobacco Use: Medium Risk (06/13/2024)    Readmission Risk Interventions    01/24/2024   12:35 PM 08/21/2023    9:03 AM  Readmission Risk Prevention Plan  Transportation Screening Complete Complete  PCP or Specialist Appt within 3-5 Days Complete Complete  HRI or Home Care Consult Complete   Social Work Consult for Recovery Care Planning/Counseling Complete Complete  Palliative Care Screening Not Applicable Not Applicable  Medication Review Oceanographer) Complete Complete
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
## 2024-06-15 NOTE — Progress Notes (Signed)
 Patient's appointment cancelled he is current in the hospital.

## 2024-06-15 NOTE — Consult Note (Signed)
 06/15/24 9:19 AM   Richard Mendoza [birth year] [id number]  CC: Scrotal cellulitis  HPI: Comorbid 48 year old male with poorly controlled diabetes(hemoglobin A1c 9), CKD(creatinine 2.2, EGFR 37), who was admitted on 06/13/2024 with scrotal pain and swelling, diagnosed with scrotal cellulitis.  CT at that time showed no drainable fluid collection, he was started on broad-spectrum antibiotics.  Urology was consulted this morning for further evaluation.  He thinks he is slightly improved over the last 24 hours.  A Foley catheter was placed yesterday secondary to some penile swelling and self-reported difficulty voiding.  He has a history of a relatively recent left AKA and has been on Eliquis  since that time, last dose of Eliquis  was last night.  Blood cultures have been no growth thus far.  He denies any prior similar events.   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    Surgical History: Past Surgical History:  Procedure Laterality Date   AMPUTATION Left 11/07/2020   Procedure: 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;  Surgeon: Neill Boas, DPM;  Location: [facility];  Service: Podiatry;  Laterality: Left;   AMPUTATION Left 11/16/2020   Procedure: AMPUTATION BELOW KNEE;  Surgeon: Marea Selinda RAMAN, MD;  Location: [facility];  Service: Vascular;  Laterality: Left;   AMPUTATION Left 01/29/2024   Procedure: AMPUTATION, ABOVE KNEE;  Surgeon: Marea Selinda RAMAN, MD;  Location: [facility];  Service: General;  Laterality: Left;   ANTERIOR CERVICAL DECOMP/DISCECTOMY FUSION N/A 09/09/2017   Procedure: ANTERIOR CERVICAL DECOMPRESSION/DISCECTOMY FUSION CERVICAL 6- CERVICAL 7;  Surgeon: Gillie Duncans, MD;  Location: [facility];  Service: Neurosurgery;  Laterality: N/A;   ANTERIOR CERVICAL DECOMPRESSION/DISCECTOMY FUSION CERVICAL 6- CERVICAL 7   APPENDECTOMY     I & D EXTREMITY Right 10/03/2017   Procedure: IRRIGATION AND DEBRIDEMENT RIGHT WRIST;  Surgeon: Murrell Drivers, MD;  Location: [facility];  Service: Orthopedics;  Laterality: Right;   I & D EXTREMITY Right 11/26/2018   Procedure: IRRIGATION AND DEBRIDEMENT FASCIA ON RIGHT FOOT;  Surgeon: Ashley Soulier, DPM;  Location: [facility];  Service: Podiatry;  Laterality: Right;   INCISION AND DRAINAGE Right 03/06/2019   Procedure: INCISION AND DRAINAGE RIGHT FOOT, WITH 4th RAY AMPUTATION;  Surgeon: Ashley Soulier, DPM;  Location: [facility];  Service: Podiatry;  Laterality: Right;   INCISION AND DRAINAGE Left 11/07/2020   Procedure: INCISION AND DRAINAGE;  Surgeon: Neill Boas, DPM;  Location: [facility];  Service: Podiatry;  Laterality: Left;   INCISION AND DRAINAGE ABSCESS Left 05/02/2023   Procedure: INCISION AND DRAINAGE ABSCESS LEFT LOWER EXTREMITY;  Surgeon: Marea Selinda RAMAN, MD;  Location: [facility];  Service: Vascular;  Laterality: Left;   IRRIGATION AND DEBRIDEMENT FOOT Left 11/11/2020   Procedure: IRRIGATION AND DEBRIDEMENT FOOT;  Surgeon: Neill Boas, DPM;  Location: [facility];  Service: Podiatry;  Laterality: Left;   METATARSAL HEAD EXCISION Right 05/15/2019   Procedure: OSTECTOMY;MET HEAD 5;  Surgeon: Ashley Soulier, DPM;  Location: [facility];  Service: Podiatry;  Laterality: Right;   osteomylitis     ROTATOR CUFF REPAIR Left    SHOULDER ARTHROSCOPY WITH DEBRIDEMENT AND BICEP TENDON REPAIR Right 08/27/2023   Procedure: SHOULDER ARTHROSCOPY WITH INCISION AND DRAINAGE;  Surgeon: Tobie Priest, MD;  Location: [facility];  Service: Orthopedics;  Laterality: Right;     Family History: Family History  Problem Relation Age of Onset   Diabetes Mother    Heart disease Father    Diabetes Father    Arthritis Other    Hyperlipidemia Other    Hypertension Other    Cancer Other        breast   Mental illness Neg Hx     Social  History:  reports that he quit smoking about 18 months ago. His smoking use included cigarettes. He started smoking about 22 years ago. He has a 17 pack-year smoking history. He has never used smokeless tobacco. He reports that he does not currently use alcohol. He reports that he does not use drugs.  Physical Exam: BP 100/64 (BP Location: Left Arm)   Pulse 84   Temp 98.2 F (36.8 C) (Oral)   Resp 20   Ht 5' 10 (1.778 m)   Wt 98.9 kg   SpO2 97%   BMI 31.28 kg/m    Constitutional:  Alert and oriented, No acute distress. Cardiovascular: No clubbing, cyanosis, or edema. Respiratory: Normal respiratory effort, no increased work of breathing. GI: Abdomen is soft, nontender, nondistended, no abdominal masses GU: Moderate penile and left scrotal edema with erythema, no fluctuance or identifiable fluid collections, tender throughout left scrotum, no drainage from the skin  Laboratory Data: Reviewed in epic  Pertinent Imaging: I have personally viewed and interpreted the CT pelvis 06/13/2024 showing scrotal wall thickening but no drainable fluid collection or abscess, no evidence of gas.  Assessment & Plan:   Comorbid 48 year old male with poorly controlled diabetes, CKD, admitted 06/13/2024 with scrotal cellulitis.  No drainable fluid collection on CT or exam.  Has had some mild improvement over the last 24 hours on broad-spectrum antibiotics.  Recommendations:  -Continue broad-spectrum antibiotics -Urology will continue to follow -Consider repeat CT pelvis tomorrow if symptoms or exam worsen -Will hold Eliquis  in case of need for intervention later in the week   Redell Burnet, MD 06/15/2024  [facility] [address], [address] [place], [place] [address] [phone]

## 2024-06-15 NOTE — H&P (View-Only) (Signed)
 06/15/24 9:19 AM   Richard Mendoza [birth year] [id number]  CC: Scrotal cellulitis  HPI: Comorbid 48 year old male with poorly controlled diabetes(hemoglobin A1c 9), CKD(creatinine 2.2, EGFR 37), who was admitted on 06/13/2024 with scrotal pain and swelling, diagnosed with scrotal cellulitis.  CT at that time showed no drainable fluid collection, he was started on broad-spectrum antibiotics.  Urology was consulted this morning for further evaluation.  He thinks he is slightly improved over the last 24 hours.  A Foley catheter was placed yesterday secondary to some penile swelling and self-reported difficulty voiding.  He has a history of a relatively recent left AKA and has been on Eliquis  since that time, last dose of Eliquis  was last night.  Blood cultures have been no growth thus far.  He denies any prior similar events.   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    Surgical History: Past Surgical History:  Procedure Laterality Date   AMPUTATION Left 11/07/2020   Procedure: 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;  Surgeon: Neill Boas, DPM;  Location: [facility];  Service: Podiatry;  Laterality: Left;   AMPUTATION Left 11/16/2020   Procedure: AMPUTATION BELOW KNEE;  Surgeon: Marea Selinda RAMAN, MD;  Location: [facility];  Service: Vascular;  Laterality: Left;   AMPUTATION Left 01/29/2024   Procedure: AMPUTATION, ABOVE KNEE;  Surgeon: Marea Selinda RAMAN, MD;  Location: [facility];  Service: General;  Laterality: Left;   ANTERIOR CERVICAL DECOMP/DISCECTOMY FUSION N/A 09/09/2017   Procedure: ANTERIOR CERVICAL DECOMPRESSION/DISCECTOMY FUSION CERVICAL 6- CERVICAL 7;  Surgeon: Gillie Duncans, MD;  Location: [facility];  Service: Neurosurgery;  Laterality: N/A;   ANTERIOR CERVICAL DECOMPRESSION/DISCECTOMY FUSION CERVICAL 6- CERVICAL 7   APPENDECTOMY     I & D EXTREMITY Right 10/03/2017   Procedure: IRRIGATION AND DEBRIDEMENT RIGHT WRIST;  Surgeon: Murrell Drivers, MD;  Location: [facility];  Service: Orthopedics;  Laterality: Right;   I & D EXTREMITY Right 11/26/2018   Procedure: IRRIGATION AND DEBRIDEMENT FASCIA ON RIGHT FOOT;  Surgeon: Ashley Soulier, DPM;  Location: [facility];  Service: Podiatry;  Laterality: Right;   INCISION AND DRAINAGE Right 03/06/2019   Procedure: INCISION AND DRAINAGE RIGHT FOOT, WITH 4th RAY AMPUTATION;  Surgeon: Ashley Soulier, DPM;  Location: [facility];  Service: Podiatry;  Laterality: Right;   INCISION AND DRAINAGE Left 11/07/2020   Procedure: INCISION AND DRAINAGE;  Surgeon: Neill Boas, DPM;  Location: [facility];  Service: Podiatry;  Laterality: Left;   INCISION AND DRAINAGE ABSCESS Left 05/02/2023   Procedure: INCISION AND DRAINAGE ABSCESS LEFT LOWER EXTREMITY;  Surgeon: Marea Selinda RAMAN, MD;  Location: [facility];  Service: Vascular;  Laterality: Left;   IRRIGATION AND DEBRIDEMENT FOOT Left 11/11/2020   Procedure: IRRIGATION AND DEBRIDEMENT FOOT;  Surgeon: Neill Boas, DPM;  Location: [facility];  Service: Podiatry;  Laterality: Left;   METATARSAL HEAD EXCISION Right 05/15/2019   Procedure: OSTECTOMY;MET HEAD 5;  Surgeon: Ashley Soulier, DPM;  Location: [facility];  Service: Podiatry;  Laterality: Right;   osteomylitis     ROTATOR CUFF REPAIR Left    SHOULDER ARTHROSCOPY WITH DEBRIDEMENT AND BICEP TENDON REPAIR Right 08/27/2023   Procedure: SHOULDER ARTHROSCOPY WITH INCISION AND DRAINAGE;  Surgeon: Tobie Priest, MD;  Location: [facility];  Service: Orthopedics;  Laterality: Right;     Family History: Family History  Problem Relation Age of Onset   Diabetes Mother    Heart disease Father    Diabetes Father    Arthritis Other    Hyperlipidemia Other    Hypertension Other    Cancer Other        breast   Mental illness Neg Hx     Social  History:  reports that he quit smoking about 18 months ago. His smoking use included cigarettes. He started smoking about 22 years ago. He has a 17 pack-year smoking history. He has never used smokeless tobacco. He reports that he does not currently use alcohol. He reports that he does not use drugs.  Physical Exam: BP 100/64 (BP Location: Left Arm)   Pulse 84   Temp 98.2 F (36.8 C) (Oral)   Resp 20   Ht 5' 10 (1.778 m)   Wt 98.9 kg   SpO2 97%   BMI 31.28 kg/m    Constitutional:  Alert and oriented, No acute distress. Cardiovascular: No clubbing, cyanosis, or edema. Respiratory: Normal respiratory effort, no increased work of breathing. GI: Abdomen is soft, nontender, nondistended, no abdominal masses GU: Moderate penile and left scrotal edema with erythema, no fluctuance or identifiable fluid collections, tender throughout left scrotum, no drainage from the skin  Laboratory Data: Reviewed in epic  Pertinent Imaging: I have personally viewed and interpreted the CT pelvis 06/13/2024 showing scrotal wall thickening but no drainable fluid collection or abscess, no evidence of gas.  Assessment & Plan:   Comorbid 48 year old male with poorly controlled diabetes, CKD, admitted 06/13/2024 with scrotal cellulitis.  No drainable fluid collection on CT or exam.  Has had some mild improvement over the last 24 hours on broad-spectrum antibiotics.  Recommendations:  -Continue broad-spectrum antibiotics -Urology will continue to follow -Consider repeat CT pelvis tomorrow if symptoms or exam worsen -Will hold Eliquis  in case of need for intervention later in the week   Redell Burnet, MD 06/15/2024  [facility] [address], [address] [place], [place] [address] [phone]

## 2024-06-15 NOTE — Telephone Encounter (Signed)
 Secure chat sent to MD

## 2024-06-15 NOTE — Progress Notes (Signed)
 Progress Note   Patient: Richard Mendoza FMW:[id number] DOB: 01-30-1976 DOA: 06/13/2024     2 DOS: the patient was seen and examined on 06/15/2024   Brief hospital course:  Markus Casten Talamante is a 48 y.o. male with medical history significant of status post left AKA, PVD on Eliquis , DM-type I on insulin  pump, hypertension, chronic pain syndrome, CKD-3a, HLD, tobacco abuse, who presents with scrotal pain and fever.  Pt state that 3 days ago he had some irritation in his perineal area that he thought may be related to an ingrown hair.  Ever since then, he has developed pain and swelling in the left scrotal area, as well as of his penile foreskin.  The pain is constant, aching, severe, nonradiating, not aggravated or alleviated by any known factors. He has difficulty retracting his foreskin with difficult urinating, but still able to void.  He developed fever and chills.  His temperature is 101.2 in the ED.  Patient does not have chest pain, cough, SOB.  No nausea, vomiting, diarrhea or abdominal pain.   Data reviewed independently and ED Course: pt was found to have WBC 13.7, worsening renal function, lactic acid 2.2 --> 2.0, temperature 101.2, blood pressure 174/114, heart rate 120-130, RR 16, oxygen saturation 100% on room air.  Patient is admitted to telemetry bed as inpatient.  ED physician discussed with Dr. Nieves of urology.   CT of abdomen/pelvis: 1. Diffuse scrotal wall thickening and inflammatory changes in the left perineum. No drainable fluid collection or abscess. No soft tissue gas. 2.  Aortic Atherosclerosis (ICD10-I70.0).   EKG: I have personally reviewed.  Sinus rhythm, QTc 482, bifascicular block     Assessment and Plan:  Sepsis due to cellulitis of scrotum:  No abscess formation by CT scan.   Patient met criteria for sepsis with WBC 13.7, temperature 101.2, heart rate up to 130s.  Lactic acid 2.2 -> 2.0.  ED patient discussed with Dr. Nieves of urology.   Recommended reconsultation as needed Patient seen in consultation by Dr Francisca 07/28, agrees with continuing IV antibiotic therapy and recommends to hold patient's Eliquis  for possible procedure. Will discontinue vancomycin  and Zosyn  due to worsening renal function and start patient on IV meropenem  adjusted to renal function Follow-up results of blood cultures.    Essential hypertension  Lasix  and losartan are on hold  due to worsening renal function   Acute kidney injury superimposed on stage IIIb chronic kidney disease Unclear etiology and may be medication induced At baseline patient has a serum creatinine of 2.1 and today it is 3.76 Will hold vancomycin  and Zosyn  Continue to hold losartan and Lasix   Will consult nephrology    Acute anemia superimposed on anemia of chronic kidney disease Noted to have a hemoglobin of 7.9 from a baseline of 13 Most likely hemodilutional Will obtain iron  studies    Acute Urinary Retention Patient with complaints of difficulty voiding Bladder scan yielded > 300cc of urine Foley catheter was placed    PAD (peripheral artery disease) (HCC) Continue Eliquis , Lipitor    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   Continue long-acting insulin  decreased to 40 units daily SSI Maintain consistent carbohydrate diet   Chronic diastolic CHF (congestive heart failure) (HCC):  2D Echo on 08/28/2023 showed EF of 55-60% with grade 1 diastolic dysfunction. CHF is compensated. - Hold Lasix  due to AKI     HLD (hyperlipidemia) -Lipitor, fenofibrate     Iron  deficiency anemia,  unspecified: Hemoglobin stable, 11.4 today. - Continue iron  supplement   Chronic pain syndrome -Continue home oral Dilaudid , lyrica     Depression -Continue sertraline  and trazodone     Overweight (BMI 25.0-29.9): Body weight 92 kg, BMI 29.10 - Encourage losing weight - Exercise and healthy diet         Subjective: Patient is seen and examined at the bedside.   Physical Exam: Vitals:   06/15/24 0430 06/15/24 0500 06/15/24 0900 06/15/24 1100  BP: 100/64   108/64  Pulse: 84   71  Resp: 20     Temp: 98.2 F (36.8 C)   98.5 F (36.9 C)  TempSrc: Oral   Oral  SpO2: 97%  97%   Weight:  98.9 kg    Height:       General: Not in acute distress HEENT:       Eyes: PERRL, EOMI, no jaundice       ENT: No discharge from the ears and nose, no pharynx injection, no tonsillar enlargement.        Neck: No JVD, no bruit, no mass felt. Heme: No neck lymph node enlargement. Cardiac: S1/S2, RRR, No murmurs, No gallops or rubs. Respiratory: No rales, wheezing, rhonchi or rubs. GI: Soft, nondistended, nontender, no rebound pain, no organomegaly, BS present. GU: has erythema, tenderness, warmth, swelling to left scrotum and penile foreskin.  No fluctuance.           Ext: No pitting leg edema bilaterally. S/p of left AKA Musculoskeletal: No joint deformities, No joint redness or warmth, no limitation of ROM in spin. Skin: No rashes.  Neuro: Alert, oriented X3, cranial nerves II-XII grossly intact, moves all extremities normally. Psych: Patient is not psychotic, no suicidal or hemocidal ideation.      Data Reviewed: Serum iron  8, hemoglobin 7.9, white count 13.2, BUN 38, creatinine 3.76 There are no new results to review at this time.  Family Communication: Plan of care was discussed with patient in detail.  He verbalizes understanding and agrees with the plan.  Disposition: Status is: Inpatient Remains inpatient appropriate because: Requires IV antibiotics for scrotal cellulitis  Planned Discharge Destination: Home    Time spent: 45 minutes  Author: Aimee Somerset, MD 06/15/2024 1:08 PM  For on call review [email or web address].

## 2024-06-15 NOTE — Plan of Care (Signed)

## 2024-06-15 NOTE — Plan of Care (Signed)
  Problem: Nutritional: Goal: Maintenance of adequate nutrition will improve Outcome: Progressing   Problem: Skin Integrity: Goal: Risk for impaired skin integrity will decrease Outcome: Progressing   Problem: Education: Goal: Knowledge of General Education information will improve Description: Including pain rating scale, medication(s)/side effects and non-pharmacologic comfort measures Outcome: Progressing   Problem: Activity: Goal: Risk for activity intolerance will decrease Outcome: Progressing   Problem: Safety: Goal: Ability to remain free from injury will improve Outcome: Progressing
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
## 2024-06-16 ENCOUNTER — Other Ambulatory Visit: Payer: Self-pay | Admitting: *Deleted

## 2024-06-16 ENCOUNTER — Inpatient Hospital Stay

## 2024-06-16 DIAGNOSIS — E1165 Type 2 diabetes mellitus with hyperglycemia: Secondary | ICD-10-CM

## 2024-06-16 DIAGNOSIS — Z794 Long term (current) use of insulin: Secondary | ICD-10-CM

## 2024-06-16 DIAGNOSIS — N492 Inflammatory disorders of scrotum: Secondary | ICD-10-CM | POA: Diagnosis not present

## 2024-06-16 DIAGNOSIS — Z8614 Personal history of Methicillin resistant Staphylococcus aureus infection: Secondary | ICD-10-CM | POA: Diagnosis not present

## 2024-06-16 DIAGNOSIS — N189 Chronic kidney disease, unspecified: Secondary | ICD-10-CM

## 2024-06-16 LAB — CBC
HCT: 23.9 % — ABNORMAL LOW (ref 39.0–52.0)
Hemoglobin: 7.6 g/dL — ABNORMAL LOW (ref 13.0–17.0)
MCH: 24.7 pg — ABNORMAL LOW (ref 26.0–34.0)
MCHC: 31.8 g/dL (ref 30.0–36.0)
MCV: 77.6 fL — ABNORMAL LOW (ref 80.0–100.0)
Platelets: 186 K/uL (ref 150–400)
RBC: 3.08 MIL/uL — ABNORMAL LOW (ref 4.22–5.81)
RDW: 16.7 % — ABNORMAL HIGH (ref 11.5–15.5)
WBC: 13.4 K/uL — ABNORMAL HIGH (ref 4.0–10.5)
nRBC: 0 % (ref 0.0–0.2)

## 2024-06-16 LAB — RENAL FUNCTION PANEL
Albumin: 2.3 g/dL — ABNORMAL LOW (ref 3.5–5.0)
Anion gap: 10 (ref 5–15)
BUN: 45 mg/dL — ABNORMAL HIGH (ref 6–20)
CO2: 17 mmol/L — ABNORMAL LOW (ref 22–32)
Calcium: 8.5 mg/dL — ABNORMAL LOW (ref 8.9–10.3)
Chloride: 104 mmol/L (ref 98–111)
Creatinine, Ser: 4.55 mg/dL — ABNORMAL HIGH (ref 0.61–1.24)
GFR, Estimated: 15 mL/min — ABNORMAL LOW (ref >60)
Glucose, Bld: 321 mg/dL — ABNORMAL HIGH (ref 70–99)
Phosphorus: 3.6 mg/dL (ref 2.5–4.6)
Potassium: 4.6 mmol/L (ref 3.5–5.1)
Sodium: 131 mmol/L — ABNORMAL LOW (ref 135–145)

## 2024-06-16 LAB — HEPATIC FUNCTION PANEL
ALT: 10 U/L (ref 0–44)
AST: 14 U/L — ABNORMAL LOW (ref 15–41)
Albumin: 2 g/dL — ABNORMAL LOW (ref 3.5–5.0)
Alkaline Phosphatase: 79 U/L (ref 38–126)
Bilirubin, Direct: <0.1 mg/dL (ref 0.0–0.2)
Total Bilirubin: 0.5 mg/dL (ref 0.0–1.2)
Total Protein: 5.4 g/dL — ABNORMAL LOW (ref 6.5–8.1)

## 2024-06-16 LAB — GLUCOSE, CAPILLARY
Glucose-Capillary: 293 mg/dL — ABNORMAL HIGH (ref 70–99)
Glucose-Capillary: 241 mg/dL — ABNORMAL HIGH (ref 70–99)
Glucose-Capillary: 224 mg/dL — ABNORMAL HIGH (ref 70–99)
Glucose-Capillary: 159 mg/dL — ABNORMAL HIGH (ref 70–99)

## 2024-06-16 MED ORDER — INSULIN GLARGINE-YFGN 100 UNIT/ML ~~LOC~~ SOLN
45.0000 [IU] | Freq: Every day | SUBCUTANEOUS | Status: DC
  Administered 2024-06-16 – 2024-06-19 (×4): 45 [IU] via SUBCUTANEOUS
  Filled 2024-06-16 (×4): qty 0.45

## 2024-06-16 MED ORDER — PIPERACILLIN-TAZOBACTAM 3.375 G IVPB
3.3750 g | Freq: Three times a day (TID) | INTRAVENOUS | Status: DC
  Administered 2024-06-16 – 2024-06-21 (×17): 3.375 g via INTRAVENOUS
  Filled 2024-06-16 (×17): qty 50

## 2024-06-16 MED ORDER — HYDROMORPHONE HCL 1 MG/ML IJ SOLN
1.0000 mg | INTRAMUSCULAR | Status: AC | PRN
  Administered 2024-06-16 – 2024-06-18 (×12): 1 mg via INTRAVENOUS
  Filled 2024-06-16 (×12): qty 1

## 2024-06-16 MED ORDER — SODIUM BICARBONATE 650 MG PO TABS
650.0000 mg | ORAL_TABLET | Freq: Three times a day (TID) | ORAL | Status: DC
  Administered 2024-06-16 – 2024-06-17 (×3): 650 mg via ORAL
  Filled 2024-06-16 (×3): qty 1

## 2024-06-16 MED ORDER — BASAGLAR KWIKPEN 100 UNIT/ML ~~LOC~~ SOPN: 60.0000 [IU] | PEN_INJECTOR | Freq: Every day | SUBCUTANEOUS | 0 refills | Status: DC

## 2024-06-16 NOTE — Progress Notes (Signed)
 Date of Admission:  06/13/2024      ID: Richard Mendoza is a 48 y.o. 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)   Chronic diastolic CHF (congestive heart failure) (HCC)   Depression   Acute renal failure superimposed on stage 3b chronic kidney disease (HCC)   Cellulitis of perineum    Subjective: More pain and swelling of scrotum today Had repeat CT pelvis  Medications:   ascorbic acid   500 mg Oral Daily   atorvastatin   40 mg Oral Daily   Chlorhexidine  Gluconate Cloth  6 each Topical Daily   cyanocobalamin   1,000 mcg Oral Daily   famotidine   10 mg Oral BID   feeding supplement (GLUCERNA SHAKE)  237 mL Oral TID BM   fenofibrate   54 mg Oral Daily   ferrous sulfate   325 mg Oral Q breakfast   insulin  aspart  0-9 Units Subcutaneous TID WC   insulin  glargine-yfgn  45 Units Subcutaneous QHS   pantoprazole   40 mg Oral Daily   pregabalin   75 mg Oral BID   sertraline   100 mg Oral Daily   sodium bicarbonate   650 mg Oral TID   traZODone   100 mg Oral QHS    Objective: Vital signs in last 24 hours: Patient Vitals for the past 24 hrs:  BP Temp Temp src Pulse Resp SpO2 Weight  06/16/24 1938 129/77 98.4 F (36.9 C) Oral 62 18 97 % --  06/16/24 1655 (!) 180/93 98.3 F (36.8 C) -- 85 -- 98 % --  06/16/24 1423 139/77 98.2 F (36.8 C) -- 71 -- 98 % --  06/16/24 0815 131/76 98.5 F (36.9 C) -- 78 -- 98 % --  06/16/24 0640 -- -- -- -- -- -- 94.2 kg  06/16/24 0525 125/80 98.7 F (37.1 C) Oral 78 18 98 % --  06/15/24 2353 93/64 98.2 F (36.8 C) Oral 72 18 96 % --  06/15/24 2159 115/70 98.6 F (37 C) Oral 84 18 98 % --     Lines and Device Date on insertion # of days DC  Surveyor, mining     ETT       PHYSICAL EXAM:  General: Alert, cooperative, no distress, appears stated age.  Lungs: Clear to auscultation bilaterally. No Wheezing or Rhonchi. No rales. Heart: Regular rate and rhythm, no murmur, rub or gallop. Abdomen: Soft, non-tender,not distended. Bowel sounds normal. No masses Cscrotum swollen and eythematous and tender- left > rt  Extremities: left AKA Skin: No rashes or lesions. Or bruising Lymph: Cervical, supraclavicular normal. Neurologic: Grossly non-focal  Lab Results    Latest Ref Rng & Units 06/16/2024    9:14 AM 06/15/2024    5:03 AM 06/14/2024    4:47 AM  CBC  WBC 4.0 - 10.5 K/uL 13.4  13.2  15.7   Hemoglobin 13.0 - 17.0 g/dL 7.6  7.9  8.8   Hematocrit 39.0 - 52.0 % 23.9  24.8  26.8   Platelets 150 - 400 K/uL 186  157  188        Latest Ref Rng & Units 06/16/2024    9:14 AM 06/15/2024    5:03 AM 06/14/2024  4:47 AM  CMP  Glucose 70 - 99 mg/dL 678  839  803   BUN 6 - 20 mg/dL 45  38  29   Creatinine 0.61 - 1.24 mg/dL 5.44  6.23  6.97   Sodium 135 - 145 mmol/L 131  135  133   Potassium 3.5 - 5.1 mmol/L 4.6  4.3  3.9   Chloride 98 - 111 mmol/L 104  108  110   CO2 22 - 32 mmol/L 17  19  18    Calcium  8.9 - 10.3 mg/dL 8.5  8.4  8.0       Microbiology: BC- NG Studies/Results: US  RENAL Result Date: 06/16/2024 CLINICAL DATA:  Acute kidney injury EXAM: RENAL / URINARY TRACT ULTRASOUND COMPLETE COMPARISON:  None Available. FINDINGS: Right Kidney: Renal measurements: 12.6 x 5.9 x 6.8 cm = volume: 264 mL. Echogenicity within normal limits. No mass or hydronephrosis visualized. Left Kidney: Renal measurements: 12.9 x 6.8 x 5.9 cm = volume: 271 mL. Echogenicity within normal limits. There is a simple cyst in the upper pole measuring 5 x 4 x 6 mm. No mass or hydronephrosis visualized. Bladder: Decompressed by Foley catheter. Other: None. IMPRESSION: 1. No hydronephrosis. 2. Small simple cyst in the upper pole of the left kidney. Electronically  Signed   By: Greig Pique M.D.   On: 06/16/2024 16:57   CT PELVIS WO CONTRAST Result Date: 06/16/2024 CLINICAL DATA:  Scrotal swelling and edema. EXAM: CT PELVIS WITHOUT CONTRAST TECHNIQUE: Multidetector CT imaging of the pelvis was performed following the standard protocol without intravenous contrast. RADIATION DOSE REDUCTION: This exam was performed according to the departmental dose-optimization program which includes automated exposure control, adjustment of the mA and/or kV according to patient size and/or use of iterative reconstruction technique. COMPARISON:  CT dated 06/13/2024. FINDINGS: Evaluation of this exam is limited in the absence of intravenous contrast. Urinary Tract: The urinary bladder is decompressed around a Foley catheter. Bowel:  No dilated bowel loops noted in the pelvis. Vascular/Lymphatic: Mild aortoiliac atherosclerotic disease. Mildly enlarged left external iliac chain lymph node measures 11 mm short axis. Reproductive: The prostate gland is grossly unremarkable. There is diffuse scrotal and perineal swelling and edema, progressed since the prior CT. Faint 3.7 x 1.6 cm ovoid area in the inferior left scrotal wall (64/2) may represent a developing abscess. Ultrasound may provide better evaluation if the patient is able to tolerate. No soft tissue gas. Other:  None Musculoskeletal: Degenerative changes.  No acute osseous pathology. IMPRESSION: 1. Worsened scrotal swelling with findings concerning for developing abscess in the inferior left scrotum. No soft tissue gas. 2.  Aortic Atherosclerosis (ICD10-I70.0). Electronically Signed   By: Vanetta Chou M.D.   On: 06/16/2024 13:11     Assessment/Plan: Scrotal wall cellulitis and soft tissue infection-now with development scrotal abscess on the left inferior He is going for I/D tomorrow He has h/o MRSA/staph infection- Currently on zosyn  ( was on meropenem  which was changed to zosyn  this afternoon) and linezolid     AKI on  CKD- worsening Combination of infection, meds , ? Prerenal decrased intake Was on vanco + zosyn - vanco has been Dc and started linezolid  Okay to be on zosyn - no need for meropenem  HE also got a dose of ibuprofen ,no IV contrast  Pt is on dilaudid  - watch closely for over sedation because of worsening kidney function  Anemia   Renal US   Pt has foley   DM- last A1c from 7/7 is 9.2 ( was 13.6 in March  2025)   Left AKA   Anxiety /Depression- on sertraline  and trazadone- watch closely for any serotonin syndrome with linezolid   Discussed the management with the patient

## 2024-06-16 NOTE — Progress Notes (Signed)
 [facility]  ROUNDING NOTE   Subjective:   Patient seen laying in bed Alert and oriented Appetite remains appropriate  Creatinine 4.55  Objective:  Vital signs in last 24 hours:  Temp:  [98.2 F (36.8 C)-98.7 F (37.1 C)] 98.5 F (36.9 C) (07/29 0815) Pulse Rate:  [72-84] 78 (07/29 0815) Resp:  [18-20] 18 (07/29 0525) BP: (93-131)/(61-80) 131/76 (07/29 0815) SpO2:  [94 %-98 %] 98 % (07/29 0815) Weight:  [94.2 kg] 94.2 kg (07/29 0640)  Weight change: -4.7 kg Filed Weights   06/13/24 1529 06/15/24 0500 06/16/24 0640  Weight: 92 kg 98.9 kg 94.2 kg    Intake/Output: I/O last 3 completed shifts: In: 904.1 [P.O.:140; I.V.:364.1; IV Piggyback:400] Out: 2250 [Urine:2250]   Intake/Output this shift:  No intake/output data recorded.  Physical Exam: General: NAD  Head: Normocephalic, atraumatic. Moist oral mucosal membranes  Eyes: Anicteric  Neck: Supple  Lungs:  Clear to auscultation  Heart: Regular rate and rhythm  Abdomen:  Soft, nontender  Extremities:  No peripheral edema.Lt AKA  Neurologic: Awake, alert, conversant  Skin: Warm,dry, no rash       Basic Metabolic Panel: Recent Labs  Lab 06/13/24 1532 06/14/24 0447 06/15/24 0503 06/16/24 0914  NA 134* 133* 135 131*  K 4.0 3.9 4.3 4.6  CL 100 110 108 104  CO2 21* 18* 19* 17*  GLUCOSE 350* 196* 160* 321*  BUN 31* 29* 38* 45*  CREATININE 3.16* 3.02* 3.76* 4.55*  CALCIUM  9.1 8.0* 8.4* 8.5*  PHOS  --   --   --  3.6    Liver Function Tests: Recent Labs  Lab 06/13/24 1532 06/16/24 0914  AST 16  --   ALT 13  --   ALKPHOS 101  --   BILITOT 0.8  --   PROT 7.4  --   ALBUMIN 3.4* 2.3*   No results for input(s): LIPASE, AMYLASE in the last 168 hours. No results for input(s): AMMONIA in the last 168 hours.  CBC: Recent Labs  Lab 06/13/24 1532 06/14/24 0447 06/15/24 0503 06/16/24 0914  WBC 13.7* 15.7* 13.2* 13.4*  NEUTROABS 10.8*  --   --   --   HGB 11.4* 8.8* 7.9* 7.6*  HCT  35.4* 26.8* 24.8* 23.9*  MCV 78.1* 77.9* 79.0* 77.6*  PLT 257 188 157 186    Cardiac Enzymes: No results for input(s): CKTOTAL, CKMB, CKMBINDEX, TROPONINI in the last 168 hours.  BNP: Invalid input(s): POCBNP  CBG: Recent Labs  Lab 06/15/24 1153 06/15/24 1649 06/15/24 2146 06/16/24 0812 06/16/24 1333  GLUCAP 191* 176* 301* 293* 241*    Microbiology: Results for orders placed or performed during the hospital encounter of 06/13/24  Blood Culture (routine x 2)     Status: None (Preliminary result)   Collection Time: 06/13/24  3:32 PM   Specimen: BLOOD RIGHT ARM  Result Value Ref Range Status   Specimen Description BLOOD RIGHT ARM  Final   Special Requests   Final    BOTTLES DRAWN AEROBIC AND ANAEROBIC Blood Culture results may not be optimal due to an inadequate volume of blood received in culture bottles   Culture   Final    NO GROWTH 3 DAYS Performed at [facility], [address] [place], [place] [address]    Report Status PENDING  Incomplete  Blood Culture (routine x 2)     Status: None (Preliminary result)   Collection Time: 06/13/24  4:37 PM   Specimen: BLOOD  Result Value Ref Range  Status   Specimen Description BLOOD BLOOD LEFT FOREARM  Final   Special Requests   Final    BOTTLES DRAWN AEROBIC AND ANAEROBIC Blood Culture results may not be optimal due to an inadequate volume of blood received in culture bottles   Culture   Final    NO GROWTH 3 DAYS Performed at [facility], [address] [place], [place] [address]    Report Status PENDING  Incomplete    Coagulation Studies: Recent Labs    06/13/24 1936  LABPROT 15.6*  INR 1.2    Urinalysis: Recent Labs    06/13/24 1637  COLORURINE STRAW*  LABSPEC 1.011  PHURINE 5.0  GLUCOSEU >=500*  HGBUR NEGATIVE  BILIRUBINUR NEGATIVE  KETONESUR NEGATIVE  PROTEINUR 100*  NITRITE NEGATIVE  LEUKOCYTESUR NEGATIVE      Imaging: CT PELVIS WO CONTRAST Result Date:  06/16/2024 CLINICAL DATA:  Scrotal swelling and edema. EXAM: CT PELVIS WITHOUT CONTRAST TECHNIQUE: Multidetector CT imaging of the pelvis was performed following the standard protocol without intravenous contrast. RADIATION DOSE REDUCTION: This exam was performed according to the departmental dose-optimization program which includes automated exposure control, adjustment of the mA and/or kV according to patient size and/or use of iterative reconstruction technique. COMPARISON:  CT dated 06/13/2024. FINDINGS: Evaluation of this exam is limited in the absence of intravenous contrast. Urinary Tract: The urinary bladder is decompressed around a Foley catheter. Bowel:  No dilated bowel loops noted in the pelvis. Vascular/Lymphatic: Mild aortoiliac atherosclerotic disease. Mildly enlarged left external iliac chain lymph node measures 11 mm short axis. Reproductive: The prostate gland is grossly unremarkable. There is diffuse scrotal and perineal swelling and edema, progressed since the prior CT. Faint 3.7 x 1.6 cm ovoid area in the inferior left scrotal wall (64/2) may represent a developing abscess. Ultrasound may provide better evaluation if the patient is able to tolerate. No soft tissue gas. Other:  None Musculoskeletal: Degenerative changes.  No acute osseous pathology. IMPRESSION: 1. Worsened scrotal swelling with findings concerning for developing abscess in the inferior left scrotum. No soft tissue gas. 2.  Aortic Atherosclerosis (ICD10-I70.0). Electronically Signed   By: Vanetta Chou M.D.   On: 06/16/2024 13:11     Medications:    linezolid  (ZYVOX ) IV 600 mg (06/16/24 0925)   piperacillin -tazobactam (ZOSYN )  IV 3.375 g (06/16/24 1325)    ascorbic acid   500 mg Oral Daily   atorvastatin   40 mg Oral Daily   Chlorhexidine  Gluconate Cloth  6 each Topical Daily   cyanocobalamin   1,000 mcg Oral Daily   famotidine   10 mg Oral BID   feeding supplement (GLUCERNA SHAKE)  237 mL Oral TID BM   fenofibrate    54 mg Oral Daily   ferrous sulfate   325 mg Oral Q breakfast   insulin  aspart  0-9 Units Subcutaneous TID WC   insulin  glargine-yfgn  40 Units Subcutaneous QHS   pantoprazole   40 mg Oral Daily   pregabalin   75 mg Oral BID   sertraline   100 mg Oral Daily   traZODone   100 mg Oral QHS   acetaminophen , HYDROmorphone  (DILAUDID ) injection, hydrOXYzine , ondansetron  (ZOFRAN ) IV  Assessment/ Plan:  Mr. Deakin Lacek Shingler is a 48 y.o.  male with past medical conditions including PVD on Eliquis , type 1 diabetes with insulin  pump, hypertension, hyperlipidemia, left AKA, CKD stage III B,, who was admitted to [facility] on 06/13/2024 for Cellulitis of perineum [L03.315] Cellulitis of scrotum [N49.2]   Acute kidney injury on chronic kidney disease stage IIIb.  Baseline creatinine appears to be 2.09 with GFR 39 on 02/05/2024.  Acute kidney injury likely secondary to infection.  Creatinine 3.16 on admission.  CT pelvis negative for obstruction.  No IV contrast exposure.  No acute indication for dialysis.  Creatinine continues to worsen. Patient encouraged to maintain oral intake. No acute need for dialysis Will continue to monitor.   Lab Results  Component Value Date   CREATININE 4.55 (H) 06/16/2024   CREATININE 3.76 (H) 06/15/2024   CREATININE 3.02 (H) 06/14/2024    Intake/Output Summary (Last 24 hours) at 06/16/2024 1359 Last data filed at 06/16/2024 0636 Gross per 24 hour  Intake 100 ml  Output 1450 ml  Net -1350 ml    2. Anemia of chronic kidney disease Lab Results  Component Value Date   HGB 7.6 (L) 06/16/2024    Hgb below optimal range, will consider ESA.  3. Acute metabolic acidosis, likely secondary to kidney injury.  S bicarb decreased. Will order oral supplementation 650mg  three times daily.    4. Diabetes mellitus type II with chronic kidney disease/renal manifestations: insulin  dependent. Home regimen includes NovoLog  and Lantus . Most recent hemoglobin A1c is 9.2 on 05/25/2024.    LOS:  3 Alakai Macbride 7/29/20251:59 PM

## 2024-06-16 NOTE — Progress Notes (Signed)
 Urology Inpatient Progress Note  Subjective: No acute events overnight. He is afebrile, VSS. No CBC today.  Blood cultures pending with no growth at 3 days, on antibiotics as below. He is accompanied today by his mother at the bedside.  He describes more swelling and pain today.   He has been n.p.o. since midnight.  Last Eliquis  7/27 at 21:54.  Anti-infectives: Anti-infectives (From admission, onward)    Start     Dose/Rate Route Frequency Ordered Stop   06/15/24 1715  linezolid  (ZYVOX ) IVPB 600 mg        600 mg 300 mL/hr over 60 Minutes Intravenous Every 12 hours 06/15/24 1614     06/15/24 1000  meropenem  (MERREM ) 1 g in sodium chloride  0.9 % 100 mL IVPB  Status:  Discontinued        1 g 200 mL/hr over 30 Minutes Intravenous Every 12 hours 06/15/24 0823 06/16/24 0907   06/14/24 2000  vancomycin  (VANCOCIN ) IVPB 1000 mg/200 mL premix  Status:  Discontinued        1,000 mg 200 mL/hr over 60 Minutes Intravenous Every 24 hours 06/14/24 0918 06/15/24 0811   06/13/24 2100  piperacillin -tazobactam (ZOSYN ) IVPB 3.375 g  Status:  Discontinued        3.375 g 12.5 mL/hr over 240 Minutes Intravenous Every 8 hours 06/13/24 1929 06/15/24 0811   06/13/24 2000  vancomycin  (VANCOCIN ) IVPB 1000 mg/200 mL premix        1,000 mg 200 mL/hr over 60 Minutes Intravenous  Once 06/13/24 1929 06/13/24 2205   06/13/24 1928  vancomycin  variable dose per unstable renal function (pharmacist dosing)  Status:  Discontinued         Does not apply See admin instructions 06/13/24 1929 06/14/24 0918   06/13/24 1630  ceFEPIme  (MAXIPIME ) 2 g in sodium chloride  0.9 % 100 mL IVPB        2 g 200 mL/hr over 30 Minutes Intravenous  Once 06/13/24 1627 06/13/24 1724   06/13/24 1630  metroNIDAZOLE  (FLAGYL ) IVPB 500 mg        500 mg 100 mL/hr over 60 Minutes Intravenous  Once 06/13/24 1627 06/13/24 1845   06/13/24 1630  vancomycin  (VANCOCIN ) IVPB 1000 mg/200 mL premix        1,000 mg 200 mL/hr over 60 Minutes Intravenous  Once  06/13/24 1627 06/13/24 1953       Current Facility-Administered Medications  Medication Dose Route Frequency Provider Last Rate Last Admin   acetaminophen  (TYLENOL ) tablet 650 mg  650 mg Oral Q6H PRN Patel, Kishan S, RPH       ascorbic acid  (VITAMIN C ) tablet 500 mg  500 mg Oral Daily Niu, Xilin, MD   500 mg at 06/16/24 0914   atorvastatin  (LIPITOR) tablet 40 mg  40 mg Oral Daily Niu, Xilin, MD   40 mg at 06/16/24 9088   Chlorhexidine  Gluconate Cloth 2 % PADS 6 each  6 each Topical Daily Agbata, Tochukwu, MD   6 each at 06/15/24 9082   cyanocobalamin  (VITAMIN B12) tablet 1,000 mcg  1,000 mcg Oral Daily Niu, Xilin, MD   1,000 mcg at 06/16/24 0912   famotidine  (PEPCID ) tablet 10 mg  10 mg Oral BID Niu, Xilin, MD   10 mg at 06/16/24 0912   feeding supplement (GLUCERNA SHAKE) (GLUCERNA SHAKE) liquid 237 mL  237 mL Oral TID BM Agbata, Tochukwu, MD   237 mL at 06/15/24 2142   fenofibrate  tablet 54 mg  54 mg Oral Daily Niu, Xilin, MD  54 mg at 06/16/24 9083   ferrous sulfate  tablet 325 mg  325 mg Oral Q breakfast Niu, Xilin, MD   325 mg at 06/16/24 9083   HYDROmorphone  (DILAUDID ) injection 1 mg  1 mg Intravenous Q3H PRN Agbata, Tochukwu, MD   1 mg at 06/16/24 0920   hydrOXYzine  (ATARAX ) tablet 25 mg  25 mg Oral TID PRN Niu, Xilin, MD   25 mg at 06/16/24 9385   insulin  aspart (novoLOG ) injection 0-9 Units  0-9 Units Subcutaneous TID WC Niu, Xilin, MD   5 Units at 06/16/24 9083   insulin  glargine-yfgn (SEMGLEE ) injection 40 Units  40 Units Subcutaneous QHS Niu, Xilin, MD   40 Units at 06/15/24 2143   linezolid  (ZYVOX ) IVPB 600 mg  600 mg Intravenous Q12H Fayette Bodily, MD 300 mL/hr at 06/16/24 0925 600 mg at 06/16/24 9074   ondansetron  (ZOFRAN ) injection 4 mg  4 mg Intravenous Q8H PRN Niu, Xilin, MD       pantoprazole  (PROTONIX ) EC tablet 40 mg  40 mg Oral Daily Niu, Xilin, MD   40 mg at 06/16/24 0912   pregabalin  (LYRICA ) capsule 75 mg  75 mg Oral BID Niu, Xilin, MD   75 mg at 06/16/24 0912    sertraline  (ZOLOFT ) tablet 100 mg  100 mg Oral Daily Niu, Xilin, MD   100 mg at 06/16/24 0911   traZODone  (DESYREL ) tablet 100 mg  100 mg Oral QHS Niu, Xilin, MD   100 mg at 06/15/24 2143   Objective: Vital signs in last 24 hours: Temp:  [98.2 F (36.8 C)-98.7 F (37.1 C)] 98.5 F (36.9 C) (07/29 0815) Pulse Rate:  [71-84] 78 (07/29 0815) Resp:  [18-20] 18 (07/29 0525) BP: (93-131)/(61-80) 131/76 (07/29 0815) SpO2:  [94 %-98 %] 98 % (07/29 0815) Weight:  [94.2 kg] 94.2 kg (07/29 0640)  Intake/Output from previous day: 07/28 0701 - 07/29 0700 In: 240 [P.O.:140; IV Piggyback:100] Out: 1450 [Urine:1450] Intake/Output this shift: No intake/output data recorded.  Physical Exam Vitals and nursing note reviewed.  Constitutional:      General: He is not in acute distress.    Appearance: He is not ill-appearing, toxic-appearing or diaphoretic.  HENT:     Head: Normocephalic and atraumatic.  Pulmonary:     Effort: Pulmonary effort is normal. No respiratory distress.  Genitourinary:    Comments: Diffuse scrotal erythema and edema without crepitus, fluctuance, or necrosis.  Buried penis secondary to scrotal edema.  Enlargement and tenderness of the left epididymis. Skin:    General: Skin is warm and dry.  Neurological:     Mental Status: He is alert and oriented to person, place, and time.  Psychiatric:        Mood and Affect: Mood normal.        Behavior: Behavior normal.    Lab Results:  Recent Labs    06/15/24 0503 06/16/24 0914  WBC 13.2* 13.4*  HGB 7.9* 7.6*  HCT 24.8* 23.9*  PLT 157 186   BMET Recent Labs    06/14/24 0447 06/15/24 0503  NA 133* 135  K 3.9 4.3  CL 110 108  CO2 18* 19*  GLUCOSE 196* 160*  BUN 29* 38*  CREATININE 3.02* 3.76*  CALCIUM  8.0* 8.4*   PT/INR Recent Labs    06/13/24 1936  LABPROT 15.6*  INR 1.2   Assessment & Plan: 48 y.o. male with PMH poorly controlled diabetes and CKD now admitted with scrotal cellulitis.  He is  clinically stable, reports worsening swelling  and pain.  Will repeat CT pelvis without contrast today.  Given recency of Eliquis  and no warning signs on physical exam today, will plan to defer surgery until least tomorrow.  He may have a diet.  Please continue to hold Eliquis .  Agree with antibiotics per ID.  Lucie Hones, PA-C 06/16/2024

## 2024-06-16 NOTE — Inpatient Diabetes Management (Signed)
 Inpatient Diabetes Program Recommendations  AACE/ADA: New Consensus Statement on Inpatient Glycemic Control (2015)  Target Ranges:  Prepandial:   less than 140 mg/dL      Peak postprandial:   less than 180 mg/dL (1-2 hours)      Critically ill patients:  140 - 180 mg/dL   Lab Results  Component Value Date   GLUCAP 293 (H) 06/16/2024   HGBA1C 9.2 (H) 05/25/2024    Latest Reference Range & Units 06/15/24 08:20 06/15/24 11:53 06/15/24 16:49 06/15/24 21:46 06/16/24 08:12  Glucose-Capillary 70 - 99 mg/dL 832 (H) 808 (H) 823 (H) 301 (H) 293 (H)  (H): Data is abnormally high  Diabetes history: DM2 Outpatient Diabetes medications: Lantus  60, Nov 10-16 tid  Current orders for Inpatient glycemic control: Semglee  40 units daily, Novolog  0-9 units tid, 0-5 hs correction   Inpatient Diabetes Program Recommendations:   Please consider: -Increase Semglee  to 45 units daily -Add Novolog  0-5 units correction hs -Add Novolog  4 units tid meal coverage when eating 50% (currently NPO)  Thank you, Dagoberto E. Jodette Wik, RN, MSN, CDCES  Diabetes Coordinator Inpatient Glycemic Control Team Team Pager [phone] (8am-5pm) 06/16/2024 10:08 AM

## 2024-06-16 NOTE — Progress Notes (Signed)
 Progress Note   Patient: Richard Mendoza FMW:[id number] DOB: 03-16-1976 DOA: 06/13/2024     3 DOS: the patient was seen and examined on 06/16/2024   Brief hospital course:  Richard Mendoza is a 48 y.o. male with medical history significant of status post left AKA, PVD on Eliquis , DM-type I on insulin  pump, hypertension, chronic pain syndrome, CKD-3a, HLD, tobacco abuse, who presents with scrotal pain and fever.  Pt state that 3 days ago he had some irritation in his perineal area that he thought may be related to an ingrown hair.  Ever since then, he has developed pain and swelling in the left scrotal area, as well as of his penile foreskin.  The pain is constant, aching, severe, nonradiating, not aggravated or alleviated by any known factors. He has difficulty retracting his foreskin with difficult urinating, but still able to void.  He developed fever and chills.  His temperature is 101.2 in the ED.  Patient does not have chest pain, cough, SOB.  No nausea, vomiting, diarrhea or abdominal pain.   Data reviewed independently and ED Course: pt was found to have WBC 13.7, worsening renal function, lactic acid 2.2 --> 2.0, temperature 101.2, blood pressure 174/114, heart rate 120-130, RR 16, oxygen saturation 100% on room air.  Patient is admitted to telemetry bed as inpatient.  ED physician discussed with Dr. Nieves of urology.   CT of abdomen/pelvis: 1. Diffuse scrotal wall thickening and inflammatory changes in the left perineum. No drainable fluid collection or abscess. No soft tissue gas. 2.  Aortic Atherosclerosis (ICD10-I70.0).   EKG: I have personally reviewed.  Sinus rhythm, QTc 482, bifascicular block      Assessment and Plan:   Sepsis due to cellulitis of scrotum:   Patient met criteria for sepsis on admission with WBC 13.7, temperature 101.2, heart rate up to 130s.  Lactic acid 2.2 -> 2.0.  ED patient discussed with Dr. Nieves of urology.  Recommended reconsultation  as needed Patient seen in consultation by Dr Francisca 07/28, agrees with continuing IV antibiotic therapy and recommends to hold patient's Eliquis  for possible procedure. Vancomycin  and Zosyn  were discontinued due to worsening renal function  Patient is currently on linezolid  and Zosyn  per ID recommendation Repeat CT scan of the pelvis without contrast showed worsened scrotal swelling with findings concerning for developing abscess in the inferior left scrotum. No soft tissue gas. Appreciate urology input Will continue to hold Eliquis  for possible procedure    Essential hypertension Lasix  and losartan are on hold  due to worsening renal function Blood pressure stable      Acute kidney injury superimposed on stage IIIb chronic kidney disease Unclear etiology and may be medication induced versus postobstructive At baseline patient has a serum creatinine of 2.1>> 3.76 >> 4.55 Continue to hold losartan and Lasix   Appreciate nephrology input Obtain renal ultrasound to rule out hydronephrosis       Acute anemia superimposed on anemia of chronic kidney disease Iron  deficiency anemia Noted to have a hemoglobin of 7.9 from a baseline of 13 Most likely hemodilutional Serum iron  level is 8 Will need referral to GI as an outpatient       Acute Urinary Retention Patient with complaints of difficulty voiding Bladder scan yielded > 300cc of urine Foley catheter in place     PAD (peripheral artery disease) (HCC) Continue Eliquis , Lipitor     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   Continue  long-acting insulin  increased to 45 units daily SSI Maintain consistent carbohydrate diet   Chronic diastolic CHF (congestive heart failure) (HCC):  2D Echo on 08/28/2023 showed EF of 55-60% with grade 1 diastolic dysfunction. CHF is compensated. - Hold Lasix  and losartan due to AKI     HLD (hyperlipidemia) -Lipitor, fenofibrate       Iron  deficiency anemia, unspecified: Hemoglobin stable, 11.4 today. - Continue iron  supplement   Chronic pain syndrome -Continue home oral Dilaudid , lyrica      Depression -Continue sertraline  and trazodone      Overweight (BMI 25.0-29.9): Body weight 92 kg, BMI 29.10 - Encourage losing weight - Exercise and healthy diet             Subjective: Continues to complain of scrotal swelling and pain  Physical Exam: Vitals:   06/15/24 2353 06/16/24 0525 06/16/24 0640 06/16/24 0815  BP: 93/64 125/80  131/76  Pulse: 72 78  78  Resp: 18 18    Temp: 98.2 F (36.8 C) 98.7 F (37.1 C)  98.5 F (36.9 C)  TempSrc: Oral Oral    SpO2: 96% 98%  98%  Weight:   94.2 kg   Height:       General: Not in acute distress HEENT:       Eyes: PERRL, EOMI, no jaundice       ENT: No discharge from the ears and nose, no pharynx injection, no tonsillar enlargement.        Neck: No JVD, no bruit, no mass felt. Heme: No neck lymph node enlargement. Cardiac: S1/S2, RRR, No murmurs, No gallops or rubs. Respiratory: No rales, wheezing, rhonchi or rubs. GI: Soft, nondistended, nontender, no rebound pain, no organomegaly, BS present. GU: has erythema, tenderness, warmth, swelling to left scrotum and penile foreskin.  No fluctuance.           Ext: No pitting leg edema bilaterally. S/p of left AKA Musculoskeletal: No joint deformities, No joint redness or warmth, no limitation of ROM in spin. Skin: No rashes.  Neuro: Alert, oriented X3, cranial nerves II-XII grossly intact, moves all extremities normally. Psych: Patient is not psychotic, no suicidal or hemocidal ideation.  Data Reviewed: White count 13.4, hemoglobin 7.6, creatinine 4.55, bicarb 17 Labs reviewed  Family Communication: Plan of care was discussed with patient in detail.  He verbalizes understanding and agrees with the plan.  Disposition: Status is: Inpatient Remains inpatient appropriate because: On IV antibiotics for  scrotal cellulitis  Planned Discharge Destination: Home    Time spent: 45 minutes  Author: Aimee Somerset, MD 06/16/2024 2:08 PM  For on call review [email or web address].

## 2024-06-16 NOTE — Progress Notes (Signed)
 Brief Urology Progress Note  Repeat pelvis CT today showed increased scrotal swelling with developing abscess in the inferior left scrotum without evidence of Fournier's gangrene.  I returned to the bedside in the afternoon and offered him scrotal I&D with Dr. Francisca tomorrow at lunchtime.  We discussed that the wound will be left open and will require dressing changes with wound packing.  We discussed his risk for delayed wound healing due to his diabetes.  We discussed general risks of surgery including infection, bleeding, and damage to surrounding structures.  He expressed understanding and wishes to proceed.  Recommendations: - N.p.o. at midnight (ordered) - Informed consent order in - Antibiotics per ID - Scrotal I&D tomorrow with Dr. Francisca Lucie Hones, PA-C 06/16/24  5:40 PM

## 2024-06-16 NOTE — Plan of Care (Signed)
  Problem: Coping: Goal: Ability to adjust to condition or change in health will improve Outcome: Progressing   Problem: Fluid Volume: Goal: Ability to maintain a balanced intake and output will improve Outcome: Progressing   Problem: Health Behavior/Discharge Planning: Goal: Ability to identify and utilize available resources and services will improve Outcome: Progressing Goal: Ability to manage health-related needs will improve Outcome: Progressing

## 2024-06-16 NOTE — Plan of Care (Signed)
  Problem: Coping: Goal: Ability to adjust to condition or change in health will improve Outcome: Progressing   Problem: Nutritional: Goal: Maintenance of adequate nutrition will improve Outcome: Progressing   Problem: Skin Integrity: Goal: Risk for impaired skin integrity will decrease Outcome: Progressing   Problem: Education: Goal: Knowledge of General Education information will improve Description: Including pain rating scale, medication(s)/side effects and non-pharmacologic comfort measures Outcome: Progressing   Problem: Activity: Goal: Risk for activity intolerance will decrease Outcome: Progressing   Problem: Pain Managment: Goal: General experience of comfort will improve and/or be controlled Outcome: Progressing   Problem: Safety: Goal: Ability to remain free from injury will improve Outcome: Progressing

## 2024-06-16 NOTE — Telephone Encounter (Signed)
 Patient was called and made aware.

## 2024-06-16 NOTE — Telephone Encounter (Signed)
 Patient shared that the pharmacy had told him yesterday that they could not get the Lantus . A prescription was sent in for the Basaglar  to the patient's pharmacy.

## 2024-06-17 ENCOUNTER — Inpatient Hospital Stay: Admitting: Registered Nurse

## 2024-06-17 ENCOUNTER — Encounter
Admission: EM | Disposition: A | Discharge status: Skilled Nursing Facility | Payer: Self-pay | Source: Home / Self Care | Attending: Internal Medicine

## 2024-06-17 DIAGNOSIS — N492 Inflammatory disorders of scrotum: Secondary | ICD-10-CM | POA: Diagnosis not present

## 2024-06-17 HISTORY — PX: INCISION AND DRAINAGE ABSCESS: SHX5864

## 2024-06-17 LAB — CBC
HCT: 24.9 % — ABNORMAL LOW (ref 39.0–52.0)
Hemoglobin: 8.1 g/dL — ABNORMAL LOW (ref 13.0–17.0)
MCH: 25.1 pg — ABNORMAL LOW (ref 26.0–34.0)
MCHC: 32.5 g/dL (ref 30.0–36.0)
MCV: 77.1 fL — ABNORMAL LOW (ref 80.0–100.0)
Platelets: 259 K/uL (ref 150–400)
RBC: 3.23 MIL/uL — ABNORMAL LOW (ref 4.22–5.81)
RDW: 17 % — ABNORMAL HIGH (ref 11.5–15.5)
WBC: 11.7 K/uL — ABNORMAL HIGH (ref 4.0–10.5)
nRBC: 0 % (ref 0.0–0.2)

## 2024-06-17 LAB — RENAL FUNCTION PANEL
Albumin: 2.3 g/dL — ABNORMAL LOW (ref 3.5–5.0)
Anion gap: 7 (ref 5–15)
BUN: 35 mg/dL — ABNORMAL HIGH (ref 6–20)
CO2: 22 mmol/L (ref 22–32)
Calcium: 8.9 mg/dL (ref 8.9–10.3)
Chloride: 110 mmol/L (ref 98–111)
Creatinine, Ser: 3.34 mg/dL — ABNORMAL HIGH (ref 0.61–1.24)
GFR, Estimated: 22 mL/min — ABNORMAL LOW (ref >60)
Glucose, Bld: 141 mg/dL — ABNORMAL HIGH (ref 70–99)
Phosphorus: 3.2 mg/dL (ref 2.5–4.6)
Potassium: 4.1 mmol/L (ref 3.5–5.1)
Sodium: 139 mmol/L (ref 135–145)

## 2024-06-17 LAB — GLUCOSE, CAPILLARY
Glucose-Capillary: 124 mg/dL — ABNORMAL HIGH (ref 70–99)
Glucose-Capillary: 92 mg/dL (ref 70–99)
Glucose-Capillary: 134 mg/dL — ABNORMAL HIGH (ref 70–99)
Glucose-Capillary: 173 mg/dL — ABNORMAL HIGH (ref 70–99)
Glucose-Capillary: 297 mg/dL — ABNORMAL HIGH (ref 70–99)

## 2024-06-17 SURGERY — INCISION AND DRAINAGE, ABSCESS
Anesthesia: General | Site: Scrotum

## 2024-06-17 MED ORDER — DEXAMETHASONE SODIUM PHOSPHATE 10 MG/ML IJ SOLN
INTRAMUSCULAR | Status: AC
  Filled 2024-06-17: qty 1

## 2024-06-17 MED ORDER — MIDAZOLAM HCL 2 MG/2ML IJ SOLN
INTRAMUSCULAR | Status: DC | PRN
Start: 2024-06-17 — End: 2024-06-17
  Administered 2024-06-17: 2 mg via INTRAVENOUS

## 2024-06-17 MED ORDER — LIDOCAINE HCL (CARDIAC) PF 100 MG/5ML IV SOSY
PREFILLED_SYRINGE | INTRAVENOUS | Status: DC | PRN
Start: 2024-06-17 — End: 2024-06-17
  Administered 2024-06-17: 70 mg via INTRAVENOUS

## 2024-06-17 MED ORDER — ACETAMINOPHEN 10 MG/ML IV SOLN
INTRAVENOUS | Status: AC
  Filled 2024-06-17: qty 100

## 2024-06-17 MED ORDER — SODIUM BICARBONATE 650 MG PO TABS
650.0000 mg | ORAL_TABLET | Freq: Two times a day (BID) | ORAL | Status: DC
  Administered 2024-06-17 – 2024-06-21 (×8): 650 mg via ORAL
  Filled 2024-06-17 (×8): qty 1

## 2024-06-17 MED ORDER — LIDOCAINE HCL (PF) 1 % IJ SOLN
INTRAMUSCULAR | Status: AC
  Filled 2024-06-17: qty 30

## 2024-06-17 MED ORDER — BUPIVACAINE HCL (PF) 0.5 % IJ SOLN
INTRAMUSCULAR | Status: AC
  Filled 2024-06-17: qty 30

## 2024-06-17 MED ORDER — PROPOFOL 10 MG/ML IV BOLUS
INTRAVENOUS | Status: AC
  Filled 2024-06-17: qty 20

## 2024-06-17 MED ORDER — ONDANSETRON HCL 4 MG/2ML IJ SOLN
INTRAMUSCULAR | Status: DC | PRN
  Administered 2024-06-17: 4 mg via INTRAVENOUS

## 2024-06-17 MED ORDER — LACTATED RINGERS IV SOLN: INTRAVENOUS | Status: DC | PRN

## 2024-06-17 MED ORDER — 0.9 % SODIUM CHLORIDE (POUR BTL) OPTIME
TOPICAL | Status: DC | PRN
  Administered 2024-06-17: 500 mL

## 2024-06-17 MED ORDER — PROPOFOL 10 MG/ML IV BOLUS
INTRAVENOUS | Status: DC | PRN
  Administered 2024-06-17: 100 mg via INTRAVENOUS

## 2024-06-17 MED ORDER — ACETAMINOPHEN 10 MG/ML IV SOLN
1000.0000 mg | Freq: Once | INTRAVENOUS | Status: DC | PRN
Start: 2024-06-17 — End: 2024-06-17

## 2024-06-17 MED ORDER — FENTANYL CITRATE (PF) 100 MCG/2ML IJ SOLN
INTRAMUSCULAR | Status: DC | PRN
  Administered 2024-06-17: 50 ug via INTRAVENOUS

## 2024-06-17 MED ORDER — ONDANSETRON HCL 4 MG/2ML IJ SOLN
INTRAMUSCULAR | Status: AC
Start: 2024-06-17 — End: 2024-06-17
  Filled 2024-06-17: qty 2

## 2024-06-17 MED ORDER — ACETAMINOPHEN 10 MG/ML IV SOLN
INTRAVENOUS | Status: DC | PRN
Start: 2024-06-17 — End: 2024-06-17
  Administered 2024-06-17: 1000 mg via INTRAVENOUS

## 2024-06-17 MED ORDER — FENTANYL CITRATE (PF) 100 MCG/2ML IJ SOLN
INTRAMUSCULAR | Status: AC
Start: 2024-06-17 — End: 2024-06-17
  Filled 2024-06-17: qty 2

## 2024-06-17 MED ORDER — LACTATED RINGERS IV SOLN: INTRAVENOUS | Status: DC

## 2024-06-17 MED ORDER — MIDAZOLAM HCL 2 MG/2ML IJ SOLN
INTRAMUSCULAR | Status: AC
  Filled 2024-06-17: qty 2

## 2024-06-17 MED ORDER — FENTANYL CITRATE (PF) 100 MCG/2ML IJ SOLN: 25.0000 ug | INTRAMUSCULAR | Status: DC | PRN

## 2024-06-17 MED ORDER — PHENYLEPHRINE 80 MCG/ML (10ML) SYRINGE FOR IV PUSH (FOR BLOOD PRESSURE SUPPORT)
PREFILLED_SYRINGE | INTRAVENOUS | Status: DC | PRN
Start: 2024-06-17 — End: 2024-06-17
  Administered 2024-06-17 (×2): 80 ug via INTRAVENOUS

## 2024-06-17 MED ORDER — DEXAMETHASONE SODIUM PHOSPHATE 10 MG/ML IJ SOLN
INTRAMUSCULAR | Status: DC | PRN
Start: 2024-06-17 — End: 2024-06-17
  Administered 2024-06-17: 5 mg via INTRAVENOUS

## 2024-06-17 SURGICAL SUPPLY — 27 items
CHLORAPREP W/TINT 26 (MISCELLANEOUS) ×1 IMPLANT
DERMABOND ADVANCED .7 DNX12 (GAUZE/BANDAGES/DRESSINGS) ×1 IMPLANT
DRAIN PENROSE 12X.25 LTX STRL (MISCELLANEOUS) ×1 IMPLANT
DRAPE LAPAROTOMY 77X122 PED (DRAPES) ×1 IMPLANT
DRSG GAUZE FLUFF 36X18 (GAUZE/BANDAGES/DRESSINGS) ×1 IMPLANT
ELECTRODE REM PT RTRN 9FT ADLT (ELECTROSURGICAL) ×1 IMPLANT
GAUZE 4X4 16PLY ~~LOC~~+RFID DBL (SPONGE) ×1 IMPLANT
GAUZE SPONGE 4X4 12PLY STRL (GAUZE/BANDAGES/DRESSINGS) ×1 IMPLANT
GAUZE STRETCH 2X75IN STRL (MISCELLANEOUS) ×1 IMPLANT
GLOVE BIO SURGEON STRL SZ 6.5 (GLOVE) ×1 IMPLANT
GLOVE BIO SURGEON STRL SZ7 (GLOVE) ×1 IMPLANT
GOWN STRL REUS W/ TWL LRG LVL3 (GOWN DISPOSABLE) ×2 IMPLANT
KIT TURNOVER KIT A (KITS) ×1 IMPLANT
LABEL OR SOLS (LABEL) ×1 IMPLANT
MANIFOLD NEPTUNE II (INSTRUMENTS) ×1 IMPLANT
NDL HYPO 25X1 1.5 SAFETY (NEEDLE) ×1 IMPLANT
NEEDLE HYPO 25X1 1.5 SAFETY (NEEDLE) ×1 IMPLANT
NS IRRIG 500ML POUR BTL (IV SOLUTION) ×1 IMPLANT
PACK BASIN MINOR ARMC (MISCELLANEOUS) ×1 IMPLANT
PAD ABD DERMACEA PRESS 5X9 (GAUZE/BANDAGES/DRESSINGS) IMPLANT
SOLUTION PREP PVP 2OZ (MISCELLANEOUS) ×1 IMPLANT
SUPPORETR ATHLETIC LG (MISCELLANEOUS) ×1 IMPLANT
SUT VIC AB 3-0 SH 27X BRD (SUTURE) ×2 IMPLANT
SUT VIC AB 4-0 SH 27XANBCTRL (SUTURE) ×1 IMPLANT
SYR 10ML LL (SYRINGE) ×1 IMPLANT
TRAP FLUID SMOKE EVACUATOR (MISCELLANEOUS) ×1 IMPLANT
WATER STERILE IRR 500ML POUR (IV SOLUTION) ×1 IMPLANT

## 2024-06-17 NOTE — Anesthesia Preprocedure Evaluation (Addendum)
 Anesthesia Evaluation  Patient identified by MRN, date of birth, ID band Patient awake    Reviewed: Allergy & Precautions, NPO status , Patient's Chart, lab work & pertinent test results  History of Anesthesia Complications Negative for: history of anesthetic complications  Airway Mallampati: II   Neck ROM: Full    Dental  (+) Edentulous Upper, Edentulous Lower   Pulmonary sleep apnea , former smoker (quit 08/2023)   Pulmonary exam normal breath sounds clear to auscultation       Cardiovascular hypertension, + Peripheral Vascular Disease (s/p L AKA on Eliquis ) and +CHF (G1DD)  Normal cardiovascular exam Rhythm:Regular Rate:Normal  ECG 06/13/24:  Sinus tachycardia Right bundle branch block LVH with IVCD and secondary repol abnrm Borderline prolonged QT interval Baseline wander in lead(s) V6  Echo 07/25/22:  Left Ventricle: Left ventricle size is normal.    Left Ventricle: EF: 55-60%.    Left Ventricle: Wall motion is normal.    Right Ventricle: Systolic function is normal.     Neuro/Psych  PSYCHIATRIC DISORDERS  Depression    Chronic pain; s/p ACDF    GI/Hepatic ,GERD  ,,  Endo/Other  diabetes, Type 2, Insulin  Dependent    Renal/GU Renal disease (AKI on stage III CKD)     Musculoskeletal   Abdominal   Peds  Hematology  (+) Blood dyscrasia (acute on chronic), anemia   Anesthesia Other Findings Sepsis 2/2 cellulitis  Reproductive/Obstetrics                              Anesthesia Physical Anesthesia Plan  ASA: 4  Anesthesia Plan: General   Post-op Pain Management:    Induction: Intravenous  PONV Risk Score and Plan: 2 and Ondansetron , Dexamethasone  and Treatment may vary due to age or medical condition  Airway Management Planned: LMA  Additional Equipment:   Intra-op Plan:   Post-operative Plan: Extubation in OR  Informed Consent: I have reviewed the patients History  and Physical, chart, labs and discussed the procedure including the risks, benefits and alternatives for the proposed anesthesia with the patient or authorized representative who has indicated his/her understanding and acceptance.     Dental advisory given  Plan Discussed with: CRNA  Anesthesia Plan Comments: (Patient consented for risks of anesthesia including but not limited to:  - adverse reactions to medications - damage to eyes, teeth, lips or other oral mucosa - nerve damage due to positioning  - sore throat or hoarseness - damage to heart, brain, nerves, lungs, other parts of body or loss of life  Informed patient about role of CRNA in peri- and intra-operative care.  Patient voiced understanding.)         Anesthesia Quick Evaluation

## 2024-06-17 NOTE — Plan of Care (Signed)
  Problem: Metabolic: Goal: Ability to maintain appropriate glucose levels will improve Outcome: Progressing   Problem: Nutritional: Goal: Maintenance of adequate nutrition will improve Outcome: Progressing   Problem: Skin Integrity: Goal: Risk for impaired skin integrity will decrease Outcome: Progressing   Problem: Tissue Perfusion: Goal: Adequacy of tissue perfusion will improve Outcome: Progressing   

## 2024-06-17 NOTE — Plan of Care (Signed)
  Problem: Coping: Goal: Ability to adjust to condition or change in health will improve Outcome: Progressing   Problem: Health Behavior/Discharge Planning: Goal: Ability to identify and utilize available resources and services will improve Outcome: Progressing Goal: Ability to manage health-related needs will improve Outcome: Progressing   Problem: Nutritional: Goal: Maintenance of adequate nutrition will improve Outcome: Progressing Goal: Progress toward achieving an optimal weight will improve Outcome: Progressing

## 2024-06-17 NOTE — Progress Notes (Signed)
 Progress Note   Patient: Richard Mendoza FMW:[id number] DOB: 14-Jan-1976 DOA: 06/13/2024     4 DOS: the patient was seen and examined on 06/17/2024   Brief hospital course:  Richard Mendoza is a 48 y.o. male with medical history significant of status post left AKA, PVD on Eliquis , DM-type I on insulin  pump, hypertension, chronic pain syndrome, CKD-3a, HLD, tobacco abuse, who presents with scrotal pain and fever.  Pt state that 3 days ago he had some irritation in his perineal area that he thought may be related to an ingrown hair.  Ever since then, he has developed pain and swelling in the left scrotal area, as well as of his penile foreskin.  The pain is constant, aching, severe, nonradiating, not aggravated or alleviated by any known factors. He has difficulty retracting his foreskin with difficult urinating, but still able to void.  He developed fever and chills.  His temperature is 101.2 in the ED.  Patient does not have chest pain, cough, SOB.  No nausea, vomiting, diarrhea or abdominal pain.   Data reviewed independently and ED Course: pt was found to have WBC 13.7, worsening renal function, lactic acid 2.2 --> 2.0, temperature 101.2, blood pressure 174/114, heart rate 120-130, RR 16, oxygen saturation 100% on room air.  Patient is admitted to telemetry bed as inpatient.  ED physician discussed with Dr. Nieves of urology.   CT of abdomen/pelvis: 1. Diffuse scrotal wall thickening and inflammatory changes in the left perineum. No drainable fluid collection or abscess. No soft tissue gas. 2.  Aortic Atherosclerosis (ICD10-I70.0).   EKG: I have personally reviewed.  Sinus rhythm, QTc 482, bifascicular block      Assessment and Plan:  Sepsis due to cellulitis of scrotum:   Patient met criteria for sepsis on admission with WBC 13.7, temperature 101.2, heart rate up to 130s.  Lactic acid 2.2 -> 2.0.  ED patient discussed with Dr. Nieves of urology.  Recommended reconsultation  as needed Patient seen in consultation by Dr Francisca 07/28, agrees with continuing IV antibiotic therapy and recommends to hold patient's Eliquis  for possible procedure. Vancomycin  and Zosyn  were discontinued due to worsening renal function  Patient is currently on linezolid  and Zosyn  per ID recommendation Repeat CT scan of the pelvis without contrast showed worsened scrotal swelling with findings concerning for developing abscess in the inferior left scrotum. No soft tissue gas. Appreciate urology input, patient is status post incision and drainage of scrotal abscess Continue local wound care.Urology will perform first dressing change tomorrow morning, continue wet-to-dry wound care twice daily  Follow-up results of cultures    Essential hypertension Lasix  and losartan are on hold due to worsening renal function Blood pressure is stable       Acute kidney injury superimposed on stage IIIb chronic kidney disease Unclear etiology and may be medication induced versus postobstructive At baseline patient has a serum creatinine of 2.1>> 3.76 >> 4.55 Continue to hold losartan and Lasix   Appreciate nephrology input Renal ultrasound is negative for hydronephrosis       Acute anemia superimposed on anemia of chronic kidney disease Iron  deficiency anemia Noted to have a hemoglobin of 7.9 from a baseline of 13 Most likely hemodilutional Serum iron  level is 8 Will need referral to GI as an outpatient       Acute Urinary Retention Patient with complaints of difficulty voiding Bladder scan yielded > 300cc of urine Foley catheter in place Voiding trial prior to discharge     PAD (  peripheral artery disease) (HCC) Continue Eliquis , Lipitor     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   Continue long-acting insulin  increased to 45 units daily SSI Maintain consistent carbohydrate diet   Chronic diastolic CHF (congestive heart  failure) (HCC):  2D Echo on 08/28/2023 showed EF of 55-60% with grade 1 diastolic dysfunction. CHF is compensated. - Hold Lasix  and losartan due to AKI     HLD (hyperlipidemia) -Lipitor, fenofibrate      Iron  deficiency anemia, unspecified: Hemoglobin stable, 11.4 today. - Continue iron  supplement   Chronic pain syndrome -Continue home oral Dilaudid , lyrica      Depression -Continue sertraline  and trazodone      Overweight (BMI 25.0-29.9): Body weight 92 kg, BMI 29.10 - Encourage losing weight - Exercise and healthy diet            Subjective: No new complaints.  Scheduled for incision and drainage of scrotal abscess  Physical Exam: Vitals:   06/17/24 1315 06/17/24 1319 06/17/24 1323 06/17/24 1330  BP: 131/76   127/72  Pulse: (!) 56 (!) 58 (!) 57 60  Resp: 13 (!) 9 12 13   Temp:    (!) 97 F (36.1 C)  TempSrc:      SpO2: 100% 100% 100% 98%  Weight:      Height:       General: Not in acute distress HEENT:       Eyes: PERRL, EOMI, no jaundice       ENT: No discharge from the ears and nose, no pharynx injection, no tonsillar enlargement.        Neck: No JVD, no bruit, no mass felt. Heme: No neck lymph node enlargement. Cardiac: S1/S2, RRR, No murmurs, No gallops or rubs. Respiratory: No rales, wheezing, rhonchi or rubs. GI: Soft, nondistended, nontender, no rebound pain, no organomegaly, BS present. GU: has erythema, tenderness, warmth, swelling to left scrotum and penile foreskin.  No fluctuance.           Ext: No pitting leg edema bilaterally. S/p of left AKA Musculoskeletal: No joint deformities, No joint redness or warmth, no limitation of ROM in spin. Skin: No rashes.  Neuro: Alert, oriented X3, cranial nerves II-XII grossly intact, moves all extremities normally. Psych: Patient is not psychotic, no suicidal or hemocidal ideation.     Data Reviewed: Creatinine 3.34 down from 4.551, white count 11.7, hemoglobin 8.1 Labs reviewed  Family  Communication: Plan of care discussed with the patient and his mother at the bedside.  All questions and concerns have been addressed.  They verbalized understanding and agree with the plan.   Disposition: Status is: Inpatient Remains inpatient appropriate because: Remains on IV antibiotics for scrotal abscess  Planned Discharge Destination: Home    Time spent: 40 minutes  Author: Aimee Somerset, MD 06/17/2024 2:40 PM  For on call review [email or web address].

## 2024-06-17 NOTE — Interval H&P Note (Signed)
 UROLOGY H&P UPDATE  Persistent scrotal pain and swelling, CT yesterday suggestive of scrotal abscess.  Will proceed to OR for incision and drainage.  Cardiac: RRR Lungs: CTA bilaterally  Laterality: n/a Procedure: Incision and drainage of scrotal abscess   Informed consent obtained, we specifically discussed the risks of bleeding, infection, post-operative pain, delayed healing requiring packing and wound care, need for additional procedures.  Redell JAYSON Burnet, MD 06/17/2024

## 2024-06-17 NOTE — Transfer of Care (Signed)
 Immediate Anesthesia Transfer of Care Note  Patient: Richard Mendoza  Procedure(s) Performed: INCISION AND DRAINAGE, SCROTAL ABSCESS (Scrotum)  Patient Location: PACU  Anesthesia Type:General  Level of Consciousness: drowsy and patient cooperative  Airway & Oxygen Therapy: Patient Spontanous Breathing and Patient connected to face mask oxygen  Post-op Assessment: Report given to RN and Post -op Vital signs reviewed and stable  Post vital signs: Reviewed and stable  Last Vitals:  Vitals Value Taken Time  BP 105/66 06/17/24 12:32  Temp    Pulse 58 06/17/24 12:34  Resp 12 06/17/24 12:34  SpO2 100 % 06/17/24 12:34  Vitals shown include unfiled device data.  Last Pain:  Vitals:   06/17/24 1105  TempSrc: Temporal  PainSc: 9          Complications: No notable events documented.

## 2024-06-17 NOTE — Anesthesia Procedure Notes (Signed)
 Procedure Name: LMA Insertion Date/Time: 06/17/2024 12:05 PM  Performed by: Lacretia Camelia NOVAK, CRNAPre-anesthesia Checklist: Patient identified, Emergency Drugs available, Suction available and Patient being monitored Patient Re-evaluated:Patient Re-evaluated prior to induction Preoxygenation: Pre-oxygenation with 100% oxygen Induction Type: IV induction LMA: LMA inserted LMA Size: 4.0 Number of attempts: 1 Placement Confirmation: positive ETCO2 and breath sounds checked- equal and bilateral Tube secured with: Tape Dental Injury: Teeth and Oropharynx as per pre-operative assessment

## 2024-06-17 NOTE — Progress Notes (Signed)
 [facility]  ROUNDING NOTE   Subjective:   Patient seen postprocedure, I&D of scrotal abscess Reports some soreness  Creatinine 3.34  Objective:  Vital signs in last 24 hours:  Temp:  [97 F (36.1 C)-98.4 F (36.9 C)] 97 F (36.1 C) (07/30 1330) Pulse Rate:  [55-85] 60 (07/30 1330) Resp:  [9-18] 13 (07/30 1330) BP: (105-180)/(66-93) 127/72 (07/30 1330) SpO2:  [97 %-100 %] 98 % (07/30 1330) Weight:  [94.6 kg] 94.6 kg (07/30 0542)  Weight change: 0.4 kg Filed Weights   06/15/24 0500 06/16/24 0640 06/17/24 0542  Weight: 98.9 kg 94.2 kg 94.6 kg    Intake/Output: I/O last 3 completed shifts: In: 500 [IV Piggyback:500] Out: 3950 [Urine:3950]   Intake/Output this shift:  Total I/O In: 100 [IV Piggyback:100] Out: 1210 [Urine:1200; Blood:10]  Physical Exam: General: NAD  Head: Normocephalic, atraumatic. Moist oral mucosal membranes  Eyes: Anicteric  Neck: Supple  Lungs:  Clear to auscultation  Heart: Regular rate and rhythm  Abdomen:  Soft, nontender  Extremities:  No peripheral edema.Lt AKA  Neurologic: Awake, alert, conversant  Skin: Warm,dry, no rash       Basic Metabolic Panel: Recent Labs  Lab 06/13/24 1532 06/14/24 0447 06/15/24 0503 06/16/24 0914 06/17/24 0817  NA 134* 133* 135 131* 139  K 4.0 3.9 4.3 4.6 4.1  CL 100 110 108 104 110  CO2 21* 18* 19* 17* 22  GLUCOSE 350* 196* 160* 321* 141*  BUN 31* 29* 38* 45* 35*  CREATININE 3.16* 3.02* 3.76* 4.55* 3.34*  CALCIUM  9.1 8.0* 8.4* 8.5* 8.9  PHOS  --   --   --  3.6 3.2    Liver Function Tests: Recent Labs  Lab 06/13/24 1532 06/16/24 0912 06/16/24 0914 06/17/24 0817  AST 16 14*  --   --   ALT 13 10  --   --   ALKPHOS 101 79  --   --   BILITOT 0.8 0.5  --   --   PROT 7.4 5.4*  --   --   ALBUMIN 3.4* 2.0* 2.3* 2.3*   No results for input(s): LIPASE, AMYLASE in the last 168 hours. No results for input(s): AMMONIA in the last 168 hours.  CBC: Recent Labs  Lab  06/13/24 1532 06/14/24 0447 06/15/24 0503 06/16/24 0914 06/17/24 1028  WBC 13.7* 15.7* 13.2* 13.4* 11.7*  NEUTROABS 10.8*  --   --   --   --   HGB 11.4* 8.8* 7.9* 7.6* 8.1*  HCT 35.4* 26.8* 24.8* 23.9* 24.9*  MCV 78.1* 77.9* 79.0* 77.6* 77.1*  PLT 257 188 157 186 259    Cardiac Enzymes: No results for input(s): CKTOTAL, CKMB, CKMBINDEX, TROPONINI in the last 168 hours.  BNP: Invalid input(s): POCBNP  CBG: Recent Labs  Lab 06/16/24 1651 06/16/24 2143 06/17/24 0830 06/17/24 1115 06/17/24 1249  GLUCAP 224* 159* 124* 92 134*    Microbiology: Results for orders placed or performed during the hospital encounter of 06/13/24  Blood Culture (routine x 2)     Status: None (Preliminary result)   Collection Time: 06/13/24  3:32 PM   Specimen: BLOOD RIGHT ARM  Result Value Ref Range Status   Specimen Description BLOOD RIGHT ARM  Final   Special Requests   Final    BOTTLES DRAWN AEROBIC AND ANAEROBIC Blood Culture results may not be optimal due to an inadequate volume of blood received in culture bottles   Culture   Final    NO GROWTH 4 DAYS Performed  at [facility], [address] [place], [place] [address]    Report Status PENDING  Incomplete  Blood Culture (routine x 2)     Status: None (Preliminary result)   Collection Time: 06/13/24  4:37 PM   Specimen: BLOOD  Result Value Ref Range Status   Specimen Description BLOOD BLOOD LEFT FOREARM  Final   Special Requests   Final    BOTTLES DRAWN AEROBIC AND ANAEROBIC Blood Culture results may not be optimal due to an inadequate volume of blood received in culture bottles   Culture   Final    NO GROWTH 4 DAYS Performed at [facility], [address] [place], [place] [address]    Report Status PENDING  Incomplete    Coagulation Studies: No results for input(s): LABPROT, INR in the last 72 hours.   Urinalysis: No results for input(s): COLORURINE, LABSPEC, PHURINE, GLUCOSEU,  HGBUR, BILIRUBINUR, KETONESUR, PROTEINUR, UROBILINOGEN, NITRITE, LEUKOCYTESUR in the last 72 hours.  Invalid input(s): APPERANCEUR     Imaging: US  RENAL Result Date: 06/16/2024 CLINICAL DATA:  Acute kidney injury EXAM: RENAL / URINARY TRACT ULTRASOUND COMPLETE COMPARISON:  None Available. FINDINGS: Right Kidney: Renal measurements: 12.6 x 5.9 x 6.8 cm = volume: 264 mL. Echogenicity within normal limits. No mass or hydronephrosis visualized. Left Kidney: Renal measurements: 12.9 x 6.8 x 5.9 cm = volume: 271 mL. Echogenicity within normal limits. There is a simple cyst in the upper pole measuring 5 x 4 x 6 mm. No mass or hydronephrosis visualized. Bladder: Decompressed by Foley catheter. Other: None. IMPRESSION: 1. No hydronephrosis. 2. Small simple cyst in the upper pole of the left kidney. Electronically Signed   By: Greig Pique M.D.   On: 06/16/2024 16:57   CT PELVIS WO CONTRAST Result Date: 06/16/2024 CLINICAL DATA:  Scrotal swelling and edema. EXAM: CT PELVIS WITHOUT CONTRAST TECHNIQUE: Multidetector CT imaging of the pelvis was performed following the standard protocol without intravenous contrast. RADIATION DOSE REDUCTION: This exam was performed according to the departmental dose-optimization program which includes automated exposure control, adjustment of the mA and/or kV according to patient size and/or use of iterative reconstruction technique. COMPARISON:  CT dated 06/13/2024. FINDINGS: Evaluation of this exam is limited in the absence of intravenous contrast. Urinary Tract: The urinary bladder is decompressed around a Foley catheter. Bowel:  No dilated bowel loops noted in the pelvis. Vascular/Lymphatic: Mild aortoiliac atherosclerotic disease. Mildly enlarged left external iliac chain lymph node measures 11 mm short axis. Reproductive: The prostate gland is grossly unremarkable. There is diffuse scrotal and perineal swelling and edema, progressed since the prior CT. Faint 3.7  x 1.6 cm ovoid area in the inferior left scrotal wall (64/2) may represent a developing abscess. Ultrasound may provide better evaluation if the patient is able to tolerate. No soft tissue gas. Other:  None Musculoskeletal: Degenerative changes.  No acute osseous pathology. IMPRESSION: 1. Worsened scrotal swelling with findings concerning for developing abscess in the inferior left scrotum. No soft tissue gas. 2.  Aortic Atherosclerosis (ICD10-I70.0). Electronically Signed   By: Vanetta Chou M.D.   On: 06/16/2024 13:11     Medications:    linezolid  (ZYVOX ) IV Stopped (06/17/24 1407)   piperacillin -tazobactam (ZOSYN )  IV 3.375 g (06/17/24 1439)    ascorbic acid   500 mg Oral Daily   atorvastatin   40 mg Oral Daily   Chlorhexidine  Gluconate Cloth  6 each Topical Daily   cyanocobalamin   1,000 mcg Oral Daily   famotidine   10 mg Oral  BID   feeding supplement (GLUCERNA SHAKE)  237 mL Oral TID BM   fenofibrate   54 mg Oral Daily   ferrous sulfate   325 mg Oral Q breakfast   insulin  aspart  0-9 Units Subcutaneous TID WC   insulin  glargine-yfgn  45 Units Subcutaneous QHS   pantoprazole   40 mg Oral Daily   pregabalin   75 mg Oral BID   sertraline   100 mg Oral Daily   sodium bicarbonate   650 mg Oral TID   traZODone   100 mg Oral QHS   acetaminophen , HYDROmorphone  (DILAUDID ) injection, hydrOXYzine , ondansetron  (ZOFRAN ) IV  Assessment/ Plan:  Mr. Orland Visconti Hou is a 49 y.o.  male with past medical conditions including PVD on Eliquis , type 1 diabetes with insulin  pump, hypertension, hyperlipidemia, left AKA, CKD stage III B,, who was admitted to [facility] on 06/13/2024 for Cellulitis of perineum [L03.315] Cellulitis of scrotum [N49.2]   Acute kidney injury on chronic kidney disease stage IIIb.  Baseline creatinine appears to be 2.09 with GFR 39 on 02/05/2024.  Acute kidney injury likely secondary to infection.  Creatinine 3.16 on admission.  CT pelvis negative for obstruction.  No IV contrast  exposure.  No acute indication for dialysis.  Creatinine has improved.  Patient encouraged to maintain oral intake.  No acute indication for dialysis.  Will monitor for now.  Lab Results  Component Value Date   CREATININE 3.34 (H) 06/17/2024   CREATININE 4.55 (H) 06/16/2024   CREATININE 3.76 (H) 06/15/2024    Intake/Output Summary (Last 24 hours) at 06/17/2024 1450 Last data filed at 06/17/2024 1323 Gross per 24 hour  Intake 500 ml  Output 2460 ml  Net -1960 ml    2. Anemia of chronic kidney disease Lab Results  Component Value Date   HGB 8.1 (L) 06/17/2024    Hgb remains decreased but has improved.  Will continue to monitor and assess need for ESA.  3. Acute metabolic acidosis, likely secondary to kidney injury.  S bicarb has corrected to 22 with oral supplementation.  Will decrease supplementation to twice daily.   4. Diabetes mellitus type II with chronic kidney disease/renal manifestations: insulin  dependent. Home regimen includes NovoLog  and Lantus . Most recent hemoglobin A1c is 9.2 on 05/25/2024.   Glucose well-managed.   LOS: 4 Siddhanth Denk 7/30/20252:50 PM

## 2024-06-17 NOTE — Op Note (Signed)
 Date of procedure: 06/17/24  Preoperative diagnosis:  Scrotal abscess  Postoperative diagnosis:  Same  Procedure: Incision and drainage of 4 cm scrotal abscess  Surgeon: Redell Burnet, MD  Anesthesia: General  Complications: None  Intraoperative findings:  Posterior scrotal abscess, approximately 4 cm, purulent drainage.  Testicles not involved. Wound packed tightly with Kerlix  EBL: 20 mL  Specimens: Abscess fluid and tissue for culture  Drains: 16 French Foley  Indication: Richard Mendoza is a 48 y.o. patient with scrotal cellulitis that progressed to scrotal abscess, Eliquis  has been held and here today for scrotal abscess incision and drainage.  After reviewing the management options for treatment, they elected to proceed with the above surgical procedure(s). We have discussed the potential benefits and risks of the procedure, side effects of the proposed treatment, the likelihood of the patient achieving the goals of the procedure, and any potential problems that might occur during the procedure or recuperation. Informed consent has been obtained.  Description of procedure:  The patient was taken to the operating room and general anesthesia was induced. SCD placed on the right leg for DVT prophylaxis. The patient was placed in the dorsal lithotomy position, prepped and draped in the usual sterile fashion, and preoperative antibiotics(linezolid ) were administered.  Extra care was taken padding with the left above-knee amputation extremity.  A preoperative time-out was performed.   On exam, there was a 4 cm area of induration and fluctuance in the posterior scrotum/perineum.  This was posterior to the testicles.  A scalpel was used to make a 4 cm incision vertically from the base of the scrotum toward the perineum.  Frankly purulent material was expressed.  This was further opened and bluntly dissected and this tracked slightly up to the left, as well as posteriorly.  All  loculations were broken up and the wound was irrigated copiously.  Meticulous hemostasis was achieved especially at the skin edges.  A small 2 cm x 2 cm area of necrotic tissue was excised and sent for culture.  No additional purulence or necrotic appearing tissue was noted.  The wound was tightly packed with 2 inch Kerlix, ABD pad, fluffs, and mesh jockstrap were applied.  Disposition: Stable to PACU  Plan: Urology will perform first dressing change tomorrow morning, continue wet-to-dry wound care twice daily Continue antibiotics and follow cultures  Redell Burnet, MD

## 2024-06-18 ENCOUNTER — Encounter: Payer: Self-pay | Admitting: Urology

## 2024-06-18 DIAGNOSIS — B961 Klebsiella pneumoniae [K. pneumoniae] as the cause of diseases classified elsewhere: Secondary | ICD-10-CM

## 2024-06-18 DIAGNOSIS — N492 Inflammatory disorders of scrotum: Secondary | ICD-10-CM | POA: Diagnosis not present

## 2024-06-18 DIAGNOSIS — F32A Depression, unspecified: Secondary | ICD-10-CM

## 2024-06-18 DIAGNOSIS — E1165 Type 2 diabetes mellitus with hyperglycemia: Secondary | ICD-10-CM | POA: Diagnosis not present

## 2024-06-18 DIAGNOSIS — N189 Chronic kidney disease, unspecified: Secondary | ICD-10-CM

## 2024-06-18 DIAGNOSIS — D649 Anemia, unspecified: Secondary | ICD-10-CM

## 2024-06-18 DIAGNOSIS — F419 Anxiety disorder, unspecified: Secondary | ICD-10-CM

## 2024-06-18 DIAGNOSIS — N179 Acute kidney failure, unspecified: Secondary | ICD-10-CM | POA: Diagnosis not present

## 2024-06-18 LAB — BASIC METABOLIC PANEL WITH GFR
Anion gap: 11 (ref 5–15)
BUN: 35 mg/dL — ABNORMAL HIGH (ref 6–20)
CO2: 20 mmol/L — ABNORMAL LOW (ref 22–32)
Calcium: 9.1 mg/dL (ref 8.9–10.3)
Chloride: 106 mmol/L (ref 98–111)
Creatinine, Ser: 2.95 mg/dL — ABNORMAL HIGH (ref 0.61–1.24)
GFR, Estimated: 25 mL/min — ABNORMAL LOW (ref >60)
Glucose, Bld: 337 mg/dL — ABNORMAL HIGH (ref 70–99)
Potassium: 4.3 mmol/L (ref 3.5–5.1)
Sodium: 137 mmol/L (ref 135–145)

## 2024-06-18 LAB — GLUCOSE, CAPILLARY
Glucose-Capillary: 330 mg/dL — ABNORMAL HIGH (ref 70–99)
Glucose-Capillary: 342 mg/dL — ABNORMAL HIGH (ref 70–99)
Glucose-Capillary: 289 mg/dL — ABNORMAL HIGH (ref 70–99)
Glucose-Capillary: 211 mg/dL — ABNORMAL HIGH (ref 70–99)

## 2024-06-18 LAB — CBC
HCT: 27.8 % — ABNORMAL LOW (ref 39.0–52.0)
Hemoglobin: 8.8 g/dL — ABNORMAL LOW (ref 13.0–17.0)
MCH: 24.5 pg — ABNORMAL LOW (ref 26.0–34.0)
MCHC: 31.7 g/dL (ref 30.0–36.0)
MCV: 77.4 fL — ABNORMAL LOW (ref 80.0–100.0)
Platelets: 302 K/uL (ref 150–400)
RBC: 3.59 MIL/uL — ABNORMAL LOW (ref 4.22–5.81)
RDW: 16.9 % — ABNORMAL HIGH (ref 11.5–15.5)
WBC: 11.3 K/uL — ABNORMAL HIGH (ref 4.0–10.5)
nRBC: 0 % (ref 0.0–0.2)

## 2024-06-18 LAB — CULTURE, BLOOD (ROUTINE X 2)
Culture: NO GROWTH
Culture: NO GROWTH

## 2024-06-18 MED ORDER — INSULIN ASPART 100 UNIT/ML IJ SOLN
5.0000 [IU] | Freq: Three times a day (TID) | INTRAMUSCULAR | Status: DC
  Administered 2024-06-18 – 2024-06-23 (×13): 5 [IU] via SUBCUTANEOUS
  Filled 2024-06-18 (×12): qty 1

## 2024-06-18 MED ORDER — HYDROMORPHONE HCL 1 MG/ML IJ SOLN
1.0000 mg | Freq: Once | INTRAMUSCULAR | Status: AC
  Administered 2024-06-18: 1 mg via INTRAVENOUS

## 2024-06-18 MED ORDER — BENAZEPRIL HCL 20 MG PO TABS
40.0000 mg | ORAL_TABLET | Freq: Every day | ORAL | Status: DC
  Administered 2024-06-18: 40 mg via ORAL
  Filled 2024-06-18: qty 2

## 2024-06-18 MED ORDER — HYDROMORPHONE HCL 1 MG/ML IJ SOLN: 1.0000 mg | Freq: Once | INTRAMUSCULAR | Status: DC

## 2024-06-18 MED ORDER — AMLODIPINE BESYLATE 10 MG PO TABS
10.0000 mg | ORAL_TABLET | Freq: Every day | ORAL | Status: DC
  Administered 2024-06-18 – 2024-06-25 (×8): 10 mg via ORAL
  Filled 2024-06-18 (×8): qty 1

## 2024-06-18 MED ORDER — HYDROMORPHONE HCL 1 MG/ML IJ SOLN
1.0000 mg | Freq: Once | INTRAMUSCULAR | Status: AC
  Administered 2024-06-18: 1 mg via INTRAVENOUS
  Filled 2024-06-18: qty 1

## 2024-06-18 MED ORDER — HYDROMORPHONE HCL 1 MG/ML IJ SOLN
1.0000 mg | INTRAMUSCULAR | Status: DC | PRN
  Administered 2024-06-18 – 2024-06-19 (×4): 1 mg via INTRAVENOUS
  Filled 2024-06-18 (×6): qty 1

## 2024-06-18 NOTE — Progress Notes (Addendum)
 Date of Admission:  06/13/2024      ID: Richard Mendoza is a 48 y.o. 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)   Chronic diastolic CHF (congestive heart failure) (HCC)   Depression   Acute renal failure superimposed on stage 3b chronic kidney disease (HCC)   Cellulitis of perineum   Scrotal abscess    Subjective: Doing better after I/D yesterday No fever Pain better  Medications:   amLODipine   10 mg Oral Daily   ascorbic acid   500 mg Oral Daily   atorvastatin   40 mg Oral Daily   Chlorhexidine  Gluconate Cloth  6 each Topical Daily   cyanocobalamin   1,000 mcg Oral Daily   famotidine   10 mg Oral BID   feeding supplement (GLUCERNA SHAKE)  237 mL Oral TID BM   fenofibrate   54 mg Oral Daily   ferrous sulfate   325 mg Oral Q breakfast   insulin  aspart  0-9 Units Subcutaneous TID WC   insulin  glargine-yfgn  45 Units Subcutaneous QHS   pantoprazole   40 mg Oral Daily   pregabalin   75 mg Oral BID   sertraline   100 mg Oral Daily   sodium bicarbonate   650 mg Oral BID   traZODone   100 mg Oral QHS    Objective: Vital signs in last 24 hours: Patient Vitals for the past 24 hrs:  BP Temp Temp src Pulse Resp SpO2 Weight  06/18/24 1226 (!) 158/79 97.6 F (36.4 C) -- 69 18 99 % --  06/18/24 0833 (!) 186/90 97.6 F (36.4 C) -- 78 18 99 % --  06/18/24 0548 (!) 216/102 (!) 97.4 F (36.3 C) Oral 71 18 96 % --  06/18/24 0500 -- -- -- -- -- -- 89.6 kg  06/17/24 2336 (!) 186/88 (!) 97.5 F (36.4 C) -- 67 18 98 % --  06/17/24 2026 (!) 164/79 97.9 F (36.6 C) Oral 64 18 100 % --  06/17/24 1604 137/74 97.7 F (36.5 C) -- (!) 59 20 95 % --     Lines and Device Date on insertion # of days DC  Midwife     ETT       PHYSICAL EXAM:  General: Alert, cooperative, no distress, appears stated age.  Lungs: Clear to auscultation bilaterally. No Wheezing or Rhonchi. No rales. Heart: Regular rate and rhythm, no murmur, rub or gallop. Abdomen: Soft, non-tender,not distended. Bowel sounds normal. No masses scrotum dressing not removed  Extremities: left AKA Skin: No rashes or lesions. Or bruising Lymph: Cervical, supraclavicular normal. Neurologic: Grossly non-focal  Lab Results    Latest Ref Rng & Units 06/18/2024    9:51 AM 06/17/2024   10:28 AM 06/16/2024    9:14 AM  CBC  WBC 4.0 - 10.5 K/uL 11.3  11.7  13.4   Hemoglobin 13.0 - 17.0 g/dL 8.8  8.1  7.6   Hematocrit 39.0 - 52.0 % 27.8  24.9  23.9   Platelets 150 - 400 K/uL 302  259  186        Latest Ref Rng & Units 06/18/2024    9:02 AM 06/17/2024    8:17 AM 06/16/2024    9:14 AM  CMP  Glucose 70 - 99 mg/dL 662  858  678   BUN 6 - 20 mg/dL 35  35  45   Creatinine 0.61 - 1.24 mg/dL 7.04  6.65  5.44   Sodium 135 - 145 mmol/L 137  139  131   Potassium 3.5 - 5.1 mmol/L 4.3  4.1  4.6   Chloride 98 - 111 mmol/L 106  110  104   CO2 22 - 32 mmol/L 20  22  17    Calcium  8.9 - 10.3 mg/dL 9.1  8.9  8.5       Microbiology: BC- NG Studies/Results: US  RENAL Result Date: 06/16/2024 CLINICAL DATA:  Acute kidney injury EXAM: RENAL / URINARY TRACT ULTRASOUND COMPLETE COMPARISON:  None Available. FINDINGS: Right Kidney: Renal measurements: 12.6 x 5.9 x 6.8 cm = volume: 264 mL. Echogenicity within normal limits. No mass or hydronephrosis visualized. Left Kidney: Renal measurements: 12.9 x 6.8 x 5.9 cm = volume: 271 mL. Echogenicity within normal limits. There is a simple cyst in the upper pole measuring 5 x 4 x 6 mm. No mass or hydronephrosis visualized. Bladder: Decompressed by Foley catheter. Other: None. IMPRESSION: 1. No hydronephrosis. 2. Small simple cyst in the upper pole of the left kidney. Electronically Signed    By: Greig Pique M.D.   On: 06/16/2024 16:57     Assessment/Plan: Scrotal wall cellulitis and soft tissue infection-now with development scrotal abscess on the left inferior S/p I/D culture pending ( so far klebsiella) He has h/o MRSA/staph infection- Currently on zosyn   and linezolid     AKI on CKD- improving Combination of infection, meds , ? Prerenal decrased intake Was on vanco + zosyn - vanco has been Dc and started linezolid  HE also got a dose of ibuprofen ,no IV contrast  Anemia   Renal US   Pt has foley   DM- last A1c from 7/7 is 9.2 ( was 13.6 in March 2025)   Left AKA   Anxiety /Depression- on sertraline  and trazadone- watch closely for any serotonin syndrome with linezolid   Discussed the management with the patient

## 2024-06-18 NOTE — Inpatient Diabetes Management (Signed)
 Inpatient Diabetes Program Recommendations  AACE/ADA: New Consensus Statement on Inpatient Glycemic Control (2015)  Target Ranges:  Prepandial:   less than 140 mg/dL      Peak postprandial:   less than 180 mg/dL (1-2 hours)      Critically ill patients:  140 - 180 mg/dL   Lab Results  Component Value Date   GLUCAP 342 (H) 06/18/2024   HGBA1C 9.2 (H) 05/25/2024    Review of Glycemic Control  Latest Reference Range & Units 06/17/24 08:30 06/17/24 11:15 06/17/24 12:49 06/17/24 16:00 06/17/24 20:27 06/18/24 08:32 06/18/24 12:22  Glucose-Capillary 70 - 99 mg/dL 875 (H) 92 865 (H) 826 (H) 297 (H) 330 (H) 342 (H)   Diabetes history: DM 2 Outpatient Diabetes medications: Lantus  60, Nov 10-16 tid  Current orders for Inpatient glycemic control:  Semglee  45 units qhs Novolog  0-9 units tid  Glucerna tid between meals  Inpatient Diabetes Program Recommendations:    Note: Decadron  5 mg given yesterday. Pt on meal coverage insulin  at home.  -  Consider ordering Novolog  5 units tid meal coverage if eating >50% of meals/supplements  Thanks,  Clotilda Bull RN, MSN, BC-ADM Inpatient Diabetes Coordinator Team Pager [phone] (8a-5p)

## 2024-06-18 NOTE — Progress Notes (Signed)
 Urology Inpatient Progress Note  Subjective: No acute events overnight. He is afebrile, VSS. White count stable, 11.3.  On antibiotics as below.  Scrotal abscess cultures growing Klebsiella pneumoniae.  Anti-infectives: Anti-infectives (From admission, onward)    Start     Dose/Rate Route Frequency Ordered Stop   06/16/24 1200  piperacillin -tazobactam (ZOSYN ) IVPB 3.375 g        3.375 g 12.5 mL/hr over 240 Minutes Intravenous Every 8 hours 06/16/24 1046     06/15/24 1715  linezolid  (ZYVOX ) IVPB 600 mg        600 mg 300 mL/hr over 60 Minutes Intravenous Every 12 hours 06/15/24 1614     06/15/24 1000  meropenem  (MERREM ) 1 g in sodium chloride  0.9 % 100 mL IVPB  Status:  Discontinued        1 g 200 mL/hr over 30 Minutes Intravenous Every 12 hours 06/15/24 0823 06/16/24 0907   06/14/24 2000  vancomycin  (VANCOCIN ) IVPB 1000 mg/200 mL premix  Status:  Discontinued        1,000 mg 200 mL/hr over 60 Minutes Intravenous Every 24 hours 06/14/24 0918 06/15/24 0811   06/13/24 2100  piperacillin -tazobactam (ZOSYN ) IVPB 3.375 g  Status:  Discontinued        3.375 g 12.5 mL/hr over 240 Minutes Intravenous Every 8 hours 06/13/24 1929 06/15/24 0811   06/13/24 2000  vancomycin  (VANCOCIN ) IVPB 1000 mg/200 mL premix        1,000 mg 200 mL/hr over 60 Minutes Intravenous  Once 06/13/24 1929 06/13/24 2205   06/13/24 1928  vancomycin  variable dose per unstable renal function (pharmacist dosing)  Status:  Discontinued         Does not apply See admin instructions 06/13/24 1929 06/14/24 0918   06/13/24 1630  ceFEPIme  (MAXIPIME ) 2 g in sodium chloride  0.9 % 100 mL IVPB        2 g 200 mL/hr over 30 Minutes Intravenous  Once 06/13/24 1627 06/13/24 1724   06/13/24 1630  metroNIDAZOLE  (FLAGYL ) IVPB 500 mg        500 mg 100 mL/hr over 60 Minutes Intravenous  Once 06/13/24 1627 06/13/24 1845   06/13/24 1630  vancomycin  (VANCOCIN ) IVPB 1000 mg/200 mL premix        1,000 mg 200 mL/hr over 60 Minutes Intravenous   Once 06/13/24 1627 06/13/24 1953       Current Facility-Administered Medications  Medication Dose Route Frequency Provider Last Rate Last Admin   acetaminophen  (TYLENOL ) tablet 650 mg  650 mg Oral Q6H PRN Francisca Redell BROCKS, MD   650 mg at 06/17/24 2034   amLODipine  (NORVASC ) tablet 10 mg  10 mg Oral Daily Agbata, Tochukwu, MD   10 mg at 06/18/24 0915   ascorbic acid  (VITAMIN C ) tablet 500 mg  500 mg Oral Daily Francisca Redell BROCKS, MD   500 mg at 06/18/24 9087   atorvastatin  (LIPITOR) tablet 40 mg  40 mg Oral Daily Francisca Redell BROCKS, MD   40 mg at 06/18/24 9087   Chlorhexidine  Gluconate Cloth 2 % PADS 6 each  6 each Topical Daily Francisca Redell BROCKS, MD   6 each at 06/18/24 1001   cyanocobalamin  (VITAMIN B12) tablet 1,000 mcg  1,000 mcg Oral Daily Francisca Redell BROCKS, MD   1,000 mcg at 06/18/24 0912   famotidine  (PEPCID ) tablet 10 mg  10 mg Oral BID Francisca Redell BROCKS, MD   10 mg at 06/18/24 0912   feeding supplement (GLUCERNA SHAKE) (GLUCERNA SHAKE) liquid 237 mL  237 mL Oral TID BM Francisca Redell BROCKS, MD   237 mL at 06/18/24 1519   fenofibrate  tablet 54 mg  54 mg Oral Daily Francisca Redell BROCKS, MD   54 mg at 06/18/24 9088   ferrous sulfate  tablet 325 mg  325 mg Oral Q breakfast Francisca Redell BROCKS, MD   325 mg at 06/18/24 0912   HYDROmorphone  (DILAUDID ) injection 1 mg  1 mg Intravenous Q3H PRN Agbata, Tochukwu, MD   1 mg at 06/18/24 1709   hydrOXYzine  (ATARAX ) tablet 25 mg  25 mg Oral TID PRN Francisca Redell BROCKS, MD   25 mg at 06/17/24 2034   insulin  aspart (novoLOG ) injection 0-9 Units  0-9 Units Subcutaneous TID WC Francisca Redell BROCKS, MD   7 Units at 06/18/24 1252   insulin  aspart (novoLOG ) injection 5 Units  5 Units Subcutaneous TID WC Agbata, Tochukwu, MD       insulin  glargine-yfgn (SEMGLEE ) injection 45 Units  45 Units Subcutaneous QHS Francisca Redell BROCKS, MD   45 Units at 06/17/24 2036   linezolid  (ZYVOX ) IVPB 600 mg  600 mg Intravenous Q12H Francisca Redell C, MD 300 mL/hr at 06/18/24 1000 600 mg at 06/18/24 1000    ondansetron  (ZOFRAN ) injection 4 mg  4 mg Intravenous Q8H PRN Francisca Redell BROCKS, MD       pantoprazole  (PROTONIX ) EC tablet 40 mg  40 mg Oral Daily Francisca Redell BROCKS, MD   40 mg at 06/18/24 9088   piperacillin -tazobactam (ZOSYN ) IVPB 3.375 g  3.375 g Intravenous Q8H Francisca Redell C, MD 12.5 mL/hr at 06/18/24 1518 3.375 g at 06/18/24 1518   pregabalin  (LYRICA ) capsule 75 mg  75 mg Oral BID Francisca Redell BROCKS, MD   75 mg at 06/18/24 9087   sertraline  (ZOLOFT ) tablet 100 mg  100 mg Oral Daily Francisca Redell BROCKS, MD   100 mg at 06/18/24 9087   sodium bicarbonate  tablet 650 mg  650 mg Oral BID Druscilla Bald, NP   650 mg at 06/18/24 0912   traZODone  (DESYREL ) tablet 100 mg  100 mg Oral QHS Francisca Redell BROCKS, MD   100 mg at 06/17/24 2034   Objective: Vital signs in last 24 hours: Temp:  [97.4 F (36.3 C)-97.9 F (36.6 C)] 97.6 F (36.4 C) (07/31 1226) Pulse Rate:  [64-78] 69 (07/31 1226) Resp:  [18] 18 (07/31 1226) BP: (158-216)/(79-102) 158/79 (07/31 1226) SpO2:  [96 %-100 %] 99 % (07/31 1226) Weight:  [89.6 kg] 89.6 kg (07/31 0500)  Intake/Output from previous day: 07/30 0701 - 07/31 0700 In: 4061 [P.O.:150; IV Piggyback:3911] Out: 3410 [Urine:3400; Blood:10] Intake/Output this shift: Total I/O In: 240 [P.O.:240] Out: 1000 [Urine:1000]  Physical Exam Vitals and nursing note reviewed.  Constitutional:      General: He is not in acute distress.    Appearance: He is not ill-appearing, toxic-appearing or diaphoretic.  HENT:     Head: Normocephalic and atraumatic.  Pulmonary:     Effort: Pulmonary effort is normal. No respiratory distress.  Genitourinary:    Comments: Improved scrotal edema.  Foley catheter in place draining clear, yellow urine. Skin:    General: Skin is warm and dry.  Neurological:     Mental Status: He is alert and oriented to person, place, and time.  Psychiatric:        Mood and Affect: Mood normal.        Behavior: Behavior normal.    Lab Results:  Recent  Labs    06/17/24 1028 06/18/24 0951  WBC 11.7* 11.3*  HGB 8.1* 8.8*  HCT 24.9* 27.8*  PLT 259 302   BMET Recent Labs    06/17/24 0817 06/18/24 0902  NA 139 137  K 4.1 4.3  CL 110 106  CO2 22 20*  GLUCOSE 141* 337*  BUN 35* 35*  CREATININE 3.34* 2.95*  CALCIUM  8.9 9.1   Assessment & Plan: 48 y.o. male with PMH poorly controlled diabetes and CKD admitted with scrotal cellulitis who subsequently developed a left scrotal abscess, now POD 1 from I&D with Dr. Francisca.  I exchanged his dressing at the bedside at midday today and repacked the wound with 1 inch iodoform gauze.  He required 2 x 1 mg doses of IV Dilaudid  for premedication.  Wound bed appeared healthy with granulation tissue throughout.  No active bleeding or drainage noted.  Based on wound position, we think he will be able to pack his wound himself after he gets home.  He lives with his wife, but he is unsure if she will be able to assist with this due to her busy work schedule.  Recommendations: - Continue packing exchanges twice daily while inpatient, may switch to once daily after discharge - Recommend 2 mg Dilaudid  for premedication prior to dressing changes - Continue antibiotics per ID and follow wound cultures  Lucie Hones, PA-C 06/18/2024

## 2024-06-18 NOTE — Plan of Care (Signed)

## 2024-06-18 NOTE — Progress Notes (Signed)
 [facility]  ROUNDING NOTE   Subjective:   Patient seen sitting up in bed Alert and oriented Reports scrotal soreness  Creatinine 2.95  Objective:  Vital signs in last 24 hours:  Temp:  [97 F (36.1 C)-98.2 F (36.8 C)] 97.6 F (36.4 C) (07/31 0833) Pulse Rate:  [56-78] 78 (07/31 0833) Resp:  [9-20] 18 (07/31 0833) BP: (105-216)/(66-102) 186/90 (07/31 0833) SpO2:  [95 %-100 %] 99 % (07/31 0833) Weight:  [89.6 kg] 89.6 kg (07/31 0500)  Weight change: -5 kg Filed Weights   06/16/24 0640 06/17/24 0542 06/18/24 0500  Weight: 94.2 kg 94.6 kg 89.6 kg    Intake/Output: I/O last 3 completed shifts: In: 4461 [P.O.:150; IV Piggyback:4311] Out: 4660 [Urine:4650; Blood:10]   Intake/Output this shift:  Total I/O In: 0  Out: 1000 [Urine:1000]  Physical Exam: General: NAD  Head: Normocephalic, atraumatic. Moist oral mucosal membranes  Eyes: Anicteric  Neck: Supple  Lungs:  Clear to auscultation  Heart: Regular rate and rhythm  Abdomen:  Soft, nontender  Extremities:  No peripheral edema.Lt AKA  Neurologic: Awake, alert, conversant  Skin: Warm,dry, no rash       Basic Metabolic Panel: Recent Labs  Lab 06/14/24 0447 06/15/24 0503 06/16/24 0914 06/17/24 0817 06/18/24 0902  NA 133* 135 131* 139 137  K 3.9 4.3 4.6 4.1 4.3  CL 110 108 104 110 106  CO2 18* 19* 17* 22 20*  GLUCOSE 196* 160* 321* 141* 337*  BUN 29* 38* 45* 35* 35*  CREATININE 3.02* 3.76* 4.55* 3.34* 2.95*  CALCIUM  8.0* 8.4* 8.5* 8.9 9.1  PHOS  --   --  3.6 3.2  --     Liver Function Tests: Recent Labs  Lab 06/13/24 1532 06/16/24 0912 06/16/24 0914 06/17/24 0817  AST 16 14*  --   --   ALT 13 10  --   --   ALKPHOS 101 79  --   --   BILITOT 0.8 0.5  --   --   PROT 7.4 5.4*  --   --   ALBUMIN 3.4* 2.0* 2.3* 2.3*   No results for input(s): LIPASE, AMYLASE in the last 168 hours. No results for input(s): AMMONIA in the last 168 hours.  CBC: Recent Labs  Lab  06/13/24 1532 06/14/24 0447 06/15/24 0503 06/16/24 0914 06/17/24 1028 06/18/24 0951  WBC 13.7* 15.7* 13.2* 13.4* 11.7* 11.3*  NEUTROABS 10.8*  --   --   --   --   --   HGB 11.4* 8.8* 7.9* 7.6* 8.1* 8.8*  HCT 35.4* 26.8* 24.8* 23.9* 24.9* 27.8*  MCV 78.1* 77.9* 79.0* 77.6* 77.1* 77.4*  PLT 257 188 157 186 259 302    Cardiac Enzymes: No results for input(s): CKTOTAL, CKMB, CKMBINDEX, TROPONINI in the last 168 hours.  BNP: Invalid input(s): POCBNP  CBG: Recent Labs  Lab 06/17/24 1115 06/17/24 1249 06/17/24 1600 06/17/24 2027 06/18/24 0832  GLUCAP 92 134* 173* 297* 330*    Microbiology: Results for orders placed or performed during the hospital encounter of 06/13/24  Blood Culture (routine x 2)     Status: None   Collection Time: 06/13/24  3:32 PM   Specimen: BLOOD RIGHT ARM  Result Value Ref Range Status   Specimen Description BLOOD RIGHT ARM  Final   Special Requests   Final    BOTTLES DRAWN AEROBIC AND ANAEROBIC Blood Culture results may not be optimal due to an inadequate volume of blood received in culture bottles   Culture  Final    NO GROWTH 5 DAYS Performed at [facility], [address] [place], [place] [address]    Report Status 06/18/2024 FINAL  Final  Blood Culture (routine x 2)     Status: None   Collection Time: 06/13/24  4:37 PM   Specimen: BLOOD  Result Value Ref Range Status   Specimen Description BLOOD BLOOD LEFT FOREARM  Final   Special Requests   Final    BOTTLES DRAWN AEROBIC AND ANAEROBIC Blood Culture results may not be optimal due to an inadequate volume of blood received in culture bottles   Culture   Final    NO GROWTH 5 DAYS Performed at [facility], [address] [place], [place] [address]    Report Status 06/18/2024 FINAL  Final  Aerobic/Anaerobic Culture w Gram Stain (surgical/deep wound)     Status: None (Preliminary result)   Collection Time: 06/17/24 12:12 PM   Specimen: Wound; Tissue  Result  Value Ref Range Status   Specimen Description TISSUE SCROTUM  Final   Special Requests  FROM SCROTAL ABSCESS  Final   Gram Stain   Final    NO WBC SEEN FEW GRAM POSITIVE COCCI FEW GRAM POSITIVE RODS RARE GRAM NEGATIVE RODS Performed at [facility], [address] [place], [place] [address]    Culture PENDING  Incomplete   Report Status PENDING  Incomplete  Aerobic/Anaerobic Culture w Gram Stain (surgical/deep wound)     Status: None (Preliminary result)   Collection Time: 06/17/24 12:13 PM   Specimen: Wound; Abscess  Result Value Ref Range Status   Specimen Description   Final    WOUND Performed at [facility], [address] [place], [place] [address]    Special Requests   Final     SCROTAL ABSCESS Performed at [facility], [address] [place], [place] [address]    Gram Stain   Final    ABUNDANT WBC PRESENT, PREDOMINANTLY PMN MODERATE GRAM POSITIVE COCCI FEW GRAM NEGATIVE RODS RARE GRAM POSITIVE RODS Performed at [facility], [address] [place], [place] [address]    Culture PENDING  Incomplete   Report Status PENDING  Incomplete    Coagulation Studies: No results for input(s): LABPROT, INR in the last 72 hours.   Urinalysis: No results for input(s): COLORURINE, LABSPEC, PHURINE, GLUCOSEU, HGBUR, BILIRUBINUR, KETONESUR, PROTEINUR, UROBILINOGEN, NITRITE, LEUKOCYTESUR in the last 72 hours.  Invalid input(s): APPERANCEUR     Imaging: US  RENAL Result Date: 06/16/2024 CLINICAL DATA:  Acute kidney injury EXAM: RENAL / URINARY TRACT ULTRASOUND COMPLETE COMPARISON:  None Available. FINDINGS: Right Kidney: Renal measurements: 12.6 x 5.9 x 6.8 cm = volume: 264 mL. Echogenicity within normal limits. No mass or hydronephrosis visualized. Left Kidney: Renal measurements: 12.9 x 6.8 x 5.9 cm = volume: 271 mL. Echogenicity within normal limits. There is a simple cyst in the upper pole measuring 5 x 4 x 6 mm.  No mass or hydronephrosis visualized. Bladder: Decompressed by Foley catheter. Other: None. IMPRESSION: 1. No hydronephrosis. 2. Small simple cyst in the upper pole of the left kidney. Electronically Signed   By: Greig Pique M.D.   On: 06/16/2024 16:57   CT PELVIS WO CONTRAST Result Date: 06/16/2024 CLINICAL DATA:  Scrotal swelling and edema. EXAM: CT PELVIS WITHOUT CONTRAST TECHNIQUE: Multidetector CT imaging of the pelvis was performed following the standard protocol without intravenous contrast. RADIATION DOSE REDUCTION: This exam was performed according to the departmental dose-optimization program  which includes automated exposure control, adjustment of the mA and/or kV according to patient size and/or use of iterative reconstruction technique. COMPARISON:  CT dated 06/13/2024. FINDINGS: Evaluation of this exam is limited in the absence of intravenous contrast. Urinary Tract: The urinary bladder is decompressed around a Foley catheter. Bowel:  No dilated bowel loops noted in the pelvis. Vascular/Lymphatic: Mild aortoiliac atherosclerotic disease. Mildly enlarged left external iliac chain lymph node measures 11 mm short axis. Reproductive: The prostate gland is grossly unremarkable. There is diffuse scrotal and perineal swelling and edema, progressed since the prior CT. Faint 3.7 x 1.6 cm ovoid area in the inferior left scrotal wall (64/2) may represent a developing abscess. Ultrasound may provide better evaluation if the patient is able to tolerate. No soft tissue gas. Other:  None Musculoskeletal: Degenerative changes.  No acute osseous pathology. IMPRESSION: 1. Worsened scrotal swelling with findings concerning for developing abscess in the inferior left scrotum. No soft tissue gas. 2.  Aortic Atherosclerosis (ICD10-I70.0). Electronically Signed   By: Vanetta Chou M.D.   On: 06/16/2024 13:11     Medications:    linezolid  (ZYVOX ) IV 600 mg (06/18/24 1000)   piperacillin -tazobactam (ZOSYN )  IV  3.375 g (06/18/24 0531)    amLODipine   10 mg Oral Daily   ascorbic acid   500 mg Oral Daily   atorvastatin   40 mg Oral Daily   Chlorhexidine  Gluconate Cloth  6 each Topical Daily   cyanocobalamin   1,000 mcg Oral Daily   famotidine   10 mg Oral BID   feeding supplement (GLUCERNA SHAKE)  237 mL Oral TID BM   fenofibrate   54 mg Oral Daily   ferrous sulfate   325 mg Oral Q breakfast    HYDROmorphone  (DILAUDID ) injection  1 mg Intravenous Once   insulin  aspart  0-9 Units Subcutaneous TID WC   insulin  glargine-yfgn  45 Units Subcutaneous QHS   pantoprazole   40 mg Oral Daily   pregabalin   75 mg Oral BID   sertraline   100 mg Oral Daily   sodium bicarbonate   650 mg Oral BID   traZODone   100 mg Oral QHS   acetaminophen , hydrOXYzine , ondansetron  (ZOFRAN ) IV  Assessment/ Plan:  [name] is a 48 y.o.  male with past medical conditions including PVD on Eliquis , type 1 diabetes with insulin  pump, hypertension, hyperlipidemia, left AKA, CKD stage III B,, who was admitted to [facility] on 06/13/2024 for Cellulitis of perineum [L03.315] Cellulitis of scrotum [N49.2]   Acute kidney injury on chronic kidney disease stage IIIb.  Baseline creatinine appears to be 2.09 with GFR 39 on 02/05/2024.  Acute kidney injury likely secondary to infection.  Creatinine 3.16 on admission.  CT pelvis negative for obstruction.  No IV contrast exposure.  No acute indication for dialysis.  Creatinine improving. No acute indication for dialysis. Will continue to monitor.   Lab Results  Component Value Date   CREATININE 2.95 (H) 06/18/2024   CREATININE 3.34 (H) 06/17/2024   CREATININE 4.55 (H) 06/16/2024    Intake/Output Summary (Last 24 hours) at 06/18/2024 1157 Last data filed at 06/18/2024 0904 Gross per 24 hour  Intake 4061.02 ml  Output 4410 ml  Net -348.98 ml    2. Anemia of chronic kidney disease Lab Results  Component Value Date   HGB 8.8 (L) 06/18/2024    Will continue to monitor and assess need  for ESA.  3. Acute metabolic acidosis, likely secondary to kidney injury.  S bicarb 20. Continue oral supplementation.  4. Diabetes mellitus type II with chronic kidney disease/renal manifestations: insulin  dependent. Home regimen includes NovoLog  and Lantus . Most recent hemoglobin A1c is 9.2 on 05/25/2024.   Glucose well-managed.   LOS: 5 Leanndra Pember 7/31/202511:57 AM

## 2024-06-18 NOTE — Anesthesia Postprocedure Evaluation (Signed)
 Anesthesia Post Note  Patient: Perle Gibbon Milligan  Procedure(s) Performed: INCISION AND DRAINAGE, SCROTAL ABSCESS (Scrotum)  Patient location during evaluation: PACU Anesthesia Type: General Level of consciousness: awake and alert, oriented and patient cooperative Pain management: pain level controlled Vital Signs Assessment: post-procedure vital signs reviewed and stable Respiratory status: spontaneous breathing, nonlabored ventilation and respiratory function stable Cardiovascular status: blood pressure returned to baseline and stable Postop Assessment: adequate PO intake Anesthetic complications: no   No notable events documented.   Last Vitals:  Vitals:   06/18/24 0548 06/18/24 0833  BP: (!) 216/102 (!) 186/90  Pulse: 71 78  Resp: 18 18  Temp: (!) 36.3 C 36.4 C  SpO2: 96% 99%    Last Pain:  Vitals:   06/18/24 0554  TempSrc:   PainSc: 3                  Kale Dols

## 2024-06-18 NOTE — Progress Notes (Signed)
 Progress Note   Patient: Richard Mendoza FMW:[id number] DOB: [birth year] DOA: 06/13/2024     5 DOS: the patient was seen and examined on 06/18/2024   Brief hospital course:  Albeiro Trompeter Arentz is a 48 y.o. male with medical history significant of status post left AKA, PVD on Eliquis , DM-type I on insulin  pump, hypertension, chronic pain syndrome, CKD-3a, HLD, tobacco abuse, who presents with scrotal pain and fever.  Pt state that 3 days ago he had some irritation in his perineal area that he thought may be related to an ingrown hair.  Ever since then, he has developed pain and swelling in the left scrotal area, as well as of his penile foreskin.  The pain is constant, aching, severe, nonradiating, not aggravated or alleviated by any known factors. He has difficulty retracting his foreskin with difficult urinating, but still able to void.  He developed fever and chills.  His temperature is 101.2 in the ED.  Patient does not have chest pain, cough, SOB.  No nausea, vomiting, diarrhea or abdominal pain.   Data reviewed independently and ED Course: pt was found to have WBC 13.7, worsening renal function, lactic acid 2.2 --> 2.0, temperature 101.2, blood pressure 174/114, heart rate 120-130, RR 16, oxygen saturation 100% on room air.  Patient is admitted to telemetry bed as inpatient.  ED physician discussed with Dr. Nieves of urology.   CT of abdomen/pelvis: 1. Diffuse scrotal wall thickening and inflammatory changes in the left perineum. No drainable fluid collection or abscess. No soft tissue gas. 2.  Aortic Atherosclerosis (ICD10-I70.0).   EKG: I have personally reviewed.  Sinus rhythm, QTc 482, bifascicular block       Assessment and Plan:   Sepsis due to cellulitis of scrotum:   Patient met criteria for sepsis on admission with WBC 13.7, temperature 101.2, heart rate up to 130s.  Lactic acid 2.2 -> 2.0.  ED patient discussed with Dr. Nieves of urology.  Recommended  reconsultation as needed Patient seen in consultation by Dr Francisca 07/28, agrees with continuing IV antibiotic therapy and recommends to hold patient's Eliquis  for possible procedure. Vancomycin  and Zosyn  were discontinued due to worsening renal function  Patient is currently on linezolid  and Zosyn  per ID recommendation Repeat CT scan of the pelvis without contrast showed worsened scrotal swelling with findings concerning for developing abscess in the inferior left scrotum. No soft tissue gas. 07/30 - Appreciate urology input, patient is status post incision and drainage of scrotal abscess Continue local wound care.Urology will perform first dressing today, continue wet-to-dry wound care twice daily  Surgical wound culture revealed Klebsiella species.  Improved leukocytosis     Essential hypertension Lasix  and losartan are on hold due to worsening renal function  start patient on amlodipine  for blood pressure control       Acute kidney injury superimposed on stage IIIb chronic kidney disease Unclear etiology and may be medication induced versus postobstructive At baseline patient has a serum creatinine of 2.1>> 3.76 >> 4.55 >> 2.95 Continue to hold losartan and Lasix   Appreciate nephrology input Renal ultrasound is negative for hydronephrosis Renal function is improved but not back to baseline        Acute anemia superimposed on anemia of chronic kidney disease Iron  deficiency anemia Noted to have a hemoglobin of 7.9 from a baseline of 13 Most likely hemodilutional Serum iron  level is 8 Will need referral to GI as an outpatient       Acute Urinary Retention Patient with  complaints of difficulty voiding Bladder scan yielded > 300cc of urine Foley catheter in place Voiding trial prior to discharge     PAD (peripheral artery disease) (HCC) Continue Eliquis , Lipitor     Type 1 diabetes mellitus with diabetic neuropathy [facility]):  Recent A1c 9.2, poorly controlled.    Received systemic steroids resulting in worsening hyperglycemia Continue long-acting insulin  @ 45 units daily Add NovoLog  5 units with meals SSI Maintain consistent carbohydrate diet    Chronic diastolic CHF (congestive heart failure) (HCC):  2D Echo on 08/28/2023 showed EF of 55-60% with grade 1 diastolic dysfunction. CHF is compensated. - Hold Lasix  and losartan due to AKI     HLD (hyperlipidemia) -Lipitor, fenofibrate      Iron  deficiency anemia, unspecified: Hemoglobin stable, 11.4 today. - Continue iron  supplement   Chronic pain syndrome -Continue home oral Dilaudid , lyrica      Depression -Continue sertraline  and trazodone      Overweight (BMI 25.0-29.9): Body weight 92 kg, BMI 29.10 - Encourage losing weight - Exercise and healthy diet                 Subjective: Feels better this morning.  Improved leukocytosis  Physical Exam: Vitals:   06/18/24 0500 06/18/24 0548 06/18/24 0833 06/18/24 1226  BP:  (!) 216/102 (!) 186/90 (!) 158/79  Pulse:  71 78 69  Resp:  18 18 18   Temp:  (!) 97.4 F (36.3 C) 97.6 F (36.4 C) 97.6 F (36.4 C)  TempSrc:  Oral    SpO2:  96% 99% 99%  Weight: 89.6 kg     Height:       General: Not in acute distress HEENT:       Eyes: PERRL, EOMI, no jaundice       ENT: No discharge from the ears and nose, no pharynx injection, no tonsillar enlargement.        Neck: No JVD, no bruit, no mass felt. Heme: No neck lymph node enlargement. Cardiac: S1/S2, RRR, No murmurs, No gallops or rubs. Respiratory: No rales, wheezing, rhonchi or rubs. GI: Soft, nondistended, nontender, no rebound pain, no organomegaly, BS present. GU: has erythema, tenderness, warmth, swelling to left scrotum and penile foreskin.  No fluctuance.           Ext: No pitting leg edema bilaterally. S/p of left AKA Musculoskeletal: No joint deformities, No joint redness or warmth, no limitation of ROM in spin. Skin: No rashes.  Neuro: Alert, oriented X3,  cranial nerves II-XII grossly intact, moves all extremities normally. Psych: Patient is not psychotic, no suicidal or hemocidal ideation.       Data Reviewed: White count 11.3, hemoglobin 8.8, BUN 35, creatinine 2.95 Labs reviewed  Family Communication: Plan of care discussed with patient in detail.  Will switch to oral opioids in a.m.  Disposition: Status is: Inpatient Remains inpatient appropriate because: On IV antibiotics and getting wound care  Planned Discharge Destination: Home    Time spent: 40 minutes  Author: Aimee Somerset, MD 06/18/2024 2:50 PM  For on call review [email or web address].

## 2024-06-18 NOTE — Progress Notes (Signed)
 Ordered received from Dr. Cleatus to restart home B/P medication, Lotensin  40 mg at 0700. Requested pharmacy to send medication.

## 2024-06-19 DIAGNOSIS — N189 Chronic kidney disease, unspecified: Secondary | ICD-10-CM | POA: Diagnosis not present

## 2024-06-19 DIAGNOSIS — N179 Acute kidney failure, unspecified: Secondary | ICD-10-CM | POA: Diagnosis not present

## 2024-06-19 DIAGNOSIS — E1165 Type 2 diabetes mellitus with hyperglycemia: Secondary | ICD-10-CM | POA: Diagnosis not present

## 2024-06-19 DIAGNOSIS — B961 Klebsiella pneumoniae [K. pneumoniae] as the cause of diseases classified elsewhere: Secondary | ICD-10-CM | POA: Diagnosis not present

## 2024-06-19 DIAGNOSIS — N492 Inflammatory disorders of scrotum: Secondary | ICD-10-CM | POA: Diagnosis not present

## 2024-06-19 LAB — GLUCOSE, CAPILLARY
Glucose-Capillary: 132 mg/dL — ABNORMAL HIGH (ref 70–99)
Glucose-Capillary: 137 mg/dL — ABNORMAL HIGH (ref 70–99)
Glucose-Capillary: 107 mg/dL — ABNORMAL HIGH (ref 70–99)
Glucose-Capillary: 93 mg/dL (ref 70–99)

## 2024-06-19 LAB — BASIC METABOLIC PANEL WITH GFR
Anion gap: 7 (ref 5–15)
BUN: 34 mg/dL — ABNORMAL HIGH (ref 6–20)
CO2: 20 mmol/L — ABNORMAL LOW (ref 22–32)
Calcium: 8.5 mg/dL — ABNORMAL LOW (ref 8.9–10.3)
Chloride: 112 mmol/L — ABNORMAL HIGH (ref 98–111)
Creatinine, Ser: 2.74 mg/dL — ABNORMAL HIGH (ref 0.61–1.24)
GFR, Estimated: 28 mL/min — ABNORMAL LOW (ref >60)
Glucose, Bld: 197 mg/dL — ABNORMAL HIGH (ref 70–99)
Potassium: 4.1 mmol/L (ref 3.5–5.1)
Sodium: 139 mmol/L (ref 135–145)

## 2024-06-19 MED ORDER — HYDROMORPHONE HCL 1 MG/ML IJ SOLN
2.0000 mg | Freq: Once | INTRAMUSCULAR | Status: AC
  Administered 2024-06-19: 2 mg via INTRAVENOUS
  Filled 2024-06-19: qty 2

## 2024-06-19 MED ORDER — LOPERAMIDE HCL 1 MG/7.5ML PO SUSP
1.0000 mg | Freq: Every day | ORAL | Status: DC | PRN
  Administered 2024-06-19: 1 mg via ORAL
  Filled 2024-06-19 (×2): qty 7.5

## 2024-06-19 MED ORDER — HYDROMORPHONE HCL 2 MG PO TABS
2.0000 mg | ORAL_TABLET | ORAL | Status: DC | PRN
  Administered 2024-06-19 – 2024-06-23 (×12): 2 mg via ORAL
  Filled 2024-06-19 (×13): qty 1

## 2024-06-19 MED ORDER — ENOXAPARIN SODIUM 40 MG/0.4ML IJ SOSY
40.0000 mg | PREFILLED_SYRINGE | INTRAMUSCULAR | Status: DC
  Administered 2024-06-19 – 2024-06-24 (×6): 40 mg via SUBCUTANEOUS
  Filled 2024-06-19 (×6): qty 0.4

## 2024-06-19 MED ORDER — HYDROMORPHONE HCL 1 MG/ML IJ SOLN
1.0000 mg | Freq: Once | INTRAMUSCULAR | Status: AC
  Administered 2024-06-19: 1 mg via INTRAVENOUS
  Filled 2024-06-19: qty 1

## 2024-06-19 NOTE — Evaluation (Signed)
 Physical Therapy Evaluation Patient Details Name: Richard Mendoza MRN: [id number] DOB: [birth year] Today's Date: 06/19/2024  History of Present Illness  Pt is a 48 yo Male presenting to hospital with scrotum abscess, s/p I&D on 06/17/24. PMH significant for Left AKA 01/29/24 (s/p BKA in the past), DM on insulin  pump, HTN, ACDF 2018, L RCR, R shoulder arthroscopy with debridement and biceps tendon repair;  Clinical Impression  48 yo Male reports chronic pain. He has left leg AKA from March 2025. Patient reports being at inpatient rehab for a few months after procedure and has since been home. Patient lives at home alone. He has some family help but intermittent. Patient expressed concern over caring for himself and being unable to complete dressing changes. He also expressed chronic pain and soreness over excessive mobility. Patient does have a wheelchair at home but states it doesn't work well. He would benefit from a referral for outpatient PT evaluation to assess for optimal wheelchair as he will need wheelchair for prolonged mobility. Patient does require min A for transfers and steadying. He states he can walk short distances but unable this session due to fatigue/pain. He has been fitted for prosthetic but is waiting on insurance approval. Patient would benefit from skilled PT Intervention to improve strength and mobility. Patient is able to express understanding of proper position to reduce hip flexor contraction risk but continues to rest in flexed position.         If plan is discharge home, recommend the following: A little help with walking and/or transfers;A little help with bathing/dressing/bathroom;Assistance with cooking/housework;Assist for transportation;Help with stairs or ramp for entrance   Can travel by private vehicle   Yes    Equipment Recommendations    Recommendations for Other Services       Functional Status Assessment Patient has had a recent decline in their  functional status and demonstrates the ability to make significant improvements in function in a reasonable and predictable amount of time.     Precautions / Restrictions Precautions Precautions: None Restrictions Weight Bearing Restrictions Per Provider Order: No      Mobility  Bed Mobility Overal bed mobility: Modified Independent             General bed mobility comments: good safety awareness and positioning; able to verbalize proper position for residual limb care    Transfers Overall transfer level: Needs assistance Equipment used: Rolling walker (2 wheels) Transfers: Sit to/from Stand Sit to Stand: Min assist           General transfer comment: regular height bed; cues for hand placement; able to complete x2 reps; able to pivot up in bed on right foot x2 steps    Ambulation/Gait               General Gait Details: NT this session;  Stairs            Wheelchair Mobility     Tilt Bed    Modified Rankin (Stroke Patients Only)       Balance Overall balance assessment: Modified Independent                                           Pertinent Vitals/Pain Pain Assessment Pain Assessment: 0-10 Pain Score: 6  Pain Location: pain everywhere (shoulder, residual limb, scrotum) Pain Descriptors / Indicators: Aching, Sore Pain Intervention(s): Limited activity within patient's  tolerance, Monitored during session, Repositioned    Home Living Family/patient expects to be discharged to:: Private residence Living Arrangements: Alone Available Help at Discharge: Family;Available PRN/intermittently Type of Home: Mobile home Home Access: Stairs to enter;Ramped entrance Entrance Stairs-Rails: Right;Left Entrance Stairs-Number of Steps: 5   Home Layout: One level Home Equipment: Agricultural consultant (2 wheels);Wheelchair - manual;Cane - single point;BSC/3in1;Tub bench Additional Comments: Pt uses RW most of the time for mobility     Prior Function Prior Level of Function : Independent/Modified Independent;Needs assist       Physical Assist : Mobility (physical) Mobility (physical): Gait   Mobility Comments: Mod I using RW for short distances; Would need help to get bath chair into bath, but then could bathe self and dress self mod I       Extremity/Trunk Assessment   Upper Extremity Assessment Upper Extremity Assessment: Overall WFL for tasks assessed    Lower Extremity Assessment Lower Extremity Assessment: Generalized weakness;LLE deficits/detail LLE Deficits / Details: LLE AKA, able to achieve hip extension to neutral without difficulty    Cervical / Trunk Assessment Cervical / Trunk Assessment: Normal  Communication   Communication Communication: No apparent difficulties    Cognition Arousal: Alert Behavior During Therapy: WFL for tasks assessed/performed   PT - Cognitive impairments: No apparent impairments                         Following commands: Intact       Cueing Cueing Techniques: Verbal cues     General Comments General comments (skin integrity, edema, etc.): has redness on scrotum (not formally assessed); LLE residual limb looked good, no concerns.    Exercises Other Exercises Other Exercises: Educated patient on role of PT/recommendations   Assessment/Plan    PT Assessment Patient needs continued PT services  PT Problem List Decreased strength;Decreased activity tolerance;Decreased balance;Decreased mobility;Pain       PT Treatment Interventions DME instruction;Gait training;Functional mobility training;Patient/family education;Therapeutic activities;Therapeutic exercise    PT Goals (Current goals can be found in the Care Plan section)  Acute Rehab PT Goals Patient Stated Goal: to get stronger PT Goal Formulation: With patient Time For Goal Achievement: 07/03/24 Potential to Achieve Goals: Good    Frequency Min 2X/week     Co-evaluation                AM-PAC PT 6 Clicks Mobility  Outcome Measure Help needed turning from your back to your side while in a flat bed without using bedrails?: None Help needed moving from lying on your back to sitting on the side of a flat bed without using bedrails?: None Help needed moving to and from a bed to a chair (including a wheelchair)?: A Little Help needed standing up from a chair using your arms (e.g., wheelchair or bedside chair)?: A Little Help needed to walk in hospital room?: A Lot Help needed climbing 3-5 steps with a railing? : A Lot 6 Click Score: 18    End of Session Equipment Utilized During Treatment: Gait belt Activity Tolerance: Patient tolerated treatment well;No increased pain Patient left: in bed;with call bell/phone within reach;with bed alarm set Nurse Communication: Mobility status PT Visit Diagnosis: Other abnormalities of gait and mobility (R26.89);Muscle weakness (generalized) (M62.81);Pain Pain - part of body: Shoulder;Leg    Time: 8394-8369 PT Time Calculation (min) (ACUTE ONLY): 25 min   Charges:   PT Evaluation $PT Eval Low Complexity: 1 Low   PT General Charges $$ ACUTE  PT VISIT: 1 Visit          Robbye Dede PT, DPT 06/19/2024, 5:01 PM

## 2024-06-19 NOTE — Progress Notes (Signed)
 Urology Inpatient Progress Note  Subjective: No acute events overnight. He is afebrile, VSS. No dressing change overnight.  He had a couple of episodes of fecal incontinence. Foley catheter in place draining clear, yellow urine. He thinks the scrotal swelling has gone down a bit this morning.  He has some stinging discomfort at his surgical site.  Anti-infectives: Anti-infectives (From admission, onward)    Start     Dose/Rate Route Frequency Ordered Stop   06/16/24 1200  piperacillin -tazobactam (ZOSYN ) IVPB 3.375 g        3.375 g 12.5 mL/hr over 240 Minutes Intravenous Every 8 hours 06/16/24 1046     06/15/24 1715  linezolid  (ZYVOX ) IVPB 600 mg        600 mg 300 mL/hr over 60 Minutes Intravenous Every 12 hours 06/15/24 1614     06/15/24 1000  meropenem  (MERREM ) 1 g in sodium chloride  0.9 % 100 mL IVPB  Status:  Discontinued        1 g 200 mL/hr over 30 Minutes Intravenous Every 12 hours 06/15/24 0823 06/16/24 0907   06/14/24 2000  vancomycin  (VANCOCIN ) IVPB 1000 mg/200 mL premix  Status:  Discontinued        1,000 mg 200 mL/hr over 60 Minutes Intravenous Every 24 hours 06/14/24 0918 06/15/24 0811   06/13/24 2100  piperacillin -tazobactam (ZOSYN ) IVPB 3.375 g  Status:  Discontinued        3.375 g 12.5 mL/hr over 240 Minutes Intravenous Every 8 hours 06/13/24 1929 06/15/24 0811   06/13/24 2000  vancomycin  (VANCOCIN ) IVPB 1000 mg/200 mL premix        1,000 mg 200 mL/hr over 60 Minutes Intravenous  Once 06/13/24 1929 06/13/24 2205   06/13/24 1928  vancomycin  variable dose per unstable renal function (pharmacist dosing)  Status:  Discontinued         Does not apply See admin instructions 06/13/24 1929 06/14/24 0918   06/13/24 1630  ceFEPIme  (MAXIPIME ) 2 g in sodium chloride  0.9 % 100 mL IVPB        2 g 200 mL/hr over 30 Minutes Intravenous  Once 06/13/24 1627 06/13/24 1724   06/13/24 1630  metroNIDAZOLE  (FLAGYL ) IVPB 500 mg        500 mg 100 mL/hr over 60 Minutes Intravenous  Once  06/13/24 1627 06/13/24 1845   06/13/24 1630  vancomycin  (VANCOCIN ) IVPB 1000 mg/200 mL premix        1,000 mg 200 mL/hr over 60 Minutes Intravenous  Once 06/13/24 1627 06/13/24 1953       Current Facility-Administered Medications  Medication Dose Route Frequency Provider Last Rate Last Admin   acetaminophen  (TYLENOL ) tablet 650 mg  650 mg Oral Q6H PRN Francisca Redell BROCKS, MD   650 mg at 06/17/24 2034   amLODipine  (NORVASC ) tablet 10 mg  10 mg Oral Daily Agbata, Tochukwu, MD   10 mg at 06/19/24 0849   ascorbic acid  (VITAMIN C ) tablet 500 mg  500 mg Oral Daily Francisca Redell BROCKS, MD   500 mg at 06/19/24 9150   atorvastatin  (LIPITOR) tablet 40 mg  40 mg Oral Daily Francisca Redell BROCKS, MD   40 mg at 06/19/24 9150   Chlorhexidine  Gluconate Cloth 2 % PADS 6 each  6 each Topical Daily Francisca Redell BROCKS, MD   6 each at 06/19/24 0850   cyanocobalamin  (VITAMIN B12) tablet 1,000 mcg  1,000 mcg Oral Daily Francisca Redell BROCKS, MD   1,000 mcg at 06/19/24 0849   famotidine  (PEPCID ) tablet 10 mg  10 mg Oral BID Francisca Redell BROCKS, MD   10 mg at 06/19/24 0849   feeding supplement (GLUCERNA SHAKE) (GLUCERNA SHAKE) liquid 237 mL  237 mL Oral TID BM Francisca Redell BROCKS, MD   237 mL at 06/19/24 0850   fenofibrate  tablet 54 mg  54 mg Oral Daily Francisca Redell BROCKS, MD   54 mg at 06/19/24 9150   ferrous sulfate  tablet 325 mg  325 mg Oral Q breakfast Francisca Redell BROCKS, MD   325 mg at 06/19/24 9150   HYDROmorphone  (DILAUDID ) tablet 2 mg  2 mg Oral Q4H PRN Agbata, Tochukwu, MD       hydrOXYzine  (ATARAX ) tablet 25 mg  25 mg Oral TID PRN Francisca Redell BROCKS, MD   25 mg at 06/18/24 2137   insulin  aspart (novoLOG ) injection 0-9 Units  0-9 Units Subcutaneous TID WC Francisca Redell BROCKS, MD   1 Units at 06/19/24 0848   insulin  aspart (novoLOG ) injection 5 Units  5 Units Subcutaneous TID WC Agbata, Tochukwu, MD   5 Units at 06/19/24 0848   insulin  glargine-yfgn (SEMGLEE ) injection 45 Units  45 Units Subcutaneous QHS Francisca Redell BROCKS, MD   45 Units at  06/18/24 2137   linezolid  (ZYVOX ) IVPB 600 mg  600 mg Intravenous Q12H Francisca Redell C, MD 300 mL/hr at 06/18/24 2136 600 mg at 06/18/24 2136   loperamide HCl (IMODIUM) 1 MG/7.5ML suspension 1 mg  1 mg Oral Daily PRN Duncan, Hazel V, MD   1 mg at 06/19/24 9561   ondansetron  (ZOFRAN ) injection 4 mg  4 mg Intravenous Q8H PRN Francisca Redell BROCKS, MD       pantoprazole  (PROTONIX ) EC tablet 40 mg  40 mg Oral Daily Francisca Redell BROCKS, MD   40 mg at 06/19/24 0849   piperacillin -tazobactam (ZOSYN ) IVPB 3.375 g  3.375 g Intravenous Q8H Francisca Redell C, MD 12.5 mL/hr at 06/19/24 0602 3.375 g at 06/19/24 0602   pregabalin  (LYRICA ) capsule 75 mg  75 mg Oral BID Francisca Redell BROCKS, MD   75 mg at 06/19/24 9150   sertraline  (ZOLOFT ) tablet 100 mg  100 mg Oral Daily Francisca Redell BROCKS, MD   100 mg at 06/19/24 0849   sodium bicarbonate  tablet 650 mg  650 mg Oral BID Druscilla Bald, NP   650 mg at 06/19/24 0849   traZODone  (DESYREL ) tablet 100 mg  100 mg Oral QHS Francisca Redell BROCKS, MD   100 mg at 06/18/24 2137     Objective: Vital signs in last 24 hours: Temp:  [97.5 F (36.4 C)-97.9 F (36.6 C)] 97.9 F (36.6 C) (08/01 0809) Pulse Rate:  [59-69] 66 (08/01 0809) Resp:  [16-18] 16 (08/01 0809) BP: (130-158)/(64-81) 158/81 (08/01 0809) SpO2:  [96 %-99 %] 98 % (08/01 0809) Weight:  [91.6 kg] 91.6 kg (08/01 0500)  Intake/Output from previous day: 07/31 0701 - 08/01 0700 In: 2482.3 [P.O.:240; IV Piggyback:2242.3] Out: 2550 [Urine:2550] Intake/Output this shift: No intake/output data recorded.  Physical Exam Vitals and nursing note reviewed.  Constitutional:      General: He is not in acute distress.    Appearance: He is not ill-appearing, toxic-appearing or diaphoretic.  HENT:     Head: Normocephalic and atraumatic.  Pulmonary:     Effort: Pulmonary effort is normal. No respiratory distress.  Genitourinary:    Comments: Improved scrotal swelling.  Wound bed appears healthy with granulation tissue  throughout, no active bleeding or drainage. Skin:    General: Skin is warm and dry.  Neurological:     Mental Status: He is alert and oriented to person, place, and time.  Psychiatric:        Mood and Affect: Mood normal.        Behavior: Behavior normal.    Lab Results:  Recent Labs    06/17/24 1028 06/18/24 0951  WBC 11.7* 11.3*  HGB 8.1* 8.8*  HCT 24.9* 27.8*  PLT 259 302   BMET Recent Labs    06/18/24 0902 06/19/24 0420  NA 137 139  K 4.3 4.1  CL 106 112*  CO2 20* 20*  GLUCOSE 337* 197*  BUN 35* 34*  CREATININE 2.95* 2.74*  CALCIUM  9.1 8.5*   Assessment & Plan: 48 y.o. male with PMH poorly controlled diabetes and CKD admitted with scrotal cellulitis with abscess, now POD 2 from I&D with Dr. Francisca.  Patient was premedicated with 2 mg Dilaudid  this morning.  I subsequently removed his iodoform packing from yesterday and repacked his wound with 1 inch iodoform gauze.  He tolerated well.  I covered the wound with an ABD pad and replaced his mesh underpants.  Urology will sign off at this time.  Recommendations: - Continue antibiotics and follow cultures per ID - Continue twice daily dressing changes, due next this evening.  Anticipate he will require premedication, though may consider dose reducing him from 2 mg Dilaudid .  He may return to once daily dressing changes after discharge.  I will set him up for outpatient follow-up with us  in about 3 weeks for wound recheck. - Okay to discontinue Foley catheter from the urologic perspective.  Lipa Knauff, PA-C 06/19/2024

## 2024-06-19 NOTE — Progress Notes (Addendum)
 Progress Note   Patient: Richard Mendoza Raj FMW:[id number] DOB: 1976-08-22 DOA: 06/13/2024     6 DOS: the patient was seen and examined on 06/19/2024   Brief hospital course:  [name] is a 48 y.o. male with medical history significant of status post left AKA, PVD on Eliquis , DM-type I on insulin  pump, hypertension, chronic pain syndrome, CKD-3a, HLD, tobacco abuse, who presents with scrotal pain and fever.  Pt state that 3 days ago he had some irritation in his perineal area that he thought may be related to an ingrown hair.  Ever since then, he has developed pain and swelling in the left scrotal area, as well as of his penile foreskin.  The pain is constant, aching, severe, nonradiating, not aggravated or alleviated by any known factors. He has difficulty retracting his foreskin with difficult urinating, but still able to void.  He developed fever and chills.  His temperature is 101.2 in the ED.  Patient does not have chest pain, cough, SOB.  No nausea, vomiting, diarrhea or abdominal pain.   Data reviewed independently and ED Course: pt was found to have WBC 13.7, worsening renal function, lactic acid 2.2 --> 2.0, temperature 101.2, blood pressure 174/114, heart rate 120-130, RR 16, oxygen saturation 100% on room air.  Patient is admitted to telemetry bed as inpatient.  ED physician discussed with Dr. Nieves of urology.   CT of abdomen/pelvis: 1. Diffuse scrotal wall thickening and inflammatory changes in the left perineum. No drainable fluid collection or abscess. No soft tissue gas. 2.  Aortic Atherosclerosis (ICD10-I70.0).   EKG: I have personally reviewed.  Sinus rhythm, QTc 482, bifascicular block       Assessment and Plan:   Sepsis due to cellulitis of scrotum:   Patient met criteria for sepsis on admission with WBC 13.7, temperature 101.2, heart rate up to 130s.  Lactic acid 2.2 -> 2.0.  ED patient discussed with Dr. Nieves of urology.  Recommended  reconsultation as needed Patient seen in consultation by Dr Francisca 07/28, agrees with continuing IV antibiotic therapy and recommends to hold patient's Eliquis  for possible procedure. Vancomycin  and Zosyn  were discontinued due to worsening renal function  Patient is currently on linezolid  and Zosyn  per ID recommendation 07/29 - Repeat CT scan of the pelvis without contrast showed worsened scrotal swelling with findings concerning for developing abscess in the inferior left scrotum. No soft tissue gas. 07/30 - Appreciate urology input, patient is status post incision and drainage of scrotal abscess Surgical wound culture revealed Klebsiella species.  Continue local wound care.Urology performed initial dressing and recommends to continue wet-to-dry wound care twice daily       Essential hypertension Lasix  and losartan are on hold due to worsening renal function Continue amlodipine  for blood pressure control       Acute kidney injury superimposed on stage IIIb chronic kidney disease Unclear etiology and may be medication induced versus postobstructive At baseline patient has a serum creatinine of 2.1>> 3.76 >> 4.55 >> 2.95 >> 2.74 Continue to hold losartan and Lasix   Appreciate nephrology input Renal ultrasound is negative for hydronephrosis Renal function is improved but not back to baseline         Acute anemia superimposed on anemia of chronic kidney disease Iron  deficiency anemia Noted to have a hemoglobin of 7.9 from a baseline of 13 Most likely hemodilutional Serum iron  level is 8 Will need referral to GI as an outpatient       Acute Urinary Retention  Patient with complaints of difficulty voiding Bladder scan yielded > 300cc of urine Foley catheter has been discontinued Voiding trial     PAD (peripheral artery disease) (HCC) Continue Eliquis , Lipitor     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   Received  systemic steroids resulting in worsening hyperglycemia Continue long-acting insulin  @ 45 units daily Continue NovoLog  5 units with meals SSI Maintain consistent carbohydrate diet     Chronic diastolic CHF (congestive heart failure) (HCC):  2D Echo on 08/28/2023 showed EF of 55-60% with grade 1 diastolic dysfunction. CHF is compensated. Hold Lasix  and losartan due to AKI     HLD (hyperlipidemia) -Lipitor, fenofibrate        Chronic pain syndrome -Continue home oral Dilaudid , lyrica      Depression -Continue sertraline  and trazodone      Overweight (BMI 25.0-29.9): Body weight 92 kg, BMI 29.10 - Encourage losing weight - Exercise and healthy diet             Subjective: No new complaints  Physical Exam: Vitals:   06/19/24 0002 06/19/24 0500 06/19/24 0809 06/19/24 1047  BP: 130/73  (!) 158/81 110/65  Pulse: 66  66 (!) 58  Resp: 18  16 16   Temp: 97.6 F (36.4 C)  97.9 F (36.6 C) 98 F (36.7 C)  TempSrc:      SpO2: 98%  98% 98%  Weight:  91.6 kg    Height:       General: Not in acute distress HEENT:       Eyes: PERRL, EOMI, no jaundice       ENT: No discharge from the ears and nose, no pharynx injection, no tonsillar enlargement.        Neck: No JVD, no bruit, no mass felt. Heme: No neck lymph node enlargement. Cardiac: S1/S2, RRR, No murmurs, No gallops or rubs. Respiratory: No rales, wheezing, rhonchi or rubs. GI: Soft, nondistended, nontender, no rebound pain, no organomegaly, BS present. GU: Dressing over scrotum Ext: No pitting leg edema bilaterally. S/p of left AKA Musculoskeletal: No joint deformities, No joint redness or warmth, no limitation of ROM in spin. Skin: No rashes.  Neuro: Alert, oriented X3, cranial nerves II-XII grossly intact, moves all extremities normally. Psych: Patient is not psychotic, no suicidal or hemocidal ideation.     Data Reviewed: BUN 34, creatinine 2.74, bicarb 20 Labs reviewed  Family Communication: Plan of care  discussed with patient at the bedside.  He verbalizes understanding and agrees with plan.  Disposition: Status is: Inpatient Remains inpatient appropriate because: Remains on IV antibiotics  Planned Discharge Destination: Home with Home Health    Time spent: 35 minutes  Author: Aimee Somerset, MD 06/19/2024 12:58 PM  For on call review [email or web address].

## 2024-06-19 NOTE — Progress Notes (Signed)
 Date of Admission:  06/13/2024      ID: Richard Mendoza is a 48 y.o. 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)   Chronic diastolic CHF (congestive heart failure) (HCC)   Depression   Acute renal failure superimposed on stage 3b chronic kidney disease (HCC)   Cellulitis of perineum   Scrotal abscess    Subjective: Pain better But still uncomfortable Says he has no one to pack his scrotal wound if he goes home No fever   Medications:   amLODipine   10 mg Oral Daily   ascorbic acid   500 mg Oral Daily   atorvastatin   40 mg Oral Daily   Chlorhexidine  Gluconate Cloth  6 each Topical Daily   cyanocobalamin   1,000 mcg Oral Daily   enoxaparin  (LOVENOX ) injection  40 mg Subcutaneous Q24H   famotidine   10 mg Oral BID   feeding supplement (GLUCERNA SHAKE)  237 mL Oral TID BM   fenofibrate   54 mg Oral Daily   ferrous sulfate   325 mg Oral Q breakfast   insulin  aspart  5 Units Subcutaneous TID WC   insulin  glargine-yfgn  45 Units Subcutaneous QHS   pantoprazole   40 mg Oral Daily   pregabalin   75 mg Oral BID   sertraline   100 mg Oral Daily   sodium bicarbonate   650 mg Oral BID   traZODone   100 mg Oral QHS    Objective: Vital signs in last 24 hours: Patient Vitals for the past 24 hrs:  BP Temp Pulse Resp SpO2 Weight  06/19/24 1047 110/65 98 F (36.7 C) (!) 58 16 98 % --  06/19/24 0809 (!) 158/81 97.9 F (36.6 C) 66 16 98 % --  06/19/24 0500 -- -- -- -- -- 91.6 kg  06/19/24 0002 130/73 97.6 F (36.4 C) 66 18 98 % --  06/18/24 2051 (!) 141/80 97.8 F (36.6 C) 60 18 98 % --  06/18/24 1717 130/64 (!) 97.5 F (36.4 C) (!) 59 18 96 % --     Lines and Device Date on insertion # of days DC   Engineer, technical sales     ETT       PHYSICAL EXAM:  General: Alert, cooperative, no distress, appears stated age.  Lungs: Clear to auscultation bilaterally. No Wheezing or Rhonchi. No rales. Heart: Regular rate and rhythm, no murmur, rub or gallop. Abdomen: Soft, non-tender,not distended. Bowel sounds normal. No masses scrotum dressing not removed  Extremities: left AKA Skin: No rashes or lesions. Or bruising Lymph: Cervical, supraclavicular normal. Neurologic: Grossly non-focal  Lab Results    Latest Ref Rng & Units 06/18/2024    9:51 AM 06/17/2024   10:28 AM 06/16/2024    9:14 AM  CBC  WBC 4.0 - 10.5 K/uL 11.3  11.7  13.4   Hemoglobin 13.0 - 17.0 g/dL 8.8  8.1  7.6   Hematocrit 39.0 - 52.0 % 27.8  24.9  23.9   Platelets 150 - 400 K/uL 302  259  186        Latest Ref Rng & Units 06/19/2024    4:20 AM 06/18/2024    9:02 AM 06/17/2024    8:17 AM  CMP  Glucose 70 - 99 mg/dL 802  662  858   BUN 6 - 20 mg/dL 34  35  35   Creatinine 0.61 - 1.24 mg/dL 7.25  7.04  6.65   Sodium 135 - 145 mmol/L 139  137  139   Potassium 3.5 - 5.1 mmol/L 4.1  4.3  4.1   Chloride 98 - 111 mmol/L 112  106  110   CO2 22 - 32 mmol/L 20  20  22    Calcium  8.9 - 10.3 mg/dL 8.5  9.1  8.9       Microbiology: BC- NG WC- klebsiella pneumoniae, anerobes Studies/Results: No results found.    Assessment/Plan: Scrotal abscess with surrounding cellulitis- s/p I/D Culture so far klebsiella pneumoniae S/p I/D culture pending ( so far klebsiella and possible anerobes) He has h/o MRSA/staph infection- Currently on zosyn   and Linezolid , await final culture report before we can decide on discharge antibiotics Need to make sure he can do the scrotal dressing packing at home as he says he has no help    AKI on CKD- improving Combination of infection, meds , ? Prerenal decrased intake Was on vanco + zosyn - vanco has been Dc and started linezolid  HE also got a dose of ibuprofen ,no IV  contrast  Anemia   Renal US   Pt has foley   DM- last A1c from 7/7 is 9.2 ( was 13.6 in March 2025)   Left AKA   Anxiety /Depression- on sertraline  and trazadone- watch closely for any serotonin syndrome with linezolid   Discussed the management with the patient and hospitalist ID will not routinely follow him this weekend Call if needed

## 2024-06-19 NOTE — Progress Notes (Signed)
 [facility]  ROUNDING NOTE   Subjective:   Patient seen sitting up in bed No family present Appetite remains appropriate Denies nausea or vomiting Room air  Creatinine 2.74  Objective:  Vital signs in last 24 hours:  Temp:  [97.5 F (36.4 C)-98 F (36.7 C)] 98 F (36.7 C) (08/01 1047) Pulse Rate:  [58-66] 58 (08/01 1047) Resp:  [16-18] 16 (08/01 1047) BP: (110-158)/(64-81) 110/65 (08/01 1047) SpO2:  [96 %-98 %] 98 % (08/01 1047) Weight:  [91.6 kg] 91.6 kg (08/01 0500)  Weight change: 2 kg Filed Weights   06/17/24 0542 06/18/24 0500 06/19/24 0500  Weight: 94.6 kg 89.6 kg 91.6 kg    Intake/Output: I/O last 3 completed shifts: In: 6443.3 [P.O.:390; IV Piggyback:6053.3] Out: 4350 [Urine:4350]   Intake/Output this shift:  Total I/O In: 240 [P.O.:240] Out: -   Physical Exam: General: NAD  Head: Normocephalic, atraumatic. Moist oral mucosal membranes  Eyes: Anicteric  Neck: Supple  Lungs:  Clear to auscultation  Heart: Regular rate and rhythm  Abdomen:  Soft, nontender  Extremities:  No peripheral edema.Lt AKA  Neurologic: Awake, alert, conversant  Skin: Warm,dry, no rash.  Scrotal wound  GU Foley catheter    Basic Metabolic Panel: Recent Labs  Lab 06/15/24 0503 06/16/24 0914 06/17/24 0817 06/18/24 0902 06/19/24 0420  NA 135 131* 139 137 139  K 4.3 4.6 4.1 4.3 4.1  CL 108 104 110 106 112*  CO2 19* 17* 22 20* 20*  GLUCOSE 160* 321* 141* 337* 197*  BUN 38* 45* 35* 35* 34*  CREATININE 3.76* 4.55* 3.34* 2.95* 2.74*  CALCIUM  8.4* 8.5* 8.9 9.1 8.5*  PHOS  --  3.6 3.2  --   --     Liver Function Tests: Recent Labs  Lab 06/13/24 1532 06/16/24 0912 06/16/24 0914 06/17/24 0817  AST 16 14*  --   --   ALT 13 10  --   --   ALKPHOS 101 79  --   --   BILITOT 0.8 0.5  --   --   PROT 7.4 5.4*  --   --   ALBUMIN 3.4* 2.0* 2.3* 2.3*   No results for input(s): LIPASE, AMYLASE in the last 168 hours. No results for input(s): AMMONIA in  the last 168 hours.  CBC: Recent Labs  Lab 06/13/24 1532 06/14/24 0447 06/15/24 0503 06/16/24 0914 06/17/24 1028 06/18/24 0951  WBC 13.7* 15.7* 13.2* 13.4* 11.7* 11.3*  NEUTROABS 10.8*  --   --   --   --   --   HGB 11.4* 8.8* 7.9* 7.6* 8.1* 8.8*  HCT 35.4* 26.8* 24.8* 23.9* 24.9* 27.8*  MCV 78.1* 77.9* 79.0* 77.6* 77.1* 77.4*  PLT 257 188 157 186 259 302    Cardiac Enzymes: No results for input(s): CKTOTAL, CKMB, CKMBINDEX, TROPONINI in the last 168 hours.  BNP: Invalid input(s): POCBNP  CBG: Recent Labs  Lab 06/18/24 1222 06/18/24 1716 06/18/24 2112 06/19/24 0809 06/19/24 1200  GLUCAP 342* 289* 211* 132* 137*    Microbiology: Results for orders placed or performed during the hospital encounter of 06/13/24  Blood Culture (routine x 2)     Status: None   Collection Time: 06/13/24  3:32 PM   Specimen: BLOOD RIGHT ARM  Result Value Ref Range Status   Specimen Description BLOOD RIGHT ARM  Final   Special Requests   Final    BOTTLES DRAWN AEROBIC AND ANAEROBIC Blood Culture results may not be optimal due to an inadequate volume of  blood received in culture bottles   Culture   Final    NO GROWTH 5 DAYS Performed at [facility], [address] [place], [place] [address]    Report Status 06/18/2024 FINAL  Final  Blood Culture (routine x 2)     Status: None   Collection Time: 06/13/24  4:37 PM   Specimen: BLOOD  Result Value Ref Range Status   Specimen Description BLOOD BLOOD LEFT FOREARM  Final   Special Requests   Final    BOTTLES DRAWN AEROBIC AND ANAEROBIC Blood Culture results may not be optimal due to an inadequate volume of blood received in culture bottles   Culture   Final    NO GROWTH 5 DAYS Performed at [facility], [address], [place], [place] [address]    Report Status 06/18/2024 FINAL  Final  Aerobic/Anaerobic Culture w Gram Stain (surgical/deep wound)     Status: None (Preliminary result)   Collection Time:  06/17/24 12:12 PM   Specimen: Wound; Tissue  Result Value Ref Range Status   Specimen Description TISSUE SCROTUM  Final   Special Requests  FROM SCROTAL ABSCESS  Final   Gram Stain   Final    NO WBC SEEN FEW GRAM POSITIVE COCCI FEW GRAM POSITIVE RODS RARE GRAM NEGATIVE RODS    Culture   Final    RARE KLEBSIELLA PNEUMONIAE SUSCEPTIBILITIES TO FOLLOW HOLDING FOR POSSIBLE ANAEROBE Performed at [facility], [address], [place], [place] [address]    Report Status PENDING  Incomplete  Aerobic/Anaerobic Culture w Gram Stain (surgical/deep wound)     Status: None (Preliminary result)   Collection Time: 06/17/24 12:13 PM   Specimen: Wound; Abscess  Result Value Ref Range Status   Specimen Description   Final    WOUND Performed at [facility], [address], [place], [place] [address]    Special Requests   Final     SCROTAL ABSCESS Performed at [facility], [address] [place], [place] [address]    Gram Stain   Final    ABUNDANT WBC PRESENT, PREDOMINANTLY PMN MODERATE GRAM POSITIVE COCCI FEW GRAM NEGATIVE RODS RARE GRAM POSITIVE RODS    Culture   Final    RARE KLEBSIELLA PNEUMONIAE CULTURE REINCUBATED FOR BETTER GROWTH HOLDING FOR POSSIBLE ANAEROBE SUSCEPTIBILITIES TO FOLLOW Performed at [facility], [address], [place], [place] [address]    Report Status PENDING  Incomplete    Coagulation Studies: No results for input(s): LABPROT, INR in the last 72 hours.   Urinalysis: No results for input(s): COLORURINE, LABSPEC, PHURINE, GLUCOSEU, HGBUR, BILIRUBINUR, KETONESUR, PROTEINUR, UROBILINOGEN, NITRITE, LEUKOCYTESUR in the last 72 hours.  Invalid input(s): APPERANCEUR     Imaging: No results found.    Medications:    linezolid  (ZYVOX ) IV 600 mg (06/19/24 1120)   piperacillin -tazobactam (ZOSYN )  IV 3.375 g (06/19/24 1256)    amLODipine   10 mg Oral Daily   ascorbic acid   500 mg Oral Daily    atorvastatin   40 mg Oral Daily   Chlorhexidine  Gluconate Cloth  6 each Topical Daily   cyanocobalamin   1,000 mcg Oral Daily   enoxaparin  (LOVENOX ) injection  40 mg Subcutaneous Q24H   famotidine   10 mg Oral BID   feeding supplement (GLUCERNA SHAKE)  237 mL Oral TID BM   fenofibrate   54 mg Oral Daily   ferrous sulfate   325 mg Oral Q breakfast   insulin  aspart  5 Units Subcutaneous TID WC  insulin  glargine-yfgn  45 Units Subcutaneous QHS   pantoprazole   40 mg Oral Daily   pregabalin   75 mg Oral BID   sertraline   100 mg Oral Daily   sodium bicarbonate   650 mg Oral BID   traZODone   100 mg Oral QHS   acetaminophen , HYDROmorphone , hydrOXYzine , loperamide HCl, ondansetron  (ZOFRAN ) IV  Assessment/ Plan:  Richard Mendoza is a 48 y.o.  male with past medical conditions including PVD on Eliquis , type 1 diabetes with insulin  pump, hypertension, hyperlipidemia, left AKA, CKD stage III B,, who was admitted to [facility] on 06/13/2024 for Cellulitis of perineum [L03.315] Cellulitis of scrotum [N49.2]   Acute kidney injury on chronic kidney disease stage IIIb.  Baseline creatinine appears to be 2.09 with GFR 39 on 02/05/2024.  Acute kidney injury likely secondary to infection.  Creatinine 3.16 on admission.  CT pelvis negative for obstruction.  No IV contrast exposure.  No acute indication for dialysis.  Renal function continues to improve.  Patient encouraged to maintain oral intake.  No acute indication for dialysis.  Will continue to monitor for now.  Lab Results  Component Value Date   CREATININE 2.74 (H) 06/19/2024   CREATININE 2.95 (H) 06/18/2024   CREATININE 3.34 (H) 06/17/2024    Intake/Output Summary (Last 24 hours) at 06/19/2024 1521 Last data filed at 06/19/2024 0900 Gross per 24 hour  Intake 2482.28 ml  Output 1550 ml  Net 932.28 ml    2. Anemia of chronic kidney disease Lab Results  Component Value Date   HGB 8.8 (L) 06/18/2024  Hemoglobin slightly decreased. Will continue  to monitor and assess need for ESA.  3. Acute metabolic acidosis, likely secondary to kidney injury.  S bicarb remains 20. Continue oral supplementation.    4. Diabetes mellitus type II with chronic kidney disease/renal manifestations: insulin  dependent. Home regimen includes NovoLog  and Lantus . Most recent hemoglobin A1c is 9.2 on 05/25/2024.   Glucose elevated at times   LOS: 6 Kanin Lia 8/1/20253:21 PM

## 2024-06-19 NOTE — Plan of Care (Signed)

## 2024-06-20 DIAGNOSIS — N492 Inflammatory disorders of scrotum: Secondary | ICD-10-CM | POA: Diagnosis not present

## 2024-06-20 LAB — GLUCOSE, CAPILLARY
Glucose-Capillary: 75 mg/dL (ref 70–99)
Glucose-Capillary: 109 mg/dL — ABNORMAL HIGH (ref 70–99)
Glucose-Capillary: 166 mg/dL — ABNORMAL HIGH (ref 70–99)
Glucose-Capillary: 170 mg/dL — ABNORMAL HIGH (ref 70–99)

## 2024-06-20 MED ORDER — INSULIN GLARGINE-YFGN 100 UNIT/ML ~~LOC~~ SOLN
40.0000 [IU] | Freq: Every day | SUBCUTANEOUS | Status: DC
  Administered 2024-06-20 – 2024-06-24 (×5): 40 [IU] via SUBCUTANEOUS
  Filled 2024-06-20 (×6): qty 0.4

## 2024-06-20 NOTE — Progress Notes (Signed)
 [facility]  ROUNDING NOTE   Subjective:  Patient seen and evaluated in room, no acute complaints.  Family at bedside  Objective:  Vital signs in last 24 hours:  Temp:  [97.6 F (36.4 C)-98.6 F (37 C)] 98.6 F (37 C) (08/02 1213) Pulse Rate:  [62-80] 80 (08/02 1213) Resp:  [16-18] 18 (08/02 0804) BP: (133-172)/(72-88) 167/88 (08/02 1213) SpO2:  [96 %-99 %] 99 % (08/02 1213) Weight:  [95.7 kg] 95.7 kg (08/02 0500)  Weight change: 4.1 kg Filed Weights   06/18/24 0500 06/19/24 0500 06/20/24 0500  Weight: 89.6 kg 91.6 kg 95.7 kg    Intake/Output: I/O last 3 completed shifts: In: 2482.3 [P.O.:240; IV Piggyback:2242.3] Out: 3050 [Urine:3050]   Intake/Output this shift:  Total I/O In: 1378.5 [P.O.:240; IV Piggyback:1138.5] Out: -   Physical Exam: General: NAD  Head: Normocephalic  Eyes: Anicteric  Neck: Supple  Lungs:  On room air, clear to auscultation  Heart: Regular rate   Abdomen:  Soft, nontender,   Extremities:  no peripheral edema.  Neurologic: Alert and oriented  Skin: No rashes  Access: None    Basic Metabolic Panel: Recent Labs  Lab 06/15/24 0503 06/16/24 0914 06/17/24 0817 06/18/24 0902 06/19/24 0420  NA 135 131* 139 137 139  K 4.3 4.6 4.1 4.3 4.1  CL 108 104 110 106 112*  CO2 19* 17* 22 20* 20*  GLUCOSE 160* 321* 141* 337* 197*  BUN 38* 45* 35* 35* 34*  CREATININE 3.76* 4.55* 3.34* 2.95* 2.74*  CALCIUM  8.4* 8.5* 8.9 9.1 8.5*  PHOS  --  3.6 3.2  --   --     Liver Function Tests: Recent Labs  Lab 06/16/24 0912 06/16/24 0914 06/17/24 0817  AST 14*  --   --   ALT 10  --   --   ALKPHOS 79  --   --   BILITOT 0.5  --   --   PROT 5.4*  --   --   ALBUMIN 2.0* 2.3* 2.3*   No results for input(s): LIPASE, AMYLASE in the last 168 hours. No results for input(s): AMMONIA in the last 168 hours.  CBC: Recent Labs  Lab 06/14/24 0447 06/15/24 0503 06/16/24 0914 06/17/24 1028 06/18/24 0951  WBC 15.7* 13.2* 13.4* 11.7*  11.3*  HGB 8.8* 7.9* 7.6* 8.1* 8.8*  HCT 26.8* 24.8* 23.9* 24.9* 27.8*  MCV 77.9* 79.0* 77.6* 77.1* 77.4*  PLT 188 157 186 259 302    Cardiac Enzymes: No results for input(s): CKTOTAL, CKMB, CKMBINDEX, TROPONINI in the last 168 hours.  BNP: Invalid input(s): POCBNP  CBG: Recent Labs  Lab 06/19/24 1200 06/19/24 1732 06/19/24 2049 06/20/24 0758 06/20/24 1209  GLUCAP 137* 107* 93 75 109*    Microbiology: Results for orders placed or performed during the hospital encounter of 06/13/24  Blood Culture (routine x 2)     Status: None   Collection Time: 06/13/24  3:32 PM   Specimen: BLOOD RIGHT ARM  Result Value Ref Range Status   Specimen Description BLOOD RIGHT ARM  Final   Special Requests   Final    BOTTLES DRAWN AEROBIC AND ANAEROBIC Blood Culture results may not be optimal due to an inadequate volume of blood received in culture bottles   Culture   Final    NO GROWTH 5 DAYS Performed at [facility], [address] [place], [place] [address]    Report Status 06/18/2024 FINAL  Final  Blood Culture (routine x 2)  Status: None   Collection Time: 06/13/24  4:37 PM   Specimen: BLOOD  Result Value Ref Range Status   Specimen Description BLOOD BLOOD LEFT FOREARM  Final   Special Requests   Final    BOTTLES DRAWN AEROBIC AND ANAEROBIC Blood Culture results may not be optimal due to an inadequate volume of blood received in culture bottles   Culture   Final    NO GROWTH 5 DAYS Performed at [facility], [address], [place], [place] [address]    Report Status 06/18/2024 FINAL  Final  Aerobic/Anaerobic Culture w Gram Stain (surgical/deep wound)     Status: None (Preliminary result)   Collection Time: 06/17/24 12:12 PM   Specimen: Wound; Tissue  Result Value Ref Range Status   Specimen Description TISSUE SCROTUM  Final   Special Requests  FROM SCROTAL ABSCESS  Final   Gram Stain   Final    NO WBC SEEN FEW GRAM POSITIVE COCCI FEW GRAM  POSITIVE RODS RARE GRAM NEGATIVE RODS    Culture   Final    RARE KLEBSIELLA PNEUMONIAE Confirmed Extended Spectrum Beta-Lactamase Producer (ESBL).  In bloodstream infections from ESBL organisms, carbapenems are preferred over piperacillin /tazobactam. They are shown to have a lower risk of mortality. Two isolates with different morphologies were identified as the same organism.The most resistant organism was reported. HOLDING FOR POSSIBLE ANAEROBE CULTURE REINCUBATED FOR BETTER GROWTH Performed at [facility], [address] [place], [place] [address]    Report Status PENDING  Incomplete   Organism ID, Bacteria KLEBSIELLA PNEUMONIAE  Final      Susceptibility   Klebsiella pneumoniae - MIC*    AMPICILLIN  >=32 RESISTANT Resistant     CEFEPIME  <=0.12 SENSITIVE Sensitive     CEFTAZIDIME RESISTANT Resistant     CEFTRIAXONE  <=0.25 SENSITIVE Sensitive     CIPROFLOXACIN  <=0.25 SENSITIVE Sensitive     GENTAMICIN <=1 SENSITIVE Sensitive     IMIPENEM 1 SENSITIVE Sensitive     TRIMETH /SULFA  <=20 SENSITIVE Sensitive     AMPICILLIN /SULBACTAM >=32 RESISTANT Resistant     PIP/TAZO <=4 SENSITIVE Sensitive ug/mL    * RARE KLEBSIELLA PNEUMONIAE  Aerobic/Anaerobic Culture w Gram Stain (surgical/deep wound)     Status: None (Preliminary result)   Collection Time: 06/17/24 12:13 PM   Specimen: Wound; Abscess  Result Value Ref Range Status   Specimen Description   Final    WOUND Performed at [facility], [address], [place], [place] [address]    Special Requests   Final     SCROTAL ABSCESS Performed at [facility], [address] [place], [place] [address]    Gram Stain   Final    ABUNDANT WBC PRESENT, PREDOMINANTLY PMN MODERATE GRAM POSITIVE COCCI FEW GRAM NEGATIVE RODS RARE GRAM POSITIVE RODS    Culture   Final    RARE KLEBSIELLA PNEUMONIAE HOLDING FOR POSSIBLE ANAEROBE CULTURE REINCUBATED FOR BETTER GROWTH Performed at [facility], [address] [place], [place] [address]    Report Status PENDING  Incomplete   Organism ID, Bacteria KLEBSIELLA PNEUMONIAE  Final      Susceptibility   Klebsiella pneumoniae - MIC*    AMPICILLIN  >=32 RESISTANT Resistant     CEFEPIME  <=0.12 SENSITIVE Sensitive     CEFTAZIDIME <=1 SENSITIVE Sensitive     CEFTRIAXONE  <=0.25 SENSITIVE Sensitive     CIPROFLOXACIN  <=0.25 SENSITIVE Sensitive     GENTAMICIN <=1 SENSITIVE Sensitive     IMIPENEM <=0.25 SENSITIVE Sensitive  TRIMETH /SULFA  <=20 SENSITIVE Sensitive     AMPICILLIN /SULBACTAM 4 SENSITIVE Sensitive     PIP/TAZO <=4 SENSITIVE Sensitive ug/mL    * RARE KLEBSIELLA PNEUMONIAE    Coagulation Studies: No results for input(s): LABPROT, INR in the last 72 hours.  Urinalysis: No results for input(s): COLORURINE, LABSPEC, PHURINE, GLUCOSEU, HGBUR, BILIRUBINUR, KETONESUR, PROTEINUR, UROBILINOGEN, NITRITE, LEUKOCYTESUR in the last 72 hours.  Invalid input(s): APPERANCEUR    Imaging: No results found.   Medications:    linezolid  (ZYVOX ) IV 600 mg (06/20/24 1009)   piperacillin -tazobactam (ZOSYN )  IV 3.375 g (06/20/24 1336)    amLODipine   10 mg Oral Daily   ascorbic acid   500 mg Oral Daily   atorvastatin   40 mg Oral Daily   Chlorhexidine  Gluconate Cloth  6 each Topical Daily   cyanocobalamin   1,000 mcg Oral Daily   enoxaparin  (LOVENOX ) injection  40 mg Subcutaneous Q24H   famotidine   10 mg Oral BID   feeding supplement (GLUCERNA SHAKE)  237 mL Oral TID BM   fenofibrate   54 mg Oral Daily   ferrous sulfate   325 mg Oral Q breakfast   insulin  aspart  5 Units Subcutaneous TID WC   insulin  glargine-yfgn  40 Units Subcutaneous QHS   pantoprazole   40 mg Oral Daily   pregabalin   75 mg Oral BID   sertraline   100 mg Oral Daily   sodium bicarbonate   650 mg Oral BID   traZODone   100 mg Oral QHS   acetaminophen , HYDROmorphone , hydrOXYzine , loperamide  HCl, ondansetron  (ZOFRAN ) IV  Assessment/ Plan:  Mr. Richard Vonbargen  Mendoza is a 48 y.o.  male with past medical conditions including PVD on Eliquis , type 1 diabetes with insulin  pump, hypertension, hyperlipidemia, left AKA, CKD stage III B,, who was admitted to [facility] on 06/13/2024 for Cellulitis of perineum [L03.315] Cellulitis of scrotum [N49.2]     Acute kidney injury on chronic kidney disease stage IIIb.  Baseline creatinine appears to be 2.09 with GFR 39 on 02/05/2024.  Acute kidney injury likely secondary to infection.  Creatinine 3.16 on admission.  CT pelvis negative for obstruction.  No IV contrast exposure.  No acute indication for dialysis.  Renal function continues to improve.  Patient encouraged to maintain oral intake. Continue to monitor renal indices Lab Results  Component Value Date   CREATININE 2.74 (H) 06/19/2024   CREATININE 2.95 (H) 06/18/2024   CREATININE 3.34 (H) 06/17/2024     Intake/Output Summary (Last 24 hours) at 06/20/2024 1538 Last data filed at 06/20/2024 1500 Gross per 24 hour  Intake 1378.54 ml  Output 1500 ml  Net -121.46 ml      2. Anemia of chronic kidney disease Recent Labs       Lab Results  Component Value Date    HGB 8.8 (L) 06/18/2024    Hemoglobin slightly decreased. Continue to monitor and assess need for ESA.   3. Acute metabolic acidosis, likely secondary to kidney injury.  S bicarb remains 20. Continue oral supplementation.    4. Diabetes mellitus type II with chronic kidney disease/renal manifestations: insulin  dependent. Home regimen includes NovoLog  and Lantus . Most recent hemoglobin A1c is 9.2 on 05/25/2024.              Glucose elevated at times     LOS: 7 Crist Kruszka SHAUNNA Dines 8/2/20253:38 PM

## 2024-06-20 NOTE — Progress Notes (Addendum)
 Progress Note   Patient: Richard Mendoza FMW:[id number] DOB: 03/21/1976 DOA: 06/13/2024     7 DOS: the patient was seen and examined on 06/20/2024   Brief hospital course:  [name] is a 48 y.o. male with medical history significant of status post left AKA, PVD on Eliquis , DM-type I on insulin  pump, hypertension, chronic pain syndrome, CKD-3a, HLD, tobacco abuse, who presents with scrotal pain and fever.  Pt state that 3 days ago he had some irritation in his perineal area that he thought may be related to an ingrown hair.  Ever since then, he has developed pain and swelling in the left scrotal area, as well as of his penile foreskin.  The pain is constant, aching, severe, nonradiating, not aggravated or alleviated by any known factors. He has difficulty retracting his foreskin with difficult urinating, but still able to void.  He developed fever and chills.  His temperature is 101.2 in the ED.  Patient does not have chest pain, cough, SOB.  No nausea, vomiting, diarrhea or abdominal pain.   Data reviewed independently and ED Course: pt was found to have WBC 13.7, worsening renal function, lactic acid 2.2 --> 2.0, temperature 101.2, blood pressure 174/114, heart rate 120-130, RR 16, oxygen saturation 100% on room air.  Patient is admitted to telemetry bed as inpatient.  ED physician discussed with Dr. Nieves of urology.   CT of abdomen/pelvis: 1. Diffuse scrotal wall thickening and inflammatory changes in the left perineum. No drainable fluid collection or abscess. No soft tissue gas. 2.  Aortic Atherosclerosis (ICD10-I70.0).   EKG: I have personally reviewed.  Sinus rhythm, QTc 482, bifascicular block        Assessment and Plan:  Sepsis due to cellulitis of scrotum:   Patient met criteria for sepsis on admission with WBC 13.7, temperature 101.2, heart rate up to 130s.  Lactic acid 2.2 -> 2.0.  ED patient discussed with Dr. Nieves of urology.  Recommended reconsultation  as needed Patient seen in consultation by Dr Francisca 07/28, agrees with continuing IV antibiotic therapy and recommends to hold patient's Eliquis  for possible procedure. Vancomycin  and Zosyn  were discontinued due to worsening renal function  Patient is currently on linezolid  and Zosyn  per ID recommendation 07/29 - Repeat CT scan of the pelvis without contrast showed worsened scrotal swelling with findings concerning for developing abscess in the inferior left scrotum. No soft tissue gas. 07/30 - Appreciate urology input, patient is status post incision and drainage of scrotal abscess Surgical wound culture revealed Klebsiella species.  Continue local wound care.Urology performed initial dressing and recommends to continue wet-to-dry wound care twice daily        Essential hypertension Lasix  and losartan are on hold due to worsening renal function Continue amlodipine  for blood pressure control       Acute kidney injury superimposed on stage IIIb chronic kidney disease Unclear etiology and may be medication induced versus postobstructive At baseline patient has a serum creatinine of 2.1>> 3.76 >> 4.55 >> 2.95 >> 2.74 Continue to hold losartan and Lasix   Appreciate nephrology input Renal ultrasound is negative for hydronephrosis Renal function is improved but not back to baseline         Acute anemia superimposed on anemia of chronic kidney disease Iron  deficiency anemia Noted to have a hemoglobin of 7.9 from a baseline of 13 Most likely hemodilutional Serum iron  level is 8 Will need referral to GI as an outpatient       Acute Urinary  Retention Patient with complaints of difficulty voiding Bladder scan yielded > 300cc of urine Foley catheter has been discontinued Patient has been able to void     PAD (peripheral artery disease) (HCC) Continue Eliquis , Lipitor     Type 1 diabetes mellitus with diabetic neuropathy [facility]):  Recent A1c 9.2, poorly controlled.   Received  systemic steroids resulting in worsening hyperglycemia Improved glycemic control. At risk for hypoglycemia Continue long-acting insulin  but decrease to 40 units daily Continue NovoLog  5 units with meals Hold scheduled Novolog  Maintain consistent carbohydrate diet     Chronic diastolic CHF (congestive heart failure) (HCC):  2D Echo on 08/28/2023 showed EF of 55-60% with grade 1 diastolic dysfunction. CHF is compensated. Hold Lasix  and losartan due to AKI     HLD (hyperlipidemia) -Lipitor, fenofibrate        Chronic pain syndrome -Continue home oral Dilaudid , lyrica      Depression -Continue sertraline  and trazodone      Overweight (BMI 25.0-29.9): Body weight 92 kg, BMI 29.10 - Encourage losing weight - Exercise and healthy diet      Physical Deconditioning Secondary to prolonged hospitalization PT recommends SNF      Subjective: Patient is seen and examined at the bedside  Physical Exam: Vitals:   06/20/24 0358 06/20/24 0500 06/20/24 0804 06/20/24 1213  BP: (!) 172/87  (!) 168/79 (!) 167/88  Pulse: 72  73 80  Resp: 18  18   Temp: 97.9 F (36.6 C)  98.1 F (36.7 C) 98.6 F (37 C)  TempSrc:      SpO2: 96%  96% 99%  Weight:  95.7 kg    Height:       General: Not in acute distress HEENT:       Eyes: PERRL, EOMI, no jaundice       ENT: No discharge from the ears and nose, no pharynx injection, no tonsillar enlargement.        Neck: No JVD, no bruit, no mass felt. Heme: No neck lymph node enlargement. Cardiac: S1/S2, RRR, No murmurs, No gallops or rubs. Respiratory: No rales, wheezing, rhonchi or rubs. GI: Soft, nondistended, nontender, no rebound pain, no organomegaly, BS present. GU: has erythema, tenderness, warmth, swelling to left scrotum and penile foreskin.  No fluctuance.           Ext: No pitting leg edema bilaterally. S/p of left AKA Musculoskeletal: No joint deformities, No joint redness or warmth, no limitation of ROM in spin. Skin: No  rashes.  Neuro: Alert, oriented X3, cranial nerves II-XII grossly intact, moves all extremities normally. Psych: Patient is not psychotic, no suicidal or hemocidal ideation.   Data Reviewed:  Labs reviewed  Family Communication: Plan of care was discussed with patient in detail. He verbalizes understanding and agrees with the plan  Disposition: Status is: Inpatient Remains inpatient appropriate because: awaiting SNF placement  Planned Discharge Destination: Skilled nursing facility    Time spent: 40 minutes  Author: Aimee Somerset, MD 06/20/2024 2:47 PM  For on call review [email or web address].

## 2024-06-20 NOTE — Plan of Care (Signed)
  Problem: Clinical Measurements: Goal: Ability to maintain clinical measurements within normal limits will improve 06/20/2024 1411 by Val Michaelle SAILOR, RN Outcome: Progressing 06/20/2024 1411 by Val Michaelle SAILOR, RN Outcome: Progressing Goal: Will remain free from infection 06/20/2024 1411 by Val Michaelle SAILOR, RN Outcome: Progressing 06/20/2024 1411 by Val Michaelle SAILOR, RN Outcome: Progressing Goal: Diagnostic test results will improve 06/20/2024 1411 by Val Michaelle SAILOR, RN Outcome: Progressing 06/20/2024 1411 by Val Michaelle SAILOR, RN Outcome: Progressing Goal: Respiratory complications will improve 06/20/2024 1411 by Val Michaelle SAILOR, RN Outcome: Progressing 06/20/2024 1411 by Val Michaelle SAILOR, RN Outcome: Progressing Goal: Cardiovascular complication will be avoided 06/20/2024 1411 by Val Michaelle SAILOR, RN Outcome: Progressing 06/20/2024 1411 by Val Michaelle SAILOR, RN Outcome: Progressing

## 2024-06-21 DIAGNOSIS — N492 Inflammatory disorders of scrotum: Secondary | ICD-10-CM | POA: Diagnosis not present

## 2024-06-21 LAB — RENAL FUNCTION PANEL
Albumin: 2.6 g/dL — ABNORMAL LOW (ref 3.5–5.0)
Anion gap: 9 (ref 5–15)
BUN: 22 mg/dL — ABNORMAL HIGH (ref 6–20)
CO2: 22 mmol/L (ref 22–32)
Calcium: 8.9 mg/dL (ref 8.9–10.3)
Chloride: 109 mmol/L (ref 98–111)
Creatinine, Ser: 2.12 mg/dL — ABNORMAL HIGH (ref 0.61–1.24)
GFR, Estimated: 38 mL/min — ABNORMAL LOW (ref >60)
Glucose, Bld: 141 mg/dL — ABNORMAL HIGH (ref 70–99)
Phosphorus: 2.6 mg/dL (ref 2.5–4.6)
Potassium: 3.8 mmol/L (ref 3.5–5.1)
Sodium: 140 mmol/L (ref 135–145)

## 2024-06-21 LAB — GLUCOSE, CAPILLARY
Glucose-Capillary: 121 mg/dL — ABNORMAL HIGH (ref 70–99)
Glucose-Capillary: 208 mg/dL — ABNORMAL HIGH (ref 70–99)
Glucose-Capillary: 232 mg/dL — ABNORMAL HIGH (ref 70–99)
Glucose-Capillary: 270 mg/dL — ABNORMAL HIGH (ref 70–99)

## 2024-06-21 LAB — CBC
HCT: 31.1 % — ABNORMAL LOW (ref 39.0–52.0)
Hemoglobin: 10.2 g/dL — ABNORMAL LOW (ref 13.0–17.0)
MCH: 25.1 pg — ABNORMAL LOW (ref 26.0–34.0)
MCHC: 32.8 g/dL (ref 30.0–36.0)
MCV: 76.6 fL — ABNORMAL LOW (ref 80.0–100.0)
Platelets: 366 K/uL (ref 150–400)
RBC: 4.06 MIL/uL — ABNORMAL LOW (ref 4.22–5.81)
RDW: 16.1 % — ABNORMAL HIGH (ref 11.5–15.5)
WBC: 9.5 K/uL (ref 4.0–10.5)
nRBC: 0 % (ref 0.0–0.2)

## 2024-06-21 LAB — MAGNESIUM: Magnesium: 2 mg/dL (ref 1.7–2.4)

## 2024-06-21 MED ORDER — METOCLOPRAMIDE HCL 5 MG/ML IJ SOLN
10.0000 mg | Freq: Three times a day (TID) | INTRAMUSCULAR | Status: AC
  Administered 2024-06-21 – 2024-06-22 (×3): 10 mg via INTRAVENOUS
  Filled 2024-06-21 (×3): qty 2

## 2024-06-21 MED ORDER — HYDRALAZINE HCL 50 MG PO TABS
50.0000 mg | ORAL_TABLET | Freq: Three times a day (TID) | ORAL | Status: DC
  Administered 2024-06-21 – 2024-06-25 (×12): 50 mg via ORAL
  Filled 2024-06-21 (×12): qty 1

## 2024-06-21 MED ORDER — HYDROMORPHONE HCL 1 MG/ML IJ SOLN
1.0000 mg | Freq: Once | INTRAMUSCULAR | Status: AC
  Administered 2024-06-21: 1 mg via INTRAVENOUS
  Filled 2024-06-21: qty 1

## 2024-06-21 NOTE — Plan of Care (Signed)

## 2024-06-21 NOTE — Progress Notes (Signed)
 [facility]  ROUNDING NOTE   Subjective:  Admitted with scrotal abscess requiring I&d by urology. Foley catheter placed. Followed by ID. Placed on pip/tazo and linazolid.  Creatinine trending down. Follows outpatient with Dr. Dennise with [facility] [place].  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 Will need outpatient follow up. Discussed role of SGLT2 inhibitors and MRA agents in slowing the progression of kidney disease.  Currently holding home benazepril .  Update: No acute events overnight. On room air. Creatinine 2.12   Objective:  Vital signs in last 24 hours:  Temp:  [97.9 F (36.6 C)-98.3 F (36.8 C)] 97.9 F (36.6 C) (08/03 0822) Pulse Rate:  [79-84] 83 (08/03 1003) Resp:  [20] 20 (08/03 0822) BP: (165-194)/(88-95) 179/89 (08/03 1003) SpO2:  [98 %-99 %] 98 % (08/03 0822) Weight:  [89.6 kg] 89.6 kg (08/03 0500)  Weight change: -6.1 kg Filed Weights   06/19/24 0500 06/20/24 0500 06/21/24 0500  Weight: 91.6 kg 95.7 kg 89.6 kg    Intake/Output: I/O last 3 completed shifts: In: 1558.5 [P.O.:420; IV Piggyback:1138.5] Out: 3700 [Urine:3700]   Intake/Output this shift:  Total I/O In: 671.7 [P.O.:240; IV Piggyback:431.7] Out: -   Physical Exam: General: NAD,   Head: Normocephalic  Eyes: Anicteric  Neck: Supple  Lungs:  Clear, room air  Heart: Regular rate   Abdomen:  Soft, nontender  Extremities: No peripheral edema.  Neurologic: Alert and oriented  Skin: No rashes  Access: None    Basic Metabolic Panel: Recent Labs  Lab 06/16/24 0914 06/17/24 0817 06/18/24 0902 06/19/24 0420 06/21/24 0633  NA 131* 139 137 139 140  K 4.6 4.1 4.3 4.1 3.8  CL 104 110 106 112* 109  CO2 17* 22 20* 20* 22  GLUCOSE 321* 141* 337* 197* 141*  BUN 45* 35* 35* 34* 22*  CREATININE 4.55* 3.34* 2.95* 2.74* 2.12*  CALCIUM  8.5* 8.9 9.1 8.5* 8.9  MG  --   --   --   --  2.0  PHOS 3.6 3.2  --   --  2.6    Liver Function Tests: Recent Labs  Lab  06/16/24 0912 06/16/24 0914 06/17/24 0817 06/21/24 0633  AST 14*  --   --   --   ALT 10  --   --   --   ALKPHOS 79  --   --   --   BILITOT 0.5  --   --   --   PROT 5.4*  --   --   --   ALBUMIN 2.0* 2.3* 2.3* 2.6*   No results for input(s): LIPASE, AMYLASE in the last 168 hours. No results for input(s): AMMONIA in the last 168 hours.  CBC: Recent Labs  Lab 06/15/24 0503 06/16/24 0914 06/17/24 1028 06/18/24 0951 06/21/24 0633  WBC 13.2* 13.4* 11.7* 11.3* 9.5  HGB 7.9* 7.6* 8.1* 8.8* 10.2*  HCT 24.8* 23.9* 24.9* 27.8* 31.1*  MCV 79.0* 77.6* 77.1* 77.4* 76.6*  PLT 157 186 259 302 366    Cardiac Enzymes: No results for input(s): CKTOTAL, CKMB, CKMBINDEX, TROPONINI in the last 168 hours.  BNP: Invalid input(s): POCBNP  CBG: Recent Labs  Lab 06/20/24 1209 06/20/24 1659 06/20/24 2142 06/21/24 0824 06/21/24 1239  GLUCAP 109* 166* 170* 121* 208*    Microbiology: Results for orders placed or performed during the hospital encounter of 06/13/24  Blood Culture (routine x 2)     Status: None   Collection Time: 06/13/24  3:32 PM   Specimen: BLOOD RIGHT ARM  Result Value Ref Range Status   Specimen Description BLOOD RIGHT ARM  Final   Special Requests   Final    BOTTLES DRAWN AEROBIC AND ANAEROBIC Blood Culture results may not be optimal due to an inadequate volume of blood received in culture bottles   Culture   Final    NO GROWTH 5 DAYS Performed at [facility], [address] [place], [place] [address]    Report Status 06/18/2024 FINAL  Final  Blood Culture (routine x 2)     Status: None   Collection Time: 06/13/24  4:37 PM   Specimen: BLOOD  Result Value Ref Range Status   Specimen Description BLOOD BLOOD LEFT FOREARM  Final   Special Requests   Final    BOTTLES DRAWN AEROBIC AND ANAEROBIC Blood Culture results may not be optimal due to an inadequate volume of blood received in culture bottles   Culture   Final    NO GROWTH 5  DAYS Performed at [facility], [address] [place], [place] [address]    Report Status 06/18/2024 FINAL  Final  Aerobic/Anaerobic Culture w Gram Stain (surgical/deep wound)     Status: None (Preliminary result)   Collection Time: 06/17/24 12:12 PM   Specimen: Wound; Tissue  Result Value Ref Range Status   Specimen Description TISSUE SCROTUM  Final   Special Requests  FROM SCROTAL ABSCESS  Final   Gram Stain   Final    NO WBC SEEN FEW GRAM POSITIVE COCCI FEW GRAM POSITIVE RODS RARE GRAM NEGATIVE RODS    Culture   Final    RARE KLEBSIELLA PNEUMONIAE Confirmed Extended Spectrum Beta-Lactamase Producer (ESBL).  In bloodstream infections from ESBL organisms, carbapenems are preferred over piperacillin /tazobactam. They are shown to have a lower risk of mortality. Two isolates with different morphologies were identified as the same organism.The most resistant organism was reported. FEW PREVOTELLA BIVIA BETA LACTAMASE POSITIVE CULTURE REINCUBATED FOR BETTER GROWTH Performed at [facility], [address] [place], [place] [address]    Report Status PENDING  Incomplete   Organism ID, Bacteria KLEBSIELLA PNEUMONIAE  Final      Susceptibility   Klebsiella pneumoniae - MIC*    AMPICILLIN  >=32 RESISTANT Resistant     CEFEPIME  <=0.12 SENSITIVE Sensitive     CEFTAZIDIME RESISTANT Resistant     CEFTRIAXONE  <=0.25 SENSITIVE Sensitive     CIPROFLOXACIN  <=0.25 SENSITIVE Sensitive     GENTAMICIN <=1 SENSITIVE Sensitive     IMIPENEM 1 SENSITIVE Sensitive     TRIMETH /SULFA  <=20 SENSITIVE Sensitive     AMPICILLIN /SULBACTAM >=32 RESISTANT Resistant     PIP/TAZO <=4 SENSITIVE Sensitive ug/mL    * RARE KLEBSIELLA PNEUMONIAE  Aerobic/Anaerobic Culture w Gram Stain (surgical/deep wound)     Status: None (Preliminary result)   Collection Time: 06/17/24 12:13 PM   Specimen: Wound; Abscess  Result Value Ref Range Status   Specimen Description   Final    WOUND Performed at  [facility], [address], [place], [place] [address]    Special Requests   Final     SCROTAL ABSCESS Performed at [facility], [address] [place], [place] [address]    Gram Stain   Final    ABUNDANT WBC PRESENT, PREDOMINANTLY PMN MODERATE GRAM POSITIVE COCCI FEW GRAM NEGATIVE RODS RARE GRAM POSITIVE RODS    Culture   Final    RARE KLEBSIELLA PNEUMONIAE MODERATE PREVOTELLA BIVIA BETA LACTAMASE POSITIVE CULTURE REINCUBATED FOR BETTER GROWTH Performed at  [facility], [address] [place], [place] [address]    Report Status PENDING  Incomplete   Organism ID, Bacteria KLEBSIELLA PNEUMONIAE  Final      Susceptibility   Klebsiella pneumoniae - MIC*    AMPICILLIN  >=32 RESISTANT Resistant     CEFEPIME  <=0.12 SENSITIVE Sensitive     CEFTAZIDIME <=1 SENSITIVE Sensitive     CEFTRIAXONE  <=0.25 SENSITIVE Sensitive     CIPROFLOXACIN  <=0.25 SENSITIVE Sensitive     GENTAMICIN <=1 SENSITIVE Sensitive     IMIPENEM <=0.25 SENSITIVE Sensitive     TRIMETH /SULFA  <=20 SENSITIVE Sensitive     AMPICILLIN /SULBACTAM 4 SENSITIVE Sensitive     PIP/TAZO <=4 SENSITIVE Sensitive ug/mL    * RARE KLEBSIELLA PNEUMONIAE    Coagulation Studies: No results for input(s): LABPROT, INR in the last 72 hours.  Urinalysis: No results for input(s): COLORURINE, LABSPEC, PHURINE, GLUCOSEU, HGBUR, BILIRUBINUR, KETONESUR, PROTEINUR, UROBILINOGEN, NITRITE, LEUKOCYTESUR in the last 72 hours.  Invalid input(s): APPERANCEUR    Imaging: No results found.   Medications:    piperacillin -tazobactam (ZOSYN )  IV 3.375 g (06/21/24 0504)    amLODipine   10 mg Oral Daily   ascorbic acid   500 mg Oral Daily   atorvastatin   40 mg Oral Daily   Chlorhexidine  Gluconate Cloth  6 each Topical Daily   cyanocobalamin   1,000 mcg Oral Daily   enoxaparin  (LOVENOX ) injection  40 mg Subcutaneous Q24H   famotidine   10 mg Oral BID   feeding supplement (GLUCERNA SHAKE)   237 mL Oral TID BM   fenofibrate   54 mg Oral Daily   ferrous sulfate   325 mg Oral Q breakfast   insulin  aspart  5 Units Subcutaneous TID WC   insulin  glargine-yfgn  40 Units Subcutaneous QHS   metoCLOPramide  (REGLAN ) injection  10 mg Intravenous Q8H   pantoprazole   40 mg Oral Daily   pregabalin   75 mg Oral BID   sertraline   100 mg Oral Daily   sodium bicarbonate   650 mg Oral BID   traZODone   100 mg Oral QHS   acetaminophen , HYDROmorphone , hydrOXYzine , loperamide  HCl, ondansetron  (ZOFRAN ) IV  Assessment/ Plan:  [name] is a 48 y.o.  male with past medical conditions including PVD on Eliquis , type 1 diabetes with insulin  pump, hypertension, hyperlipidemia, left AKA, CKD stage III B,, who was admitted to [facility] on 06/13/2024 for Cellulitis of perineum [L03.315] Cellulitis of scrotum [N49.2]     Acute kidney injury on chronic kidney disease stage IIIb.  Baseline creatinine appears to be 2.09 with GFR 39 on 02/05/2024.  Acute kidney injury likely secondary to infection.  Creatinine 3.16 on admission.  CT pelvis negative for obstruction.  No IV contrast exposure.  No acute indication for dialysis.  Renal function continues to improve.  Continue to encouraged oral intake. Continuing to monitor renal indices Lab Results  Component Value Date   CREATININE 2.12 (H) 06/21/2024   CREATININE 2.74 (H) 06/19/2024   CREATININE 2.95 (H) 06/18/2024     Intake/Output Summary (Last 24 hours) at 06/21/2024 1257 Last data filed at 06/21/2024 0900 Gross per 24 hour  Intake 1990.28 ml  Output 1600 ml  Net 390.28 ml      2. Anemia of chronic kidney disease Recent Labs           Lab Results  Component Value Date    HGB 10.2 (L) 06/21/2024    Hemoglobin improved No need for ESA for now.   3. Acute metabolic acidosis, likely secondary  to kidney injury.  S bicarb 22. Resolved. Sodium bicarbonate  discontinued.   4. Diabetes mellitus type II with chronic kidney disease/renal manifestations:  insulin  dependent. Home regimen includes NovoLog  and Lantus . Most recent hemoglobin A1c is 9.2 on 05/25/2024.              Glucose elevated at times   LOS: 8 Preeti Winegardner SHAUNNA Dines 8/3/202512:54 PM

## 2024-06-21 NOTE — TOC Initial Note (Signed)
 Transition of Care [facility]) - Initial/Assessment Note    Patient Details  Name: Richard Mendoza MRN: [id number] Date of Birth: Feb 13, 1976  [facility]) CM/SW Contact:    Indie Nickerson E Didi Ganaway, LCSW Phone Number: 06/21/2024, 11:31 AM  Clinical Narrative:                 CSW spoke with patient. Patient lives alone. His mother provides transport. PCP is Dr. Carles. Patient states he has all needed DME at home, however feels he could benefit from a new wheelchair - patient to ask for one to be ordered when he leaves STR.  Patient is agreeable to SNF rec for STR, he has been to [facility] in March. He prefers somewhere closer to his home (address in chart is correct per patient). CSW started SNF work up. PASRR IS PENDING.    Expected Discharge Plan: Skilled Nursing Facility Barriers to Discharge: Continued Medical Work up   Patient Goals and CMS Choice   CMS Medicare.gov Compare Post Acute Care list provided to:: Patient Choice offered to / list presented to : Patient      Expected Discharge Plan and Services       Living arrangements for the past 2 months: Single Family Home                                      Prior Living Arrangements/Services Living arrangements for the past 2 months: Single Family Home Lives with:: Self Patient language and need for interpreter reviewed:: Yes Do you feel safe going back to the place where you live?: Yes      Need for Family Participation in Patient Care: Yes (Comment) Care giver support system in place?: Yes (comment) Current home services: DME Criminal Activity/Legal Involvement Pertinent to Current Situation/Hospitalization: No - Comment as needed  Activities of Daily Living   ADL Screening (condition at time of admission) Independently performs ADLs?: Yes (appropriate for developmental age) Is the patient deaf or have difficulty hearing?: Yes Does the patient have difficulty seeing, even when wearing  glasses/contacts?: No Does the patient have difficulty concentrating, remembering, or making decisions?: No  Permission Sought/Granted Permission sought to share information with : Facility Industrial/product designer granted to share information with : Yes, Verbal Permission Granted     Permission granted to share info w AGENCY: SNFs        Emotional Assessment       Orientation: : Oriented to Self, Oriented to Place, Oriented to  Time, Oriented to Situation Alcohol / Substance Use: Not Applicable Psych Involvement: No (comment)  Admission diagnosis:  Cellulitis of perineum [L03.315] Cellulitis of scrotum [N49.2] Patient Active Problem List   Diagnosis Date Noted   Scrotal abscess 06/16/2024   Cellulitis of perineum 06/15/2024   Cellulitis of scrotum 06/13/2024   Sepsis (HCC) 06/13/2024   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) 06/13/2024   Chronic diastolic CHF (congestive heart failure) (HCC) 06/13/2024   Depression 06/13/2024   Acute renal failure superimposed on stage 3b chronic kidney disease (HCC) 06/13/2024   Dehydration 05/23/2024   S/P AKA (above knee amputation), left (HCC) 02/04/2024   Staphylococcal arthritis of right shoulder (HCC) 08/29/2023   Acute bursitis of right shoulder 08/24/2023   Iron  deficiency anemia, unspecified 08/23/2023   Overweight (BMI 25.0-29.9) 08/22/2023   Amputation stump infection (HCC) 08/20/2023   Infection of amputation stump of left lower extremity (  HCC) 06/01/2023   Chronic kidney disease, stage 3a (HCC) 06/01/2023   Tobacco abuse 06/01/2023   Cellulitis and abscess of left leg 04/30/2023   Cellulitis of left lower extremity 04/29/2023   Carpal tunnel syndrome 07/12/2022   Polyneuropathy 07/12/2022   Ulnar neuropathy 07/12/2022   Acute epigastric pain 07/12/2022   Gastro-esophageal reflux disease without esophagitis 07/12/2022   Nausea and vomiting 07/12/2022   Abscess of right upper extremity 02/26/2022    Cellulitis of left lower extremity without foot    Hyponatremia 09/06/2021   PAD (peripheral artery disease) (HCC) 05/26/2021   Acute kidney injury superimposed on chronic kidney disease (HCC) 04/03/2021   Syncope 04/03/2021   Neck pain    Abnormal weight loss 12/09/2020   Change in bowel habit 12/09/2020   Constipation 12/09/2020   Epigastric pain 12/09/2020   Drug-induced constipation 12/09/2020   Periumbilical pain 12/09/2020   Fatty liver 12/09/2020   S/P BKA (below knee amputation), left (HCC) 11/20/2020   Necrotizing fasciitis of ankle and foot (HCC) 11/07/2020   Major depressive disorder, recurrent, in remission (HCC) 09/01/2020   Pain management contract signed 09/07/2019   Evaluation by psychiatric service required 08/18/2019   History of depression 08/18/2019   Primary osteoarthritis of both shoulders 07/16/2019   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) 07/16/2019   Chronic pain of both shoulders 07/16/2019   Cervical radicular pain 07/16/2019   S/P cervical spinal fusion 07/16/2019   Cervical spondylosis 07/16/2019   Cervical facet joint syndrome 07/16/2019   Chronic pain syndrome 07/16/2019   Anemia of chronic disease 05/25/2019   Osteomyelitis (HCC) 05/25/2019   Diabetic foot infection (HCC) 03/05/2019   Abscess 11/25/2018   Hyperlipidemia associated with type 2 diabetes mellitus (HCC) 06/16/2018   Intractable nausea and vomiting 10/21/2017   HNP (herniated nucleus pulposus), cervical 09/09/2017   Displacement of cervical intervertebral disc without myelopathy 08/27/2017   Erectile dysfunction 07/31/2017   Gastroesophageal reflux disease 07/31/2017   Rotator cuff syndrome of left shoulder 07/31/2017   Cellulitis of right leg 07/30/2016   Tobacco abuse disorder 07/30/2016   Routine general medical examination at a health care facility 01/20/2014   Diabetic neuropathy, type I diabetes mellitus (HCC) 01/20/2014   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   HLD  (hyperlipidemia) 03/17/2009   Uncontrolled diabetes mellitus with hyperglycemia (HCC) 12/08/2008   OBSTRUCTIVE SLEEP APNEA 12/08/2008   Essential hypertension 12/08/2008   Allergic rhinitis 12/15/2007   Other malaise and fatigue 12/15/2007   PCP:  Carles Seltzer, MD Pharmacy:   [id number] - [place], [place] - [address] [place] [address] Phone: [phone] Fax: [phone]  [id number] - [place], [place] - [address] AT [facility] [address] [place] [address] Phone: [phone] Fax: [phone]  [id number] [place], [place] - [address] AT [facility] [address] [place] [address] Phone: [phone] Fax: [phone]  [facility] [address] [place] [address] Phone: [phone] Fax: [phone]  [facility] - [place], [place] - [address] [place] [address] Phone: [phone] Fax: [phone]  [facility] [id number] - [place], [place] - [address] [place] [address] Phone: [phone] Fax: [phone]     Social Drivers of Health (SDOH) Social History: SDOH Screenings   Food Insecurity: No Food Insecurity (  06/14/2024)  Housing: High Risk (06/14/2024)  Transportation Needs: Unmet Transportation Needs (06/14/2024)  Utilities: Not At Risk (06/14/2024)  Depression (PHQ2-9): High Risk (05/19/2024)  Financial Resource Strain: Low Risk  (07/24/2022)   Received from [facility]  Social Connections: Socially Isolated (06/14/2024)  Stress: No Stress Concern Present (07/24/2022)   Received from [facility]  Tobacco Use: Medium Risk (06/13/2024)   SDOH Interventions:     Readmission Risk Interventions    01/24/2024   12:35 PM 08/21/2023    9:03 AM  Readmission Risk Prevention  Plan  Transportation Screening Complete Complete  PCP or Specialist Appt within 3-5 Days Complete Complete  HRI or Home Care Consult Complete   Social Work Consult for Recovery Care Planning/Counseling Complete Complete  Palliative Care Screening Not Applicable Not Applicable  Medication Review Oceanographer) Complete Complete

## 2024-06-21 NOTE — Progress Notes (Signed)
 Progress N I know well but admittedly said was good ote   Patient: Richard Mendoza FMW:[id number] DOB: 06/11/1976 DOA: 06/13/2024     8 DOS: the patient was seen and examined on 06/21/2024   Brief hospital course:  Richard Mendoza is a 48 y.o. male with medical history significant of status post left AKA, PVD on Eliquis , DM-type I on insulin  pump, hypertension, chronic pain syndrome, CKD-3a, HLD, tobacco abuse, who presents with scrotal pain and fever.  Pt state that 3 days ago he had some irritation in his perineal area that he thought may be related to an ingrown hair.  Ever since then, he has developed pain and swelling in the left scrotal area, as well as of his penile foreskin.  The pain is constant, aching, severe, nonradiating, not aggravated or alleviated by any known factors. He has difficulty retracting his foreskin with difficult urinating, but still able to void.  He developed fever and chills.  His temperature is 101.2 in the ED.  Patient does not have chest pain, cough, SOB.  No nausea, vomiting, diarrhea or abdominal pain.   Data reviewed independently and ED Course: pt was found to have WBC 13.7, worsening renal function, lactic acid 2.2 --> 2.0, temperature 101.2, blood pressure 174/114, heart rate 120-130, RR 16, oxygen saturation 100% on room air.  Patient is admitted to telemetry bed as inpatient.  ED physician discussed with Dr. Nieves of urology.   CT of abdomen/pelvis: 1. Diffuse scrotal wall thickening and inflammatory changes in the left perineum. No drainable fluid collection or abscess. No soft tissue gas. 2.  Aortic Atherosclerosis (ICD10-I70.0).   EKG: I have personally reviewed.  Sinus rhythm, QTc 482, bifascicular block      Assessment and Plan:   Sepsis due to cellulitis of scrotum:   Patient met criteria for sepsis on admission with WBC 13.7, temperature 101.2, heart rate up to 130s.  Lactic acid 2.2 -> 2.0.  ED patient discussed with Dr.  Nieves of urology.  Recommended reconsultation as needed Patient seen in consultation by Dr Francisca 07/28, agrees with continuing IV antibiotic therapy and recommends to hold patient's Eliquis  for possible procedure. Vancomycin  and Zosyn  were discontinued due to worsening renal function  Patient is currently on linezolid  and Zosyn  per ID recommendation 07/29 - Repeat CT scan of the pelvis without contrast showed worsened scrotal swelling with findings concerning for developing abscess in the inferior left scrotum. No soft tissue gas. 07/30 - Appreciate urology input, patient is status post incision and drainage of scrotal abscess Surgical wound culture revealed Klebsiella species.  Continue local wound care.Urology performed initial dressing and recommends to continue wet-to-dry wound care twice daily         Essential hypertension Lasix  and losartan are on hold due to worsening renal function Continue amlodipine  and hydralazine  to optimize blood pressure control       Acute kidney injury superimposed on stage IIIb chronic kidney disease Unclear etiology and may be medication induced versus postobstructive At baseline patient has a serum creatinine of 2.1>> 3.76 >> 4.55 >> 2.95 >> 2.74 >> 2.12 Continue to hold losartan and Lasix   Appreciate nephrology input Renal ultrasound is negative for hydronephrosis Renal function is improved but not back to baseline         Acute anemia superimposed on anemia of chronic kidney disease Iron  deficiency anemia Noted to have a hemoglobin of 7.9 from a baseline of 13 Most likely hemodilutional Serum iron  level is 8 Will need  referral to GI as an outpatient       Acute Urinary Retention Patient with complaints of difficulty voiding Bladder scan yielded > 300cc of urine Foley catheter has been discontinued Patient has been able to void     PAD (peripheral artery disease) (HCC) Continue Eliquis , Lipitor     Type 1 diabetes mellitus  with diabetic neuropathy (HCC):  ?? Diabetic Gastroparesis Recent A1c 9.2, poorly controlled.   Received systemic steroids resulting in worsening hyperglycemia Improved glycemic control. At risk for hypoglycemia Continue long-acting insulin  but decrease to 40 units daily Hold scheduled Novolog  Maintain consistent carbohydrate diet Trial of Reglan      Chronic diastolic CHF (congestive heart failure) (HCC):  2D Echo on 08/28/2023 showed EF of 55-60% with grade 1 diastolic dysfunction. CHF is compensated. Hold Lasix  and losartan due to AKI     HLD (hyperlipidemia) -Lipitor, fenofibrate        Chronic pain syndrome -Continue home oral Dilaudid , lyrica      Depression -Continue sertraline  and trazodone      Overweight (BMI 25.0-29.9): Body weight 92 kg, BMI 29.10 - Encourage losing weight - Exercise and healthy diet       Physical Deconditioning Secondary to prolonged hospitalization PT recommends SNF        Subjective: Patient is seen and examined at the bedside. Complains of nausea  Physical Exam: Vitals:   06/21/24 0419 06/21/24 0500 06/21/24 0822 06/21/24 1003  BP: (!) 186/88  (!) 194/95 (!) 179/89  Pulse: 81  79 83  Resp: 20  20   Temp: 98.3 F (36.8 C)  97.9 F (36.6 C)   TempSrc:      SpO2: 99%  98%   Weight:  89.6 kg    Height:        General: Not in acute distress HEENT:       Eyes: PERRL, EOMI, no jaundice       ENT: No discharge from the ears and nose, no pharynx injection, no tonsillar enlargement.        Neck: No JVD, no bruit, no mass felt. Heme: No neck lymph node enlargement. Cardiac: S1/S2, RRR, No murmurs, No gallops or rubs. Respiratory: No rales, wheezing, rhonchi or rubs. GI: Soft, nondistended, nontender, no rebound pain, no organomegaly, BS present. GU: has erythema, tenderness, warmth, swelling to left scrotum and penile foreskin.  No fluctuance.           Ext: No pitting leg edema bilaterally. S/p of left  AKA Musculoskeletal: No joint deformities, No joint redness or warmth, no limitation of ROM in spin. Skin: No rashes.  Neuro: Alert, oriented X3, cranial nerves II-XII grossly intact, moves all extremities normally. Psych: Patient is not psychotic, no suicidal or hemocidal ideation.     Data Reviewed: Creatinine 2.12  Labs reviewed  Family Communication: Plan of care was discussed with patient in detail. He verbalizes understanding and agrees with the plan.  Disposition: Status is: Inpatient Remains inpatient appropriate because: Trial of Reglan  for persistent nausea and poor oral intake  Planned Discharge Destination: Skilled nursing facility    Time spent: 40 minutes  Author: Aimee Somerset, MD 06/21/2024 2:06 PM  For on call review [email or web address].

## 2024-06-21 NOTE — NC FL2 (Signed)
 [place]  MEDICAID FL2 LEVEL OF CARE FORM     IDENTIFICATION  Patient Name: Richard Mendoza Birthdate: 02/03/1976 Sex: male Admission Date (Current Location): 06/13/2024  [facility] and [place] Number:  Chiropodist and Address:  [facility], [address] [place], [place] [address]      Provider Number: [id number]  Attending Physician Name and Address:  Lanetta Lingo, MD  Relative Name and Phone Number:       Current Level of Care: Hospital Recommended Level of Care: Skilled Nursing Facility Prior Approval Number:    Date Approved/Denied:   PASRR Number: **pending  Discharge Plan:      Current Diagnoses: Patient Active Problem List   Diagnosis Date Noted   Scrotal abscess 06/16/2024   Cellulitis of perineum 06/15/2024   Cellulitis of scrotum 06/13/2024   Sepsis (HCC) 06/13/2024   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) 06/13/2024   Chronic diastolic CHF (congestive heart failure) (HCC) 06/13/2024   Depression 06/13/2024   Acute renal failure superimposed on stage 3b chronic kidney disease (HCC) 06/13/2024   Dehydration 05/23/2024   S/P AKA (above knee amputation), left (HCC) 02/04/2024   Staphylococcal arthritis of right shoulder (HCC) 08/29/2023   Acute bursitis of right shoulder 08/24/2023   Iron  deficiency anemia, unspecified 08/23/2023   Overweight (BMI 25.0-29.9) 08/22/2023   Amputation stump infection (HCC) 08/20/2023   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) 06/01/2023   Chronic kidney disease, stage 3a (HCC) 06/01/2023   Tobacco abuse 06/01/2023   Cellulitis and abscess of left leg 04/30/2023   Cellulitis of left lower extremity 04/29/2023   Carpal tunnel syndrome 07/12/2022   Polyneuropathy 07/12/2022   Ulnar neuropathy 07/12/2022   Acute epigastric pain 07/12/2022   Gastro-esophageal reflux disease without esophagitis 07/12/2022   Nausea and vomiting 07/12/2022   Abscess  of right upper extremity 02/26/2022   Cellulitis of left lower extremity without foot    Hyponatremia 09/06/2021   PAD (peripheral artery disease) (HCC) 05/26/2021   Acute kidney injury superimposed on chronic kidney disease (HCC) 04/03/2021   Syncope 04/03/2021   Neck pain    Abnormal weight loss 12/09/2020   Change in bowel habit 12/09/2020   Constipation 12/09/2020   Epigastric pain 12/09/2020   Drug-induced constipation 12/09/2020   Periumbilical pain 12/09/2020   Fatty liver 12/09/2020   S/P BKA (below knee amputation), left (HCC) 11/20/2020   Necrotizing fasciitis of ankle and foot (HCC) 11/07/2020   Major depressive disorder, recurrent, in remission (HCC) 09/01/2020   Pain management contract signed 09/07/2019   Evaluation by psychiatric service required 08/18/2019   History of depression 08/18/2019   Primary osteoarthritis of both shoulders 07/16/2019   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) 07/16/2019   Chronic pain of both shoulders 07/16/2019   Cervical radicular pain 07/16/2019   S/P cervical spinal fusion 07/16/2019   Cervical spondylosis 07/16/2019   Cervical facet joint syndrome 07/16/2019   Chronic pain syndrome 07/16/2019   Anemia of chronic disease 05/25/2019   Osteomyelitis (HCC) 05/25/2019   Diabetic foot infection (HCC) 03/05/2019   Abscess 11/25/2018   Hyperlipidemia associated with type 2 diabetes mellitus (HCC) 06/16/2018   Intractable nausea and vomiting 10/21/2017   HNP (herniated nucleus pulposus), cervical 09/09/2017   Displacement of cervical intervertebral disc without myelopathy 08/27/2017   Erectile dysfunction 07/31/2017   Gastroesophageal reflux disease 07/31/2017   Rotator cuff syndrome of left shoulder 07/31/2017   Cellulitis of right leg 07/30/2016  Tobacco abuse disorder 07/30/2016   Routine general medical examination at a health care facility 01/20/2014   Diabetic neuropathy, type I diabetes mellitus (HCC) 01/20/2014    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   HLD (hyperlipidemia) 03/17/2009   Uncontrolled diabetes mellitus with hyperglycemia (HCC) 12/08/2008   OBSTRUCTIVE SLEEP APNEA 12/08/2008   Essential hypertension 12/08/2008   Allergic rhinitis 12/15/2007   Other malaise and fatigue 12/15/2007    Orientation RESPIRATION BLADDER Height & Weight     Self, Time, Situation, Place  Normal External catheter Weight: 197 lb 8.5 oz (89.6 kg) Height:  5' 10 (177.8 cm)  BEHAVIORAL SYMPTOMS/MOOD NEUROLOGICAL BOWEL NUTRITION STATUS      Continent Diet (carb modified)  AMBULATORY STATUS COMMUNICATION OF NEEDS Skin   Limited Assist Verbally Surgical wounds (scrotum - gauze, abd pads)                       Personal Care Assistance Level of Assistance  Bathing, Feeding, Dressing Bathing Assistance: Limited assistance Feeding assistance: Independent Dressing Assistance: Limited assistance     Functional Limitations Info             SPECIAL CARE FACTORS FREQUENCY  PT (By licensed PT), OT (By licensed OT)     PT Frequency: 5 times per week OT Frequency: 5 times per week            Contractures      Additional Factors Info  Code Status, Allergies Code Status Info: full Allergies Info: Oxycodone , Codeine           Current Medications (06/21/2024):  This is the current hospital active medication list Current Facility-Administered Medications  Medication Dose Route Frequency Provider Last Rate Last Admin   acetaminophen  (TYLENOL ) tablet 650 mg  650 mg Oral Q6H PRN Francisca Redell BROCKS, MD   650 mg at 06/17/24 2034   amLODipine  (NORVASC ) tablet 10 mg  10 mg Oral Daily Agbata, Tochukwu, MD   10 mg at 06/21/24 0851   ascorbic acid  (VITAMIN C ) tablet 500 mg  500 mg Oral Daily Francisca Redell BROCKS, MD   500 mg at 06/21/24 9147   atorvastatin  (LIPITOR) tablet 40 mg  40 mg Oral Daily Francisca Redell BROCKS, MD   40 mg at 06/21/24 9148   Chlorhexidine  Gluconate Cloth 2 % PADS 6 each  6 each Topical Daily Francisca Redell BROCKS, MD   6 each at 06/21/24 1003   cyanocobalamin  (VITAMIN B12) tablet 1,000 mcg  1,000 mcg Oral Daily Francisca Redell BROCKS, MD   1,000 mcg at 06/21/24 0851   enoxaparin  (LOVENOX ) injection 40 mg  40 mg Subcutaneous Q24H Agbata, Tochukwu, MD   40 mg at 06/20/24 1537   famotidine  (PEPCID ) tablet 10 mg  10 mg Oral BID Francisca Redell BROCKS, MD   10 mg at 06/21/24 0851   feeding supplement (GLUCERNA SHAKE) (GLUCERNA SHAKE) liquid 237 mL  237 mL Oral TID BM Francisca Redell BROCKS, MD   237 mL at 06/21/24 0850   fenofibrate  tablet 54 mg  54 mg Oral Daily Francisca Redell BROCKS, MD   54 mg at 06/21/24 9148   ferrous sulfate  tablet 325 mg  325 mg Oral Q breakfast Francisca Redell BROCKS, MD   325 mg at 06/21/24 9147   HYDROmorphone  (DILAUDID ) tablet 2 mg  2 mg Oral Q4H PRN Agbata, Tochukwu, MD   2 mg at 06/21/24 0915   hydrOXYzine  (ATARAX ) tablet 25 mg  25 mg Oral TID PRN Francisca Redell BROCKS, MD  25 mg at 06/18/24 2137   insulin  aspart (novoLOG ) injection 5 Units  5 Units Subcutaneous TID WC Agbata, Tochukwu, MD   5 Units at 06/21/24 0850   insulin  glargine-yfgn (SEMGLEE ) injection 40 Units  40 Units Subcutaneous QHS Agbata, Tochukwu, MD   40 Units at 06/20/24 2200   loperamide  HCl (IMODIUM ) 1 MG/7.5ML suspension 1 mg  1 mg Oral Daily PRN Duncan, Hazel V, MD   1 mg at 06/19/24 0438   ondansetron  (ZOFRAN ) injection 4 mg  4 mg Intravenous Q8H PRN Francisca Redell BROCKS, MD   4 mg at 06/20/24 2022   pantoprazole  (PROTONIX ) EC tablet 40 mg  40 mg Oral Daily Francisca Redell BROCKS, MD   40 mg at 06/21/24 9147   piperacillin -tazobactam (ZOSYN ) IVPB 3.375 g  3.375 g Intravenous Q8H Francisca Redell C, MD 12.5 mL/hr at 06/21/24 0504 3.375 g at 06/21/24 0504   pregabalin  (LYRICA ) capsule 75 mg  75 mg Oral BID Francisca Redell BROCKS, MD   75 mg at 06/21/24 9148   sertraline  (ZOLOFT ) tablet 100 mg  100 mg Oral Daily Francisca Redell C, MD   100 mg at 06/21/24 9148   sodium bicarbonate  tablet 650 mg  650 mg Oral BID Druscilla Bald, NP   650 mg at 06/21/24 9148    traZODone  (DESYREL ) tablet 100 mg  100 mg Oral QHS Francisca Redell BROCKS, MD   100 mg at 06/20/24 2023     Discharge Medications: Please see discharge summary for a list of discharge medications.  Relevant Imaging Results:  Relevant Lab Results:   Additional Information SS #: [id number]  Chaden Doom E Parris Signer, LCSW

## 2024-06-22 ENCOUNTER — Ambulatory Visit (INDEPENDENT_AMBULATORY_CARE_PROVIDER_SITE_OTHER): Payer: Self-pay | Admitting: Licensed Clinical Social Worker

## 2024-06-22 DIAGNOSIS — N492 Inflammatory disorders of scrotum: Secondary | ICD-10-CM | POA: Diagnosis not present

## 2024-06-22 DIAGNOSIS — B9689 Other specified bacterial agents as the cause of diseases classified elsewhere: Secondary | ICD-10-CM

## 2024-06-22 DIAGNOSIS — A429 Actinomycosis, unspecified: Secondary | ICD-10-CM

## 2024-06-22 DIAGNOSIS — Z91199 Patient's noncompliance with other medical treatment and regimen due to unspecified reason: Secondary | ICD-10-CM

## 2024-06-22 LAB — GLUCOSE, CAPILLARY
Glucose-Capillary: 243 mg/dL — ABNORMAL HIGH (ref 70–99)
Glucose-Capillary: 226 mg/dL — ABNORMAL HIGH (ref 70–99)
Glucose-Capillary: 267 mg/dL — ABNORMAL HIGH (ref 70–99)
Glucose-Capillary: 266 mg/dL — ABNORMAL HIGH (ref 70–99)

## 2024-06-22 LAB — AEROBIC/ANAEROBIC CULTURE W GRAM STAIN (SURGICAL/DEEP WOUND)

## 2024-06-22 MED ORDER — HYDROMORPHONE HCL 1 MG/ML IJ SOLN
1.0000 mg | Freq: Once | INTRAMUSCULAR | Status: AC
  Administered 2024-06-22: 1 mg via INTRAVENOUS
  Filled 2024-06-22: qty 1

## 2024-06-22 MED ORDER — SODIUM CHLORIDE 0.9 % IV SOLN
1.0000 g | Freq: Two times a day (BID) | INTRAVENOUS | Status: AC
  Administered 2024-06-22 – 2024-06-24 (×6): 1 g via INTRAVENOUS
  Filled 2024-06-22 (×6): qty 20

## 2024-06-22 NOTE — Plan of Care (Signed)
  Problem: Education: Goal: Ability to describe self-care measures that may prevent or decrease complications (Diabetes Survival Skills Education) will improve Outcome: Progressing   Problem: Coping: Goal: Ability to adjust to condition or change in health will improve Outcome: Progressing   Problem: Health Behavior/Discharge Planning: Goal: Ability to manage health-related needs will improve Outcome: Progressing   

## 2024-06-22 NOTE — Progress Notes (Signed)
 Date of Admission:  06/13/2024      ID: Richard Mendoza is a 48 y.o. 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)   Chronic diastolic CHF (congestive heart failure) (HCC)   Depression   Acute renal failure superimposed on stage 3b chronic kidney disease (HCC)   Cellulitis of perineum   Scrotal abscess    Subjective: Doing well   Medications:   amLODipine   10 mg Oral Daily   ascorbic acid   500 mg Oral Daily   atorvastatin   40 mg Oral Daily   Chlorhexidine  Gluconate Cloth  6 each Topical Daily   cyanocobalamin   1,000 mcg Oral Daily   enoxaparin  (LOVENOX ) injection  40 mg Subcutaneous Q24H   famotidine   10 mg Oral BID   feeding supplement (GLUCERNA SHAKE)  237 mL Oral TID BM   fenofibrate   54 mg Oral Daily   ferrous sulfate   325 mg Oral Q breakfast   hydrALAZINE   50 mg Oral Q8H   insulin  aspart  5 Units Subcutaneous TID WC   insulin  glargine-yfgn  40 Units Subcutaneous QHS   pantoprazole   40 mg Oral Daily   pregabalin   75 mg Oral BID   sertraline   100 mg Oral Daily   traZODone   100 mg Oral QHS    Objective: Vital signs in last 24 hours: Patient Vitals for the past 24 hrs:  BP Temp Temp src Pulse Resp SpO2 Weight  06/22/24 1211 (!) 148/80 98.3 F (36.8 C) Oral 76 18 99 % --  06/22/24 0837 (!) 141/70 98.2 F (36.8 C) -- 78 18 98 % --  06/22/24 0442 -- -- -- -- -- -- 89.5 kg  06/22/24 0336 (!) 139/54 98 F (36.7 C) -- 74 18 100 % --  06/21/24 2345 127/60 97.6 F (36.4 C) Oral (!) 57 18 99 % --  06/21/24 2038 (!) 173/75 98.2 F (36.8 C) -- 72 18 97 % --  06/21/24 1502 (!) 146/80 97.8 F (36.6 C) -- 74 20 98 % --     Lines and Device Date on insertion # of days DC  Midwife     ETT       PHYSICAL EXAM:  General: Alert, cooperative, no distress, appears stated age.  Lungs: Clear to auscultation bilaterally. No Wheezing or Rhonchi. No rales. Heart: Regular rate and rhythm, no murmur, rub or gallop. Abdomen: Soft, non-tender,not distended. Bowel sounds normal. No masses scrotum examined Swelling so much better   Extremities: left AKA Skin: No rashes or lesions. Or bruising Lymph: Cervical, supraclavicular normal. Neurologic: Grossly non-focal  Lab Results    Latest Ref Rng & Units 06/21/2024    6:33 AM 06/18/2024    9:51 AM 06/17/2024   10:28 AM  CBC  WBC 4.0 - 10.5 K/uL 9.5  11.3  11.7   Hemoglobin 13.0 - 17.0 g/dL 89.7  8.8  8.1   Hematocrit 39.0 - 52.0 % 31.1  27.8  24.9   Platelets 150 - 400 K/uL 366  302  259        Latest Ref Rng & Units 06/21/2024    6:33 AM 06/19/2024    4:20 AM 06/18/2024  9:02 AM  CMP  Glucose 70 - 99 mg/dL 858  802  662   BUN 6 - 20 mg/dL 22  34  35   Creatinine 0.61 - 1.24 mg/dL 7.87  7.25  7.04   Sodium 135 - 145 mmol/L 140  139  137   Potassium 3.5 - 5.1 mmol/L 3.8  4.1  4.3   Chloride 98 - 111 mmol/L 109  112  106   CO2 22 - 32 mmol/L 22  20  20    Calcium  8.9 - 10.3 mg/dL 8.9  8.5  9.1       Microbiology: BC- NG WC- klebsiella pneumoniae, anerobes Studies/Results: No results found.    Assessment/Plan: Scrotal abscess with surrounding cellulitis- s/p I/D Culture so far klebsiella pneumoniae, actinomyces, prevotella S/p I/D culture pending ( so far klebsiella and possible anerobes) Zosyn  changed to meropenem  as Kleb is ESBL On dishcarge will do IV ertapenem for a total of 2 weeks     AKI on CKD- improving Combination of infection, meds , ? Prerenal decrased intake Was on vanco + zosyn - vanco has been Dc and started linezolid  HE also got a dose of ibuprofen ,no IV contrast  Anemia   Renal US   Pt has foley   DM- last A1c from 7/7 is 9.2 ( was 13.6 in March 2025)    Left AKA   Anxiety /Depression- on sertraline  and trazadone- watch closely for any serotonin syndrome with linezolid   Discussed the management with the patient

## 2024-06-22 NOTE — Progress Notes (Addendum)
 Pro 7 gress Note   Patient: Richard Mendoza FMW:[id number] DOB: 30-Aug-1976 DOA: 06/13/2024     9 DOS: the patient was seen and examined on 06/22/2024   Brief hospital course:  [name] is a 48 y.o. male with medical history significant of status post left AKA, PVD on Eliquis , DM-type I on insulin  pump, hypertension, chronic pain syndrome, CKD-3a, HLD, tobacco abuse, who presents with scrotal pain and fever.  Pt state that 3 days ago he had some irritation in his perineal area that he thought may be related to an ingrown hair.  Ever since then, he has developed pain and swelling in the left scrotal area, as well as of his penile foreskin.  The pain is constant, aching, severe, nonradiating, not aggravated or alleviated by any known factors. He has difficulty retracting his foreskin with difficult urinating, but still able to void.  He developed fever and chills.  His temperature is 101.2 in the ED.  Patient does not have chest pain, cough, SOB.  No nausea, vomiting, diarrhea or abdominal pain.   Data reviewed independently and ED Course: pt was found to have WBC 13.7, worsening renal function, lactic acid 2.2 --> 2.0, temperature 101.2, blood pressure 174/114, heart rate 120-130, RR 16, oxygen saturation 100% on room air.  Patient is admitted to telemetry bed as inpatient.  ED physician discussed with Dr. Nieves of urology.   CT of abdomen/pelvis: 1. Diffuse scrotal wall thickening and inflammatory changes in the left perineum. No drainable fluid collection or abscess. No soft tissue gas. 2.  Aortic Atherosclerosis (ICD10-I70.0).   EKG: I have personally reviewed.  Sinus rhythm, QTc 482, bifascicular block   Assessment and Plan:  Sepsis due to cellulitis of scrotum:   Patient met criteria for sepsis on admission with WBC 13.7, temperature 101.2, heart rate up to 130s.  Lactic acid 2.2 -> 2.0.  ED patient discussed with Dr. Nieves of urology.  Recommended reconsultation as  needed Patient seen in consultation by Dr Francisca 07/28, agrees with continuing IV antibiotic therapy and recommends to hold patient's Eliquis  for possible procedure. Vancomycin  and Zosyn  were discontinued due to worsening renal function  Patient is currently on meropenem  (08/04), was on Linezolid  and Zosyn  07/29 - Repeat CT scan of the pelvis without contrast showed worsened scrotal swelling with findings concerning for developing abscess in the inferior left scrotum. No soft tissue gas. 07/30 - Appreciate urology input, patient is status post incision and drainage of scrotal abscess Surgical wound culture revealed ESBL Klebsiella species.  Continue local wound care.Urology performed initial dressing and recommends to continue wet-to-dry wound care twice daily Duration of antibiotic therapy to be determined by ID         Essential hypertension Lasix  and losartan are on hold due to worsening renal function Continue amlodipine  and hydralazine  to optimize blood pressure control       Acute kidney injury superimposed on stage IIIb chronic kidney disease Unclear etiology and may be medication induced versus postobstructive At baseline patient has a serum creatinine of 2.1>> 3.76 >> 4.55 >> 2.95 >> 2.74 >> 2.12 Continue to hold losartan and Lasix   Appreciate nephrology input Renal ultrasound is negative for hydronephrosis Renal function is improved but not back to baseline         Acute anemia superimposed on anemia of chronic kidney disease Iron  deficiency anemia Noted to have a hemoglobin of 7.9 from a baseline of 13 Most likely hemodilutional Serum iron  level is 8 Will need  referral to GI as an outpatient       Acute Urinary Retention Patient with complaints of difficulty voiding Bladder scan yielded > 300cc of urine Foley catheter has been discontinued Patient has been able to void     PAD (peripheral artery disease) (HCC) Continue Eliquis , Lipitor     Type 1 diabetes  mellitus with diabetic neuropathy (HCC):  ?? Diabetic Gastroparesis Recent A1c 9.2, poorly controlled.   Received systemic steroids resulting in worsening hyperglycemia Improved glycemic control. At risk for hypoglycemia Continue long-acting insulin  but decrease to 40 units daily Continue scheduled Novolog  Maintain consistent carbohydrate diet Trial of Reglan      Chronic diastolic CHF (congestive heart failure) (HCC):  2D Echo on 08/28/2023 showed EF of 55-60% with grade 1 diastolic dysfunction. CHF is compensated. Hold Lasix  and losartan due to AKI     HLD (hyperlipidemia) -Lipitor, fenofibrate        Chronic pain syndrome -Continue home oral Dilaudid , lyrica      Depression -Continue sertraline  and trazodone      Overweight (BMI 25.0-29.9): Body weight 92 kg, BMI 29.10 - Encourage losing weight - Exercise and healthy diet       Physical Deconditioning Secondary to prolonged hospitalization PT recommends SNF           Subjective: Nausea  has improved.  Requesting for dose of IV Dilaudid  prior to his wound packing.  Physical Exam: Vitals:   06/22/24 0336 06/22/24 0442 06/22/24 0837 06/22/24 1211  BP: (!) 139/54  (!) 141/70 (!) 148/80  Pulse: 74  78 76  Resp: 18  18 18   Temp: 98 F (36.7 C)  98.2 F (36.8 C) 98.3 F (36.8 C)  TempSrc:      SpO2: 100%  98% 99%  Weight:  89.5 kg    Height:       General: Not in acute distress HEENT:       Eyes: PERRL, EOMI, no jaundice       ENT: No discharge from the ears and nose, no pharynx injection, no tonsillar enlargement.        Neck: No JVD, no bruit, no mass felt. Heme: No neck lymph node enlargement. Cardiac: S1/S2, RRR, No murmurs, No gallops or rubs. Respiratory: No rales, wheezing, rhonchi or rubs. GI: Soft, nondistended, nontender, no rebound pain, no organomegaly, BS present. GU: Dressing over scrotal area Ext: No pitting leg edema bilaterally. S/p of left AKA Musculoskeletal: No joint deformities, No  joint redness or warmth, no limitation of ROM in spin. Skin: No rashes.  Neuro: Alert, oriented X3, cranial nerves II-XII grossly intact, moves all extremities normally. Psych: Patient is not psychotic, no suicidal or hemocidal ideation  Data Reviewed:  There are no new results to review at this time.  Family Communication: Plan of care discussed with patient at the bedside.  He verbalizes understanding and agrees with the plan.  Disposition: Status is: Inpatient Remains inpatient appropriate because: Awaiting SNF  Planned Discharge Destination: Skilled nursing facility    Time spent: 40 Minutes  Author: Aimee Somerset, MD 06/22/2024 12:39 PM  For on call review [email or web address].

## 2024-06-22 NOTE — Progress Notes (Signed)
 [facility]  ROUNDING NOTE   Subjective:  Admitted with scrotal abscess requiring I&d by urology. Foley catheter placed. Followed by ID. Placed on pip/tazo and linazolid.  Now on Linezolid  Creatinine trending down. Follows outpatient with Dr. Dennise with [facility] [place].  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   Currently holding home benazepril .  Update: No acute events overnight. On room air. Creatinine 2.12 no new labs today.   Objective:  Vital signs in last 24 hours:  Temp:  [97.6 F (36.4 C)-98.2 F (36.8 C)] 98.2 F (36.8 C) (08/04 0837) Pulse Rate:  [57-78] 78 (08/04 0837) Resp:  [18-20] 18 (08/04 0837) BP: (127-173)/(54-80) 141/70 (08/04 0837) SpO2:  [97 %-100 %] 98 % (08/04 0837) Weight:  [89.5 kg] 89.5 kg (08/04 0442)  Weight change: -0.1 kg Filed Weights   06/20/24 0500 06/21/24 0500 06/22/24 0442  Weight: 95.7 kg 89.6 kg 89.5 kg    Intake/Output: I/O last 3 completed shifts: In: 688.9 [P.O.:240; IV Piggyback:448.9] Out: 1400 [Urine:1400]   Intake/Output this shift:  Total I/O In: -  Out: 600 [Urine:600]  Physical Exam: General: NAD,   Head: Normocephalic  Eyes: Anicteric  Neck: Supple  Lungs:  Clear, room air  Heart: Regular rate   Abdomen:  Soft, nontender  Extremities: No peripheral edema.  Neurologic: Alert and oriented  Skin: No rashes  Access: None    Basic Metabolic Panel: Recent Labs  Lab 06/16/24 0914 06/17/24 0817 06/18/24 0902 06/19/24 0420 06/21/24 0633  NA 131* 139 137 139 140  K 4.6 4.1 4.3 4.1 3.8  CL 104 110 106 112* 109  CO2 17* 22 20* 20* 22  GLUCOSE 321* 141* 337* 197* 141*  BUN 45* 35* 35* 34* 22*  CREATININE 4.55* 3.34* 2.95* 2.74* 2.12*  CALCIUM  8.5* 8.9 9.1 8.5* 8.9  MG  --   --   --   --  2.0  PHOS 3.6 3.2  --   --  2.6    Liver Function Tests: Recent Labs  Lab 06/16/24 0912 06/16/24 0914 06/17/24 0817 06/21/24 0633  AST 14*  --   --   --   ALT 10  --   --   --    ALKPHOS 79  --   --   --   BILITOT 0.5  --   --   --   PROT 5.4*  --   --   --   ALBUMIN 2.0* 2.3* 2.3* 2.6*   No results for input(s): LIPASE, AMYLASE in the last 168 hours. No results for input(s): AMMONIA in the last 168 hours.  CBC: Recent Labs  Lab 06/16/24 0914 06/17/24 1028 06/18/24 0951 06/21/24 0633  WBC 13.4* 11.7* 11.3* 9.5  HGB 7.6* 8.1* 8.8* 10.2*  HCT 23.9* 24.9* 27.8* 31.1*  MCV 77.6* 77.1* 77.4* 76.6*  PLT 186 259 302 366    Cardiac Enzymes: No results for input(s): CKTOTAL, CKMB, CKMBINDEX, TROPONINI in the last 168 hours.  BNP: Invalid input(s): POCBNP  CBG: Recent Labs  Lab 06/21/24 0824 06/21/24 1239 06/21/24 1503 06/21/24 2008 06/22/24 0833  GLUCAP 121* 208* 232* 270* 243*    Microbiology: Results for orders placed or performed during the hospital encounter of 06/13/24  Blood Culture (routine x 2)     Status: None   Collection Time: 06/13/24  3:32 PM   Specimen: BLOOD RIGHT ARM  Result Value Ref Range Status   Specimen Description BLOOD RIGHT ARM  Final   Special Requests  Final    BOTTLES DRAWN AEROBIC AND ANAEROBIC Blood Culture results may not be optimal due to an inadequate volume of blood received in culture bottles   Culture   Final    NO GROWTH 5 DAYS Performed at [facility], [address] [place], [place] [address]    Report Status 06/18/2024 FINAL  Final  Blood Culture (routine x 2)     Status: None   Collection Time: 06/13/24  4:37 PM   Specimen: BLOOD  Result Value Ref Range Status   Specimen Description BLOOD BLOOD LEFT FOREARM  Final   Special Requests   Final    BOTTLES DRAWN AEROBIC AND ANAEROBIC Blood Culture results may not be optimal due to an inadequate volume of blood received in culture bottles   Culture   Final    NO GROWTH 5 DAYS Performed at [facility], [address] [place], [place] [address]    Report Status 06/18/2024 FINAL  Final  Aerobic/Anaerobic Culture w  Gram Stain (surgical/deep wound)     Status: None (Preliminary result)   Collection Time: 06/17/24 12:12 PM   Specimen: Wound; Tissue  Result Value Ref Range Status   Specimen Description TISSUE SCROTUM  Final   Special Requests  FROM SCROTAL ABSCESS  Final   Gram Stain   Final    NO WBC SEEN FEW GRAM POSITIVE COCCI FEW GRAM POSITIVE RODS RARE GRAM NEGATIVE RODS    Culture   Final    RARE KLEBSIELLA PNEUMONIAE Confirmed Extended Spectrum Beta-Lactamase Producer (ESBL).  In bloodstream infections from ESBL organisms, carbapenems are preferred over piperacillin /tazobactam. They are shown to have a lower risk of mortality. Two isolates with different morphologies were identified as the same organism.The most resistant organism was reported. FEW PREVOTELLA BIVIA BETA LACTAMASE POSITIVE FEW GRAM POSITIVE RODS CULTURE REINCUBATED FOR BETTER GROWTH Performed at [facility], [address] [place], [place] [address]    Report Status PENDING  Incomplete   Organism ID, Bacteria KLEBSIELLA PNEUMONIAE  Final      Susceptibility   Klebsiella pneumoniae - MIC*    AMPICILLIN  >=32 RESISTANT Resistant     CEFEPIME  <=0.12 SENSITIVE Sensitive     CEFTAZIDIME RESISTANT Resistant     CEFTRIAXONE  <=0.25 SENSITIVE Sensitive     CIPROFLOXACIN  <=0.25 SENSITIVE Sensitive     GENTAMICIN <=1 SENSITIVE Sensitive     IMIPENEM 1 SENSITIVE Sensitive     TRIMETH /SULFA  <=20 SENSITIVE Sensitive     AMPICILLIN /SULBACTAM >=32 RESISTANT Resistant     PIP/TAZO <=4 SENSITIVE Sensitive ug/mL    * RARE KLEBSIELLA PNEUMONIAE  Aerobic/Anaerobic Culture w Gram Stain (surgical/deep wound)     Status: None (Preliminary result)   Collection Time: 06/17/24 12:13 PM   Specimen: Wound; Abscess  Result Value Ref Range Status   Specimen Description   Final    WOUND Performed at [facility], [address] [place], [place] [address]    Special Requests   Final     SCROTAL ABSCESS Performed at [facility], [address] [place], [place] [address]    Gram Stain   Final    ABUNDANT WBC PRESENT, PREDOMINANTLY PMN MODERATE GRAM POSITIVE COCCI FEW GRAM NEGATIVE RODS RARE GRAM POSITIVE RODS    Culture   Final    RARE KLEBSIELLA PNEUMONIAE MODERATE PREVOTELLA BIVIA BETA LACTAMASE POSITIVE MODERATE GRAM POSITIVE RODS CULTURE REINCUBATED FOR BETTER GROWTH Performed at [facility], [address] [place], [place] [address]  Report Status PENDING  Incomplete   Organism ID, Bacteria KLEBSIELLA PNEUMONIAE  Final      Susceptibility   Klebsiella pneumoniae - MIC*    AMPICILLIN  >=32 RESISTANT Resistant     CEFEPIME  <=0.12 SENSITIVE Sensitive     CEFTAZIDIME <=1 SENSITIVE Sensitive     CEFTRIAXONE  <=0.25 SENSITIVE Sensitive     CIPROFLOXACIN  <=0.25 SENSITIVE Sensitive     GENTAMICIN <=1 SENSITIVE Sensitive     IMIPENEM <=0.25 SENSITIVE Sensitive     TRIMETH /SULFA  <=20 SENSITIVE Sensitive     AMPICILLIN /SULBACTAM 4 SENSITIVE Sensitive     PIP/TAZO <=4 SENSITIVE Sensitive ug/mL    * RARE KLEBSIELLA PNEUMONIAE    Coagulation Studies: No results for input(s): LABPROT, INR in the last 72 hours.  Urinalysis: No results for input(s): COLORURINE, LABSPEC, PHURINE, GLUCOSEU, HGBUR, BILIRUBINUR, KETONESUR, PROTEINUR, UROBILINOGEN, NITRITE, LEUKOCYTESUR in the last 72 hours.  Invalid input(s): APPERANCEUR    Imaging: No results found.   Medications:    meropenem  (MERREM ) IV      amLODipine   10 mg Oral Daily   ascorbic acid   500 mg Oral Daily   atorvastatin   40 mg Oral Daily   Chlorhexidine  Gluconate Cloth  6 each Topical Daily   cyanocobalamin   1,000 mcg Oral Daily   enoxaparin  (LOVENOX ) injection  40 mg Subcutaneous Q24H   famotidine   10 mg Oral BID   feeding supplement (GLUCERNA SHAKE)  237 mL Oral TID BM   fenofibrate   54 mg Oral Daily   ferrous sulfate   325 mg Oral Q breakfast   hydrALAZINE   50 mg Oral Q8H   insulin  aspart  5  Units Subcutaneous TID WC   insulin  glargine-yfgn  40 Units Subcutaneous QHS   pantoprazole   40 mg Oral Daily   pregabalin   75 mg Oral BID   sertraline   100 mg Oral Daily   traZODone   100 mg Oral QHS   acetaminophen , HYDROmorphone , hydrOXYzine , loperamide  HCl, ondansetron  (ZOFRAN ) IV  Assessment/ Plan:  [name] is a 48 y.o.  male with past medical conditions including PVD on Eliquis , type 1 diabetes with insulin  pump, hypertension, hyperlipidemia, left AKA, CKD stage III B,, who was admitted to [facility] on 06/13/2024 for Cellulitis of perineum [L03.315] Cellulitis of scrotum [N49.2]     Acute kidney injury on chronic kidney disease stage IIIb.  Baseline creatinine appears to be 2.09 with GFR 39 on 02/05/2024.  Acute kidney injury likely secondary to infection.  Creatinine 3.16 on admission.  CT pelvis negative for obstruction.  No IV contrast exposure.  No acute indication for dialysis.  Renal function continues to improve.  Continue to encouraged oral intake. Continuing to monitor renal indices daily. Lab Results  Component Value Date   CREATININE 2.12 (H) 06/21/2024   CREATININE 2.74 (H) 06/19/2024   CREATININE 2.95 (H) 06/18/2024     Intake/Output Summary (Last 24 hours) at 06/22/2024 1017 Last data filed at 06/22/2024 0838 Gross per 24 hour  Intake 17.14 ml  Output 800 ml  Net -782.86 ml      2. Anemia of chronic kidney disease Hemoglobin improved No indication for ESA at present.   3. Acute metabolic acidosis, likely secondary to kidney injury.  S bicarb 22. Resolved. Sodium bicarbonate  discontinued.   4.  Insulin -dependent diabetes with chronic kidney disease/renal manifestations:  Home regimen includes NovoLog  and Lantus . Most recent hemoglobin A1c is 9.2 on 05/25/2024.              Glucose elevated at times  5.  Sepsis due to cellulitis of scrotum.  Currently on meropenem .  Dose adjusted by pharmacy.   LOS: 9 Darcy Barbara 8/4/202510:17 AM

## 2024-06-22 NOTE — Inpatient Diabetes Management (Signed)
 Inpatient Diabetes Program Recommendations  AACE/ADA: New Consensus Statement on Inpatient Glycemic Control   Target Ranges:  Prepandial:   less than 140 mg/dL      Peak postprandial:   less than 180 mg/dL (1-2 hours)      Critically ill patients:  140 - 180 mg/dL    Latest Reference Range & Units 06/21/24 08:24 06/21/24 12:39 06/21/24 15:03 06/21/24 20:08 06/22/24 08:33  Glucose-Capillary 70 - 99 mg/dL 878 (H) 791 (H) 767 (H) 270 (H) 243 (H)   Review of Glycemic Control  Diabetes history: DM2 Outpatient Diabetes medications: Lantus  60 units daily, Humalog 10-16  units TID with meals Current orders for Inpatient glycemic control: Semglee  40 units at bedtime, Novolog  5 units TID with meals  Inpatient Diabetes Program Recommendations:    Insulin : Please consider increasing meal coverage insulin  to 8 units TID with meals and adding Novolog  0-9 units TID with meals and Novolog  0-5 units at bedtime for correction.   NOTE: Patient admitted 06/13/24 with sepsis due to cellulitis of scrotum. Per chart review, patient seen MICAEL Rio, NP at [facility] on 06/12/24 and was advised to increase Lantus  to 60 units at bedtime, continue Novolog  10-16 units TID with meals. CBG ranged from 121-232 mg/dl on 8/3 and CBG 756 mg/dl this morning. Recommend to increase meal coverage insulin  and add Novolog  correction scale.   Thanks, Earnie Gainer, RN, MSN, CDCES Diabetes Coordinator Inpatient Diabetes Program [phone] (Team Pager from 8am to 5pm)

## 2024-06-22 NOTE — Progress Notes (Signed)
 Physical Therapy Treatment Patient Details Name: Richard Mendoza MRN: [id number] DOB: 06/18/1976 Today's Date: 06/22/2024   History of Present Illness Pt is a 48 yo Male presenting to hospital with scrotum abscess, s/p I&D on 06/17/24. PMH significant for Left AKA 01/29/24 (s/p BKA in the past), DM on insulin  pump, HTN, ACDF 2018, L RCR, R shoulder arthroscopy with debridement and biceps tendon repair;    PT Comments  Pt tolerated treatment well today; treatment with emphasis on functional strength training and standing balance. Pt able to demonstrate improvements in assist levels, ambulation, and activity tolerance this date. He is CGA for transfers and gait in RW. Increased ankle righting reaction strategies and multidirectional sway noted with SLS balance interventions with limited/no UE support. Increased rest breaks required throughout session secondary to increased burning and fatigue of RLE and bil shoulders which is exacerbated by standing interventions.  Despite progress, he continues to be limited with meeting goals secondary to increased pain levels, decreased standing balance, and decreased activity tolerance. Pt will continue to benefit from skilled acute PT services to address deficits for return to baseline function. Will continue per POC.    If plan is discharge home, recommend the following: A little help with walking and/or transfers;A little help with bathing/dressing/bathroom;Assistance with cooking/housework;Assist for transportation;Help with stairs or ramp for entrance   Can travel by private vehicle     Yes  Equipment Recommendations       Recommendations for Other Services       Precautions / Restrictions Precautions Precautions: None Restrictions Weight Bearing Restrictions Per Provider Order: No     Mobility  Bed Mobility               General bed mobility comments: pt seated in recliner upon entry    Transfers                    General transfer comment: CGA for safety to perform multiple STS from recliner with RW, demonstrates good safety awareness with proper hand placement without cueing    Ambulation/Gait   Gait Distance (Feet): 90 Feet (60ft x3 with standing rest break) Assistive device: Rolling walker (2 wheels)         General Gait Details: CGA for safety to ambulate multiple short bouts in room with RW. Demonstrates fast cadence, large hop step length to front of RW, and increased instability with turns. Verbal cues provided for smaller hop step and slower cadence for reduced risk of falls. He is receptive to education. Required standing rest break between bouts secondary to c/o RLE and bil shoulder burning pain/fatigue.     Balance Overall balance assessment: Needs assistance Sitting-balance support: Feet supported Sitting balance-Leahy Scale: Good     Standing balance support: Reliant on assistive device for balance, During functional activity, Bilateral upper extremity supported Standing balance-Leahy Scale: Fair Standing balance comment: performs 3x20s of static balance without UE support, cues for forward gaze (requires gaze to target for maintenance of balance); 2x1' of static balance with 1 finger support bil and horizontal head turns. Increased ankle righting reactions noted with balance interventions with multidirectional sway requiring CGA for safety. Requires seated rest break between due to RLE fatigue.                            Communication Communication Communication: No apparent difficulties  Cognition Arousal: Alert Behavior During Therapy: WFL for tasks assessed/performed   PT - Cognitive  impairments: No apparent impairments                         Following commands: Intact      Cueing Cueing Techniques: Verbal cues  Exercises Other Exercises Other Exercises: Pt educated re: PT role/POC, DC recommendations, safety with functional mobility, importance  of balance interventions when using prosthesis, continued amb to/from bathroom for toileting, OOB to recliner as much as possible, appropriate positioning of L residual limb to prevent contractures for optimal use of prosthesis, LLE strengthening. He verbalized understanding.        Pertinent Vitals/Pain Pain Assessment Pain Assessment: 0-10 Pain Score: 7  Pain Location: scrotum Pain Descriptors / Indicators: Aching, Sore Pain Intervention(s): Limited activity within patient's tolerance, Monitored during session, Repositioned     PT Goals (current goals can now be found in the care plan section) Acute Rehab PT Goals Patient Stated Goal: to get stronger PT Goal Formulation: With patient Time For Goal Achievement: 07/03/24 Potential to Achieve Goals: Good Progress towards PT goals: Progressing toward goals    Frequency    Min 2X/week       AM-PAC PT 6 Clicks Mobility   Outcome Measure  Help needed turning from your back to your side while in a flat bed without using bedrails?: None Help needed moving from lying on your back to sitting on the side of a flat bed without using bedrails?: None Help needed moving to and from a bed to a chair (including a wheelchair)?: A Little Help needed standing up from a chair using your arms (e.g., wheelchair or bedside chair)?: A Little Help needed to walk in hospital room?: A Little Help needed climbing 3-5 steps with a railing? : A Lot 6 Click Score: 19    End of Session   Activity Tolerance: Patient tolerated treatment well;No increased pain Patient left: in chair;with call bell/phone within reach Nurse Communication: Mobility status PT Visit Diagnosis: Other abnormalities of gait and mobility (R26.89);Muscle weakness (generalized) (M62.81);Pain Pain - part of body:  (bil shoulder and scrotum)     Time: 8649-8586 PT Time Calculation (min) (ACUTE ONLY): 23 min  Charges:    $Therapeutic Activity: 8-22 mins $Neuromuscular  Re-education: 8-22 mins PT General Charges $$ ACUTE PT VISIT: 1 Visit                       Camie CHARLENA Kluver, PT, DPT 2:35 PM,06/22/24 Physical Therapist - [facility]

## 2024-06-22 NOTE — Plan of Care (Signed)
   Problem: Metabolic: Goal: Ability to maintain appropriate glucose levels will improve Outcome: Progressing

## 2024-06-22 NOTE — Progress Notes (Signed)
 Patient did not show for appointment.

## 2024-06-22 NOTE — TOC Progression Note (Addendum)
 Transition of Care [facility]) - Progression Note    Patient Details  Name: Richard Mendoza MRN: [id number] Date of Birth: 1975/12/14  [facility]) CM/SW Contact  Lauraine [name], LCSW Phone Number: 06/22/2024, 9:52 AM  Clinical Narrative:  No bed offers this morning. Sent therapy notes to facilities that had not responded yet and extended search to other facilities.   3:56 pm: Uploaded requested clinicals into [facility] for PASARR review.  Expected Discharge Plan: Skilled Nursing Facility Barriers to Discharge: Continued Medical Work up               Expected Discharge Plan and Services       Living arrangements for the past 2 months: Single Family Home                                       Social Drivers of Health (SDOH) Interventions SDOH Screenings   Food Insecurity: No Food Insecurity (06/14/2024)  Housing: High Risk (06/14/2024)  Transportation Needs: Unmet Transportation Needs (06/14/2024)  Utilities: Not At Risk (06/14/2024)  Depression (PHQ2-9): High Risk (05/19/2024)  Financial Resource Strain: Low Risk  (07/24/2022)   Received from [facility]  Social Connections: Socially Isolated (06/14/2024)  Stress: No Stress Concern Present (07/24/2022)   Received from [facility]  Tobacco Use: Medium Risk (06/13/2024)    Readmission Risk Interventions    01/24/2024   12:35 PM 08/21/2023    9:03 AM  Readmission Risk Prevention Plan  Transportation Screening Complete Complete  PCP or Specialist Appt within 3-5 Days Complete Complete  HRI or Home Care Consult Complete   Social Work Consult for Recovery Care Planning/Counseling Complete Complete  Palliative Care Screening Not Applicable Not Applicable  Medication Review Oceanographer) Complete Complete

## 2024-06-22 NOTE — TOC PASRR Note (Signed)
 RE: Richard Mendoza Date of Birth: 1976-10-31 Date: 06/22/2024   To Whom It May Concern:  Please be advised that the above-named patient will require a short-term nursing home stay - anticipated 30 days or less for rehabilitation and strengthening.  The plan is for return home.

## 2024-06-23 DIAGNOSIS — A419 Sepsis, unspecified organism: Principal | ICD-10-CM

## 2024-06-23 DIAGNOSIS — I10 Essential (primary) hypertension: Secondary | ICD-10-CM

## 2024-06-23 DIAGNOSIS — N492 Inflammatory disorders of scrotum: Secondary | ICD-10-CM | POA: Diagnosis not present

## 2024-06-23 DIAGNOSIS — Z1612 Extended spectrum beta lactamase (ESBL) resistance: Secondary | ICD-10-CM

## 2024-06-23 DIAGNOSIS — N179 Acute kidney failure, unspecified: Secondary | ICD-10-CM

## 2024-06-23 DIAGNOSIS — N1832 Chronic kidney disease, stage 3b: Secondary | ICD-10-CM

## 2024-06-23 DIAGNOSIS — D509 Iron deficiency anemia, unspecified: Secondary | ICD-10-CM

## 2024-06-23 DIAGNOSIS — I739 Peripheral vascular disease, unspecified: Secondary | ICD-10-CM | POA: Diagnosis not present

## 2024-06-23 DIAGNOSIS — A498 Other bacterial infections of unspecified site: Secondary | ICD-10-CM

## 2024-06-23 DIAGNOSIS — I5032 Chronic diastolic (congestive) heart failure: Secondary | ICD-10-CM

## 2024-06-23 LAB — GLUCOSE, CAPILLARY
Glucose-Capillary: 203 mg/dL — ABNORMAL HIGH (ref 70–99)
Glucose-Capillary: 203 mg/dL — ABNORMAL HIGH (ref 70–99)
Glucose-Capillary: 151 mg/dL — ABNORMAL HIGH (ref 70–99)
Glucose-Capillary: 184 mg/dL — ABNORMAL HIGH (ref 70–99)

## 2024-06-23 LAB — AEROBIC/ANAEROBIC CULTURE W GRAM STAIN (SURGICAL/DEEP WOUND): Gram Stain: NONE SEEN

## 2024-06-23 LAB — RENAL FUNCTION PANEL
Albumin: 2.4 g/dL — ABNORMAL LOW (ref 3.5–5.0)
Anion gap: 7 (ref 5–15)
BUN: 33 mg/dL — ABNORMAL HIGH (ref 6–20)
CO2: 22 mmol/L (ref 22–32)
Calcium: 8.6 mg/dL — ABNORMAL LOW (ref 8.9–10.3)
Chloride: 110 mmol/L (ref 98–111)
Creatinine, Ser: 2.66 mg/dL — ABNORMAL HIGH (ref 0.61–1.24)
GFR, Estimated: 29 mL/min — ABNORMAL LOW (ref >60)
Glucose, Bld: 250 mg/dL — ABNORMAL HIGH (ref 70–99)
Phosphorus: 3.1 mg/dL (ref 2.5–4.6)
Potassium: 3.6 mmol/L (ref 3.5–5.1)
Sodium: 139 mmol/L (ref 135–145)

## 2024-06-23 MED ORDER — HYDROMORPHONE HCL 1 MG/ML IJ SOLN
1.0000 mg | Freq: Two times a day (BID) | INTRAMUSCULAR | Status: DC | PRN
  Administered 2024-06-23 – 2024-06-25 (×4): 1 mg via INTRAVENOUS
  Filled 2024-06-23 (×4): qty 1

## 2024-06-23 MED ORDER — INSULIN ASPART 100 UNIT/ML IJ SOLN
8.0000 [IU] | Freq: Three times a day (TID) | INTRAMUSCULAR | Status: DC
  Administered 2024-06-23 – 2024-06-25 (×6): 8 [IU] via SUBCUTANEOUS
  Filled 2024-06-23 (×6): qty 1

## 2024-06-23 MED ORDER — HYDROMORPHONE HCL 1 MG/ML IJ SOLN
1.0000 mg | Freq: Every day | INTRAMUSCULAR | Status: DC | PRN
  Administered 2024-06-23: 1 mg via INTRAVENOUS
  Filled 2024-06-23: qty 1

## 2024-06-23 MED ORDER — ORAL CARE MOUTH RINSE: 15.0000 mL | OROMUCOSAL | Status: DC | PRN

## 2024-06-23 NOTE — Progress Notes (Addendum)
 Progress Note   Patient: Richard Mendoza FMW:[id number] DOB: [birth year] DOA: 06/13/2024  DOS: the patient was seen and examined on 06/23/2024   Brief hospital course:  48 y.o. male with medical history significant of status post left AKA, PVD on Eliquis , DM-type I on insulin  pump, hypertension, chronic pain syndrome, CKD-3a, HLD, tobacco abuse, who presents with scrotal pain and fever.   Assessment and Plan:  Sepsis - Fever, leukocytosis, tachycardia, lactic acidosis, scrotal abscess, given concern for sepsis.  Blood cultures, IV fluid bolus, empiric antibiotics on board.  Showing improvement.  Colitis of scrotum with abscess - Repeat CT scan 7/29 showed developing abscess.  S/p incision and drainage 7/30 by urology.  Wound culture revealed ESBL Klebsiella.  Broad-spectrum antibiotic coverage narrowed to meropenem  8/4.  Infectious disease on board.  Planning for discharge on IV ertapenem for total of 2 weeks.  Continue wet-to-dry dressing changes.  IV pain control improved as needed.  AKI on CKD 3B - Etiology unclear, likely exacerbated by infection and nephrotoxic agents.  Creatinine max 4.55.  Now downtrending to 2.66.  Holding nephrotoxic agents and diuretics.  Will recheck BMP in AM.  Hypertension - Lasix  and losartan on hold secondary to AKI.  Continue amlodipine , hydralazine .  Anemia on anemia of chronic disease - Iron  studies showing iron  deficiency anemia with low TIBC suggestive of anemia of chronic disease.  Likely exacerbated by infection procedure.  Holding off on p.o. iron  supplementation until infection resolved.  Will DC with p.o. iron  supplementation.  Acute urinary retention - Initial difficulty given cellulitis and abscess.  Foley catheter since removed.  Able to void.  Resolved.  Uncontrolled type 1 diabetes mellitus - A1c 9.2 suggesting poor control.  Continues on insulin  glargine 40 units daily with meal coverage and insulin  sliding scale.  Chronic HFpEF -  Does not appear to be in acute exacerbation.  Peripheral artery disease - Continue Eliquis , Lipitor.  Chronic pain syndrome - Continue home oral Dilaudid .  Lyrica .  Depression - Sertraline  and trazodone  on board.  Physical debilitation muscle weakness - Evaluated by PT.  Recommending SNF.   Subjective: Patient resting comfortably this morning.  Still has quite a bit of pain with dressing changes but otherwise feels improved.  States he has less redness and swelling and is motivated to be transition to short-term rehab.  Denies any fever, chills, chest pain, nausea, vomiting, abdominal pain.  Physical Exam:  Vitals:   06/23/24 0354 06/23/24 0500 06/23/24 0852 06/23/24 0913  BP: (!) 98/43  135/72 135/72  Pulse: 65  65   Resp: 19  20   Temp: 97.9 F (36.6 C)  97.8 F (36.6 C)   TempSrc:   Oral   SpO2: 100%  98%   Weight:  88.7 kg    Height:        GENERAL:  Alert, pleasant, no acute distress  HEENT:  EOMI CARDIOVASCULAR:  RRR, no murmurs appreciated RESPIRATORY:  Clear to auscultation, no wheezing, rales, or rhonchi GASTROINTESTINAL:  Soft, nontender, nondistended EXTREMITIES: Left AKA NEURO:  No new focal deficits appreciated SKIN:  No rashes noted PSYCH:  Appropriate mood and affect     Data Reviewed:  Imaging Studies: US  RENAL Result Date: 06/16/2024 CLINICAL DATA:  Acute kidney injury EXAM: RENAL / URINARY TRACT ULTRASOUND COMPLETE COMPARISON:  None Available. FINDINGS: Right Kidney: Renal measurements: 12.6 x 5.9 x 6.8 cm = volume: 264 mL. Echogenicity within normal limits. No mass or hydronephrosis visualized. Left Kidney: Renal measurements: 12.9 x 6.8 x  5.9 cm = volume: 271 mL. Echogenicity within normal limits. There is a simple cyst in the upper pole measuring 5 x 4 x 6 mm. No mass or hydronephrosis visualized. Bladder: Decompressed by Foley catheter. Other: None. IMPRESSION: 1. No hydronephrosis. 2. Small simple cyst in the upper pole of the left kidney.  Electronically Signed   By: Greig Pique M.D.   On: 06/16/2024 16:57   CT PELVIS WO CONTRAST Result Date: 06/16/2024 CLINICAL DATA:  Scrotal swelling and edema. EXAM: CT PELVIS WITHOUT CONTRAST TECHNIQUE: Multidetector CT imaging of the pelvis was performed following the standard protocol without intravenous contrast. RADIATION DOSE REDUCTION: This exam was performed according to the departmental dose-optimization program which includes automated exposure control, adjustment of the mA and/or kV according to patient size and/or use of iterative reconstruction technique. COMPARISON:  CT dated 06/13/2024. FINDINGS: Evaluation of this exam is limited in the absence of intravenous contrast. Urinary Tract: The urinary bladder is decompressed around a Foley catheter. Bowel:  No dilated bowel loops noted in the pelvis. Vascular/Lymphatic: Mild aortoiliac atherosclerotic disease. Mildly enlarged left external iliac chain lymph node measures 11 mm short axis. Reproductive: The prostate gland is grossly unremarkable. There is diffuse scrotal and perineal swelling and edema, progressed since the prior CT. Faint 3.7 x 1.6 cm ovoid area in the inferior left scrotal wall (64/2) may represent a developing abscess. Ultrasound may provide better evaluation if the patient is able to tolerate. No soft tissue gas. Other:  None Musculoskeletal: Degenerative changes.  No acute osseous pathology. IMPRESSION: 1. Worsened scrotal swelling with findings concerning for developing abscess in the inferior left scrotum. No soft tissue gas. 2.  Aortic Atherosclerosis (ICD10-I70.0). Electronically Signed   By: Vanetta Chou M.D.   On: 06/16/2024 13:11   CT PELVIS WO CONTRAST Result Date: 06/13/2024 CLINICAL DATA:  Soft tissue infection left posterior scrotum or perineum. EXAM: CT PELVIS WITHOUT CONTRAST TECHNIQUE: Multidetector CT imaging of the pelvis was performed following the standard protocol without intravenous contrast. RADIATION  DOSE REDUCTION: This exam was performed according to the departmental dose-optimization program which includes automated exposure control, adjustment of the mA and/or kV according to patient size and/or use of iterative reconstruction technique. COMPARISON:  CT abdomen pelvis dated 05/23/2024. FINDINGS: Evaluation of this exam is limited in the absence of intravenous contrast. Urinary Tract:  No abnormality visualized. Bowel:  Unremarkable visualized pelvic bowel loops. Vascular/Lymphatic: Mild aortoiliac atherosclerotic disease. The IVC is unremarkable. Reproductive: The prostate and seminal vesicles are grossly unremarkable. Other: There is diffuse scrotal wall thickening. There is inflammatory changes and thickening of the subcutaneous fat in the left perineum and along the left perineal fold. No drainable fluid collection or abscess. No soft tissue gas. Musculoskeletal: No suspicious bone lesions identified. IMPRESSION: 1. Diffuse scrotal wall thickening and inflammatory changes in the left perineum. No drainable fluid collection or abscess. No soft tissue gas. 2.  Aortic Atherosclerosis (ICD10-I70.0). Electronically Signed   By: Vanetta Chou M.D.   On: 06/13/2024 17:08    There are no new results to review at this time.  Previous records (including but not limited to H&P, progress notes, nursing notes, TOC management) were reviewed in assessment of this patient.  Labs: CBC: Recent Labs  Lab 06/17/24 1028 06/18/24 0951 06/21/24 0633  WBC 11.7* 11.3* 9.5  HGB 8.1* 8.8* 10.2*  HCT 24.9* 27.8* 31.1*  MCV 77.1* 77.4* 76.6*  PLT 259 302 366   Basic Metabolic Panel: Recent Labs  Lab 06/17/24 0817 06/18/24  9097 06/19/24 0420 06/21/24 0633 06/23/24 0454  NA 139 137 139 140 139  K 4.1 4.3 4.1 3.8 3.6  CL 110 106 112* 109 110  CO2 22 20* 20* 22 22  GLUCOSE 141* 337* 197* 141* 250*  BUN 35* 35* 34* 22* 33*  CREATININE 3.34* 2.95* 2.74* 2.12* 2.66*  CALCIUM  8.9 9.1 8.5* 8.9 8.6*  MG   --   --   --  2.0  --   PHOS 3.2  --   --  2.6 3.1   Liver Function Tests: Recent Labs  Lab 06/17/24 0817 06/21/24 0633 06/23/24 0454  ALBUMIN 2.3* 2.6* 2.4*   CBG: Recent Labs  Lab 06/22/24 0833 06/22/24 1208 06/22/24 1558 06/22/24 2028 06/23/24 0853  GLUCAP 243* 226* 267* 266* 203*    Scheduled Meds:  amLODipine   10 mg Oral Daily   ascorbic acid   500 mg Oral Daily   atorvastatin   40 mg Oral Daily   Chlorhexidine  Gluconate Cloth  6 each Topical Daily   cyanocobalamin   1,000 mcg Oral Daily   enoxaparin  (LOVENOX ) injection  40 mg Subcutaneous Q24H   famotidine   10 mg Oral BID   feeding supplement (GLUCERNA SHAKE)  237 mL Oral TID BM   fenofibrate   54 mg Oral Daily   ferrous sulfate   325 mg Oral Q breakfast   hydrALAZINE   50 mg Oral Q8H   insulin  aspart  5 Units Subcutaneous TID WC   insulin  glargine-yfgn  40 Units Subcutaneous QHS   pantoprazole   40 mg Oral Daily   pregabalin   75 mg Oral BID   sertraline   100 mg Oral Daily   traZODone   100 mg Oral QHS   Continuous Infusions:  meropenem  (MERREM ) IV 1 g (06/23/24 0922)   PRN Meds:.acetaminophen , HYDROmorphone  (DILAUDID ) injection, HYDROmorphone , hydrOXYzine , loperamide  HCl, ondansetron  (ZOFRAN ) IV, mouth rinse  Family Communication: Met at bedside  Disposition: Status is: Inpatient Remains inpatient appropriate because: ESBL Klebsiella status     Time spent: 35 minutes  Length of inpatient stay: 10 days  Author: Carliss LELON Canales, DO 06/23/2024 11:03 AM  For on call review [email or web address].

## 2024-06-23 NOTE — Progress Notes (Signed)
 [facility]  ROUNDING NOTE   Subjective:  Admitted with scrotal abscess requiring I&d by urology. Foley catheter placed. Followed by ID. Placed on pip/tazo and linazolid.  Now on Linezolid   Patient seen sitting up in bed Appears well, room air Appetite appropriate Denies pain or discomfort  Creatinine 2.66 Urine output 2075 mL  Objective:  Vital signs in last 24 hours:  Temp:  [97.8 F (36.6 C)-98.2 F (36.8 C)] 98 F (36.7 C) (08/05 1223) Pulse Rate:  [65-83] 77 (08/05 1223) Resp:  [18-22] 18 (08/05 1223) BP: (98-149)/(43-83) 147/83 (08/05 1500) SpO2:  [97 %-100 %] 98 % (08/05 1223) Weight:  [88.7 kg] 88.7 kg (08/05 0500)  Weight change: -0.8 kg Filed Weights   06/21/24 0500 06/22/24 0442 06/23/24 0500  Weight: 89.6 kg 89.5 kg 88.7 kg    Intake/Output: I/O last 3 completed shifts: In: 440 [P.O.:240; IV Piggyback:200] Out: 2275 [Urine:2275]   Intake/Output this shift:  Total I/O In: 240 [P.O.:240] Out: 200 [Urine:200]  Physical Exam: General: NAD  Head: Normocephalic  Eyes: Anicteric  Lungs:  Clear, room air  Heart: Regular rate   Abdomen:  Soft, nontender  Extremities: No peripheral edema.  Neurologic: Alert and oriented  Skin: No rashes, scrotal dressing  Access: None    Basic Metabolic Panel: Recent Labs  Lab 06/17/24 0817 06/18/24 0902 06/19/24 0420 06/21/24 0633 06/23/24 0454  NA 139 137 139 140 139  K 4.1 4.3 4.1 3.8 3.6  CL 110 106 112* 109 110  CO2 22 20* 20* 22 22  GLUCOSE 141* 337* 197* 141* 250*  BUN 35* 35* 34* 22* 33*  CREATININE 3.34* 2.95* 2.74* 2.12* 2.66*  CALCIUM  8.9 9.1 8.5* 8.9 8.6*  MG  --   --   --  2.0  --   PHOS 3.2  --   --  2.6 3.1    Liver Function Tests: Recent Labs  Lab 06/17/24 0817 06/21/24 0633 06/23/24 0454  ALBUMIN 2.3* 2.6* 2.4*   No results for input(s): LIPASE, AMYLASE in the last 168 hours. No results for input(s): AMMONIA in the last 168 hours.  CBC: Recent Labs  Lab  06/17/24 1028 06/18/24 0951 06/21/24 0633  WBC 11.7* 11.3* 9.5  HGB 8.1* 8.8* 10.2*  HCT 24.9* 27.8* 31.1*  MCV 77.1* 77.4* 76.6*  PLT 259 302 366    Cardiac Enzymes: No results for input(s): CKTOTAL, CKMB, CKMBINDEX, TROPONINI in the last 168 hours.  BNP: Invalid input(s): POCBNP  CBG: Recent Labs  Lab 06/22/24 1208 06/22/24 1558 06/22/24 2028 06/23/24 0853 06/23/24 1221  GLUCAP 226* 267* 266* 203* 203*    Microbiology: Results for orders placed or performed during the hospital encounter of 06/13/24  Blood Culture (routine x 2)     Status: None   Collection Time: 06/13/24  3:32 PM   Specimen: BLOOD RIGHT ARM  Result Value Ref Range Status   Specimen Description BLOOD RIGHT ARM  Final   Special Requests   Final    BOTTLES DRAWN AEROBIC AND ANAEROBIC Blood Culture results may not be optimal due to an inadequate volume of blood received in culture bottles   Culture   Final    NO GROWTH 5 DAYS Performed at [facility], [address] [place], [place] [address]    Report Status 06/18/2024 FINAL  Final  Blood Culture (routine x 2)     Status: None   Collection Time: 06/13/24  4:37 PM   Specimen: BLOOD  Result Value Ref Range  Status   Specimen Description BLOOD BLOOD LEFT FOREARM  Final   Special Requests   Final    BOTTLES DRAWN AEROBIC AND ANAEROBIC Blood Culture results may not be optimal due to an inadequate volume of blood received in culture bottles   Culture   Final    NO GROWTH 5 DAYS Performed at [facility], [address], [place], [place] [address]    Report Status 06/18/2024 FINAL  Final  Aerobic/Anaerobic Culture w Gram Stain (surgical/deep wound)     Status: None   Collection Time: 06/17/24 12:12 PM   Specimen: Wound; Tissue  Result Value Ref Range Status   Specimen Description TISSUE SCROTUM  Final   Special Requests  FROM SCROTAL ABSCESS  Final   Gram Stain   Final    NO WBC SEEN FEW GRAM POSITIVE COCCI FEW  GRAM POSITIVE RODS RARE GRAM NEGATIVE RODS    Culture   Final    RARE KLEBSIELLA PNEUMONIAE Confirmed Extended Spectrum Beta-Lactamase Producer (ESBL).  In bloodstream infections from ESBL organisms, carbapenems are preferred over piperacillin /tazobactam. They are shown to have a lower risk of mortality. Two isolates with different morphologies were identified as the same organism.The most resistant organism was reported. FEW PREVOTELLA BIVIA BETA LACTAMASE POSITIVE FEW ACTINOMYCES SPECIES Standardized susceptibility testing for this organism is not available. Performed at [facility], [address], [place], [place] [address]    Report Status 06/23/2024 FINAL  Final   Organism ID, Bacteria KLEBSIELLA PNEUMONIAE  Final      Susceptibility   Klebsiella pneumoniae - MIC*    AMPICILLIN  >=32 RESISTANT Resistant     CEFEPIME  <=0.12 SENSITIVE Sensitive     CEFTAZIDIME RESISTANT Resistant     CEFTRIAXONE  <=0.25 SENSITIVE Sensitive     CIPROFLOXACIN  <=0.25 SENSITIVE Sensitive     GENTAMICIN <=1 SENSITIVE Sensitive     IMIPENEM 1 SENSITIVE Sensitive     TRIMETH /SULFA  <=20 SENSITIVE Sensitive     AMPICILLIN /SULBACTAM >=32 RESISTANT Resistant     PIP/TAZO <=4 SENSITIVE Sensitive ug/mL    * RARE KLEBSIELLA PNEUMONIAE  Aerobic/Anaerobic Culture w Gram Stain (surgical/deep wound)     Status: None   Collection Time: 06/17/24 12:13 PM   Specimen: Wound; Abscess  Result Value Ref Range Status   Specimen Description   Final    WOUND Performed at [facility], [address] [place], [place] [address]    Special Requests   Final     SCROTAL ABSCESS Performed at [facility], [address] [place], [place] [address]    Gram Stain   Final    ABUNDANT WBC PRESENT, PREDOMINANTLY PMN MODERATE GRAM POSITIVE COCCI FEW GRAM NEGATIVE RODS RARE GRAM POSITIVE RODS    Culture   Final    RARE KLEBSIELLA PNEUMONIAE MODERATE PREVOTELLA BIVIA BETA LACTAMASE  POSITIVE ACTINOMYCES SPECIES Standardized susceptibility testing for this organism is not available. Performed at [facility], [address] [place], [place] [address]    Report Status 06/22/2024 FINAL  Final   Organism ID, Bacteria KLEBSIELLA PNEUMONIAE  Final      Susceptibility   Klebsiella pneumoniae - MIC*    AMPICILLIN  >=32 RESISTANT Resistant     CEFEPIME  <=0.12 SENSITIVE Sensitive     CEFTAZIDIME <=1 SENSITIVE Sensitive     CEFTRIAXONE  <=0.25 SENSITIVE Sensitive     CIPROFLOXACIN  <=0.25 SENSITIVE Sensitive     GENTAMICIN <=1 SENSITIVE Sensitive     IMIPENEM <=0.25 SENSITIVE Sensitive  TRIMETH /SULFA  <=20 SENSITIVE Sensitive     AMPICILLIN /SULBACTAM 4 SENSITIVE Sensitive     PIP/TAZO <=4 SENSITIVE Sensitive ug/mL    * RARE KLEBSIELLA PNEUMONIAE    Coagulation Studies: No results for input(s): LABPROT, INR in the last 72 hours.  Urinalysis: No results for input(s): COLORURINE, LABSPEC, PHURINE, GLUCOSEU, HGBUR, BILIRUBINUR, KETONESUR, PROTEINUR, UROBILINOGEN, NITRITE, LEUKOCYTESUR in the last 72 hours.  Invalid input(s): APPERANCEUR    Imaging: No results found.   Medications:    meropenem  (MERREM ) IV 1 g (06/23/24 0922)    amLODipine   10 mg Oral Daily   ascorbic acid   500 mg Oral Daily   atorvastatin   40 mg Oral Daily   Chlorhexidine  Gluconate Cloth  6 each Topical Daily   cyanocobalamin   1,000 mcg Oral Daily   enoxaparin  (LOVENOX ) injection  40 mg Subcutaneous Q24H   famotidine   10 mg Oral BID   feeding supplement (GLUCERNA SHAKE)  237 mL Oral TID BM   fenofibrate   54 mg Oral Daily   ferrous sulfate   325 mg Oral Q breakfast   hydrALAZINE   50 mg Oral Q8H   insulin  aspart  8 Units Subcutaneous TID WC   insulin  glargine-yfgn  40 Units Subcutaneous QHS   pantoprazole   40 mg Oral Daily   pregabalin   75 mg Oral BID   sertraline   100 mg Oral Daily   traZODone   100 mg Oral QHS   acetaminophen , HYDROmorphone  (DILAUDID )  injection, HYDROmorphone , hydrOXYzine , loperamide  HCl, ondansetron  (ZOFRAN ) IV, mouth rinse  Assessment/ Plan:  Richard Mendoza is a 48 y.o.  male with past medical conditions including PVD on Eliquis , type 1 diabetes with insulin  pump, hypertension, hyperlipidemia, left AKA, CKD stage III B,, who was admitted to [facility] on 06/13/2024 for Cellulitis of perineum [L03.315] Cellulitis of scrotum [N49.2]     Acute kidney injury on chronic kidney disease stage IIIb.  Baseline creatinine appears to be 2.09 with GFR 39 on 02/05/2024.  Acute kidney injury likely secondary to infection.  Creatinine 3.16 on admission.  CT pelvis negative for obstruction.  No IV contrast exposure.  No acute indication for dialysis.  Renal function slightly elevated today.  Will continue to monitor for now.  Continue to avoid nephrotoxic agents and therapies, if possible.  If Lab Results  Component Value Date   CREATININE 2.66 (H) 06/23/2024   CREATININE 2.12 (H) 06/21/2024   CREATININE 2.74 (H) 06/19/2024     Intake/Output Summary (Last 24 hours) at 06/23/2024 1516 Last data filed at 06/23/2024 1300 Gross per 24 hour  Intake 440 ml  Output 1125 ml  Net -685 ml      2. Anemia of chronic kidney disease Hemoglobin 10.2 today Will continue to monitor for need of ESA.   3. Acute metabolic acidosis, likely secondary to kidney injury.  S bicarb 22. Resolved.    4.  Insulin -dependent diabetes with chronic kidney disease/renal manifestations:  Home regimen includes NovoLog  and Lantus . Most recent hemoglobin A1c is 9.2 on 05/25/2024.              Glucose elevated at times.  Insulin  managed by primary team.  5.  Sepsis due to cellulitis of scrotum.  Currently on meropenem .  Dose adjusted by pharmacy.   LOS: 10 Delma Villalva 8/5/20253:16 PM

## 2024-06-23 NOTE — TOC Progression Note (Addendum)
 Transition of Care [facility]) - Progression Note    Patient Details  Name: Richard Mendoza MRN: [id number] Date of Birth: 04-Nov-1976  [facility] CM/SW Contact  Lauraine [name], LCSW Phone Number: 06/23/2024, 8:50 AM  Clinical Narrative:   PASARR still pending.  11:09 am: PASARR obtained: [phone] E. Expires 9/4. Reviewed bed offers with patient. He has accepted offer from [facility] if he can have a private room. Left message for liaison to notify. Mom will transport at discharge.  1:54 pm: Asked SNF liaison to start insurance authorization.  Expected Discharge Plan: Skilled Nursing Facility Barriers to Discharge: Continued Medical Work up               Expected Discharge Plan and Services       Living arrangements for the past 2 months: Single Family Home                                       Social Drivers of Health (SDOH) Interventions SDOH Screenings   Food Insecurity: No Food Insecurity (06/14/2024)  Housing: High Risk (06/22/2024)  Transportation Needs: Unmet Transportation Needs (06/14/2024)  Utilities: Not At Risk (06/14/2024)  Depression (PHQ2-9): High Risk (05/19/2024)  Financial Resource Strain: Low Risk  (07/24/2022)   Received from [facility]  Social Connections: Socially Isolated (06/14/2024)  Stress: No Stress Concern Present (07/24/2022)   Received from [facility]  Tobacco Use: Medium Risk (06/13/2024)    Readmission Risk Interventions    01/24/2024   12:35 PM 08/21/2023    9:03 AM  Readmission Risk Prevention Plan  Transportation Screening Complete Complete  PCP or Specialist Appt within 3-5 Days Complete Complete  HRI or Home Care Consult Complete   Social Work Consult for Recovery Care Planning/Counseling Complete Complete  Palliative Care Screening Not Applicable Not Applicable  Medication Review Oceanographer) Complete Complete

## 2024-06-23 NOTE — Inpatient Diabetes Management (Signed)
 Inpatient Diabetes Program Recommendations  AACE/ADA: New Consensus Statement on Inpatient Glycemic Control   Target Ranges:  Prepandial:   less than 140 mg/dL      Peak postprandial:   less than 180 mg/dL (1-2 hours)      Critically ill patients:  140 - 180 mg/dL    Latest Reference Range & Units 06/23/24 04:54  Glucose 70 - 99 mg/dL 749 (H)     Latest Reference Range & Units 06/22/24 08:33 06/22/24 12:08 06/22/24 15:58 06/22/24 20:28  Glucose-Capillary 70 - 99 mg/dL 756 (H) 773 (H) 732 (H) 266 (H)   Review of Glycemic Control  Diabetes history: DM2 Outpatient Diabetes medications: Lantus  60 units daily, Humalog 10-16  units TID with meals Current orders for Inpatient glycemic control: Semglee  40 units at bedtime, Novolog  5 units TID with meals   Inpatient Diabetes Program Recommendations:     Insulin : CBGs ranged from 226-267 mg/dl on 8/4 and lab glucose 250 mg/dl this morning.  Please consider increasing meal coverage insulin  to 8 units TID with meals and adding Novolog  0-9 units TID with meals and Novolog  0-5 units at bedtime for correction.     NOTE: Patient admitted 06/13/24 with sepsis due to cellulitis of scrotum. Per chart review, patient seen MICAEL Rio, NP at [facility] End[facility] on 06/12/24 and was advised to increase Lantus  to 60 units at bedtime, continue Novolog  10-16 units TID with meals.   Thanks, Earnie Gainer, RN, MSN, CDCES Diabetes Coordinator Inpatient Diabetes Program [phone] (Team Pager from 8am to 5pm)

## 2024-06-23 NOTE — Hospital Course (Signed)
 47 y.o. male with medical history significant of status post left AKA, PVD on Eliquis , DM-type I on insulin  pump, hypertension, chronic pain syndrome, CKD-3a, HLD, tobacco abuse, who presents with scrotal pain and fever.    Assessment and Plan:   Sepsis - Fever, leukocytosis, tachycardia, lactic acidosis, scrotal abscess, given concern for sepsis.  Blood cultures, IV fluid bolus, empiric antibiotics on board.  Showing improvement.   Colitis of scrotum with abscess - Repeat CT scan 7/29 showed developing abscess.  S/p incision and drainage 7/30 by urology.  Wound culture revealed ESBL Klebsiella.  Broad-spectrum antibiotic coverage narrowed to meropenem  8/4.  Infectious disease on board.  Planning for discharge on IV ertapenem for total of 2 weeks.  Continue wet-to-dry dressing changes.  IV pain control improved as needed.   AKI on CKD 3B - Etiology unclear, likely exacerbated by infection and nephrotoxic agents.  Creatinine max 4.55.  Now downtrending to 2.66.  Holding nephrotoxic agents and diuretics.  Will recheck BMP in AM.   Hypertension - Lasix  and losartan on hold secondary to AKI.  Continue amlodipine , hydralazine .   Anemia on anemia of chronic disease - Iron  studies showing iron  deficiency anemia with low TIBC suggestive of anemia of chronic disease.  Likely exacerbated by infection procedure.  Holding off on p.o. iron  supplementation until infection resolved.  Will DC with p.o. iron  supplementation.   Acute urinary retention - Initial difficulty given cellulitis and abscess.  Foley catheter since removed.  Able to void.  Resolved.   Uncontrolled type 1 diabetes mellitus - A1c 9.2 suggesting poor control.  Continues on insulin  glargine 40 units daily with meal coverage and insulin  sliding scale.   Chronic HFpEF - Does not appear to be in acute exacerbation.   Peripheral artery disease - Continue Eliquis , Lipitor.   Chronic pain syndrome - Continue home oral Dilaudid .   Lyrica .   Depression - Sertraline  and trazodone  on board.   Physical debilitation muscle weakness - Evaluated by PT.  Recommending SNF.

## 2024-06-23 NOTE — Plan of Care (Signed)
  Problem: Education: Goal: Ability to describe self-care measures that may prevent or decrease complications (Diabetes Survival Skills Education) will improve Outcome: Progressing   Problem: Coping: Goal: Ability to adjust to condition or change in health will improve Outcome: Progressing   Problem: Fluid Volume: Goal: Ability to maintain a balanced intake and output will improve Outcome: Progressing   Problem: Health Behavior/Discharge Planning: Goal: Ability to identify and utilize available resources and services will improve Outcome: Progressing Goal: Ability to manage health-related needs will improve Outcome: Progressing   Problem: Nutritional: Goal: Maintenance of adequate nutrition will improve Outcome: Progressing   Problem: Skin Integrity: Goal: Risk for impaired skin integrity will decrease Outcome: Progressing   Problem: Tissue Perfusion: Goal: Adequacy of tissue perfusion will improve Outcome: Progressing   Problem: Clinical Measurements: Goal: Cardiovascular complication will be avoided Outcome: Progressing   Problem: Nutrition: Goal: Adequate nutrition will be maintained Outcome: Progressing   Problem: Elimination: Goal: Will not experience complications related to bowel motility Outcome: Progressing Goal: Will not experience complications related to urinary retention Outcome: Progressing   Problem: Pain Managment: Goal: General experience of comfort will improve and/or be controlled Outcome: Progressing   Problem: Clinical Measurements: Goal: Ability to avoid or minimize complications of infection will improve Outcome: Progressing

## 2024-06-23 NOTE — Plan of Care (Signed)

## 2024-06-23 NOTE — Discharge Instructions (Signed)
 Rent/Utility/Housing  Agency Name: [facility] Agency Address: [address] [place], [place] [address] Phone: [phone] Email: [email or web address] Website: [email or web address] Service(s) Offered: Housing services, self-sufficiency, congregate meal program, weatherization program, Field seismologist program, emergency food assistance,  housing counseling, home ownership program, wheels -towork program.  Agency Name: [facility] Address: [address] [place], [place] [address] Phone: [phone] (8a-4p) [phone] (8p- 10p) Email: [email or web address] Website: [email or web address] Service(s) Offered: A program for homeless and/or needy men that includes one-on-one counseling, life skills training and job rehabilitation.  Agency Name: [facility] Address: [address], [place], [place] [address] Phone: [phone] Website: www.[email or web address].org Service(s) Offered: Assistance to needy in emergency with utility bills, heating fuel, and prescriptions. Shelter for homeless 7pm-7am. March 14, 2017 15  Agency Name: [facility] [place] [facility]Development[facility] Disabled) Address: [address], [place], [place] [address] Phone: [phone] Contact Person: Genie Key Email: [email or web address] Website: [email or web address] Service(s) Offered: Helps individuals with developmental disabilities move from housing that is more restrictive to homes where they  can achieve greater independence and have more  opportunities.  Agency Name: [facility] Address: [address], [place], [place] [address] Phone: [phone] Email: [email or web address] Website: [email or web address].org Service(s) Offered: Provides affordable housing for low-income families, elderly, and disabled individuals. Offer a wide range of  programs and services, from financial planning to  afterschool and summer programs.  Agency Name: [facility] Address: [address] [place], [place] [address] Phone: [phone] Service(s) Offered: Child support services; child welfare services; food stamps; Medicaid; work first family assistance; and aid with fuel,  rent, food and medicine.  Agency Name: [facility] [place] [employer]. Address: [facility] [address] [place], [place]  [address] Phone: [phone] Website: www.familyabuseservices.org Service(s) Offered: 24 hour Crisis Line: [phone]; 24 hour Emergency Shelter; Transitional Housing; Support Groups; Scientist, physiological; [employer]; Hispanic Outreach: [phone];  Visitation Center: [phone].  Agency Name: [facility] [place]. Address: [address] [place], [place] [address] Phone: [phone] Service(s) Offered: CAP Services; Home and [employer]; Individual or Group Supports; Respite Care Non-Institutional Nursing;  Residential Supports; Respite Care and Personal Care Services; Transportation; Family and Friends Night; Recreational Activities; Three Nutritious Meals/Snacks; Consultation with Registered Dietician; Twenty-four hour Registered Nurse Access; Daily and [employer]; Camp Green Leaves; Aten for the [employer] (During Summer Months) Bingo Night (Every  Wednesday Night); Special Populations Dance Night  (Every Tuesday Night); Professional Hair Care Services.  Agency Name: [facility] Address: [address], [place], [place] [address] Phone: [phone] Contact Person: Richardo Chandler Website: [email or web address].Physicist, medical) Offered: Residential treatment facility for women; food and  clothing, educational & employment development and  transportation to work; Counsellor of financial skills;  parenting and family reunification; emotional and spiritual  support;  transitional housing for program graduates.  Agency Name: [facility] Address: [address] [place], [place] [address] Phone: [phone] Email: [email or web address] Website: [email or web address].es Service(s) Offered: Public housing units for elderly, disabled, and low income people; housing choice vouchers for income eligible  applicants; shelter plus care vouchers; and Psychologist, clinical.  Agency Name: [facility] [employer] Address: [address] [place], [place] [address] Phone: [phone] Email: [email or web address] Website: www.habitatalamance.org Service(s) Offered: Build houses for families in need of decent housing. Each adult in the family must invest 200 hours of labor on  someone else's house, work with volunteers to build their own house, attend classes  on budgeting, home maintenance, yard care, and attend homeowner association meetings.  Agency Name: [facility] Address: [address], [place], [place] [address] Phone: [phone] Website: www.rsli.org Service(s) Offered: Intermediate care facilities for intellectually delayed, Supervised Living in group homes for adults with developmental disabilities, Supervised Living for people who have dual diagnoses (MRMI), Independent Living, Supported Living, respite and a variety of CAP services, pre-vocational services, day supports, and [employer].  Agency Name: [place] [employer] [facility] Phone: [phone] Website: www.NCForeclosurePrevention.gov Service(s) Offered: Zero-interest, deferred loans to homeowners struggling to pay their mortgage. Call for more information.      Transportation Resources for [employer]  Agency Name: [facility] Agency Address: [address] [place], [place] [address] Phone: [phone] Email: [email or web address] Website: www.[email or web address].org Service(s) Offered: Housing  services, self-sufficiency, congregate meal program, weatherization program, Field seismologist program, emergency food assistance,  housing counseling, home ownership program, wheels-towork program.  Agency Name: [facility] [employer] [phone]) Address: [address] [place], [place] [address] Phone: [phone] Website: www.acta-[place].[email or web address] Service(s) Offered: Transportation for [employer], subscription and demand response; Dial-a-Ride for citizens 110 years of age or older.  Agency Name: Department of Social Services Address: [address] [place], [place] [address] Phone: [phone] Service(s) Offered: Child support services; child welfare services; food stamps; Medicaid; work first family assistance; and aid with fuel,  rent, food and medicine, transportation assistance.  Agency Name: Disabled [employer] (DAV) Transportation  Network Phone: [phone] Service(s) Offered: Transports veterans to the [facility] medical center. Call  forty-eight hours in advance and leave the name, telephone  number, date, and time of appointment. Veteran will be  contacted by the driver the day before the appointment to  arrange a pick up point    [employer] ACTA currently provides door to door services. ACTA connects with PART daily for services to [facility]. ACTA also performs contract services to [employer] operates 27 vehicles, all but 3 mini-vans are equipped with lifts for special needs as well as the general public. ACTA drivers are each CDL certified and trained in First Aid and CPR. ACTA was established in 2002 by [employer]. An independent Industrial/product designer. ACTA operates via Cytogeneticist with required local 10% match funding from [place]. ACTA provides over 80,000 passenger trips each year, including [facility] and [employer] sites.  Call at least by 11 AM one business day prior to needing transportation  [employer].                      [place], [place] [address]     Office Hours: Monday-Friday  8 AM - 5 PM

## 2024-06-23 NOTE — Progress Notes (Signed)
 Date of Admission:  06/13/2024      ID: Richard Mendoza is a 48 y.o. 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)   Chronic diastolic CHF (congestive heart failure) (HCC)   Depression   Acute renal failure superimposed on stage 3b chronic kidney disease (HCC)   Cellulitis of perineum   Scrotal abscess    Subjective: Doing well   Medications:   amLODipine   10 mg Oral Daily   ascorbic acid   500 mg Oral Daily   atorvastatin   40 mg Oral Daily   Chlorhexidine  Gluconate Cloth  6 each Topical Daily   cyanocobalamin   1,000 mcg Oral Daily   enoxaparin  (LOVENOX ) injection  40 mg Subcutaneous Q24H   famotidine   10 mg Oral BID   feeding supplement (GLUCERNA SHAKE)  237 mL Oral TID BM   fenofibrate   54 mg Oral Daily   ferrous sulfate   325 mg Oral Q breakfast   hydrALAZINE   50 mg Oral Q8H   insulin  aspart  8 Units Subcutaneous TID WC   insulin  glargine-yfgn  40 Units Subcutaneous QHS   pantoprazole   40 mg Oral Daily   pregabalin   75 mg Oral BID   sertraline   100 mg Oral Daily   traZODone   100 mg Oral QHS    Objective: Vital signs in last 24 hours: Patient Vitals for the past 24 hrs:  BP Temp Temp src Pulse Resp SpO2 Weight  06/23/24 0913 135/72 -- -- -- -- -- --  06/23/24 0852 135/72 97.8 F (36.6 C) Oral 65 20 98 % --  06/23/24 0500 -- -- -- -- -- -- 88.7 kg  06/23/24 0354 (!) 98/43 97.9 F (36.6 C) -- 65 19 100 % --  06/22/24 2339 131/71 98 F (36.7 C) -- 76 (!) 22 97 % --  06/22/24 2029 (!) 149/82 98.2 F (36.8 C) -- 83 20 98 % --  06/22/24 1601 (!) 146/80 98 F (36.7 C) -- 76 18 100 % --  06/22/24 1211 (!) 148/80 98.3 F (36.8 C) Oral 76 18 99 % --     Lines and Device Date on  insertion # of days DC  Engineer, technical sales     ETT       PHYSICAL EXAM:  General: Alert, cooperative, no distress, appears stated age.  Lungs: Clear to auscultation bilaterally. No Wheezing or Rhonchi. No rales. Heart: Regular rate and rhythm, no murmur, rub or gallop. Abdomen: Soft, non-tender,not distended. Bowel sounds normal. No masses scrotum examined Swelling so much better   Extremities: left AKA Skin: No rashes or lesions. Or bruising Lymph: Cervical, supraclavicular normal. Neurologic: Grossly non-focal  Lab Results    Latest Ref Rng & Units 06/21/2024    6:33 AM 06/18/2024    9:51 AM 06/17/2024   10:28 AM  CBC  WBC 4.0 - 10.5 K/uL 9.5  11.3  11.7   Hemoglobin 13.0 - 17.0 g/dL 89.7  8.8  8.1   Hematocrit 39.0 - 52.0 % 31.1  27.8  24.9   Platelets 150 - 400 K/uL 366  302  259        Latest Ref Rng & Units 06/23/2024    4:54  AM 06/21/2024    6:33 AM 06/19/2024    4:20 AM  CMP  Glucose 70 - 99 mg/dL 749  858  802   BUN 6 - 20 mg/dL 33  22  34   Creatinine 0.61 - 1.24 mg/dL 7.33  7.87  7.25   Sodium 135 - 145 mmol/L 139  140  139   Potassium 3.5 - 5.1 mmol/L 3.6  3.8  4.1   Chloride 98 - 111 mmol/L 110  109  112   CO2 22 - 32 mmol/L 22  22  20    Calcium  8.9 - 10.3 mg/dL 8.6  8.9  8.5       Microbiology: BC- NG WC- klebsiella pneumoniae, anerobes, actinomyces     Assessment/Plan: Scrotal abscess with surrounding cellulitis- s/p I/D Culture so far klebsiella pneumoniae, actinomyces, prevotella Zosyn  changed to meropenem  as Kleb is ESBL Initial plan was tol do IV ertapenem for a total of 2 weeks, but we have oral antibiotic option of cipro  + augmentin  for 2 weeks- from the date of I/D- so would be 06/30/24) and he would like to take oral antibiotics instead of IV  Adjust dose of both antibiotics to crcl    AKI on CKD- improving Combination of infection, meds , ? Prerenal decrased intake Was on vanco + zosyn - vanco has been Dc and started  linezolid  HE also got a dose of ibuprofen ,no IV contrast  Anemia    DM- last A1c from 7/7 is 9.2 ( was 13.6 in March 2025)   Left AKA   Anxiety /Depression- on sertraline  and trazadone- watch closely for any serotonin syndrome with linezolid   Discussed the management with the patient and hospitalist Will follow as OP on 06/30/24 at 11.45am ID will sign off- call if needed

## 2024-06-24 DIAGNOSIS — N492 Inflammatory disorders of scrotum: Secondary | ICD-10-CM | POA: Diagnosis not present

## 2024-06-24 DIAGNOSIS — N183 Chronic kidney disease, stage 3 unspecified: Secondary | ICD-10-CM | POA: Insufficient documentation

## 2024-06-24 DIAGNOSIS — I10 Essential (primary) hypertension: Secondary | ICD-10-CM | POA: Diagnosis not present

## 2024-06-24 DIAGNOSIS — E1040 Type 1 diabetes mellitus with diabetic neuropathy, unspecified: Secondary | ICD-10-CM

## 2024-06-24 LAB — BASIC METABOLIC PANEL WITH GFR
Anion gap: 6 (ref 5–15)
BUN: 31 mg/dL — ABNORMAL HIGH (ref 6–20)
CO2: 23 mmol/L (ref 22–32)
Calcium: 8.5 mg/dL — ABNORMAL LOW (ref 8.9–10.3)
Chloride: 111 mmol/L (ref 98–111)
Creatinine, Ser: 2.3 mg/dL — ABNORMAL HIGH (ref 0.61–1.24)
GFR, Estimated: 34 mL/min — ABNORMAL LOW (ref >60)
Glucose, Bld: 171 mg/dL — ABNORMAL HIGH (ref 70–99)
Potassium: 3.7 mmol/L (ref 3.5–5.1)
Sodium: 140 mmol/L (ref 135–145)

## 2024-06-24 LAB — GLUCOSE, CAPILLARY
Glucose-Capillary: 143 mg/dL — ABNORMAL HIGH (ref 70–99)
Glucose-Capillary: 155 mg/dL — ABNORMAL HIGH (ref 70–99)
Glucose-Capillary: 188 mg/dL — ABNORMAL HIGH (ref 70–99)
Glucose-Capillary: 197 mg/dL — ABNORMAL HIGH (ref 70–99)

## 2024-06-24 LAB — CBC
HCT: 28 % — ABNORMAL LOW (ref 39.0–52.0)
Hemoglobin: 8.9 g/dL — ABNORMAL LOW (ref 13.0–17.0)
MCH: 24.8 pg — ABNORMAL LOW (ref 26.0–34.0)
MCHC: 31.8 g/dL (ref 30.0–36.0)
MCV: 78 fL — ABNORMAL LOW (ref 80.0–100.0)
Platelets: 350 K/uL (ref 150–400)
RBC: 3.59 MIL/uL — ABNORMAL LOW (ref 4.22–5.81)
RDW: 16.6 % — ABNORMAL HIGH (ref 11.5–15.5)
WBC: 9.6 K/uL (ref 4.0–10.5)
nRBC: 0 % (ref 0.0–0.2)

## 2024-06-24 LAB — RESP PANEL BY RT-PCR (RSV, FLU A&B, COVID)  RVPGX2
Influenza A by PCR: NEGATIVE
Influenza B by PCR: NEGATIVE
Resp Syncytial Virus by PCR: NEGATIVE
SARS Coronavirus 2 by RT PCR: NEGATIVE

## 2024-06-24 LAB — MAGNESIUM: Magnesium: 2.3 mg/dL (ref 1.7–2.4)

## 2024-06-24 MED ORDER — AMOXICILLIN-POT CLAVULANATE 875-125 MG PO TABS
1.0000 | ORAL_TABLET | Freq: Two times a day (BID) | ORAL | Status: DC
  Administered 2024-06-25: 1 via ORAL
  Filled 2024-06-24: qty 1

## 2024-06-24 MED ORDER — CIPROFLOXACIN HCL 500 MG PO TABS
500.0000 mg | ORAL_TABLET | Freq: Two times a day (BID) | ORAL | Status: DC
  Administered 2024-06-25: 500 mg via ORAL
  Filled 2024-06-24 (×2): qty 1

## 2024-06-24 MED ORDER — FERROUS SULFATE 325 (65 FE) MG PO TABS
325.0000 mg | ORAL_TABLET | Freq: Every day | ORAL | Status: DC
  Administered 2024-06-25: 325 mg via ORAL
  Filled 2024-06-24: qty 1

## 2024-06-24 NOTE — Progress Notes (Signed)
 Progress Note   Patient: Richard Mendoza FMW:[id number] DOB: February 01, 1976 DOA: 06/13/2024     11 DOS: the patient was seen and examined on 06/24/2024   Brief hospital course: 48 y.o. male with medical history significant of status post left AKA, PVD on Eliquis , DM-type I on insulin  pump, hypertension, chronic pain syndrome, CKD-3a, HLD, tobacco abuse, who presents with scrotal pain and fever.  CT scan showed scrotal abscess.  Urology has performed I&D on 7/30.  Culture came back with ESBL Klebsiella.  Patient is seen by ID, started on meropenem . Condition has improved, pending nursing home placement.   Principal Problem:   Cellulitis of scrotum Active Problems:   Sepsis (HCC)   Essential hypertension   PAD (peripheral artery disease) (HCC)   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)   Chronic diastolic CHF (congestive heart failure) (HCC)   HLD (hyperlipidemia)   Acute renal failure superimposed on stage 3b chronic kidney disease (HCC)   Iron  deficiency anemia, unspecified   Chronic pain syndrome   Depression   Overweight (BMI 25.0-29.9)   Cellulitis of perineum   Scrotal abscess   Anemia of chronic kidney failure, stage 3 (moderate) (HCC)   Assessment and Plan: Severe sepsis secondary to scrotal abscess Scrotum cellulitis with abscess.  Secondary to ESBL Klebsiella. Reviewed the chart, patient met sepsis criteria with significant fever, leukocytosis and tachycardia and a lactic acidosis.  He also has significant organ failure with elevated lactic acid and acute renal failure.  He met severe sepsis criteria. This is due to scrotal abscess.  He is status post I&D, currently on meropenem .  Planning to discharge with oral antibiotics Cipro  and Augmentin  for 2 weeks. Appreciate ID consult.  Acute kidney injury on chronic kidney stage IIIb. This is secondary to severe sepsis.  Condition had improved, renal function seem to be back to baseline.  Patient followed by  nephrology.  Essential hypertension. Continue home medicines.  Acute urinary retention secondary to scrotal abscess. Condition has resolved.  Uncontrolled type 1 diabetes with hyperglycemia A1c 9.2, continue insulin  glargine and sliding scale insulin  and meal coverage.  Chronic diastolic congestive heart failure. Appears to be euvolemic, no exacerbation.  Peripheral arterial disease. Status post left AKA. Continue to follow,  Chronic pain syndrome. Continue as needed pain medicine.        Subjective:  Patient doing better today, scrotal pain better controlled.  Physical Exam: Vitals:   06/24/24 0430 06/24/24 0500 06/24/24 0727 06/24/24 1208  BP: 109/60  135/73 (!) 144/79  Pulse: 64  68 88  Resp: 18     Temp: 97.7 F (36.5 C)  98.1 F (36.7 C) 97.7 F (36.5 C)  TempSrc:      SpO2: 99%  100% 99%  Weight:  90.1 kg    Height:       General exam: Appears calm and comfortable  Respiratory system: Clear to auscultation. Respiratory effort normal. Cardiovascular system: S1 & S2 heard, RRR. No JVD, murmurs, rubs, gallops or clicks. No pedal edema. Gastrointestinal system: Abdomen is nondistended, soft and nontender. No organomegaly or masses felt. Normal bowel sounds heard. Central nervous system: Alert and oriented. No focal neurological deficits. Extremities: Left BKA. Skin: No rashes, lesions or ulcers Psychiatry: Judgement and insight appear normal. Mood & affect appropriate.    Data Reviewed:  Lab results reviewed.  Family Communication: None  Disposition: Status is: Inpatient Remains inpatient appropriate because: Severity of disease, IV treatment. Unsafe discharge pending nursing home placement  Time spent: 35 minutes  Author: Murvin Mana, MD 06/24/2024 12:57 PM  For on call review [email or web address].

## 2024-06-24 NOTE — Progress Notes (Signed)
 [facility]  ROUNDING NOTE   Subjective:  Admitted with scrotal abscess requiring I&d by urology. Foley catheter placed. Followed by ID. Placed on pip/tazo and linazolid.  Now on Linezolid   Patient sitting up in bed Alert and oriented Denies pain or discomfort Appetite appropriate, denies nausea  Creatinine 2.30   Objective:  Vital signs in last 24 hours:  Temp:  [97.7 F (36.5 C)-98.3 F (36.8 C)] 97.7 F (36.5 C) (08/06 1208) Pulse Rate:  [64-95] 88 (08/06 1208) Resp:  [18] 18 (08/06 0430) BP: (109-176)/(53-90) 144/79 (08/06 1208) SpO2:  [98 %-100 %] 99 % (08/06 1208) Weight:  [90.1 kg] 90.1 kg (08/06 0500)  Weight change: 1.4 kg Filed Weights   06/22/24 0442 06/23/24 0500 06/24/24 0500  Weight: 89.5 kg 88.7 kg 90.1 kg    Intake/Output: I/O last 3 completed shifts: In: 660 [P.O.:360; IV Piggyback:300] Out: 1750 [Urine:1750]   Intake/Output this shift:  Total I/O In: 120 [P.O.:120] Out: -   Physical Exam: General: NAD  Head: Normocephalic  Eyes: Anicteric  Lungs:  Clear, room air  Heart: Regular rate   Abdomen:  Soft, nontender  Extremities: No peripheral edema.  Neurologic: Alert and oriented  Skin: No rashes, scrotal dressing  Access: None    Basic Metabolic Panel: Recent Labs  Lab 06/18/24 0902 06/19/24 0420 06/21/24 0633 06/23/24 0454 06/24/24 0516  NA 137 139 140 139 140  K 4.3 4.1 3.8 3.6 3.7  CL 106 112* 109 110 111  CO2 20* 20* 22 22 23   GLUCOSE 337* 197* 141* 250* 171*  BUN 35* 34* 22* 33* 31*  CREATININE 2.95* 2.74* 2.12* 2.66* 2.30*  CALCIUM  9.1 8.5* 8.9 8.6* 8.5*  MG  --   --  2.0  --  2.3  PHOS  --   --  2.6 3.1  --     Liver Function Tests: Recent Labs  Lab 06/21/24 0633 06/23/24 0454  ALBUMIN 2.6* 2.4*   No results for input(s): LIPASE, AMYLASE in the last 168 hours. No results for input(s): AMMONIA in the last 168 hours.  CBC: Recent Labs  Lab 06/18/24 0951 06/21/24 0633 06/24/24 0516  WBC  11.3* 9.5 9.6  HGB 8.8* 10.2* 8.9*  HCT 27.8* 31.1* 28.0*  MCV 77.4* 76.6* 78.0*  PLT 302 366 350    Cardiac Enzymes: No results for input(s): CKTOTAL, CKMB, CKMBINDEX, TROPONINI in the last 168 hours.  BNP: Invalid input(s): POCBNP  CBG: Recent Labs  Lab 06/23/24 1221 06/23/24 1629 06/23/24 2031 06/24/24 0725 06/24/24 1205  GLUCAP 203* 151* 184* 143* 155*    Microbiology: Results for orders placed or performed during the hospital encounter of 06/13/24  Blood Culture (routine x 2)     Status: None   Collection Time: 06/13/24  3:32 PM   Specimen: BLOOD RIGHT ARM  Result Value Ref Range Status   Specimen Description BLOOD RIGHT ARM  Final   Special Requests   Final    BOTTLES DRAWN AEROBIC AND ANAEROBIC Blood Culture results may not be optimal due to an inadequate volume of blood received in culture bottles   Culture   Final    NO GROWTH 5 DAYS Performed at [facility], [address], [place], [place] [address]    Report Status 06/18/2024 FINAL  Final  Blood Culture (routine x 2)     Status: None   Collection Time: 06/13/24  4:37 PM   Specimen: BLOOD  Result Value Ref Range Status   Specimen Description BLOOD  BLOOD LEFT FOREARM  Final   Special Requests   Final    BOTTLES DRAWN AEROBIC AND ANAEROBIC Blood Culture results may not be optimal due to an inadequate volume of blood received in culture bottles   Culture   Final    NO GROWTH 5 DAYS Performed at [facility], [address] [place], [place] [address]    Report Status 06/18/2024 FINAL  Final  Aerobic/Anaerobic Culture w Gram Stain (surgical/deep wound)     Status: None   Collection Time: 06/17/24 12:12 PM   Specimen: Wound; Tissue  Result Value Ref Range Status   Specimen Description TISSUE SCROTUM  Final   Special Requests  FROM SCROTAL ABSCESS  Final   Gram Stain   Final    NO WBC SEEN FEW GRAM POSITIVE COCCI FEW GRAM POSITIVE RODS RARE GRAM NEGATIVE RODS    Culture    Final    RARE KLEBSIELLA PNEUMONIAE Confirmed Extended Spectrum Beta-Lactamase Producer (ESBL).  In bloodstream infections from ESBL organisms, carbapenems are preferred over piperacillin /tazobactam. They are shown to have a lower risk of mortality. Two isolates with different morphologies were identified as the same organism.The most resistant organism was reported. FEW PREVOTELLA BIVIA BETA LACTAMASE POSITIVE FEW ACTINOMYCES SPECIES Standardized susceptibility testing for this organism is not available. Performed at [facility], [address] [place], [place] [address]    Report Status 06/23/2024 FINAL  Final   Organism ID, Bacteria KLEBSIELLA PNEUMONIAE  Final      Susceptibility   Klebsiella pneumoniae - MIC*    AMPICILLIN  >=32 RESISTANT Resistant     CEFEPIME  <=0.12 SENSITIVE Sensitive     CEFTAZIDIME RESISTANT Resistant     CEFTRIAXONE  <=0.25 SENSITIVE Sensitive     CIPROFLOXACIN  <=0.25 SENSITIVE Sensitive     GENTAMICIN <=1 SENSITIVE Sensitive     IMIPENEM 1 SENSITIVE Sensitive     TRIMETH /SULFA  <=20 SENSITIVE Sensitive     AMPICILLIN /SULBACTAM >=32 RESISTANT Resistant     PIP/TAZO <=4 SENSITIVE Sensitive ug/mL    * RARE KLEBSIELLA PNEUMONIAE  Aerobic/Anaerobic Culture w Gram Stain (surgical/deep wound)     Status: None   Collection Time: 06/17/24 12:13 PM   Specimen: Wound; Abscess  Result Value Ref Range Status   Specimen Description   Final    WOUND Performed at [facility], [address] [place], [place] [address]    Special Requests   Final     SCROTAL ABSCESS Performed at [facility], [address] [place], [place] [address]    Gram Stain   Final    ABUNDANT WBC PRESENT, PREDOMINANTLY PMN MODERATE GRAM POSITIVE COCCI FEW GRAM NEGATIVE RODS RARE GRAM POSITIVE RODS    Culture   Final    RARE KLEBSIELLA PNEUMONIAE MODERATE PREVOTELLA BIVIA BETA LACTAMASE POSITIVE ACTINOMYCES SPECIES Standardized susceptibility testing for  this organism is not available. Performed at [facility], [address], [place], [place] [address]    Report Status 06/22/2024 FINAL  Final   Organism ID, Bacteria KLEBSIELLA PNEUMONIAE  Final      Susceptibility   Klebsiella pneumoniae - MIC*    AMPICILLIN  >=32 RESISTANT Resistant     CEFEPIME  <=0.12 SENSITIVE Sensitive     CEFTAZIDIME <=1 SENSITIVE Sensitive     CEFTRIAXONE  <=0.25 SENSITIVE Sensitive     CIPROFLOXACIN  <=0.25 SENSITIVE Sensitive     GENTAMICIN <=1 SENSITIVE Sensitive     IMIPENEM <=0.25 SENSITIVE Sensitive     TRIMETH /SULFA  <=20 SENSITIVE Sensitive  AMPICILLIN /SULBACTAM 4 SENSITIVE Sensitive     PIP/TAZO <=4 SENSITIVE Sensitive ug/mL    * RARE KLEBSIELLA PNEUMONIAE  Resp panel by RT-PCR (RSV, Flu A&B, Covid) Anterior Nasal Swab     Status: None   Collection Time: 06/24/24 10:21 AM   Specimen: Anterior Nasal Swab  Result Value Ref Range Status   SARS Coronavirus 2 by RT PCR NEGATIVE NEGATIVE Final    Comment: (NOTE) SARS-CoV-2 target nucleic acids are NOT DETECTED.  The SARS-CoV-2 RNA is generally detectable in upper respiratory specimens during the acute phase of infection. The lowest concentration of SARS-CoV-2 viral copies this assay can detect is 138 copies/mL. A negative result does not preclude SARS-Cov-2 infection and should not be used as the sole basis for treatment or other patient management decisions. A negative result may occur with  improper specimen collection/handling, submission of specimen other than nasopharyngeal swab, presence of viral mutation(s) within the areas targeted by this assay, and inadequate number of viral copies(<138 copies/mL). A negative result must be combined with clinical observations, patient history, and epidemiological information. The expected result is Negative.  Fact Sheet for Patients:  BloggerCourse.com  Fact Sheet for Healthcare Providers:   SeriousBroker.it  This test is no t yet approved or cleared by the [place]  FDA and  has been authorized for detection and/or diagnosis of SARS-CoV-2 by FDA under an Emergency Use Authorization (EUA). This EUA will remain  in effect (meaning this test can be used) for the duration of the COVID-19 declaration under Section 564(b)(1) of the Act, 21 U.S.C.section 360bbb-3(b)(1), unless the authorization is terminated  or revoked sooner.       Influenza A by PCR NEGATIVE NEGATIVE Final   Influenza B by PCR NEGATIVE NEGATIVE Final    Comment: (NOTE) The Xpert Xpress SARS-CoV-2/FLU/RSV plus assay is intended as an aid in the diagnosis of influenza from Nasopharyngeal swab specimens and should not be used as a sole basis for treatment. Nasal washings and aspirates are unacceptable for Xpert Xpress SARS-CoV-2/FLU/RSV testing.  Fact Sheet for Patients: BloggerCourse.com  Fact Sheet for Healthcare Providers: SeriousBroker.it  This test is not yet approved or cleared by the [place]  FDA and has been authorized for detection and/or diagnosis of SARS-CoV-2 by FDA under an Emergency Use Authorization (EUA). This EUA will remain in effect (meaning this test can be used) for the duration of the COVID-19 declaration under Section 564(b)(1) of the Act, 21 U.S.C. section 360bbb-3(b)(1), unless the authorization is terminated or revoked.     Resp Syncytial Virus by PCR NEGATIVE NEGATIVE Final    Comment: (NOTE) Fact Sheet for Patients: BloggerCourse.com  Fact Sheet for Healthcare Providers: SeriousBroker.it  This test is not yet approved or cleared by the [place]  FDA and has been authorized for detection and/or diagnosis of SARS-CoV-2 by FDA under an Emergency Use Authorization (EUA). This EUA will remain in effect (meaning this test can be used) for  the duration of the COVID-19 declaration under Section 564(b)(1) of the Act, 21 U.S.C. section 360bbb-3(b)(1), unless the authorization is terminated or revoked.  Performed at [facility], [address] [place], [place] [address]     Coagulation Studies: No results for input(s): LABPROT, INR in the last 72 hours.  Urinalysis: No results for input(s): COLORURINE, LABSPEC, PHURINE, GLUCOSEU, HGBUR, BILIRUBINUR, KETONESUR, PROTEINUR, UROBILINOGEN, NITRITE, LEUKOCYTESUR in the last 72 hours.  Invalid input(s): APPERANCEUR    Imaging: No results found.   Medications:    meropenem  (MERREM ) IV 1 g (06/24/24  9164)    amLODipine   10 mg Oral Daily   ascorbic acid   500 mg Oral Daily   atorvastatin   40 mg Oral Daily   Chlorhexidine  Gluconate Cloth  6 each Topical Daily   cyanocobalamin   1,000 mcg Oral Daily   enoxaparin  (LOVENOX ) injection  40 mg Subcutaneous Q24H   famotidine   10 mg Oral BID   feeding supplement (GLUCERNA SHAKE)  237 mL Oral TID BM   fenofibrate   54 mg Oral Daily   ferrous sulfate   325 mg Oral Q breakfast   hydrALAZINE   50 mg Oral Q8H   insulin  aspart  8 Units Subcutaneous TID WC   insulin  glargine-yfgn  40 Units Subcutaneous QHS   pantoprazole   40 mg Oral Daily   pregabalin   75 mg Oral BID   sertraline   100 mg Oral Daily   traZODone   100 mg Oral QHS   acetaminophen , HYDROmorphone  (DILAUDID ) injection, HYDROmorphone , hydrOXYzine , loperamide  HCl, ondansetron  (ZOFRAN ) IV, mouth rinse  Assessment/ Plan:  [name] is a 49 y.o.  male with past medical conditions including PVD on Eliquis , type 1 diabetes with insulin  pump, hypertension, hyperlipidemia, left AKA, CKD stage III B,, who was admitted to [facility] on 06/13/2024 for Cellulitis of perineum [L03.315] Cellulitis of scrotum [N49.2]     Acute kidney injury on chronic kidney disease stage IIIb.  Baseline creatinine appears to be 2.09 with GFR 39 on 02/05/2024.   Acute kidney injury likely secondary to infection.  Creatinine 3.16 on admission.  CT pelvis negative for obstruction.  No IV contrast exposure.  No acute indication for dialysis.  Renal function has improved today.  Patient encouraged to maintain oral intake and appropriate hydration.  Patient will need to follow-up with his nephrologist at discharge. Lab Results  Component Value Date   CREATININE 2.30 (H) 06/24/2024   CREATININE 2.66 (H) 06/23/2024   CREATININE 2.12 (H) 06/21/2024     Intake/Output Summary (Last 24 hours) at 06/24/2024 1346 Last data filed at 06/24/2024 0900 Gross per 24 hour  Intake 440 ml  Output 625 ml  Net -185 ml      2. Anemia of chronic kidney disease Hemoglobin 8.9 Will continue to monitor   3. Acute metabolic acidosis, likely secondary to kidney injury. Resolved.    4.  Insulin -dependent diabetes with chronic kidney disease/renal manifestations:  Home regimen includes NovoLog  and Lantus . Most recent hemoglobin A1c is 9.2 on 05/25/2024.             Well-controlled.  Insulin  managed by primary team.  5.  Sepsis due to cellulitis of scrotum.  Remains on meropenem  twice daily.  Dose adjusted by pharmacy.   LOS: 11 Elmarie Devlin 8/6/20251:46 PM

## 2024-06-24 NOTE — Progress Notes (Signed)
 Physical Therapy Treatment Patient Details Name: Richard Mendoza MRN: [id number] DOB: 1976/09/27 Today's Date: 06/24/2024   History of Present Illness Pt is a 48 yo Male presenting to hospital with scrotum abscess, s/p I&D on 06/17/24. PMH significant for Left AKA 01/29/24 (s/p BKA in the past), DM on insulin  pump, HTN, ACDF 2018, L RCR, R shoulder arthroscopy with debridement and biceps tendon repair;    PT Comments  Pt received in bed agreeable to PT session which focused on strengthening in side lying and standing. Pt educated on proper positioning of L AKA to prevent contractures. Standing balance challenges with and without RW. Pt remains weak throughout due to prolonged hospital stay. He remains very motivated to attain independent function prior to returning home. Currently awaiting on insurance so he can acquire L LE prosthesis. Initial recs for STR remain appropriate.    If plan is discharge home, recommend the following: A little help with walking and/or transfers;A little help with bathing/dressing/bathroom;Assistance with cooking/housework;Assist for transportation;Help with stairs or ramp for entrance   Can travel by private vehicle     Yes  Equipment Recommendations  None recommended by PT (TBD at next level of care)    Recommendations for Other Services       Precautions / Restrictions Precautions Precautions: Other (comment) (L AKA) Recall of Precautions/Restrictions: Impaired Precaution/Restrictions Comments:  (Pt educated on not placing pillows under L AKA to facilitate hip extension) Restrictions Weight Bearing Restrictions Per Provider Order: No     Mobility  Bed Mobility Overal bed mobility: Modified Independent                  Transfers Overall transfer level: Needs assistance Equipment used: Rolling walker (2 wheels) Transfers: Sit to/from Stand Sit to Stand: Contact guard assist           General transfer comment: MinA from recliner to  power up and stand at RW    Ambulation/Gait Ambulation/Gait assistance: Min assist Gait Distance (Feet):  (5) Assistive device: Rolling walker (2 wheels) Gait Pattern/deviations: Step-to pattern, Trunk flexed Gait velocity: decr     General Gait Details: Short distance gait in room with RW, hop to, MinA for turns and stepping backward to prevent LOB   Stairs             Wheelchair Mobility     Tilt Bed    Modified Rankin (Stroke Patients Only)       Balance Overall balance assessment: Needs assistance Sitting-balance support: Feet supported Sitting balance-Leahy Scale: Good     Standing balance support: Reliant on assistive device for balance, During functional activity, Bilateral upper extremity supported Standing balance-Leahy Scale: Fair Standing balance comment: L AKA without prosthesis, remains a high fall risk             High level balance activites: Backward walking, Head turns (Static unsupported standing)              Communication Communication Communication: No apparent difficulties  Cognition Arousal: Alert Behavior During Therapy: WFL for tasks assessed/performed   PT - Cognitive impairments: No apparent impairments                         Following commands: Intact      Cueing Cueing Techniques: Verbal cues  Exercises Amputee Exercises Hip Extension: AROM, Left, 10 reps, Sidelying, Standing Hip ABduction/ADduction: AROM, Left, 10 reps, Sidelying, Standing Hip Flexion/Marching: AROM, Left, 10 reps, Standing Chair Push Up: AROM,  10 reps, Seated Other Exercises Other Exercises: Pt educated re: PT role/POC, DC recommendations, safety with functional mobility, importance of balance interventions when using prosthesis, continued amb to/from bathroom for toileting, OOB to recliner as much as possible, appropriate positioning of L residual limb to prevent contractures for optimal use of prosthesis, LLE strengthening. He  verbalized understanding.    General Comments General comments (skin integrity, edema, etc.):  (Ongoing woind care to scrotum abcess)      Pertinent Vitals/Pain Pain Assessment Pain Assessment: Faces Faces Pain Scale: Hurts little more Pain Location: scrotum Pain Descriptors / Indicators: Aching, Sore Pain Intervention(s): Limited activity within patient's tolerance    Home Living                          Prior Function            PT Goals (current goals can now be found in the care plan section) Acute Rehab PT Goals Patient Stated Goal: to get stronger Progress towards PT goals: Progressing toward goals    Frequency    Min 2X/week      PT Plan      Co-evaluation              AM-PAC PT 6 Clicks Mobility   Outcome Measure  Help needed turning from your back to your side while in a flat bed without using bedrails?: None Help needed moving from lying on your back to sitting on the side of a flat bed without using bedrails?: None Help needed moving to and from a bed to a chair (including a wheelchair)?: A Little Help needed standing up from a chair using your arms (e.g., wheelchair or bedside chair)?: A Little Help needed to walk in hospital room?: A Little Help needed climbing 3-5 steps with a railing? : A Lot 6 Click Score: 19    End of Session Equipment Utilized During Treatment: Gait belt Activity Tolerance: Patient tolerated treatment well;No increased pain Patient left: in bed;with call bell/phone within reach Nurse Communication: Mobility status PT Visit Diagnosis: Other abnormalities of gait and mobility (R26.89);Muscle weakness (generalized) (M62.81);Pain Pain - part of body: Shoulder (Scrotum)     Time: 8799-8765 PT Time Calculation (min) (ACUTE ONLY): 34 min  Charges:    $Therapeutic Exercise: 8-22 mins $Therapeutic Activity: 8-22 mins PT General Charges $$ ACUTE PT VISIT: 1 Visit                    Darice Bohr,  PTA  Darice JAYSON Bohr 06/24/2024, 2:36 PM

## 2024-06-24 NOTE — Plan of Care (Signed)

## 2024-06-24 NOTE — Plan of Care (Signed)
 Problem: Education: Goal: Ability to describe self-care measures that may prevent or decrease complications (Diabetes Survival Skills Education) will improve 06/24/2024 1904 by Theophilus Leverne KIDD, RN Outcome: Progressing 06/24/2024 1904 by Theophilus Leverne KIDD, RN Outcome: Progressing Goal: Individualized Educational Video(s) 06/24/2024 1904 by Theophilus Leverne KIDD, RN Outcome: Progressing 06/24/2024 1904 by Theophilus Leverne KIDD, RN Outcome: Progressing   Problem: Coping: Goal: Ability to adjust to condition or change in health will improve 06/24/2024 1904 by Theophilus Leverne KIDD, RN Outcome: Progressing 06/24/2024 1904 by Theophilus Leverne KIDD, RN Outcome: Progressing   Problem: Fluid Volume: Goal: Ability to maintain a balanced intake and output will improve 06/24/2024 1904 by Theophilus Leverne KIDD, RN Outcome: Progressing 06/24/2024 1904 by Theophilus Leverne KIDD, RN Outcome: Progressing   Problem: Health Behavior/Discharge Planning: Goal: Ability to identify and utilize available resources and services will improve 06/24/2024 1904 by Theophilus Leverne KIDD, RN Outcome: Progressing 06/24/2024 1904 by Theophilus Leverne KIDD, RN Outcome: Progressing Goal: Ability to manage health-related needs will improve 06/24/2024 1904 by Theophilus Leverne KIDD, RN Outcome: Progressing 06/24/2024 1904 by Theophilus Leverne KIDD, RN Outcome: Progressing   Problem: Metabolic: Goal: Ability to maintain appropriate glucose levels will improve 06/24/2024 1904 by Theophilus Leverne KIDD, RN Outcome: Progressing 06/24/2024 1904 by Theophilus Leverne KIDD, RN Outcome: Progressing   Problem: Nutritional: Goal: Maintenance of adequate nutrition will improve 06/24/2024 1904 by Theophilus Leverne KIDD, RN Outcome: Progressing 06/24/2024 1904 by Theophilus Leverne KIDD, RN Outcome: Progressing Goal: Progress toward achieving an optimal weight will improve 06/24/2024 1904 by Theophilus Leverne KIDD, RN Outcome: Progressing 06/24/2024 1904 by  Theophilus Leverne KIDD, RN Outcome: Progressing   Problem: Skin Integrity: Goal: Risk for impaired skin integrity will decrease 06/24/2024 1904 by Theophilus Leverne KIDD, RN Outcome: Progressing 06/24/2024 1904 by Theophilus Leverne KIDD, RN Outcome: Progressing   Problem: Tissue Perfusion: Goal: Adequacy of tissue perfusion will improve 06/24/2024 1904 by Theophilus Leverne KIDD, RN Outcome: Progressing 06/24/2024 1904 by Theophilus Leverne KIDD, RN Outcome: Progressing   Problem: Education: Goal: Knowledge of General Education information will improve Description: Including pain rating scale, medication(s)/side effects and non-pharmacologic comfort measures 06/24/2024 1904 by Theophilus Leverne KIDD, RN Outcome: Progressing 06/24/2024 1904 by Theophilus Leverne KIDD, RN Outcome: Progressing   Problem: Health Behavior/Discharge Planning: Goal: Ability to manage health-related needs will improve 06/24/2024 1904 by Theophilus Leverne KIDD, RN Outcome: Progressing 06/24/2024 1904 by Theophilus Leverne KIDD, RN Outcome: Progressing   Problem: Clinical Measurements: Goal: Ability to maintain clinical measurements within normal limits will improve 06/24/2024 1904 by Theophilus Leverne KIDD, RN Outcome: Progressing 06/24/2024 1904 by Theophilus Leverne KIDD, RN Outcome: Progressing Goal: Will remain free from infection 06/24/2024 1904 by Theophilus Leverne KIDD, RN Outcome: Progressing 06/24/2024 1904 by Theophilus Leverne KIDD, RN Outcome: Progressing Goal: Diagnostic test results will improve 06/24/2024 1904 by Theophilus Leverne KIDD, RN Outcome: Progressing 06/24/2024 1904 by Theophilus Leverne KIDD, RN Outcome: Progressing Goal: Respiratory complications will improve 06/24/2024 1904 by Theophilus Leverne KIDD, RN Outcome: Progressing 06/24/2024 1904 by Theophilus Leverne KIDD, RN Outcome: Progressing Goal: Cardiovascular complication will be avoided 06/24/2024 1904 by Theophilus Leverne KIDD, RN Outcome: Progressing 06/24/2024 1904 by Theophilus Leverne KIDD, RN Outcome: Progressing   Problem: Activity: Goal: Risk for activity intolerance will decrease 06/24/2024 1904 by Theophilus Leverne KIDD, RN Outcome: Progressing 06/24/2024 1904 by Theophilus Leverne KIDD, RN Outcome: Progressing   Problem: Nutrition: Goal: Adequate nutrition will be maintained 06/24/2024 1904 by Theophilus Leverne KIDD, RN Outcome: Progressing 06/24/2024 1904 by Theophilus Leverne KIDD, RN Outcome: Progressing  Problem: Coping: Goal: Level of anxiety will decrease 06/24/2024 1904 by Theophilus Leverne KIDD, RN Outcome: Progressing 06/24/2024 1904 by Theophilus Leverne KIDD, RN Outcome: Progressing   Problem: Elimination: Goal: Will not experience complications related to bowel motility 06/24/2024 1904 by Theophilus Leverne KIDD, RN Outcome: Progressing 06/24/2024 1904 by Theophilus Leverne KIDD, RN Outcome: Progressing Goal: Will not experience complications related to urinary retention 06/24/2024 1904 by Theophilus Leverne KIDD, RN Outcome: Progressing 06/24/2024 1904 by Theophilus Leverne KIDD, RN Outcome: Progressing   Problem: Pain Managment: Goal: General experience of comfort will improve and/or be controlled 06/24/2024 1904 by Theophilus Leverne KIDD, RN Outcome: Progressing 06/24/2024 1904 by Theophilus Leverne KIDD, RN Outcome: Progressing   Problem: Safety: Goal: Ability to remain free from injury will improve 06/24/2024 1904 by Theophilus Leverne KIDD, RN Outcome: Progressing 06/24/2024 1904 by Theophilus Leverne KIDD, RN Outcome: Progressing   Problem: Skin Integrity: Goal: Risk for impaired skin integrity will decrease 06/24/2024 1904 by Theophilus Leverne KIDD, RN Outcome: Progressing 06/24/2024 1904 by Theophilus Leverne KIDD, RN Outcome: Progressing   Problem: Clinical Measurements: Goal: Ability to avoid or minimize complications of infection will improve 06/24/2024 1904 by Theophilus Leverne KIDD, RN Outcome: Progressing 06/24/2024 1904 by Theophilus Leverne KIDD, RN Outcome: Progressing    Problem: Skin Integrity: Goal: Skin integrity will improve 06/24/2024 1904 by Theophilus Leverne KIDD, RN Outcome: Progressing 06/24/2024 1904 by Theophilus Leverne KIDD, RN Outcome: Progressing

## 2024-06-24 NOTE — Hospital Course (Addendum)
 48 y.o. male with medical history significant of status post left AKA, PVD on Eliquis , DM-type I on insulin  pump, hypertension, chronic pain syndrome, CKD-3a, HLD, tobacco abuse, who presents with scrotal pain and fever.  CT scan showed scrotal abscess.  Urology has performed I&D on 7/30.  Culture came back with ESBL Klebsiella.  Patient is seen by ID, started on meropenem . Condition has improved, pending nursing home placement.

## 2024-06-24 NOTE — TOC Progression Note (Addendum)
 Transition of Care [facility]) - Progression Note    Patient Details  Name: Richard Mendoza MRN: [id number] Date of Birth: [birth year]  Transition [facility] Care [facility]) CM/SW Contact  Lauraine [name], LCSW Phone Number: 06/24/2024, 11:26 AM  Clinical Narrative:   SNF insurance authorization is still pending.  3:59 pm: [facility] is approved. [facility] can accept patient tomorrow. Patient is aware. Mom is still planning to transport.  Expected Discharge Plan: Skilled Nursing Facility Barriers to Discharge: Continued Medical Work up               Expected Discharge Plan and Services       Living arrangements for the past 2 months: Single Family Home                                       Social Drivers of Health (SDOH) Interventions SDOH Screenings   Food Insecurity: No Food Insecurity (06/14/2024)  Housing: High Risk (06/22/2024)  Transportation Needs: Unmet Transportation Needs (06/14/2024)  Utilities: Not At Risk (06/14/2024)  Depression (PHQ2-9): High Risk (05/19/2024)  Financial Resource Strain: Low Risk  (07/24/2022)   Received from [facility]  Social Connections: Socially Isolated (06/14/2024)  Stress: No Stress Concern Present (07/24/2022)   Received from [facility]  Tobacco Use: Medium Risk (06/13/2024)    Readmission Risk Interventions    01/24/2024   12:35 PM 08/21/2023    9:03 AM  Readmission Risk Prevention Plan  Transportation Screening Complete Complete  PCP or Specialist Appt within 3-5 Days Complete Complete  HRI or Home Care Consult Complete   Social Work Consult for Recovery Care Planning/Counseling Complete Complete  Palliative Care Screening Not Applicable Not Applicable  Medication Review Oceanographer) Complete Complete

## 2024-06-24 NOTE — Plan of Care (Signed)
   Problem: Metabolic: Goal: Ability to maintain appropriate glucose levels will improve Outcome: Progressing

## 2024-06-25 DIAGNOSIS — R6520 Severe sepsis without septic shock: Secondary | ICD-10-CM

## 2024-06-25 DIAGNOSIS — A419 Sepsis, unspecified organism: Secondary | ICD-10-CM | POA: Diagnosis not present

## 2024-06-25 DIAGNOSIS — N492 Inflammatory disorders of scrotum: Secondary | ICD-10-CM | POA: Diagnosis not present

## 2024-06-25 DIAGNOSIS — I739 Peripheral vascular disease, unspecified: Secondary | ICD-10-CM | POA: Diagnosis not present

## 2024-06-25 DIAGNOSIS — N179 Acute kidney failure, unspecified: Secondary | ICD-10-CM | POA: Diagnosis not present

## 2024-06-25 LAB — GLUCOSE, CAPILLARY: Glucose-Capillary: 102 mg/dL — ABNORMAL HIGH (ref 70–99)

## 2024-06-25 MED ORDER — PREGABALIN 75 MG PO CAPS: 75.0000 mg | ORAL_CAPSULE | Freq: Three times a day (TID) | ORAL | 0 refills | Status: DC

## 2024-06-25 MED ORDER — BASAGLAR KWIKPEN 100 UNIT/ML ~~LOC~~ SOPN: 40.0000 [IU] | PEN_INJECTOR | Freq: Every day | SUBCUTANEOUS | Status: DC

## 2024-06-25 MED ORDER — AMOXICILLIN-POT CLAVULANATE 875-125 MG PO TABS: 1.0000 | ORAL_TABLET | Freq: Two times a day (BID) | ORAL | Status: AC

## 2024-06-25 MED ORDER — CIPROFLOXACIN HCL 500 MG PO TABS: 500.0000 mg | ORAL_TABLET | Freq: Two times a day (BID) | ORAL | Status: AC

## 2024-06-25 MED ORDER — AMLODIPINE BESYLATE 10 MG PO TABS: 10.0000 mg | ORAL_TABLET | Freq: Every day | ORAL | Status: DC

## 2024-06-25 MED ORDER — HYDRALAZINE HCL 50 MG PO TABS: 50.0000 mg | ORAL_TABLET | Freq: Three times a day (TID) | ORAL | Status: DC

## 2024-06-25 MED ORDER — HYDROMORPHONE HCL 2 MG PO TABS: 1.0000 mg | ORAL_TABLET | ORAL | 0 refills | Status: DC | PRN

## 2024-06-25 NOTE — Progress Notes (Signed)
 Called report to facility. Pt also given dc/rx instructions. Facility nurse and pt voice understanding. Awaiting pt's mother to give him a ride to facility.
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
## 2024-06-25 NOTE — Discharge Summary (Signed)
 Physician Discharge Summary   Patient: Richard Mendoza MRN: [id number] DOB: 1976-02-25  Admit date:     06/13/2024  Discharge date: 06/25/24  Discharge Physician: Murvin Mana   PCP: Carles Seltzer, MD   Recommendations at discharge:   Follow-up with PCP in 1 week. Follow-up with urology in 2 weeks. Follow-up with vascular surgery which was scheduled in November. Follow-up with your neurologist as previous scheduled  Discharge Diagnoses: Principal Problem:   Cellulitis of scrotum Active Problems:   Sepsis (HCC)   Essential hypertension   PAD (peripheral artery disease) (HCC)   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)   Chronic diastolic CHF (congestive heart failure) (HCC)   HLD (hyperlipidemia)   Acute renal failure superimposed on stage 3b chronic kidney disease (HCC)   Iron  deficiency anemia, unspecified   Chronic pain syndrome   Depression   Overweight (BMI 25.0-29.9)   Cellulitis of perineum   Scrotal abscess   Anemia of chronic kidney failure, stage 3 (moderate) (HCC)  Resolved Problems:   * No resolved hospital problems. *  Hospital Course: 48 y.o. male with medical history significant of status post left AKA, PVD on Eliquis , DM-type I on insulin  pump, hypertension, chronic pain syndrome, CKD-3a, HLD, tobacco abuse, who presents with scrotal pain and fever.  CT scan showed scrotal abscess.  Urology has performed I&D on 7/30.  Culture came back with ESBL Klebsiella.  Patient is seen by ID, started on meropenem . Condition has improved, pending nursing home placement. Per ID, antibiotic has switched to oral, patient will need dressing changes twice a day, which will be performed in the nursing home  Assessment and Plan: Severe sepsis secondary to scrotal abscess Scrotum cellulitis with abscess.  Secondary to ESBL Klebsiella. Reviewed the chart, patient met sepsis criteria with significant fever, leukocytosis and tachycardia and a lactic acidosis.  He  also has significant organ failure with elevated lactic acid and acute renal failure.  He met severe sepsis criteria. This is due to scrotal abscess.  He is status post I&D, currently on meropenem .  Planning to discharge with oral antibiotics Cipro  and Augmentin  for 2 weeks. Condition improved, medically stable for discharge.   Acute kidney injury on chronic kidney stage IIIb. This is secondary to severe sepsis.  Condition had improved, renal function seem to be back to baseline.  Patient followed by nephrology.   Essential hypertension. Continue home medicines.   Acute urinary retention secondary to scrotal abscess. Condition has resolved.   Uncontrolled type 1 diabetes with hyperglycemia A1c 9.2, resume home treatment, follow-up with PCP to adjust the dose.   Chronic diastolic congestive heart failure. Appears to be euvolemic, no exacerbation.   Peripheral arterial disease. Status post left AKA. Patient was placed on Eliquis  in March after AKA.  He was still taking it before admission, I will resume it at discharge.  Follow-up with vascular surgery as scheduled.   Chronic pain syndrome. Continue as needed pain medicine.          Consultants: Urology Procedures performed: I&D  Disposition: Skilled nursing facility Diet recommendation:  Discharge Diet Orders (From admission, onward)     Start     Ordered   06/25/24 0000  Diet - low sodium heart healthy        06/25/24 0840           Cardiac diet DISCHARGE MEDICATION: Allergies as of 06/25/2024       Reactions   Oxycodone  Itching   Codeine Itching  Medication List     STOP taking these medications    benazepril  40 MG tablet Commonly known as: LOTENSIN    busPIRone  10 MG tablet Commonly known as: BUSPAR    furosemide  40 MG tablet Commonly known as: LASIX    metoCLOPramide  10 MG tablet Commonly known as: REGLAN        TAKE these medications    acetaminophen  500 MG tablet Commonly known as:  TYLENOL  Take 1 tablet (500 mg total) by mouth 3 (three) times daily.   amLODipine  10 MG tablet Commonly known as: NORVASC  Take 1 tablet (10 mg total) by mouth daily.   amoxicillin -clavulanate 875-125 MG tablet Commonly known as: AUGMENTIN  Take 1 tablet by mouth every 12 (twelve) hours for 6 days.   ascorbic acid  500 MG tablet Commonly known as: VITAMIN C  Take 1 tablet (500 mg total) by mouth daily.   atorvastatin  40 MG tablet Commonly known as: LIPITOR Take 40 mg by mouth daily.   Basaglar  KwikPen 100 UNIT/ML Inject 40 Units into the skin at bedtime. What changed:  how much to take when to take this   ciprofloxacin  500 MG tablet Commonly known as: CIPRO  Take 1 tablet (500 mg total) by mouth 2 (two) times daily for 6 days.   cyanocobalamin  1000 MCG tablet Commonly known as: VITAMIN B12 Take 1,000 mcg by mouth daily.   Dexcom G7 Sensor Misc Inject 1 Application into the skin as directed. Change sensor every 10 days as directed.   Eliquis  5 MG Tabs tablet Generic drug: apixaban  Take 5 mg by mouth 2 (two) times daily.   famotidine  10 MG tablet Commonly known as: PEPCID  Take 10 mg by mouth 2 (two) times daily.   fenofibrate  54 MG tablet Take 1 tablet (54 mg total) by mouth daily.   ferrous sulfate  325 (65 FE) MG tablet Take 325 mg by mouth daily with breakfast.   hydrALAZINE  50 MG tablet Commonly known as: APRESOLINE  Take 1 tablet (50 mg total) by mouth every 8 (eight) hours.   HYDROmorphone  2 MG tablet Commonly known as: DILAUDID  Take 0.5-1 tablets (1-2 mg total) by mouth every 4 (four) hours as needed for severe pain (pain score 7-10).   hydrOXYzine  25 MG tablet Commonly known as: ATARAX  Take 25 mg by mouth every 8 (eight) hours.   insulin  aspart 100 UNIT/ML injection Commonly known as: novoLOG  Inject 10-16 Units into the skin 3 (three) times daily with meals. 90-150= 10 units; 151-200= 11 units; 201-250= 12 units; 251-300= 13 units; 301-350= 14 units;  351-400= 15 units; 401 or higher = 16 units   INSULIN  SYRINGE .5CC/29G 29G X 1/2 0.5 ML Misc Use to inject insulin  4 times daily   methocarbamol  500 MG tablet Commonly known as: ROBAXIN  Take 1 tablet (500 mg total) by mouth every 8 (eight) hours as needed for muscle spasms.   ondansetron  8 MG tablet Commonly known as: ZOFRAN  Take 8 mg by mouth every 8 (eight) hours as needed for nausea or vomiting.   pantoprazole  40 MG tablet Commonly known as: PROTONIX  Take 40 mg by mouth daily.   pregabalin  75 MG capsule Commonly known as: LYRICA  Take 75 mg by mouth 3 (three) times daily.   sertraline  100 MG tablet Commonly known as: ZOLOFT  Take 100 mg by mouth daily.   traZODone  100 MG tablet Commonly known as: DESYREL  Take 100 mg by mouth at bedtime.   Vitamin D  (Ergocalciferol ) 1.25 MG (50000 UNIT) Caps capsule Commonly known as: DRISDOL  Take 1 capsule (50,000 Units total) by mouth  every 7 (seven) days.               Discharge Care Instructions  (From admission, onward)           Start     Ordered   06/25/24 0000  Discharge wound care:       Comments: Dressing change bid,  Wound should be packed tightly with damp with 2 inch Kerlix, reinforced with ABD pads and mesh pants   06/25/24 0840            Contact information for follow-up providers     Carles Seltzer, MD Follow up in 1 week(s).   Specialty: Family Medicine Contact information: [address] [place] [address] [phone]         Francisca Redell BROCKS, MD Follow up in 2 week(s).   Specialty: Urology Contact information: [address] [place] [address] [phone]         Marea Selinda RAMAN, MD Follow up.   Specialties: Vascular Surgery, Radiology, Interventional Cardiology Contact information: [address] [place] [address] [phone]              Contact information for after-discharge care     Destination     [facility] .   Service: Skilled Nursing Contact information: [address] [place]  [phone]                    Discharge Exam: Fredricka Weights   06/23/24 0500 06/24/24 0500 06/25/24 0500  Weight: 88.7 kg 90.1 kg 89.3 kg   General exam: Appears calm and comfortable  Respiratory system: Clear to auscultation. Respiratory effort normal. Cardiovascular system: S1 & S2 heard, RRR. No JVD, murmurs, rubs, gallops or clicks. No pedal edema. Gastrointestinal system: Abdomen is nondistended, soft and nontender. No organomegaly or masses felt. Normal bowel sounds heard. Central nervous system: Alert and oriented. No focal neurological deficits. Extremities: Left AKA. Skin: No rashes, lesions or ulcers Psychiatry: Judgement and insight appear normal. Mood & affect appropriate.    Condition at discharge: good  The results of significant diagnostics from this hospitalization (including imaging, microbiology, ancillary and laboratory) are listed below for reference.   Imaging Studies: US  RENAL Result Date: 06/16/2024 CLINICAL DATA:  Acute kidney injury EXAM: RENAL / URINARY TRACT ULTRASOUND COMPLETE COMPARISON:  None Available. FINDINGS: Right Kidney: Renal measurements: 12.6 x 5.9 x 6.8 cm = volume: 264 mL. Echogenicity within normal limits. No mass or hydronephrosis visualized. Left Kidney: Renal measurements: 12.9 x 6.8 x 5.9 cm = volume: 271 mL. Echogenicity within normal limits. There is a simple cyst in the upper pole measuring 5 x 4 x 6 mm. No mass or hydronephrosis visualized. Bladder: Decompressed by Foley catheter. Other: None. IMPRESSION: 1. No hydronephrosis. 2. Small simple cyst in the upper pole of the left kidney. Electronically Signed   By: Greig Pique M.D.   On: 06/16/2024 16:57   CT PELVIS WO CONTRAST Result Date: 06/16/2024 CLINICAL DATA:  Scrotal swelling and edema. EXAM: CT PELVIS WITHOUT  CONTRAST TECHNIQUE: Multidetector CT imaging of the pelvis was performed following the standard protocol without intravenous contrast. RADIATION DOSE REDUCTION: This exam was performed according to the departmental dose-optimization program which includes automated exposure control, adjustment of the mA and/or kV according to patient size and/or use of iterative reconstruction technique. COMPARISON:  CT dated 06/13/2024. FINDINGS: Evaluation of this exam  is limited in the absence of intravenous contrast. Urinary Tract: The urinary bladder is decompressed around a Foley catheter. Bowel:  No dilated bowel loops noted in the pelvis. Vascular/Lymphatic: Mild aortoiliac atherosclerotic disease. Mildly enlarged left external iliac chain lymph node measures 11 mm short axis. Reproductive: The prostate gland is grossly unremarkable. There is diffuse scrotal and perineal swelling and edema, progressed since the prior CT. Faint 3.7 x 1.6 cm ovoid area in the inferior left scrotal wall (64/2) may represent a developing abscess. Ultrasound may provide better evaluation if the patient is able to tolerate. No soft tissue gas. Other:  None Musculoskeletal: Degenerative changes.  No acute osseous pathology. IMPRESSION: 1. Worsened scrotal swelling with findings concerning for developing abscess in the inferior left scrotum. No soft tissue gas. 2.  Aortic Atherosclerosis (ICD10-I70.0). Electronically Signed   By: Vanetta Chou M.D.   On: 06/16/2024 13:11   CT PELVIS WO CONTRAST Result Date: 06/13/2024 CLINICAL DATA:  Soft tissue infection left posterior scrotum or perineum. EXAM: CT PELVIS WITHOUT CONTRAST TECHNIQUE: Multidetector CT imaging of the pelvis was performed following the standard protocol without intravenous contrast. RADIATION DOSE REDUCTION: This exam was performed according to the departmental dose-optimization program which includes automated exposure control, adjustment of the mA and/or kV according to patient  size and/or use of iterative reconstruction technique. COMPARISON:  CT abdomen pelvis dated 05/23/2024. FINDINGS: Evaluation of this exam is limited in the absence of intravenous contrast. Urinary Tract:  No abnormality visualized. Bowel:  Unremarkable visualized pelvic bowel loops. Vascular/Lymphatic: Mild aortoiliac atherosclerotic disease. The IVC is unremarkable. Reproductive: The prostate and seminal vesicles are grossly unremarkable. Other: There is diffuse scrotal wall thickening. There is inflammatory changes and thickening of the subcutaneous fat in the left perineum and along the left perineal fold. No drainable fluid collection or abscess. No soft tissue gas. Musculoskeletal: No suspicious bone lesions identified. IMPRESSION: 1. Diffuse scrotal wall thickening and inflammatory changes in the left perineum. No drainable fluid collection or abscess. No soft tissue gas. 2.  Aortic Atherosclerosis (ICD10-I70.0). Electronically Signed   By: Vanetta Chou M.D.   On: 06/13/2024 17:08    Microbiology: Results for orders placed or performed during the hospital encounter of 06/13/24  Blood Culture (routine x 2)     Status: None   Collection Time: 06/13/24  3:32 PM   Specimen: BLOOD RIGHT ARM  Result Value Ref Range Status   Specimen Description BLOOD RIGHT ARM  Final   Special Requests   Final    BOTTLES DRAWN AEROBIC AND ANAEROBIC Blood Culture results may not be optimal due to an inadequate volume of blood received in culture bottles   Culture   Final    NO GROWTH 5 DAYS Performed at [facility], [address] [place], [place] [address]    Report Status 06/18/2024 FINAL  Final  Blood Culture (routine x 2)     Status: None   Collection Time: 06/13/24  4:37 PM   Specimen: BLOOD  Result Value Ref Range Status   Specimen Description BLOOD BLOOD LEFT FOREARM  Final   Special Requests   Final    BOTTLES DRAWN AEROBIC AND ANAEROBIC Blood Culture results may not be optimal due to an  inadequate volume of blood received in culture bottles   Culture   Final    NO GROWTH 5 DAYS Performed at [facility], [address], [place], [place] [address]    Report Status 06/18/2024 FINAL  Final  Aerobic/Anaerobic Culture  w Gram Stain (surgical/deep wound)     Status: None   Collection Time: 06/17/24 12:12 PM   Specimen: Wound; Tissue  Result Value Ref Range Status   Specimen Description TISSUE SCROTUM  Final   Special Requests  FROM SCROTAL ABSCESS  Final   Gram Stain   Final    NO WBC SEEN FEW GRAM POSITIVE COCCI FEW GRAM POSITIVE RODS RARE GRAM NEGATIVE RODS    Culture   Final    RARE KLEBSIELLA PNEUMONIAE Confirmed Extended Spectrum Beta-Lactamase Producer (ESBL).  In bloodstream infections from ESBL organisms, carbapenems are preferred over piperacillin /tazobactam. They are shown to have a lower risk of mortality. Two isolates with different morphologies were identified as the same organism.The most resistant organism was reported. FEW PREVOTELLA BIVIA BETA LACTAMASE POSITIVE FEW ACTINOMYCES SPECIES Standardized susceptibility testing for this organism is not available. Performed at [facility], [address] [place], [place] [address]    Report Status 06/23/2024 FINAL  Final   Organism ID, Bacteria KLEBSIELLA PNEUMONIAE  Final      Susceptibility   Klebsiella pneumoniae - MIC*    AMPICILLIN  >=32 RESISTANT Resistant     CEFEPIME  <=0.12 SENSITIVE Sensitive     CEFTAZIDIME RESISTANT Resistant     CEFTRIAXONE  <=0.25 SENSITIVE Sensitive     CIPROFLOXACIN  <=0.25 SENSITIVE Sensitive     GENTAMICIN <=1 SENSITIVE Sensitive     IMIPENEM 1 SENSITIVE Sensitive     TRIMETH /SULFA  <=20 SENSITIVE Sensitive     AMPICILLIN /SULBACTAM >=32 RESISTANT Resistant     PIP/TAZO <=4 SENSITIVE Sensitive ug/mL    * RARE KLEBSIELLA PNEUMONIAE  Aerobic/Anaerobic Culture w Gram Stain (surgical/deep wound)     Status: None   Collection Time: 06/17/24 12:13 PM   Specimen:  Wound; Abscess  Result Value Ref Range Status   Specimen Description   Final    WOUND Performed at [facility], [address] [place], [place] [address]    Special Requests   Final     SCROTAL ABSCESS Performed at [facility], [address] [place], [place] [address]    Gram Stain   Final    ABUNDANT WBC PRESENT, PREDOMINANTLY PMN MODERATE GRAM POSITIVE COCCI FEW GRAM NEGATIVE RODS RARE GRAM POSITIVE RODS    Culture   Final    RARE KLEBSIELLA PNEUMONIAE MODERATE PREVOTELLA BIVIA BETA LACTAMASE POSITIVE ACTINOMYCES SPECIES Standardized susceptibility testing for this organism is not available. Performed at [facility], [address] [place], [place] [address]    Report Status 06/22/2024 FINAL  Final   Organism ID, Bacteria KLEBSIELLA PNEUMONIAE  Final      Susceptibility   Klebsiella pneumoniae - MIC*    AMPICILLIN  >=32 RESISTANT Resistant     CEFEPIME  <=0.12 SENSITIVE Sensitive     CEFTAZIDIME <=1 SENSITIVE Sensitive     CEFTRIAXONE  <=0.25 SENSITIVE Sensitive     CIPROFLOXACIN  <=0.25 SENSITIVE Sensitive     GENTAMICIN <=1 SENSITIVE Sensitive     IMIPENEM <=0.25 SENSITIVE Sensitive     TRIMETH /SULFA  <=20 SENSITIVE Sensitive     AMPICILLIN /SULBACTAM 4 SENSITIVE Sensitive     PIP/TAZO <=4 SENSITIVE Sensitive ug/mL    * RARE KLEBSIELLA PNEUMONIAE  Resp panel by RT-PCR (RSV, Flu A&B, Covid) Anterior Nasal Swab     Status: None   Collection Time: 06/24/24 10:21 AM   Specimen: Anterior Nasal Swab  Result Value Ref Range Status   SARS Coronavirus 2 by RT PCR NEGATIVE NEGATIVE Final    Comment: (NOTE) SARS-CoV-2  target nucleic acids are NOT DETECTED.  The SARS-CoV-2 RNA is generally detectable in upper respiratory specimens during the acute phase of infection. The lowest concentration of SARS-CoV-2 viral copies this assay can detect is 138 copies/mL. A negative result does not preclude SARS-Cov-2 infection and should not be used as the sole  basis for treatment or other patient management decisions. A negative result may occur with  improper specimen collection/handling, submission of specimen other than nasopharyngeal swab, presence of viral mutation(s) within the areas targeted by this assay, and inadequate number of viral copies(<138 copies/mL). A negative result must be combined with clinical observations, patient history, and epidemiological information. The expected result is Negative.  Fact Sheet for Patients:  BloggerCourse.com  Fact Sheet for Healthcare Providers:  SeriousBroker.it  This test is no t yet approved or cleared by the [place]  FDA and  has been authorized for detection and/or diagnosis of SARS-CoV-2 by FDA under an Emergency Use Authorization (EUA). This EUA will remain  in effect (meaning this test can be used) for the duration of the COVID-19 declaration under Section 564(b)(1) of the Act, 21 U.S.C.section 360bbb-3(b)(1), unless the authorization is terminated  or revoked sooner.       Influenza A by PCR NEGATIVE NEGATIVE Final   Influenza B by PCR NEGATIVE NEGATIVE Final    Comment: (NOTE) The Xpert Xpress SARS-CoV-2/FLU/RSV plus assay is intended as an aid in the diagnosis of influenza from Nasopharyngeal swab specimens and should not be used as a sole basis for treatment. Nasal washings and aspirates are unacceptable for Xpert Xpress SARS-CoV-2/FLU/RSV testing.  Fact Sheet for Patients: BloggerCourse.com  Fact Sheet for Healthcare Providers: SeriousBroker.it  This test is not yet approved or cleared by the [place]  FDA and has been authorized for detection and/or diagnosis of SARS-CoV-2 by FDA under an Emergency Use Authorization (EUA). This EUA will remain in effect (meaning this test can be used) for the duration of the COVID-19 declaration under Section 564(b)(1) of the Act,  21 U.S.C. section 360bbb-3(b)(1), unless the authorization is terminated or revoked.     Resp Syncytial Virus by PCR NEGATIVE NEGATIVE Final    Comment: (NOTE) Fact Sheet for Patients: BloggerCourse.com  Fact Sheet for Healthcare Providers: SeriousBroker.it  This test is not yet approved or cleared by the [place]  FDA and has been authorized for detection and/or diagnosis of SARS-CoV-2 by FDA under an Emergency Use Authorization (EUA). This EUA will remain in effect (meaning this test can be used) for the duration of the COVID-19 declaration under Section 564(b)(1) of the Act, 21 U.S.C. section 360bbb-3(b)(1), unless the authorization is terminated or revoked.  Performed at [facility], [address] [place], [place] [address]     Labs: CBC: Recent Labs  Lab 06/18/24 [phone] 06/21/24 0633 06/24/24 0516  WBC 11.3* 9.5 9.6  HGB 8.8* 10.2* 8.9*  HCT 27.8* 31.1* 28.0*  MCV 77.4* 76.6* 78.0*  PLT 302 366 350   Basic Metabolic Panel: Recent Labs  Lab 06/18/24 0902 06/19/24 0420 06/21/24 0633 06/23/24 0454 06/24/24 0516  NA 137 139 140 139 140  K 4.3 4.1 3.8 3.6 3.7  CL 106 112* 109 110 111  CO2 20* 20* 22 22 23   GLUCOSE 337* 197* 141* 250* 171*  BUN 35* 34* 22* 33* 31*  CREATININE 2.95* 2.74* 2.12* 2.66* 2.30*  CALCIUM  9.1 8.5* 8.9 8.6* 8.5*  MG  --   --  2.0  --  2.3  PHOS  --   --  2.6 3.1  --    Liver Function Tests: Recent Labs  Lab 06/21/24 0633 06/23/24 0454  ALBUMIN 2.6* 2.4*   CBG: Recent Labs  Lab 06/24/24 0725 06/24/24 1205 06/24/24 1631 06/24/24 2019 06/25/24 0801  GLUCAP 143* 155* 188* 197* 102*    Discharge time spent:35 minutes.  Signed: Murvin Mana, MD [facility] 06/25/2024

## 2024-06-25 NOTE — Progress Notes (Signed)
 [facility]  ROUNDING NOTE   Subjective:  Admitted with scrotal abscess requiring I&d by urology. Foley catheter placed. Followed by ID. Placed on pip/tazo and linazolid.  Now on Linezolid   Patient sitting at side of bed Alert Preparing for discharge  Creatinine 2.30 at last check   Objective:  Vital signs in last 24 hours:  Temp:  [97.9 F (36.6 C)-98.4 F (36.9 C)] 98.2 F (36.8 C) (08/07 1058) Pulse Rate:  [76-95] 88 (08/07 1058) Resp:  [18-20] 18 (08/07 1058) BP: (141-178)/(72-99) 178/91 (08/07 1058) SpO2:  [98 %-100 %] 100 % (08/07 1058) Weight:  [89.3 kg] 89.3 kg (08/07 0500)  Weight change: -0.8 kg Filed Weights   06/23/24 0500 06/24/24 0500 06/25/24 0500  Weight: 88.7 kg 90.1 kg 89.3 kg    Intake/Output: I/O last 3 completed shifts: In: 660 [P.O.:560; IV Piggyback:100] Out: 1250 [Urine:1250]   Intake/Output this shift:  Total I/O In: 0  Out: 300 [Urine:300]  Physical Exam: General: NAD  Head: Normocephalic  Eyes: Anicteric  Lungs:  Clear, room air  Heart: Regular rate   Abdomen:  Soft, nontender  Extremities: No peripheral edema.  Neurologic: Alert and oriented  Skin: No rashes, scrotal dressing  Access: None    Basic Metabolic Panel: Recent Labs  Lab 06/19/24 0420 06/21/24 0633 06/23/24 0454 06/24/24 0516  NA 139 140 139 140  K 4.1 3.8 3.6 3.7  CL 112* 109 110 111  CO2 20* 22 22 23   GLUCOSE 197* 141* 250* 171*  BUN 34* 22* 33* 31*  CREATININE 2.74* 2.12* 2.66* 2.30*  CALCIUM  8.5* 8.9 8.6* 8.5*  MG  --  2.0  --  2.3  PHOS  --  2.6 3.1  --     Liver Function Tests: Recent Labs  Lab 06/21/24 0633 06/23/24 0454  ALBUMIN 2.6* 2.4*   No results for input(s): LIPASE, AMYLASE in the last 168 hours. No results for input(s): AMMONIA in the last 168 hours.  CBC: Recent Labs  Lab 06/21/24 0633 06/24/24 0516  WBC 9.5 9.6  HGB 10.2* 8.9*  HCT 31.1* 28.0*  MCV 76.6* 78.0*  PLT 366 350    Cardiac Enzymes: No  results for input(s): CKTOTAL, CKMB, CKMBINDEX, TROPONINI in the last 168 hours.  BNP: Invalid input(s): POCBNP  CBG: Recent Labs  Lab 06/24/24 0725 06/24/24 1205 06/24/24 1631 06/24/24 2019 06/25/24 0801  GLUCAP 143* 155* 188* 197* 102*    Microbiology: Results for orders placed or performed during the hospital encounter of 06/13/24  Blood Culture (routine x 2)     Status: None   Collection Time: 06/13/24  3:32 PM   Specimen: BLOOD RIGHT ARM  Result Value Ref Range Status   Specimen Description BLOOD RIGHT ARM  Final   Special Requests   Final    BOTTLES DRAWN AEROBIC AND ANAEROBIC Blood Culture results may not be optimal due to an inadequate volume of blood received in culture bottles   Culture   Final    NO GROWTH 5 DAYS Performed at [facility], [address], [place], [place] [address]    Report Status 06/18/2024 FINAL  Final  Blood Culture (routine x 2)     Status: None   Collection Time: 06/13/24  4:37 PM   Specimen: BLOOD  Result Value Ref Range Status   Specimen Description BLOOD BLOOD LEFT FOREARM  Final   Special Requests   Final    BOTTLES DRAWN AEROBIC AND ANAEROBIC Blood Culture results may not be  optimal due to an inadequate volume of blood received in culture bottles   Culture   Final    NO GROWTH 5 DAYS Performed at [facility], [address] [place], [place] [address]    Report Status 06/18/2024 FINAL  Final  Aerobic/Anaerobic Culture w Gram Stain (surgical/deep wound)     Status: None   Collection Time: 06/17/24 12:12 PM   Specimen: Wound; Tissue  Result Value Ref Range Status   Specimen Description TISSUE SCROTUM  Final   Special Requests  FROM SCROTAL ABSCESS  Final   Gram Stain   Final    NO WBC SEEN FEW GRAM POSITIVE COCCI FEW GRAM POSITIVE RODS RARE GRAM NEGATIVE RODS    Culture   Final    RARE KLEBSIELLA PNEUMONIAE Confirmed Extended Spectrum Beta-Lactamase Producer (ESBL).  In bloodstream infections  from ESBL organisms, carbapenems are preferred over piperacillin /tazobactam. They are shown to have a lower risk of mortality. Two isolates with different morphologies were identified as the same organism.The most resistant organism was reported. FEW PREVOTELLA BIVIA BETA LACTAMASE POSITIVE FEW ACTINOMYCES SPECIES Standardized susceptibility testing for this organism is not available. Performed at [facility], [address], [place], [place] [address]    Report Status 06/23/2024 FINAL  Final   Organism ID, Bacteria KLEBSIELLA PNEUMONIAE  Final      Susceptibility   Klebsiella pneumoniae - MIC*    AMPICILLIN  >=32 RESISTANT Resistant     CEFEPIME  <=0.12 SENSITIVE Sensitive     CEFTAZIDIME RESISTANT Resistant     CEFTRIAXONE  <=0.25 SENSITIVE Sensitive     CIPROFLOXACIN  <=0.25 SENSITIVE Sensitive     GENTAMICIN <=1 SENSITIVE Sensitive     IMIPENEM 1 SENSITIVE Sensitive     TRIMETH /SULFA  <=20 SENSITIVE Sensitive     AMPICILLIN /SULBACTAM >=32 RESISTANT Resistant     PIP/TAZO <=4 SENSITIVE Sensitive ug/mL    * RARE KLEBSIELLA PNEUMONIAE  Aerobic/Anaerobic Culture w Gram Stain (surgical/deep wound)     Status: None   Collection Time: 06/17/24 12:13 PM   Specimen: Wound; Abscess  Result Value Ref Range Status   Specimen Description   Final    WOUND Performed at [facility], [address] [place], [place] [address]    Special Requests   Final     SCROTAL ABSCESS Performed at [facility], [address] [place], [place] [address]    Gram Stain   Final    ABUNDANT WBC PRESENT, PREDOMINANTLY PMN MODERATE GRAM POSITIVE COCCI FEW GRAM NEGATIVE RODS RARE GRAM POSITIVE RODS    Culture   Final    RARE KLEBSIELLA PNEUMONIAE MODERATE PREVOTELLA BIVIA BETA LACTAMASE POSITIVE ACTINOMYCES SPECIES Standardized susceptibility testing for this organism is not available. Performed at [facility], [address] [place], [place] [address]    Report Status  06/22/2024 FINAL  Final   Organism ID, Bacteria KLEBSIELLA PNEUMONIAE  Final      Susceptibility   Klebsiella pneumoniae - MIC*    AMPICILLIN  >=32 RESISTANT Resistant     CEFEPIME  <=0.12 SENSITIVE Sensitive     CEFTAZIDIME <=1 SENSITIVE Sensitive     CEFTRIAXONE  <=0.25 SENSITIVE Sensitive     CIPROFLOXACIN  <=0.25 SENSITIVE Sensitive     GENTAMICIN <=1 SENSITIVE Sensitive     IMIPENEM <=0.25 SENSITIVE Sensitive     TRIMETH /SULFA  <=20 SENSITIVE Sensitive     AMPICILLIN /SULBACTAM 4 SENSITIVE Sensitive     PIP/TAZO <=4 SENSITIVE Sensitive ug/mL    * RARE KLEBSIELLA PNEUMONIAE  Resp panel by  RT-PCR (RSV, Flu A&B, Covid) Anterior Nasal Swab     Status: None   Collection Time: 06/24/24 10:21 AM   Specimen: Anterior Nasal Swab  Result Value Ref Range Status   SARS Coronavirus 2 by RT PCR NEGATIVE NEGATIVE Final    Comment: (NOTE) SARS-CoV-2 target nucleic acids are NOT DETECTED.  The SARS-CoV-2 RNA is generally detectable in upper respiratory specimens during the acute phase of infection. The lowest concentration of SARS-CoV-2 viral copies this assay can detect is 138 copies/mL. A negative result does not preclude SARS-Cov-2 infection and should not be used as the sole basis for treatment or other patient management decisions. A negative result may occur with  improper specimen collection/handling, submission of specimen other than nasopharyngeal swab, presence of viral mutation(s) within the areas targeted by this assay, and inadequate number of viral copies(<138 copies/mL). A negative result must be combined with clinical observations, patient history, and epidemiological information. The expected result is Negative.  Fact Sheet for Patients:  BloggerCourse.com  Fact Sheet for Healthcare Providers:  SeriousBroker.it  This test is no t yet approved or cleared by the [place]  FDA and  has been authorized for detection and/or  diagnosis of SARS-CoV-2 by FDA under an Emergency Use Authorization (EUA). This EUA will remain  in effect (meaning this test can be used) for the duration of the COVID-19 declaration under Section 564(b)(1) of the Act, 21 U.S.C.section 360bbb-3(b)(1), unless the authorization is terminated  or revoked sooner.       Influenza A by PCR NEGATIVE NEGATIVE Final   Influenza B by PCR NEGATIVE NEGATIVE Final    Comment: (NOTE) The Xpert Xpress SARS-CoV-2/FLU/RSV plus assay is intended as an aid in the diagnosis of influenza from Nasopharyngeal swab specimens and should not be used as a sole basis for treatment. Nasal washings and aspirates are unacceptable for Xpert Xpress SARS-CoV-2/FLU/RSV testing.  Fact Sheet for Patients: BloggerCourse.com  Fact Sheet for Healthcare Providers: SeriousBroker.it  This test is not yet approved or cleared by the [place]  FDA and has been authorized for detection and/or diagnosis of SARS-CoV-2 by FDA under an Emergency Use Authorization (EUA). This EUA will remain in effect (meaning this test can be used) for the duration of the COVID-19 declaration under Section 564(b)(1) of the Act, 21 U.S.C. section 360bbb-3(b)(1), unless the authorization is terminated or revoked.     Resp Syncytial Virus by PCR NEGATIVE NEGATIVE Final    Comment: (NOTE) Fact Sheet for Patients: BloggerCourse.com  Fact Sheet for Healthcare Providers: SeriousBroker.it  This test is not yet approved or cleared by the [place]  FDA and has been authorized for detection and/or diagnosis of SARS-CoV-2 by FDA under an Emergency Use Authorization (EUA). This EUA will remain in effect (meaning this test can be used) for the duration of the COVID-19 declaration under Section 564(b)(1) of the Act, 21 U.S.C. section 360bbb-3(b)(1), unless the authorization is terminated  or revoked.  Performed at [facility], [address] [place], [place] [address]     Coagulation Studies: No results for input(s): LABPROT, INR in the last 72 hours.  Urinalysis: No results for input(s): COLORURINE, LABSPEC, PHURINE, GLUCOSEU, HGBUR, BILIRUBINUR, KETONESUR, PROTEINUR, UROBILINOGEN, NITRITE, LEUKOCYTESUR in the last 72 hours.  Invalid input(s): APPERANCEUR    Imaging: No results found.   Medications:      amLODipine   10 mg Oral Daily   amoxicillin -clavulanate  1 tablet Oral Q12H   ascorbic acid   500 mg Oral Daily   atorvastatin   40  mg Oral Daily   Chlorhexidine  Gluconate Cloth  6 each Topical Daily   ciprofloxacin   500 mg Oral BID   cyanocobalamin   1,000 mcg Oral Daily   enoxaparin  (LOVENOX ) injection  40 mg Subcutaneous Q24H   famotidine   10 mg Oral BID   feeding supplement (GLUCERNA SHAKE)  237 mL Oral TID BM   fenofibrate   54 mg Oral Daily   ferrous sulfate   325 mg Oral Q lunch   hydrALAZINE   50 mg Oral Q8H   insulin  aspart  8 Units Subcutaneous TID WC   insulin  glargine-yfgn  40 Units Subcutaneous QHS   pantoprazole   40 mg Oral Daily   pregabalin   75 mg Oral BID   sertraline   100 mg Oral Daily   traZODone   100 mg Oral QHS   acetaminophen , HYDROmorphone  (DILAUDID ) injection, HYDROmorphone , hydrOXYzine , loperamide  HCl, ondansetron  (ZOFRAN ) IV, mouth rinse  Assessment/ Plan:  [name] is a 48 y.o.  male with past medical conditions including PVD on Eliquis , type 1 diabetes with insulin  pump, hypertension, hyperlipidemia, left AKA, CKD stage III B,, who was admitted to [facility] on 06/13/2024 for Cellulitis of perineum [L03.315] Cellulitis of scrotum [N49.2]     Acute kidney injury on chronic kidney disease stage IIIb.  Baseline creatinine appears to be 2.09 with GFR 39 on 02/05/2024.  Acute kidney injury likely secondary to infection.  Creatinine 3.16 on admission.  CT pelvis negative for  obstruction.  No IV contrast exposure.  No acute indication for dialysis.  Patient will need to follow-up with his nephrologist at discharge. Lab Results  Component Value Date   CREATININE 2.30 (H) 06/24/2024   CREATININE 2.66 (H) 06/23/2024   CREATININE 2.12 (H) 06/21/2024     Intake/Output Summary (Last 24 hours) at 06/25/2024 1345 Last data filed at 06/25/2024 1043 Gross per 24 hour  Intake 200 ml  Output 1150 ml  Net -950 ml      2. Anemia of chronic kidney disease Hemoglobin 8.9 at last check    3. Acute metabolic acidosis, likely secondary to kidney injury. Resolved.    4.  Insulin -dependent diabetes with chronic kidney disease/renal manifestations:  Home regimen includes NovoLog  and Lantus . Most recent hemoglobin A1c is 9.2 on 05/25/2024.             Well-controlled.  Insulin  managed by primary team.  5.  Sepsis due to cellulitis of scrotum.  Remains on meropenem  twice daily.  Dose adjusted by pharmacy.   LOS: 12 Mike Hamre 8/7/20251:45 PM

## 2024-06-26 DIAGNOSIS — L0889 Other specified local infections of the skin and subcutaneous tissue: Secondary | ICD-10-CM | POA: Diagnosis not present

## 2024-06-26 DIAGNOSIS — G894 Chronic pain syndrome: Secondary | ICD-10-CM | POA: Diagnosis not present

## 2024-06-26 DIAGNOSIS — A4150 Gram-negative sepsis, unspecified: Secondary | ICD-10-CM | POA: Diagnosis not present

## 2024-06-26 DIAGNOSIS — G4700 Insomnia, unspecified: Secondary | ICD-10-CM | POA: Diagnosis not present

## 2024-06-26 DIAGNOSIS — L98492 Non-pressure chronic ulcer of skin of other sites with fat layer exposed: Secondary | ICD-10-CM | POA: Diagnosis not present

## 2024-06-26 DIAGNOSIS — N492 Inflammatory disorders of scrotum: Secondary | ICD-10-CM | POA: Diagnosis not present

## 2024-06-26 DIAGNOSIS — I739 Peripheral vascular disease, unspecified: Secondary | ICD-10-CM | POA: Diagnosis not present

## 2024-06-26 DIAGNOSIS — N179 Acute kidney failure, unspecified: Secondary | ICD-10-CM | POA: Diagnosis not present

## 2024-06-26 DIAGNOSIS — K219 Gastro-esophageal reflux disease without esophagitis: Secondary | ICD-10-CM | POA: Diagnosis not present

## 2024-06-26 DIAGNOSIS — N183 Chronic kidney disease, stage 3 unspecified: Secondary | ICD-10-CM | POA: Diagnosis not present

## 2024-06-26 DIAGNOSIS — D649 Anemia, unspecified: Secondary | ICD-10-CM | POA: Diagnosis not present

## 2024-06-26 DIAGNOSIS — I5032 Chronic diastolic (congestive) heart failure: Secondary | ICD-10-CM | POA: Diagnosis not present

## 2024-06-26 DIAGNOSIS — I130 Hypertensive heart and chronic kidney disease with heart failure and stage 1 through stage 4 chronic kidney disease, or unspecified chronic kidney disease: Secondary | ICD-10-CM | POA: Diagnosis not present

## 2024-06-26 DIAGNOSIS — I509 Heart failure, unspecified: Secondary | ICD-10-CM | POA: Diagnosis not present

## 2024-06-26 DIAGNOSIS — E1065 Type 1 diabetes mellitus with hyperglycemia: Secondary | ICD-10-CM | POA: Diagnosis not present

## 2024-06-29 DIAGNOSIS — N183 Chronic kidney disease, stage 3 unspecified: Secondary | ICD-10-CM | POA: Diagnosis not present

## 2024-06-29 DIAGNOSIS — E1065 Type 1 diabetes mellitus with hyperglycemia: Secondary | ICD-10-CM | POA: Diagnosis not present

## 2024-06-29 DIAGNOSIS — A4150 Gram-negative sepsis, unspecified: Secondary | ICD-10-CM | POA: Diagnosis not present

## 2024-06-29 DIAGNOSIS — I130 Hypertensive heart and chronic kidney disease with heart failure and stage 1 through stage 4 chronic kidney disease, or unspecified chronic kidney disease: Secondary | ICD-10-CM | POA: Diagnosis not present

## 2024-06-29 DIAGNOSIS — N179 Acute kidney failure, unspecified: Secondary | ICD-10-CM | POA: Diagnosis not present

## 2024-06-29 DIAGNOSIS — N492 Inflammatory disorders of scrotum: Secondary | ICD-10-CM | POA: Diagnosis not present

## 2024-06-29 DIAGNOSIS — I739 Peripheral vascular disease, unspecified: Secondary | ICD-10-CM | POA: Diagnosis not present

## 2024-06-29 DIAGNOSIS — Z89512 Acquired absence of left leg below knee: Secondary | ICD-10-CM | POA: Diagnosis not present

## 2024-06-29 DIAGNOSIS — I509 Heart failure, unspecified: Secondary | ICD-10-CM | POA: Diagnosis not present

## 2024-06-30 ENCOUNTER — Inpatient Hospital Stay: Admitting: Infectious Diseases

## 2024-06-30 ENCOUNTER — Encounter (HOSPITAL_COMMUNITY): Payer: Self-pay

## 2024-06-30 ENCOUNTER — Ambulatory Visit (INDEPENDENT_AMBULATORY_CARE_PROVIDER_SITE_OTHER): Admitting: Licensed Clinical Social Worker

## 2024-06-30 DIAGNOSIS — F411 Generalized anxiety disorder: Secondary | ICD-10-CM

## 2024-06-30 DIAGNOSIS — F332 Major depressive disorder, recurrent severe without psychotic features: Secondary | ICD-10-CM

## 2024-06-30 NOTE — Progress Notes (Signed)
 THERAPIST PROGRESS NOTE  Session Time: 9:00 AM to 9:45 AM  Participation Level: Active  Behavioral Response: CasualAlertappropriate  Type of Therapy: Individual Therapy  Treatment Goals addressed: Patient says medical is a big part of the issue not knowing what health is going to do from today. What going to be limited to and what able to do. Health and loss of limbs has caused other losses. Talk about losses and process and identify coping strategies that can help with these stressors. In general work on decreasing symptoms of anxiety and depression.    ProgressTowards Goals: Progressing-completed treatment plan patient gave consent to complete in session started to work on perspective taking looking how life is not going to be the same but still worthwhile as a way to begin to work on symptoms  Interventions: Solution Focused, Strength-based, Supportive, Reframing, and Other: Coping  Summary: Richard Mendoza is a 48 y.o. male who presents with in the hospital more infection. Got out Friday back in rehab facility for wound care and strengthening. Was in the hospital for two weeks. Went in 26 came out Friday. Patient now familiar with how it is, what they have to do, and why. There are so many infections know the process what body requires IV antibiotics Infections disease doctor who follows and speaks highly of her and looks at the big picture.  At the rehab now nurses great struggling with PA change pain meds from doctor and that was a struggle. Would care twice a day not since Friday now packing in groin area and concerned that it will get stuck been there since Friday. Has pain issues and now concern of rip packing from the groin area. Can get reinfected have to mention to staff and it is a stressor. Pain level is up and down instead of being normal because of the way meds prescribed. Worried about being reinfected because not getting wound care. Dealing with that is a stressor.  Worrying about re-infection going back to hospital and surgery. Still fighting for prosthetic waiting on insurance approval.  Deny four times with social security. Sitting with no leg to find income and can't get social security so nothing to his name. Would rather work but physically cannot. Last time only put in social security one amputation nothing else not kidney failure found out congestive heart disease, diabetes, amputation, two bad shoulders a plate in his neck none of was put in record. This time filed on his own. Since February 25 in medical review. Want them to call update every time doctor appointment and hospital stay drags out the process.  Know a guy same neck surgery filed for disability own thing wrong and got approved for disability.  Mom only support. Talked to wife everyday but only about kids. Kids have busy schedule raised kids to provide for themselves. They are 54, 21 in January, 24 and 10. Talk to them on the phone. Stay with older son with him depressing can't get out on his own even though a ramp stuck inside but can't see anything. One window is broken instead of fixing it. He put board on is so can't put sheet to side, sliding glass door can't pull sheet to side. Things cluttered and and window in kitchen is closed off.Remind him of mother-in-law live like vampire windows shut up. Instead of window adds boards so cats don't get in. Son has his son there girlfriend has boy and girl when three get together a zoo. Son and girlfriend go  to room and they are running around. Can control one but not the only two are hers. When out of control try to. But not his place.  You would think would make him comfortable with his son. He is good kid but does his own thing he even complains patient is in recliner stress need money get some where better.   Patient's attitude tries to positive last 2-3 years made it worse. Infection in March and July again stuck in pattern 4-6 months in hospital can't  hold a job like used to do last year. Prosthetic worked with guys knew otherwise [place] violation limited and limited what did. Fighting since 2017. Right now hanging in there has his days.  Legos and table can do at table occupy to take mind off of things. Why did so well the first time rehab things to take mind off. Nights are just as bad mind just runs. Know condition in know can't be what was but find a way to be on own. With below leg prosthetic not as much as could but above knee how much limitation now? Runs though it 24-7. Looked at vets great stuff what gets and how do I do what they are doing with this. He has a better knee and ankle adjustable hydraulic system but sees what is available not get what vets get. Everyone should get the same service.  Patient reviewed thinks make good progress once get into why's and what not's.  Worked on developing therapeutic relationship assess got a good start on that patient agrees getting into things and needs to work on has to be able to process losses has to look at how he has viewed things reviewing whether helpful accurate so some of that we will be processing.  Also needing to work on coping for depression and anxiety patient gave consent to complete treatment plan verbally in session.  Therapist presented her perspective that sees a brightness with him that we will help him and patient says has been positive up until 2 or 3 years ago when has been struggling with all this.  Reviewed the struggles with the system with medical care therapist validating.  Therapist provided perspective look at what he wants and we identified this in session look at may have to modify life but it is not over different but not over looking at from positive perspective instead of negative patient realizes this.  Will be different not the same but work on moving forward seeing what he is able to do with new prosthetic focus will be getting into his own place, working on [employer] working on positive activities he can enjoy help with mood and distraction.  Noted some acceptance the way things are but again life is not over it is just modified and we have to develop a plan with modified life so he can live his fullest with this new life. Suicidal/Homicidal: No  Plan: Return again in 1 week.2.  Use Lego to work on distraction, look at CBT thoughts and feelings book begin to look at patient's perspective work on how he is looking at things help with coping as well as working through loss  Diagnosis: Major depressive disorder, recurrent, severe generalized anxiety disorder  Collaboration of Care: Other none needed  Patient/Guardian was advised Release of Information must be obtained prior to any record release in order to collaborate their care with an outside provider. Patient/Guardian was advised if they have not already done so to contact the  registration department to sign all necessary forms in order for us  to release information regarding their care.   Consent: Patient/Guardian gives verbal consent for treatment and assignment of benefits for services provided during this visit. Patient/Guardian expressed understanding and agreed to proceed.   [name], LCSW 06/30/2024

## 2024-07-01 DIAGNOSIS — I129 Hypertensive chronic kidney disease with stage 1 through stage 4 chronic kidney disease, or unspecified chronic kidney disease: Secondary | ICD-10-CM | POA: Diagnosis not present

## 2024-07-01 DIAGNOSIS — L0889 Other specified local infections of the skin and subcutaneous tissue: Secondary | ICD-10-CM | POA: Diagnosis not present

## 2024-07-01 DIAGNOSIS — Z89512 Acquired absence of left leg below knee: Secondary | ICD-10-CM | POA: Diagnosis not present

## 2024-07-01 DIAGNOSIS — E1065 Type 1 diabetes mellitus with hyperglycemia: Secondary | ICD-10-CM | POA: Diagnosis not present

## 2024-07-01 DIAGNOSIS — N183 Chronic kidney disease, stage 3 unspecified: Secondary | ICD-10-CM | POA: Diagnosis not present

## 2024-07-01 DIAGNOSIS — G894 Chronic pain syndrome: Secondary | ICD-10-CM | POA: Diagnosis not present

## 2024-07-01 DIAGNOSIS — I739 Peripheral vascular disease, unspecified: Secondary | ICD-10-CM | POA: Diagnosis not present

## 2024-07-01 DIAGNOSIS — L98492 Non-pressure chronic ulcer of skin of other sites with fat layer exposed: Secondary | ICD-10-CM | POA: Diagnosis not present

## 2024-07-01 DIAGNOSIS — I5032 Chronic diastolic (congestive) heart failure: Secondary | ICD-10-CM | POA: Diagnosis not present

## 2024-07-01 DIAGNOSIS — D649 Anemia, unspecified: Secondary | ICD-10-CM | POA: Diagnosis not present

## 2024-07-01 DIAGNOSIS — N492 Inflammatory disorders of scrotum: Secondary | ICD-10-CM | POA: Diagnosis not present

## 2024-07-01 DIAGNOSIS — N179 Acute kidney failure, unspecified: Secondary | ICD-10-CM | POA: Diagnosis not present

## 2024-07-01 DIAGNOSIS — A4150 Gram-negative sepsis, unspecified: Secondary | ICD-10-CM | POA: Diagnosis not present

## 2024-07-02 ENCOUNTER — Other Ambulatory Visit
Admission: RE | Admit: 2024-07-02 | Discharge: 2024-07-02 | Disposition: A | Discharge status: Home / Self Care | Source: Ambulatory Visit | Attending: Infectious Diseases | Admitting: Infectious Diseases

## 2024-07-02 ENCOUNTER — Ambulatory Visit: Attending: Infectious Diseases | Admitting: Infectious Diseases

## 2024-07-02 VITALS — BP 136/77 | HR 86 | Temp 98.4°F

## 2024-07-02 DIAGNOSIS — Z89612 Acquired absence of left leg above knee: Secondary | ICD-10-CM | POA: Diagnosis not present

## 2024-07-02 DIAGNOSIS — A429 Actinomycosis, unspecified: Secondary | ICD-10-CM | POA: Diagnosis not present

## 2024-07-02 DIAGNOSIS — D631 Anemia in chronic kidney disease: Secondary | ICD-10-CM | POA: Diagnosis not present

## 2024-07-02 DIAGNOSIS — E1122 Type 2 diabetes mellitus with diabetic chronic kidney disease: Secondary | ICD-10-CM | POA: Diagnosis not present

## 2024-07-02 DIAGNOSIS — Z87891 Personal history of nicotine dependence: Secondary | ICD-10-CM | POA: Insufficient documentation

## 2024-07-02 DIAGNOSIS — N183 Chronic kidney disease, stage 3 unspecified: Secondary | ICD-10-CM | POA: Insufficient documentation

## 2024-07-02 DIAGNOSIS — B961 Klebsiella pneumoniae [K. pneumoniae] as the cause of diseases classified elsewhere: Secondary | ICD-10-CM

## 2024-07-02 DIAGNOSIS — Z794 Long term (current) use of insulin: Secondary | ICD-10-CM | POA: Insufficient documentation

## 2024-07-02 DIAGNOSIS — Z8614 Personal history of Methicillin resistant Staphylococcus aureus infection: Secondary | ICD-10-CM | POA: Insufficient documentation

## 2024-07-02 DIAGNOSIS — N492 Inflammatory disorders of scrotum: Secondary | ICD-10-CM | POA: Insufficient documentation

## 2024-07-02 LAB — CBC WITH DIFFERENTIAL/PLATELET
Abs Immature Granulocytes: 0.02 K/uL (ref 0.00–0.07)
Basophils Absolute: 0.1 K/uL (ref 0.0–0.1)
Basophils Relative: 1 %
Eosinophils Absolute: 0.2 K/uL (ref 0.0–0.5)
Eosinophils Relative: 2 %
HCT: 30.6 % — ABNORMAL LOW (ref 39.0–52.0)
Hemoglobin: 9.7 g/dL — ABNORMAL LOW (ref 13.0–17.0)
Immature Granulocytes: 0 %
Lymphocytes Relative: 16 %
Lymphs Abs: 1.5 K/uL (ref 0.7–4.0)
MCH: 25.1 pg — ABNORMAL LOW (ref 26.0–34.0)
MCHC: 31.7 g/dL (ref 30.0–36.0)
MCV: 79.1 fL — ABNORMAL LOW (ref 80.0–100.0)
Monocytes Absolute: 0.5 K/uL (ref 0.1–1.0)
Monocytes Relative: 5 %
Neutro Abs: 6.9 K/uL (ref 1.7–7.7)
Neutrophils Relative %: 76 %
Platelets: 294 K/uL (ref 150–400)
RBC: 3.87 MIL/uL — ABNORMAL LOW (ref 4.22–5.81)
RDW: 17.5 % — ABNORMAL HIGH (ref 11.5–15.5)
WBC: 9.2 K/uL (ref 4.0–10.5)
nRBC: 0 % (ref 0.0–0.2)

## 2024-07-02 LAB — COMPREHENSIVE METABOLIC PANEL WITH GFR
ALT: 16 U/L (ref 0–44)
AST: 22 U/L (ref 15–41)
Albumin: 3.4 g/dL — ABNORMAL LOW (ref 3.5–5.0)
Alkaline Phosphatase: 76 U/L (ref 38–126)
Anion gap: 6 (ref 5–15)
BUN: 25 mg/dL — ABNORMAL HIGH (ref 6–20)
CO2: 20 mmol/L — ABNORMAL LOW (ref 22–32)
Calcium: 9.1 mg/dL (ref 8.9–10.3)
Chloride: 110 mmol/L (ref 98–111)
Creatinine, Ser: 2.34 mg/dL — ABNORMAL HIGH (ref 0.61–1.24)
GFR, Estimated: 33 mL/min — ABNORMAL LOW (ref >60)
Glucose, Bld: 215 mg/dL — ABNORMAL HIGH (ref 70–99)
Potassium: 4.7 mmol/L (ref 3.5–5.1)
Sodium: 136 mmol/L (ref 135–145)
Total Bilirubin: 0.8 mg/dL (ref 0.0–1.2)
Total Protein: 7.2 g/dL (ref 6.5–8.1)

## 2024-07-02 LAB — SEDIMENTATION RATE: Sed Rate: 33 mm/h — ABNORMAL HIGH (ref 0–15)

## 2024-07-02 LAB — C-REACTIVE PROTEIN: CRP: 1.7 mg/dL — ABNORMAL HIGH (ref <1.0)

## 2024-07-02 MED ORDER — CHLORHEXIDINE GLUCONATE 4 % EX SOLN: Freq: Every day | CUTANEOUS | 1 refills | Status: DC | PRN
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
## 2024-07-02 NOTE — Patient Instructions (Signed)
 You are here for follow up of the scrotal abscess which was I/D on 7/30- continue ciprofloxacin  and Amox/clav unti l8/25/25- Follow  up with urologist on 8/25

## 2024-07-03 DIAGNOSIS — N183 Chronic kidney disease, stage 3 unspecified: Secondary | ICD-10-CM | POA: Diagnosis not present

## 2024-07-03 DIAGNOSIS — G894 Chronic pain syndrome: Secondary | ICD-10-CM | POA: Diagnosis not present

## 2024-07-03 DIAGNOSIS — I739 Peripheral vascular disease, unspecified: Secondary | ICD-10-CM | POA: Diagnosis not present

## 2024-07-03 DIAGNOSIS — N492 Inflammatory disorders of scrotum: Secondary | ICD-10-CM | POA: Diagnosis not present

## 2024-07-03 DIAGNOSIS — A4150 Gram-negative sepsis, unspecified: Secondary | ICD-10-CM | POA: Diagnosis not present

## 2024-07-03 DIAGNOSIS — E1065 Type 1 diabetes mellitus with hyperglycemia: Secondary | ICD-10-CM | POA: Diagnosis not present

## 2024-07-03 DIAGNOSIS — N179 Acute kidney failure, unspecified: Secondary | ICD-10-CM | POA: Diagnosis not present

## 2024-07-03 DIAGNOSIS — Z89512 Acquired absence of left leg below knee: Secondary | ICD-10-CM | POA: Diagnosis not present

## 2024-07-03 DIAGNOSIS — I129 Hypertensive chronic kidney disease with stage 1 through stage 4 chronic kidney disease, or unspecified chronic kidney disease: Secondary | ICD-10-CM | POA: Diagnosis not present

## 2024-07-06 ENCOUNTER — Ambulatory Visit: Payer: Self-pay

## 2024-07-06 DIAGNOSIS — S3130XA Unspecified open wound of scrotum and testes, initial encounter: Secondary | ICD-10-CM | POA: Diagnosis not present

## 2024-07-06 DIAGNOSIS — F32A Depression, unspecified: Secondary | ICD-10-CM | POA: Diagnosis not present

## 2024-07-06 DIAGNOSIS — G4700 Insomnia, unspecified: Secondary | ICD-10-CM | POA: Diagnosis not present

## 2024-07-06 DIAGNOSIS — I110 Hypertensive heart disease with heart failure: Secondary | ICD-10-CM | POA: Diagnosis not present

## 2024-07-06 DIAGNOSIS — I509 Heart failure, unspecified: Secondary | ICD-10-CM | POA: Diagnosis not present

## 2024-07-06 DIAGNOSIS — K219 Gastro-esophageal reflux disease without esophagitis: Secondary | ICD-10-CM | POA: Diagnosis not present

## 2024-07-06 DIAGNOSIS — Z89512 Acquired absence of left leg below knee: Secondary | ICD-10-CM | POA: Diagnosis not present

## 2024-07-06 DIAGNOSIS — N183 Chronic kidney disease, stage 3 unspecified: Secondary | ICD-10-CM | POA: Diagnosis not present

## 2024-07-06 DIAGNOSIS — E1022 Type 1 diabetes mellitus with diabetic chronic kidney disease: Secondary | ICD-10-CM | POA: Diagnosis not present

## 2024-07-06 DIAGNOSIS — I739 Peripheral vascular disease, unspecified: Secondary | ICD-10-CM | POA: Diagnosis not present

## 2024-07-07 ENCOUNTER — Ambulatory Visit (INDEPENDENT_AMBULATORY_CARE_PROVIDER_SITE_OTHER): Admitting: Licensed Clinical Social Worker

## 2024-07-07 DIAGNOSIS — F332 Major depressive disorder, recurrent severe without psychotic features: Secondary | ICD-10-CM | POA: Diagnosis not present

## 2024-07-07 DIAGNOSIS — F411 Generalized anxiety disorder: Secondary | ICD-10-CM | POA: Diagnosis not present

## 2024-07-07 NOTE — Progress Notes (Signed)
 THERAPIST PROGRESS NOTE  Session Time: 9:00 AM to 9:48 AM  Participation Level: Active  Behavioral Response: CasualAlertAnxious and Dysphoric  Type of Therapy: Individual Therapy  Treatment Goals addressed: Patient says medical is a big part of the issue not knowing what health is going to do from today. What going to be limited to and what able to do. Health and loss of limbs has caused other losses. Talk about losses and process and identify coping strategies that can help with these stressors. In general work on decreasing symptoms of anxiety and depression.    ProgressTowards Goals: Progressing-patient struggling with a lot of out of his control and he is inpatient working on reframing to help with coping working on patient finding more helpful coping strategies through things like patient's acceptance letting go  Interventions: Solution Focused, Strength-based, Supportive, Reframing, and Other: Coping  Summary: Richard Mendoza is a 48 y.o. male who presents with everything is squared away with the rehab facility. Frustration with getting his prosthetic. On the positive gender reveal a little granddaughter special mostly boys in the family. Got to go to gender reveal Does get out. News of the week still have the problem not having ability to go and do like want and being able to work. The whole financial situation need to be on own can't do anything to accommodate that. Waiting for disability is hard not a person to sit and also has separation he is dealing with.  Processed patient's thoughts and feelings sensation cannot do what he wants.  Looked at how realistic getting work would be not realistic as every time he does runs into medical issues has spent more time in facilities than home.  But hobby is expensive only thing that calms him down that gets his mind off things.  If not mind is in circles does not know how to shut it off and shut it down.  Therapist pointed out mind is not  always helpful new relationship with the mind.  Wants to shut it off but not be flat.  Noted is well positive reframe helps with these negative thoughts patient says has to figure out a way out of this rut vicious cycle of medical issues.  Has been working on more independence with prosthetic legs but going so slow.  Frustration for patient because he is impatient and used to having more control and we narrow down the issue what to do when you have no control over situation.  Therapist noted does take patient's acceptance letting go it also takes what patient said's sitting back in thinking about how to approach things in other words some smarts to navigate when has to depend on other people.  Noted this is a new way of being take time patient says trying.  Therapist reframed for patient not at a standstill just a lot slower and again letting go of when we would like things to happen when you are not in control helps her mental health.  Not at a standstill because insurance paid for his leg good possibility of getting it on Thursday.  Therapist also wants patient to incorporate things in his day the little things that can make is happy start living like we want to although with patient his resources are limited.  Patient shares he does have his bubble sits with Legos at the window looks out the window therapist said these are some of the things wants to develop with him small things in the day can be  sources of happiness but again reframing how to cope in a situation when you do not have control.  Ended session with patient says will work on being positive.  Suicidal/Homicidal: No  Plan: Return again in 4 weeks.2.  Work with patient on coping strategies where there are a lot of things out of his control. 3.  Patient work on reframe to positive to help with coping. 4.  Can look at thoughts and feelings for depression look at some motivational videos for example Mel Silver the person you want to  become  Diagnosis: Major depressive disorder, recurrent, severe generalized anxiety disorder  Collaboration of Care: Other none needed  Patient/Guardian was advised Release of Information must be obtained prior to any record release in order to collaborate their care with an outside provider. Patient/Guardian was advised if they have not already done so to contact the registration department to sign all necessary forms in order for us  to release information regarding their care.   Consent: Patient/Guardian gives verbal consent for treatment and assignment of benefits for services provided during this visit. Patient/Guardian expressed understanding and agreed to proceed.   [name], LCSW 07/07/2024

## 2024-07-08 DIAGNOSIS — L98492 Non-pressure chronic ulcer of skin of other sites with fat layer exposed: Secondary | ICD-10-CM | POA: Diagnosis not present

## 2024-07-08 DIAGNOSIS — I1310 Hypertensive heart and chronic kidney disease without heart failure, with stage 1 through stage 4 chronic kidney disease, or unspecified chronic kidney disease: Secondary | ICD-10-CM | POA: Diagnosis not present

## 2024-07-08 DIAGNOSIS — Z89512 Acquired absence of left leg below knee: Secondary | ICD-10-CM | POA: Diagnosis not present

## 2024-07-08 DIAGNOSIS — S3130XA Unspecified open wound of scrotum and testes, initial encounter: Secondary | ICD-10-CM | POA: Diagnosis not present

## 2024-07-08 DIAGNOSIS — N183 Chronic kidney disease, stage 3 unspecified: Secondary | ICD-10-CM | POA: Diagnosis not present

## 2024-07-08 DIAGNOSIS — D649 Anemia, unspecified: Secondary | ICD-10-CM | POA: Diagnosis not present

## 2024-07-08 DIAGNOSIS — G894 Chronic pain syndrome: Secondary | ICD-10-CM | POA: Diagnosis not present

## 2024-07-08 DIAGNOSIS — I739 Peripheral vascular disease, unspecified: Secondary | ICD-10-CM | POA: Diagnosis not present

## 2024-07-08 DIAGNOSIS — A4150 Gram-negative sepsis, unspecified: Secondary | ICD-10-CM | POA: Diagnosis not present

## 2024-07-08 DIAGNOSIS — E1022 Type 1 diabetes mellitus with diabetic chronic kidney disease: Secondary | ICD-10-CM | POA: Diagnosis not present

## 2024-07-08 DIAGNOSIS — N492 Inflammatory disorders of scrotum: Secondary | ICD-10-CM | POA: Diagnosis not present

## 2024-07-08 DIAGNOSIS — L0889 Other specified local infections of the skin and subcutaneous tissue: Secondary | ICD-10-CM | POA: Diagnosis not present

## 2024-07-08 DIAGNOSIS — I5032 Chronic diastolic (congestive) heart failure: Secondary | ICD-10-CM | POA: Diagnosis not present

## 2024-07-09 DIAGNOSIS — Z89612 Acquired absence of left leg above knee: Secondary | ICD-10-CM | POA: Diagnosis not present

## 2024-07-10 ENCOUNTER — Ambulatory Visit: Admitting: Physician Assistant

## 2024-07-10 DIAGNOSIS — A4150 Gram-negative sepsis, unspecified: Secondary | ICD-10-CM | POA: Diagnosis not present

## 2024-07-10 DIAGNOSIS — K219 Gastro-esophageal reflux disease without esophagitis: Secondary | ICD-10-CM | POA: Diagnosis not present

## 2024-07-10 DIAGNOSIS — E1022 Type 1 diabetes mellitus with diabetic chronic kidney disease: Secondary | ICD-10-CM | POA: Diagnosis not present

## 2024-07-10 DIAGNOSIS — I129 Hypertensive chronic kidney disease with stage 1 through stage 4 chronic kidney disease, or unspecified chronic kidney disease: Secondary | ICD-10-CM | POA: Diagnosis not present

## 2024-07-10 DIAGNOSIS — N183 Chronic kidney disease, stage 3 unspecified: Secondary | ICD-10-CM | POA: Diagnosis not present

## 2024-07-10 DIAGNOSIS — S3130XD Unspecified open wound of scrotum and testes, subsequent encounter: Secondary | ICD-10-CM | POA: Diagnosis not present

## 2024-07-10 DIAGNOSIS — I739 Peripheral vascular disease, unspecified: Secondary | ICD-10-CM | POA: Diagnosis not present

## 2024-07-10 DIAGNOSIS — N492 Inflammatory disorders of scrotum: Secondary | ICD-10-CM | POA: Diagnosis not present

## 2024-07-10 DIAGNOSIS — Z89512 Acquired absence of left leg below knee: Secondary | ICD-10-CM | POA: Diagnosis not present

## 2024-07-13 ENCOUNTER — Ambulatory Visit: Admitting: Physician Assistant

## 2024-07-13 ENCOUNTER — Ambulatory Visit (INDEPENDENT_AMBULATORY_CARE_PROVIDER_SITE_OTHER): Admitting: Physician Assistant

## 2024-07-13 VITALS — BP 148/83 | HR 89 | Wt 203.7 lb

## 2024-07-13 DIAGNOSIS — N492 Inflammatory disorders of scrotum: Secondary | ICD-10-CM

## 2024-07-13 NOTE — Progress Notes (Signed)
 07/13/2024 10:41 AM   Richard Mendoza 06-13-1976 [id number]  CC: Chief Complaint  Patient presents with   Establish Care   HPI: Richard Mendoza Level is a 48 y.o. male with PMH poorly controlled diabetes, CKD, and recent scrotal cellulitis with left scrotal abscess s/p I&D with Dr. Francisca on 06/17/2024 who presents today for wound check.   Today he reports he will complete outpatient antibiotics today.  He remains at SNF, and dressing changes were not done daily at first, though have become more consistent.  He is a bit concerned that the wound may have closed up rather quickly and created a new pocket for infection reaccumulation.  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    Surgical History: Past Surgical History:  Procedure Laterality Date   AMPUTATION Left 11/07/2020   Procedure: 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;  Surgeon: Neill Boas, DPM;  Location: [facility];  Service: Podiatry;  Laterality: Left;   AMPUTATION Left 11/16/2020   Procedure: AMPUTATION BELOW KNEE;  Surgeon: Marea Selinda RAMAN, MD;  Location: [facility];  Service: Vascular;  Laterality: Left;   AMPUTATION Left 01/29/2024   Procedure: AMPUTATION, ABOVE KNEE;  Surgeon: Marea Selinda RAMAN, MD;  Location: [facility];  Service: General;  Laterality: Left;   ANTERIOR CERVICAL DECOMP/DISCECTOMY FUSION N/A 09/09/2017   Procedure: ANTERIOR CERVICAL DECOMPRESSION/DISCECTOMY FUSION CERVICAL 6- CERVICAL 7;  Surgeon: Gillie Duncans, MD;  Location: [facility];  Service: Neurosurgery;  Laterality: N/A;  ANTERIOR CERVICAL DECOMPRESSION/DISCECTOMY FUSION CERVICAL 6- CERVICAL 7   APPENDECTOMY     I & D EXTREMITY Right 10/03/2017   Procedure: IRRIGATION AND DEBRIDEMENT RIGHT WRIST;   Surgeon: Murrell Drivers, MD;  Location: [facility];  Service: Orthopedics;  Laterality: Right;   I & D EXTREMITY Right 11/26/2018   Procedure: IRRIGATION AND DEBRIDEMENT FASCIA ON RIGHT FOOT;  Surgeon: Ashley Soulier, DPM;  Location: [facility];  Service: Podiatry;  Laterality: Right;   INCISION AND DRAINAGE Right 03/06/2019   Procedure: INCISION AND DRAINAGE RIGHT FOOT, WITH 4th RAY AMPUTATION;  Surgeon: Ashley Soulier, DPM;  Location: [facility];  Service: Podiatry;  Laterality: Right;   INCISION AND DRAINAGE Left 11/07/2020   Procedure: INCISION AND DRAINAGE;  Surgeon: Neill Boas, DPM;  Location: [facility];  Service: Podiatry;  Laterality: Left;   INCISION AND DRAINAGE ABSCESS Left 05/02/2023   Procedure: INCISION AND DRAINAGE ABSCESS LEFT LOWER EXTREMITY;  Surgeon: Marea Selinda RAMAN, MD;  Location: [facility];  Service: Vascular;  Laterality: Left;   INCISION AND DRAINAGE ABSCESS N/A 06/17/2024   Procedure: INCISION AND DRAINAGE, SCROTAL ABSCESS;  Surgeon: Francisca Redell BROCKS, MD;  Location: [facility];  Service: Urology;  Laterality: N/A;  scrotal   IRRIGATION AND DEBRIDEMENT FOOT Left 11/11/2020   Procedure: IRRIGATION AND DEBRIDEMENT FOOT;  Surgeon: Neill Boas, DPM;  Location: [facility];  Service: Podiatry;  Laterality: Left;   METATARSAL HEAD EXCISION Right 05/15/2019   Procedure: OSTECTOMY;MET HEAD 5;  Surgeon: Ashley Soulier, DPM;  Location: [facility];  Service: Podiatry;  Laterality: Right;   osteomylitis     ROTATOR CUFF REPAIR Left    SHOULDER ARTHROSCOPY WITH DEBRIDEMENT AND BICEP TENDON REPAIR Right 08/27/2023   Procedure: SHOULDER ARTHROSCOPY WITH INCISION AND DRAINAGE;  Surgeon: Tobie Priest,  MD;  Location: [facility];  Service: Orthopedics;  Laterality: Right;    Home Medications:  Allergies as of 07/13/2024       Reactions   Oxycodone  Itching   Codeine Itching        Medication List        Accurate as of July 13, 2024 10:41 AM. If you have any questions, ask your nurse or doctor.           acetaminophen  500 MG tablet Commonly known as: TYLENOL  Take 1 tablet (500 mg total) by mouth 3 (three) times daily.   amLODipine  10 MG tablet Commonly known as: NORVASC  Take 1 tablet (10 mg total) by mouth daily.   amoxicillin -clavulanate 500-125 MG tablet Commonly known as: AUGMENTIN  Take 1 tablet by mouth 2 (two) times daily.   ascorbic acid  500 MG tablet Commonly known as: VITAMIN C  Take 1 tablet (500 mg total) by mouth daily.   atorvastatin  40 MG tablet Commonly known as: LIPITOR Take 40 mg by mouth daily.   Basaglar  KwikPen 100 UNIT/ML Inject 40 Units into the skin at bedtime.   chlorhexidine  4 % external liquid Commonly known as: Hibiclens  Apply topically daily as needed.   ciprofloxacin  500 MG tablet Commonly known as: CIPRO  Take 500 mg by mouth 2 (two) times daily.   cyanocobalamin  1000 MCG tablet Commonly known as: VITAMIN B12 Take 1,000 mcg by mouth daily.   Dexcom G7 Sensor Misc Inject 1 Application into the skin as directed. Change sensor every 10 days as directed.   Eliquis  5 MG Tabs tablet Generic drug: apixaban  Take 5 mg by mouth 2 (two) times daily.   famotidine  10 MG tablet Commonly known as: PEPCID  Take 10 mg by mouth 2 (two) times daily.   fenofibrate  54 MG tablet Take 1 tablet (54 mg total) by mouth daily.   ferrous sulfate  325 (65 FE) MG tablet Take 325 mg by mouth daily with breakfast.   hydrALAZINE  50 MG tablet Commonly known as: APRESOLINE  Take 1 tablet (50 mg total) by mouth every 8 (eight) hours.   HYDROmorphone  2 MG tablet Commonly known as: DILAUDID  Take 0.5-1 tablets (1-2 mg total) by mouth every 4 (four) hours as needed for severe pain (pain score 7-10).   hydrOXYzine  25 MG tablet Commonly known as: ATARAX  Take 25 mg by mouth every 8 (eight) hours.   insulin  aspart 100 UNIT/ML injection Commonly known as: novoLOG  Inject 10-16 Units into the skin 3 (three) times daily with meals. 90-150= 10 units; 151-200= 11 units;  201-250= 12 units; 251-300= 13 units; 301-350= 14 units; 351-400= 15 units; 401 or higher = 16 units   INSULIN  SYRINGE .5CC/29G 29G X 1/2 0.5 ML Misc Use to inject insulin  4 times daily   methocarbamol  500 MG tablet Commonly known as: ROBAXIN  Take 1 tablet (500 mg total) by mouth every 8 (eight) hours as needed for muscle spasms.   metoCLOPramide  10 MG tablet Commonly known as: REGLAN  1 tab(s) orally 4 times a day (before meals and at bedtime); Duration: 30   ondansetron  8 MG tablet Commonly known as: ZOFRAN  Take 8 mg by mouth every 8 (eight) hours as needed for nausea or vomiting.   pantoprazole  40 MG tablet Commonly known as: PROTONIX  Take 40 mg by mouth daily.   pregabalin  75 MG capsule Commonly known as: LYRICA  Take 1 capsule (75 mg total) by mouth 3 (three) times daily.   sertraline  100 MG tablet Commonly known as: ZOLOFT  Take 100 mg by mouth daily.  traZODone  100 MG tablet Commonly known as: DESYREL  Take 100 mg by mouth at bedtime.   Vitamin D  (Ergocalciferol ) 1.25 MG (50000 UNIT) Caps capsule Commonly known as: DRISDOL  Take 1 capsule (50,000 Units total) by mouth every 7 (seven) days.        Allergies:  Allergies  Allergen Reactions   Oxycodone  Itching   Codeine Itching    Family History: Family History  Problem Relation Age of Onset   Diabetes Mother    Heart disease Father    Diabetes Father    Arthritis Other    Hyperlipidemia Other    Hypertension Other    Cancer Other        breast   Mental illness Neg Hx     Social History:   reports that he quit smoking about 19 months ago. His smoking use included cigarettes. He started smoking about 22 years ago. He has a 17 pack-year smoking history. He has never used smokeless tobacco. He reports that he does not currently use alcohol. He reports that he does not use drugs.  Physical Exam: BP (!) 148/83 (BP Location: Left Arm, Patient Position: Sitting, Cuff Size: Normal)   Pulse 89   Wt 203 lb  11.2 oz (92.4 kg)   SpO2 98%   BMI 29.23 kg/m   Constitutional:  Alert and oriented, no acute distress, nontoxic appearing HEENT: [place], AT Cardiovascular: No clubbing, cyanosis, or edema Respiratory: Normal respiratory effort, no increased work of breathing GU: Well-healing left scrotal I&D site, shallow and no longer deep enough to except packing.  No erythema or fluctuance. Skin: No rashes, bruises or suspicious lesions Neurologic: Grossly intact, no focal deficits, moving all 4 extremities Psychiatric: Normal mood and affect  Assessment & Plan:   1. Scrotal abscess (Primary) Will healing on exam today.  No further packing needed, though I did recommend covering it with a sterile gauze pad daily until the most superficial layers heal and the wound is no longer moist.  I do not see any evidence of recurrent abscess today.  He may complete antibiotics as prescribed and follow-up with us  as needed.  Return if symptoms worsen or fail to improve.  Lucie Hones, PA-C  [facility] [place] [address], [address] [place], [place] [address] [phone]

## 2024-07-15 DIAGNOSIS — L03314 Cellulitis of groin: Secondary | ICD-10-CM | POA: Diagnosis not present

## 2024-07-15 DIAGNOSIS — H00015 Hordeolum externum left lower eyelid: Secondary | ICD-10-CM | POA: Diagnosis not present

## 2024-07-15 DIAGNOSIS — F32A Depression, unspecified: Secondary | ICD-10-CM | POA: Diagnosis not present

## 2024-07-15 DIAGNOSIS — Z89512 Acquired absence of left leg below knee: Secondary | ICD-10-CM | POA: Diagnosis not present

## 2024-07-15 DIAGNOSIS — N183 Chronic kidney disease, stage 3 unspecified: Secondary | ICD-10-CM | POA: Diagnosis not present

## 2024-07-15 DIAGNOSIS — E785 Hyperlipidemia, unspecified: Secondary | ICD-10-CM | POA: Diagnosis not present

## 2024-07-15 DIAGNOSIS — M62838 Other muscle spasm: Secondary | ICD-10-CM | POA: Diagnosis not present

## 2024-07-15 DIAGNOSIS — E1040 Type 1 diabetes mellitus with diabetic neuropathy, unspecified: Secondary | ICD-10-CM | POA: Diagnosis not present

## 2024-07-15 DIAGNOSIS — N1832 Chronic kidney disease, stage 3b: Secondary | ICD-10-CM | POA: Diagnosis not present

## 2024-07-15 DIAGNOSIS — Z89612 Acquired absence of left leg above knee: Secondary | ICD-10-CM | POA: Diagnosis not present

## 2024-07-15 DIAGNOSIS — K219 Gastro-esophageal reflux disease without esophagitis: Secondary | ICD-10-CM | POA: Diagnosis not present

## 2024-07-15 DIAGNOSIS — B961 Klebsiella pneumoniae [K. pneumoniae] as the cause of diseases classified elsewhere: Secondary | ICD-10-CM | POA: Diagnosis not present

## 2024-07-15 DIAGNOSIS — E1022 Type 1 diabetes mellitus with diabetic chronic kidney disease: Secondary | ICD-10-CM | POA: Diagnosis not present

## 2024-07-15 DIAGNOSIS — I5032 Chronic diastolic (congestive) heart failure: Secondary | ICD-10-CM | POA: Diagnosis not present

## 2024-07-15 DIAGNOSIS — S3130XD Unspecified open wound of scrotum and testes, subsequent encounter: Secondary | ICD-10-CM | POA: Diagnosis not present

## 2024-07-15 DIAGNOSIS — D509 Iron deficiency anemia, unspecified: Secondary | ICD-10-CM | POA: Diagnosis not present

## 2024-07-15 DIAGNOSIS — N492 Inflammatory disorders of scrotum: Secondary | ICD-10-CM | POA: Diagnosis not present

## 2024-07-15 DIAGNOSIS — F419 Anxiety disorder, unspecified: Secondary | ICD-10-CM | POA: Diagnosis not present

## 2024-07-15 DIAGNOSIS — I739 Peripheral vascular disease, unspecified: Secondary | ICD-10-CM | POA: Diagnosis not present

## 2024-07-15 DIAGNOSIS — G4700 Insomnia, unspecified: Secondary | ICD-10-CM | POA: Diagnosis not present

## 2024-07-15 DIAGNOSIS — Z1612 Extended spectrum beta lactamase (ESBL) resistance: Secondary | ICD-10-CM | POA: Diagnosis not present

## 2024-07-15 DIAGNOSIS — I130 Hypertensive heart and chronic kidney disease with heart failure and stage 1 through stage 4 chronic kidney disease, or unspecified chronic kidney disease: Secondary | ICD-10-CM | POA: Diagnosis not present

## 2024-07-15 DIAGNOSIS — I129 Hypertensive chronic kidney disease with stage 1 through stage 4 chronic kidney disease, or unspecified chronic kidney disease: Secondary | ICD-10-CM | POA: Diagnosis not present

## 2024-07-15 DIAGNOSIS — G894 Chronic pain syndrome: Secondary | ICD-10-CM | POA: Diagnosis not present

## 2024-07-15 DIAGNOSIS — Z96641 Presence of right artificial hip joint: Secondary | ICD-10-CM | POA: Diagnosis not present

## 2024-07-15 DIAGNOSIS — A4150 Gram-negative sepsis, unspecified: Secondary | ICD-10-CM | POA: Diagnosis not present

## 2024-07-15 DIAGNOSIS — Z7901 Long term (current) use of anticoagulants: Secondary | ICD-10-CM | POA: Diagnosis not present

## 2024-07-15 DIAGNOSIS — E1051 Type 1 diabetes mellitus with diabetic peripheral angiopathy without gangrene: Secondary | ICD-10-CM | POA: Diagnosis not present

## 2024-07-17 DIAGNOSIS — G8929 Other chronic pain: Secondary | ICD-10-CM | POA: Diagnosis not present

## 2024-07-17 DIAGNOSIS — E1040 Type 1 diabetes mellitus with diabetic neuropathy, unspecified: Secondary | ICD-10-CM | POA: Diagnosis not present

## 2024-07-17 DIAGNOSIS — Z89512 Acquired absence of left leg below knee: Secondary | ICD-10-CM | POA: Diagnosis not present

## 2024-07-20 DIAGNOSIS — D509 Iron deficiency anemia, unspecified: Secondary | ICD-10-CM | POA: Diagnosis not present

## 2024-07-20 DIAGNOSIS — F32A Depression, unspecified: Secondary | ICD-10-CM | POA: Diagnosis not present

## 2024-07-20 DIAGNOSIS — I5032 Chronic diastolic (congestive) heart failure: Secondary | ICD-10-CM | POA: Diagnosis not present

## 2024-07-20 DIAGNOSIS — Z96641 Presence of right artificial hip joint: Secondary | ICD-10-CM | POA: Diagnosis not present

## 2024-07-20 DIAGNOSIS — I130 Hypertensive heart and chronic kidney disease with heart failure and stage 1 through stage 4 chronic kidney disease, or unspecified chronic kidney disease: Secondary | ICD-10-CM | POA: Diagnosis not present

## 2024-07-20 DIAGNOSIS — M62838 Other muscle spasm: Secondary | ICD-10-CM | POA: Diagnosis not present

## 2024-07-20 DIAGNOSIS — E1051 Type 1 diabetes mellitus with diabetic peripheral angiopathy without gangrene: Secondary | ICD-10-CM | POA: Diagnosis not present

## 2024-07-20 DIAGNOSIS — Z1612 Extended spectrum beta lactamase (ESBL) resistance: Secondary | ICD-10-CM | POA: Diagnosis not present

## 2024-07-20 DIAGNOSIS — Z89612 Acquired absence of left leg above knee: Secondary | ICD-10-CM | POA: Diagnosis not present

## 2024-07-20 DIAGNOSIS — E785 Hyperlipidemia, unspecified: Secondary | ICD-10-CM | POA: Diagnosis not present

## 2024-07-20 DIAGNOSIS — G894 Chronic pain syndrome: Secondary | ICD-10-CM | POA: Diagnosis not present

## 2024-07-20 DIAGNOSIS — G4700 Insomnia, unspecified: Secondary | ICD-10-CM | POA: Diagnosis not present

## 2024-07-20 DIAGNOSIS — E1022 Type 1 diabetes mellitus with diabetic chronic kidney disease: Secondary | ICD-10-CM | POA: Diagnosis not present

## 2024-07-20 DIAGNOSIS — F419 Anxiety disorder, unspecified: Secondary | ICD-10-CM | POA: Diagnosis not present

## 2024-07-20 DIAGNOSIS — N1832 Chronic kidney disease, stage 3b: Secondary | ICD-10-CM | POA: Diagnosis not present

## 2024-07-20 DIAGNOSIS — Z7901 Long term (current) use of anticoagulants: Secondary | ICD-10-CM | POA: Diagnosis not present

## 2024-07-20 DIAGNOSIS — B961 Klebsiella pneumoniae [K. pneumoniae] as the cause of diseases classified elsewhere: Secondary | ICD-10-CM | POA: Diagnosis not present

## 2024-07-20 DIAGNOSIS — L03314 Cellulitis of groin: Secondary | ICD-10-CM | POA: Diagnosis not present

## 2024-07-20 DIAGNOSIS — K219 Gastro-esophageal reflux disease without esophagitis: Secondary | ICD-10-CM | POA: Diagnosis not present

## 2024-07-20 DIAGNOSIS — E1040 Type 1 diabetes mellitus with diabetic neuropathy, unspecified: Secondary | ICD-10-CM | POA: Diagnosis not present

## 2024-07-23 DIAGNOSIS — I5032 Chronic diastolic (congestive) heart failure: Secondary | ICD-10-CM | POA: Diagnosis not present

## 2024-07-23 DIAGNOSIS — D649 Anemia, unspecified: Secondary | ICD-10-CM | POA: Diagnosis not present

## 2024-07-23 DIAGNOSIS — L98492 Non-pressure chronic ulcer of skin of other sites with fat layer exposed: Secondary | ICD-10-CM | POA: Diagnosis not present

## 2024-07-23 DIAGNOSIS — L0889 Other specified local infections of the skin and subcutaneous tissue: Secondary | ICD-10-CM | POA: Diagnosis not present

## 2024-07-27 DIAGNOSIS — K219 Gastro-esophageal reflux disease without esophagitis: Secondary | ICD-10-CM | POA: Diagnosis not present

## 2024-07-27 DIAGNOSIS — G894 Chronic pain syndrome: Secondary | ICD-10-CM | POA: Diagnosis not present

## 2024-07-27 DIAGNOSIS — I739 Peripheral vascular disease, unspecified: Secondary | ICD-10-CM | POA: Diagnosis not present

## 2024-07-27 DIAGNOSIS — Z89512 Acquired absence of left leg below knee: Secondary | ICD-10-CM | POA: Diagnosis not present

## 2024-07-27 DIAGNOSIS — E1022 Type 1 diabetes mellitus with diabetic chronic kidney disease: Secondary | ICD-10-CM | POA: Diagnosis not present

## 2024-07-27 DIAGNOSIS — I129 Hypertensive chronic kidney disease with stage 1 through stage 4 chronic kidney disease, or unspecified chronic kidney disease: Secondary | ICD-10-CM | POA: Diagnosis not present

## 2024-07-27 DIAGNOSIS — N183 Chronic kidney disease, stage 3 unspecified: Secondary | ICD-10-CM | POA: Diagnosis not present

## 2024-07-28 ENCOUNTER — Encounter: Payer: Self-pay | Admitting: Nurse Practitioner

## 2024-07-28 ENCOUNTER — Other Ambulatory Visit (INDEPENDENT_AMBULATORY_CARE_PROVIDER_SITE_OTHER): Payer: Self-pay | Admitting: Nurse Practitioner

## 2024-07-28 DIAGNOSIS — Z89612 Acquired absence of left leg above knee: Secondary | ICD-10-CM

## 2024-07-30 ENCOUNTER — Telehealth: Payer: Self-pay | Admitting: *Deleted

## 2024-07-30 DIAGNOSIS — Z794 Long term (current) use of insulin: Secondary | ICD-10-CM

## 2024-07-30 DIAGNOSIS — N492 Inflammatory disorders of scrotum: Secondary | ICD-10-CM | POA: Diagnosis not present

## 2024-07-30 DIAGNOSIS — I129 Hypertensive chronic kidney disease with stage 1 through stage 4 chronic kidney disease, or unspecified chronic kidney disease: Secondary | ICD-10-CM | POA: Diagnosis not present

## 2024-07-30 DIAGNOSIS — N1832 Chronic kidney disease, stage 3b: Secondary | ICD-10-CM | POA: Diagnosis not present

## 2024-07-30 DIAGNOSIS — M7989 Other specified soft tissue disorders: Secondary | ICD-10-CM | POA: Diagnosis not present

## 2024-07-30 NOTE — Progress Notes (Unsigned)
 Complex Care Management Note Care Guide Note  07/30/2024 Name: Richard Mendoza MRN: [id number] DOB: 14-Jan-1976   Complex Care Management Outreach Attempts: An unsuccessful telephone outreach was attempted today to offer the patient information about available complex care management services.  Follow Up Plan:  Additional outreach attempts will be made to offer the patient complex care management information and services.   Encounter Outcome:  No Answer  Harlene Satterfield  [facility]  [facility], [facility] Guide  Direct Dial: [phone]  Fax [phone]

## 2024-07-31 ENCOUNTER — Ambulatory Visit (INDEPENDENT_AMBULATORY_CARE_PROVIDER_SITE_OTHER)

## 2024-07-31 ENCOUNTER — Encounter (INDEPENDENT_AMBULATORY_CARE_PROVIDER_SITE_OTHER): Payer: Self-pay | Admitting: Vascular Surgery

## 2024-07-31 ENCOUNTER — Ambulatory Visit (INDEPENDENT_AMBULATORY_CARE_PROVIDER_SITE_OTHER): Admitting: Vascular Surgery

## 2024-07-31 VITALS — BP 168/88 | HR 89

## 2024-07-31 DIAGNOSIS — I10 Essential (primary) hypertension: Secondary | ICD-10-CM | POA: Diagnosis not present

## 2024-07-31 DIAGNOSIS — Z89612 Acquired absence of left leg above knee: Secondary | ICD-10-CM | POA: Diagnosis not present

## 2024-07-31 DIAGNOSIS — E785 Hyperlipidemia, unspecified: Secondary | ICD-10-CM | POA: Diagnosis not present

## 2024-07-31 DIAGNOSIS — E1142 Type 2 diabetes mellitus with diabetic polyneuropathy: Secondary | ICD-10-CM

## 2024-07-31 NOTE — Progress Notes (Signed)
 Complex Care Management Note  Care Guide Note 07/31/2024 Name: Richard Mendoza MRN: [id number] DOB: [birth year]  Richard Mendoza is a 48 y.o. year old male who sees Carles Seltzer, MD for primary care. I reached out to Ozell Glendia Sauve by phone today to offer complex care management services.  Mr. Eley was given information about Complex Care Management services today including:   The Complex Care Management services include support from the care team which includes your Nurse Care Manager, Clinical Social Worker, or Pharmacist.  The Complex Care Management team is here to help remove barriers to the health concerns and goals most important to you. Complex Care Management services are voluntary, and the patient may decline or stop services at any time by request to their care team member.   Complex Care Management Consent Status: Patient did not agree to participate in complex care management services at this time.  Follow up plan:  None  Encounter Outcome:  Patient Refused  Harlene Satterfield  [facility]  [facility], [facility] Guide  Direct Dial: [phone]  Fax [phone]

## 2024-08-03 LAB — VAS US ABI WITH/WO TBI: Right ABI: 1.02

## 2024-08-04 ENCOUNTER — Ambulatory Visit (HOSPITAL_COMMUNITY): Admitting: Licensed Clinical Social Worker

## 2024-08-04 DIAGNOSIS — F411 Generalized anxiety disorder: Secondary | ICD-10-CM

## 2024-08-04 DIAGNOSIS — F332 Major depressive disorder, recurrent severe without psychotic features: Secondary | ICD-10-CM

## 2024-08-04 NOTE — Progress Notes (Addendum)
 THERAPIST PROGRESS NOTE  Session Time: 11:00 AM to 11:45 AM  Participation Level: Active  Behavioral Response: CasualAlertappropriate  Type of Therapy: Individual Therapy  Treatment Goals addressed: Patient says medical is a big part of the issue not knowing what health is going to do from today. What going to be limited to and what able to do. Health and loss of limbs has caused other losses. Talk about losses and process and identify coping strategies that can help with these stressors. In general work on decreasing symptoms of anxiety and depression.    ProgressTowards Goals: Progressing-therapist notes patient a bit more positive and both of us  agree good reason there is reason for hope patient can see some progress still working in session on patient's worries and stressors talked about relationship as a way to release emotion help gaining clarity with this as well  Interventions: Solution Focused, Strength-based, Supportive, Reframing, and Other: Coping  Summary: Richard Mendoza is a 48 y.o. male who presents with patient says up side of being able to do things independently at the same time will be going back to his son's place with that probably next two weeks that is the downside because this can be a whole different story how to navigate in that environment.  He has his prosthetic and it has been working out in rehab every day.  Therapist reminded him of his concern would not get a time so pointed that out as a positive, remind him he was worried about that and that has turned out to be positive.  Patient says sees the upside still frustration pushes hard thinks things should be better processed should be faster build confidence seeing the progress but then 2 steps forward and 1 step back.  Therapist noted is going to be like this in his situation or not everything is something in his hands it is going to be a slower process and patient understands that.  Patient's concern is going  home not having a rehab.  Noted he will do ongoing outpatient rehab is a positive.  Talked about his plan showed therapist Wyn which are amazing plans to sell on its he did have a little extra cash.  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  Does get depressed tries not to focus on things like Social Security tries to think positive still happens but not as much.  Feels in a good situation now can look at the window and the way he looks at it is like going to the dungeon when he goes to his sons therapist explored other types of art work he can do.  He says more positive going home this time not so down there is more mobility can walk with a cane can be in the kitchen have the cane and also be able to function and do things with his hands also worse comes to worse he can rely himself.  Has more mobility to get outside with his leg sitting on the porch something relaxing. Talked about relationship issues does not know where stand and relationship both of us  great she has both in and out of relationship sometimes acting like his wife sometimes not it started when showed interest in ex then interest with somebody she met while working.  Noted it is on her to figure out whether she is in her outpatient wants to be in the relationship but at  some point he has a life and he has to decide at what point he has waited too much.  He wonders how he can trust her and therapist noted she is going to have to build back trust but part of relationship will be a commitment to the relationship not healthy for her to be straining from the relationship.  Patient saw the changes her grandmother died mom died ex-boyfriend overdose started drinking a lot thinks she lost herself understand to the point did not trust her for 20 years.  Therapist noticed not fair on him emotionally to leave him hanging and not sure need to decide 1 way or the  other for his emotional wellbeing and whether needs to move on.  He is trying to get his life together noted the pile up of too much has been like that for 6 years 4 to 6 months in the hospital due to an infection talked about treatment for that there is nothing long-term for treatment.  Reviewing relationship after his first amputation things went south patient has the thought 1 of disability does not happen but working on being positive good days and bad but does see progress and therapist noted it is human to not always be positive and be kind to ourselves about when we fall short. This patient himself says wife trying to find someone like him and cannot find someone like him who has his qualities including his mental strength.  Therapist likes also the fact that he distracts himself as a way to refocus noting a form of reframing helpful to use when we go negative.  Therapist provided support and space for patient to process thoughts and feelings in session.  Suicidal/Homicidal: No  Plan: Return again in 1 week.2.  Continue to support patient with building momentum for the positive while we continued to help with stressors processing talk about coping 3.  Look at worksheet on patience  Diagnosis:  Major depressive disorder, recurrent, severe generalized anxiety disorder   Collaboration of Care: Other none needed  Patient/Guardian was advised Release of Information must be obtained prior to any record release in order to collaborate their care with an outside provider. Patient/Guardian was advised if they have not already done so to contact the registration department to sign all necessary forms in order for us  to release information regarding their care.   Consent: Patient/Guardian gives verbal consent for treatment and assignment of benefits for services provided during this visit. Patient/Guardian expressed understanding and agreed to proceed.   [name], LCSW 08/04/2024

## 2024-08-05 ENCOUNTER — Encounter (INDEPENDENT_AMBULATORY_CARE_PROVIDER_SITE_OTHER): Payer: Self-pay | Admitting: Vascular Surgery
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
## 2024-08-10 ENCOUNTER — Ambulatory Visit (HOSPITAL_COMMUNITY): Admitting: Licensed Clinical Social Worker

## 2024-08-10 DIAGNOSIS — F411 Generalized anxiety disorder: Secondary | ICD-10-CM

## 2024-08-10 DIAGNOSIS — F332 Major depressive disorder, recurrent severe without psychotic features: Secondary | ICD-10-CM

## 2024-08-10 NOTE — Progress Notes (Signed)
 Patient did not show for session.

## 2024-08-11 ENCOUNTER — Ambulatory Visit (HOSPITAL_COMMUNITY): Admitting: Licensed Clinical Social Worker

## 2024-08-13 DIAGNOSIS — M546 Pain in thoracic spine: Secondary | ICD-10-CM | POA: Diagnosis not present

## 2024-08-13 DIAGNOSIS — M25552 Pain in left hip: Secondary | ICD-10-CM | POA: Diagnosis not present

## 2024-08-13 DIAGNOSIS — M129 Arthropathy, unspecified: Secondary | ICD-10-CM | POA: Diagnosis not present

## 2024-08-13 DIAGNOSIS — E118 Type 2 diabetes mellitus with unspecified complications: Secondary | ICD-10-CM | POA: Diagnosis not present

## 2024-08-13 DIAGNOSIS — Z79899 Other long term (current) drug therapy: Secondary | ICD-10-CM | POA: Diagnosis not present

## 2024-08-13 DIAGNOSIS — M25561 Pain in right knee: Secondary | ICD-10-CM | POA: Diagnosis not present

## 2024-08-13 DIAGNOSIS — M549 Dorsalgia, unspecified: Secondary | ICD-10-CM | POA: Diagnosis not present

## 2024-08-13 DIAGNOSIS — M25461 Effusion, right knee: Secondary | ICD-10-CM | POA: Diagnosis not present

## 2024-08-13 DIAGNOSIS — R0602 Shortness of breath: Secondary | ICD-10-CM | POA: Diagnosis not present

## 2024-08-17 DIAGNOSIS — M25561 Pain in right knee: Secondary | ICD-10-CM | POA: Diagnosis not present

## 2024-08-17 DIAGNOSIS — M25461 Effusion, right knee: Secondary | ICD-10-CM | POA: Diagnosis not present

## 2024-08-17 DIAGNOSIS — M25552 Pain in left hip: Secondary | ICD-10-CM | POA: Diagnosis not present

## 2024-08-17 DIAGNOSIS — Z89512 Acquired absence of left leg below knee: Secondary | ICD-10-CM | POA: Diagnosis not present

## 2024-08-17 DIAGNOSIS — R21 Rash and other nonspecific skin eruption: Secondary | ICD-10-CM | POA: Diagnosis not present

## 2024-08-17 DIAGNOSIS — M549 Dorsalgia, unspecified: Secondary | ICD-10-CM | POA: Diagnosis not present

## 2024-08-17 DIAGNOSIS — M546 Pain in thoracic spine: Secondary | ICD-10-CM | POA: Diagnosis not present

## 2024-08-17 DIAGNOSIS — E1040 Type 1 diabetes mellitus with diabetic neuropathy, unspecified: Secondary | ICD-10-CM | POA: Diagnosis not present

## 2024-08-17 DIAGNOSIS — G894 Chronic pain syndrome: Secondary | ICD-10-CM | POA: Diagnosis not present

## 2024-08-18 ENCOUNTER — Ambulatory Visit (INDEPENDENT_AMBULATORY_CARE_PROVIDER_SITE_OTHER): Admitting: Licensed Clinical Social Worker

## 2024-08-18 DIAGNOSIS — F332 Major depressive disorder, recurrent severe without psychotic features: Secondary | ICD-10-CM

## 2024-08-18 DIAGNOSIS — F411 Generalized anxiety disorder: Secondary | ICD-10-CM | POA: Diagnosis not present
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
## 2024-08-19 DIAGNOSIS — L03314 Cellulitis of groin: Secondary | ICD-10-CM | POA: Diagnosis not present

## 2024-08-19 DIAGNOSIS — Z89612 Acquired absence of left leg above knee: Secondary | ICD-10-CM | POA: Diagnosis not present

## 2024-08-19 DIAGNOSIS — Z6831 Body mass index (BMI) 31.0-31.9, adult: Secondary | ICD-10-CM | POA: Diagnosis not present

## 2024-08-19 DIAGNOSIS — I130 Hypertensive heart and chronic kidney disease with heart failure and stage 1 through stage 4 chronic kidney disease, or unspecified chronic kidney disease: Secondary | ICD-10-CM | POA: Diagnosis not present

## 2024-08-19 DIAGNOSIS — N492 Inflammatory disorders of scrotum: Secondary | ICD-10-CM | POA: Diagnosis not present

## 2024-08-19 DIAGNOSIS — E1051 Type 1 diabetes mellitus with diabetic peripheral angiopathy without gangrene: Secondary | ICD-10-CM | POA: Diagnosis not present

## 2024-08-19 DIAGNOSIS — Z79899 Other long term (current) drug therapy: Secondary | ICD-10-CM | POA: Diagnosis not present

## 2024-08-19 DIAGNOSIS — M25461 Effusion, right knee: Secondary | ICD-10-CM | POA: Diagnosis not present

## 2024-08-19 DIAGNOSIS — G894 Chronic pain syndrome: Secondary | ICD-10-CM | POA: Diagnosis not present

## 2024-08-19 DIAGNOSIS — E785 Hyperlipidemia, unspecified: Secondary | ICD-10-CM | POA: Diagnosis not present

## 2024-08-19 DIAGNOSIS — B961 Klebsiella pneumoniae [K. pneumoniae] as the cause of diseases classified elsewhere: Secondary | ICD-10-CM | POA: Diagnosis not present

## 2024-08-19 DIAGNOSIS — M25552 Pain in left hip: Secondary | ICD-10-CM | POA: Diagnosis not present

## 2024-08-19 DIAGNOSIS — Z96641 Presence of right artificial hip joint: Secondary | ICD-10-CM | POA: Diagnosis not present

## 2024-08-19 DIAGNOSIS — E1040 Type 1 diabetes mellitus with diabetic neuropathy, unspecified: Secondary | ICD-10-CM | POA: Diagnosis not present

## 2024-08-19 DIAGNOSIS — E1022 Type 1 diabetes mellitus with diabetic chronic kidney disease: Secondary | ICD-10-CM | POA: Diagnosis not present

## 2024-08-19 DIAGNOSIS — M25561 Pain in right knee: Secondary | ICD-10-CM | POA: Diagnosis not present

## 2024-08-19 DIAGNOSIS — Z7901 Long term (current) use of anticoagulants: Secondary | ICD-10-CM | POA: Diagnosis not present

## 2024-08-19 DIAGNOSIS — G4700 Insomnia, unspecified: Secondary | ICD-10-CM | POA: Diagnosis not present

## 2024-08-19 DIAGNOSIS — F32A Depression, unspecified: Secondary | ICD-10-CM | POA: Diagnosis not present

## 2024-08-19 DIAGNOSIS — M546 Pain in thoracic spine: Secondary | ICD-10-CM | POA: Diagnosis not present

## 2024-08-19 DIAGNOSIS — N289 Disorder of kidney and ureter, unspecified: Secondary | ICD-10-CM | POA: Diagnosis not present

## 2024-08-19 DIAGNOSIS — K219 Gastro-esophageal reflux disease without esophagitis: Secondary | ICD-10-CM | POA: Diagnosis not present

## 2024-08-19 DIAGNOSIS — D509 Iron deficiency anemia, unspecified: Secondary | ICD-10-CM | POA: Diagnosis not present

## 2024-08-19 DIAGNOSIS — Z89512 Acquired absence of left leg below knee: Secondary | ICD-10-CM | POA: Diagnosis not present

## 2024-08-19 DIAGNOSIS — E119 Type 2 diabetes mellitus without complications: Secondary | ICD-10-CM | POA: Diagnosis not present

## 2024-08-19 DIAGNOSIS — F419 Anxiety disorder, unspecified: Secondary | ICD-10-CM | POA: Diagnosis not present

## 2024-08-19 DIAGNOSIS — M62838 Other muscle spasm: Secondary | ICD-10-CM | POA: Diagnosis not present

## 2024-08-19 DIAGNOSIS — I5032 Chronic diastolic (congestive) heart failure: Secondary | ICD-10-CM | POA: Diagnosis not present

## 2024-08-19 DIAGNOSIS — N1832 Chronic kidney disease, stage 3b: Secondary | ICD-10-CM | POA: Diagnosis not present

## 2024-08-19 DIAGNOSIS — Z1612 Extended spectrum beta lactamase (ESBL) resistance: Secondary | ICD-10-CM | POA: Diagnosis not present

## 2024-08-20 ENCOUNTER — Other Ambulatory Visit: Payer: Self-pay | Admitting: Nurse Practitioner

## 2024-08-20 ENCOUNTER — Other Ambulatory Visit: Payer: Self-pay

## 2024-08-20 DIAGNOSIS — Z89512 Acquired absence of left leg below knee: Secondary | ICD-10-CM | POA: Diagnosis not present

## 2024-08-20 DIAGNOSIS — M79671 Pain in right foot: Secondary | ICD-10-CM | POA: Diagnosis not present

## 2024-08-20 DIAGNOSIS — Z9181 History of falling: Secondary | ICD-10-CM | POA: Diagnosis not present

## 2024-08-20 DIAGNOSIS — S9031XA Contusion of right foot, initial encounter: Secondary | ICD-10-CM | POA: Diagnosis not present

## 2024-08-20 MED ORDER — OMNIPOD 5 DEXG7G6 PODS GEN 5 MISC
6 refills | Status: DC
  Filled 2024-08-20 (×2): qty 15, 30d supply, fill #0
  Filled 2024-09-21: qty 15, 30d supply, fill #1
  Filled 2024-11-18: qty 15, 30d supply, fill #2

## 2024-08-20 NOTE — Progress Notes (Signed)
[email or web address]

## 2024-08-24 ENCOUNTER — Encounter: Payer: Self-pay | Admitting: Nurse Practitioner

## 2024-08-24 DIAGNOSIS — M1612 Unilateral primary osteoarthritis, left hip: Secondary | ICD-10-CM | POA: Diagnosis not present

## 2024-08-24 DIAGNOSIS — M25512 Pain in left shoulder: Secondary | ICD-10-CM | POA: Diagnosis not present

## 2024-08-24 DIAGNOSIS — S78112A Complete traumatic amputation at level between left hip and knee, initial encounter: Secondary | ICD-10-CM | POA: Diagnosis not present

## 2024-08-24 DIAGNOSIS — Z79899 Other long term (current) drug therapy: Secondary | ICD-10-CM | POA: Diagnosis not present

## 2024-08-24 DIAGNOSIS — M25511 Pain in right shoulder: Secondary | ICD-10-CM | POA: Diagnosis not present

## 2024-08-24 DIAGNOSIS — M545 Low back pain, unspecified: Secondary | ICD-10-CM | POA: Diagnosis not present

## 2024-08-26 DIAGNOSIS — Z9181 History of falling: Secondary | ICD-10-CM | POA: Diagnosis not present

## 2024-08-26 DIAGNOSIS — Z89512 Acquired absence of left leg below knee: Secondary | ICD-10-CM | POA: Diagnosis not present

## 2024-08-26 DIAGNOSIS — G894 Chronic pain syndrome: Secondary | ICD-10-CM | POA: Diagnosis not present

## 2024-08-26 DIAGNOSIS — E1022 Type 1 diabetes mellitus with diabetic chronic kidney disease: Secondary | ICD-10-CM | POA: Diagnosis not present

## 2024-08-26 DIAGNOSIS — N183 Chronic kidney disease, stage 3 unspecified: Secondary | ICD-10-CM | POA: Diagnosis not present

## 2024-08-26 DIAGNOSIS — M79671 Pain in right foot: Secondary | ICD-10-CM | POA: Diagnosis not present

## 2024-08-26 DIAGNOSIS — M1711 Unilateral primary osteoarthritis, right knee: Secondary | ICD-10-CM | POA: Diagnosis not present

## 2024-08-26 DIAGNOSIS — I129 Hypertensive chronic kidney disease with stage 1 through stage 4 chronic kidney disease, or unspecified chronic kidney disease: Secondary | ICD-10-CM | POA: Diagnosis not present

## 2024-08-26 DIAGNOSIS — R600 Localized edema: Secondary | ICD-10-CM | POA: Diagnosis not present

## 2024-09-14 DIAGNOSIS — E1022 Type 1 diabetes mellitus with diabetic chronic kidney disease: Secondary | ICD-10-CM | POA: Diagnosis not present

## 2024-09-14 DIAGNOSIS — I129 Hypertensive chronic kidney disease with stage 1 through stage 4 chronic kidney disease, or unspecified chronic kidney disease: Secondary | ICD-10-CM | POA: Diagnosis not present

## 2024-09-14 DIAGNOSIS — K219 Gastro-esophageal reflux disease without esophagitis: Secondary | ICD-10-CM | POA: Diagnosis not present

## 2024-09-14 DIAGNOSIS — Z89612 Acquired absence of left leg above knee: Secondary | ICD-10-CM | POA: Diagnosis not present

## 2024-09-14 DIAGNOSIS — G894 Chronic pain syndrome: Secondary | ICD-10-CM | POA: Diagnosis not present

## 2024-09-14 DIAGNOSIS — N184 Chronic kidney disease, stage 4 (severe): Secondary | ICD-10-CM | POA: Diagnosis not present

## 2024-09-16 DIAGNOSIS — L03314 Cellulitis of groin: Secondary | ICD-10-CM | POA: Diagnosis not present

## 2024-09-16 DIAGNOSIS — N1832 Chronic kidney disease, stage 3b: Secondary | ICD-10-CM | POA: Diagnosis not present

## 2024-09-16 DIAGNOSIS — Z1612 Extended spectrum beta lactamase (ESBL) resistance: Secondary | ICD-10-CM | POA: Diagnosis not present

## 2024-09-16 DIAGNOSIS — E1051 Type 1 diabetes mellitus with diabetic peripheral angiopathy without gangrene: Secondary | ICD-10-CM | POA: Diagnosis not present

## 2024-09-16 DIAGNOSIS — N492 Inflammatory disorders of scrotum: Secondary | ICD-10-CM | POA: Diagnosis not present

## 2024-09-16 DIAGNOSIS — B961 Klebsiella pneumoniae [K. pneumoniae] as the cause of diseases classified elsewhere: Secondary | ICD-10-CM | POA: Diagnosis not present

## 2024-09-16 DIAGNOSIS — E1040 Type 1 diabetes mellitus with diabetic neuropathy, unspecified: Secondary | ICD-10-CM | POA: Diagnosis not present

## 2024-09-16 DIAGNOSIS — E1022 Type 1 diabetes mellitus with diabetic chronic kidney disease: Secondary | ICD-10-CM | POA: Diagnosis not present

## 2024-09-16 DIAGNOSIS — N184 Chronic kidney disease, stage 4 (severe): Secondary | ICD-10-CM | POA: Diagnosis not present

## 2024-09-16 DIAGNOSIS — Z7901 Long term (current) use of anticoagulants: Secondary | ICD-10-CM | POA: Diagnosis not present

## 2024-09-16 DIAGNOSIS — I5032 Chronic diastolic (congestive) heart failure: Secondary | ICD-10-CM | POA: Diagnosis not present

## 2024-09-16 DIAGNOSIS — I130 Hypertensive heart and chronic kidney disease with heart failure and stage 1 through stage 4 chronic kidney disease, or unspecified chronic kidney disease: Secondary | ICD-10-CM | POA: Diagnosis not present

## 2024-09-16 DIAGNOSIS — Z96641 Presence of right artificial hip joint: Secondary | ICD-10-CM | POA: Diagnosis not present

## 2024-09-16 DIAGNOSIS — G894 Chronic pain syndrome: Secondary | ICD-10-CM | POA: Diagnosis not present

## 2024-09-21 ENCOUNTER — Ambulatory Visit (HOSPITAL_COMMUNITY): Admitting: Licensed Clinical Social Worker

## 2024-09-21 ENCOUNTER — Other Ambulatory Visit: Payer: Self-pay

## 2024-09-21 DIAGNOSIS — A419 Sepsis, unspecified organism: Secondary | ICD-10-CM | POA: Diagnosis not present

## 2024-09-21 DIAGNOSIS — N184 Chronic kidney disease, stage 4 (severe): Secondary | ICD-10-CM | POA: Diagnosis not present

## 2024-09-21 DIAGNOSIS — N179 Acute kidney failure, unspecified: Secondary | ICD-10-CM | POA: Diagnosis not present

## 2024-09-21 DIAGNOSIS — M79671 Pain in right foot: Secondary | ICD-10-CM | POA: Diagnosis not present

## 2024-09-21 DIAGNOSIS — E1040 Type 1 diabetes mellitus with diabetic neuropathy, unspecified: Secondary | ICD-10-CM | POA: Diagnosis not present

## 2024-09-21 DIAGNOSIS — A498 Other bacterial infections of unspecified site: Secondary | ICD-10-CM | POA: Diagnosis not present

## 2024-09-21 DIAGNOSIS — R600 Localized edema: Secondary | ICD-10-CM | POA: Diagnosis not present

## 2024-09-21 DIAGNOSIS — N492 Inflammatory disorders of scrotum: Secondary | ICD-10-CM | POA: Diagnosis not present

## 2024-09-21 DIAGNOSIS — M25512 Pain in left shoulder: Secondary | ICD-10-CM | POA: Diagnosis not present

## 2024-09-21 DIAGNOSIS — G546 Phantom limb syndrome with pain: Secondary | ICD-10-CM | POA: Diagnosis not present

## 2024-09-21 DIAGNOSIS — Z89612 Acquired absence of left leg above knee: Secondary | ICD-10-CM | POA: Diagnosis not present

## 2024-09-21 DIAGNOSIS — Z79899 Other long term (current) drug therapy: Secondary | ICD-10-CM | POA: Diagnosis not present

## 2024-09-21 DIAGNOSIS — E1022 Type 1 diabetes mellitus with diabetic chronic kidney disease: Secondary | ICD-10-CM | POA: Diagnosis not present

## 2024-09-21 DIAGNOSIS — Z9181 History of falling: Secondary | ICD-10-CM | POA: Diagnosis not present

## 2024-09-21 DIAGNOSIS — K219 Gastro-esophageal reflux disease without esophagitis: Secondary | ICD-10-CM | POA: Diagnosis not present

## 2024-09-21 DIAGNOSIS — G894 Chronic pain syndrome: Secondary | ICD-10-CM | POA: Diagnosis not present

## 2024-09-21 DIAGNOSIS — M25511 Pain in right shoulder: Secondary | ICD-10-CM | POA: Diagnosis not present

## 2024-09-21 DIAGNOSIS — S78112A Complete traumatic amputation at level between left hip and knee, initial encounter: Secondary | ICD-10-CM | POA: Diagnosis not present

## 2024-09-23 ENCOUNTER — Telehealth: Payer: Self-pay

## 2024-09-23 ENCOUNTER — Encounter: Payer: Self-pay | Admitting: Nurse Practitioner

## 2024-09-23 ENCOUNTER — Telehealth: Payer: Self-pay | Admitting: Nurse Practitioner

## 2024-09-23 ENCOUNTER — Ambulatory Visit (INDEPENDENT_AMBULATORY_CARE_PROVIDER_SITE_OTHER): Admitting: Nurse Practitioner

## 2024-09-23 ENCOUNTER — Other Ambulatory Visit (HOSPITAL_COMMUNITY): Payer: Self-pay

## 2024-09-23 VITALS — BP 136/84 | HR 76 | Ht 70.0 in | Wt 231.4 lb

## 2024-09-23 DIAGNOSIS — E782 Mixed hyperlipidemia: Secondary | ICD-10-CM

## 2024-09-23 DIAGNOSIS — Z794 Long term (current) use of insulin: Secondary | ICD-10-CM

## 2024-09-23 DIAGNOSIS — I10 Essential (primary) hypertension: Secondary | ICD-10-CM

## 2024-09-23 DIAGNOSIS — E1165 Type 2 diabetes mellitus with hyperglycemia: Secondary | ICD-10-CM

## 2024-09-23 DIAGNOSIS — R600 Localized edema: Secondary | ICD-10-CM | POA: Diagnosis not present

## 2024-09-23 DIAGNOSIS — R942 Abnormal results of pulmonary function studies: Secondary | ICD-10-CM | POA: Diagnosis not present

## 2024-09-23 LAB — POCT GLYCOSYLATED HEMOGLOBIN (HGB A1C): Hemoglobin A1C: 7.1 % — AB (ref 4.0–5.6)

## 2024-09-23 MED ORDER — INSULIN ASPART 100 UNIT/ML IJ SOLN: INTRAMUSCULAR | 3 refills | Status: DC

## 2024-09-23 MED ORDER — DEXCOM G7 SENSOR MISC: 1.0000 | 3 refills | Status: DC

## 2024-09-23 NOTE — Telephone Encounter (Signed)
 Pharmacy Patient Advocate Encounter   Received notification from Pt Calls Messages that prior authorization for Dexcom G7 sensor is required/requested.   Insurance verification completed.   The patient is insured through [facility].   Per test claim: PA required; PA submitted to above mentioned insurance via Latent Key/confirmation #/EOC [id number] Status is pending

## 2024-09-23 NOTE — Progress Notes (Signed)
 Endocrinology Follow Up Note       09/23/2024, 10:35 AM   Subjective:    Patient ID: Richard Mendoza, male    DOB: December 13, 1975.  Richard Mendoza is being seen in follow up after being seen in consultation for management of currently uncontrolled symptomatic diabetes requested by  Carles Seltzer, MD.  He previously saw Endocrinology at [facility] but he notes his previous provider left the practice without much notice.   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    Past Surgical History:  Procedure Laterality Date   AMPUTATION Left 11/07/2020   Procedure: 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;  Surgeon: Neill Boas, DPM;  Location: [facility];  Service: Podiatry;  Laterality: Left;   AMPUTATION Left 11/16/2020   Procedure: AMPUTATION BELOW KNEE;  Surgeon: Marea Selinda RAMAN, MD;  Location: [facility];  Service: Vascular;  Laterality: Left;   AMPUTATION Left 01/29/2024   Procedure: AMPUTATION, ABOVE KNEE;  Surgeon: Marea Selinda RAMAN, MD;  Location: [facility];  Service: General;  Laterality: Left;   ANTERIOR CERVICAL DECOMP/DISCECTOMY FUSION N/A 09/09/2017   Procedure: ANTERIOR CERVICAL DECOMPRESSION/DISCECTOMY FUSION CERVICAL 6- CERVICAL 7;  Surgeon: Gillie Duncans, MD;  Location: [facility];  Service: Neurosurgery;  Laterality: N/A;  ANTERIOR CERVICAL DECOMPRESSION/DISCECTOMY FUSION CERVICAL 6- CERVICAL 7   APPENDECTOMY     I & D EXTREMITY Right 10/03/2017   Procedure: IRRIGATION AND DEBRIDEMENT RIGHT WRIST;  Surgeon: Murrell Drivers, MD;  Location: [facility];  Service: Orthopedics;  Laterality: Right;   I & D EXTREMITY Right 11/26/2018   Procedure: IRRIGATION AND DEBRIDEMENT FASCIA ON RIGHT FOOT;   Surgeon: Ashley Soulier, DPM;  Location: [facility];  Service: Podiatry;  Laterality: Right;   INCISION AND DRAINAGE Right 03/06/2019   Procedure: INCISION AND DRAINAGE RIGHT FOOT, WITH 4th RAY AMPUTATION;  Surgeon: Ashley Soulier, DPM;  Location: [facility];  Service: Podiatry;  Laterality: Right;   INCISION AND DRAINAGE Left 11/07/2020   Procedure: INCISION AND DRAINAGE;  Surgeon: Neill Boas, DPM;  Location: [facility];  Service: Podiatry;  Laterality: Left;   INCISION AND DRAINAGE ABSCESS Left 05/02/2023   Procedure: INCISION AND DRAINAGE ABSCESS LEFT LOWER EXTREMITY;  Surgeon: Marea Selinda RAMAN, MD;  Location: [facility];  Service: Vascular;  Laterality: Left;   INCISION AND DRAINAGE ABSCESS N/A 06/17/2024   Procedure: INCISION AND DRAINAGE, SCROTAL ABSCESS;  Surgeon: Francisca Redell BROCKS, MD;  Location: [facility];  Service: Urology;  Laterality: N/A;  scrotal   IRRIGATION AND DEBRIDEMENT FOOT Left 11/11/2020   Procedure: IRRIGATION AND DEBRIDEMENT FOOT;  Surgeon: Neill Boas, DPM;  Location: [facility];  Service: Podiatry;  Laterality: Left;   METATARSAL HEAD EXCISION Right 05/15/2019   Procedure: OSTECTOMY;MET HEAD 5;  Surgeon: Ashley Soulier, DPM;  Location: [facility];  Service: Podiatry;  Laterality: Right;   osteomylitis     ROTATOR CUFF REPAIR Left    SHOULDER ARTHROSCOPY WITH DEBRIDEMENT AND BICEP TENDON REPAIR Right  08/27/2023   Procedure: SHOULDER ARTHROSCOPY WITH INCISION AND DRAINAGE;  Surgeon: Tobie Priest, MD;  Location: [facility];  Service: Orthopedics;  Laterality: Right;    Social History   Socioeconomic History   Marital status: Married    Spouse name: Not on file   Number of children: 4   Years of education: Not on file   Highest education level: Not on file  Occupational History   Not on file  Tobacco Use   Smoking status: Former    Current packs/day: 0.00    Average packs/day: 1 pack/day for 17.0 years (17.0 ttl pk-yrs)    Types: Cigarettes    Start date: 01/14/2002    Quit date: 2024     Years since quitting: 1.8   Smokeless tobacco: Never  Vaping Use   Vaping status: Never Used  Substance and Sexual Activity   Alcohol use: Not Currently    Comment: rare   Drug use: No   Sexual activity: Yes  Other Topics Concern   Not on file  Social History Narrative   Not on file   Social Drivers of Health   Financial Resource Strain: Low Risk  (07/24/2022)   Received from [facility]   Overall Financial Resource Strain (CARDIA)    Difficulty of Paying Living Expenses: Not hard at all  Food Insecurity: No Food Insecurity (06/14/2024)   Hunger Vital Sign    Worried About Running Out of Food in the Last Year: Never true    Ran Out of Food in the Last Year: Never true  Transportation Needs: Unmet Transportation Needs (06/14/2024)   PRAPARE - Transportation    Lack of Transportation (Medical): Yes    Lack of Transportation (Non-Medical): Yes  Physical Activity: Not on file  Stress: No Stress Concern Present (07/24/2022)   Received from [facility] - Occupational Stress Questionnaire    Feeling of Stress : Not at all  Social Connections: Socially Isolated (06/14/2024)   Social Connection and Isolation Panel    Frequency of Communication with Friends and Family: Never    Frequency of Social Gatherings with Friends and Family: Never    Attends Religious Services: Never    Database Administrator or Organizations: Patient declined    Attends Banker Meetings: Never    Marital Status: Married    Family History  Problem Relation Age of Onset   Diabetes Mother    Heart disease Father    Diabetes Father    Arthritis Other    Hyperlipidemia Other    Hypertension Other    Cancer Other        breast   Mental illness Neg Hx     Outpatient Encounter Medications as of 09/23/2024  Medication Sig   cyanocobalamin  (VITAMIN B12) 1000 MCG tablet Take 1,000 mcg by mouth daily.   ELIQUIS  5 MG TABS tablet Take 5 mg by mouth 2  (two) times daily.   fenofibrate  54 MG tablet Take 1 tablet (54 mg total) by mouth daily.   ferrous sulfate  325 (65 FE) MG tablet Take 325 mg by mouth daily with breakfast.   hydrALAZINE  (APRESOLINE ) 50 MG tablet Take 1 tablet (50 mg total) by mouth every 8 (eight) hours.   HYDROmorphone  (DILAUDID ) 2 MG tablet Take 0.5-1 tablets (1-2 mg total) by mouth every 4 (four) hours as needed for severe pain (pain score 7-10).   hydrOXYzine  (ATARAX ) 25 MG tablet Take 25 mg by mouth every 8 (  eight) hours.   Insulin  Disposable Pump (OMNIPOD 5 [id number] PODS GEN 5) MISC Change pod every 48-72 hours as directed.   INSULIN  SYRINGE .5CC/29G 29G X 1/2 0.5 ML MISC Use to inject insulin  4 times daily   methocarbamol  (ROBAXIN ) 500 MG tablet Take 1 tablet (500 mg total) by mouth every 8 (eight) hours as needed for muscle spasms.   ondansetron  (ZOFRAN ) 8 MG tablet Take 8 mg by mouth every 8 (eight) hours as needed for nausea or vomiting.   pantoprazole  (PROTONIX ) 40 MG tablet Take 40 mg by mouth daily.   pregabalin  (LYRICA ) 75 MG capsule Take 1 capsule (75 mg total) by mouth 3 (three) times daily.   sertraline  (ZOLOFT ) 100 MG tablet Take 100 mg by mouth daily.   traZODone  (DESYREL ) 100 MG tablet Take 100 mg by mouth at bedtime.   Vitamin D , Ergocalciferol , (DRISDOL ) 1.25 MG (50000 UNIT) CAPS capsule Take 1 capsule (50,000 Units total) by mouth every 7 (seven) days.   [DISCONTINUED] Continuous Glucose Sensor (DEXCOM G7 SENSOR) MISC Inject 1 Application into the skin as directed. Change sensor every 10 days as directed.   [DISCONTINUED] insulin  aspart (NOVOLOG ) 100 UNIT/ML injection Inject 10-16 Units into the skin 3 (three) times daily with meals. 90-150= 10 units; 151-200= 11 units; 201-250= 12 units; 251-300= 13 units; 301-350= 14 units; 351-400= 15 units; 401 or higher = 16 units   acetaminophen  (TYLENOL ) 500 MG tablet Take 1 tablet (500 mg total) by mouth 3 (three) times daily.   amLODipine  (NORVASC ) 10 MG tablet Take  1 tablet (10 mg total) by mouth daily.   amoxicillin -clavulanate (AUGMENTIN ) 500-125 MG tablet Take 1 tablet by mouth 2 (two) times daily.   ascorbic acid  (VITAMIN C ) 500 MG tablet Take 1 tablet (500 mg total) by mouth daily.   atorvastatin  (LIPITOR) 40 MG tablet Take 40 mg by mouth daily.   chlorhexidine  (HIBICLENS ) 4 % external liquid Apply topically daily as needed.   Continuous Glucose Sensor (DEXCOM G7 SENSOR) MISC Inject 1 Application into the skin as directed. Change sensor every 10 days as directed.   insulin  aspart (NOVOLOG ) 100 UNIT/ML injection Use with Omnipod for TDD around 100   [DISCONTINUED] ciprofloxacin  (CIPRO ) 500 MG tablet Take 500 mg by mouth 2 (two) times daily. (Patient not taking: Reported on 07/31/2024)   [DISCONTINUED] famotidine  (PEPCID ) 10 MG tablet Take 10 mg by mouth 2 (two) times daily. (Patient not taking: Reported on 09/23/2024)   [DISCONTINUED] Insulin  Glargine (BASAGLAR  KWIKPEN) 100 UNIT/ML Inject 40 Units into the skin at bedtime. (Patient not taking: Reported on 09/23/2024)   [DISCONTINUED] metoCLOPramide  (REGLAN ) 10 MG tablet 1 tab(s) orally 4 times a day (before meals and at bedtime); Duration: 30 (Patient not taking: Reported on 09/23/2024)   No facility-administered encounter medications on file as of 09/23/2024.    ALLERGIES: Allergies  Allergen Reactions   Oxycodone  Itching   Codeine Itching    VACCINATION STATUS: Immunization History  Administered Date(s) Administered   Hep B, Unspecified 01/20/2002   PPD Test 02/14/2024, 02/14/2024, 02/27/2024, 02/27/2024   Tdap 07/29/2016, 07/23/2018, 11/25/2018    Diabetes He presents for his follow-up diabetic visit. He has type 2 diabetes mellitus. Onset time: diagnosed at approx age of 17. His disease course has been improving. There are no hypoglycemic associated symptoms. Associated symptoms include blurred vision, fatigue, foot paresthesias, polydipsia and polyuria. There are no hypoglycemic  complications. Symptoms are improving. Diabetic complications include nephropathy, peripheral neuropathy and PVD (hx Left AKA). Risk factors for coronary  artery disease include diabetes mellitus, dyslipidemia, male sex, hypertension, tobacco exposure, sedentary lifestyle and family history. Current diabetic treatment includes insulin  pump. He is compliant with treatment most of the time. His weight is fluctuating minimally. He is following a generally unhealthy diet. When asked about meal planning, he reported none. He has not had a previous visit with a dietitian. He participates in exercise intermittently. His home blood glucose trend is decreasing steadily. His overall blood glucose range is 130-140 mg/dl. (He presents today, accompanied by family, with his CGM and Omnipod 5 showing improving, mostly at goal glycemic profile.  His POCT A1c today is 7.1%, improving from last A1c of 9.2%.  Analysis of his CGM shows TIR 90%, TAR 10%, TBR 0% with a GMI of 7.6%.  He was released from rehab on Monday, started using his Omnipod then.  ) An ACE inhibitor/angiotensin II receptor blocker is being taken. He sees a podiatrist.Eye exam is current.     Review of systems  Constitutional: + increasing body weight, current Body mass index is 33.2 kg/m., no fatigue, no subjective hyperthermia, no subjective hypothermia Eyes: no blurry vision, no xerophthalmia ENT: no sore throat, no nodules palpated in throat, no dysphagia/odynophagia, no hoarseness Cardiovascular: no chest pain, no shortness of breath, no palpitations, no leg swelling Respiratory: no cough, no shortness of breath Gastrointestinal: no nausea/vomiting/diarrhea Musculoskeletal: no muscle/joint aches, hx L AKA- uses crutches now that he has his prosthesis Skin: no rashes, no hyperemia Neurological: no tremors, no numbness, no tingling, no dizziness Psychiatric: no depression, no anxiety  Objective:     BP 136/84 (BP Location: Left Arm, Patient  Position: Sitting, Cuff Size: Large)   Pulse 76   Ht 5' 10 (1.778 m)   Wt 231 lb 6.4 oz (105 kg)   BMI 33.20 kg/m   Wt Readings from Last 3 Encounters:  09/23/24 231 lb 6.4 oz (105 kg)  07/13/24 203 lb 11.2 oz (92.4 kg)  06/25/24 196 lb 13.9 oz (89.3 kg)     BP Readings from Last 3 Encounters:  09/23/24 136/84  07/31/24 (!) 168/88  07/13/24 (!) 148/83     Physical Exam- Limited  Constitutional:  Body mass index is 33.2 kg/m. , not in acute distress, normal state of mind Eyes:  EOMI, no exophthalmos Musculoskeletal: L-AKA- uses prosthesis and crutches, strength intact in all four extremities, no gross restriction of joint movements Skin:  no rashes, no hyperemia Neurological: no tremor with outstretched hands   Diabetic Foot Exam - Simple   No data filed      CMP ( most recent) CMP     Component Value Date/Time   NA 136 07/02/2024 1201   K 4.7 07/02/2024 1201   CL 110 07/02/2024 1201   CO2 20 (L) 07/02/2024 1201   GLUCOSE 215 (H) 07/02/2024 1201   BUN 25 (H) 07/02/2024 1201   CREATININE 2.34 (H) 07/02/2024 1201   CALCIUM  9.1 07/02/2024 1201   PROT 7.2 07/02/2024 1201   ALBUMIN 3.4 (L) 07/02/2024 1201   AST 22 07/02/2024 1201   ALT 16 07/02/2024 1201   ALKPHOS 76 07/02/2024 1201   BILITOT 0.8 07/02/2024 1201   GFR 118.24 10/24/2011 1654   GFRNONAA 33 (L) 07/02/2024 1201     Diabetic Labs (most recent): Lab Results  Component Value Date   HGBA1C 7.1 (A) 09/23/2024   HGBA1C 9.2 (H) 05/25/2024   HGBA1C 13.6 (H) 01/24/2024   MICROALBUR 2.2 (H) 06/02/2010     Lipid Panel ( most  recent) Lipid Panel     Component Value Date/Time   CHOL 273 (H) 10/24/2011 1654   TRIG 827.0 (H) 10/24/2011 1654   HDL 35.90 (L) 10/24/2011 1654   CHOLHDL 8 10/24/2011 1654   VLDL 165.4 (H) 10/24/2011 1654   LDLDIRECT 130.2 10/24/2011 1654      Lab Results  Component Value Date   TSH 0.581 05/23/2024           Assessment & Plan:   1) Type 2 diabetes mellitus  with hyperglycemia, with long-term current use of insulin  (HCC) (Primary)  He presents today, accompanied by family, with his CGM and Omnipod 5 showing improving, mostly at goal glycemic profile.  His POCT A1c today is 7.1%, improving from last A1c of 9.2%.  Analysis of his CGM shows TIR 90%, TAR 10%, TBR 0% with a GMI of 7.6%.  He was released from rehab on Monday, started using his Omnipod then.    - Richard Hamilton Rothschild has currently uncontrolled symptomatic type 2 DM since 48 years of age.   -Recent labs reviewed.  - I had a long discussion with him about the progressive nature of diabetes and the pathology behind its complications. -his diabetes is complicated by PVD (Hx L-AKA), CKD stage 3b, neuropathy and he remains at a high risk for more acute and chronic complications which include CAD, CVA, CKD, retinopathy, and neuropathy. These are all discussed in detail with him.  The following Lifestyle Medicine recommendations according to American College of Lifestyle Medicine [facility]) were discussed and offered to patient and he agrees to start the journey:  A. Whole Foods, Plant-based plate comprising of fruits and vegetables, plant-based proteins, whole-grain carbohydrates was discussed in detail with the patient.   A list for source of those nutrients were also provided to the patient.  Patient will use only water  or unsweetened tea for hydration. B.  The need to stay away from risky substances including alcohol, smoking; obtaining 7 to 9 hours of restorative sleep, at least 150 minutes of moderate intensity exercise weekly, the importance of healthy social connections,  and stress reduction techniques were discussed. C.  A full color page of  Calorie density of various food groups per pound showing examples of each food groups was provided to the patient.  - Nutritional counseling repeated/built upon at each appointment.  - The patient admits there is a room for improvement in their diet and  drink choices. -  Suggestion is made for the patient to avoid simple carbohydrates from their diet including Cakes, Sweet Desserts / Pastries, Ice Cream, Soda (diet and regular), Sweet Tea, Candies, Chips, Cookies, Sweet Pastries, Store Bought Juices, Alcohol in Excess of 1-2 drinks a day, Artificial Sweeteners, Coffee Creamer, and Sugar-free Products. This will help patient to have stable blood glucose profile and potentially avoid unintended weight gain.   - I encouraged the patient to switch to unprocessed or minimally processed complex starch and increased protein intake (animal or plant source), fruits, and vegetables.   - Patient is advised to stick to a routine mealtimes to eat 3 meals a day and avoid unnecessary snacks (to snack only to correct hypoglycemia).  - I have approached him with the following individualized plan to manage his diabetes and patient agrees:   -I adjusted his max basal rate to 5 units per hour (although I doubt it will ever get this high) and adjusted his correction factor to 25 instead of 30 to help calculate a bit more insulin  per bolus.  I sent in refill for his insulin  to the pharmacy.  -he is encouraged to start/continue monitoring glucose 4 times daily, before meals and before bed (using his CGM), and to call the clinic if he has readings less than 70 or above 300 for 3 tests in a row.  Gave him 2 samples of Dexcom G7 today as he is currently waiting on PA.  - he is warned not to take insulin  without proper monitoring per orders.46 - Adjustment parameters are given to him for hypo and hyperglycemia in writing.  - he is not a candidate for Metformin due to concurrent renal insufficiency.  He notes his nephrologist was discussing trying a different medication that may help with kidney function (perhaps Farxiga).  He was previously on Jardiance but was taken off around the same time as he was having osteomyelitis.  I do not feel that we need to initiate SGLT2i from  a diabetes perspective as it has weak A1c reduction properties.  Due to side effects and cost, will keep him off of this for now.  - he is not an ideal candidate for incretin therapy given body habitus (BMI less than 25) and heavy smoking history (quit in October 2024) which increases his risk of pancreatitis.  - Specific targets for  A1c; LDL, HDL, and Triglycerides were discussed with the patient.  2) Blood Pressure /Hypertension:  his blood pressure is controlled to target.   he is advised to continue his current medications as prescribed by his PCP.  3) Lipids/Hyperlipidemia:    There is no recent lipid panel available to review, nor does he appear to be on any statin medication.    4)  Weight/Diet:  his Body mass index is 33.2 kg/m.  -  he is NOT a candidate for weight loss.   Exercise, and detailed carbohydrates information provided  -  detailed on discharge instructions.  5) Chronic Care/Health Maintenance: -he is on ACEI/ARB and is not on Statin medications and is encouraged to initiate and continue to follow up with Ophthalmology, Dentist, Podiatrist at least yearly or according to recommendations, and advised to stay away from smoking. I have recommended yearly flu vaccine and pneumonia vaccine at least every 5 years; moderate intensity exercise for up to 150 minutes weekly; and sleep for at least 7 hours a day.  - he is advised to maintain close follow up with Zakhia, Karl, MD for primary care needs, as well as his other providers for optimal and coordinated care.    I spent  49  minutes in the care of the patient today including review of labs from CMP, Lipids, Thyroid Function, Hematology (current and previous including abstractions from other facilities); face-to-face time discussing  his blood glucose readings/logs, discussing hypoglycemia and hyperglycemia episodes and symptoms, medications doses, his options of short and long term treatment based on the latest standards of  care / guidelines;  discussion about incorporating lifestyle medicine;  and documenting the encounter. Risk reduction counseling performed per USPSTF guidelines to reduce obesity and cardiovascular risk factors.     Please refer to Patient Instructions for Blood Glucose Monitoring and Insulin /Medications Dosing Guide  in media tab for additional information. Please  also refer to  Patient Self Inventory in the Media  tab for reviewed elements of pertinent patient history.  Richard Mendoza participated in the discussions, expressed understanding, and voiced agreement with the above plans.  All questions were answered to his satisfaction. he is encouraged to contact clinic should he  have any questions or concerns prior to his return visit.     Follow up plan: - Return in about 3 months (around 12/24/2024) for Diabetes F/U with A1c in office, No previsit labs, Bring meter and logs.   Benton Rio, [facility] [address] [place], [place] [address] Phone: [phone] Fax: [phone]  09/23/2024, 10:35 AM

## 2024-09-24 NOTE — Telephone Encounter (Signed)
 Addressed by PA team on separate encounter

## 2024-09-29 ENCOUNTER — Ambulatory Visit (HOSPITAL_COMMUNITY): Admitting: Licensed Clinical Social Worker

## 2024-09-29 ENCOUNTER — Ambulatory Visit (INDEPENDENT_AMBULATORY_CARE_PROVIDER_SITE_OTHER): Admitting: Licensed Clinical Social Worker

## 2024-09-29 DIAGNOSIS — F411 Generalized anxiety disorder: Secondary | ICD-10-CM | POA: Diagnosis not present

## 2024-09-29 DIAGNOSIS — F332 Major depressive disorder, recurrent severe without psychotic features: Secondary | ICD-10-CM | POA: Diagnosis not present

## 2024-09-29 NOTE — Progress Notes (Signed)
 THERAPIST PROGRESS NOTE  Session Time: 11:00 AM to 11:45 AM  Participation Level: Active  Behavioral Response: CasualAlertmood swings recently tearful at times and session other times affect more bright  Type of Therapy: Individual Therapy  Treatment Goals addressed: Patient says medical is a big part of the issue not knowing what health is going to do from today. What going to be limited to and what able to do. Health and loss of limbs has caused other losses. Talk about losses and process and identify coping strategies that can help with these stressors. In general work on decreasing symptoms of anxiety and depression.    ProgressTowards Goals: Progressing-patient has a lot going on therapist continues to use positive reframing to help him with perspective helpful as well for patient as he stands to get things out in session to help with coping  Interventions: Solution Focused, Strength-based, Supportive, Reframing, and Other: coping  Summary: Richard Mendoza is a 48 y.o. male who presents with it is going using two canes. Waiting for OP nursing to come see him see what living day to day.  Therapist looking for some solutions hopefully they will give recommendations to his family that they might follow-up with.  They do not ask what he needs so hopefully a professional may be able to suggest some things that make his life a little easier.  At facility everything designed for somebody like him. Patient knows it will make a huge difference having his own place. Out of the condition he is living in now. Mood up and down the last two weeks. At facility was doing good had a routine at the facility now all of that thrown out the window. This is his 4th time applying for disability and this last time been under review since February. His is just sitting there and meantime patient is doing what supposed to do. Can't even get anyone on the phone.  Patient says with things but does not ask what he  needs where he is living therapist said particularly as he has medical issues with like him and asked people what they need. They are not difficult they just don't think.  Patient perspective his life worth living not there the more stay there worse he will be. Shared some positive that he will be getting more extensive training and therapy this December.  Relates it to him and his mom but he says it also he can see helps keep her going to take care of him also the church and grandkids. The church is there when need her. Plus because gets out goes to church two times Sunday and Wednesday. People at the church have taken her in since moved from [place] . Blessing for her that church is there to take care of her. They help her  with gas knows how much running she does.  Therapist as a therapeutic intervention noting how important it is for patient to have her own place said life is about change and patient says got to.  Therapist brought up wife where he sat with that reviewed background with her not just her separating he had a part to play when on pills after shoulder injury have to go back and on pills more made him extremely mean didn't see it this was in 2017 that along with losing her grandmother, mother and best friend and patient not being the emotional support she needed let to where they are now. She left found somebody wanted to see she would  be gone out.  Patient says thought she got glimpses not better out there including not having anyone there and struggle of doing things on own. One time comment and things going good another time sounds like looking patient says it goes from A to Z.  Therapist notes in relationships the flip-flopping is hard on the partner can validate and empathize would be better for was not like that can see why it is a struggle.  Therapist continues with positive reframe different ways to do that also identifying patient's strengths helping him with coping patient says talking  about things helps week to week things changing so much going on help to get things off chest.    Suicidal/Homicidal: No  Plan: Return again in 1 week.2.Patient coping with uncertainty finding positive coping steps that we will help with this helping to process thoughts and feelings in session help with coping  Diagnosis: Major depressive disorder, recurrent, severe generalized anxiety disorder   Collaboration of Care: Other none needed  Patient/Guardian was advised Release of Information must be obtained prior to any record release in order to collaborate their care with an outside provider. Patient/Guardian was advised if they have not already done so to contact the registration department to sign all necessary forms in order for us  to release information regarding their care.   Consent: Patient/Guardian gives verbal consent for treatment and assignment of benefits for services provided during this visit. Patient/Guardian expressed understanding and agreed to proceed.   [name], LCSW 09/29/2024

## 2024-10-06 ENCOUNTER — Ambulatory Visit (INDEPENDENT_AMBULATORY_CARE_PROVIDER_SITE_OTHER): Admitting: Licensed Clinical Social Worker

## 2024-10-06 DIAGNOSIS — F411 Generalized anxiety disorder: Secondary | ICD-10-CM | POA: Diagnosis not present

## 2024-10-06 DIAGNOSIS — F332 Major depressive disorder, recurrent severe without psychotic features: Secondary | ICD-10-CM | POA: Diagnosis not present

## 2024-10-06 NOTE — Progress Notes (Signed)
 THERAPIST PROGRESS NOTE  Session Time: 1:00 PM to 1:45 PM  Participation Level: Active  Behavioral Response: CasualAlertDysphoric and mood swings up and down  Type of Therapy: Individual Therapy  Treatment Goals addressed: Patient says medical is a big part of the issue not knowing what health is going to do from today. What going to be limited to and what able to do. Health and loss of limbs has caused other losses. Talk about losses and process and identify coping strategies that can help with these stressors. In general work on decreasing symptoms of anxiety and depression.    ProgressTowards Goals: Progressing-patient has significant stressors as patient himself says helpful to get that out in session so does not build up also therapist provides perspective that will help with coping  Interventions: Solution Focused, Strength-based, Supportive, and Other: coping  Summary: Richard Mendoza is a 48 y.o. male who presents with coming in on crutches therapist impressed only seeing him in wheelchair although it is a long walk to the office.  Patient says recovery going good wishing going faster but has to take time body has to get used to it. Supposed to follow up and it already has been three weeks.  Therapist encouraged and patient realizes this will need to call somebody just has to figure out who.  Patient hopes they will see his condition and have other options. Most of life impatience get work done past two years a lot of patience at a point don't want to have patience. Therapist noted there is some good in being impatient and pushing to makes things happen but something things are just of our control. Therapist asked how is his mental health? Not as good as when left facility good lack of motion nothing to drive him. Don't get the exercise he thinks he should. Not get a true step in where he lives. Afraid will lose muscles if don't get int therapy when left felt strong and ready to  continue three weeks and nothing is happening only really takes stops when go to doctor. Sleeping in recliner causes problems with sleep. Rabbit making noise all night, four cats at night. Best sleep from 8 AM when nobody there. Want to sleep so tired and can't sleep. Rough but therapist continues with what she believes to be the truth hang in there and it will change. Patient doing what he can the best he can do doesn't want to fuss don't want to start argument. Fighting this battle issues to overcome with health, body wise sitting int the same spot at then that is the frustrating part if this was not first go around.  Therapist also helps to come here and he said if that is he says sit there every day build up in mind come here and get it out so not building so doesn't say something to create more of problem. Don't think adjustments they need to make what he needs. Patient says they don't give him a second thought his frustration to to get to kitchen to get a bowl of cereal. Had one with lid and it went missing put it somewhere can't find it little things like that how it is. Patient getting stuff from kitchen to chair don't understand how hard when it is easy for them.  Looking for the pluses opening the side door open. Fresh air find little things that make it a little better and easier.  Therapist noted she was trying to do that as well for  example asking about church. Patient says sees people who care some about his situation is in.  Patient is better with kids being rambunctious Don't run outside so understand why stir crazy. They are being kids. One 4 about to be 5 grandson, the other boy 7, little girl 3 or 4. From 5:30 - 8 PM don't do anything sit and watch them. Put the kids in the little space is why they are hard to manage.  Blame on himself taught boys patient can do everything and independent and now can't and why they might not see it that way.  Talking about the house again therapist adjusting  say something about his needs and patient saying do not want to say anything that would cause them to change wants to show very for further hospitality and not do something that would change them wanting to be with him Talking about holidays both therapist and patient brought up positive memories been helpful for affect.  Patient says hope to get more involved.  Therapist encouraged him with involvement even with walking with crutches patient says can but not what he wants to do with therapist understands that important how patient wants to experience things.  Patient says never thought recovery would take this long remembers the first time but never thought it would be this long. Therapist noted these challenges take everything we got but again insisting from her own experience willpower and determination gets this through it learning that that it is not an easy thing and therapist feels got some help from somewhere and patient himself spiritual so encouraged him to Into this resource.  Nice as well to hear little bit about patient's conditions such as him being important part of things getting deep fries the turkey and says that is the best way to cook it.  In general therapist provided support and space for patient to talk about thoughts and feelings in session.  Suicidal/Homicidal: No  Plan: Return again in 1 week.2.Patient coping with uncertainty finding positive coping steps that we will help with this helping to process thoughts and feelings in session help with coping  Diagnosis: Major depressive disorder, recurrent, severe generalized anxiety disorder  Collaboration of Care: Other none needed  Patient/Guardian was advised Release of Information must be obtained prior to any record release in order to collaborate their care with an outside provider. Patient/Guardian was advised if they have not already done so to contact the registration department to sign all necessary forms in order for us  to  release information regarding their care.   Consent: Patient/Guardian gives verbal consent for treatment and assignment of benefits for services provided during this visit. Patient/Guardian expressed understanding and agreed to proceed.   [name], LCSW 10/06/2024

## 2024-10-12 ENCOUNTER — Encounter (INDEPENDENT_AMBULATORY_CARE_PROVIDER_SITE_OTHER)

## 2024-10-12 ENCOUNTER — Ambulatory Visit (INDEPENDENT_AMBULATORY_CARE_PROVIDER_SITE_OTHER): Admitting: Nurse Practitioner

## 2024-10-13 ENCOUNTER — Ambulatory Visit (INDEPENDENT_AMBULATORY_CARE_PROVIDER_SITE_OTHER): Admitting: Licensed Clinical Social Worker

## 2024-10-13 DIAGNOSIS — F411 Generalized anxiety disorder: Secondary | ICD-10-CM

## 2024-10-13 DIAGNOSIS — F332 Major depressive disorder, recurrent severe without psychotic features: Secondary | ICD-10-CM

## 2024-10-13 NOTE — Progress Notes (Signed)
 Patient did not show for appointment

## 2024-10-20 ENCOUNTER — Encounter (HOSPITAL_COMMUNITY): Payer: Self-pay | Admitting: Licensed Clinical Social Worker

## 2024-10-20 ENCOUNTER — Ambulatory Visit (INDEPENDENT_AMBULATORY_CARE_PROVIDER_SITE_OTHER): Admitting: Licensed Clinical Social Worker

## 2024-10-20 DIAGNOSIS — F411 Generalized anxiety disorder: Secondary | ICD-10-CM

## 2024-10-20 DIAGNOSIS — F332 Major depressive disorder, recurrent severe without psychotic features: Secondary | ICD-10-CM

## 2024-10-20 NOTE — Progress Notes (Signed)
 Patient did not show for appointment

## 2024-11-02 ENCOUNTER — Ambulatory Visit (HOSPITAL_COMMUNITY): Admitting: Licensed Clinical Social Worker

## 2024-11-04 ENCOUNTER — Ambulatory Visit (HOSPITAL_COMMUNITY): Admitting: Licensed Clinical Social Worker

## 2024-11-09 ENCOUNTER — Encounter (INDEPENDENT_AMBULATORY_CARE_PROVIDER_SITE_OTHER): Payer: Self-pay

## 2024-11-10 ENCOUNTER — Other Ambulatory Visit (INDEPENDENT_AMBULATORY_CARE_PROVIDER_SITE_OTHER): Payer: Self-pay | Admitting: Nurse Practitioner

## 2024-11-10 DIAGNOSIS — Z89612 Acquired absence of left leg above knee: Secondary | ICD-10-CM

## 2024-11-10 NOTE — Telephone Encounter (Signed)
 Richard Mendoza, I put the referral in, can you perhaps fax it over to them and make sure they have it

## 2024-11-18 ENCOUNTER — Other Ambulatory Visit (HOSPITAL_COMMUNITY): Payer: Self-pay

## 2024-11-20 ENCOUNTER — Other Ambulatory Visit: Payer: Self-pay

## 2024-11-30 ENCOUNTER — Telehealth: Payer: Self-pay | Admitting: Nurse Practitioner

## 2024-11-30 NOTE — Telephone Encounter (Signed)
 Oh no!  I hate to hear that.  Thanks for letting me know.

## 2024-11-30 NOTE — Telephone Encounter (Signed)
 Wife called and said that he passed away on 01-Dec-2024

## 2024-12-20 DEATH — deceased

## 2024-12-29 ENCOUNTER — Ambulatory Visit: Admitting: Nurse Practitioner

## 2025-01-28 ENCOUNTER — Encounter (INDEPENDENT_AMBULATORY_CARE_PROVIDER_SITE_OTHER)

## 2025-01-28 ENCOUNTER — Ambulatory Visit (INDEPENDENT_AMBULATORY_CARE_PROVIDER_SITE_OTHER): Admitting: Vascular Surgery
